# Patient Record
Sex: Male | Born: 1980
Health system: Southern US, Community
[De-identification: ages and names within clinical notes are randomized; demographics above are authoritative.]

---

## 1999-04-25 ENCOUNTER — Emergency Department (HOSPITAL_COMMUNITY): Admission: EM | Admit: 1999-04-25 | Discharge: 1999-04-25 | Payer: Self-pay | Admitting: Emergency Medicine

## 2008-03-07 ENCOUNTER — Emergency Department (HOSPITAL_COMMUNITY): Admission: EM | Admit: 2008-03-07 | Discharge: 2008-03-07 | Payer: Self-pay | Admitting: Emergency Medicine

## 2008-04-14 ENCOUNTER — Emergency Department (HOSPITAL_COMMUNITY): Admission: EM | Admit: 2008-04-14 | Discharge: 2008-04-14 | Payer: Self-pay | Admitting: Emergency Medicine

## 2009-04-04 ENCOUNTER — Emergency Department (HOSPITAL_COMMUNITY): Admission: EM | Admit: 2009-04-04 | Discharge: 2009-04-05 | Payer: Self-pay | Admitting: Emergency Medicine

## 2011-04-08 LAB — URINE MICROSCOPIC-ADD ON

## 2011-04-08 LAB — POCT I-STAT, CHEM 8
BUN: 15 mg/dL (ref 6–23)
Calcium, Ion: 1.12 mmol/L (ref 1.12–1.32)
Creatinine, Ser: 1.4 mg/dL (ref 0.4–1.5)
Glucose, Bld: 93 mg/dL (ref 70–99)
Hemoglobin: 16 g/dL (ref 13.0–17.0)
TCO2: 28 mmol/L (ref 0–100)

## 2011-04-08 LAB — URINALYSIS, ROUTINE W REFLEX MICROSCOPIC
Bilirubin Urine: NEGATIVE
Ketones, ur: NEGATIVE mg/dL
Specific Gravity, Urine: 1.019 (ref 1.005–1.030)
Urobilinogen, UA: 1 mg/dL (ref 0.0–1.0)
pH: 6.5 (ref 5.0–8.0)

## 2011-04-08 LAB — RAPID URINE DRUG SCREEN, HOSP PERFORMED
Amphetamines: NOT DETECTED
Barbiturates: NOT DETECTED
Benzodiazepines: NOT DETECTED
Cocaine: NOT DETECTED
Opiates: NOT DETECTED
Tetrahydrocannabinol: NOT DETECTED

## 2012-06-05 ENCOUNTER — Encounter (HOSPITAL_COMMUNITY): Payer: Self-pay | Admitting: Emergency Medicine

## 2012-06-05 ENCOUNTER — Emergency Department (HOSPITAL_COMMUNITY)
Admission: EM | Admit: 2012-06-05 | Discharge: 2012-06-05 | Disposition: A | Discharge status: Home / Self Care | Payer: Self-pay | Attending: Emergency Medicine | Admitting: Emergency Medicine

## 2012-06-05 DIAGNOSIS — IMO0002 Reserved for concepts with insufficient information to code with codable children: Secondary | ICD-10-CM | POA: Insufficient documentation

## 2012-06-05 DIAGNOSIS — Y998 Other external cause status: Secondary | ICD-10-CM | POA: Insufficient documentation

## 2012-06-05 DIAGNOSIS — Y9239 Other specified sports and athletic area as the place of occurrence of the external cause: Secondary | ICD-10-CM | POA: Insufficient documentation

## 2012-06-05 DIAGNOSIS — S0093XA Contusion of unspecified part of head, initial encounter: Secondary | ICD-10-CM

## 2012-06-05 DIAGNOSIS — W010XXA Fall on same level from slipping, tripping and stumbling without subsequent striking against object, initial encounter: Secondary | ICD-10-CM | POA: Insufficient documentation

## 2012-06-05 DIAGNOSIS — S51809A Unspecified open wound of unspecified forearm, initial encounter: Secondary | ICD-10-CM | POA: Insufficient documentation

## 2012-06-05 DIAGNOSIS — T07XXXA Unspecified multiple injuries, initial encounter: Secondary | ICD-10-CM

## 2012-06-05 DIAGNOSIS — S0003XA Contusion of scalp, initial encounter: Secondary | ICD-10-CM | POA: Insufficient documentation

## 2012-06-05 MED ORDER — HYDROCODONE-ACETAMINOPHEN 5-325 MG PO TABS: 2.0000 | ORAL_TABLET | ORAL | Status: AC | PRN

## 2012-06-05 MED ORDER — HYDROCODONE-ACETAMINOPHEN 5-325 MG PO TABS
1.0000 | ORAL_TABLET | Freq: Once | ORAL | Status: AC
  Administered 2012-06-05: 1 via ORAL
  Filled 2012-06-05: qty 1

## 2012-06-05 NOTE — Discharge Instructions (Signed)
Abrasions Abrasions are skin scrapes. Their treatment depends on how large and deep the abrasion is. Abrasions do not extend through all layers of the skin. A cut or lesion through all skin layers is called a laceration. HOME CARE INSTRUCTIONS   If you were given a dressing, change it at least once a day or as instructed by your caregiver. If the bandage sticks, soak it off with a solution of water or hydrogen peroxide.   Twice a day, wash the area with soap and water to remove all the cream/ointment. You may do this in a sink, under a tub faucet, or in a shower. Rinse off the soap and pat dry with a clean towel. Look for signs of infection (see below).   Reapply cream/ointment according to your caregiver's instruction. This will help prevent infection and keep the bandage from sticking. Telfa or gauze over the wound and under the dressing or wrap will also help keep the bandage from sticking.   If the bandage becomes wet, dirty, or develops a foul smell, change it as soon as possible.   Only take over-the-counter or prescription medicines for pain, discomfort, or fever as directed by your caregiver.  SEEK IMMEDIATE MEDICAL CARE IF:   Increasing pain in the wound.   Signs of infection develop: redness, swelling, surrounding area is tender to touch, or pus coming from the wound.   You have a fever.   Any foul smell coming from the wound or dressing.  Most skin wounds heal within ten days. Facial wounds heal faster. However, an infection may occur despite proper treatment. You should have the wound checked for signs of infection within 24 to 48 hours or sooner if problems arise. If you were not given a wound-check appointment, look closely at the wound yourself on the second day for early signs of infection listed above. MAKE SURE YOU:   Understand these instructions.   Will watch your condition.   Will get help right away if you are not doing well or get worse.  Document Released:  09/23/2005 Document Revised: 12/03/2011 Document Reviewed: 11/17/2011 [facility] Patient Information 2012 [place].Blunt Trauma You have been evaluated for injuries. You have been examined and your caregiver has not found injuries serious enough to require hospitalization. It is common to have multiple bruises and sore muscles following an accident. These tend to feel worse for the first 24 hours. You will feel more stiffness and soreness over the next several hours and worse when you wake up the first morning after your accident. After this point, you should begin to improve with each passing day. The amount of improvement depends on the amount of damage done in the accident. Following your accident, if some part of your body does not work as it should, or if the pain in any area continues to increase, you should return to the Emergency Department for re-evaluation.  HOME CARE INSTRUCTIONS  Routine care for sore areas should include:  Ice to sore areas every 2 hours for 20 minutes while awake for the next 2 days.   Drink extra fluids (not alcohol).   Take a hot or warm shower or bath once or twice a day to increase blood flow to sore muscles. This will help you "limber up".   Activity as tolerated. Lifting may aggravate neck or back pain.   Only take over-the-counter or prescription medicines for pain, discomfort, or fever as directed by your caregiver. Do not use aspirin. This may increase bruising or  increase bleeding if there are small areas where this is happening.  SEEK IMMEDIATE MEDICAL CARE IF:  Numbness, tingling, weakness, or problem with the use of your arms or legs.   A severe headache is not relieved with medications.   There is a change in bowel or bladder control.   Increasing pain in any areas of the body.   Short of breath or dizzy.   Nauseated, vomiting, or sweating.   Increasing belly (abdominal) discomfort.   Blood in urine, stool, or vomiting blood.   Pain  in either shoulder in an area where a shoulder strap would be.   Feelings of lightheadedness or if you have a fainting episode.  Sometimes it is not possible to identify all injuries immediately after the trauma. It is important that you continue to monitor your condition after the emergency department visit. If you feel you are not improving, or improving more slowly than should be expected, call your physician. If you feel your symptoms (problems) are worsening, return to the Emergency Department immediately. Document Released: 09/09/2001 Document Revised: 12/03/2011 Document Reviewed: 08/01/2008 [facility] Patient Information 2012 [place].Blunt Trauma You have been evaluated for injuries. You have been examined and your caregiver has not found injuries serious enough to require hospitalization. It is common to have multiple bruises and sore muscles following an accident. These tend to feel worse for the first 24 hours. You will feel more stiffness and soreness over the next several hours and worse when you wake up the first morning after your accident. After this point, you should begin to improve with each passing day. The amount of improvement depends on the amount of damage done in the accident. Following your accident, if some part of your body does not work as it should, or if the pain in any area continues to increase, you should return to the Emergency Department for re-evaluation.  HOME CARE INSTRUCTIONS  Routine care for sore areas should include:  Ice to sore areas every 2 hours for 20 minutes while awake for the next 2 days.   Drink extra fluids (not alcohol).   Take a hot or warm shower or bath once or twice a day to increase blood flow to sore muscles. This will help you "limber up".   Activity as tolerated. Lifting may aggravate neck or back pain.   Only take over-the-counter or prescription medicines for pain, discomfort, or fever as directed by your caregiver. Do not use  aspirin. This may increase bruising or increase bleeding if there are small areas where this is happening.  SEEK IMMEDIATE MEDICAL CARE IF:  Numbness, tingling, weakness, or problem with the use of your arms or legs.   A severe headache is not relieved with medications.   There is a change in bowel or bladder control.   Increasing pain in any areas of the body.   Short of breath or dizzy.   Nauseated, vomiting, or sweating.   Increasing belly (abdominal) discomfort.   Blood in urine, stool, or vomiting blood.   Pain in either shoulder in an area where a shoulder strap would be.   Feelings of lightheadedness or if you have a fainting episode.  Sometimes it is not possible to identify all injuries immediately after the trauma. It is important that you continue to monitor your condition after the emergency department visit. If you feel you are not improving, or improving more slowly than should be expected, call your physician. If you feel your symptoms (problems) are  worsening, return to the Emergency Department immediately. Document Released: 09/09/2001 Document Revised: 12/03/2011 Document Reviewed: 08/01/2008 [facility] Patient Information 2012 [place]. Your sutures will need to be removed in 10 days.  She [facility] this year.  Given a prescription for pain medication .  Uses for severe pain otherwise, you can take over-the-counter Tylenol or ibuprofen
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
## 2012-06-05 NOTE — ED Provider Notes (Signed)
Medical screening examination/treatment/procedure(s) were performed by non-physician practitioner and as supervising physician I was immediately available for consultation/collaboration.   Hoy Morn, MD 06/05/12 2258
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
## 2012-06-05 NOTE — ED Notes (Signed)
ALP bedside to close wound

## 2019-09-09 ENCOUNTER — Emergency Department (HOSPITAL_COMMUNITY): Payer: Medicaid Other | Admitting: Anesthesiology

## 2019-09-09 ENCOUNTER — Emergency Department (HOSPITAL_COMMUNITY): Payer: Medicaid Other

## 2019-09-09 ENCOUNTER — Inpatient Hospital Stay (HOSPITAL_COMMUNITY)
Admission: EM | Admit: 2019-09-09 | Discharge: 2020-05-02 | DRG: 023 | Disposition: A | Discharge status: Skilled Nursing Facility | Payer: Medicaid Other | Attending: Internal Medicine | Admitting: Internal Medicine

## 2019-09-09 ENCOUNTER — Other Ambulatory Visit: Payer: Self-pay

## 2019-09-09 ENCOUNTER — Inpatient Hospital Stay (HOSPITAL_COMMUNITY): Payer: Medicaid Other

## 2019-09-09 ENCOUNTER — Encounter (HOSPITAL_COMMUNITY)
Admission: EM | Disposition: A | Discharge status: Skilled Nursing Facility | Payer: Self-pay | Source: Home / Self Care | Attending: Internal Medicine

## 2019-09-09 DIAGNOSIS — Z23 Encounter for immunization: Secondary | ICD-10-CM

## 2019-09-09 DIAGNOSIS — S065X9A Traumatic subdural hemorrhage with loss of consciousness of unspecified duration, initial encounter: Secondary | ICD-10-CM | POA: Diagnosis present

## 2019-09-09 DIAGNOSIS — N289 Disorder of kidney and ureter, unspecified: Secondary | ICD-10-CM | POA: Diagnosis not present

## 2019-09-09 DIAGNOSIS — K5900 Constipation, unspecified: Secondary | ICD-10-CM | POA: Diagnosis not present

## 2019-09-09 DIAGNOSIS — R5381 Other malaise: Secondary | ICD-10-CM | POA: Diagnosis not present

## 2019-09-09 DIAGNOSIS — J9602 Acute respiratory failure with hypercapnia: Secondary | ICD-10-CM | POA: Diagnosis present

## 2019-09-09 DIAGNOSIS — J9601 Acute respiratory failure with hypoxia: Secondary | ICD-10-CM | POA: Diagnosis present

## 2019-09-09 DIAGNOSIS — R569 Unspecified convulsions: Secondary | ICD-10-CM

## 2019-09-09 DIAGNOSIS — N179 Acute kidney failure, unspecified: Secondary | ICD-10-CM | POA: Diagnosis not present

## 2019-09-09 DIAGNOSIS — F1020 Alcohol dependence, uncomplicated: Secondary | ICD-10-CM | POA: Diagnosis present

## 2019-09-09 DIAGNOSIS — G932 Benign intracranial hypertension: Secondary | ICD-10-CM | POA: Diagnosis not present

## 2019-09-09 DIAGNOSIS — J95851 Ventilator associated pneumonia: Secondary | ICD-10-CM | POA: Diagnosis not present

## 2019-09-09 DIAGNOSIS — Z4659 Encounter for fitting and adjustment of other gastrointestinal appliance and device: Secondary | ICD-10-CM

## 2019-09-09 DIAGNOSIS — G8191 Hemiplegia, unspecified affecting right dominant side: Secondary | ICD-10-CM | POA: Diagnosis not present

## 2019-09-09 DIAGNOSIS — I639 Cerebral infarction, unspecified: Secondary | ICD-10-CM | POA: Diagnosis not present

## 2019-09-09 DIAGNOSIS — E162 Hypoglycemia, unspecified: Secondary | ICD-10-CM | POA: Diagnosis not present

## 2019-09-09 DIAGNOSIS — R4701 Aphasia: Secondary | ICD-10-CM | POA: Diagnosis present

## 2019-09-09 DIAGNOSIS — D62 Acute posthemorrhagic anemia: Secondary | ICD-10-CM | POA: Diagnosis present

## 2019-09-09 DIAGNOSIS — J9600 Acute respiratory failure, unspecified whether with hypoxia or hypercapnia: Secondary | ICD-10-CM

## 2019-09-09 DIAGNOSIS — I612 Nontraumatic intracerebral hemorrhage in hemisphere, unspecified: Secondary | ICD-10-CM | POA: Diagnosis not present

## 2019-09-09 DIAGNOSIS — F10239 Alcohol dependence with withdrawal, unspecified: Secondary | ICD-10-CM | POA: Diagnosis not present

## 2019-09-09 DIAGNOSIS — R4020 Unspecified coma: Secondary | ICD-10-CM | POA: Diagnosis present

## 2019-09-09 DIAGNOSIS — I629 Nontraumatic intracranial hemorrhage, unspecified: Secondary | ICD-10-CM | POA: Diagnosis present

## 2019-09-09 DIAGNOSIS — Z7189 Other specified counseling: Secondary | ICD-10-CM

## 2019-09-09 DIAGNOSIS — Z931 Gastrostomy status: Secondary | ICD-10-CM

## 2019-09-09 DIAGNOSIS — Z9911 Dependence on respirator [ventilator] status: Secondary | ICD-10-CM

## 2019-09-09 DIAGNOSIS — R739 Hyperglycemia, unspecified: Secondary | ICD-10-CM | POA: Diagnosis not present

## 2019-09-09 DIAGNOSIS — I129 Hypertensive chronic kidney disease with stage 1 through stage 4 chronic kidney disease, or unspecified chronic kidney disease: Secondary | ICD-10-CM | POA: Diagnosis present

## 2019-09-09 DIAGNOSIS — N136 Pyonephrosis: Secondary | ICD-10-CM | POA: Diagnosis not present

## 2019-09-09 DIAGNOSIS — Z515 Encounter for palliative care: Secondary | ICD-10-CM

## 2019-09-09 DIAGNOSIS — Z681 Body mass index (BMI) 19 or less, adult: Secondary | ICD-10-CM | POA: Diagnosis not present

## 2019-09-09 DIAGNOSIS — G936 Cerebral edema: Secondary | ICD-10-CM | POA: Diagnosis present

## 2019-09-09 DIAGNOSIS — J9690 Respiratory failure, unspecified, unspecified whether with hypoxia or hypercapnia: Secondary | ICD-10-CM

## 2019-09-09 DIAGNOSIS — E875 Hyperkalemia: Secondary | ICD-10-CM | POA: Diagnosis not present

## 2019-09-09 DIAGNOSIS — I161 Hypertensive emergency: Secondary | ICD-10-CM | POA: Diagnosis present

## 2019-09-09 DIAGNOSIS — R1310 Dysphagia, unspecified: Secondary | ICD-10-CM | POA: Diagnosis present

## 2019-09-09 DIAGNOSIS — I619 Nontraumatic intracerebral hemorrhage, unspecified: Secondary | ICD-10-CM

## 2019-09-09 DIAGNOSIS — R32 Unspecified urinary incontinence: Secondary | ICD-10-CM | POA: Diagnosis present

## 2019-09-09 DIAGNOSIS — B961 Klebsiella pneumoniae [K. pneumoniae] as the cause of diseases classified elsewhere: Secondary | ICD-10-CM | POA: Diagnosis not present

## 2019-09-09 DIAGNOSIS — S065XAA Traumatic subdural hemorrhage with loss of consciousness status unknown, initial encounter: Secondary | ICD-10-CM | POA: Diagnosis present

## 2019-09-09 DIAGNOSIS — E43 Unspecified severe protein-calorie malnutrition: Secondary | ICD-10-CM | POA: Diagnosis not present

## 2019-09-09 DIAGNOSIS — I610 Nontraumatic intracerebral hemorrhage in hemisphere, subcortical: Secondary | ICD-10-CM

## 2019-09-09 DIAGNOSIS — J690 Pneumonitis due to inhalation of food and vomit: Secondary | ICD-10-CM | POA: Diagnosis not present

## 2019-09-09 DIAGNOSIS — Z978 Presence of other specified devices: Secondary | ICD-10-CM

## 2019-09-09 DIAGNOSIS — Z590 Homelessness: Secondary | ICD-10-CM

## 2019-09-09 DIAGNOSIS — Z751 Person awaiting admission to adequate facility elsewhere: Secondary | ICD-10-CM

## 2019-09-09 DIAGNOSIS — K9423 Gastrostomy malfunction: Secondary | ICD-10-CM

## 2019-09-09 DIAGNOSIS — R109 Unspecified abdominal pain: Secondary | ICD-10-CM

## 2019-09-09 DIAGNOSIS — Z01818 Encounter for other preprocedural examination: Secondary | ICD-10-CM

## 2019-09-09 DIAGNOSIS — F17200 Nicotine dependence, unspecified, uncomplicated: Secondary | ICD-10-CM | POA: Diagnosis present

## 2019-09-09 DIAGNOSIS — B9689 Other specified bacterial agents as the cause of diseases classified elsewhere: Secondary | ICD-10-CM | POA: Diagnosis not present

## 2019-09-09 DIAGNOSIS — R633 Feeding difficulties: Secondary | ICD-10-CM | POA: Diagnosis not present

## 2019-09-09 DIAGNOSIS — Z7401 Bed confinement status: Secondary | ICD-10-CM

## 2019-09-09 DIAGNOSIS — D631 Anemia in chronic kidney disease: Secondary | ICD-10-CM | POA: Diagnosis present

## 2019-09-09 DIAGNOSIS — E870 Hyperosmolality and hypernatremia: Secondary | ICD-10-CM | POA: Diagnosis present

## 2019-09-09 DIAGNOSIS — N184 Chronic kidney disease, stage 4 (severe): Secondary | ICD-10-CM | POA: Diagnosis not present

## 2019-09-09 DIAGNOSIS — E872 Acidosis: Secondary | ICD-10-CM | POA: Diagnosis present

## 2019-09-09 DIAGNOSIS — Z20822 Contact with and (suspected) exposure to covid-19: Secondary | ICD-10-CM | POA: Diagnosis present

## 2019-09-09 DIAGNOSIS — R0989 Other specified symptoms and signs involving the circulatory and respiratory systems: Secondary | ICD-10-CM

## 2019-09-09 DIAGNOSIS — I10 Essential (primary) hypertension: Secondary | ICD-10-CM | POA: Diagnosis not present

## 2019-09-09 DIAGNOSIS — E876 Hypokalemia: Secondary | ICD-10-CM | POA: Diagnosis not present

## 2019-09-09 DIAGNOSIS — G935 Compression of brain: Secondary | ICD-10-CM | POA: Diagnosis present

## 2019-09-09 DIAGNOSIS — E860 Dehydration: Secondary | ICD-10-CM | POA: Diagnosis not present

## 2019-09-09 DIAGNOSIS — R159 Full incontinence of feces: Secondary | ICD-10-CM | POA: Diagnosis present

## 2019-09-09 DIAGNOSIS — R509 Fever, unspecified: Secondary | ICD-10-CM

## 2019-09-09 DIAGNOSIS — D696 Thrombocytopenia, unspecified: Secondary | ICD-10-CM | POA: Diagnosis present

## 2019-09-09 DIAGNOSIS — M2550 Pain in unspecified joint: Secondary | ICD-10-CM | POA: Diagnosis not present

## 2019-09-09 DIAGNOSIS — I615 Nontraumatic intracerebral hemorrhage, intraventricular: Principal | ICD-10-CM | POA: Diagnosis present

## 2019-09-09 DIAGNOSIS — Z9114 Patient's other noncompliance with medication regimen: Secondary | ICD-10-CM

## 2019-09-09 DIAGNOSIS — I611 Nontraumatic intracerebral hemorrhage in hemisphere, cortical: Secondary | ICD-10-CM | POA: Diagnosis not present

## 2019-09-09 DIAGNOSIS — R0602 Shortness of breath: Secondary | ICD-10-CM

## 2019-09-09 DIAGNOSIS — Z9119 Patient's noncompliance with other medical treatment and regimen: Secondary | ICD-10-CM

## 2019-09-09 DIAGNOSIS — R1110 Vomiting, unspecified: Secondary | ICD-10-CM

## 2019-09-09 DIAGNOSIS — D649 Anemia, unspecified: Secondary | ICD-10-CM | POA: Diagnosis not present

## 2019-09-09 HISTORY — PX: CRANIOTOMY: SHX93

## 2019-09-09 LAB — POCT I-STAT 7, (LYTES, BLD GAS, ICA,H+H)
Acid-Base Excess: 5 mmol/L — ABNORMAL HIGH (ref 0.0–2.0)
Bicarbonate: 30.3 mmol/L — ABNORMAL HIGH (ref 20.0–28.0)
Calcium, Ion: 1.13 mmol/L — ABNORMAL LOW (ref 1.15–1.40)
HCT: 43 % (ref 39.0–52.0)
Hemoglobin: 14.6 g/dL (ref 13.0–17.0)
O2 Saturation: 100 %
Patient temperature: 98.7
Potassium: 2.1 mmol/L — CL (ref 3.5–5.1)
Sodium: 139 mmol/L (ref 135–145)
TCO2: 32 mmol/L (ref 22–32)
pCO2 arterial: 44.4 mmHg (ref 32.0–48.0)
pH, Arterial: 7.443 (ref 7.350–7.450)
pO2, Arterial: 592 mmHg — ABNORMAL HIGH (ref 83.0–108.0)

## 2019-09-09 LAB — DIFFERENTIAL
Abs Immature Granulocytes: 0.02 10*3/uL (ref 0.00–0.07)
Basophils Absolute: 0.1 10*3/uL (ref 0.0–0.1)
Basophils Relative: 1 %
Eosinophils Absolute: 0.6 10*3/uL — ABNORMAL HIGH (ref 0.0–0.5)
Eosinophils Relative: 5 %
Immature Granulocytes: 0 %
Lymphocytes Relative: 54 %
Lymphs Abs: 6 10*3/uL — ABNORMAL HIGH (ref 0.7–4.0)
Monocytes Absolute: 0.8 10*3/uL (ref 0.1–1.0)
Monocytes Relative: 8 %
Neutro Abs: 3.6 10*3/uL (ref 1.7–7.7)
Neutrophils Relative %: 32 %

## 2019-09-09 LAB — BASIC METABOLIC PANEL
Anion gap: 12 (ref 5–15)
BUN: 21 mg/dL — ABNORMAL HIGH (ref 6–20)
CO2: 20 mmol/L — ABNORMAL LOW (ref 22–32)
Calcium: 8.1 mg/dL — ABNORMAL LOW (ref 8.9–10.3)
Chloride: 104 mmol/L (ref 98–111)
Creatinine, Ser: 2.08 mg/dL — ABNORMAL HIGH (ref 0.61–1.24)
GFR calc Af Amer: 45 mL/min — ABNORMAL LOW (ref >60)
GFR calc non Af Amer: 39 mL/min — ABNORMAL LOW (ref >60)
Glucose, Bld: 99 mg/dL (ref 70–99)
Potassium: 3.9 mmol/L (ref 3.5–5.1)
Sodium: 136 mmol/L (ref 135–145)

## 2019-09-09 LAB — BLOOD GAS, ARTERIAL
Acid-base deficit: 4 mmol/L — ABNORMAL HIGH (ref 0.0–2.0)
Bicarbonate: 19.7 mmol/L — ABNORMAL LOW (ref 20.0–28.0)
Drawn by: 213381
FIO2: 0.4
MECHVT: 560 mL
O2 Saturation: 99.3 %
PEEP: 5 cmH2O
Patient temperature: 98.6
RATE: 18 resp/min
pCO2 arterial: 31.1 mmHg — ABNORMAL LOW (ref 32.0–48.0)
pH, Arterial: 7.418 (ref 7.350–7.450)
pO2, Arterial: 202 mmHg — ABNORMAL HIGH (ref 83.0–108.0)

## 2019-09-09 LAB — I-STAT CHEM 8, ED
BUN: 23 mg/dL — ABNORMAL HIGH (ref 6–20)
Calcium, Ion: 1.01 mmol/L — ABNORMAL LOW (ref 1.15–1.40)
Chloride: 104 mmol/L (ref 98–111)
Creatinine, Ser: 2.1 mg/dL — ABNORMAL HIGH (ref 0.61–1.24)
Glucose, Bld: 161 mg/dL — ABNORMAL HIGH (ref 70–99)
HCT: 46 % (ref 39.0–52.0)
Hemoglobin: 15.6 g/dL (ref 13.0–17.0)
Potassium: 2.4 mmol/L — CL (ref 3.5–5.1)
Sodium: 140 mmol/L (ref 135–145)
TCO2: 22 mmol/L (ref 22–32)

## 2019-09-09 LAB — CBC
HCT: 43.6 % (ref 39.0–52.0)
Hemoglobin: 14.5 g/dL (ref 13.0–17.0)
MCH: 29.5 pg (ref 26.0–34.0)
MCHC: 33.3 g/dL (ref 30.0–36.0)
MCV: 88.8 fL (ref 80.0–100.0)
Platelets: 204 10*3/uL (ref 150–400)
RBC: 4.91 MIL/uL (ref 4.22–5.81)
RDW: 13.8 % (ref 11.5–15.5)
WBC: 11.1 10*3/uL — ABNORMAL HIGH (ref 4.0–10.5)
nRBC: 0 % (ref 0.0–0.2)

## 2019-09-09 LAB — SARS CORONAVIRUS 2 BY RT PCR (HOSPITAL ORDER, PERFORMED IN ~~LOC~~ HOSPITAL LAB): SARS Coronavirus 2: NEGATIVE

## 2019-09-09 LAB — GLUCOSE, CAPILLARY
Glucose-Capillary: 166 mg/dL — ABNORMAL HIGH (ref 70–99)
Glucose-Capillary: 216 mg/dL — ABNORMAL HIGH (ref 70–99)

## 2019-09-09 LAB — PROTIME-INR
INR: 0.9 (ref 0.8–1.2)
Prothrombin Time: 12.5 seconds (ref 11.4–15.2)

## 2019-09-09 LAB — MAGNESIUM: Magnesium: 1.9 mg/dL (ref 1.7–2.4)

## 2019-09-09 LAB — COMPREHENSIVE METABOLIC PANEL
ALT: 17 U/L (ref 0–44)
AST: 23 U/L (ref 15–41)
Albumin: 4.1 g/dL (ref 3.5–5.0)
Alkaline Phosphatase: 73 U/L (ref 38–126)
Anion gap: 15 (ref 5–15)
BUN: 22 mg/dL — ABNORMAL HIGH (ref 6–20)
CO2: 22 mmol/L (ref 22–32)
Calcium: 9.2 mg/dL (ref 8.9–10.3)
Chloride: 103 mmol/L (ref 98–111)
Creatinine, Ser: 2.29 mg/dL — ABNORMAL HIGH (ref 0.61–1.24)
GFR calc Af Amer: 40 mL/min — ABNORMAL LOW (ref >60)
GFR calc non Af Amer: 35 mL/min — ABNORMAL LOW (ref >60)
Glucose, Bld: 165 mg/dL — ABNORMAL HIGH (ref 70–99)
Potassium: 2.4 mmol/L — CL (ref 3.5–5.1)
Sodium: 140 mmol/L (ref 135–145)
Total Bilirubin: 0.4 mg/dL (ref 0.3–1.2)
Total Protein: 7.6 g/dL (ref 6.5–8.1)

## 2019-09-09 LAB — RAPID URINE DRUG SCREEN, HOSP PERFORMED
Amphetamines: NOT DETECTED
Barbiturates: NOT DETECTED
Benzodiazepines: NOT DETECTED
Cocaine: NOT DETECTED
Opiates: NOT DETECTED
Tetrahydrocannabinol: NOT DETECTED

## 2019-09-09 LAB — MRSA PCR SCREENING: MRSA by PCR: NEGATIVE

## 2019-09-09 LAB — APTT: aPTT: 29 seconds (ref 24–36)

## 2019-09-09 LAB — ETHANOL: Alcohol, Ethyl (B): <10 mg/dL (ref <10)

## 2019-09-09 LAB — PHOSPHORUS: Phosphorus: 2.2 mg/dL — ABNORMAL LOW (ref 2.5–4.6)

## 2019-09-09 LAB — TRIGLYCERIDES: Triglycerides: 252 mg/dL — ABNORMAL HIGH (ref <150)

## 2019-09-09 SURGERY — CRANIOTOMY HEMATOMA EVACUATION SUBDURAL
Anesthesia: General | Site: Head | Laterality: Left

## 2019-09-09 MED ORDER — ROCURONIUM BROMIDE 10 MG/ML (PF) SYRINGE
PREFILLED_SYRINGE | INTRAVENOUS | Status: AC
  Filled 2019-09-09: qty 20

## 2019-09-09 MED ORDER — FENTANYL CITRATE (PF) 100 MCG/2ML IJ SOLN
100.0000 ug | INTRAMUSCULAR | Status: DC | PRN
  Administered 2019-09-09 – 2019-09-10 (×7): 100 ug via INTRAVENOUS
  Filled 2019-09-09 (×7): qty 2

## 2019-09-09 MED ORDER — BACITRACIN ZINC 500 UNIT/GM EX OINT
TOPICAL_OINTMENT | CUTANEOUS | Status: AC
  Filled 2019-09-09: qty 28.35

## 2019-09-09 MED ORDER — CEFAZOLIN SODIUM 1 G IJ SOLR
INTRAMUSCULAR | Status: AC
  Filled 2019-09-09: qty 20

## 2019-09-09 MED ORDER — SODIUM CHLORIDE 0.9 % IV SOLN
INTRAVENOUS | Status: DC | PRN
  Administered 2019-09-09: 05:00:00 via INTRAVENOUS

## 2019-09-09 MED ORDER — FENTANYL CITRATE (PF) 100 MCG/2ML IJ SOLN
INTRAMUSCULAR | Status: DC | PRN
  Administered 2019-09-09 (×5): 50 ug via INTRAVENOUS

## 2019-09-09 MED ORDER — CHLORHEXIDINE GLUCONATE CLOTH 2 % EX PADS
6.0000 | MEDICATED_PAD | Freq: Every day | CUTANEOUS | Status: DC
  Administered 2019-09-09 – 2019-09-11 (×3): 6 via TOPICAL

## 2019-09-09 MED ORDER — BUPIVACAINE-EPINEPHRINE (PF) 0.5% -1:200000 IJ SOLN
INTRAMUSCULAR | Status: AC
  Filled 2019-09-09: qty 30

## 2019-09-09 MED ORDER — ACETAMINOPHEN 650 MG RE SUPP: 650.0000 mg | RECTAL | Status: DC | PRN

## 2019-09-09 MED ORDER — THROMBIN 5000 UNITS EX SOLR
OROMUCOSAL | Status: DC | PRN
  Administered 2019-09-09: 06:00:00 5 mL via TOPICAL

## 2019-09-09 MED ORDER — FOLIC ACID 5 MG/ML IJ SOLN
1.0000 mg | Freq: Every day | INTRAMUSCULAR | Status: DC
  Administered 2019-09-09 – 2019-09-11 (×3): 1 mg via INTRAVENOUS
  Filled 2019-09-09 (×3): qty 0.2

## 2019-09-09 MED ORDER — MICROFIBRILLAR COLL HEMOSTAT EX PADS
MEDICATED_PAD | CUTANEOUS | Status: DC | PRN
  Administered 2019-09-09: 1 via TOPICAL

## 2019-09-09 MED ORDER — POTASSIUM CHLORIDE IN NACL 20-0.9 MEQ/L-% IV SOLN
INTRAVENOUS | Status: DC
  Administered 2019-09-09 – 2019-09-10 (×2): via INTRAVENOUS
  Filled 2019-09-09 (×2): qty 1000

## 2019-09-09 MED ORDER — ORAL CARE MOUTH RINSE
15.0000 mL | OROMUCOSAL | Status: DC
  Administered 2019-09-09 – 2019-11-08 (×522): 15 mL via OROMUCOSAL

## 2019-09-09 MED ORDER — AMLODIPINE BESYLATE 10 MG PO TABS
10.0000 mg | ORAL_TABLET | Freq: Every day | ORAL | Status: DC
  Administered 2019-09-09 – 2020-05-02 (×217): 10 mg
  Filled 2019-09-09 (×56): qty 1
  Filled 2019-09-09: qty 4
  Filled 2019-09-09 (×7): qty 1
  Filled 2019-09-09: qty 2
  Filled 2019-09-09 (×40): qty 1
  Filled 2019-09-09: qty 2
  Filled 2019-09-09 (×22): qty 1
  Filled 2019-09-09: qty 2
  Filled 2019-09-09 (×101): qty 1

## 2019-09-09 MED ORDER — CEFAZOLIN SODIUM-DEXTROSE 2-4 GM/100ML-% IV SOLN
2.0000 g | Freq: Three times a day (TID) | INTRAVENOUS | Status: AC
  Administered 2019-09-09 (×2): 2 g via INTRAVENOUS
  Filled 2019-09-09 (×2): qty 100

## 2019-09-09 MED ORDER — BUPIVACAINE HCL (PF) 0.5 % IJ SOLN
INTRAMUSCULAR | Status: DC | PRN
  Administered 2019-09-09: 2 mL
  Administered 2019-09-09: 10 mL

## 2019-09-09 MED ORDER — LACTATED RINGERS IV BOLUS
2000.0000 mL | Freq: Once | INTRAVENOUS | Status: AC
  Administered 2019-09-09: 2000 mL via INTRAVENOUS

## 2019-09-09 MED ORDER — POTASSIUM CHLORIDE 10 MEQ/100ML IV SOLN
10.0000 meq | INTRAVENOUS | Status: DC
  Administered 2019-09-09 (×5): 10 meq via INTRAVENOUS
  Filled 2019-09-09 (×5): qty 100

## 2019-09-09 MED ORDER — FENTANYL CITRATE (PF) 100 MCG/2ML IJ SOLN
100.0000 ug | INTRAMUSCULAR | Status: AC | PRN
  Administered 2019-09-11 – 2019-09-12 (×3): 100 ug via INTRAVENOUS
  Filled 2019-09-09 (×3): qty 2

## 2019-09-09 MED ORDER — THROMBIN 20000 UNITS EX SOLR
CUTANEOUS | Status: AC
  Filled 2019-09-09: qty 20000

## 2019-09-09 MED ORDER — LIDOCAINE-EPINEPHRINE 1 %-1:100000 IJ SOLN
INTRAMUSCULAR | Status: AC
  Filled 2019-09-09: qty 1

## 2019-09-09 MED ORDER — THROMBIN 20000 UNITS EX SOLR
CUTANEOUS | Status: DC | PRN
  Administered 2019-09-09: 06:00:00 20 mL via TOPICAL

## 2019-09-09 MED ORDER — LIDOCAINE 2% (20 MG/ML) 5 ML SYRINGE
INTRAMUSCULAR | Status: AC
  Filled 2019-09-09: qty 5

## 2019-09-09 MED ORDER — VITAL HIGH PROTEIN PO LIQD
1000.0000 mL | ORAL | Status: DC
  Administered 2019-09-09: 1000 mL

## 2019-09-09 MED ORDER — PROPOFOL 10 MG/ML IV BOLUS
INTRAVENOUS | Status: DC | PRN
  Administered 2019-09-09: 60 mg via INTRAVENOUS

## 2019-09-09 MED ORDER — HYDROCODONE-ACETAMINOPHEN 5-325 MG PO TABS: 1.0000 | ORAL_TABLET | ORAL | Status: DC | PRN

## 2019-09-09 MED ORDER — SODIUM CHLORIDE 0.9 % IV SOLN
2000.0000 mg | Freq: Once | INTRAVENOUS | Status: AC
  Administered 2019-09-09: 04:00:00 2000 mg via INTRAVENOUS
  Filled 2019-09-09: qty 20

## 2019-09-09 MED ORDER — FENTANYL CITRATE (PF) 250 MCG/5ML IJ SOLN
INTRAMUSCULAR | Status: AC
  Filled 2019-09-09: qty 5

## 2019-09-09 MED ORDER — ACETAMINOPHEN 325 MG PO TABS
650.0000 mg | ORAL_TABLET | ORAL | Status: DC | PRN
  Administered 2019-09-09 – 2019-09-11 (×5): 650 mg via ORAL
  Filled 2019-09-09 (×6): qty 2

## 2019-09-09 MED ORDER — LORAZEPAM 2 MG/ML IJ SOLN
INTRAMUSCULAR | Status: AC
  Administered 2019-09-09: 2 mg
  Filled 2019-09-09: qty 1

## 2019-09-09 MED ORDER — 0.9 % SODIUM CHLORIDE (POUR BTL) OPTIME
TOPICAL | Status: DC | PRN
  Administered 2019-09-09 (×4): 1000 mL

## 2019-09-09 MED ORDER — THIAMINE HCL 100 MG/ML IJ SOLN
100.0000 mg | Freq: Every day | INTRAMUSCULAR | Status: DC
  Administered 2019-09-09 – 2019-09-11 (×3): 100 mg via INTRAVENOUS
  Filled 2019-09-09 (×3): qty 2

## 2019-09-09 MED ORDER — HYDROGEN PEROXIDE 3 % EX SOLN
CUTANEOUS | Status: DC | PRN
  Administered 2019-09-09: 1

## 2019-09-09 MED ORDER — PRO-STAT SUGAR FREE PO LIQD
30.0000 mL | Freq: Two times a day (BID) | ORAL | Status: DC
  Administered 2019-09-09: 15:00:00 30 mL
  Filled 2019-09-09: qty 30

## 2019-09-09 MED ORDER — MAGNESIUM SULFATE 2 GM/50ML IV SOLN
2.0000 g | Freq: Once | INTRAVENOUS | Status: AC
  Administered 2019-09-09: 2 g via INTRAVENOUS
  Filled 2019-09-09: qty 50

## 2019-09-09 MED ORDER — CLEVIDIPINE BUTYRATE 0.5 MG/ML IV EMUL
0.0000 mg/h | INTRAVENOUS | Status: DC
  Administered 2019-09-09: 21 mg/h via INTRAVENOUS
  Administered 2019-09-09: 18 mg/h via INTRAVENOUS
  Administered 2019-09-09: 21:00:00 21 mg/h via INTRAVENOUS
  Administered 2019-09-09: 2 mg/h via INTRAVENOUS
  Administered 2019-09-09 (×2): 21 mg/h via INTRAVENOUS
  Administered 2019-09-09: 10:00:00 3 mg/h via INTRAVENOUS
  Administered 2019-09-10 (×6): 21 mg/h via INTRAVENOUS
  Administered 2019-09-10: 13:00:00 18 mg/h via INTRAVENOUS
  Administered 2019-09-10 – 2019-09-11 (×8): 21 mg/h via INTRAVENOUS
  Administered 2019-09-11: 20 mg/h via INTRAVENOUS
  Administered 2019-09-11 (×3): 21 mg/h via INTRAVENOUS
  Administered 2019-09-12: 12:00:00 20 mg/h via INTRAVENOUS
  Administered 2019-09-12: 07:00:00 21 mg/h via INTRAVENOUS
  Administered 2019-09-12: 20 mg/h via INTRAVENOUS
  Administered 2019-09-12: 16:00:00 17 mg/h via INTRAVENOUS
  Administered 2019-09-12: 18 mg/h via INTRAVENOUS
  Administered 2019-09-12: 10:00:00 21 mg/h via INTRAVENOUS
  Administered 2019-09-12: 19 mg/h via INTRAVENOUS
  Administered 2019-09-12 (×2): 17 mg/h via INTRAVENOUS
  Filled 2019-09-09 (×33): qty 50

## 2019-09-09 MED ORDER — PROPOFOL 1000 MG/100ML IV EMUL
0.0000 ug/kg/min | INTRAVENOUS | Status: DC
  Administered 2019-09-09: 10 ug/kg/min via INTRAVENOUS

## 2019-09-09 MED ORDER — CHLORHEXIDINE GLUCONATE 0.12% ORAL RINSE (MEDLINE KIT)
15.0000 mL | Freq: Two times a day (BID) | OROMUCOSAL | Status: DC
  Administered 2019-09-09 – 2019-11-08 (×111): 15 mL via OROMUCOSAL

## 2019-09-09 MED ORDER — THROMBIN (RECOMBINANT) 20000 UNITS EX SOLR
CUTANEOUS | Status: AC
  Filled 2019-09-09: qty 20000

## 2019-09-09 MED ORDER — MIDAZOLAM HCL 2 MG/2ML IJ SOLN
2.0000 mg | INTRAMUSCULAR | Status: DC | PRN
  Administered 2019-09-10 – 2019-09-12 (×12): 2 mg via INTRAVENOUS
  Filled 2019-09-09 (×12): qty 2

## 2019-09-09 MED ORDER — PROPOFOL 500 MG/50ML IV EMUL
INTRAVENOUS | Status: DC | PRN
  Administered 2019-09-09: 75 ug/kg/min via INTRAVENOUS

## 2019-09-09 MED ORDER — ONDANSETRON HCL 4 MG/2ML IJ SOLN
4.0000 mg | INTRAMUSCULAR | Status: DC | PRN
  Administered 2019-09-28: 08:00:00 4 mg via INTRAVENOUS

## 2019-09-09 MED ORDER — ARTIFICIAL TEARS OPHTHALMIC OINT
TOPICAL_OINTMENT | OPHTHALMIC | Status: AC
  Filled 2019-09-09: qty 3.5

## 2019-09-09 MED ORDER — FLEET ENEMA 7-19 GM/118ML RE ENEM: 1.0000 | ENEMA | Freq: Once | RECTAL | Status: DC | PRN

## 2019-09-09 MED ORDER — LIDOCAINE-EPINEPHRINE 1 %-1:100000 IJ SOLN
INTRAMUSCULAR | Status: DC | PRN
  Administered 2019-09-09: 10 mL via INTRADERMAL
  Administered 2019-09-09: 2 mL via INTRADERMAL

## 2019-09-09 MED ORDER — CLEVIDIPINE BUTYRATE 0.5 MG/ML IV EMUL
INTRAVENOUS | Status: AC
  Filled 2019-09-09: qty 50

## 2019-09-09 MED ORDER — LEVETIRACETAM IN NACL 500 MG/100ML IV SOLN
500.0000 mg | Freq: Two times a day (BID) | INTRAVENOUS | Status: DC
  Administered 2019-09-09 (×2): 500 mg via INTRAVENOUS
  Filled 2019-09-09 (×2): qty 100

## 2019-09-09 MED ORDER — POLYETHYLENE GLYCOL 3350 17 G PO PACK
17.0000 g | PACK | Freq: Every day | ORAL | Status: DC | PRN
  Administered 2019-10-15 – 2020-02-29 (×3): 17 g via ORAL
  Filled 2019-09-09 (×2): qty 1

## 2019-09-09 MED ORDER — THROMBIN 5000 UNITS EX SOLR
CUTANEOUS | Status: AC
  Filled 2019-09-09: qty 5000

## 2019-09-09 MED ORDER — PHENYLEPHRINE 40 MCG/ML (10ML) SYRINGE FOR IV PUSH (FOR BLOOD PRESSURE SUPPORT)
PREFILLED_SYRINGE | INTRAVENOUS | Status: AC
  Filled 2019-09-09: qty 10

## 2019-09-09 MED ORDER — BUPIVACAINE HCL (PF) 0.5 % IJ SOLN
INTRAMUSCULAR | Status: AC
  Filled 2019-09-09: qty 30

## 2019-09-09 MED ORDER — SODIUM CHLORIDE 0.9% FLUSH: 3.0000 mL | Freq: Once | INTRAVENOUS | Status: DC

## 2019-09-09 MED ORDER — ROCURONIUM BROMIDE 100 MG/10ML IV SOLN
INTRAVENOUS | Status: DC | PRN
  Administered 2019-09-09: 50 mg via INTRAVENOUS
  Administered 2019-09-09 (×2): 100 mg via INTRAVENOUS

## 2019-09-09 MED ORDER — MIDAZOLAM HCL 2 MG/2ML IJ SOLN: 2.0000 mg | INTRAMUSCULAR | Status: DC | PRN

## 2019-09-09 MED ORDER — EPHEDRINE 5 MG/ML INJ
INTRAVENOUS | Status: AC
  Filled 2019-09-09: qty 10

## 2019-09-09 MED ORDER — SODIUM CHLORIDE (PF) 0.9 % IJ SOLN
INTRAMUSCULAR | Status: AC
  Filled 2019-09-09: qty 10

## 2019-09-09 MED ORDER — BISACODYL 10 MG RE SUPP
10.0000 mg | Freq: Every day | RECTAL | Status: DC | PRN
  Administered 2019-10-10 – 2019-11-25 (×2): 10 mg via RECTAL
  Filled 2019-09-09 (×3): qty 1

## 2019-09-09 MED ORDER — PROMETHAZINE HCL 25 MG PO TABS
12.5000 mg | ORAL_TABLET | ORAL | Status: DC | PRN
  Administered 2020-01-07: 25 mg via ORAL
  Filled 2019-09-09: qty 1

## 2019-09-09 MED ORDER — ONDANSETRON HCL 4 MG PO TABS: 4.0000 mg | ORAL_TABLET | ORAL | Status: DC | PRN

## 2019-09-09 MED ORDER — SODIUM CHLORIDE 0.9 % IV SOLN
INTRAVENOUS | Status: DC | PRN
  Administered 2019-09-09: 40 ug/min via INTRAVENOUS

## 2019-09-09 MED ORDER — THROMBIN (RECOMBINANT) 5000 UNITS EX SOLR
CUTANEOUS | Status: AC
  Filled 2019-09-09: qty 5000

## 2019-09-09 MED ORDER — PANTOPRAZOLE SODIUM 40 MG IV SOLR
40.0000 mg | Freq: Every day | INTRAVENOUS | Status: DC
  Administered 2019-09-09 – 2019-09-12 (×4): 40 mg via INTRAVENOUS
  Filled 2019-09-09 (×4): qty 40

## 2019-09-09 MED ORDER — MANNITOL 25 % IV SOLN
25.0000 g | Freq: Once | INTRAVENOUS | Status: AC
  Administered 2019-09-09: 50 g via INTRAVENOUS
  Filled 2019-09-09 (×2): qty 100

## 2019-09-09 MED ORDER — PROPOFOL 10 MG/ML IV BOLUS
INTRAVENOUS | Status: AC
  Filled 2019-09-09: qty 20

## 2019-09-09 MED ORDER — VITAL HIGH PROTEIN PO LIQD
1000.0000 mL | ORAL | Status: DC
  Administered 2019-09-09 – 2019-09-13 (×4): 1000 mL

## 2019-09-09 MED ORDER — SUCCINYLCHOLINE CHLORIDE 200 MG/10ML IV SOSY
PREFILLED_SYRINGE | INTRAVENOUS | Status: AC
  Filled 2019-09-09: qty 10

## 2019-09-09 MED ORDER — CEFAZOLIN SODIUM-DEXTROSE 2-3 GM-%(50ML) IV SOLR
INTRAVENOUS | Status: DC | PRN
  Administered 2019-09-09: 2 g via INTRAVENOUS

## 2019-09-09 MED ORDER — MORPHINE SULFATE (PF) 2 MG/ML IV SOLN: 1.0000 mg | INTRAVENOUS | Status: DC | PRN

## 2019-09-09 MED ORDER — BACITRACIN ZINC 500 UNIT/GM EX OINT
TOPICAL_OINTMENT | CUTANEOUS | Status: DC | PRN
  Administered 2019-09-09 (×2): 1 via TOPICAL

## 2019-09-09 MED ORDER — LABETALOL HCL 5 MG/ML IV SOLN
10.0000 mg | INTRAVENOUS | Status: DC | PRN
  Administered 2019-09-09 – 2019-09-10 (×10): 20 mg via INTRAVENOUS
  Administered 2019-09-11: 40 mg via INTRAVENOUS
  Administered 2019-09-11 – 2019-09-12 (×3): 20 mg via INTRAVENOUS
  Administered 2019-09-12: 08:00:00 10 mg via INTRAVENOUS
  Administered 2019-09-12 – 2019-09-25 (×6): 20 mg via INTRAVENOUS
  Filled 2019-09-09: qty 4
  Filled 2019-09-09: qty 8
  Filled 2019-09-09 (×2): qty 4
  Filled 2019-09-09: qty 8
  Filled 2019-09-09 (×13): qty 4

## 2019-09-09 MED ORDER — LEVETIRACETAM IN NACL 500 MG/100ML IV SOLN: 500.0000 mg | Freq: Two times a day (BID) | INTRAVENOUS | Status: DC

## 2019-09-09 MED ORDER — DOCUSATE SODIUM 100 MG PO CAPS: 100.0000 mg | ORAL_CAPSULE | Freq: Two times a day (BID) | ORAL | Status: DC

## 2019-09-09 SURGICAL SUPPLY — 80 items
BASKET BONE COLLECTION (BASKET) IMPLANT
BIT DRILL WIRE PASS 1.3MM (BIT) IMPLANT
BNDG GAUZE ELAST 4 BULKY (GAUZE/BANDAGES/DRESSINGS) ×6 IMPLANT
BNDG STRETCH 4X75 NS LF (GAUZE/BANDAGES/DRESSINGS) ×3 IMPLANT
BNDG STRETCH 4X75 STRL LF (GAUZE/BANDAGES/DRESSINGS) IMPLANT
BUR ACORN 6.0 PRECISION (BURR) ×2 IMPLANT
BUR ACORN 6.0MM PRECISION (BURR) ×1
BUR SPIRAL ROUTER 2.3 (BUR) ×2 IMPLANT
BUR SPIRAL ROUTER 2.3MM (BUR) ×1
CANISTER SUCT 3000ML PPV (MISCELLANEOUS) ×3 IMPLANT
CARTRIDGE OIL MAESTRO DRILL (MISCELLANEOUS) ×1 IMPLANT
CATH ROBINSON RED A/P 12FR (CATHETERS) IMPLANT
CLIP RANEY DISP (INSTRUMENTS) ×3 IMPLANT
CLIP VESOCCLUDE MED 6/CT (CLIP) IMPLANT
COVER WAND RF STERILE (DRAPES) ×3 IMPLANT
DECANTER SPIKE VIAL GLASS SM (MISCELLANEOUS) ×3 IMPLANT
DERMABOND ADVANCED (GAUZE/BANDAGES/DRESSINGS) ×2
DERMABOND ADVANCED .7 DNX12 (GAUZE/BANDAGES/DRESSINGS) ×1 IMPLANT
DIFFUSER DRILL AIR PNEUMATIC (MISCELLANEOUS) ×3 IMPLANT
DRAIN PENROSE 1/2X12 LTX STRL (WOUND CARE) IMPLANT
DRAPE INCISE IOBAN 66X45 STRL (DRAPES) ×3 IMPLANT
DRAPE NEUROLOGICAL W/INCISE (DRAPES) ×3 IMPLANT
DRAPE WARM FLUID 44X44 (DRAPES) ×3 IMPLANT
DRILL WIRE PASS 1.3MM (BIT)
DRSG OPSITE POSTOP 4X6 (GAUZE/BANDAGES/DRESSINGS) ×3 IMPLANT
DRSG PAD ABDOMINAL 8X10 ST (GAUZE/BANDAGES/DRESSINGS) IMPLANT
DRSG TELFA 3X8 NADH (GAUZE/BANDAGES/DRESSINGS) ×3 IMPLANT
DURAPREP 6ML APPLICATOR 50/CS (WOUND CARE) ×6 IMPLANT
ELECT REM PT RETURN 9FT ADLT (ELECTROSURGICAL) ×3
ELECTRODE REM PT RTRN 9FT ADLT (ELECTROSURGICAL) ×1 IMPLANT
EVACUATOR SILICONE 100CC (DRAIN) IMPLANT
FORCEPS BIPOLAR SPETZLER 8 1.0 (NEUROSURGERY SUPPLIES) ×3 IMPLANT
GAUZE 4X4 16PLY RFD (DISPOSABLE) IMPLANT
GAUZE SPONGE 4X4 12PLY STRL (GAUZE/BANDAGES/DRESSINGS) ×3 IMPLANT
GAUZE SPONGE 4X4 16PLY XRAY LF (GAUZE/BANDAGES/DRESSINGS) ×3 IMPLANT
GLOVE BIO SURGEON STRL SZ 6.5 (GLOVE) ×4 IMPLANT
GLOVE BIO SURGEON STRL SZ7 (GLOVE) ×3 IMPLANT
GLOVE BIO SURGEON STRL SZ7.5 (GLOVE) ×3 IMPLANT
GLOVE BIO SURGEON STRL SZ8 (GLOVE) ×3 IMPLANT
GLOVE BIO SURGEONS STRL SZ 6.5 (GLOVE) ×2
GLOVE BIOGEL PI IND STRL 8 (GLOVE) ×1 IMPLANT
GLOVE BIOGEL PI IND STRL 8.5 (GLOVE) ×1 IMPLANT
GLOVE BIOGEL PI INDICATOR 8 (GLOVE) ×2
GLOVE BIOGEL PI INDICATOR 8.5 (GLOVE) ×2
GLOVE ECLIPSE 8.0 STRL XLNG CF (GLOVE) ×3 IMPLANT
GLOVE EXAM NITRILE XL STR (GLOVE) IMPLANT
GOWN STRL REUS W/ TWL LRG LVL3 (GOWN DISPOSABLE) IMPLANT
GOWN STRL REUS W/ TWL XL LVL3 (GOWN DISPOSABLE) IMPLANT
GOWN STRL REUS W/TWL 2XL LVL3 (GOWN DISPOSABLE) IMPLANT
GOWN STRL REUS W/TWL LRG LVL3 (GOWN DISPOSABLE)
GOWN STRL REUS W/TWL XL LVL3 (GOWN DISPOSABLE)
GRAFT SUTURABLE BP 6CMX8CM (Tissue) ×9 IMPLANT
HEMOSTAT SURGICEL 2X14 (HEMOSTASIS) ×3 IMPLANT
KIT BASIN OR (CUSTOM PROCEDURE TRAY) ×3 IMPLANT
KIT TURNOVER KIT B (KITS) ×3 IMPLANT
NEEDLE HYPO 25X1 1.5 SAFETY (NEEDLE) ×3 IMPLANT
NS IRRIG 1000ML POUR BTL (IV SOLUTION) ×3 IMPLANT
OIL CARTRIDGE MAESTRO DRILL (MISCELLANEOUS) ×3
PACK CRANIOTOMY CUSTOM (CUSTOM PROCEDURE TRAY) ×3 IMPLANT
PAD ARMBOARD 7.5X6 YLW CONV (MISCELLANEOUS) ×3 IMPLANT
PATTIES SURGICAL .5 X.5 (GAUZE/BANDAGES/DRESSINGS) IMPLANT
PATTIES SURGICAL .5 X3 (DISPOSABLE) IMPLANT
PATTIES SURGICAL 1X1 (DISPOSABLE) IMPLANT
PIN MAYFIELD SKULL DISP (PIN) ×3 IMPLANT
SPECIMEN JAR SMALL (MISCELLANEOUS) IMPLANT
SPONGE NEURO XRAY DETECT 1X3 (DISPOSABLE) IMPLANT
SPONGE SURGIFOAM ABS GEL 100 (HEMOSTASIS) ×3 IMPLANT
STAPLER SKIN PROX WIDE 3.9 (STAPLE) ×3 IMPLANT
STAPLER VISISTAT (STAPLE) ×6 IMPLANT
SUT ETHILON 3 0 PS 1 (SUTURE) IMPLANT
SUT NURALON 4 0 TR CR/8 (SUTURE) ×3 IMPLANT
SUT VIC AB 2-0 CP2 18 (SUTURE) ×9 IMPLANT
SUT VIC AB 3-0 SH 8-18 (SUTURE) ×6 IMPLANT
SYR CONTROL 10ML LL (SYRINGE) IMPLANT
TAPE CLOTH 1X10 TAN NS (GAUZE/BANDAGES/DRESSINGS) ×3 IMPLANT
TOWEL GREEN STERILE (TOWEL DISPOSABLE) ×3 IMPLANT
TOWEL GREEN STERILE FF (TOWEL DISPOSABLE) ×3 IMPLANT
TRAY FOLEY MTR SLVR 16FR STAT (SET/KITS/TRAYS/PACK) IMPLANT
UNDERPAD 30X30 (UNDERPADS AND DIAPERS) IMPLANT
WATER STERILE IRR 1000ML POUR (IV SOLUTION) ×3 IMPLANT

## 2019-09-09 NOTE — Interval H&P Note (Signed)
History and Physical Interval Note:  09/09/2019 7:53 AM  Joshua [name]  has presented today for surgery, with the diagnosis of LARGE LEFT INTRACRANIAL HEMORRHAGE.  The various methods of treatment have been discussed with the patient and family. After consideration of risks, benefits and other options for treatment, the patient has consented to  Procedure(s): DECOMPRESSIVE CRANIECTOMY AND  HEMATOMA EVACUATION, IMPLANTATION OF SKULL FLAP IN RIGHT ABDOMEN (Left) as a surgical intervention.  The patient's history has been reviewed, patient examined, no change in status, stable for surgery.  I have reviewed the patient's chart and labs.  Questions were answered to the patient's satisfaction.     Peggyann Shoals

## 2019-09-09 NOTE — ED Provider Notes (Signed)
Emergency Department Provider Note   I have reviewed the triage vital signs and the nursing notes.   HISTORY  Chief Complaint Code Stroke   HPI Joshua [name] is a 38 y.o. male who presents for altered mental status and seizure-like activity with significant hypertension and right-sided deficits reported by EMS.  Patient is significantly altered not able to provide history, level 5 caveat.   No other associated or modifying symptoms.   Level V Caveat secondary to acuity and altered mental status  No past medical history on file.  There are no active problems to display for this patient.   No past surgical history on file.  Allergies Patient has no known allergies.  No family history on file.  Social History Social History   Tobacco Use   Smoking status: Not on file  Substance Use Topics   Alcohol use: Not on file   Drug use: Not on file    Review of Systems  Level 5 caveat secondary to altered mental status and acuity ____________________________________________   PHYSICAL EXAM:  VITAL SIGNS: Vitals:   09/09/19 0301 09/09/19 0309  Pulse: 84   Resp: 18   SpO2: 100% 99%  Height: 5\' 9"  (1.753 m)     Constitutional: ill appearing, diaphoretic, vomiting Eyes: Conjunctivae are normal. PERRL. EOMI. Head: Atraumatic. Nose: No congestion/rhinnorhea. Mouth/Throat: Mucous membranes are moist. Some vomitus and red blood. Neck: No stridor.  No meningeal signs.   Cardiovascular: slightly bradycardic. rate, regular rhythm. Cool extremities. Grossly normal heart sounds.   Respiratory: Normal respiratory effort.  No retractions. Lungs CTAB. Gastrointestinal: Soft and nontender. No distention.  Musculoskeletal: No lower extremity tenderness nor edema. No gross deformities of extremities. Neurologic:  Not able to fully assess 2/2 mental status. Skin:  Skin is cool and diaphoretic. No rash noted.   ____________________________________________   LABS (all  labs ordered are listed, but only abnormal results are displayed)  Labs Reviewed  CBC - Abnormal; Notable for the following components:      Result Value   WBC 11.1 (*)    All other components within normal limits  DIFFERENTIAL - Abnormal; Notable for the following components:   Lymphs Abs 6.0 (*)    Eosinophils Absolute 0.6 (*)    All other components within normal limits  COMPREHENSIVE METABOLIC PANEL - Abnormal; Notable for the following components:   Potassium 2.4 (*)    Glucose, Bld 165 (*)    BUN 22 (*)    Creatinine, Ser 2.29 (*)    GFR calc non Af Amer 35 (*)    GFR calc Af Amer 40 (*)    All other components within normal limits  I-STAT CHEM 8, ED - Abnormal; Notable for the following components:   Potassium 2.4 (*)    BUN 23 (*)    Creatinine, Ser 2.10 (*)    Glucose, Bld 161 (*)    Calcium, Ion 1.01 (*)    All other components within normal limits  PROTIME-INR  APTT  ETHANOL  RAPID URINE DRUG SCREEN, HOSP PERFORMED  MAGNESIUM  PHOSPHORUS  TRIGLYCERIDES  CBG MONITORING, ED   ____________________________________________  EKG   EKG Interpretation  Date/Time:    Ventricular Rate:    PR Interval:    QRS Duration:   QT Interval:    QTC Calculation:   R Axis:     Text Interpretation:         ____________________________________________  RADIOLOGY  No results found.  ____________________________________________   PROCEDURES  Procedure(s)  performed:   .Critical Care Performed by: Merrily Pew, MD Authorized by: Merrily Pew, MD   Critical care provider statement:    Critical care time (minutes):  45   Critical care was necessary to treat or prevent imminent or life-threatening deterioration of the following conditions:  Cardiac failure and CNS failure or compromise   Critical care was time spent personally by me on the following activities:  Discussions with consultants, evaluation of patient's response to treatment, examination of patient,  ordering and performing treatments and interventions, ordering and review of laboratory studies, ordering and review of radiographic studies, pulse oximetry, re-evaluation of patient's condition, obtaining history from patient or surrogate and review of old charts   I assumed direction of critical care for this patient from another provider in my specialty: no   Procedure Name: Intubation Date/Time: 09/09/2019 3:31 AM Performed by: Merrily Pew, MD Pre-anesthesia Checklist: Patient identified, Patient being monitored, Emergency Drugs available, Timeout performed and Suction available Oxygen Delivery Method: Non-rebreather mask Preoxygenation: Pre-oxygenation with 100% oxygen Induction Type: Rapid sequence Ventilation: Mask ventilation without difficulty Laryngoscope Size: Miller and 4 Grade View: Grade I Tube size: 7.5 mm Number of attempts: 1 Placement Confirmation: ETT inserted through vocal cords under direct vision,  CO2 detector and Breath sounds checked- equal and bilateral Dental Injury: Teeth and Oropharynx as per pre-operative assessment  Future Recommendations: Recommend- induction with short-acting agent, and alternative techniques readily available Comments: Poor dentition prior to procedure, no obvious change after wards.      ___________________________________________   INITIAL IMPRESSION / ASSESSMENT AND PLAN / ED COURSE  Patient here with what appeared to initially be seizure-like activity.  However while here he started having profuse vomiting and was not protecting his airway so was intubated prior to that noticed he had a new asymmetric pupils.  On arrival he was around 2 mm bilaterally but prior to intubation his left pupil was approximately 5 to 6 mm while his right one was still around 2.  Patient ultimately was found to have a large intracranial bleed.  Has been started on propofol for sedation but also will be started on clevidipine for blood pressure control.   Discussed with Dr. Vertell Limber with neurosurgery who reviewed images and will discuss plan with Dr. Cheral Marker.   Patient to OR.  Pertinent labs & imaging results that were available during my care of the patient were reviewed by me and considered in my medical decision making (see chart for details). ____________________________________________  FINAL CLINICAL IMPRESSION(S) / ED DIAGNOSES  Final diagnoses:  Hemorrhagic stroke ([place])     MEDICATIONS GIVEN DURING THIS VISIT:  Medications  sodium chloride flush (NS) 0.9 % injection 3 mL (has no administration in time range)  LORazepam (ATIVAN) 2 MG/ML injection (has no administration in time range)  lactated ringers bolus 2,000 mL (has no administration in time range)  potassium chloride 10 mEq in 100 mL IVPB (has no administration in time range)  magnesium sulfate IVPB 2 g 50 mL (has no administration in time range)  clevidipine (CLEVIPREX) 0.5 MG/ML infusion (has no administration in time range)  propofol (DIPRIVAN) 1000 MG/100ML infusion (has no administration in time range)  fentaNYL (SUBLIMAZE) injection 100 mcg (has no administration in time range)  fentaNYL (SUBLIMAZE) injection 100 mcg (has no administration in time range)  midazolam (VERSED) injection 2 mg (has no administration in time range)  midazolam (VERSED) injection 2 mg (has no administration in time range)     NEW OUTPATIENT MEDICATIONS STARTED DURING  THIS VISIT:  New Prescriptions   No medications on file    Note:  This note was prepared with assistance of Dragon voice recognition software. Occasional wrong-word or sound-a-like substitutions may have occurred due to the inherent limitations of voice recognition software.   Emmarae Cowdery, Corene Cornea, MD 09/09/19 [phone]

## 2019-09-09 NOTE — ED Notes (Signed)
Per Dr. Earnestine Leys 10 mg of hydralazine was given IV.

## 2019-09-09 NOTE — Progress Notes (Signed)
Spoke to Neuro MD Lindzen at bedside about patient's blood pressure. Cuff pressure is under [id number] systolic, but arterial line is running between 140-150. MD aware of difference. Will continue to monitor.

## 2019-09-09 NOTE — Progress Notes (Signed)
Initial Nutrition Assessment RD working remotely.  DOCUMENTATION CODES:   Not applicable  INTERVENTION:    Vital High Protein at 50 ml/h (1200 ml per day)   D/C Pro-stat    Provides 1200 kcal, 105 gm protein, 1003 ml free water daily   Calorie intake with TF and Cleviprex is exceeding estimated needs.  NUTRITION DIAGNOSIS:   Inadequate oral intake related to inability to eat as evidenced by NPO status.  GOAL:   Patient will meet greater than or equal to 90% of their needs  MONITOR:   Vent status, TF tolerance, Labs, Skin  REASON FOR ASSESSMENT:   Ventilator, Consult Enteral/tube feeding initiation and management  ASSESSMENT:   38 yo male admitted with large left intracranial hemorrhage; S/P craniotomy. PMH includes HTN, alcoholism, noncompliance.   Received MD Consult for TF initiation and management. OG tube in place.  Patient is currently intubated on ventilator support MV: 10 L/min Temp (24hrs), Avg:96.6 F (35.9 C), Min:91.5 F (33.1 C), Max:100.2 F (37.9 C)  Propofol: discontinued Cleviprex at 6.9-128 ml/h today. On average providing >1800 kcal per day.   Labs reviewed. Potassium 2.1 (L)--> 3.9 WNL, BUN 21 (H), creatinine 2.08 (H), phosphorus 2.2 (L)  Medications reviewed and include colace, folic acid, thiamine, cleviprex.  IVF: NS with 20 mEq KCl at 75 ml/h  No recent weights to review. Current weight is 13% below ideal weight. Suspect poor nutrition status related to history of alcoholism.  I/O +2 L since admission  NUTRITION - FOCUSED PHYSICAL EXAM:  unable to complete  Diet Order:   Diet Order            Diet NPO time specified  Diet effective now              EDUCATION NEEDS:   Not appropriate for education at this time  Skin:  Skin Assessment: Reviewed RN Assessment(incisions to head and abdomen)  Last BM:  PTA  Height:   Ht Readings from Last 1 Encounters:  09/09/19 5\' 9"  (1.753 m)    Weight:   Wt Readings  from Last 1 Encounters:  09/09/19 63 kg    Ideal Body Weight:  72.7 kg  BMI:  Body mass index is 20.51 kg/m.  Estimated Nutritional Needs:   Kcal:  1850  Protein:  95-105 gm  Fluid:  >/= 1.9 L    Molli Barrows, RD, LDN, [place] Pager [phone] After Hours Pager [phone]

## 2019-09-09 NOTE — Progress Notes (Signed)
STROKE TEAM PROGRESS NOTE   HISTORY OF PRESENT ILLNESS (per record) Joshua [name] is a 38 y.o. male presenting from home via EMS. He was inside of a possibly illicit smoking establishment with armed personnel that did not look official guarding the door [facility] [place] Smell inside was of "vape juice" with a strong fruity smell. There was smoke permeating the environment.   Girlfriend, friends and employees witnessed "a seizure" lasting "a few minutes". He threw up after the shaking stopped and was confused after. He was normal before the seizure. EMS noted urinary incontinence on arrival as well as bowel incontinence. In the ambulance he was moving his left side only. Seemed to realize there was paralysis of his RUE. He grabbed it with his LUE and brought it over his stomach and shaked it as if trying to make it move again. No RLE movement noted by EMS. He flexed at his LLE at the knee when noxious stimulus was applied to right hand. Was nonverbal and not following any commands. Vomited in association with the seizure with vomitus noted by EMS on arrival. During transport and in the ED the patient continued to vomit intermittently. He was not administered Ativan or Versed en route.   Was recently in the hospital for "EtOH poisoning" with a seizure per his brother who was at the establishment. Per brother this is the second time that the patient has had a seizure.    INTERVAL HISTORY Patient has just returned from OR following hematoma evacuation and hemicraniectomy and remains sedated and paralyzed.  Discussed case with Dr. Vertell Limber.  Patient's 2 sisters and stable father and at the bedside.  Blood pressure controlled on Cleviprex drip.    OBJECTIVE Vitals:   09/09/19 0345 09/09/19 0400 09/09/19 0415 09/09/19 0800  BP: (!) 170/112 (!) 154/96 131/79 (!) 138/118  Pulse: (!) 107 (!) 109 89   Resp: 19 (!) 22 18 16   Temp:    (!) 91.5 F (33.1 C)  TempSrc:     Rectal  SpO2: 100% 100% 100% 100%  Weight:      Height:        CBC:  Recent Labs  Lab 09/09/19 0226 09/09/19 0235 09/09/19 0412  WBC 11.1*  --   --   NEUTROABS 3.6  --   --   HGB 14.5 15.6 14.6  HCT 43.6 46.0 43.0  MCV 88.8  --   --   PLT 204  --   --     Basic Metabolic Panel:  Recent Labs  Lab 09/09/19 0226 09/09/19 0235 09/09/19 0407 09/09/19 0412  NA 140 140  --  139  K 2.4* 2.4*  --  2.1*  CL 103 104  --   --   CO2 22  --   --   --   GLUCOSE 165* 161*  --   --   BUN 22* 23*  --   --   CREATININE 2.29* 2.10*  --   --   CALCIUM 9.2  --   --   --   MG  --   --  1.9  --   PHOS  --   --  2.2*  --     Lipid Panel:     Component Value Date/Time   TRIG 252 (H) 09/09/2019 0408   HgbA1c: No results found for: HGBA1C Urine Drug Screen:     Component Value Date/Time   LABOPIA NONE DETECTED 09/09/2019 0349  COCAINSCRNUR NONE DETECTED 09/09/2019 0349   LABBENZ NONE DETECTED 09/09/2019 0349   AMPHETMU NONE DETECTED 09/09/2019 0349   THCU NONE DETECTED 09/09/2019 0349   LABBARB NONE DETECTED 09/09/2019 0349    Alcohol Level     Component Value Date/Time   ETH <10 09/09/2019 0407    IMAGING  Dg Chest Port 1 View 09/09/2019 IMPRESSION:  No acute cardiopulmonary abnormality. Appropriate positioning of the endotracheal and transesophageal tubes.   Ct Head Code Stroke Wo Contrast 09/09/2019 IMPRESSION:  1. 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  2. Intraventricular extension of hemorrhage. Small focus of intraparenchymal hemorrhage in the right basal ganglia.    ECG - SR rate 97 BPM. LVH and prolonged QT interval. (See cardiology reading for complete details)   EEG - not ordered   PHYSICAL EXAM Blood pressure (!) 138/118, pulse 89, temperature (!) 91.5 F (33.1 C), temperature source Rectal, resp. rate 16, height 5\' 9"  (1.753 m), weight 63  kg, SpO2 100 %.  Neurological Exam ;  Patient is sedated intubated and paralyzed.  Eyes are closed.  Pupils 3 mm sluggishly reactive.  Doll's eye movements are absent.  Corneal reflexes are sluggish.  Fundi not visualized.  Face is symmetric.  Tongue midline.  No spontaneous motor movements.  No response to sternal rub or nailbed pressure.  Plantars are downgoing.   ASSESSMENT/PLAN Mr. Ailton Belville is a 38 y.o. male with history of admission for Etoh poisoning presenting with a seizure. He did not receive IV t-PA due to hemorrhage.  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 Intraventricular extension of hemorrhage. Small focus of intraparenchymal hemorrhage in the right basal ganglia.   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 Intraventricular extension of hemorrhage. Small focus of intraparenchymal hemorrhage in the right basal ganglia.   CT head - pending  MRI head - not ordered  MRA head - not ordered  CTA H&N - not ordered  CT Perfusion - not ordered  Carotid Doppler - not indicated  2D Echo - not indicated  EEG - not ordered  [employer] Virus 2 - negative  LDL - not ordered No results found for: LDLCALC  HgbA1c - No results found for requested labs within last 26280 hours.  UDS - negative  VTE prophylaxis - will order SCDs Diet  Diet Order            Diet NPO time specified  Diet effective now              No antithrombotic prior to admission, now on No antithrombotic  Ongoing aggressive stroke risk factor management  Therapy recommendations:  pending  Disposition:  Pending  Hypertension  Home BP meds: none  Current BP meds: Cleviprex  Blood pressure high at  times  SBP goal < 160 mm Hg . Long-term BP goal normotensive  Hyperlipidemia  Home Lipid lowering medication: none  LDL - not ordered, goal < 70  Current lipid lowering medication: None  Continue statin at discharge   Other Stroke Risk Factors  Family History - not on file  Tobacco Use - not on file  ETOH  Use - not on file  PMHX - not on file     Other Active Problems  Seizures - Now on Keppra 500 mg IV Bid  Intraparenchymal hematoma with midline shift -> decompressive craniectomy this AM Dr Vertell Limber  Hypokalemia- 2.1 -> supplemented  Intubated  Creatinine - 2.29->2.10  Probable undiagnosed / untreated hypertension (severe LVH on ECG)    Hospital day # 0  I have personally obtained history,examined this patient, reviewed notes, independently viewed imaging studies, participated in medical decision making and plan of care.ROS completed by me personally and pertinent positives fully documented  I have made any additions or clarifications directly to the above note.  Continue close neurological monitoring and strict blood pressure control with systolic blood pressure goal less than 140 for 24 hours and then less than 160.  Continue blood pressure drip and resume home medications through NG tube.  Maintain normoglycemia and normothermia and euvolemia.  Long discussion with patient sisters and stepfather at the bedside and answered questions.  Prognosis appears guarded at this time.  Repeat CT scan of the head tomorrow morning.  Discussed with Dr. Joaquim Lai neurosurgery. This patient is critically ill and at significant risk of neurological worsening, death and care requires constant monitoring of vital signs, hemodynamics,respiratory and cardiac monitoring, extensive review of multiple databases, frequent neurological assessment, discussion with family, other specialists and medical decision making of high complexity.I have made any additions or clarifications directly to the  above note.This critical care time does not reflect procedure time, or teaching time or supervisory time of PA/NP/Med Resident etc but could involve care discussion time.  I spent 35 minutes of neurocritical care time  in the care of  this patient.     Antony Contras, MD Medical Director [facility] Pager: [phone] 09/09/2019 1:49 PM   To contact Stroke Continuity provider, please refer to [email or web address] After hours, contact General Neurology

## 2019-09-09 NOTE — Consult Note (Addendum)
NEURO HOSPITALIST CONSULT NOTE   Requesting physician: Dr. Dayna Barker  Reason for Consult: Seizure followed by right hemiplegia  History obtained from:  EMS and Chart     HPI:                                                                                                                                          Joshua [name] is an 38 y.o. male presenting from home via EMS. He was inside of a possibly illicit smoking establishment with armed personnel that did not look official guarding the door [facility] [place] the [place]). Smell inside was of "vape juice" with a strong fruity smell. There was smoke permeating the environment.   Girlfriend, friends and employees witnessed "a seizure" lasting "a few minutes". He threw up after the shaking stopped and was confused after. He was normal before the seizure. EMS noted urinary incontinence on arrival as well as bowel incontinence. In the ambulance he was moving his left side only. Seemed to realize there was paralysis of his RUE. He grabbed it with his LUE and brought it over his stomach and shaked it as if trying to make it move again. No RLE movement noted by EMS. He flexed at his LLE at the knee when noxious stimulus was applied to right hand. Was nonverbal and not following any commands. Vomited in association with the seizure with vomitus noted by EMS on arrival. During transport and in the ED the patient continued to vomit intermittently. He was not administered Ativan or Versed en route.   Was recently in the hospital for "EtOH poisoning" with a seizure per his brother who was at the establishment. Per brother this is the second time that the patient has had a seizure.   PMHx Has HTN, per friend One prior episode of seizure related to EtOH intoxication, per friend No medical history listed in Epic  No past surgical history on file.  No family history on file.             Social History:  has no  history on file for tobacco, alcohol, and drug.  No Known Allergies  MEDICATIONS:  Not on any meds per friend.  No medication list in Epic   ROS:                                                                                                                                       Unable to obtain due to AMS.   There were no vitals taken for this visit.   General Examination:                                                                                                       Physical Exam  HEENT-  [place]/AT   Lungs- No gross increased work of breathing Extremities- No edema  Neurological Examination Mental Status: Eyes open. Not gazing towards or away from visual stimuli. Intermittent agitation. Does not follow any commands. Nonverbal, does not attempt to communicate.  Cranial Nerves: II: Does not blink to threat. Pupils 2 mm, equal and reactive bilaterally.   III,IV, VI: Intermittently with left sided conjugate gaze deviation. No nystagmus.  V,VII: Face symmetric, but will not smile. Not responding to tactile facial stimulation.   VIII: Not responding to name or commands IX,X: Unable to visualize palate.  XI: Not able to test. Head is at the midline XII: Unable to test Motor/Sensory: RUE and RLE without any purposeful movement or posturing spontaneously or to noxious stimuli. Flaccid tone of RUE, increased/spastic tone of RLE.  LUE and LLE with spontaneous movement, up to 5/5 strength. LUE with intermittent movement to pinch. LLE with brisk withdrawal to noxious.   Intermittent coarse tremulous movements of BLE in conjunction with rigid extension at hips and knees bilaterally.  Deep Tendon Reflexes: 3+ bilateral upper extremities. 4+ bilateral patellae.  Plantars: Right: downgoing   Left: downgoing Cerebellar/Gait: Unable to assess.   Follow up evaluation during  intubation: New onset anisocoria: Left pupil 5 mm and sluggishly reactive. Right pupil pinpoint.  Leftward gaze deviation.  Intermittent tremulous movements of BLE with increased tone.    Lab Results: Basic Metabolic Panel: No results for input(s): NA, K, CL, CO2, GLUCOSE, BUN, CREATININE, CALCIUM, MG, PHOS in the last 168 hours.  CBC: No results for input(s): WBC, NEUTROABS, HGB, HCT, MCV, PLT in the last 168 hours.  Cardiac Enzymes: No results for input(s): CKTOTAL, CKMB, CKMBINDEX, TROPONINI in the last 168 hours.  Lipid Panel: No results for input(s): CHOL, TRIG, HDL, CHOLHDL, VLDL, LDLCALC in the last 168 hours.  Imaging: No results found.  CT head preliminary read: Large left  hemispheric ICH with mass effect, midline shift and initial stages of uncal herniation.   Assessment: 38 year old male with acute left cerebral hemisphere hemorrhage on CT after presenting to the ED following a seizure.  1. 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 However, spontaneous hemorrhage with secondary hypertensive physiological response is also on the DDx.  2. Received hydralazine for initial BP management followed by initiation of clevidipine gtt 3. Was intubated prior to CT for airway protection in the setting of persistence of intermittent vomiting with inability to protect airway.  4. Received 2 mg IV Ativan in CT.  5. EtOH level and toxicology panel have been ordered.  6. CT head preliminary read: Large left hemispheric ICH with mass effect, midline shift and initial stages of uncal herniation.  7. ICH score is 2  Recommendations: 1. STAT Neurosurgery consult for possible clot evacuation +/- ventricular drain. ED is contacting Neurosurgery.  2. Keppra 2000 mg IV x 1, followed by scheduled dosing at 500 mg IV BID.  3. Continue Cleviprex gtt with SBP  goal of < 140 4. CCM will need to be consulted for vent management.  5. Disposition pending Neurosurgery evaluation.  6. Will need to be started on 3% saline if not a neurosurgery candidate.   80 minutes spent in the emergent neurological evaluation and management of this critically ill patient.    Electronically signed: Dr. Kerney Elbe 09/09/2019, 2:33 AM

## 2019-09-09 NOTE — OR Nursing (Signed)
Incision for implantation of skull flap to abdomen at 0637. Incision closed at 0654. Counts correct.

## 2019-09-09 NOTE — Progress Notes (Signed)
Pt transported from ED TRAB to CT and back with no complications.

## 2019-09-09 NOTE — Progress Notes (Addendum)
Upon admission, pt has 23 $1 bills, 2 $5 bills, 2 $10 bills, 18 $20 bills, 1 $100 bills, 2 $2 bills in wallet with DL, 3 Visa cards.   -yellow metal necklace -yellow metal ring  -shoes -keys -[employer], keys & money all sent to safe. Valuable envelope [id number]

## 2019-09-09 NOTE — Consult Note (Signed)
PULMONARY / CRITICAL CARE MEDICINE   NAME:  Joshua [name], MRN:  [id number], DOB:  1981-03-24, LOS: 0 ADMISSION DATE:  09/09/2019, CONSULTATION DATE: 09/09/2019 REFERRING MD: Neurosurgery, CHIEF COMPLAINT: Vent dependent respiratory failure secondary to intracranial hemorrhage  BRIEF HISTORY:    38 year old history of alcohol abuse seizures noncompliant hypertension who presented with new seizure right-sided hemiplegia confusion was found to have a large left intracranial hemorrhage for which he underwent craniectomy. HISTORY OF PRESENT ILLNESS   38 year old male with a history of alcohol abuse to the point of alcohol poisoning resulting in a seizure.  He was in a drug house and was noted to have a seizure and woke up with right-sided hemiplegia.  He was noted to have vomited, urinary incontinence, bowel incontinence with confusion.  He was nonverbal and not following commands.  EMS was activated he is transported to [facility] evaluated by neurology and neurosurgery and was taken to the operating room for urgent left frontal temporal craniectomy evacuation hematoma with skull flap implantation. He returned to the neurosurgical intensive care unit intubated and had received neuromuscular blockade prior to being transported to the unit.  He is currently on propofol drip, Cleviprex drip full mechanical ventilatory support pulmonary critical care asked to evaluate and assist with his care in ICU.  He did have profound hypokalemia which was addressed in OR multiple runs of potassium BUN is been ordered to reevaluate. SIGNIFICANT PAST MEDICAL HISTORY   Hypertension Alcohol abuse  SIGNIFICANT EVENTS:  09/08/2021 operating room for left frontal temporal craniectomy and evacuation hematoma STUDIES:   CT of head 09/09/2019 large left intracerebral hemorrhage CULTURES:  09/09/2019 sputum>>  ANTIBIOTICS:    LINES/TUBES:  09/09/2019 endotracheal tube 09/09/2019 orogastric tube 09/09/2019 right  radial A-line  CONSULTANTS:  09/09/2019 pulmonary critical care 09/09/2019 neurology SUBJECTIVE:  38 year old with a history of substance abuse witnessed seizure large left intracranial hemorrhage status post craniectomy and hematoma evacuation currently on full mechanical ventilatory support.  CONSTITUTIONAL: BP (!) 138/118 (BP Location: Right Arm)   Pulse 89   Temp (!) 91.5 F (33.1 C) (Rectal)   Resp 16   Ht 5\' 9"  (1.753 m)   Wt 63 kg   SpO2 100%   BMI 20.51 kg/m   I/O last 3 completed shifts: In: 1131.7 [I.V.:1000; IV Piggyback:131.7] Out: 850 [Urine:600; Blood:250]     Vent Mode: PRVC FiO2 (%):  [40 %-100 %] 100 % Set Rate:  [18 bmp] 18 bmp Vt Set:  [560 mL] 560 mL PEEP:  [5 cmH20] 5 cmH20 Plateau Pressure:  [16 cmH20] 16 cmH20  PHYSICAL EXAM: General: Cachectic male who is currently in neuromuscular blockade Neuro: Currently neuromuscular blockade. HEENT: Left cranial dressing with some blood saturation.  Pupils currently equal reactive to light.  Endotracheal tube in place gastric tube in place Cardiovascular: Heart sounds are regular regular rate and rhythm Lungs: Clear to auscultation Abdomen: Right mid quadrant skull flap implantation unremarkable.  Faint bowel sounds Musculoskeletal: Intact Skin: Dry and cool  RESOLVED PROBLEM LIST   ASSESSMENT AND PLAN   Ventilator dependent respiratory failure in the setting of large intracerebral hemorrhage left.  Noted to have vomited at scene but no evidence of aspiration at this time. Ventilator bundle and adjustments based on ABGs Serial ABGs Serial chest x-ray No antimicrobial therapy Sputum culture will be obtained for complete  Large left intracerebral hemorrhage status post left craniectomy with evacuation of hematoma with skull flap implantation right abdomen. Per neurosurgery Evaluation neurological status once neuromuscular blockade has  worn off  Blood pressure controlled status post intracerebral  hemorrhage with goal systolic blood pressure less than 140.  History of hypertension with poor compliance with medication. Cleviprex drip Sedation as needed to tolerate endotracheal tube  Hypokalemia Replete potassium Monitor magnesium and phosphorus replete as needed Repeat bemet  Acute kidney injury Lab Results  Component Value Date   CREATININE 2.10 (H) 09/09/2019   CREATININE 2.29 (H) 09/09/2019   CREATININE 1.4 04/05/2009  Monitor creatinine Avoid nephrotoxins Hydration as needed  Polysubstance abuse Counseling if he survives Folic acid and thiamine initially IV transition to p.o. once tube feeds are stab  Hypothermia Warming blankets Monitor         SUMMARY OF TODAY'S PLAN:  38 year old male who had a witnessed seizure vomited with right-sided weakness.  Transported by EMS service from suspected drug house.  CT scan revealed large left intracranial hemorrhage.  With impending herniation with pupil dilatation taken to the OR for urgent decompressive craniectomy and hematoma evacuation and implantation of skull flap to right abdomen.  Return to the neurosurgical ICU on full mechanical ventilatory support having just received neuromuscular blockade.  He was noted to be hypokalemic and now are receiving potassium supplementation.  Pulmonary critical care asked to assist in care in ICU.  Best Practice / Goals of Care / Disposition.   DVT PROPHYLAXIS: Compression hose SUP: PPI NUTRITION: N.p.o. tube feedings MOBILITY: Bedrest GOALS OF CARE: Full code FAMILY DISCUSSIONS: No family available at this time DISPOSITION neurosurgical intensive care unit following left frontal temporal craniotomy  LABS  Glucose No results for input(s): GLUCAP in the last 168 hours.  BMET Recent Labs  Lab 09/09/19 0226 09/09/19 0235 09/09/19 0412  NA 140 140 139  K 2.4* 2.4* 2.1*  CL 103 104  --   CO2 22  --   --   BUN 22* 23*  --   CREATININE 2.29* 2.10*  --   GLUCOSE 165* 161*   --     Liver Enzymes Recent Labs  Lab 09/09/19 0226  AST 23  ALT 17  ALKPHOS 73  BILITOT 0.4  ALBUMIN 4.1    Electrolytes Recent Labs  Lab 09/09/19 0226 09/09/19 0407  CALCIUM 9.2  --   MG  --  1.9  PHOS  --  2.2*    CBC Recent Labs  Lab 09/09/19 0226 09/09/19 0235 09/09/19 0412  WBC 11.1*  --   --   HGB 14.5 15.6 14.6  HCT 43.6 46.0 43.0  PLT 204  --   --     ABG Recent Labs  Lab 09/09/19 0412  PHART 7.443  PCO2ART 44.4  PO2ART 592.0*    Coag's Recent Labs  Lab 09/09/19 0226  APTT 29  INR 0.9    Sepsis Markers No results for input(s): LATICACIDVEN, PROCALCITON, O2SATVEN in the last 168 hours.  Cardiac Enzymes No results for input(s): TROPONINI, PROBNP in the last 168 hours.  PAST MEDICAL HISTORY :   Reportedly has history of alcohol-related seizure Questionable hypertension noncompliant with medication History of alcohol poisoning  PAST SURGICAL HISTORY:  He  has no past surgical history on file.  No Known Allergies  No current facility-administered medications on file prior to encounter.    No current outpatient medications on file prior to encounter.    FAMILY HISTORY:   Unknown at this time  SOCIAL HISTORY:  Reported to be heavy smoker and drinker with a history of alcohol poisoning.  REVIEW OF SYSTEMS:    na  App cct 67 min  Richardson Landry Berel Najjar ACNP Maryanna Shape PCCM Pager [phone] till 1 pm If no answer page [phone] 09/09/2019, 8:31 AM

## 2019-09-09 NOTE — ED Notes (Signed)
Pt intubated during emergency; all necessary staff present with appropriate PPE; medications administered Etomidate 20mg  and Rocromium 65mg ; placement done by Dr. Dayna Barker, verified with radiology; CO2 change and bilaterally breathe sounds noted; tube at 24 at the lip; OG tube placed and verified with radiology and auscultation
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
## 2019-09-09 NOTE — Anesthesia Procedure Notes (Addendum)
Arterial Line Insertion Start/End9/11/2019 5:00 AM, 09/09/2019 5:10 AM Performed by: Murvin Natal, MD, anesthesiologist  Patient location: OR. Preanesthetic checklist: IV checked, site marked, surgical consent, monitors and equipment checked, pre-op evaluation and anesthesia consent Lidocaine 1% used for infiltration Right, radial was placed Catheter size: 20 G Hand hygiene performed , maximum sterile barriers used  and Seldinger technique used  Attempts: 1 Procedure performed using ultrasound guided technique. Ultrasound Notes:anatomy identified, needle tip was noted to be adjacent to the nerve/plexus identified and no ultrasound evidence of intravascular and/or intraneural injection Following insertion, dressing applied and Biopatch. Post procedure assessment: normal and unchanged  Patient tolerated the procedure well with no immediate complications.

## 2019-09-09 NOTE — OR Nursing (Signed)
Pt. Belongings sent to [address] with pt. Report from ED staff was nothing valuable to send to security.

## 2019-09-09 NOTE — Transfer of Care (Signed)
Immediate Anesthesia Transfer of Care Note  Patient: Joshua [name]  Procedure(s) Performed: DECOMPRESSIVE CRANIECTOMY AND  HEMATOMA EVACUATION, IMPLANTATION OF SKULL FLAP IN RIGHT ABDOMEN (Left Head)  Patient Location: NICU  Anesthesia Type:General  Level of Consciousness: sedated, unresponsive and Patient remains intubated per anesthesia plan  Airway & Oxygen Therapy: Patient remains intubated per anesthesia plan and Patient placed on Ventilator (see vital sign flow sheet for setting)  Post-op Assessment: Report given to RN and Post -op Vital signs reviewed and stable  Post vital signs: Reviewed and stable  Last Vitals:  Vitals Value Taken Time  BP 138/118 09/09/19 0800  Temp 33.1 C 09/09/19 0814  Pulse 94 09/09/19 0814  Resp 17 09/09/19 0814  SpO2 100 % 09/09/19 0814  Vitals shown include unvalidated device data.  Last Pain:  Vitals:   09/09/19 0800  TempSrc: Rectal         Complications: No apparent anesthesia complications

## 2019-09-09 NOTE — Op Note (Signed)
09/09/2019  7:58 AM  PATIENT:  Joshua [name]  38 y.o. male  PRE-OPERATIVE DIAGNOSIS:  LARGE LEFT INTRACRANIAL HEMORRHAGE with herniation  POST-OPERATIVE DIAGNOSIS:  LARGE LEFT INTRACRANIAL HEMORRHAGE with herniation  PROCEDURE:  Procedure(s): DECOMPRESSIVE CRANIECTOMY AND  HEMATOMA EVACUATION, IMPLANTATION OF SKULL FLAP IN RIGHT ABDOMEN (Left)  SURGEON:  Surgeon(s) and Role:    Erline Levine, MD - Primary    * Kary Kos, MD - Assisting  PHYSICIAN ASSISTANT:   ASSISTANTS: none   ANESTHESIA:   general  EBL:  250 mL   BLOOD ADMINISTERED:none  DRAINS: none   LOCAL MEDICATIONS USED:  MARCAINE    and LIDOCAINE   SPECIMEN:  No Specimen  DISPOSITION OF SPECIMEN:  N/A  COUNTS:  YES  TOURNIQUET:  * No tourniquets in log *  DICTATION: Patient is 38 year old man with large left fronto-temporal ICH with enlarging pupil and impending herniation with increasing shift and mass effect.  It was elected to take patient to surgery for decompressive craniectomy for ICH  Procedure:  Following smooth intubation, patient was placed in right semi-lateral position with blanket roll.  Head was placed in pins and left hemispheric scalp was shaved and prepped and draped in usual sterile fashion along with right abdomen.  Area of planned incision was infiltrated with lidocaine. A curvilinear incision was made and carried through temporalis fascia and muscle to expose calvarium.  A large skull flap was elevated exposing tense brain.  Dura was opened and a corticotomy was created in the frontal lobe and carried to the large hematoma cavity.  This was evacuated and a considerable amount of blood was removed.  Extensive irrigation was performed.  Hemostasis was assured with irrigation and cotton balls, Surgifoam, Hydrogen Peroxide soaked cotton balls.    Hemostasis was assured and the cavity was lined with Surgicell.  The brain was considerably more relaxed after hematoma evacuation, but because of  significant compression, I elected to place the skull flap in the abdomen.  An incision was made and carried to the fascia.  A subcutaneous pocket was created.  The bone flap was cut in two so as to fit better.  This was inserted and the incision was closed with 2-0 and 3-0 Vicryl sutures.  The dura was loosely placed over the brain and 3, 5 x 8 cm Duraguard patches were placed on the brain surface.  The fascia and galea were closed with 2-0 vicryl sutures and the skin was re approximated with staples.  Sterile occlusive dressings were placed.  Patient was returned to a supine position and taken to Neuro ICU for postop recovery.  PLAN OF CARE: Admit to inpatient   PATIENT DISPOSITION:  PACU - hemodynamically stable.   Delay start of Pharmacological VTE agent (>24hrs) due to surgical blood loss or risk of bleeding: yes

## 2019-09-09 NOTE — Anesthesia Preprocedure Evaluation (Signed)
Anesthesia Evaluation    Reviewed: Allergy & Precautions, Patient's Chart, lab work & pertinent test resultsPreop documentation limited or incomplete due to emergent nature of procedure.  Airway        Dental   Pulmonary neg pulmonary ROS,           Cardiovascular negative cardio ROS    ECG: rate 97. Sinus tachycardia Ventricular premature complex Right atrial enlargement Left ventricular hypertrophy   Neuro/Psych Seizures -,   right hemiplegia    GI/Hepatic negative GI ROS, Neg liver ROS,   Endo/Other  hyponatremia  Renal/GU Renal disease     Musculoskeletal negative musculoskeletal ROS (+)   Abdominal   Peds  Hematology negative hematology ROS (+)   Anesthesia Other Findings LARGE LEFT INTRACRANIAL HEMORRHAGE  Reproductive/Obstetrics                             Anesthesia Physical Anesthesia Plan  ASA: IV and emergent  Anesthesia Plan: General   Post-op Pain Management:    Induction: Inhalational and Intravenous  PONV Risk Score and Plan: 2 and Ondansetron, Dexamethasone and Treatment may vary due to age or medical condition  Airway Management Planned: Oral ETT  Additional Equipment: Arterial line  Intra-op Plan:   Post-operative Plan: Post-operative intubation/ventilation  Informed Consent:   Plan Discussed with:   Anesthesia Plan Comments:         Anesthesia Quick Evaluation

## 2019-09-09 NOTE — Anesthesia Postprocedure Evaluation (Signed)
Anesthesia Post Note  Patient: Joshua [name]  Procedure(s) Performed: DECOMPRESSIVE CRANIECTOMY AND  HEMATOMA EVACUATION, IMPLANTATION OF SKULL FLAP IN RIGHT ABDOMEN (Left Head)     Patient location during evaluation: NICU Anesthesia Type: General Level of consciousness: sedated Pain management: pain level controlled Vital Signs Assessment: post-procedure vital signs reviewed and stable Respiratory status: patient remains intubated per anesthesia plan Cardiovascular status: stable Postop Assessment: no apparent nausea or vomiting Anesthetic complications: no    Last Vitals:  Vitals:   09/09/19 0415 09/09/19 0800  BP: 131/79 (!) 138/118  Pulse: 89   Resp: 18 16  Temp:  (!) 33.1 C  SpO2: 100% 100%    Last Pain:  Vitals:   09/09/19 0800  TempSrc: Rectal                 Kahlee Metivier S

## 2019-09-09 NOTE — Brief Op Note (Signed)
09/09/2019  7:58 AM  PATIENT:  Joshua [name]  38 y.o. male  PRE-OPERATIVE DIAGNOSIS:  LARGE LEFT INTRACRANIAL HEMORRHAGE with herniation  POST-OPERATIVE DIAGNOSIS:  LARGE LEFT INTRACRANIAL HEMORRHAGE with herniation  PROCEDURE:  Procedure(s): DECOMPRESSIVE CRANIECTOMY AND  HEMATOMA EVACUATION, IMPLANTATION OF SKULL FLAP IN RIGHT ABDOMEN (Left)  SURGEON:  Surgeon(s) and Role:    Erline Levine, MD - Primary    * Kary Kos, MD - Assisting  PHYSICIAN ASSISTANT:   ASSISTANTS: none   ANESTHESIA:   general  EBL:  250 mL   BLOOD ADMINISTERED:none  DRAINS: none   LOCAL MEDICATIONS USED:  MARCAINE    and LIDOCAINE   SPECIMEN:  No Specimen  DISPOSITION OF SPECIMEN:  N/A  COUNTS:  YES  TOURNIQUET:  * No tourniquets in log *  DICTATION: Patient is 38 year old man with large left fronto-temporal ICH with enlarging pupil and impending herniation with increasing shift and mass effect.  It was elected to take patient to surgery for decompressive craniectomy for ICH  Procedure:  Following smooth intubation, patient was placed in right semi-lateral position with blanket roll.  Head was placed in pins and left hemispheric scalp was shaved and prepped and draped in usual sterile fashion along with right abdomen.  Area of planned incision was infiltrated with lidocaine. A curvilinear incision was made and carried through temporalis fascia and muscle to expose calvarium.  A large skull flap was elevated exposing tense brain.  Dura was opened and a corticotomy was created in the frontal lobe and carried to the large hematoma cavity.  This was evacuated and a considerable amount of blood was removed.  Extensive irrigation was performed.  Hemostasis was assured with irrigation and cotton balls, Surgifoam, Hydrogen Peroxide soaked cotton balls.    Hemostasis was assured and the cavity was lined with Surgicell.  The brain was considerably more relaxed after hematoma evacuation, but because of  significant compression, I elected to place the skull flap in the abdomen.  An incision was made and carried to the fascia.  A subcutaneous pocket was created.  The bone flap was cut in two so as to fit better.  This was inserted and the incision was closed with 2-0 and 3-0 Vicryl sutures.  The dura was loosely placed over the brain and 3, 5 x 8 cm Duraguard patches were placed on the brain surface.  The fascia and galea were closed with 2-0 vicryl sutures and the skin was re approximated with staples.  Sterile occlusive dressings were placed.  Patient was returned to a supine position and taken to Neuro ICU for postop recovery.  PLAN OF CARE: Admit to inpatient   PATIENT DISPOSITION:  PACU - hemodynamically stable.   Delay start of Pharmacological VTE agent (>24hrs) due to surgical blood loss or risk of bleeding: yes

## 2019-09-09 NOTE — Consult Note (Signed)
Reason for Consult:ICH Referring Physician: [place]  Joshua [name] is an 38 y.o. male.  HPI:  (Per Dr. Cheral Marker) Joshua [name] is an 38 y.o. male presenting from home via EMS. He was inside of a possibly illicit smoking establishment with armed personnel that did not look official guarding the door [facility] [place] Smell inside was of "vape juice" with a strong fruity smell. There was smoke permeating the environment.   Girlfriend, friends and employees witnessed "a seizure" lasting "a few minutes". He threw up after the shaking stopped and was confused after. He was normal before the seizure. EMS noted urinary incontinence on arrival as well as bowel incontinence. In the ambulance he was moving his left side only. Seemed to realize there was paralysis of his RUE. He grabbed it with his LUE and brought it over his stomach and shaked it as if trying to make it move again. No RLE movement noted by EMS. He flexed at his LLE at the knee when noxious stimulus was applied to right hand. Was nonverbal and not following any commands. Vomited in association with the seizure with vomitus noted by EMS on arrival. During transport and in the ED the patient continued to vomit intermittently. He was not administered Ativan or Versed en route.   Was recently in the hospital for "EtOH poisoning" with a seizure per his brother who was at the establishment. Per brother this is the second time that the patient has had a seizure.   No past medical history on file.  No past surgical history on file.  No family history on file.  Social History:  has no history on file for tobacco, alcohol, and drug.  Allergies: No Known Allergies  Medications: I have reviewed the patient's current medications.  Results for orders placed or performed during the hospital encounter of 09/09/19 (from the past 48 hour(s))  Protime-INR     Status: None   Collection Time: 09/09/19  2:26 AM   Result Value Ref Range   Prothrombin Time 12.5 11.4 - 15.2 seconds   INR 0.9 0.8 - 1.2    Comment: (NOTE) INR goal varies based on device and disease states. Performed at [facility], [place] [address], [place], [place] [address]   APTT     Status: None   Collection Time: 09/09/19  2:26 AM  Result Value Ref Range   aPTT 29 24 - 36 seconds    Comment: Performed at [facility] [address] [place], [place] [address]  CBC     Status: Abnormal   Collection Time: 09/09/19  2:26 AM  Result Value Ref Range   WBC 11.1 (H) 4.0 - 10.5 K/uL   RBC 4.91 4.22 - 5.81 MIL/uL   Hemoglobin 14.5 13.0 - 17.0 g/dL   HCT 43.6 39.0 - 52.0 %   MCV 88.8 80.0 - 100.0 fL   MCH 29.5 26.0 - 34.0 pg   MCHC 33.3 30.0 - 36.0 g/dL   RDW 13.8 11.5 - 15.5 %   Platelets 204 150 - 400 K/uL   nRBC 0.0 0.0 - 0.2 %    Comment: Performed at [facility], [place] [address], [place],  [address]  Differential     Status: Abnormal   Collection Time: 09/09/19  2:26 AM  Result Value Ref Range   Neutrophils Relative % 32 %   Neutro Abs 3.6 1.7 - 7.7 K/uL   Lymphocytes Relative 54 %  Lymphs Abs 6.0 (H) 0.7 - 4.0 K/uL   Monocytes Relative 8 %   Monocytes Absolute 0.8 0.1 - 1.0 K/uL   Eosinophils Relative 5 %   Eosinophils Absolute 0.6 (H) 0.0 - 0.5 K/uL   Basophils Relative 1 %   Basophils Absolute 0.1 0.0 - 0.1 K/uL   Immature Granulocytes 0 %   Abs Immature Granulocytes 0.02 0.00 - 0.07 K/uL   Tear Drop Cells PRESENT     Comment: Performed at [facility] [address] [place], [place] [address]  Comprehensive metabolic panel     Status: Abnormal   Collection Time: 09/09/19  2:26 AM  Result Value Ref Range   Sodium 140 135 - 145 mmol/L   Potassium 2.4 (LL) 3.5 - 5.1 mmol/L    Comment: CRITICAL RESULT CALLED TO, READ BACK BY AND VERIFIED WITH: NEWNAM K,RN 09/09/19 0310 WAYK    Chloride 103 98 - 111 mmol/L   CO2 22 22 - 32 mmol/L   Glucose, Bld 165 (H) 70 - 99 mg/dL   BUN 22  (H) 6 - 20 mg/dL   Creatinine, Ser 2.29 (H) 0.61 - 1.24 mg/dL   Calcium 9.2 8.9 - 10.3 mg/dL   Total Protein 7.6 6.5 - 8.1 g/dL   Albumin 4.1 3.5 - 5.0 g/dL   AST 23 15 - 41 U/L   ALT 17 0 - 44 U/L   Alkaline Phosphatase 73 38 - 126 U/L   Total Bilirubin 0.4 0.3 - 1.2 mg/dL   GFR calc non Af Amer 35 (L) >60 mL/min   GFR calc Af Amer 40 (L) >60 mL/min   Anion gap 15 5 - 15    Comment: Performed at [facility], [address] [place], [place] [address]  I-stat chem 8, ED     Status: Abnormal   Collection Time: 09/09/19  2:35 AM  Result Value Ref Range   Sodium 140 135 - 145 mmol/L   Potassium 2.4 (LL) 3.5 - 5.1 mmol/L   Chloride 104 98 - 111 mmol/L   BUN 23 (H) 6 - 20 mg/dL   Creatinine, Ser 2.10 (H) 0.61 - 1.24 mg/dL   Glucose, Bld 161 (H) 70 - 99 mg/dL   Calcium, Ion 1.01 (L) 1.15 - 1.40 mmol/L   TCO2 22 22 - 32 mmol/L   Hemoglobin 15.6 13.0 - 17.0 g/dL   HCT 46.0 39.0 - 52.0 %   Comment NOTIFIED PHYSICIAN   I-STAT 7, (LYTES, BLD GAS, ICA, H+H)     Status: Abnormal   Collection Time: 09/09/19  4:12 AM  Result Value Ref Range   pH, Arterial 7.443 7.350 - 7.450   pCO2 arterial 44.4 32.0 - 48.0 mmHg   pO2, Arterial 592.0 (H) 83.0 - 108.0 mmHg   Bicarbonate 30.3 (H) 20.0 - 28.0 mmol/L   TCO2 32 22 - 32 mmol/L   O2 Saturation 100.0 %   Acid-Base Excess 5.0 (H) 0.0 - 2.0 mmol/L   Sodium 139 135 - 145 mmol/L   Potassium 2.1 (LL) 3.5 - 5.1 mmol/L   Calcium, Ion 1.13 (L) 1.15 - 1.40 mmol/L   HCT 43.0 39.0 - 52.0 %   Hemoglobin 14.6 13.0 - 17.0 g/dL   Patient temperature 98.7 F    Sample type ARTERIAL    Comment NOTIFIED PHYSICIAN     Dg Chest Port 1 View  Result Date: 09/09/2019 CLINICAL DATA:  ET and OG placement EXAM: PORTABLE CHEST 1 VIEW COMPARISON:  None. FINDINGS:  Transesophageal tube tip and side port distal to the GE junction terminating below the level of imaging. Endotracheal tube tip terminates appropriately in the mid trachea, approximately 5 cm from the  carina. Additional support devices overlie the chest. No consolidation, features of edema, pneumothorax, or effusion. Pulmonary vascularity is normally distributed. The cardiomediastinal contours are unremarkable. No acute osseous or soft tissue abnormality. IMPRESSION: No acute cardiopulmonary abnormality. Appropriate positioning of the endotracheal and transesophageal tubes. Electronically Signed   By: Lovena Le M.D.   On: 09/09/2019 03:30   Ct Head Code Stroke Wo Contrast  Result Date: 09/09/2019 CLINICAL DATA:  Code stroke.  Left-sided weakness EXAM: CT HEAD WITHOUT CONTRAST TECHNIQUE: Contiguous axial images were obtained from the base of the skull through the vertex without intravenous contrast. COMPARISON:  None. FINDINGS: Brain: There is an intraparenchymal hematoma centered in the left basal ganglia measuring 7.8 x 4.8 cm. There is rightward midline shift that measures 1.4 cm. There is early subfalcine herniation. Hemorrhage has extended into the lateral ventricles and third ventricle. There is a small focus of intraparenchymal hemorrhage at the right basal ganglia measuring approximately 4 mm. There is right ventricular entrapment with early dilatation of the temporal horn. Basal cisterns are effaced. Vascular: No hyperdense vessel or unexpected calcification. Skull: Normal. Negative for fracture or focal lesion. Sinuses/Orbits: No acute finding. Other: None. ASPECTS [facility] Stroke Program Early CT Score) is not applicable in the case of acute hemorrhage. IMPRESSION: 1. 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 2. Intraventricular extension of hemorrhage. Small focus of intraparenchymal hemorrhage in the right basal ganglia. Critical Value/emergent results were called by telephone at the time of interpretation on 09/09/2019 at 3:21 am to provider Dr. Kerney Elbe, Who verbally acknowledged  these results. Electronically Signed   By: Ulyses Jarred M.D.   On: 09/09/2019 03:42    Review of Systems - Negative except Htn, ETOH abuse, unknown substance abuse    Blood pressure 131/79, pulse 89, resp. rate 18, height 5\' 9"  (1.753 m), weight 63 kg, SpO2 100 %. Physical Exam  Constitutional: He appears well-developed and well-nourished.  HENT:  Head: Normocephalic and atraumatic.  Eyes: Left pupil is not reactive. Pupils are unequal.  Neurological: He is unresponsive. A cranial nerve deficit is present. GCS eye subscore is 1. GCS verbal subscore is 1. GCS motor subscore is 4.  Flaccid right side, weak withdrawal to noxious stimulus left leg.  No movement left arm to pain.    Assessment/Plan: Patient has large left hemispheric ICH with rapidly declining neurologic exam and enlarged left pupil.  He is nor moving his right side and weakly withdraws his left side.  He is not conscious and is not following commands.  Per discussion with Dr. Cheral Marker and with patient's brother Joshua [name], likelihood of recovery is poor, but given patient's age and recent normalcy, we will take him to surgery for left craniotomy with decompressive craniectomy and evacuation of ICH.  Patient's brother understands prognosis is poor.  Peggyann Shoals, MD 09/09/2019, 4:26 AM

## 2019-09-09 NOTE — ED Triage Notes (Signed)
Per ems girlfriend called 911 due to sezuire like activity. Pt found by ems at a "tobacco shop" which reportedly had a security guard, variety of smoking accessories and thick smoke in air. Reportedly brother appeared and informed EMS that pt had a hx of seizure activity r/t ETOH use. No meds given enroute

## 2019-09-09 NOTE — Progress Notes (Signed)
Pt temp upon arrival 91.0. Has now reached 101.3. MD paged. Order for cooling blanket received. Have ordered and waiting for it to arrive

## 2019-09-10 ENCOUNTER — Inpatient Hospital Stay (HOSPITAL_COMMUNITY): Payer: Medicaid Other

## 2019-09-10 DIAGNOSIS — G936 Cerebral edema: Secondary | ICD-10-CM | POA: Diagnosis present

## 2019-09-10 LAB — GLUCOSE, CAPILLARY
Glucose-Capillary: 123 mg/dL — ABNORMAL HIGH (ref 70–99)
Glucose-Capillary: 140 mg/dL — ABNORMAL HIGH (ref 70–99)
Glucose-Capillary: 145 mg/dL — ABNORMAL HIGH (ref 70–99)
Glucose-Capillary: 152 mg/dL — ABNORMAL HIGH (ref 70–99)
Glucose-Capillary: 160 mg/dL — ABNORMAL HIGH (ref 70–99)
Glucose-Capillary: 165 mg/dL — ABNORMAL HIGH (ref 70–99)
Glucose-Capillary: 209 mg/dL — ABNORMAL HIGH (ref 70–99)

## 2019-09-10 LAB — POCT ACTIVATED CLOTTING TIME: Activated Clotting Time: 0 seconds

## 2019-09-10 LAB — CBC WITH DIFFERENTIAL/PLATELET
Abs Immature Granulocytes: 0.25 10*3/uL — ABNORMAL HIGH (ref 0.00–0.07)
Basophils Absolute: 0 10*3/uL (ref 0.0–0.1)
Basophils Relative: 0 %
Eosinophils Absolute: 0 10*3/uL (ref 0.0–0.5)
Eosinophils Relative: 0 %
HCT: 31.3 % — ABNORMAL LOW (ref 39.0–52.0)
Hemoglobin: 10.7 g/dL — ABNORMAL LOW (ref 13.0–17.0)
Immature Granulocytes: 1 %
Lymphocytes Relative: 4 %
Lymphs Abs: 1 10*3/uL (ref 0.7–4.0)
MCH: 30.1 pg (ref 26.0–34.0)
MCHC: 34.2 g/dL (ref 30.0–36.0)
MCV: 88.2 fL (ref 80.0–100.0)
Monocytes Absolute: 1.6 10*3/uL — ABNORMAL HIGH (ref 0.1–1.0)
Monocytes Relative: 7 %
Neutro Abs: 21.3 10*3/uL — ABNORMAL HIGH (ref 1.7–7.7)
Neutrophils Relative %: 88 %
Platelets: 147 10*3/uL — ABNORMAL LOW (ref 150–400)
RBC: 3.55 MIL/uL — ABNORMAL LOW (ref 4.22–5.81)
RDW: 14.6 % (ref 11.5–15.5)
WBC: 24.3 10*3/uL — ABNORMAL HIGH (ref 4.0–10.5)
nRBC: 0 % (ref 0.0–0.2)

## 2019-09-10 LAB — BASIC METABOLIC PANEL
Anion gap: 8 (ref 5–15)
BUN: 26 mg/dL — ABNORMAL HIGH (ref 6–20)
CO2: 20 mmol/L — ABNORMAL LOW (ref 22–32)
Calcium: 8.7 mg/dL — ABNORMAL LOW (ref 8.9–10.3)
Chloride: 109 mmol/L (ref 98–111)
Creatinine, Ser: 2.17 mg/dL — ABNORMAL HIGH (ref 0.61–1.24)
GFR calc Af Amer: 43 mL/min — ABNORMAL LOW (ref >60)
GFR calc non Af Amer: 37 mL/min — ABNORMAL LOW (ref >60)
Glucose, Bld: 133 mg/dL — ABNORMAL HIGH (ref 70–99)
Potassium: 3.9 mmol/L (ref 3.5–5.1)
Sodium: 137 mmol/L (ref 135–145)

## 2019-09-10 LAB — TRIGLYCERIDES: Triglycerides: 115 mg/dL (ref <150)

## 2019-09-10 LAB — PHOSPHORUS: Phosphorus: 1.3 mg/dL — ABNORMAL LOW (ref 2.5–4.6)

## 2019-09-10 LAB — SODIUM
Sodium: 137 mmol/L (ref 135–145)
Sodium: 142 mmol/L (ref 135–145)
Sodium: 140 mmol/L (ref 135–145)

## 2019-09-10 LAB — MAGNESIUM: Magnesium: 2.1 mg/dL (ref 1.7–2.4)

## 2019-09-10 MED ORDER — INSULIN ASPART 100 UNIT/ML ~~LOC~~ SOLN
0.0000 [IU] | SUBCUTANEOUS | Status: DC
  Administered 2019-09-10: 3 [IU] via SUBCUTANEOUS
  Administered 2019-09-10: 2 [IU] via SUBCUTANEOUS
  Administered 2019-09-10 (×2): 3 [IU] via SUBCUTANEOUS
  Administered 2019-09-10: 20:00:00 2 [IU] via SUBCUTANEOUS
  Administered 2019-09-10: 5 [IU] via SUBCUTANEOUS
  Administered 2019-09-11 (×4): 2 [IU] via SUBCUTANEOUS
  Administered 2019-09-11: 3 [IU] via SUBCUTANEOUS
  Administered 2019-09-12 – 2019-09-15 (×19): 2 [IU] via SUBCUTANEOUS
  Administered 2019-09-15 – 2019-09-16 (×2): 3 [IU] via SUBCUTANEOUS
  Administered 2019-09-16 – 2019-09-17 (×4): 2 [IU] via SUBCUTANEOUS
  Administered 2019-09-17: 3 [IU] via SUBCUTANEOUS
  Administered 2019-09-17 – 2019-09-18 (×3): 2 [IU] via SUBCUTANEOUS
  Administered 2019-09-18: 3 [IU] via SUBCUTANEOUS
  Administered 2019-09-19 – 2019-09-26 (×23): 2 [IU] via SUBCUTANEOUS
  Administered 2019-09-26: 3 [IU] via SUBCUTANEOUS
  Administered 2019-09-26 – 2019-09-30 (×18): 2 [IU] via SUBCUTANEOUS
  Administered 2019-09-30 (×3): 3 [IU] via SUBCUTANEOUS
  Administered 2019-09-30 – 2019-10-01 (×6): 2 [IU] via SUBCUTANEOUS
  Administered 2019-10-01: 3 [IU] via SUBCUTANEOUS
  Administered 2019-10-02 – 2019-10-05 (×16): 2 [IU] via SUBCUTANEOUS
  Administered 2019-10-05: 3 [IU] via SUBCUTANEOUS
  Administered 2019-10-06 (×4): 2 [IU] via SUBCUTANEOUS
  Administered 2019-10-06: 3 [IU] via SUBCUTANEOUS
  Administered 2019-10-07 – 2019-10-09 (×5): 2 [IU] via SUBCUTANEOUS
  Administered 2019-10-09: 3 [IU] via SUBCUTANEOUS
  Administered 2019-10-10 (×2): 2 [IU] via SUBCUTANEOUS
  Administered 2019-10-10: 3 [IU] via SUBCUTANEOUS
  Administered 2019-10-10 – 2019-10-26 (×32): 2 [IU] via SUBCUTANEOUS
  Administered 2019-10-26: 3 [IU] via SUBCUTANEOUS
  Administered 2019-10-27 – 2019-12-01 (×60): 2 [IU] via SUBCUTANEOUS
  Administered 2019-12-02 (×2): 3 [IU] via SUBCUTANEOUS
  Administered 2019-12-03 – 2019-12-12 (×11): 2 [IU] via SUBCUTANEOUS
  Administered 2019-12-12: 21:00:00 1 [IU] via SUBCUTANEOUS
  Administered 2019-12-13: 13:00:00 2 [IU] via SUBCUTANEOUS
  Administered 2019-12-13 – 2019-12-15 (×2): 3 [IU] via SUBCUTANEOUS
  Administered 2019-12-16 – 2019-12-18 (×4): 2 [IU] via SUBCUTANEOUS
  Administered 2019-12-18: 3 [IU] via SUBCUTANEOUS
  Administered 2019-12-19 – 2019-12-21 (×5): 2 [IU] via SUBCUTANEOUS
  Administered 2019-12-21: 1 [IU] via SUBCUTANEOUS
  Administered 2019-12-22: 3 [IU] via SUBCUTANEOUS
  Administered 2019-12-23: 2 [IU] via SUBCUTANEOUS
  Administered 2019-12-23 (×2): 3 [IU] via SUBCUTANEOUS
  Administered 2019-12-24: 2 [IU] via SUBCUTANEOUS
  Administered 2019-12-24: 3 [IU] via SUBCUTANEOUS
  Administered 2019-12-24 – 2019-12-26 (×7): 2 [IU] via SUBCUTANEOUS
  Administered 2019-12-27: 3 [IU] via SUBCUTANEOUS
  Administered 2019-12-28: 2 [IU] via SUBCUTANEOUS
  Administered 2019-12-29: 3 [IU] via SUBCUTANEOUS
  Administered 2019-12-30 (×3): 2 [IU] via SUBCUTANEOUS
  Administered 2019-12-31 – 2020-01-02 (×3): 3 [IU] via SUBCUTANEOUS
  Administered 2020-01-03 – 2020-01-04 (×2): 2 [IU] via SUBCUTANEOUS
  Administered 2020-01-04 – 2020-01-06 (×2): 3 [IU] via SUBCUTANEOUS
  Administered 2020-01-07 – 2020-01-10 (×3): 2 [IU] via SUBCUTANEOUS
  Administered 2020-01-12: 3 [IU] via SUBCUTANEOUS
  Administered 2020-01-12 – 2020-01-14 (×2): 2 [IU] via SUBCUTANEOUS
  Administered 2020-01-14: 5 [IU] via SUBCUTANEOUS
  Administered 2020-01-15: 2 [IU] via SUBCUTANEOUS
  Administered 2020-01-15: 3 [IU] via SUBCUTANEOUS
  Administered 2020-01-15: 2 [IU] via SUBCUTANEOUS
  Administered 2020-01-16 (×3): 3 [IU] via SUBCUTANEOUS
  Administered 2020-01-17: 2 [IU] via SUBCUTANEOUS
  Administered 2020-01-17 – 2020-01-18 (×2): 3 [IU] via SUBCUTANEOUS
  Administered 2020-01-18 – 2020-01-20 (×5): 2 [IU] via SUBCUTANEOUS
  Administered 2020-01-20 – 2020-01-21 (×3): 3 [IU] via SUBCUTANEOUS
  Administered 2020-01-21 – 2020-01-22 (×4): 2 [IU] via SUBCUTANEOUS
  Administered 2020-01-23: 18:00:00 5 [IU] via SUBCUTANEOUS
  Administered 2020-01-23: 01:00:00 2 [IU] via SUBCUTANEOUS
  Administered 2020-01-24: 3 [IU] via SUBCUTANEOUS
  Administered 2020-01-24: 2 [IU] via SUBCUTANEOUS
  Administered 2020-01-26 – 2020-01-27 (×3): 3 [IU] via SUBCUTANEOUS
  Administered 2020-01-28: 5 [IU] via SUBCUTANEOUS
  Administered 2020-01-29: 3 [IU] via SUBCUTANEOUS
  Administered 2020-01-29 – 2020-01-30 (×2): 2 [IU] via SUBCUTANEOUS
  Administered 2020-01-31 – 2020-02-01 (×2): 3 [IU] via SUBCUTANEOUS
  Administered 2020-02-01 – 2020-02-04 (×4): 2 [IU] via SUBCUTANEOUS
  Administered 2020-02-05: 3 [IU] via SUBCUTANEOUS
  Administered 2020-02-06 – 2020-02-12 (×6): 2 [IU] via SUBCUTANEOUS
  Administered 2020-02-12: 3 [IU] via SUBCUTANEOUS
  Administered 2020-02-13 – 2020-02-25 (×5): 2 [IU] via SUBCUTANEOUS
  Administered 2020-02-26 – 2020-02-27 (×3): 3 [IU] via SUBCUTANEOUS
  Administered 2020-02-28: 2 [IU] via SUBCUTANEOUS
  Administered 2020-02-29: 3 [IU] via SUBCUTANEOUS
  Administered 2020-03-01 – 2020-03-07 (×7): 2 [IU] via SUBCUTANEOUS
  Administered 2020-03-08: 3 [IU] via SUBCUTANEOUS
  Administered 2020-03-09: 2 [IU] via SUBCUTANEOUS
  Administered 2020-03-10: 3 [IU] via SUBCUTANEOUS
  Administered 2020-03-11 – 2020-03-15 (×7): 2 [IU] via SUBCUTANEOUS
  Administered 2020-03-17: 3 [IU] via SUBCUTANEOUS
  Administered 2020-03-17 – 2020-03-18 (×2): 2 [IU] via SUBCUTANEOUS
  Administered 2020-03-18: 3 [IU] via SUBCUTANEOUS
  Administered 2020-03-18 – 2020-03-19 (×2): 2 [IU] via SUBCUTANEOUS
  Administered 2020-03-20: 3 [IU] via SUBCUTANEOUS
  Administered 2020-03-22 – 2020-03-26 (×3): 2 [IU] via SUBCUTANEOUS
  Administered 2020-03-27: 3 [IU] via SUBCUTANEOUS
  Administered 2020-03-28: 2 [IU] via SUBCUTANEOUS
  Administered 2020-03-29 – 2020-03-30 (×2): 3 [IU] via SUBCUTANEOUS
  Administered 2020-04-02: 2 [IU] via SUBCUTANEOUS
  Administered 2020-04-02: 3 [IU] via SUBCUTANEOUS
  Administered 2020-04-04 – 2020-04-08 (×4): 2 [IU] via SUBCUTANEOUS
  Administered 2020-04-09: 01:00:00 3 [IU] via SUBCUTANEOUS
  Administered 2020-04-10: 21:00:00 2 [IU] via SUBCUTANEOUS
  Administered 2020-04-10: 3 [IU] via SUBCUTANEOUS
  Administered 2020-04-11 – 2020-04-12 (×3): 2 [IU] via SUBCUTANEOUS
  Administered 2020-04-18: 13:00:00 3 [IU] via SUBCUTANEOUS
  Administered 2020-04-20 – 2020-04-22 (×5): 2 [IU] via SUBCUTANEOUS
  Administered 2020-04-24 (×2): 3 [IU] via SUBCUTANEOUS
  Administered 2020-04-25 – 2020-05-02 (×4): 2 [IU] via SUBCUTANEOUS

## 2019-09-10 MED ORDER — LEVETIRACETAM 100 MG/ML PO SOLN
500.0000 mg | Freq: Two times a day (BID) | ORAL | Status: DC
  Administered 2019-09-10 – 2020-01-07 (×235): 500 mg
  Filled 2019-09-10 (×244): qty 5

## 2019-09-10 MED ORDER — SODIUM CHLORIDE 3 % IV SOLN
INTRAVENOUS | Status: AC
  Administered 2019-09-10 (×2): 50 mL/h via INTRAVENOUS
  Administered 2019-09-11: 75 mL/h via INTRAVENOUS
  Filled 2019-09-10 (×3): qty 500

## 2019-09-10 MED ORDER — HYDROCODONE-ACETAMINOPHEN 5-325 MG PO TABS
1.0000 | ORAL_TABLET | ORAL | Status: DC | PRN
  Administered 2019-09-10 – 2019-09-12 (×5): 1
  Filled 2019-09-10 (×5): qty 1

## 2019-09-10 MED ORDER — LISINOPRIL 10 MG PO TABS: 10.0000 mg | ORAL_TABLET | Freq: Every day | ORAL | Status: DC

## 2019-09-10 MED ORDER — IOHEXOL 350 MG/ML SOLN
75.0000 mL | Freq: Once | INTRAVENOUS | Status: AC | PRN
  Administered 2019-09-10: 05:00:00 75 mL via INTRAVENOUS

## 2019-09-10 MED ORDER — FENTANYL CITRATE (PF) 100 MCG/2ML IJ SOLN
100.0000 ug | INTRAMUSCULAR | Status: DC | PRN
  Administered 2019-09-11 – 2019-09-15 (×32): 100 ug via INTRAVENOUS
  Filled 2019-09-10 (×34): qty 2

## 2019-09-10 MED ORDER — LISINOPRIL 10 MG PO TABS
10.0000 mg | ORAL_TABLET | Freq: Every day | ORAL | Status: DC
  Administered 2019-09-10: 10 mg
  Filled 2019-09-10 (×2): qty 1

## 2019-09-10 MED ORDER — DOCUSATE SODIUM 50 MG/5ML PO LIQD
100.0000 mg | Freq: Two times a day (BID) | ORAL | Status: DC
  Administered 2019-09-10 – 2019-09-13 (×7): 100 mg
  Filled 2019-09-10 (×7): qty 10

## 2019-09-10 NOTE — Progress Notes (Signed)
Subjective: Patient reports intubated, sedated  Objective: Vital signs in last 24 hours: Temp:  [91.6 F (33.1 C)-101.7 F (38.7 C)] 99.9 F (37.7 C) (09/13 0801) Pulse Rate:  [59-120] 65 (09/13 0801) Resp:  [12-35] 12 (09/13 0801) BP: (112-151)/(77-115) 127/89 (09/13 0700) SpO2:  [94 %-100 %] 99 % (09/13 0801) Arterial Line BP: (124-158)/(69-100) 140/77 (09/13 0700) FiO2 (%):  [30 %-40 %] 40 % (09/13 0801) Weight:  [64.3 kg] 64.3 kg (09/13 0500)  Intake/Output from previous day: 09/12 0701 - 09/13 0700 In: 4849.5 [I.V.:3269.1; NG/GT:848.8; IV Piggyback:731.6] Out: 2280 [Urine:2280] Intake/Output this shift: No intake/output data recorded.  Physical Exam: Pupils now reactive and equal.  Purposeful on left arm and leg, withdraws right leg > arm. Not following commands. Dressing with minor staining.   Lab Results: Recent Labs    09/09/19 0226  09/09/19 0412 09/10/19 0449  WBC 11.1*  --   --  24.3*  HGB 14.5   < > 14.6 10.7*  HCT 43.6   < > 43.0 31.3*  PLT 204  --   --  147*   < > = values in this interval not displayed.   BMET Recent Labs    09/09/19 0852 09/10/19 0449  NA 136 137  K 3.9 3.9  CL 104 109  CO2 20* 20*  GLUCOSE 99 133*  BUN 21* 26*  CREATININE 2.08* 2.17*  CALCIUM 8.1* 8.7*    Studies/Results: Ct Angio Head W Or Wo Contrast  Result Date: 09/10/2019 CLINICAL DATA:  Intracranial hemorrhage follow up EXAM: CT HEAD WITHOUT CONTRAST CT ANGIOGRAPHY OF THE HEAD TECHNIQUE: Contiguous axial images were obtained from the base of the skull through the vertex without intravenous contrast. Multidetector CT imaging of the head was performed using the standard protocol during bolus administration of intravenous contrast. Multiplanar CT image reconstructions and MIPs were obtained to evaluate the vascular anatomy. CONTRAST:  1mL OMNIPAQUE IOHEXOL 350 MG/ML SOLN COMPARISON:  Head CT 09/09/2019 FINDINGS: CT HEAD FINDINGS Brain: Status post hematoma evacuation.  Residual hematoma with interspersed postoperative gas measures 6.1 x 2.9 cm, centered in the left basal ganglia. Entrapment of the right lateral ventricle temporal horn has resolved. Volume a blood within the lateral ventricles is unchanged. Punctate focus of hemorrhage at the posterior right insula is unchanged. There is now extra-axial blood over the left convexity underlying the craniectomy site. Midline shift has improved, now measuring 6 mm to the right. Skull: Left pterional craniectomy Sinuses/Orbits: Negative CTA HEAD FINDINGS POSTERIOR CIRCULATION: --Vertebral arteries: Normal V4 segments. --Posterior inferior cerebellar arteries (PICA): Patent origins from the vertebral arteries. --Anterior inferior cerebellar arteries (AICA): Patent origins from the basilar artery. --Basilar artery: Normal. --Superior cerebellar arteries: Normal. --Posterior cerebral arteries: Normal. Both originate from the basilar artery. Posterior communicating arteries (p-comm) are diminutive or absent. ANTERIOR CIRCULATION: --Intracranial internal carotid arteries: Normal. --Anterior cerebral arteries (ACA): Normal. Both A1 segments are present. Patent anterior communicating artery (a-comm). --Middle cerebral arteries (MCA): Normal. VENOUS SINUSES: As permitted by contrast timing, patent. ANATOMIC VARIANTS: None Review of the MIP images confirms the above findings. IMPRESSION: 1. Status post left pterional craniectomy for evacuation of left basal ganglia hematoma. Residual hematoma with interspersed postoperative gas measures 6.1 x 2.9 cm. 2. Decreased midline shift, now measuring 6 mm to the right. 3. Unchanged punctate focus of hemorrhage at the posterior right insula. 4. Unchanged intraventricular blood. 5. No intracranial arterial occlusion or high-grade stenosis. No aneurysm or vascular malformation. Electronically Signed   By: Cletus Gash.D.  On: 09/10/2019 05:58   Ct Head Wo Contrast  Result Date: 09/10/2019 CLINICAL  DATA:  Intracranial hemorrhage follow up EXAM: CT HEAD WITHOUT CONTRAST CT ANGIOGRAPHY OF THE HEAD TECHNIQUE: Contiguous axial images were obtained from the base of the skull through the vertex without intravenous contrast. Multidetector CT imaging of the head was performed using the standard protocol during bolus administration of intravenous contrast. Multiplanar CT image reconstructions and MIPs were obtained to evaluate the vascular anatomy. CONTRAST:  12mL OMNIPAQUE IOHEXOL 350 MG/ML SOLN COMPARISON:  Head CT 09/09/2019 FINDINGS: CT HEAD FINDINGS Brain: Status post hematoma evacuation. Residual hematoma with interspersed postoperative gas measures 6.1 x 2.9 cm, centered in the left basal ganglia. Entrapment of the right lateral ventricle temporal horn has resolved. Volume a blood within the lateral ventricles is unchanged. Punctate focus of hemorrhage at the posterior right insula is unchanged. There is now extra-axial blood over the left convexity underlying the craniectomy site. Midline shift has improved, now measuring 6 mm to the right. Skull: Left pterional craniectomy Sinuses/Orbits: Negative CTA HEAD FINDINGS POSTERIOR CIRCULATION: --Vertebral arteries: Normal V4 segments. --Posterior inferior cerebellar arteries (PICA): Patent origins from the vertebral arteries. --Anterior inferior cerebellar arteries (AICA): Patent origins from the basilar artery. --Basilar artery: Normal. --Superior cerebellar arteries: Normal. --Posterior cerebral arteries: Normal. Both originate from the basilar artery. Posterior communicating arteries (p-comm) are diminutive or absent. ANTERIOR CIRCULATION: --Intracranial internal carotid arteries: Normal. --Anterior cerebral arteries (ACA): Normal. Both A1 segments are present. Patent anterior communicating artery (a-comm). --Middle cerebral arteries (MCA): Normal. VENOUS SINUSES: As permitted by contrast timing, patent. ANATOMIC VARIANTS: None Review of the MIP images confirms  the above findings. IMPRESSION: 1. Status post left pterional craniectomy for evacuation of left basal ganglia hematoma. Residual hematoma with interspersed postoperative gas measures 6.1 x 2.9 cm. 2. Decreased midline shift, now measuring 6 mm to the right. 3. Unchanged punctate focus of hemorrhage at the posterior right insula. 4. Unchanged intraventricular blood. 5. No intracranial arterial occlusion or high-grade stenosis. No aneurysm or vascular malformation. Electronically Signed   By: Ulyses Jarred M.D.   On: 09/10/2019 05:58   Dg Chest Port 1 View  Result Date: 09/09/2019 CLINICAL DATA:  Respiratory failure. EXAM: PORTABLE CHEST 1 VIEW COMPARISON:  09/09/2019 FINDINGS: Endotracheal tube tip 5 cm above the carina. Enteric tube courses into the stomach and off the film as tip is not visualized. Lungs are adequately inflated with mild elevation of the right hemidiaphragm. There is new hazy airspace opacification over the right mid to lower lung likely atelectasis. Left lung is clear. Cardiomediastinal silhouette and remainder of the exam is unchanged. IMPRESSION: New hazy opacification over the right midlung likely atelectasis. Tubes and lines as described. Electronically Signed   By: Marin Olp M.D.   On: 09/09/2019 11:22   Dg Chest Port 1 View  Result Date: 09/09/2019 CLINICAL DATA:  ET and OG placement EXAM: PORTABLE CHEST 1 VIEW COMPARISON:  None. FINDINGS: Transesophageal tube tip and side port distal to the GE junction terminating below the level of imaging. Endotracheal tube tip terminates appropriately in the mid trachea, approximately 5 cm from the carina. Additional support devices overlie the chest. No consolidation, features of edema, pneumothorax, or effusion. Pulmonary vascularity is normally distributed. The cardiomediastinal contours are unremarkable. No acute osseous or soft tissue abnormality. IMPRESSION: No acute cardiopulmonary abnormality. Appropriate positioning of the  endotracheal and transesophageal tubes. Electronically Signed   By: Lovena Le M.D.   On: 09/09/2019 03:30   Ct Head  Code Stroke Wo Contrast  Result Date: 09/09/2019 CLINICAL DATA:  Code stroke.  Left-sided weakness EXAM: CT HEAD WITHOUT CONTRAST TECHNIQUE: Contiguous axial images were obtained from the base of the skull through the vertex without intravenous contrast. COMPARISON:  None. FINDINGS: Brain: There is an intraparenchymal hematoma centered in the left basal ganglia measuring 7.8 x 4.8 cm. There is rightward midline shift that measures 1.4 cm. There is early subfalcine herniation. Hemorrhage has extended into the lateral ventricles and third ventricle. There is a small focus of intraparenchymal hemorrhage at the right basal ganglia measuring approximately 4 mm. There is right ventricular entrapment with early dilatation of the temporal horn. Basal cisterns are effaced. Vascular: No hyperdense vessel or unexpected calcification. Skull: Normal. Negative for fracture or focal lesion. Sinuses/Orbits: No acute finding. Other: None. ASPECTS [facility] Stroke Program Early CT Score) is not applicable in the case of acute hemorrhage. IMPRESSION: 1. 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 2. Intraventricular extension of hemorrhage. Small focus of intraparenchymal hemorrhage in the right basal ganglia. Critical Value/emergent results were called by telephone at the time of interpretation on 09/09/2019 at 3:21 am to provider Dr. Kerney Elbe, Who verbally acknowledged these results. Electronically Signed   By: Ulyses Jarred M.D.   On: 09/09/2019 03:42    Assessment/Plan: Exam is much improved.  Head CT shows improvement in ICH volume and shift.  Continue support, 3% NaCl per Stroke Service.    LOS: 1 day    Peggyann Shoals, MD 09/10/2019, 8:39 AM

## 2019-09-10 NOTE — Progress Notes (Signed)
Contacted [facility] about patient's CBGs. Spoke to [employer] who said they would speak with [facility] MD. Last CBG was 209. Will continue to monitor.

## 2019-09-10 NOTE — Progress Notes (Signed)
[facility] Physician-Brief Progress Note [name] DOB: 20-Jul-1981 MRN: [id number]   Date of Service  09/10/2019  HPI/Events of Note  Agitation - Associated with hypertension and patient attempting to reach for ETT.   eICU Interventions  Will order: 1. Increase Fentanyl 100 mcg IV to Q  1 hour PRN.      Intervention Category Major Interventions: Delirium, psychosis, severe agitation - evaluation and management  Zaylyn Bergdoll [place] 09/10/2019, 9:14 PM

## 2019-09-10 NOTE — Progress Notes (Signed)
[facility]Link Physician-Brief Progress Note Patient Name: [name] DOB: 1981/07/08 MRN: [id number]   Date of Service  09/10/2019  HPI/Events of Note  Notified of hyperglycemia with glucoses 216, 209.  Pt is on tube feeds.   eICU Interventions  Start on insulin sliding scale.     Intervention Category Intermediate Interventions: Hyperglycemia - evaluation and treatment  Elsie Lincoln 09/10/2019, 12:52 AM

## 2019-09-10 NOTE — Progress Notes (Signed)
Spoke to Neurosurgery MD Vertell Limber. Patient is being purposeful with LUE and I am concerned about the ETT and patient's head with bone flap missing. Dr Vertell Limber said that a restraint to LUE is acceptable. Will continue to monitor.

## 2019-09-10 NOTE — Progress Notes (Signed)
PULMONARY / CRITICAL CARE MEDICINE   NAME:  Joshua [name], MRN:  [id number], DOB:  November 19, 1981, LOS: 1 ADMISSION DATE:  09/09/2019, CONSULTATION DATE: 09/09/2019 REFERRING MD: Neurosurgery, CHIEF COMPLAINT: Vent dependent respiratory failure secondary to intracranial hemorrhage  BRIEF HISTORY:    38 year old history of alcohol abuse seizures noncompliant hypertension who presented with new seizure right-sided hemiplegia confusion was found to have a large left intracranial hemorrhage for which he underwent craniectomy.  HISTORY OF PRESENT ILLNESS   38 year old male with a history of alcohol abuse to the point of alcohol poisoning resulting in a seizure.  He was in a drug house and was noted to have a seizure and woke up with right-sided hemiplegia.  He was noted to have vomited, urinary incontinence, bowel incontinence with confusion.  He was nonverbal and not following commands.  EMS was activated he is transported to [facility] evaluated by neurology and neurosurgery and was taken to the operating room for urgent left frontal temporal craniectomy evacuation hematoma with skull flap implantation. He was admitted to NSICU intubated.  SIGNIFICANT PAST MEDICAL HISTORY   Hypertension Alcohol abuse  SIGNIFICANT EVENTS:  09/08/2021 operating room for left frontal temporal craniectomy and evacuation hematoma STUDIES:   CT of head 09/09/2019 large left intracerebral hemorrhage CT of head 09/10/2019-decreased size of left basal ganglia hematoma with expected postoperative air.  Mild improvement of associated mass-effect.  Redistribution of intraventricular blood. CULTURES:  09/09/2019 sputum>> moderate white blood cells, no organisms.  ANTIBIOTICS:  None  LINES/TUBES:  09/09/2019 endotracheal tube 09/09/2019 orogastric tube 09/09/2019 right radial A-line  CONSULTANTS:  09/09/2019 pulmonary critical care 09/09/2019 neurology SUBJECTIVE:  Weaned off propofol, moves left side spontaneously     CONSTITUTIONAL: BP 127/89   Pulse 65   Temp 99.9 F (37.7 C)   Resp 12   Ht 5\' 9"  (1.753 m)   Wt 64.3 kg   SpO2 99%   BMI 20.93 kg/m   I/O last 3 completed shifts: In: 5981.3 [I.V.:4269.1; NG/GT:848.8; IV Piggyback:863.3] Out: 3130 [Urine:2880; Blood:250]     Vent Mode: PRVC FiO2 (%):  [30 %-40 %] 40 % Set Rate:  [18 bmp] 18 bmp Vt Set:  [560 mL] 560 mL PEEP:  [5 cmH20] 5 cmH20 Plateau Pressure:  [16 cmH20-20 cmH20] 16 cmH20  PHYSICAL EXAM: General: Thin man intubated, no sedation. Calm. Neuro: No response to voice. PERL. Moves left side semi-purposefully, occasional flicker of movement on the right side HEENT: Left cranial dressing with some blood saturation. Endotracheal tube in place gastric tube in place Cardiovascular: Heart sounds are regular regular rate and rhythm Lungs: Clear to auscultation, no ventilator dyssynchrony. Abdomen: Right mid quadrant skull flap implantation unremarkable.  Bowel sounds present and tolerating tube feed.  Musculoskeletal: Intact Skin: Dry and cool  RESOLVED PROBLEM LIST   ASSESSMENT AND PLAN   Critically ill due to ventilator dependent respiratory failure in the setting of large intracerebral hemorrhage left.  Continue full ventilatory support Initiate SBT as neurological status improves likely in 2 to 3 days from now.  Noted to have vomited at scene but no evidence of aspiration at this time. May account for febrile spike and leukocytosis. No hemodynamic instability and minimal ventilator requirements. Hold on antibiotics at this time as likely sterile aspiration.  Large left intracerebral hemorrhage status post left craniectomy with evacuation of hematoma with skull flap implantation right abdomen. Some evidence of midline shift on repeat CT in spite of decompressive craniotomy Currently at peak expected swelling. Initiate 3% to mitigate  cerebral edema Wean 3% off in 48 to 72 hours. May require EVD/intraventricular  shunt.  Blood pressure controlled status post intracerebral hemorrhage with goal systolic blood pressure less than 140.  History of hypertension with poor compliance with medication. Cleviprex drip PRN sedation only Initiate enteral antihypertensive: Amlodipine plus lisinopril (renal protective effects.  Kidney injury of unclear duration Given normal electrolytes likely chronic.  Polysubstance abuse Counseling if he survives Folic acid and thiamine initially IV transition to p.o. once tube feeds are stab  Best Practice / Goals of Care / Disposition.    DVT PROPHYLAXIS: SCDs, may start unfractionated heparin 48 hours postoperatively. SUP: PPI Pain agitation: PRN pain medication only NUTRITION: N.p.o. tube feedings MOBILITY: Bedrest GOALS OF CARE: Full code Foley catheter: Addition to condom catheter FAMILY DISCUSSIONS: Sister was updated at bedside yesterday DISPOSITION neurosurgical intensive care unit following left frontal temporal craniotomy  LABS  Glucose Recent Labs  Lab 09/09/19 1603 09/09/19 1922 09/10/19 0024 09/10/19 0403 09/10/19 0754  GLUCAP 166* 216* 209* 165* 160*    BMET Recent Labs  Lab 09/09/19 0226 09/09/19 0235 09/09/19 0412 09/09/19 0852 09/10/19 0449  NA 140 140 139 136 137  K 2.4* 2.4* 2.1* 3.9 3.9  CL 103 104  --  104 109  CO2 22  --   --  20* 20*  BUN 22* 23*  --  21* 26*  CREATININE 2.29* 2.10*  --  2.08* 2.17*  GLUCOSE 165* 161*  --  99 133*    Liver Enzymes Recent Labs  Lab 09/09/19 0226  AST 23  ALT 17  ALKPHOS 73  BILITOT 0.4  ALBUMIN 4.1    Electrolytes Recent Labs  Lab 09/09/19 0226 09/09/19 0407 09/09/19 0852 09/10/19 0449  CALCIUM 9.2  --  8.1* 8.7*  MG  --  1.9  --  2.1  PHOS  --  2.2*  --  1.3*    CBC Recent Labs  Lab 09/09/19 0226 09/09/19 0235 09/09/19 0412 09/10/19 0449  WBC 11.1*  --   --  24.3*  HGB 14.5 15.6 14.6 10.7*  HCT 43.6 46.0 43.0 31.3*  PLT 204  --   --  147*    ABG Recent Labs   Lab 09/09/19 0412 09/09/19 0835  PHART 7.443 7.418  PCO2ART 44.4 31.1*  PO2ART 592.0* 202*    Coag's Recent Labs  Lab 09/09/19 0226  APTT 29  INR 0.9    Sepsis Markers No results for input(s): LATICACIDVEN, PROCALCITON, O2SATVEN in the last 168 hours.  Cardiac Enzymes No results for input(s): TROPONINI, PROBNP in the last 168 hours.     CRITICAL CARE Performed by: Kipp Brood   Total critical care time: 40 minutes  Critical care time was exclusive of separately billable procedures and treating other patients.  Critical care was necessary to treat or prevent imminent or life-threatening deterioration.  Critical care was time spent personally by me on the following activities: development of treatment plan with patient and/or surrogate as well as nursing, discussions with consultants, evaluation of patient's response to treatment, examination of patient, obtaining history from patient or surrogate, ordering and performing treatments and interventions, ordering and review of laboratory studies, ordering and review of radiographic studies, pulse oximetry, re-evaluation of patient's condition and participation in multidisciplinary rounds.  Kipp Brood, MD [facility] ICU Physician [place]  Pager: [phone] Mobile: [phone] After hours: [phone].   09/10/2019, 9:56 AM

## 2019-09-10 NOTE — Progress Notes (Signed)
STROKE TEAM PROGRESS NOTE     INTERVAL HISTORY Patient is sedated with propofol.  Is slightly more responsive and has semipurposeful movements on the left side.  He has trace withdrawal in the right leg.  Blood pressure is adequately controlled.  Repeat CT scan of the head this morning shows improvement in the left basal ganglia hematoma with some air in the surgical bed with improved but persistent 5 mm left-to-right shift..  Discussed case with Dr. Vertell Limber.  No family at bedside today.    OBJECTIVE Vitals:   09/10/19 0615 09/10/19 0630 09/10/19 0645 09/10/19 0700  BP: 130/90 (!) 134/92 131/84 127/89  Pulse: 75 76 71 70  Resp: 20 20 18 18   Temp: 99.3 F (37.4 C) 99.5 F (37.5 C) 99.5 F (37.5 C) 99.7 F (37.6 C)  TempSrc:      SpO2: 100% 99% 99% 99%  Weight:      Height:        CBC:  Recent Labs  Lab 09/09/19 0226  09/09/19 0412 09/10/19 0449  WBC 11.1*  --   --  24.3*  NEUTROABS 3.6  --   --  21.3*  HGB 14.5   < > 14.6 10.7*  HCT 43.6   < > 43.0 31.3*  MCV 88.8  --   --  88.2  PLT 204  --   --  147*   < > = values in this interval not displayed.    Basic Metabolic Panel:  Recent Labs  Lab 09/09/19 0407  09/09/19 0852 09/10/19 0449  NA  --    < > 136 137  K  --    < > 3.9 3.9  CL  --   --  104 109  CO2  --   --  20* 20*  GLUCOSE  --   --  99 133*  BUN  --   --  21* 26*  CREATININE  --   --  2.08* 2.17*  CALCIUM  --   --  8.1* 8.7*  MG 1.9  --   --  2.1  PHOS 2.2*  --   --  1.3*   < > = values in this interval not displayed.    Lipid Panel:     Component Value Date/Time   TRIG 115 09/10/2019 0449   HgbA1c: No results found for: HGBA1C Urine Drug Screen:     Component Value Date/Time   LABOPIA NONE DETECTED 09/09/2019 0349   COCAINSCRNUR NONE DETECTED 09/09/2019 0349   LABBENZ NONE DETECTED 09/09/2019 0349   AMPHETMU NONE DETECTED 09/09/2019 0349   THCU NONE DETECTED 09/09/2019 0349   LABBARB NONE DETECTED 09/09/2019 0349    Alcohol Level      Component Value Date/Time   ETH <10 09/09/2019 0407    IMAGING  Dg Chest Port 1 View 09/09/2019 IMPRESSION:  No acute cardiopulmonary abnormality. Appropriate positioning of the endotracheal and transesophageal tubes.   Ct Head Code Stroke Wo Contrast 09/09/2019 IMPRESSION:  1. 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  2. Intraventricular extension of hemorrhage. Small focus of intraparenchymal hemorrhage in the right basal ganglia.   CTA Head  09/09/2019 IMPRESSION: 1. Status post left pterional craniectomy for evacuation of left basal ganglia hematoma. Residual hematoma with interspersed postoperative gas measures 6.1 x 2.9 cm. 2. Decreased midline shift, now measuring 6 mm to the right. 3. Unchanged punctate focus of  hemorrhage at the posterior right insula. 4. Unchanged intraventricular blood. 5. No intracranial arterial occlusion or high-grade stenosis. No aneurysm or vascular malformation.   ECG - SR rate 97 BPM. LVH and prolonged QT interval. (See cardiology reading for complete details)   EEG - not ordered   PHYSICAL EXAM Blood pressure 127/89, pulse 70, temperature 99.7 F (37.6 C), resp. rate 18, height 5\' 9"  (1.753 m), weight 64.3 kg, SpO2 99 %.  Neurological Exam ;  Patient is sedated intubated   Eyes are closed.  He does not follow any commands.  Pupils 2 mm sluggishly reactive.  Left gaze deviation doll's eye movements are absent.  Corneal reflexes are sluggish.  Fundi not visualized.  Face is symmetric.  Tongue midline.  Intermittent left upper and lower extremity spontaneous motor movements.  Semipurposeful localization on the left.  Trace flexion in the right lower extremity to pain.  No movement in the right upper extremity.  Plantars are downgoing.   ASSESSMENT/PLAN Joshua [name] is a 38 y.o. male with history of admission for Etoh poisoning  presenting with a seizure. He did not receive IV t-PA due to hemorrhage.  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 Intraventricular extension of hemorrhage. Small focus of intraparenchymal hemorrhage in the right basal ganglia.   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 Intraventricular extension of hemorrhage. Small focus of intraparenchymal hemorrhage in the right basal ganglia.   CT/CTA head - 09/09/2019 - Status post left pterional craniectomy for evacuation of left basal ganglia hematoma. Residual hematoma with interspersed postoperative gas measures 6.1 x 2.9 cm. Decreased midline shift, now measuring 6 mm to the right. No intracranial arterial occlusion or high-grade stenosis. No aneurysm or vascular malformation  MRI head - not ordered  MRA head - not ordered  CTA H&N - not ordered  CT Perfusion - not ordered  Carotid Doppler - not indicated  2D Echo - not indicated  EEG - not ordered  [employer] Virus 2 - negative  LDL - not ordered No results found for: LDLCALC  HgbA1c - No results found for requested labs within last 26280 hours.  UDS - negative  VTE prophylaxis - will order SCDs Diet  Diet Order            Diet NPO time specified  Diet effective now              No antithrombotic prior to admission, now on No antithrombotic  Ongoing aggressive stroke risk factor management  Therapy recommendations:  pending  Disposition:  Pending  Hypertension  Home BP meds: none  Current BP meds: Cleviprex  Blood pressure high at times  SBP goal < 160 mm Hg . Long-term BP goal normotensive  Hyperlipidemia  Home Lipid lowering medication: none  LDL - not ordered, goal <  70  Current lipid lowering medication: None  Continue statin at discharge  Nutrition  NPO  Tube Feedings started 09/09/19  Leukocytosis  11.1->24.3  Temp - 99.7  CXR - pending  2 doses Ancef yesterday (surgical porphylaxis)  Will order U/A and blood cultures - pending  Repeat CBC in AM for the next 2 days   Other Stroke Risk Factors  Family History - not  on file  Tobacco Use - not on file  ETOH Use - not on file  PMHX - not on file  Other Active Problems  Seizures - Now on Keppra 500 mg IV Bid  Intraparenchymal hematoma with midline shift -> decompressive craniectomy 09/09/19 Dr Vertell Limber  Hypokalemia- 2.1 -> supplemented->3.9  Intubated  Creatinine - 2.29->2.10->2.17  Probable undiagnosed / untreated hypertension (severe LVH on ECG)  Mild Thrombocytopenia - 204->147  Phos - 2.2-1.3    Hospital day # 1   Continue close neurological monitoring and strict blood pressure control with systolic blood pressure goal less than 160.  Continue blood pressure drip and resume home medications through NG tube.  Maintain normoglycemia and normothermia and euvolemia.  Start hypertonic saline drip controlling cytotoxic edema and insert PICC line.  Discussed with Dr. Lynetta Mare critical care medicine and Dr. Vertell Limber neurosurgery. This patient is critically ill and at significant risk of neurological worsening, death and care requires constant monitoring of vital signs, hemodynamics,respiratory and cardiac monitoring, extensive review of multiple databases, frequent neurological assessment, discussion with family, other specialists and medical decision making of high complexity.I have made any additions or clarifications directly to the above note.This critical care time does not reflect procedure time, or teaching time or supervisory time of PA/NP/Med Resident etc but could involve care discussion time.  I spent 30 minutes of neurocritical care time  in the care of  this  patient.   Antony Contras, MD Medical Director [facility] Pager: [phone] 09/10/2019 12:28 PM    To contact Stroke Continuity provider, please refer to [email or web address] After hours, contact General Neurology

## 2019-09-11 ENCOUNTER — Inpatient Hospital Stay: Payer: Self-pay

## 2019-09-11 ENCOUNTER — Encounter (HOSPITAL_COMMUNITY): Payer: Self-pay | Admitting: Neurosurgery

## 2019-09-11 DIAGNOSIS — Z9911 Dependence on respirator [ventilator] status: Secondary | ICD-10-CM

## 2019-09-11 DIAGNOSIS — G936 Cerebral edema: Secondary | ICD-10-CM

## 2019-09-11 DIAGNOSIS — J690 Pneumonitis due to inhalation of food and vomit: Secondary | ICD-10-CM

## 2019-09-11 LAB — GLUCOSE, CAPILLARY
Glucose-Capillary: 164 mg/dL — ABNORMAL HIGH (ref 70–99)
Glucose-Capillary: 153 mg/dL — ABNORMAL HIGH (ref 70–99)
Glucose-Capillary: 131 mg/dL — ABNORMAL HIGH (ref 70–99)
Glucose-Capillary: 148 mg/dL — ABNORMAL HIGH (ref 70–99)
Glucose-Capillary: 122 mg/dL — ABNORMAL HIGH (ref 70–99)
Glucose-Capillary: 139 mg/dL — ABNORMAL HIGH (ref 70–99)
Glucose-Capillary: 134 mg/dL — ABNORMAL HIGH (ref 70–99)

## 2019-09-11 LAB — CBC WITH DIFFERENTIAL/PLATELET
Abs Immature Granulocytes: 0.39 10*3/uL — ABNORMAL HIGH (ref 0.00–0.07)
Basophils Absolute: 0 10*3/uL (ref 0.0–0.1)
Basophils Relative: 0 %
Eosinophils Absolute: 0 10*3/uL (ref 0.0–0.5)
Eosinophils Relative: 0 %
HCT: 26 % — ABNORMAL LOW (ref 39.0–52.0)
Hemoglobin: 8.5 g/dL — ABNORMAL LOW (ref 13.0–17.0)
Immature Granulocytes: 2 %
Lymphocytes Relative: 5 %
Lymphs Abs: 0.9 10*3/uL (ref 0.7–4.0)
MCH: 29.8 pg (ref 26.0–34.0)
MCHC: 32.7 g/dL (ref 30.0–36.0)
MCV: 91.2 fL (ref 80.0–100.0)
Monocytes Absolute: 1.1 10*3/uL — ABNORMAL HIGH (ref 0.1–1.0)
Monocytes Relative: 6 %
Neutro Abs: 16.4 10*3/uL — ABNORMAL HIGH (ref 1.7–7.7)
Neutrophils Relative %: 87 %
Platelets: 124 10*3/uL — ABNORMAL LOW (ref 150–400)
RBC: 2.85 MIL/uL — ABNORMAL LOW (ref 4.22–5.81)
RDW: 15.4 % (ref 11.5–15.5)
WBC: 18.8 10*3/uL — ABNORMAL HIGH (ref 4.0–10.5)
nRBC: 0.2 % (ref 0.0–0.2)

## 2019-09-11 LAB — BASIC METABOLIC PANEL
Anion gap: 5 (ref 5–15)
BUN: 27 mg/dL — ABNORMAL HIGH (ref 6–20)
CO2: 20 mmol/L — ABNORMAL LOW (ref 22–32)
Calcium: 8.4 mg/dL — ABNORMAL LOW (ref 8.9–10.3)
Chloride: 121 mmol/L — ABNORMAL HIGH (ref 98–111)
Creatinine, Ser: 2.34 mg/dL — ABNORMAL HIGH (ref 0.61–1.24)
GFR calc Af Amer: 39 mL/min — ABNORMAL LOW (ref >60)
GFR calc non Af Amer: 34 mL/min — ABNORMAL LOW (ref >60)
Glucose, Bld: 155 mg/dL — ABNORMAL HIGH (ref 70–99)
Potassium: 4 mmol/L (ref 3.5–5.1)
Sodium: 146 mmol/L — ABNORMAL HIGH (ref 135–145)

## 2019-09-11 LAB — HEMOGLOBIN A1C
Hgb A1c MFr Bld: 5.6 % (ref 4.8–5.6)
Mean Plasma Glucose: 114 mg/dL

## 2019-09-11 LAB — SODIUM
Sodium: 150 mmol/L — ABNORMAL HIGH (ref 135–145)
Sodium: 153 mmol/L — ABNORMAL HIGH (ref 135–145)
Sodium: 155 mmol/L — ABNORMAL HIGH (ref 135–145)

## 2019-09-11 LAB — URINALYSIS, ROUTINE W REFLEX MICROSCOPIC
Bilirubin Urine: NEGATIVE
Glucose, UA: NEGATIVE mg/dL
Hgb urine dipstick: NEGATIVE
Ketones, ur: NEGATIVE mg/dL
Leukocytes,Ua: NEGATIVE
Nitrite: NEGATIVE
Protein, ur: NEGATIVE mg/dL
Specific Gravity, Urine: 1.011 (ref 1.005–1.030)
pH: 5 (ref 5.0–8.0)

## 2019-09-11 MED ORDER — SODIUM CHLORIDE 0.9% FLUSH
10.0000 mL | INTRAVENOUS | Status: DC | PRN
  Administered 2019-11-17 – 2019-11-23 (×3): 10 mL
  Filled 2019-09-11 (×3): qty 40

## 2019-09-11 MED ORDER — SODIUM CHLORIDE 3 % IV SOLN
INTRAVENOUS | Status: AC
  Administered 2019-09-11 (×2): 75 mL/h via INTRAVENOUS
  Filled 2019-09-11 (×4): qty 500

## 2019-09-11 MED ORDER — HEPARIN SODIUM (PORCINE) 5000 UNIT/ML IJ SOLN
5000.0000 [IU] | Freq: Three times a day (TID) | INTRAMUSCULAR | Status: DC
  Administered 2019-09-11 – 2019-10-05 (×71): 5000 [IU] via SUBCUTANEOUS
  Filled 2019-09-11 (×71): qty 1

## 2019-09-11 MED ORDER — ADULT MULTIVITAMIN LIQUID CH
15.0000 mL | Freq: Every day | ORAL | Status: DC
  Administered 2019-09-11 – 2020-05-02 (×212): 15 mL
  Filled 2019-09-11 (×230): qty 15

## 2019-09-11 MED ORDER — FOLIC ACID 1 MG PO TABS
1.0000 mg | ORAL_TABLET | Freq: Every day | ORAL | Status: DC
  Administered 2019-09-12 – 2020-04-18 (×202): 1 mg
  Filled 2019-09-11 (×214): qty 1

## 2019-09-11 MED ORDER — THIAMINE HCL 100 MG PO TABS
100.0000 mg | ORAL_TABLET | Freq: Every day | ORAL | Status: DC
  Administered 2019-09-12 – 2020-04-18 (×203): 100 mg
  Filled 2019-09-11 (×215): qty 1

## 2019-09-11 MED ORDER — SODIUM CHLORIDE 0.9% FLUSH
10.0000 mL | Freq: Two times a day (BID) | INTRAVENOUS | Status: DC
  Administered 2019-09-11: 20 mL
  Administered 2019-09-11: 10 mL
  Administered 2019-09-12: 20 mL
  Administered 2019-09-12: 10 mL
  Administered 2019-09-13: 30 mL
  Administered 2019-09-13: 20 mL
  Administered 2019-09-14 – 2019-09-16 (×3): 10 mL
  Administered 2019-09-16: 20 mL
  Administered 2019-09-17 – 2019-09-21 (×9): 10 mL
  Administered 2019-09-21: 20 mL
  Administered 2019-09-22 – 2019-12-25 (×74): 10 mL

## 2019-09-11 MED ORDER — LABETALOL HCL 100 MG PO TABS
100.0000 mg | ORAL_TABLET | Freq: Three times a day (TID) | ORAL | Status: DC
  Administered 2019-09-11 (×3): 100 mg via ORAL
  Filled 2019-09-11 (×3): qty 1

## 2019-09-11 MED ORDER — PIPERACILLIN-TAZOBACTAM 4.5 G IVPB: 4.5000 g | Freq: Three times a day (TID) | INTRAVENOUS | Status: DC

## 2019-09-11 MED ORDER — CHLORHEXIDINE GLUCONATE CLOTH 2 % EX PADS
6.0000 | MEDICATED_PAD | Freq: Every day | CUTANEOUS | Status: DC
  Administered 2019-09-12 – 2019-10-01 (×20): 6 via TOPICAL

## 2019-09-11 MED ORDER — VANCOMYCIN HCL 10 G IV SOLR
1500.0000 mg | Freq: Once | INTRAVENOUS | Status: AC
  Administered 2019-09-11: 1500 mg via INTRAVENOUS
  Filled 2019-09-11: qty 1500

## 2019-09-11 MED ORDER — VANCOMYCIN HCL IN DEXTROSE 1-5 GM/200ML-% IV SOLN
1000.0000 mg | INTRAVENOUS | Status: DC
  Administered 2019-09-12: 12:00:00 1000 mg via INTRAVENOUS
  Filled 2019-09-11: qty 200

## 2019-09-11 MED ORDER — PIPERACILLIN-TAZOBACTAM 3.375 G IVPB
3.3750 g | Freq: Three times a day (TID) | INTRAVENOUS | Status: DC
  Administered 2019-09-11 – 2019-09-14 (×9): 3.375 g via INTRAVENOUS
  Filled 2019-09-11 (×9): qty 50

## 2019-09-11 NOTE — Procedures (Signed)
Cortrak  Tube Type:  Cortrak - 43 inches Tube Location:  Left nare Initial Placement:  Stomach Secured by: Bridle Technique Used to Measure Tube Placement:  Documented cm marking at nare/ corner of mouth Cortrak Secured At:  69 cm    Cortrak Tube Team Note:  Consult received to place a Cortrak feeding tube.   No x-ray is required. RN may begin using tube.   If the tube becomes dislodged please keep the tube and contact the Cortrak team at [email or web address] ([email or web address]) for replacement.  If after hours and replacement cannot be delayed, place a NG tube and confirm placement with an abdominal x-ray.    Koleen Distance MS, RD, LDN Pager #- [phone] Office#- [phone] After Hours Pager: [phone]

## 2019-09-11 NOTE — Progress Notes (Signed)
Sodium level dropped from 142 to 140. Neurology made aware, 3% increased from 51mL to 19mL per hour. Will recheck sodium in 6 hours.

## 2019-09-11 NOTE — Progress Notes (Signed)
Pharmacy Antibiotic Note  Joshua [name] is a 38 y.o. male admitted on 09/09/2019 s/p seizures and R-sided hemiplegia now with possible aspiration pneumonia. Patient received two doses of perioperative Ancef on 9/12. Pharmacy has been consulted for Vancomycin dosing.  Patient is now shivering and has new onset of fever with Tmax 102.4. WBC elevated but trending downward at 18.8 from 24.3. Renal function is worsening with Scr 2.34 today. UOP was almost 1L on 9/13.   Plan: Vancomycin 1500 mg IV loading dose, then 1000 mg IV Q 24 hrs.   - Goal AUC 400-550. - Expected AUC: 541 - SCr used: 2.34 Monitor Cultures, renal function, clinical progression Consider vancomycin levels at steady state  Height: 5\' 9"  (175.3 cm) Weight: 142 lb 13.7 oz (64.8 kg) IBW/kg (Calculated) : 70.7  Temp (24hrs), Avg:99.5 F (37.5 C), Min:98.1 F (36.7 C), Max:102.4 F (39.1 C)  Recent Labs  Lab 09/09/19 0226 09/09/19 0235 09/09/19 0852 09/10/19 0449 09/11/19 0445  WBC 11.1*  --   --  24.3* 18.8*  CREATININE 2.29* 2.10* 2.08* 2.17* 2.34*    Estimated Creatinine Clearance: 38.8 mL/min (A) (by C-G formula based on SCr of 2.34 mg/dL (H)).    No Known Allergies  Antimicrobials this admission: Cefazolin 9/12 x2 Vancomycin 9/14 >> Zosyn 9/14 >>  Dose adjustments this admission: N/A  Microbiology results: 9/14 Sputum:  sent 9/12 Sputum: mod WBC  9/12 MRSA PCR: neg  Richardine Service, PharmD PGY1 Pharmacy Resident Phone: [phone] 09/11/2019  12:46 PM  Please check [email or web address] for unit-specific pharmacy phone numbers.

## 2019-09-11 NOTE — Progress Notes (Signed)
RT note: patient placed on CPAP/PSV 12/5 at 0830.  Currently tolerating well.

## 2019-09-11 NOTE — Progress Notes (Signed)
3% changed from rt AC to [place].

## 2019-09-11 NOTE — Progress Notes (Signed)
PULMONARY / CRITICAL CARE MEDICINE   NAME:  Joshua [name], MRN:  [id number], DOB:  January 13, 1981, LOS: 2 ADMISSION DATE:  09/09/2019, CONSULTATION DATE: 09/09/2019 REFERRING MD: Neurosurgery, CHIEF COMPLAINT: Vent dependent respiratory failure secondary to intracranial hemorrhage  BRIEF HISTORY:    38 year old history of alcohol abuse seizures noncompliant hypertension who presented with new seizure right-sided hemiplegia confusion was found to have a large left intracranial hemorrhage for which he underwent craniectomy.  HISTORY OF PRESENT ILLNESS   38 year old male with a history of alcohol abuse to the point of alcohol poisoning resulting in a seizure.  He was in a drug house and was noted to have a seizure and woke up with right-sided hemiplegia.  He was noted to have vomited, urinary incontinence, bowel incontinence with confusion.  He was nonverbal and not following commands.  EMS was activated he is transported to [facility] evaluated by neurology and neurosurgery and was taken to the operating room for urgent left frontal temporal craniectomy evacuation hematoma with skull flap implantation. He was admitted to NSICU intubated.  SIGNIFICANT PAST MEDICAL HISTORY   Hypertension Alcohol abuse  SIGNIFICANT EVENTS:  09/08/2021 operating room for left frontal temporal craniectomy and evacuation hematoma STUDIES:   CT of head 09/09/2019 large left intracerebral hemorrhage CT of head 09/10/2019-decreased size of left basal ganglia hematoma with expected postoperative air.  Mild improvement of associated mass-effect.  Redistribution of intraventricular blood. CULTURES:  09/09/2019 sputum>> moderate white blood cells, no organisms. 9/13 blood >> MRSA swab neg COVID neg  ANTIBIOTICS:  None  LINES/TUBES:  09/09/2019 endotracheal tube 09/09/2019 orogastric tube 09/09/2019 right radial A-line  CONSULTANTS:  09/09/2019 pulmonary critical care 09/09/2019 neurology SUBJECTIVE:  Febrile  overnight and this morning. Exam better with movement of all but RUE, but mental status remains poor- not opening his eyes.  CONSTITUTIONAL: BP (!) 141/87   Pulse (!) 123   Temp (!) 102.4 F (39.1 C) (Axillary)   Resp 19   Ht 5\' 9"  (1.753 m)   Wt 64.8 kg   SpO2 99%   BMI 21.10 kg/m   I/O last 3 completed shifts: In: 5780.2 [I.V.:3760.2; NG/GT:1920; IV Piggyback:100] Out: 1780 [Urine:1780]     Vent Mode: PRVC FiO2 (%):  [30 %-40 %] 30 % Set Rate:  [18 bmp] 18 bmp Vt Set:  [560 mL] 560 mL PEEP:  [5 cmH20] 5 cmH20 Plateau Pressure:  [12 [id number] cmH20] 18 cmH20  PHYSICAL EXAM: General: critically-ill, laying in bed in NAD. Spontaneously moving legs. Neuro: No response to verbal stimulation, not opening eyes. Pupils small, but reactive to light. Persistent rightward gaze. Normal occulocephalics.  HEENT: Head bandaged, no bleeding evident. Cardiovascular: tachycardic, regular rhythm Lungs: Thick, white secretions from ETT, CTAB Abdomen: soft, NT, ND Musculoskeletal: no peripheral edema Skin: no cyanosis or wounds  RESOLVED PROBLEM LIST   ASSESSMENT AND PLAN   Critically ill due to ventilator dependent respiratory failure in the setting of large intracerebral hemorrhage left.  -con't vent support, LTVV, goal plateau <30 -con't to minimize sedation as able -daily SAT  &SBT when able  Noted to have vomited at scene- likely now aspiration pneumonia with fevers and thick secretions -starting VAP antibiotics- pip-tazo and vanc -reculture sputum  Large left intracerebral hemorrhage status post left craniectomy with evacuation of hematoma with skull flap implantation right abdomen. Some evidence of midline shift on repeat CT in spite of decompressive craniotomy Currently at peak expected swelling. -con't hypertonic saline per neuro with Q6h Na+ levels -manage HTN; goal SBP <  160- additional oral meds added  Blood pressure controlled status post intracerebral hemorrhage with  goal systolic blood pressure less than 140.  History of hypertension with poor compliance with medication. -con't Cleviprex drip; working on titrating this down and increasing oral meds -minimize sedation -pain control with tylenol  AKI vs CKD. Renal function worsening- possible related to sepsis. -agree with switching off ACE -avoid nephrotoxic meds as much as possible -renally dose medications  Polysubstance abuse -TF + enteral vitamins  Hyperglycemia- doing well on SSI -con't accuchecks Q4h + SSI PRN -goal BG 140-180 while admitted   Best Practice / Goals of Care / Disposition.    DVT PROPHYLAXIS: SCDs, starting Temelec heparin today (48 hrs post-op) GI prophylaxis: PPI Pain agitation: PRN pain medication only NUTRITION: N.p.o. tube feedings MOBILITY: Bedrest GOALS OF CARE: Full code Foley catheter: transitioned to condom catheter FAMILY DISCUSSIONS: sister Wynelle Link updated over the phone, coming to bedside soon DISPOSITION neuro ICU  LABS  Glucose Recent Labs  Lab 09/10/19 1206 09/10/19 1528 09/10/19 1941 09/10/19 2337 09/11/19 0332 09/11/19 0747  GLUCAP 152* 123* 145* 140* 153* 131*    BMET Recent Labs  Lab 09/09/19 0852 09/10/19 0449  09/10/19 1511 09/10/19 2147 09/11/19 0445  NA 136 137   < > 142 140 146*  K 3.9 3.9  --   --   --  4.0  CL 104 109  --   --   --  121*  CO2 20* 20*  --   --   --  20*  BUN 21* 26*  --   --   --  27*  CREATININE 2.08* 2.17*  --   --   --  2.34*  GLUCOSE 99 133*  --   --   --  155*   < > = values in this interval not displayed.    Liver Enzymes Recent Labs  Lab 09/09/19 0226  AST 23  ALT 17  ALKPHOS 73  BILITOT 0.4  ALBUMIN 4.1    Electrolytes Recent Labs  Lab 09/09/19 0407 09/09/19 0852 09/10/19 0449 09/11/19 0445  CALCIUM  --  8.1* 8.7* 8.4*  MG 1.9  --  2.1  --   PHOS 2.2*  --  1.3*  --     CBC Recent Labs  Lab 09/09/19 0226  09/09/19 0412 09/10/19 0449 09/11/19 0445  WBC 11.1*  --   --  24.3*  18.8*  HGB 14.5   < > 14.6 10.7* 8.5*  HCT 43.6   < > 43.0 31.3* 26.0*  PLT 204  --   --  147* 124*   < > = values in this interval not displayed.    ABG Recent Labs  Lab 09/09/19 0412 09/09/19 0835  PHART 7.443 7.418  PCO2ART 44.4 31.1*  PO2ART 592.0* 202*    Coags Recent Labs  Lab 09/09/19 0226  APTT 29  INR 0.9    Sepsis Markers No results for input(s): LATICACIDVEN, PROCALCITON, O2SATVEN in the last 168 hours.  Cardiac Enzymes No results for input(s): TROPONINI, PROBNP in the last 168 hours.     This patient is critically ill with multiple organ system failure which requires frequent high complexity decision making, assessment, support, evaluation, and titration of therapies. This was completed through the application of advanced monitoring technologies and extensive interpretation of multiple databases. During this encounter critical care time was devoted to patient care services described in this note for 45 minutes.  Julian Hy, DO 09/11/19 11:21 AM [place]  [facility]

## 2019-09-11 NOTE — Progress Notes (Addendum)
Subjective: Patient reports (vent)  Objective: Vital signs in last 24 hours: Temp:  [98.1 F (36.7 C)-102.4 F (39.1 C)] 102.4 F (39.1 C) (09/14 0740) Pulse Rate:  [60-129] 107 (09/14 0751) Resp:  [17-32] 18 (09/14 0751) BP: (111-144)/(71-92) 141/87 (09/14 0751) SpO2:  [95 %-100 %] 99 % (09/14 0751) Arterial Line BP: (120-171)/(63-81) 149/81 (09/14 0715) FiO2 (%):  [30 %-40 %] 30 % (09/14 0751) Weight:  [64.8 kg] 64.8 kg (09/14 0500)  Intake/Output from previous day: 09/13 0701 - 09/14 0700 In: 3477.4 [I.V.:2277.4; NG/GT:1200] Out: 950 [Urine:950] Intake/Output this shift: No intake/output data recorded.  Resting quietly. Vent. Moves left side with purpose. Withdraws on right. Agitated with reduced sedation. Not following commands.  Lab Results: Recent Labs    09/10/19 0449 09/11/19 0445  WBC 24.3* 18.8*  HGB 10.7* 8.5*  HCT 31.3* 26.0*  PLT 147* 124*   BMET Recent Labs    09/10/19 0449  09/10/19 2147 09/11/19 0445  NA 137   < > 140 146*  K 3.9  --   --  4.0  CL 109  --   --  121*  CO2 20*  --   --  20*  GLUCOSE 133*  --   --  155*  BUN 26*  --   --  27*  CREATININE 2.17*  --   --  2.34*  CALCIUM 8.7*  --   --  8.4*   < > = values in this interval not displayed.    Studies/Results: Ct Angio Head W Or Wo Contrast  Result Date: 09/10/2019 CLINICAL DATA:  Intracranial hemorrhage follow up EXAM: CT HEAD WITHOUT CONTRAST CT ANGIOGRAPHY OF THE HEAD TECHNIQUE: Contiguous axial images were obtained from the base of the skull through the vertex without intravenous contrast. Multidetector CT imaging of the head was performed using the standard protocol during bolus administration of intravenous contrast. Multiplanar CT image reconstructions and MIPs were obtained to evaluate the vascular anatomy. CONTRAST:  44mL OMNIPAQUE IOHEXOL 350 MG/ML SOLN COMPARISON:  Head CT 09/09/2019 FINDINGS: CT HEAD FINDINGS Brain: Status post hematoma evacuation. Residual hematoma with  interspersed postoperative gas measures 6.1 x 2.9 cm, centered in the left basal ganglia. Entrapment of the right lateral ventricle temporal horn has resolved. Volume a blood within the lateral ventricles is unchanged. Punctate focus of hemorrhage at the posterior right insula is unchanged. There is now extra-axial blood over the left convexity underlying the craniectomy site. Midline shift has improved, now measuring 6 mm to the right. Skull: Left pterional craniectomy Sinuses/Orbits: Negative CTA HEAD FINDINGS POSTERIOR CIRCULATION: --Vertebral arteries: Normal V4 segments. --Posterior inferior cerebellar arteries (PICA): Patent origins from the vertebral arteries. --Anterior inferior cerebellar arteries (AICA): Patent origins from the basilar artery. --Basilar artery: Normal. --Superior cerebellar arteries: Normal. --Posterior cerebral arteries: Normal. Both originate from the basilar artery. Posterior communicating arteries (p-comm) are diminutive or absent. ANTERIOR CIRCULATION: --Intracranial internal carotid arteries: Normal. --Anterior cerebral arteries (ACA): Normal. Both A1 segments are present. Patent anterior communicating artery (a-comm). --Middle cerebral arteries (MCA): Normal. VENOUS SINUSES: As permitted by contrast timing, patent. ANATOMIC VARIANTS: None Review of the MIP images confirms the above findings. IMPRESSION: 1. Status post left pterional craniectomy for evacuation of left basal ganglia hematoma. Residual hematoma with interspersed postoperative gas measures 6.1 x 2.9 cm. 2. Decreased midline shift, now measuring 6 mm to the right. 3. Unchanged punctate focus of hemorrhage at the posterior right insula. 4. Unchanged intraventricular blood. 5. No intracranial arterial occlusion or high-grade stenosis. No  aneurysm or vascular malformation. Electronically Signed   By: Ulyses Jarred M.D.   On: 09/10/2019 05:58   Ct Head Wo Contrast  Result Date: 09/10/2019 CLINICAL DATA:  Intracranial  hemorrhage follow up EXAM: CT HEAD WITHOUT CONTRAST CT ANGIOGRAPHY OF THE HEAD TECHNIQUE: Contiguous axial images were obtained from the base of the skull through the vertex without intravenous contrast. Multidetector CT imaging of the head was performed using the standard protocol during bolus administration of intravenous contrast. Multiplanar CT image reconstructions and MIPs were obtained to evaluate the vascular anatomy. CONTRAST:  55mL OMNIPAQUE IOHEXOL 350 MG/ML SOLN COMPARISON:  Head CT 09/09/2019 FINDINGS: CT HEAD FINDINGS Brain: Status post hematoma evacuation. Residual hematoma with interspersed postoperative gas measures 6.1 x 2.9 cm, centered in the left basal ganglia. Entrapment of the right lateral ventricle temporal horn has resolved. Volume a blood within the lateral ventricles is unchanged. Punctate focus of hemorrhage at the posterior right insula is unchanged. There is now extra-axial blood over the left convexity underlying the craniectomy site. Midline shift has improved, now measuring 6 mm to the right. Skull: Left pterional craniectomy Sinuses/Orbits: Negative CTA HEAD FINDINGS POSTERIOR CIRCULATION: --Vertebral arteries: Normal V4 segments. --Posterior inferior cerebellar arteries (PICA): Patent origins from the vertebral arteries. --Anterior inferior cerebellar arteries (AICA): Patent origins from the basilar artery. --Basilar artery: Normal. --Superior cerebellar arteries: Normal. --Posterior cerebral arteries: Normal. Both originate from the basilar artery. Posterior communicating arteries (p-comm) are diminutive or absent. ANTERIOR CIRCULATION: --Intracranial internal carotid arteries: Normal. --Anterior cerebral arteries (ACA): Normal. Both A1 segments are present. Patent anterior communicating artery (a-comm). --Middle cerebral arteries (MCA): Normal. VENOUS SINUSES: As permitted by contrast timing, patent. ANATOMIC VARIANTS: None Review of the MIP images confirms the above findings.  IMPRESSION: 1. Status post left pterional craniectomy for evacuation of left basal ganglia hematoma. Residual hematoma with interspersed postoperative gas measures 6.1 x 2.9 cm. 2. Decreased midline shift, now measuring 6 mm to the right. 3. Unchanged punctate focus of hemorrhage at the posterior right insula. 4. Unchanged intraventricular blood. 5. No intracranial arterial occlusion or high-grade stenosis. No aneurysm or vascular malformation. Electronically Signed   By: Ulyses Jarred M.D.   On: 09/10/2019 05:58   Dg Chest Port 1 View  Result Date: 09/10/2019 CLINICAL DATA:  Endotracheal tube placement. EXAM: PORTABLE CHEST 1 VIEW COMPARISON:  09/09/2019 FINDINGS: Endotracheal tube has tip 4.1 cm above the carina. Enteric tube courses into the stomach and off the film as tip is not visualized. Lungs are adequately inflated with mild stable elevation of the right hemidiaphragm. No focal lobar consolidation or effusion. Previous seen hazy density over the right midlung is improved to nearly resolved likely resolving atelectasis. Cardiomediastinal silhouette and remainder of the exam is unchanged. IMPRESSION: Resolving right midlung atelectasis. Tubes and lines as described. Electronically Signed   By: Marin Olp M.D.   On: 09/10/2019 08:41   Dg Chest Port 1 View  Result Date: 09/09/2019 CLINICAL DATA:  Respiratory failure. EXAM: PORTABLE CHEST 1 VIEW COMPARISON:  09/09/2019 FINDINGS: Endotracheal tube tip 5 cm above the carina. Enteric tube courses into the stomach and off the film as tip is not visualized. Lungs are adequately inflated with mild elevation of the right hemidiaphragm. There is new hazy airspace opacification over the right mid to lower lung likely atelectasis. Left lung is clear. Cardiomediastinal silhouette and remainder of the exam is unchanged. IMPRESSION: New hazy opacification over the right midlung likely atelectasis. Tubes and lines as described. Electronically Signed  By: Marin Olp M.D.   On: 09/09/2019 11:22    Assessment/Plan:   LOS: 2 days  Continue supportive care   [name] 09/11/2019, 8:30 AM   Patient is more spontaneous and purposeful with left side.  Not following commands (probable aphasia).  Right gaze deviation, pupils equal and reactive.  Continue hypertonic saline.  Dressings CDI over head and abdomen.

## 2019-09-11 NOTE — Progress Notes (Signed)
[facility] Progress Note [name] DOB: Jul 26, 1981 MRN: [id number]   Date of Service  09/11/2019  HPI/Events of Note  Temp = 104 axillary - No improvement with Tylenol. Request for cooling blanket.   eICU Interventions  Will order cooling blanket PRN     Intervention Category Major Interventions: Other:  Lysle Dingwall 09/11/2019, 8:11 PM

## 2019-09-11 NOTE — Progress Notes (Signed)
Temp 102.4. Blanket removed, cool bath, tylenol given.

## 2019-09-11 NOTE — Progress Notes (Signed)
STROKE TEAM PROGRESS NOTE   INTERVAL HISTORY  Patient remains intubated and on ventilatory support for respiratory failure.  Neurologically he remains sleepy and can barely arouse but does have semipurposeful left-sided movement and slight withdrawal in the right leg.  He has spiked a temperature of 102.4 today.  OBJECTIVE Vitals:   09/11/19 0715 09/11/19 0740 09/11/19 0751 09/11/19 0800  BP: 139/88  (!) 141/87 (!) 141/87  Pulse: (!) 107  (!) 107 (!) 123  Resp: 18  18 19   Temp:  (!) 102.4 F (39.1 C)    TempSrc:  Axillary    SpO2: 100%  99% 99%  Weight:      Height:        CBC:  Recent Labs  Lab 09/10/19 0449 09/11/19 0445  WBC 24.3* 18.8*  NEUTROABS 21.3* 16.4*  HGB 10.7* 8.5*  HCT 31.3* 26.0*  MCV 88.2 91.2  PLT 147* 124*    Basic Metabolic Panel:  Recent Labs  Lab 09/09/19 0407  09/10/19 0449  09/11/19 0445 09/11/19 0843  NA  --    < > 137   < > 146* 150*  K  --    < > 3.9  --  4.0  --   CL  --    < > 109  --  121*  --   CO2  --    < > 20*  --  20*  --   GLUCOSE  --    < > 133*  --  155*  --   BUN  --    < > 26*  --  27*  --   CREATININE  --    < > 2.17*  --  2.34*  --   CALCIUM  --    < > 8.7*  --  8.4*  --   MG 1.9  --  2.1  --   --   --   PHOS 2.2*  --  1.3*  --   --   --    < > = values in this interval not displayed.    Lipid Panel:     Component Value Date/Time   TRIG 115 09/10/2019 0449   HgbA1c: No results found for: HGBA1C Urine Drug Screen:     Component Value Date/Time   LABOPIA NONE DETECTED 09/09/2019 0349   COCAINSCRNUR NONE DETECTED 09/09/2019 0349   LABBENZ NONE DETECTED 09/09/2019 0349   AMPHETMU NONE DETECTED 09/09/2019 0349   THCU NONE DETECTED 09/09/2019 0349   LABBARB NONE DETECTED 09/09/2019 0349    Alcohol Level     Component Value Date/Time   ETH <10 09/09/2019 0407    IMAGING  Ct Head Code Stroke Wo Contrast 09/09/2019 1. Large intraparenchymal hematoma of the left basal ganglia measuring 7.8 x 4.8 cm with 14 mm  of rightward midline shift and early subfalcine herniation.  2. Intraventricular extension of hemorrhage. Small focus of intraparenchymal hemorrhage in the right basal ganglia.   CT head  CTA Head  09/10/2019 1. Status post left pterional craniectomy for evacuation of left basal ganglia hematoma. Residual hematoma with interspersed postoperative gas measures 6.1 x 2.9 cm. 2. Decreased midline shift, now measuring 6 mm to the right. 3. Unchanged punctate focus of hemorrhage at the posterior right insula. 4. Unchanged intraventricular blood. 5. No intracranial arterial occlusion or high-grade stenosis. No aneurysm or vascular malformation.  Dg Chest Port 1 View 09/10/2019 Resolving right midlung atelectasis. Tubes and lines as described. 09/09/2019 No acute cardiopulmonary abnormality. Appropriate  positioning of the endotracheal and transesophageal tubes.   ECG - SR rate 97 BPM. LVH and prolonged QT interval. (See cardiology reading for complete details)   PHYSICAL EXAM   [name] who is sedated intubated.  Not in distress. . Afebrile. Head is nontraumatic. Neck is supple without bruit.    Cardiac exam no murmur or gallop. Lungs are clear to auscultation. Distal pulses are well felt.  Neurological Exam  Patient is sedated intubated   Eyes are closed.  He does not follow any commands.  Pupils 2 mm sluggishly reactive.  Left gaze deviation doll's eye movements are absent.  Corneal reflexes are sluggish.  Fundi not visualized.  Face is symmetric.  Tongue midline.  Intermittent left upper and lower extremity spontaneous motor movements.  Semipurposeful localization on the left.  Trace flexion in the right lower extremity to pain.  No movement in the right upper extremity.  Plantars are downgoing.   ASSESSMENT/PLAN [name] is a 38 y.o. male with history of admission for Etoh poisoning presenting with a seizure, vomiting.   ICH:  Large L basal ganglia ICH with  rightward shift and early subfalcine herniation, IVH s/p crani w/ abd, Small focus of R basal ganglia ICH.   NSG - Vertell Limber - decompressive craniectomy w/ flap in R abd - 09/09/19 Dr Vertell Limber  Code Stroke CT Head - Large intraparenchymal hematoma of the left basal ganglia measuring 7.8 x 4.8 cm with 14 mm of rightward midline shift and early subfalcine herniation. Intraventricular extension of hemorrhage. Small focus of intraparenchymal hemorrhage in the right basal ganglia.   CT/CTA head - 09/10/2019 - Status post left pterional craniectomy for evacuation of left basal ganglia hematoma. Residual hematoma with interspersed postoperative gas measures 6.1 x 2.9 cm. Decreased midline shift, now measuring 6 mm to the right. No intracranial arterial occlusion or high-grade stenosis. No aneurysm or vascular malformation  Lacey Jensen Virus 2 - negative  UDS - negative  VTE prophylaxis - SCDs  No antithrombotic prior to admission, now on No antithrombotic d/t hemorrhage   Therapy recommendations:  pending  Disposition:  Pending  Respiratory Failure  Secondary to [place]  Intubated  Sedated   CCM on board  Cerebral Edema  On 3% saline protocol  3% at 75cc/hr  Place PICC   Na 150  Goal Na 150-155  Seizure  On presentation  Hx 1 seizure previously  on Keppra 500 mg IV Bid  Hypertensive Emergency  Probable undiagnosed / untreated hypertension (severe LVH on ECG)  Home BP meds: none  Current BP meds: Cleviprex, amlodipine 10, labetolol 100 tid  D/t increasing Cr, d/c lisinopril (added labetalol)   SBP goal < 160 mm Hg  Hyperlipidemia  Home Lipid lowering medication: none  LDL - 115  Current lipid lowering medication: None  Consider statin at discharge  Dysphagia Malnutrition . Secondary to stroke . NPO  Tube Feedings started 09/09/19  Place cortrak  Leukocytosis Febrile  WBC 11.1->24.3->18.8  T Max 102.4  CXR - resolving atx  U/A and blood cultures (mod  WBC, no orgs, reincubated) - pending  On Vanc & Zosyn  Other Stroke Risk Factors  Hx ETOH abuse, recent hospitalization for alcohol poisoning.   Polysubstance abuse  Other Active Problems  Hypokalemia- 2.1 -> supplemented->4.0  AKI, ? CKD. Creatinine - 2.29->2.10->2.17->2.34  Mild Thrombocytopenia - 204->147->124  Phos - 2.2-1.3  Anemia Hb 8.5  Hospital day # 2  Continue ventilatory support for respiratory failure and hypertonic saline with  serum sodium goal of 150-155.  May need PICC line placement for long-term IV usage.  Discontinue lisinopril no thrill because of worsening renal failure and add labetalol instead.  Wean Cardene drip.  Discussed with Dr. Lanna Poche critical care medicine. This patient is critically ill and at significant risk of neurological worsening, death and care requires constant monitoring of vital signs, hemodynamics,respiratory and cardiac monitoring, extensive review of multiple databases, frequent neurological assessment, discussion with family, other specialists and medical decision making of high complexity.I have made any additions or clarifications directly to the above note.This critical care time does not reflect procedure time, or teaching time or supervisory time of PA/NP/Med Resident etc but could involve care discussion time.  I spent 30 minutes of neurocritical care time  in the care of  this patient.     Antony Contras, MD Medical Director [facility] Pager: [phone] 09/11/2019 9:40 AM    To contact Stroke Continuity provider, please refer to [email or web address] After hours, contact General Neurology

## 2019-09-11 NOTE — Progress Notes (Signed)
Peripherally Inserted Central Catheter/Midline Placement  The IV Nurse has discussed with the patient and/or persons authorized to consent for the patient, the purpose of this procedure and the potential benefits and risks involved with this procedure.  The benefits include less needle sticks, lab draws from the catheter, and the patient may be discharged home with the catheter. Risks include, but not limited to, infection, bleeding, blood clot (thrombus formation), and puncture of an artery; nerve damage and irregular heartbeat and possibility to perform a PICC exchange if needed/ordered by physician.  Alternatives to this procedure were also discussed.  Bard Power PICC patient education guide, fact sheet on infection prevention and patient information card has been provided to patient /or left at bedside.    PICC/Midline Placement Documentation  PICC Double Lumen 09/11/19 PICC Right Brachial 45 cm 0 cm (Active)  Indication for Insertion or Continuance of Line Administration of hyperosmolar/irritating solutions (i.e. TPN, Vancomycin, etc.) 09/11/19 1241  Exposed Catheter (cm) 0 cm 09/11/19 1241  Site Assessment Clean;Dry;Intact 09/11/19 1241  Lumen #1 Status Flushed;Blood return noted 09/11/19 1241  Lumen #2 Status Flushed;Blood return noted 09/11/19 1241  Dressing Type Transparent 09/11/19 1241  Dressing Status Clean;Dry;Antimicrobial disc in place;Intact;Other (Comment) 09/11/19 1241  Dressing Intervention New dressing 09/11/19 [id number]  Dressing Change Due 09/18/19 09/11/19 1241   Telephone consent signed by mother   Synthia Innocent 09/11/2019, 12:41 PM

## 2019-09-11 NOTE — Progress Notes (Signed)
Notified Neurology MD about patient's sodium climbing to 155. Verbal order to stop 3% at this time and continue checking q6H sodiums.

## 2019-09-12 DIAGNOSIS — J9601 Acute respiratory failure with hypoxia: Secondary | ICD-10-CM

## 2019-09-12 LAB — CULTURE, RESPIRATORY W GRAM STAIN: Culture: NORMAL

## 2019-09-12 LAB — BASIC METABOLIC PANEL
Anion gap: 9 (ref 5–15)
BUN: 23 mg/dL — ABNORMAL HIGH (ref 6–20)
CO2: 24 mmol/L (ref 22–32)
Calcium: 8.8 mg/dL — ABNORMAL LOW (ref 8.9–10.3)
Chloride: 123 mmol/L — ABNORMAL HIGH (ref 98–111)
Creatinine, Ser: 2.42 mg/dL — ABNORMAL HIGH (ref 0.61–1.24)
GFR calc Af Amer: 38 mL/min — ABNORMAL LOW (ref >60)
GFR calc non Af Amer: 32 mL/min — ABNORMAL LOW (ref >60)
Glucose, Bld: 122 mg/dL — ABNORMAL HIGH (ref 70–99)
Potassium: 3.6 mmol/L (ref 3.5–5.1)
Sodium: 156 mmol/L — ABNORMAL HIGH (ref 135–145)

## 2019-09-12 LAB — GLUCOSE, CAPILLARY
Glucose-Capillary: 119 mg/dL — ABNORMAL HIGH (ref 70–99)
Glucose-Capillary: 124 mg/dL — ABNORMAL HIGH (ref 70–99)
Glucose-Capillary: 124 mg/dL — ABNORMAL HIGH (ref 70–99)
Glucose-Capillary: 124 mg/dL — ABNORMAL HIGH (ref 70–99)
Glucose-Capillary: 132 mg/dL — ABNORMAL HIGH (ref 70–99)
Glucose-Capillary: 133 mg/dL — ABNORMAL HIGH (ref 70–99)

## 2019-09-12 LAB — SODIUM
Sodium: 155 mmol/L — ABNORMAL HIGH (ref 135–145)
Sodium: 153 mmol/L — ABNORMAL HIGH (ref 135–145)
Sodium: 152 mmol/L — ABNORMAL HIGH (ref 135–145)

## 2019-09-12 MED ORDER — LABETALOL HCL 100 MG PO TABS
200.0000 mg | ORAL_TABLET | Freq: Three times a day (TID) | ORAL | Status: DC
  Filled 2019-09-12: qty 2

## 2019-09-12 MED ORDER — LABETALOL HCL 200 MG PO TABS
200.0000 mg | ORAL_TABLET | Freq: Three times a day (TID) | ORAL | Status: DC
  Administered 2019-09-12 – 2019-09-30 (×56): 200 mg
  Filled 2019-09-12 (×2): qty 2
  Filled 2019-09-12: qty 1
  Filled 2019-09-12 (×2): qty 2
  Filled 2019-09-12: qty 1
  Filled 2019-09-12 (×2): qty 2
  Filled 2019-09-12: qty 1
  Filled 2019-09-12 (×11): qty 2
  Filled 2019-09-12: qty 1
  Filled 2019-09-12 (×2): qty 2
  Filled 2019-09-12: qty 1
  Filled 2019-09-12 (×3): qty 2
  Filled 2019-09-12 (×2): qty 1
  Filled 2019-09-12 (×19): qty 2
  Filled 2019-09-12: qty 1
  Filled 2019-09-12 (×4): qty 2
  Filled 2019-09-12: qty 1
  Filled 2019-09-12 (×2): qty 2

## 2019-09-12 MED ORDER — ACETAMINOPHEN 650 MG RE SUPP
650.0000 mg | RECTAL | Status: DC | PRN
  Filled 2019-09-12: qty 1

## 2019-09-12 MED ORDER — LORAZEPAM 2 MG/ML IJ SOLN
1.0000 mg | INTRAMUSCULAR | Status: DC | PRN
  Administered 2019-09-12 – 2019-09-14 (×4): 2 mg via INTRAVENOUS
  Filled 2019-09-12 (×4): qty 1

## 2019-09-12 MED ORDER — HYDRALAZINE HCL 20 MG/ML IJ SOLN
20.0000 mg | Freq: Four times a day (QID) | INTRAMUSCULAR | Status: DC | PRN
  Administered 2019-09-15: 10:00:00 20 mg via INTRAVENOUS
  Filled 2019-09-12: qty 1

## 2019-09-12 MED ORDER — ACETAMINOPHEN 325 MG PO TABS
650.0000 mg | ORAL_TABLET | ORAL | Status: DC | PRN
  Administered 2019-09-12 – 2020-03-27 (×34): 650 mg
  Filled 2019-09-12 (×35): qty 2

## 2019-09-12 MED ORDER — HYDRALAZINE HCL 50 MG PO TABS
50.0000 mg | ORAL_TABLET | Freq: Three times a day (TID) | ORAL | Status: DC
  Administered 2019-09-12 – 2019-09-30 (×53): 50 mg
  Filled 2019-09-12 (×55): qty 1

## 2019-09-12 MED ORDER — DEXMEDETOMIDINE HCL IN NACL 200 MCG/50ML IV SOLN
0.2000 ug/kg/h | INTRAVENOUS | Status: DC
  Administered 2019-09-12: 0.7 ug/kg/h via INTRAVENOUS
  Administered 2019-09-12: 17:00:00 0.4 ug/kg/h via INTRAVENOUS
  Administered 2019-09-12 – 2019-09-13 (×3): 0.7 ug/kg/h via INTRAVENOUS
  Administered 2019-09-13: 0.5 ug/kg/h via INTRAVENOUS
  Administered 2019-09-13: 09:00:00 0.4 ug/kg/h via INTRAVENOUS
  Administered 2019-09-14 – 2019-09-16 (×10): 0.7 ug/kg/h via INTRAVENOUS
  Filled 2019-09-12 (×8): qty 50
  Filled 2019-09-12: qty 100
  Filled 2019-09-12: qty 50
  Filled 2019-09-12: qty 100
  Filled 2019-09-12 (×3): qty 50

## 2019-09-12 NOTE — Progress Notes (Signed)
STROKE TEAM PROGRESS NOTE   INTERVAL HISTORY Patient remains aphasic and not following commands purposeful left-sided movements and right hemiparesis.  Blood pressure adequately controlled.  Remains on ventilatory support for respiratory failure. Serum sodium is at goal at 155 and hypertonic saline has been stopped. OBJECTIVE Vitals:   09/12/19 0645 09/12/19 0700 09/12/19 0715 09/12/19 0756  BP: 139/88 (!) 140/94 (!) 157/105   Pulse: (!) 104 95 (!) 111 (!) 102  Resp: 18 18 18 18   Temp:      TempSrc:      SpO2: 99% 99% 100% 99%  Weight:      Height:        CBC:  Recent Labs  Lab 09/10/19 0449 09/11/19 0445  WBC 24.3* 18.8*  NEUTROABS 21.3* 16.4*  HGB 10.7* 8.5*  HCT 31.3* 26.0*  MCV 88.2 91.2  PLT 147* 124*    Basic Metabolic Panel:  Recent Labs  Lab 09/09/19 0407  09/10/19 0449  09/11/19 0445  09/11/19 2100 09/12/19 0414  NA  --    < > 137   < > 146*   < > 155* 155*  K  --    < > 3.9  --  4.0  --   --   --   CL  --    < > 109  --  121*  --   --   --   CO2  --    < > 20*  --  20*  --   --   --   GLUCOSE  --    < > 133*  --  155*  --   --   --   BUN  --    < > 26*  --  27*  --   --   --   CREATININE  --    < > 2.17*  --  2.34*  --   --   --   CALCIUM  --    < > 8.7*  --  8.4*  --   --   --   MG 1.9  --  2.1  --   --   --   --   --   PHOS 2.2*  --  1.3*  --   --   --   --   --    < > = values in this interval not displayed.    Lipid Panel:     Component Value Date/Time   TRIG 115 09/10/2019 0449   HgbA1c:  Lab Results  Component Value Date   HGBA1C 5.6 09/10/2019   Urine Drug Screen:     Component Value Date/Time   LABOPIA NONE DETECTED 09/09/2019 0349   COCAINSCRNUR NONE DETECTED 09/09/2019 0349   LABBENZ NONE DETECTED 09/09/2019 0349   AMPHETMU NONE DETECTED 09/09/2019 0349   THCU NONE DETECTED 09/09/2019 0349   LABBARB NONE DETECTED 09/09/2019 0349    Alcohol Level     Component Value Date/Time   ETH <10 09/09/2019 0407    IMAGING Ct Head  Code Stroke Wo Contrast 09/09/2019 1. 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  2. Intraventricular extension of hemorrhage. Small focus of intraparenchymal hemorrhage in the right basal ganglia.   CT head  CTA Head  09/10/2019 1. Status post left pterional craniectomy for evacuation of left basal ganglia hematoma. Residual hematoma with interspersed postoperative gas measures 6.1 x 2.9  cm. 2. Decreased midline shift, now measuring 6 mm to the right. 3. Unchanged punctate focus of hemorrhage at the posterior right insula. 4. Unchanged intraventricular blood. 5. No intracranial arterial occlusion or high-grade stenosis. No aneurysm or vascular malformation.   Dg Chest Port 1 View 09/10/2019 Resolving right midlung atelectasis. Tubes and lines as described. 09/09/2019 No acute cardiopulmonary abnormality. Appropriate positioning of the endotracheal and transesophageal tubes.   ECG - SR rate 97 BPM. LVH and prolonged QT interval. (See cardiology reading for complete details)   PHYSICAL EXAM     [name] who is sedated intubated.  Not in distress. . Afebrile. Head is nontraumatic. Neck is supple without bruit.    Cardiac exam no murmur or gallop. Lungs are clear to auscultation. Distal pulses are well felt.  Neurological Exam  Patient is sedated intubated   Eyes are closed.  He does not follow any commands.  Pupils 2 mm sluggishly reactive.  Left gaze deviation doll's eye movements are absent.  Corneal reflexes are sluggish.  Fundi not visualized.  Face is symmetric.  Tongue midline.  Intermittent left upper and lower extremity spontaneous motor movements.  Semipurposeful localization on the left.  Trace flexion in the right lower extremity to pain.  No movement in the right upper extremity.  Plantars are downgoing.   ASSESSMENT/PLAN [name] is a 38 y.o.  male with history of admission for Etoh poisoning presenting with a seizure, vomiting.   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   NSG - Vertell Limber - decompressive craniectomy w/ flap in R abd - 09/09/19 Dr Vertell Limber  Code Stroke CT Head - Large intraparenchymal hematoma of the left basal ganglia measuring 7.8 x 4.8 cm with 14 mm of rightward midline shift and early subfalcine herniation. Intraventricular extension of hemorrhage. Small focus of intraparenchymal hemorrhage in the right basal ganglia.   CT/CTA head - 09/10/2019 - Status post left pterional craniectomy for evacuation of left basal ganglia hematoma. Residual hematoma with interspersed postoperative gas measures 6.1 x 2.9 cm. Decreased midline shift, now measuring 6 mm to the right. No intracranial arterial occlusion or high-grade stenosis. No aneurysm or vascular malformation  Ct head in am  HgbA1c 5.6  [employer] Virus 2 - negative  UDS - negative  VTE prophylaxis - Heparin 5000 units sq tid   No antithrombotic prior to admission, now on No antithrombotic d/t hemorrhage   Therapy recommendations:  pending  Disposition:  Pending  Cerebral Edema  On 3% saline protocol  Has PICC    Na 155   Drip off w/ elevated Na  Goal Na 150-155  Na checks q 6 h  Respiratory Failure  Secondary to [place]  Intubated  Sedated   CCM on board  VAP vs Aspiration PNA (vomited PTA) Leukocytosis, Febrile  WBC 11.1->24.3->18.8    T Max 103   CXR - resolving atx  U/A and blood cultures (mod WBC, no orgs, reincubated) - pending  On Vanc & Zosyn  Hypertensive Emergency  Probable undiagnosed / untreated hypertension (severe LVH on ECG)  Home BP meds: none  Current BP meds: Cleviprex, amlodipine 10, labetolol 100 tid    d/c lisinopril D/t increasing Cr  Added hydralazine and labetalol prn  SBP goal < 160 mm Hg  Wean  cleviprex  Seizure  On presentation  Hx 1 seizure previously  on Keppra 500 mg  IV Bid  Hyperlipidemia  Home Lipid lowering medication: none  LDL - 115  Current lipid lowering medication: None  Consider statin at discharge  Dysphagia Malnutrition . Secondary to stroke . NPO  Tube Feedings started 09/09/19  Place cortrak  Other Stroke Risk Factors  Hx ETOH abuse, recent hospitalization for alcohol poisoning.   Polysubstance abuse  Other Active Problems  Hypokalemia- 2.1 -> supplemented->4.0  AKI, ? CKD. Creatinine - 2.29->2.10->2.17->2.34  Mild Thrombocytopenia - 204->147->124  Phos - 2.2-1.3  Anemia Hb 8.5  Hospital day # 3  Recommend continue to hold hypertonic saline and follow serum sodium.  Repeat CT scan of the head tomorrow morning if right-to-left shift is reduced we will discontinue the drip.  Left serum sodium come down gradually by itself.  Continue maintain strict blood pressure control and increase labetalol dose and may add hydralazine as well.  Will place patient on alcohol withdrawal precautions.  Discussed with Dr. Noemi Chapel critical care medicine. This patient is critically ill and at significant risk of neurological worsening, death and care requires constant monitoring of vital signs, hemodynamics,respiratory and cardiac monitoring, extensive review of multiple databases, frequent neurological assessment, discussion with family, other specialists and medical decision making of high complexity.I have made any additions or clarifications directly to the above note.This critical care time does not reflect procedure time, or teaching time or supervisory time of PA/NP/Med Resident etc but could involve care discussion time.  I spent 30 minutes of neurocritical care time  in the care of  this patient.     Antony Contras, MD Medical Director [facility] Pager: [phone] 09/12/2019 9:06 AM  To contact Stroke Continuity provider,  please refer to [email or web address] After hours, contact General Neurology

## 2019-09-12 NOTE — Progress Notes (Signed)
PULMONARY / CRITICAL CARE MEDICINE   NAME:  Joshua [name], MRN:  [id number], DOB:  04-26-1981, LOS: 3 ADMISSION DATE:  09/09/2019, CONSULTATION DATE: 09/09/2019 REFERRING MD: Neurosurgery, CHIEF COMPLAINT: Vent dependent respiratory failure secondary to intracranial hemorrhage  BRIEF HISTORY:    38 year old history of alcohol abuse seizures noncompliant hypertension who presented with new seizure right-sided hemiplegia confusion was found to have a large left intracranial hemorrhage for which he underwent craniectomy.  HISTORY OF PRESENT ILLNESS   38 year old male with a history of alcohol abuse to the point of alcohol poisoning resulting in a seizure.  He was in a drug house and was noted to have a seizure and woke up with right-sided hemiplegia.  He was noted to have vomited, urinary incontinence, bowel incontinence with confusion.  He was nonverbal and not following commands.  EMS was activated he is transported to [facility] evaluated by neurology and neurosurgery and was taken to the operating room for urgent left frontal temporal craniectomy evacuation hematoma with skull flap implantation. He was admitted to NSICU intubated.  SIGNIFICANT PAST MEDICAL HISTORY   Hypertension Alcohol abuse  SIGNIFICANT EVENTS:  09/08/2021 operating room for left frontal temporal craniectomy and evacuation hematoma STUDIES:   CT of head 09/09/2019 large left intracerebral hemorrhage CT of head 09/10/2019-decreased size of left basal ganglia hematoma with expected postoperative air.  Mild improvement of associated mass-effect.  Redistribution of intraventricular blood. CULTURES:  09/09/2019 sputum>> moderate white blood cells, no organisms. 9/13 blood >> never collected  MRSA swab neg COVID neg 9/14 sputum>> abundant GNRs, few GPCs  ANTIBIOTICS:  vanc 9/14>> Pip-tazo 9/14>>  LINES/TUBES:  09/09/2019 endotracheal tube 09/09/2019 orogastric tube 09/09/2019 right radial A-line  CONSULTANTS:   09/09/2019 pulmonary critical care 09/09/2019 neurology SUBJECTIVE:  Persistently febrile overnight. Continues to not follow commands.  CONSTITUTIONAL: BP (!) 157/105   Pulse (!) 102   Temp 98.3 F (36.8 C) (Axillary)   Resp 18   Ht 5\' 9"  (1.753 m)   Wt 64.4 kg   SpO2 99%   BMI 20.97 kg/m   I/O last 3 completed shifts: In: 4867.2 [I.V.:2931.6; NG/GT:1800; IV Piggyback:135.6] Out: 5450 [Urine:5450]     Vent Mode: PSV;CPAP FiO2 (%):  [30 %-40 %] 40 % Set Rate:  [18 bmp] 18 bmp Vt Set:  [560 mL] 560 mL PEEP:  [5 cmH20] 5 cmH20 Pressure Support:  [5 cmH20] 5 cmH20 Plateau Pressure:  [13 cmH20-16 cmH20] 14 cmH20  PHYSICAL EXAM: General: critically ill young man laying in bed in NAD Neuro: Asleep, arouses to physical stimulation, not verbal stimulation.  Not opening eyes spontaneously.  Midline gaze, PERRL, intact oculocephalics.  No corneal reflex, but grimaces.  Withdrawing on the left, not the right. HEENT: Head bandaged, no bleeding. Cardiovascular: Tachycardic, regular rhythm, no murmurs Lungs: Thick blood-streaked sputum, rales in the right base.  Breathing comfortably on SBT 5/5. Abdomen: Soft, nontender, nondistended Musculoskeletal: No edema, cyanosis Skin: No wounds or rashes  RESOLVED PROBLEM LIST   ASSESSMENT AND PLAN   Critically ill due to ventilator dependent respiratory failure in the setting of large intracerebral hemorrhage left.  -Continue vent support and daily weaning.  Low tidal volume ventilation with goal plateau pressure less than 30. -Continue to minimize sedation as much as possible -Continue sedation awakening trials and vent weaning.  Mental status precludes extubation.  VAP vs aspiration pneumonia-likely gram-negative rods.  Had vomited prior to admission. -Continue PIP Tazocin and vancomycin until cultures speciate -Continue to follow cultures  Large left intracerebral  hemorrhage status post left craniectomy with evacuation of hematoma  with skull flap implantation right abdomen. Some evidence of midline shift on repeat CT in spite of decompressive craniotomy Currently at peak expected swelling. - Stopping hypertonic saline as sodium is at goal 155, continue to monitor - Continue goal SBP less than 160.  Clevidipine increased rather than decreased over the last 24 hours. -Continue amlodipine -Increase labetalol to 200 mg 3 times daily -Consider adding a third agent to speed weaning of clevidipine  Hypertension, likely the cause of his intracerebral hemorrhage. Goal systolic blood pressure less than 160.  History of hypertension with poor compliance with medication. -Continue goal SBP less than 160.  Clevidipine increased rather than decreased over the last 24 hours. -Continue amlodipine. -Increase labetalol to 200 mg 3 times daily. -Consider adding a third agent to speed weaning of clevidipine.  AKI vs CKD. Renal function worsening- possibly related to sepsis. -agree with switching off ACE for now -Measure I/O's-maintain euvolemia -Renally dose medications  Polysubstance abuse -Continue tube feeds and enteral vitamins  Hyperglycemia- doing well on SSI -Continue every 4 hours Accu-Cheks plus as needed sliding scale insulin -goal BG 140-180 while admitted   Best Practice / Goals of Care / Disposition.    DVT PROPHYLAXIS: SCDs, [place] heparin  GI prophylaxis: PPI Pain agitation: PRN pain medication only NUTRITION: N.p.o. tube feedings MOBILITY: Bedrest GOALS OF CARE: Full code Foley catheter: transitioned to condom catheter FAMILY DISCUSSIONS: sister Wynelle Link, gf lives locally DISPOSITION neuro ICU  LABS  Glucose Recent Labs  Lab 09/11/19 1210 09/11/19 1549 09/11/19 1936 09/11/19 2328 09/12/19 0319 09/12/19 0806  GLUCAP 148* 122* 139* 134* 124* 133*    BMET Recent Labs  Lab 09/09/19 [id number] 09/10/19 0449  09/11/19 0445  09/11/19 1532 09/11/19 2100 09/12/19 0414  NA 136 137   < > 146*   < > 153* 155*  155*  K 3.9 3.9  --  4.0  --   --   --   --   CL 104 109  --  121*  --   --   --   --   CO2 20* 20*  --  20*  --   --   --   --   BUN 21* 26*  --  27*  --   --   --   --   CREATININE 2.08* 2.17*  --  2.34*  --   --   --   --   GLUCOSE 99 133*  --  155*  --   --   --   --    < > = values in this interval not displayed.    Liver Enzymes Recent Labs  Lab 09/09/19 0226  AST 23  ALT 17  ALKPHOS 73  BILITOT 0.4  ALBUMIN 4.1    Electrolytes Recent Labs  Lab 09/09/19 0407 09/09/19 0852 09/10/19 0449 09/11/19 0445  CALCIUM  --  8.1* 8.7* 8.4*  MG 1.9  --  2.1  --   PHOS 2.2*  --  1.3*  --     CBC Recent Labs  Lab 09/09/19 0226  09/09/19 0412 09/10/19 0449 09/11/19 0445  WBC 11.1*  --   --  24.3* 18.8*  HGB 14.5   < > 14.6 10.7* 8.5*  HCT 43.6   < > 43.0 31.3* 26.0*  PLT 204  --   --  147* 124*   < > = values in this interval not displayed.    ABG Recent  Labs  Lab 09/09/19 0412 09/09/19 0835  PHART 7.443 7.418  PCO2ART 44.4 31.1*  PO2ART 592.0* 202*    Coags Recent Labs  Lab 09/09/19 0226  APTT 29  INR 0.9    Sepsis Markers No results for input(s): LATICACIDVEN, PROCALCITON, O2SATVEN in the last 168 hours.  Cardiac Enzymes No results for input(s): TROPONINI, PROBNP in the last 168 hours.     This patient is critically ill with multiple organ system failure which requires frequent high complexity decision making, assessment, support, evaluation, and titration of therapies. This was completed through the application of advanced monitoring technologies and extensive interpretation of multiple databases. During this encounter critical care time was devoted to patient care services described in this note for 35 minutes.  Julian Hy, DO 09/12/19 8:59 AM [facility]

## 2019-09-12 NOTE — Progress Notes (Addendum)
Subjective: Patient reports (vent)  Objective: Vital signs in last 24 hours: Temp:  [98.3 F (36.8 C)-103 F (39.4 C)] 98.3 F (36.8 C) (09/15 0400) Pulse Rate:  [95-147] 111 (09/15 0715) Resp:  [14-38] 18 (09/15 0715) BP: (120-164)/(66-105) 157/105 (09/15 0715) SpO2:  [93 %-100 %] 100 % (09/15 0715) Arterial Line BP: (95-175)/(65-89) 161/85 (09/15 0715) FiO2 (%):  [30 %-40 %] 40 % (09/15 0414) Weight:  [64.4 kg] 64.4 kg (09/15 0500)  Intake/Output from previous day: 09/14 0701 - 09/15 0700 In: 2977.6 [I.V.:1642; NG/GT:1200; IV Piggyback:135.6] Out: 5050 [Urine:5050] Intake/Output this shift: No intake/output data recorded.  Vent. Neuro exam unchanged: purposeful movement left arm and leg, withdraws right side. Not following commands  Lab Results: Recent Labs    09/10/19 0449 09/11/19 0445  WBC 24.3* 18.8*  HGB 10.7* 8.5*  HCT 31.3* 26.0*  PLT 147* 124*   BMET Recent Labs    09/10/19 0449  09/11/19 0445  09/11/19 2100 09/12/19 0414  NA 137   < > 146*   < > 155* 155*  K 3.9  --  4.0  --   --   --   CL 109  --  121*  --   --   --   CO2 20*  --  20*  --   --   --   GLUCOSE 133*  --  155*  --   --   --   BUN 26*  --  27*  --   --   --   CREATININE 2.17*  --  2.34*  --   --   --   CALCIUM 8.7*  --  8.4*  --   --   --    < > = values in this interval not displayed.    Studies/Results: [employer]  Result Date: 09/11/2019 If [employer] not attached, placement could not be confirmed due to current cardiac rhythm.   Assessment/Plan:   LOS: 3 days  Continue supportive care   [name] 09/12/2019, 7:49 AM  Continue support.

## 2019-09-12 NOTE — Progress Notes (Signed)
Called to bedside for tremors.  Temp 100.4. Hospital Day 3. Neurological exam unchanged. Increasing antihypertensive medications over the last 24 hours for blood pressure control. Tachycardia.  Symptoms c/w ETOH w/d.  Girlfriend at bedside reports 1 pint [place] per day.  Start CIWA protocol.  On thiamine daily.

## 2019-09-13 ENCOUNTER — Inpatient Hospital Stay (HOSPITAL_COMMUNITY): Payer: Medicaid Other

## 2019-09-13 DIAGNOSIS — I619 Nontraumatic intracerebral hemorrhage, unspecified: Secondary | ICD-10-CM

## 2019-09-13 DIAGNOSIS — N179 Acute kidney failure, unspecified: Secondary | ICD-10-CM

## 2019-09-13 LAB — CULTURE, RESPIRATORY W GRAM STAIN: Culture: NORMAL

## 2019-09-13 LAB — BASIC METABOLIC PANEL
Anion gap: 11 (ref 5–15)
Anion gap: 8 (ref 5–15)
BUN: 27 mg/dL — ABNORMAL HIGH (ref 6–20)
BUN: 35 mg/dL — ABNORMAL HIGH (ref 6–20)
CO2: 23 mmol/L (ref 22–32)
CO2: 23 mmol/L (ref 22–32)
Calcium: 8.7 mg/dL — ABNORMAL LOW (ref 8.9–10.3)
Calcium: 8.6 mg/dL — ABNORMAL LOW (ref 8.9–10.3)
Chloride: 118 mmol/L — ABNORMAL HIGH (ref 98–111)
Chloride: 121 mmol/L — ABNORMAL HIGH (ref 98–111)
Creatinine, Ser: 2.82 mg/dL — ABNORMAL HIGH (ref 0.61–1.24)
Creatinine, Ser: 2.81 mg/dL — ABNORMAL HIGH (ref 0.61–1.24)
GFR calc Af Amer: 31 mL/min — ABNORMAL LOW (ref >60)
GFR calc Af Amer: 31 mL/min — ABNORMAL LOW (ref >60)
GFR calc non Af Amer: 27 mL/min — ABNORMAL LOW (ref >60)
GFR calc non Af Amer: 27 mL/min — ABNORMAL LOW (ref >60)
Glucose, Bld: 152 mg/dL — ABNORMAL HIGH (ref 70–99)
Glucose, Bld: 124 mg/dL — ABNORMAL HIGH (ref 70–99)
Potassium: 3.8 mmol/L (ref 3.5–5.1)
Potassium: 3.7 mmol/L (ref 3.5–5.1)
Sodium: 152 mmol/L — ABNORMAL HIGH (ref 135–145)
Sodium: 152 mmol/L — ABNORMAL HIGH (ref 135–145)

## 2019-09-13 LAB — CBC
HCT: 23 % — ABNORMAL LOW (ref 39.0–52.0)
HCT: 22.4 % — ABNORMAL LOW (ref 39.0–52.0)
Hemoglobin: 7.3 g/dL — ABNORMAL LOW (ref 13.0–17.0)
Hemoglobin: 7.3 g/dL — ABNORMAL LOW (ref 13.0–17.0)
MCH: 29.1 pg (ref 26.0–34.0)
MCH: 29.9 pg (ref 26.0–34.0)
MCHC: 31.7 g/dL (ref 30.0–36.0)
MCHC: 32.6 g/dL (ref 30.0–36.0)
MCV: 91.6 fL (ref 80.0–100.0)
MCV: 91.8 fL (ref 80.0–100.0)
Platelets: 140 10*3/uL — ABNORMAL LOW (ref 150–400)
Platelets: 144 10*3/uL — ABNORMAL LOW (ref 150–400)
RBC: 2.51 MIL/uL — ABNORMAL LOW (ref 4.22–5.81)
RBC: 2.44 MIL/uL — ABNORMAL LOW (ref 4.22–5.81)
RDW: 15.8 % — ABNORMAL HIGH (ref 11.5–15.5)
RDW: 15.6 % — ABNORMAL HIGH (ref 11.5–15.5)
WBC: 12.4 10*3/uL — ABNORMAL HIGH (ref 4.0–10.5)
WBC: 12.5 10*3/uL — ABNORMAL HIGH (ref 4.0–10.5)
nRBC: 0.6 % — ABNORMAL HIGH (ref 0.0–0.2)
nRBC: 0.6 % — ABNORMAL HIGH (ref 0.0–0.2)

## 2019-09-13 LAB — POCT I-STAT 7, (LYTES, BLD GAS, ICA,H+H)
Acid-Base Excess: 1 mmol/L (ref 0.0–2.0)
Bicarbonate: 24.6 mmol/L (ref 20.0–28.0)
Calcium, Ion: 1.27 mmol/L (ref 1.15–1.40)
HCT: 27 % — ABNORMAL LOW (ref 39.0–52.0)
Hemoglobin: 9.2 g/dL — ABNORMAL LOW (ref 13.0–17.0)
O2 Saturation: 96 %
Patient temperature: 37
Potassium: 3.7 mmol/L (ref 3.5–5.1)
Sodium: 153 mmol/L — ABNORMAL HIGH (ref 135–145)
TCO2: 26 mmol/L (ref 22–32)
pCO2 arterial: 34.2 mmHg (ref 32.0–48.0)
pH, Arterial: 7.464 — ABNORMAL HIGH (ref 7.350–7.450)
pO2, Arterial: 76 mmHg — ABNORMAL LOW (ref 83.0–108.0)

## 2019-09-13 LAB — DRUG SCREEN 10 W/CONF, SERUM
Amphetamines, IA: NEGATIVE ng/mL
Barbiturates, IA: NEGATIVE ug/mL
Benzodiazepines, IA: NEGATIVE ng/mL
Cocaine & Metabolite, IA: NEGATIVE ng/mL
Methadone, IA: NEGATIVE ng/mL
Opiates, IA: NEGATIVE ng/mL
Oxycodones, IA: NEGATIVE ng/mL
Phencyclidine, IA: NEGATIVE ng/mL
Propoxyphene, IA: NEGATIVE ng/mL
THC(Marijuana) Metabolite, IA: NEGATIVE ng/mL

## 2019-09-13 LAB — GLUCOSE, CAPILLARY
Glucose-Capillary: 145 mg/dL — ABNORMAL HIGH (ref 70–99)
Glucose-Capillary: 128 mg/dL — ABNORMAL HIGH (ref 70–99)
Glucose-Capillary: 125 mg/dL — ABNORMAL HIGH (ref 70–99)
Glucose-Capillary: 111 mg/dL — ABNORMAL HIGH (ref 70–99)
Glucose-Capillary: 143 mg/dL — ABNORMAL HIGH (ref 70–99)
Glucose-Capillary: 136 mg/dL — ABNORMAL HIGH (ref 70–99)

## 2019-09-13 LAB — SODIUM: Sodium: 154 mmol/L — ABNORMAL HIGH (ref 135–145)

## 2019-09-13 LAB — ABO/RH: ABO/RH(D): O POS

## 2019-09-13 LAB — SODIUM, URINE, RANDOM: Sodium, Ur: 57 mmol/L

## 2019-09-13 LAB — PROCALCITONIN: Procalcitonin: 0.76 ng/mL

## 2019-09-13 LAB — CREATININE, URINE, RANDOM: Creatinine, Urine: 145.19 mg/dL

## 2019-09-13 MED ORDER — PANTOPRAZOLE SODIUM 40 MG PO PACK
40.0000 mg | PACK | Freq: Every day | ORAL | Status: DC
  Administered 2019-09-13: 12:00:00 40 mg
  Filled 2019-09-13: qty 20

## 2019-09-13 MED ORDER — VANCOMYCIN HCL IN DEXTROSE 750-5 MG/150ML-% IV SOLN
750.0000 mg | INTRAVENOUS | Status: DC
  Filled 2019-09-13: qty 150

## 2019-09-13 MED ORDER — PANTOPRAZOLE SODIUM 40 MG PO PACK
40.0000 mg | PACK | Freq: Two times a day (BID) | ORAL | Status: DC
  Administered 2019-09-13 – 2020-01-07 (×228): 40 mg
  Filled 2019-09-13 (×235): qty 20

## 2019-09-13 MED ORDER — POTASSIUM CHLORIDE 20 MEQ/15ML (10%) PO SOLN
20.0000 meq | Freq: Once | ORAL | Status: AC
  Administered 2019-09-13: 20 meq
  Filled 2019-09-13: qty 15

## 2019-09-13 MED ORDER — SODIUM CHLORIDE 0.9 % IV BOLUS
1000.0000 mL | Freq: Once | INTRAVENOUS | Status: AC
  Administered 2019-09-13: 1000 mL via INTRAVENOUS

## 2019-09-13 NOTE — Progress Notes (Signed)
Pharmacy Antibiotic Note  Joshua [name] is a 38 y.o. male admitted on 09/09/2019 s/p seizures and R-sided hemiplegia now with possible aspiration pneumonia. Patient received two doses of perioperative Ancef on 9/12. Pharmacy has been consulted for Vancomycin dosing.  Patient is now afebrile (Tmax 102.2). WBC remains elevated at 12.4 but trending down from 18.8. Renal function continued to worsen with Scr 2.82 from 2.34. UOP around 1.75 L yesterday.   Plan: Decrease Vancomycin to 750 mg IV Q24 hrs - Goal AUC 400-550 - Expected AUC: 482 - Scr used: 2.82 Continue Zosyn 3.375 g IV q8 hrs Monitor cultures and sensitivities, renal function, clinical progression Consider vancomycin levels at steady state  Height: 5\' 9"  (175.3 cm) Weight: 141 lb 12.1 oz (64.3 kg) IBW/kg (Calculated) : 70.7  Temp (24hrs), Avg:99.7 F (37.6 C), Min:97.2 F (36.2 C), Max:102.2 F (39 C)  Recent Labs  Lab 09/09/19 0226  09/09/19 0852 09/10/19 0449 09/11/19 0445 09/12/19 0911 09/13/19 0311  WBC 11.1*  --   --  24.3* 18.8*  --  12.4*  CREATININE 2.29*   < > 2.08* 2.17* 2.34* 2.42* 2.82*   < > = values in this interval not displayed.    Estimated Creatinine Clearance: 32 mL/min (A) (by C-G formula based on SCr of 2.82 mg/dL (H)).    No Known Allergies  Antimicrobials this admission: Cefazolin 9/12 x2 Vancomycin 9/14 >> Zosyn 9/14 >>  Dose adjustments this admission: 9/14: Started vancomycin 1000 mg q24h (est AUC 541 w/ Scr 2.34) 9/16: Dec vancomycin to 750 mg q24h (Scr 2.82 - 1000 mg q24h would give est AUC 643)  Microbiology results: 9/14 Sputum: abundant GNR, few GPC, mod wbc 9/12 Sputum: mod WBC  9/12 MRSA PCR: neg  Richardine Service, PharmD PGY1 Pharmacy Resident Phone: [phone] 09/13/2019  7:30 AM  Please check [email or web address] for unit-specific pharmacy phone numbers.

## 2019-09-13 NOTE — Progress Notes (Signed)
Patient transported to CT scan and back to 4N20 without incidence.

## 2019-09-13 NOTE — Progress Notes (Signed)
Subjective: Patient reports intubated, sedated, on vent.  Objective: Vital signs in last 24 hours: Temp:  [97.2 F (36.2 C)-102.2 F (39 C)] 98.6 F (37 C) (09/16 0700) Pulse Rate:  [55-142] 60 (09/16 0700) Resp:  [12-30] 18 (09/16 0700) BP: (112-153)/(66-116) 115/74 (09/16 0700) SpO2:  [94 %-100 %] 100 % (09/16 0700) Arterial Line BP: (118-169)/(65-92) 153/82 (09/16 0415) FiO2 (%):  [40 %] 40 % (09/16 0407) Weight:  [64.3 kg] 64.3 kg (09/16 0500)  Intake/Output from previous day: 09/15 0701 - 09/16 0700 In: 2305.5 [I.V.:743.1; NG/GT:1200; IV Piggyback:362.4] Out: 1750 [Urine:1750] Intake/Output this shift: No intake/output data recorded.  Physical Exam: Exam is stable.  Purposeful left upper extremity and leg.  Weak withdrawal on right side to noxious stimulus.  Pupils equal, 3 mm, gaze is midline.  Dressings CDI.  Lab Results: Recent Labs    09/11/19 0445 09/13/19 0311 09/13/19 0409  WBC 18.8* 12.4*  --   HGB 8.5* 7.3* 9.2*  HCT 26.0* 23.0* 27.0*  PLT 124* 140*  --    BMET Recent Labs    09/12/19 0911  09/13/19 0311 09/13/19 0409  NA 156*   < > 152* 153*  K 3.6  --  3.8 3.7  CL 123*  --  118*  --   CO2 24  --  23  --   GLUCOSE 122*  --  152*  --   BUN 23*  --  27*  --   CREATININE 2.42*  --  2.82*  --   CALCIUM 8.8*  --  8.7*  --    < > = values in this interval not displayed.    Studies/Results: [employer]  Result Date: 09/11/2019 If [employer] not attached, placement could not be confirmed due to current cardiac rhythm.   Assessment/Plan: Head CT improving with 3 mm shift at this point.  Continue supportive care. 3% NaCl d/ced.  Alcohol withdrawal being treated.  Will likely need trach, PEG, SNF placement.    LOS: 4 days    Joshua Shoals, MD 09/13/2019, 7:47 AM

## 2019-09-13 NOTE — Progress Notes (Addendum)
PULMONARY / CRITICAL CARE MEDICINE   NAME:  Joshua [name], MRN:  [id number], DOB:  Mar 03, 1981, LOS: 4 ADMISSION DATE:  09/09/2019, CONSULTATION DATE: 09/09/2019 REFERRING MD: Neurosurgery, CHIEF COMPLAINT: Vent dependent respiratory failure secondary to intracranial hemorrhage  BRIEF HISTORY:    38 year old history of alcohol abuse seizures noncompliant hypertension who presented with new seizure right-sided hemiplegia confusion was found to have a large left intracranial hemorrhage for which he underwent craniectomy.  HISTORY OF PRESENT ILLNESS   38 year old male with a history of alcohol abuse to the point of alcohol poisoning resulting in a seizure.  He was in a drug house and was noted to have a seizure and woke up with right-sided hemiplegia.  He was noted to have vomited, urinary incontinence, bowel incontinence with confusion.  He was nonverbal and not following commands.  EMS was activated he is transported to [facility] evaluated by neurology and neurosurgery and was taken to the operating room for urgent left frontal temporal craniectomy evacuation hematoma with skull flap implantation. He was admitted to NSICU intubated.  SIGNIFICANT PAST MEDICAL HISTORY   Hypertension Alcohol abuse  SIGNIFICANT EVENTS:  09/08/2021 Admitted/  operating room for left frontal temporal craniectomy and evacuation hematoma 9/14 off hypertonic saline, fevers/ started on abx  9/15 ? Withdrawals?, started on precedex, cleviprex off overnight, weaned 8 hrs on SBT 5/5   STUDIES:   CT of head 09/09/2019 large left intracerebral hemorrhage CT of head 09/10/2019-decreased size of left basal ganglia hematoma with expected postoperative air.  Mild improvement of associated mass-effect.  Redistribution of intraventricular blood.  [facility] 9/16 >>   CULTURES:  09/09/2019 sputum>>normal flora 9/12 MRSA PCR >> neg 9/12 COVID >> neg 9/13 blood >> never collected  9/14 sputum>> normal flora  ANTIBIOTICS:   vanc 9/14>> 9/16 Pip-tazo 9/14>>  LINES/TUBES:  09/09/2019 endotracheal tube 09/09/2019 orogastric tube >> 9/14 09/09/2019 right radial A-line >> 9/15 9/14 L cortrak >>  CONSULTANTS:  09/09/2019 pulmonary critical care 09/09/2019 neurology 09/09/2019 NSGY  SUBJECTIVE:  Weaned 8 hours yesterday on PSV 5/5 CTH pending from this morning tmax overnight 100.4 Off cleviprex Remains on dex 0.5 mcg/kg/min Worsening sCr/ UOP remains adequate Hgb trend 14.5- 10.7- 8.5 - 7.3  CONSTITUTIONAL: BP 115/74   Pulse 60   Temp 98.6 F (37 C)   Resp 18   Ht 5\' 9"  (1.753 m)   Wt 64.3 kg   SpO2 100%   BMI 20.93 kg/m   I/O last 3 completed shifts: In: 29 [I.V.:2375.1; NG/GT:1800; IV Piggyback:498] Out: 5000 [Urine:5000]  Vent Mode: PRVC FiO2 (%):  [40 %] 40 % Set Rate:  [18 bmp] 18 bmp Vt Set:  [560 mL] 560 mL PEEP:  [5 cmH20] 5 cmH20 Pressure Support:  [5 cmH20] 5 cmH20 Plateau Pressure:  [10 [id number] cmH20] 12 cmH20  PHYSICAL EXAM: General:  Critically ill young adult male in NAD on MV HEENT: MM pink/moist, pupils 2/sluggish, ETT 7.5 at 25 lip, left nare cortrak, bite block in place, large bulky kerlex crani dressing- cdi Neuro: does not open eyes, moves LUE purposefully- in restraints, LLE spont, right hemiplegia, some tremors when stimulated  CV: rr, no murmur PULM:  Currently tolerating SBT 5/5 well, no distress, mild tachypnea in lower 20s, lungs clear GI: +bs, taut- large BM overnight, flap site to right abd cdi, condom cath in place Extremities: warm/dry, no LE edema Skin: no rashes  RESOLVED PROBLEM LIST   ASSESSMENT AND PLAN   Critically ill due to ventilator dependent respiratory failure  in the setting of large left intracerebral hemorrhage.  - continue full MV support PRVC 8 cc/kg, decrease rate to 16  - goal plateau pressure < 30 - daily SBT, however mental status continues to be barrier to extubation - VAP/ PPI bundle - PAD protocol with precedex, minimize  sedation as able - trend CXR/ ABG  VAP vs aspiration pneumonia vs pneumonitis.  Had vomited prior to admission. 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 -Consider adding a third agent to speed weaning of clevidipine.  AKI -unclear baseline, worsening sCr 9/16, UOP remains ~1.1 ml/kg/hr Improving hyperchloremic acidosis  - KCL 20 meq x1  - sending urine lytes, consider renal ultrasound  - ACEi stopped on 9/14, additionally on vanc/ zosyn - continue condom cath, monitor for urinary retention - strict I/O's, daily BMP/ Mag/ phos - renal dose meds   Polysubstance/ ETOH abuse.  Some concern for withdrawal on 9/15 - continue precedex with CIWA - daily thiamine/ folate/ MVI - seizure precautions  Hyperglycemia- - CBG q 4/ SSI moderate  Anemia - likely ABLA, Hgb trend 14.5- 10.7- 8.5 - 7.3 Thrombocytopenia- trend stable  - no current signs of bleeding - trending CBC, transfuse for Hgb < 7 - send T&S and CBC at 1600  Constipation - last BM overnight 9/15 - prn bowel regimen - goal K > 4  Best Practice / Goals of Care / Disposition.    DVT PROPHYLAXIS: SCDs, [place] heparin  GI prophylaxis: PPI per tube  Pain agitation: RASS goal 0/-1, precedex, prn fentanyl NUTRITION: NPO/  tube feedings at goal  MOBILITY: Bedrest GOALS OF  CARE: Full code Foley catheter: condom catheter FAMILY DISCUSSIONS: per primary DISPOSITION neuro ICU  LABS  Glucose Recent Labs  Lab 09/12/19 0806 09/12/19 1153 09/12/19 1631 09/12/19 1925 09/12/19 2341 09/13/19 0331  GLUCAP 133* 124* 119* 124* 132* 145*    BMET Recent Labs  Lab 09/11/19 0445  09/12/19 0911  09/12/19 2147 09/13/19 0311 09/13/19 0409  NA 146*   < > 156*   < > 152* 152* 153*  K 4.0  --  3.6  --   --  3.8 3.7  CL 121*  --  123*  --   --  118*  --   CO2 20*  --  24  --   --  23  --   BUN 27*  --  23*  --   --  27*  --   CREATININE 2.34*  --  2.42*  --   --  2.82*  --   GLUCOSE 155*  --  122*  --   --  152*  --    < > = values in this interval not displayed.    Liver Enzymes Recent Labs  Lab 09/09/19 0226  AST 23  ALT 17  ALKPHOS  73  BILITOT 0.4  ALBUMIN 4.1    Electrolytes Recent Labs  Lab 09/09/19 0407  09/10/19 0449 09/11/19 0445 09/12/19 0911 09/13/19 0311  CALCIUM  --    < > 8.7* 8.4* 8.8* 8.7*  MG 1.9  --  2.1  --   --   --   PHOS 2.2*  --  1.3*  --   --   --    < > = values in this interval not displayed.    CBC Recent Labs  Lab 09/10/19 0449 09/11/19 0445 09/13/19 0311 09/13/19 0409  WBC 24.3* 18.8* 12.4*  --   HGB 10.7* 8.5* 7.3* 9.2*  HCT 31.3* 26.0* 23.0* 27.0*  PLT 147* 124* 140*  --     ABG Recent Labs  Lab 09/09/19 0412 09/09/19 0835 09/13/19 0409  PHART 7.443 7.418 7.464*  PCO2ART 44.4 31.1* 34.2  PO2ART 592.0* 202* 76.0*    Coags Recent Labs  Lab 09/09/19 0226  APTT 29  INR 0.9    Sepsis Markers No results for input(s): LATICACIDVEN, PROCALCITON, O2SATVEN in the last 168 hours.  Cardiac Enzymes No results for input(s): TROPONINI, PROBNP in the last 168 hours.     I spent 45 minutes in direct patient care including reviewing data,  discussing with other providers, assessment, planning and stabilization and documentation. Time is exclusive to this patient and does not include procedures.     Kennieth Rad, MSN, AGACNP-BC  Pulmonary & Critical Care Pgr: [phone] or if no answer [phone] 09/13/2019, 7:35 AM

## 2019-09-13 NOTE — Progress Notes (Signed)
STROKE TEAM PROGRESS NOTE   INTERVAL HISTORY Patient neurological condition remains unchanged.  He is sedated with low-dose Precedex intubated and on ventilatory support for respiratory failure.  CT scan of the head shows stable appearance of right-sided hematoma with postoperative changes and stable mild right to left shift.  He has been off hypertonic saline for 2 days and serum sodium is 152 this morning.  He is weaning on ventilator.  He remains on antibiotics and white count has come down significantly and is developing mild renal insufficiency.  OBJECTIVE Vitals:   09/13/19 0700 09/13/19 0754 09/13/19 0755 09/13/19 0800  BP: 115/74 123/78  133/89  Pulse: 60 75  77  Resp: 18 (!) 26  (!) 28  Temp: 98.6 F (37 C)   99.7 F (37.6 C)  TempSrc:    Esophageal  SpO2: 100% 99% 99% 100%  Weight:      Height:        CBC:  Recent Labs  Lab 09/10/19 0449 09/11/19 0445 09/13/19 0311 09/13/19 0409  WBC 24.3* 18.8* 12.4*  --   NEUTROABS 21.3* 16.4*  --   --   HGB 10.7* 8.5* 7.3* 9.2*  HCT 31.3* 26.0* 23.0* 27.0*  MCV 88.2 91.2 91.6  --   PLT 147* 124* 140*  --     Basic Metabolic Panel:  Recent Labs  Lab 09/09/19 0407  09/10/19 0449  09/12/19 0911  09/13/19 0311 09/13/19 0409  NA  --    < > 137   < > 156*   < > 152* 153*  K  --    < > 3.9   < > 3.6  --  3.8 3.7  CL  --    < > 109   < > 123*  --  118*  --   CO2  --    < > 20*   < > 24  --  23  --   GLUCOSE  --    < > 133*   < > 122*  --  152*  --   BUN  --    < > 26*   < > 23*  --  27*  --   CREATININE  --    < > 2.17*   < > 2.42*  --  2.82*  --   CALCIUM  --    < > 8.7*   < > 8.8*  --  8.7*  --   MG 1.9  --  2.1  --   --   --   --   --   PHOS 2.2*  --  1.3*  --   --   --   --   --    < > = values in this interval not displayed.    Lipid Panel:     Component Value Date/Time   TRIG 115 09/10/2019 0449   HgbA1c:  Lab Results  Component Value Date   HGBA1C 5.6 09/10/2019   Urine Drug Screen:     Component Value  Date/Time   LABOPIA NONE DETECTED 09/09/2019 0349   COCAINSCRNUR NONE DETECTED 09/09/2019 0349   LABBENZ NONE DETECTED 09/09/2019 0349   AMPHETMU NONE DETECTED 09/09/2019 0349   THCU NONE DETECTED 09/09/2019 0349   LABBARB NONE DETECTED 09/09/2019 0349    Alcohol Level     Component Value Date/Time   ETH <10 09/09/2019 0407    IMAGING Ct Head Code Stroke Wo Contrast 09/09/2019 1. Large intraparenchymal hematoma of the left basal  ganglia measuring 7.8 x 4.8 cm with 14 mm of rightward midline shift and early subfalcine herniation.  2. Intraventricular extension of hemorrhage. Small focus of intraparenchymal hemorrhage in the right basal ganglia.   CT head  CTA Head  09/10/2019 1. Status post left pterional craniectomy for evacuation of left basal ganglia hematoma. Residual hematoma with interspersed postoperative gas measures 6.1 x 2.9 cm. 2. Decreased midline shift, now measuring 6 mm to the right. 3. Unchanged punctate focus of hemorrhage at the posterior right insula. 4. Unchanged intraventricular blood. 5. No intracranial arterial occlusion or high-grade stenosis. No aneurysm or vascular malformation.   CT head  09/13/2019 Stable changes post left frontoparietal craniectomy for evacuation of left-sided central hemorrhagic stroke. Residual without significant change measuring 2.8 x 6.5 cm with less air within the hemorrhage. Stable postsurgical change with mild edema/mass effect and stable midline shift to the right of 5-6 mm. Decrease in density 4 mm focus of hemorrhage over the right basal ganglia.  Dg Chest Port 1 View 09/10/2019 Resolving right midlung atelectasis. Tubes and lines as described. 09/09/2019 No acute cardiopulmonary abnormality. Appropriate positioning of the endotracheal and transesophageal tubes.   ECG - SR rate 97 BPM. LVH and prolonged QT interval. (See cardiology reading for complete details)   PHYSICAL EXAM     [name] who is sedated  intubated.  Not in distress. . Afebrile. Head is nontraumatic. Neck is supple without bruit.    Cardiac exam no murmur or gallop. Lungs are clear to auscultation. Distal pulses are well felt.  Neurological Exam  Patient is sedated intubated   Eyes are closed.  He does not follow any commands.  Pupils 2 mm sluggishly reactive.  Left gaze deviation doll's eye movements are absent.  Corneal reflexes are sluggish.  Fundi not visualized.  Face is symmetric.  Tongue midline.  Intermittent left upper and lower extremity spontaneous motor movements.  Semipurposeful localization on the left.  Trace flexion in the right lower extremity to pain.  No movement in the right upper extremity.  Plantars are downgoing.   ASSESSMENT/PLAN [name] is a 38 y.o. male with history of admission for Etoh poisoning presenting with a seizure, vomiting.   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   NSG - Vertell Limber - decompressive craniectomy w/ flap in R abd - 09/09/19 Dr Vertell Limber  Code Stroke CT Head - Large intraparenchymal hematoma of the left basal ganglia measuring 7.8 x 4.8 cm with 14 mm of rightward midline shift and early subfalcine herniation. Intraventricular extension of hemorrhage. Small focus of intraparenchymal hemorrhage in the right basal ganglia.   CT/CTA head - 09/10/2019 - Status post left pterional craniectomy for evacuation of left basal ganglia hematoma. Residual hematoma with interspersed postoperative gas measures 6.1 x 2.9 cm. Decreased midline shift, now measuring 6 mm to the right. No intracranial arterial occlusion or high-grade stenosis. No aneurysm or vascular malformation  Ct head 9/16 stable crani. Stable stroke.5-6 midline shift. Decreased R basal ganglia hemorrhage   HgbA1c 5.6  [employer] Virus 2 - negative  UDS - negative  VTE prophylaxis - Heparin 5000 units sq tid   No  antithrombotic prior to admission, now on No antithrombotic d/t hemorrhage   Therapy recommendations:  pending  Disposition:  Pending  Cerebral Edema  S/p crani  Treated with 3% saline protocol, now off  Na 152  Respiratory Failure  Secondary to [place]  Intubated  Minimize Sedation  CCM on board  VAP vs Aspiration PNA (vomited PTA) Leukocytosis, Febrile  WBC 11.1->24.3->18.8->12.4  T Max 102.2  CXR - resolving atx  U/A neg 9/14  Blood cultures normal flora  D/c Vanc, continue Zosyn  Hypertensive Emergency  Probable undiagnosed / untreated hypertension (severe LVH on ECG)  Home BP meds: none  Current BP meds: Cleviprex, amlodipine 10, hydralazine 50 tid, labetolol 200 tid    Off lisinopril D/t increasing Cr  SBP goal < 160 mm Hg  Weaning cleviprex    Seizure  On presentation  Hx 1 seizure previously  on Keppra 500 mg IV Bid  Hyperlipidemia  Home Lipid lowering medication: none  LDL - 115  Current lipid lowering medication: None  Consider statin at discharge  Dysphagia Malnutrition . Secondary to stroke . NPO  Tube Feedings started 09/09/19  Has cortrak  Other Stroke Risk Factors  Hx ETOH abuse, recent hospitalization for alcohol poisoning. Drinks 1 pt [place] per day. Tachycardia w/ tremors, temp. Placed on CIWA protocol.  Polysubstance abuse  Other Active Problems  Hypokalemia- 2.1 -> supplemented->4.0->3.8->3.7  AKI, ? CKD. Creatinine - 2.82  Mild Thrombocytopenia - 140  Phos - 2.2-1.3  Anemia Hb 9.2  Hospital day # 4 Patient's mental status and neurological exam has not improved significantly despite decompressive craniotomy and hemicraniectomy and he will likely need prolonged ventilatoy support, tracheostomy and PEG tube for nourishment.  Continue antibiotics as well as management of renal insufficiency as per critical care team.  Discussed with Dr. Noemi Chapel, CCM This patient is critically ill and at significant  risk of neurological worsening, death and care requires constant monitoring of vital signs, hemodynamics,respiratory and cardiac monitoring, extensive review of multiple databases, frequent neurological assessment, discussion with family, other specialists and medical decision making of high complexity.I have made any additions or clarifications directly to the above note.This critical care time does not reflect procedure time, or teaching time or supervisory time of PA/NP/Med Resident etc but could involve care discussion time.  I spent 30 minutes of neurocritical care time  in the care of  this patient.     Antony Contras, MD Medical Director [facility] Pager: [phone] 09/13/2019 9:15 AM  To contact Stroke Continuity provider, please refer to [email or web address] After hours, contact General Neurology

## 2019-09-14 DIAGNOSIS — I612 Nontraumatic intracerebral hemorrhage in hemisphere, unspecified: Secondary | ICD-10-CM

## 2019-09-14 DIAGNOSIS — J9600 Acute respiratory failure, unspecified whether with hypoxia or hypercapnia: Secondary | ICD-10-CM

## 2019-09-14 LAB — RENAL FUNCTION PANEL
Albumin: 2.1 g/dL — ABNORMAL LOW (ref 3.5–5.0)
Anion gap: 10 (ref 5–15)
BUN: 39 mg/dL — ABNORMAL HIGH (ref 6–20)
CO2: 23 mmol/L (ref 22–32)
Calcium: 8.7 mg/dL — ABNORMAL LOW (ref 8.9–10.3)
Chloride: 122 mmol/L — ABNORMAL HIGH (ref 98–111)
Creatinine, Ser: 2.8 mg/dL — ABNORMAL HIGH (ref 0.61–1.24)
GFR calc Af Amer: 32 mL/min — ABNORMAL LOW (ref >60)
GFR calc non Af Amer: 27 mL/min — ABNORMAL LOW (ref >60)
Glucose, Bld: 129 mg/dL — ABNORMAL HIGH (ref 70–99)
Phosphorus: 5.1 mg/dL — ABNORMAL HIGH (ref 2.5–4.6)
Potassium: 3.6 mmol/L (ref 3.5–5.1)
Sodium: 155 mmol/L — ABNORMAL HIGH (ref 135–145)

## 2019-09-14 LAB — CBC
HCT: 21 % — ABNORMAL LOW (ref 39.0–52.0)
Hemoglobin: 6.9 g/dL — CL (ref 13.0–17.0)
MCH: 30.3 pg (ref 26.0–34.0)
MCHC: 32.9 g/dL (ref 30.0–36.0)
MCV: 92.1 fL (ref 80.0–100.0)
Platelets: 139 10*3/uL — ABNORMAL LOW (ref 150–400)
RBC: 2.28 MIL/uL — ABNORMAL LOW (ref 4.22–5.81)
RDW: 15.9 % — ABNORMAL HIGH (ref 11.5–15.5)
WBC: 10.6 10*3/uL — ABNORMAL HIGH (ref 4.0–10.5)
nRBC: 0.6 % — ABNORMAL HIGH (ref 0.0–0.2)

## 2019-09-14 LAB — GLUCOSE, CAPILLARY
Glucose-Capillary: 129 mg/dL — ABNORMAL HIGH (ref 70–99)
Glucose-Capillary: 132 mg/dL — ABNORMAL HIGH (ref 70–99)
Glucose-Capillary: 116 mg/dL — ABNORMAL HIGH (ref 70–99)
Glucose-Capillary: 135 mg/dL — ABNORMAL HIGH (ref 70–99)
Glucose-Capillary: 124 mg/dL — ABNORMAL HIGH (ref 70–99)
Glucose-Capillary: 132 mg/dL — ABNORMAL HIGH (ref 70–99)

## 2019-09-14 LAB — MAGNESIUM: Magnesium: 2.2 mg/dL (ref 1.7–2.4)

## 2019-09-14 LAB — PREPARE RBC (CROSSMATCH)

## 2019-09-14 LAB — HEMOGLOBIN AND HEMATOCRIT, BLOOD
HCT: 24.3 % — ABNORMAL LOW (ref 39.0–52.0)
Hemoglobin: 7.8 g/dL — ABNORMAL LOW (ref 13.0–17.0)

## 2019-09-14 MED ORDER — SODIUM CHLORIDE 0.9% IV SOLUTION: Freq: Once | INTRAVENOUS | Status: DC

## 2019-09-14 MED ORDER — SODIUM CHLORIDE 0.9 % IV SOLN
3.0000 g | Freq: Three times a day (TID) | INTRAVENOUS | Status: AC
  Administered 2019-09-14 – 2019-09-17 (×11): 3 g via INTRAVENOUS
  Filled 2019-09-14: qty 3
  Filled 2019-09-14: qty 8
  Filled 2019-09-14 (×5): qty 3
  Filled 2019-09-14 (×2): qty 8
  Filled 2019-09-14: qty 3
  Filled 2019-09-14 (×2): qty 8

## 2019-09-14 MED ORDER — PRO-STAT SUGAR FREE PO LIQD
30.0000 mL | Freq: Two times a day (BID) | ORAL | Status: DC
  Administered 2019-09-14 – 2019-09-25 (×24): 30 mL
  Filled 2019-09-14 (×23): qty 30

## 2019-09-14 MED ORDER — OSMOLITE 1.5 CAL PO LIQD
1000.0000 mL | ORAL | Status: DC
  Administered 2019-09-14 – 2019-09-26 (×12): 1000 mL
  Filled 2019-09-14 (×17): qty 1000

## 2019-09-14 NOTE — Progress Notes (Addendum)
Nutrition Follow-up  DOCUMENTATION CODES:   Not applicable  INTERVENTION:   D/C Vital High Protein  Osmolite 1.5 @ 55 ml/hr via Cortrak 30 ml Prostat BID  Provides: 2180 kcal, 112 grams protein, and 1012 ml free water.   NUTRITION DIAGNOSIS:   Inadequate oral intake related to inability to eat as evidenced by NPO status. Ongoing.   GOAL:   Patient will meet greater than or equal to 90% of their needs Progressing.   MONITOR:   Vent status, TF tolerance, Labs, Skin  REASON FOR ASSESSMENT:   Ventilator, Consult Enteral/tube feeding initiation and management  ASSESSMENT:   38 yo male admitted with large left intracranial hemorrhage; S/P craniotomy. PMH includes HTN, alcoholism, noncompliance.   Pt is weaning on vent. On precedex due to hx of ETOH abuse. Cleviprex off. May need trach/PEG.  Pt discussed during ICU rounds and with RN.   9/14 cortrak placed, tip in stomach  Patient is currently intubated on ventilator support MV: 13 L/min Temp (24hrs), Avg:99.9 F (37.7 C), Min:97.9 F (36.6 C), Max:101.3 F (38.5 C)   Labs reviewed. Potassium 2.1 (L)--> 3.9 WNL, BUN 21 (H), creatinine 2.08 (H), phosphorus 2.2 (L)  Medications reviewed and include folic acid, SSI, MVI, thiamine  Cortrak with Vital High Protein @ 50 ml/hr provides 1200 kcal, 105 gm protein, 1003 ml free water daily  NUTRITION - FOCUSED PHYSICAL EXAM:    Most Recent Value  Orbital Region  No depletion  Upper Arm Region  No depletion  Thoracic and Lumbar Region  No depletion  Buccal Region  Unable to assess  Temple Region  No depletion  Clavicle Bone Region  No depletion  Clavicle and Acromion Bone Region  No depletion  Scapular Bone Region  No depletion  Dorsal Hand  No depletion  Patellar Region  No depletion  Anterior Thigh Region  No depletion  Posterior Calf Region  No depletion  Edema (RD Assessment)  None  Hair  Reviewed  Eyes  Unable to assess  Mouth  Unable to assess  Skin   Reviewed  Nails  Reviewed       Diet Order:   Diet Order            Diet NPO time specified  Diet effective now              EDUCATION NEEDS:   Not appropriate for education at this time  Skin:  Skin Assessment: Reviewed RN Assessment(incisions to head and abdomen)  Last BM:  100 ml via rectal tube x 24 hrs  Height:   Ht Readings from Last 1 Encounters:  09/09/19 5\' 9"  (1.753 m)    Weight:   Wt Readings from Last 1 Encounters:  09/13/19 64.3 kg    Ideal Body Weight:  72.7 kg  BMI:  Body mass index is 20.93 kg/m.  Estimated Nutritional Needs:   Kcal:  2110  Protein:  95-105 gm  Fluid:  >/= 1.9 L  Maylon Peppers RD, LDN, CNSC [phone] Pager [phone] After Hours Pager

## 2019-09-14 NOTE — Progress Notes (Signed)
Subjective: Patient reports intubated, on vent  Objective: Vital signs in last 24 hours: Temp:  [97.9 F (36.6 C)-101.3 F (38.5 C)] 99.5 F (37.5 C) (09/17 0830) Pulse Rate:  [63-136] 88 (09/17 0830) Resp:  [16-40] 30 (09/17 0830) BP: (114-170)/(68-136) 137/107 (09/17 0830) SpO2:  [95 %-100 %] 98 % (09/17 0830) FiO2 (%):  [40 %] 40 % (09/17 0816)  Intake/Output from previous day: 09/16 0701 - 09/17 0700 In: 1799.2 [I.V.:237.7; Blood:120; NG/GT:1100; IV Piggyback:341.6] Out: 2550 [Urine:2450; Stool:100] Intake/Output this shift: No intake/output data recorded.  Physical Exam: Pupils reactive, purposeful with left side.  Minimal movement to noxious stimuli on right.  Incisons CDI.  Lab Results: Recent Labs    09/13/19 1511 09/14/19 0425  WBC 12.5* 10.6*  HGB 7.3* 6.9*  HCT 22.4* 21.0*  PLT 144* 139*   BMET Recent Labs    09/13/19 0311 09/13/19 0409 09/13/19 0850 09/13/19 1535  NA 152* 153* 154* 152*  K 3.8 3.7  --  3.7  CL 118*  --   --  121*  CO2 23  --   --  23  GLUCOSE 152*  --   --  124*  BUN 27*  --   --  35*  CREATININE 2.82*  --   --  2.81*  CALCIUM 8.7*  --   --  8.6*    Studies/Results: Ct Head Wo Contrast  Result Date: 09/13/2019 CLINICAL DATA:  Stroke follow-up. EXAM: CT HEAD WITHOUT CONTRAST TECHNIQUE: Contiguous axial images were obtained from the base of the skull through the vertex without intravenous contrast. COMPARISON:  09/10/2019 and 09/09/2019 FINDINGS: Brain: Evidence of patient's recent evacuation of central left-sided hemorrhagic stroke. Residual hemorrhage over the left basal ganglia measures approximately 2.8 x 6.5 cm without significant change. There is less air within the central aspect of the hemorrhage. Stable adjacent postsurgical change, edema and minimal mass effect. Stable mild midline shift to the right of 5-6 mm. Persistent 4 mm hemorrhagic focus over the right basal ganglia is becoming less dense. Vascular: No hyperdense  vessel or unexpected calcification. Skull: Evidence of recent right frontal parietal craniectomy. Skin staples over the left scalp. Sinuses/Orbits: Orbits are normal. Paranasal sinuses are well aerated. Other: Periodontal disease. IMPRESSION: Stable changes post left frontoparietal craniectomy for evacuation of left-sided central hemorrhagic stroke. Residual without significant change measuring 2.8 x 6.5 cm with less air within the hemorrhage. Stable postsurgical change with mild edema/mass effect and stable midline shift to the right of 5-6 mm. Decrease in density 4 mm focus of hemorrhage over the right basal ganglia. Electronically Signed   By: Marin Olp M.D.   On: 09/13/2019 08:24   US Renal  Result Date: 09/13/2019 CLINICAL DATA:  38 year old male with acute renal insufficiency. EXAM: RENAL / URINARY TRACT ULTRASOUND COMPLETE COMPARISON:  None. FINDINGS: Evaluation is somewhat limited due to inability of the patient to cooperate with the exam. Right Kidney: Renal measurements: 10.9 x 4.9 x 6.4 cm = volume: 179 mL. There is increased renal parenchymal echogenicity. There is mild hydronephrosis of the right kidney. No shadowing stone. Left Kidney: Renal measurements: 9.8 x 4.8 x 5.9 cm = volume: 145 mL. There is increased renal parenchymal echogenicity. Mild hydronephrosis. No shadowing stone. Bladder: The urinary bladder is distended otherwise unremarkable. Ureteral jets are not visualized. IMPRESSION: 1. Mildly echogenic kidneys likely related to underlying medical renal disease. 2. Mild bilateral hydronephrosis.  No shadowing stone. Electronically Signed   By: Anner Crete M.D.   On: 09/13/2019  11:41    Assessment/Plan: Stable, being transfused blood today.  Full care per family.  Will need trach, PEG, SNF.    LOS: 5 days    Peggyann Shoals, MD 09/14/2019, 8:36 AM

## 2019-09-14 NOTE — Progress Notes (Signed)
See lab order for bmet, mg. Blood infusing. Will draw lab upon completion of transfusion.

## 2019-09-14 NOTE — Progress Notes (Signed)
[facility] Progress Note [name] DOB: 1981/06/16 MRN: [id number]   Date of Service  09/14/2019  HPI/Events of Note  Anemia - Hgb = 6.9.  eICU Interventions  Will transfuse 1 unit PRBC     Intervention Category Major Interventions: Other:  Lysle Dingwall 09/14/2019, 5:12 AM

## 2019-09-14 NOTE — Progress Notes (Signed)
Pharmacy Antibiotic Note  Joshua [name] is a 38 y.o. male admitted on 09/09/2019 with VAP/aspiration pneumonia.  Patient is currently on day four of antibiotics. Pharmacy has been consulted for Unasyn dosing.  Fever is trending downward with Tmax of 101.3, and wbc down as well at 10.6. Renal function slightly worsening with a bump in Scr from 2.3 to 2.8. Patient is still making good urine with 2.5L UOP yesterday. Cultures are currently negative, but after discussing with CCM team, will not get new cultures and plan for total duration of 7 days of antibiotics due to his clinical presentation.  Plan: - Start Unasyn 3g IV Q8h through 9/20  - Discontinue Zosyn - Monitor renal function and clinical progression  Height: 5\' 9"  (175.3 cm) Weight: 141 lb 12.1 oz (64.3 kg) IBW/kg (Calculated) : 70.7  Temp (24hrs), Avg:99.9 F (37.7 C), Min:97.9 F (36.6 C), Max:101.3 F (38.5 C)  Recent Labs  Lab 09/10/19 0449 09/11/19 0445 09/12/19 0911 09/13/19 0311 09/13/19 1511 09/13/19 1535 09/14/19 0425  WBC 24.3* 18.8*  --  12.4* 12.5*  --  10.6*  CREATININE 2.17* 2.34* 2.42* 2.82*  --  2.81*  --     Estimated Creatinine Clearance: 32.1 mL/min (A) (by C-G formula based on SCr of 2.81 mg/dL (H)).    No Known Allergies  Antimicrobials this admission: Unasyn 9/17 >> (9/20) Zosyn 9/14 >> 9/17 Vancomycin 9/14 >> 9/16 Ancef x2 periop  Dose adjustments this admission: None  Microbiology results: 9/12 Sputum: normal respiratory flora  9/12 MRSA PCR: neg 9/12 COVID: neg  Richardine Service, PharmD PGY1 Pharmacy Resident Phone: [phone] 09/14/2019  9:19 AM  Please check [email or web address] for unit-specific pharmacy phone numbers.

## 2019-09-14 NOTE — Progress Notes (Signed)
STROKE TEAM PROGRESS NOTE   INTERVAL HISTORY Patient remains unresponsive with eyes closed and on ventilatory support for respiratory failure and pneumonia.  He received 1 unit of blood transfusion overnight for anemia.  His renal function is stable with creatinine of 2.8.  He continues to have episodes of bradycardia and near asystole with suctioning though overall his tracheal secretions are going down. OBJECTIVE Vitals:   09/14/19 1000 09/14/19 1100 09/14/19 1158 09/14/19 1200  BP: (!) 143/98 (!) 136/92 (!) 134/95 (!) 140/94  Pulse: 71 76 78 82  Resp: 20 (!) 23 (!) 25 (!) 25  Temp: 99.5 F (37.5 C) 100.2 F (37.9 C)  100 F (37.8 C)  TempSrc:      SpO2: 100% 100% 100% 100%  Weight:      Height:        CBC:  Recent Labs  Lab 09/10/19 0449 09/11/19 0445  09/13/19 1511 09/14/19 0425  WBC 24.3* 18.8*   < > 12.5* 10.6*  NEUTROABS 21.3* 16.4*  --   --   --   HGB 10.7* 8.5*   < > 7.3* 6.9*  HCT 31.3* 26.0*   < > 22.4* 21.0*  MCV 88.2 91.2   < > 91.8 92.1  PLT 147* 124*   < > 144* 139*   < > = values in this interval not displayed.    Basic Metabolic Panel:  Recent Labs  Lab 09/10/19 0449  09/13/19 1535 09/14/19 0853  NA 137   < > 152* 155*  K 3.9   < > 3.7 3.6  CL 109   < > 121* 122*  CO2 20*   < > 23 23  GLUCOSE 133*   < > 124* 129*  BUN 26*   < > 35* 39*  CREATININE 2.17*   < > 2.81* 2.80*  CALCIUM 8.7*   < > 8.6* 8.7*  MG 2.1  --   --  2.2  PHOS 1.3*  --   --  5.1*   < > = values in this interval not displayed.    Lipid Panel:     Component Value Date/Time   TRIG 115 09/10/2019 0449   HgbA1c:  Lab Results  Component Value Date   HGBA1C 5.6 09/10/2019   Urine Drug Screen:     Component Value Date/Time   LABOPIA NONE DETECTED 09/09/2019 0349   COCAINSCRNUR NONE DETECTED 09/09/2019 0349   LABBENZ NONE DETECTED 09/09/2019 0349   AMPHETMU NONE DETECTED 09/09/2019 0349   THCU NONE DETECTED 09/09/2019 0349   LABBARB NONE DETECTED 09/09/2019 0349     Alcohol Level     Component Value Date/Time   ETH <10 09/09/2019 0407    IMAGING Ct Head Code Stroke Wo Contrast 09/09/2019 1. 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  2. Intraventricular extension of hemorrhage. Small focus of intraparenchymal hemorrhage in the right basal ganglia.   CT head  CTA Head  09/10/2019 1. Status post left pterional craniectomy for evacuation of left basal ganglia hematoma. Residual hematoma with interspersed postoperative gas measures 6.1 x 2.9 cm. 2. Decreased midline shift, now measuring 6 mm to the right. 3. Unchanged punctate focus of hemorrhage at the posterior right insula. 4. Unchanged intraventricular blood. 5. No intracranial arterial occlusion or high-grade stenosis. No aneurysm or vascular malformation.   CT head  09/13/2019 Stable changes post left frontoparietal craniectomy for evacuation of left-sided  central hemorrhagic stroke. Residual without significant change measuring 2.8 x 6.5 cm with less air within the hemorrhage. Stable postsurgical change with mild edema/mass effect and stable midline shift to the right of 5-6 mm. Decrease in density 4 mm focus of hemorrhage over the right basal ganglia.  Dg Chest Port 1 View 09/15/2019 pending  09/10/2019 Resolving right midlung atelectasis. Tubes and lines as described. 09/09/2019 No acute cardiopulmonary abnormality. Appropriate positioning of the endotracheal and transesophageal tubes.   Renal US 09/13/2019 1. Mildly echogenic kidneys likely related to underlying medical renal disease. 2. Mild bilateral hydronephrosis.  No shadowing stone.  ECG - SR rate 97 BPM. LVH and prolonged QT interval. (See cardiology reading for complete details)   PHYSICAL EXAM    Dr. Leonie Man she spikes fever Joshua [name] who is sedated intubated.  Not in distress. . Afebrile. Head is  nontraumatic. Neck is supple without bruit.    Cardiac exam no murmur or gallop. Lungs are clear to auscultation. Distal pulses are well felt.  Neurological Exam  Patient is sedated intubated   Eyes are closed.  He does not follow any commands.  Pupils 2 mm sluggishly reactive.  Left gaze deviation doll's eye movements are absent.  Corneal reflexes are sluggish.  Fundi not visualized.  Face is symmetric.  Tongue midline.  Intermittent left upper and lower extremity spontaneous motor movements.-Shivering due to fever semipurposeful localization on the left.  Trace flexion in the right lower extremity to pain.  No movement in the right upper extremity.  Plantars are downgoing.   ASSESSMENT/PLAN Joshua [name] is a 38 y.o. [name] with history of admission for Etoh poisoning presenting with a seizure, vomiting.   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   NSG - Vertell Limber - decompressive craniectomy w/ flap in R abd - 09/09/19 Dr Vertell Limber  Code Stroke CT Head - Large intraparenchymal hematoma of the left basal ganglia measuring 7.8 x 4.8 cm with 14 mm of rightward midline shift and early subfalcine herniation. Intraventricular extension of hemorrhage. Small focus of intraparenchymal hemorrhage in the right basal ganglia.   CT/CTA head - 09/10/2019 - Status post left pterional craniectomy for evacuation of left basal ganglia hematoma. Residual hematoma with interspersed postoperative gas measures 6.1 x 2.9 cm. Decreased midline shift, now measuring 6 mm to the right. No intracranial arterial occlusion or high-grade stenosis. No aneurysm or vascular malformation  Ct head 9/16 stable crani. Stable stroke.5-6 midline shift. Decreased R basal ganglia hemorrhage   HgbA1c 5.6  [employer] Virus 2 - negative  UDS - negative  VTE prophylaxis - Heparin 5000 units sq tid   No antithrombotic prior to  admission, now on No antithrombotic d/t hemorrhage   Therapy recommendations:  pending  Disposition:  Pending  Cerebral Edema Comatose state  S/p crani  Treated with 3% saline protocol, now off  Na 155  Respiratory Failure  Secondary to [place]  Intubated  Minimize Sedation  Tolerating trials but will likely need trach per CCM  CCM on board  VAP vs Aspiration PNA (vomited PTA) Leukocytosis, Febrile  WBC 10.6  T Max 101.3  CXR - resolving atx  U/A neg 9/14  Blood cultures normal flora  Repeat CXR tomorrow  D/c Vanc, d/c Zosyn -> unasyn  Hypertensive Emergency  Probable undiagnosed / untreated hypertension (severe LVH on ECG)  Home BP meds:  none  Current BP meds: amlodipine 10, hydralazine 50 tid, labetolol 200 tid    Off lisinopril D/t increasing Cr  SBP goal < 160 mm Hg     Off cleviprex    Seizure  On presentation  Hx 1 seizure previously  on Keppra 500 mg IV Bid  Hyperlipidemia  Home Lipid lowering medication: none  LDL - 115  Current lipid lowering medication: None  Consider statin at discharge  Dysphagia Malnutrition . Secondary to stroke . NPO  Tube Feedings started 09/09/19  Has cortrak  Hyperglycemia  HgbA1c 5.6  CBGs  SSI  Anemia  Thrombocytopenia- stable  Hgb 14.5- 10.7- 8.5 - 7.3 - 6.9 - 1 unit - recheck  unclear etiology  on PPI BID  Other Stroke Risk Factors  Hx ETOH abuse, recent hospitalization for alcohol poisoning. Drinks 1 pt Hennessey per day. Tachycardia w/ tremors, temp. Placed on CIWA protocol.  Polysubstance abuse  Other Active Problems  Hypokalemia, resolved 3.6  AKI, ? CKD. ACE stopped 9/14. Renal US mild echogenic kidneys. Creatinine - 2.80  Mild Thrombocytopenia - 139  Phos - 2.2-1.3-5.1  Hospital day # 5   I spoke to his girlfriend at the bedside and with his mother over the phone and feels that he is likely going to need tracheostomy PEG tube and nursing home placement.   Mother is disappointed at not seeing rapid improvement but will speak to other family members and make a final decision on trach and PEG Discussed with Dr. Elsworth Soho, patient's girlfriend and mother and answered questions This patient is critically ill and at significant risk of neurological worsening, death and care requires constant monitoring of vital signs, hemodynamics,respiratory and cardiac monitoring, extensive review of multiple databases, frequent neurological assessment, discussion with family, other specialists and medical decision making of high complexity.I have made any additions or clarifications directly to the above note.This critical care time does not reflect procedure time, or teaching time or supervisory time of PA/NP/Med Resident etc but could involve care discussion time.  I spent 30 minutes of neurocritical care time  in the care of  this patient.     Antony Contras, MD Medical Director [facility] Pager: [phone] 09/14/2019 12:46 PM  To contact Stroke Continuity provider, please refer to [email or web address] After hours, contact General Neurology

## 2019-09-14 NOTE — Progress Notes (Addendum)
PULMONARY / CRITICAL CARE MEDICINE   NAME:  Joshua [name], MRN:  [id number], DOB:  03-03-1981, LOS: 5 ADMISSION DATE:  09/09/2019, CONSULTATION DATE: 09/09/2019 REFERRING MD: Neurosurgery, CHIEF COMPLAINT: Vent dependent respiratory failure secondary to intracranial hemorrhage BRIEF HISTORY:    38 year old history of alcohol abuse seizures noncompliant hypertension who presented with new seizure right-sided hemiplegia confusion was found to have a large left intracranial hemorrhage for which he underwent craniectomy.  HISTORY OF PRESENT ILLNESS   38 year old male with a history of alcohol abuse to the point of alcohol poisoning resulting in a seizure.  He was in a drug house and was noted to have a seizure and woke up with right-sided hemiplegia.  He was noted to have vomited, urinary incontinence, bowel incontinence with confusion.  He was nonverbal and not following commands.  EMS was activated he is transported to [facility] evaluated by neurology and neurosurgery and was taken to the operating room for urgent left frontal temporal craniectomy evacuation hematoma with skull flap implantation. He was admitted to NSICU intubated.  SIGNIFICANT PAST MEDICAL HISTORY   Hypertension Alcohol abuse  SIGNIFICANT EVENTS:  09/08/2021 Admitted/  operating room for left frontal temporal craniectomy and evacuation hematoma 9/14 off hypertonic saline, fevers/ started on abx  9/15 ? Withdrawals?, started on precedex, cleviprex off overnight, weaned 8 hrs on SBT 5/5  9/16 decreasing Hgb, continues to wean well- mental status remains barrier  STUDIES:   CT of head 09/09/2019 >> 1. 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 2. Intraventricular extension of hemorrhage. Small focus of intraparenchymal hemorrhage in the right basal ganglia.  CT of head 09/10/2019-decreased size of left basal  ganglia hematoma with expected postoperative air.  Mild improvement of associated mass-effect.  Redistribution of intraventricular blood.  [place] 9/16 >>  1.  Stable changes post left frontoparietal craniectomy for evacuation of left-sided central hemorrhagic stroke. Residual without significant change measuring 2.8 x 6.5 cm with less air within the hemorrhage. Stable postsurgical change with mild edema/mass effect and stable midline shift to the right of 5-6 mm. 2.  Decrease in density 4 mm focus of hemorrhage over the right basal ganglia.  Renal US 9/16 >> 1. Mildly echogenic kidneys likely related to underlying medical renal disease. 2. Mild bilateral hydronephrosis.  No shadowing stone.  CULTURES:  09/09/2019 sputum>>normal flora 9/12 MRSA PCR >> neg 9/12 COVID >> neg 9/13 blood >> never collected  9/14 sputum>> normal flora  ANTIBIOTICS:  vanc 9/14>> 9/16 Pip-tazo 9/14>>  LINES/TUBES:  09/09/2019 endotracheal tube 09/09/2019 orogastric tube >> 9/14 09/09/2019 right radial A-line >> 9/15 9/14 L cortrak >>  CONSULTANTS:  09/09/2019 pulmonary critical care 09/09/2019 neurology 09/09/2019 NSGY  SUBJECTIVE:  Weaned 4 hours yesterday, r/t to tachypnea/ tachycardia/ agitated  Flexiseal placed yesterday Tmax 101.3 Net +3L  Hgb 6.9 s/p 1 unit of PRBC  Remains on precedex low dose 0.3 RN reports ongoing purposeful movements in LUE overnight  CONSTITUTIONAL: BP (!) 121/92   Pulse 76   Temp 97.9 F (36.6 C)   Resp 19   Ht 5\' 9"  (1.753 m)   Wt 64.3 kg   SpO2 98%   BMI 20.93 kg/m   I/O last 3 completed shifts: In: 2754.1 [I.V.:494.5; Blood:120; NG/GT:1700; IV Piggyback:439.6] Out: 2900 [Urine:2800; Stool:100]  Vent Mode: PRVC FiO2 (%):  [40 %] 40 % Set Rate:  [16 bmp] 16 bmp Vt Set:  [id number]  mL] 560 mL PEEP:  [5 cmH20] 5 cmH20 Pressure Support:  [5 cmH20] 5 cmH20 Plateau Pressure:  [14 cmH20-23 cmH20] 19 cmH20  PHYSICAL EXAM: General:  Critically ill young adult male in  NAD HEENT: MM pink/moist, bulky crani dressing- cdi, pupils 2/sluggish, rightward gaze, left nare cortrak, ETT 7.5 at 25 cm at lip, bite block remains in place for bitting Neuro: does not f/c, some spont movement of LUE- not localizing, withdrawals in LLE, no spont movement in RUE/RLE CV: RR, no murmur PULM:  CTA, minimal thin white secretions, breathing over set rate, becomes more tachypneic with neuro assessment GI: remains taut, crani flap in R abd-dressing dry, hyperactive BS with flexiseal with brown liquid output, condom cath with CYU Extremities: warm/dry, no edema  Skin: no rashes, several tattoos   RESOLVED PROBLEM LIST   ASSESSMENT AND PLAN   Critically ill due to ventilator dependent respiratory failure in the setting of large left intracerebral hemorrhage - continue full MV support, PRVC 8 cc/kg - Daily SBT, weaning well x 2 days, mental status remains barrier to extubation - VAP bundle -  Trend CXR as needed  -  Anticipate he will need early trach  VAP vs aspiration pneumonia vs pneumonitis.  Had vomited prior to admission. 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  Hypertonic stopped 9/14 - Keppra BID - seizure precautions - serial neuro exams - further imaging per NSGY   Hypertension w/ hx of poor compliance with medication.  Cleviprex off 9/15.  - SBP goal < 160 - ongoing amlodipine, apresoline, and labetalol - ACEi held on 9/14 given rising sCr  - prn apresoline/ labetalol   AKI -unclear baseline, suspected aspect of CKD,  worsening sCr on 9/16.  ACEi stopped 9/14.  - FeNa suggestive of pre-renal, s/p 2L NS 9/16 - UOP remains adequate - renal US as above, showing mild echogenic kidneys supporting some chronic kidney disease and mild bilateral hydronephrosis- without stone - pending am BMP/ Mag  - continue condom cath, monitor for retention - strict I/O's, renal dose meds, avoid nephrotoxic meds    Polysubstance/ ETOH abuse.  UDS negative.  Reportedly drinks Hennessey 1pt a day.  Some concern for withdrawal on 9/15 - continue precedex for ICU CIWA for RASS goal 0/-1 - daily thiamine, folate, MVI - seizure precautions  Hyperglycemia- controlled  - continue CBG q 4, SSI moderate   Anemia - Hgb trend 14.5- 10.7- 8.5 - 7.3 - 6.9 Thrombocytopenia- stable - remains hemodynamically stable on 3 htn meds  - s/p 1 unit PRBC this morning -  Repeat H/H at 1600, CBC in am  - unclear etiology, possibly due to critical illness.  CTH imaging stable 9/16.  - PPI BID  Constipation- resolved, now with flexiseal 9/16 - continue flexiseal  Best Practice / Goals of Care / Disposition.    DVT PROPHYLAXIS: SCDs, [place] heparin  GI prophylaxis: PPI BID per tube Pain agitation: RASS goal 0/-1, precedex, prn fentanyl NUTRITION: NPO/  tube feedings at goal  MOBILITY: Bedrest GOALS OF CARE: Full code Foley catheter: condom catheter FAMILY DISCUSSIONS:  per primary, girlfriend and sister (by phone) updated 9/16 at bedside DISPOSITION neuro ICU  LABS  Glucose Recent Labs  Lab 09/13/19 0742 09/13/19 1110 09/13/19 1557 09/13/19 1935 09/13/19 2328 09/14/19 0328  GLUCAP 128* 125* 111* 143* 136* 129*    BMET Recent Labs  Lab 09/12/19 0911  09/13/19 0311 09/13/19 0409 09/13/19 0850 09/13/19 1535  NA 156*   < > 152* 153* 154* 152*  K 3.6  --  3.8 3.7  --  3.7  CL 123*  --  118*  --   --  121*  CO2 24  --  23  --   --  23  BUN 23*  --  27*  --   --  35*  CREATININE 2.42*  --  2.82*  --   --  2.81*  GLUCOSE 122*   --  152*  --   --  124*   < > = values in this interval not displayed.    Liver Enzymes Recent Labs  Lab 09/09/19 0226  AST 23  ALT 17  ALKPHOS 73  BILITOT 0.4  ALBUMIN 4.1    Electrolytes Recent Labs  Lab 09/09/19 0407  09/10/19 0449  09/12/19 0911 09/13/19 0311 09/13/19 1535  CALCIUM  --    < > 8.7*   < > 8.8* 8.7* 8.6*  MG 1.9  --  2.1  --   --   --   --   PHOS 2.2*  --  1.3*  --   --   --   --    < > = values in this interval not displayed.    CBC Recent Labs  Lab 09/13/19 0311 09/13/19 0409 09/13/19 1511 09/14/19 0425  WBC 12.4*  --  12.5* 10.6*  HGB 7.3* 9.2* 7.3* 6.9*  HCT 23.0* 27.0* 22.4* 21.0*  PLT 140*  --  144* 139*    ABG Recent Labs  Lab 09/09/19 0412 09/09/19 0835 09/13/19 0409  PHART 7.443 7.418 7.464*  PCO2ART 44.4 31.1* 34.2  PO2ART 592.0* 202* 76.0*    Coags Recent Labs  Lab 09/09/19 0226  APTT 29  INR 0.9    Sepsis Markers Recent Labs  Lab 09/13/19 0850  PROCALCITON 0.76    Cardiac Enzymes No results for input(s): TROPONINI, PROBNP in the last 168 hours.   I spent 40 minutes in direct patient care including reviewing data, discussing with other providers, assessment, planning and stabilization and documentation. Time is exclusive to this patient and does not include procedures.   Kennieth Rad, MSN, AGACNP-BC [facility] Pgr: [phone] or if no answer [phone] 09/14/2019, 7:39 AM

## 2019-09-15 ENCOUNTER — Inpatient Hospital Stay (HOSPITAL_COMMUNITY): Payer: Medicaid Other

## 2019-09-15 LAB — TYPE AND SCREEN
ABO/RH(D): O POS
Antibody Screen: NEGATIVE
Unit division: 0

## 2019-09-15 LAB — BPAM RBC
Blood Product Expiration Date: 202010192359
ISSUE DATE / TIME: 202009170627
Unit Type and Rh: 5100

## 2019-09-15 LAB — BASIC METABOLIC PANEL
Anion gap: 8 (ref 5–15)
Anion gap: 6 (ref 5–15)
BUN: 38 mg/dL — ABNORMAL HIGH (ref 6–20)
BUN: 37 mg/dL — ABNORMAL HIGH (ref 6–20)
CO2: 22 mmol/L (ref 22–32)
CO2: 26 mmol/L (ref 22–32)
Calcium: 8.4 mg/dL — ABNORMAL LOW (ref 8.9–10.3)
Calcium: 8.6 mg/dL — ABNORMAL LOW (ref 8.9–10.3)
Chloride: 128 mmol/L — ABNORMAL HIGH (ref 98–111)
Chloride: 125 mmol/L — ABNORMAL HIGH (ref 98–111)
Creatinine, Ser: 2.64 mg/dL — ABNORMAL HIGH (ref 0.61–1.24)
Creatinine, Ser: 2.62 mg/dL — ABNORMAL HIGH (ref 0.61–1.24)
GFR calc Af Amer: 34 mL/min — ABNORMAL LOW (ref >60)
GFR calc Af Amer: 34 mL/min — ABNORMAL LOW (ref >60)
GFR calc non Af Amer: 29 mL/min — ABNORMAL LOW (ref >60)
GFR calc non Af Amer: 29 mL/min — ABNORMAL LOW (ref >60)
Glucose, Bld: 147 mg/dL — ABNORMAL HIGH (ref 70–99)
Glucose, Bld: 128 mg/dL — ABNORMAL HIGH (ref 70–99)
Potassium: 3.4 mmol/L — ABNORMAL LOW (ref 3.5–5.1)
Potassium: 3.9 mmol/L (ref 3.5–5.1)
Sodium: 158 mmol/L — ABNORMAL HIGH (ref 135–145)
Sodium: 157 mmol/L — ABNORMAL HIGH (ref 135–145)

## 2019-09-15 LAB — POCT I-STAT 7, (LYTES, BLD GAS, ICA,H+H)
Acid-Base Excess: 1 mmol/L (ref 0.0–2.0)
Bicarbonate: 25.8 mmol/L (ref 20.0–28.0)
Calcium, Ion: 1.3 mmol/L (ref 1.15–1.40)
HCT: 21 % — ABNORMAL LOW (ref 39.0–52.0)
Hemoglobin: 7.1 g/dL — ABNORMAL LOW (ref 13.0–17.0)
O2 Saturation: 100 %
Potassium: 4.3 mmol/L (ref 3.5–5.1)
Sodium: 161 mmol/L (ref 135–145)
TCO2: 27 mmol/L (ref 22–32)
pCO2 arterial: 41.2 mmHg (ref 32.0–48.0)
pH, Arterial: 7.404 (ref 7.350–7.450)
pO2, Arterial: 297 mmHg — ABNORMAL HIGH (ref 83.0–108.0)

## 2019-09-15 LAB — CBC
HCT: 24.7 % — ABNORMAL LOW (ref 39.0–52.0)
Hemoglobin: 7.8 g/dL — ABNORMAL LOW (ref 13.0–17.0)
MCH: 29.9 pg (ref 26.0–34.0)
MCHC: 31.6 g/dL (ref 30.0–36.0)
MCV: 94.6 fL (ref 80.0–100.0)
Platelets: 172 10*3/uL (ref 150–400)
RBC: 2.61 MIL/uL — ABNORMAL LOW (ref 4.22–5.81)
RDW: 15.7 % — ABNORMAL HIGH (ref 11.5–15.5)
WBC: 10.5 10*3/uL (ref 4.0–10.5)
nRBC: 0.7 % — ABNORMAL HIGH (ref 0.0–0.2)

## 2019-09-15 LAB — MAGNESIUM: Magnesium: 2.7 mg/dL — ABNORMAL HIGH (ref 1.7–2.4)

## 2019-09-15 LAB — PHOSPHORUS: Phosphorus: 3.6 mg/dL (ref 2.5–4.6)

## 2019-09-15 LAB — GLUCOSE, CAPILLARY
Glucose-Capillary: 143 mg/dL — ABNORMAL HIGH (ref 70–99)
Glucose-Capillary: 145 mg/dL — ABNORMAL HIGH (ref 70–99)
Glucose-Capillary: 128 mg/dL — ABNORMAL HIGH (ref 70–99)
Glucose-Capillary: 144 mg/dL — ABNORMAL HIGH (ref 70–99)
Glucose-Capillary: 114 mg/dL — ABNORMAL HIGH (ref 70–99)
Glucose-Capillary: 155 mg/dL — ABNORMAL HIGH (ref 70–99)

## 2019-09-15 MED ORDER — FENTANYL CITRATE (PF) 100 MCG/2ML IJ SOLN
200.0000 ug | Freq: Once | INTRAMUSCULAR | Status: AC
  Administered 2019-09-15: 12:00:00 200 ug via INTRAVENOUS

## 2019-09-15 MED ORDER — DEXTROSE 5 % IV SOLN
INTRAVENOUS | Status: DC
  Administered 2019-09-15 – 2019-09-16 (×2): via INTRAVENOUS

## 2019-09-15 MED ORDER — FENTANYL CITRATE (PF) 100 MCG/2ML IJ SOLN
50.0000 ug | INTRAMUSCULAR | Status: DC | PRN
  Administered 2019-09-15 – 2019-09-20 (×27): 100 ug via INTRAVENOUS
  Filled 2019-09-15 (×28): qty 2

## 2019-09-15 MED ORDER — ETOMIDATE 2 MG/ML IV SOLN
20.0000 mg | Freq: Once | INTRAVENOUS | Status: AC
  Administered 2019-09-15: 20 mg via INTRAVENOUS

## 2019-09-15 MED ORDER — MIDAZOLAM HCL 2 MG/2ML IJ SOLN
INTRAMUSCULAR | Status: AC
  Filled 2019-09-15: qty 4

## 2019-09-15 MED ORDER — POTASSIUM CHLORIDE 20 MEQ/15ML (10%) PO SOLN
40.0000 meq | Freq: Once | ORAL | Status: AC
  Administered 2019-09-15: 40 meq
  Filled 2019-09-15: qty 30

## 2019-09-15 MED ORDER — MIDAZOLAM HCL 2 MG/2ML IJ SOLN
2.0000 mg | Freq: Once | INTRAMUSCULAR | Status: AC
  Administered 2019-09-15: 2 mg via INTRAVENOUS

## 2019-09-15 MED ORDER — FREE WATER
200.0000 mL | Status: DC
  Administered 2019-09-15 – 2019-10-05 (×115): 200 mL

## 2019-09-15 MED ORDER — ROCURONIUM BROMIDE 50 MG/5ML IV SOLN
50.0000 mg | Freq: Once | INTRAVENOUS | Status: DC
  Filled 2019-09-15: qty 5

## 2019-09-15 NOTE — Procedures (Signed)
Extubation Procedure Note  Patient Details:   Name: Joshua [name] DOB: April 07, 1981 MRN: [id number]   Airway Documentation:    Vent end date: 09/15/19 Vent end time: 0910   Evaluation  O2 sats: stable throughout Complications: No apparent complications Patient did tolerate procedure well. Bilateral Breath Sounds: Clear, Diminished   Patient extubated per order to 5L [place]. Positive cuff leak was noted prior to extubation. Immediately after extubation patient became extremely agitated with increased RR, WOB, and HR. After a few minutes patient calmed down and vitals stabilized. MD was at bedside during this. Patient is now calm with stable vital signs. RT will continue to monitor.   Wendle Kina Clyda Greener 09/15/2019, 9:24 AM

## 2019-09-15 NOTE — Procedures (Signed)
Intubation Procedure Note Joshua [name] [id number] February 05, 1981  Procedure: Intubation Indications: Airway protection and maintenance  Procedure Details Consent: Risks of procedure as well as the alternatives and risks of each were explained to the (patient/caregiver).  Consent for procedure obtained. Time Out: Verified patient identification, verified procedure, site/side was marked, verified correct patient position, special equipment/implants available, medications/allergies/relevent history reviewed, required imaging and test results available.  Performed  Unable to maintain airway with repeated desaturations  Maximum sterile technique was used including cap, gloves, hand hygiene and mask.  MAC and 3  Versed 2  fent 100 mcg Etomidate 20 Rocuronium 50  Evaluation Hemodynamic Status: BP stable throughout; O2 sats: stable throughout Patient's Current Condition: stable Complications: No apparent complications Patient did tolerate procedure well. Chest X-ray ordered to verify placement.  CXR: pending.   Joshua [name] 09/15/2019

## 2019-09-15 NOTE — Progress Notes (Signed)
STROKE TEAM PROGRESS NOTE   INTERVAL HISTORY  Patient remains neurologically unchanged.  Remains sleepy with eyes closed and not following commands.  He appears to be weaning on ventilator and Dr. Elsworth Soho has plans to give trial of extubation later this morning.  Serum sodium is elevated 158 and plans are to start free water replacement OBJECTIVE Vitals:   09/15/19 0748 09/15/19 0749 09/15/19 0800 09/15/19 0920  BP:   (!) 134/98 (!) 151/102  Pulse: 68  62 84  Resp: 17  16 (!) 22  Temp:   99 F (37.2 C)   TempSrc:      SpO2: 95% 96% 96% 94%  Weight:      Height:        CBC:  Recent Labs  Lab 09/10/19 0449 09/11/19 0445  09/14/19 0425 09/14/19 1800 09/15/19 0541  WBC 24.3* 18.8*   < > 10.6*  --  10.5  NEUTROABS 21.3* 16.4*  --   --   --   --   HGB 10.7* 8.5*   < > 6.9* 7.8* 7.8*  HCT 31.3* 26.0*   < > 21.0* 24.3* 24.7*  MCV 88.2 91.2   < > 92.1  --  94.6  PLT 147* 124*   < > 139*  --  172   < > = values in this interval not displayed.    Basic Metabolic Panel:  Recent Labs  Lab 09/14/19 0853 09/15/19 0541  NA 155* 158*  K 3.6 3.4*  CL 122* 128*  CO2 23 22  GLUCOSE 129* 147*  BUN 39* 38*  CREATININE 2.80* 2.64*  CALCIUM 8.7* 8.4*  MG 2.2 2.7*  PHOS 5.1* 3.6    Lipid Panel:     Component Value Date/Time   TRIG 115 09/10/2019 0449   HgbA1c:  Lab Results  Component Value Date   HGBA1C 5.6 09/10/2019   Urine Drug Screen:     Component Value Date/Time   LABOPIA NONE DETECTED 09/09/2019 0349   COCAINSCRNUR NONE DETECTED 09/09/2019 0349   LABBENZ NONE DETECTED 09/09/2019 0349   AMPHETMU NONE DETECTED 09/09/2019 0349   THCU NONE DETECTED 09/09/2019 0349   LABBARB NONE DETECTED 09/09/2019 0349    Alcohol Level     Component Value Date/Time   ETH <10 09/09/2019 0407    IMAGING Ct Head Code Stroke Wo Contrast 09/09/2019 1. Large intraparenchymal hematoma of the left basal ganglia measuring 7.8 x 4.8 cm with 14 mm of rightward midline shift and early  subfalcine herniation.  2. Intraventricular extension of hemorrhage. Small focus of intraparenchymal hemorrhage in the right basal ganglia.   CT head  CTA Head  09/10/2019 1. Status post left pterional craniectomy for evacuation of left basal ganglia hematoma. Residual hematoma with interspersed postoperative gas measures 6.1 x 2.9 cm. 2. Decreased midline shift, now measuring 6 mm to the right. 3. Unchanged punctate focus of hemorrhage at the posterior right insula. 4. Unchanged intraventricular blood. 5. No intracranial arterial occlusion or high-grade stenosis. No aneurysm or vascular malformation.   CT head  09/13/2019 Stable changes post left frontoparietal craniectomy for evacuation of left-sided central hemorrhagic stroke. Residual without significant change measuring 2.8 x 6.5 cm with less air within the hemorrhage. Stable postsurgical change with mild edema/mass effect and stable midline shift to the right of 5-6 mm. Decrease in density 4 mm focus of hemorrhage over the right basal ganglia.  Dg Chest Port 1 View 09/15/2019 1.  Lines and tubes in good anatomic position. 2.  Progressive  right lower lobe atelectasis/infiltrate. 09/10/2019 Resolving right midlung atelectasis. Tubes and lines as described. 09/09/2019 No acute cardiopulmonary abnormality. Appropriate positioning of the endotracheal and transesophageal tubes.   Renal US 09/13/2019 1. Mildly echogenic kidneys likely related to underlying medical renal disease. 2. Mild bilateral hydronephrosis.  No shadowing stone.  ECG - SR rate 97 BPM. LVH and prolonged QT interval. (See cardiology reading for complete details)   PHYSICAL EXAM     [name] who is sedated intubated.  Not in distress. . Afebrile. Head is nontraumatic. Neck is supple without bruit.    Cardiac exam no murmur or gallop. Lungs are clear to auscultation. Distal pulses are well felt. Neurological Exam  Patient is sedated intubated   Eyes are  closed.  He does not follow any commands.  Pupils 2 mm sluggishly reactive.  Left gaze deviation doll's eye movements are absent.  Corneal reflexes are sluggish.  Fundi not visualized.  Face is symmetric.  Tongue midline.   semipurposeful localization on the left.  Trace flexion in the right lower extremity to pain.  No movement in the right upper extremity.  Plantars are downgoing.   ASSESSMENT/PLAN Joshua [name] is a 38 y.o. male with history of admission for Etoh poisoning presenting with a seizure, vomiting.   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   NSG - Vertell Limber - decompressive craniectomy w/ flap in R abd - 09/09/19 Dr Vertell Limber  Code Stroke CT Head - Large intraparenchymal hematoma of the left basal ganglia measuring 7.8 x 4.8 cm with 14 mm of rightward midline shift and early subfalcine herniation. Intraventricular extension of hemorrhage. Small focus of intraparenchymal hemorrhage in the right basal ganglia.   CT/CTA head - 09/10/2019 - Status post left pterional craniectomy for evacuation of left basal ganglia hematoma. Residual hematoma with interspersed postoperative gas measures 6.1 x 2.9 cm. Decreased midline shift, now measuring 6 mm to the right. No intracranial arterial occlusion or high-grade stenosis. No aneurysm or vascular malformation  Ct head 9/16 stable crani. Stable stroke.5-6 midline shift. Decreased R basal ganglia hemorrhage   HgbA1c 5.6  [employer] Virus 2 - negative  UDS - negative  VTE prophylaxis - Heparin 5000 units sq tid   No antithrombotic prior to admission, now on No antithrombotic d/t hemorrhage   Therapy recommendations:  pending  Disposition:  Pending  Cerebral Edema Comatose state  S/p crani  Treated with 3% saline protocol, now off  Na 158   New Free water 200 q4h  Respiratory Failure  Secondary to [place]  Off sedation  and extubated this am  CCM on board  VAP vs Aspiration PNA (vomited PTA) Leukocytosis, Febrile  WBC 10.5  T Max 101.7   U/A neg 9/14  Blood cultures normal flora  CXR progressive RLL atx/infilatrate  D/c Vanc, d/c Zosyn -> unasyn  Hypertensive Emergency  Probable undiagnosed / untreated hypertension (severe LVH on ECG)  Home BP meds: none  Current BP meds: amlodipine 10, hydralazine 50 tid, labetolol 200 tid    Off lisinopril D/t increasing Cr  SBP goal < 160 mm Hg     Off cleviprex    Seizure  On presentation  Hx 1 seizure previously  on Keppra 500 mg IV Bid  Hyperlipidemia  Home Lipid lowering medication: none  LDL - 115  Current lipid lowering medication: None  Consider  statin hyponatremia.  Discharge  Dysphagia Malnutrition . Secondary to stroke . NPO  Has cortrak and tube feeding  Hyperglycemia  HgbA1c 5.6  CBGs  SSI  Anemia  Thrombocytopenia- stable  Hgb 14.5- 10.7- 8.5 - 7.3 - 6.9 - 1 unit - 7.8  unclear etiology  on PPI BID  Other Stroke Risk Factors  Hx ETOH abuse, recent hospitalization for alcohol poisoning. Drinks 1 pt Hennessey per day. Tachycardia w/ tremors, temp. Placed on CIWA protocol.  Polysubstance abuse  Other Active Problems  Hypokalemia 3.4 - replaced  AKI, ? CKD. ACE stopped 9/14. Renal US mild echogenic kidneys. Creatinine - 2.64  Mild Thrombocytopenia - 172  Phos 3.6  Hospital day # 6 Agree with plans for trial of extubation but patient does not do well will need reintubation and tracheostomy early next week.  Agree with starting free water replacement to correct hypernatremia.[name] no family available at the bedside for discussion. Discussed with Dr. Lenoard Aden mother and answered questions This patient is critically ill and at significant risk of neurological worsening, death and care requires constant monitoring of vital signs, hemodynamics,respiratory and cardiac monitoring, extensive review of  multiple databases, frequent neurological assessment, discussion with family, other specialists and medical decision making of high complexity.I have made any additions or clarifications directly to the above note.This critical care time does not reflect procedure time, or teaching time or supervisory time of PA/NP/Med Resident etc but could involve care discussion time.  I spent 30 minutes of neurocritical care time  in the care of  this patient.  Joshua Contras, MD Medical Director [facility] Stroke Center Pager: [phone] 09/15/2019 9:45 AM  To contact Stroke Continuity provider, please refer to [email or web address] After hours, contact General Neurology

## 2019-09-15 NOTE — Progress Notes (Addendum)
PULMONARY / CRITICAL CARE MEDICINE   NAME:  Joshua [name], MRN:  [id number], DOB:  [birth year], LOS: 6 ADMISSION DATE:  09/09/2019, CONSULTATION DATE: 09/09/2019 REFERRING MD: Neurosurgery, CHIEF COMPLAINT: Vent dependent respiratory failure secondary to intracranial hemorrhage BRIEF HISTORY:    38 year old history of alcohol abuse seizures noncompliant hypertension who presented with new seizure right-sided hemiplegia confusion was found to have a large left intracranial hemorrhage for which he underwent craniectomy.  HISTORY OF PRESENT ILLNESS   38 year old male with a history of alcohol abuse to the point of alcohol poisoning resulting in a seizure.  He was in a drug house and was noted to have a seizure and woke up with right-sided hemiplegia.  He was noted to have vomited, urinary incontinence, bowel incontinence with confusion.  He was nonverbal and not following commands.  EMS was activated he is transported to [facility] evaluated by neurology and neurosurgery and was taken to the operating room for urgent left frontal temporal craniectomy evacuation hematoma with skull flap implantation. He was admitted to NSICU intubated.  SIGNIFICANT PAST MEDICAL HISTORY   Hypertension Alcohol abuse  SIGNIFICANT EVENTS:  09/08/2021 Admitted/  operating room for left frontal temporal craniectomy and evacuation hematoma 9/14 off hypertonic saline, fevers/ started on abx  9/15 ? Withdrawals?, started on precedex, cleviprex off overnight, weaned 8 hrs on SBT 5/5  9/16 decreasing Hgb, continues to wean well- mental status remains barrier  STUDIES:   CT of head 09/09/2019 >> 1. 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 2. Intraventricular extension of hemorrhage. Small focus of intraparenchymal hemorrhage in the right basal ganglia.  CT of head 09/10/2019-decreased size of left basal  ganglia hematoma with expected postoperative air.  Mild improvement of associated mass-effect.  Redistribution of intraventricular blood.  [place] 9/16 >>  1.  Stable changes post left frontoparietal craniectomy for evacuation of left-sided central hemorrhagic stroke. Residual without significant change measuring 2.8 x 6.5 cm with less air within the hemorrhage. Stable postsurgical change with mild edema/mass effect and stable midline shift to the right of 5-6 mm. 2.  Decrease in density 4 mm focus of hemorrhage over the right basal ganglia.  Renal US 9/16 >> 1. Mildly echogenic kidneys likely related to underlying medical renal disease. 2. Mild bilateral hydronephrosis.  No shadowing stone.  CULTURES:  09/09/2019 sputum>>normal flora 9/12 MRSA PCR >> neg 9/12 COVID >> neg 9/13 blood >> never collected  9/14 sputum>> normal flora  ANTIBIOTICS:  vanc 9/14>> 9/16 Pip-tazo 9/14>>9/17 Unasyn 9/17 >>  LINES/TUBES:  09/09/2019 endotracheal tube 09/09/2019 orogastric tube >> 9/14 09/09/2019 right radial A-line >> 9/15 9/14 L cortrak >>  CONSULTANTS:  09/09/2019 pulmonary critical care 09/09/2019 neurology 09/09/2019 NSGY  SUBJECTIVE:  Weaned 8 hours yesterday  Tmax 101.7 H/H stable Slightly improved sCr, UOP adequate precedex remains low dose 0.7 overnight, periods of agitation and then once left alone settles down  CONSTITUTIONAL: BP 130/84   Pulse 68   Temp 98.1 F (36.7 C)   Resp 17   Ht 5\' 9"  (1.753 m)   Wt 67 kg   SpO2 96%   BMI 21.81 kg/m   I/O last 3 completed shifts: In: 2364.1 [I.V.:380.9; Blood:120; NG/GT:1480; IV Piggyback:383.2] Out: 4900 [Urine:4650; Stool:250]  Vent Mode: PSV;CPAP FiO2 (%):  [30 %-40 %] 30 % Set Rate:  [16 bmp] 16 bmp Vt Set:  [560 mL] 560 mL PEEP:  [5 cmH20]  5 cmH20 Pressure Support:  [5 cmH20] 5 cmH20 Plateau Pressure:  [16 [id number] cmH20] 16 cmH20  PHYSICAL EXAM: General:  Young adult male in NAD HEENT: MM pink/moist, pupils 2 b/l,  anicteric, left nare cortrak, bulky crani dressing dry  Neuro: does not open eyes or f/c, moves LUE/LLL purposefully, some spastic movement RUE, flaccid RLL CV: rr, no murmur PULM:  CTA GI: remains taut, hyperBS, condom cath- cyu, flexiseal w/brown stool Extremities: warm/dry, no LE edema  Skin: no rashes, several tattoos  RESOLVED PROBLEM LIST   ASSESSMENT AND PLAN   Critically ill due to ventilator dependent respiratory failure in the setting of large left intracerebral hemorrhage P:  Full MV support, PRVC 8 cc/kg Doing well on daily SBTs, mental status has been barrier to extubation, will trial extubation today to see how patient will do prior to committing him to trach.  Will be high risk for aspiration/ re-intubation Trend CXR prn  VAP vs aspiration pneumonia vs pneumonitis.  Had vomited prior to admission P:  Abx started 9/14, deescalated to Unasyn 9/17, continue for total 5 days therapy Trend WBC/ fever curve   Fevers - suspected to be related to [place] as cultures this far have been negative and ongoing improvement in secretions/ minimal vent requirements - resolved leukocytosis  P:  Trend WBC/ fever curve  Tylenol prn  Continued Unasyn as above   Large left basal ganglia intracerebral hemorrhage with rightward shift and early subfalcine herniation, s/p left decompressive craniectomy with evacuation of hematoma with skull flap implantation right abdomen- most likely related to hx of poorly controlled HTN Cerebral edema  Seizure  P: Per Neurology/ NSGY  Continue Keppra BID with seizure precautions Serial neuro exams Allowing natural down trending of Na since hypertonic saline stopped 9/14  Hypertension w/ hx of poor compliance with medication P:  SBP goal remains < 160 Well controlled on ongoing amlodipine, apresoline, and labetalol No ACEi given AKI Prn apresoline/ labetalol  AKI -unclear baseline sCr, suspected CKD Hypernatremia- initially iatrogenic with  hypertonic saline Mild hypokalemia Mild hyperchloremic acidosis - worsening sCr 9/16.  ACEi stopped 9/14.  - FeNa suggestive prerenal, renal US c/w CKD, mild hydroneprhosis without stone P:  Free water deficit 4580 ml, start free water, 200 ml q 4 Slighty improved sCr today, UOP remains great  KCL 40 meq x 1 Continue condom cath, monitoring for urinary retention Trending BMP/ strict I/Os  Polysubstance/ ETOH abuse - UDS negative.   - Reportedly drinks Hennessey 6 shots to 1pt a day - concern for withdrawal on 9/15 P:  Remains on low dose precedex for RASS goal 0/-1 Daily thiamine, MVI, folate   Hyperglycemia P:  Controlled Continue CBG q 4/ SSI mod  Anemia -?thought to be related to critical illness Thrombocytopenia- improving  -remains hemodynamically stable - s/p 1 unit PRBC 9/17 P:  H/H trend stable, continue to trend  Transfuse for Hgb <7 No evidence of bleeding  Continue PPI BID   Constipation- resolved, now with flexiseal 9/16 P:  Continue flexiseal   Best Practice / Goals of Care / Disposition.    DVT PROPHYLAXIS: SCDs, [place] heparin  GI prophylaxis: PPI BID per tube Pain agitation: RASS goal 0/-1, precedex, prn fentanyl NUTRITION: NPO/  tube feedings at goal  MOBILITY: Bedrest GOALS OF CARE: Full code Foley catheter: condom catheter FAMILY DISCUSSIONS: pending  DISPOSITION neuro ICU  LABS  Glucose Recent Labs  Lab 09/14/19 1242 09/14/19 1729 09/14/19 1928 09/14/19 2323 09/15/19 0323 09/15/19 0809  GLUCAP  116* 135* 124* 132* 143* 145*    BMET Recent Labs  Lab 09/13/19 1535 09/14/19 0853 09/15/19 0541  NA 152* 155* 158*  K 3.7 3.6 3.4*  CL 121* 122* 128*  CO2 23 23 22   BUN 35* 39* 38*  CREATININE 2.81* 2.80* 2.64*  GLUCOSE 124* 129* 147*    Liver Enzymes Recent Labs  Lab 09/09/19 0226 09/14/19 0853  AST 23  --   ALT 17  --   ALKPHOS 73  --   BILITOT 0.4  --   ALBUMIN 4.1 2.1*    Electrolytes Recent Labs  Lab  09/10/19 0449  09/13/19 1535 09/14/19 0853 09/15/19 0541  CALCIUM 8.7*   < > 8.6* 8.7* 8.4*  MG 2.1  --   --  2.2 2.7*  PHOS 1.3*  --   --  5.1* 3.6   < > = values in this interval not displayed.    CBC Recent Labs  Lab 09/13/19 1511 09/14/19 0425 09/14/19 1800 09/15/19 0541  WBC 12.5* 10.6*  --  10.5  HGB 7.3* 6.9* 7.8* 7.8*  HCT 22.4* 21.0* 24.3* 24.7*  PLT 144* 139*  --  172    ABG Recent Labs  Lab 09/09/19 0412 09/09/19 0835 09/13/19 0409  PHART 7.443 7.418 7.464*  PCO2ART 44.4 31.1* 34.2  PO2ART 592.0* 202* 76.0*    Coags Recent Labs  Lab 09/09/19 0226  APTT 29  INR 0.9    Sepsis Markers Recent Labs  Lab 09/13/19 0850  PROCALCITON 0.76    Cardiac Enzymes No results for input(s): TROPONINI, PROBNP in the last 168 hours.   I spent 35 minutes in direct patient care including reviewing data, discussing with other providers, assessment, planning and stabilization and documentation. Time is exclusive to this patient and does not include procedures.   Kennieth Rad, MSN, AGACNP-BC [place] [facility] Pgr: [phone] or if no answer [phone] 09/15/2019, 8:49 AM

## 2019-09-15 NOTE — Progress Notes (Signed)
Pt extubated at 0910. Pt off and on severely agitated, hypertensive and tachypneic refractory to antihypertensives and precedex. Decision made by Dr Elsworth Soho to reintubate at 1130.

## 2019-09-15 NOTE — Progress Notes (Signed)
Sodium 161 in ABG. MD notified, orders received.

## 2019-09-15 NOTE — Progress Notes (Addendum)
Subjective: Patient reports (nonverbal)  Objective: Vital signs in last 24 hours: Temp:  [98.1 F (36.7 C)-101.7 F (38.7 C)] 99 F (37.2 C) (09/18 0800) Pulse Rate:  [56-133] 84 (09/18 0920) Resp:  [16-35] 22 (09/18 0920) BP: (130-176)/(78-124) 151/102 (09/18 0920) SpO2:  [94 %-100 %] 94 % (09/18 0920) FiO2 (%):  [30 %-40 %] 40 % (09/18 0920) Weight:  [67 kg] 67 kg (09/18 0500)  Intake/Output from previous day: 09/17 0701 - 09/18 0700 In: 1505.7 [I.V.:225.9; NG/GT:980; IV Piggyback:299.7] Out: 3250 [Urine:3100; Stool:150] Intake/Output this shift: No intake/output data recorded.  Now off vent. Moving left side. Withdraws from noxious stim. Does not follow commands. Scalp drsg clean and dry. Abdominal incision without erythema, swelling or drainage beneath honeycomb drsg.   Lab Results: Recent Labs    09/14/19 0425 09/14/19 1800 09/15/19 0541  WBC 10.6*  --  10.5  HGB 6.9* 7.8* 7.8*  HCT 21.0* 24.3* 24.7*  PLT 139*  --  172   BMET Recent Labs    09/14/19 0853 09/15/19 0541  NA 155* 158*  K 3.6 3.4*  CL 122* 128*  CO2 23 22  GLUCOSE 129* 147*  BUN 39* 38*  CREATININE 2.80* 2.64*  CALCIUM 8.7* 8.4*    Studies/Results: US Renal  Result Date: 09/13/2019 CLINICAL DATA:  38 year old male with acute renal insufficiency. EXAM: RENAL / URINARY TRACT ULTRASOUND COMPLETE COMPARISON:  None. FINDINGS: Evaluation is somewhat limited due to inability of the patient to cooperate with the exam. Right Kidney: Renal measurements: 10.9 x 4.9 x 6.4 cm = volume: 179 mL. There is increased renal parenchymal echogenicity. There is mild hydronephrosis of the right kidney. No shadowing stone. Left Kidney: Renal measurements: 9.8 x 4.8 x 5.9 cm = volume: 145 mL. There is increased renal parenchymal echogenicity. Mild hydronephrosis. No shadowing stone. Bladder: The urinary bladder is distended otherwise unremarkable. Ureteral jets are not visualized. IMPRESSION: 1. Mildly echogenic kidneys  likely related to underlying medical renal disease. 2. Mild bilateral hydronephrosis.  No shadowing stone. Electronically Signed   By: Anner Crete M.D.   On: 09/13/2019 11:41   Dg Chest Port 1 View  Result Date: 09/15/2019 CLINICAL DATA:  Respiratory failure. EXAM: PORTABLE CHEST 1 VIEW COMPARISON:  Chest x-ray 09/10/2019. FINDINGS: Endotracheal tube noted with tip 3 cm above the carina. Feeding tube noted with tip below left hemidiaphragm. Left PICC line noted with tip at cavoatrial junction. Stable cardiomegaly. Progressive right lower lobe atelectasis/infiltrate. No pleural effusion or pneumothorax. IMPRESSION: 1.  Lines and tubes in good anatomic position. 2.  Progressive right lower lobe atelectasis/infiltrate. Electronically Signed   By: Marcello Moores  Register   On: 09/15/2019 08:14    Assessment/Plan:   LOS: 6 days  supportive care continues   [name] 09/15/2019, 10:06 AM  Continue supportive care.  Appreciate stroke and CCM input.

## 2019-09-16 ENCOUNTER — Inpatient Hospital Stay (HOSPITAL_COMMUNITY): Payer: Medicaid Other

## 2019-09-16 DIAGNOSIS — D72829 Elevated white blood cell count, unspecified: Secondary | ICD-10-CM

## 2019-09-16 DIAGNOSIS — D649 Anemia, unspecified: Secondary | ICD-10-CM

## 2019-09-16 DIAGNOSIS — F1010 Alcohol abuse, uncomplicated: Secondary | ICD-10-CM

## 2019-09-16 DIAGNOSIS — I340 Nonrheumatic mitral (valve) insufficiency: Secondary | ICD-10-CM

## 2019-09-16 DIAGNOSIS — R509 Fever, unspecified: Secondary | ICD-10-CM

## 2019-09-16 LAB — GLUCOSE, CAPILLARY
Glucose-Capillary: 133 mg/dL — ABNORMAL HIGH (ref 70–99)
Glucose-Capillary: 141 mg/dL — ABNORMAL HIGH (ref 70–99)
Glucose-Capillary: 155 mg/dL — ABNORMAL HIGH (ref 70–99)
Glucose-Capillary: 115 mg/dL — ABNORMAL HIGH (ref 70–99)
Glucose-Capillary: 118 mg/dL — ABNORMAL HIGH (ref 70–99)
Glucose-Capillary: 152 mg/dL — ABNORMAL HIGH (ref 70–99)

## 2019-09-16 LAB — ECHOCARDIOGRAM COMPLETE
Height: 69 in
Weight: 2257.51 oz

## 2019-09-16 LAB — BASIC METABOLIC PANEL
Anion gap: 9 (ref 5–15)
BUN: 37 mg/dL — ABNORMAL HIGH (ref 6–20)
CO2: 24 mmol/L (ref 22–32)
Calcium: 8.7 mg/dL — ABNORMAL LOW (ref 8.9–10.3)
Chloride: 123 mmol/L — ABNORMAL HIGH (ref 98–111)
Creatinine, Ser: 2.44 mg/dL — ABNORMAL HIGH (ref 0.61–1.24)
GFR calc Af Amer: 37 mL/min — ABNORMAL LOW (ref >60)
GFR calc non Af Amer: 32 mL/min — ABNORMAL LOW (ref >60)
Glucose, Bld: 172 mg/dL — ABNORMAL HIGH (ref 70–99)
Potassium: 3.8 mmol/L (ref 3.5–5.1)
Sodium: 156 mmol/L — ABNORMAL HIGH (ref 135–145)

## 2019-09-16 LAB — LIPID PANEL
Cholesterol: 134 mg/dL (ref 0–200)
HDL: 28 mg/dL — ABNORMAL LOW (ref >40)
LDL Cholesterol: 86 mg/dL (ref 0–99)
Total CHOL/HDL Ratio: 4.8 RATIO
Triglycerides: 101 mg/dL (ref <150)
VLDL: 20 mg/dL (ref 0–40)

## 2019-09-16 LAB — CBC
HCT: 24.6 % — ABNORMAL LOW (ref 39.0–52.0)
Hemoglobin: 7.6 g/dL — ABNORMAL LOW (ref 13.0–17.0)
MCH: 29.7 pg (ref 26.0–34.0)
MCHC: 30.9 g/dL (ref 30.0–36.0)
MCV: 96.1 fL (ref 80.0–100.0)
Platelets: 201 10*3/uL (ref 150–400)
RBC: 2.56 MIL/uL — ABNORMAL LOW (ref 4.22–5.81)
RDW: 15.9 % — ABNORMAL HIGH (ref 11.5–15.5)
WBC: 10.2 10*3/uL (ref 4.0–10.5)
nRBC: 1.1 % — ABNORMAL HIGH (ref 0.0–0.2)

## 2019-09-16 LAB — PHOSPHORUS: Phosphorus: 3.8 mg/dL (ref 2.5–4.6)

## 2019-09-16 LAB — MAGNESIUM: Magnesium: 2.6 mg/dL — ABNORMAL HIGH (ref 1.7–2.4)

## 2019-09-16 MED ORDER — DEXMEDETOMIDINE HCL IN NACL 200 MCG/50ML IV SOLN
0.0000 ug/kg/h | INTRAVENOUS | Status: DC
  Administered 2019-09-16: 0.9 ug/kg/h via INTRAVENOUS
  Administered 2019-09-16: 1.1 ug/kg/h via INTRAVENOUS
  Administered 2019-09-16: 12:00:00 0.7 ug/kg/h via INTRAVENOUS
  Administered 2019-09-16: 1.1 ug/kg/h via INTRAVENOUS
  Filled 2019-09-16 (×4): qty 50

## 2019-09-16 MED ORDER — MIDAZOLAM HCL 2 MG/2ML IJ SOLN
2.0000 mg | INTRAMUSCULAR | Status: DC | PRN
  Administered 2019-09-16 – 2019-09-19 (×13): 2 mg via INTRAVENOUS
  Filled 2019-09-16 (×13): qty 2

## 2019-09-16 MED ORDER — DEXMEDETOMIDINE HCL IN NACL 400 MCG/100ML IV SOLN
0.0000 ug/kg/h | INTRAVENOUS | Status: DC
  Administered 2019-09-16 – 2019-09-17 (×2): 1.1 ug/kg/h via INTRAVENOUS
  Administered 2019-09-17 (×3): 0.9 ug/kg/h via INTRAVENOUS
  Administered 2019-09-18 (×3): 1.2 ug/kg/h via INTRAVENOUS
  Administered 2019-09-18 – 2019-09-19 (×2): 1 ug/kg/h via INTRAVENOUS
  Administered 2019-09-19 – 2019-09-20 (×4): 1.2 ug/kg/h via INTRAVENOUS
  Administered 2019-09-20: 0.5 ug/kg/h via INTRAVENOUS
  Administered 2019-09-20: 1.4 ug/kg/h via INTRAVENOUS
  Administered 2019-09-21 (×2): 1.2 ug/kg/h via INTRAVENOUS
  Administered 2019-09-21: 1 ug/kg/h via INTRAVENOUS
  Administered 2019-09-21 (×2): 1.2 ug/kg/h via INTRAVENOUS
  Administered 2019-09-22: 1 ug/kg/h via INTRAVENOUS
  Filled 2019-09-16 (×22): qty 100

## 2019-09-16 NOTE — Progress Notes (Signed)
Neurosurgery Service Progress Note  Subjective: No acute events overnight   Objective: Vitals:   09/16/19 0500 09/16/19 0600 09/16/19 0700 09/16/19 0800  BP: (!) 127/95 (!) 147/118 119/84 (!) 144/90  Pulse: 62 87 64 80  Resp: 16 (!) _0 Temp: 98.8 F (37.1 C) 99 F (37.2 C) 98.6 F (37 C) 97.7 F (36.5 C)  TempSrc:    Core (Comment)  SpO2: 99% 100% 100% 99%  Weight: 64 kg     Height:       Temp (24hrs), Avg:98.8 F (37.1 C), Min:97.5 F (36.4 C), Max:99.9 F (37.7 C)  CBC Latest Ref Rng & Units 09/16/2019 09/15/2019 09/15/2019  WBC 4.0 - 10.5 K/uL 10.2 - 10.5  Hemoglobin 13.0 - 17.0 g/dL 7.6(L) 7.1(L) 7.8(L)  Hematocrit 39.0 - 52.0 % 24.6(L) 21.0(L) 24.7(L)  Platelets 150 - 400 K/uL 201 - 172   BMP Latest Ref Rng & Units 09/16/2019 09/15/2019 09/15/2019  Glucose 70 - 99 mg/dL 172(H) 128(H) -  BUN 6 - 20 mg/dL 37(H) 37(H) -  Creatinine 0.61 - 1.24 mg/dL 2.44(H) 2.62(H) -  Sodium 135 - 145 mmol/L 156(H) 157(H) 161(HH)  Potassium 3.5 - 5.1 mmol/L 3.8 3.9 4.3  Chloride 98 - 111 mmol/L 123(H) 125(H) -  CO2 22 - 32 mmol/L 24 26 -  Calcium 8.9 - 10.3 mg/dL 8.7(L) 8.6(L) -    Intake/Output Summary (Last 24 hours) at 09/16/2019 0821 Last data filed at 09/16/2019 0800 Gross per 24 hour  Intake 2565.13 ml  Output 2100 ml  Net 465.13 ml    Current Facility-Administered Medications:  .  acetaminophen (TYLENOL) tablet 650 mg, 650 mg, Per Tube, Q4H PRN, 650 mg at 09/15/19 1018 **OR** acetaminophen (TYLENOL) suppository 650 mg, 650 mg, Rectal, Q4H PRN, Leonie Man, Pramod S, MD .  amLODipine (NORVASC) tablet 10 mg, 10 mg, Per Tube, Daily, Agarwala, Ravi, MD, 10 mg at 09/15/19 1152 .  Ampicillin-Sulbactam (UNASYN) 3 g in sodium chloride 0.9 % 100 mL IVPB, 3 g, Intravenous, Q8H, KoAltha Harm, RPH, Stopped at 09/16/19 0313 .  bisacodyl (DULCOLAX) suppository 10 mg, 10 mg, Rectal, Daily PRN, Erline Levine, MD .  chlorhexidine gluconate (MEDLINE KIT) (PERIDEX) 0.12 % solution 15 mL, 15  mL, Mouth Rinse, BID, Erline Levine, MD, 15 mL at 09/16/19 0813 .  Chlorhexidine Gluconate Cloth 2 % PADS 6 each, 6 each, Topical, Daily, Anders Simmonds, MD, 6 each at 09/15/19 2300 .  dexmedetomidine (PRECEDEX) 200 MCG/50ML (4 mcg/mL) infusion, 0-1.2 mcg/kg/hr, Intravenous, Continuous, Sood, Vineet, MD .  feeding supplement (OSMOLITE 1.5 CAL) liquid 1,000 mL, 1,000 mL, Per Tube, Continuous, Rigoberto Noel, MD, Last Rate: 55 mL/hr at 09/15/19 1500 .  feeding supplement (PRO-STAT SUGAR FREE 64) liquid 30 mL, 30 mL, Per Tube, BID, Rigoberto Noel, MD, 30 mL at 09/15/19 2300 .  fentaNYL (SUBLIMAZE) injection 50-100 mcg, 50-100 mcg, Intravenous, Q1H PRN, Rigoberto Noel, MD, 100 mcg at 09/16/19 0630 .  folic acid (FOLVITE) tablet 1 mg, 1 mg, Per Tube, Daily, Noemi Chapel P, DO, 1 mg at 09/15/19 1000 .  free water 200 mL, 200 mL, Per Tube, Q4H, Kara Mead V, MD, 200 mL at 09/16/19 0551 .  heparin injection 5,000 Units, 5,000 Units, Subcutaneous, Q8H, Julian Hy, DO, 5,000 Units at 09/16/19 0551 .  hydrALAZINE (APRESOLINE) injection 20 mg, 20 mg, Intravenous, Q6H PRN, Garvin Fila, MD, 20 mg at 09/15/19 1015 .  hydrALAZINE (APRESOLINE) tablet 50 mg, 50 mg, Per Tube, Q8H, Clark,  Mickel Baas P, DO, 50 mg at 09/16/19 0550 .  insulin aspart (novoLOG) injection 0-15 Units, 0-15 Units, Subcutaneous, Q4H, Elsie Lincoln, MD, 2 Units at 09/16/19 0812 .  labetalol (NORMODYNE) injection 10-40 mg, 10-40 mg, Intravenous, Q10 min PRN, Garvin Fila, MD, 20 mg at 09/15/19 0234 .  labetalol (NORMODYNE) tablet 200 mg, 200 mg, Per Tube, TID, Garvin Fila, MD, 200 mg at 09/15/19 2300 .  levETIRAcetam (KEPPRA) 100 MG/ML solution 500 mg, 500 mg, Per Tube, BID, Agarwala, Ravi, MD, 500 mg at 09/15/19 2300 .  MEDLINE mouth rinse, 15 mL, Mouth Rinse, 10 times per day, Erline Levine, MD, 15 mL at 09/16/19 0551 .  midazolam (VERSED) injection 2 mg, 2 mg, Intravenous, Q2H PRN, Chesley Mires, MD .  multivitamin liquid 15 mL, 15  mL, Per Tube, Daily, Clark, Laura P, DO, 15 mL at 09/15/19 1000 .  [DISCONTINUED] ondansetron (ZOFRAN) tablet 4 mg, 4 mg, Oral, Q4H PRN **OR** ondansetron (ZOFRAN) injection 4 mg, 4 mg, Intravenous, Q4H PRN, Erline Levine, MD .  pantoprazole sodium (PROTONIX) 40 mg/20 mL oral suspension 40 mg, 40 mg, Per Tube, BID, Jennelle Human B, NP, 40 mg at 09/15/19 2300 .  polyethylene glycol (MIRALAX / GLYCOLAX) packet 17 g, 17 g, Oral, Daily PRN, Erline Levine, MD .  promethazine (PHENERGAN) tablet 12.5-25 mg, 12.5-25 mg, Oral, Q4H PRN, Erline Levine, MD .  sodium chloride flush (NS) 0.9 % injection 10-40 mL, 10-40 mL, Intracatheter, Q12H, Erline Levine, MD, 10 mL at 09/15/19 2300 .  sodium chloride flush (NS) 0.9 % injection 10-40 mL, 10-40 mL, Intracatheter, PRN, Erline Levine, MD .  thiamine (VITAMIN B-1) tablet 100 mg, 100 mg, Per Tube, Daily, Julian Hy, DO, 100 mg at 09/15/19 1000   Physical Exam: Intubated, w/d on L, 0/5 on R Head wrap in place  Assessment & Plan: 38 y.o. 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  -no change in neurosurgical plan of care  Judith Part  09/16/19 8:21 AM

## 2019-09-16 NOTE — Progress Notes (Addendum)
STROKE TEAM PROGRESS NOTE   INTERVAL HISTORY Patient re-intubated yesterday, open eyes on pain but not tracking and not following commands.  Left side spontaneous movement, RUE localize to pain.  Serum sodium 156, afebrile, Hb and BP improved. On unasyn and free water.   OBJECTIVE Vitals:   09/16/19 0830 09/16/19 0900 09/16/19 1000 09/16/19 1025  BP:  (!) 137/96 (!) 159/122 (!) 165/121  Pulse:  63 64 98  Resp:  17 16 (!) 24  Temp:  97.9 F (36.6 C) 97.7 F (36.5 C) 97.7 F (36.5 C)  TempSrc:      SpO2: (!) 11% 100% 99% 98%  Weight:      Height:        CBC:  Recent Labs  Lab 09/10/19 0449 09/11/19 0445  09/15/19 0541 09/15/19 1250 09/16/19 0613  WBC 24.3* 18.8*   < > 10.5  --  10.2  NEUTROABS 21.3* 16.4*  --   --   --   --   HGB 10.7* 8.5*   < > 7.8* 7.1* 7.6*  HCT 31.3* 26.0*   < > 24.7* 21.0* 24.6*  MCV 88.2 91.2   < > 94.6  --  96.1  PLT 147* 124*   < > 172  --  201   < > = values in this interval not displayed.    Basic Metabolic Panel:  Recent Labs  Lab 09/15/19 0541  09/15/19 1931 09/16/19 0613  NA 158*   < > 157* 156*  K 3.4*   < > 3.9 3.8  CL 128*  --  125* 123*  CO2 22  --  26 24  GLUCOSE 147*  --  128* 172*  BUN 38*  --  37* 37*  CREATININE 2.64*  --  2.62* 2.44*  CALCIUM 8.4*  --  8.6* 8.7*  MG 2.7*  --   --  2.6*  PHOS 3.6  --   --  3.8   < > = values in this interval not displayed.    Lipid Panel:     Component Value Date/Time   CHOL 134 09/16/2019 0613   TRIG 101 09/16/2019 0613   HDL 28 (L) 09/16/2019 0613   CHOLHDL 4.8 09/16/2019 0613   VLDL 20 09/16/2019 0613   LDLCALC 86 09/16/2019 0613   HgbA1c:  Lab Results  Component Value Date   HGBA1C 5.6 09/10/2019   Urine Drug Screen:     Component Value Date/Time   LABOPIA NONE DETECTED 09/09/2019 0349   COCAINSCRNUR NONE DETECTED 09/09/2019 0349   LABBENZ NONE DETECTED 09/09/2019 0349   AMPHETMU NONE DETECTED 09/09/2019 0349   THCU NONE DETECTED 09/09/2019 0349   LABBARB NONE  DETECTED 09/09/2019 0349    Alcohol Level     Component Value Date/Time   ETH <10 09/09/2019 0407    IMAGING  Ct Head Code Stroke Wo Contrast 09/09/2019 1. 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  2. Intraventricular extension of hemorrhage. Small focus of intraparenchymal hemorrhage in the right basal ganglia.   CT head  CTA Head  09/10/2019 1. Status post left pterional craniectomy for evacuation of left basal ganglia hematoma. Residual hematoma with interspersed postoperative gas measures 6.1 x 2.9 cm. 2. Decreased midline shift, now measuring 6 mm to the right. 3. Unchanged punctate focus of hemorrhage at the posterior right insula. 4. Unchanged intraventricular blood. 5. No intracranial arterial occlusion or high-grade  stenosis. No aneurysm or vascular malformation.   CT head  09/13/2019 Stable changes post left frontoparietal craniectomy for evacuation of left-sided central hemorrhagic stroke. Residual without significant change measuring 2.8 x 6.5 cm with less air within the hemorrhage. Stable postsurgical change with mild edema/mass effect and stable midline shift to the right of 5-6 mm. Decrease in density 4 mm focus of hemorrhage over the right basal ganglia.  Dg Chest Port 1 View 09/15/2019 1.  Lines and tubes in good anatomic position. 2.  Progressive right lower lobe atelectasis/infiltrate. 09/10/2019 Resolving right midlung atelectasis. Tubes and lines as described. 09/09/2019 No acute cardiopulmonary abnormality. Appropriate positioning of the endotracheal and transesophageal tubes.   Renal US 09/13/2019 1. Mildly echogenic kidneys likely related to underlying medical renal disease. 2. Mild bilateral hydronephrosis.  No shadowing stone.  ECG - SR rate 97 BPM. LVH and prolonged QT interval. (See cardiology reading for complete details)   PHYSICAL  EXAM  Temp:  [97.5 F (36.4 C)-99.9 F (37.7 C)] 97.7 F (36.5 C) (09/19 1025) Pulse Rate:  [59-134] 98 (09/19 1025) Resp:  [16-29] 24 (09/19 1025) BP: (108-174)/(71-131) 165/121 (09/19 1025) SpO2:  [11 %-100 %] 98 % (09/19 1025) FiO2 (%):  [50 %-100 %] 50 % (09/19 0830) Weight:  [64 kg] 64 kg (09/19 0500)  General - Well nourished, well developed, intubated on precedex.  Ophthalmologic - fundi not visualized due to noncooperation.  Cardiovascular - Regular rate and rhythm.  Neuro - intubated on precedex, eyes closed but open on pain, not tracking and not following commands. With forced eye opening, eyes in right gaze position, not blinking to visual threat, doll's eyes present but left gaze palsy, pupils 22mm, sluggish to light. Corneal reflex present on the left, weak on the right, gag and cough present. Breathing over the vent.  Facial symmetry not able to test due to ET tube.  Tongue protrusion not cooperative. Spontaneous movement on the left. On pain stimulation, against gravity on the left but not on the right. RUE and RLE 2/5 on pain. Increased muscle tone on right UE and LE. DTR 3+ on the right and positive babinski on the right. Sensation, coordination and gait not tested.   ASSESSMENT/PLAN [name] is a 38 y.o. male with history of admission for Etoh poisoning presenting with a seizure, vomiting.   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 In addition, small focus of R basal ganglia ICH.   NSG - decompressive craniectomy and hematoma evacuation w/ flap in R abd - 09/09/19 Dr Vertell Limber  Code Stroke CT Head - Large intraparenchymal hematoma of the left basal ganglia measuring 7.8 x 4.8 cm with 14 mm of rightward midline shift and early subfalcine herniation. Intraventricular extension of hemorrhage. Small focus of intraparenchymal hemorrhage in the right basal ganglia.   CT/CTA  head - 09/10/2019 - Status post left pterional craniectomy for evacuation of left basal ganglia hematoma. Residual hematoma with interspersed postoperative gas measures 6.1 x 2.9 cm. Decreased midline shift, now measuring 6 mm to the right. No intracranial arterial occlusion or high-grade stenosis. No aneurysm or vascular malformation  Ct head 9/16 stable crani. Stable 5-6 midline shift. Decreased R basal ganglia hemorrhage   HgbA1c 5.6  LDL 86  [place] Corona Virus 2 - negative  UDS - negative  VTE prophylaxis - Heparin 5000 units sq tid   No antithrombotic prior to admission, now  on No antithrombotic d/t hemorrhage   Therapy recommendations:  pending  Disposition:  Pending  Cerebral Edema Comatose state  S/p crani and hematoma evacuation  Treated with 3% saline protocol, now off  Na - 158->161->157->156  Now Free water 200 q4h (started 09/15/19)  CT repeat in am  Respiratory Failure  Secondary to [place]  Off sedation and extubated 9/18  Re-intubated 9/18 due to low O2 sat and not protect airway  Now on precedex  CCM on board  VAP vs Aspiration PNA (vomited PTA) Leukocytosis, Febrile  WBC - 24.3->18.8->10.5->10.2  T Max - 101.7->afebrile  U/A neg 9/14  Blood cultures normal flora  CXR progressive RLL atx/infilatrate -> CXR - 09/16/19 neg  D/c Vanc, d/c Zosyn -> Unasyn (started 9/17)  Hypertensive Emergency  Probable undiagnosed / untreated hypertension (severe LVH on ECG)  Home BP meds: none  Current BP meds: amlodipine 10, hydralazine 50 tid, labetolol 200 tid    Off Lisinopril D/t increasing Cr  SBP goal < 160 mm Hg     Off cleviprex    Seizure  On presentation  Hx of one seizure previously  On Keppra 500 mg IV Bid  AKI on ? CKD  ACE stopped 9/14.   Renal US mild echogenic kidneys.   Creatinine - 2.80->2.64->2.62   continue IVF and free water  Alcohol abuse  Hx ETOH abuse  recent hospitalization for alcohol poisoning.    Drinks 1 pt Hennessey per day.   On FA/MVI/B1  Placed on CIWA protocol.  Hyperlipidemia  Home Lipid lowering medication: none  LDL - 86  Current lipid lowering medication: None  May consider statin at discharge  Dysphagia Malnutrition . Secondary to stroke . NPO  Has cortrak and tube feeding @ 55  Anemia  Hgb 14.5- 10.7- 8.5 - 7.3 - 6.9 - PRBC - 7.8->7.1->7.6  Unclear etiology  On PPI BID  Close CBC monitoring  Other Stroke Risk Factors  Polysubstance abuse, UDS - negative 09/09/19  Other Active Problems  Hypokalemia 3.6->3.4 - replaced ->4.3->3.9->3.8  Mild Thrombocytopenia - 147->124->172->201 (resolved)  Hypocalcemia - 8.7->8.4->8.6->8.7  Hospital day # 7  This patient is critically ill and at significant risk of neurological worsening, death and care requires constant monitoring of vital signs, hemodynamics,respiratory and cardiac monitoring, extensive review of multiple databases, frequent neurological assessment, discussion with family, other specialists and medical decision making of high complexity. I spent 35 minutes of neurocritical care time  in the care of  this patient.  Rosalin Hawking, MD PhD Stroke Neurology 09/16/2019 3:29 PM   To contact Stroke Continuity provider, please refer to [email or web address] After hours, contact General Neurology

## 2019-09-16 NOTE — Progress Notes (Signed)
PULMONARY / CRITICAL CARE MEDICINE   NAME:  Joshua [name], MRN:  [id number], DOB:  1981/11/18, LOS: 7 ADMISSION DATE:  09/09/2019, CONSULTATION DATE: 09/09/2019 REFERRING MD: Neurosurgery, CHIEF COMPLAINT: Vent dependent respiratory failure secondary to intracranial hemorrhage BRIEF HISTORY:    38 year old history of alcohol abuse seizures noncompliant hypertension who presented with new seizure right-sided hemiplegia confusion was found to have a large left intracranial hemorrhage for which he underwent craniectomy.  HISTORY OF PRESENT ILLNESS   38 year old male with a history of alcohol abuse to the point of alcohol poisoning resulting in a seizure.  He was in a drug house and was noted to have a seizure and woke up with right-sided hemiplegia.  He was noted to have vomited, urinary incontinence, bowel incontinence with confusion.  He was nonverbal and not following commands.  EMS was activated he is transported to [facility] evaluated by neurology and neurosurgery and was taken to the operating room for urgent left frontal temporal craniectomy evacuation hematoma with skull flap implantation. He was admitted to NSICU intubated.  SIGNIFICANT PAST MEDICAL HISTORY   Hypertension Alcohol abuse  SIGNIFICANT EVENTS:  09/08/2021 Admitted/  operating room for left frontal temporal craniectomy and evacuation hematoma 9/14 off hypertonic saline, fevers/ started on abx  9/15 ? Withdrawals?, started on precedex, cleviprex off overnight, weaned 8 hrs on SBT 5/5  9/16 decreasing Hgb, continues to wean well- mental status remains barrier 9/18 extubated >> reintubated due to protect airway and low oxygen  STUDIES:   CT of head 09/09/2019 >> 1. 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 2. Intraventricular extension of hemorrhage. Small focus of intraparenchymal hemorrhage in the right basal  ganglia.  CT of head 09/10/2019-decreased size of left basal ganglia hematoma with expected postoperative air.  Mild improvement of associated mass-effect.  Redistribution of intraventricular blood.  [place] 9/16 >>  1.  Stable changes post left frontoparietal craniectomy for evacuation of left-sided central hemorrhagic stroke. Residual without significant change measuring 2.8 x 6.5 cm with less air within the hemorrhage. Stable postsurgical change with mild edema/mass effect and stable midline shift to the right of 5-6 mm. 2.  Decrease in density 4 mm focus of hemorrhage over the right basal ganglia.  Renal US 9/16 >> 1. Mildly echogenic kidneys likely related to underlying medical renal disease. 2. Mild bilateral hydronephrosis.  No shadowing stone.  CULTURES:  09/09/2019 sputum>>normal flora 9/12 MRSA PCR >> neg 9/12 COVID >> neg 9/14 sputum>> normal flora  ANTIBIOTICS:  vanc 9/14>> 9/16 Pip-tazo 9/14>>9/17 Unasyn 9/17 >>  LINES/TUBES:  09/09/2019 endotracheal tube 09/09/2019 orogastric tube >> 9/14 09/09/2019 right radial A-line >> 9/15 9/14 L cortrak >>  CONSULTANTS:  09/09/2019 pulmonary critical care 09/09/2019 neurology 09/09/2019 NSGY  SUBJECTIVE:  Full vent support   CONSTITUTIONAL: BP (!) 129/91   Pulse 69   Temp 98.6 F (37 C) (Esophageal)   Resp 18   Ht 5\' 9"  (1.753 m)   Wt 64 kg   SpO2 100%   BMI 20.84 kg/m   I/O last 3 completed shifts: In: 3692.6 [I.V.:1204.7; NG/GT:2055; IV Piggyback:432.9] Out: 4100 [Urine:3650; Stool:450]  Vent Mode: PRVC FiO2 (%):  [30 %-100 %] 50 % Set Rate:  [16 bmp] 16 bmp Vt Set:  [560 mL] 560 mL PEEP:  [5 cmH20] 5 cmH20 Pressure Support:  [5 cmH20] 5 cmH20 Plateau Pressure:  [16 cmH20-19 cmH20] 16 cmH20  PHYSICAL EXAM:  General - sedated Eyes - pupils reactive ENT - ETT in place Cardiac - regular rate/rhythm, no murmur Chest - equal breath sounds b/l, no wheezing or rales Abdomen - soft, non tender, + bowel  sounds Extremities - no cyanosis, clubbing, or edema Skin - no rashes Neuro - RASS -3  CXR (reviewed by me) - RLL ASD  RESOLVED PROBLEM LIST  Thrombocytopenia  ASSESSMENT AND PLAN    Acute hypoxic respiratory failure with compromised airway in setting of ICH. - failed extubation attempt 9/18 - full vent support - likely will need trach to assist with vent weaning  Aspiration pneumonia. - day 6 of ABx, currently on unasyn  Lt BG ICH with midline shift and subfalcine herniation complicated by cerebral edema and seizures. - s/p Lt decompressive craniectomy with evacuation of hematoma - continue keppra - therapeutic hypernatremia per neurology >> off 3% saline 9/14  Hypertension. - goal SBP < 160 - continue norvasc, hydralazine, labetalol  AKI -unclear baseline sCr, suspected CKD Hypernatremia- initially iatrogenic with hypertonic saline Mild hypokalemia Mild hyperchloremic acidosis - worsening sCr 9/16.  ACEi stopped 9/14.  - FeNa suggestive prerenal, renal US c/w CKD, mild hydroneprhosis without stone - f/u BMET  Polysubstance/ ETOH abuse - UDS negative.   - Reportedly drinks Hennessey 6 shots to 1pt a day - continue thiamine, folic acid, MVI - goal RASS 0 to -1 with precedex  Hyperglycemia - SSI  Anemia of critical illness - f/u CBC - transfuse for Hb < 7 or significant bleeding  Best Practice / Goals of Care / Disposition.    DVT PROPHYLAXIS: SCDs, [place] heparin  GI prophylaxis: PPI BID per tube Pain agitation: RASS goal 0/-1, precedex, prn fentanyl NUTRITION: NPO/  tube feedings at goal  MOBILITY: Bedrest GOALS OF CARE: Full code Foley catheter: condom catheter FAMILY DISCUSSIONS: pending  DISPOSITION neuro ICU  LABS   CMP Latest Ref Rng & Units 09/15/2019 09/15/2019 09/15/2019  Glucose 70 - 99 mg/dL 128(H) - 147(H)  BUN 6 - 20 mg/dL 37(H) - 38(H)  Creatinine 0.61 - 1.24 mg/dL 2.62(H) - 2.64(H)  Sodium 135 - 145 mmol/L 157(H) 161(HH) 158(H)  Potassium  3.5 - 5.1 mmol/L 3.9 4.3 3.4(L)  Chloride 98 - 111 mmol/L 125(H) - 128(H)  CO2 22 - 32 mmol/L 26 - 22  Calcium 8.9 - 10.3 mg/dL 8.6(L) - 8.4(L)  Total Protein 6.5 - 8.1 g/dL - - -  Total Bilirubin 0.3 - 1.2 mg/dL - - -  Alkaline Phos 38 - 126 U/L - - -  AST 15 - 41 U/L - - -  ALT 0 - 44 U/L - - -   CBC Latest Ref Rng & Units 09/16/2019 09/15/2019 09/15/2019  WBC 4.0 - 10.5 K/uL 10.2 - 10.5  Hemoglobin 13.0 - 17.0 g/dL 7.6(L) 7.1(L) 7.8(L)  Hematocrit 39.0 - 52.0 % 24.6(L) 21.0(L) 24.7(L)  Platelets 150 - 400 K/uL 201 - 172   ABG    Component Value Date/Time   PHART 7.404 09/15/2019 1250   PCO2ART 41.2 09/15/2019 1250   PO2ART 297.0 (H) 09/15/2019 1250   HCO3 25.8 09/15/2019 1250   TCO2 27 09/15/2019 1250   ACIDBASEDEF 4.0 (H) 09/09/2019 0835   O2SAT 100.0 09/15/2019 1250   CBG (last 3)  Recent Labs    09/15/19 1921 09/15/19 2326 09/16/19 0331  GLUCAP 114* 155* 133*    CC time 31 minutes  Chesley Mires, MD [facility] Pulmonary/Critical Care 09/16/2019, 7:18 AM

## 2019-09-16 NOTE — Progress Notes (Signed)
*  PRELIMINARY RESULTS* Echocardiogram 2D Echocardiogram has been performed.  Samuel Germany 09/16/2019, 3:36 PM

## 2019-09-17 ENCOUNTER — Inpatient Hospital Stay (HOSPITAL_COMMUNITY): Payer: Medicaid Other

## 2019-09-17 DIAGNOSIS — Z9889 Other specified postprocedural states: Secondary | ICD-10-CM

## 2019-09-17 DIAGNOSIS — Z978 Presence of other specified devices: Secondary | ICD-10-CM

## 2019-09-17 LAB — COMPREHENSIVE METABOLIC PANEL
ALT: 38 U/L (ref 0–44)
AST: 35 U/L (ref 15–41)
Albumin: 2.1 g/dL — ABNORMAL LOW (ref 3.5–5.0)
Alkaline Phosphatase: 41 U/L (ref 38–126)
Anion gap: 6 (ref 5–15)
BUN: 35 mg/dL — ABNORMAL HIGH (ref 6–20)
CO2: 25 mmol/L (ref 22–32)
Calcium: 8.9 mg/dL (ref 8.9–10.3)
Chloride: 126 mmol/L — ABNORMAL HIGH (ref 98–111)
Creatinine, Ser: 2.18 mg/dL — ABNORMAL HIGH (ref 0.61–1.24)
GFR calc Af Amer: 43 mL/min — ABNORMAL LOW (ref >60)
GFR calc non Af Amer: 37 mL/min — ABNORMAL LOW (ref >60)
Glucose, Bld: 126 mg/dL — ABNORMAL HIGH (ref 70–99)
Potassium: 4.3 mmol/L (ref 3.5–5.1)
Sodium: 157 mmol/L — ABNORMAL HIGH (ref 135–145)
Total Bilirubin: 0.6 mg/dL (ref 0.3–1.2)
Total Protein: 6.3 g/dL — ABNORMAL LOW (ref 6.5–8.1)

## 2019-09-17 LAB — GLUCOSE, CAPILLARY
Glucose-Capillary: 110 mg/dL — ABNORMAL HIGH (ref 70–99)
Glucose-Capillary: 116 mg/dL — ABNORMAL HIGH (ref 70–99)
Glucose-Capillary: 124 mg/dL — ABNORMAL HIGH (ref 70–99)
Glucose-Capillary: 126 mg/dL — ABNORMAL HIGH (ref 70–99)
Glucose-Capillary: 128 mg/dL — ABNORMAL HIGH (ref 70–99)
Glucose-Capillary: 136 mg/dL — ABNORMAL HIGH (ref 70–99)

## 2019-09-17 LAB — CBC
HCT: 24.6 % — ABNORMAL LOW (ref 39.0–52.0)
Hemoglobin: 7.6 g/dL — ABNORMAL LOW (ref 13.0–17.0)
MCH: 29.8 pg (ref 26.0–34.0)
MCHC: 30.9 g/dL (ref 30.0–36.0)
MCV: 96.5 fL (ref 80.0–100.0)
Platelets: 225 10*3/uL (ref 150–400)
RBC: 2.55 MIL/uL — ABNORMAL LOW (ref 4.22–5.81)
RDW: 15.9 % — ABNORMAL HIGH (ref 11.5–15.5)
WBC: 9.3 10*3/uL (ref 4.0–10.5)
nRBC: 0.9 % — ABNORMAL HIGH (ref 0.0–0.2)

## 2019-09-17 NOTE — Progress Notes (Signed)
STROKE TEAM PROGRESS NOTE   INTERVAL HISTORY Patient on vent, decreased precedex and then pt coughing frequently, has to give fentanyl PRN. CT stable hematoma and MLS.   OBJECTIVE Vitals:   09/17/19 0400 09/17/19 0500 09/17/19 0600 09/17/19 0700  BP: (!) 141/100 (!) 125/95 119/89 128/90  Pulse: 64 66 64   Resp: 16 16 16 16   Temp: (!) 96.4 F (35.8 C) (!) 96.6 F (35.9 C) (!) 97 F (36.1 C) (!) 97.2 F (36.2 C)  TempSrc: Esophageal     SpO2: 100% 100% 100%   Weight:      Height:        CBC:  Recent Labs  Lab 09/11/19 0445  09/16/19 0613 09/17/19 0519  WBC 18.8*   < > 10.2 9.3  NEUTROABS 16.4*  --   --   --   HGB 8.5*   < > 7.6* 7.6*  HCT 26.0*   < > 24.6* 24.6*  MCV 91.2   < > 96.1 96.5  PLT 124*   < > 201 225   < > = values in this interval not displayed.    Basic Metabolic Panel:  Recent Labs  Lab 09/15/19 0541  09/16/19 0613 09/17/19 0519  NA 158*   < > 156* 157*  K 3.4*   < > 3.8 4.3  CL 128*   < > 123* 126*  CO2 22   < > 24 25  GLUCOSE 147*   < > 172* 126*  BUN 38*   < > 37* 35*  CREATININE 2.64*   < > 2.44* 2.18*  CALCIUM 8.4*   < > 8.7* 8.9  MG 2.7*  --  2.6*  --   PHOS 3.6  --  3.8  --    < > = values in this interval not displayed.    Lipid Panel:     Component Value Date/Time   CHOL 134 09/16/2019 0613   TRIG 101 09/16/2019 0613   HDL 28 (L) 09/16/2019 0613   CHOLHDL 4.8 09/16/2019 0613   VLDL 20 09/16/2019 0613   LDLCALC 86 09/16/2019 0613   HgbA1c:  Lab Results  Component Value Date   HGBA1C 5.6 09/10/2019   Urine Drug Screen:     Component Value Date/Time   LABOPIA NONE DETECTED 09/09/2019 0349   COCAINSCRNUR NONE DETECTED 09/09/2019 0349   LABBENZ NONE DETECTED 09/09/2019 0349   AMPHETMU NONE DETECTED 09/09/2019 0349   THCU NONE DETECTED 09/09/2019 0349   LABBARB NONE DETECTED 09/09/2019 0349    Alcohol Level     Component Value Date/Time   ETH <10 09/09/2019 0407    IMAGING  Ct Head Code Stroke Wo  Contrast 09/09/2019 1. 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  2. Intraventricular extension of hemorrhage. Small focus of intraparenchymal hemorrhage in the right basal ganglia.   CT head  CTA Head  09/10/2019 1. Status post left pterional craniectomy for evacuation of left basal ganglia hematoma. Residual hematoma with interspersed postoperative gas measures 6.1 x 2.9 cm. 2. Decreased midline shift, now measuring 6 mm to the right. 3. Unchanged punctate focus of hemorrhage at the posterior right insula. 4. Unchanged intraventricular blood. 5. No intracranial arterial occlusion or high-grade stenosis. No aneurysm or vascular malformation.   CT head  09/13/2019 Stable changes post left frontoparietal craniectomy for evacuation of left-sided central hemorrhagic stroke. Residual without significant change measuring 2.8  x 6.5 cm with less air within the hemorrhage. Stable postsurgical change with mild edema/mass effect and stable midline shift to the right of 5-6 mm. Decrease in density 4 mm focus of hemorrhage over the right basal ganglia.  CT Head W/O Contrast 09/17/19 IMPRESSION: 1. Unchanged size of intraparenchymal hematoma centered in the left basal ganglia with unchanged surrounding edema and mass effect. 2. Unchanged rightward midline shift, measuring 6 mm.  Dg Chest Port 1 View 09/15/2019 1.  Lines and tubes in good anatomic position. 2.  Progressive right lower lobe atelectasis/infiltrate. 09/10/2019 Resolving right midlung atelectasis. Tubes and lines as described. 09/09/2019 No acute cardiopulmonary abnormality. Appropriate positioning of the endotracheal and transesophageal tubes.   DG Chest Port 1 View 09/17/19 IMPRESSION: 1. Lungs now clear. 2. No new abnormalities. 3. Stable support apparatus.  Renal US 09/13/2019 1. Mildly echogenic kidneys likely related to  underlying medical renal disease. 2. Mild bilateral hydronephrosis.  No shadowing stone.  ECG - SR rate 97 BPM. LVH and prolonged QT interval. (See cardiology reading for complete details)  Transthoracic Echocardiogram  09/16/2019 IMPRESSIONS 1. Left ventricular ejection fraction, by visual estimation, is 70 to 75%. The left ventricle has hyperdynamic function. Normal left ventricular size. There is severely increased left ventricular hypertrophy.  2. Global right ventricle has normal systolic function.The right ventricular size is normal. No increase in right ventricular wall thickness.  3. Left atrial size was normal.  4. Right atrial size was moderately dilated.  5. The mitral valve is grossly normal. Mild mitral valve regurgitation.  6. The tricuspid valve is grossly normal. Tricuspid valve regurgitation is trivial.  7. The aortic valve is tricuspid Aortic valve regurgitation was not visualized by color flow Doppler.  8. The pulmonic valve was grossly normal. Pulmonic valve regurgitation is not visualized by color flow Doppler.  9. The inferior vena cava was not able to be adequately assessed to estimate CVP.   PHYSICAL EXAM  Temp:  [96.1 F (35.6 C)-98.8 F (37.1 C)] 97.2 F (36.2 C) (09/20 0700) Pulse Rate:  [63-106] 64 (09/20 0600) Resp:  [16-24] 16 (09/20 0700) BP: (111-165)/(75-122) 128/90 (09/20 0700) SpO2:  [11 %-100 %] 100 % (09/20 0600) FiO2 (%):  [40 %-50 %] 40 % (09/20 0400) Weight:  [67 kg] 67 kg (09/20 0343)  General - Well nourished, well developed, intubated on low dose precedex.  Ophthalmologic - fundi not visualized due to noncooperation.  Cardiovascular - Regular rate and rhythm.  Neuro - intubated on precedex, eyes half way open, frequent cough on vent, not tracking and not following commands. With forced eye opening, eyes in mid position, not blinking to visual threat, doll's eyes sluggish, pupils 65mm, sluggish to light. Corneal reflex present on the  left, weak on the right, gag and cough present. Breathing over the vent.  Facial symmetry not able to test due to ET tube.  Tongue protrusion not cooperative. Spontaneous movement on the left. On pain stimulation, against gravity on the left but not on the right. RUE and RLE slight withdraw on pain. Increased muscle tone on right UE and LE. DTR 3+ on the right and positive babinski on the right. Sensation, coordination and gait not tested.   ASSESSMENT/PLAN Mr. Kyrese Rettke is a 38 y.o. male with history of admission for Etoh poisoning presenting with a seizure, vomiting.   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 In addition, small  focus of R basal ganglia ICH.   NSG - decompressive craniectomy and hematoma evacuation w/ flap in R abd - 09/09/19 Dr Vertell Limber  Code Stroke CT Head - Large intraparenchymal hematoma of the left basal ganglia measuring 7.8 x 4.8 cm with 14 mm of rightward midline shift and early subfalcine herniation. Intraventricular extension of hemorrhage. Small focus of intraparenchymal hemorrhage in the right basal ganglia.   CT/CTA head - 09/10/2019 - Status post left pterional craniectomy for evacuation of left basal ganglia hematoma. Residual hematoma with interspersed postoperative gas measures 6.1 x 2.9 cm. Decreased midline shift, now measuring 6 mm to the right. No intracranial arterial occlusion or high-grade stenosis. No aneurysm or vascular malformation  Ct head 9/16 and 9/20 stable crani. Stable 5-6 midline shift. Decreased R basal ganglia hemorrhage   HgbA1c 5.6  LDL 86  TTE 09/16/19 EF 70 to 75%. There is severely increased left ventricular hypertrophy.  [employer] Virus 2 - negative  UDS - negative  VTE prophylaxis - Heparin 5000 units sq tid   No antithrombotic prior to admission, now on No antithrombotic d/t hemorrhage   Therapy recommendations:   pending  Disposition:  Pending  Cerebral Edema Comatose state  S/p crani and hematoma evacuation  Treated with 3% saline protocol, now off  Na - 158->161->157->156->157  Now Free water 200 q4h   CT repeat 09/17/19 - Unchanged size of intraparenchymal hematoma and surrounding edema and mass effect.   Respiratory Failure  Secondary to [place]  Off sedation and extubated 9/18  Re-intubated 9/18 due to low O2 sat and not protect airway  Now on precedex  CCM on board  VAP vs Aspiration PNA (vomited PTA) Leukocytosis, Febrile  WBC - 24.3->18.8->10.5->10.2->9.3  T Max - 101.7->afebrile  U/A neg 9/14  Blood cultures normal flora  CXR progressive RLL atx/infilatrate -> CXR - 09/16/19 neg  D/c Vanc, d/c Zosyn -> Unasyn (started 9/17)  Hypertensive Emergency  Probable undiagnosed / untreated hypertension (severe LVH on ECG)  Home BP meds: none  Current BP meds: amlodipine 10, hydralazine 50 tid, labetolol 200 tid    Off Lisinopril D/t increasing Cr  SBP goal < 160 mm Hg     Off cleviprex    Seizure  On presentation  Hx of one seizure previously  On Keppra 500 mg IV Bid  AKI on ? CKD  ACE stopped 9/14.   Renal US mild echogenic kidneys.   Creatinine - 2.80->2.64->2.62->2.18  continue IVF and free water  Alcohol abuse  Hx ETOH abuse  recent hospitalization for alcohol poisoning.   Drinks 1 pt Hennessey per day.   On FA/MVI/B1  Placed on CIWA protocol.  Hyperlipidemia  Home Lipid lowering medication: none  LDL - 86  Current lipid lowering medication: None  May consider statin at discharge  Dysphagia Malnutrition . Secondary to stroke . NPO  Has cortrak and tube feeding @ 55  Anemia  Hgb 14.5- 10.7- 8.5 - 7.3 - 6.9 - PRBC - 7.8->7.1->7.6->7.6  Unclear etiology  On PPI BID  Close CBC monitoring  Other Stroke Risk Factors  Polysubstance abuse, UDS - negative 09/09/19  Other Active Problems  Hypokalemia 3.6->3.4 -  replaced ->4.3->3.9->3.8->4.3  Mild Thrombocytopenia - 147->124->172->201 (resolved)->225  Hypocalcemia - 8.7->8.4->8.6->8.7->8.9  Hospital day # 8  This patient is critically ill and at significant risk of neurological worsening, death and care requires constant monitoring of vital signs, hemodynamics,respiratory and cardiac monitoring, extensive review of multiple databases, frequent neurological assessment, discussion with family, other specialists  and medical decision making of high complexity. I spent 35 minutes of neurocritical care time  in the care of  this patient. I had long discussion with girlfriend at bedside and sister over the phone, updated pt current condition, treatment plan and potential prognosis. They expressed understanding and appreciation.   Rosalin Hawking, MD PhD Stroke Neurology 09/17/2019 7:08 PM   To contact Stroke Continuity provider, please refer to [email or web address] After hours, contact General Neurology

## 2019-09-17 NOTE — Progress Notes (Signed)
PULMONARY / CRITICAL CARE MEDICINE   NAME:  Joshua [name], MRN:  [id number], DOB:  [birth year], LOS: 8 ADMISSION DATE:  09/09/2019, CONSULTATION DATE: 09/09/2019 REFERRING MD: Neurosurgery, CHIEF COMPLAINT: Vent dependent respiratory failure secondary to intracranial hemorrhage BRIEF HISTORY:    38 year old history of alcohol abuse seizures noncompliant hypertension who presented with new seizure right-sided hemiplegia confusion was found to have a large left intracranial hemorrhage for which he underwent craniectomy.  HISTORY OF PRESENT ILLNESS   38 year old male with a history of alcohol abuse to the point of alcohol poisoning resulting in a seizure.  He was in a drug house and was noted to have a seizure and woke up with right-sided hemiplegia.  He was noted to have vomited, urinary incontinence, bowel incontinence with confusion.  He was nonverbal and not following commands.  EMS was activated he is transported to [facility] evaluated by neurology and neurosurgery and was taken to the operating room for urgent left frontal temporal craniectomy evacuation hematoma with skull flap implantation. He was admitted to NSICU intubated.  SIGNIFICANT PAST MEDICAL HISTORY   Hypertension Alcohol abuse  SIGNIFICANT EVENTS:  09/08/2021 Admitted/  operating room for left frontal temporal craniectomy and evacuation hematoma 9/14 off hypertonic saline, fevers/ started on abx  9/15 ? Withdrawals?, started on precedex, cleviprex off overnight, weaned 8 hrs on SBT 5/5  9/16 decreasing Hgb, continues to wean well- mental status remains barrier 9/18 extubated >> reintubated due to protect airway and low oxygen 9/19 - full vent support   STUDIES:   CT of head 09/09/2019 >> 1. 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 2. Intraventricular extension of hemorrhage. Small focus of intraparenchymal  hemorrhage in the right basal ganglia.  CT of head 09/10/2019-decreased size of left basal ganglia hematoma with expected postoperative air.  Mild improvement of associated mass-effect.  Redistribution of intraventricular blood.  [place] 9/16 >>  1.  Stable changes post left frontoparietal craniectomy for evacuation of left-sided central hemorrhagic stroke. Residual without significant change measuring 2.8 x 6.5 cm with less air within the hemorrhage. Stable postsurgical change with mild edema/mass effect and stable midline shift to the right of 5-6 mm. 2.  Decrease in density 4 mm focus of hemorrhage over the right basal ganglia.  Renal US 9/16 >> 1. Mildly echogenic kidneys likely related to underlying medical renal disease. 2. Mild bilateral hydronephrosis.  No shadowing stone.  CULTURES:  09/09/2019 sputum>>normal flora 9/12 MRSA PCR >> neg 9/12 COVID >> neg 9/14 sputum>> normal flora  ANTIBIOTICS:  vanc 9/14>> 9/16 Pip-tazo 9/14>>9/17 Unasyn 9/17 >>  LINES/TUBES:  09/09/2019 endotracheal tube 09/09/2019 orogastric tube >> 9/14 09/09/2019 right radial A-line >> 9/15 9/14 L cortrak >>  CONSULTANTS:  09/09/2019 pulmonary critical care 09/09/2019 neurology 09/09/2019 NSGY  SUBJECTIVE:    9/20 - reamins on vent. On precedex gt. Occ agitation. RN says needs trach   CONSTITUTIONAL: BP (!) 128/95 (BP Location: Right Arm)   Pulse 63   Temp 97.7 F (36.5 C) (Esophageal)   Resp 16   Ht 5\' 9"  (1.753 m)   Wt 67 kg   SpO2 100%   BMI 21.81 kg/m   I/O last 3 completed shifts: In: 5707.9 [I.V.:1168.1; NG/GT:4140; IV Piggyback:399.8] Out: [id number] [Urine:2525; Emesis/NG output:900; Stool:150]  Vent Mode: PRVC FiO2 (%):  [40 %-50 %] 40 % Set Rate:  [16 bmp] 16 bmp Vt Set:  [560 mL] 560  mL PEEP:  [5 cmH20] 5 cmH20 Plateau Pressure:  [15 [id number] cmH20] 20 cmH20    PHYSICAL EXAM: General Appearance:  Looks criticall ill Head:  LEft side head with sutures and shafved Eyes:  PERRL  - yes, conjunctiva/corneas - muddy     Ears:  Normal external ear canals, both ears Nose:  G tube - no Throat:  ETT TUBE - yes , OG tube - yes Neck:  Supple,  No enlargement/tenderness/nodules Lungs: Clear to auscultation bilaterally, Ventilator   Synchrony - yes 40% fio2 Heart:  S1 and S2 normal, no murmur, CVP - no.  Pressors - no Abdomen:  Soft, no masses, no organomegaly Genitalia / Rectal:  Not done Extremities:  Extremities- intact Skin:  ntact in exposed areas . Sacral area - not examind Neurologic:  Sedation - precedex gtt -> RASS - -5       RESOLVED PROBLEM LIST  Thrombocytopenia  ASSESSMENT AND PLAN    Acute hypoxic respiratory failure with compromised airway in setting of ICH.   09/17/2019 - > does not meet criteria for SBT/Extubation in setting of Acute Respiratory Failure due to CNS issues  Plan  - PRVC full support;- failed extubation attempt 9/18 - likely will need trach to assist with vent weaning  Aspiration pneumonia. - day 7 of ABx, currently on unasyn  Lt BG ICH with midline shift and subfalcine herniation complicated by cerebral edema and seizures. - s/p Lt decompressive craniectomy with evacuation of hematoma - no change CT head 09/17/2019 - continue keppra - therapeutic hypernatremia per neurology >> off 3% saline 9/14  Hypertension. - goal SBP < 160 - continue norvasc, hydralazine, labetalol  AKI -unclear baseline sCr, suspected CKD Hypernatremia- initially iatrogenic with hypertonic saline Mild hypokalemia Mild hyperchloremic acidosis - worsening sCr 9/16.  ACEi stopped 9/14.  - improved creat 09/17/2019 - FeNa suggestive prerenal, renal US c/w CKD, mild hydroneprhosis without stone - f/u BMET  Polysubstance/ ETOH abuse - UDS negative.   - Reportedly drinks Hennessey 6 shots to 1pt a day - continue thiamine, folic acid, MVI - goal RASS 0 to -1 with precedex  Hyperglycemia - SSI  Anemia of critical illness - f/u CBC - transfuse for  Hb < 7 or significant bleeding  Best Practice / Goals of Care / Disposition.    DVT PROPHYLAXIS: SCDs, [place] heparin  GI prophylaxis: PPI BID per tube Pain agitation: RASS goal 0/-1, precedex, prn fentanyl NUTRITION: NPO/  tube feedings at goal  MOBILITY: Bedrest GOALS OF CARE: Full code Foley catheter: condom catheter FAMILY DISCUSSIONS: per primary team DISPOSITION neuro ICU   ATTESTATION & SIGNATURE   The patient Joshua [name] is critically ill with multiple organ systems failure and requires high complexity decision making for assessment and support, frequent evaluation and titration of therapies, application of advanced monitoring technologies and extensive interpretation of multiple databases.   Critical Care Time devoted to patient care services described in this note is  30  Minutes. This time reflects time of care of this signee Dr Brand Males. This critical care time does not reflect procedure time, or teaching time or supervisory time of PA/NP/Med student/Med Resident etc but could involve care discussion time     Dr. Brand Males, M.D., [facility].C.P Pulmonary and Critical Care Medicine Staff Physician [place] [facility] Pager: [phone], If no answer or between  15:00h - 7:00h: call [phone]  09/17/2019 8:51 AM  `  LABS  PULMONARY Recent Labs  Lab 09/13/19 0409 09/15/19 1250  PHART 7.464* 7.404  PCO2ART 34.2 41.2  PO2ART 76.0* 297.0*  HCO3 24.6 25.8  TCO2 26 27  O2SAT 96.0 100.0    CBC Recent Labs  Lab 09/15/19 0541 09/15/19 1250 09/16/19 0613 09/17/19 0519  HGB 7.8* 7.1* 7.6* 7.6*  HCT 24.7* 21.0* 24.6* 24.6*  WBC 10.5  --  10.2 9.3  PLT 172  --  201 225    COAGULATION No results for input(s): INR in the last 168 hours.  CARDIAC  No results for input(s): TROPONINI in the last 168 hours. No results for input(s): PROBNP in the last 168 hours.   CHEMISTRY Recent Labs  Lab 09/14/19 0853  09/15/19 0541 09/15/19 1250 09/15/19 1931 09/16/19 0613 09/17/19 0519  NA 155* 158* 161* 157* 156* 157*  K 3.6 3.4* 4.3 3.9 3.8 4.3  CL 122* 128*  --  125* 123* 126*  CO2 23 22  --  26 24 25   GLUCOSE 129* 147*  --  128* 172* 126*  BUN 39* 38*  --  37* 37* 35*  CREATININE 2.80* 2.64*  --  2.62* 2.44* 2.18*  CALCIUM 8.7* 8.4*  --  8.6* 8.7* 8.9  MG 2.2 2.7*  --   --  2.6*  --   PHOS 5.1* 3.6  --   --  3.8  --    Estimated Creatinine Clearance: 43.1 mL/min (A) (by C-G formula based on SCr of 2.18 mg/dL (H)).   LIVER Recent Labs  Lab 09/14/19 0853 09/17/19 0519  AST  --  35  ALT  --  38  ALKPHOS  --  41  BILITOT  --  0.6  PROT  --  6.3*  ALBUMIN 2.1* 2.1*     INFECTIOUS Recent Labs  Lab 09/13/19 0850  PROCALCITON 0.76     ENDOCRINE CBG (last 3)  Recent Labs    09/16/19 2304 09/17/19 0313 09/17/19 0824  GLUCAP 152* 124* 136*         IMAGING x48h  - image(s) personally visualized  -   highlighted in bold Ct Head Wo Contrast  Result Date: 09/17/2019 CLINICAL DATA:  Intracranial hemorrhage follow up EXAM: CT HEAD WITHOUT CONTRAST TECHNIQUE: Contiguous axial images were obtained from the base of the skull through the vertex without intravenous contrast. COMPARISON:  Head CT 09/13/2019 FINDINGS: Brain: The size of the intraparenchymal hematoma centered in the left basal ganglia is unchanged. Surrounding edema and postsurgical changes also remain the same. There is a small amount of blood within both occipital horns. Rightward midline shift measures 6 mm at the level of the foramina [place], unchanged. No new site of hemorrhage. Vascular: No abnormal hyperdensity of the major intracranial arteries or dural venous sinuses. No intracranial atherosclerosis. Skull: Left pterional craniectomy with soft tissue swelling overlying. Sinuses/Orbits: No fluid levels or advanced mucosal thickening of the visualized paranasal sinuses. No mastoid or middle ear effusion. The orbits  are normal. IMPRESSION: 1. Unchanged size of intraparenchymal hematoma centered in the left basal ganglia with unchanged surrounding edema and mass effect. 2. Unchanged rightward midline shift, measuring 6 mm. Electronically Signed   By: Ulyses Jarred M.D.   On: 09/17/2019 01:46   Dg Chest Port 1 View  Result Date: 09/17/2019 CLINICAL DATA:  Respiratory failure. Follow-up exam. Intubated patient. EXAM: PORTABLE CHEST 1 VIEW COMPARISON:  09/16/2019 and exams. FINDINGS: Basilar atelectasis has improved.  Lungs now clear. No pneumothorax or pleural effusion. Endotracheal tube, enteric tube and  left PICC are stable. IMPRESSION: 1. Lungs now clear. 2. No new abnormalities. 3. Stable support apparatus. Electronically Signed   By: Lajean Manes M.D.   On: 09/17/2019 06:35   Dg Chest Port 1 View  Result Date: 09/16/2019 CLINICAL DATA:  Acute respiratory failure.  Follow-up exam. EXAM: PORTABLE CHEST 1 VIEW COMPARISON:  09/15/2019 and earlier exams. FINDINGS: Cardiac silhouette mildly enlarged. Atelectasis noted at the right lung base on the prior study has improved. Remainder of the lungs is clear. Endotracheal tube, left-sided PICC and enteric feeding tube are stable. IMPRESSION: 1. No acute cardiopulmonary disease. Right lung base atelectasis has improved. 2. Support apparatus is stable and well positioned. Electronically Signed   By: Lajean Manes M.D.   On: 09/16/2019 11:36

## 2019-09-17 NOTE — Progress Notes (Signed)
Neurosurgery Service Progress Note  Subjective: No acute events overnight   Objective: Vitals:   09/17/19 0600 09/17/19 0700 09/17/19 0800 09/17/19 0820  BP: 119/89 128/90 (!) 128/95   Pulse: 64  63   Resp: '16 16 16   ' Temp: (!) 97 F (36.1 C) (!) 97.2 F (36.2 C) 97.7 F (36.5 C)   TempSrc:   Esophageal   SpO2: 100%  100% 100%  Weight:      Height:       Temp (24hrs), Avg:97.7 F (36.5 C), Min:96.1 F (35.6 C), Max:98.8 F (37.1 C)  CBC Latest Ref Rng & Units 09/17/2019 09/16/2019 09/15/2019  WBC 4.0 - 10.5 K/uL 9.3 10.2 -  Hemoglobin 13.0 - 17.0 g/dL 7.6(L) 7.6(L) 7.1(L)  Hematocrit 39.0 - 52.0 % 24.6(L) 24.6(L) 21.0(L)  Platelets 150 - 400 K/uL 225 201 -   BMP Latest Ref Rng & Units 09/17/2019 09/16/2019 09/15/2019  Glucose 70 - 99 mg/dL 126(H) 172(H) 128(H)  BUN 6 - 20 mg/dL 35(H) 37(H) 37(H)  Creatinine 0.61 - 1.24 mg/dL 2.18(H) 2.44(H) 2.62(H)  Sodium 135 - 145 mmol/L 157(H) 156(H) 157(H)  Potassium 3.5 - 5.1 mmol/L 4.3 3.8 3.9  Chloride 98 - 111 mmol/L 126(H) 123(H) 125(H)  CO2 22 - 32 mmol/L '25 24 26  ' Calcium 8.9 - 10.3 mg/dL 8.9 8.7(L) 8.6(L)    Intake/Output Summary (Last 24 hours) at 09/17/2019 0842 Last data filed at 09/17/2019 0700 Gross per 24 hour  Intake 3865.21 ml  Output 2575 ml  Net 1290.21 ml    Current Facility-Administered Medications:  .  acetaminophen (TYLENOL) tablet 650 mg, 650 mg, Per Tube, Q4H PRN, 650 mg at 09/15/19 1018 **OR** acetaminophen (TYLENOL) suppository 650 mg, 650 mg, Rectal, Q4H PRN, Leonie Man, Pramod S, MD .  amLODipine (NORVASC) tablet 10 mg, 10 mg, Per Tube, Daily, Agarwala, Ravi, MD, 10 mg at 09/16/19 1025 .  Ampicillin-Sulbactam (UNASYN) 3 g in sodium chloride 0.9 % 100 mL IVPB, 3 g, Intravenous, Q8H, KoAltha Harm, RPH, Stopped at 09/17/19 0211 .  bisacodyl (DULCOLAX) suppository 10 mg, 10 mg, Rectal, Daily PRN, Erline Levine, MD .  chlorhexidine gluconate (MEDLINE KIT) (PERIDEX) 0.12 % solution 15 mL, 15 mL, Mouth Rinse, BID,  Erline Levine, MD, 15 mL at 09/17/19 0743 .  Chlorhexidine Gluconate Cloth 2 % PADS 6 each, 6 each, Topical, Daily, Anders Simmonds, MD, 6 each at 09/16/19 2240 .  dexmedetomidine (PRECEDEX) 400 MCG/100ML (4 mcg/mL) infusion, 0-1.2 mcg/kg/hr, Intravenous, Titrated, Rosalin Hawking, MD, Last Rate: 17.6 mL/hr at 09/17/19 0700, 1.1 mcg/kg/hr at 09/17/19 0700 .  feeding supplement (OSMOLITE 1.5 CAL) liquid 1,000 mL, 1,000 mL, Per Tube, Continuous, Rigoberto Noel, MD, Last Rate: 55 mL/hr at 09/17/19 0616, 1,000 mL at 09/17/19 0616 .  feeding supplement (PRO-STAT SUGAR FREE 64) liquid 30 mL, 30 mL, Per Tube, BID, Rigoberto Noel, MD, 30 mL at 09/16/19 2239 .  fentaNYL (SUBLIMAZE) injection 50-100 mcg, 50-100 mcg, Intravenous, Q1H PRN, Rigoberto Noel, MD, 100 mcg at 09/17/19 0309 .  folic acid (FOLVITE) tablet 1 mg, 1 mg, Per Tube, Daily, Noemi Chapel P, DO, 1 mg at 09/16/19 1025 .  free water 200 mL, 200 mL, Per Tube, Q4H, Kara Mead V, MD, 200 mL at 09/17/19 0515 .  heparin injection 5,000 Units, 5,000 Units, Subcutaneous, Q8H, Julian Hy, DO, 5,000 Units at 09/17/19 0515 .  hydrALAZINE (APRESOLINE) injection 20 mg, 20 mg, Intravenous, Q6H PRN, Garvin Fila, MD, 20 mg at 09/15/19 1015 .  hydrALAZINE (APRESOLINE) tablet 50 mg, 50 mg, Per Tube, Q8H, Noemi Chapel P, DO, 50 mg at 09/17/19 0514 .  insulin aspart (novoLOG) injection 0-15 Units, 0-15 Units, Subcutaneous, Q4H, Elsie Lincoln, MD, 2 Units at 09/17/19 [phone]  labetalol (NORMODYNE) injection 10-40 mg, 10-40 mg, Intravenous, Q10 min PRN, Garvin Fila, MD, 20 mg at 09/15/19 0234 .  labetalol (NORMODYNE) tablet 200 mg, 200 mg, Per Tube, TID, Garvin Fila, MD, 200 mg at 09/16/19 2239 .  levETIRAcetam (KEPPRA) 100 MG/ML solution 500 mg, 500 mg, Per Tube, BID, Agarwala, Ravi, MD, 500 mg at 09/16/19 2239 .  MEDLINE mouth rinse, 15 mL, Mouth Rinse, 10 times per day, Erline Levine, MD, 15 mL at 09/17/19 0515 .  midazolam (VERSED) injection 2 mg, 2  mg, Intravenous, Q2H PRN, Chesley Mires, MD, 2 mg at 09/17/19 0120 .  multivitamin liquid 15 mL, 15 mL, Per Tube, Daily, Noemi Chapel P, DO, 15 mL at 09/16/19 1025 .  [DISCONTINUED] ondansetron (ZOFRAN) tablet 4 mg, 4 mg, Oral, Q4H PRN **OR** ondansetron (ZOFRAN) injection 4 mg, 4 mg, Intravenous, Q4H PRN, Erline Levine, MD .  pantoprazole sodium (PROTONIX) 40 mg/20 mL oral suspension 40 mg, 40 mg, Per Tube, BID, Jennelle Human B, NP, 40 mg at 09/16/19 2239 .  polyethylene glycol (MIRALAX / GLYCOLAX) packet 17 g, 17 g, Oral, Daily PRN, Erline Levine, MD .  promethazine (PHENERGAN) tablet 12.5-25 mg, 12.5-25 mg, Oral, Q4H PRN, Erline Levine, MD .  sodium chloride flush (NS) 0.9 % injection 10-40 mL, 10-40 mL, Intracatheter, Q12H, Erline Levine, MD, 20 mL at 09/16/19 2239 .  sodium chloride flush (NS) 0.9 % injection 10-40 mL, 10-40 mL, Intracatheter, PRN, Erline Levine, MD .  thiamine (VITAMIN B-1) tablet 100 mg, 100 mg, Per Tube, Daily, Julian Hy, DO, 100 mg at 09/16/19 1025   Physical Exam: Intubated, eyes open to voice with sedation held, localizing on the right, some withdrawal on left, not FC Head wrap d/c'd, flap full but not tense, incision c/d/i  Assessment & Plan: 38 y.o. 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  -no change in neurosurgical plan of care  Judith Part  09/17/19 8:42 AM

## 2019-09-18 ENCOUNTER — Inpatient Hospital Stay (HOSPITAL_COMMUNITY): Payer: Medicaid Other

## 2019-09-18 DIAGNOSIS — R1312 Dysphagia, oropharyngeal phase: Secondary | ICD-10-CM

## 2019-09-18 LAB — CBC WITH DIFFERENTIAL/PLATELET
Abs Immature Granulocytes: 0.16 10*3/uL — ABNORMAL HIGH (ref 0.00–0.07)
Basophils Absolute: 0 10*3/uL (ref 0.0–0.1)
Basophils Relative: 0 %
Eosinophils Absolute: 0.2 10*3/uL (ref 0.0–0.5)
Eosinophils Relative: 2 %
HCT: 24.2 % — ABNORMAL LOW (ref 39.0–52.0)
Hemoglobin: 7.6 g/dL — ABNORMAL LOW (ref 13.0–17.0)
Immature Granulocytes: 2 %
Lymphocytes Relative: 13 %
Lymphs Abs: 1.3 10*3/uL (ref 0.7–4.0)
MCH: 30.3 pg (ref 26.0–34.0)
MCHC: 31.4 g/dL (ref 30.0–36.0)
MCV: 96.4 fL (ref 80.0–100.0)
Monocytes Absolute: 0.6 10*3/uL (ref 0.1–1.0)
Monocytes Relative: 6 %
Neutro Abs: 7.7 10*3/uL (ref 1.7–7.7)
Neutrophils Relative %: 77 %
Platelets: 254 10*3/uL (ref 150–400)
RBC: 2.51 MIL/uL — ABNORMAL LOW (ref 4.22–5.81)
RDW: 15.6 % — ABNORMAL HIGH (ref 11.5–15.5)
WBC: 10 10*3/uL (ref 4.0–10.5)
nRBC: 0.9 % — ABNORMAL HIGH (ref 0.0–0.2)

## 2019-09-18 LAB — GLUCOSE, CAPILLARY
Glucose-Capillary: 133 mg/dL — ABNORMAL HIGH (ref 70–99)
Glucose-Capillary: 153 mg/dL — ABNORMAL HIGH (ref 70–99)
Glucose-Capillary: 116 mg/dL — ABNORMAL HIGH (ref 70–99)
Glucose-Capillary: 120 mg/dL — ABNORMAL HIGH (ref 70–99)
Glucose-Capillary: 115 mg/dL — ABNORMAL HIGH (ref 70–99)
Glucose-Capillary: 113 mg/dL — ABNORMAL HIGH (ref 70–99)

## 2019-09-18 LAB — BASIC METABOLIC PANEL
Anion gap: 10 (ref 5–15)
BUN: 39 mg/dL — ABNORMAL HIGH (ref 6–20)
CO2: 25 mmol/L (ref 22–32)
Calcium: 8.5 mg/dL — ABNORMAL LOW (ref 8.9–10.3)
Chloride: 116 mmol/L — ABNORMAL HIGH (ref 98–111)
Creatinine, Ser: 2.16 mg/dL — ABNORMAL HIGH (ref 0.61–1.24)
GFR calc Af Amer: 43 mL/min — ABNORMAL LOW (ref >60)
GFR calc non Af Amer: 37 mL/min — ABNORMAL LOW (ref >60)
Glucose, Bld: 138 mg/dL — ABNORMAL HIGH (ref 70–99)
Potassium: 3.7 mmol/L (ref 3.5–5.1)
Sodium: 151 mmol/L — ABNORMAL HIGH (ref 135–145)

## 2019-09-18 LAB — PHOSPHORUS: Phosphorus: 4.6 mg/dL (ref 2.5–4.6)

## 2019-09-18 LAB — MAGNESIUM: Magnesium: 2.5 mg/dL — ABNORMAL HIGH (ref 1.7–2.4)

## 2019-09-18 NOTE — Progress Notes (Signed)
Neurosurgery Service Progress Note  Subjective: No acute events overnight   Objective: Vitals:   09/18/19 1133 09/18/19 1200 09/18/19 1300 09/18/19 1400  BP: (!) 128/91 121/80 (!) 124/97 (!) 135/102  Pulse: 71 74 75 85  Resp: '18 17 17 ' (!) 21  Temp:      TempSrc:      SpO2: 99% 99% 99% 99%  Weight:      Height:       Temp (24hrs), Avg:98.6 F (37 C), Min:98.3 F (36.8 C), Max:98.8 F (37.1 C)  CBC Latest Ref Rng & Units 09/18/2019 09/17/2019 09/16/2019  WBC 4.0 - 10.5 K/uL 10.0 9.3 10.2  Hemoglobin 13.0 - 17.0 g/dL 7.6(L) 7.6(L) 7.6(L)  Hematocrit 39.0 - 52.0 % 24.2(L) 24.6(L) 24.6(L)  Platelets 150 - 400 K/uL 254 225 201   BMP Latest Ref Rng & Units 09/17/2019 09/17/2019 09/16/2019  Glucose 70 - 99 mg/dL 138(H) 126(H) 172(H)  BUN 6 - 20 mg/dL 39(H) 35(H) 37(H)  Creatinine 0.61 - 1.24 mg/dL 2.16(H) 2.18(H) 2.44(H)  Sodium 135 - 145 mmol/L 151(H) 157(H) 156(H)  Potassium 3.5 - 5.1 mmol/L 3.7 4.3 3.8  Chloride 98 - 111 mmol/L 116(H) 126(H) 123(H)  CO2 22 - 32 mmol/L '25 25 24  ' Calcium 8.9 - 10.3 mg/dL 8.5(L) 8.9 8.7(L)    Intake/Output Summary (Last 24 hours) at 09/18/2019 1444 Last data filed at 09/18/2019 1200 Gross per 24 hour  Intake 1561.17 ml  Output 3550 ml  Net -1988.83 ml    Current Facility-Administered Medications:  .  acetaminophen (TYLENOL) tablet 650 mg, 650 mg, Per Tube, Q4H PRN, 650 mg at 09/15/19 1018 **OR** acetaminophen (TYLENOL) suppository 650 mg, 650 mg, Rectal, Q4H PRN, Leonie Man, Pramod S, MD .  amLODipine (NORVASC) tablet 10 mg, 10 mg, Per Tube, Daily, Agarwala, Ravi, MD, 10 mg at 09/18/19 0942 .  bisacodyl (DULCOLAX) suppository 10 mg, 10 mg, Rectal, Daily PRN, Erline Levine, MD .  chlorhexidine gluconate (MEDLINE KIT) (PERIDEX) 0.12 % solution 15 mL, 15 mL, Mouth Rinse, BID, Erline Levine, MD, 15 mL at 09/18/19 0743 .  Chlorhexidine Gluconate Cloth 2 % PADS 6 each, 6 each, Topical, Daily, Anders Simmonds, MD, 6 each at 09/17/19 2123 .  dexmedetomidine  (PRECEDEX) 400 MCG/100ML (4 mcg/mL) infusion, 0-1.2 mcg/kg/hr, Intravenous, Titrated, Rosalin Hawking, MD, Last Rate: 19.2 mL/hr at 09/18/19 0700, 1.2 mcg/kg/hr at 09/18/19 0700 .  feeding supplement (OSMOLITE 1.5 CAL) liquid 1,000 mL, 1,000 mL, Per Tube, Continuous, Rigoberto Noel, MD, Last Rate: 55 mL/hr at 09/17/19 0616, 1,000 mL at 09/17/19 0616 .  feeding supplement (PRO-STAT SUGAR FREE 64) liquid 30 mL, 30 mL, Per Tube, BID, Rigoberto Noel, MD, 30 mL at 09/18/19 0942 .  fentaNYL (SUBLIMAZE) injection 50-100 mcg, 50-100 mcg, Intravenous, Q1H PRN, Rigoberto Noel, MD, 100 mcg at 09/18/19 0200 .  folic acid (FOLVITE) tablet 1 mg, 1 mg, Per Tube, Daily, Noemi Chapel P, DO, 1 mg at 09/18/19 0947 .  free water 200 mL, 200 mL, Per Tube, Q4H, Kara Mead V, MD, 200 mL at 09/18/19 1325 .  heparin injection 5,000 Units, 5,000 Units, Subcutaneous, Q8H, Julian Hy, DO, 5,000 Units at 09/18/19 0504 .  hydrALAZINE (APRESOLINE) injection 20 mg, 20 mg, Intravenous, Q6H PRN, Garvin Fila, MD, 20 mg at 09/15/19 1015 .  hydrALAZINE (APRESOLINE) tablet 50 mg, 50 mg, Per Tube, Q8H, Noemi Chapel P, DO, 50 mg at 09/18/19 0504 .  insulin aspart (novoLOG) injection 0-15 Units, 0-15 Units, Subcutaneous, Q4H, Elsie Lincoln,  MD, 3 Units at 09/18/19 0913 .  labetalol (NORMODYNE) injection 10-40 mg, 10-40 mg, Intravenous, Q10 min PRN, Garvin Fila, MD, 20 mg at 09/15/19 0234 .  labetalol (NORMODYNE) tablet 200 mg, 200 mg, Per Tube, TID, Garvin Fila, MD, 200 mg at 09/18/19 0942 .  levETIRAcetam (KEPPRA) 100 MG/ML solution 500 mg, 500 mg, Per Tube, BID, Agarwala, Ravi, MD, 500 mg at 09/18/19 0942 .  MEDLINE mouth rinse, 15 mL, Mouth Rinse, 10 times per day, Erline Levine, MD, 15 mL at 09/18/19 1443 .  midazolam (VERSED) injection 2 mg, 2 mg, Intravenous, Q2H PRN, Chesley Mires, MD, 2 mg at 09/18/19 0300 .  multivitamin liquid 15 mL, 15 mL, Per Tube, Daily, Noemi Chapel P, DO, 15 mL at 09/18/19 0942 .  [DISCONTINUED]  ondansetron (ZOFRAN) tablet 4 mg, 4 mg, Oral, Q4H PRN **OR** ondansetron (ZOFRAN) injection 4 mg, 4 mg, Intravenous, Q4H PRN, Erline Levine, MD .  pantoprazole sodium (PROTONIX) 40 mg/20 mL oral suspension 40 mg, 40 mg, Per Tube, BID, Jennelle Human B, NP, 40 mg at 09/18/19 0942 .  polyethylene glycol (MIRALAX / GLYCOLAX) packet 17 g, 17 g, Oral, Daily PRN, Erline Levine, MD .  promethazine (PHENERGAN) tablet 12.5-25 mg, 12.5-25 mg, Oral, Q4H PRN, Erline Levine, MD .  sodium chloride flush (NS) 0.9 % injection 10-40 mL, 10-40 mL, Intracatheter, Q12H, Erline Levine, MD, 10 mL at 09/18/19 0943 .  sodium chloride flush (NS) 0.9 % injection 10-40 mL, 10-40 mL, Intracatheter, PRN, Erline Levine, MD .  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  -neurologically stable, exam as expected given hemorrhage location, flap reassuring. Will sign off at this time, please call with any new questions or concerns. Patient should have his staples removed on POD14 and follow up with Dr. Vertell Limber after discharge from his inpatient facility.   Judith Part  09/18/19 2:44 PM

## 2019-09-18 NOTE — Progress Notes (Signed)
PULMONARY / CRITICAL CARE MEDICINE   NAME:  Joshua [name], MRN:  [id number], DOB:  06-08-1981, LOS: 9 ADMISSION DATE:  09/09/2019, CONSULTATION DATE: 09/09/2019 REFERRING MD: Neurosurgery, CHIEF COMPLAINT: Vent dependent respiratory failure secondary to intracranial hemorrhage BRIEF HISTORY:    38 year old history of alcohol abuse seizures noncompliant hypertension who presented with new seizure right-sided hemiplegia confusion was found to have a large left intracranial hemorrhage for which he underwent craniectomy.   SIGNIFICANT PAST MEDICAL HISTORY   Hypertension Alcohol abuse  SIGNIFICANT EVENTS:  09/08/2021 Admitted/  operating room for left frontal temporal craniectomy and evacuation hematoma 9/14 off hypertonic saline, fevers/ started on abx  9/15 ? Withdrawals?, started on precedex, cleviprex off overnight, weaned 8 hrs on SBT 5/5  9/16 decreasing Hgb, continues to wean well- mental status remains barrier 9/18 extubated >> reintubated due to protect airway and low oxygen    STUDIES:   CT of head 09/09/2019 >> 1. 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 2. Intraventricular extension of hemorrhage. Small focus of intraparenchymal hemorrhage in the right basal ganglia.  CT of head 09/10/2019-decreased size of left basal ganglia hematoma with expected postoperative air.  Mild improvement of associated mass-effect.  Redistribution of intraventricular blood.  [place] 9/16 >>  1.  Stable changes post left frontoparietal craniectomy for evacuation of left-sided central hemorrhagic stroke. Residual without significant change measuring 2.8 x 6.5 cm with less air within the hemorrhage. Stable postsurgical change with mild edema/mass effect and stable midline shift to the right of 5-6 mm. 2.  Decrease in density 4 mm focus of hemorrhage over the right basal ganglia.  Renal US 9/16  >> 1. Mildly echogenic kidneys likely related to underlying medical renal disease. 2. Mild bilateral hydronephrosis.  No shadowing stone.  CULTURES:  09/09/2019 sputum>>normal flora 9/12 MRSA PCR >> neg 9/12 COVID >> neg 9/14 sputum>> normal flora  ANTIBIOTICS:  vanc 9/14>> 9/16 Pip-tazo 9/14>>9/17 Unasyn 9/17 >>9/21  LINES/TUBES:  09/09/2019 ETT >> 9/18 reintubated >> 09/09/2019 orogastric tube >> 9/14 09/09/2019 right radial A-line >> 9/15 9/14 L cortrak >>  CONSULTANTS:  09/09/2019 pulmonary critical care 09/09/2019 neurology 09/09/2019 NSGY  SUBJECTIVE:   Afebrile Strong cough when Precedex drip lowered Good urine output   CONSTITUTIONAL: BP (!) 124/94   Pulse 68   Temp 98.3 F (36.8 C) (Axillary)   Resp 18   Ht 5\' 9"  (1.753 m)   Wt 66.1 kg   SpO2 100%   BMI 21.52 kg/m   I/O last 3 completed shifts: In: 3993.9 [I.V.:600.1; NG/GT:3150; IV Piggyback:243.8] Out: [id number] [Urine:4125; Stool:100]  Vent Mode: PSV;CPAP FiO2 (%):  [40 %] 40 % Set Rate:  [16 bmp] 16 bmp Vt Set:  [560 mL] 560 mL PEEP:  [5 cmH20] 5 cmH20 Pressure Support:  [10 cmH20] 10 cmH20 Plateau Pressure:  [15 cmH20-22 cmH20] 18 cmH20    PHYSICAL EXAM: General Appearance:  Looks criticall ill , well built and nourished, no distress, intermittent agitation Head wrap removed, incision clear dry ENT -mild pallor, no icterus, oral ET tube Neck: No JVD, no thyromegaly, no carotid bruits Lungs: no use of accessory muscles, no dullness to percussion, clear without rales or rhonchi , mild secretions Cardiovascular: Rhythm regular, heart sounds  normal, no murmurs or gallops, no peripheral edema Abdomen: soft and non-tender, no hepatosplenomegaly, BS normal. Musculoskeletal: No deformities, no cyanosis or clubbing Neuro: More purposeful on left, strong cough, does  not follow commands  Chest x-ray 9/21 personally reviewed right basal infiltrate stable  Labs show improving creatinine and hyponatremia,  no leukocytosis, stable anemia     RESOLVED PROBLEM LIST  Thrombocytopenia  ASSESSMENT AND PLAN    Acute hypoxic respiratory failure with compromised airway in setting of ICH.   Plan  -Continue spontaneous breathing trials -May need tracheostomy ultimately but perhaps deserves another trial of extubation  Aspiration pneumonia.  Treated with 8 days of antibiotics - dc unasyn  Lt BG ICH with midline shift and subfalcine herniation complicated by cerebral edema and seizures.- no change CT head 09/17/2019 - s/p Lt decompressive craniectomy with evacuation of hematoma - continue keppra  Hypertension. - goal SBP < 160 - continue norvasc, hydralazine, labetalol  AKI -unclear baseline sCr, suspected CKD Mild hypokalemia Mild hyperchloremic acidosis - ACEi stopped 9/14.  - improved creat 09/17/2019 - FeNa suggestive prerenal, renal US c/w CKD, mild hydroneprhosis without stone - f/u BMET  Hypernatremia- initially iatrogenic with hypertonic saline, stopped 9/14  - okay to let sodium normalize now  Polysubstance/ ETOH abuse - UDS negative.   - Reportedly drinks [place] 6 shots to 1pt a day - continue thiamine, folic acid, MVI - goal RASS 0 to -1 with precedex  Hyperglycemia - SSI  Anemia of critical illness , s/p 1U PRBC 9/17 - f/u CBC - transfuse for Hb < 7 or significant bleeding  Best Practice / Goals of Care / Disposition.    DVT PROPHYLAXIS: SCDs, [place] heparin  GI prophylaxis: PPI BID per tube Pain agitation: RASS goal 0/-1, precedex, prn fentanyl NUTRITION: NPO/  tube feedings at goal  MOBILITY: Bedrest GOALS OF CARE: Full code Foley catheter: condom catheter FAMILY DISCUSSIONS: per primary team DISPOSITION neuro ICU   ATTESTATION & SIGNATURE    The patient is critically ill with multiple organ systems failure and requires high complexity decision making for assessment and support, frequent evaluation and titration of therapies, application of advanced  monitoring technologies and extensive interpretation of multiple databases. Critical Care Time devoted to patient care services described in this note independent of APP/resident  time is 32 minutes.    Kara Mead MD. Shade Flood. [facility] Pager [phone] If no response call [phone]   09/18/2019

## 2019-09-18 NOTE — Progress Notes (Signed)
RT note: patient placed on CPAP/PSV of 10/5 at 0850.  Currently tolerating well.  Will continue to monitor.

## 2019-09-18 NOTE — Progress Notes (Signed)
STROKE TEAM PROGRESS NOTE   INTERVAL HISTORY Patient on weaning trial now, tolerating well. Still on precedex ,vital stable, no fever. Continue weaning to see if extubatable in the next few days.   OBJECTIVE Vitals:   09/18/19 0500 09/18/19 0530 09/18/19 0600 09/18/19 0700  BP: (!) 136/109 124/88 116/90 (!) 132/107  Pulse: 71 69 64 61  Resp: 16 16 16 16   Temp:      TempSrc:      SpO2: 100% 98% 99% 100%  Weight: 66.1 kg     Height:        CBC:  Recent Labs  Lab 09/17/19 0519 09/18/19 0500  WBC 9.3 10.0  NEUTROABS  --  7.7  HGB 7.6* 7.6*  HCT 24.6* 24.2*  MCV 96.5 96.4  PLT 225 0000000    Basic Metabolic Panel:  Recent Labs  Lab 09/16/19 0613 09/17/19 0519 09/17/19 2356 09/18/19 0500  NA 156* 157* 151*  --   K 3.8 4.3 3.7  --   CL 123* 126* 116*  --   CO2 24 25 25   --   GLUCOSE 172* 126* 138*  --   BUN 37* 35* 39*  --   CREATININE 2.44* 2.18* 2.16*  --   CALCIUM 8.7* 8.9 8.5*  --   MG 2.6*  --   --  2.5*  PHOS 3.8  --   --  4.6    Lipid Panel:     Component Value Date/Time   CHOL 134 09/16/2019 0613   TRIG 101 09/16/2019 0613   HDL 28 (L) 09/16/2019 0613   CHOLHDL 4.8 09/16/2019 0613   VLDL 20 09/16/2019 0613   LDLCALC 86 09/16/2019 0613   HgbA1c:  Lab Results  Component Value Date   HGBA1C 5.6 09/10/2019   Urine Drug Screen:     Component Value Date/Time   LABOPIA NONE DETECTED 09/09/2019 0349   COCAINSCRNUR NONE DETECTED 09/09/2019 0349   LABBENZ NONE DETECTED 09/09/2019 0349   AMPHETMU NONE DETECTED 09/09/2019 0349   THCU NONE DETECTED 09/09/2019 0349   LABBARB NONE DETECTED 09/09/2019 0349    Alcohol Level     Component Value Date/Time   ETH <10 09/09/2019 0407    IMAGING  Ct Head Code Stroke Wo Contrast 09/09/2019 1. 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  2. Intraventricular extension of hemorrhage. Small focus of  intraparenchymal hemorrhage in the right basal ganglia.   CT head  CTA Head  09/10/2019 1. Status post left pterional craniectomy for evacuation of left basal ganglia hematoma. Residual hematoma with interspersed postoperative gas measures 6.1 x 2.9 cm. 2. Decreased midline shift, now measuring 6 mm to the right. 3. Unchanged punctate focus of hemorrhage at the posterior right insula. 4. Unchanged intraventricular blood. 5. No intracranial arterial occlusion or high-grade stenosis. No aneurysm or vascular malformation.   CT head  09/13/2019 Stable changes post left frontoparietal craniectomy for evacuation of left-sided central hemorrhagic stroke. Residual without significant change measuring 2.8 x 6.5 cm with less air within the hemorrhage. Stable postsurgical change with mild edema/mass effect and stable midline shift to the right of 5-6 mm. Decrease in density 4 mm focus of hemorrhage over the right basal ganglia.  CT Head W/O Contrast 09/17/19 IMPRESSION: 1. Unchanged size of intraparenchymal hematoma centered in the left basal ganglia with unchanged surrounding edema and mass effect. 2. Unchanged rightward midline shift, measuring 6 mm.  Dg Chest Port 1 View 09/15/2019 1.  Lines and tubes in good anatomic position. 2.  Progressive right lower lobe atelectasis/infiltrate. 09/10/2019 Resolving right midlung atelectasis. Tubes and lines as described. 09/09/2019 No acute cardiopulmonary abnormality. Appropriate positioning of the endotracheal and transesophageal tubes.   DG Chest Port 1 View 09/17/19 IMPRESSION: 1. Lungs now clear. 2. No new abnormalities. 3. Stable support apparatus.  Renal US 09/13/2019 1. Mildly echogenic kidneys likely related to underlying medical renal disease. 2. Mild bilateral hydronephrosis.  No shadowing stone.  ECG - SR rate 97 BPM. LVH and prolonged QT interval. (See cardiology reading for complete details)  Transthoracic Echocardiogram   09/16/2019 IMPRESSIONS 1. Left ventricular ejection fraction, by visual estimation, is 70 to 75%. The left ventricle has hyperdynamic function. Normal left ventricular size. There is severely increased left ventricular hypertrophy.  2. Global right ventricle has normal systolic function.The right ventricular size is normal. No increase in right ventricular wall thickness.  3. Left atrial size was normal.  4. Right atrial size was moderately dilated.  5. The mitral valve is grossly normal. Mild mitral valve regurgitation.  6. The tricuspid valve is grossly normal. Tricuspid valve regurgitation is trivial.  7. The aortic valve is tricuspid Aortic valve regurgitation was not visualized by color flow Doppler.  8. The pulmonic valve was grossly normal. Pulmonic valve regurgitation is not visualized by color flow Doppler.  9. The inferior vena cava was not able to be adequately assessed to estimate CVP.   PHYSICAL EXAM  Temp:  [97.2 F (36.2 C)-98.8 F (37.1 C)] 98.3 F (36.8 C) (09/21 0400) Pulse Rate:  [61-112] 61 (09/21 0700) Resp:  [16-31] 16 (09/21 0700) BP: (115-144)/(80-109) 132/107 (09/21 0700) SpO2:  [96 %-100 %] 100 % (09/21 0700) FiO2 (%):  [40 %] 40 % (09/21 0400) Weight:  [66.1 kg] 66.1 kg (09/21 0500)  General - Well nourished, well developed, intubated on precedex.  Ophthalmologic - fundi not visualized due to noncooperation.  Cardiovascular - Regular rate and rhythm.  Neuro - intubated on precedex, eyes open, able to track on the left but not following commands. Eyes in left gaze preference, not blinking to visual threat on the right, doll's eyes sluggish, pupils 97mm reactive to light. Corneal reflex present bilaterally, gag and cough present. Breathing over the vent, on weaning trial.  Facial symmetry not able to test due to ET tube.  Tongue protrusion not cooperative. Spontaneous movement on the left. On pain stimulation, against gravity on the left but not on the right.  RUE and RLE slight withdraw on pain. Increased muscle tone on right UE and LE. DTR 3+ on the right and positive babinski on the right. Sensation, coordination and gait not tested.   ASSESSMENT/PLAN Mr. Jashon Overend is a 38 y.o. male with history of admission for Etoh poisoning presenting with a seizure, vomiting.   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 In addition, small focus of R basal ganglia ICH.   NSG - decompressive craniectomy and hematoma evacuation w/ flap in R abd - 09/09/19 Dr Vertell Limber  Code Stroke CT Head - Large intraparenchymal hematoma of the left basal ganglia measuring 7.8 x 4.8 cm with 14 mm of rightward midline shift and early subfalcine herniation. Intraventricular extension of hemorrhage. Small focus of intraparenchymal hemorrhage in the right basal ganglia.   CT/CTA head - 09/10/2019 - Status post left pterional craniectomy for evacuation  of left basal ganglia hematoma. Residual hematoma with interspersed postoperative gas measures 6.1 x 2.9 cm. Decreased midline shift, now measuring 6 mm to the right. No intracranial arterial occlusion or high-grade stenosis. No aneurysm or vascular malformation  Ct head 9/16 and 9/20 stable crani. Stable 5-6 midline shift. Decreased R basal ganglia hemorrhage   HgbA1c 5.6  LDL 86  TTE 09/16/19 EF 70 to 75%. There is severely increased left ventricular hypertrophy.  [employer] Virus 2 - negative  UDS - negative  VTE prophylaxis - Heparin 5000 units sq tid   No antithrombotic prior to admission, now on No antithrombotic d/t hemorrhage   Therapy recommendations:  pending  Disposition:  Pending  Cerebral Edema Comatose state  S/p crani and hematoma evacuation  Treated with 3% saline protocol, now off  Na - 158->161->157->156->157->151  Now Free water 200 q4h   CT repeat 09/17/19 - Unchanged size of intraparenchymal hematoma  and surrounding edema and mass effect.   Respiratory Failure  Secondary to [place]  Off sedation and extubated 9/18  Re-intubated 9/18 due to low O2 sat and not protect airway  Now on precedex  On weaning trial, tolerating well  CCM on board for potential extubation later  VAP vs Aspiration PNA (vomited PTA) Leukocytosis, Febrile  WBC - 24.3->18.8->10.5->10.2->9.3->10.0  T Max - 101.7->afebrile  U/A neg 9/14  Blood cultures normal flora  CXR progressive RLL atx/infilatrate -> CXR - 09/16/19 neg  D/c Vanc, d/c Zosyn -> Unasyn (started 9/17)  Hypertensive Emergency  Probable undiagnosed / untreated hypertension (severe LVH on ECG)  Home BP meds: none  Current BP meds: amlodipine 10, hydralazine 50 tid, labetolol 200 tid    Off Lisinopril D/t increasing Cr  SBP goal < 160 mm Hg     Off cleviprex    Seizure  On presentation  Hx of one seizure previously  On Keppra 500 mg IV Bid  AKI on CKD stage III  ACE stopped 9/14.   Renal US mild echogenic kidneys.   Creatinine - 2.80->2.64->2.62->2.18->2.16  continue IVF and free water  Alcohol abuse  Hx ETOH abuse  recent hospitalization for alcohol poisoning.   Drinks 1 pt Hennessey per day.   On FA/MVI/B1  Placed on CIWA protocol.  Hyperlipidemia  Home Lipid lowering medication: none  LDL - 86  Current lipid lowering medication: None  May consider statin at discharge  Dysphagia Malnutrition . Secondary to stroke . NPO  Has cortrak and tube feeding @ 55  Anemia  Hgb 14.5- 10.7- 8.5 - 7.3 - 6.9 - PRBC - 7.8->7.1->7.6->7.6  Unclear etiology  On PPI BID  Close CBC monitoring  Other Stroke Risk Factors  Polysubstance abuse, UDS - negative 09/09/19  Other Active Problems  Hypokalemia 3.6->3.4 - replaced ->4.3->3.9->3.8->4.3->3.7  Mild Thrombocytopenia - 147->124->172->201 (resolved)->254  Hypocalcemia - 8.7->8.4->8.6->8.7->8.9->8.5  Hospital day # 9  This patient is critically  ill and at significant risk of neurological worsening, death and care requires constant monitoring of vital signs, hemodynamics,respiratory and cardiac monitoring, extensive review of multiple databases, frequent neurological assessment, discussion with family, other specialists and medical decision making of high complexity. I spent 35 minutes of neurocritical care time  in the care of  this patient. Discussed with Dr. Elsworth Soho.  Rosalin Hawking, MD PhD Stroke Neurology 09/18/2019 9:57 AM    To contact Stroke Continuity provider, please refer to [email or web address] After hours, contact General Neurology

## 2019-09-19 LAB — CBC WITH DIFFERENTIAL/PLATELET
Abs Immature Granulocytes: 0.19 10*3/uL — ABNORMAL HIGH (ref 0.00–0.07)
Basophils Absolute: 0 10*3/uL (ref 0.0–0.1)
Basophils Relative: 0 %
Eosinophils Absolute: 0.3 10*3/uL (ref 0.0–0.5)
Eosinophils Relative: 3 %
HCT: 24.5 % — ABNORMAL LOW (ref 39.0–52.0)
Hemoglobin: 7.8 g/dL — ABNORMAL LOW (ref 13.0–17.0)
Immature Granulocytes: 2 %
Lymphocytes Relative: 12 %
Lymphs Abs: 1.2 10*3/uL (ref 0.7–4.0)
MCH: 30.6 pg (ref 26.0–34.0)
MCHC: 31.8 g/dL (ref 30.0–36.0)
MCV: 96.1 fL (ref 80.0–100.0)
Monocytes Absolute: 0.7 10*3/uL (ref 0.1–1.0)
Monocytes Relative: 7 %
Neutro Abs: 7.3 10*3/uL (ref 1.7–7.7)
Neutrophils Relative %: 76 %
Platelets: 295 10*3/uL (ref 150–400)
RBC: 2.55 MIL/uL — ABNORMAL LOW (ref 4.22–5.81)
RDW: 15.3 % (ref 11.5–15.5)
WBC: 9.6 10*3/uL (ref 4.0–10.5)
nRBC: 0.4 % — ABNORMAL HIGH (ref 0.0–0.2)

## 2019-09-19 LAB — MAGNESIUM: Magnesium: 2.4 mg/dL (ref 1.7–2.4)

## 2019-09-19 LAB — GLUCOSE, CAPILLARY
Glucose-Capillary: 136 mg/dL — ABNORMAL HIGH (ref 70–99)
Glucose-Capillary: 117 mg/dL — ABNORMAL HIGH (ref 70–99)
Glucose-Capillary: 130 mg/dL — ABNORMAL HIGH (ref 70–99)
Glucose-Capillary: 121 mg/dL — ABNORMAL HIGH (ref 70–99)
Glucose-Capillary: 109 mg/dL — ABNORMAL HIGH (ref 70–99)
Glucose-Capillary: 133 mg/dL — ABNORMAL HIGH (ref 70–99)

## 2019-09-19 LAB — BASIC METABOLIC PANEL
Anion gap: 9 (ref 5–15)
BUN: 39 mg/dL — ABNORMAL HIGH (ref 6–20)
CO2: 25 mmol/L (ref 22–32)
Calcium: 8.8 mg/dL — ABNORMAL LOW (ref 8.9–10.3)
Chloride: 119 mmol/L — ABNORMAL HIGH (ref 98–111)
Creatinine, Ser: 2.15 mg/dL — ABNORMAL HIGH (ref 0.61–1.24)
GFR calc Af Amer: 43 mL/min — ABNORMAL LOW (ref >60)
GFR calc non Af Amer: 37 mL/min — ABNORMAL LOW (ref >60)
Glucose, Bld: 133 mg/dL — ABNORMAL HIGH (ref 70–99)
Potassium: 4.2 mmol/L (ref 3.5–5.1)
Sodium: 153 mmol/L — ABNORMAL HIGH (ref 135–145)

## 2019-09-19 LAB — PHOSPHORUS: Phosphorus: 5.1 mg/dL — ABNORMAL HIGH (ref 2.5–4.6)

## 2019-09-19 NOTE — Progress Notes (Signed)
Discussed removing PICC line with MD this morning during rounds. Decision was made to keep PICC until tracheostomy later this week.

## 2019-09-19 NOTE — Progress Notes (Signed)
PULMONARY / CRITICAL CARE MEDICINE   NAME:  Joshua [name], MRN:  [id number], DOB:  1981/06/05, LOS: 38 ADMISSION DATE:  09/09/2019, CONSULTATION DATE: 09/09/2019 REFERRING MD: Neurosurgery, CHIEF COMPLAINT: Vent dependent respiratory failure secondary to intracranial hemorrhage BRIEF HISTORY:    38 year old history of alcohol abuse seizures noncompliant hypertension who presented with new seizure right-sided hemiplegia confusion was found to have a large left intracranial hemorrhage for which he underwent craniectomy.   SIGNIFICANT PAST MEDICAL HISTORY   Hypertension Alcohol abuse  SIGNIFICANT EVENTS:  09/08/2021 Admitted/  operating room for left frontal temporal craniectomy and evacuation hematoma 9/14 off hypertonic saline, fevers/ started on abx  9/15 ? Withdrawals?, started on precedex, cleviprex off overnight, weaned 8 hrs on SBT 5/5  9/16 decreasing Hgb, continues to wean well- mental status remains barrier 9/18 extubated >> reintubated due to protect airway and low oxygen    STUDIES:   CT of head 09/09/2019 >> 1. 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 2. Intraventricular extension of hemorrhage. Small focus of intraparenchymal hemorrhage in the right basal ganglia.  CT of head 09/10/2019-decreased size of left basal ganglia hematoma with expected postoperative air.  Mild improvement of associated mass-effect.  Redistribution of intraventricular blood.  [place] 9/16 >>  1.  Stable changes post left frontoparietal craniectomy for evacuation of left-sided central hemorrhagic stroke. Residual without significant change measuring 2.8 x 6.5 cm with less air within the hemorrhage. Stable postsurgical change with mild edema/mass effect and stable midline shift to the right of 5-6 mm. 2.  Decrease in density 4 mm focus of hemorrhage over the right basal ganglia.  Renal US 9/16  >> 1. Mildly echogenic kidneys likely related to underlying medical renal disease. 2. Mild bilateral hydronephrosis.  No shadowing stone.  CULTURES:  09/09/2019 sputum>>normal flora 9/12 MRSA PCR >> neg 9/12 COVID >> neg 9/14 sputum>> normal flora  ANTIBIOTICS:  vanc 9/14>> 9/16 Pip-tazo 9/14>>9/17 Unasyn 9/17 >>9/21  LINES/TUBES:  09/09/2019 ETT >> 9/18 reintubated >> 09/09/2019 orogastric tube >> 9/14 09/09/2019 right radial A-line >> 9/15 9/14 L cortrak >>  CONSULTANTS:  09/09/2019 pulmonary critical care 09/09/2019 neurology 09/09/2019 NSGY  SUBJECTIVE:   Remains critically ill, afebrile Good urine output Strong cough   CONSTITUTIONAL: BP 124/78 (BP Location: Right Arm)   Pulse 79   Temp 98.5 F (36.9 C) (Axillary)   Resp 16   Ht 5\' 9"  (1.753 m)   Wt 66.1 kg   SpO2 100%   BMI 21.52 kg/m   I/O last 3 completed shifts: In: 3780.9 [I.V.:655.9; NG/GT:3125] Out: [id number] [Urine:4900; Stool:350]  Vent Mode: CPAP;PSV FiO2 (%):  [40 %] 40 % Set Rate:  [16 bmp] 16 bmp Vt Set:  [560 mL] 560 mL PEEP:  [5 cmH20] 5 cmH20 Pressure Support:  [5 cmH20-8 cmH20] 5 cmH20 Plateau Pressure:  [13 cmH20-19 cmH20] 19 cmH20    PHYSICAL EXAM: General Appearance:  well built and nourished, no distress, intermittent agitation Head wrap removed, incision clear dry ENT -mild pallor, no icterus, oral ET tube Neck: No JVD, no thyromegaly, no carotid bruits Lungs: no use of accessory muscles, no dullness to percussion, clear without rales or rhonchi , mild secretions Cardiovascular: Rhythm regular, heart sounds  normal, no murmurs or gallops, no peripheral edema Abdomen: soft and non-tender, no hepatosplenomegaly, BS normal. Musculoskeletal: No deformities, no cyanosis or clubbing Neuro:  purposeful on left, strong cough, does not follow commands  Chest x-ray 9/21 personally reviewed right basal infiltrate stable   Labs show rising sodium, stable creatinine, no leukocytosis, stable  anemia     RESOLVED PROBLEM LIST  Thrombocytopenia  ASSESSMENT AND PLAN    Acute hypoxic respiratory failure with compromised airway in setting of ICH.   Plan  -Tolerates spontaneous breathing trials, main issue here is airway protection -Proceed with another trial of extubation, failed certainly will need tracheostomy  Aspiration pneumonia.  Treated with 8 days of antibiotics -Afebrile off antibiotics  Lt BG ICH with midline shift and subfalcine herniation complicated by cerebral edema and seizures.- no change CT head 09/17/2019 - s/p Lt decompressive craniectomy with evacuation of hematoma, remove staples on day 14 - continue keppra  Hypertension. - goal SBP < 160 - continue norvasc, hydralazine, labetalol  AKI -unclear baseline sCr, seems to have settled at 2.1, suspected CKD - FeNa suggestive prerenal, renal US c/w CKD, mild hydroneprhosis without stone Mild hyperchloremic acidosis - ACEi stopped 9/14.   - f/u BMET  Hypernatremia- initially iatrogenic with hypertonic saline, stopped 9/14  - okay to let sodium normalize now with free water  Polysubstance/ ETOH abuse - UDS negative.   - Reportedly drinks Hennessey 6 shots to 1pt a day - continue thiamine, folic acid, MVI - goal RASS 0 to -1 with precedex  Hyperglycemia - SSI  Anemia of critical illness , s/p 1U PRBC 9/17 - f/u CBC - transfuse for Hb < 7 or significant bleeding  Best Practice / Goals of Care / Disposition.    DVT PROPHYLAXIS: SCDs, [place] heparin  GI prophylaxis: PPI BID per tube Pain agitation: RASS goal 0/-1, precedex, prn fentanyl NUTRITION: NPO/  tube feedings at goal  MOBILITY: Bedrest GOALS OF CARE: Full code Foley catheter: condom catheter FAMILY DISCUSSIONS: Sister and fianc updated 9/21 DISPOSITION neuro ICU   ATTESTATION & SIGNATURE    The patient is critically ill with multiple organ systems failure and requires high complexity decision making for assessment and support,  frequent evaluation and titration of therapies, application of advanced monitoring technologies and extensive interpretation of multiple databases. Critical Care Time devoted to patient care services described in this note independent of APP/resident  time is 34 minutes.      Kara Mead MD. [place] Flood. [place] [facility] Pager [phone] If no response call [phone]   09/19/2019

## 2019-09-19 NOTE — Progress Notes (Signed)
STROKE TEAM PROGRESS NOTE   INTERVAL HISTORY Pt lying in bed, still intubated, no neuro change. CCM is planning for attempt of extubation. Currently tapering precedex.   OBJECTIVE Vitals:   09/19/19 0700 09/19/19 0740 09/19/19 0800 09/19/19 0900  BP: 118/86 118/86 124/78 118/89  Pulse: 77 77 79 87  Resp: 16 16 16  (!) 31  Temp:   98.5 F (36.9 C)   TempSrc:   Axillary   SpO2: 100% 100% 100% 96%  Weight:      Height:        CBC:  Recent Labs  Lab 09/18/19 0500 09/19/19 0500  WBC 10.0 9.6  NEUTROABS 7.7 7.3  HGB 7.6* 7.8*  HCT 24.2* 24.5*  MCV 96.4 96.1  PLT 254 [id number]    Basic Metabolic Panel:  Recent Labs  Lab 09/17/19 2356 09/18/19 0500 09/19/19 0500  NA 151*  --  153*  K 3.7  --  4.2  CL 116*  --  119*  CO2 25  --  25  GLUCOSE 138*  --  133*  BUN 39*  --  39*  CREATININE 2.16*  --  2.15*  CALCIUM 8.5*  --  8.8*  MG  --  2.5* 2.4  PHOS  --  4.6 5.1*    Lipid Panel:     Component Value Date/Time   CHOL 134 09/16/2019 0613   TRIG 101 09/16/2019 0613   HDL 28 (L) 09/16/2019 0613   CHOLHDL 4.8 09/16/2019 0613   VLDL 20 09/16/2019 0613   LDLCALC 86 09/16/2019 0613   HgbA1c:  Lab Results  Component Value Date   HGBA1C 5.6 09/10/2019   Urine Drug Screen:     Component Value Date/Time   LABOPIA NONE DETECTED 09/09/2019 0349   COCAINSCRNUR NONE DETECTED 09/09/2019 0349   LABBENZ NONE DETECTED 09/09/2019 0349   AMPHETMU NONE DETECTED 09/09/2019 0349   THCU NONE DETECTED 09/09/2019 0349   LABBARB NONE DETECTED 09/09/2019 0349    Alcohol Level     Component Value Date/Time   ETH <10 09/09/2019 0407    IMAGING  Ct Head Code Stroke Wo Contrast 09/09/2019 1. 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  2. Intraventricular extension of hemorrhage. Small focus of intraparenchymal hemorrhage in the right basal ganglia.   CT head  CTA Head   09/10/2019 1. Status post left pterional craniectomy for evacuation of left basal ganglia hematoma. Residual hematoma with interspersed postoperative gas measures 6.1 x 2.9 cm. 2. Decreased midline shift, now measuring 6 mm to the right. 3. Unchanged punctate focus of hemorrhage at the posterior right insula. 4. Unchanged intraventricular blood. 5. No intracranial arterial occlusion or high-grade stenosis. No aneurysm or vascular malformation.   CT head  09/13/2019 Stable changes post left frontoparietal craniectomy for evacuation of left-sided central hemorrhagic stroke. Residual without significant change measuring 2.8 x 6.5 cm with less air within the hemorrhage. Stable postsurgical change with mild edema/mass effect and stable midline shift to the right of 5-6 mm. Decrease in density 4 mm focus of hemorrhage over the right basal ganglia.  CT Head W/O Contrast 09/17/19 IMPRESSION: 1. Unchanged size of intraparenchymal hematoma centered in the left basal ganglia with unchanged surrounding edema and mass effect. 2. Unchanged rightward midline shift, measuring 6 mm.  Dg Chest Port 1 View 09/18/2019 1. Endotracheal tube, feeding tube in stable position. PICC line tip noted over the right atrium.  2.  Stable cardiomegaly. 3.  Low lung volumes with right base atelectasis/infiltrate. 09/17/2019 1. Lungs now clear. 2. No new abnormalities. 3. Stable support apparatus. 09/16/2019 1. No acute cardiopulmonary disease. Right lung base atelectasis has improved. 2. Support apparatus is stable and well positioned. 09/15/2019 1.  Lines and tubes in good anatomic position. 2.  Progressive right lower lobe atelectasis/infiltrate. 09/10/2019 Resolving right midlung atelectasis. Tubes and lines as described. 09/09/2019 No acute cardiopulmonary abnormality. Appropriate positioning of the endotracheal and transesophageal tubes.   Renal US 09/13/2019 1. Mildly echogenic kidneys likely related to underlying  medical renal disease. 2. Mild bilateral hydronephrosis.  No shadowing stone.  ECG - SR rate 97 BPM. LVH and prolonged QT interval. (See cardiology reading for complete details) Transthoracic Echocardiogram  09/16/2019 IMPRESSIONS 1. Left ventricular ejection fraction, by visual estimation, is 70 to 75%. The left ventricle has hyperdynamic function. Normal left ventricular size. There is severely increased left ventricular hypertrophy.  2. Global right ventricle has normal systolic function.The right ventricular size is normal. No increase in right ventricular wall thickness.  3. Left atrial size was normal.  4. Right atrial size was moderately dilated.  5. The mitral valve is grossly normal. Mild mitral valve regurgitation.  6. The tricuspid valve is grossly normal. Tricuspid valve regurgitation is trivial.  7. The aortic valve is tricuspid Aortic valve regurgitation was not visualized by color flow Doppler.  8. The pulmonic valve was grossly normal. Pulmonic valve regurgitation is not visualized by color flow Doppler.  9. The inferior vena cava was not able to be adequately assessed to estimate CVP.   PHYSICAL EXAM   Temp:  [98.3 F (36.8 C)-98.7 F (37.1 C)] 98.5 F (36.9 C) (09/22 0800) Pulse Rate:  [67-93] 87 (09/22 0900) Resp:  [15-31] 31 (09/22 0900) BP: (117-151)/(77-110) 118/89 (09/22 0900) SpO2:  [94 %-100 %] 96 % (09/22 0900) FiO2 (%):  [40 %] 40 % (09/22 0800)  General - Well nourished, well developed, intubated on precedex.  Ophthalmologic - fundi not visualized due to noncooperation.  Cardiovascular - Regular rate and rhythm.  Neuro - intubated on precedex, eyes open, able to track on the left but not following commands. Eyes in left gaze preference, not blinking to visual threat on the right, doll's eyes sluggish, pupils 43mm reactive to light. Corneal reflex present bilaterally, gag and cough present. Breathing over the vent, on weaning trial.  Facial symmetry not  able to test due to ET tube.  Tongue protrusion not cooperative. Spontaneous movement on the left. On pain stimulation, against gravity on the left but not on the right. RUE and RLE slight withdraw on pain. Increased muscle tone on right UE and LE. DTR 3+ on the right and positive babinski on the right. Sensation, coordination and gait not tested.   ASSESSMENT/PLAN Mr. Bristol Ginley is a 38 y.o. male with history of admission for Etoh poisoning presenting with a seizure, vomiting.   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 In addition, small focus of R basal ganglia ICH.   NSG - decompressive craniectomy and hematoma evacuation w/ flap in R abd - 09/09/19 Dr Vertell Limber  Code Stroke CT Head - Large intraparenchymal hematoma of the left basal ganglia measuring 7.8 x 4.8 cm with 14 mm of rightward midline shift and early subfalcine herniation. Intraventricular extension of hemorrhage. Small focus of intraparenchymal hemorrhage in the right basal ganglia.  CT/CTA head - 09/10/2019 - Status post left pterional craniectomy for evacuation of left basal ganglia hematoma. Residual hematoma with interspersed postoperative gas measures 6.1 x 2.9 cm. Decreased midline shift, now measuring 6 mm to the right. No intracranial arterial occlusion or high-grade stenosis. No aneurysm or vascular malformation  Ct head 9/16 and 9/20 stable crani. Stable 5-6 midline shift. Decreased R basal ganglia hemorrhage   HgbA1c 5.6  LDL 86  TTE 09/16/19 EF 70 to 75%. There is severely increased left ventricular hypertrophy.  [employer] Virus 2 - negative  UDS - negative  VTE prophylaxis - Heparin 5000 units sq tid   No antithrombotic prior to admission, now on No antithrombotic d/t hemorrhage   Therapy recommendations:  pending  Disposition:  Pending  Cerebral Edema Comatose state  S/p crani and hematoma  evacuation  Treated with 3% saline protocol, now off  Na - 158->161->157->156->157->151->153  Now Free water 200 q4h   CT repeat 09/17/19 - Unchanged size of intraparenchymal hematoma and surrounding edema and mass effect.   Respiratory Failure  Secondary to [place]  Off sedation and extubated 9/18  Re-intubated 9/18 due to low O2 sat and not protect airway  Now on precedex, weaning   On weaning trial, tolerating well  CCM on board for potential extubation soon  VAP vs Aspiration PNA (vomited PTA) Leukocytosis, Febrile  WBC - 24.3->18.8->10.5->10.2->9.3->10.0->9.6  T Max - 101.7->afebrile  U/A neg 9/14  Blood cultures normal flora  CXR progressive RLL atx/infilatrate -> CXR - 09/16/19 neg  D/c Vanc, d/c Zosyn -> Unasyn 9/17-9/20  Hypertensive Emergency  Probable undiagnosed / untreated hypertension (severe LVH on ECG)  Home BP meds: none  Current BP meds: amlodipine 10, hydralazine 50 tid, labetolol 200 tid    Off Lisinopril D/t increasing Cr  SBP goal < 160 mm Hg     Off cleviprex    Seizure  On presentation  Hx of one seizure previously  On Keppra 500 mg Bid  AKI on CKD stage III  ACE stopped 9/14.   Renal US mild echogenic kidneys.   Creatinine - 2.80->2.64->2.62->2.18->2.16->2.15  continue IVF and free water  Alcohol abuse  Hx ETOH abuse  recent hospitalization for alcohol poisoning.   Drinks 1 pt Hennessey per day.   On FA/MVI/B1  On CIWA protocol.  Hyperlipidemia  Home Lipid lowering medication: none  LDL - 86  Current lipid lowering medication: None  May consider statin at discharge  Dysphagia Malnutrition . Secondary to stroke . NPO  Has cortrak and tube feeding @ 55  Anemia  Hgb 14.5- 10.7- 8.5 - 7.3 - 6.9 - PRBC - 7.8->7.1->7.6->7.6->7.8  Unclear etiology  On PPI BID  Close CBC monitoring  Other Stroke Risk Factors  Polysubstance abuse, UDS - negative 09/09/19  Other Active Problems  Hypokalemia,  resolved 3.6->3.4 - replaced ->4.3->3.9->3.8->4.3->3.7->4.2  Mild Thrombocytopenia - 147->124->172->201 (resolved)->254->295  Hypocalcemia - 8.7->8.4->8.6->8.7->8.9->8.5->8.8  Hospital day # 10  This patient is critically ill and at significant risk of neurological worsening, death and care requires constant monitoring of vital signs, hemodynamics,respiratory and cardiac monitoring, extensive review of multiple databases, frequent neurological assessment, discussion with family, other specialists and medical decision making of high complexity. I spent 30 minutes of neurocritical care time  in the care of  this patient. Discussed with Dr. Elsworth Soho.  Rosalin Hawking, MD PhD Stroke Neurology 09/19/2019 10:23 AM    To contact Stroke Continuity provider, please refer to [email or web address] After hours, contact General Neurology

## 2019-09-20 LAB — APTT: aPTT: 42 seconds — ABNORMAL HIGH (ref 24–36)

## 2019-09-20 LAB — BASIC METABOLIC PANEL
Anion gap: 10 (ref 5–15)
BUN: 43 mg/dL — ABNORMAL HIGH (ref 6–20)
CO2: 26 mmol/L (ref 22–32)
Calcium: 9.3 mg/dL (ref 8.9–10.3)
Chloride: 116 mmol/L — ABNORMAL HIGH (ref 98–111)
Creatinine, Ser: 2.38 mg/dL — ABNORMAL HIGH (ref 0.61–1.24)
GFR calc Af Amer: 38 mL/min — ABNORMAL LOW (ref >60)
GFR calc non Af Amer: 33 mL/min — ABNORMAL LOW (ref >60)
Glucose, Bld: 128 mg/dL — ABNORMAL HIGH (ref 70–99)
Potassium: 4.4 mmol/L (ref 3.5–5.1)
Sodium: 152 mmol/L — ABNORMAL HIGH (ref 135–145)

## 2019-09-20 LAB — PROTIME-INR
INR: 1.2 (ref 0.8–1.2)
Prothrombin Time: 15.2 seconds (ref 11.4–15.2)

## 2019-09-20 LAB — GLUCOSE, CAPILLARY
Glucose-Capillary: 145 mg/dL — ABNORMAL HIGH (ref 70–99)
Glucose-Capillary: 123 mg/dL — ABNORMAL HIGH (ref 70–99)
Glucose-Capillary: 111 mg/dL — ABNORMAL HIGH (ref 70–99)
Glucose-Capillary: 108 mg/dL — ABNORMAL HIGH (ref 70–99)
Glucose-Capillary: 131 mg/dL — ABNORMAL HIGH (ref 70–99)
Glucose-Capillary: 121 mg/dL — ABNORMAL HIGH (ref 70–99)

## 2019-09-20 LAB — CBC WITH DIFFERENTIAL/PLATELET
Abs Immature Granulocytes: 0.31 10*3/uL — ABNORMAL HIGH (ref 0.00–0.07)
Basophils Absolute: 0.1 10*3/uL (ref 0.0–0.1)
Basophils Relative: 0 %
Eosinophils Absolute: 0.2 10*3/uL (ref 0.0–0.5)
Eosinophils Relative: 1 %
HCT: 28.2 % — ABNORMAL LOW (ref 39.0–52.0)
Hemoglobin: 8.5 g/dL — ABNORMAL LOW (ref 13.0–17.0)
Immature Granulocytes: 2 %
Lymphocytes Relative: 13 %
Lymphs Abs: 2.2 10*3/uL (ref 0.7–4.0)
MCH: 29.5 pg (ref 26.0–34.0)
MCHC: 30.1 g/dL (ref 30.0–36.0)
MCV: 97.9 fL (ref 80.0–100.0)
Monocytes Absolute: 1.2 10*3/uL — ABNORMAL HIGH (ref 0.1–1.0)
Monocytes Relative: 7 %
Neutro Abs: 12.4 10*3/uL — ABNORMAL HIGH (ref 1.7–7.7)
Neutrophils Relative %: 77 %
Platelets: 361 10*3/uL (ref 150–400)
RBC: 2.88 MIL/uL — ABNORMAL LOW (ref 4.22–5.81)
RDW: 15.1 % (ref 11.5–15.5)
WBC: 16.3 10*3/uL — ABNORMAL HIGH (ref 4.0–10.5)
nRBC: 0.3 % — ABNORMAL HIGH (ref 0.0–0.2)

## 2019-09-20 LAB — PHOSPHORUS: Phosphorus: 5.8 mg/dL — ABNORMAL HIGH (ref 2.5–4.6)

## 2019-09-20 LAB — MAGNESIUM: Magnesium: 2.5 mg/dL — ABNORMAL HIGH (ref 1.7–2.4)

## 2019-09-20 MED ORDER — MIDAZOLAM HCL 2 MG/2ML IJ SOLN: 5.0000 mg | Freq: Once | INTRAMUSCULAR | Status: DC

## 2019-09-20 MED ORDER — MIDAZOLAM HCL 2 MG/2ML IJ SOLN
INTRAMUSCULAR | Status: AC
  Filled 2019-09-20: qty 2

## 2019-09-20 MED ORDER — MIDAZOLAM HCL 2 MG/2ML IJ SOLN: 1.0000 mg | INTRAMUSCULAR | Status: DC | PRN

## 2019-09-20 MED ORDER — VECURONIUM BROMIDE 10 MG IV SOLR: 10.0000 mg | Freq: Once | INTRAVENOUS | Status: DC

## 2019-09-20 MED ORDER — FENTANYL CITRATE (PF) 100 MCG/2ML IJ SOLN: 200.0000 ug | Freq: Once | INTRAMUSCULAR | Status: DC

## 2019-09-20 MED ORDER — PROPOFOL 10 MG/ML IV BOLUS: 500.0000 mg | Freq: Once | INTRAVENOUS | Status: DC

## 2019-09-20 MED ORDER — MIDAZOLAM HCL 2 MG/2ML IJ SOLN
1.0000 mg | Freq: Once | INTRAMUSCULAR | Status: AC
  Administered 2019-09-20: 06:00:00 1 mg via INTRAVENOUS

## 2019-09-20 MED ORDER — ETOMIDATE 2 MG/ML IV SOLN: 40.0000 mg | Freq: Once | INTRAVENOUS | Status: DC

## 2019-09-20 NOTE — Progress Notes (Signed)
Patient extubated and placed on 4L [place]. MD at bedside. Tolerating well so far. Will resume TF's in 4 hours if no issues. Everhett Bozard, Rande Brunt, RN

## 2019-09-20 NOTE — Progress Notes (Signed)
PULMONARY / CRITICAL CARE MEDICINE   NAME:  Joshua [name], MRN:  [id number], DOB:  Nov 03, 1981, LOS: 27 ADMISSION DATE:  09/09/2019, CONSULTATION DATE: 09/09/2019 REFERRING MD: Neurosurgery, CHIEF COMPLAINT: Vent dependent respiratory failure secondary to intracranial hemorrhage BRIEF HISTORY:    38 year old history of alcohol abuse seizures noncompliant hypertension who presented with new seizure right-sided hemiplegia confusion was found to have a large left intracranial hemorrhage for which he underwent craniectomy.   SIGNIFICANT PAST MEDICAL HISTORY   Hypertension Alcohol abuse  SIGNIFICANT EVENTS:  09/08/2021 Admitted/  operating room for left frontal temporal craniectomy and evacuation hematoma 9/14 off hypertonic saline, fevers/ started on abx  9/15 ? Withdrawals?, started on precedex, cleviprex off overnight, weaned 8 hrs on SBT 5/5  9/16 decreasing Hgb, continues to wean well- mental status remains barrier 9/18 extubated >> reintubated due to protect airway and low oxygen    STUDIES:   CT of head 09/09/2019 >> 1. 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 2. Intraventricular extension of hemorrhage. Small focus of intraparenchymal hemorrhage in the right basal ganglia.  CT of head 09/10/2019-decreased size of left basal ganglia hematoma with expected postoperative air.  Mild improvement of associated mass-effect.  Redistribution of intraventricular blood.  [place] 9/16 >>  1.  Stable changes post left frontoparietal craniectomy for evacuation of left-sided central hemorrhagic stroke. Residual without significant change measuring 2.8 x 6.5 cm with less air within the hemorrhage. Stable postsurgical change with mild edema/mass effect and stable midline shift to the right of 5-6 mm. 2.  Decrease in density 4 mm focus of hemorrhage over the right basal ganglia.  Renal US 9/16  >> 1. Mildly echogenic kidneys likely related to underlying medical renal disease. 2. Mild bilateral hydronephrosis.  No shadowing stone.  CULTURES:  09/09/2019 sputum>>normal flora 9/12 MRSA PCR >> neg 9/12 COVID >> neg 9/14 sputum>> normal flora  ANTIBIOTICS:  vanc 9/14>> 9/16 Pip-tazo 9/14>>9/17 Unasyn 9/17 >>9/21  LINES/TUBES:  09/09/2019 ETT >> 9/18 reintubated >> 09/09/2019 orogastric tube >> 9/14 09/09/2019 right radial A-line >> 9/15 9/14 L cortrak >>  CONSULTANTS:  09/09/2019 pulmonary critical care 09/09/2019 neurology 09/09/2019 NSGY  SUBJECTIVE:   Remains critically ill, intubated Intermittent agitation overnight requiring Versed and increased dosing of Precedex Good urine output    CONSTITUTIONAL: BP (!) 127/91   Pulse 70   Temp 98.3 F (36.8 C) (Axillary)   Resp 13   Ht 5\' 9"  (1.753 m)   Wt 66.1 kg   SpO2 98%   BMI 21.52 kg/m   I/O last 3 completed shifts: In: 5109.5 [I.V.:869.5; NG/GT:4240] Out: 5100 [Urine:4475; Stool:625]  Vent Mode: PRVC FiO2 (%):  [30 %-40 %] 30 % Set Rate:  [16 bmp] 16 bmp Vt Set:  [560 mL] 560 mL PEEP:  [5 cmH20] 5 cmH20 Pressure Support:  [5 cmH20] 5 cmH20 Plateau Pressure:  [17 cmH20-19 cmH20] 19 cmH20    PHYSICAL EXAM: General Appearance:  well built and nourished, no distress, intermittent agitation Head wrap removed, incision clear dry ENT -mild pallor, no icterus, oral ET tube , bite block Neck: No JVD, no thyromegaly, no carotid bruits Lungs: no use of accessory muscles, no dullness to percussion, clear without rales or rhonchi , mild secretions Cardiovascular: Rhythm regular, heart sounds  normal, no murmurs or gallops, no peripheral edema Abdomen: soft and non-tender, no hepatosplenomegaly, BS normal. Musculoskeletal: No deformities, no cyanosis or clubbing Neuro: Sedated,  RA SS -21 Precedex, purposeful on left, strong cough, does not follow commands  Chest x-ray 9/21 personally reviewed which shows low  lung volumes with right basilar infiltrate   Labs show stable sodium, slight increase in creatinine, leukocytosis     RESOLVED PROBLEM LIST  Thrombocytopenia  ASSESSMENT AND PLAN    Acute hypoxic respiratory failure with compromised airway in setting of ICH.   Plan  -Tolerates spontaneous breathing trials, main issue here is airway protection -Plan is for tracheostomy on 9/24  Aspiration pneumonia.  Treated with 8 days of antibiotics -Afebrile off antibiotics, but WBC rising again, will follow  Lt BG ICH with midline shift and subfalcine herniation complicated by cerebral edema and seizures.- no change CT head 09/17/2019 - s/p Lt decompressive craniectomy with evacuation of hematoma, remove staples on day 14 - continue keppra  Hypertension. - goal SBP < 160 - continue norvasc, hydralazine, labetalol  AKI -unclear baseline sCr ? 2.1, suspect CKD - FeNa suggestive prerenal, renal US c/w CKD, mild hydroneprhosis without stone Mild hyperchloremic acidosis - ACEi stopped 9/14.   - f/u BMET  Hypernatremia- initially iatrogenic with hypertonic saline, stopped 9/14  - okay to let sodium normalize now with free water  Acute encephalopathy polysubstance/ ETOH abuse - UDS negative.   - Reportedly drinks Hennessey 6 shots to 1pt a day - continue thiamine, folic acid, MVI - goal RASS 0 to -1 with precedex -versed/fent  prn  Hyperglycemia - SSI  Anemia of critical illness , s/p 1U PRBC 9/17 - f/u CBC - transfuse for Hb < 7 or significant bleeding  Best Practice / Goals of Care / Disposition.    DVT PROPHYLAXIS: SCDs, [place] heparin  GI prophylaxis: PPI BID per tube Pain agitation: RASS goal 0/-1, precedex, prn fentanyl NUTRITION: NPO/  tube feedings at goal  MOBILITY: Bedrest GOALS OF CARE: Full code Foley catheter: condom catheter FAMILY DISCUSSIONS: Sister and fianc updated 9/22 DISPOSITION neuro ICU   ATTESTATION & SIGNATURE    The patient is critically ill  with multiple organ systems failure and requires high complexity decision making for assessment and support, frequent evaluation and titration of therapies, application of advanced monitoring technologies and extensive interpretation of multiple databases. Critical Care Time devoted to patient care services described in this note independent of APP/resident  time is 31 minutes.       Kara Mead MD. Shade Flood. [facility] Pager [phone] If no response call [phone]   09/20/2019

## 2019-09-20 NOTE — Progress Notes (Signed)
STROKE TEAM PROGRESS NOTE   INTERVAL HISTORY No neuro changes, still intubated.  Discussed with Dr. Elsworth Soho, plan for tracheostomy tomorrow.  OBJECTIVE Vitals:   09/20/19 0600 09/20/19 0700 09/20/19 0740 09/20/19 0800  BP: (!) 138/101 124/81 124/81 (!) 127/91  Pulse: 86 77 62 70  Resp: 16 16 12 13   Temp:    98.3 F (36.8 C)  TempSrc:    Axillary  SpO2: 91% 99% 99% 98%  Weight:      Height:        CBC:  Recent Labs  Lab 09/19/19 0500 09/20/19 0536  WBC 9.6 16.3*  NEUTROABS 7.3 12.4*  HGB 7.8* 8.5*  HCT 24.5* 28.2*  MCV 96.1 97.9  PLT 295 [id number]    Basic Metabolic Panel:  Recent Labs  Lab 09/19/19 0500 09/20/19 0536  NA 153* 152*  K 4.2 4.4  CL 119* 116*  CO2 25 26  GLUCOSE 133* 128*  BUN 39* 43*  CREATININE 2.15* 2.38*  CALCIUM 8.8* 9.3  MG 2.4 2.5*  PHOS 5.1* 5.8*    Lipid Panel:     Component Value Date/Time   CHOL 134 09/16/2019 0613   TRIG 101 09/16/2019 0613   HDL 28 (L) 09/16/2019 0613   CHOLHDL 4.8 09/16/2019 0613   VLDL 20 09/16/2019 0613   LDLCALC 86 09/16/2019 0613   HgbA1c:  Lab Results  Component Value Date   HGBA1C 5.6 09/10/2019   Urine Drug Screen:     Component Value Date/Time   LABOPIA NONE DETECTED 09/09/2019 0349   COCAINSCRNUR NONE DETECTED 09/09/2019 0349   LABBENZ NONE DETECTED 09/09/2019 0349   AMPHETMU NONE DETECTED 09/09/2019 0349   THCU NONE DETECTED 09/09/2019 0349   LABBARB NONE DETECTED 09/09/2019 0349    Alcohol Level     Component Value Date/Time   ETH <10 09/09/2019 0407    IMAGING  Ct Head Code Stroke Wo Contrast 09/09/2019 1. 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  2. Intraventricular extension of hemorrhage. Small focus of intraparenchymal hemorrhage in the right basal ganglia.   CT head  CTA Head  09/10/2019 1. Status post left pterional craniectomy for evacuation of left basal ganglia  hematoma. Residual hematoma with interspersed postoperative gas measures 6.1 x 2.9 cm. 2. Decreased midline shift, now measuring 6 mm to the right. 3. Unchanged punctate focus of hemorrhage at the posterior right insula. 4. Unchanged intraventricular blood. 5. No intracranial arterial occlusion or high-grade stenosis. No aneurysm or vascular malformation.   CT head  09/13/2019 Stable changes post left frontoparietal craniectomy for evacuation of left-sided central hemorrhagic stroke. Residual without significant change measuring 2.8 x 6.5 cm with less air within the hemorrhage. Stable postsurgical change with mild edema/mass effect and stable midline shift to the right of 5-6 mm. Decrease in density 4 mm focus of hemorrhage over the right basal ganglia.  CT Head W/O Contrast 09/17/19 IMPRESSION: 1. Unchanged size of intraparenchymal hematoma centered in the left basal ganglia with unchanged surrounding edema and mass effect. 2. Unchanged rightward midline shift, measuring 6 mm.  Dg Chest Port 1 View 09/18/2019 1. Endotracheal tube, feeding tube in stable position. PICC line tip noted over the right atrium. 2.  Stable cardiomegaly. 3.  Low lung volumes with right base atelectasis/infiltrate. 09/17/2019 1. Lungs now clear. 2. No new abnormalities. 3. Stable support apparatus. 09/16/2019 1. No acute cardiopulmonary disease. Right lung base atelectasis has  improved. 2. Support apparatus is stable and well positioned. 09/15/2019 1.  Lines and tubes in good anatomic position. 2.  Progressive right lower lobe atelectasis/infiltrate. 09/10/2019 Resolving right midlung atelectasis. Tubes and lines as described. 09/09/2019 No acute cardiopulmonary abnormality. Appropriate positioning of the endotracheal and transesophageal tubes.   Renal US 09/13/2019 1. Mildly echogenic kidneys likely related to underlying medical renal disease. 2. Mild bilateral hydronephrosis.  No shadowing stone.  ECG - SR rate  97 BPM. LVH and prolonged QT interval. (See cardiology reading for complete details) Transthoracic Echocardiogram  09/16/2019 IMPRESSIONS 1. Left ventricular ejection fraction, by visual estimation, is 70 to 75%. The left ventricle has hyperdynamic function. Normal left ventricular size. There is severely increased left ventricular hypertrophy.  2. Global right ventricle has normal systolic function.The right ventricular size is normal. No increase in right ventricular wall thickness.  3. Left atrial size was normal.  4. Right atrial size was moderately dilated.  5. The mitral valve is grossly normal. Mild mitral valve regurgitation.  6. The tricuspid valve is grossly normal. Tricuspid valve regurgitation is trivial.  7. The aortic valve is tricuspid Aortic valve regurgitation was not visualized by color flow Doppler.  8. The pulmonic valve was grossly normal. Pulmonic valve regurgitation is not visualized by color flow Doppler.  9. The inferior vena cava was not able to be adequately assessed to estimate CVP.   PHYSICAL EXAM    Temp:  [97.9 F (36.6 C)-100 F (37.8 C)] 98.3 F (36.8 C) (09/23 0800) Pulse Rate:  [62-115] 70 (09/23 0800) Resp:  [12-27] 13 (09/23 0800) BP: (107-148)/(72-103) 127/91 (09/23 0800) SpO2:  [91 %-100 %] 98 % (09/23 0800) FiO2 (%):  [30 %-40 %] 40 % (09/23 0740)  General - Well nourished, well developed, intubated on precedex.  Ophthalmologic - fundi not visualized due to noncooperation.  Cardiovascular - Regular rate and rhythm.  Neuro - intubated on precedex, eyes halfway open, not following commands. Eyes left gaze preference, not blinking to visual threat on the right, doll's eyes sluggish, pupils 4mm reactive to light. Corneal reflex present bilaterally, gag and cough present. Breathing over the vent, on weaning trial.  Facial symmetry not able to test due to ET tube.  Tongue protrusion not cooperative. Spontaneous movement on the left. On pain  stimulation, against gravity on the left but not on the right. RUE and RLE slight withdraw on pain. Increased muscle tone on right UE and LE. DTR 3+ on the right and positive babinski on the right. Sensation, coordination and gait not tested.   ASSESSMENT/PLAN Joshua [name] is a 38 y.o. male with history of admission for Etoh poisoning presenting with a seizure, vomiting.   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 In addition, small focus of R basal ganglia ICH.   NSG - decompressive craniectomy and hematoma evacuation w/ flap in R abd - 09/09/19 Dr Vertell Limber  Code Stroke CT Head - Large intraparenchymal hematoma of the left basal ganglia measuring 7.8 x 4.8 cm with 14 mm of rightward midline shift and early subfalcine herniation. Intraventricular extension of hemorrhage. Small focus of intraparenchymal hemorrhage in the right basal ganglia.   CT/CTA head - 09/10/2019 - Status post left pterional craniectomy for evacuation of left basal ganglia hematoma. Residual hematoma with interspersed postoperative gas measures 6.1 x 2.9 cm. Decreased midline shift, now measuring 6 mm to the right. No intracranial arterial  occlusion or high-grade stenosis. No aneurysm or vascular malformation  Ct head 9/16 and 9/20 stable crani. Stable 5-6 midline shift. Decreased R basal ganglia hemorrhage   HgbA1c 5.6  LDL 86  TTE 09/16/19 EF 70 to 75%. There is severely increased left ventricular hypertrophy.  [employer] Virus 2 - negative  UDS - negative  VTE prophylaxis - Heparin 5000 units sq tid   No antithrombotic prior to admission, now on No antithrombotic d/t hemorrhage   Therapy recommendations:  pending  Disposition:  Pending  Cerebral Edema Comatose state  S/p crani and hematoma evacuation  Treated with 3% saline protocol, now off  Na - 158->161->157->156->157->151->153->152   Now Free water  200 q4h   CT repeat 09/17/19 - Unchanged size of intraparenchymal hematoma and surrounding edema and mass effect.   Respiratory Failure  Secondary to [place]  Off sedation and extubated 9/18  Re-intubated 9/18 due to low O2 sat and not protect airway  Now on precedex, adding prn fentanyl, weaning   On weaning trial, tolerating well  Concern for airway protection,for trach 9/24  CCM on board  VAP vs Aspiration PNA (vomited PTA) Leukocytosis, Febrile  WBC - 24.3->18.8->10.5->10.2->9.3->10.0->9.6->16.3   T Max - 101.7->afebrile ->100   U/A neg 9/14  Blood cultures normal flora  CXR progressive RLL atx/infilatrate -> CXR - 09/16/19 neg  D/c Vanc, d/c Zosyn -> Unasyn 9/17-9/20  CCM on board  Hypertensive Emergency  Probable undiagnosed / untreated hypertension (severe LVH on ECG)  Home BP meds: none  Current BP meds: amlodipine 10, hydralazine 50 tid, labetolol 200 tid    Off Lisinopril D/t increasing Cr  SBP goal < 160 mm Hg     Off cleviprex    Seizure  On presentation  Hx of one seizure previously  On Keppra 500 mg Bid  AKI on CKD stage III  ACE stopped 9/14.   Renal US mild echogenic kidneys c/w CKD  Creatinine - 2.80->2.64->2.62->2.18->2.16->2.15->2.38   continue TF and free water  Alcohol abuse  Hx ETOH abuse  recent hospitalization for alcohol poisoning.   Drinks 1 pt Hennessey per day.   On FA/MVI/B1  On CIWA protocol.  Hyperlipidemia  Home Lipid lowering medication: none  LDL - 86  Current lipid lowering medication: None  May consider statin at discharge  Dysphagia Malnutrition . Secondary to stroke . NPO  Has cortrak and tube feeding @ 55  Anemia of critical illness  Hgb 14.5- 10.7- 8.5 - 7.3 - 6.9 - PRBC - 7.8->7.1->7.6->7.6->7.8->8.5   Unclear etiology  On PPI BID  Close CBC monitoring  Other Stroke Risk Factors  Polysubstance abuse, UDS - negative 09/09/19  Other Active Problems  Hypokalemia, resolved  3.6->3.4 - replaced ->4.3->3.9->3.8->4.3->3.7->4.2->4.4   Mild Thrombocytopenia - 147->124->172->201 (resolved)->254->295->361   Hypocalcemia - 8.7->8.4->8.6->8.7->8.9->8.5->8.8->9.3   Hospital day # 11  This patient is critically ill and at significant risk of neurological worsening, death and care requires constant monitoring of vital signs, hemodynamics,respiratory and cardiac monitoring, extensive review of multiple databases, frequent neurological assessment, discussion with family, other specialists and medical decision making of high complexity. I spent 30 minutes of neurocritical care time  in the care of  this patient. Discussed with Dr. Elsworth Soho.  Joshua Hawking, MD PhD Stroke Neurology 09/20/2019 9:46 AM    To contact Stroke Continuity provider, please refer to [email or web address] After hours, contact General Neurology

## 2019-09-20 NOTE — Procedures (Signed)
Extubation Procedure Note  Patient Details:   Name: Joshua [name] DOB: 1981/04/21 MRN: [id number]   Airway Documentation:    Vent end date: 09/20/19 Vent end time: 1240   Evaluation  O2 sats: stable throughout Complications: No apparent complications Patient did tolerate procedure well. Bilateral Breath Sounds: Rhonchi   No not able to speak at this time due to neurological status.   Patient was extubated to a 4L [place] without any complications, dyspnea or stridor noted. Positive cuff leak.   Joshua [name], Eddie North 09/20/2019, 12:40 PM

## 2019-09-20 NOTE — Progress Notes (Signed)
[facility] Progress Note [name] DOB: 1981-03-30 MRN: [id number]   Date of Service  09/20/2019  HPI/Events of Note  Agitation - biting on ETT.   eICU Interventions  Will order: 1. Versed 1 mg IV X 1 now.  2. Increase ceiling on Precedex IV infusion to 1.7 mcg/kg/hour.      Intervention Category Major Interventions: Delirium, psychosis, severe agitation - evaluation and management  , [place] 09/20/2019, 5:43 AM

## 2019-09-21 ENCOUNTER — Inpatient Hospital Stay (HOSPITAL_COMMUNITY): Payer: Medicaid Other

## 2019-09-21 DIAGNOSIS — I161 Hypertensive emergency: Secondary | ICD-10-CM

## 2019-09-21 LAB — BASIC METABOLIC PANEL
Anion gap: 9 (ref 5–15)
BUN: 46 mg/dL — ABNORMAL HIGH (ref 6–20)
CO2: 26 mmol/L (ref 22–32)
Calcium: 8.9 mg/dL (ref 8.9–10.3)
Chloride: 114 mmol/L — ABNORMAL HIGH (ref 98–111)
Creatinine, Ser: 2.53 mg/dL — ABNORMAL HIGH (ref 0.61–1.24)
GFR calc Af Amer: 36 mL/min — ABNORMAL LOW (ref >60)
GFR calc non Af Amer: 31 mL/min — ABNORMAL LOW (ref >60)
Glucose, Bld: 144 mg/dL — ABNORMAL HIGH (ref 70–99)
Potassium: 4.3 mmol/L (ref 3.5–5.1)
Sodium: 149 mmol/L — ABNORMAL HIGH (ref 135–145)

## 2019-09-21 LAB — CBC WITH DIFFERENTIAL/PLATELET
Abs Immature Granulocytes: 0.12 10*3/uL — ABNORMAL HIGH (ref 0.00–0.07)
Basophils Absolute: 0 10*3/uL (ref 0.0–0.1)
Basophils Relative: 0 %
Eosinophils Absolute: 0.2 10*3/uL (ref 0.0–0.5)
Eosinophils Relative: 1 %
HCT: 23.6 % — ABNORMAL LOW (ref 39.0–52.0)
Hemoglobin: 7.8 g/dL — ABNORMAL LOW (ref 13.0–17.0)
Immature Granulocytes: 1 %
Lymphocytes Relative: 11 %
Lymphs Abs: 1.3 10*3/uL (ref 0.7–4.0)
MCH: 30.1 pg (ref 26.0–34.0)
MCHC: 33.1 g/dL (ref 30.0–36.0)
MCV: 91.1 fL (ref 80.0–100.0)
Monocytes Absolute: 0.8 10*3/uL (ref 0.1–1.0)
Monocytes Relative: 7 %
Neutro Abs: 9 10*3/uL — ABNORMAL HIGH (ref 1.7–7.7)
Neutrophils Relative %: 80 %
Platelets: 316 10*3/uL (ref 150–400)
RBC: 2.59 MIL/uL — ABNORMAL LOW (ref 4.22–5.81)
RDW: 14.6 % (ref 11.5–15.5)
WBC: 11.3 10*3/uL — ABNORMAL HIGH (ref 4.0–10.5)
nRBC: 0 % (ref 0.0–0.2)

## 2019-09-21 LAB — GLUCOSE, CAPILLARY
Glucose-Capillary: 119 mg/dL — ABNORMAL HIGH (ref 70–99)
Glucose-Capillary: 145 mg/dL — ABNORMAL HIGH (ref 70–99)
Glucose-Capillary: 107 mg/dL — ABNORMAL HIGH (ref 70–99)
Glucose-Capillary: 126 mg/dL — ABNORMAL HIGH (ref 70–99)
Glucose-Capillary: 108 mg/dL — ABNORMAL HIGH (ref 70–99)
Glucose-Capillary: 126 mg/dL — ABNORMAL HIGH (ref 70–99)

## 2019-09-21 LAB — MAGNESIUM: Magnesium: 2.3 mg/dL (ref 1.7–2.4)

## 2019-09-21 LAB — PHOSPHORUS: Phosphorus: 4.5 mg/dL (ref 2.5–4.6)

## 2019-09-21 MED ORDER — ALTEPLASE 2 MG IJ SOLR: 2.0000 mg | Freq: Once | INTRAMUSCULAR | Status: DC

## 2019-09-21 MED ORDER — QUETIAPINE FUMARATE 25 MG PO TABS
25.0000 mg | ORAL_TABLET | Freq: Two times a day (BID) | ORAL | Status: DC
  Administered 2019-09-21 – 2019-09-22 (×3): 25 mg via ORAL
  Filled 2019-09-21 (×3): qty 1

## 2019-09-21 NOTE — Progress Notes (Signed)
Subjective: Patient reports nonverbal  Objective: Vital signs in last 24 hours: Temp:  [97.8 F (36.6 C)-101 F (38.3 C)] 97.8 F (36.6 C) (09/24 0700) Pulse Rate:  [71-115] 88 (09/24 0700) Resp:  [13-29] 21 (09/24 0700) BP: (111-147)/(71-104) 111/79 (09/24 0700) SpO2:  [94 %-99 %] 98 % (09/24 0700) FiO2 (%):  [40 %] 40 % (09/23 1122)  Intake/Output from previous day: 09/23 0701 - 09/24 0700 In: 2107 [I.V.:371.1; NG/GT:1735.9] Out: 3735 [Urine:3685; Stool:50] Intake/Output this shift: No intake/output data recorded.  Physical Exam: Extubated, protecting airway.  Not following commands.  Spontaneous movement left side.  Does move right to stim.  Incsions CDI.  Lab Results: Recent Labs    09/20/19 0536 09/21/19 0450  WBC 16.3* 11.3*  HGB 8.5* 7.8*  HCT 28.2* 23.6*  PLT 361 316   BMET Recent Labs    09/20/19 0536 09/21/19 0450  NA 152* 149*  K 4.4 4.3  CL 116* 114*  CO2 26 26  GLUCOSE 128* 144*  BUN 43* 46*  CREATININE 2.38* 2.53*  CALCIUM 9.3 8.9    Studies/Results: No results found.  Assessment/Plan: Extubated.  No need for trach.  Continue supportive care.  Aphasic.    LOS: 12 days    Peggyann Shoals, MD 09/21/2019, 8:26 AM

## 2019-09-21 NOTE — Progress Notes (Addendum)
PULMONARY / CRITICAL CARE MEDICINE   NAME:  Joshua [name], MRN:  [id number], DOB:  [birth year], LOS: 12 ADMISSION DATE:  09/09/2019, CONSULTATION DATE: 09/09/2019 REFERRING MD: Neurosurgery, CHIEF COMPLAINT: Vent dependent respiratory failure secondary to intracranial hemorrhage BRIEF HISTORY:    38 year old history of alcohol abuse seizures noncompliant hypertension who presented with new seizure right-sided hemiplegia confusion was found to have a large left intracranial hemorrhage for which he underwent craniectomy.  SIGNIFICANT PAST MEDICAL HISTORY   Hypertension Alcohol abuse  SIGNIFICANT EVENTS:  9/12 Admitted/  operating room for left frontal temporal craniectomy and evacuation hematoma 9/14 off hypertonic saline, fevers/ started on abx  9/15 ? Withdrawals?, started on precedex, cleviprex off overnight, weaned 8 hrs on SBT 5/5  9/16 decreasing Hgb, continues to wean well- mental status remains barrier 9/18 extubated > reintubated due to protect airway and low oxygen 9/23 > Extubated   STUDIES:   CT of head 09/09/2019 >1. 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 2. Intraventricular extension of hemorrhage. Small focus of intraparenchymal hemorrhage in the right basal ganglia. CT of head 9/13 > decreased size of left basal ganglia hematoma with expected postoperative air.  Mild improvement of associated mass-effect.  Redistribution of intraventricular blood. [place] 9/16 > 1.  Stable changes post left frontoparietal craniectomy for evacuation of left-sided central hemorrhagic stroke. Residual without significant change measuring 2.8 x 6.5 cm with less air within the hemorrhage. Stable postsurgical change with mild edema/mass effect and stable midline shift to the right of 5-6 mm. 2.  Decrease in density 4 mm focus of hemorrhage over the right basal ganglia. Renal US 9/16 > 1. Mildly  echogenic kidneys likely related to underlying medical renal disease. 2. Mild bilateral hydronephrosis.  No shadowing stone.  CULTURES:  09/09/2019 sputum>normal flora 9/12 MRSA PCR > neg 9/12 COVID > neg 9/14 sputum> normal flora  ANTIBIOTICS:  Vanc 9/14> 9/16 Pip-tazo 9/14>9/17 Unasyn 9/17 >9/21  LINES/TUBES:  ETT > 9/12- 9/18 re-intubated 9/18-9/23 Right radial A-line 9/12 > 9/15 L cortrak 9/14 >> Right Brachial PICC 9/14 >>   CONSULTANTS:  09/09/2019 pulmonary critical care 09/09/2019 neurology 09/09/2019 NSGY  SUBJECTIVE:  Extubated 9/23. Tmax 101 overnight.This AM protecting airway. Does not follow commands   CONSTITUTIONAL: BP 111/79   Pulse 88   Temp 97.8 F (36.6 C) (Axillary)   Resp (!) 21   Ht 5\' 9"  (1.753 m)   Wt 66.1 kg   SpO2 98%   BMI 21.52 kg/m   I/O last 3 completed shifts: In: 3606.8 [I.V.:610.9; NG/GT:2995.9] Out: 5310 [Urine:4935; Stool:375]  Vent Mode: PSV;CPAP FiO2 (%):  [40 %] 40 % PEEP:  [5 cmH20] 5 cmH20 Pressure Support:  [10 cmH20] 10 cmH20    PHYSICAL EXAM: General Appearance: young adult male, no distress  HEENT: small bore NG in place, surgical site noted to right lateral skull  Lungs: clear breath sounds, no wheeze/crackles  Cardiovascular: RRR, no MRG  Abdomen: soft and non-tender, active bowel sounds  Musculoskeletal: -edema  Neuro: alert, pupils intact, does not follow commands, withdrawals on left, slight movement noted to right upper and lower    RESOLVED PROBLEM LIST  Thrombocytopenia  ASSESSMENT AND PLAN    Respiratory Insufficieny in setting of ICH Acute Hypoxic Respiratory Distress in setting of Aspiration PNA >> Resolving, s/p 8 days of Antibiotics  Plan -Extubated 9/23, continues to protect airway with no distress if fails will need  tracheostomy  -Continue Pulmonary Hygiene  Lt BG ICH with midline shift and subfalcine herniation complicated by cerebral edema and seizures s/p Craniectomy with evacuation   -Treated with 3%, now off  Plan  -Neurology Following  -Remove staples on day 14 -Continue Keppra BID  -PT/OT/ST when able   Hypertensive Emergency  Plan  -Cardiac Monitoring  -goal SBP < 160 -continue norvasc, hydralazine, labetalol  Acute on Chronic Kidney Disease Stage 3 - FeNa suggestive prerenal, renal US c/w CKD, mild hydroneprhosis without stone Mild hyperchloremic acidosis >> Improving  Hypernatremia- initially iatrogenic with hypertonic saline, stopped 9/14 >> Improving  Plan -Trend BMP  -Continue Free Water 200 q4h  Polysubstance/ ETOH abuse - UDS negative - Reportedly drinks Hennessey 6 shots to 1pt a day Plan  - continue thiamine, folic acid, MVI - goal RASS 0 to -1 with precedex  Dysphagia  Plan -continue TF   Hyperglycemia Plan - SSI  Anemia of critical illness , s/p 1U PRBC 9/17 Plan - f/u CBC - transfuse for Hb < 7 or significant bleeding  Best Practice / Goals of Care / Disposition.    DVT PROPHYLAXIS: SCDs, [place] heparin  GI prophylaxis: PPI BID per tube Pain agitation: RASS goal 0/-1, precedex NUTRITION: NPO/ tube feedings at goal  MOBILITY: Bedrest GOALS OF CARE: Full code Foley catheter: condom catheter FAMILY DISCUSSIONS: Sister and fianc updated DISPOSITION neuro ICU  Hayden Pedro, AGACNP-BC [place]  Pgr: [phone]  PCCM Pgr: [phone]

## 2019-09-21 NOTE — Progress Notes (Signed)
STROKE TEAM PROGRESS NOTE   INTERVAL HISTORY Pt girlfriend at bedside. Pt was extubated yesterday afternoon. So far tolerating well. Still has global aphasia. Right hemiplegia. Still on precedex.   OBJECTIVE Vitals:   09/21/19 0500 09/21/19 0538 09/21/19 0600 09/21/19 0700  BP: 119/82 115/82 117/85 111/79  Pulse: 71  95 88  Resp: (!) 22  (!) 29 (!) 21  Temp:    97.8 F (36.6 C)  TempSrc:    Axillary  SpO2: 95%  97% 98%  Weight:      Height:        CBC:  Recent Labs  Lab 09/20/19 0536 09/21/19 0450  WBC 16.3* 11.3*  NEUTROABS 12.4* 9.0*  HGB 8.5* 7.8*  HCT 28.2* 23.6*  MCV 97.9 91.1  PLT 361 [id number]    Basic Metabolic Panel:  Recent Labs  Lab 09/20/19 0536 09/21/19 0450  NA 152* 149*  K 4.4 4.3  CL 116* 114*  CO2 26 26  GLUCOSE 128* 144*  BUN 43* 46*  CREATININE 2.38* 2.53*  CALCIUM 9.3 8.9  MG 2.5* 2.3  PHOS 5.8* 4.5    Lipid Panel:     Component Value Date/Time   CHOL 134 09/16/2019 0613   TRIG 101 09/16/2019 0613   HDL 28 (L) 09/16/2019 0613   CHOLHDL 4.8 09/16/2019 0613   VLDL 20 09/16/2019 0613   LDLCALC 86 09/16/2019 0613   HgbA1c:  Lab Results  Component Value Date   HGBA1C 5.6 09/10/2019   Urine Drug Screen:     Component Value Date/Time   LABOPIA NONE DETECTED 09/09/2019 0349   COCAINSCRNUR NONE DETECTED 09/09/2019 0349   LABBENZ NONE DETECTED 09/09/2019 0349   AMPHETMU NONE DETECTED 09/09/2019 0349   THCU NONE DETECTED 09/09/2019 0349   LABBARB NONE DETECTED 09/09/2019 0349    Alcohol Level     Component Value Date/Time   ETH <10 09/09/2019 0407    IMAGING  Ct Head Code Stroke Wo Contrast 09/09/2019 1. 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  2. Intraventricular extension of hemorrhage. Small focus of intraparenchymal hemorrhage in the right basal ganglia.   CT head  CTA Head  09/10/2019 1. Status post left  pterional craniectomy for evacuation of left basal ganglia hematoma. Residual hematoma with interspersed postoperative gas measures 6.1 x 2.9 cm. 2. Decreased midline shift, now measuring 6 mm to the right. 3. Unchanged punctate focus of hemorrhage at the posterior right insula. 4. Unchanged intraventricular blood. 5. No intracranial arterial occlusion or high-grade stenosis. No aneurysm or vascular malformation.   CT head  09/13/2019 Stable changes post left frontoparietal craniectomy for evacuation of left-sided central hemorrhagic stroke. Residual without significant change measuring 2.8 x 6.5 cm with less air within the hemorrhage. Stable postsurgical change with mild edema/mass effect and stable midline shift to the right of 5-6 mm. Decrease in density 4 mm focus of hemorrhage over the right basal ganglia.  CT Head W/O Contrast 09/17/19 IMPRESSION: 1. Unchanged size of intraparenchymal hematoma centered in the left basal ganglia with unchanged surrounding edema and mass effect. 2. Unchanged rightward midline shift, measuring 6 mm.  Dg Chest Port 1 View 09/18/2019 1. Endotracheal tube, feeding tube in stable position. PICC line tip noted over the right atrium. 2.  Stable cardiomegaly. 3.  Low lung volumes with right base atelectasis/infiltrate. 09/17/2019 1. Lungs now clear. 2. No new abnormalities. 3. Stable support apparatus. 09/16/2019  1. No acute cardiopulmonary disease. Right lung base atelectasis has improved. 2. Support apparatus is stable and well positioned. 09/15/2019 1.  Lines and tubes in good anatomic position. 2.  Progressive right lower lobe atelectasis/infiltrate. 09/10/2019 Resolving right midlung atelectasis. Tubes and lines as described. 09/09/2019 No acute cardiopulmonary abnormality. Appropriate positioning of the endotracheal and transesophageal tubes.   Renal US 09/13/2019 1. Mildly echogenic kidneys likely related to underlying medical renal disease. 2. Mild  bilateral hydronephrosis.  No shadowing stone.  ECG - SR rate 97 BPM. LVH and prolonged QT interval. (See cardiology reading for complete details) Transthoracic Echocardiogram  09/16/2019 IMPRESSIONS 1. Left ventricular ejection fraction, by visual estimation, is 70 to 75%. The left ventricle has hyperdynamic function. Normal left ventricular size. There is severely increased left ventricular hypertrophy.  2. Global right ventricle has normal systolic function.The right ventricular size is normal. No increase in right ventricular wall thickness.  3. Left atrial size was normal.  4. Right atrial size was moderately dilated.  5. The mitral valve is grossly normal. Mild mitral valve regurgitation.  6. The tricuspid valve is grossly normal. Tricuspid valve regurgitation is trivial.  7. The aortic valve is tricuspid Aortic valve regurgitation was not visualized by color flow Doppler.  8. The pulmonic valve was grossly normal. Pulmonic valve regurgitation is not visualized by color flow Doppler.  9. The inferior vena cava was not able to be adequately assessed to estimate CVP.   PHYSICAL EXAM Temp:  [97.8 F (36.6 C)-101 F (38.3 C)] 97.8 F (36.6 C) (09/24 0700) Pulse Rate:  [71-115] 88 (09/24 0700) Resp:  [14-29] 21 (09/24 0700) BP: (111-147)/(71-104) 111/79 (09/24 0700) SpO2:  [94 %-99 %] 98 % (09/24 0700) FiO2 (%):  [40 %] 40 % (09/23 1122)  General - Well nourished, well developed, extubated on precedex.  Ophthalmologic - fundi not visualized due to noncooperation.  Cardiovascular - Regular rate and rhythm.  Neuro - extubated on precedex, tolerating well. Eyes open, nonverbal, not following commands. Eyes left gaze preference, not blinking to visual threat on the right, pupils 67mm reactive to light. Corneal reflex present bilaterally, right facial droop.  Tongue protrusion not cooperative. Spontaneous movement on the left against gravity. On pain stimulation, RUE flaccid and RLE  slight withdraw on pain. Increased muscle tone on right UE and LE. DTR 3+ on the right and positive babinski on the right. Sensation, coordination not cooperative and gait not tested.   ASSESSMENT/PLAN   [name] is a 38 y.o. male with history of admission for Etoh poisoning presenting with a seizure, vomiting.   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 In addition, small focus of R basal ganglia ICH.   NSG - decompressive craniectomy and hematoma evacuation w/ flap in R abd - 09/09/19 Dr Vertell Limber  Code Stroke CT Head - Large intraparenchymal hematoma of the left basal ganglia measuring 7.8 x 4.8 cm with 14 mm of rightward midline shift and early subfalcine herniation. Intraventricular extension of hemorrhage. Small focus of intraparenchymal hemorrhage in the right basal ganglia.   CT/CTA head - 09/10/2019 - Status post left pterional craniectomy for evacuation of left basal ganglia hematoma. Residual hematoma with interspersed postoperative gas measures 6.1 x 2.9 cm. Decreased midline shift, now measuring 6 mm to the right. No intracranial arterial occlusion or high-grade stenosis. No aneurysm or vascular malformation  Ct head 9/16 and 9/20 stable crani.  Stable 5-6 midline shift. Decreased R basal ganglia hemorrhage   HgbA1c 5.6  LDL 86  TTE 09/16/19 EF 70 to 75%. There is severely increased left ventricular hypertrophy.  [employer] Virus 2 - negative  UDS - negative  VTE prophylaxis - Heparin 5000 units sq tid   No antithrombotic prior to admission, now on No antithrombotic d/t hemorrhage   Therapy recommendations:  pending  Disposition:  Pending  Cerebral Edema  S/p crani and hematoma evacuation  Treated with 3% saline protocol, now off  Na - 158->161->157->156->157->151->153->152->149  Now Free water 200 q4h   CT repeat 09/17/19 - Unchanged size of intraparenchymal  hematoma and surrounding edema and mass effect.   Plan to d/c staples day #14 (9/26)  Respiratory Failure  Secondary to [place]  Off sedation and extubated 9/18  Re-intubated 9/18 due to low O2 sat and not protect airway  Extubated 9/23, tolerating well so far  CCM on board  VAP vs Aspiration PNA (vomited PTA) Leukocytosis, Febrile  WBC - 24.3->18.8->10.5->10.2->9.3->10.0->9.6->16.3->11.3   T Max - 101.7->afebrile ->100->101  U/A neg 9/14  Blood cultures normal flora  CXR progressive RLL atx/infilatrate -> CXR - 09/16/19 neg  D/c Vanc, d/c Zosyn -> Unasyn 9/17-9/20  CCM on board  Hypertensive Emergency  Probable undiagnosed / untreated hypertension (severe LVH on ECG)  Home BP meds: none  Current BP meds: amlodipine 10, hydralazine 50 tid, labetolol 200 tid    Off Lisinopril D/t increasing Cr  SBP goal < 160 mm Hg     Off cleviprex   BP stable   Seizure  On presentation  Hx of one seizure previously  On Keppra 500 mg Bid  AKI on CKD stage III  ACE stopped 9/14.   Renal US mild echogenic kidneys c/w CKD  Creatinine - 2.80->2.64->2.62->2.18->2.16->2.15->2.38->2.53  continue TF and free water  Alcohol abuse  Hx ETOH abuse  recent hospitalization for alcohol poisoning.   Drinks 1 pt [place] per day.   On FA/MVI/B1  On CIWA protocol.  Hyperlipidemia  Home Lipid lowering medication: none  LDL - 86  Current lipid lowering medication: None  May consider statin at discharge  Dysphagia Malnutrition . Secondary to stroke . NPO  Has cortrak and tube feeding @ 55  Speech consult re-ordered  Anemia of critical illness  Hgb 14.5- 10.7- 8.5 - 7.3 - 6.9 - PRBC - 7.8->7.1->7.6->7.6->7.8->8.5->7.8   Unclear etiology  On PPI BID  Close CBC monitoring  Other Stroke Risk Factors  Polysubstance abuse, UDS - negative 09/09/19  Other Active Problems  Hypokalemia, resolved 3.6->3.4 ->3.7->4.2->4.4 ->4.3    Mild Thrombocytopenia -  147->124->172->201->295->361 ->316  Hypocalcemia - 8.7->8.4->8.6->9.3->8.9   Hospital day # 12  This patient is critically ill and at significant risk of neurological worsening, death and care requires constant monitoring of vital signs, hemodynamics,respiratory and cardiac monitoring, extensive review of multiple databases, frequent neurological assessment, discussion with family, other specialists and medical decision making of high complexity. I spent 35 minutes of neurocritical care time  in the care of  this patient. I had long discussion with girlfriend at bedside and answered all the questions. She expressed appreciation.    Rosalin Hawking, MD PhD Stroke Neurology 09/21/2019 9:26 AM    To contact Stroke Continuity provider, please refer to [email or web address] After hours, contact General Neurology

## 2019-09-21 NOTE — Progress Notes (Signed)
Nutrition Follow-up  DOCUMENTATION CODES:   Not applicable  INTERVENTION:   Continue:  Osmolite 1.5 @ 55 ml/hr via Cortrak 30 ml Prostat BID  Provides: 2180 kcal, 112 grams protein, and 1012 ml free water. Total free water: 2212  NUTRITION DIAGNOSIS:   Inadequate oral intake related to inability to eat as evidenced by NPO status. Ongoing.   GOAL:   Patient will meet greater than or equal to 90% of their needs Met.    MONITOR:   Vent status, TF tolerance, Labs, Skin  REASON FOR ASSESSMENT:   Ventilator, Consult Enteral/tube feeding initiation and management  ASSESSMENT:   38 yo male admitted with large left intracranial hemorrhage; S/P craniotomy. PMH includes HTN, alcoholism, noncompliance.    9/14 cortrak placed, tip in stomach 9/23 pt extubated; TF resumed via cortrak   Per MD no need for a trach, pt remains aphasic.   Labs reviewed. Na 149 (H)  Medications reviewed and include folic acid, SSI, MVI, thiamine 200 ml free water every 4 hours = 1200 ml     Diet Order:   Diet Order            Diet NPO time specified  Diet effective midnight              EDUCATION NEEDS:   Not appropriate for education at this time  Skin:  Skin Assessment: Reviewed RN Assessment(incisions to head and abdomen)  Last BM:  50 ml via rectal tube x 24 hrs  Height:   Ht Readings from Last 1 Encounters:  09/09/19 '5\' 9"'  (1.753 m)    Weight:   Wt Readings from Last 1 Encounters:  09/18/19 66.1 kg    Ideal Body Weight:  72.7 kg  BMI:  Body mass index is 21.52 kg/m.  Estimated Nutritional Needs:   Kcal:  2000-2200  Protein:  95-105 gm  Fluid:  >/= 1.9 L  Maylon Peppers RD, LDN, CNSC [phone] Pager [phone] After Hours Pager

## 2019-09-22 DIAGNOSIS — I10 Essential (primary) hypertension: Secondary | ICD-10-CM

## 2019-09-22 DIAGNOSIS — R451 Restlessness and agitation: Secondary | ICD-10-CM

## 2019-09-22 DIAGNOSIS — I619 Nontraumatic intracerebral hemorrhage, unspecified: Secondary | ICD-10-CM | POA: Diagnosis present

## 2019-09-22 LAB — GLUCOSE, CAPILLARY
Glucose-Capillary: 123 mg/dL — ABNORMAL HIGH (ref 70–99)
Glucose-Capillary: 121 mg/dL — ABNORMAL HIGH (ref 70–99)
Glucose-Capillary: 103 mg/dL — ABNORMAL HIGH (ref 70–99)
Glucose-Capillary: 131 mg/dL — ABNORMAL HIGH (ref 70–99)
Glucose-Capillary: 109 mg/dL — ABNORMAL HIGH (ref 70–99)
Glucose-Capillary: 121 mg/dL — ABNORMAL HIGH (ref 70–99)

## 2019-09-22 LAB — CBC
HCT: 27.6 % — ABNORMAL LOW (ref 39.0–52.0)
Hemoglobin: 8.4 g/dL — ABNORMAL LOW (ref 13.0–17.0)
MCH: 28.8 pg (ref 26.0–34.0)
MCHC: 30.4 g/dL (ref 30.0–36.0)
MCV: 94.5 fL (ref 80.0–100.0)
Platelets: 399 10*3/uL (ref 150–400)
RBC: 2.92 MIL/uL — ABNORMAL LOW (ref 4.22–5.81)
RDW: 14.4 % (ref 11.5–15.5)
WBC: 10.9 10*3/uL — ABNORMAL HIGH (ref 4.0–10.5)
nRBC: 0 % (ref 0.0–0.2)

## 2019-09-22 LAB — PHOSPHORUS: Phosphorus: 6.4 mg/dL — ABNORMAL HIGH (ref 2.5–4.6)

## 2019-09-22 LAB — BASIC METABOLIC PANEL
Anion gap: 10 (ref 5–15)
BUN: 52 mg/dL — ABNORMAL HIGH (ref 6–20)
CO2: 26 mmol/L (ref 22–32)
Calcium: 9.2 mg/dL (ref 8.9–10.3)
Chloride: 112 mmol/L — ABNORMAL HIGH (ref 98–111)
Creatinine, Ser: 2.36 mg/dL — ABNORMAL HIGH (ref 0.61–1.24)
GFR calc Af Amer: 39 mL/min — ABNORMAL LOW (ref >60)
GFR calc non Af Amer: 33 mL/min — ABNORMAL LOW (ref >60)
Glucose, Bld: 144 mg/dL — ABNORMAL HIGH (ref 70–99)
Potassium: 4.6 mmol/L (ref 3.5–5.1)
Sodium: 148 mmol/L — ABNORMAL HIGH (ref 135–145)

## 2019-09-22 LAB — MAGNESIUM: Magnesium: 2.5 mg/dL — ABNORMAL HIGH (ref 1.7–2.4)

## 2019-09-22 MED ORDER — DEXMEDETOMIDINE HCL IN NACL 200 MCG/50ML IV SOLN
0.4000 ug/kg/h | INTRAVENOUS | Status: DC
  Administered 2019-09-22: 0.6 ug/kg/h via INTRAVENOUS
  Administered 2019-09-22 (×2): 0.4 ug/kg/h via INTRAVENOUS
  Administered 2019-09-23: 0.8 ug/kg/h via INTRAVENOUS
  Filled 2019-09-22 (×6): qty 50

## 2019-09-22 MED ORDER — QUETIAPINE FUMARATE 50 MG PO TABS
50.0000 mg | ORAL_TABLET | Freq: Two times a day (BID) | ORAL | Status: DC
  Administered 2019-09-22 – 2019-10-04 (×25): 50 mg
  Filled 2019-09-22: qty 2
  Filled 2019-09-22 (×3): qty 1
  Filled 2019-09-22: qty 2
  Filled 2019-09-22 (×2): qty 1
  Filled 2019-09-22 (×2): qty 2
  Filled 2019-09-22 (×2): qty 1
  Filled 2019-09-22: qty 2
  Filled 2019-09-22 (×2): qty 1
  Filled 2019-09-22: qty 2
  Filled 2019-09-22 (×3): qty 1
  Filled 2019-09-22 (×2): qty 2
  Filled 2019-09-22: qty 1
  Filled 2019-09-22: qty 2
  Filled 2019-09-22 (×2): qty 1
  Filled 2019-09-22: qty 2

## 2019-09-22 MED ORDER — QUETIAPINE FUMARATE 25 MG PO TABS: 25.0000 mg | ORAL_TABLET | Freq: Two times a day (BID) | ORAL | Status: DC

## 2019-09-22 NOTE — Progress Notes (Signed)
   Brief Progress Note  Called to bedside by bedside RN and Dr. Erlinda Hong. Pt is now agitated and has pulled out his Cor trac. Will restart Precedex Cancel transfer for now Re-insert Cortrac Will  increase in Seroquel to 50 BID  if EKG confirms normal QTc  Continue work on weaning from Precedex EKG in am  Bedside QTc monitoring If increase in Seroquel fails , then consider adding  clonidine per pharmacy  Magdalen Spatz, MSN, AGACNP-BC [place] Pager # [phone] After 4 pm please call [phone] 09/22/2019 11:05 AM

## 2019-09-22 NOTE — Progress Notes (Signed)
[facility] called to pick up on 9/26, but called back by PCCM as pt required precedex to be restarted.  PCCM will continue to follow pt on 9/26 and [facility] will not follow.  Please call back when [facility] needed to pick up pt.

## 2019-09-22 NOTE — Evaluation (Addendum)
Physical Therapy Evaluation Patient Details Name: Joshua [name] MRN: [id number] DOB: 1981/09/30 Today's Date: 09/22/2019   History of Present Illness  This pt is a 38 year old with history of alcohol abuse, seizures, noncompliant hypertension who presented with new seizure right-sided hemiplegia and confusion was found to have a large left intracranial hemorrhage now s/p left frontoparietal craniectomy for evacuation of left basal ganglia hematoma on 09/09/19. Skull flap in abdomen.  Intubated 9/12 (day of admission) and extubated 09/15/19.   Clinical Impression  Pt spontaneously moving his left side, reaching for feeding tube once seated.  Not really following any commands, gaze is to the left.  Increased tone in his right side.  VSS throughout mobility.  He will need extensive post acute rehab.   PT to follow acutely for deficits listed below.      Follow Up Recommendations CIR    Equipment Recommendations  Wheelchair (measurements PT);Wheelchair cushion (measurements PT);Hospital bed;3in1 (PT)    Recommendations for Other Services Rehab consult     Precautions / Restrictions Precautions Precautions: Fall Precaution Comments: L crani, bone flap removed and in abdomen, NG, L wrist restraint and mitt Restrictions Weight Bearing Restrictions: No      Mobility  Bed Mobility Overal bed mobility: Needs Assistance Bed Mobility: Supine to Sit;Sit to Supine     Supine to sit: Total assist;+2 for physical assistance;+2 for safety/equipment;HOB elevated Sit to supine: Total assist;+2 for physical assistance;+2 for safety/equipment   General bed mobility comments: assist for LEs and trunk elevation, pt requires consistent hand over hand of LUE to prevent him from pulling at his NG  Transfers                 General transfer comment: deferred         Balance Overall balance assessment: Needs assistance Sitting-balance support: Feet supported Sitting balance-Leahy Scale:  Zero Sitting balance - Comments: totalA for sitting balance, manual assist to maintain head upright, VSS EOB.                                       Pertinent Vitals/Pain Pain Assessment: Faces Faces Pain Scale: Hurts even more Pain Location: generalized discomfort, especially with NG reinsertion Pain Descriptors / Indicators: Discomfort;Grimacing;Guarding Pain Intervention(s): Limited activity within patient's tolerance;Monitored during session;Repositioned    Home Living Family/patient expects to be discharged to:: Private residence Living Arrangements: Other (Comment)(unsure, per RN "a girl" but not his girlfriend)                    Prior Function Level of Independence: Independent         Comments: suspect independent PTA, no family/caregiver present at time of eval        Extremity/Trunk Assessment   Upper Extremity Assessment Upper Extremity Assessment: Defer to OT evaluation RUE Deficits / Details: increased flexor tone noted with attempts at PROM/stretching, able to stretch R elbow into almost full extension, no withdrawal to painful stimuli RUE Coordination: decreased fine motor;decreased gross motor    Lower Extremity Assessment Lower Extremity Assessment: LLE deficits/detail;RLE deficits/detail RLE Deficits / Details: R leg with significant increase in flexion tone, however, moving L leg spontaneously in the bed, once seated seemed to have increased flexor tone vs resistance to movement in his left leg (L arm reaching for face to pull at feeding tube).   RLE Sensation: decreased light touch RLE Coordination: decreased gross motor;decreased fine  motor LLE Deficits / Details: R leg with significant increase in flexion tone, however, moving L leg spontaneously in the bed, once seated seemed to have increased flexor tone vs resistance to movement in his left leg (L arm reaching for face to pull at feeding tube).      Cervical / Trunk  Assessment Cervical / Trunk Assessment: Other exceptions Cervical / Trunk Exceptions: significant difficulty maintaining head upright without physical assist, tends to demonstrate lateral flexion and rotation to the L  Communication   Communication: Expressive difficulties;Receptive difficulties(pt does not attempt to verbalize/engage)  Cognition Arousal/Alertness: Awake/alert Behavior During Therapy: Restless;Impulsive Overall Cognitive Status: Difficult to assess                                 General Comments: pt awake but impulsive and consistently attempting to use LUE to pull out NG (already pulled out this AM and just reinserted start of session); pt squeezing therapist's hand, but not consistently given command to do so, difficult to tell whether he is intermittently follow commands or squeezing spontaneously      General Comments General comments (skin integrity, edema, etc.): VSS throughout        Assessment/Plan    PT Assessment Patient needs continued PT services  PT Problem List Decreased strength;Decreased range of motion;Decreased activity tolerance;Decreased balance;Decreased mobility;Decreased coordination;Decreased cognition;Decreased knowledge of use of DME;Decreased knowledge of precautions;Decreased safety awareness;Cardiopulmonary status limiting activity;Impaired sensation;Impaired tone       PT Treatment Interventions DME instruction;Gait training;Stair training;Functional mobility training;Therapeutic activities;Therapeutic exercise;Balance training;Neuromuscular re-education;Cognitive remediation;Patient/family education;Wheelchair mobility training;Manual techniques    PT Goals (Current goals can be found in the Care Plan section)  Acute Rehab PT Goals Patient Stated Goal: none stated today PT Goal Formulation: Patient unable to participate in goal setting Time For Goal Achievement: 10/06/19 Potential to Achieve Goals: Fair    Frequency  Min 4X/week        Co-evaluation PT/OT/SLP Co-Evaluation/Treatment: Yes Reason for Co-Treatment: Complexity of the patient's impairments (multi-system involvement);Necessary to address cognition/behavior during functional activity;For patient/therapist safety;To address functional/ADL transfers PT goals addressed during session: Mobility/safety with mobility;Strengthening/ROM OT goals addressed during session: Strengthening/ROM       AM-PAC PT "6 Clicks" Mobility  Outcome Measure Help needed turning from your back to your side while in a flat bed without using bedrails?: Total Help needed moving from lying on your back to sitting on the side of a flat bed without using bedrails?: Total Help needed moving to and from a bed to a chair (including a wheelchair)?: Total Help needed standing up from a chair using your arms (e.g., wheelchair or bedside chair)?: Total Help needed to walk in hospital room?: Total Help needed climbing 3-5 steps with a railing? : Total 6 Click Score: 6    End of Session Equipment Utilized During Treatment: Oxygen(3L- took off EOB, re-applied in supine) Activity Tolerance: Patient tolerated treatment well Patient left: in bed;with call bell/phone within reach;with bed alarm set Nurse Communication: Mobility status PT Visit Diagnosis: Muscle weakness (generalized) (M62.81);Difficulty in walking, not elsewhere classified (R26.2);Hemiplegia and hemiparesis Hemiplegia - Right/Left: Right Hemiplegia - dominant/non-dominant: Dominant Hemiplegia - caused by: Nontraumatic intracerebral hemorrhage    Time: [id number] PT Time Calculation (min) (ACUTE ONLY): 56 min   Charges:       Wells Guiles B. Elah Avellino, PT, DPT  Acute Rehabilitation [phone] pager [phone] office  @ [facility]: [phone]   PT  Evaluation $PT Eval Moderate Complexity: 1 Mod PT Treatments $Therapeutic Activity: 23-37 mins       09/22/2019, 3:02 PM

## 2019-09-22 NOTE — Procedures (Signed)
Cortrak  Person Inserting Tube:  Birtha Hatler, RD Tube Type:  Cortrak - 43 inches Tube Location:  Right nare Initial Placement:  Stomach Secured by: Bridle Technique Used to Measure Tube Placement:  Documented cm marking at nare/ corner of mouth Cortrak Secured At:  66 cm   No x-ray is required. RN may begin using tube.   If the tube becomes dislodged please keep the tube and contact the Cortrak team at [email or web address] ([email or web address]) for replacement.  If after hours and replacement cannot be delayed, place a NG tube and confirm placement with an abdominal x-ray.    Mariana Single RD, LDN Clinical Nutrition Pager # [phone]

## 2019-09-22 NOTE — Evaluation (Signed)
Occupational Therapy Evaluation Patient Details Name: Joshua [name] MRN: [id number] DOB: 16-Apr-1981 Today's Date: 09/22/2019    History of Present Illness 38 year old history of alcohol abuse seizures noncompliant hypertension who presented with new seizure right-sided hemiplegia and confusion was found to have a large left intracranial hemorrhage now s/p left frontoparietal craniectomy for evacuation of left basal ganglia hematoma.   Clinical Impression   This 38 y/o male presents with the above. No family caregiver present at time of eval, though suspect pt likely independent PTA. Pt presenting with impaired cognition, R side weakness (notable flexor tone with ROM attempts), impaired sitting balance and functional performance. Pt currently requiring totalA+2 for bed mobility, tolerating sitting EOB >5 min with totalA for sitting balance. Pt with strong L side but unable to follow simple commands this session to use functionally. He will benefit from continued acute OT therapy services and recommend post acute therapy services to maximize his safety and independence with ADL and mobility. Will continue to follow.     Follow Up Recommendations  CIR;Supervision/Assistance - 24 hour    Equipment Recommendations  Other (comment)(TBD)    Recommendations for Other Services Rehab consult     Precautions / Restrictions Precautions Precautions: Fall Precaution Comments: L crani, bone flap removed, NG, L wrist restraint and mitt Restrictions Weight Bearing Restrictions: No      Mobility Bed Mobility Overal bed mobility: Needs Assistance Bed Mobility: Supine to Sit;Sit to Supine     Supine to sit: Total assist;+2 for physical assistance;+2 for safety/equipment;HOB elevated Sit to supine: Total assist;+2 for physical assistance;+2 for safety/equipment   General bed mobility comments: assist for LEs and trunk elevation, pt requires consistent hand over hand of LUE to prevent him from  pulling at his NG  Transfers                 General transfer comment: deferred    Balance Overall balance assessment: Needs assistance Sitting-balance support: Feet supported Sitting balance-Leahy Scale: Zero Sitting balance - Comments: totalA for sitting balance, manual assist to maintain head upright                                   ADL either performed or assessed with clinical judgement   ADL Overall ADL's : Needs assistance/impaired Eating/Feeding: NPO                                   Functional mobility during ADLs: Total assistance;+2 for physical assistance;+2 for safety/equipment General ADL Comments: totalA for ADL at this time     Vision   Vision Assessment?: Vision impaired- to be further tested in functional context;Yes Additional Comments: pt does not make eye contact to therapist or items, when manually turning head towards/past midline towards R visual field pt unable to track/follow with his eyes, eyes remain towards L visual field      Perception     Praxis      Pertinent Vitals/Pain Pain Assessment: Faces Faces Pain Scale: Hurts even more Pain Location: generalized discomfort, especially with NG reinsertion Pain Descriptors / Indicators: Discomfort;Grimacing;Guarding Pain Intervention(s): Monitored during session;Repositioned;Limited activity within patient's tolerance     Hand Dominance     Extremity/Trunk Assessment Upper Extremity Assessment Upper Extremity Assessment: RUE deficits/detail RUE Deficits / Details: increased flexor tone noted with attempts at PROM/stretching, able to stretch R elbow into  almost full extension, no withdrawal to painful stimuli RUE Coordination: decreased fine motor;decreased gross motor   Lower Extremity Assessment Lower Extremity Assessment: Defer to PT evaluation   Cervical / Trunk Assessment Cervical / Trunk Assessment: Other exceptions Cervical / Trunk Exceptions:  significant difficulty maintaining head upright without physical assist, tends to demonstrate lateral flexion to the L   Communication Communication Communication: Expressive difficulties;Receptive difficulties(pt does not attempt to verbalize/engage)   Cognition Arousal/Alertness: Awake/alert Behavior During Therapy: Restless;Impulsive Overall Cognitive Status: Difficult to assess                                 General Comments: pt awake but impulsive and consistently attempting to use LUE to pull out NG (already pulled out this AM and just reinserted start of session); pt squeezing therapist's hand, but not consistently given command to do so, difficult to tell whether he is intermittently follow commands or squeezing spontaneously   General Comments  VSS throughout    Exercises     Shoulder Instructions      Home Living Family/patient expects to be discharged to:: Private residence                                        Prior Functioning/Environment Level of Independence: Independent        Comments: suspect independent PTA, no family/caregiver present at time of eval        OT Problem List: Decreased strength;Decreased range of motion;Decreased activity tolerance;Impaired balance (sitting and/or standing);Impaired vision/perception;Decreased coordination;Decreased cognition;Decreased safety awareness;Decreased knowledge of use of DME or AE;Decreased knowledge of precautions;Impaired tone;Impaired sensation      OT Treatment/Interventions: Self-care/ADL training;Therapeutic exercise;Neuromuscular education;Energy conservation;DME and/or AE instruction;Therapeutic activities;Cognitive remediation/compensation;Visual/perceptual remediation/compensation;Patient/family education;Balance training;Manual therapy    OT Goals(Current goals can be found in the care plan section) Acute Rehab OT Goals Patient Stated Goal: none stated today OT Goal  Formulation: Patient unable to participate in goal setting Time For Goal Achievement: 10/06/19 Potential to Achieve Goals: Good  OT Frequency: Min 2X/week   Barriers to D/C:            Co-evaluation PT/OT/SLP Co-Evaluation/Treatment: Yes     OT goals addressed during session: Strengthening/ROM      AM-PAC OT "6 Clicks" Daily Activity     Outcome Measure Help from another person eating meals?: Total Help from another person taking care of personal grooming?: Total Help from another person toileting, which includes using toliet, bedpan, or urinal?: Total Help from another person bathing (including washing, rinsing, drying)?: Total Help from another person to put on and taking off regular upper body clothing?: Total Help from another person to put on and taking off regular lower body clothing?: Total 6 Click Score: 6   End of Session Nurse Communication: Mobility status  Activity Tolerance: Patient tolerated treatment well Patient left: in bed;with call bell/phone within reach;with bed alarm set;with restraints reapplied  OT Visit Diagnosis: Other abnormalities of gait and mobility (R26.89);Hemiplegia and hemiparesis;Cognitive communication deficit (R41.841) Symptoms and signs involving cognitive functions: Other Nontraumatic ICH Hemiplegia - Right/Left: Right Hemiplegia - caused by: Other Nontraumatic intracranial hemorrhage                Time: [id number] OT Time Calculation (min): 35 min Charges:  OT General Charges $OT Visit: 1 Visit OT Evaluation $OT Eval Moderate Complexity:  [place], [place] Pager [phone] Office [phone]   Raymondo Band 09/22/2019, 1:28 PM

## 2019-09-22 NOTE — Evaluation (Signed)
Speech Language Pathology Evaluation Patient Details Name: Joshua [name] MRN: [id number] DOB: Jun 14, 1981 Today's Date: 09/22/2019 Time: [id number] SLP Time Calculation (min) (ACUTE ONLY): 8 min  Problem List:  Patient Active Problem List   Diagnosis Date Noted  . Cytotoxic brain edema ([place]) 09/10/2019  . ICH (intracerebral hemorrhage) ([place]) 09/09/2019  . Subdural hematoma ([place]) 09/09/2019  . Intracerebral hemorrhage ([place]) 09/09/2019   Past Medical History: No past medical history on file. Past Surgical History:  Past Surgical History:  Procedure Laterality Date  . CRANIOTOMY Left 09/09/2019   Procedure: DECOMPRESSIVE CRANIECTOMY AND  HEMATOMA EVACUATION, IMPLANTATION OF SKULL FLAP IN RIGHT ABDOMEN;  Surgeon: Erline Levine, MD;  Location: [facility];  Service: Neurosurgery;  Laterality: Left;   HPI:  Pt is a 38 year old male who presented with new seizure, and was found to have a large left BG intracranial hemorrhage s/p L frontal temporal craniectomy with evacuation 9/12. ETT 9/12-9/18; 9/18-9/23. PMH: HTN, alcoholism, noncompliance   Assessment / Plan / Recommendation Clinical Impression   Pt presents as globally aphasic, with no verbal output noted during this evaluation. He has severe communication impairment across all receptive and expressive areas of language, not following commands or responding verbally or nonverbally to yes/no questions. Further, he has impaired focused attention and does not attend to his R side, even with noxious stimuli. His gaze is deviated to the L but he does not visually focus on SLP even when standing on that side. He will need SLP f/u to facilitate cognitive and communicative function.    SLP Assessment  SLP Recommendation/Assessment: Patient needs continued Speech Lanaguage Pathology Services SLP Visit Diagnosis: Aphasia (R47.01)    Follow Up Recommendations  (tba)    Frequency and Duration min 2x/week  2 weeks      SLP Evaluation Cognition  Overall Cognitive Status: Difficult to assess(aphasia) Arousal/Alertness: Awake/alert Orientation Level: Disoriented X4 Attention: Focused Focused Attention: Impaired Focused Attention Impairment: Verbal basic;Functional basic Problem Solving: Impaired Problem Solving Impairment: Functional basic Safety/Judgment: Impaired Comments: R neglect       Comprehension  Auditory Comprehension Overall Auditory Comprehension: Impaired Yes/No Questions: Impaired Basic Biographical Questions: 0-25% accurate Commands: Impaired One Step Basic Commands: 0-24% accurate    Expression Expression Primary Mode of Expression: Verbal Verbal Expression Overall Verbal Expression: Impaired Initiation: Impaired Automatic Speech: (none) Pragmatics: Impairment Impairments: Eye contact Non-Verbal Means of Communication: Not applicable   Oral / Motor  Oral Motor/Sensory Function Overall Oral Motor/Sensory Function: Other (comment)(not following commands to assess) Motor Speech Overall Motor Speech: Other (comment)(UTA)   GO                    Talbert Nan 09/22/2019, 10:47 AM  Pollyann Glen, M.A. [facility] Acute Environmental education officer [phone] Office [phone]

## 2019-09-22 NOTE — Progress Notes (Addendum)
PULMONARY / CRITICAL CARE MEDICINE   NAME:  Joshua [name], MRN:  [id number], DOB:  07-01-1981, LOS: 72 ADMISSION DATE:  09/09/2019, CONSULTATION DATE: 09/09/2019 REFERRING MD: Neurosurgery, CHIEF COMPLAINT: Vent dependent respiratory failure secondary to intracranial hemorrhage BRIEF HISTORY:    38 year old history of alcohol abuse seizures noncompliant hypertension who presented with new seizure right-sided hemiplegia confusion was found to have a large left intracranial hemorrhage for which he underwent craniectomy.  SIGNIFICANT PAST MEDICAL HISTORY   Hypertension Alcohol abuse  SIGNIFICANT EVENTS:  9/12 Admitted/  operating room for left frontal temporal craniectomy and evacuation hematoma 9/14 off hypertonic saline, fevers/ started on abx  9/15 ? Withdrawals?, started on precedex, cleviprex off overnight, weaned 8 hrs on SBT 5/5  9/16 decreasing Hgb, continues to wean well- mental status remains barrier 9/18 extubated > reintubated due to protect airway and low oxygen 9/23 > Extubated   STUDIES:   CT of head 09/09/2019 >1. 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 2. Intraventricular extension of hemorrhage. Small focus of intraparenchymal hemorrhage in the right basal ganglia. CT of head 9/13 > decreased size of left basal ganglia hematoma with expected postoperative air.  Mild improvement of associated mass-effect.  Redistribution of intraventricular blood. [place] 9/16 > 1.  Stable changes post left frontoparietal craniectomy for evacuation of left-sided central hemorrhagic stroke. Residual without significant change measuring 2.8 x 6.5 cm with less air within the hemorrhage. Stable postsurgical change with mild edema/mass effect and stable midline shift to the right of 5-6 mm. 2.  Decrease in density 4 mm focus of hemorrhage over the right basal ganglia. Renal US 9/16 > 1. Mildly  echogenic kidneys likely related to underlying medical renal disease. 2. Mild bilateral hydronephrosis.  No shadowing stone.  CULTURES:  09/09/2019 sputum>normal flora 9/12 MRSA PCR > neg 9/12 COVID > neg 9/14 sputum> normal flora  ANTIBIOTICS:  Vanc 9/14> 9/16 Pip-tazo 9/14>9/17 Unasyn 9/17 >9/21  LINES/TUBES:  ETT > 9/12- 9/18 re-intubated 9/18-9/23 Right radial A-line 9/12 > 9/15 L cortrak 9/14 >> Right Brachial PICC 9/14 >>   CONSULTANTS:  09/09/2019 pulmonary critical care 09/09/2019 neurology 09/09/2019 NSGY  SUBJECTIVE:  Extubated 9/23. Tmax 99.1 overnight. 9/25>> protecting airway.  Does not follow commands  Precedex is off 9/25 Started on Seroquel 9/24  CONSTITUTIONAL: BP 129/89   Pulse 82   Temp 99.1 F (37.3 C) (Axillary)   Resp 18   Ht 5\' 9"  (1.753 m)   Wt 60.4 kg   SpO2 97%   BMI 19.66 kg/m   I/O last 3 completed shifts: In: 3939.4 [I.V.:614.4; NG/GT:3325] Out: 67 [Urine:3350; Stool:50]    PHYSICAL EXAM: General Appearance: young adult male, no distress  HEENT: small bore NG in place, surgical site noted to right lateral skull , staples intact Lungs: Bilateral chest excursion, clear breath sounds, no wheeze/crackles , diminished per bases Cardiovascular: S1, S2, RRR, no MRG  Abdomen: soft and non-tender, ND, active bowel sounds Musculoskeletal: -edema , no obvious deformities noted Neuro: alert, pupils intact, does not follow commands, withdrawals on left, slight movement noted to right upper and lower , Right sided contractures upper extremity   RESOLVED PROBLEM LIST  Thrombocytopenia  ASSESSMENT AND PLAN    Respiratory Insufficieny in setting of ICH Acute Hypoxic Respiratory Distress in setting of Aspiration PNA >> Resolving, s/p 8 days of Antibiotics  Plan -Extubated 9/23, continues to protect airway with no distress if  fails will need tracheostomy  -Continue Aggressive Pulmonary Hygiene - Trend CXR prn  Lt BG ICH with midline shift  and subfalcine herniation complicated by cerebral edema and seizures s/p Craniectomy with evacuation  -Treated with 3%, now off  Plan  -Neurology Following  -Remove staples on day 14 -Continue Keppra BID  -PT/OT/ST when able   Hypertensive Emergency  Plan  -Cardiac Monitoring  -goal SBP < 160 -continue norvasc, hydralazine, labetalol  Acute on Chronic Kidney Disease Stage 3 - FeNa suggestive prerenal, renal US c/w CKD, mild hydroneprhosis without stone Mild hyperchloremic acidosis >> Improving  Hypernatremia- initially iatrogenic with hypertonic saline, stopped 9/14 >> Improving  Plan -Trend BMP  -Continue Free Water 200 q4h - Replete electrolytes prn   Polysubstance/ ETOH abuse - UDS negative - Reportedly drinks Hennessey 6 shots to 1pt a day Plan  - continue thiamine, folic acid, MVI - goal RASS 0 to -1 with precedex  Agitation Started on Seroquel 9/24 Precedex weaned to off  9/25 Plan Transfer to Progressive Floor now that he is off precedex Continue to monitor  Dysphagia  Plan -continue TF   Hyperglycemia Plan - CBG Q 4 - SSI  Anemia of critical illness , s/p 1U PRBC 9/17 Plan - f/u CBC - Monitor for active bleeding - transfuse for Hb < 7 or significant bleeding  Pt is off precedex , will transfer to neuro progressive and have [facility] assume medical care. Neuro will continue to manage neuro care  Best Practice / Goals of Care / Disposition.    DVT PROPHYLAXIS: SCDs, Donovan heparin  GI prophylaxis: PPI BID per tube Pain agitation: RASS goal 0/-1, precedex NUTRITION: NPO/ tube feedings at goal  MOBILITY: Bedrest GOALS OF CARE: Full code Foley catheter: condom catheter FAMILY DISCUSSIONS: Sister and fianc updated DISPOSITION Will transfer to progressive Neuro Floor  Magdalen Spatz, MSN,  AGACNP-BC [facility]  Pgr: [phone]  After 4 pm please call [phone] 09/22/2019 9:28 AM

## 2019-09-22 NOTE — Progress Notes (Signed)
STROKE TEAM PROGRESS NOTE   INTERVAL HISTORY Pt EKG tech at bedside for EKG. No PT prolongation, on seroquel now. Pt was taper off precedex earlier but not continued to have agitation, on seroquel. Lethargic but eyes open, not following commands, still has right hemiplegia.    OBJECTIVE Vitals:   09/22/19 0500 09/22/19 0557 09/22/19 0800 09/22/19 0900  BP:  (!) 151/106 115/79 129/89  Pulse:   78 82  Resp:   18 18  Temp:      TempSrc:      SpO2:   99% 97%  Weight: 60.4 kg     Height:        CBC:  Recent Labs  Lab 09/20/19 0536 09/21/19 0450 09/22/19 0543  WBC 16.3* 11.3* 10.9*  NEUTROABS 12.4* 9.0*  --   HGB 8.5* 7.8* 8.4*  HCT 28.2* 23.6* 27.6*  MCV 97.9 91.1 94.5  PLT 361 316 [id number]    Basic Metabolic Panel:  Recent Labs  Lab 09/21/19 0450 09/22/19 0543  NA 149* 148*  K 4.3 4.6  CL 114* 112*  CO2 26 26  GLUCOSE 144* 144*  BUN 46* 52*  CREATININE 2.53* 2.36*  CALCIUM 8.9 9.2  MG 2.3 2.5*  PHOS 4.5 6.4*    Lipid Panel:     Component Value Date/Time   CHOL 134 09/16/2019 0613   TRIG 101 09/16/2019 0613   HDL 28 (L) 09/16/2019 0613   CHOLHDL 4.8 09/16/2019 0613   VLDL 20 09/16/2019 0613   LDLCALC 86 09/16/2019 0613   HgbA1c:  Lab Results  Component Value Date   HGBA1C 5.6 09/10/2019   Urine Drug Screen:     Component Value Date/Time   LABOPIA NONE DETECTED 09/09/2019 0349   COCAINSCRNUR NONE DETECTED 09/09/2019 0349   LABBENZ NONE DETECTED 09/09/2019 0349   AMPHETMU NONE DETECTED 09/09/2019 0349   THCU NONE DETECTED 09/09/2019 0349   LABBARB NONE DETECTED 09/09/2019 0349    Alcohol Level     Component Value Date/Time   ETH <10 09/09/2019 0407    IMAGING  Ct Head Code Stroke Wo Contrast 09/09/2019 1. 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  2. Intraventricular extension of hemorrhage. Small focus of intraparenchymal hemorrhage in  the right basal ganglia.   CT head  CTA Head  09/10/2019 1. Status post left pterional craniectomy for evacuation of left basal ganglia hematoma. Residual hematoma with interspersed postoperative gas measures 6.1 x 2.9 cm. 2. Decreased midline shift, now measuring 6 mm to the right. 3. Unchanged punctate focus of hemorrhage at the posterior right insula. 4. Unchanged intraventricular blood. 5. No intracranial arterial occlusion or high-grade stenosis. No aneurysm or vascular malformation.   CT head  09/13/2019 Stable changes post left frontoparietal craniectomy for evacuation of left-sided central hemorrhagic stroke. Residual without significant change measuring 2.8 x 6.5 cm with less air within the hemorrhage. Stable postsurgical change with mild edema/mass effect and stable midline shift to the right of 5-6 mm. Decrease in density 4 mm focus of hemorrhage over the right basal ganglia.  CT Head W/O Contrast 09/17/19 IMPRESSION: 1. Unchanged size of intraparenchymal hematoma centered in the left basal ganglia with unchanged surrounding edema and mass effect. 2. Unchanged rightward midline shift, measuring 6 mm.  Dg Chest Port 1 View 09/18/2019 1. Endotracheal tube, feeding tube in stable position. PICC line tip noted over the right atrium. 2.  Stable cardiomegaly.  3.  Low lung volumes with right base atelectasis/infiltrate. 09/17/2019 1. Lungs now clear. 2. No new abnormalities. 3. Stable support apparatus. 09/16/2019 1. No acute cardiopulmonary disease. Right lung base atelectasis has improved. 2. Support apparatus is stable and well positioned. 09/15/2019 1.  Lines and tubes in good anatomic position. 2.  Progressive right lower lobe atelectasis/infiltrate. 09/10/2019 Resolving right midlung atelectasis. Tubes and lines as described. 09/09/2019 No acute cardiopulmonary abnormality. Appropriate positioning of the endotracheal and transesophageal tubes.   Renal US 09/13/2019 1. Mildly  echogenic kidneys likely related to underlying medical renal disease. 2. Mild bilateral hydronephrosis.  No shadowing stone.  ECG - SR rate 97 BPM. LVH and prolonged QT interval. (See cardiology reading for complete details) Transthoracic Echocardiogram  09/16/2019 IMPRESSIONS 1. Left ventricular ejection fraction, by visual estimation, is 70 to 75%. The left ventricle has hyperdynamic function. Normal left ventricular size. There is severely increased left ventricular hypertrophy.  2. Global right ventricle has normal systolic function.The right ventricular size is normal. No increase in right ventricular wall thickness.  3. Left atrial size was normal.  4. Right atrial size was moderately dilated.  5. The mitral valve is grossly normal. Mild mitral valve regurgitation.  6. The tricuspid valve is grossly normal. Tricuspid valve regurgitation is trivial.  7. The aortic valve is tricuspid Aortic valve regurgitation was not visualized by color flow Doppler.  8. The pulmonic valve was grossly normal. Pulmonic valve regurgitation is not visualized by color flow Doppler.  9. The inferior vena cava was not able to be adequately assessed to estimate CVP.   PHYSICAL EXAM   Temp:  [98.3 F (36.8 C)-99.1 F (37.3 C)] 99.1 F (37.3 C) (09/25 0400) Pulse Rate:  [78-108] 82 (09/25 0900) Resp:  [17-31] 18 (09/25 0900) BP: (115-151)/(79-108) 129/89 (09/25 0900) SpO2:  [96 %-99 %] 97 % (09/25 0900) Weight:  [60.4 kg] 60.4 kg (09/25 0500)  General - Well nourished, well developed, off precedex, lethargic and agitated.  Ophthalmologic - fundi not visualized due to noncooperation.  Cardiovascular - Regular rate and rhythm.  Neuro - agitated with mild shivering, lethargic, eyes open, nonverbal, not following commands. Eyes left gaze preference, not blinking to visual threat on the right, not tracking, pupils 64mm reactive to light. Right facial droop.  Tongue protrusion not cooperative. Spontaneous  movement on the left against gravity. On pain stimulation, RUE flaccid and RLE slight withdraw on pain. Increased muscle tone on right UE and LE. DTR 3+ on the right and positive babinski on the right. Sensation, coordination not cooperative and gait not tested.    ASSESSMENT/PLAN   [name] is a 38 y.o. male with history of admission for Etoh poisoning presenting with a seizure, vomiting.   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 In addition, small focus of R basal ganglia ICH.   NSG - decompressive craniectomy and hematoma evacuation w/ flap in R abd - 09/09/19 Dr Vertell Limber  Code Stroke CT Head - Large intraparenchymal hematoma of the left basal ganglia measuring 7.8 x 4.8 cm with 14 mm of rightward midline shift and early subfalcine herniation. Intraventricular extension of hemorrhage. Small focus of intraparenchymal hemorrhage in the right basal ganglia.   CT/CTA head - 09/10/2019 - Status post left pterional craniectomy for evacuation of left basal ganglia hematoma. Residual hematoma with interspersed postoperative gas measures 6.1 x 2.9 cm. Decreased midline shift, now measuring  6 mm to the right. No intracranial arterial occlusion or high-grade stenosis. No aneurysm or vascular malformation  Ct head 9/16 and 9/20 stable crani. Stable 5-6 midline shift. Decreased R basal ganglia hemorrhage   HgbA1c 5.6  LDL 86  TTE 09/16/19 EF 70 to 75%. There is severely increased left ventricular hypertrophy.  [employer] Virus 2 - negative  UDS - negatived  VTE prophylaxis - Heparin 5000 units sq tid   No antithrombotic prior to admission, now on No antithrombotic d/t hemorrhage   Therapy recommendations: pending    Disposition:  Pending  Cerebral Edema  S/p crani and hematoma evacuation  Treated with 3% saline protocol, now off  Na -  158->161->157->156->157->151->153->152->149->148  Now Free water 200 q4h   CT repeat 09/17/19 - Unchanged size of intraparenchymal hematoma and surrounding edema and mass effect.   PICC line out 9/25, for midline  Now 10 days out of onset, will allow gradually normalization of Na   Plan to d/c staples day #14 (9/26)  Respiratory Failure  Secondary to [place]  Off sedation and extubated 9/18  Re-intubated 9/18 due to low O2 sat and not protect airway  Extubated 9/23, tolerating well  Agitated when off precedex, CCM changing to enteral seroquel  CCM on board  VAP vs Aspiration PNA (vomited PTA) Leukocytosis, Febrile  WBC - 24.3->18.8->10.5->10.2->9.3->10.0->9.6->16.3->11.3->10.9   T Max - 101.7->afebrile ->100->101->99.1  U/A neg 9/14  Blood cultures normal flora  CXR progressive RLL atx/infilatrate -> CXR - 09/16/19 neg  D/c Vanc, d/c Zosyn -> Unasyn 9/17-9/20  CCM on board  Hypertensive Emergency  Probable undiagnosed / untreated hypertension (severe LVH on ECG)  Home BP meds: none  Current BP meds: amlodipine 10, hydralazine 50 tid, labetolol 200 tid    Off Lisinopril D/t increasing Cr  SBP goal < 160 mm Hg     Off cleviprex   BP stable   Seizure  On presentation  Hx of one seizure previously  On Keppra 500 mg Bid  AKI on CKD stage III  ACE stopped 9/14.   Renal US mild echogenic kidneys c/w CKD  Creatinine - 2.80->2.64->2.62->2.18->2.16->2.15->2.38->2.53->2.36  continue TF and free water  Alcohol abuse  Hx ETOH abuse  recent hospitalization for alcohol poisoning.   Drinks 1 pt [place] per day.   On FA/MVI/B1  On CIWA protocol.  Hyperlipidemia  Home Lipid lowering medication: none  LDL - 86  Current lipid lowering medication: None  May consider statin at discharge  Dysphagia Malnutrition . Secondary to stroke . NPO  Has cortrak and tube feeding @ 55  Speech consult re-ordered  Anemia of critical illness  Hgb  14.5- 10.7- 8.5 - 7.3 - 6.9 - PRBC - 7.8->7.1->7.6->7.6->7.8->8.5->7.8->8.4   Unclear etiology  On PPI BID  Close CBC monitoring  Other Stroke Risk Factors  Polysubstance abuse, UDS - negative 09/09/19  Other Active Problems  Hypokalemia, resolved 3.6->3.4 ->3.7->4.2->4.4 ->4.3->4.6    Mild Thrombocytopenia, resolved - 147->124->172->201->295->361 ->316->399  Hypocalcemia, resolved - 8.7->8.4->8.6->9.3->8.9->9.2  Hospital day # 13  This patient is critically ill and at significant risk of neurological worsening, death and care requires constant monitoring of vital signs, hemodynamics,respiratory and cardiac monitoring, extensive review of multiple databases, frequent neurological assessment, discussion with family, other specialists and medical decision making of high complexity. I spent 35 minutes of neurocritical care time  in the care of  this patient. I also discussed with CCM NP Sarah.    Rosalin Hawking, MD PhD Stroke Neurology 09/22/2019 10:03 AM  To contact Stroke Continuity provider, please refer to [email or web address] After hours, contact General Neurology

## 2019-09-22 NOTE — Progress Notes (Signed)
Rehab Admissions Coordinator Note:  Per therapy recommendations, patient was screened by Michel Santee for appropriateness for an Inpatient Acute Rehab Consult.  At this time, we will follow along for progress to see if pt can tolerate intensive rehab prior to requesting a consult order. Michel Santee 09/22/2019, 2:06 PM  I can be reached at [phone].

## 2019-09-22 NOTE — Evaluation (Signed)
Clinical/Bedside Swallow Evaluation Patient Details  Name: Joshua [name] MRN: [id number] Date of Birth: 1981/11/19  Today's Date: 09/22/2019 Time: SLP Start Time (ACUTE ONLY): 1017 SLP Stop Time (ACUTE ONLY): 1024 SLP Time Calculation (min) (ACUTE ONLY): 7 min  Past Medical History: No past medical history on file. Past Surgical History:  Past Surgical History:  Procedure Laterality Date  . CRANIOTOMY Left 09/09/2019   Procedure: DECOMPRESSIVE CRANIECTOMY AND  HEMATOMA EVACUATION, IMPLANTATION OF SKULL FLAP IN RIGHT ABDOMEN;  Surgeon: Erline Levine, MD;  Location: [facility];  Service: Neurosurgery;  Laterality: Left;   HPI:  Pt is a 38 year old male who presented with new seizure, and was found to have a large left BG intracranial hemorrhage s/p L frontal temporal craniectomy with evacuation 9/12. ETT 9/12-9/18; 9/18-9/23. PMH: HTN, alcoholism, noncompliance   Assessment / Plan / Recommendation Clinical Impression  Pt made no attempts to orally accept boluses, showing no indication that he was aware of their presence. He is not following commands to open his mouth with teeth clenched when lips were passively opened. Would consider replacing temporary, alternative means of nutrition. His cortrak had been pulled upon SLP arrival, so there is certainly risk of him pulling repeated attempts, but he is not ready for a PO diet at this time. Will continue to follow to reassess.  SLP Visit Diagnosis: Dysphagia, unspecified (R13.10)    Aspiration Risk  Severe aspiration risk;Risk for inadequate nutrition/hydration    Diet Recommendation NPO;Alternative means - temporary   Medication Administration: Via alternative means    Other  Recommendations Oral Care Recommendations: Oral care QID Other Recommendations: Have oral suction available   Follow up Recommendations (tba)      Frequency and Duration min 2x/week  2 weeks       Prognosis Prognosis for Safe Diet Advancement: Good Barriers to  Reach Goals: Language deficits;Cognitive deficits      Swallow Study   General HPI: Pt is a 38 year old male who presented with new seizure, and was found to have a large left BG intracranial hemorrhage s/p L frontal temporal craniectomy with evacuation 9/12. ETT 9/12-9/18; 9/18-9/23. PMH: HTN, alcoholism, noncompliance Type of Study: Bedside Swallow Evaluation Previous Swallow Assessment: none in chart Diet Prior to this Study: NPO;Other (Comment)(pt had cortrak but it was pulled out upon SLP arrival) Temperature Spikes Noted: No Respiratory Status: Room air History of Recent Intubation: No Behavior/Cognition: Alert;Doesn't follow directions Oral Cavity Assessment: Other (comment)(UTA) Oral Care Completed by SLP: No Oral Cavity - Dentition: Other (Comment)(UTA) Self-Feeding Abilities: Total assist Patient Positioning: Other (comment)(pt leaned to his L) Baseline Vocal Quality: Not observed    Oral/Motor/Sensory Function Overall Oral Motor/Sensory Function: (not following commands for assessment)   Ice Chips Ice chips: Impaired Presentation: Spoon Oral Phase Impairments: Poor awareness of bolus;Other (comment)(does not open his mouth to accept anything)   Thin Liquid Thin Liquid: Impaired Presentation: Spoon Oral Phase Impairments: Poor awareness of bolus;Other (comment)(pt does not open his mouth to accept anything)    Nectar Thick Nectar Thick Liquid: Not tested   Honey Thick Honey Thick Liquid: Not tested   Puree Puree: Not tested   Solid     Solid: Not tested      Joshua [name] 09/22/2019,10:40 AM  Pollyann Glen, M.A. [facility] Acute Environmental education officer [phone] Office [phone]

## 2019-09-23 ENCOUNTER — Inpatient Hospital Stay (HOSPITAL_COMMUNITY): Payer: Medicaid Other

## 2019-09-23 DIAGNOSIS — S065X9A Traumatic subdural hemorrhage with loss of consciousness of unspecified duration, initial encounter: Secondary | ICD-10-CM

## 2019-09-23 LAB — BASIC METABOLIC PANEL
Anion gap: 10 (ref 5–15)
BUN: 51 mg/dL — ABNORMAL HIGH (ref 6–20)
CO2: 23 mmol/L (ref 22–32)
Calcium: 9.2 mg/dL (ref 8.9–10.3)
Chloride: 111 mmol/L (ref 98–111)
Creatinine, Ser: 2.27 mg/dL — ABNORMAL HIGH (ref 0.61–1.24)
GFR calc Af Amer: 41 mL/min — ABNORMAL LOW (ref >60)
GFR calc non Af Amer: 35 mL/min — ABNORMAL LOW (ref >60)
Glucose, Bld: 121 mg/dL — ABNORMAL HIGH (ref 70–99)
Potassium: 4.1 mmol/L (ref 3.5–5.1)
Sodium: 144 mmol/L (ref 135–145)

## 2019-09-23 LAB — PHOSPHORUS: Phosphorus: 5.4 mg/dL — ABNORMAL HIGH (ref 2.5–4.6)

## 2019-09-23 LAB — GLUCOSE, CAPILLARY
Glucose-Capillary: 127 mg/dL — ABNORMAL HIGH (ref 70–99)
Glucose-Capillary: 141 mg/dL — ABNORMAL HIGH (ref 70–99)
Glucose-Capillary: 119 mg/dL — ABNORMAL HIGH (ref 70–99)
Glucose-Capillary: 117 mg/dL — ABNORMAL HIGH (ref 70–99)
Glucose-Capillary: 107 mg/dL — ABNORMAL HIGH (ref 70–99)
Glucose-Capillary: 114 mg/dL — ABNORMAL HIGH (ref 70–99)

## 2019-09-23 LAB — MAGNESIUM: Magnesium: 2.3 mg/dL (ref 1.7–2.4)

## 2019-09-23 LAB — URINALYSIS, COMPLETE (UACMP) WITH MICROSCOPIC
Bilirubin Urine: NEGATIVE
Glucose, UA: NEGATIVE mg/dL
Hgb urine dipstick: NEGATIVE
Ketones, ur: NEGATIVE mg/dL
Leukocytes,Ua: NEGATIVE
Nitrite: NEGATIVE
Protein, ur: NEGATIVE mg/dL
Specific Gravity, Urine: 1.015 (ref 1.005–1.030)
pH: 7.5 (ref 5.0–8.0)

## 2019-09-23 LAB — CBC
HCT: 29.3 % — ABNORMAL LOW (ref 39.0–52.0)
Hemoglobin: 9.3 g/dL — ABNORMAL LOW (ref 13.0–17.0)
MCH: 29.2 pg (ref 26.0–34.0)
MCHC: 31.7 g/dL (ref 30.0–36.0)
MCV: 91.8 fL (ref 80.0–100.0)
Platelets: 434 10*3/uL — ABNORMAL HIGH (ref 150–400)
RBC: 3.19 MIL/uL — ABNORMAL LOW (ref 4.22–5.81)
RDW: 13.9 % (ref 11.5–15.5)
WBC: 9.9 10*3/uL (ref 4.0–10.5)
nRBC: 0.2 % (ref 0.0–0.2)

## 2019-09-23 MED ORDER — DEXMEDETOMIDINE HCL IN NACL 400 MCG/100ML IV SOLN
0.4000 ug/kg/h | INTRAVENOUS | Status: DC
  Administered 2019-09-23 – 2019-09-24 (×4): 0.8 ug/kg/h via INTRAVENOUS
  Filled 2019-09-23 (×5): qty 100

## 2019-09-23 MED ORDER — CLONIDINE HCL 0.1 MG PO TABS
0.1000 mg | ORAL_TABLET | Freq: Three times a day (TID) | ORAL | Status: DC
  Administered 2019-09-23 (×2): 0.1 mg via ORAL
  Filled 2019-09-23 (×3): qty 1

## 2019-09-23 MED ORDER — CLONIDINE HCL 0.1 MG PO TABS
0.1000 mg | ORAL_TABLET | Freq: Three times a day (TID) | ORAL | Status: DC
  Administered 2019-09-23 – 2019-09-24 (×4): 0.1 mg
  Filled 2019-09-23 (×3): qty 1

## 2019-09-23 NOTE — Progress Notes (Signed)
Patient ID: Joshua [name], male   DOB: [birth year], 38 y.o.   MRN: [id number] Exam unchanged.  Moves left side.  A phasic.  Right hemiplegic.  Stable.

## 2019-09-23 NOTE — Progress Notes (Signed)
Spot checked of temperature and it was 100.5. Replaced Ice packs, fans and uncovered patient. Also gave PRN Medications. Will continue to monitor.

## 2019-09-23 NOTE — Progress Notes (Signed)
STROKE TEAM PROGRESS NOTE   INTERVAL HISTORY Lethargic but eyes open, not following commands, still has right hemiplegia.  No neuro changes. No events overnight.   OBJECTIVE Vitals:   09/23/19 1600 09/23/19 1700 09/23/19 1800 09/23/19 1829  BP: 127/75 (!) 94/45 (!) 117/54   Pulse: (!) 103 (!) 110 86   Resp: 18 (!) 21 17   Temp: 99 F (37.2 C)   (!) 100.5 F (38.1 C)  TempSrc: Axillary   Oral  SpO2: 96% 95% 96%   Weight:      Height:        CBC:  Recent Labs  Lab 09/20/19 0536 09/21/19 0450 09/22/19 0543 09/23/19 0706  WBC 16.3* 11.3* 10.9* 9.9  NEUTROABS 12.4* 9.0*  --   --   HGB 8.5* 7.8* 8.4* 9.3*  HCT 28.2* 23.6* 27.6* 29.3*  MCV 97.9 91.1 94.5 91.8  PLT 361 316 399 434*    Basic Metabolic Panel:  Recent Labs  Lab 09/22/19 0543 09/23/19 0706  NA 148* 144  K 4.6 4.1  CL 112* 111  CO2 26 23  GLUCOSE 144* 121*  BUN 52* 51*  CREATININE 2.36* 2.27*  CALCIUM 9.2 9.2  MG 2.5* 2.3  PHOS 6.4* 5.4*    Lipid Panel:     Component Value Date/Time   CHOL 134 09/16/2019 0613   TRIG 101 09/16/2019 0613   HDL 28 (L) 09/16/2019 0613   CHOLHDL 4.8 09/16/2019 0613   VLDL 20 09/16/2019 0613   LDLCALC 86 09/16/2019 0613   HgbA1c:  Lab Results  Component Value Date   HGBA1C 5.6 09/10/2019   Urine Drug Screen:     Component Value Date/Time   LABOPIA NONE DETECTED 09/09/2019 0349   COCAINSCRNUR NONE DETECTED 09/09/2019 0349   LABBENZ NONE DETECTED 09/09/2019 0349   AMPHETMU NONE DETECTED 09/09/2019 0349   THCU NONE DETECTED 09/09/2019 0349   LABBARB NONE DETECTED 09/09/2019 0349    Alcohol Level     Component Value Date/Time   ETH <10 09/09/2019 0407    IMAGING  Ct Head Code Stroke Wo Contrast 09/09/2019 1. 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  2. Intraventricular extension of hemorrhage. Small focus of intraparenchymal hemorrhage in the  right basal ganglia.   CT head  CTA Head  09/10/2019 1. Status post left pterional craniectomy for evacuation of left basal ganglia hematoma. Residual hematoma with interspersed postoperative gas measures 6.1 x 2.9 cm. 2. Decreased midline shift, now measuring 6 mm to the right. 3. Unchanged punctate focus of hemorrhage at the posterior right insula. 4. Unchanged intraventricular blood. 5. No intracranial arterial occlusion or high-grade stenosis. No aneurysm or vascular malformation.   CT head  09/13/2019 Stable changes post left frontoparietal craniectomy for evacuation of left-sided central hemorrhagic stroke. Residual without significant change measuring 2.8 x 6.5 cm with less air within the hemorrhage. Stable postsurgical change with mild edema/mass effect and stable midline shift to the right of 5-6 mm. Decrease in density 4 mm focus of hemorrhage over the right basal ganglia.  CT Head W/O Contrast 09/17/19 IMPRESSION: 1. Unchanged size of intraparenchymal hematoma centered in the left basal ganglia with unchanged surrounding edema and mass effect. 2. Unchanged rightward midline shift, measuring 6 mm.  Dg Chest Port 1 View 09/18/2019 1. Endotracheal tube, feeding tube in stable position. PICC line tip noted over the right atrium. 2.  Stable cardiomegaly. 3.  Low lung volumes with right base atelectasis/infiltrate. 09/17/2019 1. Lungs now clear. 2. No new abnormalities. 3. Stable support apparatus. 09/16/2019 1. No acute cardiopulmonary disease. Right lung base atelectasis has improved. 2. Support apparatus is stable and well positioned. 09/15/2019 1.  Lines and tubes in good anatomic position. 2.  Progressive right lower lobe atelectasis/infiltrate. 09/10/2019 Resolving right midlung atelectasis. Tubes and lines as described. 09/09/2019 No acute cardiopulmonary abnormality. Appropriate positioning of the endotracheal and transesophageal tubes.   Renal US 09/13/2019 1. Mildly  echogenic kidneys likely related to underlying medical renal disease. 2. Mild bilateral hydronephrosis.  No shadowing stone.  ECG - SR rate 97 BPM. LVH and prolonged QT interval. (See cardiology reading for complete details) Transthoracic Echocardiogram  09/16/2019 IMPRESSIONS 1. Left ventricular ejection fraction, by visual estimation, is 70 to 75%. The left ventricle has hyperdynamic function. Normal left ventricular size. There is severely increased left ventricular hypertrophy.  2. Global right ventricle has normal systolic function.The right ventricular size is normal. No increase in right ventricular wall thickness.  3. Left atrial size was normal.  4. Right atrial size was moderately dilated.  5. The mitral valve is grossly normal. Mild mitral valve regurgitation.  6. The tricuspid valve is grossly normal. Tricuspid valve regurgitation is trivial.  7. The aortic valve is tricuspid Aortic valve regurgitation was not visualized by color flow Doppler.  8. The pulmonic valve was grossly normal. Pulmonic valve regurgitation is not visualized by color flow Doppler.  9. The inferior vena cava was not able to be adequately assessed to estimate CVP.   PHYSICAL EXAM   Temp:  [98.7 F (37.1 C)-101.1 F (38.4 C)] 100.5 F (38.1 C) (09/26 1829) Pulse Rate:  [71-110] 86 (09/26 1800) Resp:  [13-26] 17 (09/26 1800) BP: (94-156)/(45-118) 117/54 (09/26 1800) SpO2:  [94 %-99 %] 96 % (09/26 1800) Weight:  [61 kg] 61 kg (09/26 0500)  General - Well nourished, well developed, off precedex, lethargic but less agitated than prior notes stated.   Ophthalmologic - fundi not visualized due to noncooperation.  Cardiovascular - Regular rate and rhythm.  Neuro - lethargic, eyes open, nonverbal, not following commands. Eyes left gaze preference, not blinking to visual threat on the right, not tracking, pupils reactive to light. Right facial droop.  Tongue protrusion not cooperative. Spontaneous movement  on the left against gravity. On pain stimulation, RUE 0/5 and RLE slight withdraw on pain. Increased muscle tone on right UE. DTR 3+ on the right and toes upgoing on the right. Sensation, coordination not cooperative and gait not tested.    ASSESSMENT/PLAN   [name] is a 38 y.o. male with history of admission for Etoh poisoning presenting with a seizure, vomiting.   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 In addition, small focus of R basal ganglia ICH.   NSG - decompressive craniectomy and hematoma evacuation w/ flap in R abd - 09/09/19 Dr Vertell Limber  Code Stroke CT Head - Large intraparenchymal hematoma of the left basal ganglia measuring 7.8 x 4.8 cm with 14 mm of rightward midline shift and early subfalcine herniation. Intraventricular extension of hemorrhage. Small focus of intraparenchymal hemorrhage in the right basal ganglia.   CT/CTA head - 09/10/2019 - Status post left pterional craniectomy for evacuation of left basal ganglia hematoma. Residual hematoma with interspersed postoperative gas measures 6.1 x 2.9 cm. Decreased midline shift, now measuring 6 mm to  the right. No intracranial arterial occlusion or high-grade stenosis. No aneurysm or vascular malformation  Ct head 9/16 and 9/20 stable crani. Stable 5-6 midline shift. Decreased R basal ganglia hemorrhage   HgbA1c 5.6  LDL 86  TTE 09/16/19 EF 70 to 75%. There is severely increased left ventricular hypertrophy.  [employer] Virus 2 - negative  UDS - negatived  VTE prophylaxis - Heparin 5000 units sq tid   No antithrombotic prior to admission, now on No antithrombotic d/t hemorrhage   Therapy recommendations: CIR recommended  Disposition:  Pending  Cerebral Edema  S/p crani and hematoma evacuation  Treated with 3% saline protocol, now off  Na - 158->161->157->156->157->151->153->152->149->148->144  Now Free  water 200 q4h   CT repeat 09/17/19 - Unchanged size of intraparenchymal hematoma and surrounding edema and mass effect.   PICC line out 9/25, for midline  Now 10 days out of onset, will allow gradually normalization of Na   Plan to d/c staples day #14 (9/26)  Respiratory Failure  Secondary to [place]  Off sedation and extubated 9/18  Re-intubated 9/18 due to low O2 sat and not protect airway  Extubated 9/23, tolerating well  Agitated when off precedex, CCM changing to enteral seroquel  CCM on board  VAP vs Aspiration PNA (vomited PTA) Leukocytosis, Febrile  WBC - 24.3->18.8->10.5->10.2->9.3->10.0->9.6->16.3->11.3->10.9->9.9  T Max - 101.7->afebrile ->100->101->99.1->100.4  U/A neg 9/14  Blood cultures normal flora  CXR progressive RLL atx/infilatrate -> CXR - 09/16/19 neg  D/c Vanc, d/c Zosyn -> Unasyn 9/17-9/20  CCM on board  Hypertensive Emergency  Probable undiagnosed / untreated hypertension (severe LVH on ECG)  Home BP meds: none  Current BP meds: amlodipine 10, hydralazine 50 tid, labetolol 200 tid    Off Lisinopril D/t increasing Cr  SBP goal < 160 mm Hg     Off cleviprex   BP stable   Seizure  On presentation  Hx of one seizure previously  On Keppra 500 mg Bid  AKI on CKD stage III  ACE stopped 9/14.   Renal US mild echogenic kidneys c/w CKD  Creatinine - 2.80->2.64->2.62->2.18->2.16->2.15->2.38->2.53->2.36->2.27  continue TF and free water  Alcohol abuse  Hx ETOH abuse  recent hospitalization for alcohol poisoning.   Drinks 1 pt Hennessey per day.   On FA/MVI/B1  On CIWA protocol.  Hyperlipidemia  Home Lipid lowering medication: none  LDL - 86  Current lipid lowering medication: None  May consider statin at discharge  Dysphagia Malnutrition . Secondary to stroke . NPO  Has cortrak and tube feeding @ 55  Speech consult re-ordered  Anemia of critical illness  Hgb 14.5- 10.7- 8.5 - 7.3 - 6.9 - PRBC -  7.8->7.1->7.6->7.6->7.8->8.5->7.8->8.4->9.9  Unclear etiology  On PPI BID  Close CBC monitoring  Other Stroke Risk Factors  Polysubstance abuse, UDS - negative 09/09/19  Other Active Problems  Hypokalemia - resolved 3.6->3.4 ->3.7->4.2->4.4 ->4.3->4.6->4.1  Mild Thrombocytopenia - resolved - 147->124->172->201->295->361 ->316->399->434  Hypocalcemia - resolved - 8.7->8.4->8.6->9.3->8.9->9.2  Phosphorus - 6.4->5.4 (recheck in AM)  Recurrent fever -100.4 ->U/A and culture ; CXR  Hospital day # 14 Personally examined patient and images, and have participated in and made any corrections needed to history, physical, neuro exam,assessment and plan as stated above.  I have personally obtained the history, evaluated lab date, reviewed imaging studies and agree with radiology interpretations.    Sarina Ill, MD Stroke Neurology   A total of 30 minutes was spent for the care of this patient, spent on counseling patient and family on  different diagnostic and therapeutic options, counseling and coordination of care, riskd ans benefits of management, compliance, or risk factor reduction and education.      To contact Stroke Continuity provider, please refer to [email or web address] After hours, contact General Neurology

## 2019-09-23 NOTE — Plan of Care (Signed)
  Problem: Education: Goal: Knowledge of General Education information will improve Description: Including pain rating scale, medication(s)/side effects and non-pharmacologic comfort measures Outcome: Progressing   Problem: Health Behavior/Discharge Planning: Goal: Ability to manage health-related needs will improve Outcome: Progressing   Problem: Clinical Measurements: Goal: Ability to maintain clinical measurements within normal limits will improve Outcome: Progressing Goal: Will remain free from infection Outcome: Progressing Goal: Diagnostic test results will improve Outcome: Progressing Goal: Respiratory complications will improve Outcome: Progressing Goal: Cardiovascular complication will be avoided Outcome: Progressing   Problem: Activity: Goal: Risk for activity intolerance will decrease Outcome: Progressing   Problem: Nutrition: Goal: Adequate nutrition will be maintained Outcome: Progressing   Problem: Coping: Goal: Level of anxiety will decrease Outcome: Progressing   Problem: Elimination: Goal: Will not experience complications related to bowel motility Outcome: Progressing Goal: Will not experience complications related to urinary retention Outcome: Progressing   Problem: Pain Managment: Goal: General experience of comfort will improve Outcome: Progressing   Problem: Safety: Goal: Ability to remain free from injury will improve Outcome: Progressing   Problem: Skin Integrity: Goal: Risk for impaired skin integrity will decrease Outcome: Progressing   Problem: Education: Goal: Knowledge of the prescribed therapeutic regimen will improve Outcome: Progressing   Problem: Clinical Measurements: Goal: Usual level of consciousness will be regained or maintained. Outcome: Progressing Goal: Neurologic status will improve Outcome: Progressing Goal: Ability to maintain intracranial pressure will improve Outcome: Progressing   Problem: Skin  Integrity: Goal: Demonstration of wound healing without infection will improve Outcome: Progressing   Problem: Education: Goal: Knowledge of disease or condition will improve Outcome: Progressing Goal: Knowledge of secondary prevention will improve Outcome: Progressing Goal: Knowledge of patient specific risk factors addressed and post discharge goals established will improve Outcome: Progressing Goal: Individualized Educational Video(s) Outcome: Progressing   Problem: Coping: Goal: Will verbalize positive feelings about self Outcome: Progressing Goal: Will identify appropriate support needs Outcome: Progressing   Problem: Health Behavior/Discharge Planning: Goal: Ability to manage health-related needs will improve Outcome: Progressing   Problem: Self-Care: Goal: Ability to participate in self-care as condition permits will improve Outcome: Progressing Goal: Ability to communicate needs accurately will improve Outcome: Progressing   Problem: Nutrition: Goal: Risk of aspiration will decrease Outcome: Progressing Goal: Dietary intake will improve Outcome: Progressing   Problem: Intracerebral Hemorrhage Tissue Perfusion: Goal: Complications of Intracerebral Hemorrhage will be minimized Outcome: Progressing

## 2019-09-23 NOTE — Progress Notes (Signed)
  Speech Language Pathology Treatment: Dysphagia  Patient Details Name: Macarthur Niziolek MRN: [id number] DOB: [birth year] Today's Date: 09/23/2019 Time: [id number] SLP Time Calculation (min) (ACUTE ONLY): 10 min  Assessment / Plan / Recommendation Clinical Impression  Pt had recently received seroquel and was resting quietly in bed. He aroused to name calling, opening his eyes but not making eye contact with therapist. He allowed SLP to part his lips to provide oral care and offer PO trials, but acceptance with both was passive. Secretions were pooling throughout his sulci and POs would fall anteriorly from his lips. No posterior transit was elicited; oral suction was used instead. Recommend that he continue to remain NPO with temporary, alternative means of nutrition. Will f/u for PO readiness and additional cognitive-linguistic tx.   HPI HPI: Pt is a 38 year old male who presented with new seizure, and was found to have a large left BG intracranial hemorrhage s/p L frontal temporal craniectomy with evacuation 9/12. ETT 9/12-9/18; 9/18-9/23. PMH: HTN, alcoholism, noncompliance      SLP Plan  Continue with current plan of care       Recommendations  Diet recommendations: NPO Medication Administration: Via alternative means                Oral Care Recommendations: Oral care QID Follow up Recommendations: Inpatient Rehab SLP Visit Diagnosis: Dysphagia, unspecified (R13.10) Plan: Continue with current plan of care       GO                Venita Sheffield Kaloni Bisaillon 09/23/2019, 10:51 AM  Pollyann Glen, M.A. [place] [facility] Environmental education officer [phone] Office [phone]

## 2019-09-23 NOTE — Plan of Care (Signed)
  Problem: Clinical Measurements: Goal: Respiratory complications will improve Outcome: Progressing   

## 2019-09-23 NOTE — Progress Notes (Signed)
PULMONARY / CRITICAL CARE MEDICINE   NAME:  Joshua [name], MRN:  [id number], DOB:  10-10-1981, LOS: 3 ADMISSION DATE:  09/09/2019, CONSULTATION DATE: 09/09/2019 REFERRING MD: Neurosurgery, CHIEF COMPLAINT: Vent dependent respiratory failure secondary to intracranial hemorrhage BRIEF HISTORY:    38 year old history of alcohol abuse seizures noncompliant hypertension who presented with new seizure right-sided hemiplegia confusion was found to have a large left intracranial hemorrhage for which he underwent craniectomy.  SIGNIFICANT PAST MEDICAL HISTORY   Hypertension Alcohol abuse  SIGNIFICANT EVENTS:  9/12 Admitted/  operating room for left frontal temporal craniectomy and evacuation hematoma 9/14 off hypertonic saline, fevers/ started on abx  9/15 ? Withdrawals?, started on precedex, cleviprex off overnight, weaned 8 hrs on SBT 5/5  9/16 decreasing Hgb, continues to wean well- mental status remains barrier 9/18 extubated > reintubated due to protect airway and low oxygen 9/23 > Extubated   STUDIES:   CT of head 09/09/2019 >1. 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 2. Intraventricular extension of hemorrhage. Small focus of intraparenchymal hemorrhage in the right basal ganglia. CT of head 9/13 > decreased size of left basal ganglia hematoma with expected postoperative air.  Mild improvement of associated mass-effect.  Redistribution of intraventricular blood. [place] 9/16 > 1.  Stable changes post left frontoparietal craniectomy for evacuation of left-sided central hemorrhagic stroke. Residual without significant change measuring 2.8 x 6.5 cm with less air within the hemorrhage. Stable postsurgical change with mild edema/mass effect and stable midline shift to the right of 5-6 mm. 2.  Decrease in density 4 mm focus of hemorrhage over the right basal ganglia. Renal US 9/16 > 1. Mildly  echogenic kidneys likely related to underlying medical renal disease. 2. Mild bilateral hydronephrosis.  No shadowing stone.  CULTURES:  09/09/2019 sputum>normal flora 9/12 MRSA PCR > neg 9/12 COVID > neg 9/14 sputum> normal flora  ANTIBIOTICS:  Vanc 9/14> 9/16 Pip-tazo 9/14>9/17 Unasyn 9/17 >9/21  LINES/TUBES:  ETT > 9/12- 9/18 re-intubated 9/18-9/23 Right radial A-line 9/12 > 9/15 L cortrak 9/14 >> Right Brachial PICC 9/14 >>   CONSULTANTS:  09/09/2019 pulmonary critical care 09/09/2019 neurology 09/09/2019 NSGY  SUBJECTIVE:  Extubated 9/23. Does not follow commands  Precedex is off 9/25, but restarted secondary to agitation. Still on 0.4 Started on Seroquel 9/24  CONSTITUTIONAL: BP (!) 151/118 Comment: PRN BP meds given  Pulse 93   Temp 99.8 F (37.7 C) (Axillary)   Resp (!) 21   Ht 5\' 9"  (1.753 m)   Wt 61 kg   SpO2 96%   BMI 19.86 kg/m   I/O last 3 completed shifts: In: 3301.7 [I.V.:376.7; NG/GT:2925] Out: 3025 [Urine:3025]    PHYSICAL EXAM: General Appearance: young adult male, no distress  HEENT: small bore NG in place, surgical site noted to lateral skull , staples intact Lungs: Bilateral chest excursion, clear breath sounds, no wheeze/crackles ,  Cardiovascular: S1, S2, RRR, no MRG  Abdomen: soft and non-tender, ND, active bowel sounds Musculoskeletal: -edema , no obvious deformities noted, spontaneously moves left LE Neuro: alert, pupils intact, does not follow commands, withdrawals on left, no withdrawal noted to right upper and lower ,   RESOLVED PROBLEM LIST  Thrombocytopenia  ASSESSMENT AND PLAN    Respiratory Insufficieny in setting of ICH Acute Hypoxic Respiratory Distress in setting of Aspiration PNA >> Resolving, s/p 8 days of Antibiotics  Plan -Extubated 9/23, continues to protect airway with no  distress if fails will need tracheostomy  -Continue Aggressive Pulmonary Hygiene - Trend CXR prn  Lt BG ICH with midline shift and subfalcine  herniation complicated by cerebral edema and seizures s/p Craniectomy with evacuation  -Treated with 3%, now off  Plan  -Neurology, neurosurgery Following  -Remove staples on day 14 -Continue Keppra BID  -PT/OT/ST when able   Hypertensive Emergency  Plan  -Cardiac Monitoring  -goal SBP < 160 -continue norvasc, hydralazine, labetalol  Acute on Chronic Kidney Disease Stage 3 - FeNa suggestive prerenal, renal US c/w CKD, mild hydroneprhosis without stone Mild hyperchloremic acidosis >> Improving  Hypernatremia- initially iatrogenic with hypertonic saline, stopped 9/14 >> Improving  Plan -Trend BMP  -Continue Free Water 200 q4h - Replete electrolytes prn   Polysubstance/ ETOH abuse - UDS negative - Reportedly drinks Hennessey 6 shots to 1pt a day Plan  - continue thiamine, folic acid, MVI - goal RASS 0 to -1 with precedex; will start clonidine today and try to wean precedex off  Agitation Started on Seroquel 9/24 Precedex weaned to off  9/25, then restarted 2/2 agitation Plan Seroquel increased Add clonidine 0.1 TID, wean precedex off. When stable, transfer to progressive Continue to monitor  Dysphagia  Plan -continue TF   Hyperglycemia Plan - CBG Q 4 - SSI  Anemia of critical illness , s/p 1U PRBC 9/17 Plan - f/u CBC - Monitor for active bleeding - transfuse for Hb < 7 or significant bleeding  Pt is off precedex , will transfer to neuro progressive and have [facility] assume medical care. Neuro will continue to manage neuro care  Best Practice / Goals of Care / Disposition.    DVT PROPHYLAXIS: SCDs, [place] heparin  GI prophylaxis: PPI BID per tube Pain agitation: RASS goal 0/-1, precedex NUTRITION: NPO/ tube feedings at goal  MOBILITY: Bedrest GOALS OF CARE: Full code Foley catheter: condom catheter FAMILY DISCUSSIONS: Sister and fianc updated DISPOSITION Will transfer to progressive Neuro Floor when precedex off.  I have independently seen and examined the  patient, reviewed data, and developed an assessment and plan. A total of 36 minutes were spent in critical care assessment and medical decision making. This critical care time does not reflect procedure time, or teaching time or supervisory time of PA/NP/Med student/Med Resident, etc but could involve care discussion time.  Bonna Gains, MD PhD 09/23/19   9:20 AM

## 2019-09-24 LAB — COMPREHENSIVE METABOLIC PANEL
ALT: 29 U/L (ref 0–44)
AST: 27 U/L (ref 15–41)
Albumin: 2.8 g/dL — ABNORMAL LOW (ref 3.5–5.0)
Alkaline Phosphatase: 54 U/L (ref 38–126)
Anion gap: 12 (ref 5–15)
BUN: 58 mg/dL — ABNORMAL HIGH (ref 6–20)
CO2: 23 mmol/L (ref 22–32)
Calcium: 9.7 mg/dL (ref 8.9–10.3)
Chloride: 111 mmol/L (ref 98–111)
Creatinine, Ser: 2.54 mg/dL — ABNORMAL HIGH (ref 0.61–1.24)
GFR calc Af Amer: 35 mL/min — ABNORMAL LOW (ref >60)
GFR calc non Af Amer: 31 mL/min — ABNORMAL LOW (ref >60)
Glucose, Bld: 130 mg/dL — ABNORMAL HIGH (ref 70–99)
Potassium: 4.9 mmol/L (ref 3.5–5.1)
Sodium: 146 mmol/L — ABNORMAL HIGH (ref 135–145)
Total Bilirubin: 0.3 mg/dL (ref 0.3–1.2)
Total Protein: 7.5 g/dL (ref 6.5–8.1)

## 2019-09-24 LAB — GLUCOSE, CAPILLARY
Glucose-Capillary: 119 mg/dL — ABNORMAL HIGH (ref 70–99)
Glucose-Capillary: 112 mg/dL — ABNORMAL HIGH (ref 70–99)
Glucose-Capillary: 135 mg/dL — ABNORMAL HIGH (ref 70–99)
Glucose-Capillary: 125 mg/dL — ABNORMAL HIGH (ref 70–99)
Glucose-Capillary: 112 mg/dL — ABNORMAL HIGH (ref 70–99)
Glucose-Capillary: 119 mg/dL — ABNORMAL HIGH (ref 70–99)

## 2019-09-24 LAB — CBC
HCT: 30.8 % — ABNORMAL LOW (ref 39.0–52.0)
Hemoglobin: 10.2 g/dL — ABNORMAL LOW (ref 13.0–17.0)
MCH: 30 pg (ref 26.0–34.0)
MCHC: 33.1 g/dL (ref 30.0–36.0)
MCV: 90.6 fL (ref 80.0–100.0)
Platelets: 427 10*3/uL — ABNORMAL HIGH (ref 150–400)
RBC: 3.4 MIL/uL — ABNORMAL LOW (ref 4.22–5.81)
RDW: 13.9 % (ref 11.5–15.5)
WBC: 8.1 10*3/uL (ref 4.0–10.5)
nRBC: 0.2 % (ref 0.0–0.2)

## 2019-09-24 LAB — PHOSPHORUS: Phosphorus: 5.8 mg/dL — ABNORMAL HIGH (ref 2.5–4.6)

## 2019-09-24 NOTE — Progress Notes (Signed)
PULMONARY / CRITICAL CARE MEDICINE   NAME:  Joshua [name], MRN:  [id number], DOB:  1981-02-02, LOS: 32 ADMISSION DATE:  09/09/2019, CONSULTATION DATE: 09/09/2019 REFERRING MD: Neurosurgery, CHIEF COMPLAINT: Vent dependent respiratory failure secondary to intracranial hemorrhage BRIEF HISTORY:    38 year old history of alcohol abuse seizures noncompliant hypertension who presented with new seizure right-sided hemiplegia confusion was found to have a large left intracranial hemorrhage for which he underwent craniectomy.  SIGNIFICANT PAST MEDICAL HISTORY   Hypertension Alcohol abuse  SIGNIFICANT EVENTS:  9/12 Admitted/  operating room for left frontal temporal craniectomy and evacuation hematoma 9/14 off hypertonic saline, fevers/ started on abx  9/15 ? Withdrawals?, started on precedex, cleviprex off overnight, weaned 8 hrs on SBT 5/5  9/16 decreasing Hgb, continues to wean well- mental status remains barrier 9/18 extubated > reintubated due to protect airway and low oxygen 9/23 > Extubated   STUDIES:   CT of head 09/09/2019 >1. 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 2. Intraventricular extension of hemorrhage. Small focus of intraparenchymal hemorrhage in the right basal ganglia. CT of head 9/13 > decreased size of left basal ganglia hematoma with expected postoperative air.  Mild improvement of associated mass-effect.  Redistribution of intraventricular blood. [place] 9/16 > 1.  Stable changes post left frontoparietal craniectomy for evacuation of left-sided central hemorrhagic stroke. Residual without significant change measuring 2.8 x 6.5 cm with less air within the hemorrhage. Stable postsurgical change with mild edema/mass effect and stable midline shift to the right of 5-6 mm. 2.  Decrease in density 4 mm focus of hemorrhage over the right basal ganglia. Renal US 9/16 > 1. Mildly  echogenic kidneys likely related to underlying medical renal disease. 2. Mild bilateral hydronephrosis.  No shadowing stone.  CULTURES:  09/09/2019 sputum>normal flora 9/12 MRSA PCR > neg 9/12 COVID > neg 9/14 sputum> normal flora  ANTIBIOTICS:  Vanc 9/14> 9/16 Pip-tazo 9/14>9/17 Unasyn 9/17 >9/21  LINES/TUBES:  ETT > 9/12- 9/18 re-intubated 9/18-9/23 Right radial A-line 9/12 > 9/15 L cortrak 9/14 >> Right Brachial PICC 9/14 >>   CONSULTANTS:  09/09/2019 pulmonary critical care 09/09/2019 neurology 09/09/2019 NSGY  SUBJECTIVE:  Extubated 9/23. Does not follow commands  Precedex is off 9/25, but restarted secondary to agitation. Still on 0.4 Started on Seroquel 9/24  CONSTITUTIONAL: BP (!) 136/97   Pulse 87   Temp 99.3 F (37.4 C) (Axillary)   Resp 16   Ht 5\' 9"  (1.753 m)   Wt 61.1 kg   SpO2 97%   BMI 19.89 kg/m   I/O last 3 completed shifts: In: 2326.2 [I.V.:401.2; NG/GT:1925] Out: [id number] [Urine:4525; Stool:50]    PHYSICAL EXAM: General Appearance: young adult male, no distress  HEENT: small bore NG in place, surgical site noted to lateral skull , staples intact Lungs: Bilateral chest excursion, CTAB, no wheeze/crackles  Cardiovascular: S1, S2, RRR, no MRG  Abdomen: soft and non-tender (except RLQ where bone flap is sequestered), ND, active bowel sounds Musculoskeletal: -edema , no obvious deformities noted, spontaneously moves left LE Neuro:pupils intact, does not follow commands, withdrawals on left, no withdrawal noted to right upper and lower , increasing contracture in RUE at wrist and elbow.   RESOLVED PROBLEM LIST  Thrombocytopenia  ASSESSMENT AND PLAN    Respiratory Insufficieny in setting of ICH Acute Hypoxic Respiratory Distress in setting of Aspiration PNA >> Resolving, s/p 8 days of Antibiotics  Plan -Extubated 9/23,  continues to protect airway with no distress if fails will need tracheostomy  -Continue Aggressive Pulmonary Hygiene - Trend CXR  prn  Lt BG ICH with midline shift and subfalcine herniation complicated by cerebral edema and seizures s/p Craniectomy with evacuation  -Treated with 3%, now off  Plan  -Neurology, neurosurgery Following  -Remove staples on day 14 -Continue Keppra BID  -PT/OT/ST when able; would get OT to eval RUE and splint, etc as indicated   Hypertensive Emergency  Plan  -Cardiac Monitoring  -goal SBP < 160 -continue norvasc, hydralazine, labetalol  Acute on Chronic Kidney Disease Stage 3 - FeNa suggestive prerenal, renal US c/w CKD, mild hydroneprhosis without stone Mild hyperchloremic acidosis >> Improving  Hypernatremia- initially iatrogenic with hypertonic saline, stopped 9/14 >> Improving  Plan -Trend BMP  -Continue Free Water 200 q4h - Replete electrolytes prn   Polysubstance/ ETOH abuse - UDS negative - Reportedly drinks Hennessey 6 shots to 1pt a day Plan  - continue thiamine, folic acid, MVI - goal RASS 0 to -1 with precedex; will start clonidine today and try to wean precedex off  Agitation Started on Seroquel 9/24 Precedex weaned to off  9/25, then restarted 2/2 agitation Plan Seroquel increased 9/25 Added clonidine 0.1 TID 9/26. Will continue to try to wean precedex off. When stable, transfer to progressive Continue to monitor  Dysphagia  Plan -continue TF; PEG in near future  Hyperglycemia Plan - CBG Q 4 - SSI  Anemia of critical illness , s/p 1U PRBC 9/17 Plan - f/u CBC - Monitor for active bleeding - transfuse for Hb < 7 or significant bleeding    Best Practice / Goals of Care / Disposition.    DVT PROPHYLAXIS: SCDs, [place] heparin  GI prophylaxis: PPI BID per tube Pain agitation: RASS goal 0/-1, precedex-->clondine, seroquel, other NUTRITION: NPO/ tube feedings at goal  MOBILITY: Bedrest GOALS OF CARE: Full code Foley catheter: condom catheter FAMILY DISCUSSIONS: Sister and fianc updated DISPOSITION Will transfer to progressive Neuro Floor when  precedex off.  I have independently seen and examined the patient, reviewed data, and developed an assessment and plan. A total of 34 minutes were spent in critical care assessment and medical decision making. This critical care time does not reflect procedure time, or teaching time or supervisory time of PA/NP/Med student/Med Resident, etc but could involve care discussion time.  Bonna Gains, MD PhD 09/24/19   9:08 AM

## 2019-09-24 NOTE — Plan of Care (Signed)
  Problem: Skin Integrity: Goal: Demonstration of wound healing without infection will improve Outcome: Progressing   

## 2019-09-24 NOTE — Progress Notes (Signed)
Patient ID: Joshua [name], male   DOB: [birth year], 38 y.o.   MRN: [id number] No change in exam.  Following

## 2019-09-24 NOTE — Progress Notes (Signed)
STROKE TEAM PROGRESS NOTE   INTERVAL HISTORY Lethargic but eyes open, not following commands, still has right hemiplegia.  No neuro changes. No events overnight.   OBJECTIVE Vitals:   09/24/19 0400 09/24/19 0500 09/24/19 0600 09/24/19 0700  BP: (!) 136/98 (!) 138/93 (!) 137/105 139/87  Pulse: 76 87 87   Resp: 16 18 16    Temp: 99.4 F (37.4 C)     TempSrc: Oral     SpO2: 99% 97% 97%   Weight:  61.1 kg    Height:        CBC:  Recent Labs  Lab 09/20/19 0536 09/21/19 0450  09/23/19 0706 09/24/19 0557  WBC 16.3* 11.3*   < > 9.9 8.1  NEUTROABS 12.4* 9.0*  --   --   --   HGB 8.5* 7.8*   < > 9.3* 10.2*  HCT 28.2* 23.6*   < > 29.3* 30.8*  MCV 97.9 91.1   < > 91.8 90.6  PLT 361 316   < > 434* 427*   < > = values in this interval not displayed.    Basic Metabolic Panel:  Recent Labs  Lab 09/22/19 0543 09/23/19 0706 09/24/19 0557  NA 148* 144 146*  K 4.6 4.1 4.9  CL 112* 111 111  CO2 26 23 23   GLUCOSE 144* 121* 130*  BUN 52* 51* 58*  CREATININE 2.36* 2.27* 2.54*  CALCIUM 9.2 9.2 9.7  MG 2.5* 2.3  --   PHOS 6.4* 5.4* 5.8*    Lipid Panel:     Component Value Date/Time   CHOL 134 09/16/2019 0613   TRIG 101 09/16/2019 0613   HDL 28 (L) 09/16/2019 0613   CHOLHDL 4.8 09/16/2019 0613   VLDL 20 09/16/2019 0613   LDLCALC 86 09/16/2019 0613   HgbA1c:  Lab Results  Component Value Date   HGBA1C 5.6 09/10/2019   Urine Drug Screen:     Component Value Date/Time   LABOPIA NONE DETECTED 09/09/2019 0349   COCAINSCRNUR NONE DETECTED 09/09/2019 0349   LABBENZ NONE DETECTED 09/09/2019 0349   AMPHETMU NONE DETECTED 09/09/2019 0349   THCU NONE DETECTED 09/09/2019 0349   LABBARB NONE DETECTED 09/09/2019 0349    Alcohol Level     Component Value Date/Time   ETH <10 09/09/2019 0407    IMAGING  Ct Head Code Stroke Wo Contrast 09/09/2019 1. Large intraparenchymal hematoma of the left basal ganglia measuring 7.8 x 4.8 cm with 14 mm of rightward midline shift and early  subfalcine herniation.  2. Intraventricular extension of hemorrhage. Small focus of intraparenchymal hemorrhage in the right basal ganglia.   CT head  CTA Head  09/10/2019 1. Status post left pterional craniectomy for evacuation of left basal ganglia hematoma. Residual hematoma with interspersed postoperative gas measures 6.1 x 2.9 cm. 2. Decreased midline shift, now measuring 6 mm to the right. 3. Unchanged punctate focus of hemorrhage at the posterior right insula. 4. Unchanged intraventricular blood. 5. No intracranial arterial occlusion or high-grade stenosis. No aneurysm or vascular malformation.   CT head  09/13/2019 Stable changes post left frontoparietal craniectomy for evacuation of left-sided central hemorrhagic stroke. Residual without significant change measuring 2.8 x 6.5 cm with less air within the hemorrhage. Stable postsurgical change with mild edema/mass effect and stable midline shift to the right of 5-6 mm. Decrease in density 4 mm focus of hemorrhage over the right basal ganglia.  CT Head W/O Contrast 09/17/19 IMPRESSION: 1. Unchanged size of intraparenchymal hematoma centered in the left basal  ganglia with unchanged surrounding edema and mass effect. 2. Unchanged rightward midline shift, measuring 6 mm.  Dg Chest Port 1 View 09/18/2019 1. Endotracheal tube, feeding tube in stable position. PICC line tip noted over the right atrium. 2.  Stable cardiomegaly. 3.  Low lung volumes with right base atelectasis/infiltrate. 09/17/2019 1. Lungs now clear. 2. No new abnormalities. 3. Stable support apparatus. 09/16/2019 1. No acute cardiopulmonary disease. Right lung base atelectasis has improved. 2. Support apparatus is stable and well positioned. 09/15/2019 1.  Lines and tubes in good anatomic position. 2.  Progressive right lower lobe atelectasis/infiltrate. 09/10/2019 Resolving right midlung atelectasis. Tubes and lines as described. 09/09/2019 No acute cardiopulmonary  abnormality. Appropriate positioning of the endotracheal and transesophageal tubes.  09/23/19 There has been interval endotracheal extubation. There is no acute appearing airspace opacity. Cardiomegaly.  Renal US 09/13/2019 1. Mildly echogenic kidneys likely related to underlying medical renal disease. 2. Mild bilateral hydronephrosis.  No shadowing stone.  ECG - SR rate 97 BPM. LVH and prolonged QT interval. (See cardiology reading for complete details) Transthoracic Echocardiogram  09/16/2019 IMPRESSIONS 1. Left ventricular ejection fraction, by visual estimation, is 70 to 75%. The left ventricle has hyperdynamic function. Normal left ventricular size. There is severely increased left ventricular hypertrophy.  2. Global right ventricle has normal systolic function.The right ventricular size is normal. No increase in right ventricular wall thickness.  3. Left atrial size was normal.  4. Right atrial size was moderately dilated.  5. The mitral valve is grossly normal. Mild mitral valve regurgitation.  6. The tricuspid valve is grossly normal. Tricuspid valve regurgitation is trivial.  7. The aortic valve is tricuspid Aortic valve regurgitation was not visualized by color flow Doppler.  8. The pulmonic valve was grossly normal. Pulmonic valve regurgitation is not visualized by color flow Doppler.  9. The inferior vena cava was not able to be adequately assessed to estimate CVP.   PHYSICAL EXAM   Temp:  [99 F (37.2 C)-101.1 F (38.4 C)] 99.4 F (37.4 C) (09/27 0400) Pulse Rate:  [75-110] 87 (09/27 0600) Resp:  [14-23] 16 (09/27 0600) BP: (94-151)/(45-114) 139/87 (09/27 0700) SpO2:  [94 %-99 %] 97 % (09/27 0600) Weight:  [61.1 kg] 61.1 kg (09/27 0500)  General - Well nourished, well developed, off precedex, lethargic but less agitated than prior notes stated.   Ophthalmologic - fundi not visualized due to noncooperation.  Cardiovascular - Regular rate and rhythm.  Neuro -  lethargic, eyes open, nonverbal, not following commands. Eyes left gaze preference, blinking to visual threat on the left, not tracking, pupils reactive to light. Right facial droop.  Tongue protrusion not cooperative. Spontaneous movement on the left against gravity. On pain stimulation, RUE 0/5 and RLE slight withdraw on pain. Significantly increased muscle tone on right UE. DTR 3+ on the right and toes upgoing on the right. Sensation, coordination not cooperative and gait not tested.    ASSESSMENT/PLAN   [name] is a 38 y.o. male with history of admission for Etoh poisoning presenting with a seizure, vomiting.   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 In addition, small focus of R basal ganglia ICH.   NSG - decompressive craniectomy and hematoma evacuation w/ flap in R abd - 09/09/19 Dr Vertell Limber  Code Stroke CT Head - Large intraparenchymal hematoma of the left basal ganglia measuring 7.8 x 4.8 cm with 14 mm  of rightward midline shift and early subfalcine herniation. Intraventricular extension of hemorrhage. Small focus of intraparenchymal hemorrhage in the right basal ganglia.   CT/CTA head - 09/10/2019 - Status post left pterional craniectomy for evacuation of left basal ganglia hematoma. Residual hematoma with interspersed postoperative gas measures 6.1 x 2.9 cm. Decreased midline shift, now measuring 6 mm to the right. No intracranial arterial occlusion or high-grade stenosis. No aneurysm or vascular malformation  Ct head 9/16 and 9/20 stable crani. Stable 5-6 midline shift. Decreased R basal ganglia hemorrhage   HgbA1c 5.6  LDL 86  TTE 09/16/19 EF 70 to 75%. There is severely increased left ventricular hypertrophy.  [employer] Virus 2 - negative  UDS - negatived  VTE prophylaxis - Heparin 5000 units sq tid   No antithrombotic prior to admission, now on No antithrombotic d/t  hemorrhage   Therapy recommendations: CIR recommended  Disposition:  Pending  Cerebral Edema  S/p crani and hematoma evacuation  Treated with 3% saline protocol, now off  Na - 158->161->157->156->157->151->153->152->149->148->144->146  Now Free water 200 q4h   CT repeat 09/17/19 - Unchanged size of intraparenchymal hematoma and surrounding edema and mass effect.   PICC line out 9/25, for midline  Now 10 days out of onset, will allow gradually normalization of Na   Plan to d/c staples day #14 (9/26) -> NS on board  Respiratory Failure  Secondary to [place]  Off sedation and extubated 9/18  Re-intubated 9/18 due to low O2 sat and not protect airway  Extubated 9/23, tolerating well  Agitated when off precedex, CCM changing to enteral seroquel  CCM on board  VAP vs Aspiration PNA (vomited PTA) Leukocytosis, Febrile  WBC - 24.3->18.8->10.5->10.2->9.3->10.0->9.6->16.3->11.3->10.9->9.9->8.1  T Max - 101.7->afebrile ->100->101->99.1->100.4->101.1  U/A neg 9/14  Blood cultures normal flora  CXR progressive RLL atx/infilatrate -> CXR - 09/16/19 neg  D/c Vanc, d/c Zosyn -> Unasyn 9/17-9/20 (Now off all antibiotics)  CCM on board  Hypertensive Emergency  Probable undiagnosed / untreated hypertension (severe LVH on ECG)  Home BP meds: none  Current BP meds: amlodipine 10, hydralazine 50 tid, labetolol 200 tid    Off Lisinopril D/t increasing Cr  SBP goal < 160 mm Hg     Off cleviprex   BP stable   Seizure  On presentation  Hx of one seizure previously  On Keppra 500 mg Bid  AKI on CKD stage III  ACE stopped 9/14.   Renal US mild echogenic kidneys c/w CKD  Creatinine - 2.80->2.64->2.62->2.18->2.16->2.15->2.38->2.53->2.36->2.27->2.54  Continue TF and free water - consider increasing   Alcohol abuse  Hx ETOH abuse  recent hospitalization for alcohol poisoning.   Drinks 1 pt Hennessey per day.   On FA/MVI/B1  On CIWA  protocol.  Hyperlipidemia  Home Lipid lowering medication: none  LDL - 86  Current lipid lowering medication: None  May consider statin at discharge  Dysphagia Malnutrition . Secondary to stroke . NPO  Has cortrak and tube feeding @ 55  Speech consult re-ordered  Anemia of critical illness  Hgb 14.5- 10.7- 8.5 - 7.3 - 6.9 - PRBC - 7.8->7.1->7.6->7.6->7.8->8.5->7.8->8.4->9.9->10.2  Unclear etiology  On PPI BID  Close CBC monitoring  Other Stroke Risk Factors  Polysubstance abuse, UDS - negative 09/09/19  Other Active Problems  Hypokalemia - resolved 3.6->3.4 ->3.7->4.2->4.4 ->4.3->4.6->4.1->4.9  Mild Thrombocytopenia - resolved - 147->124->172->201->295->361 ->316->399->434->427  Hypocalcemia - resolved - 8.7->8.4->8.6->9.3->8.9->9.2->9.7  Phosphorus - 6.4->5.4->5.8  Recurrent fever -100.4 ->101.1  U/A and culture -> U/A - negative ; Culture - pending  CXR -> IMPRESSION: There is no acute appearing airspace opacity. Cardiomegaly.  Significant increased tone on the right, can see if OT will come and splint or other treatment.   Hospital day # 86 Personally examined patient and images, and have participated in and made any corrections needed to history, physical, neuro exam,assessment and plan as stated above.  I have personally obtained the history, evaluated lab date, reviewed imaging studies and agree with radiology interpretations.    A total of 25 minutes was spent for the care of this patient, spent on counseling patient and family on different diagnostic and therapeutic options, counseling and coordination of care, riskd ans benefits of management, compliance, or risk factor reduction and education.      To contact Stroke Continuity provider, please refer to [email or web address] After hours, contact General Neurology

## 2019-09-25 DIAGNOSIS — I611 Nontraumatic intracerebral hemorrhage in hemisphere, cortical: Secondary | ICD-10-CM

## 2019-09-25 DIAGNOSIS — J9600 Acute respiratory failure, unspecified whether with hypoxia or hypercapnia: Secondary | ICD-10-CM | POA: Diagnosis present

## 2019-09-25 DIAGNOSIS — J9602 Acute respiratory failure with hypercapnia: Secondary | ICD-10-CM | POA: Diagnosis present

## 2019-09-25 LAB — URINE CULTURE: Culture: >=100000 — AB

## 2019-09-25 LAB — GLUCOSE, CAPILLARY
Glucose-Capillary: 115 mg/dL — ABNORMAL HIGH (ref 70–99)
Glucose-Capillary: 129 mg/dL — ABNORMAL HIGH (ref 70–99)
Glucose-Capillary: 120 mg/dL — ABNORMAL HIGH (ref 70–99)
Glucose-Capillary: 135 mg/dL — ABNORMAL HIGH (ref 70–99)
Glucose-Capillary: 128 mg/dL — ABNORMAL HIGH (ref 70–99)
Glucose-Capillary: 123 mg/dL — ABNORMAL HIGH (ref 70–99)

## 2019-09-25 MED ORDER — PNEUMOCOCCAL VAC POLYVALENT 25 MCG/0.5ML IJ INJ
0.5000 mL | INJECTION | INTRAMUSCULAR | Status: AC
  Administered 2019-09-26: 0.5 mL via INTRAMUSCULAR
  Filled 2019-09-25: qty 0.5

## 2019-09-25 MED ORDER — CLONIDINE HCL 0.2 MG PO TABS
0.3000 mg | ORAL_TABLET | Freq: Four times a day (QID) | ORAL | Status: AC
  Administered 2019-09-25 (×3): 0.3 mg
  Filled 2019-09-25 (×3): qty 1

## 2019-09-25 MED ORDER — CLONIDINE HCL 0.2 MG PO TABS
0.2000 mg | ORAL_TABLET | Freq: Four times a day (QID) | ORAL | Status: AC
  Administered 2019-09-26 (×4): 0.2 mg
  Filled 2019-09-25 (×4): qty 1

## 2019-09-25 MED ORDER — CEFAZOLIN SODIUM-DEXTROSE 2-4 GM/100ML-% IV SOLN
2.0000 g | Freq: Two times a day (BID) | INTRAVENOUS | Status: DC
  Administered 2019-09-25 – 2019-09-26 (×4): 2 g via INTRAVENOUS
  Filled 2019-09-25 (×5): qty 100

## 2019-09-25 MED ORDER — CLONIDINE HCL 0.1 MG PO TABS
0.1000 mg | ORAL_TABLET | Freq: Two times a day (BID) | ORAL | Status: AC
  Administered 2019-09-27 – 2019-09-28 (×2): 0.1 mg
  Filled 2019-09-25 (×2): qty 1

## 2019-09-25 MED ORDER — CHLORHEXIDINE GLUCONATE 0.12 % MT SOLN
OROMUCOSAL | Status: AC
  Administered 2019-09-25: 15 mL via OROMUCOSAL
  Filled 2019-09-25: qty 15

## 2019-09-25 MED ORDER — CLONIDINE HCL 0.1 MG PO TABS
0.1000 mg | ORAL_TABLET | Freq: Four times a day (QID) | ORAL | Status: AC
  Administered 2019-09-26 – 2019-09-27 (×3): 0.1 mg
  Filled 2019-09-25 (×4): qty 1

## 2019-09-25 MED ORDER — CLONIDINE HCL 0.1 MG PO TABS
0.1000 mg | ORAL_TABLET | Freq: Every day | ORAL | Status: AC
  Administered 2019-09-29: 0.1 mg
  Filled 2019-09-25: qty 1

## 2019-09-25 NOTE — Progress Notes (Signed)
Inpatient Rehabilitation Admissions Coordinator  Weaning precedex. Not yet at a level to assess for a possible inpt rehab admit. I will follow his progress and tolerance to participate in therapies.  Danne Baxter, RN, MSN Rehab Admissions Coordinator [phone] 09/25/2019 12:54 PM

## 2019-09-25 NOTE — Progress Notes (Signed)
Physical Therapy Treatment Patient Details Name: Joshua [name] MRN: [id number] DOB: 09-16-1981 Today's Date: 09/25/2019    History of Present Illness This pt is a 38 year old with history of alcohol abuse, seizures, noncompliant hypertension who presented with new seizure right-sided hemiplegia and confusion was found to have a large left intracranial hemorrhage now s/p left frontoparietal craniectomy for evacuation of left basal ganglia hematoma on 09/09/19. Skull flap in abdomen.  Intubated 9/12 (day of admission) and extubated 09/15/19.     PT Comments    Pt remains non-verbal, doesn't track with eyes, R hemiplegia with increased tone, impaired processing, impaired sequencing, and requires totalAx2 for bed/EOB mobility. Pt using L UE purposefully to reach for NG tube and scratch eye, reach for balloon but not to command. Cont to recommend CIR upon d/c. Acute PT to cont to follow.    Follow Up Recommendations  CIR     Equipment Recommendations  Wheelchair (measurements PT);Wheelchair cushion (measurements PT);Hospital bed;3in1 (PT)    Recommendations for Other Services Rehab consult     Precautions / Restrictions Precautions Precautions: Fall Precaution Comments: L crani, bone flap removed and in abdomen, NG, L wrist restraint and mitt Restrictions Weight Bearing Restrictions: No    Mobility  Bed Mobility Overal bed mobility: Needs Assistance Bed Mobility: Supine to Sit;Sit to Supine     Supine to sit: Max assist;+2 for physical assistance Sit to supine: Max assist;+2 for physical assistance   General bed mobility comments: assist for trunk and LE management, impaired sequencing and decreased strength  Transfers                 General transfer comment: deferred  Ambulation/Gait             General Gait Details: deferred   Stairs             Wheelchair Mobility    Modified Rankin (Stroke Patients Only)       Balance Overall balance  assessment: Needs assistance Sitting-balance support: Feet supported Sitting balance-Leahy Scale: Zero Sitting balance - Comments: total assist for sitting balance, pt with strong R lateral lean, maxA to maintain head in extension                                     Cognition Arousal/Alertness: Awake/alert Behavior During Therapy: Restless;Impulsive Overall Cognitive Status: Difficult to assess                                 General Comments: pt with minimal command follow x 4 extremeties but moves L UE purposefully, no verbal communication, did initiate transfer to EOB, continuously trying to reach for NG tube with L UE      Exercises Other Exercises Other Exercises: PROM to bilat LE, pt very rigid/tight/resistant, espeically with knee extension in sitting, pt holding legs in adduction    General Comments General comments (skin integrity, edema, etc.): HR increased from 90s to 120s while sitting EOB      Pertinent Vitals/Pain Pain Assessment: Faces Faces Pain Scale: No hurt    Home Living                      Prior Function            PT Goals (current goals can now be found in the care plan section) Acute Rehab  PT Goals Patient Stated Goal: none stated Progress towards PT goals: Progressing toward goals    Frequency    Min 4X/week      PT Plan Current plan remains appropriate    Co-evaluation PT/OT/SLP Co-Evaluation/Treatment: Yes Reason for Co-Treatment: Complexity of the patient's impairments (multi-system involvement) PT goals addressed during session: Mobility/safety with mobility        AM-PAC PT "6 Clicks" Mobility   Outcome Measure  Help needed turning from your back to your side while in a flat bed without using bedrails?: Total Help needed moving from lying on your back to sitting on the side of a flat bed without using bedrails?: Total Help needed moving to and from a bed to a chair (including a  wheelchair)?: Total Help needed standing up from a chair using your arms (e.g., wheelchair or bedside chair)?: Total Help needed to walk in hospital room?: Total Help needed climbing 3-5 steps with a railing? : Total 6 Click Score: 6    End of Session   Activity Tolerance: Patient tolerated treatment well Patient left: in bed;with call bell/phone within reach;with bed alarm set;with family/visitor present Nurse Communication: Mobility status PT Visit Diagnosis: Muscle weakness (generalized) (M62.81);Difficulty in walking, not elsewhere classified (R26.2);Hemiplegia and hemiparesis Hemiplegia - Right/Left: Right Hemiplegia - dominant/non-dominant: Dominant Hemiplegia - caused by: Nontraumatic intracerebral hemorrhage     Time: 1350-1408 PT Time Calculation (min) (ACUTE ONLY): 18 min  Charges:  $Neuromuscular Re-education: 8-22 mins                     Kittie Plater, PT, DPT Acute Rehabilitation Services Pager #: [phone] Office #: [phone]    Berline Lopes 09/25/2019, 2:28 PM

## 2019-09-25 NOTE — Consult Note (Signed)
Reason for Consult: Need for enteral access after stroke Referring Physician: [name] is an 38 y.o. male.  HPI: 38 year old [name] with a history of alcohol abuse, seizures, and hypertension who presented with a large left intracranial hemorrhage.  He is status post left frontoparietal decompressive craniectomy for evacuation of left basal ganglia hematoma on 09/09/2019.  He remains unable to swallow safely and I was asked to place a PEG tube.  No past medical history on file.  Past Surgical History:  Procedure Laterality Date  . CRANIOTOMY Left 09/09/2019   Procedure: DECOMPRESSIVE CRANIECTOMY AND  HEMATOMA EVACUATION, IMPLANTATION OF SKULL FLAP IN RIGHT ABDOMEN;  Surgeon: Joshua Levine, MD;  Location: [name];  Service: Neurosurgery;  Laterality: Left;    No family history on file.  Social History:  has no history on file for tobacco, alcohol, and drug.  Allergies: No Known Allergies  Medications: I have reviewed the patient's current medications.  Results for orders placed or performed during the hospital encounter of 09/09/19 (from the past 48 hour(s))  Glucose, capillary     Status: Abnormal   Collection Time: 09/23/19  4:37 PM  Result Value Ref Range   Glucose-Capillary 117 (H) 70 - 99 mg/dL   Comment 1 Notify RN    Comment 2 Document in Chart   Glucose, capillary     Status: Abnormal   Collection Time: 09/23/19  7:21 PM  Result Value Ref Range   Glucose-Capillary 107 (H) 70 - 99 mg/dL  Glucose, capillary     Status: Abnormal   Collection Time: 09/23/19 10:56 PM  Result Value Ref Range   Glucose-Capillary 114 (H) 70 - 99 mg/dL  Glucose, capillary     Status: Abnormal   Collection Time: 09/24/19  3:24 AM  Result Value Ref Range   Glucose-Capillary 119 (H) 70 - 99 mg/dL  CBC     Status: Abnormal   Collection Time: 09/24/19  5:57 AM  Result Value Ref Range   WBC 8.1 4.0 - 10.5 K/uL   RBC 3.40 (L) 4.22 - 5.81 MIL/uL   Hemoglobin 10.2 (L) 13.0 - 17.0 g/dL    HCT 30.8 (L) 39.0 - 52.0 %   MCV 90.6 80.0 - 100.0 fL   MCH 30.0 26.0 - 34.0 pg   MCHC 33.1 30.0 - 36.0 g/dL   RDW 13.9 11.5 - 15.5 %   Platelets 427 (H) 150 - 400 K/uL   nRBC 0.2 0.0 - 0.2 %    Comment: Performed at [name], [address] [name], [place] [address]  Comprehensive metabolic panel     Status: Abnormal   Collection Time: 09/24/19  5:57 AM  Result Value Ref Range   Sodium 146 (H) 135 - 145 mmol/L   Potassium 4.9 3.5 - 5.1 mmol/L   Chloride 111 98 - 111 mmol/L   CO2 23 22 - 32 mmol/L   Glucose, Bld 130 (H) 70 - 99 mg/dL   BUN 58 (H) 6 - 20 mg/dL   Creatinine, Ser 2.54 (H) 0.61 - 1.24 mg/dL   Calcium 9.7 8.9 - 10.3 mg/dL   Total Protein 7.5 6.5 - 8.1 g/dL   Albumin 2.8 (L) 3.5 - 5.0 g/dL   AST 27 15 - 41 U/L   ALT 29 0 - 44 U/L   Alkaline Phosphatase 54 38 - 126 U/L   Total Bilirubin 0.3 0.3 - 1.2 mg/dL   GFR calc non Af Amer 31 (L) >60 mL/min  GFR calc Af Amer 35 (L) >60 mL/min   Anion [name] 12 5 - 15    Comment: Performed at [name] [address] [name], [name] [address]  Phosphorus     Status: Abnormal   Collection Time: 09/24/19  5:57 AM  Result Value Ref Range   Phosphorus 5.8 (H) 2.5 - 4.6 mg/dL    Comment: Performed at [facility] [address], [name], [name] [address]  Glucose, capillary     Status: Abnormal   Collection Time: 09/24/19  8:21 AM  Result Value Ref Range   Glucose-Capillary 112 (H) 70 - 99 mg/dL   Comment 1 Notify RN    Comment 2 Document in Chart   Glucose, capillary     Status: Abnormal   Collection Time: 09/24/19 12:06 PM  Result Value Ref Range   Glucose-Capillary 135 (H) 70 - 99 mg/dL   Comment 1 Notify RN    Comment 2 Document in Chart   Glucose, capillary     Status: Abnormal   Collection Time: 09/24/19  3:18 PM  Result Value Ref Range   Glucose-Capillary 125 (H) 70 - 99 mg/dL   Comment 1 Notify RN    Comment 2 Document in Chart   Glucose, capillary     Status: Abnormal    Collection Time: 09/24/19  7:48 PM  Result Value Ref Range   Glucose-Capillary 112 (H) 70 - 99 mg/dL  Glucose, capillary     Status: Abnormal   Collection Time: 09/24/19 11:17 PM  Result Value Ref Range   Glucose-Capillary 119 (H) 70 - 99 mg/dL  Glucose, capillary     Status: Abnormal   Collection Time: 09/25/19  3:28 AM  Result Value Ref Range   Glucose-Capillary 115 (H) 70 - 99 mg/dL  Glucose, capillary     Status: Abnormal   Collection Time: 09/25/19  8:20 AM  Result Value Ref Range   Glucose-Capillary 129 (H) 70 - 99 mg/dL   Comment 1 Notify RN    Comment 2 Document in Chart   Glucose, capillary     Status: Abnormal   Collection Time: 09/25/19 12:31 PM  Result Value Ref Range   Glucose-Capillary 120 (H) 70 - 99 mg/dL   Comment 1 Notify RN    Comment 2 Document in Chart     No results found.  Review of Systems  Unable to perform ROS: Mental status change   Blood pressure 135/90, pulse (!) 120, temperature 98.8 F (37.1 C), temperature source Axillary, resp. rate 20, height 5\' 9"  (1.753 m), weight 59.3 kg, SpO2 96 %. Physical Exam  Constitutional: He appears well-developed and well-nourished.  HENT:  CorTrak NGT Left skull flap out.  Neck: Neck supple. No tracheal deviation present. No thyromegaly present.  Cardiovascular: Normal rate and regular rhythm.  Respiratory: Effort normal. No respiratory distress. He has no wheezes.  GI: Soft. Bowel sounds are normal. He exhibits no distension. There is no abdominal tenderness. There is no rebound and no guarding.  Skull flap right abdomen  Neurological:  Eyes open, does not follow commands, did not respond verbally for me  Skin: Skin is warm.    Assessment/Plan: Status post large left intracranial hemorrhage with decompressive craniectomy for evacuation of basal ganglia hematoma.  Needs central access.  This will need to be in the endoscopy unit under anesthesia sedation.  We will schedule.  I spoke with his family member  at the bedside.  His mother makes decisions  and according to Dr. Leonie [name] is reachable by phone.  We will contact.  [name] 09/25/2019, 3:13 PM

## 2019-09-25 NOTE — Plan of Care (Signed)
  Problem: Pain Managment: Goal: General experience of comfort will improve Outcome: Progressing   

## 2019-09-25 NOTE — Progress Notes (Signed)
Subjective: Patient reports no change  Objective: Vital signs in last 24 hours: Temp:  [97.7 F (36.5 C)-99.2 F (37.3 C)] 98.3 F (36.8 C) (09/28 0400) Pulse Rate:  [68-100] 77 (09/28 0800) Resp:  [10-21] 12 (09/28 0800) BP: (107-152)/(75-123) 119/95 (09/28 0800) SpO2:  [95 %-100 %] 100 % (09/28 0800) Weight:  [59.3 kg] 59.3 kg (09/28 0500)  Intake/Output from previous day: 09/27 0701 - 09/28 0700 In: 1788 [I.V.:288; NG/GT:1500] Out: 1100 [Urine:1100] Intake/Output this shift: Total I/O In: 24 [I.V.:24] Out: -   Physical Exam: Awake, alert, aphasic.  Purposeful left side, spastic right arm.  Minimal movement right side.  Cranial flap still full.  Abdominal wound healed well.  Lab Results: Recent Labs    09/23/19 0706 09/24/19 0557  WBC 9.9 8.1  HGB 9.3* 10.2*  HCT 29.3* 30.8*  PLT 434* 427*   BMET Recent Labs    09/23/19 0706 09/24/19 0557  NA 144 146*  K 4.1 4.9  CL 111 111  CO2 23 23  GLUCOSE 121* 130*  BUN 51* 58*  CREATININE 2.27* 2.54*  CALCIUM 9.2 9.7    Studies/Results: Dg Chest Port 1 View  Result Date: 09/23/2019 CLINICAL DATA:  Fever EXAM: PORTABLE CHEST 1 VIEW COMPARISON:  09/18/2019 FINDINGS: There has been interval endotracheal extubation. There is no acute appearing airspace opacity. Cardiomegaly. Partially imaged enteric feeding tube. IMPRESSION: 1. There has been interval endotracheal extubation. There is no acute appearing airspace opacity. 2.  Cardiomegaly. Electronically Signed   By: Eddie Candle M.D.   On: 09/23/2019 13:18    Assessment/Plan: Patient is stable.  OK to remove staples today.  Working on weaning precedex.  No new recs.    LOS: 16 days    Peggyann Shoals, MD 09/25/2019, 8:34 AM

## 2019-09-25 NOTE — Progress Notes (Signed)
Occupational Therapy Treatment Patient Details Name: [name] MRN: [id number] DOB: Apr 30, 1981 Today's Date: 09/25/2019    History of present illness This pt is a 38 year old with history of alcohol abuse, seizures, noncompliant hypertension who presented with new seizure right-sided hemiplegia and confusion was found to have a large left intracranial hemorrhage now s/p left frontoparietal craniectomy for evacuation of left basal ganglia hematoma on 09/09/19. Skull flap in abdomen.  Intubated 9/12 (day of admission) and extubated 09/15/19.    OT comments  Pt continues to present with poor coordination, balance, cognition, functional use of RUE, and activity tolerance. Pt requiring Max A +2 for bed mobility and then Total A to maintain sitting at EOB. Pt requiring Max hand over hand to perform grooming at EOB. Continue to recommend dc to CIR and will continue to follow acutely.    Follow Up Recommendations  CIR;Supervision/Assistance - 24 hour    Equipment Recommendations  Other (comment)(Defer to next venue)    Recommendations for Other Services Rehab consult    Precautions / Restrictions Precautions Precautions: Fall Precaution Comments: L crani, bone flap removed and in abdomen, NG, L wrist restraint and mitt Restrictions Weight Bearing Restrictions: No       Mobility Bed Mobility Overal bed mobility: Needs Assistance Bed Mobility: Supine to Sit;Sit to Supine     Supine to sit: Max assist;+2 for physical assistance Sit to supine: Max assist;+2 for physical assistance   General bed mobility comments: assist for trunk and LE management, impaired sequencing and decreased strength  Transfers                 General transfer comment: deferred    Balance Overall balance assessment: Needs assistance Sitting-balance support: Feet supported Sitting balance-Leahy Scale: Zero Sitting balance - Comments: total assist for sitting balance, pt with strong R lateral lean,  maxA to maintain head in extension                                    ADL either performed or assessed with clinical judgement   ADL Overall ADL's : Needs assistance/impaired     Grooming: Wash/dry face;Maximal assistance;Sitting Grooming Details (indicate cue type and reason): Max A for hand over hand to wash face. Pt pushign back in responce. Total A for sitting balance                               General ADL Comments: Max-Total A for ADLs and bed mobility     Vision   Vision Assessment?: Vision impaired- to be further tested in functional context;Yes Additional Comments: Not making eye contact and poor visual tracking   Perception     Praxis      Cognition Arousal/Alertness: Awake/alert Behavior During Therapy: Restless;Impulsive Overall Cognitive Status: Difficult to assess                                 General Comments: pt with minimal command follow x 4 extremeties but moves L UE purposefully, no verbal communication, did initiate transfer to EOB, continuously trying to reach for NG tube with L UE        Exercises Exercises: Other exercises Other Exercises Other Exercises: PROM to bilat LE, pt very rigid/tight/resistant, espeically with knee extension in sitting, pt holding legs in adduction  Shoulder Instructions       General Comments HR increased from 90s to 120s while sitting EOB    Pertinent Vitals/ Pain       Pain Assessment: Faces Faces Pain Scale: No hurt Pain Intervention(s): Monitored during session;Limited activity within patient's tolerance;Repositioned  Home Living                                          Prior Functioning/Environment              Frequency  Min 2X/week        Progress Toward Goals  OT Goals(current goals can now be found in the care plan section)  Progress towards OT goals: Progressing toward goals  Acute Rehab OT Goals Patient Stated Goal: none  stated OT Goal Formulation: Patient unable to participate in goal setting Time For Goal Achievement: 10/06/19 Potential to Achieve Goals: Good ADL Goals Pt Will Perform Grooming: with min assist;sitting Pt/caregiver will Perform Home Exercise Program: Increased ROM;Increased strength;Right Upper extremity;With minimal assist Additional ADL Goal #1: Pt will maintain static balance sitting EOB with modA to increase independence with ADL task. Additional ADL Goal #2: Pt will track towards R visual [facility] with no more than mod cues. Additional ADL Goal #3: Pt will follow one step commands with 50% accuracy during functional task.  Plan Discharge plan remains appropriate    Co-evaluation    PT/OT/SLP Co-Evaluation/Treatment: Yes Reason for Co-Treatment: Complexity of the patient's impairments (multi-system involvement);For patient/therapist safety;To address functional/ADL transfers PT goals addressed during session: Mobility/safety with mobility OT goals addressed during session: ADL's and self-care      AM-PAC OT "6 Clicks" Daily Activity     Outcome Measure   Help from another person eating meals?: Total Help from another person taking care of personal grooming?: Total Help from another person toileting, which includes using toliet, bedpan, or urinal?: Total Help from another person bathing (including washing, rinsing, drying)?: Total Help from another person to put on and taking off regular upper body clothing?: Total Help from another person to put on and taking off regular lower body clothing?: Total 6 Click Score: 6    End of Session    OT Visit Diagnosis: Other abnormalities of gait and mobility (R26.89);Hemiplegia and hemiparesis;Cognitive communication deficit (R41.841) Symptoms and signs involving cognitive functions: Other Nontraumatic ICH Hemiplegia - Right/Left: Right Hemiplegia - caused by: Other Nontraumatic intracranial hemorrhage   Activity Tolerance Patient  tolerated treatment well   Patient Left in bed;with call bell/phone within reach;with bed alarm set;with restraints reapplied   Nurse Communication Mobility status        Time: 1350-1413 OT Time Calculation (min): 23 min  Charges: OT General Charges $OT Visit: 1 Visit OT Treatments $Self Care/Home Management : 8-22 mins  [place], OTR/L Acute Rehab Pager: [phone] Office: [place] 09/25/2019, 4:36 PM

## 2019-09-25 NOTE — Progress Notes (Signed)
STROKE TEAM PROGRESS NOTE   INTERVAL HISTORY Patient remains neurologically unchanged.  Is awake alert but aphasic not following commands and remains plegic on the right and has purposeful movements on the left side.  Blood pressure adequately controlled.  Vital signs are stable.  Patient's girlfriend is at the bedside.  OBJECTIVE Vitals:   09/25/19 1000 09/25/19 1100 09/25/19 1200 09/25/19 1300  BP: (!) 136/101 112/89 (!) 138/101 133/82  Pulse: 86 88 (!) 116 97  Resp: 15 13 18 20   Temp:   98.8 F (37.1 C)   TempSrc:   Axillary   SpO2: 96% 96% 96% 97%  Weight:      Height:        CBC:  Recent Labs  Lab 09/20/19 0536 09/21/19 0450  09/23/19 0706 09/24/19 0557  WBC 16.3* 11.3*   < > 9.9 8.1  NEUTROABS 12.4* 9.0*  --   --   --   HGB 8.5* 7.8*   < > 9.3* 10.2*  HCT 28.2* 23.6*   < > 29.3* 30.8*  MCV 97.9 91.1   < > 91.8 90.6  PLT 361 316   < > 434* 427*   < > = values in this interval not displayed.    Basic Metabolic Panel:  Recent Labs  Lab 09/22/19 0543 09/23/19 0706 09/24/19 0557  NA 148* 144 146*  K 4.6 4.1 4.9  CL 112* 111 111  CO2 26 23 23   GLUCOSE 144* 121* 130*  BUN 52* 51* 58*  CREATININE 2.36* 2.27* 2.54*  CALCIUM 9.2 9.2 9.7  MG 2.5* 2.3  --   PHOS 6.4* 5.4* 5.8*    IMAGING Ct Head Code Stroke Wo Contrast 09/09/2019 1. 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  2. Intraventricular extension of hemorrhage. Small focus of intraparenchymal hemorrhage in the right basal ganglia.   CT head  CTA Head  09/10/2019 1. Status post left pterional craniectomy for evacuation of left basal ganglia hematoma. Residual hematoma with interspersed postoperative gas measures 6.1 x 2.9 cm. 2. Decreased midline shift, now measuring 6 mm to the right. 3. Unchanged punctate focus of hemorrhage at the posterior right insula. 4. Unchanged intraventricular  blood. 5. No intracranial arterial occlusion or high-grade stenosis. No aneurysm or vascular malformation.   CT head  09/13/2019 Stable changes post left frontoparietal craniectomy for evacuation of left-sided central hemorrhagic stroke. Residual without significant change measuring 2.8 x 6.5 cm with less air within the hemorrhage. Stable postsurgical change with mild edema/mass effect and stable midline shift to the right of 5-6 mm. Decrease in density 4 mm focus of hemorrhage over the right basal ganglia.  CT Head W/O Contrast 09/17/19 1. Unchanged size of intraparenchymal hematoma centered in the left basal ganglia with unchanged surrounding edema and mass effect. 2. Unchanged rightward midline shift, measuring 6 mm.  Dg Chest Port 1 View 09/23/19 There has been interval endotracheal extubation. There is no acute appearing airspace opacity. Cardiomegaly. 09/18/2019 1. Endotracheal tube, feeding tube in stable position. PICC line tip noted over the right atrium. 2.  Stable cardiomegaly. 3.  Low lung volumes with right base atelectasis/infiltrate. 09/17/2019 1. Lungs now clear. 2. No new abnormalities. 3. Stable support apparatus. 09/16/2019 1. No acute cardiopulmonary disease. Right lung base atelectasis has improved. 2. Support apparatus is stable and well positioned. 09/15/2019 1.  Lines and tubes in good anatomic position. 2.  Progressive right  lower lobe atelectasis/infiltrate. 09/10/2019 Resolving right midlung atelectasis. Tubes and lines as described. 09/09/2019 No acute cardiopulmonary abnormality. Appropriate positioning of the endotracheal and transesophageal tubes.   Renal US 09/13/2019 1. Mildly echogenic kidneys likely related to underlying medical renal disease. 2. Mild bilateral hydronephrosis.  No shadowing stone.  ECG - SR rate 97 BPM. LVH and prolonged QT interval. (See cardiology reading for complete details) Transthoracic Echocardiogram  09/16/2019 1. Left ventricular  ejection fraction, by visual estimation, is 70 to 75%. The left ventricle has hyperdynamic function. Normal left ventricular size. There is severely increased left ventricular hypertrophy.  2. Global right ventricle has normal systolic function.The right ventricular size is normal. No increase in right ventricular wall thickness.  3. Left atrial size was normal.  4. Right atrial size was moderately dilated.  5. The mitral valve is grossly normal. Mild mitral valve regurgitation.  6. The tricuspid valve is grossly normal. Tricuspid valve regurgitation is trivial.  7. The aortic valve is tricuspid Aortic valve regurgitation was not visualized by color flow Doppler.  8. The pulmonic valve was grossly normal. Pulmonic valve regurgitation is not visualized by color flow Doppler.  9. The inferior vena cava was not able to be adequately assessed to estimate CVP.   PHYSICAL EXAM    General - Well nourished, well developed, off precedex, lethargic but less agitated than prior notes stated.   Ophthalmologic - fundi not visualized due to noncooperation.  Cardiovascular - Regular rate and rhythm.  Neurological Exam :  -awake eyes open, globally aphasic.  Nonverbal, not following commands. Eyes left gaze preference, blinking to visual threat on the left, not tracking, pupils reactive to light. Right facial droop.  Tongue protrusion not cooperative. Spontaneous movement on the left against gravity. On pain stimulation, RUE 0/5 and RLE slight withdraw on pain. Significantly increased muscle tone on right UE. DTR 3+ on the right and toes upgoing on the right. Sensation, coordination not cooperative and gait not tested.    ASSESSMENT/PLAN   [name] is a 38 y.o. male with history of admission for Etoh poisoning presenting with a seizure, vomiting.   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 In addition,  small focus of R basal ganglia ICH.   NSG - decompressive craniectomy and hematoma evacuation w/ flap in R abd - 09/09/19 Dr Vertell Limber  Code Stroke CT Head - Large intraparenchymal hematoma of the left basal ganglia measuring 7.8 x 4.8 cm with 14 mm of rightward midline shift and early subfalcine herniation. Intraventricular extension of hemorrhage. Small focus of intraparenchymal hemorrhage in the right basal ganglia.   CT/CTA head - 09/10/2019 - Status post left pterional craniectomy for evacuation of left basal ganglia hematoma. Residual hematoma with interspersed postoperative gas measures 6.1 x 2.9 cm. Decreased midline shift, now measuring 6 mm to the right. No intracranial arterial occlusion or high-grade stenosis. No aneurysm or vascular malformation  Ct head 9/16 and 9/20 stable crani. Stable 5-6 midline shift. Decreased R basal ganglia hemorrhage   HgbA1c 5.6  LDL 86  TTE 09/16/19 EF 70 to 75%. There is severely increased left ventricular hypertrophy.  [employer] Virus 2 - negative  UDS - negatived  VTE prophylaxis - Heparin 5000 units sq tid   No antithrombotic prior to admission, now on No antithrombotic d/t hemorrhage   Therapy recommendations: CIR recommended. They are following.  Disposition:  Pending  Cerebral  Edema  S/p crani and hematoma evacuation  Treated with 3% saline protocol, now off  Na - 146  Now Free water 200 q4h   CT repeat 09/17/19 - Unchanged size of intraparenchymal hematoma and surrounding edema and mass effect.   PICC line out 9/25, for midline  At 10 days out of onset, will allow gradually normalization of Na   d/c staples day #14 (9/26)   Respiratory Failure  Secondary to [place]  Off sedation and extubated 9/18  Re-intubated 9/18 due to low O2 sat and not protect airway  Extubated 9/23, tolerating well  Still on precedex, on seroquel 50 bid  CCM on board  VAP vs Aspiration PNA (vomited PTA) Leukocytosis, Febrile  WBC - 8.1  T  Max - 99.3  U/A neg 9/14  Blood cultures normal flora  CXR progressive RLL atx/infilatrate -> CXR - 09/16/19 neg  D/c Vanc, d/c Zosyn -> Unasyn 9/17-9/20 (Now off all antibiotics)  Hypertensive Emergency  Probable undiagnosed / untreated hypertension (severe LVH on ECG)  Home BP meds: none  Current BP meds: amlodipine 10, hydralazine 50 tid, labetolol 200 tid    Off Lisinopril D/t increasing Cr  SBP goal < 160 mm Hg     Off cleviprex   BP stable   Seizure  On presentation  Hx of one seizure previously  On Keppra 500 mg Bid  AKI on CKD stage III  ACE stopped 9/14.   Renal US mild echogenic kidneys c/w CKD  Creatinine - 2.54  Continue TF and free water - consider increasing   Alcohol abuse  Hx ETOH abuse  recent hospitalization for alcohol poisoning.   Drinks 1 pt [place] per day.   On FA/MVI/B1  On CIWA protocol.  Hyperlipidemia  Home Lipid lowering medication: none  LDL - 86  Current lipid lowering medication: None  May consider statin at discharge  Dysphagia Malnutrition . Secondary to stroke . NPO  Has cortrak and tube feeding @ 31  Speech consult re-ordered  Anemia of critical illness  Hgb 14.5->...10.2  Unclear etiology  On PPI BID  Close CBC monitoring  Other Stroke Risk Factors  Polysubstance abuse, UDS - negative 09/09/19  Other Active Problems  Hypokalemia - resolved   Mild Thrombocytopenia - resolved -  Hypocalcemia - resolved   Phosphorus - 6.4->5.4->5.8  U/A and culture -> U/A - negative ; Culture > 100k Klebsiella oxytoca. Will add ancef. Discussed with pharmacy who will add correct dose    Cardiomegaly.   Hospital day # 16  Patient remains globally aphasic with dense hemiplegia.  He is off Precedex since this morning.  He is unlikely to be able to swallow safely and will need PEG tube.  Discussed with girlfriend at bedside.  We will consult trauma team for PEG.  I called and left a message for  patient's mother to give her an update. Discussed with Dr. Grandville Silos trauma team. This patient is critically ill and at significant risk of neurological worsening, death and care requires constant monitoring of vital signs, hemodynamics,respiratory and cardiac monitoring, extensive review of multiple databases, frequent neurological assessment, discussion with family, other specialists and medical decision making of high complexity.I have made any additions or clarifications directly to the above note.This critical care time does not reflect procedure time, or teaching time or supervisory time of PA/NP/Med Resident etc but could involve care discussion time.  I spent 30 minutes of neurocritical care time  in the care of  this patient.  Antony Contras, MD To contact Stroke Continuity provider, please refer to [email or web address] After hours, contact General Neurology

## 2019-09-25 NOTE — Progress Notes (Signed)
PULMONARY / CRITICAL CARE MEDICINE   NAME:  Joshua [name], MRN:  [id number], DOB:  07/16/1981, LOS: 7 ADMISSION DATE:  09/09/2019, CONSULTATION DATE: 09/09/2019 REFERRING MD: Neurosurgery, CHIEF COMPLAINT: Vent dependent respiratory failure secondary to intracranial hemorrhage  BRIEF HISTORY:    38 year old history of alcohol abuse seizures noncompliant hypertension who presented with new seizure right-sided hemiplegia confusion was found to have a large left intracranial hemorrhage for which he underwent craniectomy.  SIGNIFICANT PAST MEDICAL HISTORY   Hypertension Alcohol abuse  SIGNIFICANT EVENTS:  9/12 Admitted/  operating room for left frontal temporal craniectomy and evacuation hematoma 9/14 off hypertonic saline, fevers/ started on abx  9/15 ? Withdrawals?, started on precedex, cleviprex off overnight, weaned 8 hrs on SBT 5/5  9/16 decreasing Hgb, continues to wean well- mental status remains barrier 9/18 extubated > reintubated due to protect airway and low oxygen 9/23 > Extubated  9/25 precedex off but restarted 2/2 agitation  STUDIES:   CT of head 09/09/2019 >1. 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 2. Intraventricular extension of hemorrhage. Small focus of intraparenchymal hemorrhage in the right basal ganglia. CT of head 9/13 > decreased size of left basal ganglia hematoma with expected postoperative air.  Mild improvement of associated mass-effect.  Redistribution of intraventricular blood. [place] 9/16 > 1.  Stable changes post left frontoparietal craniectomy for evacuation of left-sided central hemorrhagic stroke. Residual without significant change measuring 2.8 x 6.5 cm with less air within the hemorrhage. Stable postsurgical change with mild edema/mass effect and stable midline shift to the right of 5-6 mm. 2.  Decrease in density 4 mm focus of hemorrhage over the  right basal ganglia. Renal US 9/16 > 1. Mildly echogenic kidneys likely related to underlying medical renal disease. 2. Mild bilateral hydronephrosis.  No shadowing stone.  CULTURES:  09/09/2019 sputum>normal flora 9/12 MRSA PCR > neg 9/12 COVID > neg 9/14 sputum> normal flora 9/26 UC > >100k GNR  ANTIBIOTICS:  Vanc 9/14> 9/16 Pip-tazo 9/14>9/17 Unasyn 9/17 >9/21  LINES/TUBES:  ETT > 9/12- 9/18 re-intubated 9/18-9/23 Right radial A-line 9/12 > 9/15 L cortrak 9/14 >> Right Brachial PICC 9/14 >>   CONSULTANTS:  09/09/2019 pulmonary critical care 09/09/2019 neurology 09/09/2019 NSGY  SUBJECTIVE:  Remains on precedex 0.8 for intermittent agitation Afebrile No issues per nursing No neurological changes  CONSTITUTIONAL: BP (!) 140/94   Pulse 85   Temp 98.3 F (36.8 C) (Axillary)   Resp 20   Ht 5\' 9"  (1.753 m)   Wt 59.3 kg   SpO2 98%   BMI 19.31 kg/m   I/O last 3 completed shifts: In: 2602.6 [I.V.:442.6; NG/GT:2160] Out: 2500 [Urine:2500]    PHYSICAL EXAM: General:  Adult male lying in bed in NAD HEENT: MM pink/moist, pupils 3/sluggish, R nare cortrak, left crani site with staples approximated Neuro: opens eyes to verbal, not f/c, purposeful on left, aphasic, right hemiplegia CV: rr, no murmur PULM:  CTA, room air, non-labored GI: soft, bs active, bone flap right abd dressing cdi, condom cath Extremities: warm/dry, no LE edema, increasing contracture to RUE  Skin: no rashes, skin tear to penis with foam dressing  RESOLVED PROBLEM LIST  Thrombocytopenia Acute hypoxic respiratory failure Aspiration PNA Mild hyperchloremic acidosis  ASSESSMENT AND PLAN    At risk for respiratory insufficiency  - extubated 9/23 Plan Remains on room air Ongoing aggressive pulmonary hygiene/ PT CXR prn   Lt BG ICH  with midline shift and subfalcine herniation complicated by cerebral edema and seizures s/p Craniectomy with evacuation  Plan  Neurology primary, NSGY  following Pending staple removal per NSGY Ongoing Keppra BID PT/ OT following, monitoring RUE for contracture  Hypertensive Emergency  Plan  Tele monitoring  Ongoing SBP goal < 160 Continue norvasc, hydralazine, labetalol  Apresoline prn   Acute on Chronic Kidney Disease Stage 3 Hypernatremia  Plan sCr remains stable, trending BMP/ mag/ phos Continue condom cath, monitor for urinary retention Continue free water 200 ml q 4 hr Trend urinary output/ daily weights Replace electrolytes as indicated  Polysubstance/ ETOH abuse Ongoing agitation  Plan  Ongoing daily thiamine, folic acid, MVI Clonidine 0.1mg  TID started 9/26 in attempts to wean ongoing precedex for RASS goal 0, will check with pharmacy to align with clonidine taper for precedex seroquel started 9/25  Dysphagia  Plan Continue TF at goal via cortrak, will need PEG at some point  Hyperglycemia Plan CBG q 4, SSI moderate  Anemia of critical illness - s/p 1U PRBC 9/17 Plan Trending CBC, H/H stabe  Best Practice / Goals of Care / Disposition.    DVT PROPHYLAXIS: SCDs, [place] heparin  GI prophylaxis: PPI BID per tube Pain agitation: RASS goal 0/-1, precedex-->clondine, seroquel, other NUTRITION: NPO/ tube feedings at goal  MOBILITY: Bedrest GOALS OF CARE: Full code Foley catheter: condom catheter FAMILY DISCUSSIONS: pending  DISPOSITION PCCM will sign off when able to wean precedex off, and likely transfer to Neuro Floor   I spent 30 minutes in direct patient care including reviewing data,  discussing with other providers, assessment, planning and stabilization and documentation. Time is exclusive to this patient and does not include procedures.    Kennieth Rad, MSN, AGACNP-BC [facility] Pgr: [phone] or if no answer [phone] 09/25/2019, 7:25 AM

## 2019-09-26 LAB — BASIC METABOLIC PANEL
Anion gap: 11 (ref 5–15)
BUN: 63 mg/dL — ABNORMAL HIGH (ref 6–20)
CO2: 24 mmol/L (ref 22–32)
Calcium: 9.6 mg/dL (ref 8.9–10.3)
Chloride: 109 mmol/L (ref 98–111)
Creatinine, Ser: 2.39 mg/dL — ABNORMAL HIGH (ref 0.61–1.24)
GFR calc Af Amer: 38 mL/min — ABNORMAL LOW (ref >60)
GFR calc non Af Amer: 33 mL/min — ABNORMAL LOW (ref >60)
Glucose, Bld: 121 mg/dL — ABNORMAL HIGH (ref 70–99)
Potassium: 4.8 mmol/L (ref 3.5–5.1)
Sodium: 144 mmol/L (ref 135–145)

## 2019-09-26 LAB — GLUCOSE, CAPILLARY
Glucose-Capillary: 124 mg/dL — ABNORMAL HIGH (ref 70–99)
Glucose-Capillary: 117 mg/dL — ABNORMAL HIGH (ref 70–99)
Glucose-Capillary: 136 mg/dL — ABNORMAL HIGH (ref 70–99)
Glucose-Capillary: 128 mg/dL — ABNORMAL HIGH (ref 70–99)
Glucose-Capillary: 134 mg/dL — ABNORMAL HIGH (ref 70–99)
Glucose-Capillary: 167 mg/dL — ABNORMAL HIGH (ref 70–99)

## 2019-09-26 MED ORDER — OSMOLITE 1.5 CAL PO LIQD
1000.0000 mL | ORAL | Status: DC
  Administered 2019-10-01 – 2019-10-10 (×9): 1000 mL
  Filled 2019-09-26 (×24): qty 1000

## 2019-09-26 NOTE — Progress Notes (Signed)
Nutrition Follow-up  DOCUMENTATION CODES:   Not applicable  INTERVENTION:   Increase Osmolite 1.5 to 65 ml/hr via Cortrak D/C Prostat  Provides: 2340 kcal, 97 grams protein, and 1188 ml free water. Total free water: 2388 ml   NUTRITION DIAGNOSIS:   Inadequate oral intake related to inability to eat as evidenced by NPO status. Ongoing.   GOAL:   Patient will meet greater than or equal to 90% of their needs Progressing.   MONITOR:   TF tolerance, Weight trends  REASON FOR ASSESSMENT:   Ventilator, Consult Enteral/tube feeding initiation and management  ASSESSMENT:   38 yo male admitted with large left intracranial hemorrhage; S/P craniotomy. PMH includes HTN, alcoholism, noncompliance.   Pt discussed during ICU rounds and with RN.  Plan for trach 10/1.   9/14 cortrak placed, tip in stomach 9/23 pt extubated; TF resumed via cortrak   Labs reviewed. Na 146 (H)  Medications reviewed and include folic acid, SSI, MVI, thiamine 200 ml free water every 4 hours = 1200 ml   Weight down to 59.3 kg from admission weight of 63 kg.   TF: Osmolite 1.5 @ 55 ml/hr via Cortrak 30 ml Prostat BID Provides: 2180 kcal, 112 grams protein  Diet Order:   Diet Order            Diet NPO time specified  Diet effective midnight              EDUCATION NEEDS:   Not appropriate for education at this time  Skin:  Skin Assessment: Reviewed RN Assessment(incisions to head and abdomen)  Last BM:  9/26  Height:   Ht Readings from Last 1 Encounters:  09/09/19 5\' 9"  (1.753 m)    Weight:   Wt Readings from Last 1 Encounters:  09/25/19 59.3 kg    Ideal Body Weight:  72.7 kg  BMI:  Body mass index is 19.31 kg/m.  Estimated Nutritional Needs:   Kcal:  2000-2200  Protein:  95-105 gm  Fluid:  >/= 1.9 L  Maylon Peppers RD, LDN, CNSC [phone] Pager [phone] After Hours Pager

## 2019-09-26 NOTE — Progress Notes (Addendum)
Inpatient Rehabilitation Admissions Coordinator  Pt not yet at a level to tolerate the intensity of an inpt rehab admit. I am following his progress. I met with his girlfriend at bedside. They have recently reunited the week of Labor day. She states pt's Mom and sisters are  in [facility] and that pt likely to return to [facility] for his caregiver support. I spoke with pt's Mom, [name] by phone. She lives in [place], [place]. I have asked her to begin discussions with her children and family of how they could provide caregiver support that he will need after any rehab venue. I will call her again in a few days.  Danne Baxter, RN, MSN Rehab Admissions Coordinator [phone] 09/26/2019 11:03 AM

## 2019-09-26 NOTE — Progress Notes (Signed)
Occupational Therapy Treatment Patient Details Name: Joshua [name] MRN: [id number] DOB: 09-14-1981 Today's Date: 09/26/2019    History of present illness This pt is a 38 year old with history of alcohol abuse, seizures, noncompliant hypertension who presented with new seizure right-sided hemiplegia and confusion was found to have a large left intracranial hemorrhage now s/p left frontoparietal craniectomy for evacuation of left basal ganglia hematoma on 09/09/19. Skull flap in abdomen.  Intubated 9/12 (day of admission) and extubated 09/15/19.    OT comments  Upon arrival, pt awake and supine in bed with right splint on and his girlfriend at bedside. Focused session on estabilishing splint wear and schedule. Doffing resting hand splint and performing skin check, no noted redness or edema. Facilitating PROM for RUE hand, wrist, and elbow. Donning elbow extension splint and checking fit and position. Educated Therapist, sports and girlfriend on splint management and wear schedule. Left resting hand splint off as PT entering for session and reports she will don after session. Will continue to follow acutely as admitted. Continue to recommend dc to CIR.   Follow Up Recommendations  CIR;Supervision/Assistance - 24 hour    Equipment Recommendations  Other (comment)(Defer to next venue)    Recommendations for Other Services Rehab consult    Precautions / Restrictions Precautions Precautions: Fall Precaution Comments: L crani, bone flap removed and in abdomen, NG, L wrist restraint and mitt       Mobility Bed Mobility                  Transfers                 General transfer comment: deferred    Balance                                           ADL either performed or assessed with clinical judgement   ADL Overall ADL's : Needs assistance/impaired                                       General ADL Comments: Focuses session on splint check and RUE  ROM     Vision       Perception     Praxis      Cognition Arousal/Alertness: Awake/alert Behavior During Therapy: Restless;Impulsive Overall Cognitive Status: Difficult to assess                                 General Comments: Pt without verbal communication. impulsive movements and attempting to grap NG tube        Exercises Exercises: General Upper Extremity General Exercises - Upper Extremity Elbow Extension: PROM;Right;5 reps;Supine Wrist Flexion: PROM;Right;5 reps;Supine Wrist Extension: PROM;Right;5 reps;Supine Digit Composite Flexion: PROM;Right;5 reps;Supine Composite Extension: PROM;Right;5 reps;Supine   Shoulder Instructions       General Comments Pt's girlfriend present throughout. Providing education on splint management and wear to girlfriend. Notified RN and educated on splint    Pertinent Vitals/ Pain       Pain Assessment: Faces Faces Pain Scale: No hurt Pain Location: generalized discomfort, especially with NG reinsertion Pain Descriptors / Indicators: Discomfort;Grimacing;Guarding Pain Intervention(s): Monitored during session;Limited activity within patient's tolerance;Repositioned  Home Living  Prior Functioning/Environment              Frequency  Min 2X/week        Progress Toward Goals  OT Goals(current goals can now be found in the care plan section)  Progress towards OT goals: Progressing toward goals  Acute Rehab OT Goals Patient Stated Goal: none stated OT Goal Formulation: Patient unable to participate in goal setting Time For Goal Achievement: 10/06/19 Potential to Achieve Goals: Good ADL Goals Pt Will Perform Grooming: with min assist;sitting Pt/caregiver will Perform Home Exercise Program: Increased ROM;Increased strength;Right Upper extremity;With minimal assist Additional ADL Goal #1: Pt will maintain static balance sitting EOB with modA to  increase independence with ADL task. Additional ADL Goal #2: Pt will track towards R visual field with no more than mod cues. Additional ADL Goal #3: Pt will follow one step commands with 50% accuracy during functional task.  Plan Discharge plan remains appropriate    Co-evaluation                 AM-PAC OT "6 Clicks" Daily Activity     Outcome Measure   Help from another person eating meals?: Total Help from another person taking care of personal grooming?: Total Help from another person toileting, which includes using toliet, bedpan, or urinal?: Total Help from another person bathing (including washing, rinsing, drying)?: Total Help from another person to put on and taking off regular upper body clothing?: Total Help from another person to put on and taking off regular lower body clothing?: Total 6 Click Score: 6    End of Session Equipment Utilized During Treatment: Other (comment)(Resting hand splint and elbow splint)  OT Visit Diagnosis: Other abnormalities of gait and mobility (R26.89);Hemiplegia and hemiparesis;Cognitive communication deficit (R41.841) Symptoms and signs involving cognitive functions: Other Nontraumatic ICH Hemiplegia - Right/Left: Right Hemiplegia - caused by: Other Nontraumatic intracranial hemorrhage   Activity Tolerance Patient tolerated treatment well   Patient Left in bed;with call bell/phone within reach;with bed alarm set;with restraints reapplied   Nurse Communication Mobility status        Time: 1151-1159 OT Time Calculation (min): 8 min  Charges: OT General Charges $OT Visit: 1 Visit OT Treatments $Orthotics/Prosthetics Check: 8-22 mins  Lares, OTR/L Acute Rehab Pager: [phone] Office: [place] 09/26/2019, 12:23 PM

## 2019-09-26 NOTE — Progress Notes (Signed)
PULMONARY / CRITICAL CARE MEDICINE   NAME:  Joshua [name], MRN:  [id number], DOB:  February 05, 1981, LOS: 77 ADMISSION DATE:  09/09/2019, CONSULTATION DATE: 09/09/2019 REFERRING MD: Neurosurgery, CHIEF COMPLAINT: Vent dependent respiratory failure secondary to intracranial hemorrhage  BRIEF HISTORY:    38 year old history of alcohol abuse seizures noncompliant hypertension who presented with new seizure right-sided hemiplegia confusion was found to have a large left intracranial hemorrhage for which he underwent craniectomy.  SIGNIFICANT PAST MEDICAL HISTORY   Hypertension Alcohol abuse  SIGNIFICANT EVENTS:  9/12 Admitted/  operating room for left frontal temporal craniectomy and evacuation hematoma 9/14 off hypertonic saline, fevers/ started on abx  9/15 ? Withdrawals?, started on precedex, cleviprex off overnight, weaned 8 hrs on SBT 5/5  9/16 decreasing Hgb, continues to wean well- mental status remains barrier 9/18 extubated > reintubated due to protect airway and low oxygen 9/23 > Extubated  9/25 precedex off but restarted 2/2 agitation 9/28 off precedex, CCS consulted for Gtube  STUDIES:   CT of head 09/09/2019 >1. 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 2. Intraventricular extension of hemorrhage. Small focus of intraparenchymal hemorrhage in the right basal ganglia. CT of head 9/13 > decreased size of left basal ganglia hematoma with expected postoperative air.  Mild improvement of associated mass-effect.  Redistribution of intraventricular blood. [place] 9/16 > 1.  Stable changes post left frontoparietal craniectomy for evacuation of left-sided central hemorrhagic stroke. Residual without significant change measuring 2.8 x 6.5 cm with less air within the hemorrhage. Stable postsurgical change with mild edema/mass effect and stable midline shift to the right of 5-6 mm. 2.  Decrease in  density 4 mm focus of hemorrhage over the right basal ganglia. Renal US 9/16 > 1. Mildly echogenic kidneys likely related to underlying medical renal disease. 2. Mild bilateral hydronephrosis.  No shadowing stone.  CULTURES:  09/09/2019 sputum>normal flora 9/12 MRSA PCR > neg 9/12 COVID > neg 9/14 sputum> normal flora 9/26 UC > >100k GNR  ANTIBIOTICS:  Vanc 9/14> 9/16 Pip-tazo 9/14>9/17 Unasyn 9/17 >9/21  LINES/TUBES:  ETT > 9/12- 9/18 re-intubated 9/18-9/23 Right radial A-line 9/12 > 9/15 L cortrak 9/14 >> Right Brachial PICC 9/14 >> 9/19  CONSULTANTS:  09/09/2019 pulmonary critical care 09/09/2019 neurology 09/09/2019 NSGY  SUBJECTIVE:  Off precedex since 9/28 afternoon with no issues or agitation Remains neurologically unchanged and hemodynamically stable, afebrile  CONSTITUTIONAL: BP (!) 131/95   Pulse 90   Temp 97.9 F (36.6 C) (Axillary)   Resp (!) 26   Ht 5\' 9"  (1.753 m)   Wt 59.3 kg   SpO2 97%   BMI 19.31 kg/m   I/O last 3 completed shifts: In: 3386.9 [I.V.:207.8; NG/GT:2979; IV Piggyback:200.1] Out: 2575 [Urine:2575]    PHYSICAL EXAM: General:  Adult male lying in bed in NAD HEENT: MM pink/moist, pupils 3/reactive, right cortrak, left crani site without staples, approximated/ soft Neuro: remains aphasic, right hemiplegia, purposeful on left, does not f/c CV: rr, no murmur PULM:  Clear anteriorly  GI: soft, +BS, right abd with flap site approximated/ wnl, condom cath  Extremities: warm/dry, no edema, developing contracture RUE Skin: no rashes  RESOLVED PROBLEM LIST  Thrombocytopenia Acute hypoxic respiratory failure Aspiration PNA Mild hyperchloremic acidosis  ASSESSMENT AND PLAN    At risk for respiratory insufficiency / aspiration - extubated 9/23 Plan Stable on room air Continue aggressive PT/ OT, pulm hygiene CCS consulted by primary for  Gtube, going ?Thursday to be done in Endo  Lt BG ICH with midline shift and subfalcine herniation  complicated by cerebral edema and seizures s/p Craniectomy with evacuation  Plan  Per Neurology/ NSGY Ke[place]ra BID PT/ OT  Hypertensive Emergency  Plan  Tele monitoring Ongoing norvasc, hydralazine, labetalol  Prn labetalol/ apresoline for SBP >160  Acute on Chronic Kidney Disease Stage 3 Hypernatremia  Plan Pending BMP Ongoing free water 200 ml q 4hr Trend BMP / urinary output  Polysubstance/ ETOH abuse Ongoing agitation  Plan  Precedex off 9/28 with clonidine taper to finish 10/2 Daily thiamine, folate, MVI Daily Seroquel, monitor QTc  Dysphagia  Plan TF at goal via cortrak Mental status precludes SLP CCS consulted 9/28 for Gtube   Hyperglycemia Plan Controlled, continue SSI moderate/ CBG q 4  Anemia of critical illness - s/p 1U PRBC 9/17 Plan Trending CBC, no signs of bleeding  Nothing further to add.  PCCM will sign off.  Please do not hesitate to call us back if we can be of any further assistance.  Best Practice / Goals of Care / Disposition.    DVT PROPHYLAXIS: SCDs,  heparin  GI prophylaxis: PPI BID per tube Pain agitation: seroquel, clonidine taper 9/28 NUTRITION: NPO/ tube feedings at goal  MOBILITY: Bedrest GOALS OF CARE: Full code Foley catheter: condom catheter FAMILY DISCUSSIONS: no family at bedside DISPOSITION: Per primary team, possible CIR?   Kennieth Rad, MSN, AGACNP-BC [facility] Pgr: [phone] or if no answer [phone] 09/26/2019, 8:13 AM

## 2019-09-26 NOTE — Progress Notes (Signed)
  Speech Language Pathology Treatment: Dysphagia;Cognitive-Linquistic  Patient Details Name: Joshua [name] MRN: [id number] DOB: [birth year] Today's Date: 09/26/2019 Time: [id number] SLP Time Calculation (min) (ACUTE ONLY): 15 min  Assessment / Plan / Recommendation Clinical Impression  Joshua [name] was seen for swallow/communication/cognition with girlfriend present. Repositioned upright with max tactile support for head control as head listing to the left and downward during oral care. Significant oral pooling noted leaking onto chin and cloth laid on chest. SLP provided oral care mostly to bilateral sulci as pt bit toothette when placed past dentition. He moved left hand when mitt removed but did not follow motoric instructions with tactile support. Significant right inattention with eyes to left not coming midline when head moved. Ice chips were not presented at this time. Brief education to girlfriend and continued intervention for communication and swallow.    HPI HPI: Pt is a 38 year old male who presented with new seizure, and was found to have a large left BG intracranial hemorrhage s/p L frontal temporal craniectomy with evacuation 9/12. ETT 9/12-9/18; 9/18-9/23. PMH: HTN, alcoholism, noncompliance      SLP Plan  Continue with current plan of care       Recommendations  Diet recommendations: NPO Medication Administration: Via alternative means                Oral Care Recommendations: Oral care QID Follow up Recommendations: Other (comment)(depends on progress CIR vs SNF) SLP Visit Diagnosis: Dysphagia, unspecified (R13.10);Cognitive communication deficit [id number]) Plan: Continue with current plan of care       GO                [name] 09/26/2019, 4:24 PM  [name].Ed Risk analyst [phone] Office [phone]

## 2019-09-26 NOTE — Progress Notes (Addendum)
Subjective: Patient reports (aphasic)  Objective: Vital signs in last 24 hours: Temp:  [97.8 F (36.6 C)-99.3 F (37.4 C)] 99 F (37.2 C) (09/29 0800) Pulse Rate:  [86-120] 90 (09/29 0700) Resp:  [13-26] 26 (09/29 0700) BP: (76-138)/(63-103) 131/95 (09/29 0700) SpO2:  [95 %-99 %] 97 % (09/29 0700)  Intake/Output from previous day: 09/28 0701 - 09/29 0700 In: 2448 [I.V.:38.9; NG/GT:2209; IV Piggyback:200.1] Out: 2475 [Urine:2475] Intake/Output this shift: No intake/output data recorded.  Opens eyes to voice, does not follow commands. Neurologically without change. Purposeful movement left side, withdraws right side. Scalp incision healing nicely, flap site full. Belly incision healing nicely, bone flap palpable without fluid/swelling.   Lab Results: Recent Labs    09/24/19 0557  WBC 8.1  HGB 10.2*  HCT 30.8*  PLT 427*   BMET Recent Labs    09/24/19 0557 09/26/19 0801  NA 146* 144  K 4.9 4.8  CL 111 109  CO2 23 24  GLUCOSE 130* 121*  BUN 58* 63*  CREATININE 2.54* 2.39*  CALCIUM 9.7 9.6    Studies/Results: No results found.  Assessment/Plan:   LOS: 17 days  Supportive care continues. PEG planned. CIR?   [name] 09/26/2019, 9:55 AM   Continue supportive care.

## 2019-09-26 NOTE — Progress Notes (Signed)
STROKE TEAM PROGRESS NOTE   INTERVAL HISTORY Patient remains neurologically unchanged and aphasic with dense right hemiplegia.  Does not follow any commands.  Has purposeful movement on the left side and has dense right hemiplegia.  Patient's mother has agreed to PEG tube which is scheduled on Thursday by trauma team.  Blood pressure adequately controlled.  No changes  OBJECTIVE Vitals:   09/26/19 0500 09/26/19 0600 09/26/19 0700 09/26/19 0800  BP: (!) 135/94 (!) 128/103 (!) 131/95   Pulse: 96 93 90   Resp: (!) 23 19 (!) 26   Temp:    99 F (37.2 C)  TempSrc:    Axillary  SpO2: 99% 96% 97%   Weight:      Height:        CBC:  Recent Labs  Lab 09/20/19 0536 09/21/19 0450  09/23/19 0706 09/24/19 0557  WBC 16.3* 11.3*   < > 9.9 8.1  NEUTROABS 12.4* 9.0*  --   --   --   HGB 8.5* 7.8*   < > 9.3* 10.2*  HCT 28.2* 23.6*   < > 29.3* 30.8*  MCV 97.9 91.1   < > 91.8 90.6  PLT 361 316   < > 434* 427*   < > = values in this interval not displayed.    Basic Metabolic Panel:  Recent Labs  Lab 09/22/19 0543 09/23/19 0706 09/24/19 0557  NA 148* 144 146*  K 4.6 4.1 4.9  CL 112* 111 111  CO2 26 23 23   GLUCOSE 144* 121* 130*  BUN 52* 51* 58*  CREATININE 2.36* 2.27* 2.54*  CALCIUM 9.2 9.2 9.7  MG 2.5* 2.3  --   PHOS 6.4* 5.4* 5.8*    IMAGING Ct Head Code Stroke Wo Contrast 09/09/2019 1. 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  2. Intraventricular extension of hemorrhage. Small focus of intraparenchymal hemorrhage in the right basal ganglia.   CT head  CTA Head  09/10/2019 1. Status post left pterional craniectomy for evacuation of left basal ganglia hematoma. Residual hematoma with interspersed postoperative gas measures 6.1 x 2.9 cm. 2. Decreased midline shift, now measuring 6 mm to the right. 3. Unchanged punctate focus of hemorrhage at the posterior right  insula. 4. Unchanged intraventricular blood. 5. No intracranial arterial occlusion or high-grade stenosis. No aneurysm or vascular malformation.   CT head  09/13/2019 Stable changes post left frontoparietal craniectomy for evacuation of left-sided central hemorrhagic stroke. Residual without significant change measuring 2.8 x 6.5 cm with less air within the hemorrhage. Stable postsurgical change with mild edema/mass effect and stable midline shift to the right of 5-6 mm. Decrease in density 4 mm focus of hemorrhage over the right basal ganglia.  CT Head W/O Contrast 09/17/19 1. Unchanged size of intraparenchymal hematoma centered in the left basal ganglia with unchanged surrounding edema and mass effect. 2. Unchanged rightward midline shift, measuring 6 mm.  Dg Chest Port 1 View 09/23/19 There has been interval endotracheal extubation. There is no acute appearing airspace opacity. Cardiomegaly. 09/18/2019 1. Endotracheal tube, feeding tube in stable position. PICC line tip noted over the right atrium. 2.  Stable cardiomegaly. 3.  Low lung volumes with right base atelectasis/infiltrate. 09/17/2019 1. Lungs now clear. 2. No new abnormalities. 3. Stable support apparatus. 09/16/2019 1. No acute cardiopulmonary disease. Right lung base atelectasis has improved. 2. Support apparatus is stable and well positioned. 09/15/2019 1.  Lines and tubes in good anatomic position. 2.  Progressive right lower lobe atelectasis/infiltrate. 09/10/2019 Resolving right midlung atelectasis. Tubes and lines as described. 09/09/2019 No acute cardiopulmonary abnormality. Appropriate positioning of the endotracheal and transesophageal tubes.   Renal US 09/13/2019 1. Mildly echogenic kidneys likely related to underlying medical renal disease. 2. Mild bilateral hydronephrosis.  No shadowing stone.  ECG - SR rate 97 BPM. LVH and prolonged QT interval. (See cardiology reading for complete details) Transthoracic  Echocardiogram  09/16/2019 1. Left ventricular ejection fraction, by visual estimation, is 70 to 75%. The left ventricle has hyperdynamic function. Normal left ventricular size. There is severely increased left ventricular hypertrophy.  2. Global right ventricle has normal systolic function.The right ventricular size is normal. No increase in right ventricular wall thickness.  3. Left atrial size was normal.  4. Right atrial size was moderately dilated.  5. The mitral valve is grossly normal. Mild mitral valve regurgitation.  6. The tricuspid valve is grossly normal. Tricuspid valve regurgitation is trivial.  7. The aortic valve is tricuspid Aortic valve regurgitation was not visualized by color flow Doppler.  8. The pulmonic valve was grossly normal. Pulmonic valve regurgitation is not visualized by color flow Doppler.  9. The inferior vena cava was not able to be adequately assessed to estimate CVP.   PHYSICAL EXAM     General -frail [name] Dr. Willaim Rayas stroke team right for 1 week and can stop he can stop 1 of the 2 will think with if we have to have to stop we could even stop both for a few days and make sure he stable before you restart having her today the risk of a stroke versus risk of bleeding and having hemodynamic instability surface cultures dropped by 3 points probably should hold aspirin Plavix for a few days and then if you when you are attending feels it is safe to restart I will start back only 1 of them which would be coated and probably today coated aspirin at this time off precedex, lethargic but less agitated than prior notes stated.   Ophthalmologic - fundi not visualized due to noncooperation.  Cardiovascular - Regular rate and rhythm.  Neurological Exam :  -awake eyes open, globally aphasic.  Nonverbal, not following commands. Eyes left gaze preference, blinking to visual threat on the left, not tracking, pupils reactive to light. Right facial droop.   Tongue protrusion not cooperative. Spontaneous movement on the left against gravity. On pain stimulation, RUE 0/5 and RLE slight withdraw on pain. Significantly increased muscle tone on right UE. DTR 3+ on the right and toes upgoing on the right. Sensation, coordination not cooperative and gait not tested.    ASSESSMENT/PLAN   [name] is a 38 y.o. male with history of admission for Etoh poisoning presenting with a seizure, vomiting.   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 In addition, small focus of R basal ganglia ICH.   NSG - decompressive craniectomy and hematoma evacuation w/ flap in R abd - 09/09/19 Dr Vertell Limber  Code Stroke CT Head - Large intraparenchymal hematoma of the left basal ganglia measuring 7.8 x 4.8 cm with 14 mm of rightward midline shift and early subfalcine herniation. Intraventricular extension of hemorrhage. Small focus of intraparenchymal hemorrhage in the right basal ganglia.   CT/CTA head - 09/10/2019 - Status post left pterional craniectomy for evacuation of left basal  ganglia hematoma. Residual hematoma with interspersed postoperative gas measures 6.1 x 2.9 cm. Decreased midline shift, now measuring 6 mm to the right. No intracranial arterial occlusion or high-grade stenosis. No aneurysm or vascular malformation  Ct head 9/16 and 9/20 stable crani. Stable 5-6 midline shift. Decreased R basal ganglia hemorrhage   HgbA1c 5.6  LDL 86  TTE 09/16/19 EF 70 to 75%. There is severely increased left ventricular hypertrophy.  [employer] Virus 2 - negative  UDS - negatived  VTE prophylaxis - Heparin 5000 units sq tid   No antithrombotic prior to admission, now on No antithrombotic d/t hemorrhage   Therapy recommendations: CIR recommended. They are following.  Disposition:  Pending  Dr. Leonie Man discussed case with Dr. Florene Glen from [facility] who will take over as attending  tomorrow as CCM now signed off and stable to move out of ICU  Cerebral Edema  S/p crani and hematoma evacuation  Treated with 3% saline protocol, now off  Na - 146  Now Free water 200 q4h   CT repeat 09/17/19 - Unchanged size of intraparenchymal hematoma and surrounding edema and mass effect.   PICC line out 9/25, for midline  At 10 days out of onset, will allow gradually normalization of Na   Respiratory Failure  Secondary to [place]  Off sedation and extubated 9/18  Re-intubated 9/18 due to low O2 sat and not protect airway  Extubated 9/23, tolerating well  Off precedex, on seroquel 50 bid, clonidine taper  CCM signed off 9/29 /20  VAP vs Aspiration PNA (vomited PTA) Leukocytosis, Febrile  WBC - 8.1  T Max - 99.3  U/A neg 9/14  Blood cultures normal flora  CXR progressive RLL atx/infilatrate -> CXR - 09/16/19 neg  D/c Vanc, d/c Zosyn -> Unasyn 9/17-9/20 (Now off all antibiotics)  Hypertensive Emergency  Probable undiagnosed / untreated hypertension (severe LVH on ECG)  Home BP meds: none  Current BP meds: amlodipine 10, hydralazine 50 tid, labetolol 200 tid    Off Lisinopril D/t increasing Cr  SBP goal < 160 mm Hg     Off cleviprex   BP stable   Seizure  On presentation  Hx of one seizure previously  On Keppra 500 mg Bid  AKI on CKD stage III  ACE stopped 9/14.   Renal US mild echogenic kidneys c/w CKD  Creatinine - 2.39  Continue TF and free water    Alcohol abuse  Hx ETOH abuse  recent hospitalization for alcohol poisoning.   Drinks 1 pt [place] per day.   On FA/MVI/B1  On CIWA protocol.  Hyperlipidemia  Home Lipid lowering medication: none  LDL - 86  Current lipid lowering medication: None  May consider statin at discharge  Dysphagia Malnutrition . Secondary to stroke . NPO  Has cortrak and tube feeding @ 32  Speech consult re-ordered  PEG per trauma  Anemia of critical illness  Hgb  14.5->...10.2  Unclear etiology  On PPI BID  Close CBC monitoring  Other Stroke Risk Factors  Polysubstance abuse, UDS - negative 09/09/19  Other Active Problems  Hypokalemia - resolved   Mild Thrombocytopenia - resolved -  Hypocalcemia - resolved   Phosphorus - 6.4->5.4->5.8  U/A and culture -> U/A - negative ; Culture > 100k Klebsiella oxytoca. Will add ancef. Discussed with pharmacy who will add correct dose    Cardiomegaly.   Hospital day # 17 Patient apparently is neurologically stable.  Plan to transfer to neurology floor bed later today.  Trauma team  to place PEG tube for nutrition on Thursday.  Discussed yesterday with patient's girlfriend at the bedside and with mother over the phone.  Discussed with Dr. Florene Glen medical hospitalist team who will assume care of patient starting tomorrow.  Greater than 50% time during this 35-minute visit spent on counseling and coordination of care about his intracerebral hemorrhage discussion of care plan of care with care team and answering questions Antony Contras, MD Antony Contras, MD To contact Stroke Continuity provider, please refer to [email or web address] After hours, contact General Neurology

## 2019-09-26 NOTE — Progress Notes (Signed)
Physical Therapy Treatment Patient Details Name: Joshua [name] MRN: [id number] DOB: 08-25-1981 Today's Date: 09/26/2019    History of Present Illness This pt is a 38 year old with history of alcohol abuse, seizures, noncompliant hypertension who presented with new seizure right-sided hemiplegia and confusion was found to have a large left intracranial hemorrhage now s/p left frontoparietal craniectomy for evacuation of left basal ganglia hematoma on 09/09/19. Skull flap in abdomen.  Intubated 9/12 (day of admission) and extubated 09/15/19.     PT Comments    Pt con't to be non-verbal. Pt with no command follow this date. Pt con't to require total assist to maintain EOB sitting. Pt did tolerate standing with maxAx2 today however t/o entire session pt restless and trying to return to supine in bed. Pt remains unable to lift head and maintain extension and midline position. Acute PT to cont to follow.    Follow Up Recommendations  CIR     Equipment Recommendations  Wheelchair (measurements PT);Wheelchair cushion (measurements PT);Hospital bed;3in1 (PT)    Recommendations for Other Services Rehab consult     Precautions / Restrictions Precautions Precautions: Fall Precaution Comments: L crani, bone flap removed and in abdomen, NG, L wrist restraint and mitt Restrictions Weight Bearing Restrictions: No    Mobility  Bed Mobility Overal bed mobility: Needs Assistance Bed Mobility: Supine to Sit;Sit to Supine     Supine to sit: Max assist;+2 for physical assistance Sit to supine: Max assist;+2 for physical assistance   General bed mobility comments: assist for trunk and LE management, impaired sequencing and decreased strength  Transfers Overall transfer level: Needs assistance Equipment used: (2 person lift with gait belt and bed pad) Transfers: Sit to/from Stand Sit to Stand: Mod assist;+2 physical assistance         General transfer comment: pt tolerated standing however  unable to achieve full trunk and neck extension. Pt unable to advance L LE to side step to [facility]  Ambulation/Gait             General Gait Details: unable at this time   Stairs             Wheelchair Mobility    Modified Rankin (Stroke Patients Only) Modified Rankin (Stroke Patients Only) Pre-Morbid Rankin Score: No symptoms Modified Rankin: Severe disability     Balance Overall balance assessment: Needs assistance Sitting-balance support: Feet supported Sitting balance-Leahy Scale: Zero Sitting balance - Comments: total assist for sitting balance, pt with strong R lateral lean, maxA to maintain head in extension                                     Cognition Arousal/Alertness: Lethargic Behavior During Therapy: Restless;Impulsive Overall Cognitive Status: Difficult to assess                                 General Comments: pt with no command follow today, opens eyes to name, upon sitting EOB pt continuously trying to return to supine despite max verbal cues      Exercises General Exercises - Upper Extremity Elbow Extension: PROM;Right;5 reps;Supine Wrist Flexion: PROM;Right;5 reps;Supine Wrist Extension: PROM;Right;5 reps;Supine Digit Composite Flexion: PROM;Right;5 reps;Supine Composite Extension: PROM;Right;5 reps;Supine Other Exercises Other Exercises: PROM to bilat LE, pt very rigid/tight/resistant, espeically with knee extension in sitting, pt holding legs in adduction    General Comments General comments (  skin integrity, edema, etc.): VSS      Pertinent Vitals/Pain Pain Assessment: Faces Faces Pain Scale: No hurt Pain Location: generalized discomfort, especially with NG reinsertion Pain Descriptors / Indicators: Discomfort;Grimacing;Guarding Pain Intervention(s): Monitored during session;Limited activity within patient's tolerance;Repositioned    Home Living                      Prior Function             PT Goals (current goals can now be found in the care plan section) Acute Rehab PT Goals Patient Stated Goal: none stated Progress towards PT goals: Progressing toward goals    Frequency    Min 4X/week      PT Plan Current plan remains appropriate    Co-evaluation              AM-PAC PT "6 Clicks" Mobility   Outcome Measure  Help needed turning from your back to your side while in a flat bed without using bedrails?: Total Help needed moving from lying on your back to sitting on the side of a flat bed without using bedrails?: Total Help needed moving to and from a bed to a chair (including a wheelchair)?: Total Help needed standing up from a chair using your arms (e.g., wheelchair or bedside chair)?: Total Help needed to walk in hospital room?: Total Help needed climbing 3-5 steps with a railing? : Total 6 Click Score: 6    End of Session   Activity Tolerance: Treatment limited secondary to agitation Patient left: in bed;with call bell/phone within reach;with bed alarm set;with family/visitor present Nurse Communication: Mobility status PT Visit Diagnosis: Muscle weakness (generalized) (M62.81);Difficulty in walking, not elsewhere classified (R26.2);Hemiplegia and hemiparesis Hemiplegia - Right/Left: Right Hemiplegia - dominant/non-dominant: Dominant Hemiplegia - caused by: Nontraumatic intracerebral hemorrhage     Time: [id number] PT Time Calculation (min) (ACUTE ONLY): 21 min  Charges:  $Therapeutic Activity: 8-22 mins                     Kittie Plater, PT, DPT Acute Rehabilitation Services Pager #: [phone] Office #: [phone]    Berline Lopes 09/26/2019, 2:45 PM

## 2019-09-26 NOTE — Progress Notes (Signed)
Orthopedic Tech Progress Note Patient Details:  Joshua [name] 05-13-1981 [id number]  Patient ID: [name], male   DOB: Feb 12, 1981, 38 y.o.   MRN: [id number]   [place] Pink 09/26/2019, 8:24 AMCalled [employer] for Elbow splint.

## 2019-09-27 LAB — GLUCOSE, CAPILLARY
Glucose-Capillary: 124 mg/dL — ABNORMAL HIGH (ref 70–99)
Glucose-Capillary: 131 mg/dL — ABNORMAL HIGH (ref 70–99)
Glucose-Capillary: 132 mg/dL — ABNORMAL HIGH (ref 70–99)
Glucose-Capillary: 133 mg/dL — ABNORMAL HIGH (ref 70–99)
Glucose-Capillary: 142 mg/dL — ABNORMAL HIGH (ref 70–99)
Glucose-Capillary: 150 mg/dL — ABNORMAL HIGH (ref 70–99)

## 2019-09-27 MED ORDER — CEPHALEXIN 250 MG/5ML PO SUSR
500.0000 mg | Freq: Two times a day (BID) | ORAL | Status: AC
  Administered 2019-09-27 – 2019-09-29 (×6): 500 mg
  Filled 2019-09-27 (×8): qty 10

## 2019-09-27 NOTE — Progress Notes (Signed)
Report called to [place] on 3 W.

## 2019-09-27 NOTE — Progress Notes (Addendum)
Subjective: Patient reports (aphasic)  Objective: Vital signs in last 24 hours: Temp:  [97.8 F (36.6 C)-98.9 F (37.2 C)] 97.8 F (36.6 C) (09/30 0400) Pulse Rate:  [77-108] 92 (09/30 0600) Resp:  [8-22] 15 (09/30 0600) BP: (98-134)/(67-97) 121/88 (09/30 0600) SpO2:  [91 %-98 %] 97 % (09/30 0600) Weight:  [60.8 kg] 60.8 kg (09/30 0500)  Intake/Output from previous day: 09/29 0701 - 09/30 0700 In: 2375 [NG/GT:2175; IV Piggyback:200] Out: 1975 [Urine:1975] Intake/Output this shift: No intake/output data recorded.  Without change neurologically. Purposeful movement left, withdraws right. Scalp incision healed, flap site full. Abdomen without swelling, bone flap palpable. Incision healed.   Lab Results: No results for input(s): WBC, HGB, HCT, PLT in the last 72 hours. BMET Recent Labs    09/26/19 0801  NA 144  K 4.8  CL 109  CO2 24  GLUCOSE 121*  BUN 63*  CREATININE 2.39*  CALCIUM 9.6    Studies/Results: No results found.  Assessment/Plan:   LOS: 18 days  Pending transfer to floor. PEG planned for tomorrow.   Joshua [name] 09/27/2019, 8:21 AM   Patient to receive PEG tube.  Continue therapies.

## 2019-09-27 NOTE — Progress Notes (Signed)
STROKE TEAM PROGRESS NOTE   INTERVAL HISTORY Patient remains awake aphasic not following commands.  He has spontaneous movements on the left side and side-to-side eye movements and moves his head.  Blood pressure well controlled.  Vitals stable.  PEG tube scheduled for tomorrow OBJECTIVE Vitals:   09/27/19 0400 09/27/19 0500 09/27/19 0600 09/27/19 0800  BP: 114/80 (!) 127/92 121/88   Pulse: 96 93 92   Resp: 16 14 15    Temp: 97.8 F (36.6 C)   98.9 F (37.2 C)  TempSrc: Axillary   Axillary  SpO2: 97% 96% 97%   Weight:  60.8 kg    Height:        CBC:  Recent Labs  Lab 09/21/19 0450  09/23/19 0706 09/24/19 0557  WBC 11.3*   < > 9.9 8.1  NEUTROABS 9.0*  --   --   --   HGB 7.8*   < > 9.3* 10.2*  HCT 23.6*   < > 29.3* 30.8*  MCV 91.1   < > 91.8 90.6  PLT 316   < > 434* 427*   < > = values in this interval not displayed.    Basic Metabolic Panel:  Recent Labs  Lab 09/22/19 0543 09/23/19 0706 09/24/19 0557 09/26/19 0801  NA 148* 144 146* 144  K 4.6 4.1 4.9 4.8  CL 112* 111 111 109  CO2 26 23 23 24   GLUCOSE 144* 121* 130* 121*  BUN 52* 51* 58* 63*  CREATININE 2.36* 2.27* 2.54* 2.39*  CALCIUM 9.2 9.2 9.7 9.6  MG 2.5* 2.3  --   --   PHOS 6.4* 5.4* 5.8*  --     IMAGING Ct Head Code Stroke Wo Contrast 09/09/2019 1. 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  2. Intraventricular extension of hemorrhage. Small focus of intraparenchymal hemorrhage in the right basal ganglia.   CT head  CTA Head  09/10/2019 1. Status post left pterional craniectomy for evacuation of left basal ganglia hematoma. Residual hematoma with interspersed postoperative gas measures 6.1 x 2.9 cm. 2. Decreased midline shift, now measuring 6 mm to the right. 3. Unchanged punctate focus of hemorrhage at the posterior right insula. 4. Unchanged intraventricular blood. 5. No intracranial  arterial occlusion or high-grade stenosis. No aneurysm or vascular malformation.   CT head  09/13/2019 Stable changes post left frontoparietal craniectomy for evacuation of left-sided central hemorrhagic stroke. Residual without significant change measuring 2.8 x 6.5 cm with less air within the hemorrhage. Stable postsurgical change with mild edema/mass effect and stable midline shift to the right of 5-6 mm. Decrease in density 4 mm focus of hemorrhage over the right basal ganglia.  CT Head W/O Contrast 09/17/19 1. Unchanged size of intraparenchymal hematoma centered in the left basal ganglia with unchanged surrounding edema and mass effect. 2. Unchanged rightward midline shift, measuring 6 mm.  Dg Chest Port 1 View 09/23/19 There has been interval endotracheal extubation. There is no acute appearing airspace opacity. Cardiomegaly. 09/18/2019 1. Endotracheal tube, feeding tube in stable position. PICC line tip noted over the right atrium. 2.  Stable cardiomegaly. 3.  Low lung volumes with right base atelectasis/infiltrate. 09/17/2019 1. Lungs now clear. 2. No new abnormalities. 3. Stable support apparatus. 09/16/2019 1. No acute cardiopulmonary disease. Right lung base atelectasis has improved. 2. Support apparatus is stable and well positioned. 09/15/2019 1.  Lines and tubes in good anatomic position. 2.  Progressive right lower lobe atelectasis/infiltrate. 09/10/2019 Resolving right midlung atelectasis. Tubes and lines as described. 09/09/2019 No acute cardiopulmonary abnormality. Appropriate positioning of the endotracheal and transesophageal tubes.   Renal US 09/13/2019 1. Mildly echogenic kidneys likely related to underlying medical renal disease. 2. Mild bilateral hydronephrosis.  No shadowing stone.  ECG - SR rate 97 BPM. LVH and prolonged QT interval. (See cardiology reading for complete details) Transthoracic Echocardiogram  09/16/2019 1. Left ventricular ejection fraction, by  visual estimation, is 70 to 75%. The left ventricle has hyperdynamic function. Normal left ventricular size. There is severely increased left ventricular hypertrophy.  2. Global right ventricle has normal systolic function.The right ventricular size is normal. No increase in right ventricular wall thickness.  3. Left atrial size was normal.  4. Right atrial size was moderately dilated.  5. The mitral valve is grossly normal. Mild mitral valve regurgitation.  6. The tricuspid valve is grossly normal. Tricuspid valve regurgitation is trivial.  7. The aortic valve is tricuspid Aortic valve regurgitation was not visualized by color flow Doppler.  8. The pulmonic valve was grossly normal. Pulmonic valve regurgitation is not visualized by color flow Doppler.  9. The inferior vena cava was not able to be adequately assessed to estimate CVP.   PHYSICAL EXAM       General -frail [name] Dr. Willaim Rayas stroke team right for 1 week and can stop he can stop 1 of the 2 will think with if we have to have to stop we could even stop both for a few days and make sure he stable before you restart having her today the risk of a stroke versus risk of bleeding and having hemodynamic instability surface cultures dropped by 3 points probably should hold aspirin Plavix for a few days and then if you when you are attending feels it is safe to restart I will start back only 1 of them which would be coated and probably today coated aspirin at this time off precedex, lethargic but less agitated than prior notes stated.   Ophthalmologic - fundi not visualized due to noncooperation.  Cardiovascular - Regular rate and rhythm.  Neurological Exam :  -awake eyes open, globally aphasic.  Nonverbal, not following commands. Eyes left gaze preference but has some spontaneous side-to-side eye movements., blinking to visual threat on the left, not tracking, pupils reactive to light. Right facial droop.  Tongue protrusion  not cooperative. Spontaneous movement on the left against gravity. On pain stimulation, RUE 0/5 and RLE slight withdraw on pain. Significantly increased muscle tone on right UE. DTR 3+ on the right and toes upgoing on the right. Sensation, coordination not cooperative and gait not tested.    ASSESSMENT/PLAN   Mr. Bella Ohmann is a 38 y.o. male with history of admission for Etoh poisoning presenting with a seizure, vomiting.   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 In addition, small focus of R basal ganglia ICH.   NSG - decompressive craniectomy and hematoma evacuation w/ flap in R abd - 09/09/19 Dr Vertell Limber  Code Stroke CT Head - Large intraparenchymal hematoma of the left basal ganglia measuring 7.8 x 4.8 cm with 14 mm of rightward midline shift and early subfalcine herniation. Intraventricular extension of hemorrhage. Small focus of intraparenchymal hemorrhage in the right basal ganglia.   CT/CTA head - 09/10/2019 - Status post left pterional craniectomy for evacuation of left basal  ganglia hematoma. Residual hematoma with interspersed postoperative gas measures 6.1 x 2.9 cm. Decreased midline shift, now measuring 6 mm to the right. No intracranial arterial occlusion or high-grade stenosis. No aneurysm or vascular malformation  Ct head 9/16 and 9/20 stable crani. Stable 5-6 midline shift. Decreased R basal ganglia hemorrhage   HgbA1c 5.6  LDL 86  TTE 09/16/19 EF 70 to 75%. There is severely increased left ventricular hypertrophy.  [employer] Virus 2 - negative  UDS - negatived  VTE prophylaxis - Heparin 5000 units sq tid   No antithrombotic prior to admission, now on No antithrombotic d/t hemorrhage   Therapy recommendations: CIR recommended. They are following.  Disposition:  Pending  Awaiting bed on the floor  TR[facility] will take over as attending today   Cerebral Edema  S/p crani  and hematoma evacuation  Treated with 3% saline protocol, now off  Na - 146  Now Free water 200 q4h   CT repeat 09/17/19 - Unchanged size of intraparenchymal hematoma and surrounding edema and mass effect.   PICC line out 9/25, for midline  At 10 days out of onset, will allow gradually normalization of Na   Respiratory Failure  Secondary to [place]  Off sedation and extubated 9/18  Re-intubated 9/18 due to low O2 sat and not protect airway  Extubated 9/23, tolerating well  Off precedex, on seroquel 50 bid, clonidine taper  CCM signed off 9/29 /20  VAP vs Aspiration PNA (vomited PTA) Leukocytosis, Febrile - resolved  WBC - 8.1  T Max - 99  U/A neg 9/14  Blood cultures normal flora  CXR progressive RLL atx/infilatrate -> CXR - 09/16/19 neg  D/c Vanc, d/c Zosyn -> Unasyn 9/17-9/20  Hypertensive Emergency  Probable undiagnosed / untreated hypertension (severe LVH on ECG)  Home BP meds: none  Current BP meds: amlodipine 10, hydralazine 50 tid, labetolol 200 tid    Off Lisinopril D/t increasing Cr  SBP goal < 160 mm Hg     Off cleviprex   BP stable   Seizure  On presentation  Hx of one seizure previously  On Keppra 500 mg Bid  AKI on CKD stage III  ACE stopped 9/14.   Renal US mild echogenic kidneys c/w CKD  Creatinine - 2.39  Continue TF and free water    Alcohol abuse  Hx ETOH abuse  recent hospitalization for alcohol poisoning.   Drinks 1 pt [place] per day.   On FA/MVI/B1  On CIWA protocol.  Hyperlipidemia  Home Lipid lowering medication: none  LDL - 86  Current lipid lowering medication: None  May consider statin at discharge  Dysphagia Malnutrition . Secondary to stroke . NPO  Has cortrak and tube feeding @ 53 on hold  Speech following - unable to clear for diet  PEG per trauma 10/1  Anemia of critical illness  Hgb 14.5->...10.2  Unclear etiology  On PPI BID  Close CBC monitoring  Other Stroke Risk  Factors  Polysubstance abuse, UDS - negative 09/09/19  Other Active Problems  Hypokalemia - resolved   Mild Thrombocytopenia - resolved  Hypocalcemia - resolved   Phosphorus - 6.4->5.4->5.8  U/A and culture -> U/A - negative ; Culture > 100k Klebsiella oxytoca. ancef 9/28, enteral keflex 9/30>>.   Cardiomegaly.   Hospital day # 18 Continue tube feeds for now.  Trach tube by trauma team tomorrow.  Continue ongoing therapies.  Appreciate medical hospitalist team assuming care as primary.  Stroke team will continue to follow along.  Transfer to stepdown unit or neurology floor when bed available.  Discussed with Dr. Sloan Leiter.  Greater than 50% time during this 35-minute visit was spent on counseling and coordination of care and discussion with care team answering questions  Antony Contras, MD  To contact Stroke Continuity provider, please refer to [email or web address] After hours, contact General Neurology

## 2019-09-27 NOTE — Progress Notes (Signed)
PROGRESS NOTE        PATIENT DETAILS Name: Joshua [name] Age: 38 y.o. Sex: male Date of Birth: [birth year] Admit Date: 09/09/2019 Admitting Physician Erline Levine, MD [id number], [name]  Brief Narrative: Patient is a 38 y.o. male with history of HTN, seizure disorder, alcohol abuse-who with seizure and right-sided hemiplegia-upon further evaluation was found to have a large left intracranial hemorrhage.  Patient was intubated-admitted to the ICU.  Evaluated by neurosurgery-underwent left frontal temporal craniectomy and evacuation of the hematoma.  Upon further improvement-transferred to the triad hospitalist service on 9/30.  Significant events: 9/12>>Admitted/  operating room for left frontal temporal craniectomy and evacuation hematoma 9/18 extubated > reintubated due to protect airway and low oxygen 9/23 > Extubated  9/30>> transferred to triad hospitalist.  Subjective: Awake-but aphasic.  Has dense right-sided hemiplegia.  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  CTA head negative for aneurysm or vascular malformation.  neurosurgery and neurology following.  For PEG tube placement tomorrow-hopefully to CIR later this week.  Aspiration pneumonia: Continue n.p.o. status-for PEG tube tomorrow.  On Keflex.  Dysphagia: Secondary to CVA-PEG tube tentatively scheduled for tomorrow.  AKI versus CKD stage IV: No improvement in creatinine over the next few days-probably could have hypertensive nephrosclerosis.  UA negative for proteinuria.  Ultrasound shows mildly echogenic kidneys-but no hydronephrosis.  EtOH abuse: No obvious signs of  withdrawal-on clonidine taper.  HTN: Stable-continue with Norvasc, labetalol and hydralazine.  Seizure: Continue Keppra  Nutrition Problem: Nutrition Problem: Inadequate oral intake Etiology: inability to eat Signs/Symptoms: NPO status Interventions: Refer to RD note for recommendations   Diet: Diet Order            Diet NPO time specified  Diet effective midnight               DVT Prophylaxis: Prophylactic Heparin   Code Status: Full code   Family Communication: None at bedside  Disposition Plan: Remain inpatient-?cir on discharge  Antimicrobial agents: Anti-infectives (From admission, onward)   Start     Dose/Rate Route Frequency Ordered Stop   09/27/19 1130  cephALEXin (KEFLEX) 250 MG/5ML suspension 500 mg     500 mg Per Tube Every 12 hours 09/27/19 1052 09/30/19 0959   09/25/19 1445  ceFAZolin (ANCEF) IVPB 2g/100 mL premix  Status:  Discontinued     2 g 200 mL/hr over 30 Minutes Intravenous Every 12 hours 09/25/19 1417 09/27/19 1052   09/14/19 1000  Ampicillin-Sulbactam (UNASYN) 3 g in sodium chloride 0.9 % 100 mL IVPB     3 g 200 mL/hr over 30 Minutes Intravenous Every 8 hours 09/14/19 0922 09/17/19 1816   09/13/19 1100  vancomycin (VANCOCIN) IVPB 750 mg/150 ml premix  Status:  Discontinued     750 mg 150 mL/hr over 60 Minutes Intravenous Every 24 hours 09/13/19 0728 09/13/19 0838   09/12/19 1100  vancomycin (VANCOCIN) IVPB 1000 mg/200 mL premix  Status:  Discontinued     1,000 mg 200 mL/hr over 60 Minutes Intravenous Every 24 hours 09/11/19 1034 09/13/19 0728   09/11/19 1130  piperacillin-tazobactam (ZOSYN) IVPB 3.375 g  Status:  Discontinued     3.375 g 12.5 mL/hr over 240 Minutes Intravenous Every 8 hours 09/11/19 1054 09/14/19 0845   09/11/19 1100  vancomycin (VANCOCIN) 1,500 mg in sodium chloride 0.9 % 500 mL IVPB     1,500 mg 250 mL/hr over 120 Minutes Intravenous  Once 09/11/19 1034 09/11/19 1900   09/11/19 1015  piperacillin-tazobactam (ZOSYN)  IVPB 4.5 g  Status:  Discontinued     4.5 g 200 mL/hr over 30 Minutes Intravenous Every 8 hours 09/11/19 1010 09/11/19 1052   09/09/19 0845  ceFAZolin (ANCEF) IVPB 2g/100 mL premix     2 g 200 mL/hr over 30 Minutes Intravenous Every 8 hours 09/09/19 0842 09/09/19 1658      Procedures:  left frontal temporal craniectomy and evacuation hematoma>> 9/12 ETT > 9/12- 9/18 re-intubated 9/18-9/23 Right radial A-line 9/12 > 9/15 L cortrak 9/14 >> Right Brachial PICC 9/14 >> 9/19  CONSULTS:  pulmonary/intensive care, neurology and Neurosurgery  Time spent: 25 minutes-Greater than 50% of this time was spent in counseling, explanation of diagnosis, planning of further management, and coordination of care.  MEDICATIONS: Scheduled Meds:  alteplase  2 mg Intracatheter Once   amLODipine  10 mg Per Tube Daily   cephALEXin  500 mg Per Tube Q12H   chlorhexidine gluconate (MEDLINE KIT)  15 mL Mouth Rinse BID   Chlorhexidine Gluconate Cloth  6 each Topical Daily   cloNIDine  0.1 mg Per Tube Q6H   Followed by   Derrill Memo ON 09/28/2019] cloNIDine  0.1 mg Per Tube Q12H   Followed by   Derrill Memo ON 09/29/2019] cloNIDine  0.1 mg Per Tube Daily   folic acid  1 mg Per Tube Daily   free water  200 mL Per Tube Q4H   heparin injection (subcutaneous)  5,000 Units Subcutaneous Q8H   hydrALAZINE  50 mg Per Tube Q8H   insulin aspart  0-15 Units Subcutaneous Q4H   labetalol  200 mg Per Tube TID   levETIRAcetam  500 mg Per Tube BID   mouth rinse  15 mL Mouth Rinse 10 times per day   multivitamin  15 mL Per Tube Daily   pantoprazole sodium  40 mg Per Tube BID   QUEtiapine  50 mg Per Tube BID   sodium chloride flush  10-40 mL Intracatheter Q12H   thiamine  100 mg Per Tube Daily   Continuous Infusions:  feeding supplement (OSMOLITE 1.5 CAL) 65 mL/hr at 09/27/19 0200   PRN Meds:.acetaminophen **OR** acetaminophen, bisacodyl, hydrALAZINE, labetalol, [DISCONTINUED] ondansetron **OR**  ondansetron (ZOFRAN) IV, polyethylene glycol, promethazine, sodium chloride flush   PHYSICAL EXAM: Vital signs: Vitals:   09/27/19 0500 09/27/19 0600 09/27/19 0800 09/27/19 0900  BP: (!) 127/92 121/88  117/80  Pulse: 93 92  79  Resp: _0 Temp:   98.9 F (37.2 C)   TempSrc:   Axillary   SpO2: 96% 97%  97%  Weight: 60.8 kg     Height:       Filed Weights   09/24/19 0500 09/25/19 0500 09/27/19 0500  Weight: 61.1 kg 59.3 kg 60.8 kg   Body mass index is 19.79 kg/m.   Gen Exam:Alert-aphasic-really does not respond. HEENT:atraumatic, normocephalic Chest: B/L clear to auscultation anteriorly CVS:S1S2 regular Abdomen:soft non tender, non distended Extremities:no edema Neurology: Significant right-sided hemiplegia Skin: no rash  I have personally reviewed following labs and imaging studies  LABORATORY DATA: CBC: Recent Labs  Lab 09/21/19 0450 09/22/19 0543 09/23/19 0706 09/24/19  0557  WBC 11.3* 10.9* 9.9 8.1  NEUTROABS 9.0*  --   --   --   HGB 7.8* 8.4* 9.3* 10.2*  HCT 23.6* 27.6* 29.3* 30.8*  MCV 91.1 94.5 91.8 90.6  PLT 316 399 434* 427*    Basic Metabolic Panel: Recent Labs  Lab 09/21/19 0450 09/22/19 0543 09/23/19 0706 09/24/19 0557 09/26/19 0801  NA 149* 148* 144 146* 144  K 4.3 4.6 4.1 4.9 4.8  CL 114* 112* 111 111 109  CO2 _0 GLUCOSE 144* 144* 121* 130* 121*  BUN 46* 52* 51* 58* 63*  CREATININE 2.53* 2.36* 2.27* 2.54* 2.39*  CALCIUM 8.9 9.2 9.2 9.7 9.6  MG 2.3 2.5* 2.3  --   --   PHOS 4.5 6.4* 5.4* 5.8*  --     GFR: Estimated Creatinine Clearance: 35.7 mL/min (A) (by C-G formula based on SCr of 2.39 mg/dL (H)).  Liver Function Tests: Recent Labs  Lab 09/24/19 0557  AST 27  ALT 29  ALKPHOS 54  BILITOT 0.3  PROT 7.5  ALBUMIN 2.8*   No results for input(s): LIPASE, AMYLASE in the last 168 hours. No results for input(s): AMMONIA in the last 168 hours.  Coagulation Profile: Recent Labs  Lab 09/20/19 1414  INR 1.2      Cardiac Enzymes: No results for input(s): CKTOTAL, CKMB, CKMBINDEX, TROPONINI in the last 168 hours.  BNP (last 3 results) No results for input(s): PROBNP in the last 8760 hours.  HbA1C: No results for input(s): HGBA1C in the last 72 hours.  CBG: Recent Labs  Lab 09/26/19 1943 09/26/19 2310 09/27/19 0315 09/27/19 0753 09/27/19 1151  GLUCAP 134* 167* 150* 142* 133*    Lipid Profile: No results for input(s): CHOL, HDL, LDLCALC, TRIG, CHOLHDL, LDLDIRECT in the last 72 hours.  Thyroid Function Tests: No results for input(s): TSH, T4TOTAL, FREET4, T3FREE, THYROIDAB in the last 72 hours.  Anemia Panel: No results for input(s): VITAMINB12, FOLATE, FERRITIN, TIBC, IRON, RETICCTPCT in the last 72 hours.  Urine analysis:    Component Value Date/Time   COLORURINE YELLOW (A) 09/23/2019 1108   APPEARANCEUR CLEAR (A) 09/23/2019 1108   LABSPEC 1.015 09/23/2019 1108   PHURINE 7.5 09/23/2019 1108   GLUCOSEU NEGATIVE 09/23/2019 1108   HGBUR NEGATIVE 09/23/2019 1108   [place] 09/23/2019 1108   [place] 09/23/2019 1108   PROTEINUR NEGATIVE 09/23/2019 1108   UROBILINOGEN 1.0 04/05/2009 0021   NITRITE NEGATIVE 09/23/2019 1108   LEUKOCYTESUR NEGATIVE 09/23/2019 1108    Sepsis Labs: Lactic Acid, Venous No results found for: LATICACIDVEN  MICROBIOLOGY: Recent Results (from the past 240 hour(s))  Urine Culture     Status: Abnormal   Collection Time: 09/23/19 10:01 AM   Specimen: Urine, Random  Result Value Ref Range Status   Specimen Description URINE, RANDOM  Final   Special Requests   Final    NONE Performed at [facility], [place] [address] [place], [place] [address]    Culture >=100,000 COLONIES/mL KLEBSIELLA OXYTOCA (A)  Final   Report Status 09/25/2019 FINAL  Final   Organism ID, Bacteria KLEBSIELLA OXYTOCA (A)  Final      Susceptibility   Klebsiella oxytoca - MIC*    AMPICILLIN >=32 RESISTANT Resistant     CEFAZOLIN <=4 SENSITIVE  Sensitive     CEFTRIAXONE <=1 SENSITIVE Sensitive     CIPROFLOXACIN <=0.25 SENSITIVE Sensitive     GENTAMICIN <=1 SENSITIVE Sensitive     IMIPENEM <=0.25 SENSITIVE  Sensitive     NITROFURANTOIN <=16 SENSITIVE Sensitive     TRIMETH/SULFA <=20 SENSITIVE Sensitive     AMPICILLIN/SULBACTAM >=32 RESISTANT Resistant     PIP/TAZO >=128 RESISTANT Resistant     Extended ESBL NEGATIVE Sensitive     * >=100,000 COLONIES/mL KLEBSIELLA OXYTOCA    RADIOLOGY STUDIES/RESULTS: Ct Angio Head W Or Wo Contrast  Result Date: 09/10/2019 CLINICAL DATA:  Intracranial hemorrhage follow up EXAM: CT HEAD WITHOUT CONTRAST CT ANGIOGRAPHY OF THE HEAD TECHNIQUE: Contiguous axial images were obtained from the base of the skull through the vertex without intravenous contrast. Multidetector CT imaging of the head was performed using the standard protocol during bolus administration of intravenous contrast. Multiplanar CT image reconstructions and MIPs were obtained to evaluate the vascular anatomy. CONTRAST:  70m OMNIPAQUE IOHEXOL 350 MG/ML SOLN COMPARISON:  Head CT 09/09/2019 FINDINGS: CT HEAD FINDINGS Brain: Status post hematoma evacuation. Residual hematoma with interspersed postoperative gas measures 6.1 x 2.9 cm, centered in the left basal ganglia. Entrapment of the right lateral ventricle temporal horn has resolved. Volume a blood within the lateral ventricles is unchanged. Punctate focus of hemorrhage at the posterior right insula is unchanged. There is now extra-axial blood over the left convexity underlying the craniectomy site. Midline shift has improved, now measuring 6 mm to the right. Skull: Left pterional craniectomy Sinuses/Orbits: Negative CTA HEAD FINDINGS POSTERIOR CIRCULATION: --Vertebral arteries: Normal V4 segments. --Posterior inferior cerebellar arteries (PICA): Patent origins from the vertebral arteries. --Anterior inferior cerebellar arteries (AICA): Patent origins from the basilar artery. --Basilar artery:  Normal. --Superior cerebellar arteries: Normal. --Posterior cerebral arteries: Normal. Both originate from the basilar artery. Posterior communicating arteries (p-comm) are diminutive or absent. ANTERIOR CIRCULATION: --Intracranial internal carotid arteries: Normal. --Anterior cerebral arteries (ACA): Normal. Both A1 segments are present. Patent anterior communicating artery (a-comm). --Middle cerebral arteries (MCA): Normal. VENOUS SINUSES: As permitted by contrast timing, patent. ANATOMIC VARIANTS: None Review of the MIP images confirms the above findings. IMPRESSION: 1. Status post left pterional craniectomy for evacuation of left basal ganglia hematoma. Residual hematoma with interspersed postoperative gas measures 6.1 x 2.9 cm. 2. Decreased midline shift, now measuring 6 mm to the right. 3. Unchanged punctate focus of hemorrhage at the posterior right insula. 4. Unchanged intraventricular blood. 5. No intracranial arterial occlusion or high-grade stenosis. No aneurysm or vascular malformation. Electronically Signed   By: KUlyses JarredM.D.   On: 09/10/2019 05:58   Ct Head Wo Contrast  Result Date: 09/17/2019 CLINICAL DATA:  Intracranial hemorrhage follow up EXAM: CT HEAD WITHOUT CONTRAST TECHNIQUE: Contiguous axial images were obtained from the base of the skull through the vertex without intravenous contrast. COMPARISON:  Head CT 09/13/2019 FINDINGS: Brain: The size of the intraparenchymal hematoma centered in the left basal ganglia is unchanged. Surrounding edema and postsurgical changes also remain the same. There is a small amount of blood within both occipital horns. Rightward midline shift measures 6 mm at the level of the foramina Monro, unchanged. No new site of hemorrhage. Vascular: No abnormal hyperdensity of the major intracranial arteries or dural venous sinuses. No intracranial atherosclerosis. Skull: Left pterional craniectomy with soft tissue swelling overlying. Sinuses/Orbits: No fluid  levels or advanced mucosal thickening of the visualized paranasal sinuses. No mastoid or middle ear effusion. The orbits are normal. IMPRESSION: 1. Unchanged size of intraparenchymal hematoma centered in the left basal ganglia with unchanged surrounding edema and mass effect. 2. Unchanged rightward midline shift, measuring 6 mm. Electronically Signed   By: KCletus GashD.  On: 09/17/2019 01:46   Ct Head Wo Contrast  Result Date: 09/13/2019 CLINICAL DATA:  Stroke follow-up. EXAM: CT HEAD WITHOUT CONTRAST TECHNIQUE: Contiguous axial images were obtained from the base of the skull through the vertex without intravenous contrast. COMPARISON:  09/10/2019 and 09/09/2019 FINDINGS: Brain: Evidence of patient's recent evacuation of central left-sided hemorrhagic stroke. Residual hemorrhage over the left basal ganglia measures approximately 2.8 x 6.5 cm without significant change. There is less air within the central aspect of the hemorrhage. Stable adjacent postsurgical change, edema and minimal mass effect. Stable mild midline shift to the right of 5-6 mm. Persistent 4 mm hemorrhagic focus over the right basal ganglia is becoming less dense. Vascular: No hyperdense vessel or unexpected calcification. Skull: Evidence of recent right frontal parietal craniectomy. Skin staples over the left scalp. Sinuses/Orbits: Orbits are normal. Paranasal sinuses are well aerated. Other: Periodontal disease. IMPRESSION: Stable changes post left frontoparietal craniectomy for evacuation of left-sided central hemorrhagic stroke. Residual without significant change measuring 2.8 x 6.5 cm with less air within the hemorrhage. Stable postsurgical change with mild edema/mass effect and stable midline shift to the right of 5-6 mm. Decrease in density 4 mm focus of hemorrhage over the right basal ganglia. Electronically Signed   By: Marin Olp M.D.   On: 09/13/2019 08:24   Ct Head Wo Contrast  Result Date: 09/10/2019 CLINICAL DATA:   Intracranial hemorrhage follow up EXAM: CT HEAD WITHOUT CONTRAST CT ANGIOGRAPHY OF THE HEAD TECHNIQUE: Contiguous axial images were obtained from the base of the skull through the vertex without intravenous contrast. Multidetector CT imaging of the head was performed using the standard protocol during bolus administration of intravenous contrast. Multiplanar CT image reconstructions and MIPs were obtained to evaluate the vascular anatomy. CONTRAST:  58m OMNIPAQUE IOHEXOL 350 MG/ML SOLN COMPARISON:  Head CT 09/09/2019 FINDINGS: CT HEAD FINDINGS Brain: Status post hematoma evacuation. Residual hematoma with interspersed postoperative gas measures 6.1 x 2.9 cm, centered in the left basal ganglia. Entrapment of the right lateral ventricle temporal horn has resolved. Volume a blood within the lateral ventricles is unchanged. Punctate focus of hemorrhage at the posterior right insula is unchanged. There is now extra-axial blood over the left convexity underlying the craniectomy site. Midline shift has improved, now measuring 6 mm to the right. Skull: Left pterional craniectomy Sinuses/Orbits: Negative CTA HEAD FINDINGS POSTERIOR CIRCULATION: --Vertebral arteries: Normal V4 segments. --Posterior inferior cerebellar arteries (PICA): Patent origins from the vertebral arteries. --Anterior inferior cerebellar arteries (AICA): Patent origins from the basilar artery. --Basilar artery: Normal. --Superior cerebellar arteries: Normal. --Posterior cerebral arteries: Normal. Both originate from the basilar artery. Posterior communicating arteries (p-comm) are diminutive or absent. ANTERIOR CIRCULATION: --Intracranial internal carotid arteries: Normal. --Anterior cerebral arteries (ACA): Normal. Both A1 segments are present. Patent anterior communicating artery (a-comm). --Middle cerebral arteries (MCA): Normal. VENOUS SINUSES: As permitted by contrast timing, patent. ANATOMIC VARIANTS: None Review of the MIP images confirms the  above findings. IMPRESSION: 1. Status post left pterional craniectomy for evacuation of left basal ganglia hematoma. Residual hematoma with interspersed postoperative gas measures 6.1 x 2.9 cm. 2. Decreased midline shift, now measuring 6 mm to the right. 3. Unchanged punctate focus of hemorrhage at the posterior right insula. 4. Unchanged intraventricular blood. 5. No intracranial arterial occlusion or high-grade stenosis. No aneurysm or vascular malformation. Electronically Signed   By: KUlyses JarredM.D.   On: 09/10/2019 05:58   [place]al  Result Date: 09/13/2019 CLINICAL DATA:  38year old male with acute renal insufficiency.  EXAM: RENAL / URINARY TRACT ULTRASOUND COMPLETE COMPARISON:  None. FINDINGS: Evaluation is somewhat limited due to inability of the patient to cooperate with the exam. Right Kidney: Renal measurements: 10.9 x 4.9 x 6.4 cm = volume: 179 mL. There is increased renal parenchymal echogenicity. There is mild hydronephrosis of the right kidney. No shadowing stone. Left Kidney: Renal measurements: 9.8 x 4.8 x 5.9 cm = volume: 145 mL. There is increased renal parenchymal echogenicity. Mild hydronephrosis. No shadowing stone. Bladder: The urinary bladder is distended otherwise unremarkable. Ureteral jets are not visualized. IMPRESSION: 1. Mildly echogenic kidneys likely related to underlying medical renal disease. 2. Mild bilateral hydronephrosis.  No shadowing stone. Electronically Signed   By: Anner Crete M.D.   On: 09/13/2019 11:41   Dg Chest Port 1 View  Result Date: 09/23/2019 CLINICAL DATA:  Fever EXAM: PORTABLE CHEST 1 VIEW COMPARISON:  09/18/2019 FINDINGS: There has been interval endotracheal extubation. There is no acute appearing airspace opacity. Cardiomegaly. Partially imaged enteric feeding tube. IMPRESSION: 1. There has been interval endotracheal extubation. There is no acute appearing airspace opacity. 2.  Cardiomegaly. Electronically Signed   By: Eddie Candle M.D.   On:  09/23/2019 13:18   Dg Chest Port 1 View  Result Date: 09/18/2019 CLINICAL DATA:  Intubation.  Seizure.  Head injury. EXAM: PORTABLE CHEST 1 VIEW COMPARISON:  09/17/2019. FINDINGS: Endotracheal tube, feeding tube in stable position position. PICC line tip noted over the right atrium. Stable cardiomegaly. Low lung volumes with right base atelectasis/infiltrate. No pleural effusion or pneumothorax. IMPRESSION: 1. Endotracheal tube, feeding tube in stable position. PICC line tip noted over the right atrium. 2.  Stable cardiomegaly. 3.  Low lung volumes with right base atelectasis/infiltrate. Electronically Signed   By: Marcello Moores  Register   On: 09/18/2019 06:54   Dg Chest Port 1 View  Result Date: 09/17/2019 CLINICAL DATA:  Respiratory failure. Follow-up exam. Intubated patient. EXAM: PORTABLE CHEST 1 VIEW COMPARISON:  09/16/2019 and exams. FINDINGS: Basilar atelectasis has improved.  Lungs now clear. No pneumothorax or pleural effusion. Endotracheal tube, enteric tube and left PICC are stable. IMPRESSION: 1. Lungs now clear. 2. No new abnormalities. 3. Stable support apparatus. Electronically Signed   By: Lajean Manes M.D.   On: 09/17/2019 06:35   Dg Chest Port 1 View  Result Date: 09/16/2019 CLINICAL DATA:  Acute respiratory failure.  Follow-up exam. EXAM: PORTABLE CHEST 1 VIEW COMPARISON:  09/15/2019 and earlier exams. FINDINGS: Cardiac silhouette mildly enlarged. Atelectasis noted at the right lung base on the prior study has improved. Remainder of the lungs is clear. Endotracheal tube, left-sided PICC and enteric feeding tube are stable. IMPRESSION: 1. No acute cardiopulmonary disease. Right lung base atelectasis has improved. 2. Support apparatus is stable and well positioned. Electronically Signed   By: Lajean Manes M.D.   On: 09/16/2019 11:36   Dg Chest Port 1 View  Result Date: 09/15/2019 CLINICAL DATA:  Respiratory failure. EXAM: PORTABLE CHEST 1 VIEW COMPARISON:  Chest x-ray 09/10/2019.  FINDINGS: Endotracheal tube noted with tip 3 cm above the carina. Feeding tube noted with tip below left hemidiaphragm. Left PICC line noted with tip at cavoatrial junction. Stable cardiomegaly. Progressive right lower lobe atelectasis/infiltrate. No pleural effusion or pneumothorax. IMPRESSION: 1.  Lines and tubes in good anatomic position. 2.  Progressive right lower lobe atelectasis/infiltrate. Electronically Signed   By: Marcello Moores  Register   On: 09/15/2019 08:14   Dg Chest Port 1 View  Result Date: 09/10/2019 CLINICAL DATA:  Endotracheal tube placement.  EXAM: PORTABLE CHEST 1 VIEW COMPARISON:  09/09/2019 FINDINGS: Endotracheal tube has tip 4.1 cm above the carina. Enteric tube courses into the stomach and off the film as tip is not visualized. Lungs are adequately inflated with mild stable elevation of the right hemidiaphragm. No focal lobar consolidation or effusion. Previous seen hazy density over the right midlung is improved to nearly resolved likely resolving atelectasis. Cardiomediastinal silhouette and remainder of the exam is unchanged. IMPRESSION: Resolving right midlung atelectasis. Tubes and lines as described. Electronically Signed   By: Marin Olp M.D.   On: 09/10/2019 08:41   Dg Chest Port 1 View  Result Date: 09/09/2019 CLINICAL DATA:  Respiratory failure. EXAM: PORTABLE CHEST 1 VIEW COMPARISON:  09/09/2019 FINDINGS: Endotracheal tube tip 5 cm above the carina. Enteric tube courses into the stomach and off the film as tip is not visualized. Lungs are adequately inflated with mild elevation of the right hemidiaphragm. There is new hazy airspace opacification over the right mid to lower lung likely atelectasis. Left lung is clear. Cardiomediastinal silhouette and remainder of the exam is unchanged. IMPRESSION: New hazy opacification over the right midlung likely atelectasis. Tubes and lines as described. Electronically Signed   By: Marin Olp M.D.   On: 09/09/2019 11:22   Dg Chest Port  1 View  Result Date: 09/09/2019 CLINICAL DATA:  ET and OG placement EXAM: PORTABLE CHEST 1 VIEW COMPARISON:  None. FINDINGS: Transesophageal tube tip and side port distal to the GE junction terminating below the level of imaging. Endotracheal tube tip terminates appropriately in the mid trachea, approximately 5 cm from the carina. Additional support devices overlie the chest. No consolidation, features of edema, pneumothorax, or effusion. Pulmonary vascularity is normally distributed. The cardiomediastinal contours are unremarkable. No acute osseous or soft tissue abnormality. IMPRESSION: No acute cardiopulmonary abnormality. Appropriate positioning of the endotracheal and transesophageal tubes. Electronically Signed   By: Lovena Le M.D.   On: 09/09/2019 03:30   Dg Abd Portable 1v  Result Date: 09/21/2019 CLINICAL DATA:  Feeding tube placement EXAM: PORTABLE ABDOMEN - 1 VIEW COMPARISON:  None. FINDINGS: Feeding tube coils in the mid stomach. Nonobstructive bowel gas pattern. IMPRESSION: Feeding tube coils in the mid stomach. Electronically Signed   By: Rolm Baptise M.D.   On: 09/21/2019 21:11   Ct Head Code Stroke Wo Contrast  Result Date: 09/09/2019 CLINICAL DATA:  Code stroke.  Left-sided weakness EXAM: CT HEAD WITHOUT CONTRAST TECHNIQUE: Contiguous axial images were obtained from the base of the skull through the vertex without intravenous contrast. COMPARISON:  None. FINDINGS: Brain: There is an intraparenchymal hematoma centered in the left basal ganglia measuring 7.8 x 4.8 cm. There is rightward midline shift that measures 1.4 cm. There is early subfalcine herniation. Hemorrhage has extended into the lateral ventricles and third ventricle. There is a small focus of intraparenchymal hemorrhage at the right basal ganglia measuring approximately 4 mm. There is right ventricular entrapment with early dilatation of the temporal horn. Basal cisterns are effaced. Vascular: No hyperdense vessel or  unexpected calcification. Skull: Normal. Negative for fracture or focal lesion. Sinuses/Orbits: No acute finding. Other: None. ASPECTS [facility] Stroke Program Early CT Score) is not applicable in the case of acute hemorrhage. IMPRESSION: 1. 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 2. Intraventricular extension of hemorrhage. Small focus of intraparenchymal hemorrhage in the right basal ganglia. Critical Value/emergent results were called by telephone  at the time of interpretation on 09/09/2019 at 3:21 am to provider Dr. Kerney Elbe, Who verbally acknowledged these results. Electronically Signed   By: Ulyses Jarred M.D.   On: 09/09/2019 03:42   [place] Ekg Site Rite  Result Date: 09/11/2019 If Site Rite image not attached, placement could not be confirmed due to current cardiac rhythm.    LOS: 18 days   Oren Binet, MD  [facility]  If 7PM-7AM, please contact night-coverage  Please page via [email or web address]  Go to [email or web address] and use [place]'s universal password to access. If you do not have the password, please contact the hospital operator.  Locate the [facility] provider you are looking for under [facility] and page to a number that you can be directly reached. If you still have difficulty reaching the provider, please page the [facility] (Director on Call) for the Hospitalists listed on amion for assistance.  09/27/2019, 12:55 PM

## 2019-09-27 NOTE — Anesthesia Preprocedure Evaluation (Addendum)
Anesthesia Evaluation  Patient identified by MRN, date of birth, ID band Patient confused    Reviewed: Allergy & Precautions, NPO status , Patient's Chart, lab work & pertinent test results, Unable to perform ROS - Chart review only  History of Anesthesia Complications Negative for: history of anesthetic complications  Airway   TM Distance: >3 FB    Comment: Unable to comply with airway exam  Dental  (+) Dental Advisory Given   Pulmonary pneumonia,   Recent aspiration    breath sounds clear to auscultation       Cardiovascular hypertension, Pt. 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  CVA, Residual Symptoms    GI/Hepatic (+)     substance abuse  alcohol use,   Endo/Other    Renal/GU Renal InsufficiencyRenal disease     Musculoskeletal   Abdominal   Peds  Hematology  (+) anemia ,   Anesthesia Other Findings   Reproductive/Obstetrics                            Anesthesia Physical Anesthesia Plan  ASA: IV  Anesthesia Plan: General   Post-op Pain Management:    Induction: Intravenous and Rapid sequence  PONV Risk Score and Plan: 2 and Treatment may vary due to age or medical condition, Ondansetron, Dexamethasone and Midazolam  Airway Management Planned: Oral ETT  Additional Equipment: None  Intra-op Plan:   Post-operative Plan: Extubation in OR  Informed Consent: I have reviewed the patients History and Physical, chart, labs and discussed the procedure including the risks, benefits and alternatives for the proposed anesthesia with the patient or authorized representative who has indicated his/her understanding and acceptance.     Dental advisory given and History available from chart only  Plan Discussed with: CRNA and Anesthesiologist  Anesthesia Plan Comments:         Anesthesia Quick Evaluation

## 2019-09-27 NOTE — Progress Notes (Signed)
Arrived to unit

## 2019-09-28 ENCOUNTER — Inpatient Hospital Stay (HOSPITAL_COMMUNITY): Payer: Medicaid Other | Admitting: Anesthesiology

## 2019-09-28 ENCOUNTER — Inpatient Hospital Stay (HOSPITAL_COMMUNITY): Payer: Medicaid Other

## 2019-09-28 ENCOUNTER — Encounter (HOSPITAL_COMMUNITY)
Admission: EM | Disposition: A | Discharge status: Skilled Nursing Facility | Payer: Self-pay | Source: Home / Self Care | Attending: Internal Medicine

## 2019-09-28 HISTORY — PX: PEG PLACEMENT: SHX5437

## 2019-09-28 HISTORY — PX: ESOPHAGOGASTRODUODENOSCOPY (EGD) WITH PROPOFOL: SHX5813

## 2019-09-28 LAB — BASIC METABOLIC PANEL
Anion gap: 10 (ref 5–15)
BUN: 58 mg/dL — ABNORMAL HIGH (ref 6–20)
CO2: 24 mmol/L (ref 22–32)
Calcium: 10.1 mg/dL (ref 8.9–10.3)
Chloride: 110 mmol/L (ref 98–111)
Creatinine, Ser: 2.28 mg/dL — ABNORMAL HIGH (ref 0.61–1.24)
GFR calc Af Amer: 40 mL/min — ABNORMAL LOW (ref >60)
GFR calc non Af Amer: 35 mL/min — ABNORMAL LOW (ref >60)
Glucose, Bld: 115 mg/dL — ABNORMAL HIGH (ref 70–99)
Potassium: 5 mmol/L (ref 3.5–5.1)
Sodium: 144 mmol/L (ref 135–145)

## 2019-09-28 LAB — CBC
HCT: 33.1 % — ABNORMAL LOW (ref 39.0–52.0)
Hemoglobin: 10.7 g/dL — ABNORMAL LOW (ref 13.0–17.0)
MCH: 29.5 pg (ref 26.0–34.0)
MCHC: 32.3 g/dL (ref 30.0–36.0)
MCV: 91.2 fL (ref 80.0–100.0)
Platelets: 432 10*3/uL — ABNORMAL HIGH (ref 150–400)
RBC: 3.63 MIL/uL — ABNORMAL LOW (ref 4.22–5.81)
RDW: 14.1 % (ref 11.5–15.5)
WBC: 10.5 10*3/uL (ref 4.0–10.5)
nRBC: 0 % (ref 0.0–0.2)

## 2019-09-28 LAB — GLUCOSE, CAPILLARY
Glucose-Capillary: 109 mg/dL — ABNORMAL HIGH (ref 70–99)
Glucose-Capillary: 116 mg/dL — ABNORMAL HIGH (ref 70–99)
Glucose-Capillary: 108 mg/dL — ABNORMAL HIGH (ref 70–99)
Glucose-Capillary: 132 mg/dL — ABNORMAL HIGH (ref 70–99)
Glucose-Capillary: 149 mg/dL — ABNORMAL HIGH (ref 70–99)

## 2019-09-28 SURGERY — ESOPHAGOGASTRODUODENOSCOPY (EGD) WITH PROPOFOL
Anesthesia: General

## 2019-09-28 MED ORDER — ESMOLOL HCL 100 MG/10ML IV SOLN
INTRAVENOUS | Status: DC | PRN
  Administered 2019-09-28: 20 mg via INTRAVENOUS

## 2019-09-28 MED ORDER — LABETALOL HCL 5 MG/ML IV SOLN: 10.0000 mg | INTRAVENOUS | Status: DC | PRN

## 2019-09-28 MED ORDER — PHENYLEPHRINE 40 MCG/ML (10ML) SYRINGE FOR IV PUSH (FOR BLOOD PRESSURE SUPPORT)
PREFILLED_SYRINGE | INTRAVENOUS | Status: DC | PRN
  Administered 2019-09-28 (×3): 80 ug via INTRAVENOUS

## 2019-09-28 MED ORDER — FENTANYL CITRATE (PF) 100 MCG/2ML IJ SOLN
INTRAMUSCULAR | Status: DC | PRN
  Administered 2019-09-28: 50 ug via INTRAVENOUS

## 2019-09-28 MED ORDER — GLYCOPYRROLATE 0.2 MG/ML IJ SOLN
0.1000 mg | Freq: Three times a day (TID) | INTRAMUSCULAR | Status: DC
  Administered 2019-09-28 – 2019-10-06 (×23): 0.1 mg via INTRAVENOUS
  Filled 2019-09-28 (×23): qty 1

## 2019-09-28 MED ORDER — SODIUM CHLORIDE 0.9 % IV SOLN
INTRAVENOUS | Status: DC | PRN
  Administered 2019-09-28: 08:00:00 25 ug/min via INTRAVENOUS

## 2019-09-28 MED ORDER — ROCURONIUM BROMIDE 10 MG/ML (PF) SYRINGE
PREFILLED_SYRINGE | INTRAVENOUS | Status: DC | PRN
  Administered 2019-09-28: 50 mg via INTRAVENOUS

## 2019-09-28 MED ORDER — SODIUM BICARBONATE 650 MG PO TABS
650.0000 mg | ORAL_TABLET | Freq: Once | ORAL | Status: DC
  Filled 2019-09-28: qty 1

## 2019-09-28 MED ORDER — FENTANYL CITRATE (PF) 100 MCG/2ML IJ SOLN
INTRAMUSCULAR | Status: AC
  Filled 2019-09-28: qty 2

## 2019-09-28 MED ORDER — TRAMADOL HCL 50 MG PO TABS
25.0000 mg | ORAL_TABLET | Freq: Two times a day (BID) | ORAL | Status: DC | PRN
  Administered 2019-09-28 – 2019-10-31 (×9): 25 mg
  Filled 2019-09-28 (×9): qty 1

## 2019-09-28 MED ORDER — SODIUM CHLORIDE 0.9 % IV SOLN
INTRAVENOUS | Status: DC
  Administered 2019-09-28 (×2): via INTRAVENOUS

## 2019-09-28 MED ORDER — PANCRELIPASE (LIP-PROT-AMYL) 10440-39150 UNITS PO TABS
20880.0000 [IU] | ORAL_TABLET | Freq: Once | ORAL | Status: DC
  Filled 2019-09-28: qty 2

## 2019-09-28 MED ORDER — SUGAMMADEX SODIUM 200 MG/2ML IV SOLN
INTRAVENOUS | Status: DC | PRN
  Administered 2019-09-28: 200 mg via INTRAVENOUS

## 2019-09-28 MED ORDER — PROPOFOL 10 MG/ML IV BOLUS
INTRAVENOUS | Status: DC | PRN
  Administered 2019-09-28: 200 mg via INTRAVENOUS
  Administered 2019-09-28: 100 mg via INTRAVENOUS

## 2019-09-28 NOTE — Progress Notes (Signed)
PROGRESS NOTE        PATIENT DETAILS Name: Joshua [name] Age: 38 y.o. Sex: male Date of Birth: 06-18-1981 Admit Date: 09/09/2019 Admitting Physician Erline Levine, MD DVV:[id number], [name]  Brief Narrative: Patient is a 38 y.o. male with history of HTN, seizure disorder, alcohol abuse-who with seizure and right-sided hemiplegia-upon further evaluation was found to have a large left intracranial hemorrhage.  Patient was intubated-admitted to the ICU.  Evaluated by neurosurgery-underwent left frontal temporal craniectomy and evacuation of the hematoma.  Upon further improvement-transferred to the triad hospitalist service on 9/30.  Significant events: 9/12>>Admitted/  operating room for left frontal temporal craniectomy and evacuation hematoma 9/18 extubated > reintubated due to protect airway and low oxygen 9/23 > Extubated  9/30>> transferred to triad hospitalist. 10/1>> PEG tube placement by CCS  Subjective: Awake-still with dense right-sided hemiplegia.  Remains aphasic.  No other events overnight-patient is now s/p PEG tube placement earlier this morning.  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  CTA head negative for aneurysm or vascular malformation.  Remains with dense right-sided hemiplegia and aphasia.  Patient is now s/p PEG tube placement.  CIR following-we will await formal recommendations.  Aspiration pneumonia: Continue n.p.o. status-for PEG tube tomorrow.  On Keflex.  Dysphagia: Secondary to CVA-PEG tube is on 10/1-PEG tube feedings to begin later today.  AKI versus CKD stage IV: Some mild improvement in the creatinine today-but has  been persistently more than 3 since admission.  Suspect may have CKD stage III secondary to hypertensive nephrosclerosis.  UA without proteinuria.  Renal ultrasound suggestive of chronic kidney disease.  .  EtOH abuse: No obvious signs of withdrawal-on clonidine taper.  HTN: Stable-continue with Norvasc, labetalol and hydralazine.  Seizure: Continue Keppra  Nutrition Problem: Nutrition Problem: Inadequate oral intake Etiology: inability to eat Signs/Symptoms: NPO status Interventions: Refer to RD note for recommendations   Diet: Diet Order            Diet NPO time specified  Diet effective midnight               DVT Prophylaxis: Prophylactic Heparin   Code Status: Full code   Family Communication: None at bedside  Disposition Plan: Remain inpatient-?cir on discharge versus SNF on discharge.  Await further recommendations from CIR.  Antimicrobial agents: Anti-infectives (From admission, onward)   Start     Dose/Rate Route Frequency Ordered Stop   09/27/19 1130  cephALEXin (KEFLEX) 250 MG/5ML suspension 500 mg     500 mg Per Tube Every 12 hours 09/27/19 1052 09/30/19 0959   09/25/19 1445  ceFAZolin (ANCEF) IVPB 2g/100 mL premix  Status:  Discontinued     2 g 200 mL/hr over 30 Minutes Intravenous Every 12 hours 09/25/19 1417 09/27/19 1052   09/14/19 1000  Ampicillin-Sulbactam (UNASYN) 3 g in sodium chloride 0.9 % 100 mL IVPB     3 g 200 mL/hr over 30 Minutes Intravenous Every 8 hours 09/14/19 0922 09/17/19 1816   09/13/19 1100  vancomycin (VANCOCIN) IVPB 750 mg/150 ml premix  Status:  Discontinued     750 mg 150 mL/hr  over 60 Minutes Intravenous Every 24 hours 09/13/19 0728 09/13/19 0838   09/12/19 1100  vancomycin (VANCOCIN) IVPB 1000 mg/200 mL premix  Status:  Discontinued     1,000 mg 200 mL/hr over 60 Minutes Intravenous Every 24 hours 09/11/19 1034 09/13/19 0728   09/11/19 1130  piperacillin-tazobactam (ZOSYN) IVPB 3.375 g  Status:  Discontinued     3.375  g 12.5 mL/hr over 240 Minutes Intravenous Every 8 hours 09/11/19 1054 09/14/19 0845   09/11/19 1100  vancomycin (VANCOCIN) 1,500 mg in sodium chloride 0.9 % 500 mL IVPB     1,500 mg 250 mL/hr over 120 Minutes Intravenous  Once 09/11/19 1034 09/11/19 1900   09/11/19 1015  piperacillin-tazobactam (ZOSYN) IVPB 4.5 g  Status:  Discontinued     4.5 g 200 mL/hr over 30 Minutes Intravenous Every 8 hours 09/11/19 1010 09/11/19 1052   09/09/19 0845  ceFAZolin (ANCEF) IVPB 2g/100 mL premix     2 g 200 mL/hr over 30 Minutes Intravenous Every 8 hours 09/09/19 0842 09/09/19 1658      Procedures:  left frontal temporal craniectomy and evacuation hematoma>> 9/12 ETT > 9/12- 9/18 re-intubated 9/18-9/23 Right radial A-line 9/12 > 9/15 L cortrak 9/14 >> Right Brachial PICC 9/14 >> 9/19 10/1>> PEG tube placement by CCS  CONSULTS:  pulmonary/intensive care, neurology and Neurosurgery  Time spent: 25 minutes-Greater than 50% of this time was spent in counseling, explanation of diagnosis, planning of further management, and coordination of care.  MEDICATIONS: Scheduled Meds:  alteplase  2 mg Intracatheter Once   amLODipine  10 mg Per Tube Daily   cephALEXin  500 mg Per Tube Q12H   chlorhexidine gluconate (MEDLINE KIT)  15 mL Mouth Rinse BID   Chlorhexidine Gluconate Cloth  6 each Topical Daily   cloNIDine  0.1 mg Per Tube Q12H   Followed by   Derrill Memo ON 09/29/2019] cloNIDine  0.1 mg Per Tube Daily   folic acid  1 mg Per Tube Daily   free water  200 mL Per Tube Q4H   heparin injection (subcutaneous)  5,000 Units Subcutaneous Q8H   hydrALAZINE  50 mg Per Tube Q8H   insulin aspart  0-15 Units Subcutaneous Q4H   labetalol  200 mg Per Tube TID   levETIRAcetam  500 mg Per Tube BID   lipase/protease/amylase)  20,880 Units Per Tube Once   And   sodium bicarbonate  650 mg Per Tube Once   mouth rinse  15 mL Mouth Rinse 10 times per day   multivitamin  15 mL Per Tube Daily    pantoprazole sodium  40 mg Per Tube BID   QUEtiapine  50 mg Per Tube BID   sodium chloride flush  10-40 mL Intracatheter Q12H   thiamine  100 mg Per Tube Daily   Continuous Infusions:  feeding supplement (OSMOLITE 1.5 CAL) 65 mL/hr at 09/27/19 0200   PRN Meds:.acetaminophen **OR** acetaminophen, bisacodyl, hydrALAZINE, labetalol, [DISCONTINUED] ondansetron **OR** ondansetron (ZOFRAN) IV, polyethylene glycol, promethazine, sodium chloride flush   PHYSICAL EXAM: Vital signs: Vitals:   09/28/19 0815 09/28/19 0825 09/28/19 0835 09/28/19 0918  BP: (!) 127/102 (!) 139/99 (!) 155/102 (!) 144/110  Pulse: (!) 108 (!) 105 (!) 101 96  Resp: _0 Temp: 99.9 F (37.7 C)   98.5 F (36.9 C)  TempSrc: Temporal   Oral  SpO2: 100% 100% 100% 97%  Weight:      Height:       Filed Weights   09/25/19  0500 09/27/19 0500 09/28/19 0333  Weight: 59.3 kg 60.8 kg 61.3 kg   Body mass index is 19.96 kg/m.  Gen Exam:Alert-aphasic-does not communicate. HEENT:atraumatic, normocephalic Chest: B/L clear to auscultation anteriorly CVS:S1S2 regular Abdomen:soft non tender, non distended Neurology: Dense right-sided hemiplegia. Skin: no rash  I have personally reviewed following labs and imaging studies  LABORATORY DATA: CBC: Recent Labs  Lab 09/22/19 0543 09/23/19 0706 09/24/19 0557 09/28/19 0440  WBC 10.9* 9.9 8.1 10.5  HGB 8.4* 9.3* 10.2* 10.7*  HCT 27.6* 29.3* 30.8* 33.1*  MCV 94.5 91.8 90.6 91.2  PLT 399 434* 427* 432*    Basic Metabolic Panel: Recent Labs  Lab 09/22/19 0543 09/23/19 0706 09/24/19 0557 09/26/19 0801 09/28/19 0440  NA 148* 144 146* 144 144  K 4.6 4.1 4.9 4.8 5.0  CL 112* 111 111 109 110  CO2 _0 GLUCOSE 144* 121* 130* 121* 115*  BUN 52* 51* 58* 63* 58*  CREATININE 2.36* 2.27* 2.54* 2.39* 2.28*  CALCIUM 9.2 9.2 9.7 9.6 10.1  MG 2.5* 2.3  --   --   --   PHOS 6.4* 5.4* 5.8*  --   --     GFR: Estimated Creatinine Clearance: 37.7 mL/min  (A) (by C-G formula based on SCr of 2.28 mg/dL (H)).  Liver Function Tests: Recent Labs  Lab 09/24/19 0557  AST 27  ALT 29  ALKPHOS 54  BILITOT 0.3  PROT 7.5  ALBUMIN 2.8*   No results for input(s): LIPASE, AMYLASE in the last 168 hours. No results for input(s): AMMONIA in the last 168 hours.  Coagulation Profile: No results for input(s): INR, PROTIME in the last 168 hours.  Cardiac Enzymes: No results for input(s): CKTOTAL, CKMB, CKMBINDEX, TROPONINI in the last 168 hours.  BNP (last 3 results) No results for input(s): PROBNP in the last 8760 hours.  HbA1C: No results for input(s): HGBA1C in the last 72 hours.  CBG: Recent Labs  Lab 09/27/19 1953 09/27/19 2336 09/28/19 0333 09/28/19 0717 09/28/19 0921  GLUCAP 131* 124* 109* 116* 108*    Lipid Profile: No results for input(s): CHOL, HDL, LDLCALC, TRIG, CHOLHDL, LDLDIRECT in the last 72 hours.  Thyroid Function Tests: No results for input(s): TSH, T4TOTAL, FREET4, T3FREE, THYROIDAB in the last 72 hours.  Anemia Panel: No results for input(s): VITAMINB12, FOLATE, FERRITIN, TIBC, IRON, RETICCTPCT in the last 72 hours.  Urine analysis:    Component Value Date/Time   COLORURINE YELLOW (A) 09/23/2019 1108   APPEARANCEUR CLEAR (A) 09/23/2019 1108   LABSPEC 1.015 09/23/2019 1108   PHURINE 7.5 09/23/2019 1108   GLUCOSEU NEGATIVE 09/23/2019 1108   HGBUR NEGATIVE 09/23/2019 1108   [place] 09/23/2019 1108   [place] 09/23/2019 1108   PROTEINUR NEGATIVE 09/23/2019 1108   UROBILINOGEN 1.0 04/05/2009 0021   NITRITE NEGATIVE 09/23/2019 1108   LEUKOCYTESUR NEGATIVE 09/23/2019 1108    Sepsis Labs: Lactic Acid, Venous No results found for: LATICACIDVEN  MICROBIOLOGY: Recent Results (from the past 240 hour(s))  Urine Culture     Status: Abnormal   Collection Time: 09/23/19 10:01 AM   Specimen: Urine, Random  Result Value Ref Range Status   Specimen Description URINE, RANDOM  Final    Special Requests   Final    NONE Performed at [facility], [place] [address], [place], [place] [address]    Culture >=100,000 COLONIES/mL KLEBSIELLA OXYTOCA (A)  Final   Report Status 09/25/2019 FINAL  Final   Organism ID,  Bacteria KLEBSIELLA OXYTOCA (A)  Final      Susceptibility   Klebsiella oxytoca - MIC*    AMPICILLIN >=32 RESISTANT Resistant     CEFAZOLIN <=4 SENSITIVE Sensitive     CEFTRIAXONE <=1 SENSITIVE Sensitive     CIPROFLOXACIN <=0.25 SENSITIVE Sensitive     GENTAMICIN <=1 SENSITIVE Sensitive     IMIPENEM <=0.25 SENSITIVE Sensitive     NITROFURANTOIN <=16 SENSITIVE Sensitive     TRIMETH/SULFA <=20 SENSITIVE Sensitive     AMPICILLIN/SULBACTAM >=32 RESISTANT Resistant     PIP/TAZO >=128 RESISTANT Resistant     Extended ESBL NEGATIVE Sensitive     * >=100,000 COLONIES/mL KLEBSIELLA OXYTOCA    RADIOLOGY STUDIES/RESULTS: Ct Angio Head W Or Wo Contrast  Result Date: 09/10/2019 CLINICAL DATA:  Intracranial hemorrhage follow up EXAM: CT HEAD WITHOUT CONTRAST CT ANGIOGRAPHY OF THE HEAD TECHNIQUE: Contiguous axial images were obtained from the base of the skull through the vertex without intravenous contrast. Multidetector CT imaging of the head was performed using the standard protocol during bolus administration of intravenous contrast. Multiplanar CT image reconstructions and MIPs were obtained to evaluate the vascular anatomy. CONTRAST:  18m OMNIPAQUE IOHEXOL 350 MG/ML SOLN COMPARISON:  Head CT 09/09/2019 FINDINGS: CT HEAD FINDINGS Brain: Status post hematoma evacuation. Residual hematoma with interspersed postoperative gas measures 6.1 x 2.9 cm, centered in the left basal ganglia. Entrapment of the right lateral ventricle temporal horn has resolved. Volume a blood within the lateral ventricles is unchanged. Punctate focus of hemorrhage at the posterior right insula is unchanged. There is now extra-axial blood over the left convexity underlying the craniectomy site. Midline  shift has improved, now measuring 6 mm to the right. Skull: Left pterional craniectomy Sinuses/Orbits: Negative CTA HEAD FINDINGS POSTERIOR CIRCULATION: --Vertebral arteries: Normal V4 segments. --Posterior inferior cerebellar arteries (PICA): Patent origins from the vertebral arteries. --Anterior inferior cerebellar arteries (AICA): Patent origins from the basilar artery. --Basilar artery: Normal. --Superior cerebellar arteries: Normal. --Posterior cerebral arteries: Normal. Both originate from the basilar artery. Posterior communicating arteries (p-comm) are diminutive or absent. ANTERIOR CIRCULATION: --Intracranial internal carotid arteries: Normal. --Anterior cerebral arteries (ACA): Normal. Both A1 segments are present. Patent anterior communicating artery (a-comm). --Middle cerebral arteries (MCA): Normal. VENOUS SINUSES: As permitted by contrast timing, patent. ANATOMIC VARIANTS: None Review of the MIP images confirms the above findings. IMPRESSION: 1. Status post left pterional craniectomy for evacuation of left basal ganglia hematoma. Residual hematoma with interspersed postoperative gas measures 6.1 x 2.9 cm. 2. Decreased midline shift, now measuring 6 mm to the right. 3. Unchanged punctate focus of hemorrhage at the posterior right insula. 4. Unchanged intraventricular blood. 5. No intracranial arterial occlusion or high-grade stenosis. No aneurysm or vascular malformation. Electronically Signed   By: KUlyses JarredM.D.   On: 09/10/2019 05:58   Ct Head Wo Contrast  Result Date: 09/17/2019 CLINICAL DATA:  Intracranial hemorrhage follow up EXAM: CT HEAD WITHOUT CONTRAST TECHNIQUE: Contiguous axial images were obtained from the base of the skull through the vertex without intravenous contrast. COMPARISON:  Head CT 09/13/2019 FINDINGS: Brain: The size of the intraparenchymal hematoma centered in the left basal ganglia is unchanged. Surrounding edema and postsurgical changes also remain the same. There is  a small amount of blood within both occipital horns. Rightward midline shift measures 6 mm at the level of the foramina Monro, unchanged. No new site of hemorrhage. Vascular: No abnormal hyperdensity of the major intracranial arteries or dural venous sinuses. No intracranial atherosclerosis. Skull: Left pterional craniectomy with  soft tissue swelling overlying. Sinuses/Orbits: No fluid levels or advanced mucosal thickening of the visualized paranasal sinuses. No mastoid or middle ear effusion. The orbits are normal. IMPRESSION: 1. Unchanged size of intraparenchymal hematoma centered in the left basal ganglia with unchanged surrounding edema and mass effect. 2. Unchanged rightward midline shift, measuring 6 mm. Electronically Signed   By: Ulyses Jarred M.D.   On: 09/17/2019 01:46   Ct Head Wo Contrast  Result Date: 09/13/2019 CLINICAL DATA:  Stroke follow-up. EXAM: CT HEAD WITHOUT CONTRAST TECHNIQUE: Contiguous axial images were obtained from the base of the skull through the vertex without intravenous contrast. COMPARISON:  09/10/2019 and 09/09/2019 FINDINGS: Brain: Evidence of patient's recent evacuation of central left-sided hemorrhagic stroke. Residual hemorrhage over the left basal ganglia measures approximately 2.8 x 6.5 cm without significant change. There is less air within the central aspect of the hemorrhage. Stable adjacent postsurgical change, edema and minimal mass effect. Stable mild midline shift to the right of 5-6 mm. Persistent 4 mm hemorrhagic focus over the right basal ganglia is becoming less dense. Vascular: No hyperdense vessel or unexpected calcification. Skull: Evidence of recent right frontal parietal craniectomy. Skin staples over the left scalp. Sinuses/Orbits: Orbits are normal. Paranasal sinuses are well aerated. Other: Periodontal disease. IMPRESSION: Stable changes post left frontoparietal craniectomy for evacuation of left-sided central hemorrhagic stroke. Residual without  significant change measuring 2.8 x 6.5 cm with less air within the hemorrhage. Stable postsurgical change with mild edema/mass effect and stable midline shift to the right of 5-6 mm. Decrease in density 4 mm focus of hemorrhage over the right basal ganglia. Electronically Signed   By: Marin Olp M.D.   On: 09/13/2019 08:24   Ct Head Wo Contrast  Result Date: 09/10/2019 CLINICAL DATA:  Intracranial hemorrhage follow up EXAM: CT HEAD WITHOUT CONTRAST CT ANGIOGRAPHY OF THE HEAD TECHNIQUE: Contiguous axial images were obtained from the base of the skull through the vertex without intravenous contrast. Multidetector CT imaging of the head was performed using the standard protocol during bolus administration of intravenous contrast. Multiplanar CT image reconstructions and MIPs were obtained to evaluate the vascular anatomy. CONTRAST:  67m OMNIPAQUE IOHEXOL 350 MG/ML SOLN COMPARISON:  Head CT 09/09/2019 FINDINGS: CT HEAD FINDINGS Brain: Status post hematoma evacuation. Residual hematoma with interspersed postoperative gas measures 6.1 x 2.9 cm, centered in the left basal ganglia. Entrapment of the right lateral ventricle temporal horn has resolved. Volume a blood within the lateral ventricles is unchanged. Punctate focus of hemorrhage at the posterior right insula is unchanged. There is now extra-axial blood over the left convexity underlying the craniectomy site. Midline shift has improved, now measuring 6 mm to the right. Skull: Left pterional craniectomy Sinuses/Orbits: Negative CTA HEAD FINDINGS POSTERIOR CIRCULATION: --Vertebral arteries: Normal V4 segments. --Posterior inferior cerebellar arteries (PICA): Patent origins from the vertebral arteries. --Anterior inferior cerebellar arteries (AICA): Patent origins from the basilar artery. --Basilar artery: Normal. --Superior cerebellar arteries: Normal. --Posterior cerebral arteries: Normal. Both originate from the basilar artery. Posterior communicating  arteries (p-comm) are diminutive or absent. ANTERIOR CIRCULATION: --Intracranial internal carotid arteries: Normal. --Anterior cerebral arteries (ACA): Normal. Both A1 segments are present. Patent anterior communicating artery (a-comm). --Middle cerebral arteries (MCA): Normal. VENOUS SINUSES: As permitted by contrast timing, patent. ANATOMIC VARIANTS: None Review of the MIP images confirms the above findings. IMPRESSION: 1. Status post left pterional craniectomy for evacuation of left basal ganglia hematoma. Residual hematoma with interspersed postoperative gas measures 6.1 x 2.9 cm. 2. Decreased midline shift,  now measuring 6 mm to the right. 3. Unchanged punctate focus of hemorrhage at the posterior right insula. 4. Unchanged intraventricular blood. 5. No intracranial arterial occlusion or high-grade stenosis. No aneurysm or vascular malformation. Electronically Signed   By: Ulyses Jarred M.D.   On: 09/10/2019 05:58   US Renal  Result Date: 09/13/2019 CLINICAL DATA:  38 year old male with acute renal insufficiency. EXAM: RENAL / URINARY TRACT ULTRASOUND COMPLETE COMPARISON:  None. FINDINGS: Evaluation is somewhat limited due to inability of the patient to cooperate with the exam. Right Kidney: Renal measurements: 10.9 x 4.9 x 6.4 cm = volume: 179 mL. There is increased renal parenchymal echogenicity. There is mild hydronephrosis of the right kidney. No shadowing stone. Left Kidney: Renal measurements: 9.8 x 4.8 x 5.9 cm = volume: 145 mL. There is increased renal parenchymal echogenicity. Mild hydronephrosis. No shadowing stone. Bladder: The urinary bladder is distended otherwise unremarkable. Ureteral jets are not visualized. IMPRESSION: 1. Mildly echogenic kidneys likely related to underlying medical renal disease. 2. Mild bilateral hydronephrosis.  No shadowing stone. Electronically Signed   By: Anner Crete M.D.   On: 09/13/2019 11:41   Dg Chest Port 1 View  Result Date: 09/23/2019 CLINICAL DATA:   Fever EXAM: PORTABLE CHEST 1 VIEW COMPARISON:  09/18/2019 FINDINGS: There has been interval endotracheal extubation. There is no acute appearing airspace opacity. Cardiomegaly. Partially imaged enteric feeding tube. IMPRESSION: 1. There has been interval endotracheal extubation. There is no acute appearing airspace opacity. 2.  Cardiomegaly. Electronically Signed   By: Eddie Candle M.D.   On: 09/23/2019 13:18   Dg Chest Port 1 View  Result Date: 09/18/2019 CLINICAL DATA:  Intubation.  Seizure.  Head injury. EXAM: PORTABLE CHEST 1 VIEW COMPARISON:  09/17/2019. FINDINGS: Endotracheal tube, feeding tube in stable position position. PICC line tip noted over the right atrium. Stable cardiomegaly. Low lung volumes with right base atelectasis/infiltrate. No pleural effusion or pneumothorax. IMPRESSION: 1. Endotracheal tube, feeding tube in stable position. PICC line tip noted over the right atrium. 2.  Stable cardiomegaly. 3.  Low lung volumes with right base atelectasis/infiltrate. Electronically Signed   By: Marcello Moores  Register   On: 09/18/2019 06:54   Dg Chest Port 1 View  Result Date: 09/17/2019 CLINICAL DATA:  Respiratory failure. Follow-up exam. Intubated patient. EXAM: PORTABLE CHEST 1 VIEW COMPARISON:  09/16/2019 and exams. FINDINGS: Basilar atelectasis has improved.  Lungs now clear. No pneumothorax or pleural effusion. Endotracheal tube, enteric tube and left PICC are stable. IMPRESSION: 1. Lungs now clear. 2. No new abnormalities. 3. Stable support apparatus. Electronically Signed   By: Lajean Manes M.D.   On: 09/17/2019 06:35   Dg Chest Port 1 View  Result Date: 09/16/2019 CLINICAL DATA:  Acute respiratory failure.  Follow-up exam. EXAM: PORTABLE CHEST 1 VIEW COMPARISON:  09/15/2019 and earlier exams. FINDINGS: Cardiac silhouette mildly enlarged. Atelectasis noted at the right lung base on the prior study has improved. Remainder of the lungs is clear. Endotracheal tube, left-sided PICC and enteric  feeding tube are stable. IMPRESSION: 1. No acute cardiopulmonary disease. Right lung base atelectasis has improved. 2. Support apparatus is stable and well positioned. Electronically Signed   By: Lajean Manes M.D.   On: 09/16/2019 11:36   Dg Chest Port 1 View  Result Date: 09/15/2019 CLINICAL DATA:  Respiratory failure. EXAM: PORTABLE CHEST 1 VIEW COMPARISON:  Chest x-ray 09/10/2019. FINDINGS: Endotracheal tube noted with tip 3 cm above the carina. Feeding tube noted with tip below left hemidiaphragm. Left PICC  line noted with tip at cavoatrial junction. Stable cardiomegaly. Progressive right lower lobe atelectasis/infiltrate. No pleural effusion or pneumothorax. IMPRESSION: 1.  Lines and tubes in good anatomic position. 2.  Progressive right lower lobe atelectasis/infiltrate. Electronically Signed   By: Marcello Moores  Register   On: 09/15/2019 08:14   Dg Chest Port 1 View  Result Date: 09/10/2019 CLINICAL DATA:  Endotracheal tube placement. EXAM: PORTABLE CHEST 1 VIEW COMPARISON:  09/09/2019 FINDINGS: Endotracheal tube has tip 4.1 cm above the carina. Enteric tube courses into the stomach and off the film as tip is not visualized. Lungs are adequately inflated with mild stable elevation of the right hemidiaphragm. No focal lobar consolidation or effusion. Previous seen hazy density over the right midlung is improved to nearly resolved likely resolving atelectasis. Cardiomediastinal silhouette and remainder of the exam is unchanged. IMPRESSION: Resolving right midlung atelectasis. Tubes and lines as described. Electronically Signed   By: Marin Olp M.D.   On: 09/10/2019 08:41   Dg Chest Port 1 View  Result Date: 09/09/2019 CLINICAL DATA:  Respiratory failure. EXAM: PORTABLE CHEST 1 VIEW COMPARISON:  09/09/2019 FINDINGS: Endotracheal tube tip 5 cm above the carina. Enteric tube courses into the stomach and off the film as tip is not visualized. Lungs are adequately inflated with mild elevation of the right  hemidiaphragm. There is new hazy airspace opacification over the right mid to lower lung likely atelectasis. Left lung is clear. Cardiomediastinal silhouette and remainder of the exam is unchanged. IMPRESSION: New hazy opacification over the right midlung likely atelectasis. Tubes and lines as described. Electronically Signed   By: Marin Olp M.D.   On: 09/09/2019 11:22   Dg Chest Port 1 View  Result Date: 09/09/2019 CLINICAL DATA:  ET and OG placement EXAM: PORTABLE CHEST 1 VIEW COMPARISON:  None. FINDINGS: Transesophageal tube tip and side port distal to the GE junction terminating below the level of imaging. Endotracheal tube tip terminates appropriately in the mid trachea, approximately 5 cm from the carina. Additional support devices overlie the chest. No consolidation, features of edema, pneumothorax, or effusion. Pulmonary vascularity is normally distributed. The cardiomediastinal contours are unremarkable. No acute osseous or soft tissue abnormality. IMPRESSION: No acute cardiopulmonary abnormality. Appropriate positioning of the endotracheal and transesophageal tubes. Electronically Signed   By: Lovena Le M.D.   On: 09/09/2019 03:30   Dg Abd Portable 1v  Result Date: 09/21/2019 CLINICAL DATA:  Feeding tube placement EXAM: PORTABLE ABDOMEN - 1 VIEW COMPARISON:  None. FINDINGS: Feeding tube coils in the mid stomach. Nonobstructive bowel gas pattern. IMPRESSION: Feeding tube coils in the mid stomach. Electronically Signed   By: Rolm Baptise M.D.   On: 09/21/2019 21:11   Ct Head Code Stroke Wo Contrast  Result Date: 09/09/2019 CLINICAL DATA:  Code stroke.  Left-sided weakness EXAM: CT HEAD WITHOUT CONTRAST TECHNIQUE: Contiguous axial images were obtained from the base of the skull through the vertex without intravenous contrast. COMPARISON:  None. FINDINGS: Brain: There is an intraparenchymal hematoma centered in the left basal ganglia measuring 7.8 x 4.8 cm. There is rightward midline shift  that measures 1.4 cm. There is early subfalcine herniation. Hemorrhage has extended into the lateral ventricles and third ventricle. There is a small focus of intraparenchymal hemorrhage at the right basal ganglia measuring approximately 4 mm. There is right ventricular entrapment with early dilatation of the temporal horn. Basal cisterns are effaced. Vascular: No hyperdense vessel or unexpected calcification. Skull: Normal. Negative for fracture or focal lesion. Sinuses/Orbits: No acute finding.  Other: None. ASPECTS [facility] Stroke Program Early CT Score) is not applicable in the case of acute hemorrhage. IMPRESSION: 1. 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 2. Intraventricular extension of hemorrhage. Small focus of intraparenchymal hemorrhage in the right basal ganglia. Critical Value/emergent results were called by telephone at the time of interpretation on 09/09/2019 at 3:21 am to provider Dr. Kerney Elbe, Who verbally acknowledged these results. Electronically Signed   By: Ulyses Jarred M.D.   On: 09/09/2019 03:42   [place] Ekg Site Rite  Result Date: 09/11/2019 If Site Rite image not attached, placement could not be confirmed due to current cardiac rhythm.    LOS: 19 days   Oren Binet, MD  [facility]  If 7PM-7AM, please contact night-coverage  Please page via [email or web address]  Go to [email or web address] and use [place]'s universal password to access. If you do not have the password, please contact the hospital operator.  Locate the [facility] provider you are looking for under [facility] and page to a number that you can be directly reached. If you still have difficulty reaching the provider, please page the [facility] (Director on Call) for the Hospitalists listed on amion for assistance.  09/28/2019, 11:47 AM

## 2019-09-28 NOTE — Progress Notes (Signed)
STROKE TEAM PROGRESS NOTE   INTERVAL HISTORY Patient has just returned from PEG tube and is sedated.  He remains drowsy aphasic not following commands.  He has spontaneous movements on the left side    Blood pressure well controlled.  Vitals stable.  PEG tube has been inserted  OBJECTIVE Vitals:   09/28/19 0918 09/28/19 1150 09/28/19 1441 09/28/19 1452  BP: (!) 144/110 (!) 132/107 (!) 130/95 (!) 130/95  Pulse: 96 100  (!) 118  Resp: 16 20  16   Temp: 98.5 F (36.9 C) 99 F (37.2 C)  100.2 F (37.9 C)  TempSrc: Oral Oral  Axillary  SpO2: 97% 98%  98%  Weight:      Height:        CBC:  Recent Labs  Lab 09/24/19 0557 09/28/19 0440  WBC 8.1 10.5  HGB 10.2* 10.7*  HCT 30.8* 33.1*  MCV 90.6 91.2  PLT 427* 432*    Basic Metabolic Panel:  Recent Labs  Lab 09/22/19 0543 09/23/19 0706 09/24/19 0557 09/26/19 0801 09/28/19 0440  NA 148* 144 146* 144 144  K 4.6 4.1 4.9 4.8 5.0  CL 112* 111 111 109 110  CO2 26 23 23 24 24   GLUCOSE 144* 121* 130* 121* 115*  BUN 52* 51* 58* 63* 58*  CREATININE 2.36* 2.27* 2.54* 2.39* 2.28*  CALCIUM 9.2 9.2 9.7 9.6 10.1  MG 2.5* 2.3  --   --   --   PHOS 6.4* 5.4* 5.8*  --   --     IMAGING Ct Head Code Stroke Wo Contrast 09/09/2019 1. 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  2. Intraventricular extension of hemorrhage. Small focus of intraparenchymal hemorrhage in the right basal ganglia.   CT head  CTA Head  09/10/2019 1. Status post left pterional craniectomy for evacuation of left basal ganglia hematoma. Residual hematoma with interspersed postoperative gas measures 6.1 x 2.9 cm. 2. Decreased midline shift, now measuring 6 mm to the right. 3. Unchanged punctate focus of hemorrhage at the posterior right insula. 4. Unchanged intraventricular blood. 5. No intracranial arterial occlusion or high-grade stenosis. No aneurysm or  vascular malformation.   CT head  09/13/2019 Stable changes post left frontoparietal craniectomy for evacuation of left-sided central hemorrhagic stroke. Residual without significant change measuring 2.8 x 6.5 cm with less air within the hemorrhage. Stable postsurgical change with mild edema/mass effect and stable midline shift to the right of 5-6 mm. Decrease in density 4 mm focus of hemorrhage over the right basal ganglia.  CT Head W/O Contrast 09/17/19 1. Unchanged size of intraparenchymal hematoma centered in the left basal ganglia with unchanged surrounding edema and mass effect. 2. Unchanged rightward midline shift, measuring 6 mm.  Dg Chest Port 1 View 09/23/19 There has been interval endotracheal extubation. There is no acute appearing airspace opacity. Cardiomegaly. 09/18/2019 1. Endotracheal tube, feeding tube in stable position. PICC line tip noted over the right atrium. 2.  Stable cardiomegaly. 3.  Low lung volumes with right base atelectasis/infiltrate. 09/17/2019 1. Lungs now clear. 2. No new abnormalities. 3. Stable support apparatus. 09/16/2019 1. No acute cardiopulmonary disease. Right lung base atelectasis has improved. 2. Support apparatus is stable and well positioned. 09/15/2019 1.  Lines and tubes in good anatomic position. 2.  Progressive right lower lobe atelectasis/infiltrate. 09/10/2019 Resolving right midlung atelectasis. Tubes and lines as described. 09/09/2019 No acute cardiopulmonary abnormality. Appropriate positioning  of the endotracheal and transesophageal tubes.   Renal US 09/13/2019 1. Mildly echogenic kidneys likely related to underlying medical renal disease. 2. Mild bilateral hydronephrosis.  No shadowing stone.  ECG - SR rate 97 BPM. LVH and prolonged QT interval. (See cardiology reading for complete details) Transthoracic Echocardiogram  09/16/2019 1. Left ventricular ejection fraction, by visual estimation, is 70 to 75%. The left ventricle has  hyperdynamic function. Normal left ventricular size. There is severely increased left ventricular hypertrophy.  2. Global right ventricle has normal systolic function.The right ventricular size is normal. No increase in right ventricular wall thickness.  3. Left atrial size was normal.  4. Right atrial size was moderately dilated.  5. The mitral valve is grossly normal. Mild mitral valve regurgitation.  6. The tricuspid valve is grossly normal. Tricuspid valve regurgitation is trivial.  7. The aortic valve is tricuspid Aortic valve regurgitation was not visualized by color flow Doppler.  8. The pulmonic valve was grossly normal. Pulmonic valve regurgitation is not visualized by color flow Doppler.  9. The inferior vena cava was not able to be adequately assessed to estimate CVP.   PHYSICAL EXAM       General -frail [name] Dr. Willaim Rayas stroke team right for 1 week and can stop he can stop 1 of the 2 will think with if we have to have to stop we could even stop both for a few days and make sure he stable before you restart having her today the risk of a stroke versus risk of bleeding and having hemodynamic instability surface cultures dropped by 3 points probably should hold aspirin Plavix for a few days and then if you when you are attending feels it is safe to restart I will start back only 1 of them which would be coated and probably today coated aspirin at this time off precedex, lethargic but less agitated than prior notes stated.   Ophthalmologic - fundi not visualized due to noncooperation.  Cardiovascular - Regular rate and rhythm.  Neurological Exam :  -Sedated.  Eyes closed., globally aphasic.  Nonverbal, not following commands. Eyes left gaze preference  ., blinking to visual threat on the left, not tracking, pupils reactive to light. Right facial droop.  Tongue protrusion not cooperative. Spontaneous movement on the left against gravity. On pain stimulation, RUE 0/5 and  RLE slight withdraw on pain. Significantly increased muscle tone on right UE. DTR 3+ on the right and toes upgoing on the right. Sensation, coordination not cooperative and gait not tested.    ASSESSMENT/PLAN   [name] is a 38 y.o. male with history of admission for Etoh poisoning presenting with a seizure, vomiting.   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 In addition, small focus of R basal ganglia ICH.   NSG - decompressive craniectomy and hematoma evacuation w/ flap in R abd - 09/09/19 Dr Vertell Limber  Code Stroke CT Head - Large intraparenchymal hematoma of the left basal ganglia measuring 7.8 x 4.8 cm with 14 mm of rightward midline shift and early subfalcine herniation. Intraventricular extension of hemorrhage. Small focus of intraparenchymal hemorrhage in the right basal ganglia.   CT/CTA head - 09/10/2019 - Status post left pterional craniectomy for evacuation of left basal ganglia hematoma. Residual hematoma with interspersed postoperative gas measures 6.1 x 2.9 cm. Decreased midline shift, now measuring 6 mm to the right. No intracranial arterial  occlusion or high-grade stenosis. No aneurysm or vascular malformation  Ct head 9/16 and 9/20 stable crani. Stable 5-6 midline shift. Decreased R basal ganglia hemorrhage   HgbA1c 5.6  LDL 86  TTE 09/16/19 EF 70 to 75%. There is severely increased left ventricular hypertrophy.  [employer] Virus 2 - negative  UDS - negatived  VTE prophylaxis - Heparin 5000 units sq tid   No antithrombotic prior to admission, now on No antithrombotic d/t hemorrhage   Therapy recommendations: CIR recommended. They are following.  Disposition:  Pending  Awaiting bed on the floor  [facility] will take over as attending today   Cerebral Edema  S/p crani and hematoma evacuation  Treated with 3% saline protocol, now off  Na - 146  Now Free water 200  q4h   CT repeat 09/17/19 - Unchanged size of intraparenchymal hematoma and surrounding edema and mass effect.   PICC line out 9/25, for midline  At 10 days out of onset, will allow gradually normalization of Na   Respiratory Failure  Secondary to [place]  Off sedation and extubated 9/18  Re-intubated 9/18 due to low O2 sat and not protect airway  Extubated 9/23, tolerating well  Off precedex, on seroquel 50 bid, clonidine taper  CCM signed off 9/29 /20  VAP vs Aspiration PNA (vomited PTA) Leukocytosis, Febrile - resolved  WBC - 8.1  T Max - 99  U/A neg 9/14  Blood cultures normal flora  CXR progressive RLL atx/infilatrate -> CXR - 09/16/19 neg  D/c Vanc, d/c Zosyn -> Unasyn 9/17-9/20  Hypertensive Emergency  Probable undiagnosed / untreated hypertension (severe LVH on ECG)  Home BP meds: none  Current BP meds: amlodipine 10, hydralazine 50 tid, labetolol 200 tid    Off Lisinopril D/t increasing Cr  SBP goal < 160 mm Hg     Off cleviprex   BP stable   Seizure  On presentation  Hx of one seizure previously  On Keppra 500 mg Bid  AKI on CKD stage III  ACE stopped 9/14.   Renal US mild echogenic kidneys c/w CKD  Creatinine - 2.39  Continue TF and free water    Alcohol abuse  Hx ETOH abuse  recent hospitalization for alcohol poisoning.   Drinks 1 pt [place] per day.   On FA/MVI/B1  On CIWA protocol.  Hyperlipidemia  Home Lipid lowering medication: none  LDL - 86  Current lipid lowering medication: None  May consider statin at discharge  Dysphagia Malnutrition . Secondary to stroke . NPO  Has cortrak and tube feeding @ 53 on hold  Speech following - unable to clear for diet  PEG per trauma 10/1  Anemia of critical illness  Hgb 14.5->...10.2  Unclear etiology  On PPI BID  Close CBC monitoring  Other Stroke Risk Factors  Polysubstance abuse, UDS - negative 09/09/19  Other Active Problems  Hypokalemia - resolved    Mild Thrombocytopenia - resolved  Hypocalcemia - resolved   Phosphorus - 6.4->5.4->5.8  U/A and culture -> U/A - negative ; Culture > 100k Klebsiella oxytoca. ancef 9/28, enteral keflex 9/30>>.   Cardiomegaly.   Hospital day # 19 Start PEG tube feeding as per protocol..     Continue ongoing therapies.  Rehab team feels he is too poorly functioning and recommend SNF. stroke team will continue to follow along.    Antony Contras, MD  To contact Stroke Continuity provider, please refer to [email or web address] After hours, contact General Neurology

## 2019-09-28 NOTE — Transfer of Care (Signed)
Immediate Anesthesia Transfer of Care Note  Patient: Wryder Keizer  Procedure(s) Performed: ESOPHAGOGASTRODUODENOSCOPY (EGD) WITH PROPOFOL (N/A ) PERCUTANEOUS ENDOSCOPIC GASTROSTOMY (PEG) PLACEMENT (N/A )  Patient Location: Endoscopy Unit  Anesthesia Type:General  Level of Consciousness: drowsy and patient cooperative  Airway & Oxygen Therapy: Patient Spontanous Breathing and Patient connected to face mask oxygen  Post-op Assessment: Report given to RN and Post -op Vital signs reviewed and stable  Post vital signs: Reviewed and stable  Last Vitals:  Vitals Value Taken Time  BP 127/102 09/28/19 0815  Temp 37.7 C 09/28/19 0815  Pulse 105 09/28/19 0824  Resp 17 09/28/19 0824  SpO2 100 % 09/28/19 0824  Vitals shown include unvalidated device data.  Last Pain:  Vitals:   09/28/19 0815  TempSrc: Temporal  PainSc:          Complications: No apparent anesthesia complications

## 2019-09-28 NOTE — Anesthesia Procedure Notes (Signed)
Procedure Name: Intubation Performed by: Valda Favia, CRNA Pre-anesthesia Checklist: Patient identified, Emergency Drugs available, Suction available and Patient being monitored Patient Re-evaluated:Patient Re-evaluated prior to induction Oxygen Delivery Method: Circle System Utilized Preoxygenation: Pre-oxygenation with 100% oxygen Induction Type: IV induction Ventilation: Mask ventilation without difficulty Laryngoscope Size: Mac and 4 Grade View: Grade II Tube type: Oral Tube size: 7.0 mm Number of attempts: 1 Airway Equipment and Method: Stylet and Oral airway Placement Confirmation: ETT inserted through vocal cords under direct vision,  positive ETCO2 and breath sounds checked- equal and bilateral Secured at: 22 cm Tube secured with: Tape Dental Injury: Teeth and Oropharynx as per pre-operative assessment

## 2019-09-28 NOTE — Op Note (Signed)
[facility] [name] Procedure Date : 09/28/2019 MRN: [id number] Attending MD: Georganna Skeans , MD Date of Birth: 06-12-1981 CSN: [id number] Age: 38 Admit Type: Inpatient Procedure:                Upper GI endoscopy Indications:              Place PEG because patient is unable to eat due to                            stroke (CVA) Providers:                Georganna Skeans, MD, Alferd Apa, PA-C, Soyla Dryer, NP-S, Cherylynn Ridges, Technician, Lina Sar, Technician, Edmonia James, CRNA Referring MD:              Medicines:                See the Anesthesia note for documentation of the                            administered medications Complications:            No immediate complications. Estimated Blood Loss:     Estimated blood loss was minimal. Procedure:                Pre-Anesthesia Assessment:                           - Prior to the procedure, a History and Physical                            was performed, and patient medications and                            allergies were reviewed. The patient is unable to                            give consent secondary to the patient's altered                            mental status. The risks and benefits of the                            procedure and the sedation options and risks were                            discussed with the patient's mother. All questions                            were answered and informed consent was obtained.  Patient identification and proposed procedure were                            verified by the physician, the nurse, the                            anesthesiologist, the anesthetist and the                            technician in the procedure room. Mental Status                            Examination: lethargic. ASA Grade Assessment: III -                            A patient  with severe systemic disease. After                            reviewing the risks and benefits, the patient was                            deemed in satisfactory condition to undergo the                            procedure. The anesthesia plan was to use general                            anesthesia. Immediately prior to administration of                            medications, the patient was re-assessed for                            adequacy to receive sedatives. The heart rate,                            respiratory rate, oxygen saturations, blood                            pressure, adequacy of pulmonary ventilation, and                            response to care were monitored throughout the                            procedure. The physical status of the patient was                            re-assessed after the procedure.                           After obtaining informed consent, the endoscope was  passed under direct vision. Throughout the                            procedure, the patient's blood pressure, pulse, and                            oxygen saturations were monitored continuously. The                            [id number]) Olympus gastroscope was                            introduced through the mouth, and advanced to the                            second part of duodenum. The upper GI endoscopy was                            accomplished without difficulty. The patient                            tolerated the procedure well. Scope In: Scope Out: Findings:      No gross lesions were noted in the esophagus.      No gross lesions were noted in the stomach. Placement of an externally       removable PEG with no T-fasteners was successfully completed. The       external bumper was at the 3.0 cm marking on the tube. Estimated blood       loss was minimal.      No gross lesions were noted in the second portion of the  duodenum. Impression:               - No gross lesions in esophagus.                           - No gross lesions in the stomach.                           - No gross lesions in the second portion of the                            duodenum.                           - An externally removable PEG placement was                            successfully completed.                           - No specimens collected. Recommendation:           may use for Meds now, tube feeds in 4h Procedure Code(s):        --- Professional ---  [phone], Esophagogastroduodenoscopy, flexible,                            transoral; with directed placement of percutaneous                            gastrostomy tube Diagnosis Code(s):        --- Professional ---                           [id number], Other sequelae of cerebral infarction                           R63.3, Feeding difficulties                           Z43.1, Encounter for attention to gastrostomy CPT copyright 2019 American Medical Association. All rights reserved. The codes documented in this report are preliminary and upon coder review may  be revised to meet current compliance requirements. Georganna Skeans, MD 09/28/2019 7:59:15 AM This report has been signed electronically. Number of Addenda: 0

## 2019-09-28 NOTE — Anesthesia Postprocedure Evaluation (Signed)
Anesthesia Post Note  Patient: Joshua [name]  Procedure(s) Performed: ESOPHAGOGASTRODUODENOSCOPY (EGD) WITH PROPOFOL (N/A ) PERCUTANEOUS ENDOSCOPIC GASTROSTOMY (PEG) PLACEMENT (N/A )     Patient location during evaluation: PACU Anesthesia Type: General Level of consciousness: obtunded/minimal responses (Same as preop) Pain management: pain level controlled Vital Signs Assessment: post-procedure vital signs reviewed and stable Respiratory status: spontaneous breathing, nonlabored ventilation, respiratory function stable and patient connected to nasal cannula oxygen Cardiovascular status: blood pressure returned to baseline, stable and tachycardic Postop Assessment: no apparent nausea or vomiting Anesthetic complications: no    Last Vitals:  Vitals:   09/28/19 0825 09/28/19 0835  BP: (!) 139/99 (!) 155/102  Pulse: (!) 105 (!) 101  Resp: 17 18  Temp:    SpO2: 100% 100%    Last Pain:  Vitals:   09/28/19 0815  TempSrc: Temporal  PainSc:                  [name]

## 2019-09-28 NOTE — Progress Notes (Signed)
PT Cancellation Note  Patient Details Name: Joshua [name] MRN: [id number] DOB: [birth year]   Cancelled Treatment:    Reason Eval/Treat Not Completed: Patient at procedure or test/unavailable. Pt currently off unit, will check back as schedule allows to continue with PT POC.    Thelma Comp 09/28/2019, 7:23 AM   Rolinda Roan, PT, DPT Acute Rehabilitation Services Pager: [phone] Office: [phone]

## 2019-09-28 NOTE — Progress Notes (Signed)
Day of Surgery   Subjective/Chief Complaint: In Endo for PEG   Objective: Vital signs in last 24 hours: Temp:  [97.7 F (36.5 C)-99.8 F (37.7 C)] 99.7 F (37.6 C) (10/01 0707) Pulse Rate:  [79-110] 95 (10/01 0707) Resp:  [12-18] 17 (10/01 0707) BP: (112-166)/(75-124) 166/124 (10/01 0707) SpO2:  [96 %-99 %] 98 % (10/01 0707) Weight:  [61.3 kg] 61.3 kg (10/01 0333) Last BM Date: 09/23/19  Intake/Output from previous day: 09/30 0701 - 10/01 0700 In: -  Out: 1100 [Urine:1100] Intake/Output this shift: No intake/output data recorded.  Head: craniectomy Resp: clear to auscultation bilaterally Cardio: S1, S2 normal GI: soft, NT, flap on R .  Lab Results:  Recent Labs    09/28/19 0440  WBC 10.5  HGB 10.7*  HCT 33.1*  PLT 432*   BMET Recent Labs    09/26/19 0801 09/28/19 0440  NA 144 144  K 4.8 5.0  CL 109 110  CO2 24 24  GLUCOSE 121* 115*  BUN 63* 58*  CREATININE 2.39* 2.28*  CALCIUM 9.6 10.1   PT/INR No results for input(s): LABPROT, INR in the last 72 hours. ABG No results for input(s): PHART, HCO3 in the last 72 hours.  Invalid input(s): PCO2, PO2  Studies/Results: No results found.  Anti-infectives: Anti-infectives (From admission, onward)   Start     Dose/Rate Route Frequency Ordered Stop   09/27/19 1130  [MAR Hold]  cephALEXin (KEFLEX) 250 MG/5ML suspension 500 mg     (MAR Hold since Thu 09/28/2019 at 0704.Hold Reason: Transfer to a Procedural area.)   500 mg Per Tube Every 12 hours 09/27/19 1052 09/30/19 0959   09/25/19 1445  ceFAZolin (ANCEF) IVPB 2g/100 mL premix  Status:  Discontinued     2 g 200 mL/hr over 30 Minutes Intravenous Every 12 hours 09/25/19 1417 09/27/19 1052   09/14/19 1000  Ampicillin-Sulbactam (UNASYN) 3 g in sodium chloride 0.9 % 100 mL IVPB     3 g 200 mL/hr over 30 Minutes Intravenous Every 8 hours 09/14/19 0922 09/17/19 1816   09/13/19 1100  vancomycin (VANCOCIN) IVPB 750 mg/150 ml premix  Status:  Discontinued     750  mg 150 mL/hr over 60 Minutes Intravenous Every 24 hours 09/13/19 0728 09/13/19 0838   09/12/19 1100  vancomycin (VANCOCIN) IVPB 1000 mg/200 mL premix  Status:  Discontinued     1,000 mg 200 mL/hr over 60 Minutes Intravenous Every 24 hours 09/11/19 1034 09/13/19 0728   09/11/19 1130  piperacillin-tazobactam (ZOSYN) IVPB 3.375 g  Status:  Discontinued     3.375 g 12.5 mL/hr over 240 Minutes Intravenous Every 8 hours 09/11/19 1054 09/14/19 0845   09/11/19 1100  vancomycin (VANCOCIN) 1,500 mg in sodium chloride 0.9 % 500 mL IVPB     1,500 mg 250 mL/hr over 120 Minutes Intravenous  Once 09/11/19 1034 09/11/19 1900   09/11/19 1015  piperacillin-tazobactam (ZOSYN) IVPB 4.5 g  Status:  Discontinued     4.5 g 200 mL/hr over 30 Minutes Intravenous Every 8 hours 09/11/19 1010 09/11/19 1052   09/09/19 0845  ceFAZolin (ANCEF) IVPB 2g/100 mL premix     2 g 200 mL/hr over 30 Minutes Intravenous Every 8 hours 09/09/19 0842 09/09/19 1658      Assessment/Plan: For PEG. Consent by mother. On Ancef.  LOS: 19 days    Joshua [name] 09/28/2019

## 2019-09-28 NOTE — Progress Notes (Signed)
Inpatient Rehabilitation Admissions Coordinator  Patient not yet at a level to be considered for an inpt rehab admit. Recommend SNF. I will follow at a distance.  Danne Baxter, RN, MSN Rehab Admissions Coordinator [phone] 09/28/2019 12:58 PM

## 2019-09-28 NOTE — Significant Event (Signed)
Rapid Response Event Note  Overview: Respiratory   Initial Focused Assessment: Asked by nurse to assess lung sounds and airway status. Per auscultation, lung sounds - clear throughout, good air movement throughout. Noisy upper airway, + thin oral secretions. 99% on RA, HR 100-110, skin warm and dry. Neuro status unchanged per nurse, s/p PEG placement, TF started. I asked the RT to assess the patient as well. No stridor, the vibration like sound that is heard perhaps is vocalization >? + cough (spontaneous). Protecting airway currently.  Interventions: -- Deep suctioned x 2   Plan of Care: -- Monitor VS -- Aspiration Precautions -- Oral Care frequently and as needed.   Event Summary:  Start Time 1405 End Time 1435   Joshua [name] R

## 2019-09-29 ENCOUNTER — Encounter (HOSPITAL_COMMUNITY): Payer: Self-pay | Admitting: General Surgery

## 2019-09-29 LAB — BASIC METABOLIC PANEL
Anion gap: 11 (ref 5–15)
BUN: 58 mg/dL — ABNORMAL HIGH (ref 6–20)
CO2: 25 mmol/L (ref 22–32)
Calcium: 9.8 mg/dL (ref 8.9–10.3)
Chloride: 107 mmol/L (ref 98–111)
Creatinine, Ser: 2.43 mg/dL — ABNORMAL HIGH (ref 0.61–1.24)
GFR calc Af Amer: 37 mL/min — ABNORMAL LOW (ref >60)
GFR calc non Af Amer: 32 mL/min — ABNORMAL LOW (ref >60)
Glucose, Bld: 160 mg/dL — ABNORMAL HIGH (ref 70–99)
Potassium: 4.6 mmol/L (ref 3.5–5.1)
Sodium: 143 mmol/L (ref 135–145)

## 2019-09-29 LAB — GLUCOSE, CAPILLARY
Glucose-Capillary: 119 mg/dL — ABNORMAL HIGH (ref 70–99)
Glucose-Capillary: 130 mg/dL — ABNORMAL HIGH (ref 70–99)
Glucose-Capillary: 132 mg/dL — ABNORMAL HIGH (ref 70–99)
Glucose-Capillary: 144 mg/dL — ABNORMAL HIGH (ref 70–99)
Glucose-Capillary: 145 mg/dL — ABNORMAL HIGH (ref 70–99)
Glucose-Capillary: 149 mg/dL — ABNORMAL HIGH (ref 70–99)
Glucose-Capillary: 89 mg/dL (ref 70–99)

## 2019-09-29 NOTE — Progress Notes (Signed)
Physical Therapy Treatment Patient Details Name: Joshua [name] MRN: [id number] DOB: 1981-06-05 Today's Date: 09/29/2019    History of Present Illness This pt is a 38 year old with history of alcohol abuse, seizures, noncompliant hypertension who presented with new seizure right-sided hemiplegia and confusion was found to have a large left intracranial hemorrhage now s/p left frontoparietal craniectomy for evacuation of left basal ganglia hematoma on 09/09/19. Skull flap in abdomen.  Intubated 9/12 (day of admission) and extubated 09/15/19.     PT Comments    Patient received in bed, lethargic and asleep, not waking to his name being called or tactile stimulation. Performed bed mobility with MaxAx2, after which he became more alert but remained very flat and lethargic, initially required MaxA to maintain upright sitting which faded to [place] with extended time. Attempted to promote cervical extension with maxA, able to reach neutral position with therapist assist but unable to maintain independently and resisted further attempts. Tolerated sitting at EOB approximately 2-3 minutes, the was returned to bed with maxAx2 and positioned to comfort with bed alarm active, nurse tech attending this morning.     Follow Up Recommendations  CIR     Equipment Recommendations  Wheelchair (measurements PT);Wheelchair cushion (measurements PT);Hospital bed;3in1 (PT)    Recommendations for Other Services Rehab consult     Precautions / Restrictions Precautions Precautions: Fall Precaution Comments: L crani, bone flap removed and in abdomen, NG, L wrist restraint and mitt Restrictions Weight Bearing Restrictions: No    Mobility  Bed Mobility Overal bed mobility: Needs Assistance Bed Mobility: Supine to Sit;Sit to Supine     Supine to sit: Max assist;+2 for physical assistance Sit to supine: Max assist;+2 for physical assistance   General bed mobility comments: assist for trunk and LE management,  impaired sequencing and decreased strength  Transfers                 General transfer comment: did not perform today due to poor balance/posterior lean sitting at EOB  Ambulation/Gait             General Gait Details: unable at this time   Stairs             Wheelchair Mobility    Modified Rankin (Stroke Patients Only)       Balance Overall balance assessment: Needs assistance Sitting-balance support: Feet supported Sitting balance-Leahy Scale: Poor Sitting balance - Comments: Initially MaxA to maintain sitting balance, faded to [place] due to posterior lean                                    Cognition Arousal/Alertness: Lethargic Behavior During Therapy: Flat affect Overall Cognitive Status: Difficult to assess                                 General Comments: continues with no command following, very not interactive with PT but able to maintain sitting at EOB for longer today      Exercises      General Comments        Pertinent Vitals/Pain Pain Assessment: Faces Pain Score: 0-No pain Faces Pain Scale: No hurt Pain Intervention(s): Limited activity within patient's tolerance;Monitored during session    Home Living                      Prior Function  PT Goals (current goals can now be found in the care plan section) Acute Rehab PT Goals Patient Stated Goal: none stated PT Goal Formulation: Patient unable to participate in goal setting Time For Goal Achievement: 10/06/19 Potential to Achieve Goals: Fair Progress towards PT goals: Progressing toward goals    Frequency    Min 4X/week      PT Plan Current plan remains appropriate    Co-evaluation              AM-PAC PT "6 Clicks" Mobility   Outcome Measure  Help needed turning from your back to your side while in a flat bed without using bedrails?: Total Help needed moving from lying on your back to sitting on the side of  a flat bed without using bedrails?: Total Help needed moving to and from a bed to a chair (including a wheelchair)?: Total Help needed standing up from a chair using your arms (e.g., wheelchair or bedside chair)?: Total Help needed to walk in hospital room?: Total Help needed climbing 3-5 steps with a railing? : Total 6 Click Score: 6    End of Session   Activity Tolerance: Patient tolerated treatment well Patient left: in bed;with call bell/phone within reach;with bed alarm set   PT Visit Diagnosis: Muscle weakness (generalized) (M62.81);Difficulty in walking, not elsewhere classified (R26.2);Hemiplegia and hemiparesis Hemiplegia - Right/Left: Right Hemiplegia - dominant/non-dominant: Dominant Hemiplegia - caused by: Nontraumatic intracerebral hemorrhage     Time: 1135-1150 PT Time Calculation (min) (ACUTE ONLY): 15 min  Charges:  $Therapeutic Activity: 8-22 mins                     Deniece Ree PT, DPT, CBIS  Supplemental Physical Therapist [facility]    Pager [phone] Acute Rehab Office [phone]

## 2019-09-29 NOTE — Progress Notes (Signed)
  Speech Language Pathology Treatment: Dysphagia  Patient Details Name: Joshua [name] MRN: [id number] DOB: 03-09-1981 Today's Date: 09/29/2019 Time: [id number] SLP Time Calculation (min) (ACUTE ONLY): 10 min  Assessment / Plan / Recommendation Clinical Impression  Pt was seen for skilled ST targeting dysphagia goals.  Pt with nursing upon therapist's arrival, lethargic and poorly responsive to sternal rub or cold compress.  Pt did accept some oral care but kept his jaw clenched so therapist was unable to reach tongue.  Attempted to engage pt in dysphagia treatment by administering small amounts of thin liquids (water) via straw siphon; however, this failed to increase pt's alertness and trials were abandoned for pt's safety.  Pt was left in bed with bed alarm set and restraints applied.  Continue per current plan of care.    HPI HPI: Pt is a 38 year old male who presented with new seizure, and was found to have a large left BG intracranial hemorrhage s/p L frontal temporal craniectomy with evacuation 9/12. ETT 9/12-9/18; 9/18-9/23. PMH: HTN, alcoholism, noncompliance      SLP Plan  Continue with current plan of care       Recommendations  Diet recommendations: NPO Medication Administration: Via alternative means                Oral Care Recommendations: Oral care QID Follow up Recommendations: Other (comment)(pending progress) SLP Visit Diagnosis: Dysphagia, unspecified (R13.10);Cognitive communication deficit (R41.841) Plan: Continue with current plan of care       GO                PageSelinda Orion 09/29/2019, 9:26 AM

## 2019-09-29 NOTE — Progress Notes (Signed)
Subjective: Patient reports (aphasic)  Objective: Vital signs in last 24 hours: Temp:  [98.4 F (36.9 C)-100.2 F (37.9 C)] 100 F (37.8 C) (10/02 0356) Pulse Rate:  [96-118] 100 (10/02 0356) Resp:  [16-22] 20 (10/02 0356) BP: (129-155)/(85-110) 129/98 (10/02 0356) SpO2:  [97 %-100 %] 100 % (10/02 0356) Weight:  [60 kg] 60 kg (10/02 0109)  Intake/Output from previous day: 10/01 0701 - 10/02 0700 In: 510 [I.V.:510] Out: 2875 [Urine:2875] Intake/Output this shift: No intake/output data recorded.  Awake, but does not attend or follow commands. Purposeful movement left side, withdraws right side. Skull defect full, incision healed. S/p PEG yesterday, abdominal binder in place.   Lab Results: Recent Labs    09/28/19 0440  WBC 10.5  HGB 10.7*  HCT 33.1*  PLT 432*   BMET Recent Labs    09/28/19 0440 09/29/19 0436  NA 144 143  K 5.0 4.6  CL 110 107  CO2 24 25  GLUCOSE 115* 160*  BUN 58* 58*  CREATININE 2.28* 2.43*  CALCIUM 10.1 9.8    Studies/Results: Dg Chest Port 1v Same Day  Result Date: 09/28/2019 CLINICAL DATA:  Short of breath.  Code stroke EXAM: PORTABLE CHEST 1 VIEW COMPARISON:  09/23/2019 FINDINGS: Cardiac silhouette borderline enlarged. No mediastinal or hilar masses or evidence of adenopathy. Clear lungs.  No pleural effusion or pneumothorax. Skeletal structures are grossly intact. IMPRESSION: No acute cardiopulmonary disease. Electronically Signed   By: Lajean Manes M.D.   On: 09/28/2019 19:34    Assessment/Plan:   LOS: 20 days  Supportive care continues. SNF planned.    [name] 09/29/2019, 8:22 AM

## 2019-09-29 NOTE — Progress Notes (Signed)
Inpatient Rehabilitation Admissions Coordinator  Patient will need SNF level of rehab for prolonged recovery. We will sign off at this time. SW is aware.  Danne Baxter, RN, MSN Rehab Admissions Coordinator [phone] 09/29/2019 6:24 PM

## 2019-09-29 NOTE — Progress Notes (Signed)
STROKE TEAM PROGRESS NOTE   INTERVAL HISTORY Patient has just returned from PEG tube and is sedated.  He remains drowsy aphasic not following commands.  He has spontaneous movements on the left side    Blood pressure well controlled.  Vitals stable.  PEG tube has been inserted  OBJECTIVE Vitals:   09/29/19 0356 09/29/19 0824 09/29/19 0900 09/29/19 1152  BP: (!) 129/98 (!) 171/106 (!) 146/108 (!) 184/95  Pulse: 100 100  (!) 108  Resp: 20 20  20   Temp: 100 F (37.8 C) 99.5 F (37.5 C)  98 F (36.7 C)  TempSrc: Oral Oral  Oral  SpO2: 100% 100%  100%  Weight:      Height:        CBC:  Recent Labs  Lab 09/24/19 0557 09/28/19 0440  WBC 8.1 10.5  HGB 10.2* 10.7*  HCT 30.8* 33.1*  MCV 90.6 91.2  PLT 427* 432*    Basic Metabolic Panel:  Recent Labs  Lab 09/23/19 0706 09/24/19 0557  09/28/19 0440 09/29/19 0436  NA 144 146*   < > 144 143  K 4.1 4.9   < > 5.0 4.6  CL 111 111   < > 110 107  CO2 23 23   < > 24 25  GLUCOSE 121* 130*   < > 115* 160*  BUN 51* 58*   < > 58* 58*  CREATININE 2.27* 2.54*   < > 2.28* 2.43*  CALCIUM 9.2 9.7   < > 10.1 9.8  MG 2.3  --   --   --   --   PHOS 5.4* 5.8*  --   --   --    < > = values in this interval not displayed.    IMAGING Ct Head Code Stroke Wo Contrast 09/09/2019 1. 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  2. Intraventricular extension of hemorrhage. Small focus of intraparenchymal hemorrhage in the right basal ganglia.   CT head  CTA Head  09/10/2019 1. Status post left pterional craniectomy for evacuation of left basal ganglia hematoma. Residual hematoma with interspersed postoperative gas measures 6.1 x 2.9 cm. 2. Decreased midline shift, now measuring 6 mm to the right. 3. Unchanged punctate focus of hemorrhage at the posterior right insula. 4. Unchanged intraventricular blood. 5. No intracranial arterial  occlusion or high-grade stenosis. No aneurysm or vascular malformation.   CT head  09/13/2019 Stable changes post left frontoparietal craniectomy for evacuation of left-sided central hemorrhagic stroke. Residual without significant change measuring 2.8 x 6.5 cm with less air within the hemorrhage. Stable postsurgical change with mild edema/mass effect and stable midline shift to the right of 5-6 mm. Decrease in density 4 mm focus of hemorrhage over the right basal ganglia.  CT Head W/O Contrast 09/17/19 1. Unchanged size of intraparenchymal hematoma centered in the left basal ganglia with unchanged surrounding edema and mass effect. 2. Unchanged rightward midline shift, measuring 6 mm.  Dg Chest Port 1 View 09/23/19 There has been interval endotracheal extubation. There is no acute appearing airspace opacity. Cardiomegaly. 09/18/2019 1. Endotracheal tube, feeding tube in stable position. PICC line tip noted over the right atrium. 2.  Stable cardiomegaly. 3.  Low lung volumes with right base atelectasis/infiltrate. 09/17/2019 1. Lungs now clear. 2. No new abnormalities. 3. Stable support apparatus. 09/16/2019 1. No acute cardiopulmonary disease. Right lung base atelectasis has improved. 2. Support apparatus is  stable and well positioned. 09/15/2019 1.  Lines and tubes in good anatomic position. 2.  Progressive right lower lobe atelectasis/infiltrate. 09/10/2019 Resolving right midlung atelectasis. Tubes and lines as described. 09/09/2019 No acute cardiopulmonary abnormality. Appropriate positioning of the endotracheal and transesophageal tubes.   Renal US 09/13/2019 1. Mildly echogenic kidneys likely related to underlying medical renal disease. 2. Mild bilateral hydronephrosis.  No shadowing stone.  ECG - SR rate 97 BPM. LVH and prolonged QT interval. (See cardiology reading for complete details) Transthoracic Echocardiogram  09/16/2019 1. Left ventricular ejection fraction, by visual  estimation, is 70 to 75%. The left ventricle has hyperdynamic function. Normal left ventricular size. There is severely increased left ventricular hypertrophy.  2. Global right ventricle has normal systolic function.The right ventricular size is normal. No increase in right ventricular wall thickness.  3. Left atrial size was normal.  4. Right atrial size was moderately dilated.  5. The mitral valve is grossly normal. Mild mitral valve regurgitation.  6. The tricuspid valve is grossly normal. Tricuspid valve regurgitation is trivial.  7. The aortic valve is tricuspid Aortic valve regurgitation was not visualized by color flow Doppler.  8. The pulmonic valve was grossly normal. Pulmonic valve regurgitation is not visualized by color flow Doppler.  9. The inferior vena cava was not able to be adequately assessed to estimate CVP.   PHYSICAL EXAM       General -frail [name] Dr. Willaim Rayas stroke team right for 1 week and can stop he can stop 1 of the 2 will think with if we have to have to stop we could even stop both for a few days and make sure he stable before you restart having her today the risk of a stroke versus risk of bleeding and having hemodynamic instability surface cultures dropped by 3 points probably should hold aspirin Plavix for a few days and then if you when you are attending feels it is safe to restart I will start back only 1 of them which would be coated and probably today coated aspirin at this time off precedex, lethargic but less agitated than prior notes stated.   Ophthalmologic - fundi not visualized due to noncooperation.  Cardiovascular - Regular rate and rhythm.  Neurological Exam :  -Sedated.  Eyes closed., globally aphasic.  Nonverbal, not following commands. Eyes left gaze preference  ., blinking to visual threat on the left, not tracking, pupils reactive to light. Right facial droop.  Tongue protrusion not cooperative. Spontaneous movement on the left  against gravity. On pain stimulation, RUE 0/5 and RLE slight withdraw on pain. Significantly increased muscle tone on right UE. DTR 3+ on the right and toes upgoing on the right. Sensation, coordination not cooperative and gait not tested.    ASSESSMENT/PLAN   [name] is a 38 y.o. male with history of admission for Etoh poisoning presenting with a seizure, vomiting.   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 In addition, small focus of R basal ganglia ICH.   NSG - decompressive craniectomy and hematoma evacuation w/ flap in R abd - 09/09/19 Dr Vertell Limber  Code Stroke CT Head - Large intraparenchymal hematoma of the left basal ganglia measuring 7.8 x 4.8 cm with 14 mm of rightward midline shift and early subfalcine herniation. Intraventricular extension of hemorrhage. Small focus of intraparenchymal hemorrhage in the right basal ganglia.   CT/CTA head -  09/10/2019 - Status post left pterional craniectomy for evacuation of left basal ganglia hematoma. Residual hematoma with interspersed postoperative gas measures 6.1 x 2.9 cm. Decreased midline shift, now measuring 6 mm to the right. No intracranial arterial occlusion or high-grade stenosis. No aneurysm or vascular malformation  Ct head 9/16 and 9/20 stable crani. Stable 5-6 midline shift. Decreased R basal ganglia hemorrhage   HgbA1c 5.6  LDL 86  TTE 09/16/19 EF 70 to 75%. There is severely increased left ventricular hypertrophy.  [employer] Virus 2 - negative  UDS - negatived  VTE prophylaxis - Heparin 5000 units sq tid   No antithrombotic prior to admission, now on No antithrombotic d/t hemorrhage   Therapy recommendations: CIR recommended. They are following.  Disposition:  Pending  Awaiting bed on the floor  TRH will take over as attending today   Cerebral Edema  S/p crani and hematoma evacuation  Treated with 3% saline  protocol, now off  Na - 146  Now Free water 200 q4h   CT repeat 09/17/19 - Unchanged size of intraparenchymal hematoma and surrounding edema and mass effect.   PICC line out 9/25, for midline  At 10 days out of onset, will allow gradually normalization of Na   Respiratory Failure  Secondary to [place]  Off sedation and extubated 9/18  Re-intubated 9/18 due to low O2 sat and not protect airway  Extubated 9/23, tolerating well  Off precedex, on seroquel 50 bid, clonidine taper  CCM signed off 9/29 /20  VAP vs Aspiration PNA (vomited PTA) Leukocytosis, Febrile - resolved  WBC - 8.1  T Max - 99  U/A neg 9/14  Blood cultures normal flora  CXR progressive RLL atx/infilatrate -> CXR - 09/16/19 neg  D/c Vanc, d/c Zosyn -> Unasyn 9/17-9/20  Hypertensive Emergency  Probable undiagnosed / untreated hypertension (severe LVH on ECG)  Home BP meds: none  Current BP meds: amlodipine 10, hydralazine 50 tid, labetolol 200 tid    Off Lisinopril D/t increasing Cr  SBP goal < 160 mm Hg     Off cleviprex   BP stable   Seizure  On presentation  Hx of one seizure previously  On Keppra 500 mg Bid  AKI on CKD stage III  ACE stopped 9/14.   Renal US mild echogenic kidneys c/w CKD  Creatinine - 2.39  Continue TF and free water    Alcohol abuse  Hx ETOH abuse  recent hospitalization for alcohol poisoning.   Drinks 1 pt [place] per day.   On FA/MVI/B1  On CIWA protocol.  Hyperlipidemia  Home Lipid lowering medication: none  LDL - 86  Current lipid lowering medication: None  May consider statin at discharge  Dysphagia Malnutrition . Secondary to stroke . NPO  Has cortrak and tube feeding @ 53 on hold  Speech following - unable to clear for diet  PEG per trauma 10/1  Anemia of critical illness  Hgb 14.5->...10.2  Unclear etiology  On PPI BID  Close CBC monitoring  Other Stroke Risk Factors  Polysubstance abuse, UDS - negative  09/09/19  Other Active Problems  Hypokalemia - resolved   Mild Thrombocytopenia - resolved  Hypocalcemia - resolved   Phosphorus - 6.4->5.4->5.8  U/A and culture -> U/A - negative ; Culture > 100k Klebsiella oxytoca. ancef 9/28, enteral keflex 9/30>>.   Cardiomegaly.   Hospital day # 20 Continue PEG tube feeding as per protocol..     Continue ongoing therapies. Transfer when bed available to  SNF. stroke  team will continue to follow along.    Joshua Contras, MD  To contact Stroke Continuity provider, please refer to [email or web address] After hours, contact General Neurology

## 2019-09-29 NOTE — Final Consult Note (Addendum)
Consultant Final Sign-Off Note    Assessment/Final recommendations  Joshua [name] is a 38 y.o. male followed by me s/p PEG tube placement on 09/28/2019.  Recommendation: Please flush tube with 30 ml (20 ml if fluid restricted) sterile water every 4 hours, before and after medication administration, and when continuous feeding are interrupted. Continous tube feeds per primary team. Standing order for unclogging feeding tube in place if this were to occur. Please keep abdominal binder in place. Thank you for allowing [place] to participate in the care of your patient!  Please consult [place] again if you have further needs for your patient.  Barth Kirks [facility] 09/29/2019 9:16 AM   Subjective   No complaints. TF's have been restarted  Objective  Vital signs in last 24 hours: Temp:  [98.4 F (36.9 C)-100.2 F (37.9 C)] 99.5 F (37.5 C) (10/02 0824) Pulse Rate:  [96-118] 100 (10/02 0824) Resp:  [16-22] 20 (10/02 0824) BP: (129-171)/(85-110) 146/108 (10/02 0900) SpO2:  [97 %-100 %] 100 % (10/02 0824) Weight:  [60 kg] 60 kg (10/02 0109)  Gen: Awake but does not follow commands Lungs: Normal rate and effort Abd: Soft beside area where skull flap is present underneath skin, ND, no rigidity or gaurding, incision c/d/i. Abdominal binder in place. PEG tube in place and appears clean and dry without signs of leakage.  Pertinent labs and Studies: Recent Labs    09/28/19 0440  WBC 10.5  HGB 10.7*  HCT 33.1*   BMET Recent Labs    09/28/19 0440 09/29/19 0436  NA 144 143  K 5.0 4.6  CL 110 107  CO2 24 25  GLUCOSE 115* 160*  BUN 58* 58*  CREATININE 2.28* 2.43*  CALCIUM 10.1 9.8   No results for input(s): LABURIN in the last 72 hours. Results for orders placed or performed during the hospital encounter of 09/09/19  SARS Coronavirus 2 [facility] order, Performed in [facility] hospital lab) Nasopharyngeal Nasopharyngeal Swab     Status: None   Collection Time: 09/09/19  3:47 AM   Specimen:  Nasopharyngeal Swab  Result Value Ref Range Status   SARS Coronavirus 2 NEGATIVE NEGATIVE Final    Comment: (NOTE) If result is NEGATIVE SARS-CoV-2 target nucleic acids are NOT DETECTED. The SARS-CoV-2 RNA is generally detectable in upper and lower  respiratory specimens during the acute phase of infection. The lowest  concentration of SARS-CoV-2 viral copies this assay can detect is 250  copies / mL. A negative result does not preclude SARS-CoV-2 infection  and should not be used as the sole basis for treatment or other  patient management decisions.  A negative result may occur with  improper specimen collection / handling, submission of specimen other  than nasopharyngeal swab, presence of viral mutation(s) within the  areas targeted by this assay, and inadequate number of viral copies  (<250 copies / mL). A negative result must be combined with clinical  observations, patient history, and epidemiological information. If result is POSITIVE SARS-CoV-2 target nucleic acids are DETECTED. The SARS-CoV-2 RNA is generally detectable in upper and lower  respiratory specimens dur ing the acute phase of infection.  Positive  results are indicative of active infection with SARS-CoV-2.  Clinical  correlation with patient history and other diagnostic information is  necessary to determine patient infection status.  Positive results do  not rule out bacterial infection or co-infection with other viruses. If result is PRESUMPTIVE POSTIVE SARS-CoV-2 nucleic acids MAY BE PRESENT.   A presumptive positive result was  obtained on the submitted specimen  and confirmed on repeat testing.  While 2019 novel coronavirus  (SARS-CoV-2) nucleic acids may be present in the submitted sample  additional confirmatory testing may be necessary for epidemiological  and / or clinical management purposes  to differentiate between  SARS-CoV-2 and other Sarbecovirus currently known to infect humans.  If clinically  indicated additional testing with an alternate test  methodology [phone]) is advised. The SARS-CoV-2 RNA is generally  detectable in upper and lower respiratory sp ecimens during the acute  phase of infection. The expected result is Negative. Fact Sheet for Patients:  StrictlyIdeas.no Fact Sheet for Healthcare Providers: BankingDealers.co.za This test is not yet approved or cleared by the [place] FDA and has been authorized for detection and/or diagnosis of SARS-CoV-2 by FDA under an Emergency Use Authorization (EUA).  This EUA will remain in effect (meaning this test can be used) for the duration of the COVID-19 declaration under Section 564(b)(1) of the Act, 21 U.S.C. section 360bbb-3(b)(1), unless the authorization is terminated or revoked sooner. Performed at [facility], [place] [address] [place], [place] [address]   MRSA PCR Screening     Status: None   Collection Time: 09/09/19  8:04 AM   Specimen: Nasal Mucosa; Nasopharyngeal  Result Value Ref Range Status   MRSA by PCR NEGATIVE NEGATIVE Final    Comment:        The GeneXpert MRSA Assay (FDA approved for NASAL specimens only), is one component of a comprehensive MRSA colonization surveillance program. It is not intended to diagnose MRSA infection nor to guide or monitor treatment for MRSA infections. Performed at [facility], [place] [address] [place], [place] [address]   Culture, respiratory (non-expectorated)     Status: None   Collection Time: 09/09/19  8:55 AM   Specimen: Tracheal Aspirate; Respiratory  Result Value Ref Range Status   Specimen Description TRACHEAL ASPIRATE  Final   Special Requests NONE  Final   Gram Stain   Final    MODERATE WBC PRESENT, PREDOMINANTLY PMN NO ORGANISMS SEEN    Culture   Final    RARE Consistent with normal respiratory flora. Performed at [facility], [place] [address], [place], [place] [address]    Report  Status 09/12/2019 FINAL  Final  Culture, respiratory (non-expectorated)     Status: None   Collection Time: 09/11/19 12:23 PM   Specimen: Tracheal Aspirate; Respiratory  Result Value Ref Range Status   Specimen Description TRACHEAL ASPIRATE  Final   Special Requests NONE  Final   Gram Stain   Final    MODERATE WBC PRESENT, PREDOMINANTLY PMN ABUNDANT GRAM NEGATIVE RODS FEW GRAM POSITIVE COCCI    Culture   Final    FEW Consistent with normal respiratory flora. Performed at [facility], [place] [address], [place], [place] [address]    Report Status 09/13/2019 FINAL  Final  Urine Culture     Status: Abnormal   Collection Time: 09/23/19 10:01 AM   Specimen: Urine, Random  Result Value Ref Range Status   Specimen Description URINE, RANDOM  Final   Special Requests   Final    NONE Performed at [facility], [place] [address] [place], [place] [address]    Culture >=100,000 COLONIES/mL KLEBSIELLA OXYTOCA (A)  Final   Report Status 09/25/2019 FINAL  Final   Organism ID, Bacteria KLEBSIELLA OXYTOCA (A)  Final      Susceptibility   Klebsiella oxytoca -  MIC*    AMPICILLIN >=32 RESISTANT Resistant     CEFAZOLIN <=4 SENSITIVE Sensitive     CEFTRIAXONE <=1 SENSITIVE Sensitive     CIPROFLOXACIN <=0.25 SENSITIVE Sensitive     GENTAMICIN <=1 SENSITIVE Sensitive     IMIPENEM <=0.25 SENSITIVE Sensitive     NITROFURANTOIN <=16 SENSITIVE Sensitive     TRIMETH/SULFA <=20 SENSITIVE Sensitive     AMPICILLIN/SULBACTAM >=32 RESISTANT Resistant     PIP/TAZO >=128 RESISTANT Resistant     Extended ESBL NEGATIVE Sensitive     * >=100,000 COLONIES/mL KLEBSIELLA OXYTOCA    Imaging: Dg Chest Port 1v Same Day  Result Date: 09/28/2019 CLINICAL DATA:  Short of breath.  Code stroke EXAM: PORTABLE CHEST 1 VIEW COMPARISON:  09/23/2019 FINDINGS: Cardiac silhouette borderline enlarged. No mediastinal or hilar masses or evidence of adenopathy. Clear lungs.  No pleural effusion or pneumothorax. Skeletal  structures are grossly intact. IMPRESSION: No acute cardiopulmonary disease. Electronically Signed   By: Lajean Manes M.D.   On: 09/28/2019 19:34

## 2019-09-29 NOTE — Progress Notes (Signed)
PROGRESS NOTE        PATIENT DETAILS Name: Joshua [name] Age: 38 y.o. Sex: male Date of Birth: 09/08/1981 Admit Date: 09/09/2019 Admitting Physician Erline Levine, MD GUR:[id number], [name]  Brief Narrative: Patient is a 38 y.o. male with history of HTN, seizure disorder, alcohol abuse-who with seizure and right-sided hemiplegia-upon further evaluation was found to have a large left intracranial hemorrhage.  Patient was intubated-admitted to the ICU.  Evaluated by neurosurgery-underwent left frontal temporal craniectomy and evacuation of the hematoma.  Upon further improvement-transferred to the triad hospitalist service on 9/30.  Significant events: 9/12>>Admitted/  operating room for left frontal temporal craniectomy and evacuation hematoma 9/18 extubated > reintubated due to protect airway and low oxygen 9/23 > Extubated  9/30>> transferred to triad hospitalist. 10/1>> PEG tube placement by CCS  Subjective: Remains aphasic-opens eyes to loud verbal stimuli.  Really does not do anything else.  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  CTA head negative for aneurysm or vascular malformation.  Remains with dense right-sided hemiplegia and aphasia.  Patient is now s/p PEG tube placement.  CIR following-we will await formal recommendations.  Aspiration pneumonia: Continue n.p.o. status-now on tube feedings-on Keflex.  Remains at risk for aspiration-on Robinul-have asked RN to raise head and to at least 60-90 degree angle, and to suction secretions frequently.  Dysphagia: Secondary to CVA-underwent PEG tube placement on 10/1-tolerating PEG tube  feedings.  AKI versus CKD stage IV: Suspect has underlying CKD stage III-probably from hypertensive nephrosclerosis.  Continue to follow electrolytes closely.  UA without proteinuria.  Renal ultrasound suggestive of chronic kidney disease.  .  EtOH abuse: No obvious signs of withdrawal-on clonidine taper.  HTN: Stable-continue with Norvasc, labetalol and hydralazine.  Will follow and adjust accordingly.  Seizure: Continue Keppra  Nutrition Problem: Nutrition Problem: Inadequate oral intake Etiology: inability to eat Signs/Symptoms: NPO status Interventions: Refer to RD note for recommendations   Diet: Diet Order            Diet NPO time specified  Diet effective midnight               DVT Prophylaxis: Prophylactic Heparin   Code Status: Full code   Family Communication: None at bedside  Disposition Plan: Remain inpatient-?cir on discharge versus SNF on discharge.  Await further recommendations from CIR.  Antimicrobial agents: Anti-infectives (From admission, onward)   Start     Dose/Rate Route Frequency Ordered Stop   09/27/19 1130  cephALEXin (KEFLEX) 250 MG/5ML suspension 500 mg     500 mg Per Tube Every 12 hours 09/27/19 1052 09/30/19 0959   09/25/19 1445  ceFAZolin (ANCEF) IVPB 2g/100 mL premix  Status:  Discontinued     2 g 200 mL/hr over 30 Minutes Intravenous Every 12 hours 09/25/19 1417 09/27/19 1052   09/14/19 1000  Ampicillin-Sulbactam (UNASYN) 3 g in sodium chloride 0.9 % 100 mL IVPB     3 g 200 mL/hr over 30 Minutes Intravenous Every 8 hours 09/14/19 0922 09/17/19 1816   09/13/19 1100  vancomycin (VANCOCIN) IVPB 750 mg/150 ml premix  Status:  Discontinued  750 mg 150 mL/hr over 60 Minutes Intravenous Every 24 hours 09/13/19 0728 09/13/19 0838   09/12/19 1100  vancomycin (VANCOCIN) IVPB 1000 mg/200 mL premix  Status:  Discontinued     1,000 mg 200 mL/hr over 60 Minutes Intravenous Every 24 hours 09/11/19 1034 09/13/19 0728   09/11/19 1130   piperacillin-tazobactam (ZOSYN) IVPB 3.375 g  Status:  Discontinued     3.375 g 12.5 mL/hr over 240 Minutes Intravenous Every 8 hours 09/11/19 1054 09/14/19 0845   09/11/19 1100  vancomycin (VANCOCIN) 1,500 mg in sodium chloride 0.9 % 500 mL IVPB     1,500 mg 250 mL/hr over 120 Minutes Intravenous  Once 09/11/19 1034 09/11/19 1900   09/11/19 1015  piperacillin-tazobactam (ZOSYN) IVPB 4.5 g  Status:  Discontinued     4.5 g 200 mL/hr over 30 Minutes Intravenous Every 8 hours 09/11/19 1010 09/11/19 1052   09/09/19 0845  ceFAZolin (ANCEF) IVPB 2g/100 mL premix     2 g 200 mL/hr over 30 Minutes Intravenous Every 8 hours 09/09/19 0842 09/09/19 1658      Procedures:  left frontal temporal craniectomy and evacuation hematoma>> 9/12 ETT > 9/12- 9/18 re-intubated 9/18-9/23 Right radial A-line 9/12 > 9/15 L cortrak 9/14 >> Right Brachial PICC 9/14 >> 9/19 10/1>> PEG tube placement by CCS  CONSULTS:  pulmonary/intensive care, neurology and Neurosurgery  Time spent: 25 minutes-Greater than 50% of this time was spent in counseling, explanation of diagnosis, planning of further management, and coordination of care.  MEDICATIONS: Scheduled Meds:  alteplase  2 mg Intracatheter Once   amLODipine  10 mg Per Tube Daily   cephALEXin  500 mg Per Tube Q12H   chlorhexidine gluconate (MEDLINE KIT)  15 mL Mouth Rinse BID   Chlorhexidine Gluconate Cloth  6 each Topical Daily   folic acid  1 mg Per Tube Daily   free water  200 mL Per Tube Q4H   glycopyrrolate  0.1 mg Intravenous TID   heparin injection (subcutaneous)  5,000 Units Subcutaneous Q8H   hydrALAZINE  50 mg Per Tube Q8H   insulin aspart  0-15 Units Subcutaneous Q4H   labetalol  200 mg Per Tube TID   levETIRAcetam  500 mg Per Tube BID   lipase/protease/amylase)  20,880 Units Per Tube Once   And   sodium bicarbonate  650 mg Per Tube Once   mouth rinse  15 mL Mouth Rinse 10 times per day   multivitamin  15 mL Per Tube  Daily   pantoprazole sodium  40 mg Per Tube BID   QUEtiapine  50 mg Per Tube BID   sodium chloride flush  10-40 mL Intracatheter Q12H   thiamine  100 mg Per Tube Daily   Continuous Infusions:  feeding supplement (OSMOLITE 1.5 CAL) 65 mL/hr at 09/27/19 0200   PRN Meds:.acetaminophen **OR** acetaminophen, bisacodyl, hydrALAZINE, labetalol, [DISCONTINUED] ondansetron **OR** ondansetron (ZOFRAN) IV, polyethylene glycol, promethazine, sodium chloride flush, traMADol   PHYSICAL EXAM: Vital signs: Vitals:   09/29/19 0356 09/29/19 0824 09/29/19 0900 09/29/19 1152  BP: (!) 129/98 (!) 171/106 (!) 146/108 (!) 184/95  Pulse: 100 100  (!) 108  Resp: _0 Temp: 100 F (37.8 C) 99.5 F (37.5 C)  98 F (36.7 C)  TempSrc: Oral Oral  Oral  SpO2: 100% 100%  100%  Weight:      Height:       Filed Weights   09/27/19 0500 09/28/19 0333 09/29/19 0109  Weight: 60.8 kg 61.3 kg  60 kg   Body mass index is 19.53 kg/m.   Gen Exam: Finds eyes to loud verbal stimuli-but is aphasic. Chest: B/L clear to auscultation anteriorly CVS:S1S2 regular Abdomen:soft non tender, non distended Extremities:no edema Neurology: Dense right-sided hemiplegia Skin: no rash  I have personally reviewed following labs and imaging studies  LABORATORY DATA: CBC: Recent Labs  Lab 09/23/19 0706 09/24/19 0557 09/28/19 0440  WBC 9.9 8.1 10.5  HGB 9.3* 10.2* 10.7*  HCT 29.3* 30.8* 33.1*  MCV 91.8 90.6 91.2  PLT 434* 427* 432*    Basic Metabolic Panel: Recent Labs  Lab 09/23/19 0706 09/24/19 0557 09/26/19 0801 09/28/19 0440 09/29/19 0436  NA 144 146* 144 144 143  K 4.1 4.9 4.8 5.0 4.6  CL 111 111 109 110 107  CO2 _0 GLUCOSE 121* 130* 121* 115* 160*  BUN 51* 58* 63* 58* 58*  CREATININE 2.27* 2.54* 2.39* 2.28* 2.43*  CALCIUM 9.2 9.7 9.6 10.1 9.8  MG 2.3  --   --   --   --   PHOS 5.4* 5.8*  --   --   --     GFR: Estimated Creatinine Clearance: 34.6 mL/min (A) (by C-G formula  based on SCr of 2.43 mg/dL (H)).  Liver Function Tests: Recent Labs  Lab 09/24/19 0557  AST 27  ALT 29  ALKPHOS 54  BILITOT 0.3  PROT 7.5  ALBUMIN 2.8*   No results for input(s): LIPASE, AMYLASE in the last 168 hours. No results for input(s): AMMONIA in the last 168 hours.  Coagulation Profile: No results for input(s): INR, PROTIME in the last 168 hours.  Cardiac Enzymes: No results for input(s): CKTOTAL, CKMB, CKMBINDEX, TROPONINI in the last 168 hours.  BNP (last 3 results) No results for input(s): PROBNP in the last 8760 hours.  HbA1C: No results for input(s): HGBA1C in the last 72 hours.  CBG: Recent Labs  Lab 09/28/19 2047 09/29/19 0040 09/29/19 0353 09/29/19 0821 09/29/19 1149  GLUCAP 89 145* 132* 149* 130*    Lipid Profile: No results for input(s): CHOL, HDL, LDLCALC, TRIG, CHOLHDL, LDLDIRECT in the last 72 hours.  Thyroid Function Tests: No results for input(s): TSH, T4TOTAL, FREET4, T3FREE, THYROIDAB in the last 72 hours.  Anemia Panel: No results for input(s): VITAMINB12, FOLATE, FERRITIN, TIBC, IRON, RETICCTPCT in the last 72 hours.  Urine analysis:    Component Value Date/Time   COLORURINE YELLOW (A) 09/23/2019 1108   APPEARANCEUR CLEAR (A) 09/23/2019 1108   LABSPEC 1.015 09/23/2019 1108   PHURINE 7.5 09/23/2019 1108   GLUCOSEU NEGATIVE 09/23/2019 1108   HGBUR NEGATIVE 09/23/2019 1108   [place] 09/23/2019 1108   [place] 09/23/2019 1108   PROTEINUR NEGATIVE 09/23/2019 1108   UROBILINOGEN 1.0 04/05/2009 0021   NITRITE NEGATIVE 09/23/2019 1108   LEUKOCYTESUR NEGATIVE 09/23/2019 1108    Sepsis Labs: Lactic Acid, Venous No results found for: LATICACIDVEN  MICROBIOLOGY: Recent Results (from the past 240 hour(s))  Urine Culture     Status: Abnormal   Collection Time: 09/23/19 10:01 AM   Specimen: Urine, Random  Result Value Ref Range Status   Specimen Description URINE, RANDOM  Final   Special Requests   Final     NONE Performed at [facility], [place] [address] [place], [place] [address]    Culture >=100,000 COLONIES/mL KLEBSIELLA OXYTOCA (A)  Final   Report Status 09/25/2019 FINAL  Final   Organism ID, Bacteria KLEBSIELLA OXYTOCA (A)  Final  Susceptibility   Klebsiella oxytoca - MIC*    AMPICILLIN >=32 RESISTANT Resistant     CEFAZOLIN <=4 SENSITIVE Sensitive     CEFTRIAXONE <=1 SENSITIVE Sensitive     CIPROFLOXACIN <=0.25 SENSITIVE Sensitive     GENTAMICIN <=1 SENSITIVE Sensitive     IMIPENEM <=0.25 SENSITIVE Sensitive     NITROFURANTOIN <=16 SENSITIVE Sensitive     TRIMETH/SULFA <=20 SENSITIVE Sensitive     AMPICILLIN/SULBACTAM >=32 RESISTANT Resistant     PIP/TAZO >=128 RESISTANT Resistant     Extended ESBL NEGATIVE Sensitive     * >=100,000 COLONIES/mL KLEBSIELLA OXYTOCA    RADIOLOGY STUDIES/RESULTS: Ct Angio Head W Or Wo Contrast  Result Date: 09/10/2019 CLINICAL DATA:  Intracranial hemorrhage follow up EXAM: CT HEAD WITHOUT CONTRAST CT ANGIOGRAPHY OF THE HEAD TECHNIQUE: Contiguous axial images were obtained from the base of the skull through the vertex without intravenous contrast. Multidetector CT imaging of the head was performed using the standard protocol during bolus administration of intravenous contrast. Multiplanar CT image reconstructions and MIPs were obtained to evaluate the vascular anatomy. CONTRAST:  56m OMNIPAQUE IOHEXOL 350 MG/ML SOLN COMPARISON:  Head CT 09/09/2019 FINDINGS: CT HEAD FINDINGS Brain: Status post hematoma evacuation. Residual hematoma with interspersed postoperative gas measures 6.1 x 2.9 cm, centered in the left basal ganglia. Entrapment of the right lateral ventricle temporal horn has resolved. Volume a blood within the lateral ventricles is unchanged. Punctate focus of hemorrhage at the posterior right insula is unchanged. There is now extra-axial blood over the left convexity underlying the craniectomy site. Midline shift has improved, now  measuring 6 mm to the right. Skull: Left pterional craniectomy Sinuses/Orbits: Negative CTA HEAD FINDINGS POSTERIOR CIRCULATION: --Vertebral arteries: Normal V4 segments. --Posterior inferior cerebellar arteries (PICA): Patent origins from the vertebral arteries. --Anterior inferior cerebellar arteries (AICA): Patent origins from the basilar artery. --Basilar artery: Normal. --Superior cerebellar arteries: Normal. --Posterior cerebral arteries: Normal. Both originate from the basilar artery. Posterior communicating arteries (p-comm) are diminutive or absent. ANTERIOR CIRCULATION: --Intracranial internal carotid arteries: Normal. --Anterior cerebral arteries (ACA): Normal. Both A1 segments are present. Patent anterior communicating artery (a-comm). --Middle cerebral arteries (MCA): Normal. VENOUS SINUSES: As permitted by contrast timing, patent. ANATOMIC VARIANTS: None Review of the MIP images confirms the above findings. IMPRESSION: 1. Status post left pterional craniectomy for evacuation of left basal ganglia hematoma. Residual hematoma with interspersed postoperative gas measures 6.1 x 2.9 cm. 2. Decreased midline shift, now measuring 6 mm to the right. 3. Unchanged punctate focus of hemorrhage at the posterior right insula. 4. Unchanged intraventricular blood. 5. No intracranial arterial occlusion or high-grade stenosis. No aneurysm or vascular malformation. Electronically Signed   By: KUlyses JarredM.D.   On: 09/10/2019 05:58   Ct Head Wo Contrast  Result Date: 09/17/2019 CLINICAL DATA:  Intracranial hemorrhage follow up EXAM: CT HEAD WITHOUT CONTRAST TECHNIQUE: Contiguous axial images were obtained from the base of the skull through the vertex without intravenous contrast. COMPARISON:  Head CT 09/13/2019 FINDINGS: Brain: The size of the intraparenchymal hematoma centered in the left basal ganglia is unchanged. Surrounding edema and postsurgical changes also remain the same. There is a small amount of blood  within both occipital horns. Rightward midline shift measures 6 mm at the level of the foramina Monro, unchanged. No new site of hemorrhage. Vascular: No abnormal hyperdensity of the major intracranial arteries or dural venous sinuses. No intracranial atherosclerosis. Skull: Left pterional craniectomy with soft tissue swelling overlying. Sinuses/Orbits: No fluid levels or advanced mucosal  thickening of the visualized paranasal sinuses. No mastoid or middle ear effusion. The orbits are normal. IMPRESSION: 1. Unchanged size of intraparenchymal hematoma centered in the left basal ganglia with unchanged surrounding edema and mass effect. 2. Unchanged rightward midline shift, measuring 6 mm. Electronically Signed   By: Ulyses Jarred M.D.   On: 09/17/2019 01:46   Ct Head Wo Contrast  Result Date: 09/13/2019 CLINICAL DATA:  Stroke follow-up. EXAM: CT HEAD WITHOUT CONTRAST TECHNIQUE: Contiguous axial images were obtained from the base of the skull through the vertex without intravenous contrast. COMPARISON:  09/10/2019 and 09/09/2019 FINDINGS: Brain: Evidence of patient's recent evacuation of central left-sided hemorrhagic stroke. Residual hemorrhage over the left basal ganglia measures approximately 2.8 x 6.5 cm without significant change. There is less air within the central aspect of the hemorrhage. Stable adjacent postsurgical change, edema and minimal mass effect. Stable mild midline shift to the right of 5-6 mm. Persistent 4 mm hemorrhagic focus over the right basal ganglia is becoming less dense. Vascular: No hyperdense vessel or unexpected calcification. Skull: Evidence of recent right frontal parietal craniectomy. Skin staples over the left scalp. Sinuses/Orbits: Orbits are normal. Paranasal sinuses are well aerated. Other: Periodontal disease. IMPRESSION: Stable changes post left frontoparietal craniectomy for evacuation of left-sided central hemorrhagic stroke. Residual without significant change measuring  2.8 x 6.5 cm with less air within the hemorrhage. Stable postsurgical change with mild edema/mass effect and stable midline shift to the right of 5-6 mm. Decrease in density 4 mm focus of hemorrhage over the right basal ganglia. Electronically Signed   By: Marin Olp M.D.   On: 09/13/2019 08:24   Ct Head Wo Contrast  Result Date: 09/10/2019 CLINICAL DATA:  Intracranial hemorrhage follow up EXAM: CT HEAD WITHOUT CONTRAST CT ANGIOGRAPHY OF THE HEAD TECHNIQUE: Contiguous axial images were obtained from the base of the skull through the vertex without intravenous contrast. Multidetector CT imaging of the head was performed using the standard protocol during bolus administration of intravenous contrast. Multiplanar CT image reconstructions and MIPs were obtained to evaluate the vascular anatomy. CONTRAST:  83m OMNIPAQUE IOHEXOL 350 MG/ML SOLN COMPARISON:  Head CT 09/09/2019 FINDINGS: CT HEAD FINDINGS Brain: Status post hematoma evacuation. Residual hematoma with interspersed postoperative gas measures 6.1 x 2.9 cm, centered in the left basal ganglia. Entrapment of the right lateral ventricle temporal horn has resolved. Volume a blood within the lateral ventricles is unchanged. Punctate focus of hemorrhage at the posterior right insula is unchanged. There is now extra-axial blood over the left convexity underlying the craniectomy site. Midline shift has improved, now measuring 6 mm to the right. Skull: Left pterional craniectomy Sinuses/Orbits: Negative CTA HEAD FINDINGS POSTERIOR CIRCULATION: --Vertebral arteries: Normal V4 segments. --Posterior inferior cerebellar arteries (PICA): Patent origins from the vertebral arteries. --Anterior inferior cerebellar arteries (AICA): Patent origins from the basilar artery. --Basilar artery: Normal. --Superior cerebellar arteries: Normal. --Posterior cerebral arteries: Normal. Both originate from the basilar artery. Posterior communicating arteries (p-comm) are diminutive or  absent. ANTERIOR CIRCULATION: --Intracranial internal carotid arteries: Normal. --Anterior cerebral arteries (ACA): Normal. Both A1 segments are present. Patent anterior communicating artery (a-comm). --Middle cerebral arteries (MCA): Normal. VENOUS SINUSES: As permitted by contrast timing, patent. ANATOMIC VARIANTS: None Review of the MIP images confirms the above findings. IMPRESSION: 1. Status post left pterional craniectomy for evacuation of left basal ganglia hematoma. Residual hematoma with interspersed postoperative gas measures 6.1 x 2.9 cm. 2. Decreased midline shift, now measuring 6 mm to the right. 3. Unchanged punctate focus  of hemorrhage at the posterior right insula. 4. Unchanged intraventricular blood. 5. No intracranial arterial occlusion or high-grade stenosis. No aneurysm or vascular malformation. Electronically Signed   By: Ulyses Jarred M.D.   On: 09/10/2019 05:58   US Renal  Result Date: 09/13/2019 CLINICAL DATA:  38 year old male with acute renal insufficiency. EXAM: RENAL / URINARY TRACT ULTRASOUND COMPLETE COMPARISON:  None. FINDINGS: Evaluation is somewhat limited due to inability of the patient to cooperate with the exam. Right Kidney: Renal measurements: 10.9 x 4.9 x 6.4 cm = volume: 179 mL. There is increased renal parenchymal echogenicity. There is mild hydronephrosis of the right kidney. No shadowing stone. Left Kidney: Renal measurements: 9.8 x 4.8 x 5.9 cm = volume: 145 mL. There is increased renal parenchymal echogenicity. Mild hydronephrosis. No shadowing stone. Bladder: The urinary bladder is distended otherwise unremarkable. Ureteral jets are not visualized. IMPRESSION: 1. Mildly echogenic kidneys likely related to underlying medical renal disease. 2. Mild bilateral hydronephrosis.  No shadowing stone. Electronically Signed   By: Anner Crete M.D.   On: 09/13/2019 11:41   Dg Chest Port 1 View  Result Date: 09/23/2019 CLINICAL DATA:  Fever EXAM: PORTABLE CHEST 1 VIEW  COMPARISON:  09/18/2019 FINDINGS: There has been interval endotracheal extubation. There is no acute appearing airspace opacity. Cardiomegaly. Partially imaged enteric feeding tube. IMPRESSION: 1. There has been interval endotracheal extubation. There is no acute appearing airspace opacity. 2.  Cardiomegaly. Electronically Signed   By: Eddie Candle M.D.   On: 09/23/2019 13:18   Dg Chest Port 1 View  Result Date: 09/18/2019 CLINICAL DATA:  Intubation.  Seizure.  Head injury. EXAM: PORTABLE CHEST 1 VIEW COMPARISON:  09/17/2019. FINDINGS: Endotracheal tube, feeding tube in stable position position. PICC line tip noted over the right atrium. Stable cardiomegaly. Low lung volumes with right base atelectasis/infiltrate. No pleural effusion or pneumothorax. IMPRESSION: 1. Endotracheal tube, feeding tube in stable position. PICC line tip noted over the right atrium. 2.  Stable cardiomegaly. 3.  Low lung volumes with right base atelectasis/infiltrate. Electronically Signed   By: Marcello Moores  Register   On: 09/18/2019 06:54   Dg Chest Port 1 View  Result Date: 09/17/2019 CLINICAL DATA:  Respiratory failure. Follow-up exam. Intubated patient. EXAM: PORTABLE CHEST 1 VIEW COMPARISON:  09/16/2019 and exams. FINDINGS: Basilar atelectasis has improved.  Lungs now clear. No pneumothorax or pleural effusion. Endotracheal tube, enteric tube and left PICC are stable. IMPRESSION: 1. Lungs now clear. 2. No new abnormalities. 3. Stable support apparatus. Electronically Signed   By: Lajean Manes M.D.   On: 09/17/2019 06:35   Dg Chest Port 1 View  Result Date: 09/16/2019 CLINICAL DATA:  Acute respiratory failure.  Follow-up exam. EXAM: PORTABLE CHEST 1 VIEW COMPARISON:  09/15/2019 and earlier exams. FINDINGS: Cardiac silhouette mildly enlarged. Atelectasis noted at the right lung base on the prior study has improved. Remainder of the lungs is clear. Endotracheal tube, left-sided PICC and enteric feeding tube are stable. IMPRESSION:  1. No acute cardiopulmonary disease. Right lung base atelectasis has improved. 2. Support apparatus is stable and well positioned. Electronically Signed   By: Lajean Manes M.D.   On: 09/16/2019 11:36   Dg Chest Port 1 View  Result Date: 09/15/2019 CLINICAL DATA:  Respiratory failure. EXAM: PORTABLE CHEST 1 VIEW COMPARISON:  Chest x-ray 09/10/2019. FINDINGS: Endotracheal tube noted with tip 3 cm above the carina. Feeding tube noted with tip below left hemidiaphragm. Left PICC line noted with tip at cavoatrial junction. Stable cardiomegaly. Progressive right  lower lobe atelectasis/infiltrate. No pleural effusion or pneumothorax. IMPRESSION: 1.  Lines and tubes in good anatomic position. 2.  Progressive right lower lobe atelectasis/infiltrate. Electronically Signed   By: Marcello Moores  Register   On: 09/15/2019 08:14   Dg Chest Port 1 View  Result Date: 09/10/2019 CLINICAL DATA:  Endotracheal tube placement. EXAM: PORTABLE CHEST 1 VIEW COMPARISON:  09/09/2019 FINDINGS: Endotracheal tube has tip 4.1 cm above the carina. Enteric tube courses into the stomach and off the film as tip is not visualized. Lungs are adequately inflated with mild stable elevation of the right hemidiaphragm. No focal lobar consolidation or effusion. Previous seen hazy density over the right midlung is improved to nearly resolved likely resolving atelectasis. Cardiomediastinal silhouette and remainder of the exam is unchanged. IMPRESSION: Resolving right midlung atelectasis. Tubes and lines as described. Electronically Signed   By: Marin Olp M.D.   On: 09/10/2019 08:41   Dg Chest Port 1 View  Result Date: 09/09/2019 CLINICAL DATA:  Respiratory failure. EXAM: PORTABLE CHEST 1 VIEW COMPARISON:  09/09/2019 FINDINGS: Endotracheal tube tip 5 cm above the carina. Enteric tube courses into the stomach and off the film as tip is not visualized. Lungs are adequately inflated with mild elevation of the right hemidiaphragm. There is new hazy  airspace opacification over the right mid to lower lung likely atelectasis. Left lung is clear. Cardiomediastinal silhouette and remainder of the exam is unchanged. IMPRESSION: New hazy opacification over the right midlung likely atelectasis. Tubes and lines as described. Electronically Signed   By: Marin Olp M.D.   On: 09/09/2019 11:22   Dg Chest Port 1 View  Result Date: 09/09/2019 CLINICAL DATA:  ET and OG placement EXAM: PORTABLE CHEST 1 VIEW COMPARISON:  None. FINDINGS: Transesophageal tube tip and side port distal to the GE junction terminating below the level of imaging. Endotracheal tube tip terminates appropriately in the mid trachea, approximately 5 cm from the carina. Additional support devices overlie the chest. No consolidation, features of edema, pneumothorax, or effusion. Pulmonary vascularity is normally distributed. The cardiomediastinal contours are unremarkable. No acute osseous or soft tissue abnormality. IMPRESSION: No acute cardiopulmonary abnormality. Appropriate positioning of the endotracheal and transesophageal tubes. Electronically Signed   By: Lovena Le M.D.   On: 09/09/2019 03:30   Dg Chest Port 1v Same Day  Result Date: 09/28/2019 CLINICAL DATA:  Short of breath.  Code stroke EXAM: PORTABLE CHEST 1 VIEW COMPARISON:  09/23/2019 FINDINGS: Cardiac silhouette borderline enlarged. No mediastinal or hilar masses or evidence of adenopathy. Clear lungs.  No pleural effusion or pneumothorax. Skeletal structures are grossly intact. IMPRESSION: No acute cardiopulmonary disease. Electronically Signed   By: Lajean Manes M.D.   On: 09/28/2019 19:34   Dg Abd Portable 1v  Result Date: 09/21/2019 CLINICAL DATA:  Feeding tube placement EXAM: PORTABLE ABDOMEN - 1 VIEW COMPARISON:  None. FINDINGS: Feeding tube coils in the mid stomach. Nonobstructive bowel gas pattern. IMPRESSION: Feeding tube coils in the mid stomach. Electronically Signed   By: Rolm Baptise M.D.   On: 09/21/2019 21:11    Ct Head Code Stroke Wo Contrast  Result Date: 09/09/2019 CLINICAL DATA:  Code stroke.  Left-sided weakness EXAM: CT HEAD WITHOUT CONTRAST TECHNIQUE: Contiguous axial images were obtained from the base of the skull through the vertex without intravenous contrast. COMPARISON:  None. FINDINGS: Brain: There is an intraparenchymal hematoma centered in the left basal ganglia measuring 7.8 x 4.8 cm. There is rightward midline shift that measures 1.4 cm. There is early  subfalcine herniation. Hemorrhage has extended into the lateral ventricles and third ventricle. There is a small focus of intraparenchymal hemorrhage at the right basal ganglia measuring approximately 4 mm. There is right ventricular entrapment with early dilatation of the temporal horn. Basal cisterns are effaced. Vascular: No hyperdense vessel or unexpected calcification. Skull: Normal. Negative for fracture or focal lesion. Sinuses/Orbits: No acute finding. Other: None. ASPECTS [facility] Stroke Program Early CT Score) is not applicable in the case of acute hemorrhage. IMPRESSION: 1. 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 2. Intraventricular extension of hemorrhage. Small focus of intraparenchymal hemorrhage in the right basal ganglia. Critical Value/emergent results were called by telephone at the time of interpretation on 09/09/2019 at 3:21 am to provider Dr. Kerney Elbe, Who verbally acknowledged these results. Electronically Signed   By: Ulyses Jarred M.D.   On: 09/09/2019 03:42   [place] Ekg Site Rite  Result Date: 09/11/2019 If Site Rite image not attached, placement could not be confirmed due to current cardiac rhythm.    LOS: 20 days   Oren Binet, MD  [facility]  If 7PM-7AM, please contact night-coverage  Please page via [email or web address]  Go to [email or web address] and use [place]'s universal password to access. If you  do not have the password, please contact the hospital operator.  Locate the [facility] provider you are looking for under [facility] and page to a number that you can be directly reached. If you still have difficulty reaching the provider, please page the [facility] (Director on Call) for the Hospitalists listed on amion for assistance.  09/29/2019, 1:35 PM

## 2019-09-30 DIAGNOSIS — N1831 Chronic kidney disease, stage 3a: Secondary | ICD-10-CM

## 2019-09-30 DIAGNOSIS — R000 Tachycardia, unspecified: Secondary | ICD-10-CM

## 2019-09-30 LAB — GLUCOSE, CAPILLARY
Glucose-Capillary: 119 mg/dL — ABNORMAL HIGH (ref 70–99)
Glucose-Capillary: 131 mg/dL — ABNORMAL HIGH (ref 70–99)
Glucose-Capillary: 155 mg/dL — ABNORMAL HIGH (ref 70–99)
Glucose-Capillary: 159 mg/dL — ABNORMAL HIGH (ref 70–99)

## 2019-09-30 MED ORDER — LABETALOL HCL 200 MG PO TABS
300.0000 mg | ORAL_TABLET | Freq: Three times a day (TID) | ORAL | Status: DC
  Administered 2019-09-30 – 2019-11-03 (×99): 300 mg
  Filled 2019-09-30 (×106): qty 1

## 2019-09-30 NOTE — Progress Notes (Signed)
Patient ID: Joshua [name], male   DOB: [birth year], 39 y.o.   MRN: [id number] Stable.  No change in exam.

## 2019-09-30 NOTE — Progress Notes (Signed)
PROGRESS NOTE        PATIENT DETAILS Name: Joshua [name] Age: 38 y.o. Sex: male Date of Birth: [birth year] Admit Date: 09/09/2019 Admitting Physician Erline Levine, MD NIO:[id number], [name]  Brief Narrative: Patient is a 38 y.o. male with history of HTN, seizure disorder, alcohol abuse-who with seizure and right-sided hemiplegia-upon further evaluation was found to have a large left intracranial hemorrhage.  Patient was intubated-admitted to the ICU.  Evaluated by neurosurgery-underwent left frontal temporal craniectomy and evacuation of the hematoma.  Upon further improvement-transferred to the triad hospitalist service on 9/30.  Significant events: 9/12>>Admitted/  operating room for left frontal temporal craniectomy and evacuation hematoma 9/18 extubated > reintubated due to protect airway and low oxygen 9/23 > Extubated  9/30>> transferred to triad hospitalist. 10/1>> PEG tube placement by CCS  Subjective: Remains aphasic-no major events overnight.  Continues to have dense right-sided hemiplegia.  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  CTA head negative for aneurysm or vascular malformation.  Remains with dense right-sided hemiplegia and aphasia.  Patient is now s/p PEG tube placement.  CIR-appropriate for SNF-we will consult social work.  Aspiration pneumonia: Continue n.p.o. status-now on tube feedings-completed a course of antimicrobial therapy.  Due to severe dysphagia from CVA-remains at risk of aspiration-continue Robinul.  Have asked RN to always keep head end of the bed raised to more than 45 degrees.  Continue maximum aspiration  precautions.  Dysphagia: Secondary to CVA-underwent PEG tube placement on 10/1-tolerating PEG tube feedings.  AKI versus CKD stage IV: Suspect has underlying CKD stage III-probably from hypertensive nephrosclerosis.  Continue to follow electrolytes closely.  UA without proteinuria.  Renal ultrasound suggestive of chronic kidney disease.  .  EtOH abuse: No obvious signs of withdrawal-has completed a clonidine taper.  HTN: Stable-continue with Norvasc, labetalol and hydralazine.  Will follow and adjust accordingly.  Seizure: Continue Keppra  Nutrition Problem: Nutrition Problem: Inadequate oral intake Etiology: inability to eat Signs/Symptoms: NPO status Interventions: Refer to RD note for recommendations   Diet: Diet Order            Diet NPO time specified  Diet effective midnight               DVT Prophylaxis: Prophylactic Heparin   Code Status: Full code   Family Communication: None at bedside  Disposition Plan: Remain inpatient-SNF on discharge-not a CIR candidate.  Antimicrobial agents: Anti-infectives (From admission, onward)   Start     Dose/Rate Route Frequency Ordered Stop   09/27/19 1130  cephALEXin (KEFLEX) 250 MG/5ML suspension 500 mg     500 mg Per Tube Every 12 hours 09/27/19 1052 09/29/19 2250   09/25/19 1445  ceFAZolin (ANCEF) IVPB 2g/100 mL premix  Status:  Discontinued     2 g 200 mL/hr over 30 Minutes Intravenous Every 12 hours 09/25/19 1417 09/27/19 1052   09/14/19 1000  Ampicillin-Sulbactam (UNASYN) 3 g in sodium chloride 0.9 % 100 mL IVPB     3 g 200 mL/hr over 30 Minutes Intravenous Every 8 hours 09/14/19 0922 09/17/19 1816   09/13/19 1100  vancomycin (VANCOCIN) IVPB  750 mg/150 ml premix  Status:  Discontinued     750 mg 150 mL/hr over 60 Minutes Intravenous Every 24 hours 09/13/19 0728 09/13/19 0838   09/12/19 1100  vancomycin (VANCOCIN) IVPB 1000 mg/200 mL premix  Status:  Discontinued     1,000 mg 200 mL/hr over 60 Minutes Intravenous  Every 24 hours 09/11/19 1034 09/13/19 0728   09/11/19 1130  piperacillin-tazobactam (ZOSYN) IVPB 3.375 g  Status:  Discontinued     3.375 g 12.5 mL/hr over 240 Minutes Intravenous Every 8 hours 09/11/19 1054 09/14/19 0845   09/11/19 1100  vancomycin (VANCOCIN) 1,500 mg in sodium chloride 0.9 % 500 mL IVPB     1,500 mg 250 mL/hr over 120 Minutes Intravenous  Once 09/11/19 1034 09/11/19 1900   09/11/19 1015  piperacillin-tazobactam (ZOSYN) IVPB 4.5 g  Status:  Discontinued     4.5 g 200 mL/hr over 30 Minutes Intravenous Every 8 hours 09/11/19 1010 09/11/19 1052   09/09/19 0845  ceFAZolin (ANCEF) IVPB 2g/100 mL premix     2 g 200 mL/hr over 30 Minutes Intravenous Every 8 hours 09/09/19 0842 09/09/19 1658      Procedures:  left frontal temporal craniectomy and evacuation hematoma>> 9/12 ETT > 9/12- 9/18 re-intubated 9/18-9/23 Right radial A-line 9/12 > 9/15 L cortrak 9/14 >> Right Brachial PICC 9/14 >> 9/19 10/1>> PEG tube placement by CCS  CONSULTS:  pulmonary/intensive care, neurology and Neurosurgery  Time spent: 25 minutes-Greater than 50% of this time was spent in counseling, explanation of diagnosis, planning of further management, and coordination of care.  MEDICATIONS: Scheduled Meds:  alteplase  2 mg Intracatheter Once   amLODipine  10 mg Per Tube Daily   chlorhexidine gluconate (MEDLINE KIT)  15 mL Mouth Rinse BID   Chlorhexidine Gluconate Cloth  6 each Topical Daily   folic acid  1 mg Per Tube Daily   free water  200 mL Per Tube Q4H   glycopyrrolate  0.1 mg Intravenous TID   heparin injection (subcutaneous)  5,000 Units Subcutaneous Q8H   hydrALAZINE  50 mg Per Tube Q8H   insulin aspart  0-15 Units Subcutaneous Q4H   labetalol  200 mg Per Tube TID   levETIRAcetam  500 mg Per Tube BID   lipase/protease/amylase)  20,880 Units Per Tube Once   And   sodium bicarbonate  650 mg Per Tube Once   mouth rinse  15 mL Mouth Rinse 10 times per day    multivitamin  15 mL Per Tube Daily   pantoprazole sodium  40 mg Per Tube BID   QUEtiapine  50 mg Per Tube BID   sodium chloride flush  10-40 mL Intracatheter Q12H   thiamine  100 mg Per Tube Daily   Continuous Infusions:  feeding supplement (OSMOLITE 1.5 CAL) 65 mL/hr at 09/27/19 0200   PRN Meds:.acetaminophen **OR** acetaminophen, bisacodyl, hydrALAZINE, labetalol, [DISCONTINUED] ondansetron **OR** ondansetron (ZOFRAN) IV, polyethylene glycol, promethazine, sodium chloride flush, traMADol   PHYSICAL EXAM: Vital signs: Vitals:   09/29/19 2041 09/29/19 2346 09/30/19 0400 09/30/19 1000  BP: (!) 146/106 (!) 128/101 (!) 147/107 (!) 127/94  Pulse: (!) 113 (!) 113 (!) 115 (!) 107  Resp:  '20 20 18  ' Temp: 98.9 F (37.2 C) (!) 97.5 F (36.4 C) 98 F (36.7 C) 98.4 F (36.9 C)  TempSrc: Oral Oral Axillary Axillary  SpO2: 100% 100% 100% 96%  Weight:      Height:       Filed Weights   09/27/19 0500  09/28/19 0333 09/29/19 0109  Weight: 60.8 kg 61.3 kg 60 kg   Body mass index is 19.53 kg/m.   Gen Exam:Alert and aphasic-really does not respond to either verbal or physical commands. HEENT:atraumatic, normocephalic Chest: B/L clear to auscultation anteriorly CVS:S1S2 regular Abdomen:soft non tender, non distended Extremities:no edema Neurology: Right-sided hemiplegia Skin: no rash I have personally reviewed following labs and imaging studies  LABORATORY DATA: CBC: Recent Labs  Lab 09/24/19 0557 09/28/19 0440  WBC 8.1 10.5  HGB 10.2* 10.7*  HCT 30.8* 33.1*  MCV 90.6 91.2  PLT 427* 432*    Basic Metabolic Panel: Recent Labs  Lab 09/24/19 0557 09/26/19 0801 09/28/19 0440 09/29/19 0436  NA 146* 144 144 143  K 4.9 4.8 5.0 4.6  CL 111 109 110 107  CO2 '23 24 24 25  ' GLUCOSE 130* 121* 115* 160*  BUN 58* 63* 58* 58*  CREATININE 2.54* 2.39* 2.28* 2.43*  CALCIUM 9.7 9.6 10.1 9.8  PHOS 5.8*  --   --   --     GFR: Estimated Creatinine Clearance: 34.6 mL/min (A) (by  C-G formula based on SCr of 2.43 mg/dL (H)).  Liver Function Tests: Recent Labs  Lab 09/24/19 0557  AST 27  ALT 29  ALKPHOS 54  BILITOT 0.3  PROT 7.5  ALBUMIN 2.8*   No results for input(s): LIPASE, AMYLASE in the last 168 hours. No results for input(s): AMMONIA in the last 168 hours.  Coagulation Profile: No results for input(s): INR, PROTIME in the last 168 hours.  Cardiac Enzymes: No results for input(s): CKTOTAL, CKMB, CKMBINDEX, TROPONINI in the last 168 hours.  BNP (last 3 results) No results for input(s): PROBNP in the last 8760 hours.  HbA1C: No results for input(s): HGBA1C in the last 72 hours.  CBG: Recent Labs  Lab 09/29/19 1149 09/29/19 1554 09/29/19 1956 09/30/19 0339 09/30/19 0807  GLUCAP 130* 119* 144* 159* 119*    Lipid Profile: No results for input(s): CHOL, HDL, LDLCALC, TRIG, CHOLHDL, LDLDIRECT in the last 72 hours.  Thyroid Function Tests: No results for input(s): TSH, T4TOTAL, FREET4, T3FREE, THYROIDAB in the last 72 hours.  Anemia Panel: No results for input(s): VITAMINB12, FOLATE, FERRITIN, TIBC, IRON, RETICCTPCT in the last 72 hours.  Urine analysis:    Component Value Date/Time   COLORURINE YELLOW (A) 09/23/2019 1108   APPEARANCEUR CLEAR (A) 09/23/2019 1108   LABSPEC 1.015 09/23/2019 1108   PHURINE 7.5 09/23/2019 1108   GLUCOSEU NEGATIVE 09/23/2019 1108   HGBUR NEGATIVE 09/23/2019 1108   [place] 09/23/2019 1108   [place] 09/23/2019 1108   PROTEINUR NEGATIVE 09/23/2019 1108   UROBILINOGEN 1.0 04/05/2009 0021   NITRITE NEGATIVE 09/23/2019 1108   LEUKOCYTESUR NEGATIVE 09/23/2019 1108    Sepsis Labs: Lactic Acid, Venous No results found for: LATICACIDVEN  MICROBIOLOGY: Recent Results (from the past 240 hour(s))  Urine Culture     Status: Abnormal   Collection Time: 09/23/19 10:01 AM   Specimen: Urine, Random  Result Value Ref Range Status   Specimen Description URINE, RANDOM  Final   Special  Requests   Final    NONE Performed at [facility], [place] [address] [place], [place] [address]    Culture >=100,000 COLONIES/mL KLEBSIELLA OXYTOCA (A)  Final   Report Status 09/25/2019 FINAL  Final   Organism ID, Bacteria KLEBSIELLA OXYTOCA (A)  Final      Susceptibility   Klebsiella oxytoca - MIC*    AMPICILLIN >=32 RESISTANT Resistant  CEFAZOLIN <=4 SENSITIVE Sensitive     CEFTRIAXONE <=1 SENSITIVE Sensitive     CIPROFLOXACIN <=0.25 SENSITIVE Sensitive     GENTAMICIN <=1 SENSITIVE Sensitive     IMIPENEM <=0.25 SENSITIVE Sensitive     NITROFURANTOIN <=16 SENSITIVE Sensitive     TRIMETH/SULFA <=20 SENSITIVE Sensitive     AMPICILLIN/SULBACTAM >=32 RESISTANT Resistant     PIP/TAZO >=128 RESISTANT Resistant     Extended ESBL NEGATIVE Sensitive     * >=100,000 COLONIES/mL KLEBSIELLA OXYTOCA    RADIOLOGY STUDIES/RESULTS: Ct Angio Head W Or Wo Contrast  Result Date: 09/10/2019 CLINICAL DATA:  Intracranial hemorrhage follow up EXAM: CT HEAD WITHOUT CONTRAST CT ANGIOGRAPHY OF THE HEAD TECHNIQUE: Contiguous axial images were obtained from the base of the skull through the vertex without intravenous contrast. Multidetector CT imaging of the head was performed using the standard protocol during bolus administration of intravenous contrast. Multiplanar CT image reconstructions and MIPs were obtained to evaluate the vascular anatomy. CONTRAST:  47m OMNIPAQUE IOHEXOL 350 MG/ML SOLN COMPARISON:  Head CT 09/09/2019 FINDINGS: CT HEAD FINDINGS Brain: Status post hematoma evacuation. Residual hematoma with interspersed postoperative gas measures 6.1 x 2.9 cm, centered in the left basal ganglia. Entrapment of the right lateral ventricle temporal horn has resolved. Volume a blood within the lateral ventricles is unchanged. Punctate focus of hemorrhage at the posterior right insula is unchanged. There is now extra-axial blood over the left convexity underlying the craniectomy site. Midline shift has  improved, now measuring 6 mm to the right. Skull: Left pterional craniectomy Sinuses/Orbits: Negative CTA HEAD FINDINGS POSTERIOR CIRCULATION: --Vertebral arteries: Normal V4 segments. --Posterior inferior cerebellar arteries (PICA): Patent origins from the vertebral arteries. --Anterior inferior cerebellar arteries (AICA): Patent origins from the basilar artery. --Basilar artery: Normal. --Superior cerebellar arteries: Normal. --Posterior cerebral arteries: Normal. Both originate from the basilar artery. Posterior communicating arteries (p-comm) are diminutive or absent. ANTERIOR CIRCULATION: --Intracranial internal carotid arteries: Normal. --Anterior cerebral arteries (ACA): Normal. Both A1 segments are present. Patent anterior communicating artery (a-comm). --Middle cerebral arteries (MCA): Normal. VENOUS SINUSES: As permitted by contrast timing, patent. ANATOMIC VARIANTS: None Review of the MIP images confirms the above findings. IMPRESSION: 1. Status post left pterional craniectomy for evacuation of left basal ganglia hematoma. Residual hematoma with interspersed postoperative gas measures 6.1 x 2.9 cm. 2. Decreased midline shift, now measuring 6 mm to the right. 3. Unchanged punctate focus of hemorrhage at the posterior right insula. 4. Unchanged intraventricular blood. 5. No intracranial arterial occlusion or high-grade stenosis. No aneurysm or vascular malformation. Electronically Signed   By: KUlyses JarredM.D.   On: 09/10/2019 05:58   Ct Head Wo Contrast  Result Date: 09/17/2019 CLINICAL DATA:  Intracranial hemorrhage follow up EXAM: CT HEAD WITHOUT CONTRAST TECHNIQUE: Contiguous axial images were obtained from the base of the skull through the vertex without intravenous contrast. COMPARISON:  Head CT 09/13/2019 FINDINGS: Brain: The size of the intraparenchymal hematoma centered in the left basal ganglia is unchanged. Surrounding edema and postsurgical changes also remain the same. There is a small  amount of blood within both occipital horns. Rightward midline shift measures 6 mm at the level of the foramina Monro, unchanged. No new site of hemorrhage. Vascular: No abnormal hyperdensity of the major intracranial arteries or dural venous sinuses. No intracranial atherosclerosis. Skull: Left pterional craniectomy with soft tissue swelling overlying. Sinuses/Orbits: No fluid levels or advanced mucosal thickening of the visualized paranasal sinuses. No mastoid or middle ear effusion. The orbits are normal. IMPRESSION: 1.  Unchanged size of intraparenchymal hematoma centered in the left basal ganglia with unchanged surrounding edema and mass effect. 2. Unchanged rightward midline shift, measuring 6 mm. Electronically Signed   By: Ulyses Jarred M.D.   On: 09/17/2019 01:46   Ct Head Wo Contrast  Result Date: 09/13/2019 CLINICAL DATA:  Stroke follow-up. EXAM: CT HEAD WITHOUT CONTRAST TECHNIQUE: Contiguous axial images were obtained from the base of the skull through the vertex without intravenous contrast. COMPARISON:  09/10/2019 and 09/09/2019 FINDINGS: Brain: Evidence of patient's recent evacuation of central left-sided hemorrhagic stroke. Residual hemorrhage over the left basal ganglia measures approximately 2.8 x 6.5 cm without significant change. There is less air within the central aspect of the hemorrhage. Stable adjacent postsurgical change, edema and minimal mass effect. Stable mild midline shift to the right of 5-6 mm. Persistent 4 mm hemorrhagic focus over the right basal ganglia is becoming less dense. Vascular: No hyperdense vessel or unexpected calcification. Skull: Evidence of recent right frontal parietal craniectomy. Skin staples over the left scalp. Sinuses/Orbits: Orbits are normal. Paranasal sinuses are well aerated. Other: Periodontal disease. IMPRESSION: Stable changes post left frontoparietal craniectomy for evacuation of left-sided central hemorrhagic stroke. Residual without significant  change measuring 2.8 x 6.5 cm with less air within the hemorrhage. Stable postsurgical change with mild edema/mass effect and stable midline shift to the right of 5-6 mm. Decrease in density 4 mm focus of hemorrhage over the right basal ganglia. Electronically Signed   By: Marin Olp M.D.   On: 09/13/2019 08:24   Ct Head Wo Contrast  Result Date: 09/10/2019 CLINICAL DATA:  Intracranial hemorrhage follow up EXAM: CT HEAD WITHOUT CONTRAST CT ANGIOGRAPHY OF THE HEAD TECHNIQUE: Contiguous axial images were obtained from the base of the skull through the vertex without intravenous contrast. Multidetector CT imaging of the head was performed using the standard protocol during bolus administration of intravenous contrast. Multiplanar CT image reconstructions and MIPs were obtained to evaluate the vascular anatomy. CONTRAST:  21m OMNIPAQUE IOHEXOL 350 MG/ML SOLN COMPARISON:  Head CT 09/09/2019 FINDINGS: CT HEAD FINDINGS Brain: Status post hematoma evacuation. Residual hematoma with interspersed postoperative gas measures 6.1 x 2.9 cm, centered in the left basal ganglia. Entrapment of the right lateral ventricle temporal horn has resolved. Volume a blood within the lateral ventricles is unchanged. Punctate focus of hemorrhage at the posterior right insula is unchanged. There is now extra-axial blood over the left convexity underlying the craniectomy site. Midline shift has improved, now measuring 6 mm to the right. Skull: Left pterional craniectomy Sinuses/Orbits: Negative CTA HEAD FINDINGS POSTERIOR CIRCULATION: --Vertebral arteries: Normal V4 segments. --Posterior inferior cerebellar arteries (PICA): Patent origins from the vertebral arteries. --Anterior inferior cerebellar arteries (AICA): Patent origins from the basilar artery. --Basilar artery: Normal. --Superior cerebellar arteries: Normal. --Posterior cerebral arteries: Normal. Both originate from the basilar artery. Posterior communicating arteries (p-comm)  are diminutive or absent. ANTERIOR CIRCULATION: --Intracranial internal carotid arteries: Normal. --Anterior cerebral arteries (ACA): Normal. Both A1 segments are present. Patent anterior communicating artery (a-comm). --Middle cerebral arteries (MCA): Normal. VENOUS SINUSES: As permitted by contrast timing, patent. ANATOMIC VARIANTS: None Review of the MIP images confirms the above findings. IMPRESSION: 1. Status post left pterional craniectomy for evacuation of left basal ganglia hematoma. Residual hematoma with interspersed postoperative gas measures 6.1 x 2.9 cm. 2. Decreased midline shift, now measuring 6 mm to the right. 3. Unchanged punctate focus of hemorrhage at the posterior right insula. 4. Unchanged intraventricular blood. 5. No intracranial arterial occlusion or high-grade  stenosis. No aneurysm or vascular malformation. Electronically Signed   By: Ulyses Jarred M.D.   On: 09/10/2019 05:58   US Renal  Result Date: 09/13/2019 CLINICAL DATA:  38 year old male with acute renal insufficiency. EXAM: RENAL / URINARY TRACT ULTRASOUND COMPLETE COMPARISON:  None. FINDINGS: Evaluation is somewhat limited due to inability of the patient to cooperate with the exam. Right Kidney: Renal measurements: 10.9 x 4.9 x 6.4 cm = volume: 179 mL. There is increased renal parenchymal echogenicity. There is mild hydronephrosis of the right kidney. No shadowing stone. Left Kidney: Renal measurements: 9.8 x 4.8 x 5.9 cm = volume: 145 mL. There is increased renal parenchymal echogenicity. Mild hydronephrosis. No shadowing stone. Bladder: The urinary bladder is distended otherwise unremarkable. Ureteral jets are not visualized. IMPRESSION: 1. Mildly echogenic kidneys likely related to underlying medical renal disease. 2. Mild bilateral hydronephrosis.  No shadowing stone. Electronically Signed   By: Anner Crete M.D.   On: 09/13/2019 11:41   Dg Chest Port 1 View  Result Date: 09/23/2019 CLINICAL DATA:  Fever EXAM:  PORTABLE CHEST 1 VIEW COMPARISON:  09/18/2019 FINDINGS: There has been interval endotracheal extubation. There is no acute appearing airspace opacity. Cardiomegaly. Partially imaged enteric feeding tube. IMPRESSION: 1. There has been interval endotracheal extubation. There is no acute appearing airspace opacity. 2.  Cardiomegaly. Electronically Signed   By: Eddie Candle M.D.   On: 09/23/2019 13:18   Dg Chest Port 1 View  Result Date: 09/18/2019 CLINICAL DATA:  Intubation.  Seizure.  Head injury. EXAM: PORTABLE CHEST 1 VIEW COMPARISON:  09/17/2019. FINDINGS: Endotracheal tube, feeding tube in stable position position. PICC line tip noted over the right atrium. Stable cardiomegaly. Low lung volumes with right base atelectasis/infiltrate. No pleural effusion or pneumothorax. IMPRESSION: 1. Endotracheal tube, feeding tube in stable position. PICC line tip noted over the right atrium. 2.  Stable cardiomegaly. 3.  Low lung volumes with right base atelectasis/infiltrate. Electronically Signed   By: Marcello Moores  Register   On: 09/18/2019 06:54   Dg Chest Port 1 View  Result Date: 09/17/2019 CLINICAL DATA:  Respiratory failure. Follow-up exam. Intubated patient. EXAM: PORTABLE CHEST 1 VIEW COMPARISON:  09/16/2019 and exams. FINDINGS: Basilar atelectasis has improved.  Lungs now clear. No pneumothorax or pleural effusion. Endotracheal tube, enteric tube and left PICC are stable. IMPRESSION: 1. Lungs now clear. 2. No new abnormalities. 3. Stable support apparatus. Electronically Signed   By: Lajean Manes M.D.   On: 09/17/2019 06:35   Dg Chest Port 1 View  Result Date: 09/16/2019 CLINICAL DATA:  Acute respiratory failure.  Follow-up exam. EXAM: PORTABLE CHEST 1 VIEW COMPARISON:  09/15/2019 and earlier exams. FINDINGS: Cardiac silhouette mildly enlarged. Atelectasis noted at the right lung base on the prior study has improved. Remainder of the lungs is clear. Endotracheal tube, left-sided PICC and enteric feeding tube  are stable. IMPRESSION: 1. No acute cardiopulmonary disease. Right lung base atelectasis has improved. 2. Support apparatus is stable and well positioned. Electronically Signed   By: Lajean Manes M.D.   On: 09/16/2019 11:36   Dg Chest Port 1 View  Result Date: 09/15/2019 CLINICAL DATA:  Respiratory failure. EXAM: PORTABLE CHEST 1 VIEW COMPARISON:  Chest x-ray 09/10/2019. FINDINGS: Endotracheal tube noted with tip 3 cm above the carina. Feeding tube noted with tip below left hemidiaphragm. Left PICC line noted with tip at cavoatrial junction. Stable cardiomegaly. Progressive right lower lobe atelectasis/infiltrate. No pleural effusion or pneumothorax. IMPRESSION: 1.  Lines and tubes in good anatomic position.  2.  Progressive right lower lobe atelectasis/infiltrate. Electronically Signed   By: Marcello Moores  Register   On: 09/15/2019 08:14   Dg Chest Port 1 View  Result Date: 09/10/2019 CLINICAL DATA:  Endotracheal tube placement. EXAM: PORTABLE CHEST 1 VIEW COMPARISON:  09/09/2019 FINDINGS: Endotracheal tube has tip 4.1 cm above the carina. Enteric tube courses into the stomach and off the film as tip is not visualized. Lungs are adequately inflated with mild stable elevation of the right hemidiaphragm. No focal lobar consolidation or effusion. Previous seen hazy density over the right midlung is improved to nearly resolved likely resolving atelectasis. Cardiomediastinal silhouette and remainder of the exam is unchanged. IMPRESSION: Resolving right midlung atelectasis. Tubes and lines as described. Electronically Signed   By: Marin Olp M.D.   On: 09/10/2019 08:41   Dg Chest Port 1 View  Result Date: 09/09/2019 CLINICAL DATA:  Respiratory failure. EXAM: PORTABLE CHEST 1 VIEW COMPARISON:  09/09/2019 FINDINGS: Endotracheal tube tip 5 cm above the carina. Enteric tube courses into the stomach and off the film as tip is not visualized. Lungs are adequately inflated with mild elevation of the right hemidiaphragm.  There is new hazy airspace opacification over the right mid to lower lung likely atelectasis. Left lung is clear. Cardiomediastinal silhouette and remainder of the exam is unchanged. IMPRESSION: New hazy opacification over the right midlung likely atelectasis. Tubes and lines as described. Electronically Signed   By: Marin Olp M.D.   On: 09/09/2019 11:22   Dg Chest Port 1 View  Result Date: 09/09/2019 CLINICAL DATA:  ET and OG placement EXAM: PORTABLE CHEST 1 VIEW COMPARISON:  None. FINDINGS: Transesophageal tube tip and side port distal to the GE junction terminating below the level of imaging. Endotracheal tube tip terminates appropriately in the mid trachea, approximately 5 cm from the carina. Additional support devices overlie the chest. No consolidation, features of edema, pneumothorax, or effusion. Pulmonary vascularity is normally distributed. The cardiomediastinal contours are unremarkable. No acute osseous or soft tissue abnormality. IMPRESSION: No acute cardiopulmonary abnormality. Appropriate positioning of the endotracheal and transesophageal tubes. Electronically Signed   By: Lovena Le M.D.   On: 09/09/2019 03:30   Dg Chest Port 1v Same Day  Result Date: 09/28/2019 CLINICAL DATA:  Short of breath.  Code stroke EXAM: PORTABLE CHEST 1 VIEW COMPARISON:  09/23/2019 FINDINGS: Cardiac silhouette borderline enlarged. No mediastinal or hilar masses or evidence of adenopathy. Clear lungs.  No pleural effusion or pneumothorax. Skeletal structures are grossly intact. IMPRESSION: No acute cardiopulmonary disease. Electronically Signed   By: Lajean Manes M.D.   On: 09/28/2019 19:34   Dg Abd Portable 1v  Result Date: 09/21/2019 CLINICAL DATA:  Feeding tube placement EXAM: PORTABLE ABDOMEN - 1 VIEW COMPARISON:  None. FINDINGS: Feeding tube coils in the mid stomach. Nonobstructive bowel gas pattern. IMPRESSION: Feeding tube coils in the mid stomach. Electronically Signed   By: Rolm Baptise M.D.    On: 09/21/2019 21:11   Ct Head Code Stroke Wo Contrast  Result Date: 09/09/2019 CLINICAL DATA:  Code stroke.  Left-sided weakness EXAM: CT HEAD WITHOUT CONTRAST TECHNIQUE: Contiguous axial images were obtained from the base of the skull through the vertex without intravenous contrast. COMPARISON:  None. FINDINGS: Brain: There is an intraparenchymal hematoma centered in the left basal ganglia measuring 7.8 x 4.8 cm. There is rightward midline shift that measures 1.4 cm. There is early subfalcine herniation. Hemorrhage has extended into the lateral ventricles and third ventricle. There is a small focus of  intraparenchymal hemorrhage at the right basal ganglia measuring approximately 4 mm. There is right ventricular entrapment with early dilatation of the temporal horn. Basal cisterns are effaced. Vascular: No hyperdense vessel or unexpected calcification. Skull: Normal. Negative for fracture or focal lesion. Sinuses/Orbits: No acute finding. Other: None. ASPECTS [facility] Stroke Program Early CT Score) is not applicable in the case of acute hemorrhage. IMPRESSION: 1. 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 2. Intraventricular extension of hemorrhage. Small focus of intraparenchymal hemorrhage in the right basal ganglia. Critical Value/emergent results were called by telephone at the time of interpretation on 09/09/2019 at 3:21 am to provider Dr. Kerney Elbe, Who verbally acknowledged these results. Electronically Signed   By: Ulyses Jarred M.D.   On: 09/09/2019 03:42   [place] Ekg Site Rite  Result Date: 09/11/2019 If Site Rite image not attached, placement could not be confirmed due to current cardiac rhythm.    LOS: 21 days   Oren Binet, MD  [facility]  If 7PM-7AM, please contact night-coverage  Please page via [email or web address]  Go to [email or web address] and use [place] universal  password to access. If you do not have the password, please contact the hospital operator.  Locate the [facility] provider you are looking for under [facility] and page to a number that you can be directly reached. If you still have difficulty reaching the provider, please page the [facility] (Director on Call) for the Hospitalists listed on amion for assistance.  09/30/2019, 10:48 AM

## 2019-09-30 NOTE — Progress Notes (Signed)
STROKE TEAM PROGRESS NOTE   INTERVAL HISTORY Patient girlfriend is at bedside. Pt neuro stable. Global aphasia, but right LE seems improved on strength.    OBJECTIVE Vitals:   09/29/19 2346 09/30/19 0400 09/30/19 1000 09/30/19 1136  BP: (!) 128/101 (!) 147/107 (!) 127/94 (!) 128/100  Pulse: (!) 113 (!) 115 (!) 107 (!) 107  Resp: 20 20 18 16   Temp: (!) 97.5 F (36.4 C) 98 F (36.7 C) 98.4 F (36.9 C) 97.8 F (36.6 C)  TempSrc: Oral Axillary Axillary Axillary  SpO2: 100% 100% 96% 100%  Weight:      Height:        CBC:  Recent Labs  Lab 09/24/19 0557 09/28/19 0440  WBC 8.1 10.5  HGB 10.2* 10.7*  HCT 30.8* 33.1*  MCV 90.6 91.2  PLT 427* 432*    Basic Metabolic Panel:  Recent Labs  Lab 09/24/19 0557  09/28/19 0440 09/29/19 0436  NA 146*   < > 144 143  K 4.9   < > 5.0 4.6  CL 111   < > 110 107  CO2 23   < > 24 25  GLUCOSE 130*   < > 115* 160*  BUN 58*   < > 58* 58*  CREATININE 2.54*   < > 2.28* 2.43*  CALCIUM 9.7   < > 10.1 9.8  PHOS 5.8*  --   --   --    < > = values in this interval not displayed.    IMAGING Ct Head Code Stroke Wo Contrast 09/09/2019 1. 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  2. Intraventricular extension of hemorrhage. Small focus of intraparenchymal hemorrhage in the right basal ganglia.   CT head  CTA Head  09/10/2019 1. Status post left pterional craniectomy for evacuation of left basal ganglia hematoma. Residual hematoma with interspersed postoperative gas measures 6.1 x 2.9 cm. 2. Decreased midline shift, now measuring 6 mm to the right. 3. Unchanged punctate focus of hemorrhage at the posterior right insula. 4. Unchanged intraventricular blood. 5. No intracranial arterial occlusion or high-grade stenosis. No aneurysm or vascular malformation.   CT head  09/13/2019 Stable changes post left frontoparietal craniectomy for  evacuation of left-sided central hemorrhagic stroke. Residual without significant change measuring 2.8 x 6.5 cm with less air within the hemorrhage. Stable postsurgical change with mild edema/mass effect and stable midline shift to the right of 5-6 mm. Decrease in density 4 mm focus of hemorrhage over the right basal ganglia.  CT Head W/O Contrast 09/17/19 1. Unchanged size of intraparenchymal hematoma centered in the left basal ganglia with unchanged surrounding edema and mass effect. 2. Unchanged rightward midline shift, measuring 6 mm.  Renal US 09/13/2019 1. Mildly echogenic kidneys likely related to underlying medical renal disease. 2. Mild bilateral hydronephrosis.  No shadowing stone.  ECG - SR rate 97 BPM. LVH and prolonged QT interval. (See cardiology reading for complete details) Transthoracic Echocardiogram  09/16/2019 1. Left ventricular ejection fraction, by visual estimation, is 70 to 75%. The left ventricle has hyperdynamic function. Normal left ventricular size. There is severely increased left ventricular hypertrophy.  2. Global right ventricle has normal systolic function.The right ventricular size is normal. No increase in right ventricular wall thickness.  3. Left atrial size was normal.  4. Right atrial size was moderately dilated.  5. The mitral valve is grossly normal. Mild mitral valve regurgitation.  6. The tricuspid valve is grossly normal. Tricuspid valve regurgitation is trivial.  7. The aortic valve is tricuspid Aortic valve regurgitation was not visualized by color flow Doppler.  8. The pulmonic valve was grossly normal. Pulmonic valve regurgitation is not visualized by color flow Doppler.  9. The inferior vena cava was not able to be adequately assessed to estimate CVP.   PHYSICAL EXAM        Temp:  [97.5 F (36.4 C)-98.9 F (37.2 C)] 98.7 F (37.1 C) (10/03 1603) Pulse Rate:  [107-115] 108 (10/03 1603) Resp:  [16-20] 18 (10/03 1603) BP:  (127-158)/(94-107) 158/97 (10/03 1603) SpO2:  [96 %-100 %] 100 % (10/03 1603)  General - Well nourished, well developed, lethargic.  Ophthalmologic - fundi not visualized due to noncooperation.  Cardiovascular - Regular rhythm and rate.  Neuro - awake, eyes open, global aphasia, not following commands. Eyes left gaze preference and barely cross midline. Blinking to visual threat on the left, not on the right. PERRL, tracking on the left. Neck left turning position, right facial droop. Tongue protrusion not cooperative. Spontaneous movement on the left UE and LE. No movement on RUE even with pain. RLE 2/5 on pain stimulation. Positive babinski and triple reflex on the right. Sensation, coordination and gait not tested.   ASSESSMENT/PLAN   [name] is a 38 y.o. male with history of admission for Etoh poisoning presenting with a seizure, vomiting.   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 In addition, small focus of R basal ganglia ICH.   NSG - decompressive craniectomy and hematoma evacuation w/ flap in R abd - 09/09/19 Dr Vertell Limber  Code Stroke CT Head - Large intraparenchymal hematoma of the left basal ganglia measuring 7.8 x 4.8 cm with 14 mm of rightward midline shift and early subfalcine herniation. Intraventricular extension of hemorrhage. Small focus of intraparenchymal hemorrhage in the right basal ganglia.   CT/CTA head - 09/10/2019 - Status post left pterional craniectomy for evacuation of left basal ganglia hematoma. Residual hematoma with interspersed postoperative gas measures 6.1 x 2.9 cm. Decreased midline shift, now measuring 6 mm to the right. No intracranial arterial occlusion or high-grade stenosis. No aneurysm or vascular malformation  Ct head 9/16 and 9/20 stable crani. Stable 5-6 midline shift. Decreased R basal ganglia hemorrhage   HgbA1c 5.6  LDL 86  TTE 09/16/19 EF  70 to 75%. There is severely increased left ventricular hypertrophy.  [employer] Virus 2 - negative  UDS - negatived  VTE prophylaxis - Heparin 5000 units sq tid   No antithrombotic prior to admission, now on No antithrombotic d/t hemorrhage   Therapy recommendations: CIR  Disposition:  Pending  Cerebral Edema  S/p crani and hematoma evacuation  Treated with 3% saline protocol, now off  Na - 146->143  Now Free water 200 q4h   CT repeat 09/17/19 - Unchanged size of intraparenchymal hematoma and surrounding edema and mass effect.   PICC line out 9/25, for midline  Respiratory Failure  Secondary to [place]  Off sedation and extubated 9/18  Re-intubated 9/18 due to low O2 sat and not protect airway  Extubated 9/23, tolerating well  Off precedex, on seroquel 50 bid, clonidine taper  CCM signed off 9/29 /20  VAP vs Aspiration PNA (vomited PTA) Leukocytosis, Febrile - resolved  WBC - 10.5 (on 10/1)  Afebrile  U/A neg 9/14  Blood cultures normal  flora  CXR progressive RLL atx/infilatrate -> CXR - 09/16/19 neg  D/c Vanc, d/c Zosyn -> Unasyn 9/17-9/20  Hypertensive Emergency  Probable undiagnosed / untreated hypertension (severe LVH on ECG)  Home BP meds: none  Now on amlodipine 10, labetolol 300 tid    Hydralazine discontinued due to tachycardia  Off Lisinopril D/t increasing Cr  SBP goal < 160 mm Hg     Off cleviprex   BP stable   Seizure  On presentation  Hx of one seizure previously  On Keppra 500 mg Bid  AKI on CKD stage III  ACE stopped 9/14.   Renal US mild echogenic kidneys c/w CKD  Creatinine - 2.39->2.43  Continue TF and free water    Alcohol abuse  Hx ETOH abuse  recent hospitalization for alcohol poisoning.   Drinks 1 pt [place] per day.   On FA/MVI/B1  On CIWA protocol.  Hyperlipidemia  Home Lipid lowering medication: none  LDL - 86  Current lipid lowering medication: None  May consider statin at  discharge  Dysphagia Malnutrition . Secondary to stroke . NPO  Has cortrak and tube feeding @ 53 on hold  Speech following - unable to clear for diet  PEG placed 10/1 by trauma surgeons   Anemia of critical illness  Hgb 14.5->...10.2->10.7  Unclear etiology  On PPI BID  Close CBC monitoring  Other Stroke Risk Factors  Polysubstance abuse, UDS - negative 09/09/19  Other Active Problems  U/A and culture -> U/A - negative ; Culture > 100k Klebsiella oxytoca. ancef 9/28, enteral keflex 9/30>>pt completed 3 day course of Keflex  Tachycardia - ? Cause - d/c hydralazeine - check labs in AM  Hospital day # 21  Rosalin Hawking, MD PhD Stroke Neurology 09/30/2019 5:00 PM    To contact Stroke Continuity provider, please refer to [email or web address] After hours, contact General Neurology

## 2019-10-01 DIAGNOSIS — N1832 Chronic kidney disease, stage 3b: Secondary | ICD-10-CM

## 2019-10-01 LAB — BASIC METABOLIC PANEL
Anion gap: 13 (ref 5–15)
BUN: 65 mg/dL — ABNORMAL HIGH (ref 6–20)
CO2: 25 mmol/L (ref 22–32)
Calcium: 10.1 mg/dL (ref 8.9–10.3)
Chloride: 103 mmol/L (ref 98–111)
Creatinine, Ser: 2.44 mg/dL — ABNORMAL HIGH (ref 0.61–1.24)
GFR calc Af Amer: 37 mL/min — ABNORMAL LOW (ref >60)
GFR calc non Af Amer: 32 mL/min — ABNORMAL LOW (ref >60)
Glucose, Bld: 112 mg/dL — ABNORMAL HIGH (ref 70–99)
Potassium: 5.2 mmol/L — ABNORMAL HIGH (ref 3.5–5.1)
Sodium: 141 mmol/L (ref 135–145)

## 2019-10-01 LAB — CBC
HCT: 33.5 % — ABNORMAL LOW (ref 39.0–52.0)
Hemoglobin: 10.7 g/dL — ABNORMAL LOW (ref 13.0–17.0)
MCH: 29.1 pg (ref 26.0–34.0)
MCHC: 31.9 g/dL (ref 30.0–36.0)
MCV: 91 fL (ref 80.0–100.0)
Platelets: 400 10*3/uL (ref 150–400)
RBC: 3.68 MIL/uL — ABNORMAL LOW (ref 4.22–5.81)
RDW: 13.8 % (ref 11.5–15.5)
WBC: 10.3 10*3/uL (ref 4.0–10.5)
nRBC: 0 % (ref 0.0–0.2)

## 2019-10-01 LAB — GLUCOSE, CAPILLARY
Glucose-Capillary: 132 mg/dL — ABNORMAL HIGH (ref 70–99)
Glucose-Capillary: 146 mg/dL — ABNORMAL HIGH (ref 70–99)

## 2019-10-01 LAB — MAGNESIUM: Magnesium: 2.3 mg/dL (ref 1.7–2.4)

## 2019-10-01 LAB — PHOSPHORUS: Phosphorus: 6.3 mg/dL — ABNORMAL HIGH (ref 2.5–4.6)

## 2019-10-01 MED ORDER — SODIUM POLYSTYRENE SULFONATE 15 GM/60ML PO SUSP
30.0000 g | Freq: Once | ORAL | Status: AC
  Administered 2019-10-01: 30 g
  Filled 2019-10-01: qty 120

## 2019-10-01 NOTE — TOC Initial Note (Signed)
Transition of Care [facility]) - Initial/Assessment Note    Patient Details  Name: Joshua [name] MRN: [id number] Date of Birth: [birth year]  Transition [facility]) CM/SW Contact:    Gelene Mink, [place] Phone Number: 10/01/2019, 2:02 PM  Clinical Narrative:      CSW called and spoke with the patient's mother, [name]. CSW introduced herself and explained her role. CSW shared the therapy recommendation. CSW explained that the patient has medicaid pending and that his SNF stay would be an LOG. CSW answered her questions. CSW informed her that she would contact her once they had located an LOG placement.   CSW will follow up with financial counselor on Monday to determine medicaid application status.   CSW will continue to follow and assist with disposition planning.                Expected Discharge Plan: Skilled Nursing Facility Barriers to Discharge: Continued Medical Work up, SNF Pending payor source - LOG   Patient Goals and CMS Choice Patient states their goals for this hospitalization and ongoing recovery are:: Pt mother is agreeable to rehab CMS Medicare.gov Compare Post Acute Care list provided to:: Patient Represenative (must comment) Choice offered to / list presented to : Parent  Expected Discharge Plan and Services Expected Discharge Plan: [place] In-house Referral: Clinical Social Work Discharge Planning Services: NA Post Acute Care Choice: [place] Living arrangements for the past 2 months: Apartment                 DME Arranged: N/A DME Agency: NA       HH Arranged: NA [place] Agency: NA        Prior Living Arrangements/Services Living arrangements for the past 2 months: Apartment Lives with:: Significant Other   Do you feel safe going back to the place where you live?: Yes      Need for Family Participation in Patient Care: Yes (Comment) Care giver support system in place?: Yes (comment) Current home services: DME     Activities of Daily Living Home Assistive Devices/Equipment: None ADL Screening (condition at time of admission) Patient's cognitive ability adequate to safely complete daily activities?: No Is the patient deaf or have difficulty hearing?: No Does the patient have difficulty seeing, even when wearing glasses/contacts?: No Does the patient have difficulty concentrating, remembering, or making decisions?: Yes Patient able to express need for assistance with ADLs?: No Does the patient have difficulty dressing or bathing?: Yes Independently performs ADLs?: No Communication: Dependent Is this a change from baseline?: Change from baseline, expected to last >3 days Dressing (OT): Dependent Is this a change from baseline?: Change from baseline, expected to last >3 days Grooming: Dependent Is this a change from baseline?: Change from baseline, expected to last >3 days Feeding: Dependent Is this a change from baseline?: Change from baseline, expected to last >3 days Bathing: Dependent Is this a change from baseline?: Change from baseline, expected to last >3 days Toileting: Dependent Is this a change from baseline?: Change from baseline, expected to last >3days In/Out Bed: Dependent Is this a change from baseline?: Change from baseline, expected to last >3 days Walks in Home: Dependent Is this a change from baseline?: Change from baseline, expected to last >3 days Does the patient have difficulty walking or climbing stairs?: Yes Weakness of Legs: Both Weakness of Arms/Hands: Left  Permission Sought/Granted Permission sought to share information with : Case Manager Permission granted to share information with : Yes,  Verbal Permission Granted     Permission granted to share info w AGENCY: All SNF        Emotional Assessment Appearance:: Appears stated age Attitude/Demeanor/Rapport: Unable to Assess Affect (typically observed): Unable to Assess Orientation: : (Disorientated  x4) Alcohol / Substance Use: Not Applicable Psych Involvement: No (comment)  Admission diagnosis:  Hemorrhagic stroke ([place]) [I61.9] Stroke (cerebrum) ([place]) [I63.9] Encounter for intubation [Z01.818] Subdural hematoma ([place]) [id number] Patient Active Problem List   Diagnosis Date Noted  . Acute respiratory failure ([place])   . Hemorrhagic stroke ([place])   . Cytotoxic brain edema ([place]) 09/10/2019  . ICH (intracerebral hemorrhage) ([place]) 09/09/2019  . Subdural hematoma ([place]) 09/09/2019  . Intracerebral hemorrhage ([place]) 09/09/2019   PCP:  Patient, [name] Pharmacy:   [id number] - [place], [address] [place] [address] Phone: [phone] Fax: [phone]     Social Determinants of Health (SDOH) Interventions    Readmission Risk Interventions No flowsheet data found.

## 2019-10-01 NOTE — Progress Notes (Signed)
Patient ID: Joshua [name], male   DOB: 1981-09-20, 38 y.o.   MRN: [id number] BP (!) 147/110 (BP Location: Left Arm)   Pulse (!) 114   Temp 97.8 F (36.6 C) (Axillary)   Resp 20   Ht 5\' 9"  (1.753 m)   Wt 60 kg   SpO2 100%   BMI 19.53 kg/m  Eyes open, not following commands Purposeful on the left side Eye deviation to the left No new recommendations, answered questions from sister.

## 2019-10-01 NOTE — Progress Notes (Addendum)
STROKE TEAM PROGRESS NOTE   INTERVAL HISTORY Patient sister is at bedside. Pt neuro stable. Continued global aphasia and right hemiplegia. I had long discussion with sister at bedside, updated pt current condition, treatment plan and potential prognosis, and answered all the questions. She expressed understanding and appreciation.     OBJECTIVE Vitals:   09/30/19 2025 09/30/19 2336 10/01/19 0414 10/01/19 0714  BP: (!) 116/101 (!) 137/108 (!) 104/92 (!) 151/107  Pulse: (!) 110 (!) 114 (!) 125 (!) 110  Resp: 18 19 19  (!) 21  Temp: 98.6 F (37 C) 98 F (36.7 C) 99 F (37.2 C) 99 F (37.2 C)  TempSrc: Oral Oral  Oral  SpO2: 100% 100% 100% 100%  Weight:      Height:        CBC:  Recent Labs  Lab 09/28/19 0440 10/01/19 0516  WBC 10.5 10.3  HGB 10.7* 10.7*  HCT 33.1* 33.5*  MCV 91.2 91.0  PLT 432* [id number]    Basic Metabolic Panel:  Recent Labs  Lab 09/29/19 0436 10/01/19 0516  NA 143 141  K 4.6 5.2*  CL 107 103  CO2 25 25  GLUCOSE 160* 112*  BUN 58* 65*  CREATININE 2.43* 2.44*  CALCIUM 9.8 10.1  MG  --  2.3  PHOS  --  6.3*    IMAGING Ct Head Code Stroke Wo Contrast 09/09/2019 1. 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  2. Intraventricular extension of hemorrhage. Small focus of intraparenchymal hemorrhage in the right basal ganglia.   CT head  CTA Head  09/10/2019 1. Status post left pterional craniectomy for evacuation of left basal ganglia hematoma. Residual hematoma with interspersed postoperative gas measures 6.1 x 2.9 cm. 2. Decreased midline shift, now measuring 6 mm to the right. 3. Unchanged punctate focus of hemorrhage at the posterior right insula. 4. Unchanged intraventricular blood. 5. No intracranial arterial occlusion or high-grade stenosis. No aneurysm or vascular malformation.   CT head  09/13/2019 Stable changes post left  frontoparietal craniectomy for evacuation of left-sided central hemorrhagic stroke. Residual without significant change measuring 2.8 x 6.5 cm with less air within the hemorrhage. Stable postsurgical change with mild edema/mass effect and stable midline shift to the right of 5-6 mm. Decrease in density 4 mm focus of hemorrhage over the right basal ganglia.  CT Head W/O Contrast 09/17/19 1. Unchanged size of intraparenchymal hematoma centered in the left basal ganglia with unchanged surrounding edema and mass effect. 2. Unchanged rightward midline shift, measuring 6 mm.  Renal US 09/13/2019 1. Mildly echogenic kidneys likely related to underlying medical renal disease. 2. Mild bilateral hydronephrosis.  No shadowing stone.  ECG - SR rate 97 BPM. LVH and prolonged QT interval. (See cardiology reading for complete details) Transthoracic Echocardiogram  09/16/2019 1. Left ventricular ejection fraction, by visual estimation, is 70 to 75%. The left ventricle has hyperdynamic function. Normal left ventricular size. There is severely increased left ventricular hypertrophy.  2. Global right ventricle has normal systolic function.The right ventricular size is normal. No increase in right ventricular wall thickness.  3. Left atrial size was normal.  4. Right atrial size was moderately dilated.  5. The mitral valve is grossly normal. Mild mitral valve regurgitation.  6. The tricuspid valve is grossly normal. Tricuspid valve regurgitation is trivial.  7. The aortic valve is tricuspid Aortic valve regurgitation was not visualized by color flow Doppler.  8. The pulmonic valve was grossly normal. Pulmonic valve regurgitation is not visualized by color flow Doppler.  9. The inferior vena cava was not able to be adequately assessed to estimate CVP.   PHYSICAL EXAM        Temp:  [97.8 F (36.6 C)-99 F (37.2 C)] 99 F (37.2 C) (10/04 0714) Pulse Rate:  [107-125] 110 (10/04 0714) Resp:  [16-21] 21 (10/04  0714) BP: (104-158)/(92-108) 151/107 (10/04 0714) SpO2:  [96 %-100 %] 100 % (10/04 0714)  General - Well nourished, well developed, lethargic.  Ophthalmologic - fundi not visualized due to noncooperation.  Cardiovascular - Regular rhythm and rate.  Neuro - awake, eyes open, global aphasia, not following commands. Eyes left gaze preference and barely cross midline. Blinking to visual threat on the left, not on the right. PERRL, tracking on the left. Neck left turning position, right facial droop. Tongue protrusion not cooperative. Spontaneous movement on the left UE and LE. No movement on RUE even with pain. RLE 2/5 on pain stimulation. Positive babinski and triple reflex on the right. Sensation, coordination and gait not tested.   ASSESSMENT/PLAN   [name] is a 38 y.o. male with history of admission for Joshua [name] poisoning presenting with a seizure, vomiting.   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 In addition, small focus of R basal ganglia ICH.   NSG - decompressive craniectomy and hematoma evacuation w/ flap in R abd - 09/09/19 Dr Vertell Limber  Code Stroke CT Head - Large intraparenchymal hematoma of the left basal ganglia measuring 7.8 x 4.8 cm with 14 mm of rightward midline shift and early subfalcine herniation. Intraventricular extension of hemorrhage. Small focus of intraparenchymal hemorrhage in the right basal ganglia.   CT/CTA head - 09/10/2019 - Status post left pterional craniectomy for evacuation of left basal ganglia hematoma. Residual hematoma with interspersed postoperative gas measures 6.1 x 2.9 cm. Decreased midline shift, now measuring 6 mm to the right. No intracranial arterial occlusion or high-grade stenosis. No aneurysm or vascular malformation  Ct head 9/16 and 9/20 stable crani. Stable 5-6 midline shift. Decreased R basal ganglia hemorrhage   HgbA1c 5.6  LDL  86  TTE 09/16/19 EF 70 to 75%. There is severely increased left ventricular hypertrophy.  [employer] Virus 2 - negative  UDS - negatived  VTE prophylaxis - Heparin 5000 units sq tid   No antithrombotic prior to admission, now on No antithrombotic d/t hemorrhage   Therapy recommendations: SNF  Disposition:  Pending  Cerebral Edema  S/p crani and hematoma evacuation  Treated with 3% saline protocol, now off  Na - 146->143->141  Now Free water 200 q4h   CT repeat 09/17/19 - Unchanged size of intraparenchymal hematoma and surrounding edema and mass effect.   PICC line out 9/25, for midline  Respiratory Failure  Secondary to [place]  Off sedation and extubated 9/18  Re-intubated 9/18 due to low O2 sat and not protect airway  Extubated 9/23, tolerating well  Off precedex, on seroquel 50 bid, clonidine taper  CCM signed off 9/29 /20  VAP vs Aspiration PNA (vomited PTA) Leukocytosis, Febrile - resolved  WBC - 10.5 ->10.3  Afebrile  U/A neg 9/14  Blood cultures normal flora  CXR progressive RLL atx/infilatrate -> CXR - 09/16/19 neg  D/c Vanc, d/c Zosyn -> Unasyn 9/17-9/20  Hypertensive Emergency  Probable undiagnosed / untreated hypertension (severe LVH on  ECG)  Home BP meds: none  Now on amlodipine 10, labetolol 300 tid    Hydralazine discontinued due to tachycardia  Off Lisinopril D/t increasing Cr  SBP goal < 160 mm Hg     Off cleviprex   BP stable   Tachycardia - ?? cause  d/c hydralazeine  Lab stable  Unclear source  On labetalol 300mg  tid  Seizure  On presentation  Hx of one seizure previously  On Keppra 500 mg Bid  AKI on CKD stage III  ACE stopped 9/14.   Renal US mild echogenic kidneys c/w CKD  Creatinine - 2.39->2.43->2.44  Continue TF and free water    Alcohol abuse  Hx Joshua [name] abuse  recent hospitalization for alcohol poisoning.   Drinks 1 pt [place] per day.   On FA/MVI/B1  On CIWA  protocol.  Hyperlipidemia  Home Lipid lowering medication: none  LDL - 86  Current lipid lowering medication: None  May consider statin at discharge  Dysphagia Malnutrition . Secondary to stroke . NPO  Has cortrak and tube feeding @ 53 on hold  Speech following - unable to clear for diet  PEG placed 10/1 by trauma surgeons   Anemia of critical illness  Hgb 14.5->...10.2->10.7  Unclear etiology  On PPI BID  Close CBC monitoring  Other Stroke Risk Factors  Polysubstance abuse, UDS - negative 09/09/19  Other Active Problems  U/A and culture -> U/A - negative ; Culture > 100k Klebsiella oxytoca. ancef 9/28, enteral keflex 9/30>>pt completed 3 day course of [facility] day # 22  Rosalin Hawking, MD PhD Stroke Neurology 10/01/2019 8:08 PM  To contact Stroke Continuity provider, please refer to [email or web address] After hours, contact General Neurology

## 2019-10-01 NOTE — Progress Notes (Signed)
PROGRESS NOTE        PATIENT DETAILS Name: Joshua [name] Age: 38 y.o. Sex: male Date of Birth: 06-18-1981 Admit Date: 09/09/2019 Admitting Physician Erline Levine, MD ZDG:[id number], [name]  Brief Narrative: Patient is a 38 y.o. male with history of HTN, seizure disorder, alcohol abuse-who with seizure and right-sided hemiplegia-upon further evaluation was found to have a large left intracranial hemorrhage.  Patient was intubated-admitted to the ICU.  Evaluated by neurosurgery-underwent left frontal temporal craniectomy and evacuation of the hematoma.  Upon further improvement-transferred to the triad hospitalist service on 9/30.  Significant events: 9/12>>Admitted/  operating room for left frontal temporal craniectomy and evacuation hematoma 9/18 extubated > reintubated due to protect airway and low oxygen 9/23 > Extubated  9/30>> transferred to triad hospitalist. 10/1>> PEG tube placement by CCS  Subjective: Discussed with RN-some mild intermittent tachycardia continues-but no major events overnight.  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  CTA head negative for aneurysm or vascular malformation.  Remains with dense right-sided hemiplegia and aphasia.  Patient is now s/p PEG tube placement.  CIR-appropriate for SNF-we will consult social work.  Aspiration pneumonia: Continue n.p.o. status-now on tube feedings-completed a course of antimicrobial therapy.  Due to severe dysphagia from CVA-remains at risk of aspiration-continue Robinul.  Have asked RN to always keep head end of the bed raised to more than 45 degrees.  Continue maximum aspiration  precautions.  Dysphagia: Secondary to CVA-underwent PEG tube placement on 10/1-tolerating PEG tube feedings.  AKI versus CKD stage IV: Suspect has underlying CKD stage III-probably from hypertensive nephrosclerosis.  Continue to follow electrolytes closely.  UA without proteinuria.  Renal ultrasound suggestive of chronic kidney disease.  .  EtOH abuse: No obvious signs of withdrawal-has completed a clonidine taper.  HTN: Stable-continue with Norvasc, labetalol.  Will follow and adjust accordingly.  Seizure: Continue Keppra  Nutrition Problem: Nutrition Problem: Inadequate oral intake Etiology: inability to eat Signs/Symptoms: NPO status Interventions: Refer to RD note for recommendations   Diet: Diet Order            Diet NPO time specified  Diet effective midnight               DVT Prophylaxis: Prophylactic Heparin   Code Status: Full code   Family Communication: None at bedside  Disposition Plan: Remain inpatient-SNF on discharge-not a CIR candidate.  Antimicrobial agents: Anti-infectives (From admission, onward)   Start     Dose/Rate Route Frequency Ordered Stop   09/27/19 1130  cephALEXin (KEFLEX) 250 MG/5ML suspension 500 mg     500 mg Per Tube Every 12 hours 09/27/19 1052 09/29/19 2250   09/25/19 1445  ceFAZolin (ANCEF) IVPB 2g/100 mL premix  Status:  Discontinued     2 g 200 mL/hr over 30 Minutes Intravenous Every 12 hours 09/25/19 1417 09/27/19 1052   09/14/19 1000  Ampicillin-Sulbactam (UNASYN) 3 g in sodium chloride 0.9 % 100 mL IVPB     3 g 200 mL/hr over 30 Minutes Intravenous Every 8 hours 09/14/19 0922 09/17/19 1816   09/13/19 1100  vancomycin (VANCOCIN) IVPB 750 mg/150  ml premix  Status:  Discontinued     750 mg 150 mL/hr over 60 Minutes Intravenous Every 24 hours 09/13/19 0728 09/13/19 0838   09/12/19 1100  vancomycin (VANCOCIN) IVPB 1000 mg/200 mL premix  Status:  Discontinued     1,000 mg 200 mL/hr over 60 Minutes Intravenous Every 24 hours  09/11/19 1034 09/13/19 0728   09/11/19 1130  piperacillin-tazobactam (ZOSYN) IVPB 3.375 g  Status:  Discontinued     3.375 g 12.5 mL/hr over 240 Minutes Intravenous Every 8 hours 09/11/19 1054 09/14/19 0845   09/11/19 1100  vancomycin (VANCOCIN) 1,500 mg in sodium chloride 0.9 % 500 mL IVPB     1,500 mg 250 mL/hr over 120 Minutes Intravenous  Once 09/11/19 1034 09/11/19 1900   09/11/19 1015  piperacillin-tazobactam (ZOSYN) IVPB 4.5 g  Status:  Discontinued     4.5 g 200 mL/hr over 30 Minutes Intravenous Every 8 hours 09/11/19 1010 09/11/19 1052   09/09/19 0845  ceFAZolin (ANCEF) IVPB 2g/100 mL premix     2 g 200 mL/hr over 30 Minutes Intravenous Every 8 hours 09/09/19 0842 09/09/19 1658      Procedures:  left frontal temporal craniectomy and evacuation hematoma>> 9/12 ETT > 9/12- 9/18 re-intubated 9/18-9/23 Right radial A-line 9/12 > 9/15 L cortrak 9/14 >> Right Brachial PICC 9/14 >> 9/19 10/1>> PEG tube placement by CCS  CONSULTS:  pulmonary/intensive care, neurology and Neurosurgery  Time spent: 15 minutes-Greater than 50% of this time was spent in counseling, explanation of diagnosis, planning of further management, and coordination of care.  MEDICATIONS: Scheduled Meds:  alteplase  2 mg Intracatheter Once   amLODipine  10 mg Per Tube Daily   chlorhexidine gluconate (MEDLINE KIT)  15 mL Mouth Rinse BID   Chlorhexidine Gluconate Cloth  6 each Topical Daily   folic acid  1 mg Per Tube Daily   free water  200 mL Per Tube Q4H   glycopyrrolate  0.1 mg Intravenous TID   heparin injection (subcutaneous)  5,000 Units Subcutaneous Q8H   insulin aspart  0-15 Units Subcutaneous Q4H   labetalol  300 mg Per Tube TID   levETIRAcetam  500 mg Per Tube BID   lipase/protease/amylase)  20,880 Units Per Tube Once   And   sodium bicarbonate  650 mg Per Tube Once   mouth rinse  15 mL Mouth Rinse 10 times per day   multivitamin  15 mL Per Tube Daily   pantoprazole sodium   40 mg Per Tube BID   QUEtiapine  50 mg Per Tube BID   sodium chloride flush  10-40 mL Intracatheter Q12H   sodium polystyrene  30 g Per Tube Once   thiamine  100 mg Per Tube Daily   Continuous Infusions:  feeding supplement (OSMOLITE 1.5 CAL) 1,000 mL (10/01/19 0448)   PRN Meds:.acetaminophen **OR** acetaminophen, bisacodyl, hydrALAZINE, labetalol, [DISCONTINUED] ondansetron **OR** ondansetron (ZOFRAN) IV, polyethylene glycol, promethazine, sodium chloride flush, traMADol   PHYSICAL EXAM: Vital signs: Vitals:   09/30/19 2336 10/01/19 0414 10/01/19 0714 10/01/19 0748  BP: (!) 137/108 (!) 104/92 (!) 151/107 (!) 147/110  Pulse: (!) 114 (!) 125 (!) 110 (!) 114  Resp: 19 19 (!) 21 20  Temp: 98 F (36.7 C) 99 F (37.2 C) 99 F (37.2 C) 97.8 F (36.6 C)  TempSrc: Oral  Oral Axillary  SpO2: 100% 100% 100% 100%  Weight:      Height:       Filed Weights   09/27/19 0500 09/28/19 0333  09/29/19 0109  Weight: 60.8 kg 61.3 kg 60 kg   Body mass index is 19.53 kg/m.   Gen Exam:Alert and aphasic-really does not respond to either verbal or physical commands. HEENT:atraumatic, normocephalic Chest: B/L clear to auscultation anteriorly CVS:S1S2 regular Abdomen:soft non tender, non distended Extremities:no edema Neurology: Right-sided hemiplegia Skin: no rash I have personally reviewed following labs and imaging studies  LABORATORY DATA: CBC: Recent Labs  Lab 09/28/19 0440 10/01/19 0516  WBC 10.5 10.3  HGB 10.7* 10.7*  HCT 33.1* 33.5*  MCV 91.2 91.0  PLT 432* 371    Basic Metabolic Panel: Recent Labs  Lab 09/26/19 0801 09/28/19 0440 09/29/19 0436 10/01/19 0516  NA 144 144 143 141  K 4.8 5.0 4.6 5.2*  CL 109 110 107 103  CO2 _0 GLUCOSE 121* 115* 160* 112*  BUN 63* 58* 58* 65*  CREATININE 2.39* 2.28* 2.43* 2.44*  CALCIUM 9.6 10.1 9.8 10.1  MG  --   --   --  2.3  PHOS  --   --   --  6.3*    GFR: Estimated Creatinine Clearance: 34.5 mL/min (A) (by  C-G formula based on SCr of 2.44 mg/dL (H)).  Liver Function Tests: No results for input(s): AST, ALT, ALKPHOS, BILITOT, PROT, ALBUMIN in the last 168 hours. No results for input(s): LIPASE, AMYLASE in the last 168 hours. No results for input(s): AMMONIA in the last 168 hours.  Coagulation Profile: No results for input(s): INR, PROTIME in the last 168 hours.  Cardiac Enzymes: No results for input(s): CKTOTAL, CKMB, CKMBINDEX, TROPONINI in the last 168 hours.  BNP (last 3 results) No results for input(s): PROBNP in the last 8760 hours.  HbA1C: No results for input(s): HGBA1C in the last 72 hours.  CBG: Recent Labs  Lab 09/30/19 0807 09/30/19 1652 09/30/19 2337 10/01/19 0416 10/01/19 0747  GLUCAP 119* 155* 131* 132* 146*    Lipid Profile: No results for input(s): CHOL, HDL, LDLCALC, TRIG, CHOLHDL, LDLDIRECT in the last 72 hours.  Thyroid Function Tests: No results for input(s): TSH, T4TOTAL, FREET4, T3FREE, THYROIDAB in the last 72 hours.  Anemia Panel: No results for input(s): VITAMINB12, FOLATE, FERRITIN, TIBC, IRON, RETICCTPCT in the last 72 hours.  Urine analysis:    Component Value Date/Time   COLORURINE YELLOW (A) 09/23/2019 1108   APPEARANCEUR CLEAR (A) 09/23/2019 1108   LABSPEC 1.015 09/23/2019 1108   PHURINE 7.5 09/23/2019 1108   GLUCOSEU NEGATIVE 09/23/2019 1108   HGBUR NEGATIVE 09/23/2019 1108   [facility] 09/23/2019 1108   [facility] 09/23/2019 1108   PROTEINUR NEGATIVE 09/23/2019 1108   UROBILINOGEN 1.0 04/05/2009 0021   NITRITE NEGATIVE 09/23/2019 1108   LEUKOCYTESUR NEGATIVE 09/23/2019 1108    Sepsis Labs: Lactic Acid, Venous No results found for: LATICACIDVEN  MICROBIOLOGY: Recent Results (from the past 240 hour(s))  Urine Culture     Status: Abnormal   Collection Time: 09/23/19 10:01 AM   Specimen: Urine, Random  Result Value Ref Range Status   Specimen Description URINE, RANDOM  Final   Special Requests   Final     NONE Performed at [facility], [place] [address] [place], [place] [address]    Culture >=100,000 COLONIES/mL KLEBSIELLA OXYTOCA (A)  Final   Report Status 09/25/2019 FINAL  Final   Organism ID, Bacteria KLEBSIELLA OXYTOCA (A)  Final      Susceptibility   Klebsiella oxytoca - MIC*    AMPICILLIN >=32 RESISTANT Resistant  CEFAZOLIN <=4 SENSITIVE Sensitive     CEFTRIAXONE <=1 SENSITIVE Sensitive     CIPROFLOXACIN <=0.25 SENSITIVE Sensitive     GENTAMICIN <=1 SENSITIVE Sensitive     IMIPENEM <=0.25 SENSITIVE Sensitive     NITROFURANTOIN <=16 SENSITIVE Sensitive     TRIMETH/SULFA <=20 SENSITIVE Sensitive     AMPICILLIN/SULBACTAM >=32 RESISTANT Resistant     PIP/TAZO >=128 RESISTANT Resistant     Extended ESBL NEGATIVE Sensitive     * >=100,000 COLONIES/mL KLEBSIELLA OXYTOCA    RADIOLOGY STUDIES/RESULTS: Ct Angio Head W Or Wo Contrast  Result Date: 09/10/2019 CLINICAL DATA:  Intracranial hemorrhage follow up EXAM: CT HEAD WITHOUT CONTRAST CT ANGIOGRAPHY OF THE HEAD TECHNIQUE: Contiguous axial images were obtained from the base of the skull through the vertex without intravenous contrast. Multidetector CT imaging of the head was performed using the standard protocol during bolus administration of intravenous contrast. Multiplanar CT image reconstructions and MIPs were obtained to evaluate the vascular anatomy. CONTRAST:  67m OMNIPAQUE IOHEXOL 350 MG/ML SOLN COMPARISON:  Head CT 09/09/2019 FINDINGS: CT HEAD FINDINGS Brain: Status post hematoma evacuation. Residual hematoma with interspersed postoperative gas measures 6.1 x 2.9 cm, centered in the left basal ganglia. Entrapment of the right lateral ventricle temporal horn has resolved. Volume a blood within the lateral ventricles is unchanged. Punctate focus of hemorrhage at the posterior right insula is unchanged. There is now extra-axial blood over the left convexity underlying the craniectomy site. Midline shift has improved, now measuring  6 mm to the right. Skull: Left pterional craniectomy Sinuses/Orbits: Negative CTA HEAD FINDINGS POSTERIOR CIRCULATION: --Vertebral arteries: Normal V4 segments. --Posterior inferior cerebellar arteries (PICA): Patent origins from the vertebral arteries. --Anterior inferior cerebellar arteries (AICA): Patent origins from the basilar artery. --Basilar artery: Normal. --Superior cerebellar arteries: Normal. --Posterior cerebral arteries: Normal. Both originate from the basilar artery. Posterior communicating arteries (p-comm) are diminutive or absent. ANTERIOR CIRCULATION: --Intracranial internal carotid arteries: Normal. --Anterior cerebral arteries (ACA): Normal. Both A1 segments are present. Patent anterior communicating artery (a-comm). --Middle cerebral arteries (MCA): Normal. VENOUS SINUSES: As permitted by contrast timing, patent. ANATOMIC VARIANTS: None Review of the MIP images confirms the above findings. IMPRESSION: 1. Status post left pterional craniectomy for evacuation of left basal ganglia hematoma. Residual hematoma with interspersed postoperative gas measures 6.1 x 2.9 cm. 2. Decreased midline shift, now measuring 6 mm to the right. 3. Unchanged punctate focus of hemorrhage at the posterior right insula. 4. Unchanged intraventricular blood. 5. No intracranial arterial occlusion or high-grade stenosis. No aneurysm or vascular malformation. Electronically Signed   By: KUlyses JarredM.D.   On: 09/10/2019 05:58   Ct Head Wo Contrast  Result Date: 09/17/2019 CLINICAL DATA:  Intracranial hemorrhage follow up EXAM: CT HEAD WITHOUT CONTRAST TECHNIQUE: Contiguous axial images were obtained from the base of the skull through the vertex without intravenous contrast. COMPARISON:  Head CT 09/13/2019 FINDINGS: Brain: The size of the intraparenchymal hematoma centered in the left basal ganglia is unchanged. Surrounding edema and postsurgical changes also remain the same. There is a small amount of blood within  both occipital horns. Rightward midline shift measures 6 mm at the level of the foramina Monro, unchanged. No new site of hemorrhage. Vascular: No abnormal hyperdensity of the major intracranial arteries or dural venous sinuses. No intracranial atherosclerosis. Skull: Left pterional craniectomy with soft tissue swelling overlying. Sinuses/Orbits: No fluid levels or advanced mucosal thickening of the visualized paranasal sinuses. No mastoid or middle ear effusion. The orbits are normal. IMPRESSION: 1.  Unchanged size of intraparenchymal hematoma centered in the left basal ganglia with unchanged surrounding edema and mass effect. 2. Unchanged rightward midline shift, measuring 6 mm. Electronically Signed   By: Ulyses Jarred M.D.   On: 09/17/2019 01:46   Ct Head Wo Contrast  Result Date: 09/13/2019 CLINICAL DATA:  Stroke follow-up. EXAM: CT HEAD WITHOUT CONTRAST TECHNIQUE: Contiguous axial images were obtained from the base of the skull through the vertex without intravenous contrast. COMPARISON:  09/10/2019 and 09/09/2019 FINDINGS: Brain: Evidence of patient's recent evacuation of central left-sided hemorrhagic stroke. Residual hemorrhage over the left basal ganglia measures approximately 2.8 x 6.5 cm without significant change. There is less air within the central aspect of the hemorrhage. Stable adjacent postsurgical change, edema and minimal mass effect. Stable mild midline shift to the right of 5-6 mm. Persistent 4 mm hemorrhagic focus over the right basal ganglia is becoming less dense. Vascular: No hyperdense vessel or unexpected calcification. Skull: Evidence of recent right frontal parietal craniectomy. Skin staples over the left scalp. Sinuses/Orbits: Orbits are normal. Paranasal sinuses are well aerated. Other: Periodontal disease. IMPRESSION: Stable changes post left frontoparietal craniectomy for evacuation of left-sided central hemorrhagic stroke. Residual without significant change measuring 2.8 x 6.5  cm with less air within the hemorrhage. Stable postsurgical change with mild edema/mass effect and stable midline shift to the right of 5-6 mm. Decrease in density 4 mm focus of hemorrhage over the right basal ganglia. Electronically Signed   By: Marin Olp M.D.   On: 09/13/2019 08:24   Ct Head Wo Contrast  Result Date: 09/10/2019 CLINICAL DATA:  Intracranial hemorrhage follow up EXAM: CT HEAD WITHOUT CONTRAST CT ANGIOGRAPHY OF THE HEAD TECHNIQUE: Contiguous axial images were obtained from the base of the skull through the vertex without intravenous contrast. Multidetector CT imaging of the head was performed using the standard protocol during bolus administration of intravenous contrast. Multiplanar CT image reconstructions and MIPs were obtained to evaluate the vascular anatomy. CONTRAST:  12m OMNIPAQUE IOHEXOL 350 MG/ML SOLN COMPARISON:  Head CT 09/09/2019 FINDINGS: CT HEAD FINDINGS Brain: Status post hematoma evacuation. Residual hematoma with interspersed postoperative gas measures 6.1 x 2.9 cm, centered in the left basal ganglia. Entrapment of the right lateral ventricle temporal horn has resolved. Volume a blood within the lateral ventricles is unchanged. Punctate focus of hemorrhage at the posterior right insula is unchanged. There is now extra-axial blood over the left convexity underlying the craniectomy site. Midline shift has improved, now measuring 6 mm to the right. Skull: Left pterional craniectomy Sinuses/Orbits: Negative CTA HEAD FINDINGS POSTERIOR CIRCULATION: --Vertebral arteries: Normal V4 segments. --Posterior inferior cerebellar arteries (PICA): Patent origins from the vertebral arteries. --Anterior inferior cerebellar arteries (AICA): Patent origins from the basilar artery. --Basilar artery: Normal. --Superior cerebellar arteries: Normal. --Posterior cerebral arteries: Normal. Both originate from the basilar artery. Posterior communicating arteries (p-comm) are diminutive or absent.  ANTERIOR CIRCULATION: --Intracranial internal carotid arteries: Normal. --Anterior cerebral arteries (ACA): Normal. Both A1 segments are present. Patent anterior communicating artery (a-comm). --Middle cerebral arteries (MCA): Normal. VENOUS SINUSES: As permitted by contrast timing, patent. ANATOMIC VARIANTS: None Review of the MIP images confirms the above findings. IMPRESSION: 1. Status post left pterional craniectomy for evacuation of left basal ganglia hematoma. Residual hematoma with interspersed postoperative gas measures 6.1 x 2.9 cm. 2. Decreased midline shift, now measuring 6 mm to the right. 3. Unchanged punctate focus of hemorrhage at the posterior right insula. 4. Unchanged intraventricular blood. 5. No intracranial arterial occlusion or high-grade  stenosis. No aneurysm or vascular malformation. Electronically Signed   By: Ulyses Jarred M.D.   On: 09/10/2019 05:58   US Renal  Result Date: 09/13/2019 CLINICAL DATA:  38 year old male with acute renal insufficiency. EXAM: RENAL / URINARY TRACT ULTRASOUND COMPLETE COMPARISON:  None. FINDINGS: Evaluation is somewhat limited due to inability of the patient to cooperate with the exam. Right Kidney: Renal measurements: 10.9 x 4.9 x 6.4 cm = volume: 179 mL. There is increased renal parenchymal echogenicity. There is mild hydronephrosis of the right kidney. No shadowing stone. Left Kidney: Renal measurements: 9.8 x 4.8 x 5.9 cm = volume: 145 mL. There is increased renal parenchymal echogenicity. Mild hydronephrosis. No shadowing stone. Bladder: The urinary bladder is distended otherwise unremarkable. Ureteral jets are not visualized. IMPRESSION: 1. Mildly echogenic kidneys likely related to underlying medical renal disease. 2. Mild bilateral hydronephrosis.  No shadowing stone. Electronically Signed   By: Anner Crete M.D.   On: 09/13/2019 11:41   Dg Chest Port 1 View  Result Date: 09/23/2019 CLINICAL DATA:  Fever EXAM: PORTABLE CHEST 1 VIEW  COMPARISON:  09/18/2019 FINDINGS: There has been interval endotracheal extubation. There is no acute appearing airspace opacity. Cardiomegaly. Partially imaged enteric feeding tube. IMPRESSION: 1. There has been interval endotracheal extubation. There is no acute appearing airspace opacity. 2.  Cardiomegaly. Electronically Signed   By: Eddie Candle M.D.   On: 09/23/2019 13:18   Dg Chest Port 1 View  Result Date: 09/18/2019 CLINICAL DATA:  Intubation.  Seizure.  Head injury. EXAM: PORTABLE CHEST 1 VIEW COMPARISON:  09/17/2019. FINDINGS: Endotracheal tube, feeding tube in stable position position. PICC line tip noted over the right atrium. Stable cardiomegaly. Low lung volumes with right base atelectasis/infiltrate. No pleural effusion or pneumothorax. IMPRESSION: 1. Endotracheal tube, feeding tube in stable position. PICC line tip noted over the right atrium. 2.  Stable cardiomegaly. 3.  Low lung volumes with right base atelectasis/infiltrate. Electronically Signed   By: Marcello Moores  Register   On: 09/18/2019 06:54   Dg Chest Port 1 View  Result Date: 09/17/2019 CLINICAL DATA:  Respiratory failure. Follow-up exam. Intubated patient. EXAM: PORTABLE CHEST 1 VIEW COMPARISON:  09/16/2019 and exams. FINDINGS: Basilar atelectasis has improved.  Lungs now clear. No pneumothorax or pleural effusion. Endotracheal tube, enteric tube and left PICC are stable. IMPRESSION: 1. Lungs now clear. 2. No new abnormalities. 3. Stable support apparatus. Electronically Signed   By: Lajean Manes M.D.   On: 09/17/2019 06:35   Dg Chest Port 1 View  Result Date: 09/16/2019 CLINICAL DATA:  Acute respiratory failure.  Follow-up exam. EXAM: PORTABLE CHEST 1 VIEW COMPARISON:  09/15/2019 and earlier exams. FINDINGS: Cardiac silhouette mildly enlarged. Atelectasis noted at the right lung base on the prior study has improved. Remainder of the lungs is clear. Endotracheal tube, left-sided PICC and enteric feeding tube are stable. IMPRESSION:  1. No acute cardiopulmonary disease. Right lung base atelectasis has improved. 2. Support apparatus is stable and well positioned. Electronically Signed   By: Lajean Manes M.D.   On: 09/16/2019 11:36   Dg Chest Port 1 View  Result Date: 09/15/2019 CLINICAL DATA:  Respiratory failure. EXAM: PORTABLE CHEST 1 VIEW COMPARISON:  Chest x-ray 09/10/2019. FINDINGS: Endotracheal tube noted with tip 3 cm above the carina. Feeding tube noted with tip below left hemidiaphragm. Left PICC line noted with tip at cavoatrial junction. Stable cardiomegaly. Progressive right lower lobe atelectasis/infiltrate. No pleural effusion or pneumothorax. IMPRESSION: 1.  Lines and tubes in good anatomic position.  2.  Progressive right lower lobe atelectasis/infiltrate. Electronically Signed   By: Marcello Moores  Register   On: 09/15/2019 08:14   Dg Chest Port 1 View  Result Date: 09/10/2019 CLINICAL DATA:  Endotracheal tube placement. EXAM: PORTABLE CHEST 1 VIEW COMPARISON:  09/09/2019 FINDINGS: Endotracheal tube has tip 4.1 cm above the carina. Enteric tube courses into the stomach and off the film as tip is not visualized. Lungs are adequately inflated with mild stable elevation of the right hemidiaphragm. No focal lobar consolidation or effusion. Previous seen hazy density over the right midlung is improved to nearly resolved likely resolving atelectasis. Cardiomediastinal silhouette and remainder of the exam is unchanged. IMPRESSION: Resolving right midlung atelectasis. Tubes and lines as described. Electronically Signed   By: Marin Olp M.D.   On: 09/10/2019 08:41   Dg Chest Port 1 View  Result Date: 09/09/2019 CLINICAL DATA:  Respiratory failure. EXAM: PORTABLE CHEST 1 VIEW COMPARISON:  09/09/2019 FINDINGS: Endotracheal tube tip 5 cm above the carina. Enteric tube courses into the stomach and off the film as tip is not visualized. Lungs are adequately inflated with mild elevation of the right hemidiaphragm. There is new hazy  airspace opacification over the right mid to lower lung likely atelectasis. Left lung is clear. Cardiomediastinal silhouette and remainder of the exam is unchanged. IMPRESSION: New hazy opacification over the right midlung likely atelectasis. Tubes and lines as described. Electronically Signed   By: Marin Olp M.D.   On: 09/09/2019 11:22   Dg Chest Port 1 View  Result Date: 09/09/2019 CLINICAL DATA:  ET and OG placement EXAM: PORTABLE CHEST 1 VIEW COMPARISON:  None. FINDINGS: Transesophageal tube tip and side port distal to the GE junction terminating below the level of imaging. Endotracheal tube tip terminates appropriately in the mid trachea, approximately 5 cm from the carina. Additional support devices overlie the chest. No consolidation, features of edema, pneumothorax, or effusion. Pulmonary vascularity is normally distributed. The cardiomediastinal contours are unremarkable. No acute osseous or soft tissue abnormality. IMPRESSION: No acute cardiopulmonary abnormality. Appropriate positioning of the endotracheal and transesophageal tubes. Electronically Signed   By: Lovena Le M.D.   On: 09/09/2019 03:30   Dg Chest Port 1v Same Day  Result Date: 09/28/2019 CLINICAL DATA:  Short of breath.  Code stroke EXAM: PORTABLE CHEST 1 VIEW COMPARISON:  09/23/2019 FINDINGS: Cardiac silhouette borderline enlarged. No mediastinal or hilar masses or evidence of adenopathy. Clear lungs.  No pleural effusion or pneumothorax. Skeletal structures are grossly intact. IMPRESSION: No acute cardiopulmonary disease. Electronically Signed   By: Lajean Manes M.D.   On: 09/28/2019 19:34   Dg Abd Portable 1v  Result Date: 09/21/2019 CLINICAL DATA:  Feeding tube placement EXAM: PORTABLE ABDOMEN - 1 VIEW COMPARISON:  None. FINDINGS: Feeding tube coils in the mid stomach. Nonobstructive bowel gas pattern. IMPRESSION: Feeding tube coils in the mid stomach. Electronically Signed   By: Rolm Baptise M.D.   On: 09/21/2019 21:11    Ct Head Code Stroke Wo Contrast  Result Date: 09/09/2019 CLINICAL DATA:  Code stroke.  Left-sided weakness EXAM: CT HEAD WITHOUT CONTRAST TECHNIQUE: Contiguous axial images were obtained from the base of the skull through the vertex without intravenous contrast. COMPARISON:  None. FINDINGS: Brain: There is an intraparenchymal hematoma centered in the left basal ganglia measuring 7.8 x 4.8 cm. There is rightward midline shift that measures 1.4 cm. There is early subfalcine herniation. Hemorrhage has extended into the lateral ventricles and third ventricle. There is a small focus of  intraparenchymal hemorrhage at the right basal ganglia measuring approximately 4 mm. There is right ventricular entrapment with early dilatation of the temporal horn. Basal cisterns are effaced. Vascular: No hyperdense vessel or unexpected calcification. Skull: Normal. Negative for fracture or focal lesion. Sinuses/Orbits: No acute finding. Other: None. ASPECTS [facility] Stroke Program Early CT Score) is not applicable in the case of acute hemorrhage. IMPRESSION: 1. 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 2. Intraventricular extension of hemorrhage. Small focus of intraparenchymal hemorrhage in the right basal ganglia. Critical Value/emergent results were called by telephone at the time of interpretation on 09/09/2019 at 3:21 am to provider Dr. Kerney Elbe, Who verbally acknowledged these results. Electronically Signed   By: Ulyses Jarred M.D.   On: 09/09/2019 03:42   [place] Ekg Site Rite  Result Date: 09/11/2019 If Site Rite image not attached, placement could not be confirmed due to current cardiac rhythm.    LOS: 22 days   Oren Binet, MD  [facility]  If 7PM-7AM, please contact night-coverage  Please page via [email or web address]  Go to [email or web address] and use [facility] universal password to access. If you  do not have the password, please contact the hospital operator.  Locate the [facility] provider you are looking for under [facility] and page to a number that you can be directly reached. If you still have difficulty reaching the provider, please page the [facility] (Director on Call) for the Hospitalists listed on amion for assistance.  10/01/2019, 10:26 AM

## 2019-10-02 DIAGNOSIS — G8191 Hemiplegia, unspecified affecting right dominant side: Secondary | ICD-10-CM

## 2019-10-02 DIAGNOSIS — R4701 Aphasia: Secondary | ICD-10-CM

## 2019-10-02 LAB — GLUCOSE, CAPILLARY
Glucose-Capillary: 107 mg/dL — ABNORMAL HIGH (ref 70–99)
Glucose-Capillary: 110 mg/dL — ABNORMAL HIGH (ref 70–99)
Glucose-Capillary: 113 mg/dL — ABNORMAL HIGH (ref 70–99)
Glucose-Capillary: 120 mg/dL — ABNORMAL HIGH (ref 70–99)
Glucose-Capillary: 122 mg/dL — ABNORMAL HIGH (ref 70–99)
Glucose-Capillary: 128 mg/dL — ABNORMAL HIGH (ref 70–99)
Glucose-Capillary: 130 mg/dL — ABNORMAL HIGH (ref 70–99)
Glucose-Capillary: 135 mg/dL — ABNORMAL HIGH (ref 70–99)
Glucose-Capillary: 135 mg/dL — ABNORMAL HIGH (ref 70–99)
Glucose-Capillary: 142 mg/dL — ABNORMAL HIGH (ref 70–99)
Glucose-Capillary: 144 mg/dL — ABNORMAL HIGH (ref 70–99)
Glucose-Capillary: 151 mg/dL — ABNORMAL HIGH (ref 70–99)
Glucose-Capillary: 93 mg/dL (ref 70–99)

## 2019-10-02 LAB — BASIC METABOLIC PANEL
Anion gap: 12 (ref 5–15)
BUN: 10 mg/dL (ref 6–20)
CO2: 21 mmol/L — ABNORMAL LOW (ref 22–32)
Calcium: 9.1 mg/dL (ref 8.9–10.3)
Chloride: 103 mmol/L (ref 98–111)
Creatinine, Ser: 1.29 mg/dL — ABNORMAL HIGH (ref 0.61–1.24)
GFR calc Af Amer: >60 mL/min (ref >60)
GFR calc non Af Amer: >60 mL/min (ref >60)
Glucose, Bld: 111 mg/dL — ABNORMAL HIGH (ref 70–99)
Potassium: 4.1 mmol/L (ref 3.5–5.1)
Sodium: 136 mmol/L (ref 135–145)

## 2019-10-02 NOTE — Progress Notes (Signed)
STROKE TEAM PROGRESS NOTE   INTERVAL HISTORY Patient sister at bedside, patient neuro no significant change.  OBJECTIVE Vitals:   10/02/19 0418 10/02/19 0759 10/02/19 0854 10/02/19 1116  BP: (!) 137/104 (!) 153/109  (!) 137/103  Pulse: (!) 101 (!) 104 97 (!) 113  Resp: 18 20  20   Temp: 98.6 F (37 C) 99.1 F (37.3 C)  98.9 F (37.2 C)  TempSrc: Axillary Oral  Oral  SpO2: 97% 100%  100%  Weight:      Height:        CBC:  Recent Labs  Lab 09/28/19 0440 10/01/19 0516  WBC 10.5 10.3  HGB 10.7* 10.7*  HCT 33.1* 33.5*  MCV 91.2 91.0  PLT 432* [id number]    Basic Metabolic Panel:  Recent Labs  Lab 10/01/19 0516 10/02/19 0555  NA 141 136  K 5.2* 4.1  CL 103 103  CO2 25 21*  GLUCOSE 112* 111*  BUN 65* 10  CREATININE 2.44* 1.29*  CALCIUM 10.1 9.1  MG 2.3  --   PHOS 6.3*  --     IMAGING Ct Head Code Stroke Wo Contrast 09/09/2019 1. 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  2. Intraventricular extension of hemorrhage. Small focus of intraparenchymal hemorrhage in the right basal ganglia.   CT head  CTA Head  09/10/2019 1. Status post left pterional craniectomy for evacuation of left basal ganglia hematoma. Residual hematoma with interspersed postoperative gas measures 6.1 x 2.9 cm. 2. Decreased midline shift, now measuring 6 mm to the right. 3. Unchanged punctate focus of hemorrhage at the posterior right insula. 4. Unchanged intraventricular blood. 5. No intracranial arterial occlusion or high-grade stenosis. No aneurysm or vascular malformation.   CT head  09/13/2019 Stable changes post left frontoparietal craniectomy for evacuation of left-sided central hemorrhagic stroke. Residual without significant change measuring 2.8 x 6.5 cm with less air within the hemorrhage. Stable postsurgical change with mild edema/mass effect and stable midline shift to the right  of 5-6 mm. Decrease in density 4 mm focus of hemorrhage over the right basal ganglia.  CT Head W/O Contrast 09/17/19 1. Unchanged size of intraparenchymal hematoma centered in the left basal ganglia with unchanged surrounding edema and mass effect. 2. Unchanged rightward midline shift, measuring 6 mm.  Renal US 09/13/2019 1. Mildly echogenic kidneys likely related to underlying medical renal disease. 2. Mild bilateral hydronephrosis.  No shadowing stone.  ECG - SR rate 97 BPM. LVH and prolonged QT interval. (See cardiology reading for complete details) Transthoracic Echocardiogram  09/16/2019 1. Left ventricular ejection fraction, by visual estimation, is 70 to 75%. The left ventricle has hyperdynamic function. Normal left ventricular size. There is severely increased left ventricular hypertrophy.  2. Global right ventricle has normal systolic function.The right ventricular size is normal. No increase in right ventricular wall thickness.  3. Left atrial size was normal.  4. Right atrial size was moderately dilated.  5. The mitral valve is grossly normal. Mild mitral valve regurgitation.  6. The tricuspid valve is grossly normal. Tricuspid valve regurgitation is trivial.  7. The aortic valve is tricuspid Aortic valve regurgitation was not visualized by color flow Doppler.  8. The pulmonic valve was grossly normal. Pulmonic valve regurgitation is not visualized by color flow Doppler.  9. The inferior vena cava was not able to be adequately assessed to estimate CVP.   PHYSICAL EXAM Temp:  [98.3  F (36.8 C)-99.1 F (37.3 C)] 98.9 F (37.2 C) (10/05 1116) Pulse Rate:  [97-124] 113 (10/05 1116) Resp:  [18-20] 20 (10/05 1116) BP: (98-153)/(82-113) 137/103 (10/05 1116) SpO2:  [95 %-100 %] 100 % (10/05 1116)  General - Well nourished, well developed, lethargic.  Ophthalmologic - fundi not visualized due to noncooperation.  Cardiovascular - Regular rhythm but tachycardia.  Neuro -  awake, eyes open, global aphasia, not following commands. Eyes left gaze preference and barely cross midline. Blinking to visual threat on the left, not on the right. PERRL, tracking on the left. Neck left turning position, right facial droop. Tongue protrusion not cooperative. Spontaneous movement on the left UE and LE. No movement on RUE even with pain. RLE 2/5 on pain stimulation. Positive babinski and triple reflex on the right. Sensation, coordination and gait not tested.   ASSESSMENT/PLAN   Mr. Yoshimi Cuttler is a 38 y.o. male with history of admission for Etoh poisoning presenting with a seizure, vomiting.   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 In addition, small focus of R basal ganglia ICH.   NSG - decompressive craniectomy and hematoma evacuation w/ flap in R abd - 09/09/19 Dr Vertell Limber  Code Stroke CT Head - Large intraparenchymal hematoma of the left basal ganglia measuring 7.8 x 4.8 cm with 14 mm of rightward midline shift and early subfalcine herniation. Intraventricular extension of hemorrhage. Small focus of intraparenchymal hemorrhage in the right basal ganglia.   CT/CTA head - 09/10/2019 - Status post left pterional craniectomy for evacuation of left basal ganglia hematoma. Residual hematoma with interspersed postoperative gas measures 6.1 x 2.9 cm. Decreased midline shift, now measuring 6 mm to the right. No intracranial arterial occlusion or high-grade stenosis. No aneurysm or vascular malformation  Ct head 9/16 and 9/20 stable crani. Stable 5-6 midline shift. Decreased R basal ganglia hemorrhage   HgbA1c 5.6  LDL 86  TTE 09/16/19 EF 70 to 75%. There is severely increased left ventricular hypertrophy.  [employer] Virus 2 - negative  UDS - negatived  VTE prophylaxis - Heparin 5000 units sq tid   No antithrombotic prior to admission, now on No antithrombotic d/t hemorrhage    Therapy recommendations: SNF  Disposition:  Pending  Cerebral Edema  S/p crani and hematoma evacuation  Treated with 3% saline protocol, now off  Na - 146->143->141 -> 136   Now Free water 200 q4h   CT repeat 09/17/19 - Unchanged size of intraparenchymal hematoma and surrounding edema and mass effect.   PICC line out 9/25, for midline  Respiratory Failure, resolved  Secondary to [place]  Off sedation and extubated 9/18  Re-intubated 9/18 due to low O2 sat and not protect airway  Extubated 9/23, tolerating well  Off precedex, on seroquel 50 bid, clonidine taper  CCM signed off 9/29 /20  VAP vs Aspiration PNA (vomited PTA) Leukocytosis, Febrile - resolved  WBC - 10.5 ->10.3    Afebrile  U/A neg 9/14  Blood cultures normal flora  CXR progressive RLL atx/infilatrate -> CXR - 09/16/19 neg  D/c Vanc, d/c Zosyn -> Unasyn 9/17-9/20  At risk for ongoing aspiration. Continue aspiration precautions  Hypertensive Emergency  Probable undiagnosed / untreated hypertension (severe LVH on ECG)  Home BP meds: none  Now on amlodipine 10, labetolol 300 tid    Hydralazine discontinued due to tachycardia  Off Lisinopril D/t increasing Cr  SBP goal <  160 mm Hg     Off cleviprex   BP stable   Tachycardia, unclear cause  d/c hydralazeine  Lab stable  Unclear source  On labetalol 300mg  tid  Management as per primary team  Seizure  On presentation  Hx of one seizure previously  On Keppra 500 mg Bid  Follow up as outpt  AKI on CKD stage III  ACE stopped 9/14.   Renal US mild echogenic kidneys c/w CKD  Creatinine - 2.39->2.43->2.44 -> 1.29  Continue TF and free water    Alcohol abuse  Hx ETOH abuse  recent hospitalization for alcohol poisoning.   Drinks 1 pt [place] per day.   On FA/MVI/B1  On CIWA protocol.  Hyperlipidemia  Home Lipid lowering medication: none  LDL - 86  Current lipid lowering medication: None  May consider  lipitor 20mg  at discharge  Dysphagia Malnutrition . Secondary to stroke . NPO  Has cortrak and tube feeding @ 53 on hold  Speech following - unable to clear for diet  PEG placed 10/1 by trauma surgeons   Anemia of critical illness  Hgb 14.5->...10.2->10.7  Unclear etiology  On PPI BID  Close CBC monitoring  Other Stroke Risk Factors  Polysubstance abuse, UDS - negative 09/09/19  Other Active Problems  U/A and culture -> U/A - negative ; Culture > 100k Klebsiella oxytoca. ancef 9/28, enteral keflex 9/30>>pt completed 3 day course of [facility] day # 23  Neurology will sign off. Please call with questions. Pt will follow up with stroke clinic NP at [facility] in about 4 weeks. Thanks for the consult.   Rosalin Hawking, MD PhD Stroke Neurology 10/02/2019 1:06 PM  To contact Stroke Continuity provider, please refer to [email or web address] After hours, contact General Neurology

## 2019-10-02 NOTE — Progress Notes (Addendum)
  Speech Language Pathology Treatment: Dysphagia;Cognitive-Linquistic  Patient Details Name: Joshua [name] MRN: [id number] DOB: 27-Dec-1981 Today's Date: 10/02/2019 Time: [id number] SLP Time Calculation (min) (ACUTE ONLY): 16 min  Assessment / Plan / Recommendation Clinical Impression  Pt was seen for dysphagia and cognitive-linguistic treatment in this session.  Pt was encountered awake/alert, lying semi-reclined in bed with sister present at bedside.  Pt did not attempt to vocalize during this tx session despite cuing.  When asked basic yes/no questions, pt was unable to respond verbally, shake his head, squeeze his hand, or blink his eyes meaningfully to answer the questions.  Pt did not make eye contact with SLP during this session; however, sister reported that he intermittently maintained eye contact with her earlier today.  Pt followed 1/2 basic verbal commands to raise his left hand.   SLP completed oral care with pt prior to po trials.  Pt was unable to follow commands to open his mouth, therefore oral care focused on lateral sulci and anterior surface of the maxillary/mandibular dentition.  Following oral care, labial movement was observed with ice chips were presented to the pt's labial surface.  When ice chips were presented past the labial surface, anterior to his dentition, pt continued to exhibit labial movement; however, he was unable to transfer the ice chip posterior to his dentition and the bolus eventually spilled anteriorly out of his oral cavity.  Similar results occurred with thin liquid trials via tsp.  Remaining bolus was suctioned from the pt's oral cavity secondary to aspiration concerns.  Recommend continuation of NPO with alternate means of nutrition and frequent oral care.     HPI HPI: Pt is a 38 year old male who presented with new seizure, and was found to have a large left BG intracranial hemorrhage s/p L frontal temporal craniectomy with evacuation 9/12. ETT 9/12-9/18;  9/18-9/23. PMH: HTN, alcoholism, noncompliance      SLP Plan  Continue with current plan of care       Recommendations  Diet recommendations: NPO Medication Administration: Via alternative means                Oral Care Recommendations: Oral care QID Follow up Recommendations: Other (comment)(Pending progress ) SLP Visit Diagnosis: Dysphagia, unspecified (R13.10);Cognitive communication deficit (R41.841) Plan: Continue with current plan of care       Bretta Bang, M.S., [place] Office: [phone]      [place] 10/02/2019, 3:30 PM

## 2019-10-02 NOTE — Progress Notes (Signed)
PROGRESS NOTE        PATIENT DETAILS Name: Joshua [name] Age: 38 y.o. Sex: male Date of Birth: 1981/10/24 Admit Date: 09/09/2019 Admitting Physician Erline Levine, MD BTD:[id number], [name]  Brief Narrative: Patient is a 38 y.o. male with history of HTN, seizure disorder, alcohol abuse-who with seizure and right-sided hemiplegia-upon further evaluation was found to have a large left intracranial hemorrhage.  Patient was intubated-admitted to the ICU.  Evaluated by neurosurgery-underwent left frontal temporal craniectomy and evacuation of the hematoma.  Upon further improvement-transferred to the triad hospitalist service on 9/30.  Significant events: 9/12>>Admitted/  operating room for left frontal temporal craniectomy and evacuation hematoma 9/18 extubated > reintubated due to protect airway and low oxygen 9/23 > Extubated  9/30>> transferred to triad hospitalist. 10/1>> PEG tube placement by CCS  Subjective: Lying comfortably in bed-no major issues overnight.  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  CTA head negative for aneurysm or vascular malformation.  Remains with dense right-sided hemiplegia and aphasia.  Patient is now s/p PEG tube placement.  CIR-appropriate for SNF-we will consult social work.  Aspiration pneumonia: Continue n.p.o. status-now on tube feedings-completed a course of antimicrobial therapy.  Due to severe dysphagia from CVA-remains at risk of aspiration-continue Robinul.  Have asked RN to always keep head end of the bed raised to more than 45 degrees.  Continue maximum aspiration precautions.  Dysphagia: Secondary to  CVA-underwent PEG tube placement on 10/1-tolerating PEG tube feedings.  AKI versus CKD stage IV: Suspect has underlying CKD stage III-probably from hypertensive nephrosclerosis.  Continue to follow electrolytes closely.  UA without proteinuria.  Renal ultrasound suggestive of chronic kidney disease.  .  EtOH abuse: No obvious signs of withdrawal-has completed a clonidine taper.  HTN: Stable-continue with Norvasc, labetalol.  Will follow and adjust accordingly.  Seizure: Continue Keppra  Nutrition Problem: Nutrition Problem: Inadequate oral intake Etiology: inability to eat Signs/Symptoms: NPO status Interventions: Refer to RD note for recommendations   Diet: Diet Order            Diet NPO time specified  Diet effective midnight               DVT Prophylaxis: Prophylactic Heparin   Code Status: Full code   Family Communication: None at bedside  Disposition Plan: Remain inpatient-SNF on discharge-not a CIR candidate.  Antimicrobial agents: Anti-infectives (From admission, onward)   Start     Dose/Rate Route Frequency Ordered Stop   09/27/19 1130  cephALEXin (KEFLEX) 250 MG/5ML suspension 500 mg     500 mg Per Tube Every 12 hours 09/27/19 1052 09/29/19 2250   09/25/19 1445  ceFAZolin (ANCEF) IVPB 2g/100 mL premix  Status:  Discontinued     2 g 200 mL/hr over 30 Minutes Intravenous Every 12 hours 09/25/19 1417 09/27/19 1052   09/14/19 1000  Ampicillin-Sulbactam (UNASYN) 3 g in sodium chloride 0.9 % 100 mL IVPB     3 g 200 mL/hr over 30 Minutes Intravenous Every 8 hours 09/14/19 0922 09/17/19 1816   09/13/19 1100  vancomycin (VANCOCIN) IVPB 750 mg/150 ml premix  Status:  Discontinued     750 mg 150 mL/hr over 60 Minutes Intravenous Every 24 hours 09/13/19 0728 09/13/19 0838   09/12/19 1100  vancomycin (VANCOCIN) IVPB 1000 mg/200 mL premix  Status:  Discontinued     1,000 mg 200 mL/hr over 60 Minutes Intravenous Every 24 hours 09/11/19 1034 09/13/19 0728   09/11/19  1130  piperacillin-tazobactam (ZOSYN) IVPB 3.375 g  Status:  Discontinued     3.375 g 12.5 mL/hr over 240 Minutes Intravenous Every 8 hours 09/11/19 1054 09/14/19 0845   09/11/19 1100  vancomycin (VANCOCIN) 1,500 mg in sodium chloride 0.9 % 500 mL IVPB     1,500 mg 250 mL/hr over 120 Minutes Intravenous  Once 09/11/19 1034 09/11/19 1900   09/11/19 1015  piperacillin-tazobactam (ZOSYN) IVPB 4.5 g  Status:  Discontinued     4.5 g 200 mL/hr over 30 Minutes Intravenous Every 8 hours 09/11/19 1010 09/11/19 1052   09/09/19 0845  ceFAZolin (ANCEF) IVPB 2g/100 mL premix     2 g 200 mL/hr over 30 Minutes Intravenous Every 8 hours 09/09/19 0842 09/09/19 1658      Procedures:  left frontal temporal craniectomy and evacuation hematoma>> 9/12 ETT > 9/12- 9/18 re-intubated 9/18-9/23 Right radial A-line 9/12 > 9/15 L cortrak 9/14 >> Right Brachial PICC 9/14 >> 9/19 10/1>> PEG tube placement by CCS  CONSULTS:  pulmonary/intensive care, neurology and Neurosurgery  Time spent: 15 minutes-Greater than 50% of this time was spent in counseling, explanation of diagnosis, planning of further management, and coordination of care.  MEDICATIONS: Scheduled Meds:  alteplase  2 mg Intracatheter Once   amLODipine  10 mg Per Tube Daily   chlorhexidine gluconate (MEDLINE KIT)  15 mL Mouth Rinse BID   folic acid  1 mg Per Tube Daily   free water  200 mL Per Tube Q4H   glycopyrrolate  0.1 mg Intravenous TID   heparin injection (subcutaneous)  5,000 Units Subcutaneous Q8H   insulin aspart  0-15 Units Subcutaneous Q4H   labetalol  300 mg Per Tube TID   levETIRAcetam  500 mg Per Tube BID   lipase/protease/amylase)  20,880 Units Per Tube Once   And   sodium bicarbonate  650 mg Per Tube Once   mouth rinse  15 mL Mouth Rinse 10 times per day   multivitamin  15 mL Per Tube Daily   pantoprazole sodium  40 mg Per Tube BID   QUEtiapine  50 mg Per Tube BID   sodium chloride flush  10-40 mL  Intracatheter Q12H   thiamine  100 mg Per Tube Daily   Continuous Infusions:  feeding supplement (OSMOLITE 1.5 CAL) 1,000 mL (10/01/19 2101)   PRN Meds:.acetaminophen **OR** acetaminophen, bisacodyl, hydrALAZINE, labetalol, [DISCONTINUED] ondansetron **OR** ondansetron (ZOFRAN) IV, polyethylene glycol, promethazine, sodium chloride flush, traMADol   PHYSICAL EXAM: Vital signs: Vitals:   10/01/19 2324 10/02/19 0418 10/02/19 0759 10/02/19 0854  BP: (!) 110/91 (!) 137/104 (!) 153/109   Pulse: (!) 113 (!) 101 (!) 104 97  Resp: '18 18 20   ' Temp: 98.4 F (36.9 C) 98.6 F (37 C) 99.1 F (37.3 C)   TempSrc: Rectal Axillary Oral   SpO2: 95% 97% 100%   Weight:      Height:       Filed Weights   09/27/19 0500 09/28/19 0333 09/29/19 0109  Weight: 60.8 kg 61.3 kg 60 kg   Body mass index is 19.53 kg/m.   Gen Exam:Alert and aphasic-really does not respond to either verbal or  physical commands. HEENT:atraumatic, normocephalic Chest: B/L clear to auscultation anteriorly CVS:S1S2 regular Abdomen:soft non tender, non distended Extremities:no edema Neurology: Right-sided hemiplegia Skin: no rash I have personally reviewed following labs and imaging studies  LABORATORY DATA: CBC: Recent Labs  Lab 09/28/19 0440 10/01/19 0516  WBC 10.5 10.3  HGB 10.7* 10.7*  HCT 33.1* 33.5*  MCV 91.2 91.0  PLT 432* 993    Basic Metabolic Panel: Recent Labs  Lab 09/26/19 0801 09/28/19 0440 09/29/19 0436 10/01/19 0516 10/02/19 0555  NA 144 144 143 141 136  K 4.8 5.0 4.6 5.2* 4.1  CL 109 110 107 103 103  CO2 '24 24 25 25 ' 21*  GLUCOSE 121* 115* 160* 112* 111*  BUN 63* 58* 58* 65* 10  CREATININE 2.39* 2.28* 2.43* 2.44* 1.29*  CALCIUM 9.6 10.1 9.8 10.1 9.1  MG  --   --   --  2.3  --   PHOS  --   --   --  6.3*  --     GFR: Estimated Creatinine Clearance: 65.2 mL/min (A) (by C-G formula based on SCr of 1.29 mg/dL (H)).  Liver Function Tests: No results for input(s): AST, ALT, ALKPHOS,  BILITOT, PROT, ALBUMIN in the last 168 hours. No results for input(s): LIPASE, AMYLASE in the last 168 hours. No results for input(s): AMMONIA in the last 168 hours.  Coagulation Profile: No results for input(s): INR, PROTIME in the last 168 hours.  Cardiac Enzymes: No results for input(s): CKTOTAL, CKMB, CKMBINDEX, TROPONINI in the last 168 hours.  BNP (last 3 results) No results for input(s): PROBNP in the last 8760 hours.  HbA1C: No results for input(s): HGBA1C in the last 72 hours.  CBG: Recent Labs  Lab 10/01/19 1605 10/01/19 2051 10/01/19 2322 10/02/19 0418 10/02/19 0756  GLUCAP 107* 135* 93 135* 120*    Lipid Profile: No results for input(s): CHOL, HDL, LDLCALC, TRIG, CHOLHDL, LDLDIRECT in the last 72 hours.  Thyroid Function Tests: No results for input(s): TSH, T4TOTAL, FREET4, T3FREE, THYROIDAB in the last 72 hours.  Anemia Panel: No results for input(s): VITAMINB12, FOLATE, FERRITIN, TIBC, IRON, RETICCTPCT in the last 72 hours.  Urine analysis:    Component Value Date/Time   COLORURINE YELLOW (A) 09/23/2019 1108   APPEARANCEUR CLEAR (A) 09/23/2019 1108   LABSPEC 1.015 09/23/2019 1108   PHURINE 7.5 09/23/2019 1108   GLUCOSEU NEGATIVE 09/23/2019 1108   HGBUR NEGATIVE 09/23/2019 1108   [place] 09/23/2019 1108   [place] 09/23/2019 1108   PROTEINUR NEGATIVE 09/23/2019 1108   UROBILINOGEN 1.0 04/05/2009 0021   NITRITE NEGATIVE 09/23/2019 1108   LEUKOCYTESUR NEGATIVE 09/23/2019 1108    Sepsis Labs: Lactic Acid, Venous No results found for: LATICACIDVEN  MICROBIOLOGY: Recent Results (from the past 240 hour(s))  Urine Culture     Status: Abnormal   Collection Time: 09/23/19 10:01 AM   Specimen: Urine, Random  Result Value Ref Range Status   Specimen Description URINE, RANDOM  Final   Special Requests   Final    NONE Performed at [facility], [place] [address], [place], [place] [address]    Culture >=100,000 COLONIES/mL  KLEBSIELLA OXYTOCA (A)  Final   Report Status 09/25/2019 FINAL  Final   Organism ID, Bacteria KLEBSIELLA OXYTOCA (A)  Final      Susceptibility   Klebsiella oxytoca - MIC*    AMPICILLIN >=32 RESISTANT Resistant     CEFAZOLIN <=4 SENSITIVE Sensitive     CEFTRIAXONE <=1 SENSITIVE Sensitive  CIPROFLOXACIN <=0.25 SENSITIVE Sensitive     GENTAMICIN <=1 SENSITIVE Sensitive     IMIPENEM <=0.25 SENSITIVE Sensitive     NITROFURANTOIN <=16 SENSITIVE Sensitive     TRIMETH/SULFA <=20 SENSITIVE Sensitive     AMPICILLIN/SULBACTAM >=32 RESISTANT Resistant     PIP/TAZO >=128 RESISTANT Resistant     Extended ESBL NEGATIVE Sensitive     * >=100,000 COLONIES/mL KLEBSIELLA OXYTOCA    RADIOLOGY STUDIES/RESULTS: Ct Angio Head W Or Wo Contrast  Result Date: 09/10/2019 CLINICAL DATA:  Intracranial hemorrhage follow up EXAM: CT HEAD WITHOUT CONTRAST CT ANGIOGRAPHY OF THE HEAD TECHNIQUE: Contiguous axial images were obtained from the base of the skull through the vertex without intravenous contrast. Multidetector CT imaging of the head was performed using the standard protocol during bolus administration of intravenous contrast. Multiplanar CT image reconstructions and MIPs were obtained to evaluate the vascular anatomy. CONTRAST:  31m OMNIPAQUE IOHEXOL 350 MG/ML SOLN COMPARISON:  Head CT 09/09/2019 FINDINGS: CT HEAD FINDINGS Brain: Status post hematoma evacuation. Residual hematoma with interspersed postoperative gas measures 6.1 x 2.9 cm, centered in the left basal ganglia. Entrapment of the right lateral ventricle temporal horn has resolved. Volume a blood within the lateral ventricles is unchanged. Punctate focus of hemorrhage at the posterior right insula is unchanged. There is now extra-axial blood over the left convexity underlying the craniectomy site. Midline shift has improved, now measuring 6 mm to the right. Skull: Left pterional craniectomy Sinuses/Orbits: Negative CTA HEAD FINDINGS POSTERIOR  CIRCULATION: --Vertebral arteries: Normal V4 segments. --Posterior inferior cerebellar arteries (PICA): Patent origins from the vertebral arteries. --Anterior inferior cerebellar arteries (AICA): Patent origins from the basilar artery. --Basilar artery: Normal. --Superior cerebellar arteries: Normal. --Posterior cerebral arteries: Normal. Both originate from the basilar artery. Posterior communicating arteries (p-comm) are diminutive or absent. ANTERIOR CIRCULATION: --Intracranial internal carotid arteries: Normal. --Anterior cerebral arteries (ACA): Normal. Both A1 segments are present. Patent anterior communicating artery (a-comm). --Middle cerebral arteries (MCA): Normal. VENOUS SINUSES: As permitted by contrast timing, patent. ANATOMIC VARIANTS: None Review of the MIP images confirms the above findings. IMPRESSION: 1. Status post left pterional craniectomy for evacuation of left basal ganglia hematoma. Residual hematoma with interspersed postoperative gas measures 6.1 x 2.9 cm. 2. Decreased midline shift, now measuring 6 mm to the right. 3. Unchanged punctate focus of hemorrhage at the posterior right insula. 4. Unchanged intraventricular blood. 5. No intracranial arterial occlusion or high-grade stenosis. No aneurysm or vascular malformation. Electronically Signed   By: KUlyses JarredM.D.   On: 09/10/2019 05:58   Ct Head Wo Contrast  Result Date: 09/17/2019 CLINICAL DATA:  Intracranial hemorrhage follow up EXAM: CT HEAD WITHOUT CONTRAST TECHNIQUE: Contiguous axial images were obtained from the base of the skull through the vertex without intravenous contrast. COMPARISON:  Head CT 09/13/2019 FINDINGS: Brain: The size of the intraparenchymal hematoma centered in the left basal ganglia is unchanged. Surrounding edema and postsurgical changes also remain the same. There is a small amount of blood within both occipital horns. Rightward midline shift measures 6 mm at the level of the foramina Monro, unchanged. No  new site of hemorrhage. Vascular: No abnormal hyperdensity of the major intracranial arteries or dural venous sinuses. No intracranial atherosclerosis. Skull: Left pterional craniectomy with soft tissue swelling overlying. Sinuses/Orbits: No fluid levels or advanced mucosal thickening of the visualized paranasal sinuses. No mastoid or middle ear effusion. The orbits are normal. IMPRESSION: 1. Unchanged size of intraparenchymal hematoma centered in the left basal ganglia with unchanged surrounding edema and  mass effect. 2. Unchanged rightward midline shift, measuring 6 mm. Electronically Signed   By: Ulyses Jarred M.D.   On: 09/17/2019 01:46   Ct Head Wo Contrast  Result Date: 09/13/2019 CLINICAL DATA:  Stroke follow-up. EXAM: CT HEAD WITHOUT CONTRAST TECHNIQUE: Contiguous axial images were obtained from the base of the skull through the vertex without intravenous contrast. COMPARISON:  09/10/2019 and 09/09/2019 FINDINGS: Brain: Evidence of patient's recent evacuation of central left-sided hemorrhagic stroke. Residual hemorrhage over the left basal ganglia measures approximately 2.8 x 6.5 cm without significant change. There is less air within the central aspect of the hemorrhage. Stable adjacent postsurgical change, edema and minimal mass effect. Stable mild midline shift to the right of 5-6 mm. Persistent 4 mm hemorrhagic focus over the right basal ganglia is becoming less dense. Vascular: No hyperdense vessel or unexpected calcification. Skull: Evidence of recent right frontal parietal craniectomy. Skin staples over the left scalp. Sinuses/Orbits: Orbits are normal. Paranasal sinuses are well aerated. Other: Periodontal disease. IMPRESSION: Stable changes post left frontoparietal craniectomy for evacuation of left-sided central hemorrhagic stroke. Residual without significant change measuring 2.8 x 6.5 cm with less air within the hemorrhage. Stable postsurgical change with mild edema/mass effect and stable  midline shift to the right of 5-6 mm. Decrease in density 4 mm focus of hemorrhage over the right basal ganglia. Electronically Signed   By: Marin Olp M.D.   On: 09/13/2019 08:24   Ct Head Wo Contrast  Result Date: 09/10/2019 CLINICAL DATA:  Intracranial hemorrhage follow up EXAM: CT HEAD WITHOUT CONTRAST CT ANGIOGRAPHY OF THE HEAD TECHNIQUE: Contiguous axial images were obtained from the base of the skull through the vertex without intravenous contrast. Multidetector CT imaging of the head was performed using the standard protocol during bolus administration of intravenous contrast. Multiplanar CT image reconstructions and MIPs were obtained to evaluate the vascular anatomy. CONTRAST:  52m OMNIPAQUE IOHEXOL 350 MG/ML SOLN COMPARISON:  Head CT 09/09/2019 FINDINGS: CT HEAD FINDINGS Brain: Status post hematoma evacuation. Residual hematoma with interspersed postoperative gas measures 6.1 x 2.9 cm, centered in the left basal ganglia. Entrapment of the right lateral ventricle temporal horn has resolved. Volume a blood within the lateral ventricles is unchanged. Punctate focus of hemorrhage at the posterior right insula is unchanged. There is now extra-axial blood over the left convexity underlying the craniectomy site. Midline shift has improved, now measuring 6 mm to the right. Skull: Left pterional craniectomy Sinuses/Orbits: Negative CTA HEAD FINDINGS POSTERIOR CIRCULATION: --Vertebral arteries: Normal V4 segments. --Posterior inferior cerebellar arteries (PICA): Patent origins from the vertebral arteries. --Anterior inferior cerebellar arteries (AICA): Patent origins from the basilar artery. --Basilar artery: Normal. --Superior cerebellar arteries: Normal. --Posterior cerebral arteries: Normal. Both originate from the basilar artery. Posterior communicating arteries (p-comm) are diminutive or absent. ANTERIOR CIRCULATION: --Intracranial internal carotid arteries: Normal. --Anterior cerebral arteries (ACA):  Normal. Both A1 segments are present. Patent anterior communicating artery (a-comm). --Middle cerebral arteries (MCA): Normal. VENOUS SINUSES: As permitted by contrast timing, patent. ANATOMIC VARIANTS: None Review of the MIP images confirms the above findings. IMPRESSION: 1. Status post left pterional craniectomy for evacuation of left basal ganglia hematoma. Residual hematoma with interspersed postoperative gas measures 6.1 x 2.9 cm. 2. Decreased midline shift, now measuring 6 mm to the right. 3. Unchanged punctate focus of hemorrhage at the posterior right insula. 4. Unchanged intraventricular blood. 5. No intracranial arterial occlusion or high-grade stenosis. No aneurysm or vascular malformation. Electronically Signed   By: KCletus GashD.  On: 09/10/2019 05:58   US Renal  Result Date: 09/13/2019 CLINICAL DATA:  38 year old male with acute renal insufficiency. EXAM: RENAL / URINARY TRACT ULTRASOUND COMPLETE COMPARISON:  None. FINDINGS: Evaluation is somewhat limited due to inability of the patient to cooperate with the exam. Right Kidney: Renal measurements: 10.9 x 4.9 x 6.4 cm = volume: 179 mL. There is increased renal parenchymal echogenicity. There is mild hydronephrosis of the right kidney. No shadowing stone. Left Kidney: Renal measurements: 9.8 x 4.8 x 5.9 cm = volume: 145 mL. There is increased renal parenchymal echogenicity. Mild hydronephrosis. No shadowing stone. Bladder: The urinary bladder is distended otherwise unremarkable. Ureteral jets are not visualized. IMPRESSION: 1. Mildly echogenic kidneys likely related to underlying medical renal disease. 2. Mild bilateral hydronephrosis.  No shadowing stone. Electronically Signed   By: Anner Crete M.D.   On: 09/13/2019 11:41   Dg Chest Port 1 View  Result Date: 09/23/2019 CLINICAL DATA:  Fever EXAM: PORTABLE CHEST 1 VIEW COMPARISON:  09/18/2019 FINDINGS: There has been interval endotracheal extubation. There is no acute appearing  airspace opacity. Cardiomegaly. Partially imaged enteric feeding tube. IMPRESSION: 1. There has been interval endotracheal extubation. There is no acute appearing airspace opacity. 2.  Cardiomegaly. Electronically Signed   By: Eddie Candle M.D.   On: 09/23/2019 13:18   Dg Chest Port 1 View  Result Date: 09/18/2019 CLINICAL DATA:  Intubation.  Seizure.  Head injury. EXAM: PORTABLE CHEST 1 VIEW COMPARISON:  09/17/2019. FINDINGS: Endotracheal tube, feeding tube in stable position position. PICC line tip noted over the right atrium. Stable cardiomegaly. Low lung volumes with right base atelectasis/infiltrate. No pleural effusion or pneumothorax. IMPRESSION: 1. Endotracheal tube, feeding tube in stable position. PICC line tip noted over the right atrium. 2.  Stable cardiomegaly. 3.  Low lung volumes with right base atelectasis/infiltrate. Electronically Signed   By: Marcello Moores  Register   On: 09/18/2019 06:54   Dg Chest Port 1 View  Result Date: 09/17/2019 CLINICAL DATA:  Respiratory failure. Follow-up exam. Intubated patient. EXAM: PORTABLE CHEST 1 VIEW COMPARISON:  09/16/2019 and exams. FINDINGS: Basilar atelectasis has improved.  Lungs now clear. No pneumothorax or pleural effusion. Endotracheal tube, enteric tube and left PICC are stable. IMPRESSION: 1. Lungs now clear. 2. No new abnormalities. 3. Stable support apparatus. Electronically Signed   By: Lajean Manes M.D.   On: 09/17/2019 06:35   Dg Chest Port 1 View  Result Date: 09/16/2019 CLINICAL DATA:  Acute respiratory failure.  Follow-up exam. EXAM: PORTABLE CHEST 1 VIEW COMPARISON:  09/15/2019 and earlier exams. FINDINGS: Cardiac silhouette mildly enlarged. Atelectasis noted at the right lung base on the prior study has improved. Remainder of the lungs is clear. Endotracheal tube, left-sided PICC and enteric feeding tube are stable. IMPRESSION: 1. No acute cardiopulmonary disease. Right lung base atelectasis has improved. 2. Support apparatus is stable  and well positioned. Electronically Signed   By: Lajean Manes M.D.   On: 09/16/2019 11:36   Dg Chest Port 1 View  Result Date: 09/15/2019 CLINICAL DATA:  Respiratory failure. EXAM: PORTABLE CHEST 1 VIEW COMPARISON:  Chest x-ray 09/10/2019. FINDINGS: Endotracheal tube noted with tip 3 cm above the carina. Feeding tube noted with tip below left hemidiaphragm. Left PICC line noted with tip at cavoatrial junction. Stable cardiomegaly. Progressive right lower lobe atelectasis/infiltrate. No pleural effusion or pneumothorax. IMPRESSION: 1.  Lines and tubes in good anatomic position. 2.  Progressive right lower lobe atelectasis/infiltrate. Electronically Signed   By: Marcello Moores  Register  On: 09/15/2019 08:14   Dg Chest Port 1 View  Result Date: 09/10/2019 CLINICAL DATA:  Endotracheal tube placement. EXAM: PORTABLE CHEST 1 VIEW COMPARISON:  09/09/2019 FINDINGS: Endotracheal tube has tip 4.1 cm above the carina. Enteric tube courses into the stomach and off the film as tip is not visualized. Lungs are adequately inflated with mild stable elevation of the right hemidiaphragm. No focal lobar consolidation or effusion. Previous seen hazy density over the right midlung is improved to nearly resolved likely resolving atelectasis. Cardiomediastinal silhouette and remainder of the exam is unchanged. IMPRESSION: Resolving right midlung atelectasis. Tubes and lines as described. Electronically Signed   By: Marin Olp M.D.   On: 09/10/2019 08:41   Dg Chest Port 1 View  Result Date: 09/09/2019 CLINICAL DATA:  Respiratory failure. EXAM: PORTABLE CHEST 1 VIEW COMPARISON:  09/09/2019 FINDINGS: Endotracheal tube tip 5 cm above the carina. Enteric tube courses into the stomach and off the film as tip is not visualized. Lungs are adequately inflated with mild elevation of the right hemidiaphragm. There is new hazy airspace opacification over the right mid to lower lung likely atelectasis. Left lung is clear. Cardiomediastinal  silhouette and remainder of the exam is unchanged. IMPRESSION: New hazy opacification over the right midlung likely atelectasis. Tubes and lines as described. Electronically Signed   By: Marin Olp M.D.   On: 09/09/2019 11:22   Dg Chest Port 1 View  Result Date: 09/09/2019 CLINICAL DATA:  ET and OG placement EXAM: PORTABLE CHEST 1 VIEW COMPARISON:  None. FINDINGS: Transesophageal tube tip and side port distal to the GE junction terminating below the level of imaging. Endotracheal tube tip terminates appropriately in the mid trachea, approximately 5 cm from the carina. Additional support devices overlie the chest. No consolidation, features of edema, pneumothorax, or effusion. Pulmonary vascularity is normally distributed. The cardiomediastinal contours are unremarkable. No acute osseous or soft tissue abnormality. IMPRESSION: No acute cardiopulmonary abnormality. Appropriate positioning of the endotracheal and transesophageal tubes. Electronically Signed   By: Lovena Le M.D.   On: 09/09/2019 03:30   Dg Chest Port 1v Same Day  Result Date: 09/28/2019 CLINICAL DATA:  Short of breath.  Code stroke EXAM: PORTABLE CHEST 1 VIEW COMPARISON:  09/23/2019 FINDINGS: Cardiac silhouette borderline enlarged. No mediastinal or hilar masses or evidence of adenopathy. Clear lungs.  No pleural effusion or pneumothorax. Skeletal structures are grossly intact. IMPRESSION: No acute cardiopulmonary disease. Electronically Signed   By: Lajean Manes M.D.   On: 09/28/2019 19:34   Dg Abd Portable 1v  Result Date: 09/21/2019 CLINICAL DATA:  Feeding tube placement EXAM: PORTABLE ABDOMEN - 1 VIEW COMPARISON:  None. FINDINGS: Feeding tube coils in the mid stomach. Nonobstructive bowel gas pattern. IMPRESSION: Feeding tube coils in the mid stomach. Electronically Signed   By: Rolm Baptise M.D.   On: 09/21/2019 21:11   Ct Head Code Stroke Wo Contrast  Result Date: 09/09/2019 CLINICAL DATA:  Code stroke.  Left-sided weakness  EXAM: CT HEAD WITHOUT CONTRAST TECHNIQUE: Contiguous axial images were obtained from the base of the skull through the vertex without intravenous contrast. COMPARISON:  None. FINDINGS: Brain: There is an intraparenchymal hematoma centered in the left basal ganglia measuring 7.8 x 4.8 cm. There is rightward midline shift that measures 1.4 cm. There is early subfalcine herniation. Hemorrhage has extended into the lateral ventricles and third ventricle. There is a small focus of intraparenchymal hemorrhage at the right basal ganglia measuring approximately 4 mm. There is right ventricular entrapment with  early dilatation of the temporal horn. Basal cisterns are effaced. Vascular: No hyperdense vessel or unexpected calcification. Skull: Normal. Negative for fracture or focal lesion. Sinuses/Orbits: No acute finding. Other: None. ASPECTS [facility] Stroke Program Early CT Score) is not applicable in the case of acute hemorrhage. IMPRESSION: 1. 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 2. Intraventricular extension of hemorrhage. Small focus of intraparenchymal hemorrhage in the right basal ganglia. Critical Value/emergent results were called by telephone at the time of interpretation on 09/09/2019 at 3:21 am to provider Dr. Kerney Elbe, Who verbally acknowledged these results. Electronically Signed   By: Ulyses Jarred M.D.   On: 09/09/2019 03:42   [place] Ekg Site Rite  Result Date: 09/11/2019 If Site Rite image not attached, placement could not be confirmed due to current cardiac rhythm.    LOS: 23 days   Oren Binet, MD  [facility]  If 7PM-7AM, please contact night-coverage  Please page via [email or web address]  Go to [email or web address] and use [place]'s universal password to access. If you do not have the password, please contact the hospital operator.  Locate the [facility] provider you are looking for  under [facility] and page to a number that you can be directly reached. If you still have difficulty reaching the provider, please page the [facility] (Director on Call) for the Hospitalists listed on amion for assistance.  10/02/2019, 10:31 AM

## 2019-10-02 NOTE — Progress Notes (Signed)
Occupational Therapy Treatment Patient Details Name: Joshua [name] MRN: [id number] DOB: 08-08-1981 Today's Date: 10/02/2019    History of present illness This pt is a 38 year old with history of alcohol abuse, seizures, noncompliant hypertension who presented with new seizure right-sided hemiplegia and confusion was found to have a large left intracranial hemorrhage now s/p left frontoparietal craniectomy for evacuation of left basal ganglia hematoma on 09/09/19. Skull flap in abdomen.  Intubated 9/12 (day of admission) and extubated 09/15/19.    OT comments  Pt continues to present with increased tone and flexion pattern of RUE. Performing splint check and PROM of RUE. No signs of redness or increased edema. Confirming wear schedule with RN and educating on splint donning/doffing. PROM of RUE supine for 10 reps at hand, wrist, elbow, and shoulder. Total A for grooming task and bed mobility. Continue to recommend dc to CIR and will continue to follow acutely as admitted.     Follow Up Recommendations  CIR;Supervision/Assistance - 24 hour    Equipment Recommendations  Other (comment)(Defer to next venue)    Recommendations for Other Services Rehab consult    Precautions / Restrictions Precautions Precautions: Fall Precaution Comments: L crani, bone flap removed and in abdomen, NG, L wrist restraint and mitt Restrictions Weight Bearing Restrictions: No       Mobility Bed Mobility Overal bed mobility: Needs Assistance             General bed mobility comments: Total A for repositioning in bed  Transfers                      Balance                                           ADL either performed or assessed with clinical judgement   ADL Overall ADL's : Needs assistance/impaired     Grooming: Wash/dry hands;Bed level;Total assistance Grooming Details (indicate cue type and reason): Total A for washing hands while supine in bed                                General ADL Comments: Focused session on splint check and ROM exercises.      Vision   Vision Assessment?: Vision impaired- to be further tested in functional context;Yes   Perception     Praxis      Cognition Arousal/Alertness: Lethargic Behavior During Therapy: Flat affect Overall Cognitive Status: Difficult to assess                                 General Comments: Pt continues to not make eye contact and follow commands. impulsively moving LUE and LLE        Exercises Exercises: General Upper Extremity General Exercises - Upper Extremity Shoulder Flexion: PROM;Right;10 reps;Supine Shoulder Extension: PROM;Right;10 reps;Supine Elbow Extension: PROM;Right;Supine;10 reps Wrist Flexion: PROM;Right;Supine;10 reps Wrist Extension: PROM;Right;Supine;10 reps Digit Composite Flexion: PROM;Right;Supine;10 reps Composite Extension: PROM;Right;Supine;10 reps   Shoulder Instructions       General Comments Pt's sister present for first half of session. Educating on splint management and ROM    Pertinent Vitals/ Pain       Pain Assessment: Faces Faces Pain Scale: No hurt Pain Intervention(s): Monitored during session;Limited activity within patient's tolerance;Repositioned  Home Living                                          Prior Functioning/Environment              Frequency  Min 2X/week        Progress Toward Goals  OT Goals(current goals can now be found in the care plan section)  Progress towards OT goals: Progressing toward goals  Acute Rehab OT Goals Patient Stated Goal: none stated OT Goal Formulation: Patient unable to participate in goal setting Time For Goal Achievement: 10/06/19 Potential to Achieve Goals: Good ADL Goals Pt Will Perform Grooming: with min assist;sitting Pt/caregiver will Perform Home Exercise Program: Increased ROM;Increased strength;Right Upper extremity;With minimal  assist Additional ADL Goal #1: Pt will maintain static balance sitting EOB with modA to increase independence with ADL task. Additional ADL Goal #2: Pt will track towards R visual field with no more than mod cues. Additional ADL Goal #3: Pt will follow one step commands with 50% accuracy during functional task.  Plan Discharge plan remains appropriate    Co-evaluation                 AM-PAC OT "6 Clicks" Daily Activity     Outcome Measure   Help from another person eating meals?: Total Help from another person taking care of personal grooming?: Total Help from another person toileting, which includes using toliet, bedpan, or urinal?: Total Help from another person bathing (including washing, rinsing, drying)?: Total Help from another person to put on and taking off regular upper body clothing?: Total Help from another person to put on and taking off regular lower body clothing?: Total 6 Click Score: 6    End of Session Equipment Utilized During Treatment: Other (comment)(Resting hand splint and elbow splint)  OT Visit Diagnosis: Other abnormalities of gait and mobility (R26.89);Hemiplegia and hemiparesis;Cognitive communication deficit (R41.841) Symptoms and signs involving cognitive functions: Other Nontraumatic ICH Hemiplegia - Right/Left: Right Hemiplegia - caused by: Other Nontraumatic intracranial hemorrhage   Activity Tolerance Patient tolerated treatment well   Patient Left in bed;with call bell/phone within reach;with bed alarm set;with restraints reapplied   Nurse Communication Mobility status        Time: [id number] OT Time Calculation (min): 18 min  Charges: OT General Charges $OT Visit: 1 Visit OT Treatments $Therapeutic Activity: 8-22 mins  [place], OTR/L Acute Rehab Pager: [phone] Office: [place] 10/02/2019, 5:11 PM

## 2019-10-02 NOTE — Progress Notes (Signed)
Physical Therapy Treatment Patient Details Name: Joshua [name] MRN: [id number] DOB: 1981/04/27 Today's Date: 10/02/2019    History of Present Illness This pt is a 38 year old with history of alcohol abuse, seizures, noncompliant hypertension who presented with new seizure right-sided hemiplegia and confusion was found to have a large left intracranial hemorrhage now s/p left frontoparietal craniectomy for evacuation of left basal ganglia hematoma on 09/09/19. Skull flap in abdomen.  Intubated 9/12 (day of admission) and extubated 09/15/19.     PT Comments    Pt performed sitting edge of bed x 10 min with external assistance to maintain sitting and promote cervical extension and rotation.  He continues to lack right response in sitting and required passive ROM to B ankle and cervical ROM.  He was observed involuntarily moving LLE against gravity.  Will plan for progression of functional mobility during acute stay.  Remain hopeful for CIR admit.     Follow Up Recommendations  CIR     Equipment Recommendations  Wheelchair (measurements PT);Wheelchair cushion (measurements PT);Hospital bed;3in1 (PT)    Recommendations for Other Services Rehab consult     Precautions / Restrictions Precautions Precautions: Fall Precaution Comments: L crani, bone flap removed and in abdomen, NG, L wrist restraint and mitt Restrictions Weight Bearing Restrictions: No    Mobility  Bed Mobility Overal bed mobility: Needs Assistance Bed Mobility: Supine to Sit;Sit to Supine     Supine to sit: Total assist Sit to supine: Total assist   General bed mobility comments: Pt required total assistance for supine to sit and sit to supine.  he remains unable to follow commands.  Transfers                    Ambulation/Gait                 Stairs             Wheelchair Mobility    Modified Rankin (Stroke Patients Only)       Balance Overall balance assessment: Needs assistance   Sitting balance-Leahy Scale: Zero Sitting balance - Comments: external assistance to maintain sitting with flexed posture.  Focused on cervical extension with total assistance.                                    Cognition Arousal/Alertness: Lethargic Behavior During Therapy: Flat affect Overall Cognitive Status: Difficult to assess                                 General Comments: continues with no command following, very not interactive with PT but able to maintain  supported sitting at EOB      Exercises Other Exercises Other Exercises: PROM to B ankles 1x10 reps. Other Exercises: PROM for cervical rotaiton in supine 3x15 sec. Other Exercises: PROM cervical exetension to neutral in sitting.  3x5 sec holds.    General Comments        Pertinent Vitals/Pain Pain Assessment: Faces Faces Pain Scale: Hurts little more Pain Location: neck with motion Pain Descriptors / Indicators: Moaning Pain Intervention(s): Monitored during session;Repositioned    Home Living                      Prior Function            PT Goals (current goals can now  be found in the care plan section) Acute Rehab PT Goals Patient Stated Goal: none stated PT Goal Formulation: Patient unable to participate in goal setting Potential to Achieve Goals: Fair Progress towards PT goals: Progressing toward goals    Frequency    Min 4X/week      PT Plan Current plan remains appropriate    Co-evaluation              AM-PAC PT "6 Clicks" Mobility   Outcome Measure  Help needed turning from your back to your side while in a flat bed without using bedrails?: Total Help needed moving from lying on your back to sitting on the side of a flat bed without using bedrails?: Total Help needed moving to and from a bed to a chair (including a wheelchair)?: Total Help needed standing up from a chair using your arms (e.g., wheelchair or bedside chair)?: Total Help  needed to walk in hospital room?: Total Help needed climbing 3-5 steps with a railing? : Total 6 Click Score: 6    End of Session   Activity Tolerance: Patient tolerated treatment well Patient left: in bed;with call bell/phone within reach;with bed alarm set Nurse Communication: Mobility status PT Visit Diagnosis: Muscle weakness (generalized) (M62.81);Difficulty in walking, not elsewhere classified (R26.2);Hemiplegia and hemiparesis Hemiplegia - Right/Left: Right Hemiplegia - dominant/non-dominant: Dominant Hemiplegia - caused by: Nontraumatic intracerebral hemorrhage     Time: 1216-1241 PT Time Calculation (min) (ACUTE ONLY): 25 min  Charges:  $Therapeutic Activity: 23-37 mins                     Governor Rooks, PTA Acute Rehabilitation Services Pager [phone] Office [phone]     Joshua [name] [name] 10/02/2019, 12:55 PM

## 2019-10-02 NOTE — Plan of Care (Signed)
  Problem: Safety: Goal: Ability to remain free from injury will improve Outcome: Progressing   

## 2019-10-02 NOTE — Progress Notes (Addendum)
Subjective: Patient reports (aphasic)  Objective: Vital signs in last 24 hours: Temp:  [97.8 F (36.6 C)-98.9 F (37.2 C)] 98.6 F (37 C) (10/05 0418) Pulse Rate:  [99-124] 101 (10/05 0418) Resp:  [18-20] 18 (10/05 0418) BP: (98-147)/(82-113) 137/104 (10/05 0418) SpO2:  [95 %-100 %] 97 % (10/05 0418)  Intake/Output from previous day: 10/04 0701 - 10/05 0700 In: -  Out: 2000 [Urine:2000] Intake/Output this shift: No intake/output data recorded.  Awake, moving left side, no movement right. Neurologically unchanged this am. Not following commands.  Scalp incision healed, craniotomy defect full. Abdominal binder in place.   Lab Results: Recent Labs    10/01/19 0516  WBC 10.3  HGB 10.7*  HCT 33.5*  PLT 400   BMET Recent Labs    10/01/19 0516 10/02/19 0555  NA 141 136  K 5.2* 4.1  CL 103 103  CO2 25 21*  GLUCOSE 112* 111*  BUN 65* 10  CREATININE 2.44* 1.29*  CALCIUM 10.1 9.1    Studies/Results: No results found.  Assessment/Plan:   LOS: 23 days  Awaiting SNF. Supportive care continues.   [name] 10/02/2019, 7:44 AM  Patient is stable.  No change.

## 2019-10-02 NOTE — NC FL2 (Signed)
[place] LEVEL OF CARE SCREENING TOOL     IDENTIFICATION  [name] Birthdate: Jan 11, 1981 Sex: male Admission Date (Current Location): 09/09/2019  [facility] and [place] Number:  Herbalist and Address:  [facility], [place] [address] [place], [place] [address]      Provider Number: [id number]  Attending Physician Name and Address:  Jonetta Osgood, MD  Relative Name and Phone Number:       Current Level of Care: Hospital Recommended Level of Care: [place] Prior Approval Number:    Date Approved/Denied:   PASRR Number: [id number]  Discharge Plan: SNF    Current Diagnoses: Patient Active Problem List   Diagnosis Date Noted  . Acute respiratory failure ([place])   . Hemorrhagic stroke ([place])   . Cytotoxic brain edema ([place]) 09/10/2019  . ICH (intracerebral hemorrhage) ([place]) 09/09/2019  . Subdural hematoma ([place]) 09/09/2019  . Intracerebral hemorrhage ([place]) 09/09/2019    Orientation RESPIRATION BLADDER Height & Weight     (unable to assess)  Normal Incontinent Weight: 132 lb 4.4 oz (60 kg) Height:  '5\' 9"'  (175.3 cm)  BEHAVIORAL SYMPTOMS/MOOD NEUROLOGICAL BOWEL NUTRITION STATUS      Incontinent Feeding tube(peg tube, Osmolite 1.5 at 65 mL per hour, 1560 mL per day)  AMBULATORY STATUS COMMUNICATION OF NEEDS Skin   Total Care Non-Verbally Surgical wounds, Other (Comment)(closed right abdomen, liquid skin adhesive; open wound on penis, foam dressing, lift every shift to assess and change PRN)                       Personal Care Assistance Level of Assistance  Bathing, Feeding, Dressing Bathing Assistance: Maximum assistance Feeding assistance: Maximum assistance Dressing Assistance: Maximum assistance     Functional Limitations Info  Speech     Speech Info: Impaired    SPECIAL CARE FACTORS FREQUENCY  PT (By licensed PT), OT (By licensed OT), Speech therapy     PT Frequency:  5x/wk OT Frequency: 5x/wk     Speech Therapy Frequency: 5x/wk      Contractures Contractures Info: Not present    Additional Factors Info  Code Status, Allergies, Insulin Sliding Scale Code Status Info: Full Allergies Info: NKA   Insulin Sliding Scale Info: 0-15 units every 4 hours       Current Medications (10/02/2019):  This is the current hospital active medication list Current Facility-Administered Medications  Medication Dose Route Frequency Provider Last Rate Last Dose  . acetaminophen (TYLENOL) tablet 650 mg  650 mg Per Tube Q4H PRN Donzetta Starch, NP   650 mg at 09/29/19 0854   Or  . acetaminophen (TYLENOL) suppository 650 mg  650 mg Rectal Q4H PRN Burnetta Sabin L, NP      . alteplase (CATHFLO ACTIVASE) injection 2 mg  2 mg Intracatheter Once Donzetta Starch, NP      . amLODipine (NORVASC) tablet 10 mg  10 mg Per Tube Daily Donzetta Starch, NP   10 mg at 10/02/19 0856  . bisacodyl (DULCOLAX) suppository 10 mg  10 mg Rectal Daily PRN Burnetta Sabin L, NP      . chlorhexidine gluconate (MEDLINE KIT) (PERIDEX) 0.12 % solution 15 mL  15 mL Mouth Rinse BID Burnetta Sabin L, NP   15 mL at 10/02/19 0857  . feeding supplement (OSMOLITE 1.5 CAL) liquid 1,000 mL  1,000 mL Per Tube Continuous Donzetta Starch, NP 65 mL/hr at 10/02/19 1404 1,000  mL at 10/02/19 1404  . folic acid (FOLVITE) tablet 1 mg  1 mg Per Tube Daily Donzetta Starch, NP   1 mg at 10/02/19 0855  . free water 200 mL  200 mL Per Tube Q4H Burnetta Sabin L, NP   200 mL at 10/02/19 0857  . glycopyrrolate (ROBINUL) injection 0.1 mg  0.1 mg Intravenous TID Jonetta Osgood, MD   0.1 mg at 10/02/19 0854  . heparin injection 5,000 Units  5,000 Units Subcutaneous Q8H Donzetta Starch, NP   5,000 Units at 10/02/19 1406  . hydrALAZINE (APRESOLINE) injection 20 mg  20 mg Intravenous Q6H PRN Burnetta Sabin L, NP   20 mg at 09/15/19 1015  . insulin aspart (novoLOG) injection 0-15 Units  0-15 Units Subcutaneous Q4H Donzetta Starch, NP   2 Units at  10/02/19 1200  . labetalol (NORMODYNE) injection 10 mg  10 mg Intravenous Q2H PRN Ghimire, Henreitta Leber, MD      . labetalol (NORMODYNE) tablet 300 mg  300 mg Per Tube TID Rosalin Hawking, MD   300 mg at 10/02/19 0855  . levETIRAcetam (KEPPRA) 100 MG/ML solution 500 mg  500 mg Per Tube BID Burnetta Sabin L, NP   500 mg at 10/02/19 0856  . lipase/protease/amylase) Rosann Auerbach) tablets 20,880 Units  20,880 Units Per Tube Once Maczis, Barth Kirks, PA-C       And  . sodium bicarbonate tablet 650 mg  650 mg Per Tube Once Jillyn Ledger, PA-C      . MEDLINE mouth rinse  15 mL Mouth Rinse 10 times per day Donzetta Starch, NP   15 mL at 10/02/19 0914  . multivitamin liquid 15 mL  15 mL Per Tube Daily Burnetta Sabin L, NP   15 mL at 10/02/19 0854  . ondansetron (ZOFRAN) injection 4 mg  4 mg Intravenous Q4H PRN Donzetta Starch, NP   4 mg at 09/28/19 0750  . pantoprazole sodium (PROTONIX) 40 mg/20 mL oral suspension 40 mg  40 mg Per Tube BID Burnetta Sabin L, NP   40 mg at 10/02/19 0854  . polyethylene glycol (MIRALAX / GLYCOLAX) packet 17 g  17 g Oral Daily PRN Donzetta Starch, NP      . promethazine (PHENERGAN) tablet 12.5-25 mg  12.5-25 mg Oral Q4H PRN Donzetta Starch, NP      . QUEtiapine (SEROQUEL) tablet 50 mg  50 mg Per Tube BID Burnetta Sabin L, NP   50 mg at 10/02/19 0856  . sodium chloride flush (NS) 0.9 % injection 10-40 mL  10-40 mL Intracatheter Q12H Burnetta Sabin L, NP   10 mL at 10/02/19 0903  . sodium chloride flush (NS) 0.9 % injection 10-40 mL  10-40 mL Intracatheter PRN Donzetta Starch, NP      . thiamine (VITAMIN B-1) tablet 100 mg  100 mg Per Tube Daily Burnetta Sabin L, NP   100 mg at 10/02/19 0857  . traMADol (ULTRAM) tablet 25 mg  25 mg Per Tube Q12H PRN Jonetta Osgood, MD   25 mg at 09/28/19 1746     Discharge Medications: Please see discharge summary for a list of discharge medications.  Relevant Imaging Results:  Relevant Lab Results:   Additional Information SS#: [id number]  Geralynn Ochs, LCSW

## 2019-10-03 ENCOUNTER — Encounter (HOSPITAL_COMMUNITY): Payer: Self-pay | Admitting: *Deleted

## 2019-10-03 LAB — GLUCOSE, CAPILLARY
Glucose-Capillary: 118 mg/dL — ABNORMAL HIGH (ref 70–99)
Glucose-Capillary: 128 mg/dL — ABNORMAL HIGH (ref 70–99)
Glucose-Capillary: 128 mg/dL — ABNORMAL HIGH (ref 70–99)
Glucose-Capillary: 130 mg/dL — ABNORMAL HIGH (ref 70–99)
Glucose-Capillary: 134 mg/dL — ABNORMAL HIGH (ref 70–99)
Glucose-Capillary: 143 mg/dL — ABNORMAL HIGH (ref 70–99)
Glucose-Capillary: 88 mg/dL (ref 70–99)

## 2019-10-03 NOTE — Plan of Care (Signed)
  Problem: Activity: Goal: Risk for activity intolerance will decrease Outcome: Progressing   

## 2019-10-03 NOTE — Progress Notes (Signed)
  Speech Language Pathology Treatment: Dysphagia;Cognitive-Linquistic  Patient Details Name: Joshua [name] MRN: [id number] DOB: [birth year] Today's Date: 10/03/2019 Time: [id number] SLP Time Calculation (min) (ACUTE ONLY): 16 min  Assessment / Plan / Recommendation Clinical Impression  Pt demonstrates minimal arousal today with max interventions including total assist head positioning, eye opening. Elicited attention and responsiveness with hand over hand assist with wash cloth, toothbrush and cup. Pt did grasp items slightly in RUE and with total assist was sensed to activate arm musculature. Pt did respond to ice chip with left cheek with brief mastication, but did not complete mastication or oral transit. When honey thick liquids presented to lips via spoon and cup with LUE engagement for sensation of self feeding provided pt had improved oral response and eventual (very late) swallow response x3. Overall, responses appeared reflexive. Arousal not appropriate for more advanced trials. Will continue efforts.   HPI HPI: Pt is a 38 year old male who presented with new seizure, and was found to have a large left BG intracranial hemorrhage s/p L frontal temporal craniectomy with evacuation 9/12. ETT 9/12-9/18; 9/18-9/23. PMH: HTN, alcoholism, noncompliance      SLP Plan  Continue with current plan of care       Recommendations  Diet recommendations: NPO                Oral Care Recommendations: Oral care QID Follow up Recommendations: Skilled Nursing facility Plan: Continue with current plan of care       GO               Herbie Baltimore, MA [place] Pager [phone] Office [phone]  Lynann Beaver 10/03/2019, 10:06 AM

## 2019-10-03 NOTE — Progress Notes (Signed)
Nutrition Follow-up   RD working remotely.  DOCUMENTATION CODES:   Not applicable  INTERVENTION:  Continue Osmolite 1.5 formula via PEG at goal rate of 65 ml/hr.  Free water flushes of 200 ml every 4 hours per MD.   Tube feeding to provide 2340 kcal, 98 grams of protein, and 2386 ml free water.   NUTRITION DIAGNOSIS:   Inadequate oral intake related to inability to eat as evidenced by NPO status; ongoing  GOAL:   Patient will meet greater than or equal to 90% of their needs; met with TF  MONITOR:   TF tolerance, Weight trends  REASON FOR ASSESSMENT:   Ventilator, Consult Enteral/tube feeding initiation and management  ASSESSMENT:   38 yo male admitted with large left intracranial hemorrhage; S/P craniotomy. PMH includes HTN, alcoholism, noncompliance. 9/12 left frontal temporal craniectomy and evacuation hematoma. 9/23 Extubated. 10/1 PEG placed.   Pt continues on NPO status. Pt has been tolerating his tube feeds well. RD to continue with current orders and monitor for tolerance. Labs and medications reviewed.   Diet Order:   Diet Order            Diet NPO time specified  Diet effective midnight              EDUCATION NEEDS:   Not appropriate for education at this time  Skin:  Skin Assessment: Reviewed RN Assessment  Last BM:  10/4  Height:   Ht Readings from Last 1 Encounters:  09/09/19 _0  (1.753 m)    Weight:   Wt Readings from Last 1 Encounters:  09/29/19 60 kg    Ideal Body Weight:  72.7 kg  BMI:  Body mass index is 19.53 kg/m.  Estimated Nutritional Needs:   Kcal:  2000-2200  Protein:  95-105 gm  Fluid:  >/= 1.9 L    Corrin Parker, MS, RD, LDN Pager # [phone] After hours/ weekend pager # [phone]

## 2019-10-03 NOTE — Progress Notes (Signed)
PROGRESS NOTE        PATIENT DETAILS Name: Joshua [name] Age: 38 y.o. Sex: male Date of Birth: [birth year] Admit Date: 09/09/2019 Admitting Physician Erline Levine, MD [id number], [name]  Brief Narrative: Patient is a 38 y.o. male with history of HTN, seizure disorder, alcohol abuse-who with seizure and right-sided hemiplegia-upon further evaluation was found to have a large left intracranial hemorrhage.  Patient was intubated-admitted to the ICU.  Evaluated by neurosurgery-underwent left frontal temporal craniectomy and evacuation of the hematoma.  Upon further improvement-transferred to the triad hospitalist service on 9/30.  Significant events: 9/12>>Admitted/  operating room for left frontal temporal craniectomy and evacuation hematoma 9/18 extubated > reintubated due to protect airway and low oxygen 9/23 > Extubated  9/30>> transferred to triad hospitalist. 10/1>> PEG tube placement by CCS  Subjective: Lying comfortably in bed-no major issues overnight.  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  CTA head negative for aneurysm or vascular malformation.  Remains with dense right-sided hemiplegia and aphasia.  Patient is now s/p PEG tube placement.  CIR-appropriate for SNF-we will consult social work.  Aspiration pneumonia: Continue n.p.o. status-now on tube feedings-completed a course of antimicrobial therapy.  Due to severe dysphagia from CVA-remains at risk of aspiration-continue Robinul.  Have asked RN to always keep head end of the bed raised to more than 45 degrees.  Continue maximum aspiration precautions.  Dysphagia: Secondary to  CVA-underwent PEG tube placement on 10/1-tolerating PEG tube feedings.  AKI versus CKD stage IV: Suspect has underlying CKD stage III-probably from hypertensive nephrosclerosis.  Continue to follow electrolytes closely.  UA without proteinuria.  Renal ultrasound suggestive of chronic kidney disease.  .  EtOH abuse: No obvious signs of withdrawal-has completed a clonidine taper.  HTN: Stable-continue with Norvasc, labetalol.  Will follow and adjust accordingly.  Seizure: Continue Keppra  Nutrition Problem: Nutrition Problem: Inadequate oral intake Etiology: inability to eat Signs/Symptoms: NPO status Interventions: Refer to RD note for recommendations   Diet: Diet Order            Diet NPO time specified  Diet effective midnight               DVT Prophylaxis: Prophylactic Heparin   Code Status: Full code   Family Communication: None at bedside  Disposition Plan: Remain inpatient-SNF on discharge-not a CIR candidate.  Antimicrobial agents: Anti-infectives (From admission, onward)   Start     Dose/Rate Route Frequency Ordered Stop   09/27/19 1130  cephALEXin (KEFLEX) 250 MG/5ML suspension 500 mg     500 mg Per Tube Every 12 hours 09/27/19 1052 09/29/19 2250   09/25/19 1445  ceFAZolin (ANCEF) IVPB 2g/100 mL premix  Status:  Discontinued     2 g 200 mL/hr over 30 Minutes Intravenous Every 12 hours 09/25/19 1417 09/27/19 1052   09/14/19 1000  Ampicillin-Sulbactam (UNASYN) 3 g in sodium chloride 0.9 % 100 mL IVPB     3 g 200 mL/hr over 30 Minutes Intravenous Every 8 hours 09/14/19 0922 09/17/19 1816   09/13/19 1100  vancomycin (VANCOCIN) IVPB 750 mg/150 ml premix  Status:  Discontinued     750 mg 150 mL/hr over 60 Minutes Intravenous Every 24 hours 09/13/19 0728 09/13/19 0838   09/12/19 1100  vancomycin (VANCOCIN) IVPB 1000 mg/200 mL premix  Status:  Discontinued     1,000 mg 200 mL/hr over 60 Minutes Intravenous Every 24 hours 09/11/19 1034 09/13/19 0728   09/11/19  1130  piperacillin-tazobactam (ZOSYN) IVPB 3.375 g  Status:  Discontinued     3.375 g 12.5 mL/hr over 240 Minutes Intravenous Every 8 hours 09/11/19 1054 09/14/19 0845   09/11/19 1100  vancomycin (VANCOCIN) 1,500 mg in sodium chloride 0.9 % 500 mL IVPB     1,500 mg 250 mL/hr over 120 Minutes Intravenous  Once 09/11/19 1034 09/11/19 1900   09/11/19 1015  piperacillin-tazobactam (ZOSYN) IVPB 4.5 g  Status:  Discontinued     4.5 g 200 mL/hr over 30 Minutes Intravenous Every 8 hours 09/11/19 1010 09/11/19 1052   09/09/19 0845  ceFAZolin (ANCEF) IVPB 2g/100 mL premix     2 g 200 mL/hr over 30 Minutes Intravenous Every 8 hours 09/09/19 0842 09/09/19 1658      Procedures:  left frontal temporal craniectomy and evacuation hematoma>> 9/12 ETT > 9/12- 9/18 re-intubated 9/18-9/23 Right radial A-line 9/12 > 9/15 L cortrak 9/14 >> Right Brachial PICC 9/14 >> 9/19 10/1>> PEG tube placement by CCS  CONSULTS:  pulmonary/intensive care, neurology and Neurosurgery  Time spent: 15 minutes-Greater than 50% of this time was spent in counseling, explanation of diagnosis, planning of further management, and coordination of care.  MEDICATIONS: Scheduled Meds:  alteplase  2 mg Intracatheter Once   amLODipine  10 mg Per Tube Daily   chlorhexidine gluconate (MEDLINE KIT)  15 mL Mouth Rinse BID   folic acid  1 mg Per Tube Daily   free water  200 mL Per Tube Q4H   glycopyrrolate  0.1 mg Intravenous TID   heparin injection (subcutaneous)  5,000 Units Subcutaneous Q8H   insulin aspart  0-15 Units Subcutaneous Q4H   labetalol  300 mg Per Tube TID   levETIRAcetam  500 mg Per Tube BID   lipase/protease/amylase)  20,880 Units Per Tube Once   And   sodium bicarbonate  650 mg Per Tube Once   mouth rinse  15 mL Mouth Rinse 10 times per day   multivitamin  15 mL Per Tube Daily   pantoprazole sodium  40 mg Per Tube BID   QUEtiapine  50 mg Per Tube BID   sodium chloride flush  10-40 mL  Intracatheter Q12H   thiamine  100 mg Per Tube Daily   Continuous Infusions:  feeding supplement (OSMOLITE 1.5 CAL) 1,000 mL (10/03/19 0427)   PRN Meds:.acetaminophen **OR** acetaminophen, bisacodyl, hydrALAZINE, labetalol, [DISCONTINUED] ondansetron **OR** ondansetron (ZOFRAN) IV, polyethylene glycol, promethazine, sodium chloride flush, traMADol   PHYSICAL EXAM: Vital signs: Vitals:   10/02/19 1951 10/02/19 2352 10/03/19 0353 10/03/19 0758  BP: (!) 116/93 (!) 117/93 (!) 144/114 (!) 147/118  Pulse: (!) 108 (!) 112 (!) 112 (!) 108  Resp: '20 18 18 16  ' Temp: 98.7 F (37.1 C) 99.2 F (37.3 C) 98.7 F (37.1 C) 98.7 F (37.1 C)  TempSrc: Axillary Oral Oral Oral  SpO2: 100% 100% 100% 100%  Weight:      Height:       Filed Weights   09/27/19 0500 09/28/19 0333 09/29/19 0109  Weight: 60.8 kg 61.3 kg 60 kg   Body mass index is 19.53 kg/m.   Gen Exam:Alert and aphasic-really does not  respond to either verbal or physical commands. HEENT:atraumatic, normocephalic Chest: B/L clear to auscultation anteriorly CVS:S1S2 regular Abdomen:soft non tender, non distended Extremities:no edema Neurology: Right-sided hemiplegia Skin: no rash I have personally reviewed following labs and imaging studies  LABORATORY DATA: CBC: Recent Labs  Lab 09/28/19 0440 10/01/19 0516  WBC 10.5 10.3  HGB 10.7* 10.7*  HCT 33.1* 33.5*  MCV 91.2 91.0  PLT 432* 031    Basic Metabolic Panel: Recent Labs  Lab 09/28/19 0440 09/29/19 0436 10/01/19 0516 10/02/19 0555  NA 144 143 141 136  K 5.0 4.6 5.2* 4.1  CL 110 107 103 103  CO2 '24 25 25 ' 21*  GLUCOSE 115* 160* 112* 111*  BUN 58* 58* 65* 10  CREATININE 2.28* 2.43* 2.44* 1.29*  CALCIUM 10.1 9.8 10.1 9.1  MG  --   --  2.3  --   PHOS  --   --  6.3*  --     GFR: Estimated Creatinine Clearance: 65.2 mL/min (A) (by C-G formula based on SCr of 1.29 mg/dL (H)).  Liver Function Tests: No results for input(s): AST, ALT, ALKPHOS, BILITOT, PROT,  ALBUMIN in the last 168 hours. No results for input(s): LIPASE, AMYLASE in the last 168 hours. No results for input(s): AMMONIA in the last 168 hours.  Coagulation Profile: No results for input(s): INR, PROTIME in the last 168 hours.  Cardiac Enzymes: No results for input(s): CKTOTAL, CKMB, CKMBINDEX, TROPONINI in the last 168 hours.  BNP (last 3 results) No results for input(s): PROBNP in the last 8760 hours.  HbA1C: No results for input(s): HGBA1C in the last 72 hours.  CBG: Recent Labs  Lab 10/02/19 2353 10/03/19 0016 10/03/19 0400 10/03/19 0434 10/03/19 0755  GLUCAP 110* 128* 143* 118* 134*    Lipid Profile: No results for input(s): CHOL, HDL, LDLCALC, TRIG, CHOLHDL, LDLDIRECT in the last 72 hours.  Thyroid Function Tests: No results for input(s): TSH, T4TOTAL, FREET4, T3FREE, THYROIDAB in the last 72 hours.  Anemia Panel: No results for input(s): VITAMINB12, FOLATE, FERRITIN, TIBC, IRON, RETICCTPCT in the last 72 hours.  Urine analysis:    Component Value Date/Time   COLORURINE YELLOW (A) 09/23/2019 1108   APPEARANCEUR CLEAR (A) 09/23/2019 1108   LABSPEC 1.015 09/23/2019 1108   PHURINE 7.5 09/23/2019 1108   GLUCOSEU NEGATIVE 09/23/2019 1108   HGBUR NEGATIVE 09/23/2019 1108   [place] 09/23/2019 1108   KETONESUR NEGATIVE 09/23/2019 1108   PROTEINUR NEGATIVE 09/23/2019 1108   UROBILINOGEN 1.0 04/05/2009 0021   NITRITE NEGATIVE 09/23/2019 1108   LEUKOCYTESUR NEGATIVE 09/23/2019 1108    Sepsis Labs: Lactic Acid, Venous No results found for: LATICACIDVEN  MICROBIOLOGY: No results found for this or any previous visit (from the past 240 hour(s)).  RADIOLOGY STUDIES/RESULTS: Ct Angio Head W Or Wo Contrast  Result Date: 09/10/2019 CLINICAL DATA:  Intracranial hemorrhage follow up EXAM: CT HEAD WITHOUT CONTRAST CT ANGIOGRAPHY OF THE HEAD TECHNIQUE: Contiguous axial images were obtained from the base of the skull through the vertex without  intravenous contrast. Multidetector CT imaging of the head was performed using the standard protocol during bolus administration of intravenous contrast. Multiplanar CT image reconstructions and MIPs were obtained to evaluate the vascular anatomy. CONTRAST:  83m OMNIPAQUE IOHEXOL 350 MG/ML SOLN COMPARISON:  Head CT 09/09/2019 FINDINGS: CT HEAD FINDINGS Brain: Status post hematoma evacuation. Residual hematoma with interspersed postoperative gas measures 6.1 x 2.9 cm, centered in the left basal ganglia. Entrapment of the right lateral ventricle temporal horn has resolved.  Volume a blood within the lateral ventricles is unchanged. Punctate focus of hemorrhage at the posterior right insula is unchanged. There is now extra-axial blood over the left convexity underlying the craniectomy site. Midline shift has improved, now measuring 6 mm to the right. Skull: Left pterional craniectomy Sinuses/Orbits: Negative CTA HEAD FINDINGS POSTERIOR CIRCULATION: --Vertebral arteries: Normal V4 segments. --Posterior inferior cerebellar arteries (PICA): Patent origins from the vertebral arteries. --Anterior inferior cerebellar arteries (AICA): Patent origins from the basilar artery. --Basilar artery: Normal. --Superior cerebellar arteries: Normal. --Posterior cerebral arteries: Normal. Both originate from the basilar artery. Posterior communicating arteries (p-comm) are diminutive or absent. ANTERIOR CIRCULATION: --Intracranial internal carotid arteries: Normal. --Anterior cerebral arteries (ACA): Normal. Both A1 segments are present. Patent anterior communicating artery (a-comm). --Middle cerebral arteries (MCA): Normal. VENOUS SINUSES: As permitted by contrast timing, patent. ANATOMIC VARIANTS: None Review of the MIP images confirms the above findings. IMPRESSION: 1. Status post left pterional craniectomy for evacuation of left basal ganglia hematoma. Residual hematoma with interspersed postoperative gas measures 6.1 x 2.9 cm. 2.  Decreased midline shift, now measuring 6 mm to the right. 3. Unchanged punctate focus of hemorrhage at the posterior right insula. 4. Unchanged intraventricular blood. 5. No intracranial arterial occlusion or high-grade stenosis. No aneurysm or vascular malformation. Electronically Signed   By: Ulyses Jarred M.D.   On: 09/10/2019 05:58   Ct Head Wo Contrast  Result Date: 09/17/2019 CLINICAL DATA:  Intracranial hemorrhage follow up EXAM: CT HEAD WITHOUT CONTRAST TECHNIQUE: Contiguous axial images were obtained from the base of the skull through the vertex without intravenous contrast. COMPARISON:  Head CT 09/13/2019 FINDINGS: Brain: The size of the intraparenchymal hematoma centered in the left basal ganglia is unchanged. Surrounding edema and postsurgical changes also remain the same. There is a small amount of blood within both occipital horns. Rightward midline shift measures 6 mm at the level of the foramina Monro, unchanged. No new site of hemorrhage. Vascular: No abnormal hyperdensity of the major intracranial arteries or dural venous sinuses. No intracranial atherosclerosis. Skull: Left pterional craniectomy with soft tissue swelling overlying. Sinuses/Orbits: No fluid levels or advanced mucosal thickening of the visualized paranasal sinuses. No mastoid or middle ear effusion. The orbits are normal. IMPRESSION: 1. Unchanged size of intraparenchymal hematoma centered in the left basal ganglia with unchanged surrounding edema and mass effect. 2. Unchanged rightward midline shift, measuring 6 mm. Electronically Signed   By: Ulyses Jarred M.D.   On: 09/17/2019 01:46   Ct Head Wo Contrast  Result Date: 09/13/2019 CLINICAL DATA:  Stroke follow-up. EXAM: CT HEAD WITHOUT CONTRAST TECHNIQUE: Contiguous axial images were obtained from the base of the skull through the vertex without intravenous contrast. COMPARISON:  09/10/2019 and 09/09/2019 FINDINGS: Brain: Evidence of patient's recent evacuation of central  left-sided hemorrhagic stroke. Residual hemorrhage over the left basal ganglia measures approximately 2.8 x 6.5 cm without significant change. There is less air within the central aspect of the hemorrhage. Stable adjacent postsurgical change, edema and minimal mass effect. Stable mild midline shift to the right of 5-6 mm. Persistent 4 mm hemorrhagic focus over the right basal ganglia is becoming less dense. Vascular: No hyperdense vessel or unexpected calcification. Skull: Evidence of recent right frontal parietal craniectomy. Skin staples over the left scalp. Sinuses/Orbits: Orbits are normal. Paranasal sinuses are well aerated. Other: Periodontal disease. IMPRESSION: Stable changes post left frontoparietal craniectomy for evacuation of left-sided central hemorrhagic stroke. Residual without significant change measuring 2.8 x 6.5 cm with less air within the  hemorrhage. Stable postsurgical change with mild edema/mass effect and stable midline shift to the right of 5-6 mm. Decrease in density 4 mm focus of hemorrhage over the right basal ganglia. Electronically Signed   By: Marin Olp M.D.   On: 09/13/2019 08:24   Ct Head Wo Contrast  Result Date: 09/10/2019 CLINICAL DATA:  Intracranial hemorrhage follow up EXAM: CT HEAD WITHOUT CONTRAST CT ANGIOGRAPHY OF THE HEAD TECHNIQUE: Contiguous axial images were obtained from the base of the skull through the vertex without intravenous contrast. Multidetector CT imaging of the head was performed using the standard protocol during bolus administration of intravenous contrast. Multiplanar CT image reconstructions and MIPs were obtained to evaluate the vascular anatomy. CONTRAST:  41m OMNIPAQUE IOHEXOL 350 MG/ML SOLN COMPARISON:  Head CT 09/09/2019 FINDINGS: CT HEAD FINDINGS Brain: Status post hematoma evacuation. Residual hematoma with interspersed postoperative gas measures 6.1 x 2.9 cm, centered in the left basal ganglia. Entrapment of the right lateral ventricle  temporal horn has resolved. Volume a blood within the lateral ventricles is unchanged. Punctate focus of hemorrhage at the posterior right insula is unchanged. There is now extra-axial blood over the left convexity underlying the craniectomy site. Midline shift has improved, now measuring 6 mm to the right. Skull: Left pterional craniectomy Sinuses/Orbits: Negative CTA HEAD FINDINGS POSTERIOR CIRCULATION: --Vertebral arteries: Normal V4 segments. --Posterior inferior cerebellar arteries (PICA): Patent origins from the vertebral arteries. --Anterior inferior cerebellar arteries (AICA): Patent origins from the basilar artery. --Basilar artery: Normal. --Superior cerebellar arteries: Normal. --Posterior cerebral arteries: Normal. Both originate from the basilar artery. Posterior communicating arteries (p-comm) are diminutive or absent. ANTERIOR CIRCULATION: --Intracranial internal carotid arteries: Normal. --Anterior cerebral arteries (ACA): Normal. Both A1 segments are present. Patent anterior communicating artery (a-comm). --Middle cerebral arteries (MCA): Normal. VENOUS SINUSES: As permitted by contrast timing, patent. ANATOMIC VARIANTS: None Review of the MIP images confirms the above findings. IMPRESSION: 1. Status post left pterional craniectomy for evacuation of left basal ganglia hematoma. Residual hematoma with interspersed postoperative gas measures 6.1 x 2.9 cm. 2. Decreased midline shift, now measuring 6 mm to the right. 3. Unchanged punctate focus of hemorrhage at the posterior right insula. 4. Unchanged intraventricular blood. 5. No intracranial arterial occlusion or high-grade stenosis. No aneurysm or vascular malformation. Electronically Signed   By: KUlyses JarredM.D.   On: 09/10/2019 05:58   [place]  Result Date: 09/13/2019 CLINICAL DATA:  38year old male with acute renal insufficiency. EXAM: RENAL / URINARY TRACT ULTRASOUND COMPLETE COMPARISON:  None. FINDINGS: Evaluation is somewhat limited  due to inability of the patient to cooperate with the exam. Right Kidney: Renal measurements: 10.9 x 4.9 x 6.4 cm = volume: 179 mL. There is increased renal parenchymal echogenicity. There is mild hydronephrosis of the right kidney. No shadowing stone. Left Kidney: Renal measurements: 9.8 x 4.8 x 5.9 cm = volume: 145 mL. There is increased renal parenchymal echogenicity. Mild hydronephrosis. No shadowing stone. Bladder: The urinary bladder is distended otherwise unremarkable. Ureteral jets are not visualized. IMPRESSION: 1. Mildly echogenic kidneys likely related to underlying medical renal disease. 2. Mild bilateral hydronephrosis.  No shadowing stone. Electronically Signed   By: AAnner CreteM.D.   On: 09/13/2019 11:41   Dg Chest Port 1 View  Result Date: 09/23/2019 CLINICAL DATA:  Fever EXAM: PORTABLE CHEST 1 VIEW COMPARISON:  09/18/2019 FINDINGS: There has been interval endotracheal extubation. There is no acute appearing airspace opacity. Cardiomegaly. Partially imaged enteric feeding tube. IMPRESSION: 1. There has been interval endotracheal  extubation. There is no acute appearing airspace opacity. 2.  Cardiomegaly. Electronically Signed   By: Eddie Candle M.D.   On: 09/23/2019 13:18   Dg Chest Port 1 View  Result Date: 09/18/2019 CLINICAL DATA:  Intubation.  Seizure.  Head injury. EXAM: PORTABLE CHEST 1 VIEW COMPARISON:  09/17/2019. FINDINGS: Endotracheal tube, feeding tube in stable position position. PICC line tip noted over the right atrium. Stable cardiomegaly. Low lung volumes with right base atelectasis/infiltrate. No pleural effusion or pneumothorax. IMPRESSION: 1. Endotracheal tube, feeding tube in stable position. PICC line tip noted over the right atrium. 2.  Stable cardiomegaly. 3.  Low lung volumes with right base atelectasis/infiltrate. Electronically Signed   By: Marcello Moores  Register   On: 09/18/2019 06:54   Dg Chest Port 1 View  Result Date: 09/17/2019 CLINICAL DATA:  Respiratory  failure. Follow-up exam. Intubated patient. EXAM: PORTABLE CHEST 1 VIEW COMPARISON:  09/16/2019 and exams. FINDINGS: Basilar atelectasis has improved.  Lungs now clear. No pneumothorax or pleural effusion. Endotracheal tube, enteric tube and left PICC are stable. IMPRESSION: 1. Lungs now clear. 2. No new abnormalities. 3. Stable support apparatus. Electronically Signed   By: Lajean Manes M.D.   On: 09/17/2019 06:35   Dg Chest Port 1 View  Result Date: 09/16/2019 CLINICAL DATA:  Acute respiratory failure.  Follow-up exam. EXAM: PORTABLE CHEST 1 VIEW COMPARISON:  09/15/2019 and earlier exams. FINDINGS: Cardiac silhouette mildly enlarged. Atelectasis noted at the right lung base on the prior study has improved. Remainder of the lungs is clear. Endotracheal tube, left-sided PICC and enteric feeding tube are stable. IMPRESSION: 1. No acute cardiopulmonary disease. Right lung base atelectasis has improved. 2. Support apparatus is stable and well positioned. Electronically Signed   By: Lajean Manes M.D.   On: 09/16/2019 11:36   Dg Chest Port 1 View  Result Date: 09/15/2019 CLINICAL DATA:  Respiratory failure. EXAM: PORTABLE CHEST 1 VIEW COMPARISON:  Chest x-ray 09/10/2019. FINDINGS: Endotracheal tube noted with tip 3 cm above the carina. Feeding tube noted with tip below left hemidiaphragm. Left PICC line noted with tip at cavoatrial junction. Stable cardiomegaly. Progressive right lower lobe atelectasis/infiltrate. No pleural effusion or pneumothorax. IMPRESSION: 1.  Lines and tubes in good anatomic position. 2.  Progressive right lower lobe atelectasis/infiltrate. Electronically Signed   By: Marcello Moores  Register   On: 09/15/2019 08:14   Dg Chest Port 1 View  Result Date: 09/10/2019 CLINICAL DATA:  Endotracheal tube placement. EXAM: PORTABLE CHEST 1 VIEW COMPARISON:  09/09/2019 FINDINGS: Endotracheal tube has tip 4.1 cm above the carina. Enteric tube courses into the stomach and off the film as tip is not  visualized. Lungs are adequately inflated with mild stable elevation of the right hemidiaphragm. No focal lobar consolidation or effusion. Previous seen hazy density over the right midlung is improved to nearly resolved likely resolving atelectasis. Cardiomediastinal silhouette and remainder of the exam is unchanged. IMPRESSION: Resolving right midlung atelectasis. Tubes and lines as described. Electronically Signed   By: Marin Olp M.D.   On: 09/10/2019 08:41   Dg Chest Port 1 View  Result Date: 09/09/2019 CLINICAL DATA:  Respiratory failure. EXAM: PORTABLE CHEST 1 VIEW COMPARISON:  09/09/2019 FINDINGS: Endotracheal tube tip 5 cm above the carina. Enteric tube courses into the stomach and off the film as tip is not visualized. Lungs are adequately inflated with mild elevation of the right hemidiaphragm. There is new hazy airspace opacification over the right mid to lower lung likely atelectasis. Left lung is clear. Cardiomediastinal  silhouette and remainder of the exam is unchanged. IMPRESSION: New hazy opacification over the right midlung likely atelectasis. Tubes and lines as described. Electronically Signed   By: Marin Olp M.D.   On: 09/09/2019 11:22   Dg Chest Port 1 View  Result Date: 09/09/2019 CLINICAL DATA:  ET and OG placement EXAM: PORTABLE CHEST 1 VIEW COMPARISON:  None. FINDINGS: Transesophageal tube tip and side port distal to the GE junction terminating below the level of imaging. Endotracheal tube tip terminates appropriately in the mid trachea, approximately 5 cm from the carina. Additional support devices overlie the chest. No consolidation, features of edema, pneumothorax, or effusion. Pulmonary vascularity is normally distributed. The cardiomediastinal contours are unremarkable. No acute osseous or soft tissue abnormality. IMPRESSION: No acute cardiopulmonary abnormality. Appropriate positioning of the endotracheal and transesophageal tubes. Electronically Signed   By: Lovena Le  M.D.   On: 09/09/2019 03:30   Dg Chest Port 1v Same Day  Result Date: 09/28/2019 CLINICAL DATA:  Short of breath.  Code stroke EXAM: PORTABLE CHEST 1 VIEW COMPARISON:  09/23/2019 FINDINGS: Cardiac silhouette borderline enlarged. No mediastinal or hilar masses or evidence of adenopathy. Clear lungs.  No pleural effusion or pneumothorax. Skeletal structures are grossly intact. IMPRESSION: No acute cardiopulmonary disease. Electronically Signed   By: Lajean Manes M.D.   On: 09/28/2019 19:34   Dg Abd Portable 1v  Result Date: 09/21/2019 CLINICAL DATA:  Feeding tube placement EXAM: PORTABLE ABDOMEN - 1 VIEW COMPARISON:  None. FINDINGS: Feeding tube coils in the mid stomach. Nonobstructive bowel gas pattern. IMPRESSION: Feeding tube coils in the mid stomach. Electronically Signed   By: Rolm Baptise M.D.   On: 09/21/2019 21:11   Ct Head Code Stroke Wo Contrast  Result Date: 09/09/2019 CLINICAL DATA:  Code stroke.  Left-sided weakness EXAM: CT HEAD WITHOUT CONTRAST TECHNIQUE: Contiguous axial images were obtained from the base of the skull through the vertex without intravenous contrast. COMPARISON:  None. FINDINGS: Brain: There is an intraparenchymal hematoma centered in the left basal ganglia measuring 7.8 x 4.8 cm. There is rightward midline shift that measures 1.4 cm. There is early subfalcine herniation. Hemorrhage has extended into the lateral ventricles and third ventricle. There is a small focus of intraparenchymal hemorrhage at the right basal ganglia measuring approximately 4 mm. There is right ventricular entrapment with early dilatation of the temporal horn. Basal cisterns are effaced. Vascular: No hyperdense vessel or unexpected calcification. Skull: Normal. Negative for fracture or focal lesion. Sinuses/Orbits: No acute finding. Other: None. ASPECTS [facility] Stroke Program Early CT Score) is not applicable in the case of acute hemorrhage. IMPRESSION: 1. 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 2. Intraventricular extension of hemorrhage. Small focus of intraparenchymal hemorrhage in the right basal ganglia. Critical Value/emergent results were called by telephone at the time of interpretation on 09/09/2019 at 3:21 am to provider Dr. Kerney Elbe, Who verbally acknowledged these results. Electronically Signed   By: Ulyses Jarred M.D.   On: 09/09/2019 03:42   [place] Ekg Site Rite  Result Date: 09/11/2019 If Site Rite image not attached, placement could not be confirmed due to current cardiac rhythm.    LOS: 24 days   Oren Binet, MD  [facility]  If 7PM-7AM, please contact night-coverage  Please page via [email or web address]  Go to [email or web address] and use [place]'s universal password to access. If you do not have the password,  please contact the hospital operator.  Locate the [facility] provider you are looking for under [facility] and page to a number that you can be directly reached. If you still have difficulty reaching the provider, please page the [facility] (Director on Call) for the Hospitalists listed on amion for assistance.  10/03/2019, 10:52 AM

## 2019-10-03 NOTE — Progress Notes (Addendum)
Subjective: Patient reports (aphasic)  Objective: Vital signs in last 24 hours: Temp:  [98.7 F (37.1 C)-99.2 F (37.3 C)] 98.7 F (37.1 C) (10/06 0353) Pulse Rate:  [97-115] 112 (10/06 0353) Resp:  [18-20] 18 (10/06 0353) BP: (116-153)/(93-114) 144/114 (10/06 0353) SpO2:  [100 %] 100 % (10/06 0353)  Intake/Output from previous day: 10/05 0701 - 10/06 0700 In: 0  Out: 1550 [Urine:1550] Intake/Output this shift: No intake/output data recorded.  Awake, without chanage neurologically. Not following commands. Moves left side, withdraws right. Incisions healed.   Lab Results: Recent Labs    10/01/19 0516  WBC 10.3  HGB 10.7*  HCT 33.5*  PLT 400   BMET Recent Labs    10/01/19 0516 10/02/19 0555  NA 141 136  K 5.2* 4.1  CL 103 103  CO2 25 21*  GLUCOSE 112* 111*  BUN 65* 10  CREATININE 2.44* 1.29*  CALCIUM 10.1 9.1    Studies/Results: No results found.  Assessment/Plan:   LOS: 24 days  Supportive care continues.    [name] 10/03/2019, 7:30 AM  Patient is stable.

## 2019-10-04 LAB — GLUCOSE, CAPILLARY
Glucose-Capillary: 100 mg/dL — ABNORMAL HIGH (ref 70–99)
Glucose-Capillary: 122 mg/dL — ABNORMAL HIGH (ref 70–99)
Glucose-Capillary: 126 mg/dL — ABNORMAL HIGH (ref 70–99)
Glucose-Capillary: 132 mg/dL — ABNORMAL HIGH (ref 70–99)
Glucose-Capillary: 133 mg/dL — ABNORMAL HIGH (ref 70–99)
Glucose-Capillary: 144 mg/dL — ABNORMAL HIGH (ref 70–99)

## 2019-10-04 LAB — BASIC METABOLIC PANEL
Anion gap: 12 (ref 5–15)
BUN: 75 mg/dL — ABNORMAL HIGH (ref 6–20)
CO2: 26 mmol/L (ref 22–32)
Calcium: 10.2 mg/dL (ref 8.9–10.3)
Chloride: 103 mmol/L (ref 98–111)
Creatinine, Ser: 2.05 mg/dL — ABNORMAL HIGH (ref 0.61–1.24)
GFR calc Af Amer: 46 mL/min — ABNORMAL LOW (ref >60)
GFR calc non Af Amer: 40 mL/min — ABNORMAL LOW (ref >60)
Glucose, Bld: 134 mg/dL — ABNORMAL HIGH (ref 70–99)
Potassium: 5.5 mmol/L — ABNORMAL HIGH (ref 3.5–5.1)
Sodium: 141 mmol/L (ref 135–145)

## 2019-10-04 MED ORDER — HYDROCHLOROTHIAZIDE 10 MG/ML ORAL SUSPENSION
12.5000 mg | Freq: Every day | ORAL | Status: DC
  Administered 2019-10-04 – 2019-10-12 (×9): 13 mg
  Filled 2019-10-04 (×9): qty 1.88

## 2019-10-04 MED ORDER — HYDROCHLOROTHIAZIDE 12.5 MG PO CAPS
12.5000 mg | ORAL_CAPSULE | Freq: Every day | ORAL | Status: DC
  Filled 2019-10-04: qty 1

## 2019-10-04 NOTE — Progress Notes (Addendum)
Subjective: Patient reports (aphasic)  Objective: Vital signs in last 24 hours: Temp:  [97.6 F (36.4 C)-99.2 F (37.3 C)] 98.1 F (36.7 C) (10/07 0343) Pulse Rate:  [100-110] 110 (10/07 0343) Resp:  [16-18] 18 (10/07 0343) BP: (136-147)/(96-118) 136/109 (10/07 0343) SpO2:  [99 %-100 %] 99 % (10/07 0343) Weight:  [57.7 kg] 57.7 kg (10/07 0500)  Intake/Output from previous day: 10/06 0701 - 10/07 0700 In: 0  Out: 1325 [Urine:1325] Intake/Output this shift: No intake/output data recorded.  Awake, moving left side purposefully, no movement right side. Not following commands. Without change.  Lab Results: No results for input(s): WBC, HGB, HCT, PLT in the last 72 hours. BMET Recent Labs    10/02/19 0555  NA 136  K 4.1  CL 103  CO2 21*  GLUCOSE 111*  BUN 10  CREATININE 1.29*  CALCIUM 9.1    Studies/Results: No results found.  Assessment/Plan:   LOS: 25 days  Supportive care continues   [name] 10/04/2019, 7:58 AM  No change

## 2019-10-04 NOTE — Progress Notes (Addendum)
Physical Therapy Treatment Patient Details Name: Joshua [name] MRN: [id number] DOB: 04-09-1981 Today's Date: 10/04/2019    History of Present Illness This pt is a 38 year old with history of alcohol abuse, seizures, noncompliant hypertension who presented with new seizure right-sided hemiplegia and confusion was found to have a large left intracranial hemorrhage now s/p left frontoparietal craniectomy for evacuation of left basal ganglia hematoma on 09/09/19. Skull flap in abdomen.  Intubated 9/12 (day of admission) and extubated 09/15/19.     PT Comments    Pt tolerated EOB with PT/OT co session, continues to have inconsistent, non-reliable command following due to spontaneous movement of his L UE/LE.  PT/OT attempted standing, but pt did not initiate motion forward or up, total assist +3 to stand safely EOB.  CIR denied, so updated recommendation to SNF.  Goal update due next session.    Follow Up Recommendations  SNF(denied CIR)     Equipment Recommendations  Wheelchair (measurements PT);Wheelchair cushion (measurements PT);Hospital bed;3in1 (PT)    Recommendations for Other Services   NA     Precautions / Restrictions Precautions Precautions: Fall Precaution Comments: L crani, bone flap removed and in abdomen, NG, L mitt Restrictions Weight Bearing Restrictions: No    Mobility  Bed Mobility Overal bed mobility: Needs Assistance Bed Mobility: Supine to Sit;Sit to Supine;Rolling Rolling: Total assist;+2 for physical assistance;+2 for safety/equipment   Supine to sit: Total assist;+2 for physical assistance;+2 for safety/equipment Sit to supine: Total assist;+2 for physical assistance;+2 for safety/equipment   General bed mobility comments: Total A to bring BLEs towards EOB and then elevate trunk. Total A for rolling in bed to clean after BM.  Hand over hand to reach for rail, but grasp not maintained.   Transfers Overall transfer level: Needs assistance Equipment used: 2  person hand held assist Transfers: Sit to/from Stand Sit to Stand: Total assist;+2 physical assistance         General transfer comment: Total A +2 to power up into standing with third person blocking bilateral knees, other therapist under bil arms "three musketeers"         Balance Overall balance assessment: Needs assistance Sitting-balance support: Feet supported Sitting balance-Leahy Scale: Zero Sitting balance - Comments: Pushing to the right and posteriorly, up to total assist.                                      Cognition Arousal/Alertness: Lethargic(Keeping eyes closed at first) Behavior During Therapy: Flat affect Overall Cognitive Status: Difficult to assess                                 General Comments: Attempting to assess following commands. Pt squeezing therapists hand and touching his head with LUE, but not following further cues. Pt also cues these motions without cues, so decreased reliability of testing command following.       Exercises Other Exercises: PROM cervical exetension to neutral in sitting.  3x5 sec holds combined with left rotation, pt left on his left side to encourage midline/left gaze and rotation.      General Comments General comments (skin integrity, edema, etc.): Resting HR 115. HR elevating to 130 at EOB with increased DOE as the HR increased (? increased anxiety?)      Pertinent Vitals/Pain Pain Assessment: Faces Faces Pain Scale: No hurt Pain Intervention(s): Monitored during  session           PT Goals (current goals can now be found in the care plan section) Acute Rehab PT Goals Patient Stated Goal: none stated Progress towards PT goals: Progressing toward goals    Frequency    Min 3X/week      PT Plan Discharge plan needs to be updated;Frequency needs to be updated    Co-evaluation PT/OT/SLP Co-Evaluation/Treatment: Yes Reason for Co-Treatment: Complexity of the patient's  impairments (multi-system involvement);Necessary to address cognition/behavior during functional activity;For patient/therapist safety;To address functional/ADL transfers PT goals addressed during session: Mobility/safety with mobility;Balance OT goals addressed during session: ADL's and self-care      AM-PAC PT "6 Clicks" Mobility   Outcome Measure  Help needed turning from your back to your side while in a flat bed without using bedrails?: Total Help needed moving from lying on your back to sitting on the side of a flat bed without using bedrails?: Total Help needed moving to and from a bed to a chair (including a wheelchair)?: Total Help needed standing up from a chair using your arms (e.g., wheelchair or bedside chair)?: Total Help needed to walk in hospital room?: Total Help needed climbing 3-5 steps with a railing? : Total 6 Click Score: 6    End of Session Equipment Utilized During Treatment: Gait belt Activity Tolerance: Patient tolerated treatment well Patient left: in bed;with call bell/phone within reach;Other (comment)(with OT/OT student)   PT Visit Diagnosis: Muscle weakness (generalized) (M62.81);Difficulty in walking, not elsewhere classified (R26.2);Hemiplegia and hemiparesis Hemiplegia - Right/Left: Right Hemiplegia - dominant/non-dominant: Dominant Hemiplegia - caused by: Nontraumatic intracerebral hemorrhage     Time: 1016-1100 PT Time Calculation (min) (ACUTE ONLY): 44 min  Charges:  $Therapeutic Activity: 8-22 mins                    Zacchaeus Halm B. Ahaana Rochette, PT, DPT  Acute Rehabilitation [phone] pager [phone] office  @ [place]: [phone]   10/04/2019, 12:20 PM

## 2019-10-04 NOTE — Progress Notes (Signed)
Occupational Therapy Treatment Patient Details Name: Joshua [name] MRN: [id number] DOB: September 05, 1981 Today's Date: 10/04/2019    History of present illness This pt is a 38 year old with history of alcohol abuse, seizures, noncompliant hypertension who presented with new seizure right-sided hemiplegia and confusion was found to have a large left intracranial hemorrhage now s/p left frontoparietal craniectomy for evacuation of left basal ganglia hematoma on 09/09/19. Skull flap in abdomen.  Intubated 9/12 (day of admission) and extubated 09/15/19.    OT comments  Pt continues to present with decreased balance, strength, functional use of RUE, attention to right side, and cognition. Pt requiring Total A +2 for bed mobility and sitting at EOB with Max A for balance. Pt requiring Total A for performance of grooming tasks at EOB and continues to push his LUE aware into extension. Performed splint check noting no redness or edema; pt with increased PROM with splint wear. Reviewed splint management with RN. Update dc recommendation to SNF as feel pt would benefit from longer post-acute rehab. Will continue to follow acutely as admitted.    Follow Up Recommendations  SNF;Supervision/Assistance - 24 hour    Equipment Recommendations  Other (comment)(Defer to next venue)    Recommendations for Other Services Rehab consult    Precautions / Restrictions Precautions Precautions: Fall Precaution Comments: L crani, bone flap removed and in abdomen, NG, L wrist restraint and mitt Restrictions Weight Bearing Restrictions: No       Mobility Bed Mobility Overal bed mobility: Needs Assistance Bed Mobility: Supine to Sit;Sit to Supine;Rolling Rolling: Total assist;+2 for physical assistance;+2 for safety/equipment   Supine to sit: Total assist;+2 for physical assistance;+2 for safety/equipment Sit to supine: Total assist;+2 for physical assistance;+2 for safety/equipment   General bed mobility comments:  Total A to bring BLEs towards EOB and then elevate trunk. Total A for rolling in bed to clean after BM  Transfers Overall transfer level: Needs assistance Equipment used: 2 person hand held assist Transfers: Sit to/from Stand Sit to Stand: Total assist;+2 physical assistance         General transfer comment: Total A +2 to power up into standing with third person blocking bilateral knees    Balance Overall balance assessment: Needs assistance Sitting-balance support: Feet supported Sitting balance-Leahy Scale: Zero Sitting balance - Comments: Pushing to the right                                   ADL either performed or assessed with clinical judgement   ADL Overall ADL's : Needs assistance/impaired     Grooming: Wash/dry face;Total assistance;Sitting Grooming Details (indicate cue type and reason): Pt requiring max A for sitting balance at EOB as he pushed to the right. Pt with decreased following cues and participating. Pt pushing wash clothe and tooth brush away from her face.                      Toileting- Clothing Manipulation and Hygiene: Total assistance;Bed level Toileting - Clothing Manipulation Details (indicate cue type and reason): Total A for toilet hygiene at bed level after bowel incontience     Functional mobility during ADLs: Total assistance;+2 for physical assistance;+2 for safety/equipment(sit<>stand) General ADL Comments: Focused session on splint check and ROM exercises.      Vision   Vision Assessment?: Vision impaired- to be further tested in functional context;Yes Additional Comments: Significant gaze to right despite head turns  Perception     Praxis      Cognition Arousal/Alertness: Lethargic(Keeping eyes closed at first) Behavior During Therapy: Flat affect Overall Cognitive Status: Difficult to assess                                 General Comments: Attempting to assess following commands. Pt  squeezing therapists hand and touching his head with LUE, but not following further cues. Pt also cues these motions without cues.         Exercises Exercises: General Upper Extremity General Exercises - Upper Extremity Elbow Extension: PROM;Right;Supine;10 reps Wrist Flexion: PROM;Right;Supine;10 reps Wrist Extension: PROM;Right;Supine;10 reps Digit Composite Flexion: PROM;Right;Supine;10 reps Composite Extension: PROM;Right;Supine;10 reps Other Exercises Other Exercises: Performed splint check for resting hadn and elbow splint. No redness or edema noted Other Exercises: PROM cervical exetension to neutral in sitting.  3x5 sec holds.   Shoulder Instructions       General Comments Resting HR 115. HR elevating to 130 at EOB    Pertinent Vitals/ Pain       Pain Assessment: Faces Faces Pain Scale: No hurt Pain Intervention(s): Monitored during session  Home Living                                          Prior Functioning/Environment              Frequency  Min 2X/week        Progress Toward Goals  OT Goals(current goals can now be found in the care plan section)  Progress towards OT goals: Progressing toward goals  Acute Rehab OT Goals Patient Stated Goal: none stated OT Goal Formulation: Patient unable to participate in goal setting Time For Goal Achievement: 10/06/19 Potential to Achieve Goals: Good ADL Goals Pt Will Perform Grooming: with min assist;sitting Pt/caregiver will Perform Home Exercise Program: Increased ROM;Increased strength;Right Upper extremity;With minimal assist Additional ADL Goal #1: Pt will maintain static balance sitting EOB with modA to increase independence with ADL task. Additional ADL Goal #2: Pt will track towards R visual field with no more than mod cues. Additional ADL Goal #3: Pt will follow one step commands with 50% accuracy during functional task.  Plan Discharge plan remains appropriate     Co-evaluation    PT/OT/SLP Co-Evaluation/Treatment: Yes Reason for Co-Treatment: Complexity of the patient's impairments (multi-system involvement);To address functional/ADL transfers;For patient/therapist safety   OT goals addressed during session: ADL's and self-care      AM-PAC OT "6 Clicks" Daily Activity     Outcome Measure   Help from another person eating meals?: Total Help from another person taking care of personal grooming?: Total Help from another person toileting, which includes using toliet, bedpan, or urinal?: Total Help from another person bathing (including washing, rinsing, drying)?: Total Help from another person to put on and taking off regular upper body clothing?: Total Help from another person to put on and taking off regular lower body clothing?: Total 6 Click Score: 6    End of Session Equipment Utilized During Treatment: Other (comment)(Resting hand splint and elbow splint)  OT Visit Diagnosis: Other abnormalities of gait and mobility (R26.89);Hemiplegia and hemiparesis;Cognitive communication deficit (R41.841) Symptoms and signs involving cognitive functions: Other Nontraumatic ICH Hemiplegia - Right/Left: Right Hemiplegia - caused by: Other Nontraumatic intracranial hemorrhage   Activity Tolerance Patient tolerated  treatment well   Patient Left in bed;with call bell/phone within reach;with bed alarm set;with restraints reapplied   Nurse Communication Mobility status        Time: 1015-1056 OT Time Calculation (min): 41 min  Charges: OT General Charges $OT Visit: 1 Visit OT Treatments $Self Care/Home Management : 8-22 mins $Therapeutic Activity: 8-22 mins  [place], OTR/L Acute Rehab Pager: [phone] Office: [place] 10/04/2019, 11:24 AM

## 2019-10-04 NOTE — Progress Notes (Signed)
PROGRESS NOTE    Joshua [name]  JSE:[id number] DOB: 29-Oct-1981 DOA: 09/09/2019 PCP: Patient, [name]      Brief Narrative:  Mr. Milner is a 38 y.o. M with no significant PMHx but apparently untreated severe HTN who presented with seizure-like activity, right-sided hemiparesis.  In the ER, Cr 2.29, K 2.4, CT head showed a large intraparenchymal hematoma and he was intubated.  Neurosurgery admitted the patient for emergent decompression.       Assessment & Plan:  Large LEFT intraparenchymal hemorrhage S/p decompressive craniectomy wth hematoma evacuation by Dr. Vertell Limber 9/12 PEG dependent Continues to have dense right sided hemiplegia and aphasia. 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  Remained 2.1-2.8 for three weeks.  Two days ago, Cr down to 1.3. -Repeat BMP  Hypertension No prior treatment.  Last BP in our system 156/100 in 2013. Still with elevated diastolic pressures. -Continue amlodipine, labetalol -Start HCTZ -Daily BMP for 3 days  Seizure  Secondary to [place].  No recent seizure activity -Continue Keppra  Possible alcohol withdrawal Completed clonidine taper  -Continue folate  Acute metabolic acidosis -Continue Bicarb -Repeat BMP tomrorow       MDM and disposition: The below labs and imaging reports were reviewed and summarized above.  Medication management as above.  The patient was admitted with large intraparenchymal hemorrhage. The patient is now completely disabled, dependent for all ADLs, requiring feeding by PEG tube and unable to make needs known.  He requires total care, and attempts for long-term placement are ongoing.         DVT prophylaxis: Heparin Code Status: FULL Family Communication:     Consultants:   Neurosurgery  Procedures:    9/12 intubation  9/12 craniotomy  9/18 extubation  9/18 re-intubation  9/23 extubation  10/1 EGD PEG placement    Antimicrobials:   Vancomycin 9/14 >> 9/15   Zosyn --> Unasyn 9/14 >> 9/20  Cefazolin --> Cephalexin 9/28 >> 10/2    Subjective: No nursing concerns.  Patient unable to make needs known.  No fever, respiratory distress, diarrhea.  Objective: Vitals:   10/04/19 0047 10/04/19 0343 10/04/19 0500 10/04/19 0837  BP: (!) 138/112 (!) 136/109  (!) 134/124  Pulse: (!) 104 (!) 110  (!) 122  Resp: '18 18  20  ' Temp: 98.3 F (36.8 C) 98.1 F (36.7 C)  98.3 F (36.8 C)  TempSrc: Oral Oral  Oral  SpO2: 100% 99%  100%  Weight:   57.7 kg   Height:        Intake/Output Summary (Last 24 hours) at 10/04/2019 0849 Last data filed at 10/04/2019 0048 Gross per 24 hour  Intake 0 ml  Output 1325 ml  Net -1325 ml   Filed Weights   09/28/19 0333 09/29/19 0109 10/04/19 0500  Weight: 61.3 kg 60 kg 57.7 kg    Examination: General appearance: thin adult male, lying in bed, does not seem alert to my presence HEENT: Anicteric, conjunctiva pink, lids and lashes normal. No nasal deformity, discharge, epistaxis.  Lips dry, does not open mouth to command.   Skin: Warm and dry.   No suspicious rashes or lesions. Cardiac: Tachycardic, regular, nl S1-S2, no murmurs appreciated.  Capillary refill is brisk.  JVP normal.  No LE edema.  Radial pulses 2+ and symmetric. Respiratory: Normal respiratory rate and rhythm.  CTAB without rales  or wheezes. Abdomen: Abdomen soft.  No grimace to palpation. No ascites, distension, hepatosplenomegaly.   MSK: Contractures, diffuse loss of subcutaneous muscle mass and fat. Neuro: Eyes open but does not consistently track me, does not follow any commands.  No spontaneous verbalizations. Psych: Unable to assess.    Data Reviewed: I have personally reviewed following labs and imaging studies:  CBC: Recent Labs  Lab 09/28/19 0440 10/01/19 0516   WBC 10.5 10.3  HGB 10.7* 10.7*  HCT 33.1* 33.5*  MCV 91.2 91.0  PLT 432* 846   Basic Metabolic Panel: Recent Labs  Lab 09/28/19 0440 09/29/19 0436 10/01/19 0516 10/02/19 0555  NA 144 143 141 136  K 5.0 4.6 5.2* 4.1  CL 110 107 103 103  CO2 '24 25 25 ' 21*  GLUCOSE 115* 160* 112* 111*  BUN 58* 58* 65* 10  CREATININE 2.28* 2.43* 2.44* 1.29*  CALCIUM 10.1 9.8 10.1 9.1  MG  --   --  2.3  --   PHOS  --   --  6.3*  --    GFR: Estimated Creatinine Clearance: 62.7 mL/min (A) (by C-G formula based on SCr of 1.29 mg/dL (H)). Liver Function Tests: No results for input(s): AST, ALT, ALKPHOS, BILITOT, PROT, ALBUMIN in the last 168 hours. No results for input(s): LIPASE, AMYLASE in the last 168 hours. No results for input(s): AMMONIA in the last 168 hours. Coagulation Profile: No results for input(s): INR, PROTIME in the last 168 hours. Cardiac Enzymes: No results for input(s): CKTOTAL, CKMB, CKMBINDEX, TROPONINI in the last 168 hours. BNP (last 3 results) No results for input(s): PROBNP in the last 8760 hours. HbA1C: No results for input(s): HGBA1C in the last 72 hours. CBG: Recent Labs  Lab 10/03/19 1219 10/03/19 1538 10/03/19 2019 10/04/19 0010 10/04/19 0424  GLUCAP 130* 128* 88 122* 100*   Lipid Profile: No results for input(s): CHOL, HDL, LDLCALC, TRIG, CHOLHDL, LDLDIRECT in the last 72 hours. Thyroid Function Tests: No results for input(s): TSH, T4TOTAL, FREET4, T3FREE, THYROIDAB in the last 72 hours. Anemia Panel: No results for input(s): VITAMINB12, FOLATE, FERRITIN, TIBC, IRON, RETICCTPCT in the last 72 hours. Urine analysis:    Component Value Date/Time   COLORURINE YELLOW (A) 09/23/2019 1108   APPEARANCEUR CLEAR (A) 09/23/2019 1108   LABSPEC 1.015 09/23/2019 1108   PHURINE 7.5 09/23/2019 1108   GLUCOSEU NEGATIVE 09/23/2019 1108   HGBUR NEGATIVE 09/23/2019 1108   [place] 09/23/2019 1108   [place] 09/23/2019 1108   PROTEINUR NEGATIVE  09/23/2019 1108   UROBILINOGEN 1.0 04/05/2009 0021   NITRITE NEGATIVE 09/23/2019 1108   LEUKOCYTESUR NEGATIVE 09/23/2019 1108   Sepsis Labs: '@LABRCNTIP' (procalcitonin:4,lacticacidven:4)  )No results found for this or any previous visit (from the past 240 hour(s)).       Radiology Studies: No results found.      Scheduled Meds: . alteplase  2 mg Intracatheter Once  . amLODipine  10 mg Per Tube Daily  . chlorhexidine gluconate (MEDLINE KIT)  15 mL Mouth Rinse BID  . folic acid  1 mg Per Tube Daily  . free water  200 mL Per Tube Q4H  . glycopyrrolate  0.1 mg Intravenous TID  . heparin injection (subcutaneous)  5,000 Units Subcutaneous Q8H  . insulin aspart  0-15 Units Subcutaneous Q4H  . labetalol  300 mg Per Tube TID  . levETIRAcetam  500 mg Per Tube BID  . lipase/protease/amylase)  20,880 Units Per Tube Once   And  . sodium bicarbonate  650  mg Per Tube Once  . mouth rinse  15 mL Mouth Rinse 10 times per day  . multivitamin  15 mL Per Tube Daily  . pantoprazole sodium  40 mg Per Tube BID  . QUEtiapine  50 mg Per Tube BID  . sodium chloride flush  10-40 mL Intracatheter Q12H  . thiamine  100 mg Per Tube Daily   Continuous Infusions: . feeding supplement (OSMOLITE 1.5 CAL) 1,000 mL (10/03/19 0427)     LOS: 25 days    Time spent: 25 minutes    Edwin Dada, MD [facility] 10/04/2019, 8:49 AM     Please page through [place]:  [email or web address] Password [email or web address] If 7PM-7AM, please contact night-coverage

## 2019-10-05 LAB — BASIC METABOLIC PANEL
Anion gap: 14 (ref 5–15)
BUN: 79 mg/dL — ABNORMAL HIGH (ref 6–20)
CO2: 26 mmol/L (ref 22–32)
Calcium: 10.4 mg/dL — ABNORMAL HIGH (ref 8.9–10.3)
Chloride: 100 mmol/L (ref 98–111)
Creatinine, Ser: 2.15 mg/dL — ABNORMAL HIGH (ref 0.61–1.24)
GFR calc Af Amer: 43 mL/min — ABNORMAL LOW (ref >60)
GFR calc non Af Amer: 37 mL/min — ABNORMAL LOW (ref >60)
Glucose, Bld: 134 mg/dL — ABNORMAL HIGH (ref 70–99)
Potassium: 4.9 mmol/L (ref 3.5–5.1)
Sodium: 140 mmol/L (ref 135–145)

## 2019-10-05 LAB — GLUCOSE, CAPILLARY
Glucose-Capillary: 111 mg/dL — ABNORMAL HIGH (ref 70–99)
Glucose-Capillary: 112 mg/dL — ABNORMAL HIGH (ref 70–99)
Glucose-Capillary: 130 mg/dL — ABNORMAL HIGH (ref 70–99)
Glucose-Capillary: 136 mg/dL — ABNORMAL HIGH (ref 70–99)
Glucose-Capillary: 144 mg/dL — ABNORMAL HIGH (ref 70–99)
Glucose-Capillary: 144 mg/dL — ABNORMAL HIGH (ref 70–99)
Glucose-Capillary: 169 mg/dL — ABNORMAL HIGH (ref 70–99)

## 2019-10-05 MED ORDER — QUETIAPINE FUMARATE 50 MG PO TABS
50.0000 mg | ORAL_TABLET | Freq: Every day | ORAL | Status: DC
  Administered 2019-10-05 – 2020-05-01 (×204): 50 mg
  Filled 2019-10-05 (×111): qty 1
  Filled 2019-10-05: qty 2
  Filled 2019-10-05 (×95): qty 1

## 2019-10-05 MED ORDER — ENOXAPARIN SODIUM 40 MG/0.4ML ~~LOC~~ SOLN
40.0000 mg | Freq: Every day | SUBCUTANEOUS | Status: DC
  Administered 2019-10-05 – 2020-04-25 (×144): 40 mg via SUBCUTANEOUS
  Filled 2019-10-05 (×195): qty 0.4

## 2019-10-05 MED ORDER — FREE WATER
250.0000 mL | Status: DC
  Administered 2019-10-05 – 2019-11-22 (×288): 250 mL

## 2019-10-05 NOTE — Progress Notes (Signed)
Physical Therapy Treatment Patient Details Name: Joshua [name] MRN: [id number] DOB: May 27, 1981 Today's Date: 10/05/2019    History of Present Illness This pt is a 38 year old with history of alcohol abuse, seizures, noncompliant hypertension who presented with new seizure right-sided hemiplegia and confusion was found to have a large left intracranial hemorrhage now s/p left frontoparietal craniectomy for evacuation of left basal ganglia hematoma on 09/09/19. Skull flap in abdomen.  Intubated 9/12 (day of admission) and extubated 09/15/19.     PT Comments    Focus of today's session was mostly 4 extremity ROM and pt pushes too hard EOB to sustain with one person assist for very long.  Cervical rotation preformed and pt repositioned for pressure relief.  Pt continues to be unable to follow basic commands.  He did respond to discomfort with cervical rotation stretching by localizing to my hand with his left hand.  PT will continue to follow acutely for safe mobility progression.  Goals re-assessed today.  Follow Up Recommendations  SNF     Equipment Recommendations  Wheelchair (measurements PT);Wheelchair cushion (measurements PT);Hospital bed;3in1 (PT)    Recommendations for Other Services   NA     Precautions / Restrictions Precautions Precautions: Fall Precaution Comments: L crani, bone flap removed and in abdomen, NG, L mitt    Mobility  Bed Mobility Overal bed mobility: Needs Assistance Bed Mobility: Rolling;Supine to Sit;Sit to Supine Rolling: Total assist   Supine to sit: Total assist Sit to supine: Total assist   General bed mobility comments: Total assist to roll for repositioning at end of session, total assist to come up to sitting EOB, no initiation of trunk flexion or pulling up with L UE.  total assist to return to supine in the bed.       Modified Rankin (Stroke Patients Only) Modified Rankin (Stroke Patients Only) Pre-Morbid Rankin Score: No symptoms Modified  Rankin: Severe disability     Balance Overall balance assessment: Needs assistance Sitting-balance support: Feet supported;Single extremity supported Sitting balance-Leahy Scale: Zero Sitting balance - Comments: total assist EOB due to heavy posterior pushing today.  Attempted to disengage his left hand, but often unsuccessful, because even with hand on his knee he is pushing posteriorly with his trunk.  Unable to sustain EOB > 5 mins with only one person's support.  Postural control: Posterior lean(posterior push)                                  Cognition Arousal/Alertness: Lethargic Behavior During Therapy: Flat affect Overall Cognitive Status: Difficult to assess                                 General Comments: Pt continues to not follow any basic commands given increased time to process, he does respond to painful stimuli with localization swatting at me with his left hand as I preform cervical rotation ROM in the bed.       Exercises General Exercises - Upper Extremity Shoulder Flexion: PROM;Both;10 reps Elbow Flexion: PROM;Both;10 reps Elbow Extension: PROM;Both;10 reps Wrist Extension: PROM;Both;10 reps General Exercises - Lower Extremity Ankle Circles/Pumps: PROM;Both;10 reps Heel Slides: PROM;Both;10 reps Hip ABduction/ADduction: PROM;Both;10 reps Other Exercises Other Exercises: hip IR/ER bil x 10 reps    General Comments General comments (skin integrity, edema, etc.): Re-applied R UE splints at the end of session as they were not due  to be removed yet.       Pertinent Vitals/Pain Pain Assessment: Faces Faces Pain Scale: Hurts even more Pain Location: neck with motion Pain Descriptors / Indicators: Grimacing;Guarding Pain Intervention(s): Limited activity within patient's tolerance;Monitored during session;Repositioned           PT Goals (current goals can now be found in the care plan section) Acute Rehab PT Goals Patient  Stated Goal: none stated Progress towards PT goals: Progressing toward goals    Frequency    Min 3X/week      PT Plan Current plan remains appropriate       AM-PAC PT "6 Clicks" Mobility   Outcome Measure  Help needed turning from your back to your side while in a flat bed without using bedrails?: Total Help needed moving from lying on your back to sitting on the side of a flat bed without using bedrails?: Total Help needed moving to and from a bed to a chair (including a wheelchair)?: Total Help needed standing up from a chair using your arms (e.g., wheelchair or bedside chair)?: Total Help needed to walk in hospital room?: Total Help needed climbing 3-5 steps with a railing? : Total 6 Click Score: 6    End of Session   Activity Tolerance: Patient tolerated treatment well Patient left: in bed;with call bell/phone within reach;with bed alarm set;with family/visitor present Nurse Communication: Mobility status PT Visit Diagnosis: Muscle weakness (generalized) (M62.81);Difficulty in walking, not elsewhere classified (R26.2);Hemiplegia and hemiparesis Hemiplegia - Right/Left: Right Hemiplegia - dominant/non-dominant: Dominant Hemiplegia - caused by: Nontraumatic intracerebral hemorrhage     Time: [id number] PT Time Calculation (min) (ACUTE ONLY): 31 min  Charges:  $Therapeutic Exercise: 8-22 mins $Therapeutic Activity: 8-22 mins                    Tip Atienza B. Jered Heiny, PT, DPT  Acute Rehabilitation [phone] pager [phone] office  @ [facility]: [phone]   10/05/2019, 12:23 PM

## 2019-10-05 NOTE — Progress Notes (Signed)
PROGRESS NOTE    Joshua [name]  WIO:[id number] DOB: April 12, 1981 DOA: 09/09/2019 PCP: Patient, [name]      Brief Narrative:  Joshua [name] is a 38 y.o. M with no significant PMHx but apparently untreated severe HTN who presented with seizure-like activity, right-sided hemiparesis.  In the ER, Cr 2.29, K 2.4, CT head showed a large intraparenchymal hematoma and he was intubated.  Neurosurgery admitted the patient for emergent decompression.       Assessment & Plan:  Large LEFT intraparenchymal hemorrhage S/p decompressive craniectomy wth hematoma evacuation by Dr. Vertell Limber 9/12 Now densely aphasic, likely cognitively impaired, able to sit with 2-person assist, but movement limited by spontaneous left sided movement, inconsistent cue following.  Total assist for movement.  PEG dependent.  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  Remained 2.1-2.8 for three weeks.  Two days ago, Cr down to 1.3, this was transient.  Appears to have CKD. Elevated BUN, creatinine. -Increase free water -Trend BMP  Hypertension No prior treatment.  Last BP in our system 156/100 in 2013. Improved diastolic pressures after starting HCTZ yesterday.  K and Cr stable. -Continue amlodipine, labetalol, HCTZ -Daily BMP for 3 days  Seizure  Secondary to [place].  No recent seizure activity -Continue Keppra  Possible alcohol withdrawal Completed clonidine taper  -Continue folate  Acute metabolic acidosis Stable -Hold Bicarb        MDM and disposition: The below labs and imaging reports reviewed and summarized above.  Medication management as above.  The patient was admitted with a large intraparenchymal hemorrhage, and he is now completely disabled, dependent for all ADLs, and requires feeding by  PEG tube.  He is unable to make needs known and requires total care.  Attempt to discharge him safely are ongoing.       DVT prophylaxis: Lovenox Code Status: FULL Family Communication:     Consultants:   Neurosurgery  Procedures:   9/12 intubation  9/12 craniotomy  9/18 extubation  9/18 re-intubation  9/23 extubation  10/1 EGD PEG placement    Antimicrobials:   Vancomycin 9/14 >> 9/15   Zosyn --> Unasyn 9/14 >> 9/20  Cefazolin --> Cephalexin 9/28 >> 10/2    Subjective: No nursing concerns.  Patient unable to make needs known, or expressed discomfort.  No new fever, respiratory distress, diarrhea.     Objective: Vitals:   10/04/19 2330 10/05/19 0420 10/05/19 0836 10/05/19 0842  BP: 117/89 (!) 140/102 (!) 127/105 (!) 127/105  Pulse: (!) 109 (!) 101 (!) 111 (!) 106  Resp: '19 20  18  ' Temp: (!) 97.5 F (36.4 C) 97.8 F (36.6 C)  97.8 F (36.6 C)  TempSrc: Oral Oral  Oral  SpO2: 100% 100%  100%  Weight:      Height:        Intake/Output Summary (Last 24 hours) at 10/05/2019 1047 Last data filed at 10/05/2019 0545 Gross per 24 hour  Intake --  Output 3450 ml  Net -3450 ml   Filed Weights   09/28/19 0333 09/29/19 0109 10/04/19 0500  Weight: 61.3 kg 60 kg 57.7 kg    Examination: General appearance: Cachectic adult male, lying in bed, makes no movement or glimmer of awareness of me. HEENT: Anicteric, conjunctive are normal, lids and lashes normal.  No nasal deformity, discharge, epistaxis.  Lips dry. Skin: Warm  and dry, no suspicious rashes on face, neck, upper chest, arms, or legs.. Cardiac: Tachycardic, regular, no murmurs, JVP normal, no lower extremity edema. Respiratory: Respiratory effort appears normal, lungs clear without rales or wheezes, good air entry in anterior lung fields. Abdomen: Abdomen soft, no grimace to palpation, no ascites or distention. MSK: Right hand in brace, left hand in mtt, diffuse severe loss of subcutaneous muscle  mass and fat. Neuro: Eyes open but does not track me, does not follow any commands at all, no spontaneous verbalizations.  He has some spontaneous movement of the left leg when I come in, nothing on command. Psych: Unable to assess      Data Reviewed: I have personally reviewed following labs and imaging studies:  CBC: Recent Labs  Lab 10/01/19 0516  WBC 10.3  HGB 10.7*  HCT 33.5*  MCV 91.0  PLT 765   Basic Metabolic Panel: Recent Labs  Lab 09/29/19 0436 10/01/19 0516 10/02/19 0555 10/04/19 0959 10/05/19 0321  NA 143 141 136 141 140  K 4.6 5.2* 4.1 5.5* 4.9  CL 107 103 103 103 100  CO2 25 25 21* 26 26  GLUCOSE 160* 112* 111* 134* 134*  BUN 58* 65* 10 75* 79*  CREATININE 2.43* 2.44* 1.29* 2.05* 2.15*  CALCIUM 9.8 10.1 9.1 10.2 10.4*  MG  --  2.3  --   --   --   PHOS  --  6.3*  --   --   --    GFR: Estimated Creatinine Clearance: 37.6 mL/min (A) (by C-G formula based on SCr of 2.15 mg/dL (H)). Liver Function Tests: No results for input(s): AST, ALT, ALKPHOS, BILITOT, PROT, ALBUMIN in the last 168 hours. No results for input(s): LIPASE, AMYLASE in the last 168 hours. No results for input(s): AMMONIA in the last 168 hours. Coagulation Profile: No results for input(s): INR, PROTIME in the last 168 hours. Cardiac Enzymes: No results for input(s): CKTOTAL, CKMB, CKMBINDEX, TROPONINI in the last 168 hours. BNP (last 3 results) No results for input(s): PROBNP in the last 8760 hours. HbA1C: No results for input(s): HGBA1C in the last 72 hours. CBG: Recent Labs  Lab 10/04/19 1623 10/04/19 2014 10/05/19 0000 10/05/19 0419 10/05/19 0841  GLUCAP 132* 133* 169* 136* 144*   Lipid Profile: No results for input(s): CHOL, HDL, LDLCALC, TRIG, CHOLHDL, LDLDIRECT in the last 72 hours. Thyroid Function Tests: No results for input(s): TSH, T4TOTAL, FREET4, T3FREE, THYROIDAB in the last 72 hours. Anemia Panel: No results for input(s): VITAMINB12, FOLATE, FERRITIN, TIBC,  IRON, RETICCTPCT in the last 72 hours. Urine analysis:    Component Value Date/Time   COLORURINE YELLOW (A) 09/23/2019 1108   APPEARANCEUR CLEAR (A) 09/23/2019 1108   LABSPEC 1.015 09/23/2019 1108   PHURINE 7.5 09/23/2019 1108   GLUCOSEU NEGATIVE 09/23/2019 1108   HGBUR NEGATIVE 09/23/2019 1108   [place] 09/23/2019 1108   [place] 09/23/2019 1108   PROTEINUR NEGATIVE 09/23/2019 1108   UROBILINOGEN 1.0 04/05/2009 0021   NITRITE NEGATIVE 09/23/2019 1108   LEUKOCYTESUR NEGATIVE 09/23/2019 1108   Sepsis Labs: '@LABRCNTIP' (procalcitonin:4,lacticacidven:4)  )No results found for this or any previous visit (from the past 240 hour(s)).       Radiology Studies: No results found.      Scheduled Meds:  alteplase  2 mg Intracatheter Once   amLODipine  10 mg Per Tube Daily   chlorhexidine gluconate (MEDLINE KIT)  15 mL Mouth Rinse BID   enoxaparin (LOVENOX) injection  40 mg Subcutaneous Daily  folic acid  1 mg Per Tube Daily   free water  250 mL Per Tube Q4H   glycopyrrolate  0.1 mg Intravenous TID   hydrochlorothiazide  13 mg Per Tube Daily   insulin aspart  0-15 Units Subcutaneous Q4H   labetalol  300 mg Per Tube TID   levETIRAcetam  500 mg Per Tube BID   lipase/protease/amylase)  20,880 Units Per Tube Once   And   sodium bicarbonate  650 mg Per Tube Once   mouth rinse  15 mL Mouth Rinse 10 times per day   multivitamin  15 mL Per Tube Daily   pantoprazole sodium  40 mg Per Tube BID   QUEtiapine  50 mg Per Tube QHS   sodium chloride flush  10-40 mL Intracatheter Q12H   thiamine  100 mg Per Tube Daily   Continuous Infusions:  feeding supplement (OSMOLITE 1.5 CAL) 1,000 mL (10/03/19 0427)     LOS: 26 days    Time spent: 25 minutes     Edwin Dada, MD [facility] 10/05/2019, 10:47 AM     Please page through [place]:  [email or web address] Password [email or web address] If 7PM-7AM, please contact night-coverage

## 2019-10-05 NOTE — Progress Notes (Addendum)
Subjective: Patient reports (aphasic)  Objective: Vital signs in last 24 hours: Temp:  [97.5 F (36.4 C)-98.8 F (37.1 C)] 97.8 F (36.6 C) (10/08 0420) Pulse Rate:  [101-127] 101 (10/08 0420) Resp:  [19-22] 20 (10/08 0420) BP: (98-140)/(87-124) 140/102 (10/08 0420) SpO2:  [99 %-100 %] 100 % (10/08 0420)  Intake/Output from previous day: 10/07 0701 - 10/08 0700 In: -  Out: 3450 [Urine:3450] Intake/Output this shift: No intake/output data recorded.  Without change. Awake, eyes open, but does not follow. Does not follow commands. Moving left side only.  Lab Results: No results for input(s): WBC, HGB, HCT, PLT in the last 72 hours. BMET Recent Labs    10/04/19 0959 10/05/19 0321  NA 141 140  K 5.5* 4.9  CL 103 100  CO2 26 26  GLUCOSE 134* 134*  BUN 75* 79*  CREATININE 2.05* 2.15*  CALCIUM 10.2 10.4*    Studies/Results: No results found.  Assessment/Plan: stable  LOS: 26 days  Supportive care continues   [name] 10/05/2019, 7:48 AM  No change.  Call with questions.

## 2019-10-06 LAB — GLUCOSE, CAPILLARY
Glucose-Capillary: 124 mg/dL — ABNORMAL HIGH (ref 70–99)
Glucose-Capillary: 156 mg/dL — ABNORMAL HIGH (ref 70–99)
Glucose-Capillary: 121 mg/dL — ABNORMAL HIGH (ref 70–99)
Glucose-Capillary: 150 mg/dL — ABNORMAL HIGH (ref 70–99)
Glucose-Capillary: 131 mg/dL — ABNORMAL HIGH (ref 70–99)
Glucose-Capillary: 97 mg/dL (ref 70–99)

## 2019-10-06 LAB — BASIC METABOLIC PANEL
Anion gap: 15 (ref 5–15)
BUN: 79 mg/dL — ABNORMAL HIGH (ref 6–20)
CO2: 27 mmol/L (ref 22–32)
Calcium: 10.4 mg/dL — ABNORMAL HIGH (ref 8.9–10.3)
Chloride: 97 mmol/L — ABNORMAL LOW (ref 98–111)
Creatinine, Ser: 2.44 mg/dL — ABNORMAL HIGH (ref 0.61–1.24)
GFR calc Af Amer: 37 mL/min — ABNORMAL LOW (ref >60)
GFR calc non Af Amer: 32 mL/min — ABNORMAL LOW (ref >60)
Glucose, Bld: 147 mg/dL — ABNORMAL HIGH (ref 70–99)
Potassium: 5.8 mmol/L — ABNORMAL HIGH (ref 3.5–5.1)
Sodium: 139 mmol/L (ref 135–145)

## 2019-10-06 MED ORDER — GLYCOPYRROLATE 0.2 MG/ML IJ SOLN
0.1000 mg | Freq: Three times a day (TID) | INTRAMUSCULAR | Status: DC | PRN
  Administered 2019-10-10: 0.1 mg via INTRAVENOUS
  Filled 2019-10-06: qty 1

## 2019-10-06 NOTE — Progress Notes (Signed)
PROGRESS NOTE    Joshua [name]  UYQ:[id number] DOB: [birth year] DOA: 09/09/2019 PCP: Patient, [name]      Brief Narrative:  Mr. Edmonston is a 38 y.o. M with no significant PMHx but apparently untreated severe HTN who presented with seizure-like activity, right-sided hemiparesis.  In the ER, Cr 2.29, K 2.4, CT head showed a large intraparenchymal hematoma and he was intubated.  Neurosurgery admitted the patient for emergent decompression.       Assessment & Plan:  Large LEFT intraparenchymal hemorrhage S/p decompressive craniectomy wth hematoma evacuation by Dr. Vertell Limber 9/12 Now densely aphasic, likely cognitively impaired, able to sit with 2-person assist, but movement limited by spontaneous left sided movement, inconsistent cue following.  Total assist for movement.  PEG dependent.  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  Remained 2.1-2.8 for three weeks.  Cr 2.4 today, K up.  BUN elevated. -Continue free water -Trend BMP  Hyperkalemia Mild.   -Monitor  Hypertension No prior treatment.  Last BP in our system 156/100 in 2013. BP improved -Continue amlodipine, labetalol, HCTZ -Daily BMP for 3 days  Seizure  Secondary to [place].  No recent seizure activity -Continue Keppra  Possible alcohol withdrawal Completed clonidine taper  -Continue folate  Acute metabolic acidosis Resovled  Sinus tachycardia Stable       MDM and disposition: The below labs and imaging reports reviewed and summarized above.  Medication management as above.  The patient was admitted with a large intraparenchymal hemorrhage, now completely disabled.  He is unable to make notes known and requires total care.  He is dependent for all ADLs, requires feeding by PEG tube.  Attempts to  discharge him safely are ongoing.       DVT prophylaxis: Lovenox Code Status: FULL Family Communication:     Consultants:   Neurosurgery  Procedures:   9/12 intubation  9/12 craniotomy  9/18 extubation  9/18 re-intubation  9/23 extubation  10/1 EGD PEG placement    Antimicrobials:   Vancomycin 9/14 >> 9/15   Zosyn --> Unasyn 9/14 >> 9/20  Cefazolin --> Cephalexin 9/28 >> 10/2    Subjective: No nursing concerns.  Patient unable to consistently follow commands per nursing.  No fever, respiratory distress, diarrhea.        Objective: Vitals:   10/06/19 0432 10/06/19 0816 10/06/19 1224 10/06/19 1653  BP:  (!) 131/98 (!) 127/93 (!) 137/99  Pulse:  (!) 105 (!) 107 (!) 115  Resp:  '17 19 18  ' Temp:  (!) 97.4 F (36.3 C) 97.7 F (36.5 C) 98.5 F (36.9 C)  TempSrc:  Oral Oral Oral  SpO2:  100% 100% 100%  Weight: 54.6 kg     Height:        Intake/Output Summary (Last 24 hours) at 10/06/2019 1756 Last data filed at 10/06/2019 1500 Gross per 24 hour  Intake 25711.5 ml  Output 1100 ml  Net 24611.5 ml   Filed Weights   09/29/19 0109 10/04/19 0500 10/06/19 0432  Weight: 60 kg 57.7 kg 54.6 kg    Examination: General appearance: Cachectic adult male, lying in bed, no legs no movement spontaneously. HEENT: Anicteric, conjunctive are normal, lids and lashes normal.  No nasal deformity, discharge, epistaxis.  Lips dry. Skin: Warm and dry, no suspicious rashes on face, neck, upper chest, arms, or legs.. Cardiac: Tachycardic, regular,  JVP normal, no lower extremity edema Respiratory: Respiratory effort normal, lungs clear without rales or wheezes. Abdomen: Abdomen soft, no grimace to palpation, subcutaneous device in the right lower quadrant. MSK: Severe diffuse loss of subcutaneous muscle mass and fat. Neuro: Eyes open, pupils reactive but does not track me.  No spontaneous verbalizations or spontaneous movements.  Does not follow commands. Psych: Unable to  assess      Data Reviewed: I have personally reviewed following labs and imaging studies:  CBC: Recent Labs  Lab 10/01/19 0516  WBC 10.3  HGB 10.7*  HCT 33.5*  MCV 91.0  PLT 416   Basic Metabolic Panel: Recent Labs  Lab 10/01/19 0516 10/02/19 0555 10/04/19 0959 10/05/19 0321 10/06/19 0509  NA 141 136 141 140 139  K 5.2* 4.1 5.5* 4.9 5.8*  CL 103 103 103 100 97*  CO2 25 21* '26 26 27  ' GLUCOSE 112* 111* 134* 134* 147*  BUN 65* 10 75* 79* 79*  CREATININE 2.44* 1.29* 2.05* 2.15* 2.44*  CALCIUM 10.1 9.1 10.2 10.4* 10.4*  MG 2.3  --   --   --   --   PHOS 6.3*  --   --   --   --    GFR: Estimated Creatinine Clearance: 31.4 mL/min (A) (by C-G formula based on SCr of 2.44 mg/dL (H)). Liver Function Tests: No results for input(s): AST, ALT, ALKPHOS, BILITOT, PROT, ALBUMIN in the last 168 hours. No results for input(s): LIPASE, AMYLASE in the last 168 hours. No results for input(s): AMMONIA in the last 168 hours. Coagulation Profile: No results for input(s): INR, PROTIME in the last 168 hours. Cardiac Enzymes: No results for input(s): CKTOTAL, CKMB, CKMBINDEX, TROPONINI in the last 168 hours. BNP (last 3 results) No results for input(s): PROBNP in the last 8760 hours. HbA1C: No results for input(s): HGBA1C in the last 72 hours. CBG: Recent Labs  Lab 10/05/19 2351 10/06/19 0314 10/06/19 0817 10/06/19 1226 10/06/19 1650  GLUCAP 111* 124* 156* 121* 150*   Lipid Profile: No results for input(s): CHOL, HDL, LDLCALC, TRIG, CHOLHDL, LDLDIRECT in the last 72 hours. Thyroid Function Tests: No results for input(s): TSH, T4TOTAL, FREET4, T3FREE, THYROIDAB in the last 72 hours. Anemia Panel: No results for input(s): VITAMINB12, FOLATE, FERRITIN, TIBC, IRON, RETICCTPCT in the last 72 hours. Urine analysis:    Component Value Date/Time   COLORURINE YELLOW (A) 09/23/2019 1108   APPEARANCEUR CLEAR (A) 09/23/2019 1108   LABSPEC 1.015 09/23/2019 1108   PHURINE 7.5  09/23/2019 1108   GLUCOSEU NEGATIVE 09/23/2019 1108   HGBUR NEGATIVE 09/23/2019 1108   [place] 09/23/2019 1108   [place] 09/23/2019 1108   PROTEINUR NEGATIVE 09/23/2019 1108   UROBILINOGEN 1.0 04/05/2009 0021   NITRITE NEGATIVE 09/23/2019 1108   LEUKOCYTESUR NEGATIVE 09/23/2019 1108   Sepsis Labs: '@LABRCNTIP' (procalcitonin:4,lacticacidven:4)  )No results found for this or any previous visit (from the past 240 hour(s)).       Radiology Studies: No results found.      Scheduled Meds:  alteplase  2 mg Intracatheter Once   amLODipine  10 mg Per Tube Daily   chlorhexidine gluconate (MEDLINE KIT)  15 mL Mouth Rinse BID   enoxaparin (LOVENOX) injection  40 mg Subcutaneous Daily   folic acid  1 mg Per Tube Daily   free water  250 mL Per Tube Q4H   hydrochlorothiazide  13 mg Per Tube Daily   insulin aspart  0-15 Units Subcutaneous Q4H   labetalol  300 mg  Per Tube TID   levETIRAcetam  500 mg Per Tube BID   lipase/protease/amylase)  20,880 Units Per Tube Once   And   sodium bicarbonate  650 mg Per Tube Once   mouth rinse  15 mL Mouth Rinse 10 times per day   multivitamin  15 mL Per Tube Daily   pantoprazole sodium  40 mg Per Tube BID   QUEtiapine  50 mg Per Tube QHS   sodium chloride flush  10-40 mL Intracatheter Q12H   thiamine  100 mg Per Tube Daily   Continuous Infusions:  feeding supplement (OSMOLITE 1.5 CAL) 1,000 mL (10/06/19 0430)     LOS: 27 days    Time spent: 60mnutes     CEdwin Dada MD [facility] 10/06/2019, 5:56 PM     Please page through [place]  [email or web address] Password [email or web address] If 7PM-7AM, please contact night-coverage

## 2019-10-07 DIAGNOSIS — I10 Essential (primary) hypertension: Secondary | ICD-10-CM | POA: Diagnosis present

## 2019-10-07 DIAGNOSIS — N184 Chronic kidney disease, stage 4 (severe): Secondary | ICD-10-CM | POA: Diagnosis present

## 2019-10-07 DIAGNOSIS — R569 Unspecified convulsions: Secondary | ICD-10-CM

## 2019-10-07 DIAGNOSIS — E43 Unspecified severe protein-calorie malnutrition: Secondary | ICD-10-CM | POA: Diagnosis not present

## 2019-10-07 LAB — BASIC METABOLIC PANEL
Anion gap: 14 (ref 5–15)
BUN: 70 mg/dL — ABNORMAL HIGH (ref 6–20)
CO2: 28 mmol/L (ref 22–32)
Calcium: 10.1 mg/dL (ref 8.9–10.3)
Chloride: 95 mmol/L — ABNORMAL LOW (ref 98–111)
Creatinine, Ser: 2.18 mg/dL — ABNORMAL HIGH (ref 0.61–1.24)
GFR calc Af Amer: 43 mL/min — ABNORMAL LOW (ref >60)
GFR calc non Af Amer: 37 mL/min — ABNORMAL LOW (ref >60)
Glucose, Bld: 134 mg/dL — ABNORMAL HIGH (ref 70–99)
Potassium: 4.4 mmol/L (ref 3.5–5.1)
Sodium: 137 mmol/L (ref 135–145)

## 2019-10-07 LAB — GLUCOSE, CAPILLARY
Glucose-Capillary: 103 mg/dL — ABNORMAL HIGH (ref 70–99)
Glucose-Capillary: 109 mg/dL — ABNORMAL HIGH (ref 70–99)
Glucose-Capillary: 120 mg/dL — ABNORMAL HIGH (ref 70–99)
Glucose-Capillary: 120 mg/dL — ABNORMAL HIGH (ref 70–99)
Glucose-Capillary: 138 mg/dL — ABNORMAL HIGH (ref 70–99)
Glucose-Capillary: 138 mg/dL — ABNORMAL HIGH (ref 70–99)

## 2019-10-07 NOTE — Progress Notes (Addendum)
PROGRESS NOTE    Joshua [name]  WOE:[id number] DOB: [birth year] DOA: 09/09/2019 PCP: Patient, [name]      Brief Narrative:  Mr. Hubbert is a 38 y.o. M with no significant PMHx but apparently untreated severe HTN who presented with seizure-like activity, right-sided hemiparesis.  In the ER, Cr 2.29, K 2.4, CT head showed a large intraparenchymal hematoma and he was intubated.  Neurosurgery admitted the patient for emergent decompression.       Assessment & Plan:  Large LEFT intraparenchymal hemorrhage S/p decompressive craniectomy wth hematoma evacuation by Dr. Vertell Limber 9/12 Now densely aphasic, likely cognitively impaired, able to sit with 2-person assist, but movement limited by spontaneous left sided movement, inconsistent cue following.  Total assist for movement.  PEG dependent.  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  Remained 2.1-2.8 for three weeks.  Cr stable, K normal.  BUN elevated. -Continue free water -Trend BMP  Hyperkalemia Mild.   -Monitor  Hypertension No prior treatment.  Last BP in our system 156/100 in 2013. BP normal -Continue amlodipine, labetalol, HCTZ -Repeat BMP in 1 week on new HCTZ  Seizure  Secondary to [place].  No recent seizure activity -Continue Keppra  Possible alcohol withdrawal Completed clonidine taper  -Continue folate  Acute metabolic acidosis Resovled  Sinus tachycardia Stable  Severe protein calorie malnutrition       MDM and disposition: The below labs and imaging reports reviewed and summarized above.  Medication management as above.    The patient was admitted with a large intraparenchymal hemorrhage, now completely disabled.  He is unable to make notes known and requires total care.  He is dependent for all ADLs,  requires feeding by PEG tube.  Attempts to find a safe discharge disposition are ongoing.       DVT prophylaxis: Lovenox Code Status: FULL Family Communication:     Consultants:   Neurosurgery  Procedures:   9/12 intubation  9/12 craniotomy  9/18 extubation  9/18 re-intubation  9/23 extubation  10/1 EGD PEG placement    Antimicrobials:   Vancomycin 9/14 >> 9/15   Zosyn --> Unasyn 9/14 >> 9/20  Cefazolin --> Cephalexin 9/28 >> 10/2    Subjective: No nursing concerns.  No fever, respiratory distress, diarrhea.        Objective: Vitals:   10/07/19 0408 10/07/19 0441 10/07/19 0700 10/07/19 0800  BP: 116/88  (!) 132/104 (!) 132/104  Pulse: 96  (!) 106 (!) 102  Resp: '15  18 18  ' Temp: 97.6 F (36.4 C)  98 F (36.7 C) 98 F (36.7 C)  TempSrc: Oral  Axillary Oral  SpO2: 100%  100% 100%  Weight:  56.3 kg    Height:        Intake/Output Summary (Last 24 hours) at 10/07/2019 0935 Last data filed at 10/07/2019 0300 Gross per 24 hour  Intake 692.5 ml  Output 1950 ml  Net -1257.5 ml   Filed Weights   10/04/19 0500 10/06/19 0432 10/07/19 0441  Weight: 57.7 kg 54.6 kg 56.3 kg    Examination: General appearance: Adult male, lying in bed, no acute distress.    HEENT: Large left-sided craniotomy scar appears clean dry and intact.  Lips dry. Skin: No suspicious rashes or lesions on face, chest, arms, or legs Cardiac: Tachycardic, regular, JVP normal, no lower extremity edema Respiratory: Respiratory effort  normal, lungs clear without rales or wheezes Abdomen: Grimace to palpation, no distention or ascites. MSK: Severe diffuse loss of subcutaneous muscle mass and fat. Neuro: Moving the left arm spontaneously.  Does not follow commands.  Spontaneous verbalizations.  Does not make eye contact.  Not appear aware of my presence. Psych:        Data Reviewed: I have personally reviewed following labs and imaging studies:  CBC: Recent Labs  Lab  25-Oct-2019 0516  WBC 10.3  HGB 10.7*  HCT 33.5*  MCV 91.0  PLT 283   Basic Metabolic Panel: Recent Labs  Lab October 25, 2019 0516 10/02/19 0555 10/04/19 0959 10/05/19 0321 10/06/19 0509 10/07/19 0312  NA 141 136 141 140 139 137  K 5.2* 4.1 5.5* 4.9 5.8* 4.4  CL 103 103 103 100 97* 95*  CO2 25 21* '26 26 27 28  ' GLUCOSE 112* 111* 134* 134* 147* 134*  BUN 65* 10 75* 79* 79* 70*  CREATININE 2.44* 1.29* 2.05* 2.15* 2.44* 2.18*  CALCIUM 10.1 9.1 10.2 10.4* 10.4* 10.1  MG 2.3  --   --   --   --   --   PHOS 6.3*  --   --   --   --   --    GFR: Estimated Creatinine Clearance: 36.2 mL/min (A) (by C-G formula based on SCr of 2.18 mg/dL (H)). Liver Function Tests: No results for input(s): AST, ALT, ALKPHOS, BILITOT, PROT, ALBUMIN in the last 168 hours. No results for input(s): LIPASE, AMYLASE in the last 168 hours. No results for input(s): AMMONIA in the last 168 hours. Coagulation Profile: No results for input(s): INR, PROTIME in the last 168 hours. Cardiac Enzymes: No results for input(s): CKTOTAL, CKMB, CKMBINDEX, TROPONINI in the last 168 hours. BNP (last 3 results) No results for input(s): PROBNP in the last 8760 hours. HbA1C: No results for input(s): HGBA1C in the last 72 hours. CBG: Recent Labs  Lab 10/06/19 1650 10/06/19 1952 10/06/19 2322 10/07/19 0411 10/07/19 0747  GLUCAP 150* 131* 97 120* 120*   Lipid Profile: No results for input(s): CHOL, HDL, LDLCALC, TRIG, CHOLHDL, LDLDIRECT in the last 72 hours. Thyroid Function Tests: No results for input(s): TSH, T4TOTAL, FREET4, T3FREE, THYROIDAB in the last 72 hours. Anemia Panel: No results for input(s): VITAMINB12, FOLATE, FERRITIN, TIBC, IRON, RETICCTPCT in the last 72 hours. Urine analysis:    Component Value Date/Time   COLORURINE YELLOW (A) 09/23/2019 1108   APPEARANCEUR CLEAR (A) 09/23/2019 1108   LABSPEC 1.015 09/23/2019 1108   PHURINE 7.5 09/23/2019 1108   GLUCOSEU NEGATIVE 09/23/2019 1108   HGBUR NEGATIVE  09/23/2019 1108   [place] 09/23/2019 1108   [place] 09/23/2019 1108   PROTEINUR NEGATIVE 09/23/2019 1108   UROBILINOGEN 1.0 04/05/2009 0021   NITRITE NEGATIVE 09/23/2019 1108   LEUKOCYTESUR NEGATIVE 09/23/2019 1108   Sepsis Labs: '@LABRCNTIP' (procalcitonin:4,lacticacidven:4)  )No results found for this or any previous visit (from the past 240 hour(s)).       Radiology Studies: No results found.      Scheduled Meds:  alteplase  2 mg Intracatheter Once   amLODipine  10 mg Per Tube Daily   chlorhexidine gluconate (MEDLINE KIT)  15 mL Mouth Rinse BID   enoxaparin (LOVENOX) injection  40 mg Subcutaneous Daily   folic acid  1 mg Per Tube Daily   free water  250 mL Per Tube Q4H   hydrochlorothiazide  13 mg Per Tube Daily   insulin aspart  0-15 Units Subcutaneous Q4H  labetalol  300 mg Per Tube TID   levETIRAcetam  500 mg Per Tube BID   lipase/protease/amylase)  20,880 Units Per Tube Once   And   sodium bicarbonate  650 mg Per Tube Once   mouth rinse  15 mL Mouth Rinse 10 times per day   multivitamin  15 mL Per Tube Daily   pantoprazole sodium  40 mg Per Tube BID   QUEtiapine  50 mg Per Tube QHS   sodium chloride flush  10-40 mL Intracatheter Q12H   thiamine  100 mg Per Tube Daily   Continuous Infusions:  feeding supplement (OSMOLITE 1.5 CAL) 1,000 mL (10/06/19 0430)     LOS: 28 days    Time spent: 25 minutes     Edwin Dada, MD [facility] 10/07/2019, 9:35 AM     Please page through [place]:  [email or web address] Password [email or web address] If 7PM-7AM, please contact night-coverage

## 2019-10-07 NOTE — Progress Notes (Signed)
Pt family at bedside called RN to report she witness pt having some tremors/shakes to the right side. RN assessed and witness pt having the tremor which comes on for few seconds and stop. MD on unit notified and Dr. Loleta Books at bedside to assess pt. Will continue to monitor pt. Delia Heady RN

## 2019-10-08 LAB — GLUCOSE, CAPILLARY
Glucose-Capillary: 103 mg/dL — ABNORMAL HIGH (ref 70–99)
Glucose-Capillary: 104 mg/dL — ABNORMAL HIGH (ref 70–99)
Glucose-Capillary: 109 mg/dL — ABNORMAL HIGH (ref 70–99)
Glucose-Capillary: 117 mg/dL — ABNORMAL HIGH (ref 70–99)
Glucose-Capillary: 128 mg/dL — ABNORMAL HIGH (ref 70–99)
Glucose-Capillary: 147 mg/dL — ABNORMAL HIGH (ref 70–99)

## 2019-10-08 NOTE — Progress Notes (Signed)
PROGRESS NOTE    Joshua [name]  MRN:[id number] DOB: 11/23/1981 DOA: 09/09/2019 PCP: Patient, [name]      Brief Narrative:  Joshua [name] is a 37 y.o. M with no significant PMHx but apparently untreated severe HTN who presented with seizure-like activity, right-sided hemiparesis.  In the ER, Cr 2.29, K 2.4, CT head showed a large intraparenchymal hematoma and he was intubated.  Neurosurgery admitted the patient for emergent decompression.        Assessment & Plan:  Large LEFT intraparenchymal hemorrhage S/p decompressive craniectomy wth hematoma evacuation by Dr. Stern 9/12 Now densely aphasic, likely cognitively impaired, able to sit with 2-person assist, but movement limited by spontaneous left sided movement, inconsistent cue following.  Total assist for movement.  PEG dependent.  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  Remained 2.1-2.8 for three weeks.  Cr stable, K normal.  BUN elevated. -Continue free water -Trend BMP  Hyperkalemia Mild.   -Monitor  Hypertension No prior treatment.  Last BP in our system 156/100 in 2013. BP normal -Continue amlodipine, labetalol, HCTZ -Repeat BMP in 1 week on new HCTZ  Seizure  Secondary to [place].  No recent seizure activity -Continue Keppra  Possible alcohol withdrawal Completed clonidine taper  -Continue folate  Acute metabolic acidosis Resovled  Sinus tachycardia Stable  Severe protein calorie malnutrition       MDM and disposition: The below labs imaging reports reviewed and summarized above.  Medication management as above.    The patient was admitted with a large intraparenchymal hemorrhage, now completely disabled.  He is unable to make notes known and requires total care.  He is dependent for all ADLs,  requires feeding by PEG tube.  Attempts to find a safe discharge disposition are ongoing.       DVT prophylaxis: Lovenox Code Status: FULL Family Communication: Mother by phone, daughter at the bedside    Consultants:   Neurosurgery  Procedures:   9/12 intubation  9/12 craniotomy  9/18 extubation  9/18 re-intubation  9/23 extubation  10/1 EGD PEG placement    Antimicrobials:   Vancomycin 9/14 >> 9/15   Zosyn --> Unasyn 9/14 >> 9/20  Cefazolin --> Cephalexin 9/28 >> 10/2    Subjective: No nursing concerns.  Restless.  No fever, respiratory distress, diarrhea.           Objective: Vitals:   10/08/19 0330 10/08/19 0500 10/08/19 0805 10/08/19 1134  BP: 110/86  108/87 (!) 156/95  Pulse: 100  91 91  Resp: 17  16 18  Temp: 98.2 F (36.8 C)  98.2 F (36.8 C) 97.9 F (36.6 C)  TempSrc: Oral  Oral Axillary  SpO2: 100%  97% 99%  Weight:  55.7 kg    Height:        Intake/Output Summary (Last 24 hours) at 10/08/2019 1519 Last data filed at 10/08/2019 0439 Gross per 24 hour  Intake 4298 ml  Output 300 ml  Net 3998 ml   Filed Weights   10/06/19 0432 10/07/19 0441 10/08/19 0500  Weight: 54.6 kg 56.3 kg 55.7 kg    Examination: General appearance: Thin adult male, lying in bed, no acute distress. HEENT: Large left-sided craniotomy scar.  Lips dry. Skin:   Cardiac: Tachycardic, regular, no lower extremity edema Respiratory: Respiratory rate normal, lungs clear without rales or wheezes Abdomen: No grimace   to palpation, distention, or ascites. MSK: Severe diffuse loss of subcutaneous muscle mass and fat. Neuro: Legs restless, kicking legs continuously.  Does not follow commands.  Does not make eye contact.  Head turning to the right. Psych:        Data Reviewed: I have personally reviewed following labs and imaging studies:  CBC: No results for input(s): WBC, NEUTROABS, HGB, HCT, MCV, PLT in the last 168 hours. Basic Metabolic Panel: Recent  Labs  Lab 10/02/19 0555 10/04/19 0959 10/05/19 0321 10/06/19 0509 10/07/19 0312  NA 136 141 140 139 137  K 4.1 5.5* 4.9 5.8* 4.4  CL 103 103 100 97* 95*  CO2 21* 26 26 27 28  GLUCOSE 111* 134* 134* 147* 134*  BUN 10 75* 79* 79* 70*  CREATININE 1.29* 2.05* 2.15* 2.44* 2.18*  CALCIUM 9.1 10.2 10.4* 10.4* 10.1   GFR: Estimated Creatinine Clearance: 35.8 mL/min (A) (by C-G formula based on SCr of 2.18 mg/dL (H)). Liver Function Tests: No results for input(s): AST, ALT, ALKPHOS, BILITOT, PROT, ALBUMIN in the last 168 hours. No results for input(s): LIPASE, AMYLASE in the last 168 hours. No results for input(s): AMMONIA in the last 168 hours. Coagulation Profile: No results for input(s): INR, PROTIME in the last 168 hours. Cardiac Enzymes: No results for input(s): CKTOTAL, CKMB, CKMBINDEX, TROPONINI in the last 168 hours. BNP (last 3 results) No results for input(s): PROBNP in the last 8760 hours. HbA1C: No results for input(s): HGBA1C in the last 72 hours. CBG: Recent Labs  Lab 10/07/19 2039 10/07/19 2352 10/08/19 0359 10/08/19 0854 10/08/19 1131  GLUCAP 109* 138* 117* 128* 103*   Lipid Profile: No results for input(s): CHOL, HDL, LDLCALC, TRIG, CHOLHDL, LDLDIRECT in the last 72 hours. Thyroid Function Tests: No results for input(s): TSH, T4TOTAL, FREET4, T3FREE, THYROIDAB in the last 72 hours. Anemia Panel: No results for input(s): VITAMINB12, FOLATE, FERRITIN, TIBC, IRON, RETICCTPCT in the last 72 hours. Urine analysis:    Component Value Date/Time   COLORURINE YELLOW (A) 09/23/2019 1108   APPEARANCEUR CLEAR (A) 09/23/2019 1108   LABSPEC 1.015 09/23/2019 1108   PHURINE 7.5 09/23/2019 1108   GLUCOSEU NEGATIVE 09/23/2019 1108   HGBUR NEGATIVE 09/23/2019 1108   BILIRUBINUR NEGATIVE 09/23/2019 1108   KETONESUR NEGATIVE 09/23/2019 1108   PROTEINUR NEGATIVE 09/23/2019 1108   UROBILINOGEN 1.0 04/05/2009 0021   NITRITE NEGATIVE 09/23/2019 1108   LEUKOCYTESUR NEGATIVE  09/23/2019 1108   Sepsis Labs: @LABRCNTIP(procalcitonin:4,lacticacidven:4)  )No results found for this or any previous visit (from the past 240 hour(s)).       Radiology Studies: No results found.      Scheduled Meds: . alteplase  2 mg Intracatheter Once  . amLODipine  10 mg Per Tube Daily  . chlorhexidine gluconate (MEDLINE KIT)  15 mL Mouth Rinse BID  . enoxaparin (LOVENOX) injection  40 mg Subcutaneous Daily  . folic acid  1 mg Per Tube Daily  . free water  250 mL Per Tube Q4H  . hydrochlorothiazide  13 mg Per Tube Daily  . insulin aspart  0-15 Units Subcutaneous Q4H  . labetalol  300 mg Per Tube TID  . levETIRAcetam  500 mg Per Tube BID  . lipase/protease/amylase)  20,880 Units Per Tube Once   And  . sodium bicarbonate  650 mg Per Tube Once  . mouth rinse  15 mL Mouth Rinse 10 times per day  . multivitamin  15 mL Per Tube Daily  . pantoprazole sodium  40 mg   Per Tube BID  . QUEtiapine  50 mg Per Tube QHS  . sodium chloride flush  10-40 mL Intracatheter Q12H  . thiamine  100 mg Per Tube Daily   Continuous Infusions: . feeding supplement (OSMOLITE 1.5 CAL) 1,000 mL (10/08/19 1457)     LOS: 29 days    Time spent: 25 minutes     P , MD [facility] 10/08/2019, 3:19 PM     Please page through [facility]:  [email or web address] Password [email or web address] If 7PM-7AM, please contact night-coverage       

## 2019-10-08 NOTE — TOC Progression Note (Signed)
Transition of Care [facility]) - Progression Note    Patient Details  Name: Joshua [name] MRN: [id number] Date of Birth: 11-Jan-1981  Transition of Care [facility]) CM/SW [place], [place] Phone Number: 10/08/2019, 8:17 AM  Clinical Narrative:     Patient still does not have any active bed offers. CSW will continue to follow and assist with finding a SNF placement.   Expected Discharge Plan: [place] Barriers to Discharge: Continued Medical Work up, SNF Pending payor source - LOG  Expected Discharge Plan and Services Expected Discharge Plan: [place] In-house Referral: Clinical Social Work Discharge Planning Services: NA Post Acute Care Choice: [place] Living arrangements for the past 2 months: Apartment                 DME Arranged: N/A DME Agency: NA       HH Arranged: NA HH Agency: NA         Social Determinants of Health (SDOH) Interventions    Readmission Risk Interventions No flowsheet data found.

## 2019-10-09 LAB — GLUCOSE, CAPILLARY
Glucose-Capillary: 123 mg/dL — ABNORMAL HIGH (ref 70–99)
Glucose-Capillary: 113 mg/dL — ABNORMAL HIGH (ref 70–99)
Glucose-Capillary: 151 mg/dL — ABNORMAL HIGH (ref 70–99)

## 2019-10-09 NOTE — Progress Notes (Signed)
PROGRESS NOTE    Joshua [name]  [id number]:[id number] DOB: 1981/02/09 DOA: 09/09/2019 PCP: Patient, [name]      Brief Narrative:  Joshua [name] is a 38 y.o. M with no significant PMHx but apparently untreated severe HTN who presented with seizure-like activity, right-sided hemiparesis.  In the ER, Cr 2.29, K 2.4, CT head showed a large intraparenchymal hematoma and he was intubated.  Neurosurgery admitted the patient for emergent decompression.        Assessment & Plan:  Large LEFT intraparenchymal hemorrhage S/p decompressive craniectomy wth hematoma evacuation by Dr. Vertell Limber 9/12 Now densely aphasic, likely cognitively impaired, able to sit with 2-person assist, but movement limited by spontaneous left sided movement, inconsistent cue following.  Total assist for movement.  PEG dependent.  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  Remained 2.1-2.8 for three weeks.  Cr stable, K normal.  BUN elevated.  Hyperkalemia Mild.  Transient, resolved without treatment.  Hypertension No prior treatment.  Last BP in our system 156/100 in 2013. BP improved with HCTZ, diastolics high yesterday -Continue amlodipine, labetalol, HCTZ -Repeat BMP in 1 week on new HCTZ  Seizure  Secondary to [place].  No recent seizure activity -Continue Keppra  Possible alcohol withdrawal Completed clonidine taper  -Continue folate  Acute metabolic acidosis Resovled  Sinus tachycardia Stable  Severe protein calorie malnutrition       MDM and disposition: The below labs and imaging reports reviewed and summarized above.  Medication management as above.     The patient was admitted with a large intraparenchymal hemorrhage, now completely disabled.  He is unable to make notes known and requires total care.  He is  dependent for all ADLs, requires feeding by PEG tube.  Attempts to find a safe discharge disposition are ongoing.       DVT prophylaxis: Lovenox Code Status: FULL Family Communication: Mother at bedside    Consultants:   Neurosurgery  Procedures:   9/12 intubation  9/12 craniotomy  9/18 extubation  9/18 re-intubation  9/23 extubation  10/1 EGD PEG placement    Antimicrobials:   Vancomycin 9/14 >> 9/15   Zosyn --> Unasyn 9/14 >> 9/20  Cefazolin --> Cephalexin 9/28 >> 10/2    Subjective: No new nursing complaints.  No fever, respiratory distress, diarrhea.  He is not follow commands, he does not respond to verbal or tactile stimuli.        Objective: Vitals:   10/09/19 0453 10/09/19 0835 10/09/19 1234 10/09/19 1725  BP:  (!) 137/109 (!) 127/98 (!) 130/95  Pulse:  91 98 90  Resp:  _0 Temp:  (!) 97.5 F (36.4 C) (!) 97.5 F (36.4 C) 97.8 F (36.6 C)  TempSrc:  Oral Oral Oral  SpO2:  100% 97% 100%  Weight: 54.8 kg     Height:        Intake/Output Summary (Last 24 hours) at 10/09/2019 1823 Last data filed at 10/08/2019 2154 Gross per 24 hour  Intake 10 ml  Output 650 ml  Net -640 ml   Filed Weights   10/07/19 0441 10/08/19 0500 10/09/19 0453  Weight: 56.3 kg 55.7 kg 54.8 kg    Examination: General appearance: Cachectic adult male, lying in bed, head turned to the right.  HEENT: Large left-sided craniotomy scar.  Clean dry and intact. Skin:   Cardiac: Tachycardic,  regular, no lower extremity edema Respiratory: Respiratory rate normal, lungs clear without rales or wheezes.   Abdomen: No grimace to palpation, no distention or ascites. MSK: Severe diffuse loss of subcutaneous muscle mass and fat. Neuro: Head turned to the right, does not make eye contact, does not follow commands.. Psych:        Data Reviewed: I have personally reviewed following labs and imaging studies:  CBC: No results for input(s): WBC, NEUTROABS, HGB,  HCT, MCV, PLT in the last 168 hours. Basic Metabolic Panel: Recent Labs  Lab 10/04/19 0959 10/05/19 0321 10/06/19 0509 10/07/19 0312  NA 141 140 139 137  K 5.5* 4.9 5.8* 4.4  CL 103 100 97* 95*  CO2 _0 GLUCOSE 134* 134* 147* 134*  BUN 75* 79* 79* 70*  CREATININE 2.05* 2.15* 2.44* 2.18*  CALCIUM 10.2 10.4* 10.4* 10.1   GFR: Estimated Creatinine Clearance: 35.3 mL/min (A) (by C-G formula based on SCr of 2.18 mg/dL (H)). Liver Function Tests: No results for input(s): AST, ALT, ALKPHOS, BILITOT, PROT, ALBUMIN in the last 168 hours. No results for input(s): LIPASE, AMYLASE in the last 168 hours. No results for input(s): AMMONIA in the last 168 hours. Coagulation Profile: No results for input(s): INR, PROTIME in the last 168 hours. Cardiac Enzymes: No results for input(s): CKTOTAL, CKMB, CKMBINDEX, TROPONINI in the last 168 hours. BNP (last 3 results) No results for input(s): PROBNP in the last 8760 hours. HbA1C: No results for input(s): HGBA1C in the last 72 hours. CBG: Recent Labs  Lab 10/08/19 2038 10/08/19 2357 10/09/19 0341 10/09/19 0838 10/09/19 1237  GLUCAP 109* 104* 123* 113* 151*   Lipid Profile: No results for input(s): CHOL, HDL, LDLCALC, TRIG, CHOLHDL, LDLDIRECT in the last 72 hours. Thyroid Function Tests: No results for input(s): TSH, T4TOTAL, FREET4, T3FREE, THYROIDAB in the last 72 hours. Anemia Panel: No results for input(s): VITAMINB12, FOLATE, FERRITIN, TIBC, IRON, RETICCTPCT in the last 72 hours. Urine analysis:    Component Value Date/Time   COLORURINE YELLOW (A) 09/23/2019 1108   APPEARANCEUR CLEAR (A) 09/23/2019 1108   LABSPEC 1.015 09/23/2019 1108   PHURINE 7.5 09/23/2019 1108   GLUCOSEU NEGATIVE 09/23/2019 1108   HGBUR NEGATIVE 09/23/2019 1108   [place] 09/23/2019 1108   [place] 09/23/2019 1108   PROTEINUR NEGATIVE 09/23/2019 1108   UROBILINOGEN 1.0 04/05/2009 0021   NITRITE NEGATIVE 09/23/2019 1108    LEUKOCYTESUR NEGATIVE 09/23/2019 1108   Sepsis Labs: _1 (procalcitonin:4,lacticacidven:4)  )No results found for this or any previous visit (from the past 240 hour(s)).       Radiology Studies: No results found.      Scheduled Meds: . alteplase  2 mg Intracatheter Once  . amLODipine  10 mg Per Tube Daily  . chlorhexidine gluconate (MEDLINE KIT)  15 mL Mouth Rinse BID  . enoxaparin (LOVENOX) injection  40 mg Subcutaneous Daily  . folic acid  1 mg Per Tube Daily  . free water  250 mL Per Tube Q4H  . hydrochlorothiazide  13 mg Per Tube Daily  . insulin aspart  0-15 Units Subcutaneous Q4H  . labetalol  300 mg Per Tube TID  . levETIRAcetam  500 mg Per Tube BID  . lipase/protease/amylase)  20,880 Units Per Tube Once   And  . sodium bicarbonate  650 mg Per Tube Once  . mouth rinse  15 mL Mouth Rinse 10 times per day  . multivitamin  15 mL Per Tube Daily  . pantoprazole sodium  40 mg Per Tube BID  . QUEtiapine  50 mg Per Tube QHS  . sodium chloride flush  10-40 mL Intracatheter Q12H  . thiamine  100 mg Per Tube Daily   Continuous Infusions: . feeding supplement (OSMOLITE 1.5 CAL) 1,000 mL (10/09/19 1056)     LOS: 30 days    Time spent: 25 minutes    Edwin Dada, MD [facility] 10/09/2019, 6:23 PM     Please page through [place]:  [email or web address] Password [email or web address] If 7PM-7AM, please contact night-coverage

## 2019-10-09 NOTE — Progress Notes (Signed)
  Speech Language Pathology Treatment: Dysphagia;Cognitive-Linquistic  Patient Details Name: Joshua [name] MRN: [id number] DOB: [birth year] Today's Date: 10/09/2019 Time: [id number] SLP Time Calculation (min) (ACUTE ONLY): 22 min  Assessment / Plan / Recommendation Clinical Impression  Pt is alert but makes no vocalizations or verbalizations. He did appear to focus his gaze to SLP during name calling, but did not sustain any eye contact. SLP provided opportunities for command following but pt not following any commands at this time. He opened his mouth and even parted his dentition during a more functional context, closing his lips around the suction toothbrush but allowing SLP to brush his tongue. Oral holding of saliva and all bolus consistencies noted with suction used to remove boluses that did not spill anteriorly on their own. Will continue to follow acutely.   HPI HPI: Pt is a 38 year old male who presented with new seizure, and was found to have a large left BG intracranial hemorrhage s/p L frontal temporal craniectomy with evacuation 9/12. ETT 9/12-9/18; 9/18-9/23. PMH: HTN, alcoholism, noncompliance      SLP Plan  Continue with current plan of care       Recommendations  Diet recommendations: NPO Medication Administration: Via alternative means                Oral Care Recommendations: Oral care QID Follow up Recommendations: Skilled Nursing facility SLP Visit Diagnosis: Dysphagia, unspecified (R13.10);Cognitive communication deficit [phone]) Plan: Continue with current plan of care       [facility] Sonji Starkes 10/09/2019, 9:03 AM  Pollyann Glen, M.A. [place] Acute Environmental education officer [phone] Office [phone]

## 2019-10-10 LAB — BASIC METABOLIC PANEL
Anion gap: 15 (ref 5–15)
BUN: 50 mg/dL — ABNORMAL HIGH (ref 6–20)
CO2: 27 mmol/L (ref 22–32)
Calcium: 10 mg/dL (ref 8.9–10.3)
Chloride: 91 mmol/L — ABNORMAL LOW (ref 98–111)
Creatinine, Ser: 1.76 mg/dL — ABNORMAL HIGH (ref 0.61–1.24)
GFR calc Af Amer: 55 mL/min — ABNORMAL LOW (ref >60)
GFR calc non Af Amer: 48 mL/min — ABNORMAL LOW (ref >60)
Glucose, Bld: 102 mg/dL — ABNORMAL HIGH (ref 70–99)
Potassium: 4.1 mmol/L (ref 3.5–5.1)
Sodium: 133 mmol/L — ABNORMAL LOW (ref 135–145)

## 2019-10-10 LAB — GLUCOSE, CAPILLARY
Glucose-Capillary: 124 mg/dL — ABNORMAL HIGH (ref 70–99)
Glucose-Capillary: 129 mg/dL — ABNORMAL HIGH (ref 70–99)
Glucose-Capillary: 134 mg/dL — ABNORMAL HIGH (ref 70–99)
Glucose-Capillary: 146 mg/dL — ABNORMAL HIGH (ref 70–99)

## 2019-10-10 MED ORDER — OSMOLITE 1.5 CAL PO LIQD
1000.0000 mL | ORAL | Status: DC
  Administered 2019-10-10 – 2019-11-25 (×32): 1000 mL
  Filled 2019-10-10: qty 1185
  Filled 2019-10-10 (×2): qty 1000
  Filled 2019-10-10: qty 1185
  Filled 2019-10-10: qty 1000
  Filled 2019-10-10 (×4): qty 1185
  Filled 2019-10-10: qty 1000
  Filled 2019-10-10 (×4): qty 1185
  Filled 2019-10-10 (×2): qty 1000
  Filled 2019-10-10: qty 1185
  Filled 2019-10-10 (×2): qty 1000
  Filled 2019-10-10: qty 1185
  Filled 2019-10-10 (×3): qty 1000
  Filled 2019-10-10 (×2): qty 1185
  Filled 2019-10-10 (×2): qty 1000
  Filled 2019-10-10: qty 1185
  Filled 2019-10-10 (×2): qty 1000
  Filled 2019-10-10: qty 1185
  Filled 2019-10-10: qty 1000
  Filled 2019-10-10 (×4): qty 1185
  Filled 2019-10-10: qty 1000
  Filled 2019-10-10 (×6): qty 1185
  Filled 2019-10-10: qty 1000
  Filled 2019-10-10 (×5): qty 1185
  Filled 2019-10-10: qty 1000
  Filled 2019-10-10: qty 1185
  Filled 2019-10-10: qty 1000
  Filled 2019-10-10 (×2): qty 1185
  Filled 2019-10-10: qty 1000
  Filled 2019-10-10 (×2): qty 1185
  Filled 2019-10-10: qty 1000
  Filled 2019-10-10: qty 1185
  Filled 2019-10-10: qty 1000
  Filled 2019-10-10 (×2): qty 1185
  Filled 2019-10-10: qty 1000
  Filled 2019-10-10 (×3): qty 1185
  Filled 2019-10-10 (×5): qty 1000
  Filled 2019-10-10: qty 1185
  Filled 2019-10-10 (×3): qty 1000
  Filled 2019-10-10 (×2): qty 1185
  Filled 2019-10-10 (×2): qty 1000

## 2019-10-10 NOTE — Progress Notes (Signed)
Physical Therapy Treatment Patient Details Name: Joshua [name] MRN: [id number] DOB: Apr 14, 1981 Today's Date: 10/10/2019    History of Present Illness This pt is a 38 year old with history of alcohol abuse, seizures, noncompliant hypertension who presented with new seizure right-sided hemiplegia and confusion was found to have a large left intracranial hemorrhage now s/p left frontoparietal craniectomy for evacuation of left basal ganglia hematoma on 09/09/19. Skull flap in abdomen.  Intubated 9/12 (day of admission) and extubated 09/15/19.     PT Comments    PTA performed cervical rotation to L with 15 sec holds x 3.  He kept trying to move L hand to stop stretch due to pain.  Post stretching he was more neutral on cervical spine.  Pt performed B AAROM heel slides and RN entered room and reports when he is agitated his L leg will start marching HR elevated and tx stopped to allow patient some comfort.  He is not responding well to PT intervention.  Spoke with supervising PT about attempting tilt table.   Follow Up Recommendations  SNF     Equipment Recommendations  Wheelchair (measurements PT);Wheelchair cushion (measurements PT);Hospital bed;3in1 (PT)    Recommendations for Other Services       Precautions / Restrictions Precautions Precautions: Fall Precaution Comments: L crani, bone flap removed and in abdomen, NG, L mitt Restrictions Weight Bearing Restrictions: No    Mobility  Bed Mobility Overal bed mobility: Needs Assistance             General bed mobility comments: Total assistance to prop and reposition into modified chair position.  Transfers                    Ambulation/Gait                 Stairs             Wheelchair Mobility    Modified Rankin (Stroke Patients Only)       Balance                                            Cognition Arousal/Alertness: Awake/alert Behavior During Therapy: Flat  affect;Agitated(Initially flat but became agitated with cervical rotation to the L.) Overall Cognitive Status: Difficult to assess                                 General Comments: he continues to only respond to painful stimuli      Exercises Other Exercises Other Exercises: PROM for cervical rotaiton in supine 3x15 sec. Other Exercises: B AAROM heel slides 1x10 reps.    General Comments        Pertinent Vitals/Pain Pain Assessment: Faces Faces Pain Scale: Hurts little more Pain Location: neck with motion Pain Descriptors / Indicators: Grimacing;Guarding Pain Intervention(s): Monitored during session;Repositioned    Home Living                      Prior Function            PT Goals (current goals can now be found in the care plan section) Acute Rehab PT Goals Patient Stated Goal: none stated Potential to Achieve Goals: Fair Progress towards PT goals: Progressing toward goals    Frequency    Min 3X/week  PT Plan Current plan remains appropriate    Co-evaluation              AM-PAC PT "6 Clicks" Mobility   Outcome Measure  Help needed turning from your back to your side while in a flat bed without using bedrails?: Total Help needed moving from lying on your back to sitting on the side of a flat bed without using bedrails?: Total Help needed moving to and from a bed to a chair (including a wheelchair)?: Total Help needed standing up from a chair using your arms (e.g., wheelchair or bedside chair)?: Total Help needed to walk in hospital room?: Total Help needed climbing 3-5 steps with a railing? : Total 6 Click Score: 6    End of Session   Activity Tolerance: Treatment limited secondary to agitation(became agitated with cervical rotation and started marching L hip back and forth, RN reports he does this when he is upset.)   Nurse Communication: Mobility status PT Visit Diagnosis: Muscle weakness (generalized)  (M62.81);Difficulty in walking, not elsewhere classified (R26.2);Hemiplegia and hemiparesis Hemiplegia - Right/Left: Right Hemiplegia - dominant/non-dominant: Dominant Hemiplegia - caused by: Nontraumatic intracerebral hemorrhage     Time: [id number] PT Time Calculation (min) (ACUTE ONLY): 10 min  Charges:  $Therapeutic Exercise: 8-22 mins                     Governor Rooks, PTA Acute Rehabilitation Services Pager [phone] Office [phone]     Kyley Solow [name] 10/10/2019, 4:42 PM

## 2019-10-10 NOTE — Progress Notes (Signed)
Nutrition Follow-up  DOCUMENTATION CODES:   Not applicable  INTERVENTION:  Increase Osmolite 1.5 formula via PEG to new goal rate of 70 ml/hr.  Free water flushes of 250 ml every 4 hours per MD.   Tube feeding to provide 2520 kcal, 105 grams of protein, and 2777 ml free water.   NUTRITION DIAGNOSIS:   Inadequate oral intake related to inability to eat as evidenced by NPO status; ongoing  GOAL:   Patient will meet greater than or equal to 90% of their needs; met with TF  MONITOR:   TF tolerance, Weight trends  REASON FOR ASSESSMENT:   Ventilator, Consult Enteral/tube feeding initiation and management  ASSESSMENT:   38 yo male admitted with large left intracranial hemorrhage; S/P craniotomy. PMH includes HTN, alcoholism, noncompliance. 9/12 left frontal temporal craniectomy and evacuation hematoma. 9/23 Extubated. 10/1 PEG placed.   Pt continues on NPO status and has been tolerating his tube feeds well with no difficulties. Noted pt with gradual weight loss since admission. RD to increase tube feeding to ensure adequate nutrition is met and to prevent further weight loss.   Labs and medications reviewed.   Diet Order:   Diet Order            Diet NPO time specified  Diet effective midnight              EDUCATION NEEDS:   Not appropriate for education at this time  Skin:  Skin Assessment: Reviewed RN Assessment  Last BM:  10/4  Height:   Ht Readings from Last 1 Encounters:  09/09/19 '5\' 9"'  (1.753 m)    Weight:   Wt Readings from Last 1 Encounters:  10/09/19 54.8 kg    Ideal Body Weight:  72.7 kg  BMI:  Body mass index is 17.84 kg/m.  Estimated Nutritional Needs:   Kcal:  2100-2400  Protein:  105-125 grams  Fluid:  >/= 2 L/day    Corrin Parker, MS, RD, LDN Pager # [phone] After hours/ weekend pager # [phone]

## 2019-10-10 NOTE — Progress Notes (Signed)
PROGRESS NOTE    Joshua [name]  [id number] DOB: 10-Feb-1981 DOA: 09/09/2019 PCP: Patient, [name]      Brief Narrative:  Joshua [name] is a 38 y.o. M with no significant PMHx but apparently untreated severe HTN who presented with seizure-like activity, right-sided hemiparesis.  In the ER, Cr 2.29, K 2.4, CT head showed a large intraparenchymal hematoma and he was intubated.  Neurosurgery admitted the patient for emergent decompression.        Assessment & Plan:  Large LEFT intraparenchymal hemorrhage S/p decompressive craniectomy wth hematoma evacuation by Dr. Vertell Limber 9/12 Now densely aphasic, likely cognitively impaired, able to sit with 2-person assist, but movement limited by spontaneous left sided movement, inconsistent cue following.  Total assist for movement.  PEG dependent.  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  Remained 2.1-2.8 for three weeks.  Cr 1.7 today, K normal.  BUN elevated   Hypertension No prior treatment.  Last BP in our system 156/100 in 2013. BP stable, still with diastolic HTN despite addition of HCTZ -Continue amlodipine, labetalol, HCTZ  Seizure  Secondary to [place].  No recent seizure activity -Continue Keppra  Possible alcohol withdrawal Completed clonidine taper  -Continue folate  Acute metabolic acidosis Resolved  Sinus tachycardia Stable  Severe protein calorie malnutrition       MDM and disposition: The below labs and imaging reports reviewed and summarized above.  Medication management as above.     The patient was admitted with a large intraparenchymal hemorrhage, now completely disabled.  He is unable to make notes known and requires total care.  He is dependent for all ADLs, requires feeding by PEG tube.  Attempts to find a safe discharge  disposition are ongoing.       DVT prophylaxis: Lovenox Code Status: FULL Family Communication: Mother at bedside    Consultants:   Neurosurgery  Procedures:   9/12 intubation  9/12 craniotomy  9/18 extubation  9/18 re-intubation  9/23 extubation  10/1 EGD PEG placement    Antimicrobials:   Vancomycin 9/14 >> 9/15   Zosyn --> Unasyn 9/14 >> 9/20  Cefazolin --> Cephalexin 9/28 >> 10/2    Subjective: No new nursing complaints, no fever, no respiratory distress, no diarrhea, he does not follow commands or follow verbal stimuli or tactile stimuli.        Objective: Vitals:   10/10/19 1117 10/10/19 1552 10/10/19 1629 10/10/19 1654  BP: 106/72  (!) 121/97   Pulse: (!) 103 100 (!) 105 90  Resp: 17  17   Temp: 97.8 F (36.6 C)  (!) 97.5 F (36.4 C)   TempSrc: Oral  Oral   SpO2: 100%  100%   Weight:      Height:        Intake/Output Summary (Last 24 hours) at 10/10/2019 1842 Last data filed at 10/10/2019 1700 Gross per 24 hour  Intake -  Output 1910 ml  Net -1910 ml   Filed Weights   10/07/19 0441 10/08/19 0500 10/09/19 0453  Weight: 56.3 kg 55.7 kg 54.8 kg    Examination: General appearance: Adult male, lying in bed, head turned to the right.  HEENT: Large left-sided craniotomy scar, clean dry and intact Skin:   Cardiac: Heart rate regular, no murmurs, no lower extremity edema Respiratory: [place] rate normal, lungs clear without rales or wheezes Abdomen: Grimace to  palpation or distention or ascites, and abdominal device is palpated MSK: Severe diffuse loss of subcutaneous muscle mass and fat. Neuro: Head turned to the right, does not make eye contact, does not follow commands.   Psych:        Data Reviewed: I have personally reviewed following labs and imaging studies:  CBC: No results for input(s): WBC, NEUTROABS, HGB, HCT, MCV, PLT in the last 168 hours. Basic Metabolic Panel: Recent Labs  Lab 10/04/19 0959 10/05/19 0321  10/06/19 0509 10/07/19 0312 10/10/19 0402  NA 141 140 139 137 133*  K 5.5* 4.9 5.8* 4.4 4.1  CL 103 100 97* 95* 91*  CO2 '26 26 27 28 27  ' GLUCOSE 134* 134* 147* 134* 102*  BUN 75* 79* 79* 70* 50*  CREATININE 2.05* 2.15* 2.44* 2.18* 1.76*  CALCIUM 10.2 10.4* 10.4* 10.1 10.0   GFR: Estimated Creatinine Clearance: 43.7 mL/min (A) (by C-G formula based on SCr of 1.76 mg/dL (H)). Liver Function Tests: No results for input(s): AST, ALT, ALKPHOS, BILITOT, PROT, ALBUMIN in the last 168 hours. No results for input(s): LIPASE, AMYLASE in the last 168 hours. No results for input(s): AMMONIA in the last 168 hours. Coagulation Profile: No results for input(s): INR, PROTIME in the last 168 hours. Cardiac Enzymes: No results for input(s): CKTOTAL, CKMB, CKMBINDEX, TROPONINI in the last 168 hours. BNP (last 3 results) No results for input(s): PROBNP in the last 8760 hours. HbA1C: No results for input(s): HGBA1C in the last 72 hours. CBG: Recent Labs  Lab 10/09/19 0838 10/09/19 1237 10/10/19 0024 10/10/19 0422 10/10/19 1627  GLUCAP 113* 151* 129* 134* 146*   Lipid Profile: No results for input(s): CHOL, HDL, LDLCALC, TRIG, CHOLHDL, LDLDIRECT in the last 72 hours. Thyroid Function Tests: No results for input(s): TSH, T4TOTAL, FREET4, T3FREE, THYROIDAB in the last 72 hours. Anemia Panel: No results for input(s): VITAMINB12, FOLATE, FERRITIN, TIBC, IRON, RETICCTPCT in the last 72 hours. Urine analysis:    Component Value Date/Time   COLORURINE YELLOW (A) 09/23/2019 1108   APPEARANCEUR CLEAR (A) 09/23/2019 1108   LABSPEC 1.015 09/23/2019 1108   PHURINE 7.5 09/23/2019 1108   GLUCOSEU NEGATIVE 09/23/2019 1108   HGBUR NEGATIVE 09/23/2019 1108   [place] 09/23/2019 1108   [place] 09/23/2019 1108   PROTEINUR NEGATIVE 09/23/2019 1108   UROBILINOGEN 1.0 04/05/2009 0021   NITRITE NEGATIVE 09/23/2019 1108   LEUKOCYTESUR NEGATIVE 09/23/2019 1108   Sepsis Labs:  '@LABRCNTIP' (procalcitonin:4,lacticacidven:4)  )No results found for this or any previous visit (from the past 240 hour(s)).       Radiology Studies: No results found.      Scheduled Meds: . alteplase  2 mg Intracatheter Once  . amLODipine  10 mg Per Tube Daily  . chlorhexidine gluconate (MEDLINE KIT)  15 mL Mouth Rinse BID  . enoxaparin (LOVENOX) injection  40 mg Subcutaneous Daily  . folic acid  1 mg Per Tube Daily  . free water  250 mL Per Tube Q4H  . hydrochlorothiazide  13 mg Per Tube Daily  . insulin aspart  0-15 Units Subcutaneous Q4H  . labetalol  300 mg Per Tube TID  . levETIRAcetam  500 mg Per Tube BID  . lipase/protease/amylase)  20,880 Units Per Tube Once   And  . sodium bicarbonate  650 mg Per Tube Once  . mouth rinse  15 mL Mouth Rinse 10 times per day  . multivitamin  15 mL Per Tube Daily  . pantoprazole sodium  40 mg Per  Tube BID  . QUEtiapine  50 mg Per Tube QHS  . sodium chloride flush  10-40 mL Intracatheter Q12H  . thiamine  100 mg Per Tube Daily   Continuous Infusions: . feeding supplement (OSMOLITE 1.5 CAL) 1,000 mL (10/10/19 1515)     LOS: 31 days    Time spent: 25 minutes    Edwin Dada, MD [facility] 10/10/2019, 6:42 PM     Please page through [place]:  [email or web address] Password [email or web address] If 7PM-7AM, please contact night-coverage

## 2019-10-11 ENCOUNTER — Encounter (HOSPITAL_COMMUNITY): Payer: Self-pay | Admitting: Emergency Medicine

## 2019-10-11 LAB — GLUCOSE, CAPILLARY
Glucose-Capillary: 107 mg/dL — ABNORMAL HIGH (ref 70–99)
Glucose-Capillary: 108 mg/dL — ABNORMAL HIGH (ref 70–99)
Glucose-Capillary: 108 mg/dL — ABNORMAL HIGH (ref 70–99)
Glucose-Capillary: 108 mg/dL — ABNORMAL HIGH (ref 70–99)
Glucose-Capillary: 119 mg/dL — ABNORMAL HIGH (ref 70–99)
Glucose-Capillary: 120 mg/dL — ABNORMAL HIGH (ref 70–99)
Glucose-Capillary: 127 mg/dL — ABNORMAL HIGH (ref 70–99)
Glucose-Capillary: 158 mg/dL — ABNORMAL HIGH (ref 70–99)
Glucose-Capillary: 98 mg/dL (ref 70–99)

## 2019-10-11 NOTE — Progress Notes (Signed)
  Speech Language Pathology Treatment: Dysphagia;Cognitive-Linquistic  Patient Details Name: Joshua [name] MRN: [id number] DOB: 1981/08/19 Today's Date: 10/11/2019 Time: [id number] SLP Time Calculation (min) (ACUTE ONLY): 25 min  Assessment / Plan / Recommendation Clinical Impression  Pt seen at bedside for skilled ST intervention targeting goals for safe swallow and cognitive-linguistic function. Pt's girlfriend Lollie Marrow) was present, and indicated pt had vocalized (mmm) several times, and had been looking at her.   SLP performed limited oral care with suction - pt was uncooperative with opening his mouth to allow care of inside of the mouth. SLP used suction swab to clean outer surface of teeth, cheeks, and lips. Lollie Marrow asked why he wasn't eating anything. SLP explained that ST is following pt to assess readiness for po intake, but has thus far not been appropriate for po diet. SLP attempted to give pt an ice chip, however, pt did not open his mouth or make attempts to take it. Pt licked his lips once after SLP rubbed ice chip on his lips, but otherwise made no effort or response to it.  Pt did not make eye contact with SLP, and was unable to follow commands or demonstrate vocalization, despite multimodal cues.   ST will continue to follow pt acutely for dysphagia and cog/com treatment to maximize opportunities for improvement. Education regarding ST plan of care was reviewed with pt's girlfriend.    HPI HPI: Pt is a 38 year old male who presented with new seizure, and was found to have a large left BG intracranial hemorrhage s/p L frontal temporal craniectomy with evacuation 9/12. ETT 9/12-9/18; 9/18-9/23. PMH: HTN, alcoholism, noncompliance      SLP Plan  Continue with current plan of care;Goals updated       Recommendations  Diet recommendations: NPO Medication Administration: Via alternative means                Oral Care Recommendations: Oral care QID Follow up  Recommendations: Skilled Nursing facility;24 hour supervision/assistance SLP Visit Diagnosis: Dysphagia, unspecified (R13.10);Cognitive communication deficit (R41.841) Plan: Continue with current plan of care;Goals updated       [place] B. [name], [name], [place] Speech Language Pathologist Office: [phone] Pager: [phone]  [name] 10/11/2019, 1:44 PM

## 2019-10-11 NOTE — Progress Notes (Signed)
Occupational Therapy Treatment Patient Details Name: Joshua [name] MRN: [id number] DOB: February 19, 1981 Today's Date: 10/11/2019    History of present illness This pt is a 38 year old with history of alcohol abuse, seizures, noncompliant hypertension who presented with new seizure right-sided hemiplegia and confusion was found to have a large left intracranial hemorrhage now s/p left frontoparietal craniectomy for evacuation of left basal ganglia hematoma on 09/09/19. Skull flap in abdomen.  Intubated 9/12 (day of admission) and extubated 09/15/19.    OT comments  Pt progressing PROM to shoulder flex/abduction x10 reps tone noted. Pt with increased PROM of hand, wrist, elbow, and shoulder after doffing of splints. Pt with slight active flexion of RUE. Increased tone for RUE extension. Pt occasionally looking up at OT on L side, but unable to speak or follow any commads. Pt's eyes open, but no responding. Pt requires +2 totalA with no motivation for movement. OT following to continue to assess vision/splint needs.    Follow Up Recommendations  SNF;Supervision/Assistance - 24 hour    Equipment Recommendations  None recommended by OT    Recommendations for Other Services      Precautions / Restrictions Precautions Precautions: Fall Restrictions Weight Bearing Restrictions: No       Mobility Bed Mobility Overal bed mobility: Needs Assistance             General bed mobility comments: pt sat in bed as OTR with no extra physical assist and pt not attend to t ask  Transfers Overall transfer level: Needs assistance               General transfer comment: deferred    Balance Overall balance assessment: Needs assistance                                         ADL either performed or assessed with clinical judgement   ADL Overall ADL's : Needs assistance/impaired                                       General ADL Comments: Focused session on  splint check and ROM exercises.      Vision   Vision Assessment?: Vision impaired- to be further tested in functional context;Yes Additional Comments: Pt not attending to nearly anything.   Perception     Praxis      Cognition Arousal/Alertness: Awake/alert Behavior During Therapy: Flat affect Overall Cognitive Status: Difficult to assess                                 General Comments: Pt occasionally looking up at OT on L side, but unable to speak or follow any commads. Pt's eyes open        Exercises Exercises: Other exercises   Shoulder Instructions       General Comments      Pertinent Vitals/ Pain       Pain Assessment: Faces Faces Pain Scale: No hurt Pain Intervention(s): Limited activity within patient's tolerance  Home Living                                          Prior Functioning/Environment  Frequency  Min 2X/week        Progress Toward Goals  OT Goals(current goals can now be found in the care plan section)  Progress towards OT goals: Progressing toward goals  Acute Rehab OT Goals Patient Stated Goal: none stated OT Goal Formulation: Patient unable to participate in goal setting Time For Goal Achievement: 10/25/19 Potential to Achieve Goals: Good ADL Goals Pt Will Perform Grooming: with min assist;sitting Pt/caregiver will Perform Home Exercise Program: Increased ROM;Increased strength;Right Upper extremity;With minimal assist Additional ADL Goal #1: Pt will maintain static balance sitting EOB with modA to increase independence with ADL task. Additional ADL Goal #2: Pt will track towards R visual field with no more than mod cues. Additional ADL Goal #3: Pt will follow one step commands with 50% accuracy during functional task.  Plan Discharge plan needs to be updated    Co-evaluation                 AM-PAC OT "6 Clicks" Daily Activity     Outcome Measure   Help from another  person eating meals?: Total Help from another person taking care of personal grooming?: Total Help from another person toileting, which includes using toliet, bedpan, or urinal?: Total Help from another person bathing (including washing, rinsing, drying)?: Total Help from another person to put on and taking off regular upper body clothing?: Total Help from another person to put on and taking off regular lower body clothing?: Total 6 Click Score: 6    End of Session    OT Visit Diagnosis: Other abnormalities of gait and mobility (R26.89);Hemiplegia and hemiparesis;Cognitive communication deficit (R41.841) Symptoms and signs involving cognitive functions: Other Nontraumatic ICH Hemiplegia - Right/Left: Right   Activity Tolerance Treatment limited secondary to medical complications (Comment)   Patient Left in bed;with call bell/phone within reach;with bed alarm set;with restraints reapplied   Nurse Communication Mobility status        Time: [id number] OT Time Calculation (min): 17 min  Charges: OT General Charges $OT Visit: 1 Visit OT Treatments $Neuromuscular Re-education: 8-22 mins  Darryl Nestle) Marsa Aris OTR/L Acute Rehabilitation Services Pager: [phone] Office: [phone]    Audie Pinto 10/11/2019, 5:48 PM

## 2019-10-11 NOTE — Progress Notes (Signed)
PROGRESS NOTE  Joshua [name] HUD:[id number] DOB: 1981-11-08 DOA: 09/09/2019 PCP: Patient, [name]  HPI/Recap of past 24 hours: [name] is a 38 y.o. M with no significant PMHx but apparently untreated severe HTN who presented with seizure-like activity, right-sided hemiparesis.  In the ER, Cr 2.29, K 2.4, CT head showed a large intraparenchymal hematoma and he was intubated.  Neurosurgery admitted the patient for emergent decompression.  10/11/19: Patient was seen and examined at his bedside this morning. He is alert but does not track with his eyes.  Assessment/Plan: Active Problems:   ICH (intracerebral hemorrhage) (HCC)   Subdural hematoma (HCC)   Intracerebral hemorrhage (HCC)   Cytotoxic brain edema (HCC)   Hemorrhagic stroke (HCC)   Acute respiratory failure (HCC)   Essential hypertension   Severe protein-calorie malnutrition (HCC)   CKD (chronic kidney disease), stage IV (HCC)   Seizure (HCC)  Large left intraparenchymal hemorrhage status post decompressive craniectomy with hematoma evacuation on 09/09/19 by Dr. Vertell Limber Aphasic PTOT recs SNF Speech recs NPO  Seizure disorder likely secondary to intracranial hemorrhage No recent seizure activity Continue Keppra  Aspiration pneumonia Resolved Completed 7 days of abx (zosyn>> unasyn)  Klebsiella oxytoca UTI Completed abx  AKI on CKD 3, likely prerenal in the setting of dehydration  Baseline cr 1.76 with GFR 55, at his baseline creatinine Avoid nephrotoxins  Severe physical debility/ambulatory dysfunction PT OT rec SNF CSW consulted to assist in placement   DVT prophylaxis: Lovenox sq daily Code Status: FULL Family Communication: Mother at bedside    Consultants:   Neurosurgery  Procedures:   9/12 intubation  9/12 craniotomy  9/18 extubation  9/18 re-intubation  9/23 extubation  10/1 EGD PEG placement    Antimicrobials:   Vancomycin 9/14 >> 9/15   Zosyn --> Unasyn 9/14 >>  9/20  Cefazolin --> Cephalexin 9/28 >> 10/2     Objective: Vitals:   10/10/19 2315 10/11/19 0348 10/11/19 0807 10/11/19 1124  BP: 105/76 (!) 121/97 (!) 124/100 (!) 121/96  Pulse: 93 89 89 79  Resp: '17 17 16 16  ' Temp: 97.7 F (36.5 C) (!) 97.5 F (36.4 C) (!) 97.1 F (36.2 C) (!) 97.2 F (36.2 C)  TempSrc: Oral Oral Axillary Axillary  SpO2: 98% 96% 100% 100%  Weight:      Height:        Intake/Output Summary (Last 24 hours) at 10/11/2019 1157 Last data filed at 10/11/2019 0659 Gross per 24 hour  Intake 1340 ml  Output 1810 ml  Net -470 ml   Filed Weights   10/07/19 0441 10/08/19 0500 10/09/19 0453  Weight: 56.3 kg 55.7 kg 54.8 kg    Exam:  . General: 38 y.o. year-old male well developed well nourished in no acute distress.  Alert, non verbal does not track and does not follow commands.. . Cardiovascular: Regular rate and rhythm with no rubs or gallops.  No thyromegaly or JVD noted.   [name] Respiratory: Clear to auscultation with no wheezes or rales. Good inspiratory effort. . Abdomen: Soft nontender nondistended with normal bowel sounds x4 quadrants. peg tube in place . Musculoskeletal: Trace lower extremity edema. 2/4 pulses in all 4 extremities. [name] Psychiatry: Mood is appropriate for condition and setting   Data Reviewed: CBC: No results for input(s): WBC, NEUTROABS, HGB, HCT, MCV, PLT in the last 168 hours. Basic Metabolic Panel: Recent Labs  Lab 10/05/19 0321 10/06/19 0509 10/07/19 0312 10/10/19 0402  NA 140 139 137 133*  K 4.9 5.8* 4.4  4.1  CL 100 97* 95* 91*  CO2 '26 27 28 27  ' GLUCOSE 134* 147* 134* 102*  BUN 79* 79* 70* 50*  CREATININE 2.15* 2.44* 2.18* 1.76*  CALCIUM 10.4* 10.4* 10.1 10.0   GFR: Estimated Creatinine Clearance: 43.7 mL/min (A) (by C-G formula based on SCr of 1.76 mg/dL (H)). Liver Function Tests: No results for input(s): AST, ALT, ALKPHOS, BILITOT, PROT, ALBUMIN in the last 168 hours. No results for input(s): LIPASE, AMYLASE in the  last 168 hours. No results for input(s): AMMONIA in the last 168 hours. Coagulation Profile: No results for input(s): INR, PROTIME in the last 168 hours. Cardiac Enzymes: No results for input(s): CKTOTAL, CKMB, CKMBINDEX, TROPONINI in the last 168 hours. BNP (last 3 results) No results for input(s): PROBNP in the last 8760 hours. HbA1C: No results for input(s): HGBA1C in the last 72 hours. CBG: Recent Labs  Lab 10/10/19 1956 10/11/19 0018 10/11/19 0414 10/11/19 0806 10/11/19 1123  GLUCAP 124* 107* 119* 127* 98   Lipid Profile: No results for input(s): CHOL, HDL, LDLCALC, TRIG, CHOLHDL, LDLDIRECT in the last 72 hours. Thyroid Function Tests: No results for input(s): TSH, T4TOTAL, FREET4, T3FREE, THYROIDAB in the last 72 hours. Anemia Panel: No results for input(s): VITAMINB12, FOLATE, FERRITIN, TIBC, IRON, RETICCTPCT in the last 72 hours. Urine analysis:    Component Value Date/Time   COLORURINE YELLOW (A) 09/23/2019 1108   APPEARANCEUR CLEAR (A) 09/23/2019 1108   LABSPEC 1.015 09/23/2019 1108   PHURINE 7.5 09/23/2019 1108   GLUCOSEU NEGATIVE 09/23/2019 1108   HGBUR NEGATIVE 09/23/2019 1108   [place] 09/23/2019 1108   [place] 09/23/2019 1108   PROTEINUR NEGATIVE 09/23/2019 1108   UROBILINOGEN 1.0 04/05/2009 0021   NITRITE NEGATIVE 09/23/2019 1108   LEUKOCYTESUR NEGATIVE 09/23/2019 1108   Sepsis Labs: '@LABRCNTIP' (procalcitonin:4,lacticidven:4)  )No results found for this or any previous visit (from the past 240 hour(s)).    Studies: No results found.  Scheduled Meds: . alteplase  2 mg Intracatheter Once  . amLODipine  10 mg Per Tube Daily  . chlorhexidine gluconate (MEDLINE KIT)  15 mL Mouth Rinse BID  . enoxaparin (LOVENOX) injection  40 mg Subcutaneous Daily  . folic acid  1 mg Per Tube Daily  . free water  250 mL Per Tube Q4H  . hydrochlorothiazide  13 mg Per Tube Daily  . insulin aspart  0-15 Units Subcutaneous Q4H  . labetalol  300  mg Per Tube TID  . levETIRAcetam  500 mg Per Tube BID  . lipase/protease/amylase)  20,880 Units Per Tube Once   And  . sodium bicarbonate  650 mg Per Tube Once  . mouth rinse  15 mL Mouth Rinse 10 times per day  . multivitamin  15 mL Per Tube Daily  . pantoprazole sodium  40 mg Per Tube BID  . QUEtiapine  50 mg Per Tube QHS  . sodium chloride flush  10-40 mL Intracatheter Q12H  . thiamine  100 mg Per Tube Daily    Continuous Infusions: . feeding supplement (OSMOLITE 1.5 CAL) 1,000 mL (10/10/19 1515)     LOS: 32 days     Kayleen Memos, MD [facility] Pager [phone]  If 7PM-7AM, please contact night-coverage [email or web address] Password [email or web address] 10/11/2019, 11:57 AM

## 2019-10-12 LAB — GLUCOSE, CAPILLARY
Glucose-Capillary: 101 mg/dL — ABNORMAL HIGH (ref 70–99)
Glucose-Capillary: 116 mg/dL — ABNORMAL HIGH (ref 70–99)
Glucose-Capillary: 124 mg/dL — ABNORMAL HIGH (ref 70–99)
Glucose-Capillary: 125 mg/dL — ABNORMAL HIGH (ref 70–99)
Glucose-Capillary: 126 mg/dL — ABNORMAL HIGH (ref 70–99)
Glucose-Capillary: 135 mg/dL — ABNORMAL HIGH (ref 70–99)
Glucose-Capillary: 96 mg/dL (ref 70–99)

## 2019-10-12 LAB — CREATININE, SERUM
Creatinine, Ser: 1.84 mg/dL — ABNORMAL HIGH (ref 0.61–1.24)
GFR calc Af Amer: 52 mL/min — ABNORMAL LOW (ref >60)
GFR calc non Af Amer: 45 mL/min — ABNORMAL LOW (ref >60)

## 2019-10-12 NOTE — TOC Progression Note (Signed)
Transition of Care [facility]) - Progression Note    Patient Details  Name: Joshua [name] MRN: [id number] Date of Birth: [birth year]  Transition of Care [facility]) CM/SW Contact  Pollie Friar, RN Phone Number: 10/12/2019, 2:52 PM  Clinical Narrative:    Pt faxed out to [facility] per CSW management request. Helene Kelp with [facility] to review the information. TOC following.   Expected Discharge Plan: [place] Barriers to Discharge: Continued Medical Work up, SNF Pending payor source - LOG  Expected Discharge Plan and Services Expected Discharge Plan: [place] In-house Referral: Clinical Social Work Discharge Planning Services: NA Post Acute Care Choice: [place] Living arrangements for the past 2 months: Apartment                 DME Arranged: N/A DME Agency: NA       HH Arranged: NA HH Agency: NA         Social Determinants of Health (SDOH) Interventions    Readmission Risk Interventions No flowsheet data found.

## 2019-10-12 NOTE — Progress Notes (Signed)
PROGRESS NOTE  Joshua [name] PNP:[id number] DOB: 10-29-1981 DOA: 09/09/2019 PCP: Patient, [name]  HPI/Recap of past 24 hours: Joshua [name] is a 38 y.o. M with no significant PMHx but apparently untreated severe HTN who presented with seizure-like activity, right-sided hemiparesis.  In the ER, Cr 2.29, K 2.4, CT head showed a large intraparenchymal hematoma and he was intubated.  Neurosurgery admitted the patient for emergent decompression.  10/12/19: Patient was seen and examined at his bedside.  No acute events overnight.  He is somnolent and does not participate in the exam.   Assessment/Plan: Active Problems:   ICH (intracerebral hemorrhage) (HCC)   Subdural hematoma (HCC)   Intracerebral hemorrhage (HCC)   Cytotoxic brain edema (HCC)   Hemorrhagic stroke (HCC)   Acute respiratory failure (HCC)   Essential hypertension   Severe protein-calorie malnutrition (HCC)   CKD (chronic kidney disease), stage IV (HCC)   Seizure (HCC)  Large left intraparenchymal hemorrhage status post decompressive craniectomy with hematoma evacuation on 09/09/19 by Dr. Vertell Limber Aphasic PTOT recs SNF Speech recs NPO  Seizure disorder likely secondary to intracranial hemorrhage No recent seizure activity Continue Keppra  Aspiration pneumonia Resolved Completed 7 days of abx (zosyn>> unasyn) O2 saturation 100% on room air  Transient uncontrolled hypertension, unclear etiology Continue to monitor vital signs Continue home antihypertensive Currently on amlodipine 10 mg daily, HCTZ 13 mg daily per tube, 200 mg labetalol per tube 3 times daily Hold HCTZ due to recent AKI likely prerenal to avoid dehydration  Klebsiella oxytoca UTI Completed abx  AKI on CKD 3, likely prerenal in the setting of dehydration  Baseline cr 1.76 with GFR 55, at his baseline creatinine Creatinine 1.84 on 10/12/2019 Urine output 1.5 L recorded in the last 24 hours Avoid nephrotoxins  Severe physical  debility/ambulatory dysfunction PT OT rec SNF CSW assisting with placement   DVT prophylaxis: Lovenox sq daily Code Status: FULL Family Communication: Mother at bedside    Consultants:   Neurosurgery  Procedures:   9/12 intubation  9/12 craniotomy  9/18 extubation  9/18 re-intubation  9/23 extubation  10/1 EGD PEG placement    Antimicrobials:   Vancomycin 9/14 >> 9/15   Zosyn --> Unasyn 9/14 >> 9/20  Cefazolin --> Cephalexin 9/28 >> 10/2     Objective: Vitals:   10/12/19 0046 10/12/19 0405 10/12/19 0800 10/12/19 1135  BP: 109/82 121/84 (!) 122/91 (!) 127/109  Pulse: 92 89 86 79  Resp: _0 Temp: (!) 97.4 F (36.3 C) (!) 97.5 F (36.4 C) (!) 96.9 F (36.1 C) 98.7 F (37.1 C)  TempSrc: Oral Oral Axillary Oral  SpO2: 100% 100% 100% 100%  Weight:      Height:        Intake/Output Summary (Last 24 hours) at 10/12/2019 1508 Last data filed at 10/12/2019 1143 Gross per 24 hour  Intake -  Output 1900 ml  Net -1900 ml   Filed Weights   10/07/19 0441 10/08/19 0500 10/09/19 0453  Weight: 56.3 kg 55.7 kg 54.8 kg    Exam:  . General: 38 y.o. year-old male frail in no acute distress.  Somnolent.  Arousable.   . Cardiovascular: Regular rate and rhythm no rubs or gallops no JVD or thyromegaly.   [name] Respiratory: Clear resuscitation no wheezes or rales.  Poor inspiratory effort.   . Abdomen: Soft nontender nondistended bowel sounds present.  PEG tube in place.   . Musculoskeletal: No lower extremity edema.  2 out of 4 pulses  in all 4 extremities.   [name] Psychiatry: Unable to assess mood due to somnolence.   Data Reviewed: CBC: No results for input(s): WBC, NEUTROABS, HGB, HCT, MCV, PLT in the last 168 hours. Basic Metabolic Panel: Recent Labs  Lab 10/06/19 0509 10/07/19 0312 10/10/19 0402 10/12/19 0626  NA 139 137 133*  --   K 5.8* 4.4 4.1  --   CL 97* 95* 91*  --   CO2 _0 --   GLUCOSE 147* 134* 102*  --   BUN 79* 70* 50*   --   CREATININE 2.44* 2.18* 1.76* 1.84*  CALCIUM 10.4* 10.1 10.0  --    GFR: Estimated Creatinine Clearance: 41.8 mL/min (A) (by C-G formula based on SCr of 1.84 mg/dL (H)). Liver Function Tests: No results for input(s): AST, ALT, ALKPHOS, BILITOT, PROT, ALBUMIN in the last 168 hours. No results for input(s): LIPASE, AMYLASE in the last 168 hours. No results for input(s): AMMONIA in the last 168 hours. Coagulation Profile: No results for input(s): INR, PROTIME in the last 168 hours. Cardiac Enzymes: No results for input(s): CKTOTAL, CKMB, CKMBINDEX, TROPONINI in the last 168 hours. BNP (last 3 results) No results for input(s): PROBNP in the last 8760 hours. HbA1C: No results for input(s): HGBA1C in the last 72 hours. CBG: Recent Labs  Lab 10/11/19 2056 10/12/19 0010 10/12/19 0400 10/12/19 0801 10/12/19 1134  GLUCAP 101* 125* 126* 124* 135*   Lipid Profile: No results for input(s): CHOL, HDL, LDLCALC, TRIG, CHOLHDL, LDLDIRECT in the last 72 hours. Thyroid Function Tests: No results for input(s): TSH, T4TOTAL, FREET4, T3FREE, THYROIDAB in the last 72 hours. Anemia Panel: No results for input(s): VITAMINB12, FOLATE, FERRITIN, TIBC, IRON, RETICCTPCT in the last 72 hours. Urine analysis:    Component Value Date/Time   COLORURINE YELLOW (A) 09/23/2019 1108   APPEARANCEUR CLEAR (A) 09/23/2019 1108   LABSPEC 1.015 09/23/2019 1108   PHURINE 7.5 09/23/2019 1108   GLUCOSEU NEGATIVE 09/23/2019 1108   HGBUR NEGATIVE 09/23/2019 1108   [place] 09/23/2019 1108   [place] 09/23/2019 1108   PROTEINUR NEGATIVE 09/23/2019 1108   UROBILINOGEN 1.0 04/05/2009 0021   NITRITE NEGATIVE 09/23/2019 1108   LEUKOCYTESUR NEGATIVE 09/23/2019 1108   Sepsis Labs: _1 (procalcitonin:4,lacticidven:4)  )No results found for this or any previous visit (from the past 240 hour(s)).    Studies: No results found.  Scheduled Meds: . alteplase  2 mg Intracatheter Once  .  amLODipine  10 mg Per Tube Daily  . chlorhexidine gluconate (MEDLINE KIT)  15 mL Mouth Rinse BID  . enoxaparin (LOVENOX) injection  40 mg Subcutaneous Daily  . folic acid  1 mg Per Tube Daily  . free water  250 mL Per Tube Q4H  . hydrochlorothiazide  13 mg Per Tube Daily  . insulin aspart  0-15 Units Subcutaneous Q4H  . labetalol  300 mg Per Tube TID  . levETIRAcetam  500 mg Per Tube BID  . lipase/protease/amylase)  20,880 Units Per Tube Once   And  . sodium bicarbonate  650 mg Per Tube Once  . mouth rinse  15 mL Mouth Rinse 10 times per day  . multivitamin  15 mL Per Tube Daily  . pantoprazole sodium  40 mg Per Tube BID  . QUEtiapine  50 mg Per Tube QHS  . sodium chloride flush  10-40 mL Intracatheter Q12H  . thiamine  100 mg Per Tube Daily    Continuous Infusions: . feeding supplement (OSMOLITE 1.5 CAL) 1,000  mL (10/10/19 1515)     LOS: 33 days     Kayleen Memos, MD [facility] Pager [phone]  If 7PM-7AM, please contact night-coverage [email or web address] Password [email or web address] 10/12/2019, 3:08 PM

## 2019-10-13 LAB — GLUCOSE, CAPILLARY
Glucose-Capillary: 125 mg/dL — ABNORMAL HIGH (ref 70–99)
Glucose-Capillary: 125 mg/dL — ABNORMAL HIGH (ref 70–99)
Glucose-Capillary: 132 mg/dL — ABNORMAL HIGH (ref 70–99)
Glucose-Capillary: 139 mg/dL — ABNORMAL HIGH (ref 70–99)

## 2019-10-13 NOTE — Progress Notes (Addendum)
PROGRESS NOTE  Vrishank Moster PFX:[id number] DOB: [birth year] DOA: 09/09/2019 PCP: Patient, [name]  HPI/Recap of past 24 hours: Mr. Kreuser is a 38 y.o. M with no significant PMHx but apparently untreated severe HTN who presented with seizure-like activity, right-sided hemiparesis.  In the ER, Cr 2.29, K 2.4, CT head showed a large intraparenchymal hematoma and he was intubated.  Neurosurgery admitted the patient for emergent decompression.  10/12/19: Patient was seen and examined at his bedside.  No acute events overnight.  He is somnolent and does not participate in the exam.  10/13/19: Patient was seen and examined his bedside this morning somnolent but easily arousable.   No acute events overnight.  Awaiting placement.     Assessment/Plan: Active Problems:   ICH (intracerebral hemorrhage) (HCC)   Subdural hematoma (HCC)   Intracerebral hemorrhage (HCC)   Cytotoxic brain edema (HCC)   Hemorrhagic stroke (HCC)   Acute respiratory failure (HCC)   Essential hypertension   Severe protein-calorie malnutrition (HCC)   CKD (chronic kidney disease), stage IV (HCC)   Seizure (HCC)  Large left intraparenchymal hemorrhage status post decompressive craniectomy with hematoma evacuation on 09/09/19 by Dr. Vertell Limber Aphasic PTOT recs SNF Speech recs NPO  Seizure disorder likely secondary to intracranial hemorrhage No recent seizure activity Continue Keppra  Aspiration pneumonia Resolved Completed 7 days of abx (zosyn>> unasyn) O2 saturation 100% on room air  Transient uncontrolled hypertension, unclear etiology Continue to monitor vital signs Continue home antihypertensive Currently on amlodipine 10 mg daily, HCTZ 13 mg daily per tube, 200 mg labetalol per tube 3 times daily Hold HCTZ due to recent AKI likely prerenal to avoid dehydration  Klebsiella oxytoca UTI Completed abx  AKI on CKD 3, likely prerenal in the setting of dehydration  Baseline cr 1.76 with GFR 55, at his  baseline creatinine Creatinine 1.84 on 10/12/2019 Urine output 1.5 L recorded in the last 24 hours Avoid nephrotoxins Continue to hold off HCTZ Continue free water flushes to avoid dehydration  Severe physical debility/ambulatory dysfunction PT OT rec SNF CSW assisting with placement   DVT prophylaxis: Lovenox sq daily Code Status: FULL Family Communication: Mother at bedside    Consultants:   Neurosurgery  Procedures:   9/12 intubation  9/12 craniotomy  9/18 extubation  9/18 re-intubation  9/23 extubation  10/1 EGD PEG placement    Antimicrobials:   Vancomycin 9/14 >> 9/15   Zosyn --> Unasyn 9/14 >> 9/20  Cefazolin --> Cephalexin 9/28 >> 10/2     Objective: Vitals:   10/13/19 0100 10/13/19 0408 10/13/19 0805 10/13/19 1208  BP:  103/78 118/88 115/80  Pulse:  80 88 93  Resp:  (!) 21    Temp:  98.1 F (36.7 C) 99.1 F (37.3 C) 98.5 F (36.9 C)  TempSrc:  Oral Oral Oral  SpO2:  100% 100% 99%  Weight: 55 kg     Height:        Intake/Output Summary (Last 24 hours) at 10/13/2019 1335 Last data filed at 10/13/2019 1150 Gross per 24 hour  Intake -  Output 1630 ml  Net -1630 ml   Filed Weights   10/08/19 0500 10/09/19 0453 10/13/19 0100  Weight: 55.7 kg 54.8 kg 55 kg    Exam:  . General: 38 y.o. year-old male frail-appearing no acute distress.  Somnolent but easily arousable.   . Cardiovascular: Regular rate and rhythm no rubs or gallops no JVD or thyromegaly.   [name] Respiratory: Clear to auscultation no wheezes no rales.  Poor inspiratory effort.   . Abdomen: Soft nontender bowel sounds present.  PEG tube in place. . Musculoskeletal: No lower extremity edema.  2 out of 4 pulses in all 4 extremities.  [name] Psychiatry: Mood is appropriate for condition and setting.   Data Reviewed: CBC: No results for input(s): WBC, NEUTROABS, HGB, HCT, MCV, PLT in the last 168 hours. Basic Metabolic Panel: Recent Labs  Lab 10/07/19 0312 10/10/19 0402  10/12/19 0626  NA 137 133*  --   K 4.4 4.1  --   CL 95* 91*  --   CO2 28 27  --   GLUCOSE 134* 102*  --   BUN 70* 50*  --   CREATININE 2.18* 1.76* 1.84*  CALCIUM 10.1 10.0  --    GFR: Estimated Creatinine Clearance: 41.9 mL/min (A) (by C-G formula based on SCr of 1.84 mg/dL (H)). Liver Function Tests: No results for input(s): AST, ALT, ALKPHOS, BILITOT, PROT, ALBUMIN in the last 168 hours. No results for input(s): LIPASE, AMYLASE in the last 168 hours. No results for input(s): AMMONIA in the last 168 hours. Coagulation Profile: No results for input(s): INR, PROTIME in the last 168 hours. Cardiac Enzymes: No results for input(s): CKTOTAL, CKMB, CKMBINDEX, TROPONINI in the last 168 hours. BNP (last 3 results) No results for input(s): PROBNP in the last 8760 hours. HbA1C: No results for input(s): HGBA1C in the last 72 hours. CBG: Recent Labs  Lab 10/12/19 1640 10/12/19 1938 10/13/19 0025 10/13/19 0409 10/13/19 1206  GLUCAP 96 116* 125* 132* 139*   Lipid Profile: No results for input(s): CHOL, HDL, LDLCALC, TRIG, CHOLHDL, LDLDIRECT in the last 72 hours. Thyroid Function Tests: No results for input(s): TSH, T4TOTAL, FREET4, T3FREE, THYROIDAB in the last 72 hours. Anemia Panel: No results for input(s): VITAMINB12, FOLATE, FERRITIN, TIBC, IRON, RETICCTPCT in the last 72 hours. Urine analysis:    Component Value Date/Time   COLORURINE YELLOW (A) 09/23/2019 1108   APPEARANCEUR CLEAR (A) 09/23/2019 1108   LABSPEC 1.015 09/23/2019 1108   PHURINE 7.5 09/23/2019 1108   GLUCOSEU NEGATIVE 09/23/2019 1108   HGBUR NEGATIVE 09/23/2019 1108   [place] 09/23/2019 1108   [place] 09/23/2019 1108   PROTEINUR NEGATIVE 09/23/2019 1108   UROBILINOGEN 1.0 04/05/2009 0021   NITRITE NEGATIVE 09/23/2019 1108   LEUKOCYTESUR NEGATIVE 09/23/2019 1108   Sepsis Labs: _0 (procalcitonin:4,lacticidven:4)  )No results found for this or any previous visit (from the  past 240 hour(s)).    Studies: No results found.  Scheduled Meds: . alteplase  2 mg Intracatheter Once  . amLODipine  10 mg Per Tube Daily  . chlorhexidine gluconate (MEDLINE KIT)  15 mL Mouth Rinse BID  . enoxaparin (LOVENOX) injection  40 mg Subcutaneous Daily  . folic acid  1 mg Per Tube Daily  . free water  250 mL Per Tube Q4H  . insulin aspart  0-15 Units Subcutaneous Q4H  . labetalol  300 mg Per Tube TID  . levETIRAcetam  500 mg Per Tube BID  . lipase/protease/amylase)  20,880 Units Per Tube Once   And  . sodium bicarbonate  650 mg Per Tube Once  . mouth rinse  15 mL Mouth Rinse 10 times per day  . multivitamin  15 mL Per Tube Daily  . pantoprazole sodium  40 mg Per Tube BID  . QUEtiapine  50 mg Per Tube QHS  . sodium chloride flush  10-40 mL Intracatheter Q12H  . thiamine  100 mg Per Tube Daily  Continuous Infusions: . feeding supplement (OSMOLITE 1.5 CAL) 1,000 mL (10/13/19 0447)     LOS: 34 days     Kayleen Memos, MD [facility] Pager [phone]  If 7PM-7AM, please contact night-coverage [email or web address] Password [email or web address] 10/13/2019, 1:35 PM

## 2019-10-13 NOTE — Progress Notes (Signed)
Physical Therapy Treatment Patient Details Name: Joshua [name] MRN: [id number] DOB: [birth year] Today's Date: 10/13/2019    History of Present Illness This pt is a 38 year old with history of alcohol abuse, seizures, noncompliant hypertension who presented with new seizure right-sided hemiplegia and confusion was found to have a large left intracranial hemorrhage now s/p left frontoparietal craniectomy for evacuation of left basal ganglia hematoma on 09/09/19. Skull flap in abdomen.  Intubated 9/12 (day of admission) and extubated 09/15/19.     PT Comments    Late entry note from 10/12/2019.  Session focused on PROM to all extremities, cervical stretching/ROM and repositioning. Music used to engage patient during session, however, pt not following any commands. Frequency updated based on lack of progress.    Follow Up Recommendations  SNF     Equipment Recommendations  Wheelchair (measurements PT);Wheelchair cushion (measurements PT);Hospital bed;Other (comment);3in1 (PT)(hoyer lift)    Recommendations for Other Services       Precautions / Restrictions Precautions Precautions: Fall Restrictions Weight Bearing Restrictions: No    Mobility  Bed Mobility Overal bed mobility: Needs Assistance             General bed mobility comments: totalA for repositioning in bed  Transfers                    Ambulation/Gait                 Stairs             Wheelchair Mobility    Modified Rankin (Stroke Patients Only) Modified Rankin (Stroke Patients Only) Pre-Morbid Rankin Score: No symptoms Modified Rankin: Severe disability     Balance                                            Cognition Arousal/Alertness: Awake/alert Behavior During Therapy: Flat affect Overall Cognitive Status: Difficult to assess                                 General Comments: Pt awake, no command following, visual tracking or  verbalizations      Exercises General Exercises - Upper Extremity Elbow Flexion: PROM;Both;10 reps Elbow Extension: PROM;Both;10 reps Digit Composite Flexion: PROM;Right;Supine;10 reps Composite Extension: PROM;Right;Supine;10 reps Other Exercises Other Exercises: PROM BUE D1/D2  Other Exercises: PROM BLE D1/D2 Other Exercises: Supine: cervical stretching into left cervical flexion and rotation    General Comments        Pertinent Vitals/Pain Pain Assessment: Faces Faces Pain Scale: No hurt    Home Living                      Prior Function            PT Goals (current goals can now be found in the care plan section) Acute Rehab PT Goals Patient Stated Goal: none stated PT Goal Formulation: Patient unable to participate in goal setting Time For Goal Achievement: 10/19/19 Potential to Achieve Goals: Fair Progress towards PT goals: Not progressing toward goals - comment    Frequency    Min 2X/week      PT Plan Frequency needs to be updated    Co-evaluation              AM-PAC PT "6 Clicks" Mobility   Outcome  Measure  Help needed turning from your back to your side while in a flat bed without using bedrails?: Total Help needed moving from lying on your back to sitting on the side of a flat bed without using bedrails?: Total Help needed moving to and from a bed to a chair (including a wheelchair)?: Total Help needed standing up from a chair using your arms (e.g., wheelchair or bedside chair)?: Total Help needed to walk in hospital room?: Total Help needed climbing 3-5 steps with a railing? : Total 6 Click Score: 6    End of Session   Activity Tolerance: Patient tolerated treatment well(vitals stable) Patient left: in bed;with call bell/phone within reach;with bed alarm set Nurse Communication: Mobility status PT Visit Diagnosis: Muscle weakness (generalized) (M62.81);Difficulty in walking, not elsewhere classified (R26.2);Hemiplegia and  hemiparesis Hemiplegia - Right/Left: Right Hemiplegia - dominant/non-dominant: Dominant Hemiplegia - caused by: Nontraumatic intracerebral hemorrhage     Time: [id number] PT Time Calculation (min) (ACUTE ONLY): 23 min  Charges:  $Therapeutic Exercise: 23-37 mins                     Ellamae Sia, PT, DPT Acute Rehabilitation Services Pager [phone] Office [phone]    Willy Eddy 10/13/2019, 7:54 AM

## 2019-10-14 LAB — GLUCOSE, CAPILLARY
Glucose-Capillary: 102 mg/dL — ABNORMAL HIGH (ref 70–99)
Glucose-Capillary: 104 mg/dL — ABNORMAL HIGH (ref 70–99)
Glucose-Capillary: 110 mg/dL — ABNORMAL HIGH (ref 70–99)
Glucose-Capillary: 126 mg/dL — ABNORMAL HIGH (ref 70–99)
Glucose-Capillary: 127 mg/dL — ABNORMAL HIGH (ref 70–99)
Glucose-Capillary: 128 mg/dL — ABNORMAL HIGH (ref 70–99)
Glucose-Capillary: 133 mg/dL — ABNORMAL HIGH (ref 70–99)
Glucose-Capillary: 136 mg/dL — ABNORMAL HIGH (ref 70–99)
Glucose-Capillary: 80 mg/dL (ref 70–99)

## 2019-10-14 NOTE — Progress Notes (Signed)
PROGRESS NOTE  Joshua [name] [id number] DOB: 04/22/1981 DOA: 09/09/2019 PCP: Patient, [name]  HPI/Recap of past 24 hours: Joshua [name] is a 38 y.o. M with no significant PMHx but apparently untreated severe HTN who presented with seizure-like activity, right-sided hemiparesis.  In the ER, Cr 2.29, K 2.4, CT head showed a large intraparenchymal hematoma and he was intubated.  Neurosurgery admitted the patient for emergent decompression.  10/14/19: Patient seen and examined at bedside this morning.  No acute events overnight.  He is alert, does not follow commands.  Vital signs reviewed and are stable.  Blood pressure at goal.  O2 saturation 100% on room air  Awaiting placement.  CSW assisting in placement.  Highly appreciated.    Assessment/Plan: Active Problems:   ICH (intracerebral hemorrhage) (HCC)   Subdural hematoma (HCC)   Intracerebral hemorrhage (HCC)   Cytotoxic brain edema (HCC)   Hemorrhagic stroke (HCC)   Acute respiratory failure (HCC)   Essential hypertension   Severe protein-calorie malnutrition (HCC)   CKD (chronic kidney disease), stage IV (HCC)   Seizure (HCC)  Large left intraparenchymal hemorrhage status post decompressive craniectomy with hematoma evacuation on 09/09/19 by Dr. Vertell [name] Aphasic PTOT recs SNF Speech recs NPO  Seizure disorder likely secondary to intracranial hemorrhage No recent seizure activity Continue Keppra  Aspiration pneumonia Resolved Completed 7 days of abx (zosyn>> unasyn) O2 saturation 100% on room air  Transient uncontrolled hypertension, unclear etiology Continue to monitor vital signs Continue home antihypertensive Currently on amlodipine 10 mg daily, HCTZ 13 mg daily per tube, 200 mg labetalol per tube 3 times daily Hold HCTZ due to recent AKI likely prerenal to avoid dehydration  Klebsiella oxytoca UTI Completed abx  AKI on CKD 3, likely prerenal in the setting of dehydration  Baseline cr 1.76 with GFR 55, at  his baseline creatinine Creatinine 1.84 on 10/12/2019 Urine output 1.5 L recorded in the last 24 hours Avoid nephrotoxins Continue to hold off HCTZ Continue free water flushes to avoid dehydration  Severe physical debility/ambulatory dysfunction PT OT rec SNF CSW assisting with placement   DVT prophylaxis: Lovenox sq daily Code Status: FULL Family Communication: Mother at bedside    Consultants:   Neurosurgery  Procedures:   9/12 intubation  9/12 craniotomy  9/18 extubation  9/18 re-intubation  9/23 extubation  10/1 EGD PEG placement    Antimicrobials:   Vancomycin 9/14 >> 9/15   Zosyn --> Unasyn 9/14 >> 9/20  Cefazolin --> Cephalexin 9/28 >> 10/2     Objective: Vitals:   10/14/19 0007 10/14/19 0352 10/14/19 0401 10/14/19 0757  BP: 105/84 (!) 124/98  134/81  Pulse: 76 75  76  Resp: _0 Temp: 97.8 F (36.6 C) 98.2 F (36.8 C)  98.2 F (36.8 C)  TempSrc:  Oral  Oral  SpO2: 100% 100%  100%  Weight:   56.2 kg   Height:        Intake/Output Summary (Last 24 hours) at 10/14/2019 1219 Last data filed at 10/14/2019 0400 Gross per 24 hour  Intake -  Output 1200 ml  Net -1200 ml   Filed Weights   10/09/19 0453 10/13/19 0100 10/14/19 0401  Weight: 54.8 kg 55 kg 56.2 kg    Exam:  . General: 38 y.o. year-old male frail-appearing no acute distress.  Alert but does not follow commands.   . Cardiovascular: Regular rate and rhythm no rubs or gallops no JVD.   [name] Respiratory: Clear to auscultation no wheezing  no rales.  Poor respiratory effort.   . Abdomen: Soft nontender bowel sounds present.  PEG tube in place.   . Musculoskeletal: No lower extremity edema.  2 out of 4 pulses in all 4 extremities.   [name] Psychiatry: Mood is appropriate for condition and setting.   Data Reviewed: CBC: No results for input(s): WBC, NEUTROABS, HGB, HCT, MCV, PLT in the last 168 hours. Basic Metabolic Panel: Recent Labs  Lab 10/10/19 0402 10/12/19 0626   NA 133*  --   K 4.1  --   CL 91*  --   CO2 27  --   GLUCOSE 102*  --   BUN 50*  --   CREATININE 1.76* 1.84*  CALCIUM 10.0  --    GFR: Estimated Creatinine Clearance: 42.8 mL/min (A) (by C-G formula based on SCr of 1.84 mg/dL (H)). Liver Function Tests: No results for input(s): AST, ALT, ALKPHOS, BILITOT, PROT, ALBUMIN in the last 168 hours. No results for input(s): LIPASE, AMYLASE in the last 168 hours. No results for input(s): AMMONIA in the last 168 hours. Coagulation Profile: No results for input(s): INR, PROTIME in the last 168 hours. Cardiac Enzymes: No results for input(s): CKTOTAL, CKMB, CKMBINDEX, TROPONINI in the last 168 hours. BNP (last 3 results) No results for input(s): PROBNP in the last 8760 hours. HbA1C: No results for input(s): HGBA1C in the last 72 hours. CBG: Recent Labs  Lab 10/13/19 1619 10/13/19 2024 10/14/19 0006 10/14/19 0354 10/14/19 0957  GLUCAP 133* 125* 104* 126* 136*   Lipid Profile: No results for input(s): CHOL, HDL, LDLCALC, TRIG, CHOLHDL, LDLDIRECT in the last 72 hours. Thyroid Function Tests: No results for input(s): TSH, T4TOTAL, FREET4, T3FREE, THYROIDAB in the last 72 hours. Anemia Panel: No results for input(s): VITAMINB12, FOLATE, FERRITIN, TIBC, IRON, RETICCTPCT in the last 72 hours. Urine analysis:    Component Value Date/Time   COLORURINE YELLOW (A) 09/23/2019 1108   APPEARANCEUR CLEAR (A) 09/23/2019 1108   LABSPEC 1.015 09/23/2019 1108   PHURINE 7.5 09/23/2019 1108   GLUCOSEU NEGATIVE 09/23/2019 1108   HGBUR NEGATIVE 09/23/2019 1108   [place] 09/23/2019 1108   [place] 09/23/2019 1108   PROTEINUR NEGATIVE 09/23/2019 1108   UROBILINOGEN 1.0 04/05/2009 0021   NITRITE NEGATIVE 09/23/2019 1108   LEUKOCYTESUR NEGATIVE 09/23/2019 1108   Sepsis Labs: _0 (procalcitonin:4,lacticidven:4)  )No results found for this or any previous visit (from the past 240 hour(s)).    Studies: No results  found.  Scheduled Meds: . alteplase  2 mg Intracatheter Once  . amLODipine  10 mg Per Tube Daily  . chlorhexidine gluconate (MEDLINE KIT)  15 mL Mouth Rinse BID  . enoxaparin (LOVENOX) injection  40 mg Subcutaneous Daily  . folic acid  1 mg Per Tube Daily  . free water  250 mL Per Tube Q4H  . insulin aspart  0-15 Units Subcutaneous Q4H  . labetalol  300 mg Per Tube TID  . levETIRAcetam  500 mg Per Tube BID  . lipase/protease/amylase)  20,880 Units Per Tube Once   And  . sodium bicarbonate  650 mg Per Tube Once  . mouth rinse  15 mL Mouth Rinse 10 times per day  . multivitamin  15 mL Per Tube Daily  . pantoprazole sodium  40 mg Per Tube BID  . QUEtiapine  50 mg Per Tube QHS  . sodium chloride flush  10-40 mL Intracatheter Q12H  . thiamine  100 mg Per Tube Daily    Continuous Infusions: . feeding  supplement (OSMOLITE 1.5 CAL) 1,000 mL (10/13/19 0447)     LOS: 35 days     Kayleen Memos, MD [facility] Pager [phone]  If 7PM-7AM, please contact night-coverage [email or web address] Password [email or web address] 10/14/2019, 12:19 PM

## 2019-10-14 NOTE — TOC Progression Note (Signed)
Transition of Care [facility]) - Progression Note    Patient Details  Name: Joshua [name] MRN: [id number] Date of Birth: [birth year]  Transition [facility] Care [facility]) CM/SW [place], [place] Phone Number: 10/14/2019, 11:56 AM  Clinical Narrative:     CSW called and spoke with Clarene Critchley with [facility]. She stated that she has received the referral and is working the patient up. She has sent the referral to her cooperate office and bed pending acceptance.   CSW will continue to follow.    Expected Discharge Plan: [place] Barriers to Discharge: Continued Medical Work up, SNF Pending payor source - LOG  Expected Discharge Plan and Services Expected Discharge Plan: [place] In-house Referral: Clinical Social Work Discharge Planning Services: NA Post Acute Care Choice: [place] Living arrangements for the past 2 months: Apartment                 DME Arranged: N/A DME Agency: NA       HH Arranged: NA HH Agency: NA         Social Determinants of Health (SDOH) Interventions    Readmission Risk Interventions No flowsheet data found.

## 2019-10-14 NOTE — Progress Notes (Signed)
RN went to pt's room to speak with family to address rings and other jewelry brought to pt room. Pt family was informed of the hospital policy and the importance of taking pt jewelry back home or it will be locked up with security for safety; RN also addressed the policy on visitors for pt; pt family voices understanding to the policies; RN Jami updated. Delia Heady RN

## 2019-10-14 NOTE — Progress Notes (Signed)
RN went into room for report and patient's family had brought in several pieces of gold colored jewelry (necklace with a medallion and several rings.)  Charge went to speak with family that we are not responsible for these belongings if they are lost or stolen while he is here.  Charge also speaking to family about multiple visitors being present in the room.

## 2019-10-15 LAB — GLUCOSE, CAPILLARY
Glucose-Capillary: 108 mg/dL — ABNORMAL HIGH (ref 70–99)
Glucose-Capillary: 113 mg/dL — ABNORMAL HIGH (ref 70–99)
Glucose-Capillary: 131 mg/dL — ABNORMAL HIGH (ref 70–99)
Glucose-Capillary: 120 mg/dL — ABNORMAL HIGH (ref 70–99)
Glucose-Capillary: 121 mg/dL — ABNORMAL HIGH (ref 70–99)

## 2019-10-15 NOTE — Progress Notes (Signed)
PROGRESS NOTE  Tommie Bohlken [id number] DOB: [birth year] DOA: 09/09/2019 PCP: Patient, [name]  HPI/Recap of past 24 hours: Mr. Wildes is a 38 y.o. M with no significant PMHx but apparently untreated severe HTN who presented with seizure-like activity, right-sided hemiparesis.  In the ER, Cr 2.29, K 2.4, CT head showed a large intraparenchymal hematoma and he was intubated.  Neurosurgery admitted the patient for emergent decompression.  10/15/19: Patient was seen and examined at his bedside this morning.  No acute events overnight.  He is alert but does not follow commands and does not track with his eyes.  Vital signs and latest labs reviewed and are stable.  Awaiting placement to SNF.  CSW assisting.     Assessment/Plan: Active Problems:   ICH (intracerebral hemorrhage) (HCC)   Subdural hematoma (HCC)   Intracerebral hemorrhage (HCC)   Cytotoxic brain edema (HCC)   Hemorrhagic stroke (HCC)   Acute respiratory failure (HCC)   Essential hypertension   Severe protein-calorie malnutrition (HCC)   CKD (chronic kidney disease), stage IV (HCC)   Seizure (HCC)  Large left intraparenchymal hemorrhage status post decompressive craniectomy with hematoma evacuation on 09/09/19 by Dr. Vertell Limber Aphasic PTOT recs SNF Speech recs NPO  Seizure disorder likely secondary to intracranial hemorrhage No recent seizure activity Continue Keppra  Aspiration pneumonia Resolved Completed 7 days of abx (zosyn>> unasyn) O2 saturation 100% on room air  Transient uncontrolled hypertension, unclear etiology Continue to monitor vital signs Continue home antihypertensive Currently on amlodipine 10 mg daily, HCTZ 13 mg daily per tube, 200 mg labetalol per tube 3 times daily Hold HCTZ due to recent AKI likely prerenal to avoid dehydration  Klebsiella oxytoca UTI Completed abx  AKI on CKD 3, likely prerenal in the setting of dehydration  Baseline cr 1.76 with GFR 55, at his baseline creatinine  Creatinine 1.84 on 10/12/2019 Urine output 1.5 L recorded in the last 24 hours Avoid nephrotoxins Continue to hold off HCTZ Continue free water flushes to avoid dehydration  Severe physical debility/ambulatory dysfunction PT OT rec SNF CSW assisting with placement   DVT prophylaxis: Lovenox sq daily Code Status: FULL Family Communication: Mother at bedside    Consultants:   Neurosurgery  Procedures:   9/12 intubation  9/12 craniotomy  9/18 extubation  9/18 re-intubation  9/23 extubation  10/1 EGD PEG placement    Antimicrobials:   Vancomycin 9/14 >> 9/15   Zosyn --> Unasyn 9/14 >> 9/20  Cefazolin --> Cephalexin 9/28 >> 10/2     Objective: Vitals:   10/14/19 2311 10/15/19 0313 10/15/19 0434 10/15/19 0748  BP: 119/89 102/65  (!) 122/95  Pulse: 82 71  81  Resp: '18 18  18  ' Temp: 97.9 F (36.6 C) 97.8 F (36.6 C)  98.4 F (36.9 C)  TempSrc: Oral Oral  Oral  SpO2: 100% 100%  100%  Weight:   56.8 kg   Height:        Intake/Output Summary (Last 24 hours) at 10/15/2019 1134 Last data filed at 10/15/2019 0749 Gross per 24 hour  Intake -  Output 1600 ml  Net -1600 ml   Filed Weights   10/13/19 0100 10/14/19 0401 10/15/19 0434  Weight: 55 kg 56.2 kg 56.8 kg    Exam:  . General: 38 y.o. year-old male frail-appearing no acute distress.  Alert does not follow command and does not track with his eyes. . Cardiovascular: Regular rate and rhythm no rubs or gallops no JVD or thyromegaly. [name] Respiratory: Clear to  auscultation no wheezing or rales.  Poor inspiratory effort.   . Abdomen: Nontender nondistended normal bowel sounds present.  PEG tube in place.   . Musculoskeletal: No lower extremity edema.  2/4 pulses in all 4 extremities.   [name] Psychiatry: Mood is appropriate for condition and setting.   Data Reviewed: CBC: No results for input(s): WBC, NEUTROABS, HGB, HCT, MCV, PLT in the last 168 hours. Basic Metabolic Panel: Recent Labs  Lab  10/10/19 0402 10/12/19 0626  NA 133*  --   K 4.1  --   CL 91*  --   CO2 27  --   GLUCOSE 102*  --   BUN 50*  --   CREATININE 1.76* 1.84*  CALCIUM 10.0  --    GFR: Estimated Creatinine Clearance: 43.3 mL/min (A) (by C-G formula based on SCr of 1.84 mg/dL (H)). Liver Function Tests: No results for input(s): AST, ALT, ALKPHOS, BILITOT, PROT, ALBUMIN in the last 168 hours. No results for input(s): LIPASE, AMYLASE in the last 168 hours. No results for input(s): AMMONIA in the last 168 hours. Coagulation Profile: No results for input(s): INR, PROTIME in the last 168 hours. Cardiac Enzymes: No results for input(s): CKTOTAL, CKMB, CKMBINDEX, TROPONINI in the last 168 hours. BNP (last 3 results) No results for input(s): PROBNP in the last 8760 hours. HbA1C: No results for input(s): HGBA1C in the last 72 hours. CBG: Recent Labs  Lab 10/14/19 1641 10/14/19 2049 10/14/19 2309 10/15/19 0310 10/15/19 0742  GLUCAP 110* 127* 102* 108* 113*   Lipid Profile: No results for input(s): CHOL, HDL, LDLCALC, TRIG, CHOLHDL, LDLDIRECT in the last 72 hours. Thyroid Function Tests: No results for input(s): TSH, T4TOTAL, FREET4, T3FREE, THYROIDAB in the last 72 hours. Anemia Panel: No results for input(s): VITAMINB12, FOLATE, FERRITIN, TIBC, IRON, RETICCTPCT in the last 72 hours. Urine analysis:    Component Value Date/Time   COLORURINE YELLOW (A) 09/23/2019 1108   APPEARANCEUR CLEAR (A) 09/23/2019 1108   LABSPEC 1.015 09/23/2019 1108   PHURINE 7.5 09/23/2019 1108   GLUCOSEU NEGATIVE 09/23/2019 1108   HGBUR NEGATIVE 09/23/2019 1108   [place] 09/23/2019 1108   [place] 09/23/2019 1108   PROTEINUR NEGATIVE 09/23/2019 1108   UROBILINOGEN 1.0 04/05/2009 0021   NITRITE NEGATIVE 09/23/2019 1108   LEUKOCYTESUR NEGATIVE 09/23/2019 1108   Sepsis Labs: '@LABRCNTIP' (procalcitonin:4,lacticidven:4)  )No results found for this or any previous visit (from the past 240 hour(s)).     Studies: No results found.  Scheduled Meds: . alteplase  2 mg Intracatheter Once  . amLODipine  10 mg Per Tube Daily  . chlorhexidine gluconate (MEDLINE KIT)  15 mL Mouth Rinse BID  . enoxaparin (LOVENOX) injection  40 mg Subcutaneous Daily  . folic acid  1 mg Per Tube Daily  . free water  250 mL Per Tube Q4H  . insulin aspart  0-15 Units Subcutaneous Q4H  . labetalol  300 mg Per Tube TID  . levETIRAcetam  500 mg Per Tube BID  . lipase/protease/amylase)  20,880 Units Per Tube Once   And  . sodium bicarbonate  650 mg Per Tube Once  . mouth rinse  15 mL Mouth Rinse 10 times per day  . multivitamin  15 mL Per Tube Daily  . pantoprazole sodium  40 mg Per Tube BID  . QUEtiapine  50 mg Per Tube QHS  . sodium chloride flush  10-40 mL Intracatheter Q12H  . thiamine  100 mg Per Tube Daily    Continuous Infusions: .  feeding supplement (OSMOLITE 1.5 CAL) 1,000 mL (10/15/19 0431)     LOS: 36 days     Kayleen Memos, MD [facility] Pager [phone]  If 7PM-7AM, please contact night-coverage [email or web address] Password [email or web address] 10/15/2019, 11:34 AM

## 2019-10-16 LAB — GLUCOSE, CAPILLARY
Glucose-Capillary: 100 mg/dL — ABNORMAL HIGH (ref 70–99)
Glucose-Capillary: 107 mg/dL — ABNORMAL HIGH (ref 70–99)
Glucose-Capillary: 121 mg/dL — ABNORMAL HIGH (ref 70–99)
Glucose-Capillary: 127 mg/dL — ABNORMAL HIGH (ref 70–99)
Glucose-Capillary: 138 mg/dL — ABNORMAL HIGH (ref 70–99)
Glucose-Capillary: 138 mg/dL — ABNORMAL HIGH (ref 70–99)
Glucose-Capillary: 98 mg/dL (ref 70–99)

## 2019-10-16 NOTE — Progress Notes (Signed)
  Speech Language Pathology Treatment: Dysphagia;Cognitive-Linquistic  Patient Details Name: Joshua [name] MRN: [id number] DOB: 11-17-1981 Today's Date: 10/16/2019 Time: [id number] SLP Time Calculation (min) (ACUTE ONLY): 25 min  Assessment / Plan / Recommendation Clinical Impression  Pt seen at bedside for skilled ST intervention targeting goals for po intake, communication, and attention. Pt resting in bed with eyes closed upon arrival of SLP. Pt required multimodal cues to open his eyes and cooperate with oral care. Girlfriend Lollie Marrow arrived and assisted with oral care. Pt is resistant to opening his mouth and having lingual surface and inner surfaces of teeth cleaned. Pt was given small boluses of applesauce (1/4-1/2tsp), and required max multimodal cues to accept a few very small boluses. Reflexive swallow noted x1, however, when oral cavity was suctioned after trials, applesauce was removed from his mouth, likely all that was given to him. Pt was nonvocal throughout this session. He does not follow commands, or make any attempt to communicate. SLP will continue to follow acutely. Continued ST intervention is recommended at next venue, as well as 24 hour supervision/assist.   HPI HPI: Pt is a 38 year old male who presented with new seizure, and was found to have a large left BG intracranial hemorrhage s/p L frontal temporal craniectomy with evacuation 9/12. ETT 9/12-9/18; 9/18-9/23. PMH: HTN, alcoholism, noncompliance      SLP Plan  Continue with current plan of care       Recommendations  Diet recommendations: NPO Medication Administration: Via alternative means                Oral Care Recommendations: Oral care QID Follow up Recommendations: Skilled Nursing facility;24 hour supervision/assistance SLP Visit Diagnosis: Dysphagia, unspecified (R13.10);Cognitive communication deficit (R41.841) Plan: Continue with current plan of care       Farley.  [name], [facility], [place] Speech Language Pathologist Office: [phone] Pager: [phone]  [name] 10/16/2019, 3:24 PM

## 2019-10-16 NOTE — Progress Notes (Signed)
PROGRESS NOTE  Joshua [name] [id number] DOB: June 26, 1981 DOA: 09/09/2019 PCP: Patient, [name]  HPI/Recap of past 24 hours: Joshua [name] is a 38 y.o. M with no significant PMHx but apparently untreated severe HTN who presented with seizure-like activity, right-sided hemiparesis.  In the ER, Cr 2.29, K 2.4, CT head showed a large intraparenchymal hematoma and he was intubated.  Neurosurgery admitted the patient for emergent decompression.  10/16/19: Seen and examined at his bedside. No acute events. Alert but does not track with his eyes. Awaiting placement. CSW assisting.     Assessment/Plan: Active Problems:   ICH (intracerebral hemorrhage) (HCC)   Subdural hematoma (HCC)   Intracerebral hemorrhage (HCC)   Cytotoxic brain edema (HCC)   Hemorrhagic stroke (HCC)   Acute respiratory failure (HCC)   Essential hypertension   Severe protein-calorie malnutrition (HCC)   CKD (chronic kidney disease), stage IV (HCC)   Seizure (HCC)  Large left intraparenchymal hemorrhage status post decompressive craniectomy with hematoma evacuation on 09/09/19 by Joshua [name] Aphasic PTOT recs SNF Speech recs NPO  Seizure disorder likely secondary to intracranial hemorrhage No recent seizure activity Continue Keppra  Aspiration pneumonia Resolved Completed 7 days of abx (zosyn>> unasyn) O2 saturation 100% on room air  Transient uncontrolled hypertension, unclear etiology Continue to monitor vital signs Continue home antihypertensive Currently on amlodipine 10 mg daily, HCTZ 13 mg daily per tube, 200 mg labetalol per tube 3 times daily Hold HCTZ due to recent AKI likely prerenal to avoid dehydration  Klebsiella oxytoca UTI Completed abx  AKI on CKD 3, likely prerenal in the setting of dehydration  Baseline cr 1.76 with GFR 55, at his baseline creatinine Creatinine 1.84 on 10/12/2019 Urine output 3.2 L recorded in the last 24 hours Avoid nephrotoxins Continue to hold off HCTZ  Continue free water flushes to avoid dehydration  Severe physical debility/ambulatory dysfunction PT OT rec SNF CSW assisting with placement   DVT prophylaxis: Lovenox sq daily Code Status: FULL Family Communication: Mother at bedside    Consultants:   Neurosurgery  Procedures:   9/12 intubation  9/12 craniotomy  9/18 extubation  9/18 re-intubation  9/23 extubation  10/1 EGD PEG placement    Antimicrobials:   Vancomycin 9/14 >> 9/15   Zosyn --> Unasyn 9/14 >> 9/20  Cefazolin --> Cephalexin 9/28 >> 10/2     Objective: Vitals:   10/15/19 2034 10/16/19 0008 10/16/19 0418 10/16/19 0453  BP: (!) 119/96 115/79 138/87   Pulse: 80 78 84   Resp: '18 20 19   ' Temp: 99.2 F (37.3 C) 98.1 F (36.7 C) 98.6 F (37 C)   TempSrc: Oral Oral    SpO2:  100% 97%   Weight:    58.5 kg  Height:        Intake/Output Summary (Last 24 hours) at 10/16/2019 1329 Last data filed at 10/16/2019 0420 Gross per 24 hour  Intake -  Output 1600 ml  Net -1600 ml   Filed Weights   10/14/19 0401 10/15/19 0434 10/16/19 0453  Weight: 56.2 kg 56.8 kg 58.5 kg    Exam:  . General: 38 y.o. year-old malefrail appearing in NAD. Alert but does not follow commands and does not track with his eyes. . Cardiovascular: RRR no rubs or gallops. [name] Respiratory:CTA no wheezes or rales . Abdomen: NT ND NBS peg tube in place . Musculoskeletal: No LE edema . Psychiatry: Mood is appropriate for condition.   Data Reviewed: CBC: No results for input(s): WBC, NEUTROABS, HGB, HCT,  MCV, PLT in the last 168 hours. Basic Metabolic Panel: Recent Labs  Lab 10/10/19 0402 10/12/19 0626  NA 133*  --   K 4.1  --   CL 91*  --   CO2 27  --   GLUCOSE 102*  --   BUN 50*  --   CREATININE 1.76* 1.84*  CALCIUM 10.0  --    GFR: Estimated Creatinine Clearance: 44.6 mL/min (A) (by C-G formula based on SCr of 1.84 mg/dL (H)). Liver Function Tests: No results for input(s): AST, ALT, ALKPHOS,  BILITOT, PROT, ALBUMIN in the last 168 hours. No results for input(s): LIPASE, AMYLASE in the last 168 hours. No results for input(s): AMMONIA in the last 168 hours. Coagulation Profile: No results for input(s): INR, PROTIME in the last 168 hours. Cardiac Enzymes: No results for input(s): CKTOTAL, CKMB, CKMBINDEX, TROPONINI in the last 168 hours. BNP (last 3 results) No results for input(s): PROBNP in the last 8760 hours. HbA1C: No results for input(s): HGBA1C in the last 72 hours. CBG: Recent Labs  Lab 10/15/19 2025 10/16/19 0007 10/16/19 0415 10/16/19 0814 10/16/19 1139  GLUCAP 121* 138* 98 138* 127*   Lipid Profile: No results for input(s): CHOL, HDL, LDLCALC, TRIG, CHOLHDL, LDLDIRECT in the last 72 hours. Thyroid Function Tests: No results for input(s): TSH, T4TOTAL, FREET4, T3FREE, THYROIDAB in the last 72 hours. Anemia Panel: No results for input(s): VITAMINB12, FOLATE, FERRITIN, TIBC, IRON, RETICCTPCT in the last 72 hours. Urine analysis:    Component Value Date/Time   COLORURINE YELLOW (A) 09/23/2019 1108   APPEARANCEUR CLEAR (A) 09/23/2019 1108   LABSPEC 1.015 09/23/2019 1108   PHURINE 7.5 09/23/2019 1108   GLUCOSEU NEGATIVE 09/23/2019 1108   HGBUR NEGATIVE 09/23/2019 1108   [place] 09/23/2019 1108   [place] 09/23/2019 1108   PROTEINUR NEGATIVE 09/23/2019 1108   UROBILINOGEN 1.0 04/05/2009 0021   NITRITE NEGATIVE 09/23/2019 1108   LEUKOCYTESUR NEGATIVE 09/23/2019 1108   Sepsis Labs: '@LABRCNTIP' (procalcitonin:4,lacticidven:4)  )No results found for this or any previous visit (from the past 240 hour(s)).    Studies: No results found.  Scheduled Meds: . alteplase  2 mg Intracatheter Once  . amLODipine  10 mg Per Tube Daily  . chlorhexidine gluconate (MEDLINE KIT)  15 mL Mouth Rinse BID  . enoxaparin (LOVENOX) injection  40 mg Subcutaneous Daily  . folic acid  1 mg Per Tube Daily  . free water  250 mL Per Tube Q4H  . insulin  aspart  0-15 Units Subcutaneous Q4H  . labetalol  300 mg Per Tube TID  . levETIRAcetam  500 mg Per Tube BID  . lipase/protease/amylase)  20,880 Units Per Tube Once   And  . sodium bicarbonate  650 mg Per Tube Once  . mouth rinse  15 mL Mouth Rinse 10 times per day  . multivitamin  15 mL Per Tube Daily  . pantoprazole sodium  40 mg Per Tube BID  . QUEtiapine  50 mg Per Tube QHS  . sodium chloride flush  10-40 mL Intracatheter Q12H  . thiamine  100 mg Per Tube Daily    Continuous Infusions: . feeding supplement (OSMOLITE 1.5 CAL) 1,000 mL (10/15/19 0431)     LOS: 37 days     Kayleen Memos, MD [facility] Pager [phone]  If 7PM-7AM, please contact night-coverage [email or web address] Password [email or web address] 10/16/2019, 1:29 PM

## 2019-10-17 LAB — CBC
HCT: 31.4 % — ABNORMAL LOW (ref 39.0–52.0)
Hemoglobin: 10.1 g/dL — ABNORMAL LOW (ref 13.0–17.0)
MCH: 28.3 pg (ref 26.0–34.0)
MCHC: 32.2 g/dL (ref 30.0–36.0)
MCV: 88 fL (ref 80.0–100.0)
Platelets: 238 10*3/uL (ref 150–400)
RBC: 3.57 MIL/uL — ABNORMAL LOW (ref 4.22–5.81)
RDW: 13 % (ref 11.5–15.5)
WBC: 5.3 10*3/uL (ref 4.0–10.5)
nRBC: 0 % (ref 0.0–0.2)

## 2019-10-17 LAB — BASIC METABOLIC PANEL
Anion gap: 11 (ref 5–15)
BUN: 35 mg/dL — ABNORMAL HIGH (ref 6–20)
CO2: 28 mmol/L (ref 22–32)
Calcium: 10.1 mg/dL (ref 8.9–10.3)
Chloride: 97 mmol/L — ABNORMAL LOW (ref 98–111)
Creatinine, Ser: 1.59 mg/dL — ABNORMAL HIGH (ref 0.61–1.24)
GFR calc Af Amer: >60 mL/min (ref >60)
GFR calc non Af Amer: 54 mL/min — ABNORMAL LOW (ref >60)
Glucose, Bld: 113 mg/dL — ABNORMAL HIGH (ref 70–99)
Potassium: 4.6 mmol/L (ref 3.5–5.1)
Sodium: 136 mmol/L (ref 135–145)

## 2019-10-17 LAB — GLUCOSE, CAPILLARY
Glucose-Capillary: 104 mg/dL — ABNORMAL HIGH (ref 70–99)
Glucose-Capillary: 115 mg/dL — ABNORMAL HIGH (ref 70–99)
Glucose-Capillary: 119 mg/dL — ABNORMAL HIGH (ref 70–99)
Glucose-Capillary: 123 mg/dL — ABNORMAL HIGH (ref 70–99)
Glucose-Capillary: 127 mg/dL — ABNORMAL HIGH (ref 70–99)
Glucose-Capillary: 96 mg/dL (ref 70–99)

## 2019-10-17 LAB — MAGNESIUM: Magnesium: 1.9 mg/dL (ref 1.7–2.4)

## 2019-10-17 LAB — PHOSPHORUS: Phosphorus: 5.3 mg/dL — ABNORMAL HIGH (ref 2.5–4.6)

## 2019-10-17 NOTE — Progress Notes (Signed)
Physical Therapy Treatment Patient Details Name: Joshua [name] MRN: [id number] DOB: 1981-11-13 Today's Date: 10/17/2019    History of Present Illness This pt is a 38 year old with history of alcohol abuse, seizures, noncompliant hypertension who presented with new seizure right-sided hemiplegia and confusion was found to have a large left intracranial hemorrhage now s/p left frontoparietal craniectomy for evacuation of left basal ganglia hematoma on 09/09/19. Skull flap in abdomen.  Intubated 9/12 (day of admission) and extubated 09/15/19.     PT Comments    Pt performed supine to sit and sit to stand all with total +2 assistance and sit to stand mechanical lift.  Even with lift equipment assistance he still required total assistance to hold head upright and to position forearms to support posture.  He did not stand long and began to flex hips and required return back to bed.  During session his brother was on the phone (facetime).  He did reach to grab the phone on three separate occasions and held the phone without assistance.  He appeared to be looking at the phone and would gaze towards his brothers voice.      Follow Up Recommendations  SNF     Equipment Recommendations  Wheelchair (measurements PT);Wheelchair cushion (measurements PT);Hospital bed;3in1 (PT)    Recommendations for Other Services Rehab consult     Precautions / Restrictions Precautions Precautions: Fall Precaution Comments: L crani, bone flap removed and in abdomen, NG, L mitt Restrictions Weight Bearing Restrictions: No    Mobility  Bed Mobility Overal bed mobility: Needs Assistance Bed Mobility: Rolling;Supine to Sit;Sit to Supine Rolling: Total assist;+2 for physical assistance   Supine to sit: Total assist;+2 for physical assistance     General bed mobility comments: Total assistance to roll and come to sitting.  In seated postion he was pushing R heavily with LUE.  He did grip when hand was placed on  rail or platform handle.  Transfers Overall transfer level: Needs assistance Equipment used: (sara +) Transfers: Sit to/from Stand Sit to Stand: Total assist;+2 physical assistance         General transfer comment: Pt required sara + to support upper trunk.  PTA supported head into extension and nursing staff to keep sara + steady during transfer.  Pt presents with bowel incontinence and nursing performed pericare with patient in standing.  Ambulation/Gait Ambulation/Gait assistance: (NT)               Stairs             Wheelchair Mobility    Modified Rankin (Stroke Patients Only)       Balance Overall balance assessment: Needs assistance Sitting-balance support: Feet supported;Single extremity supported Sitting balance-Leahy Scale: Zero       Standing balance-Leahy Scale: Zero Standing balance comment: total assistance in frame to maintain standing.                            Cognition Arousal/Alertness: Awake/alert Behavior During Therapy: Flat affect Overall Cognitive Status: Difficult to assess                                 General Comments: Pt continues to lack communication.  He was able to track where his sister's phone was when brother was speaking on face time.  He did reach to grab and held phone three different times.  Exercises      General Comments        Pertinent Vitals/Pain Pain Assessment: Faces Faces Pain Scale: No hurt    Home Living                      Prior Function            PT Goals (current goals can now be found in the care plan section) Acute Rehab PT Goals Patient Stated Goal: none stated Potential to Achieve Goals: Fair Progress towards PT goals: Progressing toward goals    Frequency    Min 2X/week      PT Plan Frequency needs to be updated    Co-evaluation              AM-PAC PT "6 Clicks" Mobility   Outcome Measure  Help needed turning from  your back to your side while in a flat bed without using bedrails?: Total Help needed moving from lying on your back to sitting on the side of a flat bed without using bedrails?: Total Help needed moving to and from a bed to a chair (including a wheelchair)?: Total Help needed standing up from a chair using your arms (e.g., wheelchair or bedside chair)?: Total Help needed to walk in hospital room?: Total Help needed climbing 3-5 steps with a railing? : Total 6 Click Score: 6    End of Session Equipment Utilized During Treatment: Gait belt Activity Tolerance: Patient limited by fatigue Patient left: in bed;with call bell/phone within reach(RN and NT to change patient post session.) Nurse Communication: Mobility status PT Visit Diagnosis: Muscle weakness (generalized) (M62.81);Difficulty in walking, not elsewhere classified (R26.2);Hemiplegia and hemiparesis Hemiplegia - Right/Left: Right Hemiplegia - dominant/non-dominant: Dominant Hemiplegia - caused by: Nontraumatic intracerebral hemorrhage     Time: [id number] PT Time Calculation (min) (ACUTE ONLY): 31 min  Charges:  $Therapeutic Activity: 23-37 mins                     Joshua [name], PTA Acute Rehabilitation Services Pager [phone] Office [phone]     Joshua [name] Joshua [name] 10/17/2019, 6:04 PM

## 2019-10-17 NOTE — Progress Notes (Signed)
PROGRESS NOTE  Joshua [name] ERX:[id number] DOB: [birth year] DOA: 09/09/2019 PCP: Patient, [name]  HPI/Recap of past 24 hours: Joshua [name] is a 38 y.o. M with no significant PMHx but apparently untreated severe HTN who presented with seizure-like activity, right-sided hemiparesis.  In the ER, Cr 2.29, K 2.4, CT head showed a large intraparenchymal hematoma and he was intubated.  Neurosurgery admitted the patient for emergent decompression.  10/17/19: Seen and examined at his bedside. He does not track and does not follow commands.  Does not appear in distress.  Awaiting placement.  CSW assisting.     Assessment/Plan: Active Problems:   ICH (intracerebral hemorrhage) (HCC)   Subdural hematoma (HCC)   Intracerebral hemorrhage (HCC)   Cytotoxic brain edema (HCC)   Hemorrhagic stroke (HCC)   Acute respiratory failure ([place])   Essential hypertension   Severe protein-calorie malnutrition ([place])   CKD (chronic kidney disease), stage IV (HCC)   Seizure ([place])  Large left intraparenchymal hemorrhage status post decompressive craniectomy with hematoma evacuation on 09/09/19 by Dr. Vertell Limber Aphasic PTOT recs SNF Speech recs NPO  Seizure disorder likely secondary to intracranial hemorrhage No recent seizure activity Continue Keppra  Aspiration pneumonia Resolved Completed 7 days of abx (zosyn>> unasyn) O2 saturation 100% on room air  Transient uncontrolled hypertension, unclear etiology Continue to monitor vital signs Continue home antihypertensive Currently on amlodipine 10 mg daily, HCTZ 13 mg daily per tube, 200 mg labetalol per tube 3 times daily Hold HCTZ due to recent AKI likely prerenal to avoid dehydration  Klebsiella oxytoca UTI Completed abx  AKI on CKD 3, likely prerenal in the setting of dehydration  Baseline cr 1.76 with GFR 55, at his baseline creatinine Creatinine 1.84 on 10/12/2019 Urine output 3.2 L recorded in the last 24 hours Avoid nephrotoxins Continue to  hold off HCTZ Continue free water flushes to avoid dehydration  Severe physical debility/ambulatory dysfunction PT OT rec SNF CSW assisting with placement   DVT prophylaxis: Lovenox sq daily Code Status: FULL Family Communication:  None at bedside    Consultants:   Neurosurgery  Procedures:   9/12 intubation  9/12 craniotomy  9/18 extubation  9/18 re-intubation  9/23 extubation  10/1 EGD PEG placement    Antimicrobials:   Vancomycin 9/14 >> 9/15   Zosyn --> Unasyn 9/14 >> 9/20  Cefazolin --> Cephalexin 9/28 >> 10/2     Objective: Vitals:   10/17/19 0404 10/17/19 0410 10/17/19 0809 10/17/19 1251  BP: 113/81  (!) 127/96 (!) 123/96  Pulse: 71  77 86  Resp: _0 Temp: (!) 97.5 F (36.4 C)  99.1 F (37.3 C) 99.2 F (37.3 C)  TempSrc: Axillary  Oral Oral  SpO2: 100%  100% 100%  Weight:  58.2 kg    Height:        Intake/Output Summary (Last 24 hours) at 10/17/2019 1410 Last data filed at 10/17/2019 1254 Gross per 24 hour  Intake 420 ml  Output 2300 ml  Net -1880 ml   Filed Weights   10/15/19 0434 10/16/19 0453 10/17/19 0410  Weight: 56.8 kg 58.5 kg 58.2 kg    Exam:  . General: 38 y.o. year-old male frail-appearing no acute distress.  Does not track and does not follow commands.  Does not appear in distress.   Cardiovascular: Regular rate and rhythm no rubs or gallops no JVD or thyromegaly. [name] Respiratory: Clear to auscultation no wheezes or rales.  Poor inspiratory effort. . Abdomen: Nontender nondistended bowel sounds  present.  PEG tube in place.   . Musculoskeletal: No lower extremity edema.  2 out of 4 pulses in all 4 extremities. . Psychiatry: Mood is appropriate for condition and setting.   Data Reviewed: CBC: Recent Labs  Lab 10/17/19 0616  WBC 5.3  HGB 10.1*  HCT 31.4*  MCV 88.0  PLT 238   Basic Metabolic Panel: Recent Labs  Lab 10/12/19 0626 10/17/19 0616  NA  --  136  K  --  4.6  CL  --  97*  CO2  --  28   GLUCOSE  --  113*  BUN  --  35*  CREATININE 1.84* 1.59*  CALCIUM  --  10.1  MG  --  1.9  PHOS  --  5.3*   GFR: Estimated Creatinine Clearance: 51.3 mL/min (A) (by C-G formula based on SCr of 1.59 mg/dL (H)). Liver Function Tests: No results for input(s): AST, ALT, ALKPHOS, BILITOT, PROT, ALBUMIN in the last 168 hours. No results for input(s): LIPASE, AMYLASE in the last 168 hours. No results for input(s): AMMONIA in the last 168 hours. Coagulation Profile: No results for input(s): INR, PROTIME in the last 168 hours. Cardiac Enzymes: No results for input(s): CKTOTAL, CKMB, CKMBINDEX, TROPONINI in the last 168 hours. BNP (last 3 results) No results for input(s): PROBNP in the last 8760 hours. HbA1C: No results for input(s): HGBA1C in the last 72 hours. CBG: Recent Labs  Lab 10/16/19 2042 10/16/19 2334 10/17/19 0409 10/17/19 0812 10/17/19 1246  GLUCAP 107* 121* 127* 104* 119*   Lipid Profile: No results for input(s): CHOL, HDL, LDLCALC, TRIG, CHOLHDL, LDLDIRECT in the last 72 hours. Thyroid Function Tests: No results for input(s): TSH, T4TOTAL, FREET4, T3FREE, THYROIDAB in the last 72 hours. Anemia Panel: No results for input(s): VITAMINB12, FOLATE, FERRITIN, TIBC, IRON, RETICCTPCT in the last 72 hours. Urine analysis:    Component Value Date/Time   COLORURINE YELLOW (A) 09/23/2019 1108   APPEARANCEUR CLEAR (A) 09/23/2019 1108   LABSPEC 1.015 09/23/2019 1108   PHURINE 7.5 09/23/2019 1108   GLUCOSEU NEGATIVE 09/23/2019 1108   HGBUR NEGATIVE 09/23/2019 1108   BILIRUBINUR NEGATIVE 09/23/2019 1108   KETONESUR NEGATIVE 09/23/2019 1108   PROTEINUR NEGATIVE 09/23/2019 1108   UROBILINOGEN 1.0 04/05/2009 0021   NITRITE NEGATIVE 09/23/2019 1108   LEUKOCYTESUR NEGATIVE 09/23/2019 1108   Sepsis Labs: @LABRCNTIP(procalcitonin:4,lacticidven:4)  )No results found for this or any previous visit (from the past 240 hour(s)).    Studies: No results found.  Scheduled Meds: .  alteplase  2 mg Intracatheter Once  . amLODipine  10 mg Per Tube Daily  . chlorhexidine gluconate (MEDLINE KIT)  15 mL Mouth Rinse BID  . enoxaparin (LOVENOX) injection  40 mg Subcutaneous Daily  . folic acid  1 mg Per Tube Daily  . free water  250 mL Per Tube Q4H  . insulin aspart  0-15 Units Subcutaneous Q4H  . labetalol  300 mg Per Tube TID  . levETIRAcetam  500 mg Per Tube BID  . lipase/protease/amylase)  20,880 Units Per Tube Once   And  . sodium bicarbonate  650 mg Per Tube Once  . mouth rinse  15 mL Mouth Rinse 10 times per day  . multivitamin  15 mL Per Tube Daily  . pantoprazole sodium  40 mg Per Tube BID  . QUEtiapine  50 mg Per Tube QHS  . sodium chloride flush  10-40 mL Intracatheter Q12H  . thiamine  100 mg Per Tube Daily      Continuous Infusions: . feeding supplement (OSMOLITE 1.5 CAL) 1,000 mL (10/17/19 0459)     LOS: 38 days     Kayleen Memos, MD [facility] Pager [phone]  If 7PM-7AM, please contact night-coverage [email or web address] Password [email or web address] 10/17/2019, 2:10 PM

## 2019-10-17 NOTE — Progress Notes (Signed)
Nutrition Follow-up  DOCUMENTATION CODES:   Not applicable  INTERVENTION:  Continue Osmolite 1.5 formula via PEG at goal rate of 70 ml/hr.  Free water flushes of 250 ml every 4 hours per MD.   Tube feeding to provide 2520 kcal, 105 grams of protein, and 2777 ml free water.  NUTRITION DIAGNOSIS:   Inadequate oral intake related to inability to eat as evidenced by NPO status; ongoing  GOAL:   Patient will meet greater than or equal to 90% of their needs; met with TF  MONITOR:   TF tolerance, Weight trends  REASON FOR ASSESSMENT:   Ventilator, Consult Enteral/tube feeding initiation and management  ASSESSMENT:   38 yo male admitted with large left intracranial hemorrhage; S/P craniotomy. PMH includes HTN, alcoholism, noncompliance. 9/12left frontal temporal craniectomy and evacuation hematoma. 9/23 Extubated. 10/1 PEG placed.   Pt no following commands per MD. Pt tolerating his tube feedings well with no difficulties. RD to continue with current orders and monitor for continued tolerance. Awaiting placement. Labs and medications reviewed.    Diet Order:   Diet Order            Diet NPO time specified  Diet effective midnight              EDUCATION NEEDS:   Not appropriate for education at this time  Skin:  Skin Assessment: Reviewed RN Assessment  Last BM:  10/15  Height:   Ht Readings from Last 1 Encounters:  09/09/19 '5\' 9"'  (1.753 m)    Weight:   Wt Readings from Last 1 Encounters:  10/17/19 58.2 kg    Ideal Body Weight:  72.7 kg  BMI:  Body mass index is 18.95 kg/m.  Estimated Nutritional Needs:   Kcal:  2100-2400  Protein:  105-125 grams  Fluid:  >/= 2 L/day    Joshua Parker, MS, RD, LDN Pager # [phone] After hours/ weekend pager # [phone]

## 2019-10-18 LAB — GLUCOSE, CAPILLARY
Glucose-Capillary: 116 mg/dL — ABNORMAL HIGH (ref 70–99)
Glucose-Capillary: 113 mg/dL — ABNORMAL HIGH (ref 70–99)
Glucose-Capillary: 106 mg/dL — ABNORMAL HIGH (ref 70–99)
Glucose-Capillary: 117 mg/dL — ABNORMAL HIGH (ref 70–99)
Glucose-Capillary: 99 mg/dL (ref 70–99)
Glucose-Capillary: 103 mg/dL — ABNORMAL HIGH (ref 70–99)

## 2019-10-18 NOTE — Progress Notes (Signed)
PROGRESS NOTE  Joshua [name] BRA:[id number] DOB: September 18, 1981 DOA: 09/09/2019 PCP: Patient, [name]  HPI/Recap of past 24 hours: Joshua [name] is a 38 y.o. M with no significant PMHx but apparently untreated severe HTN who presented with seizure-like activity, right-sided hemiparesis.  In the ER, Cr 2.29, K 2.4, CT head showed a large intraparenchymal hematoma and he was intubated.  Neurosurgery admitted the patient for emergent decompression.  10/18/19: Seen and examined at bedside. No changes. Does not track and does not follow commands. Awaiting placement. Labs obtained, reviewed and are stable. Vital signs also reviewed and are stable.      Assessment/Plan: Active Problems:   ICH (intracerebral hemorrhage) (HCC)   Subdural hematoma (HCC)   Intracerebral hemorrhage (HCC)   Cytotoxic brain edema (HCC)   Hemorrhagic stroke (HCC)   Acute respiratory failure ([place])   Essential hypertension   Severe protein-calorie malnutrition ([place])   CKD (chronic kidney disease), stage IV (HCC)   Seizure ([place])  Large left intraparenchymal hemorrhage status post decompressive craniectomy with hematoma evacuation on 09/09/19 by Dr. Vertell Limber Aphasic PTOT recs SNF Speech recs NPO  Seizure disorder likely secondary to intracranial hemorrhage No recent seizure activity Continue Keppra  Aspiration pneumonia Resolved Completed 7 days of abx (zosyn>> unasyn) O2 saturation 100% on room air Labs and vital signs reviewed and are stable  Transient uncontrolled hypertension, unclear etiology BP is at goal Continue to monitor vital signs Continue home antihypertensive Currently on amlodipine 10 mg daily, HCTZ 13 mg daily per tube, 200 mg labetalol per tube 3 times daily Hold HCTZ due to recent AKI likely prerenal to avoid dehydration  Klebsiella oxytoca UTI Completed abx  Resolving AKI on CKD 3, likely prerenal in the setting of dehydration  Baseline cr 1.76 with GFR 55, at his baseline creatinine  Creatinine 1.84 on 10/12/2019 Cr 1.59 with GFR >60 on 10/17/19. Urine output 1.9 L recorded in the last 24 hours C/w Avoid nephrotoxins Continue to hold off HCTZ Continue free water flushes to avoid dehydration  Severe physical debility/ambulatory dysfunction PT OT rec SNF CSW assisting with placement   DVT prophylaxis: Lovenox sq daily Code Status: FULL Family Communication:  None at bedside    Consultants:   Neurosurgery  Procedures:   9/12 intubation  9/12 craniotomy  9/18 extubation  9/18 re-intubation  9/23 extubation  10/1 EGD PEG placement    Antimicrobials:   Vancomycin 9/14 >> 9/15   Zosyn --> Unasyn 9/14 >> 9/20  Cefazolin --> Cephalexin 9/28 >> 10/2     Objective: Vitals:   10/18/19 0331 10/18/19 0856 10/18/19 1206 10/18/19 1207  BP: 107/81 (!) 128/101 (!) 118/91 121/87  Pulse: 79 76 93 79  Resp: '18 16 16   ' Temp: 98.3 F (36.8 C) 98.6 F (37 C) 99 F (37.2 C)   TempSrc: Axillary Oral Oral   SpO2: 100% 100% 100% 100%  Weight: 59.3 kg     Height:        Intake/Output Summary (Last 24 hours) at 10/18/2019 1249 Last data filed at 10/18/2019 0331 Gross per 24 hour  Intake -  Output 1900 ml  Net -1900 ml   Filed Weights   10/16/19 0453 10/17/19 0410 10/18/19 0331  Weight: 58.5 kg 58.2 kg 59.3 kg    Exam:  . General: 38 y.o. year-old male Frail NAD Alert but does not track with eyes and does not follow commands . Cardiovascular: Regular rate and rhythm no rubs or gallops.  Respiratory: Clear to auscultation no  wheezes or rales.   . Abdomen: Nontender nondistended bowel sounds present.  PEG tube in place.   . Musculoskeletal: No lower extremity edema.  2 out of 4 pulses.     Data Reviewed: CBC: Recent Labs  Lab 10/17/19 0616  WBC 5.3  HGB 10.1*  HCT 31.4*  MCV 88.0  PLT 882   Basic Metabolic Panel: Recent Labs  Lab 10/12/19 0626 10/17/19 0616  NA  --  136  K  --  4.6  CL  --  97*  CO2  --  28  GLUCOSE   --  113*  BUN  --  35*  CREATININE 1.84* 1.59*  CALCIUM  --  10.1  MG  --  1.9  PHOS  --  5.3*   GFR: Estimated Creatinine Clearance: 52.3 mL/min (A) (by C-G formula based on SCr of 1.59 mg/dL (H)). Liver Function Tests: No results for input(s): AST, ALT, ALKPHOS, BILITOT, PROT, ALBUMIN in the last 168 hours. No results for input(s): LIPASE, AMYLASE in the last 168 hours. No results for input(s): AMMONIA in the last 168 hours. Coagulation Profile: No results for input(s): INR, PROTIME in the last 168 hours. Cardiac Enzymes: No results for input(s): CKTOTAL, CKMB, CKMBINDEX, TROPONINI in the last 168 hours. BNP (last 3 results) No results for input(s): PROBNP in the last 8760 hours. HbA1C: No results for input(s): HGBA1C in the last 72 hours. CBG: Recent Labs  Lab 10/17/19 1933 10/17/19 2339 10/18/19 0352 10/18/19 0820 10/18/19 1202  GLUCAP 123* 115* 116* 113* 106*   Lipid Profile: No results for input(s): CHOL, HDL, LDLCALC, TRIG, CHOLHDL, LDLDIRECT in the last 72 hours. Thyroid Function Tests: No results for input(s): TSH, T4TOTAL, FREET4, T3FREE, THYROIDAB in the last 72 hours. Anemia Panel: No results for input(s): VITAMINB12, FOLATE, FERRITIN, TIBC, IRON, RETICCTPCT in the last 72 hours. Urine analysis:    Component Value Date/Time   COLORURINE YELLOW (A) 09/23/2019 1108   APPEARANCEUR CLEAR (A) 09/23/2019 1108   LABSPEC 1.015 09/23/2019 1108   PHURINE 7.5 09/23/2019 1108   GLUCOSEU NEGATIVE 09/23/2019 1108   HGBUR NEGATIVE 09/23/2019 1108   [place] 09/23/2019 1108   [place] 09/23/2019 1108   PROTEINUR NEGATIVE 09/23/2019 1108   UROBILINOGEN 1.0 04/05/2009 0021   NITRITE NEGATIVE 09/23/2019 1108   LEUKOCYTESUR NEGATIVE 09/23/2019 1108   Sepsis Labs: '@LABRCNTIP' (procalcitonin:4,lacticidven:4)  )No results found for this or any previous visit (from the past 240 hour(s)).    Studies: No results found.  Scheduled Meds: . alteplase   2 mg Intracatheter Once  . amLODipine  10 mg Per Tube Daily  . chlorhexidine gluconate (MEDLINE KIT)  15 mL Mouth Rinse BID  . enoxaparin (LOVENOX) injection  40 mg Subcutaneous Daily  . folic acid  1 mg Per Tube Daily  . free water  250 mL Per Tube Q4H  . insulin aspart  0-15 Units Subcutaneous Q4H  . labetalol  300 mg Per Tube TID  . levETIRAcetam  500 mg Per Tube BID  . lipase/protease/amylase)  20,880 Units Per Tube Once   And  . sodium bicarbonate  650 mg Per Tube Once  . mouth rinse  15 mL Mouth Rinse 10 times per day  . multivitamin  15 mL Per Tube Daily  . pantoprazole sodium  40 mg Per Tube BID  . QUEtiapine  50 mg Per Tube QHS  . sodium chloride flush  10-40 mL Intracatheter Q12H  . thiamine  100 mg Per Tube Daily  Continuous Infusions: . feeding supplement (OSMOLITE 1.5 CAL) 1,000 mL (10/18/19 0004)     LOS: 16 days     Kayleen Memos, MD [facility] Pager [phone]  If 7PM-7AM, please contact night-coverage [email or web address] Password [facility] 10/18/2019, 12:49 PM

## 2019-10-19 LAB — GLUCOSE, CAPILLARY
Glucose-Capillary: 113 mg/dL — ABNORMAL HIGH (ref 70–99)
Glucose-Capillary: 97 mg/dL (ref 70–99)
Glucose-Capillary: 100 mg/dL — ABNORMAL HIGH (ref 70–99)
Glucose-Capillary: 89 mg/dL (ref 70–99)
Glucose-Capillary: 114 mg/dL — ABNORMAL HIGH (ref 70–99)

## 2019-10-19 LAB — CREATININE, SERUM
Creatinine, Ser: 1.56 mg/dL — ABNORMAL HIGH (ref 0.61–1.24)
GFR calc Af Amer: >60 mL/min (ref >60)
GFR calc non Af Amer: 55 mL/min — ABNORMAL LOW (ref >60)

## 2019-10-19 NOTE — Progress Notes (Signed)
  Speech Language Pathology Treatment: Dysphagia;Cognitive-Linquistic  Patient Details Name: Joshua [name] MRN: [id number] DOB: [birth year] Today's Date: 10/19/2019 Time: 1310-1330 SLP Time Calculation (min) (ACUTE ONLY): 20 min  Assessment / Plan / Recommendation Clinical Impression  Pt seen for skilled ST intervention targeting goals for po readiness, attention to the right, communication of wants/needs, and direction following. Pt's girlfriend Joshua [name] was present again today, and was appropriately interactive during this session. RN was completing oral care upon arrival of SLP. SLP and Joshua [name] attempted to provide boluses of ice cream and applesauce, but pt pushed [place] both away and would not accept either consistency. Pt continues to be aphonic, and was unable to demonstrate ability to follow verbal commands, even given max multimodal cues. Pt progress in treatment is limited by poor cooperation and lack of participation. Will continue efforts.    HPI HPI: Pt is a 38 year old male who presented with new seizure, and was found to have a large left BG intracranial hemorrhage s/p L frontal temporal craniectomy with evacuation 9/12. ETT 9/12-9/18; 9/18-9/23. PMH: HTN, alcoholism, noncompliance      SLP Plan  Continue with current plan of care       Recommendations  Diet recommendations: NPO Medication Administration: Via alternative means                Oral Care Recommendations: Oral care QID Follow up Recommendations: Skilled Nursing facility;24 hour supervision/assistance SLP Visit Diagnosis: Dysphagia, unspecified (R13.10);Cognitive communication deficit (R41.841) Plan: Continue with current plan of care       [place]. [name], [facility], [place] Speech Language Pathologist Office: [phone] Pager: [phone]  [name] 10/19/2019, 1:32 PM

## 2019-10-19 NOTE — Progress Notes (Signed)
10/19/2019 PT saw pt in conjunction with PTA today.  No charge from PT.  See PTA notes for details.  PT did update goals.  See care plan for details.   Thanks,  Barbarann Ehlers. Jaron Czarnecki, PT, DPT  Acute Rehabilitation [phone] pager [phone]) [phone] office  @ [place]: [phone]

## 2019-10-19 NOTE — Plan of Care (Signed)
  Problem: Education: Goal: Knowledge of General Education information will improve Description: Including pain rating scale, medication(s)/side effects and non-pharmacologic comfort measures Outcome: Progressing   Problem: Health Behavior/Discharge Planning: Goal: Ability to manage health-related needs will improve Outcome: Progressing   Problem: Clinical Measurements: Goal: Ability to maintain clinical measurements within normal limits will improve Outcome: Progressing Goal: Will remain free from infection Outcome: Progressing Goal: Diagnostic test results will improve Outcome: Progressing Goal: Respiratory complications will improve Outcome: Progressing Goal: Cardiovascular complication will be avoided Outcome: Progressing   Problem: Activity: Goal: Risk for activity intolerance will decrease Outcome: Progressing   Problem: Nutrition: Goal: Adequate nutrition will be maintained Outcome: Progressing   Problem: Coping: Goal: Level of anxiety will decrease Outcome: Progressing   Problem: Elimination: Goal: Will not experience complications related to bowel motility Outcome: Progressing Goal: Will not experience complications related to urinary retention Outcome: Progressing   Problem: Pain Managment: Goal: General experience of comfort will improve Outcome: Progressing   Problem: Safety: Goal: Ability to remain free from injury will improve Outcome: Progressing   Problem: Skin Integrity: Goal: Risk for impaired skin integrity will decrease Outcome: Progressing   Problem: Education: Goal: Knowledge of the prescribed therapeutic regimen will improve Outcome: Progressing   Problem: Clinical Measurements: Goal: Usual level of consciousness will be regained or maintained. Outcome: Progressing Goal: Neurologic status will improve Outcome: Progressing Goal: Ability to maintain intracranial pressure will improve Outcome: Progressing   Problem: Skin  Integrity: Goal: Demonstration of wound healing without infection will improve Outcome: Progressing   Problem: Education: Goal: Knowledge of disease or condition will improve Outcome: Progressing Goal: Knowledge of secondary prevention will improve Outcome: Progressing Goal: Knowledge of patient specific risk factors addressed and post discharge goals established will improve Outcome: Progressing Goal: Individualized Educational Video(s) Outcome: Progressing   Problem: Coping: Goal: Will verbalize positive feelings about self Outcome: Progressing Goal: Will identify appropriate support needs Outcome: Progressing   Problem: Health Behavior/Discharge Planning: Goal: Ability to manage health-related needs will improve Outcome: Progressing   Problem: Self-Care: Goal: Ability to participate in self-care as condition permits will improve Outcome: Progressing Goal: Ability to communicate needs accurately will improve Outcome: Progressing   Problem: Nutrition: Goal: Risk of aspiration will decrease Outcome: Progressing Goal: Dietary intake will improve Outcome: Progressing   Problem: Intracerebral Hemorrhage Tissue Perfusion: Goal: Complications of Intracerebral Hemorrhage will be minimized Outcome: Progressing   Ival Bible, BSN, RN

## 2019-10-19 NOTE — Progress Notes (Signed)
Physical Therapy Treatment Patient Details Name: Joshua [name] MRN: [id number] DOB: 04-Oct-1981 Today's Date: 10/19/2019    History of Present Illness This pt is a 38 year old with history of alcohol abuse, seizures, noncompliant hypertension who presented with new seizure right-sided hemiplegia and confusion was found to have a large left intracranial hemorrhage now s/p left frontoparietal craniectomy for evacuation of left basal ganglia hematoma on 09/09/19. Skull flap in abdomen.  Intubated 9/12 (day of admission) and extubated 09/15/19.     PT Comments    Pt supine in bed on arrival.  He continues to respond to reach and grab cell phone and attempts to scroll.  He is also following commands for squeezing PTAs hand with his L hand.  Based on his improved ability to purposefully interact, we transferred him to recliner chair with propping for R sided lean and to support his neck.  Plan continues for SNF placement at d/c.    Follow Up Recommendations  SNF     Equipment Recommendations  Wheelchair (measurements PT);Wheelchair cushion (measurements PT);Hospital bed;3in1 (PT)    Recommendations for Other Services Rehab consult     Precautions / Restrictions Precautions Precautions: Fall Precaution Comments: L crani, bone flap removed and in abdomen, NG, L mitt Restrictions Weight Bearing Restrictions: No    Mobility  Bed Mobility Overal bed mobility: Needs Assistance Bed Mobility: Rolling;Sidelying to Sit Rolling: +2 for physical assistance;Total assist Sidelying to sit: Total assist;+2 for physical assistance       General bed mobility comments: Pt continues to required total assistance.  Attempted Hand over Hand placement to attempt to hold rail.  He pushes strong to the R side.  He required use of bed pad to scoot to edge of bed.  Transfers Overall transfer level: Needs assistance Equipment used: None Transfers: Squat Pivot Transfers     Squat pivot transfers: Total  assist;+2 physical assistance     General transfer comment: Pt performed bed to chair transfer with total +2 assistance.  Once in recliner, propped and positioned with towel rolls and pillow.  Pt resting with feet in dependent position.  Ambulation/Gait Ambulation/Gait assistance: (NT)               Stairs             Wheelchair Mobility    Modified Rankin (Stroke Patients Only)       Balance Overall balance assessment: Needs assistance Sitting-balance support: Feet supported;Single extremity supported Sitting balance-Leahy Scale: Poor   Postural control: (posterior push to the R.)   Standing balance-Leahy Scale: Poor                              Cognition Arousal/Alertness: Awake/alert Behavior During Therapy: Flat affect Overall Cognitive Status: Difficult to assess                                 General Comments: He does not communicate but was able to follow commands to reach for phone and he will gaze at phone and attempt to scroll.  He will squeeze hand to commands on L side.      Exercises      General Comments        Pertinent Vitals/Pain Pain Assessment: Faces Faces Pain Scale: No hurt Pain Location: neck with motion Pain Descriptors / Indicators: Grimacing;Guarding Pain Intervention(s): Monitored during session;Repositioned    Home Living  Prior Function            PT Goals (current goals can now be found in the care plan section) Acute Rehab PT Goals Potential to Achieve Goals: Fair Progress towards PT goals: Progressing toward goals    Frequency    Min 2X/week      PT Plan Frequency needs to be updated    Co-evaluation              AM-PAC PT "6 Clicks" Mobility   Outcome Measure  Help needed turning from your back to your side while in a flat bed without using bedrails?: Total Help needed moving from lying on your back to sitting on the side of a flat bed  without using bedrails?: Total Help needed moving to and from a bed to a chair (including a wheelchair)?: Total Help needed standing up from a chair using your arms (e.g., wheelchair or bedside chair)?: Total Help needed to walk in hospital room?: Total Help needed climbing 3-5 steps with a railing? : Total 6 Click Score: 6    End of Session Equipment Utilized During Treatment: Gait belt Activity Tolerance: Patient limited by fatigue Patient left: with call bell/phone within reach;in chair;with chair alarm set;Other (comment)(used sitter alarm belt) Nurse Communication: Mobility status PT Visit Diagnosis: Muscle weakness (generalized) (M62.81);Difficulty in walking, not elsewhere classified (R26.2);Hemiplegia and hemiparesis Hemiplegia - Right/Left: Right Hemiplegia - dominant/non-dominant: Dominant Hemiplegia - caused by: Nontraumatic intracerebral hemorrhage     Time: 1120-1203 PT Time Calculation (min) (ACUTE ONLY): 43 min  Charges:  $Therapeutic Activity: 38-52 mins                     Governor Rooks, PTA Acute Rehabilitation Services Pager [phone] Office [phone]     Christop Hippert [name] 10/19/2019, 12:22 PM

## 2019-10-19 NOTE — Progress Notes (Signed)
PROGRESS NOTE  Joshua [name] [id number]:[id number] DOB: 1981-09-23 DOA: 09/09/2019 PCP: Patient, [name]  HPI/Recap of past 24 hours: Joshua [name] is a 38 y.o. M with no significant PMHx but apparently untreated severe HTN who presented with seizure-like activity, right-sided hemiparesis.  In the ER, Cr 2.29, K 2.4, CT head showed a large intraparenchymal hematoma and he was intubated.  Neurosurgery admitted the patient for emergent decompression.  10/19/19: Patient was seen and examined at his bedside this morning.  No tracking and not following commands.  Awaiting placement.  Vital signs and labs reviewed and are stable.      Assessment/Plan: Active Problems:   ICH (intracerebral hemorrhage) (HCC)   Subdural hematoma (HCC)   Intracerebral hemorrhage (HCC)   Cytotoxic brain edema (HCC)   Hemorrhagic stroke (HCC)   Acute respiratory failure ([place])   Essential hypertension   Severe protein-calorie malnutrition ([place])   CKD (chronic kidney disease), stage IV (HCC)   Seizure ([place])  Large left intraparenchymal hemorrhage status post decompressive craniectomy with hematoma evacuation on 09/09/19 by Joshua [name] Aphasic PTOT recs SNF Speech recs NPO  Seizure disorder likely secondary to intracranial hemorrhage No recent seizure activity Continue Keppra  Aspiration pneumonia Resolved Completed 7 days of abx (zosyn>> unasyn) O2 saturation 100% on room air Labs and vital signs reviewed and are stable  Resolved transient uncontrolled hypertension, unclear etiology BP is at goal Continue to monitor vital signs Continue home antihypertensive Currently on amlodipine 10 mg daily, HCTZ 13 mg daily per tube, 200 mg labetalol per tube 3 times daily Hold HCTZ due to recent AKI likely prerenal to avoid dehydration  Klebsiella oxytoca UTI Completed abx  Resolving AKI on CKD 3, likely prerenal in the setting of dehydration  Baseline cr 1.76 with GFR 55, at his baseline creatinine Creatinine  1.84 on 10/12/2019 Cr 1.59 with GFR >60 on 10/17/19. Creatinine 1.56 on 10/19/2019 with GFR greater than 60. 2.3 L recorded in the last 24 hours. Continue to avoid nephrotoxins Continue to hold off HCTZ Continue free water flushes to avoid dehydration  Severe physical debility/ambulatory dysfunction PT OT rec SNF CSW assisting with placement   DVT prophylaxis: Lovenox sq daily Code Status: FULL Family Communication:  None at bedside    Consultants:   Neurosurgery  Procedures:   9/12 intubation  9/12 craniotomy  9/18 extubation  9/18 re-intubation  9/23 extubation  10/1 EGD PEG placement    Antimicrobials:   Vancomycin 9/14 >> 9/15   Zosyn --> Unasyn 9/14 >> 9/20  Cefazolin --> Cephalexin 9/28 >> 10/2     Objective: Vitals:   10/19/19 0341 10/19/19 0754 10/19/19 0851 10/19/19 1214  BP: 121/80 (!) 124/91 (!) 113/91 110/80  Pulse: 81 93 82 90  Resp: _0 Temp: 98.8 F (37.1 C) 98.5 F (36.9 C) 98.5 F (36.9 C) 98.5 F (36.9 C)  TempSrc: Oral Oral Axillary Oral  SpO2: 100% 100% 99% 100%  Weight: 62.5 kg     Height:        Intake/Output Summary (Last 24 hours) at 10/19/2019 1300 Last data filed at 10/19/2019 0758 Gross per 24 hour  Intake -  Output 2825 ml  Net -2825 ml   Filed Weights   10/17/19 0410 10/18/19 0331 10/19/19 0341  Weight: 58.2 kg 59.3 kg 62.5 kg    Exam:  . General: 38 y.o. year-old male no acute distress alert but does not follow command and does not track.   . Cardiovascular: Regular rate  and rhythm no rubs or gallops . Respiratory: Clear to Auscultation No Wheezes or Rales.   . Abdomen: Nontender nondistended normal bowel sounds present PEG tube in place.   . Musculoskeletal: No lower extremity edema.  12 4 pulses in all 4 extremities.   Data Reviewed: CBC: Recent Labs  Lab 10/17/19 0616  WBC 5.3  HGB 10.1*  HCT 31.4*  MCV 88.0  PLT 607   Basic Metabolic Panel: Recent Labs  Lab 10/17/19 0616  10/19/19 0357  NA 136  --   K 4.6  --   CL 97*  --   CO2 28  --   GLUCOSE 113*  --   BUN 35*  --   CREATININE 1.59* 1.56*  CALCIUM 10.1  --   MG 1.9  --   PHOS 5.3*  --    GFR: Estimated Creatinine Clearance: 56.2 mL/min (A) (by C-G formula based on SCr of 1.56 mg/dL (H)). Liver Function Tests: No results for input(s): AST, ALT, ALKPHOS, BILITOT, PROT, ALBUMIN in the last 168 hours. No results for input(s): LIPASE, AMYLASE in the last 168 hours. No results for input(s): AMMONIA in the last 168 hours. Coagulation Profile: No results for input(s): INR, PROTIME in the last 168 hours. Cardiac Enzymes: No results for input(s): CKTOTAL, CKMB, CKMBINDEX, TROPONINI in the last 168 hours. BNP (last 3 results) No results for input(s): PROBNP in the last 8760 hours. HbA1C: No results for input(s): HGBA1C in the last 72 hours. CBG: Recent Labs  Lab 10/18/19 1937 10/18/19 2310 10/19/19 0339 10/19/19 0751 10/19/19 1211  GLUCAP 99 103* 113* 97 100*   Lipid Profile: No results for input(s): CHOL, HDL, LDLCALC, TRIG, CHOLHDL, LDLDIRECT in the last 72 hours. Thyroid Function Tests: No results for input(s): TSH, T4TOTAL, FREET4, T3FREE, THYROIDAB in the last 72 hours. Anemia Panel: No results for input(s): VITAMINB12, FOLATE, FERRITIN, TIBC, IRON, RETICCTPCT in the last 72 hours. Urine analysis:    Component Value Date/Time   COLORURINE YELLOW (A) 09/23/2019 1108   APPEARANCEUR CLEAR (A) 09/23/2019 1108   LABSPEC 1.015 09/23/2019 1108   PHURINE 7.5 09/23/2019 1108   GLUCOSEU NEGATIVE 09/23/2019 1108   HGBUR NEGATIVE 09/23/2019 1108   [place] 09/23/2019 1108   [place] 09/23/2019 1108   PROTEINUR NEGATIVE 09/23/2019 1108   UROBILINOGEN 1.0 04/05/2009 0021   NITRITE NEGATIVE 09/23/2019 1108   LEUKOCYTESUR NEGATIVE 09/23/2019 1108   Sepsis Labs: _0 (procalcitonin:4,lacticidven:4)  )No results found for this or any previous visit (from the past 240  hour(s)).    Studies: No results found.  Scheduled Meds: . alteplase  2 mg Intracatheter Once  . amLODipine  10 mg Per Tube Daily  . chlorhexidine gluconate (MEDLINE KIT)  15 mL Mouth Rinse BID  . enoxaparin (LOVENOX) injection  40 mg Subcutaneous Daily  . folic acid  1 mg Per Tube Daily  . free water  250 mL Per Tube Q4H  . insulin aspart  0-15 Units Subcutaneous Q4H  . labetalol  300 mg Per Tube TID  . levETIRAcetam  500 mg Per Tube BID  . lipase/protease/amylase)  20,880 Units Per Tube Once   And  . sodium bicarbonate  650 mg Per Tube Once  . mouth rinse  15 mL Mouth Rinse 10 times per day  . multivitamin  15 mL Per Tube Daily  . pantoprazole sodium  40 mg Per Tube BID  . QUEtiapine  50 mg Per Tube QHS  . sodium chloride flush  10-40 mL Intracatheter Q12H  .  thiamine  100 mg Per Tube Daily    Continuous Infusions: . feeding supplement (OSMOLITE 1.5 CAL) 1,000 mL (10/18/19 0004)     LOS: 40 days     Kayleen Memos, MD [facility] Pager [phone]  If 7PM-7AM, please contact night-coverage [email or web address] Password [email or web address] 10/19/2019, 1:00 PM

## 2019-10-20 LAB — GLUCOSE, CAPILLARY
Glucose-Capillary: 114 mg/dL — ABNORMAL HIGH (ref 70–99)
Glucose-Capillary: 123 mg/dL — ABNORMAL HIGH (ref 70–99)
Glucose-Capillary: 132 mg/dL — ABNORMAL HIGH (ref 70–99)
Glucose-Capillary: 80 mg/dL (ref 70–99)
Glucose-Capillary: 91 mg/dL (ref 70–99)
Glucose-Capillary: 99 mg/dL (ref 70–99)

## 2019-10-20 MED ORDER — LIP MEDEX EX OINT
TOPICAL_OINTMENT | CUTANEOUS | Status: DC | PRN
  Filled 2019-10-20: qty 7

## 2019-10-20 NOTE — Plan of Care (Signed)
  Problem: Education: Goal: Knowledge of General Education information will improve Description: Including pain rating scale, medication(s)/side effects and non-pharmacologic comfort measures Outcome: Progressing   Problem: Health Behavior/Discharge Planning: Goal: Ability to manage health-related needs will improve Outcome: Progressing   Problem: Clinical Measurements: Goal: Ability to maintain clinical measurements within normal limits will improve Outcome: Progressing Goal: Will remain free from infection Outcome: Progressing Goal: Diagnostic test results will improve Outcome: Progressing Goal: Respiratory complications will improve Outcome: Progressing Goal: Cardiovascular complication will be avoided Outcome: Progressing   Problem: Activity: Goal: Risk for activity intolerance will decrease Outcome: Progressing   Problem: Nutrition: Goal: Adequate nutrition will be maintained Outcome: Progressing   Problem: Coping: Goal: Level of anxiety will decrease Outcome: Progressing   Problem: Elimination: Goal: Will not experience complications related to bowel motility Outcome: Progressing Goal: Will not experience complications related to urinary retention Outcome: Progressing   Problem: Pain Managment: Goal: General experience of comfort will improve Outcome: Progressing   Problem: Safety: Goal: Ability to remain free from injury will improve Outcome: Progressing   Problem: Skin Integrity: Goal: Risk for impaired skin integrity will decrease Outcome: Progressing   Problem: Education: Goal: Knowledge of the prescribed therapeutic regimen will improve Outcome: Progressing   Problem: Clinical Measurements: Goal: Usual level of consciousness will be regained or maintained. Outcome: Progressing Goal: Neurologic status will improve Outcome: Progressing Goal: Ability to maintain intracranial pressure will improve Outcome: Progressing   Problem: Skin  Integrity: Goal: Demonstration of wound healing without infection will improve Outcome: Progressing   Problem: Education: Goal: Knowledge of disease or condition will improve Outcome: Progressing Goal: Knowledge of secondary prevention will improve Outcome: Progressing Goal: Knowledge of patient specific risk factors addressed and post discharge goals established will improve Outcome: Progressing Goal: Individualized Educational Video(s) Outcome: Progressing   Problem: Coping: Goal: Will verbalize positive feelings about self Outcome: Progressing Goal: Will identify appropriate support needs Outcome: Progressing   Problem: Health Behavior/Discharge Planning: Goal: Ability to manage health-related needs will improve Outcome: Progressing   Problem: Self-Care: Goal: Ability to participate in self-care as condition permits will improve Outcome: Progressing Goal: Ability to communicate needs accurately will improve Outcome: Progressing   Problem: Nutrition: Goal: Risk of aspiration will decrease Outcome: Progressing Goal: Dietary intake will improve Outcome: Progressing   Problem: Intracerebral Hemorrhage Tissue Perfusion: Goal: Complications of Intracerebral Hemorrhage will be minimized Outcome: Progressing   Ival Bible, BSN, RN

## 2019-10-20 NOTE — Progress Notes (Signed)
PROGRESS NOTE  Joshua [name] [id number]:[id number] DOB: [birth year] DOA: 09/09/2019 PCP: Patient, [name]  HPI/Recap of past 24 hours: Joshua [name] is a 38 y.o. M with no significant PMHx but apparently untreated severe HTN who presented with seizure-like activity, right-sided hemiparesis.  In the ER, Cr 2.29, K 2.4, CT head showed a large intraparenchymal hematoma and he was intubated.  Neurosurgery admitted the patient for emergent decompression.  10/20/19: Patient was seen and examined at bedside this morning.  No acute events overnight.  Still does not track abdominal pain, improved.  Awaiting placement.  N.p.o. per speech therapy.  PEG tube in place.  Vital signs reviewed and are stable.     Assessment/Plan: Active Problems:   ICH (intracerebral hemorrhage) (HCC)   Subdural hematoma (HCC)   Intracerebral hemorrhage (HCC)   Cytotoxic brain edema (HCC)   Hemorrhagic stroke (HCC)   Acute respiratory failure ([place])   Essential hypertension   Severe protein-calorie malnutrition ([place])   CKD (chronic kidney disease), stage IV (HCC)   Seizure ([place])  Large left intraparenchymal hemorrhage status post decompressive craniectomy with hematoma evacuation on 09/09/19 by Dr. Vertell Limber Aphasic PTOT recs SNF Speech recs NPO  Seizure disorder likely secondary to intracranial hemorrhage No recent seizure activity Continue Keppra  Aspiration pneumonia Resolved Completed 7 days of abx (zosyn>> unasyn) O2 saturation 100% on room air Labs and vital signs reviewed and are stable  Resolved transient uncontrolled hypertension, unclear etiology BP is at goal Continue to monitor vital signs Continue home antihypertensive Currently on amlodipine 10 mg daily, HCTZ 13 mg daily per tube, 200 mg labetalol per tube 3 times daily Hold HCTZ due to recent AKI likely prerenal to avoid dehydration  Klebsiella oxytoca UTI Completed abx  Resolving AKI on CKD 3, likely prerenal in the setting of dehydration   Baseline cr 1.76 with GFR 55, at his baseline creatinine Creatinine 1.84 on 10/12/2019 Cr 1.59 with GFR >60 on 10/17/19. Creatinine 1.56 on 10/19/2019 with GFR greater than 60. 2.3 L recorded in the last 24 hours. Continue to avoid nephrotoxins Continue to hold off HCTZ Continue free water flushes to avoid dehydration  Severe physical debility/ambulatory dysfunction PT OT rec SNF CSW assisting with placement   DVT prophylaxis: Lovenox sq daily Code Status: FULL Family Communication:  None at bedside    Consultants:   Neurosurgery  Procedures:   9/12 intubation  9/12 craniotomy  9/18 extubation  9/18 re-intubation  9/23 extubation  10/1 EGD PEG placement    Antimicrobials:   Vancomycin 9/14 >> 9/15   Zosyn --> Unasyn 9/14 >> 9/20  Cefazolin --> Cephalexin 9/28 >> 10/2     Objective: Vitals:   10/20/19 0402 10/20/19 0414 10/20/19 0729 10/20/19 1148  BP: (!) 128/99  (!) 124/94 (!) 124/94  Pulse: 62  79 72  Resp: _0 Temp: 98 F (36.7 C)  98.4 F (36.9 C) 98.3 F (36.8 C)  TempSrc: Oral  Axillary Axillary  SpO2: 100%  100% 100%  Weight:  60.5 kg    Height:        Intake/Output Summary (Last 24 hours) at 10/20/2019 1237 Last data filed at 10/20/2019 1107 Gross per 24 hour  Intake 10978 ml  Output 1700 ml  Net 9278 ml   Filed Weights   10/18/19 0331 10/19/19 0341 10/20/19 0414  Weight: 59.3 kg 62.5 kg 60.5 kg    Exam:  . General: 38 y.o. year-old male well-developed well-nourished.  Does not track and does  not follow commands.   . Cardiovascular: Regular rate and rhythm no rubs or gallops no JVD or thyromegaly. [name] Respiratory: Clear to auscultation no wheezes or Rales.   . Abdomen: Nontender nondistended normal bowel sounds present.  PEG Tube in Place.   Musculoskeletal: No lower extremity edema.  2 out of 4 pulses in all 4 extremities.  Data Reviewed: CBC: Recent Labs  Lab 10/17/19 0616  WBC 5.3  HGB 10.1*  HCT 31.4*   MCV 88.0  PLT 696   Basic Metabolic Panel: Recent Labs  Lab 10/17/19 0616 10/19/19 0357  NA 136  --   K 4.6  --   CL 97*  --   CO2 28  --   GLUCOSE 113*  --   BUN 35*  --   CREATININE 1.59* 1.56*  CALCIUM 10.1  --   MG 1.9  --   PHOS 5.3*  --    GFR: Estimated Creatinine Clearance: 54.4 mL/min (A) (by C-G formula based on SCr of 1.56 mg/dL (H)). Liver Function Tests: No results for input(s): AST, ALT, ALKPHOS, BILITOT, PROT, ALBUMIN in the last 168 hours. No results for input(s): LIPASE, AMYLASE in the last 168 hours. No results for input(s): AMMONIA in the last 168 hours. Coagulation Profile: No results for input(s): INR, PROTIME in the last 168 hours. Cardiac Enzymes: No results for input(s): CKTOTAL, CKMB, CKMBINDEX, TROPONINI in the last 168 hours. BNP (last 3 results) No results for input(s): PROBNP in the last 8760 hours. HbA1C: No results for input(s): HGBA1C in the last 72 hours. CBG: Recent Labs  Lab 10/19/19 1211 10/19/19 1538 10/19/19 2015 10/20/19 0000 10/20/19 0403  GLUCAP 100* 89 114* 99 80   Lipid Profile: No results for input(s): CHOL, HDL, LDLCALC, TRIG, CHOLHDL, LDLDIRECT in the last 72 hours. Thyroid Function Tests: No results for input(s): TSH, T4TOTAL, FREET4, T3FREE, THYROIDAB in the last 72 hours. Anemia Panel: No results for input(s): VITAMINB12, FOLATE, FERRITIN, TIBC, IRON, RETICCTPCT in the last 72 hours. Urine analysis:    Component Value Date/Time   COLORURINE YELLOW (A) 09/23/2019 1108   APPEARANCEUR CLEAR (A) 09/23/2019 1108   LABSPEC 1.015 09/23/2019 1108   PHURINE 7.5 09/23/2019 1108   GLUCOSEU NEGATIVE 09/23/2019 1108   HGBUR NEGATIVE 09/23/2019 1108   [place] 09/23/2019 1108   [place] 09/23/2019 1108   PROTEINUR NEGATIVE 09/23/2019 1108   UROBILINOGEN 1.0 04/05/2009 0021   NITRITE NEGATIVE 09/23/2019 1108   LEUKOCYTESUR NEGATIVE 09/23/2019 1108   Sepsis Labs:  _0 (procalcitonin:4,lacticidven:4)  )No results found for this or any previous visit (from the past 240 hour(s)).    Studies: No results found.  Scheduled Meds: . alteplase  2 mg Intracatheter Once  . amLODipine  10 mg Per Tube Daily  . chlorhexidine gluconate (MEDLINE KIT)  15 mL Mouth Rinse BID  . enoxaparin (LOVENOX) injection  40 mg Subcutaneous Daily  . folic acid  1 mg Per Tube Daily  . free water  250 mL Per Tube Q4H  . insulin aspart  0-15 Units Subcutaneous Q4H  . labetalol  300 mg Per Tube TID  . levETIRAcetam  500 mg Per Tube BID  . lipase/protease/amylase)  20,880 Units Per Tube Once   And  . sodium bicarbonate  650 mg Per Tube Once  . mouth rinse  15 mL Mouth Rinse 10 times per day  . multivitamin  15 mL Per Tube Daily  . pantoprazole sodium  40 mg Per Tube BID  . QUEtiapine  50  mg Per Tube QHS  . sodium chloride flush  10-40 mL Intracatheter Q12H  . thiamine  100 mg Per Tube Daily    Continuous Infusions: . feeding supplement (OSMOLITE 1.5 CAL) 1,000 mL (10/18/19 0004)     LOS: 41 days     Kayleen Memos, MD [facility] Pager [phone]  If 7PM-7AM, please contact night-coverage [email or web address] Password [facility] 10/20/2019, 12:37 PM

## 2019-10-20 NOTE — Progress Notes (Signed)
Occupational Therapy Treatment Patient Details Name: Joshua [name] MRN: [id number] DOB: 11-21-1981 Today's Date: 10/20/2019    History of present illness This pt is a 38 year old with history of alcohol abuse, seizures, noncompliant hypertension who presented with new seizure right-sided hemiplegia and confusion was found to have a large left intracranial hemorrhage now s/p left frontoparietal craniectomy for evacuation of left basal ganglia hematoma on 09/09/19. Skull flap in abdomen.  Intubated 9/12 (day of admission) and extubated 09/15/19.    OT comments  Pt. Seen for skilled OT treatment session.  Focus of session cont. Work with ROM BUEs. Tolerated well.  Will continue with current POC with focus on utilizing ue movement for functional tasks/adls.   Follow Up Recommendations  SNF;Supervision/Assistance - 24 hour    Equipment Recommendations  None recommended by OT    Recommendations for Other Services      Precautions / Restrictions Precautions Precautions: Fall Precaution Comments: L crani, bone flap removed and in abdomen, NG, L mitt       Mobility Bed Mobility                  Transfers                      Balance                                           ADL either performed or assessed with clinical judgement   ADL                                               Vision       Perception     Praxis      Cognition                                                Exercises General Exercises - Upper Extremity Shoulder Flexion: PROM;Both;10 reps Shoulder Extension: PROM;Right;10 reps;Supine Elbow Flexion: PROM;Both;10 reps Elbow Extension: PROM;Both;10 reps Wrist Flexion: PROM;Right;Supine;10 reps Wrist Extension: PROM;Both;10 reps Digit Composite Flexion: PROM;Right;Supine;10 reps Composite Extension: PROM;Right;Supine;10 reps   Shoulder Instructions       General Comments       Pertinent Vitals/ Pain          Home Living                                          Prior Functioning/Environment              Frequency  Min 2X/week        Progress Toward Goals  OT Goals(current goals can now be found in the care plan section)  Progress towards OT goals: Progressing toward goals     Plan      Co-evaluation                 AM-PAC OT "6 Clicks" Daily Activity     Outcome Measure   Help from another person eating meals?: Total Help from another person taking care  of personal grooming?: Total Help from another person toileting, which includes using toliet, bedpan, or urinal?: Total Help from another person bathing (including washing, rinsing, drying)?: Total Help from another person to put on and taking off regular upper body clothing?: Total Help from another person to put on and taking off regular lower body clothing?: Total 6 Click Score: 6    End of Session    OT Visit Diagnosis: Other abnormalities of gait and mobility (R26.89);Hemiplegia and hemiparesis;Cognitive communication deficit (R41.841) Symptoms and signs involving cognitive functions: Other Nontraumatic ICH Hemiplegia - Right/Left: Right   Activity Tolerance Patient tolerated treatment well   Patient Left in bed;with call bell/phone within reach;with bed alarm set;with restraints reapplied   Nurse Communication          Time: [id number] OT Time Calculation (min): 8 min  Charges: OT General Charges $OT Visit: 1 Visit OT Treatments $Therapeutic Exercise: 8-22 mins  Janice Coffin, COTA/L 10/20/2019, 1:01 PM

## 2019-10-21 LAB — GLUCOSE, CAPILLARY
Glucose-Capillary: 121 mg/dL — ABNORMAL HIGH (ref 70–99)
Glucose-Capillary: 113 mg/dL — ABNORMAL HIGH (ref 70–99)
Glucose-Capillary: 103 mg/dL — ABNORMAL HIGH (ref 70–99)

## 2019-10-21 NOTE — Progress Notes (Signed)
PROGRESS NOTE  Joshua [name] ZOX:[id number] DOB: 1981/09/15 DOA: 09/09/2019 PCP: Patient, [name]  HPI/Recap of past 24 hours: [name] is a 38 y.o. M with no significant PMHx but apparently untreated severe HTN who presented with seizure-like activity, right-sided hemiparesis.  In the ER, Cr 2.29, K 2.4, CT head showed a large intraparenchymal hematoma and he was intubated.  Neurosurgery admitted the patient for emergent decompression.  Hospital course complicated by post CVA dysphagia post PEG tube placement, not tracking with his eyes, not following commands.  Speech assessment with recommendation for n.p.o.  10/21/19: Patient was seen and examined at his bedside this morning.  No acute events overnight.  Vital signs reviewed and are stable.    Assessment/Plan: Active Problems:   ICH (intracerebral hemorrhage) (HCC)   Subdural hematoma (HCC)   Intracerebral hemorrhage (HCC)   Cytotoxic brain edema (HCC)   Hemorrhagic stroke (HCC)   Acute respiratory failure (HCC)   Essential hypertension   Severe protein-calorie malnutrition (HCC)   CKD (chronic kidney disease), stage IV (HCC)   Seizure (HCC)  Large left intraparenchymal hemorrhage status post decompressive craniectomy with hematoma evacuation on 09/09/19 by Dr. Vertell Limber Aphasic PTOT recs SNF Speech recs NPO At the time of this visit does not track with his eyes and does not follow commands. Awaiting placement to SNF.  CSW assisting with placement.  Seizure disorder likely secondary to intracranial hemorrhage No recent seizure activity Continue Keppra  Aspiration pneumonia Resolved Completed 7 days of abx (zosyn>> unasyn) O2 saturation 100% on room air Labs and vital signs reviewed and are stable  Resolved transient uncontrolled hypertension Blood pressure is currently at goal Continue amlodipine 10 mg daily, 300 mg labetalol per tube 3 times daily Continue to hold HCTZ due to recent AKI likely prerenal to avoid  dehydration  Klebsiella oxytoca UTI Completed abx  Resolving AKI on CKD 3, likely prerenal in the setting of dehydration  Baseline cr 1.76 with GFR 55, at his baseline creatinine Creatinine 1.84 on 10/12/2019 Cr 1.59 with GFR >60 on 10/17/19. Creatinine 1.56 on 10/19/2019 with GFR greater than 60. 2.2 L recorded in the last 24 hours. Continue to avoid nephrotoxins Continue to hold off HCTZ Continue free water flushes to avoid dehydration  Severe physical debility/ambulatory dysfunction PT OT rec SNF CSW assisting with placement   DVT prophylaxis: Lovenox sq daily Code Status: FULL Family Communication:  None at bedside    Consultants:   Neurosurgery  Procedures:   9/12 intubation  9/12 craniotomy  9/18 extubation  9/18 re-intubation  9/23 extubation  10/1 EGD PEG placement    Antimicrobials:   Vancomycin 9/14 >> 9/15   Zosyn --> Unasyn 9/14 >> 9/20  Cefazolin --> Cephalexin 9/28 >> 10/2     Objective: Vitals:   10/21/19 0333 10/21/19 0443 10/21/19 0733 10/21/19 1224  BP: 117/76  (!) 133/101 112/81  Pulse: 83  76 83  Resp: _0 Temp: (!) 97.5 F (36.4 C) 98.6 F (37 C) 98.6 F (37 C) 97.9 F (36.6 C)  TempSrc: Oral Oral Axillary Axillary  SpO2: 100%  99% 100%  Weight:      Height:        Intake/Output Summary (Last 24 hours) at 10/21/2019 1445 Last data filed at 10/21/2019 1051 Gross per 24 hour  Intake 3040 ml  Output 2200 ml  Net 840 ml   Filed Weights   10/19/19 0341 10/20/19 0414 10/21/19 0216  Weight: 62.5 kg 60.5 kg 61.1  kg    Exam:  . General: 38 y.o. year-old male well-developed well-nourished in no acute distress.  Does not track and does not follow commands. . Cardiovascular: Regular rate and rhythm no rubs or gallops no JVD or thyromegaly . Respiratory: Clear to auscultation no wheezes or rales.  Poor inspiratory effort. Abdomen: Nontender nondistended normal bowel sounds present.  PEG tube in place.    Musculoskeletal: No lower extremity edema.  2 out of 4 pulses in all 4 extremities. Data Reviewed: CBC: Recent Labs  Lab 10/17/19 0616  WBC 5.3  HGB 10.1*  HCT 31.4*  MCV 88.0  PLT 852   Basic Metabolic Panel: Recent Labs  Lab 10/17/19 0616 10/19/19 0357  NA 136  --   K 4.6  --   CL 97*  --   CO2 28  --   GLUCOSE 113*  --   BUN 35*  --   CREATININE 1.59* 1.56*  CALCIUM 10.1  --   MG 1.9  --   PHOS 5.3*  --    GFR: Estimated Creatinine Clearance: 54.9 mL/min (A) (by C-G formula based on SCr of 1.56 mg/dL (H)). Liver Function Tests: No results for input(s): AST, ALT, ALKPHOS, BILITOT, PROT, ALBUMIN in the last 168 hours. No results for input(s): LIPASE, AMYLASE in the last 168 hours. No results for input(s): AMMONIA in the last 168 hours. Coagulation Profile: No results for input(s): INR, PROTIME in the last 168 hours. Cardiac Enzymes: No results for input(s): CKTOTAL, CKMB, CKMBINDEX, TROPONINI in the last 168 hours. BNP (last 3 results) No results for input(s): PROBNP in the last 8760 hours. HbA1C: No results for input(s): HGBA1C in the last 72 hours. CBG: Recent Labs  Lab 10/20/19 1300 10/20/19 1545 10/20/19 1943 10/20/19 2337 10/21/19 1012  GLUCAP 123* 91 114* 132* 121*   Lipid Profile: No results for input(s): CHOL, HDL, LDLCALC, TRIG, CHOLHDL, LDLDIRECT in the last 72 hours. Thyroid Function Tests: No results for input(s): TSH, T4TOTAL, FREET4, T3FREE, THYROIDAB in the last 72 hours. Anemia Panel: No results for input(s): VITAMINB12, FOLATE, FERRITIN, TIBC, IRON, RETICCTPCT in the last 72 hours. Urine analysis:    Component Value Date/Time   COLORURINE YELLOW (A) 09/23/2019 1108   APPEARANCEUR CLEAR (A) 09/23/2019 1108   LABSPEC 1.015 09/23/2019 1108   PHURINE 7.5 09/23/2019 1108   GLUCOSEU NEGATIVE 09/23/2019 1108   HGBUR NEGATIVE 09/23/2019 1108   [place] 09/23/2019 1108   [place] 09/23/2019 1108   PROTEINUR NEGATIVE  09/23/2019 1108   UROBILINOGEN 1.0 04/05/2009 0021   NITRITE NEGATIVE 09/23/2019 1108   LEUKOCYTESUR NEGATIVE 09/23/2019 1108   Sepsis Labs: _0 (procalcitonin:4,lacticidven:4)  )No results found for this or any previous visit (from the past 240 hour(s)).    Studies: No results found.  Scheduled Meds: . alteplase  2 mg Intracatheter Once  . amLODipine  10 mg Per Tube Daily  . chlorhexidine gluconate (MEDLINE KIT)  15 mL Mouth Rinse BID  . enoxaparin (LOVENOX) injection  40 mg Subcutaneous Daily  . folic acid  1 mg Per Tube Daily  . free water  250 mL Per Tube Q4H  . insulin aspart  0-15 Units Subcutaneous Q4H  . labetalol  300 mg Per Tube TID  . levETIRAcetam  500 mg Per Tube BID  . lipase/protease/amylase)  20,880 Units Per Tube Once   And  . sodium bicarbonate  650 mg Per Tube Once  . mouth rinse  15 mL Mouth Rinse 10 times per day  .  multivitamin  15 mL Per Tube Daily  . pantoprazole sodium  40 mg Per Tube BID  . QUEtiapine  50 mg Per Tube QHS  . sodium chloride flush  10-40 mL Intracatheter Q12H  . thiamine  100 mg Per Tube Daily    Continuous Infusions: . feeding supplement (OSMOLITE 1.5 CAL) 1,000 mL (10/18/19 0004)     LOS: 42 days     Kayleen Memos, MD [facility] Pager [phone]  If 7PM-7AM, please contact night-coverage [email or web address] Password [email or web address] 10/21/2019, 2:45 PM

## 2019-10-22 DIAGNOSIS — R569 Unspecified convulsions: Secondary | ICD-10-CM

## 2019-10-22 LAB — GLUCOSE, CAPILLARY
Glucose-Capillary: 106 mg/dL — ABNORMAL HIGH (ref 70–99)
Glucose-Capillary: 108 mg/dL — ABNORMAL HIGH (ref 70–99)
Glucose-Capillary: 94 mg/dL (ref 70–99)
Glucose-Capillary: 104 mg/dL — ABNORMAL HIGH (ref 70–99)
Glucose-Capillary: 105 mg/dL — ABNORMAL HIGH (ref 70–99)
Glucose-Capillary: 88 mg/dL (ref 70–99)

## 2019-10-22 NOTE — Progress Notes (Signed)
PROGRESS NOTE  Jams Trickett YKZ:[id number] DOB: 1981-04-29 DOA: 09/09/2019 PCP: Patient, [name]  HPI/Recap of past 24 hours: Mr. Quest is a 38 y.o. M with no significant PMHx but apparently untreated severe HTN who presented with seizure-like activity, right-sided hemiparesis.  In the ER, Cr 2.29, K 2.4, CT head showed a large intraparenchymal hematoma and he was intubated.  Neurosurgery admitted the patient for emergent decompression.  Hospital course complicated by post CVA dysphagia post PEG tube placement, not tracking with his eyes, not following commands.  Speech assessment with recommendation for n.p.o.  10/22/19: No acute events overnight.  Vital signs have been stable.  Patient is nonverbal.  No evidence of pain or discomfort.    Assessment/Plan: Active Problems:   ICH (intracerebral hemorrhage) (HCC)   Subdural hematoma (HCC)   Intracerebral hemorrhage (HCC)   Cytotoxic brain edema (HCC)   Hemorrhagic stroke (HCC)   Acute respiratory failure (HCC)   Essential hypertension   Severe protein-calorie malnutrition (HCC)   CKD (chronic kidney disease), stage IV (HCC)   Seizure (HCC)  Large left intraparenchymal hemorrhage status post decompressive craniectomy with hematoma evacuation on 09/09/19 by Dr. Vertell Limber Aphasic PTOT recs SNF Speech recs NPO At the time of this visit does not track with his eyes and does not follow commands. Awaiting placement to SNF.  CSW assisting with placement.  Seizure disorder likely secondary to intracranial hemorrhage No recent seizure activity Continue Keppra  Aspiration pneumonia Resolved Completed 7 days of abx (zosyn>> unasyn) O2 saturation 100% on room air Labs and vital signs reviewed and are stable  Resolved transient uncontrolled hypertension Blood pressure is currently at goal Continue amlodipine 10 mg daily, 300 mg labetalol per tube 3 times daily Continue to hold HCTZ due to recent AKI likely prerenal to avoid  dehydration  Klebsiella oxytoca UTI Completed abx  Resolving AKI on CKD 3, likely prerenal in the setting of dehydration  Baseline cr 1.76 with GFR 55, at his baseline creatinine Creatinine 1.84 on 10/12/2019 Cr 1.59 with GFR >60 on 10/17/19. Creatinine 1.56 on 10/19/2019 with GFR greater than 60. 2.2 L recorded in the last 24 hours. Continue to avoid nephrotoxins Continue to hold off HCTZ Continue free water flushes to avoid dehydration  Severe physical debility/ambulatory dysfunction PT OT rec SNF CSW assisting with placement   DVT prophylaxis: Lovenox sq daily Code Status: FULL Family Communication:  None at bedside    Consultants:   Neurosurgery  Procedures:   9/12 intubation  9/12 craniotomy  9/18 extubation  9/18 re-intubation  9/23 extubation  10/1 EGD PEG placement    Antimicrobials:   Vancomycin 9/14 >> 9/15   Zosyn --> Unasyn 9/14 >> 9/20  Cefazolin --> Cephalexin 9/28 >> 10/2     Objective: Vitals:   10/21/19 2304 10/22/19 0334 10/22/19 0500 10/22/19 0749  BP: (!) 114/96 124/87  (!) 124/96  Pulse: 81 81  84  Resp: _0 Temp: 98 F (36.7 C) 98.5 F (36.9 C)  (!) 97.4 F (36.3 C)  TempSrc: Oral   Axillary  SpO2: 100% 100%  99%  Weight:   61.1 kg   Height:        Intake/Output Summary (Last 24 hours) at 10/22/2019 1125 Last data filed at 10/22/2019 0344 Gross per 24 hour  Intake -  Output 1400 ml  Net -1400 ml   Filed Weights   10/20/19 0414 10/21/19 0216 10/22/19 0500  Weight: 60.5 kg 61.1 kg 61.1 kg  Exam:  . General: 38 y.o. year-old male thin male.  Alert, but unable to follow commands or tracks with eyes. . Cardiovascular: Regular rate, no murmurs, rubs, gallops.  No JVD. [name] Respiratory: There to auscultation bilaterally with no wheezes, rales, rhonchi. . Abdomen: Soft, nontender, nondistended.  Normal bowel sounds.  PEG tube in place.   . Musculoskeletal: No lower extremity edema.  2 out of 4 pulses  in all 4 extremities. .  Data Reviewed: CBC: Recent Labs  Lab 10/17/19 0616  WBC 5.3  HGB 10.1*  HCT 31.4*  MCV 88.0  PLT 841   Basic Metabolic Panel: Recent Labs  Lab 10/17/19 0616 10/19/19 0357  NA 136  --   K 4.6  --   CL 97*  --   CO2 28  --   GLUCOSE 113*  --   BUN 35*  --   CREATININE 1.59* 1.56*  CALCIUM 10.1  --   MG 1.9  --   PHOS 5.3*  --    GFR: Estimated Creatinine Clearance: 54.9 mL/min (A) (by C-G formula based on SCr of 1.56 mg/dL (H)). Liver Function Tests: No results for input(s): AST, ALT, ALKPHOS, BILITOT, PROT, ALBUMIN in the last 168 hours. No results for input(s): LIPASE, AMYLASE in the last 168 hours. No results for input(s): AMMONIA in the last 168 hours. Coagulation Profile: No results for input(s): INR, PROTIME in the last 168 hours. Cardiac Enzymes: No results for input(s): CKTOTAL, CKMB, CKMBINDEX, TROPONINI in the last 168 hours. BNP (last 3 results) No results for input(s): PROBNP in the last 8760 hours. HbA1C: No results for input(s): HGBA1C in the last 72 hours. CBG: Recent Labs  Lab 10/21/19 1012 10/21/19 1632 10/21/19 2303 10/22/19 0335 10/22/19 0753  GLUCAP 121* 113* 103* 106* 108*   Lipid Profile: No results for input(s): CHOL, HDL, LDLCALC, TRIG, CHOLHDL, LDLDIRECT in the last 72 hours. Thyroid Function Tests: No results for input(s): TSH, T4TOTAL, FREET4, T3FREE, THYROIDAB in the last 72 hours. Anemia Panel: No results for input(s): VITAMINB12, FOLATE, FERRITIN, TIBC, IRON, RETICCTPCT in the last 72 hours. Urine analysis:    Component Value Date/Time   COLORURINE YELLOW (A) 09/23/2019 1108   APPEARANCEUR CLEAR (A) 09/23/2019 1108   LABSPEC 1.015 09/23/2019 1108   PHURINE 7.5 09/23/2019 1108   GLUCOSEU NEGATIVE 09/23/2019 1108   HGBUR NEGATIVE 09/23/2019 1108   [place] 09/23/2019 1108   [place] 09/23/2019 1108   PROTEINUR NEGATIVE 09/23/2019 1108   UROBILINOGEN 1.0 04/05/2009 0021    NITRITE NEGATIVE 09/23/2019 1108   LEUKOCYTESUR NEGATIVE 09/23/2019 1108   Sepsis Labs: _0 (procalcitonin:4,lacticidven:4)  )No results found for this or any previous visit (from the past 240 hour(s)).    Studies: No results found.  Scheduled Meds: . alteplase  2 mg Intracatheter Once  . amLODipine  10 mg Per Tube Daily  . chlorhexidine gluconate (MEDLINE KIT)  15 mL Mouth Rinse BID  . enoxaparin (LOVENOX) injection  40 mg Subcutaneous Daily  . folic acid  1 mg Per Tube Daily  . free water  250 mL Per Tube Q4H  . insulin aspart  0-15 Units Subcutaneous Q4H  . labetalol  300 mg Per Tube TID  . levETIRAcetam  500 mg Per Tube BID  . lipase/protease/amylase)  20,880 Units Per Tube Once   And  . sodium bicarbonate  650 mg Per Tube Once  . mouth rinse  15 mL Mouth Rinse 10 times per day  . multivitamin  15 mL Per Tube  Daily  . pantoprazole sodium  40 mg Per Tube BID  . QUEtiapine  50 mg Per Tube QHS  . sodium chloride flush  10-40 mL Intracatheter Q12H  . thiamine  100 mg Per Tube Daily    Continuous Infusions: . feeding supplement (OSMOLITE 1.5 CAL) 1,000 mL (10/18/19 0004)     LOS: 92 days    Truett Mainland, DO [facility]  10/22/2019, 11:25 AM

## 2019-10-23 LAB — GLUCOSE, CAPILLARY
Glucose-Capillary: 107 mg/dL — ABNORMAL HIGH (ref 70–99)
Glucose-Capillary: 104 mg/dL — ABNORMAL HIGH (ref 70–99)
Glucose-Capillary: 88 mg/dL (ref 70–99)
Glucose-Capillary: 125 mg/dL — ABNORMAL HIGH (ref 70–99)
Glucose-Capillary: 105 mg/dL — ABNORMAL HIGH (ref 70–99)

## 2019-10-23 NOTE — Progress Notes (Signed)
  Speech Language Pathology Treatment: Dysphagia;Cognitive-Linquistic  Patient Details Name: Joshua [name] MRN: [id number] DOB: 1981/09/27 Today's Date: 10/23/2019 Time: 1015-1030 SLP Time Calculation (min) (ACUTE ONLY): 15 min  Assessment / Plan / Recommendation Clinical Impression  Pt seen at bedside for skilled ST intervention targeting goals for safe swallow, communication of wants/needs, attention, and following directions. Pt continues to be unwilling to allow thorough oral care, pushing SLP's hand away during attempts. Pt WILL NOT open his jaw to allow cleaning of inside of oral cavity for SLP or RN (per RN report). Pt requires max multimodal cues to follow directions, but is inconsistent in his ability/willingness to participate. Pt demonstrates focused attention by opening eyes to verbal stim.  Pt did not accept trials of ice chips today. Given participation in therapy, minimal progress is being made toward established goals.    HPI HPI: Pt is a 38 year old male who presented with new seizure, and was found to have a large left BG intracranial hemorrhage s/p L frontal temporal craniectomy with evacuation 9/12. ETT 9/12-9/18; 9/18-9/23. PMH: HTN, alcoholism, noncompliance      SLP Plan  Continue with current plan of care       Recommendations  Diet recommendations: NPO Medication Administration: Via alternative means                Oral Care Recommendations: Oral care QID;Staff/trained caregiver to provide oral care Follow up Recommendations: 24 hour supervision/assistance;Skilled Nursing facility SLP Visit Diagnosis: Dysphagia, unspecified (R13.10);Cognitive communication deficit (R41.841) Plan: Continue with current plan of care       [place]. Joshua [name], [facility], [place] Speech Language Pathologist Office: [phone] Pager: [phone]  Joshua [name] 10/23/2019, 10:32 AM

## 2019-10-23 NOTE — Progress Notes (Signed)
PROGRESS NOTE  Quincy Boy WPY:[id number] DOB: Oct 02, 1981 DOA: 09/09/2019 PCP: Joshua [name], [name]  HPI/Recap of past 24 hours: Joshua [name] is a 38 y.o. M with no significant PMHx but apparently untreated severe HTN who presented with seizure-like activity, right-sided hemiparesis.  In the ER, Cr 2.29, K 2.4, CT head showed a large intraparenchymal hematoma and he was intubated.  Neurosurgery admitted the Joshua [name] for emergent decompression.  Hospital course complicated by post CVA dysphagia post PEG tube placement, not tracking with his eyes, not following commands.  Speech assessment with recommendation for n.p.o.  10/23/19: Joshua [name] was seen and examined at his bedside.  He is alert but does not follow commands and does not track with his eyes.     Assessment/Plan: Active Problems:   ICH (intracerebral hemorrhage) (HCC)   Subdural hematoma (HCC)   Intracerebral hemorrhage (HCC)   Cytotoxic brain edema (HCC)   Hemorrhagic stroke (HCC)   Acute respiratory failure (HCC)   Essential hypertension   Severe protein-calorie malnutrition (HCC)   CKD (chronic kidney disease), stage IV (HCC)   Seizure (HCC)  Large left intraparenchymal hemorrhage status post decompressive craniectomy with hematoma evacuation on 09/09/19 by Dr. Vertell Limber Aphasic PTOT recs SNF Speech recs NPO At the time of this visit does not track with his eyes and does not follow commands. Awaiting placement to SNF.  CSW assisting with placement.  Seizure disorder likely secondary to intracranial hemorrhage No recent seizure activity Continue Keppra  Aspiration pneumonia Resolved Completed 7 days of abx (zosyn>> unasyn) O2 saturation 100% on room air Labs and vital signs reviewed and are stable  Resolved transient uncontrolled hypertension Blood pressure is currently at goal Continue amlodipine 10 mg daily, 300 mg labetalol per tube 3 times daily Continue to hold HCTZ due to recent AKI likely prerenal to avoid  dehydration  Klebsiella oxytoca UTI Completed abx  Resolving AKI on CKD 3, likely prerenal in the setting of dehydration  Baseline cr 1.76 with GFR 55, at his baseline creatinine Creatinine 1.84 on 10/12/2019 Cr 1.59 with GFR >60 on 10/17/19. Creatinine 1.56 on 10/19/2019 with GFR greater than 60. 2.2 L recorded in the last 24 hours. Continue to avoid nephrotoxins Continue to hold off HCTZ Continue free water flushes to avoid dehydration  Severe physical debility/ambulatory dysfunction PT OT rec SNF CSW assisting with placement   DVT prophylaxis: Lovenox sq daily Code Status: FULL Family Communication:  None at bedside    Consultants:   Neurosurgery  Procedures:   9/12 intubation  9/12 craniotomy  9/18 extubation  9/18 re-intubation  9/23 extubation  10/1 EGD PEG placement    Antimicrobials:   Vancomycin 9/14 >> 9/15   Zosyn --> Unasyn 9/14 >> 9/20  Cefazolin --> Cephalexin 9/28 >> 10/2     Objective: Vitals:   10/23/19 0700 10/23/19 0938 10/23/19 1406 10/23/19 1543  BP: (!) 138/94 (!) 128/97 124/89 (!) 129/102  Pulse: 74 81 87 94  Resp: _0 Temp: 97.7 F (36.5 C) 98.9 F (37.2 C) 98.4 F (36.9 C) 98.9 F (37.2 C)  TempSrc: Oral Oral Axillary Oral  SpO2: 99% 100% 100% 100%  Weight:      Height:        Intake/Output Summary (Last 24 hours) at 10/23/2019 1602 Last data filed at 10/23/2019 1332 Gross per 24 hour  Intake -  Output 2200 ml  Net -2200 ml   Filed Weights   10/21/19 0216 10/22/19 0500 10/23/19 0500  Weight: 61.1 kg  61.1 kg 63.4 kg    Exam:  . General: 38 y.o. year-old male frail-appearing no acute distress.  Alert but does not track and does not follow commands. Cardiovascular: Regular rate and rhythm no rubs or gallops.  Respiratory: Clear auscultation no wheezes or rales.   Abdomen: Nontender nontender normal bowel sounds present.  PEG tube in place. Musculoskeletal: No lower extremity edema.  2/4  pulses in all 4 extremities.    Data Reviewed: CBC: Recent Labs  Lab 10/17/19 0616  WBC 5.3  HGB 10.1*  HCT 31.4*  MCV 88.0  PLT 983   Basic Metabolic Panel: Recent Labs  Lab 10/17/19 0616 10/19/19 0357  NA 136  --   K 4.6  --   CL 97*  --   CO2 28  --   GLUCOSE 113*  --   BUN 35*  --   CREATININE 1.59* 1.56*  CALCIUM 10.1  --   MG 1.9  --   PHOS 5.3*  --    GFR: Estimated Creatinine Clearance: 57 mL/min (A) (by C-G formula based on SCr of 1.56 mg/dL (H)). Liver Function Tests: No results for input(s): AST, ALT, ALKPHOS, BILITOT, PROT, ALBUMIN in the last 168 hours. No results for input(s): LIPASE, AMYLASE in the last 168 hours. No results for input(s): AMMONIA in the last 168 hours. Coagulation Profile: No results for input(s): INR, PROTIME in the last 168 hours. Cardiac Enzymes: No results for input(s): CKTOTAL, CKMB, CKMBINDEX, TROPONINI in the last 168 hours. BNP (last 3 results) No results for input(s): PROBNP in the last 8760 hours. HbA1C: No results for input(s): HGBA1C in the last 72 hours. CBG: Recent Labs  Lab 10/22/19 2316 10/23/19 0312 10/23/19 0801 10/23/19 1228 10/23/19 1524  GLUCAP 88 107* 104* 88 125*   Lipid Profile: No results for input(s): CHOL, HDL, LDLCALC, TRIG, CHOLHDL, LDLDIRECT in the last 72 hours. Thyroid Function Tests: No results for input(s): TSH, T4TOTAL, FREET4, T3FREE, THYROIDAB in the last 72 hours. Anemia Panel: No results for input(s): VITAMINB12, FOLATE, FERRITIN, TIBC, IRON, RETICCTPCT in the last 72 hours. Urine analysis:    Component Value Date/Time   COLORURINE YELLOW (A) 09/23/2019 1108   APPEARANCEUR CLEAR (A) 09/23/2019 1108   LABSPEC 1.015 09/23/2019 1108   PHURINE 7.5 09/23/2019 1108   GLUCOSEU NEGATIVE 09/23/2019 1108   HGBUR NEGATIVE 09/23/2019 1108   [place] 09/23/2019 1108   [place] 09/23/2019 1108   PROTEINUR NEGATIVE 09/23/2019 1108   UROBILINOGEN 1.0 04/05/2009 0021    NITRITE NEGATIVE 09/23/2019 1108   LEUKOCYTESUR NEGATIVE 09/23/2019 1108   Sepsis Labs: _0 (procalcitonin:4,lacticidven:4)  )No results found for this or any previous visit (from the past 240 hour(s)).    Studies: No results found.  Scheduled Meds: . alteplase  2 mg Intracatheter Once  . amLODipine  10 mg Per Tube Daily  . chlorhexidine gluconate (MEDLINE KIT)  15 mL Mouth Rinse BID  . enoxaparin (LOVENOX) injection  40 mg Subcutaneous Daily  . folic acid  1 mg Per Tube Daily  . free water  250 mL Per Tube Q4H  . insulin aspart  0-15 Units Subcutaneous Q4H  . labetalol  300 mg Per Tube TID  . levETIRAcetam  500 mg Per Tube BID  . lipase/protease/amylase)  20,880 Units Per Tube Once   And  . sodium bicarbonate  650 mg Per Tube Once  . mouth rinse  15 mL Mouth Rinse 10 times per day  . multivitamin  15 mL Per Tube Daily  .  pantoprazole sodium  40 mg Per Tube BID  . QUEtiapine  50 mg Per Tube QHS  . sodium chloride flush  10-40 mL Intracatheter Q12H  . thiamine  100 mg Per Tube Daily    Continuous Infusions: . feeding supplement (OSMOLITE 1.5 CAL) 1,000 mL (10/23/19 1415)     LOS: 57 days     Kayleen Memos, MD [facility] Pager [phone]  If 7PM-7AM, please contact night-coverage [email or web address] Password [email or web address] 10/23/2019, 4:02 PM

## 2019-10-24 LAB — GLUCOSE, CAPILLARY
Glucose-Capillary: 105 mg/dL — ABNORMAL HIGH (ref 70–99)
Glucose-Capillary: 120 mg/dL — ABNORMAL HIGH (ref 70–99)
Glucose-Capillary: 121 mg/dL — ABNORMAL HIGH (ref 70–99)
Glucose-Capillary: 126 mg/dL — ABNORMAL HIGH (ref 70–99)
Glucose-Capillary: 127 mg/dL — ABNORMAL HIGH (ref 70–99)
Glucose-Capillary: 93 mg/dL (ref 70–99)

## 2019-10-24 NOTE — Progress Notes (Signed)
Physical Therapy Treatment Patient Details Name: Joshua [name] MRN: [id number] DOB: [birth year] Today's Date: 10/24/2019    History of Present Illness This pt is a 38 year old with history of alcohol abuse, seizures, noncompliant hypertension who presented with new seizure right-sided hemiplegia and confusion was found to have a large left intracranial hemorrhage now s/p left frontoparietal craniectomy for evacuation of left basal ganglia hematoma on 09/09/19. Skull flap in abdomen.  Intubated 9/12 (day of admission) and extubated 09/15/19.     PT Comments    Pt mildly restless and feet starting off the bed on arrival.  Pt presented as reluctant to participate, being resistant to sitting up at first.  Emphasis mostly on warm up, transitions, sitting balance with inhibition of pushing, and sit to stand x3 to see if pushing might be moderated.  Pt resistant to transferring to the chair.    Follow Up Recommendations  SNF     Equipment Recommendations  Wheelchair (measurements PT);Wheelchair cushion (measurements PT);Hospital bed;3in1 (PT)    Recommendations for Other Services       Precautions / Restrictions Precautions Precautions: Fall Precaution Comments: L crani, bone flap removed and in abdomen, NG, L mitt    Mobility  Bed Mobility Overal bed mobility: Needs Assistance Bed Mobility: Supine to Sit;Sit to Supine     Supine to sit: Total assist;+2 for physical assistance Sit to supine: Max assist;Total assist;+2 for physical assistance   General bed mobility comments: It is difficult telling if pt is resistant to participation or resisisting motion.  Transfers Overall transfer level: Needs assistance Equipment used: None Transfers: Sit to/from Stand Sit to Stand: Max assist;+2 physical assistance;+2 safety/equipment         General transfer comment: sit to stand x3, 1 trial peri-care completed.  pt resisted attempts to get to the chair today and started laying down when  choice finally given.  Ambulation/Gait             General Gait Details: unable at this time   Stairs             Wheelchair Mobility    Modified Rankin (Stroke Patients Only) Modified Rankin (Stroke Patients Only) Modified Rankin: Severe disability     Balance Overall balance assessment: Needs assistance   Sitting balance-Leahy Scale: Poor Sitting balance - Comments: pt pushes R and posteriorly, but mildly moderated after standing trials.     Standing balance-Leahy Scale: Poor                              Cognition Arousal/Alertness: Awake/alert Behavior During Therapy: Flat affect Overall Cognitive Status: (aphasia, didn't follow VC even with gestures or TC's)                                        Exercises Other Exercises Other Exercises: ROM exercise hip/knee flex/ext to bil LE, graded resistance where approp.    General Comments General comments (skin integrity, edema, etc.): vss      Pertinent Vitals/Pain Pain Assessment: Faces Faces Pain Scale: No hurt Pain Intervention(s): Monitored during session    Home Living                      Prior Function            PT Goals (current goals can now be found in the  care plan section) Acute Rehab PT Goals Patient Stated Goal: none stated PT Goal Formulation: Patient unable to participate in goal setting Time For Goal Achievement: 11/02/19 Potential to Achieve Goals: Fair Progress towards PT goals: Progressing toward goals    Frequency    Min 2X/week      PT Plan Current plan remains appropriate    Co-evaluation              AM-PAC PT "6 Clicks" Mobility   Outcome Measure  Help needed turning from your back to your side while in a flat bed without using bedrails?: Total Help needed moving from lying on your back to sitting on the side of a flat bed without using bedrails?: Total Help needed moving to and from a bed to a chair (including a  wheelchair)?: Total Help needed standing up from a chair using your arms (e.g., wheelchair or bedside chair)?: Total Help needed to walk in hospital room?: Total Help needed climbing 3-5 steps with a railing? : Total 6 Click Score: 6    End of Session   Activity Tolerance: Patient tolerated treatment well Patient left: with call bell/phone within reach;in chair;with chair alarm set;Other (comment)(girlfriend present) Nurse Communication: Mobility status PT Visit Diagnosis: Muscle weakness (generalized) (M62.81);Difficulty in walking, not elsewhere classified (R26.2);Hemiplegia and hemiparesis Hemiplegia - Right/Left: Right Hemiplegia - dominant/non-dominant: Dominant Hemiplegia - caused by: Nontraumatic intracerebral hemorrhage     Time: [id number] PT Time Calculation (min) (ACUTE ONLY): 23 min  Charges:  $Therapeutic Activity: 8-22 mins $Neuromuscular Re-education: 8-22 mins                     10/24/2019  Donnella Sham, PT Acute Rehabilitation Services [phone]  (pager) [phone]  (office)   Tessie Fass Samanda Buske 10/24/2019, 5:31 PM

## 2019-10-24 NOTE — Progress Notes (Signed)
Nutrition Follow-up  DOCUMENTATION CODES:   Not applicable  INTERVENTION:  Continue Osmolite 1.5 formula via PEG atgoal rate of 22m/hr.  Free water flushes of 250 ml every 4 hours per MD.   Tube feeding to provide 2520kcal, 105grams of protein, and 27739mfree water.  NUTRITION DIAGNOSIS:   Inadequate oral intake related to inability to eat as evidenced by NPO status; ongoing  GOAL:   Patient will meet greater than or equal to 90% of their needs; met with TF  MONITOR:   TF tolerance, Weight trends  REASON FOR ASSESSMENT:   Ventilator, Consult Enteral/tube feeding initiation and management  ASSESSMENT:   [age]o male admitted with large left intracranial hemorrhage; S/P craniotomy. PMH includes HTN, alcoholism, noncompliance. 9/12left frontal temporal craniectomy and evacuation hematoma. 9/23 Extubated. 10/1 PEG placed.  Pt continues on NPO status and has been tolerating his tube feedings well. RD to continue with current orders and monitor for continued tolerance. Awaiting SNF placement.   Labs and medications reviewed.   Diet Order:   Diet Order            Diet NPO time specified  Diet effective midnight              EDUCATION NEEDS:   Not appropriate for education at this time  Skin:  Skin Assessment: Reviewed RN Assessment  Last BM:  10/25  Height:   Ht Readings from Last 1 Encounters:  09/09/19 _0  (1.753 m)    Weight:   Wt Readings from Last 1 Encounters:  10/24/19 61.6 kg    Ideal Body Weight:  72.7 kg  BMI:  Body mass index is 20.05 kg/m.  Estimated Nutritional Needs:   Kcal:  2100-2400  Protein:  105-125 grams  Fluid:  >/= 2 L/day    StCorrin ParkerMS, RD, LDN Pager # [phone] hours/ weekend pager # [phone]

## 2019-10-24 NOTE — Progress Notes (Signed)
PROGRESS NOTE  Joshua [name] [id number] DOB: 1981-09-18 DOA: 09/09/2019 PCP: Patient, [name]  HPI/Recap of past 24 hours: Joshua [name] is a 38 y.o. M with no significant PMHx but apparently untreated severe HTN who presented with seizure-like activity, right-sided hemiparesis.  In the ER, Cr 2.29, K 2.4, CT head showed a large intraparenchymal hematoma and he was intubated.  Neurosurgery admitted the patient for emergent decompression.  Hospital course complicated by post CVA dysphagia post PEG tube placement, not tracking with his eyes, not following commands.  Speech assessment with recommendation for n.p.o.  10/24/19: Patient was seen and examined at his bedside. No new complaints. No changes. VSS, Awaiting placement.   Assessment/Plan: Active Problems:   ICH (intracerebral hemorrhage) (HCC)   Subdural hematoma (HCC)   Intracerebral hemorrhage (HCC)   Cytotoxic brain edema (HCC)   Hemorrhagic stroke (HCC)   Acute respiratory failure (HCC)   Essential hypertension   Severe protein-calorie malnutrition (HCC)   CKD (chronic kidney disease), stage IV (HCC)   Seizure (HCC)  Large left intraparenchymal hemorrhage status post decompressive craniectomy with hematoma evacuation on 09/09/19 by Joshua [name] Aphasic PTOT recs SNF Speech recs NPO At the time of this visit does not track with his eyes and does not follow commands. Awaiting placement to SNF.  CSW assisting with placement.  Seizure disorder likely secondary to intracranial hemorrhage No recent seizure activity Continue Keppra  Aspiration pneumonia Resolved Completed 7 days of abx (zosyn>> unasyn) O2 saturation 100% on room air Labs and vital signs reviewed and are stable  Resolved transient uncontrolled hypertension Blood pressure is currently at goal Continue amlodipine 10 mg daily, 300 mg labetalol per tube 3 times daily Continue to hold HCTZ due to recent AKI likely prerenal to avoid dehydration   Klebsiella oxytoca UTI Completed abx  Resolving AKI on CKD 3, likely prerenal in the setting of dehydration  Baseline cr 1.76 with GFR 55, at his baseline creatinine Creatinine 1.84 on 10/12/2019 Cr 1.59 with GFR >60 on 10/17/19. Creatinine 1.56 on 10/19/2019 with GFR greater than 60. 2.2 L recorded in the last 24 hours. Continue to avoid nephrotoxins Continue to hold off HCTZ Continue free water flushes to avoid dehydration  Severe physical debility/ambulatory dysfunction PT OT rec SNF CSW assisting with placement   DVT prophylaxis: Lovenox sq daily Code Status: FULL Family Communication:  None at bedside    Consultants:   Neurosurgery  Procedures:   9/12 intubation  9/12 craniotomy  9/18 extubation  9/18 re-intubation  9/23 extubation  10/1 EGD PEG placement    Antimicrobials:   Vancomycin 9/14 >> 9/15   Zosyn --> Unasyn 9/14 >> 9/20  Cefazolin --> Cephalexin 9/28 >> 10/2     Objective: Vitals:   10/24/19 0428 10/24/19 0759 10/24/19 1153 10/24/19 1632  BP: (!) 123/95 (!) 115/94 110/87 (!) 132/98  Pulse: 79 84 85 85  Resp: '18 17 18 17  ' Temp: 97.6 F (36.4 C) 98.2 F (36.8 C) 97.7 F (36.5 C) 98.2 F (36.8 C)  TempSrc: Oral Oral Axillary Oral  SpO2: 100% 100% 99% 100%  Weight:      Height:        Intake/Output Summary (Last 24 hours) at 10/24/2019 1650 Last data filed at 10/24/2019 1638 Gross per 24 hour  Intake -  Output 2620 ml  Net -2620 ml   Filed Weights   10/22/19 0500 10/23/19 0500 10/24/19 0348  Weight: 61.1 kg 63.4 kg 61.6 kg    Exam:  .  General: 38 y.o. year-old male frail NAD. Alert and not following commands. Cardiovascular: Regular rate and rhythm no rubs or gallops.  Respiratory: Clear Auscultation No Wheezes or Rales.   Abdomen: Nontender nondistended, bowel sounds present.  PEG tube in place. Musculoskeletal: No lower extremity edema.   Data Reviewed: CBC: No results for input(s): WBC, NEUTROABS, HGB,  HCT, MCV, PLT in the last 168 hours. Basic Metabolic Panel: Recent Labs  Lab 10/19/19 0357  CREATININE 1.56*   GFR: Estimated Creatinine Clearance: 55.4 mL/min (A) (by C-G formula based on SCr of 1.56 mg/dL (H)). Liver Function Tests: No results for input(s): AST, ALT, ALKPHOS, BILITOT, PROT, ALBUMIN in the last 168 hours. No results for input(s): LIPASE, AMYLASE in the last 168 hours. No results for input(s): AMMONIA in the last 168 hours. Coagulation Profile: No results for input(s): INR, PROTIME in the last 168 hours. Cardiac Enzymes: No results for input(s): CKTOTAL, CKMB, CKMBINDEX, TROPONINI in the last 168 hours. BNP (last 3 results) No results for input(s): PROBNP in the last 8760 hours. HbA1C: No results for input(s): HGBA1C in the last 72 hours. CBG: Recent Labs  Lab 10/24/19 0035 10/24/19 0430 10/24/19 0750 10/24/19 1149 10/24/19 1623  GLUCAP 120* 127* 121* 126* 105*   Lipid Profile: No results for input(s): CHOL, HDL, LDLCALC, TRIG, CHOLHDL, LDLDIRECT in the last 72 hours. Thyroid Function Tests: No results for input(s): TSH, T4TOTAL, FREET4, T3FREE, THYROIDAB in the last 72 hours. Anemia Panel: No results for input(s): VITAMINB12, FOLATE, FERRITIN, TIBC, IRON, RETICCTPCT in the last 72 hours. Urine analysis:    Component Value Date/Time   COLORURINE YELLOW (A) 09/23/2019 1108   APPEARANCEUR CLEAR (A) 09/23/2019 1108   LABSPEC 1.015 09/23/2019 1108   PHURINE 7.5 09/23/2019 1108   GLUCOSEU NEGATIVE 09/23/2019 1108   HGBUR NEGATIVE 09/23/2019 1108   [place] 09/23/2019 1108   [place] 09/23/2019 1108   PROTEINUR NEGATIVE 09/23/2019 1108   UROBILINOGEN 1.0 04/05/2009 0021   NITRITE NEGATIVE 09/23/2019 1108   LEUKOCYTESUR NEGATIVE 09/23/2019 1108   Sepsis Labs: '@LABRCNTIP' (procalcitonin:4,lacticidven:4)  )No results found for this or any previous visit (from the past 240 hour(s)).    Studies: No results found.  Scheduled Meds:  . alteplase  2 mg Intracatheter Once  . amLODipine  10 mg Per Tube Daily  . chlorhexidine gluconate (MEDLINE KIT)  15 mL Mouth Rinse BID  . enoxaparin (LOVENOX) injection  40 mg Subcutaneous Daily  . folic acid  1 mg Per Tube Daily  . free water  250 mL Per Tube Q4H  . insulin aspart  0-15 Units Subcutaneous Q4H  . labetalol  300 mg Per Tube TID  . levETIRAcetam  500 mg Per Tube BID  . lipase/protease/amylase)  20,880 Units Per Tube Once   And  . sodium bicarbonate  650 mg Per Tube Once  . mouth rinse  15 mL Mouth Rinse 10 times per day  . multivitamin  15 mL Per Tube Daily  . pantoprazole sodium  40 mg Per Tube BID  . QUEtiapine  50 mg Per Tube QHS  . sodium chloride flush  10-40 mL Intracatheter Q12H  . thiamine  100 mg Per Tube Daily    Continuous Infusions: . feeding supplement (OSMOLITE 1.5 CAL) 1,000 mL (10/24/19 3825)     LOS: 64 days     Kayleen Memos, MD [facility] Pager [phone]  If 7PM-7AM, please contact night-coverage [email or web address] Password [email or web address] 10/24/2019, 4:50 PM

## 2019-10-25 LAB — GLUCOSE, CAPILLARY
Glucose-Capillary: 107 mg/dL — ABNORMAL HIGH (ref 70–99)
Glucose-Capillary: 111 mg/dL — ABNORMAL HIGH (ref 70–99)
Glucose-Capillary: 112 mg/dL — ABNORMAL HIGH (ref 70–99)
Glucose-Capillary: 116 mg/dL — ABNORMAL HIGH (ref 70–99)
Glucose-Capillary: 127 mg/dL — ABNORMAL HIGH (ref 70–99)
Glucose-Capillary: 131 mg/dL — ABNORMAL HIGH (ref 70–99)
Glucose-Capillary: 133 mg/dL — ABNORMAL HIGH (ref 70–99)
Glucose-Capillary: 95 mg/dL (ref 70–99)
Glucose-Capillary: 97 mg/dL (ref 70–99)
Glucose-Capillary: 99 mg/dL (ref 70–99)

## 2019-10-25 NOTE — Progress Notes (Signed)
PROGRESS NOTE  Joshua [name] OEH:[id number] DOB: 02-15-1981 DOA: 09/09/2019 PCP: Patient, [name]  HPI/Recap of past 24 hours: Joshua [name] is a 38 y.o. M with no significant PMHx but apparently untreated severe HTN who presented with seizure-like activity, right-sided hemiparesis.  In the ER, Cr 2.29, K 2.4, CT head showed a large intraparenchymal hematoma and he was intubated.  Neurosurgery admitted the patient for emergent decompression.  Hospital course complicated by post CVA dysphagia post PEG tube placement, not tracking with his eyes, not following commands.  Speech assessment with recommendation for n.p.o.  10/25/19: Patient was seen and examined at bedside.  No acute changes overnight.  No acute distress.  Continue PEG tube feeding, medication administration, and frequent turning.    Assessment/Plan: Active Problems:   ICH (intracerebral hemorrhage) (HCC)   Subdural hematoma (HCC)   Intracerebral hemorrhage (HCC)   Cytotoxic brain edema (HCC)   Hemorrhagic stroke (HCC)   Acute respiratory failure (HCC)   Essential hypertension   Severe protein-calorie malnutrition (HCC)   CKD (chronic kidney disease), stage IV (HCC)   Seizure (HCC)  Large left intraparenchymal hemorrhage status post decompressive craniectomy with hematoma evacuation on 09/09/19 by Dr. Vertell Limber Aphasic PTOT recs SNF Speech recs NPO At the time of this visit does not track with his eyes and does not follow commands. Awaiting placement to SNF.  CSW assisting with placement.  Seizure disorder likely secondary to intracranial hemorrhage No recent seizure activity Continue Keppra  Aspiration pneumonia Resolved Completed 7 days of abx (zosyn>> unasyn) O2 saturation 100% on room air Labs and vital signs reviewed and are stable  Resolved transient uncontrolled hypertension Blood pressure is currently at goal Continue amlodipine 10 mg daily, 300 mg labetalol per tube 3 times daily Continue to hold HCTZ  due to recent AKI likely prerenal to avoid dehydration  Klebsiella oxytoca UTI Completed abx  Resolving AKI on CKD 3, likely prerenal in the setting of dehydration  Baseline cr 1.76 with GFR 55, at his baseline creatinine Creatinine 1.84 on 10/12/2019 Cr 1.59 with GFR >60 on 10/17/19. Creatinine 1.56 on 10/19/2019 with GFR greater than 60. 2.2 L recorded in the last 24 hours. Continue to avoid nephrotoxins Continue to hold off HCTZ Continue free water flushes to avoid dehydration  Severe physical debility/ambulatory dysfunction PT OT rec SNF CSW assisting with placement   DVT prophylaxis: Lovenox sq daily Code Status: FULL Family Communication:  None at bedside    Consultants:   Neurosurgery  Procedures:   9/12 intubation  9/12 craniotomy  9/18 extubation  9/18 re-intubation  9/23 extubation  10/1 EGD PEG placement    Antimicrobials:   Vancomycin 9/14 >> 9/15   Zosyn --> Unasyn 9/14 >> 9/20  Cefazolin --> Cephalexin 9/28 >> 10/2     Objective: Vitals:   10/25/19 0355 10/25/19 0759 10/25/19 1225 10/25/19 1524  BP:  (!) 136/98 (!) 119/93 (!) 123/93  Pulse:  85 84 84  Resp:  '20 20 20  ' Temp:  97.6 F (36.4 C) 97.6 F (36.4 C) 97.8 F (36.6 C)  TempSrc:  Axillary Axillary Axillary  SpO2:  100% 95% 99%  Weight: 63.2 kg     Height:        Intake/Output Summary (Last 24 hours) at 10/25/2019 1610 Last data filed at 10/25/2019 1227 Gross per 24 hour  Intake -  Output 2150 ml  Net -2150 ml   Filed Weights   10/23/19 0500 10/24/19 0348 10/25/19 0355  Weight: 63.4 kg 61.6  kg 63.2 kg    Exam:  General: 38 y.o. year-old male from no acute distress.  Alert and not following commands. Cardiovascular: Regular rate and rhythm no rubs or gallops.  Respiratory: Clear auscultation no wheezes or Rales.   Abdomen: Nontender nondistended with bowel sounds present.  PEG tube in place.   Musculoskeletal: No lower extremity edema.   Data Reviewed:  CBC: No results for input(s): WBC, NEUTROABS, HGB, HCT, MCV, PLT in the last 168 hours. Basic Metabolic Panel: Recent Labs  Lab 10/19/19 0357  CREATININE 1.56*   GFR: Estimated Creatinine Clearance: 56.8 mL/min (A) (by C-G formula based on SCr of 1.56 mg/dL (H)). Liver Function Tests: No results for input(s): AST, ALT, ALKPHOS, BILITOT, PROT, ALBUMIN in the last 168 hours. No results for input(s): LIPASE, AMYLASE in the last 168 hours. No results for input(s): AMMONIA in the last 168 hours. Coagulation Profile: No results for input(s): INR, PROTIME in the last 168 hours. Cardiac Enzymes: No results for input(s): CKTOTAL, CKMB, CKMBINDEX, TROPONINI in the last 168 hours. BNP (last 3 results) No results for input(s): PROBNP in the last 8760 hours. HbA1C: No results for input(s): HGBA1C in the last 72 hours. CBG: Recent Labs  Lab 10/24/19 2028 10/24/19 2358 10/25/19 0412 10/25/19 0753 10/25/19 1223  GLUCAP 93 107* 97 99 95   Lipid Profile: No results for input(s): CHOL, HDL, LDLCALC, TRIG, CHOLHDL, LDLDIRECT in the last 72 hours. Thyroid Function Tests: No results for input(s): TSH, T4TOTAL, FREET4, T3FREE, THYROIDAB in the last 72 hours. Anemia Panel: No results for input(s): VITAMINB12, FOLATE, FERRITIN, TIBC, IRON, RETICCTPCT in the last 72 hours. Urine analysis:    Component Value Date/Time   COLORURINE YELLOW (A) 09/23/2019 1108   APPEARANCEUR CLEAR (A) 09/23/2019 1108   LABSPEC 1.015 09/23/2019 1108   PHURINE 7.5 09/23/2019 1108   GLUCOSEU NEGATIVE 09/23/2019 1108   HGBUR NEGATIVE 09/23/2019 1108   [place] 09/23/2019 1108   [place] 09/23/2019 1108   PROTEINUR NEGATIVE 09/23/2019 1108   UROBILINOGEN 1.0 04/05/2009 0021   NITRITE NEGATIVE 09/23/2019 1108   LEUKOCYTESUR NEGATIVE 09/23/2019 1108   Sepsis Labs: '@LABRCNTIP' (procalcitonin:4,lacticidven:4)  )No results found for this or any previous visit (from the past 240 hour(s)).     Studies: No results found.  Scheduled Meds: . alteplase  2 mg Intracatheter Once  . amLODipine  10 mg Per Tube Daily  . chlorhexidine gluconate (MEDLINE KIT)  15 mL Mouth Rinse BID  . enoxaparin (LOVENOX) injection  40 mg Subcutaneous Daily  . folic acid  1 mg Per Tube Daily  . free water  250 mL Per Tube Q4H  . insulin aspart  0-15 Units Subcutaneous Q4H  . labetalol  300 mg Per Tube TID  . levETIRAcetam  500 mg Per Tube BID  . lipase/protease/amylase)  20,880 Units Per Tube Once   And  . sodium bicarbonate  650 mg Per Tube Once  . mouth rinse  15 mL Mouth Rinse 10 times per day  . multivitamin  15 mL Per Tube Daily  . pantoprazole sodium  40 mg Per Tube BID  . QUEtiapine  50 mg Per Tube QHS  . sodium chloride flush  10-40 mL Intracatheter Q12H  . thiamine  100 mg Per Tube Daily    Continuous Infusions: . feeding supplement (OSMOLITE 1.5 CAL) 1,000 mL (10/24/19 2314)     LOS: 44 days     Kayleen Memos, MD [facility] Pager [phone]  If 7PM-7AM, please contact night-coverage  [email or web address] Password [email or web address] 10/25/2019, 4:10 PM

## 2019-10-26 LAB — GLUCOSE, CAPILLARY
Glucose-Capillary: 117 mg/dL — ABNORMAL HIGH (ref 70–99)
Glucose-Capillary: 139 mg/dL — ABNORMAL HIGH (ref 70–99)
Glucose-Capillary: 109 mg/dL — ABNORMAL HIGH (ref 70–99)
Glucose-Capillary: 102 mg/dL — ABNORMAL HIGH (ref 70–99)
Glucose-Capillary: 88 mg/dL (ref 70–99)
Glucose-Capillary: 109 mg/dL — ABNORMAL HIGH (ref 70–99)
Glucose-Capillary: 124 mg/dL — ABNORMAL HIGH (ref 70–99)

## 2019-10-26 LAB — CREATININE, SERUM
Creatinine, Ser: 1.59 mg/dL — ABNORMAL HIGH (ref 0.61–1.24)
GFR calc Af Amer: >60 mL/min (ref >60)
GFR calc non Af Amer: 54 mL/min — ABNORMAL LOW (ref >60)

## 2019-10-26 NOTE — Progress Notes (Signed)
  Speech Language Pathology Treatment: Dysphagia;Cognitive-Linquistic  Patient Details Name: Joshua [name] MRN: [id number] DOB: November 26, 1981 Today's Date: 10/26/2019 Time: 1100-1110 SLP Time Calculation (min) (ACUTE ONLY): 10 min  Assessment / Plan / Recommendation Clinical Impression  Pt seen at bedside for skilled ST intervention targeting goals for po readiness, attention, communication of wants and needs, and direction following. Pt was positioned looking toward the right upon arrival of SLP. Pt opened his eyes to voice, but did not make eye contact. No vocalization elicited. Pt did not follow commands despite max multimodal cues. SLP attempted oral care, which pt did not allow, swatting at my hand then grabbing the swab tightly. Pt would not accept trials of ice chips, swatting my hand and turning his head away. Pt was able to clearly indicate that he did not want to participate in these activities by his gestures.  Pt continues minimally participatory in treatment. Will continue to follow acutely, 1x/week for skilled ST for communication, cognition, and PO readiness.    HPI HPI: Pt is a 38 year old male who presented with new seizure, and was found to have a large left BG intracranial hemorrhage s/p L frontal temporal craniectomy with evacuation 9/12. ETT 9/12-9/18; 9/18-9/23. PMH: HTN, alcoholism, noncompliance      SLP Plan  Continue with current plan of care       Recommendations  Diet recommendations: NPO Medication Administration: Via alternative means                Oral Care Recommendations: Oral care QID;Staff/trained caregiver to provide oral care Follow up Recommendations: 24 hour supervision/assistance;Skilled Nursing facility SLP Visit Diagnosis: Dysphagia, unspecified (R13.10);Cognitive communication deficit (R41.841) Plan: Continue with current plan of care       [place]. [name], [facility] [place] [place] Speech Language Pathologist Office:  [phone] Pager: [phone]  [name] 10/26/2019, 11:23 AM

## 2019-10-26 NOTE — Progress Notes (Signed)
Physical Therapy Treatment Patient Details Name: Joshua [name] MRN: [id number] DOB: 31-Oct-1981 Today's Date: 10/26/2019    History of Present Illness This pt is a 38 year old with history of alcohol abuse, seizures, noncompliant hypertension who presented with new seizure right-sided hemiplegia and confusion was found to have a large left intracranial hemorrhage now s/p left frontoparietal craniectomy for evacuation of left basal ganglia hematoma on 09/09/19. Skull flap in abdomen.  Intubated 9/12 (day of admission) and extubated 09/15/19.     PT Comments    Patient progressing during session with sitting posture/balance and upright orientation.  Feel he needs prolonged out of bed time, but for safety would need tilt in space chair with waist belt.  Patient cooperative, though sometimes pushing due to likely difficulty with upright orientation.  After prolonged trunk and neck stretch and keeping head in neutral position his pushing relaxed and he actually held himself up holding onto bed rail.  Feel he remains appropriate for SNF level rehab.  PT to follow acutely.    Follow Up Recommendations  SNF     Equipment Recommendations  Wheelchair (measurements PT);Wheelchair cushion (measurements PT);Hospital bed;3in1 (PT)    Recommendations for Other Services       Precautions / Restrictions Precautions Precautions: Fall Precaution Comments: L crani, bone flap removed and in abdomen, NG, L mitt    Mobility  Bed Mobility Overal bed mobility: Needs Assistance Bed Mobility: Supine to Sit;Sit to Supine     Supine to sit: Max assist;+2 for physical assistance;+2 for safety/equipment;Total assist;HOB elevated Sit to supine: Max assist;+2 for physical assistance   General bed mobility comments: pushing to R so assist to come up and stay up at [facility]  Transfers                 General transfer comment: NT  Ambulation/Gait                 Stairs             Wheelchair  Mobility    Modified Rankin (Stroke Patients Only) Modified Rankin (Stroke Patients Only) Pre-Morbid Rankin Score: No symptoms Modified Rankin: Severe disability     Balance Overall balance assessment: Needs assistance Sitting-balance support: Feet supported Sitting balance-Leahy Scale: Poor Sitting balance - Comments: pushes to R work on stretching R trunk, neck, and working to visually orient to upright over about 20 minutes at EOB looking at objects in room (flowers, baloons, hygiene items on sink); end of session pt gripping footboard and able to hold himself up with L UE x about 30-45 seconds prior to leaning back and fatigue.                                    Cognition Arousal/Alertness: Awake/alert Behavior During Therapy: Flat affect Overall Cognitive Status: Difficult to assess                                 General Comments: limited engagement with session, but holding onto footboard and sitting with close S after working on trunk/head and neck at [employer]      Exercises      General Comments General comments (skin integrity, edema, etc.): VSS      Pertinent Vitals/Pain Pain Assessment: Faces Faces Pain Scale: Hurts little more Pain Location: neck with R rotation Pain Descriptors / Indicators: Grimacing;Guarding Pain  Intervention(s): Monitored during session;Repositioned;Limited activity within patient's tolerance    Home Living                      Prior Function            PT Goals (current goals can now be found in the care plan section) Progress towards PT goals: Progressing toward goals    Frequency    Min 2X/week      PT Plan Current plan remains appropriate    Co-evaluation              AM-PAC PT "6 Clicks" Mobility   Outcome Measure  Help needed turning from your back to your side while in a flat bed without using bedrails?: Total Help needed moving from lying on your back to sitting on the side  of a flat bed without using bedrails?: Total Help needed moving to and from a bed to a chair (including a wheelchair)?: Total Help needed standing up from a chair using your arms (e.g., wheelchair or bedside chair)?: Total Help needed to walk in hospital room?: Total Help needed climbing 3-5 steps with a railing? : Total 6 Click Score: 6    End of Session   Activity Tolerance: Patient tolerated treatment well Patient left: in bed;with call bell/phone within reach;with bed alarm set;with restraints reapplied   PT Visit Diagnosis: Muscle weakness (generalized) (M62.81);Difficulty in walking, not elsewhere classified (R26.2);Hemiplegia and hemiparesis Hemiplegia - Right/Left: Right Hemiplegia - dominant/non-dominant: Dominant Hemiplegia - caused by: Nontraumatic intracerebral hemorrhage     Time: [id number] PT Time Calculation (min) (ACUTE ONLY): 29 min  Charges:  $Therapeutic Activity: 8-22 mins $Neuromuscular Re-education: 8-22 mins                     Joshua [name], [place] [facility] [phone] 10/26/2019    Joshua [name] 10/26/2019, 5:48 PM

## 2019-10-26 NOTE — Progress Notes (Signed)
PROGRESS NOTE  Joshua [name] [id number] DOB: 27-Oct-1981 DOA: 09/09/2019 PCP: Patient, [name]  HPI/Recap of past 24 hours: Joshua [name] is a 38 y.o. M with no significant PMHx but apparently untreated severe HTN who presented with seizure-like activity, right-sided hemiparesis.  In the ER, Cr 2.29, K 2.4, CT head showed a large intraparenchymal hematoma and he was intubated.  Neurosurgery admitted the patient for emergent decompression.  Hospital course complicated by post CVA dysphagia post PEG tube placement, not tracking with his eyes, not following commands.  Speech assessment with recommendation for n.p.o.  10/26/19: Patient was seen and examined at his bedside.  No acute changes overnight.  Continue PEG tube feeding.  Continue medication administration via PEG tube.  Frequent turning.  Patient does not track and does not follow commands.   Assessment/Plan: Active Problems:   ICH (intracerebral hemorrhage) (HCC)   Subdural hematoma (HCC)   Intracerebral hemorrhage (HCC)   Cytotoxic brain edema (HCC)   Hemorrhagic stroke (HCC)   Acute respiratory failure (HCC)   Essential hypertension   Severe protein-calorie malnutrition (HCC)   CKD (chronic kidney disease), stage IV (HCC)   Seizure (HCC)  Large left intraparenchymal hemorrhage status post decompressive craniectomy with hematoma evacuation on 09/09/19 by Dr. Vertell Limber Aphasic PTOT recs SNF Speech recs NPO At the time of this visit does not track with his eyes and does not follow commands. Awaiting placement to SNF.  CSW assisting with placement.  Seizure disorder likely secondary to intracranial hemorrhage No recent seizure activity Continue Keppra  Aspiration pneumonia Resolved Completed 7 days of abx (zosyn>> unasyn) O2 saturation 100% on room air Labs and vital signs reviewed and are stable  Resolved transient uncontrolled hypertension Blood pressure is currently at goal Continue amlodipine 10 mg daily, 300 mg  labetalol per tube 3 times daily Continue to hold HCTZ due to recent AKI likely prerenal to avoid dehydration  Klebsiella oxytoca UTI Completed abx  Resolving AKI on CKD 3, likely prerenal in the setting of dehydration  Baseline cr 1.76 with GFR 55, at his baseline creatinine Creatinine 1.84 on 10/12/2019 Cr 1.59 with GFR >60 on 10/17/19. Creatinine 1.56 on 10/19/2019 with GFR greater than 60. 2.2 L recorded in the last 24 hours. Continue to avoid nephrotoxins Continue to hold off HCTZ Continue free water flushes to avoid dehydration  Severe physical debility/ambulatory dysfunction PT OT rec SNF CSW assisting with placement   DVT prophylaxis: Lovenox sq daily Code Status: FULL Family Communication:  None at bedside    Consultants:   Neurosurgery  Procedures:   9/12 intubation  9/12 craniotomy  9/18 extubation  9/18 re-intubation  9/23 extubation  10/1 EGD PEG placement    Antimicrobials:   Vancomycin 9/14 >> 9/15   Zosyn --> Unasyn 9/14 >> 9/20  Cefazolin --> Cephalexin 9/28 >> 10/2     Objective: Vitals:   10/26/19 0200 10/26/19 0339 10/26/19 0737 10/26/19 1132  BP:  119/71 128/84 122/90  Pulse:  86 86 86  Resp:  _0 Temp:  97.7 F (36.5 C) 98.6 F (37 C) 98.4 F (36.9 C)  TempSrc:  Axillary Axillary Axillary  SpO2:  100% 99% 99%  Weight: 60.7 kg     Height:        Intake/Output Summary (Last 24 hours) at 10/26/2019 1518 Last data filed at 10/26/2019 0500 Gross per 24 hour  Intake -  Output 1200 ml  Net -1200 ml   Filed Weights   10/24/19 0348 10/25/19  0355 10/26/19 0200  Weight: 61.6 kg 63.2 kg 60.7 kg    Exam:  General: 38 y.o. year-old male frail-appearing no acute distress.  Alert and does not track and does not follow commands.   Cardiovascular: Regular rate and rhythm no rubs or gallops.  Respiratory: Clear to auscultation no wheezes no rales.   Abdomen: Nontender nondistended bowel sounds PEG tube in place.   Musculoskeletal: No lower extremity edema.   Data Reviewed: CBC: No results for input(s): WBC, NEUTROABS, HGB, HCT, MCV, PLT in the last 168 hours. Basic Metabolic Panel: Recent Labs  Lab 10/26/19 0513  CREATININE 1.59*   GFR: Estimated Creatinine Clearance: 53.6 mL/min (A) (by C-G formula based on SCr of 1.59 mg/dL (H)). Liver Function Tests: No results for input(s): AST, ALT, ALKPHOS, BILITOT, PROT, ALBUMIN in the last 168 hours. No results for input(s): LIPASE, AMYLASE in the last 168 hours. No results for input(s): AMMONIA in the last 168 hours. Coagulation Profile: No results for input(s): INR, PROTIME in the last 168 hours. Cardiac Enzymes: No results for input(s): CKTOTAL, CKMB, CKMBINDEX, TROPONINI in the last 168 hours. BNP (last 3 results) No results for input(s): PROBNP in the last 8760 hours. HbA1C: No results for input(s): HGBA1C in the last 72 hours. CBG: Recent Labs  Lab 10/25/19 2121 10/26/19 0104 10/26/19 0353 10/26/19 0733 10/26/19 1129  GLUCAP 133* 117* 139* 109* 102*   Lipid Profile: No results for input(s): CHOL, HDL, LDLCALC, TRIG, CHOLHDL, LDLDIRECT in the last 72 hours. Thyroid Function Tests: No results for input(s): TSH, T4TOTAL, FREET4, T3FREE, THYROIDAB in the last 72 hours. Anemia Panel: No results for input(s): VITAMINB12, FOLATE, FERRITIN, TIBC, IRON, RETICCTPCT in the last 72 hours. Urine analysis:    Component Value Date/Time   COLORURINE YELLOW (A) 09/23/2019 1108   APPEARANCEUR CLEAR (A) 09/23/2019 1108   LABSPEC 1.015 09/23/2019 1108   PHURINE 7.5 09/23/2019 1108   GLUCOSEU NEGATIVE 09/23/2019 1108   HGBUR NEGATIVE 09/23/2019 1108   [place] 09/23/2019 1108   [place] 09/23/2019 1108   PROTEINUR NEGATIVE 09/23/2019 1108   UROBILINOGEN 1.0 04/05/2009 0021   NITRITE NEGATIVE 09/23/2019 1108   LEUKOCYTESUR NEGATIVE 09/23/2019 1108   Sepsis Labs: _0 (procalcitonin:4,lacticidven:4)  )No results  found for this or any previous visit (from the past 240 hour(s)).    Studies: No results found.  Scheduled Meds: . alteplase  2 mg Intracatheter Once  . amLODipine  10 mg Per Tube Daily  . chlorhexidine gluconate (MEDLINE KIT)  15 mL Mouth Rinse BID  . enoxaparin (LOVENOX) injection  40 mg Subcutaneous Daily  . folic acid  1 mg Per Tube Daily  . free water  250 mL Per Tube Q4H  . insulin aspart  0-15 Units Subcutaneous Q4H  . labetalol  300 mg Per Tube TID  . levETIRAcetam  500 mg Per Tube BID  . lipase/protease/amylase)  20,880 Units Per Tube Once   And  . sodium bicarbonate  650 mg Per Tube Once  . mouth rinse  15 mL Mouth Rinse 10 times per day  . multivitamin  15 mL Per Tube Daily  . pantoprazole sodium  40 mg Per Tube BID  . QUEtiapine  50 mg Per Tube QHS  . sodium chloride flush  10-40 mL Intracatheter Q12H  . thiamine  100 mg Per Tube Daily    Continuous Infusions: . feeding supplement (OSMOLITE 1.5 CAL) 1,000 mL (10/26/19 0629)     LOS: 47 days     Lorenda Cahill  [place] Crane, MD [facility] Pager [phone]  If 7PM-7AM, please contact night-coverage [email or web address] Password [email or web address] 10/26/2019, 3:18 PM

## 2019-10-26 NOTE — TOC Progression Note (Signed)
Transition of Care [facility]) - Progression Note    Patient Details  Name: Joshua [name] MRN: [id number] Date of Birth: 12/13/1981  [facility] CM/SW Contact  Pollie Friar, RN Phone Number: 10/26/2019, 4:19 PM  Clinical Narrative:    Pt continues with no bed offers. CM has resent his information to SNF's in the [place] and surrounding areas. TOC following.   Expected Discharge Plan: [place] Barriers to Discharge: Continued Medical Work up, SNF Pending payor source - LOG  Expected Discharge Plan and Services Expected Discharge Plan: [place] In-house Referral: Clinical Social Work Discharge Planning Services: NA Post Acute Care Choice: [place] Living arrangements for the past 2 months: Apartment                 DME Arranged: N/A DME Agency: NA       HH Arranged: NA HH Agency: NA         Social Determinants of Health (SDOH) Interventions    Readmission Risk Interventions No flowsheet data found.

## 2019-10-27 LAB — GLUCOSE, CAPILLARY
Glucose-Capillary: 129 mg/dL — ABNORMAL HIGH (ref 70–99)
Glucose-Capillary: 134 mg/dL — ABNORMAL HIGH (ref 70–99)
Glucose-Capillary: 123 mg/dL — ABNORMAL HIGH (ref 70–99)
Glucose-Capillary: 113 mg/dL — ABNORMAL HIGH (ref 70–99)
Glucose-Capillary: 109 mg/dL — ABNORMAL HIGH (ref 70–99)

## 2019-10-27 NOTE — Plan of Care (Signed)
Patient progressing towards plan of care goals. 

## 2019-10-27 NOTE — Progress Notes (Signed)
PROGRESS NOTE  Joshua [name] [id number] DOB: 1981-05-04 DOA: 09/09/2019 PCP: Patient, [name]  HPI/Recap of past 24 hours: [name] is a 38 y.o. M with no significant PMHx but apparently untreated severe HTN who presented with seizure-like activity, right-sided hemiparesis.  In the ER, Cr 2.29, K 2.4, CT head showed a large intraparenchymal hematoma and he was intubated.  Neurosurgery admitted the patient for emergent decompression.  Hospital course complicated by post CVA dysphagia post PEG tube placement, not tracking with his eyes, not following commands.  Speech assessment with recommendation for n.p.o.   10/27/19: Patient was seen and examined at his bedside this morning.  No acute events overnight.  No new changes.  Continue PEG tube feeding.  Alert but does not follow commands and does not track with his eyes.   Assessment/Plan: Active Problems:   ICH (intracerebral hemorrhage) (HCC)   Subdural hematoma (HCC)   Intracerebral hemorrhage (HCC)   Cytotoxic brain edema (HCC)   Hemorrhagic stroke (HCC)   Acute respiratory failure (HCC)   Essential hypertension   Severe protein-calorie malnutrition (HCC)   CKD (chronic kidney disease), stage IV (HCC)   Seizure (HCC)  Large left intraparenchymal hemorrhage status post decompressive craniectomy with hematoma evacuation on 09/09/19 by Dr. Vertell Limber Aphasic PTOT recs SNF Speech recs NPO At the time of this visit does not track with his eyes and does not follow commands. Awaiting placement to SNF.  CSW assisting with placement.  Seizure disorder likely secondary to intracranial hemorrhage No recent seizure activity Continue Keppra  Aspiration pneumonia Resolved Completed 7 days of abx (zosyn>> unasyn) O2 saturation 100% on room air Labs and vital signs reviewed and are stable  Resolved transient uncontrolled hypertension Blood pressure is currently at goal Continue amlodipine 10 mg daily, 300 mg labetalol per tube 3  times daily Continue to hold HCTZ due to recent AKI likely prerenal to avoid dehydration  Klebsiella oxytoca UTI Completed abx  Resolving AKI on CKD 3, likely prerenal in the setting of dehydration  Baseline cr 1.76 with GFR 55, at his baseline creatinine Creatinine 1.84 on 10/12/2019 Cr 1.59 with GFR >60 on 10/17/19. Creatinine 1.56 on 10/19/2019 with GFR greater than 60. 2.2 L recorded in the last 24 hours. Continue to avoid nephrotoxins Continue to hold off HCTZ Continue free water flushes to avoid dehydration  Severe physical debility/ambulatory dysfunction PT OT rec SNF CSW assisting with placement   DVT prophylaxis: Lovenox sq daily Code Status: FULL Family Communication:  None at bedside    Consultants:   Neurosurgery  Procedures:   9/12 intubation  9/12 craniotomy  9/18 extubation  9/18 re-intubation  9/23 extubation  10/1 EGD PEG placement    Antimicrobials:   Vancomycin 9/14 >> 9/15   Zosyn --> Unasyn 9/14 >> 9/20  Cefazolin --> Cephalexin 9/28 >> 10/2     Objective: Vitals:   10/27/19 0203 10/27/19 0204 10/27/19 0352 10/27/19 0900  BP:   117/81 126/84  Pulse:   89 88  Resp:   20 17  Temp:   98.5 F (36.9 C) 98.5 F (36.9 C)  TempSrc:   Axillary Axillary  SpO2:   100% 100%  Weight: 62.2 kg 62.2 kg    Height:        Intake/Output Summary (Last 24 hours) at 10/27/2019 1258 Last data filed at 10/27/2019 1200 Gross per 24 hour  Intake 280 ml  Output 1275 ml  Net -995 ml   Filed Weights   10/27/19 0053 10/27/19  0203 10/27/19 0204  Weight: 60.6 kg 62.2 kg 62.2 kg    Exam:  General: 38 y.o. year-old male frail-appearing.  Nonverbal does not track with his eyes and does not follow commands.   Cardiovascular: Regular rate and rhythm no rubs or gallops.  Respiratory: Clear to Auscultation No Wheezes No Rales.   Abdomen: Nontender nondistended bowel sounds present PEG tube in place.   Musculoskeletal: No lower extremity  edema.   Data Reviewed: CBC: No results for input(s): WBC, NEUTROABS, HGB, HCT, MCV, PLT in the last 168 hours. Basic Metabolic Panel: Recent Labs  Lab 10/26/19 0513  CREATININE 1.59*   GFR: Estimated Creatinine Clearance: 54.9 mL/min (A) (by C-G formula based on SCr of 1.59 mg/dL (H)). Liver Function Tests: No results for input(s): AST, ALT, ALKPHOS, BILITOT, PROT, ALBUMIN in the last 168 hours. No results for input(s): LIPASE, AMYLASE in the last 168 hours. No results for input(s): AMMONIA in the last 168 hours. Coagulation Profile: No results for input(s): INR, PROTIME in the last 168 hours. Cardiac Enzymes: No results for input(s): CKTOTAL, CKMB, CKMBINDEX, TROPONINI in the last 168 hours. BNP (last 3 results) No results for input(s): PROBNP in the last 8760 hours. HbA1C: No results for input(s): HGBA1C in the last 72 hours. CBG: Recent Labs  Lab 10/26/19 2038 10/26/19 2322 10/27/19 0307 10/27/19 0729 10/27/19 1221  GLUCAP 109* 124* 129* 134* 123*   Lipid Profile: No results for input(s): CHOL, HDL, LDLCALC, TRIG, CHOLHDL, LDLDIRECT in the last 72 hours. Thyroid Function Tests: No results for input(s): TSH, T4TOTAL, FREET4, T3FREE, THYROIDAB in the last 72 hours. Anemia Panel: No results for input(s): VITAMINB12, FOLATE, FERRITIN, TIBC, IRON, RETICCTPCT in the last 72 hours. Urine analysis:    Component Value Date/Time   COLORURINE YELLOW (A) 09/23/2019 1108   APPEARANCEUR CLEAR (A) 09/23/2019 1108   LABSPEC 1.015 09/23/2019 1108   PHURINE 7.5 09/23/2019 1108   GLUCOSEU NEGATIVE 09/23/2019 1108   HGBUR NEGATIVE 09/23/2019 1108   [place] 09/23/2019 1108   [place] 09/23/2019 1108   PROTEINUR NEGATIVE 09/23/2019 1108   UROBILINOGEN 1.0 04/05/2009 0021   NITRITE NEGATIVE 09/23/2019 1108   LEUKOCYTESUR NEGATIVE 09/23/2019 1108   Sepsis Labs: '@LABRCNTIP' (procalcitonin:4,lacticidven:4)  )No results found for this or any previous visit  (from the past 240 hour(s)).    Studies: No results found.  Scheduled Meds: . alteplase  2 mg Intracatheter Once  . amLODipine  10 mg Per Tube Daily  . chlorhexidine gluconate (MEDLINE KIT)  15 mL Mouth Rinse BID  . enoxaparin (LOVENOX) injection  40 mg Subcutaneous Daily  . folic acid  1 mg Per Tube Daily  . free water  250 mL Per Tube Q4H  . insulin aspart  0-15 Units Subcutaneous Q4H  . labetalol  300 mg Per Tube TID  . levETIRAcetam  500 mg Per Tube BID  . lipase/protease/amylase)  20,880 Units Per Tube Once   And  . sodium bicarbonate  650 mg Per Tube Once  . mouth rinse  15 mL Mouth Rinse 10 times per day  . multivitamin  15 mL Per Tube Daily  . pantoprazole sodium  40 mg Per Tube BID  . QUEtiapine  50 mg Per Tube QHS  . sodium chloride flush  10-40 mL Intracatheter Q12H  . thiamine  100 mg Per Tube Daily    Continuous Infusions: . feeding supplement (OSMOLITE 1.5 CAL) 1,000 mL (10/27/19 0001)     LOS: 48 days     Archie Patten  Malachi Carl, MD [facility] Pager [phone]  If 7PM-7AM, please contact night-coverage [email or web address] Password [email or web address] 10/27/2019, 12:58 PM

## 2019-10-28 LAB — GLUCOSE, CAPILLARY
Glucose-Capillary: 111 mg/dL — ABNORMAL HIGH (ref 70–99)
Glucose-Capillary: 111 mg/dL — ABNORMAL HIGH (ref 70–99)
Glucose-Capillary: 116 mg/dL — ABNORMAL HIGH (ref 70–99)
Glucose-Capillary: 119 mg/dL — ABNORMAL HIGH (ref 70–99)
Glucose-Capillary: 127 mg/dL — ABNORMAL HIGH (ref 70–99)
Glucose-Capillary: 95 mg/dL (ref 70–99)

## 2019-10-28 NOTE — Progress Notes (Signed)
PROGRESS NOTE  Joshua [name] [id number] DOB: Jul 01, 1981 DOA: 09/09/2019 PCP: Patient, [name]  HPI/Recap of past 24 hours: [name] is a 38 y.o. M with no significant PMHx but apparently untreated severe HTN who presented with seizure-like activity, right-sided hemiparesis.  In the ER, Cr 2.29, K 2.4, CT head showed a large intraparenchymal hematoma and he was intubated.  Neurosurgery admitted the patient for emergent decompression.  Hospital course complicated by post CVA dysphagia post PEG tube placement, not tracking with his eyes, not following commands.  Speech assessment with recommendation for n.p.o.  10/28/19: Patient was seen and examined at his bedside.  No acute events overnight.  No acute changes.  Does not follow commands and does not track with his eyes.  Continue PEG tube feedings.   Assessment/Plan: Active Problems:   ICH (intracerebral hemorrhage) (HCC)   Subdural hematoma (HCC)   Intracerebral hemorrhage (HCC)   Cytotoxic brain edema (HCC)   Hemorrhagic stroke (HCC)   Acute respiratory failure (HCC)   Essential hypertension   Severe protein-calorie malnutrition (HCC)   CKD (chronic kidney disease), stage IV (HCC)   Seizure (HCC)  Large left intraparenchymal hemorrhage status post decompressive craniectomy with hematoma evacuation on 09/09/19 by Dr. Vertell Limber Aphasic PTOT recs SNF Speech recs NPO At the time of this visit does not track with his eyes and does not follow commands. Awaiting placement to SNF.  CSW assisting with placement.  Seizure disorder likely secondary to intracranial hemorrhage No recent seizure activity Continue Keppra  Aspiration pneumonia Resolved Completed 7 days of abx (zosyn>> unasyn) O2 saturation 100% on room air Labs and vital signs reviewed and are stable  Resolved transient uncontrolled hypertension Blood pressure is currently at goal Continue amlodipine 10 mg daily, 300 mg labetalol per tube 3 times daily Continue  to hold HCTZ due to recent AKI likely prerenal to avoid dehydration  Klebsiella oxytoca UTI Completed abx  Resolving AKI on CKD 3, likely prerenal in the setting of dehydration  Baseline cr 1.76 with GFR 55, at his baseline creatinine Creatinine 1.84 on 10/12/2019 Cr 1.59 with GFR >60 on 10/17/19. Creatinine 1.56 on 10/19/2019 with GFR greater than 60. 2.2 L recorded in the last 24 hours. Continue to avoid nephrotoxins Continue to hold off HCTZ Continue free water flushes to avoid dehydration  Severe physical debility/ambulatory dysfunction PT OT rec SNF CSW assisting with placement   DVT prophylaxis: Lovenox sq daily Code Status: FULL Family Communication:  None at bedside    Consultants:   Neurosurgery  Procedures:   9/12 intubation  9/12 craniotomy  9/18 extubation  9/18 re-intubation  9/23 extubation  10/1 EGD PEG placement    Antimicrobials:   Vancomycin 9/14 >> 9/15   Zosyn --> Unasyn 9/14 >> 9/20  Cefazolin --> Cephalexin 9/28 >> 10/2     Objective: Vitals:   10/28/19 0011 10/28/19 0355 10/28/19 0411 10/28/19 0808  BP: 116/89 (!) 135/92  (!) 136/103  Pulse: 82 93  90  Resp: '16 18  16  ' Temp: 98.5 F (36.9 C) 98.1 F (36.7 C)  98.1 F (36.7 C)  TempSrc: Oral Axillary  Axillary  SpO2: 100% 100%  98%  Weight:   61.4 kg   Height:        Intake/Output Summary (Last 24 hours) at 10/28/2019 1216 Last data filed at 10/28/2019 0641 Gross per 24 hour  Intake 3889 ml  Output 3125 ml  Net 764 ml   Filed Weights   10/27/19 0203 10/27/19 0093  10/28/19 0411  Weight: 62.2 kg 62.2 kg 61.4 kg    Exam:  General: 38 y.o. year-old male frail-appearing no acute distress.  Does not follow commands.   Cardiovascular: Regular rate and rhythm no rubs or gallops.  Respiratory: Clear to auscultation no wheezes no Rales.   Abdomen: Nontender nondistended bowel sounds present.  PEG tube in place.   Musculoskeletal: No lower extremity edema.    Data Reviewed: CBC: No results for input(s): WBC, NEUTROABS, HGB, HCT, MCV, PLT in the last 168 hours. Basic Metabolic Panel: Recent Labs  Lab 10/26/19 0513  CREATININE 1.59*   GFR: Estimated Creatinine Clearance: 54.2 mL/min (A) (by C-G formula based on SCr of 1.59 mg/dL (H)). Liver Function Tests: No results for input(s): AST, ALT, ALKPHOS, BILITOT, PROT, ALBUMIN in the last 168 hours. No results for input(s): LIPASE, AMYLASE in the last 168 hours. No results for input(s): AMMONIA in the last 168 hours. Coagulation Profile: No results for input(s): INR, PROTIME in the last 168 hours. Cardiac Enzymes: No results for input(s): CKTOTAL, CKMB, CKMBINDEX, TROPONINI in the last 168 hours. BNP (last 3 results) No results for input(s): PROBNP in the last 8760 hours. HbA1C: No results for input(s): HGBA1C in the last 72 hours. CBG: Recent Labs  Lab 10/27/19 1657 10/27/19 1954 10/28/19 0007 10/28/19 0352 10/28/19 0807  GLUCAP 113* 109* 127* 119* 116*   Lipid Profile: No results for input(s): CHOL, HDL, LDLCALC, TRIG, CHOLHDL, LDLDIRECT in the last 72 hours. Thyroid Function Tests: No results for input(s): TSH, T4TOTAL, FREET4, T3FREE, THYROIDAB in the last 72 hours. Anemia Panel: No results for input(s): VITAMINB12, FOLATE, FERRITIN, TIBC, IRON, RETICCTPCT in the last 72 hours. Urine analysis:    Component Value Date/Time   COLORURINE YELLOW (A) 09/23/2019 1108   APPEARANCEUR CLEAR (A) 09/23/2019 1108   LABSPEC 1.015 09/23/2019 1108   PHURINE 7.5 09/23/2019 1108   GLUCOSEU NEGATIVE 09/23/2019 1108   HGBUR NEGATIVE 09/23/2019 1108   [place] 09/23/2019 1108   [place] 09/23/2019 1108   PROTEINUR NEGATIVE 09/23/2019 1108   UROBILINOGEN 1.0 04/05/2009 0021   NITRITE NEGATIVE 09/23/2019 1108   LEUKOCYTESUR NEGATIVE 09/23/2019 1108   Sepsis Labs: '@LABRCNTIP' (procalcitonin:4,lacticidven:4)  )No results found for this or any previous visit (from the  past 240 hour(s)).    Studies: No results found.  Scheduled Meds: . alteplase  2 mg Intracatheter Once  . amLODipine  10 mg Per Tube Daily  . chlorhexidine gluconate (MEDLINE KIT)  15 mL Mouth Rinse BID  . enoxaparin (LOVENOX) injection  40 mg Subcutaneous Daily  . folic acid  1 mg Per Tube Daily  . free water  250 mL Per Tube Q4H  . insulin aspart  0-15 Units Subcutaneous Q4H  . labetalol  300 mg Per Tube TID  . levETIRAcetam  500 mg Per Tube BID  . lipase/protease/amylase)  20,880 Units Per Tube Once   And  . sodium bicarbonate  650 mg Per Tube Once  . mouth rinse  15 mL Mouth Rinse 10 times per day  . multivitamin  15 mL Per Tube Daily  . pantoprazole sodium  40 mg Per Tube BID  . QUEtiapine  50 mg Per Tube QHS  . sodium chloride flush  10-40 mL Intracatheter Q12H  . thiamine  100 mg Per Tube Daily    Continuous Infusions: . feeding supplement (OSMOLITE 1.5 CAL) 1,000 mL (10/27/19 0001)     LOS: 49 days     Kayleen Memos, MD [facility]  Pager [phone]  If 7PM-7AM, please contact night-coverage [email or web address] Password [email or web address] 10/28/2019, 12:16 PM

## 2019-10-28 NOTE — Plan of Care (Signed)
  Problem: Education: Goal: Knowledge of General Education information will improve Description: Including pain rating scale, medication(s)/side effects and non-pharmacologic comfort measures Outcome: Progressing   Problem: Health Behavior/Discharge Planning: Goal: Ability to manage health-related needs will improve Outcome: Progressing   Problem: Clinical Measurements: Goal: Ability to maintain clinical measurements within normal limits will improve Outcome: Progressing Goal: Will remain free from infection Outcome: Progressing Goal: Diagnostic test results will improve Outcome: Progressing Goal: Respiratory complications will improve Outcome: Progressing Goal: Cardiovascular complication will be avoided Outcome: Progressing   Problem: Activity: Goal: Risk for activity intolerance will decrease Outcome: Progressing   Problem: Nutrition: Goal: Adequate nutrition will be maintained Outcome: Progressing   Problem: Coping: Goal: Level of anxiety will decrease Outcome: Progressing   Problem: Elimination: Goal: Will not experience complications related to bowel motility Outcome: Progressing Goal: Will not experience complications related to urinary retention Outcome: Progressing   Problem: Pain Managment: Goal: General experience of comfort will improve Outcome: Progressing   Problem: Safety: Goal: Ability to remain free from injury will improve Outcome: Progressing   Problem: Skin Integrity: Goal: Risk for impaired skin integrity will decrease Outcome: Progressing   Problem: Clinical Measurements: Goal: Usual level of consciousness will be regained or maintained. Outcome: Progressing Goal: Neurologic status will improve Outcome: Progressing Goal: Ability to maintain intracranial pressure will improve Outcome: Progressing   Problem: Skin Integrity: Goal: Demonstration of wound healing without infection will improve Outcome: Progressing   Problem:  Education: Goal: Knowledge of disease or condition will improve Outcome: Progressing Goal: Knowledge of secondary prevention will improve Outcome: Progressing Goal: Knowledge of patient specific risk factors addressed and post discharge goals established will improve Outcome: Progressing Goal: Individualized Educational Video(s) Outcome: Progressing   Problem: Coping: Goal: Will verbalize positive feelings about self Outcome: Progressing Goal: Will identify appropriate support needs Outcome: Progressing   Problem: Health Behavior/Discharge Planning: Goal: Ability to manage health-related needs will improve Outcome: Progressing   Problem: Self-Care: Goal: Ability to participate in self-care as condition permits will improve Outcome: Progressing Goal: Ability to communicate needs accurately will improve Outcome: Progressing   Problem: Nutrition: Goal: Risk of aspiration will decrease Outcome: Progressing Goal: Dietary intake will improve Outcome: Progressing   Problem: Intracerebral Hemorrhage Tissue Perfusion: Goal: Complications of Intracerebral Hemorrhage will be minimized Outcome: Progressing   Ival Bible, BSN, RN

## 2019-10-28 NOTE — Plan of Care (Signed)
Patient is progressing towards plan of care goals. 

## 2019-10-29 LAB — GLUCOSE, CAPILLARY
Glucose-Capillary: 109 mg/dL — ABNORMAL HIGH (ref 70–99)
Glucose-Capillary: 110 mg/dL — ABNORMAL HIGH (ref 70–99)
Glucose-Capillary: 120 mg/dL — ABNORMAL HIGH (ref 70–99)
Glucose-Capillary: 124 mg/dL — ABNORMAL HIGH (ref 70–99)
Glucose-Capillary: 84 mg/dL (ref 70–99)
Glucose-Capillary: 92 mg/dL (ref 70–99)
Glucose-Capillary: 94 mg/dL (ref 70–99)

## 2019-10-29 NOTE — TOC Progression Note (Signed)
Transition of Care [facility]) - Progression Note    Patient Details  Name: Joshua [name] MRN: [id number] Date of Birth: 02/08/1981  Transition of Care [facility]) CM/SW [place], [place] Phone Number: 10/29/2019, 12:25 PM  Clinical Narrative:     No current bed offers.   CSW will continue to follow and assist with disposition planning.   Expected Discharge Plan: [place] Barriers to Discharge: Continued Medical Work up, SNF Pending payor source - LOG  Expected Discharge Plan and Services Expected Discharge Plan: [place] In-house Referral: Clinical Social Work Discharge Planning Services: NA Post Acute Care Choice: [place] Living arrangements for the past 2 months: Apartment                 DME Arranged: N/A DME Agency: NA       HH Arranged: NA HH Agency: NA         Social Determinants of Health (SDOH) Interventions    Readmission Risk Interventions No flowsheet data found.

## 2019-10-29 NOTE — Progress Notes (Signed)
PROGRESS NOTE  Avin Gibbons [id number] DOB: 15-Sep-1981 DOA: 09/09/2019 PCP: Patient, [name]  HPI/Recap of past 24 hours: Mr. Faucett is a 38 y.o. M with no significant PMHx but apparently untreated severe HTN who presented with seizure-like activity, right-sided hemiparesis.  In the ER, Cr 2.29, K 2.4, CT head showed a large intraparenchymal hematoma and he was intubated.  Neurosurgery admitted the patient for emergent decompression.  Hospital course complicated by post CVA dysphagia post PEG tube placement, not tracking with his eyes, not following commands.  Speech assessment with recommendation for n.p.o.  10/29/19: Patient was seen and examined at his bedside.  No events overnight.  No acute changes.  Does not follow commands.  Continue PEG tube feedings.    Assessment/Plan: Active Problems:   ICH (intracerebral hemorrhage) (HCC)   Subdural hematoma (HCC)   Intracerebral hemorrhage (HCC)   Cytotoxic brain edema (HCC)   Hemorrhagic stroke (HCC)   Acute respiratory failure (HCC)   Essential hypertension   Severe protein-calorie malnutrition (HCC)   CKD (chronic kidney disease), stage IV (HCC)   Seizure (HCC)  Large left intraparenchymal hemorrhage status post decompressive craniectomy with hematoma evacuation on 09/09/19 by Dr. Vertell Limber Aphasic PTOT recs SNF Speech recs NPO At the time of this visit does not track with his eyes and does not follow commands. Awaiting placement to SNF.  CSW assisting with placement.  Seizure disorder likely secondary to intracranial hemorrhage No recent seizure activity Continue Keppra  Aspiration pneumonia Resolved Completed 7 days of abx (zosyn>> unasyn) O2 saturation 100% on room air Labs and vital signs reviewed and are stable  Resolved transient uncontrolled hypertension Blood pressure is currently at goal Continue amlodipine 10 mg daily, 300 mg labetalol per tube 3 times daily Continue to hold HCTZ due to recent AKI likely  prerenal to avoid dehydration  Klebsiella oxytoca UTI Completed abx  Resolving AKI on CKD 3, likely prerenal in the setting of dehydration  Baseline cr 1.76 with GFR 55, at his baseline creatinine Creatinine 1.84 on 10/12/2019 Cr 1.59 with GFR >60 on 10/17/19. Creatinine 1.56 on 10/19/2019 with GFR greater than 60. 2.2 L recorded in the last 24 hours. Continue to avoid nephrotoxins Continue to hold off HCTZ Continue free water flushes to avoid dehydration  Severe physical debility/ambulatory dysfunction PT OT rec SNF CSW assisting with placement   DVT prophylaxis: Lovenox sq daily Code Status: FULL Family Communication:  None at bedside    Consultants:   Neurosurgery  Procedures:   9/12 intubation  9/12 craniotomy  9/18 extubation  9/18 re-intubation  9/23 extubation  10/1 EGD PEG placement    Antimicrobials:   Vancomycin 9/14 >> 9/15   Zosyn --> Unasyn 9/14 >> 9/20  Cefazolin --> Cephalexin 9/28 >> 10/2     Objective: Vitals:   10/28/19 2024 10/28/19 2355 10/29/19 0327 10/29/19 0900  BP: (!) 138/108 118/85 (!) 129/94 (!) 145/106  Pulse: 93 93 98 89  Resp: '18 18 18 16  ' Temp: 98.4 F (36.9 C) 97.8 F (36.6 C) 98.5 F (36.9 C) 98.3 F (36.8 C)  TempSrc: Oral Oral Oral Axillary  SpO2: 100% 100% 100% 100%  Weight:      Height:        Intake/Output Summary (Last 24 hours) at 10/29/2019 1111 Last data filed at 10/29/2019 0335 Gross per 24 hour  Intake 424 ml  Output 1850 ml  Net -1426 ml   Filed Weights   10/27/19 0203 10/27/19 0204 10/28/19 0411  Weight:  62.2 kg 62.2 kg 61.4 kg    Exam:  General: 38 y.o. year-old male Frail in no acute distress.  Alert but does not follow commands and does not track with his eyes.  Cardiovascular: Regular rate and rhythm no rubs or gallops.  Respiratory: Clear to auscultation no wheezes no Rales.   Abdomen: Nontender mostly normal bowel sounds present.  PEG tube in place.   Musculoskeletal: No LE  edema   Data Reviewed: CBC: No results for input(s): WBC, NEUTROABS, HGB, HCT, MCV, PLT in the last 168 hours. Basic Metabolic Panel: Recent Labs  Lab 10/26/19 0513  CREATININE 1.59*   GFR: Estimated Creatinine Clearance: 54.2 mL/min (A) (by C-G formula based on SCr of 1.59 mg/dL (H)). Liver Function Tests: No results for input(s): AST, ALT, ALKPHOS, BILITOT, PROT, ALBUMIN in the last 168 hours. No results for input(s): LIPASE, AMYLASE in the last 168 hours. No results for input(s): AMMONIA in the last 168 hours. Coagulation Profile: No results for input(s): INR, PROTIME in the last 168 hours. Cardiac Enzymes: No results for input(s): CKTOTAL, CKMB, CKMBINDEX, TROPONINI in the last 168 hours. BNP (last 3 results) No results for input(s): PROBNP in the last 8760 hours. HbA1C: No results for input(s): HGBA1C in the last 72 hours. CBG: Recent Labs  Lab 10/28/19 1648 10/28/19 1954 10/29/19 0001 10/29/19 0320 10/29/19 0811  GLUCAP 95 111* 92 110* 109*   Lipid Profile: No results for input(s): CHOL, HDL, LDLCALC, TRIG, CHOLHDL, LDLDIRECT in the last 72 hours. Thyroid Function Tests: No results for input(s): TSH, T4TOTAL, FREET4, T3FREE, THYROIDAB in the last 72 hours. Anemia Panel: No results for input(s): VITAMINB12, FOLATE, FERRITIN, TIBC, IRON, RETICCTPCT in the last 72 hours. Urine analysis:    Component Value Date/Time   COLORURINE YELLOW (A) 09/23/2019 1108   APPEARANCEUR CLEAR (A) 09/23/2019 1108   LABSPEC 1.015 09/23/2019 1108   PHURINE 7.5 09/23/2019 1108   GLUCOSEU NEGATIVE 09/23/2019 1108   HGBUR NEGATIVE 09/23/2019 1108   [place] 09/23/2019 1108   [place] 09/23/2019 1108   PROTEINUR NEGATIVE 09/23/2019 1108   UROBILINOGEN 1.0 04/05/2009 0021   NITRITE NEGATIVE 09/23/2019 1108   LEUKOCYTESUR NEGATIVE 09/23/2019 1108   Sepsis Labs: '@LABRCNTIP' (procalcitonin:4,lacticidven:4)  )No results found for this or any previous visit (from  the past 240 hour(s)).    Studies: No results found.  Scheduled Meds: . alteplase  2 mg Intracatheter Once  . amLODipine  10 mg Per Tube Daily  . chlorhexidine gluconate (MEDLINE KIT)  15 mL Mouth Rinse BID  . enoxaparin (LOVENOX) injection  40 mg Subcutaneous Daily  . folic acid  1 mg Per Tube Daily  . free water  250 mL Per Tube Q4H  . insulin aspart  0-15 Units Subcutaneous Q4H  . labetalol  300 mg Per Tube TID  . levETIRAcetam  500 mg Per Tube BID  . lipase/protease/amylase)  20,880 Units Per Tube Once   And  . sodium bicarbonate  650 mg Per Tube Once  . mouth rinse  15 mL Mouth Rinse 10 times per day  . multivitamin  15 mL Per Tube Daily  . pantoprazole sodium  40 mg Per Tube BID  . QUEtiapine  50 mg Per Tube QHS  . sodium chloride flush  10-40 mL Intracatheter Q12H  . thiamine  100 mg Per Tube Daily    Continuous Infusions: . feeding supplement (OSMOLITE 1.5 CAL) 1,000 mL (10/29/19 0525)     LOS: 50 days     Archie Patten  Malachi Carl, MD [facility] Pager [phone]  If 7PM-7AM, please contact night-coverage [email or web address] Password [email or web address] 10/29/2019, 11:11 AM

## 2019-10-30 LAB — GLUCOSE, CAPILLARY
Glucose-Capillary: 108 mg/dL — ABNORMAL HIGH (ref 70–99)
Glucose-Capillary: 136 mg/dL — ABNORMAL HIGH (ref 70–99)
Glucose-Capillary: 105 mg/dL — ABNORMAL HIGH (ref 70–99)
Glucose-Capillary: 104 mg/dL — ABNORMAL HIGH (ref 70–99)
Glucose-Capillary: 98 mg/dL (ref 70–99)
Glucose-Capillary: 91 mg/dL (ref 70–99)
Glucose-Capillary: 128 mg/dL — ABNORMAL HIGH (ref 70–99)
Glucose-Capillary: 81 mg/dL (ref 70–99)

## 2019-10-30 NOTE — Progress Notes (Signed)
Occupational Therapy Treatment Patient Details Name: Joshua [name] MRN: [id number] DOB: 02/16/1981 Today's Date: 10/30/2019    History of present illness This pt is a 38 year old with history of alcohol abuse, seizures, noncompliant hypertension who presented with new seizure right-sided hemiplegia and confusion was found to have a large left intracranial hemorrhage now s/p left frontoparietal craniectomy for evacuation of left basal ganglia hematoma on 09/09/19. Skull flap in abdomen.  Intubated 9/12 (day of admission) and extubated 09/15/19.    OT comments  Pt not making progress towards goals today due to decreased engagement in session. Pt continues to present with decreased attention to R side, balance, ROM, and activity tolerance. Pt required max VCs and tactile cues to engage in session. Pt required max A +2 to maintain sitting balance EOB, and required total A for oral care. Pt did open mouth to allow teeth to be brushed by his brother. Recommend dc to SNF to address deficits and increase engagement to perform ADLs. Will continue to follow as admitted.    Follow Up Recommendations  SNF;Supervision/Assistance - 24 hour    Equipment Recommendations  Other (comment)(defer to next venue)    Recommendations for Other Services      Precautions / Restrictions Precautions Precautions: Fall Precaution Comments: L crani, bone flap removed and in abdomen, NG, L mitt Restrictions Weight Bearing Restrictions: No       Mobility Bed Mobility Overal bed mobility: Needs Assistance Bed Mobility: Supine to Sit     Supine to sit: Max assist;+2 for physical assistance;+2 for safety/equipment;Total assist;HOB elevated     General bed mobility comments: pt required max A- total A +2 to come up to sitting EOB. Pt assisted to elevate trunk into sitting. Once sitting, pt pushing to the R side  Transfers Overall transfer level: Needs assistance Equipment used: 2 person hand held  assist Transfers: Squat Pivot Transfers     Squat pivot transfers: Total assist;+2 physical assistance;+2 safety/equipment     General transfer comment: Pt required total A +2 for squate pivot to transfer to chair    Balance Overall balance assessment: Needs assistance Sitting-balance support: Feet supported;Single extremity supported Sitting balance-Leahy Scale: Poor Sitting balance - Comments: Pt pushes to R side, requires max A to maintain sitting EOB Postural control: Right lateral lean;Posterior lean                                 ADL either performed or assessed with clinical judgement   ADL Overall ADL's : Needs assistance/impaired     Grooming: Oral care;Total assistance;Sitting Grooming Details (indicate cue type and reason): Pt required max A to maintain sitting EOB, was total A for oral care. Pt did open mouth and allow his brother and therapist to brush teeth/tongue.                               General ADL Comments: Focused session on performing oral care sitting EOB. Pt required total A to brush teeth, required max A to maintain sitting balance.      Vision   Vision Assessment?: Vision impaired- to be further tested in functional context;Yes Additional Comments: Pt not making eye contact with therapist and unable to attend to L side   Perception     Praxis      Cognition Arousal/Alertness: Awake/alert Behavior During Therapy: Flat affect Overall Cognitive Status: Difficult to  assess Area of Impairment: Attention;Memory;Following commands;Safety/judgement;Awareness;Problem solving                   Current Attention Level: Focused Memory: Decreased short-term memory Following Commands: Follows one step commands inconsistently Safety/Judgement: Decreased awareness of safety;Decreased awareness of deficits Awareness: Intellectual Problem Solving: Slow processing;Decreased initiation;Difficulty sequencing;Requires verbal  cues;Requires tactile cues General Comments: Pt demonstrated limited engagement in session, required max VCs to participate. Unable to follow simple commands. Pt did improve in ability to open mouth to allow teeth to be brushed by brother.        Exercises     Shoulder Instructions       General Comments VSS. Pt's girlfriend and brother present throughout session    Pertinent Vitals/ Pain       Pain Assessment: Faces Faces Pain Scale: Hurts little more Pain Location: neck with R rotation Pain Descriptors / Indicators: Grimacing;Guarding Pain Intervention(s): Monitored during session;Repositioned  Home Living                                          Prior Functioning/Environment              Frequency  Min 2X/week        Progress Toward Goals  OT Goals(current goals can now be found in the care plan section)  Progress towards OT goals: Not progressing toward goals - comment(Pt limited engagement in therapy sessions)  Acute Rehab OT Goals Patient Stated Goal: none stated OT Goal Formulation: Patient unable to participate in goal setting Time For Goal Achievement: 11/13/19 Potential to Achieve Goals: Fair ADL Goals Pt Will Perform Grooming: sitting;with mod assist Pt/caregiver will Perform Home Exercise Program: Increased ROM;Increased strength;Right Upper extremity;With minimal assist Additional ADL Goal #1: Pt will maintain static balance sitting EOB with modA to increase independence with ADL task. Additional ADL Goal #2: Pt will track towards R visual field with no more than mod cues. Additional ADL Goal #3: Pt will follow one step commands with 50% accuracy during functional task.  Plan Discharge plan remains appropriate;Frequency remains appropriate    Co-evaluation                 AM-PAC OT "6 Clicks" Daily Activity     Outcome Measure   Help from another person eating meals?: Total Help from another person taking care of  personal grooming?: Total Help from another person toileting, which includes using toliet, bedpan, or urinal?: Total Help from another person bathing (including washing, rinsing, drying)?: Total Help from another person to put on and taking off regular upper body clothing?: Total Help from another person to put on and taking off regular lower body clothing?: Total 6 Click Score: 6    End of Session Equipment Utilized During Treatment: Gait belt;Other (comment)(resting hand split, elbow splint)  OT Visit Diagnosis: Other abnormalities of gait and mobility (R26.89);Hemiplegia and hemiparesis;Cognitive communication deficit (R41.841) Symptoms and signs involving cognitive functions: Other Nontraumatic ICH Hemiplegia - Right/Left: Right Hemiplegia - caused by: Other Nontraumatic intracranial hemorrhage   Activity Tolerance Patient tolerated treatment well   Patient Left in chair;with call bell/phone within reach;with chair alarm set;with family/visitor present   Nurse Communication Mobility status        Time: [id number] OT Time Calculation (min): 40 min  Charges: OT General Charges $OT Visit: 1 Visit OT Treatments $Self Care/Home Management : 38-52 mins  Gus Rankin, OT Student  Gus Rankin 10/30/2019, 5:14 PM

## 2019-10-30 NOTE — Progress Notes (Signed)
PROGRESS NOTE  Joshua [name] UXY:[id number] DOB: 1981/10/03 DOA: 09/09/2019 PCP: Patient, [name]  HPI/Recap of past 24 hours: Joshua [name] is a 38 y.o. M with no significant PMHx but apparently untreated severe HTN who presented with seizure-like activity, right-sided hemiparesis.  In the ER, Cr 2.29, K 2.4, CT head showed a large intraparenchymal hematoma and he was intubated.  Neurosurgery admitted the patient for emergent decompression.  Hospital course complicated by post CVA dysphagia post PEG tube placement, not tracking with his eyes, not following commands.  Speech assessment with recommendation for n.p.o.  10/30/19; patient was seen and examined at bedside this morning no acute events overnight.  No new changes.  Does not follow commands and does not track with his eyes.  PEG tube in place with feedings.    Assessment/Plan: Active Problems:   ICH (intracerebral hemorrhage) (HCC)   Subdural hematoma (HCC)   Intracerebral hemorrhage (HCC)   Cytotoxic brain edema (HCC)   Hemorrhagic stroke (HCC)   Acute respiratory failure (HCC)   Essential hypertension   Severe protein-calorie malnutrition (HCC)   CKD (chronic kidney disease), stage IV (HCC)   Seizure (HCC)  Large left intraparenchymal hemorrhage status post decompressive craniectomy with hematoma evacuation on 09/09/19 by Dr. Vertell Limber Aphasic PTOT recs SNF Speech recs NPO At the time of this visit does not track with his eyes and does not follow commands. Awaiting placement to SNF.  CSW assisting with placement.  Seizure disorder likely secondary to intracranial hemorrhage No recent seizure activity Continue Keppra  Aspiration pneumonia Resolved Completed 7 days of abx (zosyn>> unasyn) O2 saturation 100% on room air Labs and vital signs reviewed and are stable  Resolved transient uncontrolled hypertension Blood pressure is currently at goal Continue amlodipine 10 mg daily, 300 mg labetalol per tube 3 times daily  Continue to hold HCTZ due to recent AKI likely prerenal to avoid dehydration  Klebsiella oxytoca UTI Completed abx  Resolving AKI on CKD 3, likely prerenal in the setting of dehydration  Baseline cr 1.76 with GFR 55, at his baseline creatinine Creatinine 1.84 on 10/12/2019 Cr 1.59 with GFR >60 on 10/17/19. Creatinine 1.56 on 10/19/2019 with GFR greater than 60. 2.2 L recorded in the last 24 hours. Continue to avoid nephrotoxins Continue to hold off HCTZ Continue free water flushes to avoid dehydration  Severe physical debility/ambulatory dysfunction PT OT rec SNF CSW assisting with placement   DVT prophylaxis: Lovenox sq daily Code Status: FULL Family Communication:  None at bedside    Consultants:   Neurosurgery  Procedures:   9/12 intubation  9/12 craniotomy  9/18 extubation  9/18 re-intubation  9/23 extubation  10/1 EGD PEG placement    Antimicrobials:   Vancomycin 9/14 >> 9/15   Zosyn --> Unasyn 9/14 >> 9/20  Cefazolin --> Cephalexin 9/28 >> 10/2     Objective: Vitals:   10/30/19 0114 10/30/19 0347 10/30/19 0738 10/30/19 1235  BP:  (!) 131/99 (!) 125/91 111/87  Pulse:  87 89 93  Resp:  _0 Temp:  98.3 F (36.8 C) 98.2 F (36.8 C) 98.2 F (36.8 C)  TempSrc:   Oral Oral  SpO2:  100% 97% 99%  Weight: 60.1 kg     Height:        Intake/Output Summary (Last 24 hours) at 10/30/2019 1313 Last data filed at 10/30/2019 0347 Gross per 24 hour  Intake 10 ml  Output 2475 ml  Net -2465 ml   Filed Weights   10/27/19  6314 10/28/19 0411 10/30/19 0114  Weight: 62.2 kg 61.4 kg 60.1 kg    Exam:  General: 38 y.o. year-old male Frail in no acute distress.  Alert but does not follow commands and does not track with his eyes.  Cardiovascular: Regular rate and rhythm no rubs or gallops.  Respiratory: Clear to auscultation no wheezes no RALES.   Abdomen: Nontender nondistended normal bowel sounds present.  Musculoskeletal: No lower extremity  edema.  2/4 pulses in all 4 extremities.  Data Reviewed: CBC: No results for input(s): WBC, NEUTROABS, HGB, HCT, MCV, PLT in the last 168 hours. Basic Metabolic Panel: Recent Labs  Lab 10/26/19 0513  CREATININE 1.59*   GFR: Estimated Creatinine Clearance: 53 mL/min (A) (by C-G formula based on SCr of 1.59 mg/dL (H)). Liver Function Tests: No results for input(s): AST, ALT, ALKPHOS, BILITOT, PROT, ALBUMIN in the last 168 hours. No results for input(s): LIPASE, AMYLASE in the last 168 hours. No results for input(s): AMMONIA in the last 168 hours. Coagulation Profile: No results for input(s): INR, PROTIME in the last 168 hours. Cardiac Enzymes: No results for input(s): CKTOTAL, CKMB, CKMBINDEX, TROPONINI in the last 168 hours. BNP (last 3 results) No results for input(s): PROBNP in the last 8760 hours. HbA1C: No results for input(s): HGBA1C in the last 72 hours. CBG: Recent Labs  Lab 10/29/19 1657 10/29/19 2047 10/30/19 0342 10/30/19 0731 10/30/19 1231  GLUCAP 94 124* 108* 136* 105*   Lipid Profile: No results for input(s): CHOL, HDL, LDLCALC, TRIG, CHOLHDL, LDLDIRECT in the last 72 hours. Thyroid Function Tests: No results for input(s): TSH, T4TOTAL, FREET4, T3FREE, THYROIDAB in the last 72 hours. Anemia Panel: No results for input(s): VITAMINB12, FOLATE, FERRITIN, TIBC, IRON, RETICCTPCT in the last 72 hours. Urine analysis:    Component Value Date/Time   COLORURINE YELLOW (A) 09/23/2019 1108   APPEARANCEUR CLEAR (A) 09/23/2019 1108   LABSPEC 1.015 09/23/2019 1108   PHURINE 7.5 09/23/2019 1108   GLUCOSEU NEGATIVE 09/23/2019 1108   HGBUR NEGATIVE 09/23/2019 1108   [place] 09/23/2019 1108   [place] 09/23/2019 1108   PROTEINUR NEGATIVE 09/23/2019 1108   UROBILINOGEN 1.0 04/05/2009 0021   NITRITE NEGATIVE 09/23/2019 1108   LEUKOCYTESUR NEGATIVE 09/23/2019 1108   Sepsis Labs: _0 (procalcitonin:4,lacticidven:4)  )No results found for  this or any previous visit (from the past 240 hour(s)).    Studies: No results found.  Scheduled Meds: . alteplase  2 mg Intracatheter Once  . amLODipine  10 mg Per Tube Daily  . chlorhexidine gluconate (MEDLINE KIT)  15 mL Mouth Rinse BID  . enoxaparin (LOVENOX) injection  40 mg Subcutaneous Daily  . folic acid  1 mg Per Tube Daily  . free water  250 mL Per Tube Q4H  . insulin aspart  0-15 Units Subcutaneous Q4H  . labetalol  300 mg Per Tube TID  . levETIRAcetam  500 mg Per Tube BID  . lipase/protease/amylase)  20,880 Units Per Tube Once   And  . sodium bicarbonate  650 mg Per Tube Once  . mouth rinse  15 mL Mouth Rinse 10 times per day  . multivitamin  15 mL Per Tube Daily  . pantoprazole sodium  40 mg Per Tube BID  . QUEtiapine  50 mg Per Tube QHS  . sodium chloride flush  10-40 mL Intracatheter Q12H  . thiamine  100 mg Per Tube Daily    Continuous Infusions: . feeding supplement (OSMOLITE 1.5 CAL) 1,000 mL (10/29/19 2256)  LOS: 51 days     Kayleen Memos, MD [facility] Pager [phone]  If 7PM-7AM, please contact night-coverage [email or web address] Password [email or web address] 10/30/2019, 1:13 PM

## 2019-10-30 NOTE — Progress Notes (Signed)
Nutrition Follow-up  DOCUMENTATION CODES:   Not applicable  INTERVENTION:  - continue Osmolite 1.2 @ 70 ml/hr. - free water flush to continue to be per MD (currently 250 ml every 4 hours).    NUTRITION DIAGNOSIS:   Inadequate oral intake related to inability to eat as evidenced by NPO status. -ongoing  GOAL:   Patient will meet greater than or equal to 90% of their needs -met with TF regimen   MONITOR:   TF tolerance, Weight trends  ASSESSMENT:   38 yo male admitted with large left intracranial hemorrhage; S/P craniotomy. PMH includes HTN, alcoholism, noncompliance.  Significant Events: 9/12- L frontal temporal crani and evacuation of hematoma 9/23- extubation 10/1- PEG placement   Weight has been fairly stable over the past week and a half. Patient remains NPO with PEG in place. He is receiving Osmolite 1.5 @ 70 ml/hr with 250 ml free water every 4 hours. This regimen is providing 2520 kcal, 105 grams protein, and 2780 ml free water. Will continue to monitor weight trend closely.   Per notes: - speech recs NPO - not tracking with eyes and not following commands  - seizure disorder likely 2/2 intracranial hemorrhage  - aspiration PNA--resolved  - transient uncontrolled HTN--resolved  - UTI--completed abx - AKI on stage 3 CKD--resolving - severe physical debility/ambulatory dysfunction    Labs reviewed; CBGs: 108, 136, and 105 mg/dl. Medications reviewed; 1 mg folvite/day, sliding scale novolog, 15 ml liquid multivitamin/day, 100 mg thiamine/day.     Diet Order:   Diet Order            Diet NPO time specified  Diet effective midnight              EDUCATION NEEDS:   Not appropriate for education at this time  Skin:  Skin Assessment: Reviewed RN Assessment  Last BM:  10/31  Height:   Ht Readings from Last 1 Encounters:  09/09/19 '5\' 9"'  (1.753 m)    Weight:   Wt Readings from Last 1 Encounters:  10/30/19 60.1 kg    Ideal Body Weight:  72.7  kg  BMI:  Body mass index is 19.57 kg/m.  Estimated Nutritional Needs:   Kcal:  2100-2400  Protein:  105-125 grams  Fluid:  >/= 2 L/day     Jarome Matin, MS, RD, LDN, [facility] Clinical Dietitian Pager # [phone] After hours/weekend pager # [phone]

## 2019-10-31 LAB — GLUCOSE, CAPILLARY
Glucose-Capillary: 117 mg/dL — ABNORMAL HIGH (ref 70–99)
Glucose-Capillary: 133 mg/dL — ABNORMAL HIGH (ref 70–99)
Glucose-Capillary: 111 mg/dL — ABNORMAL HIGH (ref 70–99)
Glucose-Capillary: 119 mg/dL — ABNORMAL HIGH (ref 70–99)
Glucose-Capillary: 107 mg/dL — ABNORMAL HIGH (ref 70–99)
Glucose-Capillary: 96 mg/dL (ref 70–99)

## 2019-10-31 NOTE — Progress Notes (Signed)
PROGRESS NOTE  Bud Kaeser [id number] DOB: [birth year] DOA: 09/09/2019 PCP: Patient, [name]  HPI/Recap of past 24 hours: Mr. Santillanes is a 38 y.o. M with no significant PMHx but apparently untreated severe HTN who presented with seizure-like activity, right-sided hemiparesis.  In the ER, Cr 2.29, K 2.4, CT head showed a large intraparenchymal hematoma and he was intubated.  Neurosurgery admitted the patient for emergent decompression.  Hospital course complicated by post CVA dysphagia post PEG tube placement, not tracking with his eyes, not following commands.  Speech assessment with recommendation for n.p.o.  10/31/19: Patient was seen and examined at his bedside this morning.  No acute events overnight.  He does not follow commands and does not track with his eyes.  PEG tube in place with feedings.  CSW assisting with difficult placement.    Assessment/Plan: Active Problems:   ICH (intracerebral hemorrhage) (HCC)   Subdural hematoma (HCC)   Intracerebral hemorrhage (HCC)   Cytotoxic brain edema (HCC)   Hemorrhagic stroke (HCC)   Acute respiratory failure (HCC)   Essential hypertension   Severe protein-calorie malnutrition (HCC)   CKD (chronic kidney disease), stage IV (HCC)   Seizure (HCC)  Large left intraparenchymal hemorrhage status post decompressive craniectomy with hematoma evacuation on 09/09/19 by Dr. Vertell Limber Aphasic PTOT recs SNF Speech recs NPO At the time of this visit does not track with his eyes and does not follow commands. Awaiting placement to SNF.  CSW assisting with placement.  Seizure disorder likely secondary to intracranial hemorrhage No recent seizure activity Continue Keppra  Aspiration pneumonia Resolved Completed 7 days of abx (zosyn>> unasyn) O2 saturation 100% on room air Labs and vital signs reviewed and are stable  Resolved transient uncontrolled hypertension Blood pressure is currently at goal Continue amlodipine 10 mg daily, 300 mg  labetalol per tube 3 times daily Continue to hold HCTZ due to recent AKI likely prerenal to avoid dehydration  Klebsiella oxytoca UTI Completed abx  Resolving AKI on CKD 3, likely prerenal in the setting of dehydration  Baseline cr 1.76 with GFR 55, at his baseline creatinine Creatinine 1.84 on 10/12/2019 Cr 1.59 with GFR >60 on 10/17/19. Creatinine 1.56 on 10/19/2019 with GFR greater than 60. 2.2 L recorded in the last 24 hours. Continue to avoid nephrotoxins Continue to hold off HCTZ Continue free water flushes to avoid dehydration Repeat BMP in the morning  Severe physical debility/ambulatory dysfunction PT OT rec SNF CSW assisting with placement  Chronic normocytic anemia Hemoglobin stable No sign of bleeding Repeat CBC in the morning   DVT prophylaxis: Lovenox sq daily Code Status: FULL Family Communication:  None at bedside    Consultants:   Neurosurgery  Procedures:   9/12 intubation  9/12 craniotomy  9/18 extubation  9/18 re-intubation  9/23 extubation  10/1 EGD PEG placement    Antimicrobials:   Vancomycin 9/14 >> 9/15   Zosyn --> Unasyn 9/14 >> 9/20  Cefazolin --> Cephalexin 9/28 >> 10/2     Objective: Vitals:   10/30/19 2342 10/31/19 0336 10/31/19 0817 10/31/19 1156  BP: 116/90 (!) 129/97 (!) 145/104 122/89  Pulse: 76 90 84 94  Resp: '16 14 17 17  ' Temp: (!) 97.5 F (36.4 C) 97.8 F (36.6 C) (!) 97.5 F (36.4 C) (!) 97.5 F (36.4 C)  TempSrc: Oral Oral Oral Oral  SpO2: 100% 96% 100% 100%  Weight:      Height:        Intake/Output Summary (Last 24 hours) at 10/31/2019  [place] filed at 10/31/2019 1159 Gross per 24 hour  Intake 811 ml  Output 1800 ml  Net -989 ml   Filed Weights   10/27/19 0204 10/28/19 0411 10/30/19 0114  Weight: 62.2 kg 61.4 kg 60.1 kg    Exam:  General: 38 y.o. year-old male frail-appearing in no acute distress.  Alert but does not follow commands and does not track with his eyes.   Cardiovascular: Regular rate and rhythm no rubs or gallops.  Respiratory: Clear to auscultation no wheezes no rales.   Abdomen: Nontender normal bowel sounds present.  PEG tube in place.   Musculoskeletal: No lower extremity edema.  Total 4 pulses in all 4.    Data Reviewed: CBC: No results for input(s): WBC, NEUTROABS, HGB, HCT, MCV, PLT in the last 168 hours. Basic Metabolic Panel: Recent Labs  Lab 10/26/19 0513  CREATININE 1.59*   GFR: Estimated Creatinine Clearance: 53 mL/min (A) (by C-G formula based on SCr of 1.59 mg/dL (H)). Liver Function Tests: No results for input(s): AST, ALT, ALKPHOS, BILITOT, PROT, ALBUMIN in the last 168 hours. No results for input(s): LIPASE, AMYLASE in the last 168 hours. No results for input(s): AMMONIA in the last 168 hours. Coagulation Profile: No results for input(s): INR, PROTIME in the last 168 hours. Cardiac Enzymes: No results for input(s): CKTOTAL, CKMB, CKMBINDEX, TROPONINI in the last 168 hours. BNP (last 3 results) No results for input(s): PROBNP in the last 8760 hours. HbA1C: No results for input(s): HGBA1C in the last 72 hours. CBG: Recent Labs  Lab 10/30/19 1958 10/30/19 2300 10/31/19 0333 10/31/19 0818 10/31/19 1154  GLUCAP 128* 81 117* 133* 111*   Lipid Profile: No results for input(s): CHOL, HDL, LDLCALC, TRIG, CHOLHDL, LDLDIRECT in the last 72 hours. Thyroid Function Tests: No results for input(s): TSH, T4TOTAL, FREET4, T3FREE, THYROIDAB in the last 72 hours. Anemia Panel: No results for input(s): VITAMINB12, FOLATE, FERRITIN, TIBC, IRON, RETICCTPCT in the last 72 hours. Urine analysis:    Component Value Date/Time   COLORURINE YELLOW (A) 09/23/2019 1108   APPEARANCEUR CLEAR (A) 09/23/2019 1108   LABSPEC 1.015 09/23/2019 1108   PHURINE 7.5 09/23/2019 1108   GLUCOSEU NEGATIVE 09/23/2019 1108   HGBUR NEGATIVE 09/23/2019 1108   [place] 09/23/2019 1108   [place] 09/23/2019 1108   PROTEINUR  NEGATIVE 09/23/2019 1108   UROBILINOGEN 1.0 04/05/2009 0021   NITRITE NEGATIVE 09/23/2019 1108   LEUKOCYTESUR NEGATIVE 09/23/2019 1108   Sepsis Labs: '@LABRCNTIP' (procalcitonin:4,lacticidven:4)  )No results found for this or any previous visit (from the past 240 hour(s)).    Studies: No results found.  Scheduled Meds: . alteplase  2 mg Intracatheter Once  . amLODipine  10 mg Per Tube Daily  . chlorhexidine gluconate (MEDLINE KIT)  15 mL Mouth Rinse BID  . enoxaparin (LOVENOX) injection  40 mg Subcutaneous Daily  . folic acid  1 mg Per Tube Daily  . free water  250 mL Per Tube Q4H  . insulin aspart  0-15 Units Subcutaneous Q4H  . labetalol  300 mg Per Tube TID  . levETIRAcetam  500 mg Per Tube BID  . lipase/protease/amylase)  20,880 Units Per Tube Once   And  . sodium bicarbonate  650 mg Per Tube Once  . mouth rinse  15 mL Mouth Rinse 10 times per day  . multivitamin  15 mL Per Tube Daily  . pantoprazole sodium  40 mg Per Tube BID  . QUEtiapine  50 mg Per Tube QHS  .  sodium chloride flush  10-40 mL Intracatheter Q12H  . thiamine  100 mg Per Tube Daily    Continuous Infusions: . feeding supplement (OSMOLITE 1.5 CAL) 1,000 mL (10/31/19 0818)     LOS: 44 days     Kayleen Memos, MD [facility] Pager [phone]  If 7PM-7AM, please contact night-coverage [email or web address] Password [email or web address] 10/31/2019, 3:25 PM

## 2019-10-31 NOTE — Progress Notes (Signed)
PT Cancellation Note  Patient Details Name: Joshua [name] MRN: [id number] DOB: 1981-07-20   Cancelled Treatment:    Reason Eval/Treat Not Completed: Other (comment)  Attempted to see patient for PT tx. Pt actively resisting all attempts to mobilize at this time. PT will continue to follow acutely.    Earney Navy, PTA Acute Rehabilitation Services Pager: [phone] Office: [phone]   10/31/2019, 3:44 PM

## 2019-11-01 LAB — GLUCOSE, CAPILLARY
Glucose-Capillary: 103 mg/dL — ABNORMAL HIGH (ref 70–99)
Glucose-Capillary: 111 mg/dL — ABNORMAL HIGH (ref 70–99)
Glucose-Capillary: 112 mg/dL — ABNORMAL HIGH (ref 70–99)
Glucose-Capillary: 114 mg/dL — ABNORMAL HIGH (ref 70–99)
Glucose-Capillary: 118 mg/dL — ABNORMAL HIGH (ref 70–99)
Glucose-Capillary: 123 mg/dL — ABNORMAL HIGH (ref 70–99)

## 2019-11-01 LAB — CBC
HCT: 33.1 % — ABNORMAL LOW (ref 39.0–52.0)
Hemoglobin: 10.6 g/dL — ABNORMAL LOW (ref 13.0–17.0)
MCH: 27.9 pg (ref 26.0–34.0)
MCHC: 32 g/dL (ref 30.0–36.0)
MCV: 87.1 fL (ref 80.0–100.0)
Platelets: 316 10*3/uL (ref 150–400)
RBC: 3.8 MIL/uL — ABNORMAL LOW (ref 4.22–5.81)
RDW: 13 % (ref 11.5–15.5)
WBC: 5.9 10*3/uL (ref 4.0–10.5)
nRBC: 0 % (ref 0.0–0.2)

## 2019-11-01 LAB — BASIC METABOLIC PANEL
Anion gap: 12 (ref 5–15)
BUN: 30 mg/dL — ABNORMAL HIGH (ref 6–20)
CO2: 25 mmol/L (ref 22–32)
Calcium: 10 mg/dL (ref 8.9–10.3)
Chloride: 100 mmol/L (ref 98–111)
Creatinine, Ser: 1.34 mg/dL — ABNORMAL HIGH (ref 0.61–1.24)
GFR calc Af Amer: >60 mL/min (ref >60)
GFR calc non Af Amer: >60 mL/min (ref >60)
Glucose, Bld: 123 mg/dL — ABNORMAL HIGH (ref 70–99)
Potassium: 4.2 mmol/L (ref 3.5–5.1)
Sodium: 137 mmol/L (ref 135–145)

## 2019-11-01 NOTE — Progress Notes (Signed)
Physical Therapy Treatment Patient Details Name: Joshua [name] MRN: [id number] DOB: October 03, 1981 Today's Date: 11/01/2019    History of Present Illness Pt is a 38 y.o. 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 S/p L frontoparietal craniectomy for evacuation of L basal ganglia hematoma 9/12. ETT 9/12-9/18, 9/18-9/23. PEG tube placed 9/28 secondary to dysphagia. PMH includes alcohol abuse, seizures, HTN.   PT Comments    Pt minimally attending during session today, requiring totalA for limited mobility, purposeful movement with LUE to resist RUE/RLE PROM and positioning. PROM and positioning to encourage extension and neutral posture, including cervical rotation. PT to see if possible for pt to use tilt-n-space wheelchair to allow for more upright posture throughout day with safety alarm belt.   Follow Up Recommendations  SNF     Equipment Recommendations  Wheelchair (measurements PT);Wheelchair cushion (measurements PT);Hospital bed;3in1 (PT)    Recommendations for Other Services       Precautions / Restrictions Precautions Precautions: Fall Precaution Comments: L crani, PEG, L hand soft mitt Restrictions Weight Bearing Restrictions: No    Mobility  Bed Mobility Overal bed mobility: Needs Assistance Bed Mobility: Rolling           General bed mobility comments: Pt actively resisting rolling, requiring totalA+2 to roll towards R-side to check pad placement and if soiled  Transfers                    Ambulation/Gait                 Stairs             Wheelchair Mobility    Modified Rankin (Stroke Patients Only)       Balance                                            Cognition Arousal/Alertness: Awake/alert Behavior During Therapy: Flat affect Overall Cognitive Status: Difficult to assess                                 General Comments: Pt opening eyes to  attend to therapist upon walking in, after this making minimal eye contact, not following simple commands. Purposeful movement with LUE to resist movement      Exercises Other Exercises Other Exercises: Passive ROM to RUE, especially R bicep tone with elbow extension; pt resistant to RLE PROM, BLEs positioned to reduce pressure through knees and ankle contact; cervical stretch with head positioned to encourage neutral rotation    General Comments        Pertinent Vitals/Pain Pain Assessment: Faces Faces Pain Scale: Hurts little more Pain Location: Passive R elbow flexion and shoulder ROM, neck with L rotation Pain Descriptors / Indicators: Grimacing;Guarding Pain Intervention(s): Monitored during session;Repositioned    Home Living                      Prior Function            PT Goals (current goals can now be found in the care plan section) Acute Rehab PT Goals Patient Stated Goal: none stated PT Goal Formulation: Patient unable to participate in goal setting Time For Goal Achievement: 11/15/19 Potential to Achieve Goals: Fair Progress towards PT goals: Not progressing toward  goals - comment(Limited participation)    Frequency    Min 2X/week      PT Plan Current plan remains appropriate    Co-evaluation              AM-PAC PT "6 Clicks" Mobility   Outcome Measure  Help needed turning from your back to your side while in a flat bed without using bedrails?: Total Help needed moving from lying on your back to sitting on the side of a flat bed without using bedrails?: Total Help needed moving to and from a bed to a chair (including a wheelchair)?: Total Help needed standing up from a chair using your arms (e.g., wheelchair or bedside chair)?: Total Help needed to walk in hospital room?: Total Help needed climbing 3-5 steps with a railing? : Total 6 Click Score: 6    End of Session   Activity Tolerance: Other (comment) Patient left: in bed;with  call bell/phone within reach;with bed alarm set;with restraints reapplied Nurse Communication: Mobility status PT Visit Diagnosis: Muscle weakness (generalized) (M62.81);Difficulty in walking, not elsewhere classified (R26.2);Hemiplegia and hemiparesis Hemiplegia - Right/Left: Right     Time: [id number] PT Time Calculation (min) (ACUTE ONLY): 15 min  Charges:  $Therapeutic Exercise: 8-22 mins                    Mabeline Caras, PT, DPT Acute Rehabilitation Services  Pager [phone] Office [place] 11/01/2019, 5:30 PM

## 2019-11-01 NOTE — Progress Notes (Signed)
Hospitalist progress note  If 7PM-7AM,  night-coverage-look on AMION -prefer pages-not epic chat,please  Gar Glance  [id number] DOB: [birth year] DOA: 09/09/2019 PCP: Patient, [name]  Narrative:  38 year old male [place] 09[place]09/[place] smoke [place] having had a seizure incontinent bowel and bladder Came to the emergency room and was seen and found to have a large left hemispheric intracranial hemorrhage was rapidly declining neurological examination-left pupil and was not conscious nor following commands he went to surgery for left craniotomy decompressive craniectomy and evacuation of ICH  had a PEG tube placed 9/28 secondary to dysphagia he does not follow commands and has been n.p.o.  Assessment & Plan: Large left intraparenchymal hemorrhage status post decompressive craniectomy Dr. Vertell Limber 08/2011  Seizures secondary to above Continuing supportive management-also continue Keppra 500 twice daily Aspiration pneumonia In the setting of PEG tube continue feeding supplements 70 cc/h multivitamin liquid as well as free water to 50 every 4 Was treated for 7 days with antibiotics through 10/2 CKD 4 BUNs/creatinine is down to 30/1.3 from admission to range Continue free water flushes and feeds HTN Poorly controlled on amlodipine 10-we will add medications in the next 24 hours if still elevated Probable alcohol withdrawal No signs or symptoms of withdrawal at this time Severe protein calorie malnutrition  DVT prophylaxis: Lovenox  Code Status:   Full   Family Communication:   None Disposition Plan: Inpatient  Consultants:   Neurology Procedures:   None Antimicrobials:   No Subjective: Awake alert but actively resisting any attempts at therapy does not talk to this examiner and is essentially not responsive and seems angry Objective: Vitals:   10/31/19 2323 11/01/19 0339 11/01/19 0818 11/01/19 1219  BP: 113/82 (!) 127/100 (!) 132/100 (!) 124/100  Pulse: 79 78 83 91  Resp:  '17 17 16 16  ' Temp: 98.1 F (36.7 C) 97.9 F (36.6 C) 97.7 F (36.5 C) 97.8 F (36.6 C)  TempSrc: Oral Oral Oral Oral  SpO2: 100% 100%  100%  Weight:      Height:        Intake/Output Summary (Last 24 hours) at 11/01/2019 1326 Last data filed at 11/01/2019 0600 Gross per 24 hour  Intake 310 ml  Output 1600 ml  Net -1290 ml   Filed Weights   10/27/19 0204 10/28/19 0411 10/30/19 0114  Weight: 62.2 kg 61.4 kg 60.1 kg    Examination: EOMI NCAT no focal deficit S1-S2 no murmur rub or gallop CTA B no added sound Abdomen soft PEG tube in place No lower extremity edema cannot assess motor and patient not responsive for sensory  Data Reviewed: I have personally reviewed following labs and imaging studies CBC: Recent Labs  Lab 11/01/19 0312  WBC 5.9  HGB 10.6*  HCT 33.1*  MCV 87.1  PLT 211   Basic Metabolic Panel: Recent Labs  Lab 10/26/19 0513 11/01/19 0312  NA  --  137  K  --  4.2  CL  --  100  CO2  --  25  GLUCOSE  --  123*  BUN  --  30*  CREATININE 1.59* 1.34*  CALCIUM  --  10.0   GFR: Estimated Creatinine Clearance: 62.9 mL/min (A) (by C-G formula based on SCr of 1.34 mg/dL (H)). Liver Function Tests: No results for input(s): AST, ALT, ALKPHOS, BILITOT, PROT, ALBUMIN in the last 168 hours. No results for input(s): LIPASE, AMYLASE in the last 168 hours. No results for input(s): AMMONIA in the last 168 hours. Coagulation Profile: No  results for input(s): INR, PROTIME in the last 168 hours. Cardiac Enzymes: Radiology Studies: Reviewed images personally in health database  Scheduled Meds: . alteplase  2 mg Intracatheter Once  . amLODipine  10 mg Per Tube Daily  . chlorhexidine gluconate (MEDLINE KIT)  15 mL Mouth Rinse BID  . enoxaparin (LOVENOX) injection  40 mg Subcutaneous Daily  . folic acid  1 mg Per Tube Daily  . free water  250 mL Per Tube Q4H  . insulin aspart  0-15 Units Subcutaneous Q4H  . labetalol  300 mg Per Tube TID  . levETIRAcetam  500  mg Per Tube BID  . lipase/protease/amylase)  20,880 Units Per Tube Once   And  . sodium bicarbonate  650 mg Per Tube Once  . mouth rinse  15 mL Mouth Rinse 10 times per day  . multivitamin  15 mL Per Tube Daily  . pantoprazole sodium  40 mg Per Tube BID  . QUEtiapine  50 mg Per Tube QHS  . sodium chloride flush  10-40 mL Intracatheter Q12H  . thiamine  100 mg Per Tube Daily   Continuous Infusions: . feeding supplement (OSMOLITE 1.5 CAL) 1,000 mL (10/31/19 0818)    LOS: 53 days   Time spent: [place], MD [facility]  11/01/2019, 1:26 PM

## 2019-11-01 NOTE — Progress Notes (Addendum)
  Speech Language Pathology Treatment - DISCHARGE   Dysphagia;Cognitive-Linquistic  Patient Details Name: Joshua [name] MRN: [id number] DOB: 1981/08/05 Today's Date: 11/01/2019 Time: [id number] SLP Time Calculation (min) (ACUTE ONLY): 10 min  Assessment / Plan / Recommendation Clinical Impression  Pt was seen at bedside for skilled ST intervention targeting goals for po readiness, cog/com treatment. Pt was awake and alert, nonvocal, does not follow directions. SLP attempted oral care with and without suction. Pt did not open his mouth, and swatted/grabbed at my hand with his left hand. PO trials of ice chips and ice cream were also attempted, with similar response from pt. Pt has continually demonstrated unwillingness to participate in treatment sessions, and is currently inappropriate for continued skilled ST intervention. ST will sign off at this time. Please reconsult if pt demonstrates willingness to participate in therapy.     HPI HPI: Pt is a 38 year old male who presented with new seizure, and was found to have a large left BG intracranial hemorrhage s/p L frontal temporal craniectomy with evacuation 9/12. ETT 9/12-9/18; 9/18-9/23. PMH: HTN, alcoholism, noncompliance      SLP Plan  Discontinue skilled ST intervention. Request reconsult when pt demonstrates willingness to participate in skilled therapy sessions.       Recommendations  Diet recommendations: NPO Medication Administration: Via alternative means                Oral Care Recommendations: Oral care QID;Staff/trained caregiver to provide oral care Follow up Recommendations: 24 hour supervision/assistance;Skilled Nursing facility SLP Visit Diagnosis: Dysphagia, unspecified (R13.10);Cognitive communication deficit [id number]) Plan: Continue with current plan of care       [place], [facility], [place] Speech Language Pathologist Office: [phone] Pager: [phone]  [name] 11/01/2019, 2:27 PM

## 2019-11-02 LAB — GLUCOSE, CAPILLARY
Glucose-Capillary: 96 mg/dL (ref 70–99)
Glucose-Capillary: 112 mg/dL — ABNORMAL HIGH (ref 70–99)
Glucose-Capillary: 111 mg/dL — ABNORMAL HIGH (ref 70–99)
Glucose-Capillary: 98 mg/dL (ref 70–99)
Glucose-Capillary: 111 mg/dL — ABNORMAL HIGH (ref 70–99)

## 2019-11-02 LAB — CREATININE, SERUM
Creatinine, Ser: 1.46 mg/dL — ABNORMAL HIGH (ref 0.61–1.24)
GFR calc Af Amer: >60 mL/min (ref >60)
GFR calc non Af Amer: 60 mL/min — ABNORMAL LOW (ref >60)

## 2019-11-02 NOTE — Progress Notes (Signed)
Hospitalist progress note  If 7PM-7AM,  night-coverage-look on AMION -prefer pages-not epic chat,please  Joshua [name]  [id number] DOB: 1981/01/24 DOA: 09/09/2019 PCP: Patient, [name]  Narrative:  38 year old male admitted 09[place]09/[place] shop having had a seizure incontinent bowel and bladder Came to the emergency room and was seen and found to have a large left hemispheric intracranial hemorrhage was rapidly declining neurological examination-left pupil and was not conscious nor following commands he went to surgery for left craniotomy decompressive craniectomy and evacuation of ICH  had a PEG tube placed 9/28 secondary to dysphagia he does not follow commands and has been n.p.o.  Assessment & Plan: Large left intraparenchymal hemorrhage status post decompressive craniectomy Dr. Vertell Limber 08/2011  Seizures secondary to above Continuing supportive management-also continue Keppra 500 twice daily Currently is aphasic and resistive of most attempts at therapy Aspiration pneumonia In the setting of PEG tube continue feeding supplements 70 cc/h multivitamin liquid as well as free water to 50 every 4 Was treated for 7 days with antibiotics through 10/2 Stable at this time CKD 4 Rpt labs in am HTN Poorly controlled on amlodipine 10-we will add medications in the next 24 hours if still elevated Probable alcohol withdrawal No signs or symptoms of withdrawal at this time Severe protein calorie malnutrition  DVT prophylaxis: Lovenox  Code Status:   Full   Family Communication:   None Disposition Plan: Inpatient  Consultants:   Neurology Procedures:   None Antimicrobials:   No Subjective:  Sleeping today-I did not awaken No reports from Rn of issues  Objective: Vitals:   11/01/19 2340 11/02/19 0407 11/02/19 0754 11/02/19 1143  BP: (!) 121/95 (!) 131/100 (!) 125/112 116/90  Pulse: 80 83 79 92  Resp: _0 Temp: 97.8 F (36.6 C) 98 F (36.7 C) 97.6 F (36.4 C)  97.8 F (36.6 C)  TempSrc: Oral Oral Oral Oral  SpO2:  100% 100% 100%  Weight:      Height:       No intake or output data in the 24 hours ending 11/02/19 1431 Filed Weights   10/27/19 0204 10/28/19 0411 10/30/19 0114  Weight: 62.2 kg 61.4 kg 60.1 kg    Examination:  eomi ncat Sleepy  s1 s2 no m/r/g abd soft  Data Reviewed: I have personally reviewed following labs and imaging studies CBC: Recent Labs  Lab 11/01/19 0312  WBC 5.9  HGB 10.6*  HCT 33.1*  MCV 87.1  PLT 361   Basic Metabolic Panel: Recent Labs  Lab 11/01/19 0312 11/02/19 0458  NA 137  --   K 4.2  --   CL 100  --   CO2 25  --   GLUCOSE 123*  --   BUN 30*  --   CREATININE 1.34* 1.46*  CALCIUM 10.0  --    GFR: Estimated Creatinine Clearance: 57.7 mL/min (A) (by C-G formula based on SCr of 1.46 mg/dL (H)). Liver Function Tests: No results for input(s): AST, ALT, ALKPHOS, BILITOT, PROT, ALBUMIN in the last 168 hours. No results for input(s): LIPASE, AMYLASE in the last 168 hours. No results for input(s): AMMONIA in the last 168 hours. Coagulation Profile: No results for input(s): INR, PROTIME in the last 168 hours. Cardiac Enzymes: Radiology Studies: Reviewed images personally in health database  Scheduled Meds: . alteplase  2 mg Intracatheter Once  . amLODipine  10 mg Per Tube Daily  . chlorhexidine gluconate (MEDLINE KIT)  15 mL Mouth Rinse BID  .  enoxaparin (LOVENOX) injection  40 mg Subcutaneous Daily  . folic acid  1 mg Per Tube Daily  . free water  250 mL Per Tube Q4H  . insulin aspart  0-15 Units Subcutaneous Q4H  . labetalol  300 mg Per Tube TID  . levETIRAcetam  500 mg Per Tube BID  . lipase/protease/amylase)  20,880 Units Per Tube Once   And  . sodium bicarbonate  650 mg Per Tube Once  . mouth rinse  15 mL Mouth Rinse 10 times per day  . multivitamin  15 mL Per Tube Daily  . pantoprazole sodium  40 mg Per Tube BID  . QUEtiapine  50 mg Per Tube QHS  . sodium chloride flush   10-40 mL Intracatheter Q12H  . thiamine  100 mg Per Tube Daily   Continuous Infusions: . feeding supplement (OSMOLITE 1.5 CAL) 1,000 mL (11/02/19 1043)    LOS: 54 days   Time spent: [place], MD [facility]  11/02/2019, 2:31 PM

## 2019-11-03 LAB — GLUCOSE, CAPILLARY
Glucose-Capillary: 100 mg/dL — ABNORMAL HIGH (ref 70–99)
Glucose-Capillary: 113 mg/dL — ABNORMAL HIGH (ref 70–99)
Glucose-Capillary: 118 mg/dL — ABNORMAL HIGH (ref 70–99)
Glucose-Capillary: 123 mg/dL — ABNORMAL HIGH (ref 70–99)
Glucose-Capillary: 100 mg/dL — ABNORMAL HIGH (ref 70–99)
Glucose-Capillary: 111 mg/dL — ABNORMAL HIGH (ref 70–99)

## 2019-11-03 MED ORDER — METOPROLOL TARTRATE 50 MG PO TABS
100.0000 mg | ORAL_TABLET | Freq: Two times a day (BID) | ORAL | Status: DC
  Administered 2019-11-03: 100 mg via ORAL
  Filled 2019-11-03 (×2): qty 2

## 2019-11-03 MED ORDER — METOPROLOL TARTRATE 50 MG PO TABS
100.0000 mg | ORAL_TABLET | Freq: Two times a day (BID) | ORAL | Status: DC
  Administered 2019-11-03 – 2020-01-07 (×126): 100 mg
  Filled 2019-11-03 (×38): qty 2
  Filled 2019-11-03: qty 4
  Filled 2019-11-03 (×25): qty 2
  Filled 2019-11-03: qty 4
  Filled 2019-11-03 (×67): qty 2

## 2019-11-03 NOTE — Progress Notes (Signed)
Physical Therapy Treatment Patient Details Name: Joshua [name] MRN: [id number] DOB: 1981/09/01 Today's Date: 11/03/2019    History of Present Illness Pt is a 38 y.o. 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 S/p L frontoparietal craniectomy for evacuation of L basal ganglia hematoma 9/12. ETT 9/12-9/18, 9/18-9/23. PEG tube placed 9/28 secondary to dysphagia. PMH includes alcohol abuse, seizures, HTN.    PT Comments    Patient progressing with sitting up OOB in tilt in space w/c.  Able to demonstrate good head control despite leaning to L onto bed when chair close enough.  Still with significant tone limiting mobility and pt with limited tolerance.  Hopeful sitting upright may help orientation and decrease his tendency to push back when getting him sitting upright.  PT to follow.  Continue to recommend SNF.    Follow Up Recommendations  SNF     Equipment Recommendations  Wheelchair (measurements PT);Wheelchair cushion (measurements PT);Hospital bed;3in1 (PT)    Recommendations for Other Services       Precautions / Restrictions Precautions Precaution Comments: L crani, PEG, L hand soft mitt Required Braces or Orthoses: Splint/Cast Splint/Cast: resting hand splint and elbow splint for L UE due to contracture    Mobility  Bed Mobility Overal bed mobility: Needs Assistance Bed Mobility: Rolling Rolling: +2 for physical assistance;Max assist   Supine to sit: Total assist     General bed mobility comments: rolling for hygiene as soiled in bed.  Assisted slightly to stay on one side; assist to sit up with +1 total A; pt rigid and stiff in hips/pelvis  Transfers Overall transfer level: Needs assistance   Transfers: Lateral/Scoot Transfers          Lateral/Scoot Transfers: +2 physical assistance;Max assist General transfer comment: scooting to TIS w/c with pad under pt  Ambulation/Gait                 Stairs              Wheelchair Mobility    Modified Rankin (Stroke Patients Only) Modified Rankin (Stroke Patients Only) Pre-Morbid Rankin Score: No symptoms Modified Rankin: Severe disability     Balance Overall balance assessment: Needs assistance Sitting-balance support: Feet supported;Single extremity supported Sitting balance-Leahy Scale: Poor Sitting balance - Comments: Seated EOB assist for tone reduction with trunk rotation. forward flexion to break up tone, pt not tolerating well; seated EOB with S for a little with pt leaning down on L elbow                                    Cognition Arousal/Alertness: Awake/alert Behavior During Therapy: Flat affect Overall Cognitive Status: Impaired/Different from baseline Area of Impairment: Attention;Safety/judgement;Awareness;Following commands;Memory;Problem solving                   Current Attention Level: Focused Memory: Decreased short-term memory Following Commands: Follows multi-step commands inconsistently Safety/Judgement: Decreased awareness of deficits;Decreased awareness of safety Awareness: Intellectual Problem Solving: Slow processing;Decreased initiation;Difficulty sequencing;Requires verbal cues;Requires tactile cues        Exercises      General Comments        Pertinent Vitals/Pain Faces Pain Scale: Hurts little more Pain Location: Passive R elbow flexion and shoulder ROM, neck with L rotation Pain Descriptors / Indicators: Grimacing;Other (Comment)(pushing my hand away) Pain Intervention(s): Monitored during session;Repositioned    Home Living  Prior Function            PT Goals (current goals can now be found in the care plan section) Progress towards PT goals: Progressing toward goals    Frequency    Min 2X/week      PT Plan Current plan remains appropriate    Co-evaluation              AM-PAC PT "6 Clicks" Mobility   Outcome  Measure  Help needed turning from your back to your side while in a flat bed without using bedrails?: Total Help needed moving from lying on your back to sitting on the side of a flat bed without using bedrails?: Total Help needed moving to and from a bed to a chair (including a wheelchair)?: Total Help needed standing up from a chair using your arms (e.g., wheelchair or bedside chair)?: Total Help needed to walk in hospital room?: Total Help needed climbing 3-5 steps with a railing? : Total 6 Click Score: 6    End of Session   Activity Tolerance: Patient tolerated treatment well Patient left: in chair;with call bell/phone within reach;with chair alarm set;with nursing/sitter in room Nurse Communication: Mobility status PT Visit Diagnosis: Muscle weakness (generalized) (M62.81);Difficulty in walking, not elsewhere classified (R26.2);Hemiplegia and hemiparesis Hemiplegia - Right/Left: Right Hemiplegia - dominant/non-dominant: Dominant Hemiplegia - caused by: Nontraumatic intracerebral hemorrhage     Time: 1500-1544 PT Time Calculation (min) (ACUTE ONLY): 44 min  Charges:  $Therapeutic Activity: 23-37 mins $Neuromuscular Re-education: 8-22 mins                     [name], [place] Acute Rehabilitation Services [phone] 11/03/2019    [name] 11/03/2019, 5:09 PM

## 2019-11-03 NOTE — Progress Notes (Signed)
Hospitalist progress note  If 7PM-7AM,  night-coverage-look on AMION -prefer pages-not epic chat,please  Joshua [name]  GMW:[id number] DOB: 1981-05-06 DOA: 09/09/2019 PCP: Patient, [name]  Narrative:  38 year old male [place]09[place] having had a seizure incontinent bowel and bladder Came to the emergency room and was seen and found to have a large left hemispheric intracranial hemorrhage was rapidly declining neurological examination-left pupil and was not conscious nor following commands he went to surgery for left craniotomy decompressive craniectomy and evacuation of ICH  had a PEG tube placed 9/28 secondary to dysphagia he does not follow commands and has been n.p.o.  Assessment & Plan: Large left intraparenchymal hemorrhage status post decompressive craniectomy Dr. Vertell Limber 08/2011  Seizures secondary to above Continuing supportive management-also continue Keppra 500 twice daily aphasic and resistive of most attempts at therapy Aspiration pneumonia In the setting of PEG tube continue feeding supplements 70 cc/h multivitamin liquid as well as free water 250 every 4 Was treated for 7 days with antibiotics through 10/2 Stable at this time CKD 4 Rpt labs periodically HTN Poorly controlled on amlodipine 10 mg--change labetalol to 100 bid Probable alcohol withdrawal No signs or symptoms of withdrawal at this time Severe protein calorie malnutrition  DVT prophylaxis: Lovenox  Code Status:   Full   Family Communication:   None Disposition Plan: Inpatient  Consultants:   Neurology Procedures:   None Antimicrobials:   No Subjective:  Awake restless occasionally responding to nursing In R UE splint today  Objective: Vitals:   11/03/19 0419 11/03/19 0800 11/03/19 0939 11/03/19 1210  BP:   (!) 139/96 (!) 133/99  Pulse:   86 88  Resp:   19 15  Temp:   97.6 F (36.4 C) 97.8 F (36.6 C)  TempSrc:   Oral Oral  SpO2:  100% 100% 100%  Weight: 60.8 kg      Height:        Intake/Output Summary (Last 24 hours) at 11/03/2019 1457 Last data filed at 11/03/2019 0600 Gross per 24 hour  Intake 930 ml  Output 1925 ml  Net -995 ml   Filed Weights   10/28/19 0411 10/30/19 0114 11/03/19 0419  Weight: 61.4 kg 60.1 kg 60.8 kg    Examination:  eomi ncat s1 s2 no m/r/g abd soft  Data Reviewed: I have personally reviewed following labs and imaging studies CBC: Recent Labs  Lab 11/01/19 0312  WBC 5.9  HGB 10.6*  HCT 33.1*  MCV 87.1  PLT 440   Basic Metabolic Panel: Recent Labs  Lab 11/01/19 0312 11/02/19 0458  NA 137  --   K 4.2  --   CL 100  --   CO2 25  --   GLUCOSE 123*  --   BUN 30*  --   CREATININE 1.34* 1.46*  CALCIUM 10.0  --    GFR: Estimated Creatinine Clearance: 58.4 mL/min (A) (by C-G formula based on SCr of 1.46 mg/dL (H)). Liver Function Tests: No results for input(s): AST, ALT, ALKPHOS, BILITOT, PROT, ALBUMIN in the last 168 hours. No results for input(s): LIPASE, AMYLASE in the last 168 hours. No results for input(s): AMMONIA in the last 168 hours. Coagulation Profile: No results for input(s): INR, PROTIME in the last 168 hours. Cardiac Enzymes: Radiology Studies: Reviewed images personally in health database  Scheduled Meds: . alteplase  2 mg Intracatheter Once  . amLODipine  10 mg Per Tube Daily  . chlorhexidine gluconate (MEDLINE KIT)  15 mL Mouth  Rinse BID  . enoxaparin (LOVENOX) injection  40 mg Subcutaneous Daily  . folic acid  1 mg Per Tube Daily  . free water  250 mL Per Tube Q4H  . insulin aspart  0-15 Units Subcutaneous Q4H  . labetalol  300 mg Per Tube TID  . levETIRAcetam  500 mg Per Tube BID  . lipase/protease/amylase)  20,880 Units Per Tube Once   And  . sodium bicarbonate  650 mg Per Tube Once  . mouth rinse  15 mL Mouth Rinse 10 times per day  . multivitamin  15 mL Per Tube Daily  . pantoprazole sodium  40 mg Per Tube BID  . QUEtiapine  50 mg Per Tube QHS  . sodium chloride flush   10-40 mL Intracatheter Q12H  . thiamine  100 mg Per Tube Daily   Continuous Infusions: . feeding supplement (OSMOLITE 1.5 CAL) 1,000 mL (11/03/19 1431)    LOS: 55 days   Time spent: [place], MD [facility]  11/03/2019, 2:57 PM

## 2019-11-04 LAB — GLUCOSE, CAPILLARY
Glucose-Capillary: 91 mg/dL (ref 70–99)
Glucose-Capillary: 115 mg/dL — ABNORMAL HIGH (ref 70–99)
Glucose-Capillary: 116 mg/dL — ABNORMAL HIGH (ref 70–99)
Glucose-Capillary: 94 mg/dL (ref 70–99)
Glucose-Capillary: 110 mg/dL — ABNORMAL HIGH (ref 70–99)
Glucose-Capillary: 105 mg/dL — ABNORMAL HIGH (ref 70–99)
Glucose-Capillary: 133 mg/dL — ABNORMAL HIGH (ref 70–99)

## 2019-11-04 LAB — RENAL FUNCTION PANEL
Albumin: 3.1 g/dL — ABNORMAL LOW (ref 3.5–5.0)
Anion gap: 10 (ref 5–15)
BUN: 30 mg/dL — ABNORMAL HIGH (ref 6–20)
CO2: 30 mmol/L (ref 22–32)
Calcium: 10.1 mg/dL (ref 8.9–10.3)
Chloride: 99 mmol/L (ref 98–111)
Creatinine, Ser: 1.52 mg/dL — ABNORMAL HIGH (ref 0.61–1.24)
GFR calc Af Amer: >60 mL/min (ref >60)
GFR calc non Af Amer: 57 mL/min — ABNORMAL LOW (ref >60)
Glucose, Bld: 98 mg/dL (ref 70–99)
Phosphorus: 5.4 mg/dL — ABNORMAL HIGH (ref 2.5–4.6)
Potassium: 4.2 mmol/L (ref 3.5–5.1)
Sodium: 139 mmol/L (ref 135–145)

## 2019-11-04 NOTE — Progress Notes (Signed)
Hospitalist progress note  If 7PM-7AM,  night-coverage-look on AMION -prefer pages-not epic chat,please  Joshua [name]  PJK:[id number] DOB: 28-Jun-1981 DOA: 09/09/2019 PCP: Patient, [name]  Narrative:  38 year old male [place] 09/09/[place] having had a seizure incontinent bowel and bladder Came to the emergency room and was seen and found to have a large left hemispheric intracranial hemorrhage was rapidly declining neurological examination-left pupil and was not conscious nor following commands he went to surgery for left craniotomy decompressive craniectomy and evacuation of ICH  had a PEG tube placed 9/28 secondary to dysphagia he does not follow commands and has been n.p.o.  Assessment & Plan: Large left intraparenchymal hemorrhage status post decompressive craniectomy Dr. Vertell Limber 08/2011  Seizures secondary to above Continuing supportive management-also continue Keppra 500 twice daily aphasic and not really interactive--called GF yesterday-no response will try agin later Aspiration pneumonia Has PEG tube continue feeding supplements 70 cc/h multivitamin liquid as well as free water 250 every 4 Was treated for 7 days with antibiotics through 10/2 Stable at this time CKD 4 Rpt labs periodically HTN Poorly controlled on amlodipine 10 mg--change labetalol to 100 bid Probable alcohol withdrawal No signs or symptoms of withdrawal at this time Severe protein calorie malnutrition  DVT prophylaxis: Lovenox  Code Status:   Full   Family Communication:   None-see above Disposition Plan: Inpatient  Consultants:   Neurology Procedures:   None Antimicrobials:   No Subjective:  Awake About the same no new changes  Objective: Vitals:   11/03/19 2327 11/04/19 0400 11/04/19 0822 11/04/19 1210  BP: (!) 123/91 (!) 130/99 (!) 127/113 (!) 135/103  Pulse: 72 85 92 84  Resp: '16 16 18 18  ' Temp: 98.3 F (36.8 C) 98.6 F (37 C) 98.6 F (37 C) 98.6 F (37 C)  TempSrc:  Oral Oral Oral Oral  SpO2: 100% 100% 100% 100%  Weight:      Height:        Intake/Output Summary (Last 24 hours) at 11/04/2019 1435 Last data filed at 11/04/2019 0415 Gross per 24 hour  Intake -  Output 650 ml  Net -650 ml   Filed Weights   10/28/19 0411 10/30/19 0114 11/03/19 0419  Weight: 61.4 kg 60.1 kg 60.8 kg    Examination:  eomi ncat s1 s2 no m/r/g abd soft, Peg in place dense paresis on R side, L hand is in mittens PEG in place  Data Reviewed: I have personally reviewed following labs and imaging studies CBC: Recent Labs  Lab 11/01/19 0312  WBC 5.9  HGB 10.6*  HCT 33.1*  MCV 87.1  PLT 809   Basic Metabolic Panel: Recent Labs  Lab 11/01/19 0312 11/02/19 0458 11/04/19 0258  NA 137  --  139  K 4.2  --  4.2  CL 100  --  99  CO2 25  --  30  GLUCOSE 123*  --  98  BUN 30*  --  30*  CREATININE 1.34* 1.46* 1.52*  CALCIUM 10.0  --  10.1  PHOS  --   --  5.4*   GFR: Estimated Creatinine Clearance: 56.1 mL/min (A) (by C-G formula based on SCr of 1.52 mg/dL (H)). Liver Function Tests: Recent Labs  Lab 11/04/19 0258  ALBUMIN 3.1*   No results for input(s): LIPASE, AMYLASE in the last 168 hours. No results for input(s): AMMONIA in the last 168 hours. Coagulation Profile: No results for input(s): INR, PROTIME in the last 168 hours. Cardiac Enzymes: Radiology Studies:  Reviewed images personally in health database  Scheduled Meds: . alteplase  2 mg Intracatheter Once  . amLODipine  10 mg Per Tube Daily  . chlorhexidine gluconate (MEDLINE KIT)  15 mL Mouth Rinse BID  . enoxaparin (LOVENOX) injection  40 mg Subcutaneous Daily  . folic acid  1 mg Per Tube Daily  . free water  250 mL Per Tube Q4H  . insulin aspart  0-15 Units Subcutaneous Q4H  . levETIRAcetam  500 mg Per Tube BID  . mouth rinse  15 mL Mouth Rinse 10 times per day  . metoprolol tartrate  100 mg Per Tube BID  . multivitamin  15 mL Per Tube Daily  . pantoprazole sodium  40 mg Per Tube BID   . QUEtiapine  50 mg Per Tube QHS  . sodium chloride flush  10-40 mL Intracatheter Q12H  . thiamine  100 mg Per Tube Daily   Continuous Infusions: . feeding supplement (OSMOLITE 1.5 CAL) 1,000 mL (11/04/19 0010)    LOS: 56 days   Time spent: [place], MD [facility]  11/04/2019, 2:35 PM

## 2019-11-05 LAB — GLUCOSE, CAPILLARY
Glucose-Capillary: 135 mg/dL — ABNORMAL HIGH (ref 70–99)
Glucose-Capillary: 115 mg/dL — ABNORMAL HIGH (ref 70–99)
Glucose-Capillary: 86 mg/dL (ref 70–99)
Glucose-Capillary: 103 mg/dL — ABNORMAL HIGH (ref 70–99)
Glucose-Capillary: 105 mg/dL — ABNORMAL HIGH (ref 70–99)
Glucose-Capillary: 126 mg/dL — ABNORMAL HIGH (ref 70–99)

## 2019-11-05 NOTE — Progress Notes (Signed)
Hospitalist progress note  If 7PM-7AM,  night-coverage-look on AMION -prefer pages-not epic chat,please  Joshua [name]  TDS:[id number] DOB: 05-Mar-1981 DOA: 09/09/2019 PCP: Patient, [name]  Narrative:  38 year old male [place]09/[place] having had a seizure incontinent bowel and bladder Came to the emergency room and was seen and found to have a large left hemispheric intracranial hemorrhage was rapidly declining neurological examination-left pupil and was not conscious nor following commands he went to surgery for left craniotomy decompressive craniectomy and evacuation of ICH  had a PEG tube placed 9/28 secondary to dysphagia he does not follow commands and has been n.p.o.  Assessment & Plan: Large left intraparenchymal hemorrhage status post decompressive craniectomy Dr. Vertell Limber 08/2011  Seizures secondary to above Continuing supportive management-also continue Keppra 500 twice daily Aphasic but some motor tone today and has splint on Aspiration pneumonia Has PEG tube continue feeding supplements 70 cc/h multivitamin liquid as well as free water 250 every 4 Was treated for 7 days with antibiotics through 10/2 Stable at this time CKD 4 Rpt labs am HTN Poorly controlled on amlodipine 10 mg--change labetalol to 100 bid metoprolol and BP slightly improved Probable alcohol withdrawal No signs or symptoms of withdrawal at this time Severe protein calorie malnutrition  DVT prophylaxis: Lovenox  Code Status:   Full   Family Communication:   None-see above Disposition Plan: Inpatient  Consultants:   Neurology Procedures:   None Antimicrobials:   No Subjective:  Awake, RLE half out of the bed Has splint on Seems to fiddle with the same No reports of other issues  Objective: Vitals:   11/05/19 0552 11/05/19 0740 11/05/19 0906 11/05/19 1152  BP:  (!) 142/107 (!) 142/110 (!) 125/101  Pulse:  84 76 82  Resp:  '20 20 12  ' Temp:  98 F (36.7 C) 97.6 F (36.4  C) 99 F (37.2 C)  TempSrc:  Axillary Axillary Oral  SpO2:  100% 98% 96%  Weight: 61.7 kg     Height:        Intake/Output Summary (Last 24 hours) at 11/05/2019 1515 Last data filed at 11/05/2019 0555 Gross per 24 hour  Intake -  Output 1700 ml  Net -1700 ml   Filed Weights   10/30/19 0114 11/03/19 0419 11/05/19 0552  Weight: 60.1 kg 60.8 kg 61.7 kg    Examination:  eomi ncat post-op changes to L hemi-cranium s1 s2 no m/r/g abd soft, Peg in place dense paresis on R side which now has a splint on it, L hand is in mittens PEG in place   Data Reviewed: I have personally reviewed following labs and imaging studies CBC: Recent Labs  Lab 11/01/19 0312  WBC 5.9  HGB 10.6*  HCT 33.1*  MCV 87.1  PLT 262   Basic Metabolic Panel: Recent Labs  Lab 11/01/19 0312 11/02/19 0458 11/04/19 0258  NA 137  --  139  K 4.2  --  4.2  CL 100  --  99  CO2 25  --  30  GLUCOSE 123*  --  98  BUN 30*  --  30*  CREATININE 1.34* 1.46* 1.52*  CALCIUM 10.0  --  10.1  PHOS  --   --  5.4*   GFR: Estimated Creatinine Clearance: 56.9 mL/min (A) (by C-G formula based on SCr of 1.52 mg/dL (H)). Liver Function Tests: Recent Labs  Lab 11/04/19 0258  ALBUMIN 3.1*   No results for input(s): LIPASE, AMYLASE in the last 168 hours.  No results for input(s): AMMONIA in the last 168 hours. Coagulation Profile: No results for input(s): INR, PROTIME in the last 168 hours. Cardiac Enzymes: Radiology Studies: Reviewed images personally in health database  Scheduled Meds: . alteplase  2 mg Intracatheter Once  . amLODipine  10 mg Per Tube Daily  . chlorhexidine gluconate (MEDLINE KIT)  15 mL Mouth Rinse BID  . enoxaparin (LOVENOX) injection  40 mg Subcutaneous Daily  . folic acid  1 mg Per Tube Daily  . free water  250 mL Per Tube Q4H  . insulin aspart  0-15 Units Subcutaneous Q4H  . levETIRAcetam  500 mg Per Tube BID  . mouth rinse  15 mL Mouth Rinse 10 times per day  . metoprolol tartrate   100 mg Per Tube BID  . multivitamin  15 mL Per Tube Daily  . pantoprazole sodium  40 mg Per Tube BID  . QUEtiapine  50 mg Per Tube QHS  . sodium chloride flush  10-40 mL Intracatheter Q12H  . thiamine  100 mg Per Tube Daily   Continuous Infusions: . feeding supplement (OSMOLITE 1.5 CAL) 1,000 mL (11/04/19 0010)    LOS: 57 days   Time spent: [place], MD [facility]  11/05/2019, 3:15 PM

## 2019-11-05 NOTE — Plan of Care (Signed)
Patient stable, discussed POC with patient and family at bedside, agreeable with plan, denies question/concerns at this time.

## 2019-11-05 NOTE — TOC Progression Note (Signed)
Transition of Care [facility]) - Progression Note    Patient Details  Name: Joshua [name] MRN: [id number] Date of Birth: 29-Jun-1981  Transition of Care [facility]) CM/SW [place], [place] Phone Number: 11/05/2019, 11:13 AM  Clinical Narrative:     No current bed offers. CSW will continue to follow and assist with disposition planning.    Expected Discharge Plan: [place] Barriers to Discharge: Continued Medical Work up, SNF Pending payor source - LOG  Expected Discharge Plan and Services Expected Discharge Plan: [place] In-house Referral: Clinical Social Work Discharge Planning Services: NA Post Acute Care Choice: [place] Living arrangements for the past 2 months: Apartment                 DME Arranged: N/A DME Agency: NA       HH Arranged: NA HH Agency: NA         Social Determinants of Health (SDOH) Interventions    Readmission Risk Interventions No flowsheet data found.

## 2019-11-06 LAB — GLUCOSE, CAPILLARY
Glucose-Capillary: 101 mg/dL — ABNORMAL HIGH (ref 70–99)
Glucose-Capillary: 116 mg/dL — ABNORMAL HIGH (ref 70–99)
Glucose-Capillary: 121 mg/dL — ABNORMAL HIGH (ref 70–99)
Glucose-Capillary: 128 mg/dL — ABNORMAL HIGH (ref 70–99)
Glucose-Capillary: 132 mg/dL — ABNORMAL HIGH (ref 70–99)
Glucose-Capillary: 86 mg/dL (ref 70–99)

## 2019-11-06 NOTE — Progress Notes (Signed)
Nutrition Follow-up  DOCUMENTATION CODES:   Not applicable  INTERVENTION:  - continue Osmolite 1.5 @ 70 ml/hr with 250 ml free water every 4 hours via PEG.   NUTRITION DIAGNOSIS:   Inadequate oral intake related to inability to eat as evidenced by NPO status. -ongoing  GOAL:   Patient will meet greater than or equal to 90% of their needs -met with TF regimen  MONITOR:   TF tolerance, Weight trends  ASSESSMENT:   37 yo male admitted with large left intracranial hemorrhage; S/P craniotomy. PMH includes HTN, alcoholism, noncompliance.  Significant Events: 9/12- L frontal temporal crani and evacuation of hematoma 9/23- extubation 10/1- PEG placement  Weight has been stable the past ~3 weeks. Patient is noted to be non-verbal. He has PEG in place and is receiving Osmolite 1.5 @ 70 ml/hr with 250 ml free water every 4 hours. This regimen is providing 2520 kcal, 105 grams protein, and 2780 ml free water.   Per notes: - SLP signed off on 11/4 and at that time recommended that patient continue to be NPO - no discharge plan determined    Labs reviewed; CBG: 101 mg/dl. Medications reviewed; 1 mg folvite/day, sliding scale novolog, 15 ml liquid multivitamin per PEG/day, 100 mg thiamine per PEG/day.     Diet Order:   Diet Order            Diet NPO time specified  Diet effective midnight              EDUCATION NEEDS:   Not appropriate for education at this time  Skin:  Skin Assessment: Reviewed RN Assessment  Last BM:  11/7  Height:   Ht Readings from Last 1 Encounters:  09/09/19 '5\' 9"'  (1.753 m)    Weight:   Wt Readings from Last 1 Encounters:  11/06/19 61.6 kg    Ideal Body Weight:  72.7 kg  BMI:  Body mass index is 20.05 kg/m.  Estimated Nutritional Needs:   Kcal:  2100-2400  Protein:  105-125 grams  Fluid:  >/= 2 L/day     Jarome Matin, MS, RD, LDN, [facility] Inpatient Clinical Dietitian Pager # [phone] After hours/weekend pager #  [phone]

## 2019-11-06 NOTE — Progress Notes (Signed)
Hospitalist progress note  If 7PM-7AM,  night-coverage-look on AMION -prefer pages-not epic chat,please  Joshua [name]  PPJ:[id number] DOB: 06/29/1981 DOA: 09/09/2019 PCP: Patient, [name]  Narrative:  38 year old male [place] having had a seizure incontinent bowel and bladder Came to the emergency room and was seen and found to have a large left hemispheric intracranial hemorrhage was rapidly declining neurological examination-left pupil and was not conscious nor following commands he went to surgery for left craniotomy decompressive craniectomy and evacuation of ICH  had a PEG tube placed 9/28 secondary to dysphagia he does not follow commands and has been n.p.o.  Assessment & Plan: Large left intraparenchymal hemorrhage status post decompressive craniectomy Dr. Vertell Limber 08/2011  Seizures secondary to above Continuing supportive management-also continue Keppra 500 twice daily Aphasic and nonverbal Cousin was at bedsdie today I discussed long road ahead of patient in terms of recovery Aspiration pneumonia Has PEG tube continue feeding supplements 70 cc/h multivitamin liquid as well as free water 250 every 4 Was treated for 7 days with antibiotics through 10/2 Stable at this time CKD 4 Rpt labs in a.m. HTN Poorly controlled on amlodipine 10 mg--change labetalol to 100 bid metoprolol and BP slightly improved Probable alcohol withdrawal No signs or symptoms of withdrawal at this time Severe protein calorie malnutrition  DVT prophylaxis: Lovenox  Code Status:   Full   Family Communication:   Discussed with husband at the bedside Messages on 11/9 Disposition Plan: Inpatient  Consultants:   Neurology Procedures:   None Antimicrobials:   No Subjective:  Awake slightly fidgety attempt to pass a phasic  Objective: Vitals:   11/06/19 0325 11/06/19 0411 11/06/19 0853 11/06/19 1231  BP: 118/89  135/88 113/88  Pulse: 70  82 83  Resp: _0 Temp:  98.2 F (36.8 C)  98.9 F (37.2 C) 99.5 F (37.5 C)  TempSrc:   Oral Oral  SpO2: 100%  100% 100%  Weight:  61.6 kg    Height:        Intake/Output Summary (Last 24 hours) at 11/06/2019 1343 Last data filed at 11/06/2019 1033 Gross per 24 hour  Intake 0 ml  Output 1000 ml  Net -1000 ml   Filed Weights   11/03/19 0419 11/05/19 0552 11/06/19 0411  Weight: 60.8 kg 61.7 kg 61.6 kg    Examination:  eomi ncat post-op changes to L hemi-cranium s1 s2 no m/r/g abd soft, Peg in place dense paresis on R side which now has a splint on it, L hand is in mittens PEG in place   Data Reviewed: I have personally reviewed following labs and imaging studies CBC: Recent Labs  Lab 11/01/19 0312  WBC 5.9  HGB 10.6*  HCT 33.1*  MCV 87.1  PLT 580   Basic Metabolic Panel: Recent Labs  Lab 11/01/19 0312 11/02/19 0458 11/04/19 0258  NA 137  --  139  K 4.2  --  4.2  CL 100  --  99  CO2 25  --  30  GLUCOSE 123*  --  98  BUN 30*  --  30*  CREATININE 1.34* 1.46* 1.52*  CALCIUM 10.0  --  10.1  PHOS  --   --  5.4*   GFR: Estimated Creatinine Clearance: 56.8 mL/min (A) (by C-G formula based on SCr of 1.52 mg/dL (H)). Liver Function Tests: Recent Labs  Lab 11/04/19 0258  ALBUMIN 3.1*   No results for input(s): LIPASE, AMYLASE in the last  168 hours. No results for input(s): AMMONIA in the last 168 hours. Coagulation Profile: No results for input(s): INR, PROTIME in the last 168 hours. Cardiac Enzymes: Radiology Studies: Reviewed images personally in health database  Scheduled Meds: . alteplase  2 mg Intracatheter Once  . amLODipine  10 mg Per Tube Daily  . chlorhexidine gluconate (MEDLINE KIT)  15 mL Mouth Rinse BID  . enoxaparin (LOVENOX) injection  40 mg Subcutaneous Daily  . folic acid  1 mg Per Tube Daily  . free water  250 mL Per Tube Q4H  . insulin aspart  0-15 Units Subcutaneous Q4H  . levETIRAcetam  500 mg Per Tube BID  . mouth rinse  15 mL Mouth Rinse 10 times per  day  . metoprolol tartrate  100 mg Per Tube BID  . multivitamin  15 mL Per Tube Daily  . pantoprazole sodium  40 mg Per Tube BID  . QUEtiapine  50 mg Per Tube QHS  . sodium chloride flush  10-40 mL Intracatheter Q12H  . thiamine  100 mg Per Tube Daily   Continuous Infusions: . feeding supplement (OSMOLITE 1.5 CAL) 1,000 mL (11/04/19 0010)    LOS: 58 days   Time spent: [place], MD [facility]  11/06/2019, 1:43 PM

## 2019-11-07 LAB — RENAL FUNCTION PANEL
Albumin: 3.3 g/dL — ABNORMAL LOW (ref 3.5–5.0)
Anion gap: 10 (ref 5–15)
BUN: 35 mg/dL — ABNORMAL HIGH (ref 6–20)
CO2: 28 mmol/L (ref 22–32)
Calcium: 10.2 mg/dL (ref 8.9–10.3)
Chloride: 98 mmol/L (ref 98–111)
Creatinine, Ser: 1.46 mg/dL — ABNORMAL HIGH (ref 0.61–1.24)
GFR calc Af Amer: >60 mL/min (ref >60)
GFR calc non Af Amer: 60 mL/min — ABNORMAL LOW (ref >60)
Glucose, Bld: 78 mg/dL (ref 70–99)
Phosphorus: 5 mg/dL — ABNORMAL HIGH (ref 2.5–4.6)
Potassium: 4.5 mmol/L (ref 3.5–5.1)
Sodium: 136 mmol/L (ref 135–145)

## 2019-11-07 LAB — GLUCOSE, CAPILLARY
Glucose-Capillary: 72 mg/dL (ref 70–99)
Glucose-Capillary: 100 mg/dL — ABNORMAL HIGH (ref 70–99)
Glucose-Capillary: 70 mg/dL (ref 70–99)
Glucose-Capillary: 89 mg/dL (ref 70–99)
Glucose-Capillary: 91 mg/dL (ref 70–99)
Glucose-Capillary: 80 mg/dL (ref 70–99)

## 2019-11-07 NOTE — Progress Notes (Signed)
Occupational Therapy Treatment Patient Details Name: Joshua [name] MRN: [id number] DOB: April 11, 1981 Today's Date: 11/07/2019    History of present illness Pt is a 38 y.o. 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 S/p L frontoparietal craniectomy for evacuation of L basal ganglia hematoma 9/12. ETT 9/12-9/18, 9/18-9/23. PEG tube placed 9/28 secondary to dysphagia. PMH includes alcohol abuse, seizures, HTN.   OT comments  Pt seen in conjunction with PT.  He required total A +2 for bed mobility and mod A - max A +2 for for sitting EOB. He pushes heavily to the Lt and posteriorly.  He stood with max A +2 briefly.  He followed one one step command, and spontaneously picked up phone and teddy bear.  Otherwise, he did not interact with his environment otherwise.  He dis attempt to touch crani site multiple times - would benefit from a helmet.   Follow Up Recommendations  SNF;Supervision/Assistance - 24 hour    Equipment Recommendations  None recommended by OT    Recommendations for Other Services      Precautions / Restrictions Precautions Precautions: Fall Precaution Comments: L crani, PEG, L hand soft mitt Required Braces or Orthoses: Splint/Cast Splint/Cast: resting hand splint and elbow splint for L UE due to contracture       Mobility Bed Mobility Overal bed mobility: Needs Assistance Bed Mobility: Sit to Supine;Supine to Sit     Supine to sit: Total assist;+2 for physical assistance;+2 for safety/equipment Sit to supine: Total assist;+2 for physical assistance;+2 for safety/equipment   General bed mobility comments: requires assist for all aspects   Transfers Overall transfer level: Needs assistance Equipment used: 2 person hand held assist   Sit to Stand: Max assist;+2 physical assistance;+2 safety/equipment         General transfer comment: Pt stood x 1.5 mins with facilitation to bil hips and trunk from extension      Balance Overall balance assessment: Needs assistance Sitting-balance support: Feet supported;Single extremity supported Sitting balance-Leahy Scale: Poor Sitting balance - Comments: Pt required mod- max A+2 to maintain EOB sitting.  Pt demonstrates pusher syndrome and pushes posteriorly and to the Rt  Postural control: Right lateral lean;Posterior lean   Standing balance-Leahy Scale: Poor Standing balance comment: Pt requires max A +2 to maintain static standing                            ADL either performed or assessed with clinical judgement   ADL Overall ADL's : Needs assistance/impaired Eating/Feeding: NPO   Grooming: Wash/dry hands;Wash/dry face;Total assistance;Sitting                   Toilet Transfer: Maximal assistance;+2 for physical assistance;+2 for safety/equipment;BSC           Functional mobility during ADLs: Total assistance;+2 for physical assistance;+2 for safety/equipment       Vision       Perception     Praxis      Cognition Arousal/Alertness: Awake/alert Behavior During Therapy: Flat affect;Restless Overall Cognitive Status: Impaired/Different from baseline Area of Impairment: Attention;Following commands                   Current Attention Level: Focused   Following Commands: Follows one step commands inconsistently     Problem Solving: Slow processing;Decreased initiation;Difficulty sequencing;Requires verbal cues;Requires tactile cues General Comments: Pt awake, he did not visually track, but did fixate  on phone x 2.  He did grab an orange teddy bear x 1, but followed no other commands, and did not engage with his environment except to grab the teddy bear and cell phone         Exercises General Exercises - Upper Extremity Elbow Extension: PROM;Both;10 reps;Right Digit Composite Flexion: PROM;Right;Supine;10 reps Composite Extension: PROM;Right;Supine;10 reps   Shoulder Instructions       General  Comments Girlfriend present at end of session     Pertinent Vitals/ Pain       Pain Assessment: Faces Faces Pain Scale: Hurts little more Pain Location: Rt elbow with passive extension  Pain Descriptors / Indicators: Guarding;Grimacing Pain Intervention(s): Monitored during session;Limited activity within patient's tolerance;Repositioned  Home Living                                          Prior Functioning/Environment              Frequency  Min 2X/week        Progress Toward Goals  OT Goals(current goals can now be found in the care plan section)  Progress towards OT goals: Progressing toward goals(minimally )     Plan Discharge plan remains appropriate    Co-evaluation    PT/OT/SLP Co-Evaluation/Treatment: Yes Reason for Co-Treatment: Complexity of the patient's impairments (multi-system involvement);Necessary to address cognition/behavior during functional activity;For patient/therapist safety;To address functional/ADL transfers   OT goals addressed during session: ADL's and self-care;Strengthening/ROM      AM-PAC OT "6 Clicks" Daily Activity     Outcome Measure   Help from another person eating meals?: Total Help from another person taking care of personal grooming?: Total Help from another person toileting, which includes using toliet, bedpan, or urinal?: Total Help from another person bathing (including washing, rinsing, drying)?: Total Help from another person to put on and taking off regular upper body clothing?: Total Help from another person to put on and taking off regular lower body clothing?: Total 6 Click Score: 6    End of Session    OT Visit Diagnosis: Other abnormalities of gait and mobility (R26.89);Hemiplegia and hemiparesis;Cognitive communication deficit (R41.841) Symptoms and signs involving cognitive functions: Other Nontraumatic ICH Hemiplegia - Right/Left: Right Hemiplegia - caused by: Other Nontraumatic  intracranial hemorrhage   Activity Tolerance Patient tolerated treatment well   Patient Left in bed;with call bell/phone within reach;with bed alarm set;with family/visitor present   Nurse Communication Mobility status        Time: [id number] OT Time Calculation (min): 32 min  Charges: OT General Charges $OT Visit: 1 Visit OT Treatments $Neuromuscular Re-education: 8-22 mins  Lucille Passy, OTR/L [place] Pager [phone] Office [phone]    Lucille Passy M 11/07/2019, 2:20 PM

## 2019-11-07 NOTE — Progress Notes (Signed)
Physical Therapy Treatment Patient Details Name: Joshua [name] MRN: [id number] DOB: [birth year] Today's Date: 11/07/2019    History of Present Illness Pt is a 38 y.o. 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 S/p L frontoparietal craniectomy for evacuation of L basal ganglia hematoma 9/12. ETT 9/12-9/18, 9/18-9/23. PEG tube placed 9/28 secondary to dysphagia. PMH includes alcohol abuse, seizures, HTN.    PT Comments    Pt restless upon PT/OT arrival to room. Focus of session was on sitting balance, trunk and cervical posture, and tolerance for activity. Pt with significant pusher's presentation, requiring mod-max assist to break LUE pushing toward R. Pt also required mod-max assist for trunk assist, as pt with posterior and R lateral leaning preference in sitting, due to combination of factors including pusher's, extensor tone. Pt briefly stood with max +2, pt with zero standing balance today. PT to continue to follow acutely.    Follow Up Recommendations  SNF     Equipment Recommendations  Wheelchair (measurements PT);Wheelchair cushion (measurements PT);Hospital bed;3in1 (PT)    Recommendations for Other Services       Precautions / Restrictions Precautions Precautions: Fall Precaution Comments: L crani, PEG, L hand soft mitt Required Braces or Orthoses: Splint/Cast Splint/Cast: resting hand splint and elbow splint for L UE due to contracture Restrictions Weight Bearing Restrictions: No    Mobility  Bed Mobility Overal bed mobility: Needs Assistance Bed Mobility: Sit to Supine;Supine to Sit     Supine to sit: Total assist;+2 for physical assistance;+2 for safety/equipment Sit to supine: Total assist;+2 for physical assistance;+2 for safety/equipment   General bed mobility comments: assist for trunk elevation/lowering, LE management, scooting to and from EOB, positioning in bed, and come to sitting (heavy truncal extensor tone  and pushing).  Transfers Overall transfer level: Needs assistance Equipment used: 2 person hand held assist   Sit to Stand: Max assist;+2 physical assistance;+2 safety/equipment         General transfer comment: Pt stood x 1.5 mins with facilitation to bil hips and trunk from extension, attempted to lift pt head up but pt holding head in significant forward flexion even with tactile faciltation.  Ambulation/Gait Ambulation/Gait assistance: (NT)               Stairs             Wheelchair Mobility    Modified Rankin (Stroke Patients Only)       Balance Overall balance assessment: Needs assistance Sitting-balance support: Feet supported;Single extremity supported Sitting balance-Leahy Scale: Poor Sitting balance - Comments: Pt required mod- max A+2 to maintain EOB sitting. Pt demonstrates pusher syndrome and pushes posteriorly and to the Rt, requiring posterior support from PT. Pt sat EOB ~20 minutes for neck stretching and alignment, sitting balance. Requires frequent PT/OT assist for hand placement to avoid pusher's position. Postural control: Right lateral lean;Posterior lean   Standing balance-Leahy Scale: Poor Standing balance comment: Pt requires max A +2 to maintain static standing                             Cognition Arousal/Alertness: Awake/alert Behavior During Therapy: Flat affect;Restless Overall Cognitive Status: Impaired/Different from baseline Area of Impairment: Attention;Following commands;Safety/judgement;Awareness                   Current Attention Level: Focused   Following Commands: Follows one step commands inconsistently Safety/Judgement: Decreased awareness of safety Awareness: Intellectual  Problem Solving: Slow processing;Decreased initiation;Difficulty sequencing;Requires verbal cues;Requires tactile cues General Comments: Pt does not visually track PT/OT this session, responds to painful stimuli like PROM RUE  with reaching for RUE but no physical expression in face of distress. Pt follows commands very minimally, if at all, able to hold and handle teddy bear and phone today.      Exercises General Exercises - Upper Extremity Elbow Extension: PROM;Both;10 reps;Right Digit Composite Flexion: PROM;Right;Supine;10 reps Composite Extension: PROM;Right;Supine;10 reps Other Exercises Other Exercises: L cervical rotation and upright positioning of head, total ~2 minutes    General Comments General comments (skin integrity, edema, etc.): Girlfriend present at end of session      Pertinent Vitals/Pain Pain Assessment: Faces Faces Pain Scale: Hurts little more Pain Location: Rt elbow with passive extension  Pain Descriptors / Indicators: Guarding;Grimacing Pain Intervention(s): Limited activity within patient's tolerance;Monitored during session;Repositioned    Home Living                      Prior Function            PT Goals (current goals can now be found in the care plan section) Acute Rehab PT Goals Patient Stated Goal: none stated PT Goal Formulation: Patient unable to participate in goal setting Time For Goal Achievement: 11/15/19 Potential to Achieve Goals: Fair Progress towards PT goals: Progressing toward goals    Frequency    Min 2X/week      PT Plan Current plan remains appropriate    Co-evaluation PT/OT/SLP Co-Evaluation/Treatment: Yes Reason for Co-Treatment: Complexity of the patient's impairments (multi-system involvement);Necessary to address cognition/behavior during functional activity;For patient/therapist safety;To address functional/ADL transfers PT goals addressed during session: Mobility/safety with mobility;Balance OT goals addressed during session: ADL's and self-care;Strengthening/ROM      AM-PAC PT "6 Clicks" Mobility   Outcome Measure  Help needed turning from your back to your side while in a flat bed without using bedrails?:  Total Help needed moving from lying on your back to sitting on the side of a flat bed without using bedrails?: Total Help needed moving to and from a bed to a chair (including a wheelchair)?: Total Help needed standing up from a chair using your arms (e.g., wheelchair or bedside chair)?: Total Help needed to walk in hospital room?: Total Help needed climbing 3-5 steps with a railing? : Total 6 Click Score: 6    End of Session   Activity Tolerance: Patient limited by fatigue Patient left: with call bell/phone within reach;with bed alarm set;in bed;Other (comment);with family/visitor present(girlfriend present, L mitt applied) Nurse Communication: Mobility status PT Visit Diagnosis: Muscle weakness (generalized) (M62.81);Difficulty in walking, not elsewhere classified (R26.2);Hemiplegia and hemiparesis Hemiplegia - Right/Left: Right Hemiplegia - dominant/non-dominant: Dominant Hemiplegia - caused by: Nontraumatic intracerebral hemorrhage     Time: [id number] PT Time Calculation (min) (ACUTE ONLY): 35 min  Charges:  $Neuromuscular Re-education: 8-22 mins                     Maitlyn Penza E, PT Acute Rehabilitation Services Pager [phone]  Office [phone]   Katerin Negrete D Elonda Husky 11/07/2019, 3:48 PM

## 2019-11-07 NOTE — Progress Notes (Signed)
Hospitalist progress note  If 7PM-7AM,  night-coverage-look on AMION -prefer pages-not epic chat,please  Leory Allinson  BSJ:[id number] DOB: Jan 04, 1981 DOA: 09/09/2019 PCP: Patient, [name]  Narrative:  38 year old male admitted 09/09/2019 [place] having had a seizure incontinent bowel and bladder Came to the emergency room and was seen and found to have a large left hemispheric intracranial hemorrhage was rapidly declining neurological examination-left pupil and was not conscious nor following commands he went to surgery for left craniotomy decompressive craniectomy and evacuation of ICH  had a PEG tube placed 9/28 secondary to dysphagia he does not follow commands and has been n.p.o.  Assessment & Plan: Large left intraparenchymal hemorrhage status post decompressive craniectomy Dr. Vertell Limber 08/2011  Seizures secondary to above Continuing supportive management-also continue Keppra 500 twice daily Aphasic and nonverbal GF at bedside-she hasnn't seen any significant changes  Other than him being able to track her around the room with his eyes Aspiration pneumonia Has PEG tube continue feeding supplements 70 cc/h multivitamin liquid as well as free water 250 every 4 Was treated for 7 days with antibiotics through 10/2 Stable at this time CKD 4 Labs steadily improving-monitor hydration HTN Poorly controlled on amlodipine 10 mg--change labetalol to 100 bid metoprolol and BP slightly improved Probable alcohol withdrawal No signs or symptoms of withdrawal at this time Severe protein calorie malnutrition  DVT prophylaxis: Lovenox  Code Status:   Full   Family Communication:   Discussed with husband at the bedside Messages on 11/9 Disposition Plan: Inpatient  Consultants:   Neurology Procedures:   None Antimicrobials:   No Subjective:  No new issues  Objective: Vitals:   11/06/19 2357 11/07/19 0336 11/07/19 0810 11/07/19 1140  BP: (!) 121/101 (!) 129/106 (!) 135/103  (!) 134/91  Pulse: 66 68 72 74  Resp: '18 18 16 16  ' Temp: 97.6 F (36.4 C) 97.8 F (36.6 C) 98.6 F (37 C)   TempSrc: Axillary Axillary Axillary   SpO2: 99% 98% 100% 98%  Weight:      Height:        Intake/Output Summary (Last 24 hours) at 11/07/2019 1449 Last data filed at 11/07/2019 0337 Gross per 24 hour  Intake -  Output 900 ml  Net -900 ml   Filed Weights   11/03/19 0419 11/05/19 0552 11/06/19 0411  Weight: 60.8 kg 61.7 kg 61.6 kg    Examination:  eomi ncat post-op changes to L hemi-cranium s1 s2 no m/r/g abd soft, Peg in place dense paresis on R side L hand is in mittens PEG in place   Data Reviewed: I have personally reviewed following labs and imaging studies CBC: Recent Labs  Lab 11/01/19 0312  WBC 5.9  HGB 10.6*  HCT 33.1*  MCV 87.1  PLT 765   Basic Metabolic Panel: Recent Labs  Lab 11/01/19 0312 11/02/19 0458 11/04/19 0258 11/07/19 0341  NA 137  --  139 136  K 4.2  --  4.2 4.5  CL 100  --  99 98  CO2 25  --  30 28  GLUCOSE 123*  --  98 78  BUN 30*  --  30* 35*  CREATININE 1.34* 1.46* 1.52* 1.46*  CALCIUM 10.0  --  10.1 10.2  PHOS  --   --  5.4* 5.0*   GFR: Estimated Creatinine Clearance: 59.2 mL/min (A) (by C-G formula based on SCr of 1.46 mg/dL (H)). Liver Function Tests: Recent Labs  Lab 11/04/19 0258 11/07/19 0341  ALBUMIN 3.1* 3.3*  No results for input(s): LIPASE, AMYLASE in the last 168 hours. No results for input(s): AMMONIA in the last 168 hours. Coagulation Profile: No results for input(s): INR, PROTIME in the last 168 hours. Cardiac Enzymes: Radiology Studies: Reviewed images personally in health database  Scheduled Meds: . alteplase  2 mg Intracatheter Once  . amLODipine  10 mg Per Tube Daily  . chlorhexidine gluconate (MEDLINE KIT)  15 mL Mouth Rinse BID  . enoxaparin (LOVENOX) injection  40 mg Subcutaneous Daily  . folic acid  1 mg Per Tube Daily  . free water  250 mL Per Tube Q4H  . insulin aspart  0-15  Units Subcutaneous Q4H  . levETIRAcetam  500 mg Per Tube BID  . mouth rinse  15 mL Mouth Rinse 10 times per day  . metoprolol tartrate  100 mg Per Tube BID  . multivitamin  15 mL Per Tube Daily  . pantoprazole sodium  40 mg Per Tube BID  . QUEtiapine  50 mg Per Tube QHS  . sodium chloride flush  10-40 mL Intracatheter Q12H  . thiamine  100 mg Per Tube Daily   Continuous Infusions: . feeding supplement (OSMOLITE 1.5 CAL) 1,000 mL (11/04/19 0010)    LOS: 59 days   Time spent: [place], MD [facility]  11/07/2019, 2:49 PM

## 2019-11-08 LAB — GLUCOSE, CAPILLARY
Glucose-Capillary: 104 mg/dL — ABNORMAL HIGH (ref 70–99)
Glucose-Capillary: 105 mg/dL — ABNORMAL HIGH (ref 70–99)
Glucose-Capillary: 107 mg/dL — ABNORMAL HIGH (ref 70–99)
Glucose-Capillary: 117 mg/dL — ABNORMAL HIGH (ref 70–99)
Glucose-Capillary: 125 mg/dL — ABNORMAL HIGH (ref 70–99)
Glucose-Capillary: 128 mg/dL — ABNORMAL HIGH (ref 70–99)
Glucose-Capillary: 143 mg/dL — ABNORMAL HIGH (ref 70–99)

## 2019-11-08 MED ORDER — CHLORHEXIDINE GLUCONATE 0.12 % MT SOLN
15.0000 mL | Freq: Two times a day (BID) | OROMUCOSAL | Status: DC
  Administered 2019-11-08 – 2020-05-01 (×193): 15 mL via OROMUCOSAL
  Filled 2019-11-08 (×271): qty 15

## 2019-11-08 MED ORDER — ORAL CARE MOUTH RINSE
15.0000 mL | Freq: Two times a day (BID) | OROMUCOSAL | Status: DC
  Administered 2019-11-09 – 2020-05-02 (×145): 15 mL via OROMUCOSAL

## 2019-11-08 MED ORDER — HYDRALAZINE HCL 25 MG PO TABS
25.0000 mg | ORAL_TABLET | Freq: Three times a day (TID) | ORAL | Status: DC
  Administered 2019-11-08 – 2019-11-09 (×4): 25 mg via ORAL
  Filled 2019-11-08 (×5): qty 1

## 2019-11-08 NOTE — Progress Notes (Signed)
[name]  PROGRESS NOTE    Joshua [name]  ENI:[id number] DOB: 1981/01/01 DOA: 09/09/2019 PCP: Patient, [name]   Brief Narrative:   38 year old male [place]09/[place] having had a seizure incontinent bowel and bladder Came to the emergency room and was seen and found to have a large left hemispheric intracranial hemorrhage was rapidly declining neurological examination-left pupil and was not conscious nor following commands he went to surgery for left craniotomy decompressive craniectomy and evacuation of ICH  had a PEG tube placed 9/28 secondary to dysphagia he does not follow commands and has been n.p.o.  11/11: HTN-ive ON. Otherwise, no acute issues.   Assessment & Plan:   Active Problems:   ICH (intracerebral hemorrhage) (HCC)   Subdural hematoma (HCC)   Intracerebral hemorrhage (HCC)   Cytotoxic brain edema (HCC)   Hemorrhagic stroke (HCC)   Acute respiratory failure ([place])   Essential hypertension   Severe protein-calorie malnutrition ([place])   CKD (chronic kidney disease), stage IV (HCC)   Seizure ([place])  Large left intraparenchymal hemorrhage      - status post decompressive craniectomy Dr. Vertell Limber 08/2011     - non-verbal     - supportive care     - PT/OT rec SNF  Seizures; secondary to above     - continue Keppra 500 BID; supportive care  Asp PNA     - s/p PEG tube;  continue feeding supplements/free H2O     - s/p 7-day abx course  CKD 4     - stable, monitor      - watch nephrotoxins  HTN     - continue amlodipine 10 mg, metoprolol 112m BID     - DBP is still up; add hydralazine 251mTID  Probable alcohol withdrawal?     - No signs or symptoms of withdrawal at this time; monitor  Severe protein calorie malnutrition     - TF per PEG as above  DVT prophylaxis: lovenox Code Status: FULL   Disposition Plan: TBD  Consultants:   Neurology  ROS:  Unable to obtain d/t physical/mental limitation.  Subjective: No acute events ON per  nursing  Objective: Vitals:   11/08/19 0400 11/08/19 0829 11/08/19 0841 11/08/19 1232  BP: (!) 150/97 (!) 157/115  (!) 145/108  Pulse:   83 85  Resp:   14 16  Temp:   98.5 F (36.9 C) 98.5 F (36.9 C)  TempSrc:   Oral Oral  SpO2:   100% 98%  Weight:      Height:        Intake/Output Summary (Last 24 hours) at 11/08/2019 1419 Last data filed at 11/08/2019 066144ross per 24 hour  Intake 2117.5 ml  Output 1460 ml  Net 657.5 ml   Filed Weights   11/03/19 0419 11/05/19 0552 11/06/19 0411  Weight: 60.8 kg 61.7 kg 61.6 kg    Examination:  General: [age].o. male resting in bed in NAD Cardiovascular: RRR, +S1, S2, no m/g/r, equal pulses throughout Respiratory: CTABL, no w/r/r, normal WOB GI: BS+, NDNT, no masses noted, no organomegaly noted MSK: No e/c/c Neuro: follows some commands, aphasic   Data Reviewed: I have personally reviewed following labs and imaging studies.  CBC: No results for input(s): WBC, NEUTROABS, HGB, HCT, MCV, PLT in the last 168 hours. Basic Metabolic Panel: Recent Labs  Lab 11/02/19 0458 11/04/19 0258 11/07/19 0341  NA  --  139 136  K  --  4.2 4.5  CL  --  99 98  CO2  --  30 28  GLUCOSE  --  98 78  BUN  --  30* 35*  CREATININE 1.46* 1.52* 1.46*  CALCIUM  --  10.1 10.2  PHOS  --  5.4* 5.0*   GFR: Estimated Creatinine Clearance: 59.2 mL/min (A) (by C-G formula based on SCr of 1.46 mg/dL (H)). Liver Function Tests: Recent Labs  Lab 11/04/19 0258 11/07/19 0341  ALBUMIN 3.1* 3.3*   No results for input(s): LIPASE, AMYLASE in the last 168 hours. No results for input(s): AMMONIA in the last 168 hours. Coagulation Profile: No results for input(s): INR, PROTIME in the last 168 hours. Cardiac Enzymes: No results for input(s): CKTOTAL, CKMB, CKMBINDEX, TROPONINI in the last 168 hours. BNP (last 3 results) No results for input(s): PROBNP in the last 8760 hours. HbA1C: No results for input(s): HGBA1C in the last 72 hours. CBG: Recent Labs   Lab 11/07/19 2028 11/08/19 0027 11/08/19 0348 11/08/19 0815 11/08/19 1234  GLUCAP 80 143* 128* 104* 107*   Lipid Profile: No results for input(s): CHOL, HDL, LDLCALC, TRIG, CHOLHDL, LDLDIRECT in the last 72 hours. Thyroid Function Tests: No results for input(s): TSH, T4TOTAL, FREET4, T3FREE, THYROIDAB in the last 72 hours. Anemia Panel: No results for input(s): VITAMINB12, FOLATE, FERRITIN, TIBC, IRON, RETICCTPCT in the last 72 hours. Sepsis Labs: No results for input(s): PROCALCITON, LATICACIDVEN in the last 168 hours.  No results found for this or any previous visit (from the past 240 hour(s)).    Radiology Studies: No results found.   Scheduled Meds: . alteplase  2 mg Intracatheter Once  . amLODipine  10 mg Per Tube Daily  . chlorhexidine gluconate (MEDLINE KIT)  15 mL Mouth Rinse BID  . enoxaparin (LOVENOX) injection  40 mg Subcutaneous Daily  . folic acid  1 mg Per Tube Daily  . free water  250 mL Per Tube Q4H  . hydrALAZINE  25 mg Oral Q8H  . insulin aspart  0-15 Units Subcutaneous Q4H  . levETIRAcetam  500 mg Per Tube BID  . mouth rinse  15 mL Mouth Rinse 10 times per day  . metoprolol tartrate  100 mg Per Tube BID  . multivitamin  15 mL Per Tube Daily  . pantoprazole sodium  40 mg Per Tube BID  . QUEtiapine  50 mg Per Tube QHS  . sodium chloride flush  10-40 mL Intracatheter Q12H  . thiamine  100 mg Per Tube Daily   Continuous Infusions: . feeding supplement (OSMOLITE 1.5 CAL) 1,000 mL (11/07/19 2037)     LOS: 60 days    Time spent: 25 minutes spent in the coordination of care today.    Jonnie Finner, DO [facility] Pager [phone]  If 7PM-7AM, please contact night-coverage [email or web address] Password [email or web address] 11/08/2019, 2:19 PM

## 2019-11-08 NOTE — Progress Notes (Signed)
Patient has redness in L eye. MD notified.

## 2019-11-09 LAB — GLUCOSE, CAPILLARY
Glucose-Capillary: 118 mg/dL — ABNORMAL HIGH (ref 70–99)
Glucose-Capillary: 123 mg/dL — ABNORMAL HIGH (ref 70–99)
Glucose-Capillary: 129 mg/dL — ABNORMAL HIGH (ref 70–99)
Glucose-Capillary: 138 mg/dL — ABNORMAL HIGH (ref 70–99)
Glucose-Capillary: 141 mg/dL — ABNORMAL HIGH (ref 70–99)
Glucose-Capillary: 95 mg/dL (ref 70–99)

## 2019-11-09 MED ORDER — HYDRALAZINE HCL 25 MG PO TABS
25.0000 mg | ORAL_TABLET | Freq: Three times a day (TID) | ORAL | Status: DC
  Administered 2019-11-09 – 2019-11-27 (×53): 25 mg
  Filled 2019-11-09 (×53): qty 1

## 2019-11-09 NOTE — Plan of Care (Signed)
  Problem: Clinical Measurements: Goal: Ability to maintain clinical measurements within normal limits will improve Outcome: Progressing Goal: Will remain free from infection Outcome: Progressing Goal: Diagnostic test results will improve Outcome: Progressing Goal: Respiratory complications will improve Outcome: Progressing Goal: Cardiovascular complication will be avoided Outcome: Progressing   Problem: Activity: Goal: Risk for activity intolerance will decrease Outcome: Progressing   Problem: Nutrition: Goal: Adequate nutrition will be maintained Outcome: Progressing   Problem: Coping: Goal: Level of anxiety will decrease Outcome: Progressing   Problem: Elimination: Goal: Will not experience complications related to bowel motility Outcome: Progressing Goal: Will not experience complications related to urinary retention Outcome: Progressing   Problem: Pain Managment: Goal: General experience of comfort will improve Outcome: Progressing   Problem: Safety: Goal: Ability to remain free from injury will improve Outcome: Progressing   Problem: Skin Integrity: Goal: Risk for impaired skin integrity will decrease Outcome: Progressing   Problem: Clinical Measurements: Goal: Usual level of consciousness will be regained or maintained. Outcome: Progressing Goal: Neurologic status will improve Outcome: Progressing Goal: Ability to maintain intracranial pressure will improve Outcome: Progressing   Problem: Skin Integrity: Goal: Demonstration of wound healing without infection will improve Outcome: Progressing   Problem: Education: Goal: Knowledge of disease or condition will improve Outcome: Progressing Goal: Knowledge of secondary prevention will improve Outcome: Progressing Goal: Knowledge of patient specific risk factors addressed and post discharge goals established will improve Outcome: Progressing Goal: Individualized Educational Video(s) Outcome:  Progressing   Problem: Coping: Goal: Will verbalize positive feelings about self Outcome: Progressing Goal: Will identify appropriate support needs Outcome: Progressing   Problem: Health Behavior/Discharge Planning: Goal: Ability to manage health-related needs will improve Outcome: Progressing   Problem: Self-Care: Goal: Ability to participate in self-care as condition permits will improve Outcome: Progressing Goal: Ability to communicate needs accurately will improve Outcome: Progressing   Problem: Nutrition: Goal: Risk of aspiration will decrease Outcome: Progressing Goal: Dietary intake will improve Outcome: Progressing   Problem: Intracerebral Hemorrhage Tissue Perfusion: Goal: Complications of Intracerebral Hemorrhage will be minimized Outcome: Progressing

## 2019-11-09 NOTE — Progress Notes (Signed)
[name]  PROGRESS NOTE    Joshua [name]  [id number] DOB: [birth year] DOA: 09/09/2019 PCP: Patient, [name]   Brief Narrative:   38 year old male admitted 09/09/[place] smoke shop having had a seizure incontinent bowel and bladder Came to the emergency room and was seen and found to have a large left hemispheric intracranial hemorrhage was rapidly declining neurological examination-left pupil and was not conscious nor following commands he went to surgery for left craniotomy decompressive craniectomy and evacuation of ICH had a PEG tube placed 9/28 secondary to dysphagia he does not follow commands and has been n.p.o.  11/11: HTN-ive ON. Otherwise, no acute issues. 11/12: No acute events ON per nursing.   Assessment & Plan:   Active Problems:   ICH (intracerebral hemorrhage) (HCC)   Subdural hematoma (HCC)   Intracerebral hemorrhage (HCC)   Cytotoxic brain edema (HCC)   Hemorrhagic stroke (HCC)   Acute respiratory failure ([place])   Essential hypertension   Severe protein-calorie malnutrition ([place])   CKD (chronic kidney disease), stage IV (HCC)   Seizure ([place])  Large left intraparenchymal hemorrhage      - status post decompressive craniectomy Dr. Vertell Limber 08/2011     - non-verbal     - supportive care     - PT/OT rec SNF; working on placement, difficult  Seizures; secondary to above     - continue Keppra 500 BID; supportive care  Asp PNA     - s/p PEG tube;  continue feeding supplements/free H2O     - s/p 7-day abx course  CKD 4     - stable, monitor      - watch nephrotoxins     - check labs in AM  HTN     - continue amlodipine 10 mg, metoprolol 100mg  BID     - DBP is still up; add hydralazine 25mg  TID  Probable alcohol withdrawal?     - No signs or symptoms of withdrawal at this time; monitor  Severe protein calorie malnutrition     - TF per PEG as above  No changes ON. Helmet ordered to prevent pt interacting with crani site. Working on placement.    DVT prophylaxis: lovenox Code Status: FULL   Disposition Plan: TBD  Consultants:   Neurology  ROS:  Unable to obtain d/t mentation  Subjective: No acute events ON per nursing.   Objective: Vitals:   11/09/19 0316 11/09/19 0501 11/09/19 0816 11/09/19 1221  BP: (!) 133/97 108/80 113/81 116/88  Pulse: 86  95 95  Resp: 18  17 18   Temp: 98.5 F (36.9 C)  99 F (37.2 C) 98.3 F (36.8 C)  TempSrc: Oral  Oral Oral  SpO2: 100%  100% 100%  Weight: 63 kg     Height:        Intake/Output Summary (Last 24 hours) at 11/09/2019 1441 Last data filed at 11/09/2019 0404 Gross per 24 hour  Intake 14247.17 ml  Output 1700 ml  Net 12547.17 ml   Filed Weights   11/05/19 0552 11/06/19 0411 11/09/19 0316  Weight: 61.7 kg 61.6 kg 63 kg    Examination:  General: 38 y.o. male resting in bed in NAD Cardiovascular: RRR, +S1, S2, no m/g/r Respiratory: CTABL, no w/r/r GI: BS+, NDNT, no masses noted, no organomegaly noted MSK: No e/c/c Neuro: somnolent   Data Reviewed: I have personally reviewed following labs and imaging studies.  CBC: No results for input(s): WBC, NEUTROABS, HGB, HCT, MCV, PLT in the last 168  hours. Basic Metabolic Panel: Recent Labs  Lab 11/04/19 0258 11/07/19 0341  NA 139 136  K 4.2 4.5  CL 99 98  CO2 30 28  GLUCOSE 98 78  BUN 30* 35*  CREATININE 1.52* 1.46*  CALCIUM 10.1 10.2  PHOS 5.4* 5.0*   GFR: Estimated Creatinine Clearance: 60.5 mL/min (A) (by C-G formula based on SCr of 1.46 mg/dL (H)). Liver Function Tests: Recent Labs  Lab 11/04/19 0258 11/07/19 0341  ALBUMIN 3.1* 3.3*   No results for input(s): LIPASE, AMYLASE in the last 168 hours. No results for input(s): AMMONIA in the last 168 hours. Coagulation Profile: No results for input(s): INR, PROTIME in the last 168 hours. Cardiac Enzymes: No results for input(s): CKTOTAL, CKMB, CKMBINDEX, TROPONINI in the last 168 hours. BNP (last 3 results) No results for input(s): PROBNP in the  last 8760 hours. HbA1C: No results for input(s): HGBA1C in the last 72 hours. CBG: Recent Labs  Lab 11/08/19 1954 11/08/19 2313 11/09/19 0337 11/09/19 0812 11/09/19 1207  GLUCAP 117* 125* 141* 123* 129*   Lipid Profile: No results for input(s): CHOL, HDL, LDLCALC, TRIG, CHOLHDL, LDLDIRECT in the last 72 hours. Thyroid Function Tests: No results for input(s): TSH, T4TOTAL, FREET4, T3FREE, THYROIDAB in the last 72 hours. Anemia Panel: No results for input(s): VITAMINB12, FOLATE, FERRITIN, TIBC, IRON, RETICCTPCT in the last 72 hours. Sepsis Labs: No results for input(s): PROCALCITON, LATICACIDVEN in the last 168 hours.  No results found for this or any previous visit (from the past 240 hour(s)).    Radiology Studies: No results found.   Scheduled Meds: . alteplase  2 mg Intracatheter Once  . amLODipine  10 mg Per Tube Daily  . chlorhexidine  15 mL Mouth Rinse BID  . enoxaparin (LOVENOX) injection  40 mg Subcutaneous Daily  . folic acid  1 mg Per Tube Daily  . free water  250 mL Per Tube Q4H  . hydrALAZINE  25 mg Oral Q8H  . insulin aspart  0-15 Units Subcutaneous Q4H  . levETIRAcetam  500 mg Per Tube BID  . mouth rinse  15 mL Mouth Rinse q12n4p  . metoprolol tartrate  100 mg Per Tube BID  . multivitamin  15 mL Per Tube Daily  . pantoprazole sodium  40 mg Per Tube BID  . QUEtiapine  50 mg Per Tube QHS  . sodium chloride flush  10-40 mL Intracatheter Q12H  . thiamine  100 mg Per Tube Daily   Continuous Infusions: . feeding supplement (OSMOLITE 1.5 CAL) 1,000 mL (11/09/19 0405)     LOS: 61 days    Time spent: 25 minutes spent in the coordination of care today.    Jonnie Finner, DO [facility] Pager [phone]  If 7PM-7AM, please contact night-coverage [email or web address] Password [facility] 11/09/2019, 2:41 PM

## 2019-11-10 LAB — CBC WITH DIFFERENTIAL/PLATELET
Abs Immature Granulocytes: 0.04 10*3/uL (ref 0.00–0.07)
Basophils Absolute: 0 10*3/uL (ref 0.0–0.1)
Basophils Relative: 0 %
Eosinophils Absolute: 0.4 10*3/uL (ref 0.0–0.5)
Eosinophils Relative: 5 %
HCT: 39.3 % (ref 39.0–52.0)
Hemoglobin: 12.6 g/dL — ABNORMAL LOW (ref 13.0–17.0)
Immature Granulocytes: 1 %
Lymphocytes Relative: 17 %
Lymphs Abs: 1.4 10*3/uL (ref 0.7–4.0)
MCH: 27.3 pg (ref 26.0–34.0)
MCHC: 32.1 g/dL (ref 30.0–36.0)
MCV: 85.2 fL (ref 80.0–100.0)
Monocytes Absolute: 0.7 10*3/uL (ref 0.1–1.0)
Monocytes Relative: 8 %
Neutro Abs: 5.6 10*3/uL (ref 1.7–7.7)
Neutrophils Relative %: 69 %
Platelets: 339 10*3/uL (ref 150–400)
RBC: 4.61 MIL/uL (ref 4.22–5.81)
RDW: 13.2 % (ref 11.5–15.5)
WBC: 8.1 10*3/uL (ref 4.0–10.5)
nRBC: 0 % (ref 0.0–0.2)

## 2019-11-10 LAB — RENAL FUNCTION PANEL
Albumin: 3.4 g/dL — ABNORMAL LOW (ref 3.5–5.0)
Anion gap: 11 (ref 5–15)
BUN: 34 mg/dL — ABNORMAL HIGH (ref 6–20)
CO2: 26 mmol/L (ref 22–32)
Calcium: 10.2 mg/dL (ref 8.9–10.3)
Chloride: 97 mmol/L — ABNORMAL LOW (ref 98–111)
Creatinine, Ser: 1.35 mg/dL — ABNORMAL HIGH (ref 0.61–1.24)
GFR calc Af Amer: >60 mL/min (ref >60)
GFR calc non Af Amer: >60 mL/min (ref >60)
Glucose, Bld: 133 mg/dL — ABNORMAL HIGH (ref 70–99)
Phosphorus: 4.5 mg/dL (ref 2.5–4.6)
Potassium: 4.3 mmol/L (ref 3.5–5.1)
Sodium: 134 mmol/L — ABNORMAL LOW (ref 135–145)

## 2019-11-10 LAB — GLUCOSE, CAPILLARY
Glucose-Capillary: 101 mg/dL — ABNORMAL HIGH (ref 70–99)
Glucose-Capillary: 102 mg/dL — ABNORMAL HIGH (ref 70–99)
Glucose-Capillary: 108 mg/dL — ABNORMAL HIGH (ref 70–99)
Glucose-Capillary: 118 mg/dL — ABNORMAL HIGH (ref 70–99)
Glucose-Capillary: 130 mg/dL — ABNORMAL HIGH (ref 70–99)
Glucose-Capillary: 131 mg/dL — ABNORMAL HIGH (ref 70–99)

## 2019-11-10 LAB — MAGNESIUM: Magnesium: 1.8 mg/dL (ref 1.7–2.4)

## 2019-11-10 NOTE — Progress Notes (Signed)
Physical Therapy Treatment Patient Details Name: Joshua [name] MRN: [id number] DOB: 03/02/1981 Today's Date: 11/10/2019    History of Present Illness Pt is a 38 y.o. 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 S/p L frontoparietal craniectomy for evacuation of L basal ganglia hematoma 9/12. ETT 9/12-9/18, 9/18-9/23. PEG tube placed 9/28 secondary to dysphagia. PMH includes alcohol abuse, seizures, HTN.    PT Comments    Pt with little to no functional gains since last session.  He did reach for tech's hand x2 and touched his nose with multimodal cueing.  He sat edge of bed x15 min facilitating upright posture and working on cervical extension and rotation to the L and R.  He continues to benefit from long term snf placement.     Follow Up Recommendations  SNF     Equipment Recommendations  Wheelchair (measurements PT);Wheelchair cushion (measurements PT);Hospital bed;3in1 (PT)    Recommendations for Other Services Rehab consult     Precautions / Restrictions Precautions Precautions: Fall Precaution Comments: L crani, PEG, L hand soft mitt Required Braces or Orthoses: Splint/Cast Splint/Cast: resting hand splint and elbow splint for L UE due to contracture Restrictions Weight Bearing Restrictions: No    Mobility  Bed Mobility Overal bed mobility: Needs Assistance Bed Mobility: Supine to Sit;Sit to Supine Rolling: +2 for physical assistance;Max assist Sidelying to sit: Total assist;+2 for physical assistance   Sit to supine: Total assist;+2 for physical assistance;+2 for safety/equipment   General bed mobility comments: assist for trunk elevation/lowering, LE management, scooting to and from EOB, positioning in bed, and come to sitting (heavy truncal extensor tone and pushing).  Transfers                    Ambulation/Gait                 Stairs             Wheelchair Mobility    Modified Rankin  (Stroke Patients Only)       Balance Overall balance assessment: Needs assistance Sitting-balance support: Feet supported;Single extremity supported Sitting balance-Leahy Scale: Poor Sitting balance - Comments: required max assistance to maintain sitting balance x 15 min.  He did do some propping onto his L elbow. Postural control: Right lateral lean;Posterior lean                                  Cognition Arousal/Alertness: Awake/alert Behavior During Therapy: Flat affect;Restless Overall Cognitive Status: Impaired/Different from baseline Area of Impairment: Attention;Following commands;Safety/judgement;Awareness                   Current Attention Level: Focused Memory: Decreased short-term memory Following Commands: Follows one step commands inconsistently Safety/Judgement: Decreased awareness of safety Awareness: Intellectual Problem Solving: Slow processing;Decreased initiation;Difficulty sequencing;Requires verbal cues;Requires tactile cues General Comments: Pt does not visually track PT/OT this session, responds to painful stimuli like PROM RUE with reaching for RUE but no physical expression in face of distress. Pt follows commands very minimally, if at all, able to hold and handle teddy bear and phone today.      Exercises Other Exercises Other Exercises: L cervical rotation and upright positioning of head, total ~2 minutes    General Comments        Pertinent Vitals/Pain Pain Assessment: Faces Faces Pain Scale: Hurts little more Pain Location: neck with gentle ROM Pain  Descriptors / Indicators: Guarding;Grimacing Pain Intervention(s): Monitored during session;Repositioned    Home Living                      Prior Function            PT Goals (current goals can now be found in the care plan section) Acute Rehab PT Goals PT Goal Formulation: Patient unable to participate in goal setting Potential to Achieve Goals:  Fair Progress towards PT goals: Progressing toward goals    Frequency    Min 2X/week      PT Plan Current plan remains appropriate    Co-evaluation              AM-PAC PT "6 Clicks" Mobility   Outcome Measure  Help needed turning from your back to your side while in a flat bed without using bedrails?: Total Help needed moving from lying on your back to sitting on the side of a flat bed without using bedrails?: Total Help needed moving to and from a bed to a chair (including a wheelchair)?: Total Help needed standing up from a chair using your arms (e.g., wheelchair or bedside chair)?: Total Help needed to walk in hospital room?: Total Help needed climbing 3-5 steps with a railing? : Total 6 Click Score: 6    End of Session Equipment Utilized During Treatment: Gait belt Activity Tolerance: Patient limited by fatigue Patient left: with call bell/phone within reach;with bed alarm set;in bed;Other (comment);with family/visitor present Nurse Communication: Mobility status PT Visit Diagnosis: Muscle weakness (generalized) (M62.81);Difficulty in walking, not elsewhere classified (R26.2);Hemiplegia and hemiparesis Hemiplegia - Right/Left: Right Hemiplegia - dominant/non-dominant: Dominant Hemiplegia - caused by: Nontraumatic intracerebral hemorrhage     Time: [id number] PT Time Calculation (min) (ACUTE ONLY): 23 min  Charges:  $Therapeutic Exercise: 8-22 mins $Therapeutic Activity: 8-22 mins                     Joshua [name] , PTA Acute Rehabilitation Services Pager [phone] Office [phone]     Joshua [name] Joshua [name] 11/10/2019, 3:15 PM

## 2019-11-10 NOTE — Progress Notes (Signed)
[name]  PROGRESS NOTE    Joshua [name]  [id number] DOB: 1981/02/24 DOA: 09/09/2019 PCP: Patient, [name]   Brief Narrative:   38 year old male [place] having had a seizure incontinent bowel and bladder Came to the emergency room and was seen and found to have a large left hemispheric intracranial hemorrhage was rapidly declining neurological examination-left pupil and was not conscious nor following commands he went to surgery for left craniotomy decompressive craniectomy and evacuation of ICH had a PEG tube placed 9/28 secondary to dysphagia he does not follow commands and has been n.p.o.  11/11: HTN-ive ON. Otherwise, no acute issues. 11/12: No acute events ON per nursing. 11/13: Helmet is on nightstand. Calm and resting in bed.  Assessment & Plan:   Active Problems:   ICH (intracerebral hemorrhage) (HCC)   Subdural hematoma (HCC)   Intracerebral hemorrhage (HCC)   Cytotoxic brain edema (HCC)   Hemorrhagic stroke (HCC)   Acute respiratory failure ([place])   Essential hypertension   Severe protein-calorie malnutrition ([place])   CKD (chronic kidney disease), stage IV (HCC)   Seizure ([place])  Large left intraparenchymal hemorrhage  - status post decompressive craniectomy Dr. Vertell Limber 08/2011 - non-verbal - supportive care - PT/OT rec SNF     - he is a difficult placement     - helmet ordered for crani  Seizures; secondary to above - continue Keppra 500 BID; supportive care  Asp PNA - s/p PEG tube; continue feeding supplements/free H2O - s/p 7-day abx course  CKD 4 - stable, monitor  - watch nephrotoxins  HTN - continue amlodipine 10 mg, metoprolol 100mg  BID, hydralazine 25mg  TID  Probable alcohol withdrawal? - No signs or symptoms of withdrawal at this time; monitor  Severe protein calorie malnutrition - TF per PEG as above  Difficult placement. Can try increasing hydralazine. See if  he tolerates  DVT prophylaxis: lovenox Code Status: FULL  Disposition Plan: TBD  Consultants:   Neurology  ROS:  Unable to obtain d/t mentation  Subjective: No acute events ON.  Objective: Vitals:   11/09/19 2013 11/09/19 2152 11/09/19 2352 11/10/19 0331  BP: (!) 142/108 (!) 140/109 115/87 (!) 140/103  Pulse: 94  92 87  Resp: 18  17 18   Temp: 97.8 F (36.6 C)  97.8 F (36.6 C) 98 F (36.7 C)  TempSrc: Oral  Oral Oral  SpO2: 100%  99% 100%  Weight:      Height:        Intake/Output Summary (Last 24 hours) at 11/10/2019 0742 Last data filed at 11/10/2019 0514 Gross per 24 hour  Intake 470 ml  Output 1900 ml  Net -1430 ml   Filed Weights   11/05/19 0552 11/06/19 0411 11/09/19 0316  Weight: 61.7 kg 61.6 kg 63 kg    Examination:  General: 38 y.o. male resting in bed in NAD Cardiovascular: RRR, +S1, S2, no m/g/r, equal pulses throughout Respiratory: CTABL, normal WOB GI: BS+, non-distended, non-tender MSK: No e/c/c Neuro: tracks around room. aphasic   Data Reviewed: I have personally reviewed following labs and imaging studies.  CBC: No results for input(s): WBC, NEUTROABS, HGB, HCT, MCV, PLT in the last 168 hours. Basic Metabolic Panel: Recent Labs  Lab 11/04/19 0258 11/07/19 0341  NA 139 136  K 4.2 4.5  CL 99 98  CO2 30 28  GLUCOSE 98 78  BUN 30* 35*  CREATININE 1.52* 1.46*  CALCIUM 10.1 10.2  PHOS 5.4* 5.0*   GFR: Estimated  Creatinine Clearance: 60.5 mL/min (A) (by C-G formula based on SCr of 1.46 mg/dL (H)). Liver Function Tests: Recent Labs  Lab 11/04/19 0258 11/07/19 0341  ALBUMIN 3.1* 3.3*   No results for input(s): LIPASE, AMYLASE in the last 168 hours. No results for input(s): AMMONIA in the last 168 hours. Coagulation Profile: No results for input(s): INR, PROTIME in the last 168 hours. Cardiac Enzymes: No results for input(s): CKTOTAL, CKMB, CKMBINDEX, TROPONINI in the last 168 hours. BNP (last 3 results) No results for  input(s): PROBNP in the last 8760 hours. HbA1C: No results for input(s): HGBA1C in the last 72 hours. CBG: Recent Labs  Lab 11/09/19 1207 11/09/19 1633 11/09/19 2013 11/09/19 2352 11/10/19 0329  GLUCAP 129* 118* 95 138* 101*   Lipid Profile: No results for input(s): CHOL, HDL, LDLCALC, TRIG, CHOLHDL, LDLDIRECT in the last 72 hours. Thyroid Function Tests: No results for input(s): TSH, T4TOTAL, FREET4, T3FREE, THYROIDAB in the last 72 hours. Anemia Panel: No results for input(s): VITAMINB12, FOLATE, FERRITIN, TIBC, IRON, RETICCTPCT in the last 72 hours. Sepsis Labs: No results for input(s): PROCALCITON, LATICACIDVEN in the last 168 hours.  No results found for this or any previous visit (from the past 240 hour(s)).    Radiology Studies: No results found.   Scheduled Meds: . alteplase  2 mg Intracatheter Once  . amLODipine  10 mg Per Tube Daily  . chlorhexidine  15 mL Mouth Rinse BID  . enoxaparin (LOVENOX) injection  40 mg Subcutaneous Daily  . folic acid  1 mg Per Tube Daily  . free water  250 mL Per Tube Q4H  . hydrALAZINE  25 mg Per Tube Q8H  . insulin aspart  0-15 Units Subcutaneous Q4H  . levETIRAcetam  500 mg Per Tube BID  . mouth rinse  15 mL Mouth Rinse q12n4p  . metoprolol tartrate  100 mg Per Tube BID  . multivitamin  15 mL Per Tube Daily  . pantoprazole sodium  40 mg Per Tube BID  . QUEtiapine  50 mg Per Tube QHS  . sodium chloride flush  10-40 mL Intracatheter Q12H  . thiamine  100 mg Per Tube Daily   Continuous Infusions: . feeding supplement (OSMOLITE 1.5 CAL) 1,000 mL (11/09/19 0405)     LOS: 62 days    Time spent: 25 minutes spent in the coordination of care today.    Jonnie Finner, DO [facility] Pager [phone]  If 7PM-7AM, please contact night-coverage [email or web address] Password [facility] 11/10/2019, 7:42 AM

## 2019-11-10 NOTE — Plan of Care (Signed)
  Problem: Clinical Measurements: Goal: Cardiovascular complication will be avoided Outcome: Progressing   Problem: Clinical Measurements: Goal: Diagnostic test results will improve Outcome: Progressing   Problem: Clinical Measurements: Goal: Will remain free from infection Outcome: Progressing

## 2019-11-11 ENCOUNTER — Encounter (HOSPITAL_COMMUNITY): Payer: Self-pay | Admitting: *Deleted

## 2019-11-11 LAB — GLUCOSE, CAPILLARY
Glucose-Capillary: 105 mg/dL — ABNORMAL HIGH (ref 70–99)
Glucose-Capillary: 120 mg/dL — ABNORMAL HIGH (ref 70–99)
Glucose-Capillary: 122 mg/dL — ABNORMAL HIGH (ref 70–99)
Glucose-Capillary: 111 mg/dL — ABNORMAL HIGH (ref 70–99)
Glucose-Capillary: 118 mg/dL — ABNORMAL HIGH (ref 70–99)

## 2019-11-11 NOTE — Progress Notes (Signed)
[name]  PROGRESS NOTE    Joshua [name]  [id number] DOB: [birth year] DOA: 09/09/2019 PCP: Patient, [name]   Brief Narrative:   38 year old male admitted 09[place]09/[place] shop having had a seizure incontinent bowel and bladder Came to the emergency room and was seen and found to have a large left hemispheric intracranial hemorrhage was rapidly declining neurological examination-left pupil and was not conscious nor following commands he went to surgery for left craniotomy decompressive craniectomy and evacuation of ICH had a PEG tube placed 9/28 secondary to dysphagia he does not follow commands and has been n.p.o.  11/11: HTN-ive ON. Otherwise, no acute issues. 11/12: No acute events ON per nursing. 11/13: Helmet is on nightstand. Calm and resting in bed. 11/14: No acute events ON per nursing.    Assessment & Plan:   Active Problems:   ICH (intracerebral hemorrhage) (HCC)   Subdural hematoma (HCC)   Intracerebral hemorrhage (HCC)   Cytotoxic brain edema (HCC)   Hemorrhagic stroke (HCC)   Acute respiratory failure ([place])   Essential hypertension   Severe protein-calorie malnutrition ([place])   CKD (chronic kidney disease), stage IV (HCC)   Seizure ([place])  Large left intraparenchymal hemorrhage  - status post decompressive craniectomy Dr. Vertell Limber 08/2011 - non-verbal - supportive care - PT/OT rec SNF     - he is a difficult placement     - helmet ordered for crani     - stable, no changes for today  Seizures; secondary to above - continue Keppra 500 BID; supportive care     - stable, no changes  Asp PNA - s/p PEG tube; continue feeding supplements/free H2O - s/p 7-day abx course  CKD 4 - stable, monitor  - watch nephrotoxins  HTN - continue amlodipine 10 mg, metoprolol 100mg  BID, hydralazine 25mg  TID     - BP is ok this AM  Probable alcohol withdrawal? - No signs or symptoms of withdrawal at this time; monitor   Severe protein calorie malnutrition - TF per PEG as above  Still no bed offers. Continue as per above.   DVT prophylaxis: lovenox Code Status: FULL   Disposition Plan: TBD  Consultants:   Neurology  ROS:  Unable to obtain d/t mentation.  Subjective: Calmly resting in bed. No acute events.  Objective: Vitals:   11/10/19 2153 11/10/19 2338 11/11/19 0355 11/11/19 0749  BP: (!) 139/106 (!) 121/100 117/87 (!) 145/85  Pulse:  79 89 (!) 105  Resp:  17 16 16   Temp:  97.7 F (36.5 C) 98.3 F (36.8 C) 98 F (36.7 C)  TempSrc:  Oral Oral Axillary  SpO2:  100% 100% 98%  Weight:      Height:        Intake/Output Summary (Last 24 hours) at 11/11/2019 0751 Last data filed at 11/11/2019 0011 Gross per 24 hour  Intake 420 ml  Output 700 ml  Net -280 ml   Filed Weights   11/05/19 0552 11/06/19 0411 11/09/19 0316  Weight: 61.7 kg 61.6 kg 63 kg    Examination:  General: 38 y.o. male resting in bed in NAD Cardiovascular: RRR, +S1, S2, no m/g/r Respiratory: CTABL, no w/r/r GI: BS+, NDNT, no masses noted, no organomegaly noted MSK: No e/c/c Neuro: aphasic; calm, tracking around room.   Data Reviewed: I have personally reviewed following labs and imaging studies.  CBC: Recent Labs  Lab 11/10/19 0752  WBC 8.1  NEUTROABS 5.6  HGB 12.6*  HCT 39.3  MCV 85.2  PLT [id number]   Basic Metabolic Panel: Recent Labs  Lab 11/07/19 0341 11/10/19 0752  NA 136 134*  K 4.5 4.3  CL 98 97*  CO2 28 26  GLUCOSE 78 133*  BUN 35* 34*  CREATININE 1.46* 1.35*  CALCIUM 10.2 10.2  MG  --  1.8  PHOS 5.0* 4.5   GFR: Estimated Creatinine Clearance: 65.5 mL/min (A) (by C-G formula based on SCr of 1.35 mg/dL (H)). Liver Function Tests: Recent Labs  Lab 11/07/19 0341 11/10/19 0752  ALBUMIN 3.3* 3.4*   No results for input(s): LIPASE, AMYLASE in the last 168 hours. No results for input(s): AMMONIA in the last 168 hours. Coagulation Profile: No results for input(s): INR,  PROTIME in the last 168 hours. Cardiac Enzymes: No results for input(s): CKTOTAL, CKMB, CKMBINDEX, TROPONINI in the last 168 hours. BNP (last 3 results) No results for input(s): PROBNP in the last 8760 hours. HbA1C: No results for input(s): HGBA1C in the last 72 hours. CBG: Recent Labs  Lab 11/10/19 1209 11/10/19 1657 11/10/19 2028 11/10/19 2337 11/11/19 0354  GLUCAP 102* 108* 118* 130* 105*   Lipid Profile: No results for input(s): CHOL, HDL, LDLCALC, TRIG, CHOLHDL, LDLDIRECT in the last 72 hours. Thyroid Function Tests: No results for input(s): TSH, T4TOTAL, FREET4, T3FREE, THYROIDAB in the last 72 hours. Anemia Panel: No results for input(s): VITAMINB12, FOLATE, FERRITIN, TIBC, IRON, RETICCTPCT in the last 72 hours. Sepsis Labs: No results for input(s): PROCALCITON, LATICACIDVEN in the last 168 hours.  No results found for this or any previous visit (from the past 240 hour(s)).    Radiology Studies: No results found.   Scheduled Meds: . alteplase  2 mg Intracatheter Once  . amLODipine  10 mg Per Tube Daily  . chlorhexidine  15 mL Mouth Rinse BID  . enoxaparin (LOVENOX) injection  40 mg Subcutaneous Daily  . folic acid  1 mg Per Tube Daily  . free water  250 mL Per Tube Q4H  . hydrALAZINE  25 mg Per Tube Q8H  . insulin aspart  0-15 Units Subcutaneous Q4H  . levETIRAcetam  500 mg Per Tube BID  . mouth rinse  15 mL Mouth Rinse q12n4p  . metoprolol tartrate  100 mg Per Tube BID  . multivitamin  15 mL Per Tube Daily  . pantoprazole sodium  40 mg Per Tube BID  . QUEtiapine  50 mg Per Tube QHS  . sodium chloride flush  10-40 mL Intracatheter Q12H  . thiamine  100 mg Per Tube Daily   Continuous Infusions: . feeding supplement (OSMOLITE 1.5 CAL) 1,000 mL (11/11/19 0349)     LOS: 63 days    Time spent: 15 minutes spent in the coordination of care today.    Jonnie Finner, DO [facility] Pager [phone]  If 7PM-7AM, please contact night-coverage  [email or web address] Password [email or web address] 11/11/2019, 7:51 AM

## 2019-11-12 LAB — GLUCOSE, CAPILLARY
Glucose-Capillary: 107 mg/dL — ABNORMAL HIGH (ref 70–99)
Glucose-Capillary: 110 mg/dL — ABNORMAL HIGH (ref 70–99)
Glucose-Capillary: 121 mg/dL — ABNORMAL HIGH (ref 70–99)
Glucose-Capillary: 125 mg/dL — ABNORMAL HIGH (ref 70–99)
Glucose-Capillary: 98 mg/dL (ref 70–99)
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
## 2019-11-12 NOTE — TOC Progression Note (Signed)
Transition of Care [facility]) - Progression Note    Patient Details  Name: Joshua [name] MRN: [id number] Date of Birth: 1981/10/31  Transition of Care [facility]) CM/SW [place], [place] Phone Number: 11/12/2019, 9:37 AM  Clinical Narrative:     No current bed offers. CSW will continue to assist with placement.   Expected Discharge Plan: [place] Barriers to Discharge: Continued Medical Work up, SNF Pending payor source - LOG  Expected Discharge Plan and Services Expected Discharge Plan: [place] In-house Referral: Clinical Social Work Discharge Planning Services: NA Post Acute Care Choice: [place] Living arrangements for the past 2 months: Apartment                 DME Arranged: N/A DME Agency: NA       HH Arranged: NA HH Agency: NA         Social Determinants of Health (SDOH) Interventions    Readmission Risk Interventions No flowsheet data found.

## 2019-11-13 LAB — GLUCOSE, CAPILLARY
Glucose-Capillary: 109 mg/dL — ABNORMAL HIGH (ref 70–99)
Glucose-Capillary: 122 mg/dL — ABNORMAL HIGH (ref 70–99)
Glucose-Capillary: 124 mg/dL — ABNORMAL HIGH (ref 70–99)
Glucose-Capillary: 92 mg/dL (ref 70–99)
Glucose-Capillary: 93 mg/dL (ref 70–99)
Glucose-Capillary: 95 mg/dL (ref 70–99)
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
## 2019-11-13 NOTE — Progress Notes (Signed)
Nutrition Follow-up  DOCUMENTATION CODES:   Not applicable  INTERVENTION:  - continue Osmolite 1.5 @ 70 ml/hr with 250 ml free water every 4 hours.    NUTRITION DIAGNOSIS:   Inadequate oral intake related to inability to eat as evidenced by NPO status. -ongoing  GOAL:   Patient will meet greater than or equal to 90% of their needs -met with TF regimen  MONITOR:   TF tolerance, Weight trends  ASSESSMENT:   37 yo male admitted with large left intracranial hemorrhage; S/P craniotomy. PMH includes HTN, alcoholism, noncompliance.  Significant Events: 9/12- L frontal temporal crani and evacuation of hematoma 9/23- extubation 10/1- PEG placement 11/4- SLP signed off   Patient remains NPO with PEG in place. He is receiving Osmolite 1.5 @ 70 ml/hr with 250 ml free water every 4 hours. This regimen is providing 2520 kcal, 105 grams protein, and 2780 ml free water.    Per notes: - non-verbal - PT and OT recommending SNF--difficult placement, disposition plan TBD - seizures--stable - aspiration PNA s/p PEG and NPO status - stage 4 CKD - severe PCM--TF via PEG   Labs reviewed; CBGs: 92 and 122 mg/dl. Medications reviewed; 1 mg folvite/day, sliding scale novolog, 15 ml liquid multivitamin/day, 100 mg thiamine/day.     Diet Order:   Diet Order            Diet NPO time specified  Diet effective midnight              EDUCATION NEEDS:   Not appropriate for education at this time  Skin:  Skin Assessment: Reviewed RN Assessment  Last BM:  11/15  Height:   Ht Readings from Last 1 Encounters:  09/09/19 5' 9" (1.753 m)    Weight:   Wt Readings from Last 1 Encounters:  11/12/19 57.7 kg    Ideal Body Weight:  72.7 kg  BMI:  Body mass index is 18.78 kg/m.  Estimated Nutritional Needs:   Kcal:  2100-2400  Protein:  105-125 grams  Fluid:  >/= 2 L/day      , MS, RD, LDN, CNSC Inpatient Clinical Dietitian Pager # [phone] After  hours/weekend pager # [phone]  

## 2019-11-14 LAB — GLUCOSE, CAPILLARY
Glucose-Capillary: 104 mg/dL — ABNORMAL HIGH (ref 70–99)
Glucose-Capillary: 107 mg/dL — ABNORMAL HIGH (ref 70–99)
Glucose-Capillary: 109 mg/dL — ABNORMAL HIGH (ref 70–99)
Glucose-Capillary: 120 mg/dL — ABNORMAL HIGH (ref 70–99)
Glucose-Capillary: 128 mg/dL — ABNORMAL HIGH (ref 70–99)
Glucose-Capillary: 129 mg/dL — ABNORMAL HIGH (ref 70–99)
Glucose-Capillary: 135 mg/dL — ABNORMAL HIGH (ref 70–99)
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
## 2019-11-15 LAB — GLUCOSE, CAPILLARY
Glucose-Capillary: 116 mg/dL — ABNORMAL HIGH (ref 70–99)
Glucose-Capillary: 118 mg/dL — ABNORMAL HIGH (ref 70–99)
Glucose-Capillary: 120 mg/dL — ABNORMAL HIGH (ref 70–99)
Glucose-Capillary: 130 mg/dL — ABNORMAL HIGH (ref 70–99)
Glucose-Capillary: 147 mg/dL — ABNORMAL HIGH (ref 70–99)
Glucose-Capillary: 98 mg/dL (ref 70–99)

## 2019-11-15 NOTE — Progress Notes (Signed)
PROGRESS NOTE    Joshua [name]  [id number] DOB: 1981-12-05 DOA: 09/09/2019 PCP: Patient, [name]    Brief Narrative:  38 year old male admitted for left hemispheric intracranial hemorrhage s/p left craniotomy for decompression of ICH. S/p PEG tube on 9/28 and currently waiting for placement at rehab facility as he is not safe for discharge home.  Assessment & Plan:   Active Problems:   ICH (intracerebral hemorrhage) (HCC)   Subdural hematoma (HCC)   Intracerebral hemorrhage (HCC)   Cytotoxic brain edema (HCC)   Hemorrhagic stroke (HCC)   Acute respiratory failure ([place])   Essential hypertension   Severe protein-calorie malnutrition (HCC)   CKD (chronic kidney disease), stage IV (HCC)   Seizure (HCC)   Left intracranial hemorrhage s/p decompressive craniotomy by Dr. Vertell Limber in September with associated seizures. Currently he is seizure-free on Keppra 500 mg twice daily but he is nonverbal.  PT evaluation recommending SNF for placement as he is not safe for discharge home.    Aspiration pneumonia Completed 7-day course of antibiotics S/p PEG tube for dysphagia.    Essential hypertension Well-controlled blood pressure parameters continue with Norvasc, metoprolol and hydralazine.  .  Severe protein calorie malnutrition Tube feeds via PEG tube further recommendations as per nutritionist.       DVT prophylaxis: Lovenox Code Status: Full code Family Communication: (None at bedside Disposition Plan: Pending placement, not safe for discharge home  Consultants:   Neurosurgery  Procedures: (S/p decompressive craniotomy Antimicrobials: None  Subjective: Nonverbal Objective: Vitals:   11/14/19 2018 11/14/19 2348 11/15/19 0407 11/15/19 0818  BP: 127/86 111/78 105/72 112/76  Pulse: (!) 104 91 98 98  Resp: 17 18 17 16   Temp: (!) 97.5 F (36.4 C) 98.1 F (36.7 C) (!) 97.5 F (36.4 C) 99.1 F (37.3 C)  TempSrc: Oral Oral Oral Axillary  SpO2: 100% 100%  100% 100%  Weight:      Height:        Intake/Output Summary (Last 24 hours) at 11/15/2019 1123 Last data filed at 11/15/2019 0407 Gross per 24 hour  Intake -  Output 1425 ml  Net -1425 ml   Filed Weights   11/06/19 0411 11/09/19 0316 11/12/19 0532  Weight: 61.6 kg 63 kg 57.7 kg    Examination:  General exam: Appears calm and comfortable  Respiratory system: Clear to auscultation. Respiratory effort normal. Cardiovascular system: S1 & S2 heard, RRR. Gastrointestinal system: Abdomen is nondistended, soft and nontender.  S/p PEG placement Central nervous system: Alert and nonverbal Extremities: Symmetric 5 x 5 power. Skin: No rashes, lesions or ulcers Psychiatry: Calm    Data Reviewed: I have personally reviewed following labs and imaging studies  CBC: Recent Labs  Lab 11/10/19 0752  WBC 8.1  NEUTROABS 5.6  HGB 12.6*  HCT 39.3  MCV 85.2  PLT 99991111   Basic Metabolic Panel: Recent Labs  Lab 11/10/19 0752  NA 134*  K 4.3  CL 97*  CO2 26  GLUCOSE 133*  BUN 34*  CREATININE 1.35*  CALCIUM 10.2  MG 1.8  PHOS 4.5   GFR: Estimated Creatinine Clearance: 60 mL/min (A) (by C-G formula based on SCr of 1.35 mg/dL (H)). Liver Function Tests: Recent Labs  Lab 11/10/19 0752  ALBUMIN 3.4*   No results for input(s): LIPASE, AMYLASE in the last 168 hours. No results for input(s): AMMONIA in the last 168 hours. Coagulation Profile: No results for input(s): INR, PROTIME in the last 168 hours. Cardiac Enzymes: No results for input(s): CKTOTAL,  CKMB, CKMBINDEX, TROPONINI in the last 168 hours. BNP (last 3 results) No results for input(s): PROBNP in the last 8760 hours. HbA1C: No results for input(s): HGBA1C in the last 72 hours. CBG: Recent Labs  Lab 11/14/19 1700 11/14/19 2019 11/14/19 2349 11/15/19 0408 11/15/19 0820  GLUCAP 109* 128* 116* 147* 130*   Lipid Profile: No results for input(s): CHOL, HDL, LDLCALC, TRIG, CHOLHDL, LDLDIRECT in the last 72 hours.  Thyroid Function Tests: No results for input(s): TSH, T4TOTAL, FREET4, T3FREE, THYROIDAB in the last 72 hours. Anemia Panel: No results for input(s): VITAMINB12, FOLATE, FERRITIN, TIBC, IRON, RETICCTPCT in the last 72 hours. Sepsis Labs: No results for input(s): PROCALCITON, LATICACIDVEN in the last 168 hours.  No results found for this or any previous visit (from the past 240 hour(s)).       Radiology Studies: No results found.      Scheduled Meds: . alteplase  2 mg Intracatheter Once  . amLODipine  10 mg Per Tube Daily  . chlorhexidine  15 mL Mouth Rinse BID  . enoxaparin (LOVENOX) injection  40 mg Subcutaneous Daily  . folic acid  1 mg Per Tube Daily  . free water  250 mL Per Tube Q4H  . hydrALAZINE  25 mg Per Tube Q8H  . insulin aspart  0-15 Units Subcutaneous Q4H  . levETIRAcetam  500 mg Per Tube BID  . mouth rinse  15 mL Mouth Rinse q12n4p  . metoprolol tartrate  100 mg Per Tube BID  . multivitamin  15 mL Per Tube Daily  . pantoprazole sodium  40 mg Per Tube BID  . QUEtiapine  50 mg Per Tube QHS  . sodium chloride flush  10-40 mL Intracatheter Q12H  . thiamine  100 mg Per Tube Daily   Continuous Infusions: . feeding supplement (OSMOLITE 1.5 CAL) 1,000 mL (11/15/19 0156)     LOS: 67 days        Hosie Poisson, MD [facility] 11/15/2019, 11:23 AM

## 2019-11-15 NOTE — Progress Notes (Signed)
PEG tube leaking. Tegaderm placed over hole. Able to give medications and tube feedings. Dr. Karleen Hampshire notified. No new orders at this time. Will continue to monitor. Joshua [name]

## 2019-11-15 NOTE — Progress Notes (Signed)
Update pt's mom on visitation policy and request for other family members to abide by the rules of only one visitor at a time for the day and any visitor must be screened at entrance per policy. She appreciated the update.

## 2019-11-15 NOTE — Progress Notes (Addendum)
Notified by RN of patient having 2 visitors at bedside along with concerns brought by multiple staff members that multiple family members have been coming in without a green armband on. This Probation officer talked to both visitors and notified them that only 1 visitor is allowed at the bedside and asked 1 of them to leave. The patient's girlfriend stated she would leave.   Talked again to pt.'s nurse and notified that the visitor at the bedside does not have a green armband on. This Probation officer asked him where his armband was and he stated that he "left it in the car." Explained to him that he is not supposed to be leaving and coming back and that he needed to go back downstairs to be rescreened as the band is evidence of screening.   This Probation officer also called and d/w security who stated that only a male checked in this morning. Security called back and stated that the gentleman down there was not checked in and asked if we wanted to allow him back up. I asked that he be told he can not return since he never checked in. Security agreed.

## 2019-11-16 LAB — CBC
HCT: 38.1 % — ABNORMAL LOW (ref 39.0–52.0)
Hemoglobin: 12.2 g/dL — ABNORMAL LOW (ref 13.0–17.0)
MCH: 27.4 pg (ref 26.0–34.0)
MCHC: 32 g/dL (ref 30.0–36.0)
MCV: 85.6 fL (ref 80.0–100.0)
Platelets: 305 10*3/uL (ref 150–400)
RBC: 4.45 MIL/uL (ref 4.22–5.81)
RDW: 13.5 % (ref 11.5–15.5)
WBC: 6.4 10*3/uL (ref 4.0–10.5)
nRBC: 0 % (ref 0.0–0.2)

## 2019-11-16 LAB — BASIC METABOLIC PANEL
Anion gap: 12 (ref 5–15)
BUN: 30 mg/dL — ABNORMAL HIGH (ref 6–20)
CO2: 28 mmol/L (ref 22–32)
Calcium: 9.8 mg/dL (ref 8.9–10.3)
Chloride: 98 mmol/L (ref 98–111)
Creatinine, Ser: 1.41 mg/dL — ABNORMAL HIGH (ref 0.61–1.24)
GFR calc Af Amer: >60 mL/min (ref >60)
GFR calc non Af Amer: >60 mL/min (ref >60)
Glucose, Bld: 109 mg/dL — ABNORMAL HIGH (ref 70–99)
Potassium: 4.6 mmol/L (ref 3.5–5.1)
Sodium: 138 mmol/L (ref 135–145)

## 2019-11-16 LAB — GLUCOSE, CAPILLARY
Glucose-Capillary: 112 mg/dL — ABNORMAL HIGH (ref 70–99)
Glucose-Capillary: 112 mg/dL — ABNORMAL HIGH (ref 70–99)
Glucose-Capillary: 114 mg/dL — ABNORMAL HIGH (ref 70–99)
Glucose-Capillary: 119 mg/dL — ABNORMAL HIGH (ref 70–99)
Glucose-Capillary: 121 mg/dL — ABNORMAL HIGH (ref 70–99)
Glucose-Capillary: 124 mg/dL — ABNORMAL HIGH (ref 70–99)
Glucose-Capillary: 126 mg/dL — ABNORMAL HIGH (ref 70–99)

## 2019-11-16 NOTE — Progress Notes (Signed)
PROGRESS NOTE    Joshua [name]  [id number] DOB: 1981-01-18 DOA: 09/09/2019 PCP: Patient, [name]    Brief Narrative:  38 year old male admitted for left hemispheric intracranial hemorrhage s/p left craniotomy for decompression of ICH. S/p PEG tube on 9/28 and currently waiting for placement at rehab facility as he is not safe for discharge home. No new events today.   Assessment & Plan:   Active Problems:   ICH (intracerebral hemorrhage) (HCC)   Subdural hematoma (HCC)   Intracerebral hemorrhage (HCC)   Cytotoxic brain edema (HCC)   Hemorrhagic stroke (HCC)   Acute respiratory failure ([place])   Essential hypertension   Severe protein-calorie malnutrition (HCC)   CKD (chronic kidney disease), stage IV (HCC)   Seizure (HCC)   Left intracranial hemorrhage s/p decompressive craniectomy and hematoma evacuation and implantation of skull flap in the right abdomen by Dr. Vertell Limber in September with associated seizures. Currently he is seizure-free and is  on Keppra 500 mg twice daily.   PT evaluation recommending SNF for placement as he is not safe for discharge home. Unfortunately patient is  Aphasic and non verbal. No family at bedside.     Aspiration pneumonia Completed 7-day course of antibiotics. No new events.  S/p PEG tube for dysphagia.    Essential hypertension Well controlled. Resume home meds.    Severe protein calorie malnutrition Tube feeds via PEG tube at the rate of 51ml/hr.   further recommendations as per nutritionist.    h/o alcohol use  No signs of withdrawal.    Stage 4 CKD:  Creatinine at 1.4 and stable.    Mild anemia of chronic disease: Hemoglobin stable around 12.       DVT prophylaxis: Lovenox Code Status: Full code Family Communication: (None at bedside Disposition Plan: Pending placement, not safe for discharge home  Consultants:   Neurosurgery  Procedures: (S/p decompressive craniectomy, hematoma evacuation.   Antimicrobials: None  Subjective: Nonverbal, aphasic, appears comfortable.   Objective: Vitals:   11/16/19 0829 11/16/19 0829 11/16/19 1127 11/16/19 1531  BP: 126/74 126/74 119/87 (!) 132/96  Pulse: (!) 106 (!) 106 93 97  Resp: 20 20 16 16   Temp: 99.1 F (37.3 C) 99.1 F (37.3 C) 98.6 F (37 C) 98.8 F (37.1 C)  TempSrc: Oral Oral Oral Oral  SpO2: 100% 100% 99% 100%  Weight:      Height:        Intake/Output Summary (Last 24 hours) at 11/16/2019 1629 Last data filed at 11/16/2019 1535 Gross per 24 hour  Intake 150 ml  Output 1550 ml  Net -1400 ml   Filed Weights   11/06/19 0411 11/09/19 0316 11/12/19 0532  Weight: 61.6 kg 63 kg 57.7 kg    Examination:  General exam: appears comfortable.  Respiratory system: Air entry fair, no wheezing or rhonchi.  Cardiovascular system: S1S2, RRR, no JVD,  Gastrointestinal system: abd is soft, non tender, non distended bowel sounds normal. PEG in place.  Central nervous system: non verbal.  Extremities: no cyanosis or clubbing.  Skin: no lesions.  Psychiatry: calm.     Data Reviewed: I have personally reviewed following labs and imaging studies  CBC: Recent Labs  Lab 11/10/19 0752 11/16/19 1029  WBC 8.1 6.4  NEUTROABS 5.6  --   HGB 12.6* 12.2*  HCT 39.3 38.1*  MCV 85.2 85.6  PLT [id number]   Basic Metabolic Panel: Recent Labs  Lab 11/10/19 0752 11/16/19 1029  NA 134* 138  K 4.3 4.6  CL 97* 98  CO2 26 28  GLUCOSE 133* 109*  BUN 34* 30*  CREATININE 1.35* 1.41*  CALCIUM 10.2 9.8  MG 1.8  --   PHOS 4.5  --    GFR: Estimated Creatinine Clearance: 57.4 mL/min (A) (by C-G formula based on SCr of 1.41 mg/dL (H)). Liver Function Tests: Recent Labs  Lab 11/10/19 0752  ALBUMIN 3.4*   No results for input(s): LIPASE, AMYLASE in the last 168 hours. No results for input(s): AMMONIA in the last 168 hours. Coagulation Profile: No results for input(s): INR, PROTIME in the last 168 hours. Cardiac Enzymes: No  results for input(s): CKTOTAL, CKMB, CKMBINDEX, TROPONINI in the last 168 hours. BNP (last 3 results) No results for input(s): PROBNP in the last 8760 hours. HbA1C: No results for input(s): HGBA1C in the last 72 hours. CBG: Recent Labs  Lab 11/15/19 2024 11/16/19 0013 11/16/19 0416 11/16/19 0830 11/16/19 1126  GLUCAP 120* 126* 114* 112* 124*   Lipid Profile: No results for input(s): CHOL, HDL, LDLCALC, TRIG, CHOLHDL, LDLDIRECT in the last 72 hours. Thyroid Function Tests: No results for input(s): TSH, T4TOTAL, FREET4, T3FREE, THYROIDAB in the last 72 hours. Anemia Panel: No results for input(s): VITAMINB12, FOLATE, FERRITIN, TIBC, IRON, RETICCTPCT in the last 72 hours. Sepsis Labs: No results for input(s): PROCALCITON, LATICACIDVEN in the last 168 hours.  No results found for this or any previous visit (from the past 240 hour(s)).       Radiology Studies: No results found.      Scheduled Meds: . alteplase  2 mg Intracatheter Once  . amLODipine  10 mg Per Tube Daily  . chlorhexidine  15 mL Mouth Rinse BID  . enoxaparin (LOVENOX) injection  40 mg Subcutaneous Daily  . folic acid  1 mg Per Tube Daily  . free water  250 mL Per Tube Q4H  . hydrALAZINE  25 mg Per Tube Q8H  . insulin aspart  0-15 Units Subcutaneous Q4H  . levETIRAcetam  500 mg Per Tube BID  . mouth rinse  15 mL Mouth Rinse q12n4p  . metoprolol tartrate  100 mg Per Tube BID  . multivitamin  15 mL Per Tube Daily  . pantoprazole sodium  40 mg Per Tube BID  . QUEtiapine  50 mg Per Tube QHS  . sodium chloride flush  10-40 mL Intracatheter Q12H  . thiamine  100 mg Per Tube Daily   Continuous Infusions: . feeding supplement (OSMOLITE 1.5 CAL) 1,000 mL (11/16/19 0911)     LOS: 68 days        Hosie Poisson, MD [facility] 11/16/2019, 4:29 PM

## 2019-11-17 LAB — GLUCOSE, CAPILLARY
Glucose-Capillary: 97 mg/dL (ref 70–99)
Glucose-Capillary: 115 mg/dL — ABNORMAL HIGH (ref 70–99)
Glucose-Capillary: 129 mg/dL — ABNORMAL HIGH (ref 70–99)
Glucose-Capillary: 97 mg/dL (ref 70–99)
Glucose-Capillary: 101 mg/dL — ABNORMAL HIGH (ref 70–99)
Glucose-Capillary: 107 mg/dL — ABNORMAL HIGH (ref 70–99)

## 2019-11-17 NOTE — Plan of Care (Signed)
Patient progressing towards plan of care goals. 

## 2019-11-17 NOTE — Progress Notes (Signed)
PROGRESS NOTE    Joshua [name]  [id number] DOB: [birth year] DOA: 09/09/2019 PCP: Patient, [name]    Brief Narrative:  38 year old male admitted for left hemispheric intracranial hemorrhage s/p left craniotomy for decompression of ICH. S/p PEG tube on 9/28 and currently waiting for placement at rehab facility as he is not safe for discharge home. No new events today. Family at bedside and he is able to track her.   Assessment & Plan:   Active Problems:   ICH (intracerebral hemorrhage) (HCC)   Subdural hematoma (HCC)   Intracerebral hemorrhage (HCC)   Cytotoxic brain edema (HCC)   Hemorrhagic stroke (HCC)   Acute respiratory failure ([place])   Essential hypertension   Severe protein-calorie malnutrition (HCC)   CKD (chronic kidney disease), stage IV (HCC)   Seizure (HCC)   Left intracranial hemorrhage s/p decompressive craniectomy and hematoma evacuation and implantation of skull flap in the right abdomen by Dr. Vertell Limber in September with associated seizures. Currently he is seizure-free and is  on Keppra 500 mg twice daily.   PT evaluation recommending SNF for placement as he is not safe for discharge home. Unfortunately patient is  Aphasic and non verbal. Family at bedside today and he is able to track her movements.  He appears comfortable and no seizures in the last 24 hours.     Aspiration pneumonia Completed 7-day course of antibiotics. No new events.  S/p PEG tube for dysphagia. No coughing today.     Essential hypertension Well controlled. Resume home meds.    Severe protein calorie malnutrition Tube feeds via PEG tube at the rate of 15ml/hr.   further recommendations as per nutritionist.    h/o alcohol use  No signs of withdrawal.    Stage 4 CKD:  Creatinine at 1.4 and stable.    Mild anemia of chronic disease: Hemoglobin stable around 12.             DVT prophylaxis: Lovenox Code Status: Full code Family Communication: (None at bedside  Disposition Plan: Pending placement, not safe for discharge home  Consultants:   Neurosurgery  Procedures: S/p decompressive craniectomy, hematoma evacuation.  Antimicrobials: None  Subjective: Non verbal, appears comfortable. No new complaints.   Objective: Vitals:   11/17/19 0315 11/17/19 0439 11/17/19 0800 11/17/19 1200  BP: 114/86  123/76 122/88  Pulse: 94  97 91  Resp: 19  18 18   Temp: 97.7 F (36.5 C)  97.8 F (36.6 C)   TempSrc: Oral  Oral   SpO2: 99%  99%   Weight:  57.9 kg    Height:        Intake/Output Summary (Last 24 hours) at 11/17/2019 1446 Last data filed at 11/17/2019 1000 Gross per 24 hour  Intake 620 ml  Output 1750 ml  Net -1130 ml   Filed Weights   11/09/19 0316 11/12/19 0532 11/17/19 0439  Weight: 63 kg 57.7 kg 57.9 kg    Examination:  General exam:  Cachetic looking. Not in distress.  Respiratory system: air entry fair , no wheezing or rhonchi.  Cardiovascular system: s1s2, RRR, no JVD.  Gastrointestinal system: abd is soft, NT ND BS+ PEG In place.  Central nervous system: non verbal .  Extremities: no cyanosis or clubbing. .  Skin: no lesions.  Psychiatry: calm    Data Reviewed: I have personally reviewed following labs and imaging studies  CBC: Recent Labs  Lab 11/16/19 1029  WBC 6.4  HGB 12.2*  HCT 38.1*  MCV 85.6  PLT 123456   Basic Metabolic Panel: Recent Labs  Lab 11/16/19 1029  NA 138  K 4.6  CL 98  CO2 28  GLUCOSE 109*  BUN 30*  CREATININE 1.41*  CALCIUM 9.8   GFR: Estimated Creatinine Clearance: 57.6 mL/min (A) (by C-G formula based on SCr of 1.41 mg/dL (H)). Liver Function Tests: No results for input(s): AST, ALT, ALKPHOS, BILITOT, PROT, ALBUMIN in the last 168 hours. No results for input(s): LIPASE, AMYLASE in the last 168 hours. No results for input(s): AMMONIA in the last 168 hours. Coagulation Profile: No results for input(s): INR, PROTIME in the last 168 hours. Cardiac Enzymes: No results for  input(s): CKTOTAL, CKMB, CKMBINDEX, TROPONINI in the last 168 hours. BNP (last 3 results) No results for input(s): PROBNP in the last 8760 hours. HbA1C: No results for input(s): HGBA1C in the last 72 hours. CBG: Recent Labs  Lab 11/16/19 2003 11/16/19 2326 11/17/19 0309 11/17/19 0804 11/17/19 1125  GLUCAP 112* 119* 97 115* 129*   Lipid Profile: No results for input(s): CHOL, HDL, LDLCALC, TRIG, CHOLHDL, LDLDIRECT in the last 72 hours. Thyroid Function Tests: No results for input(s): TSH, T4TOTAL, FREET4, T3FREE, THYROIDAB in the last 72 hours. Anemia Panel: No results for input(s): VITAMINB12, FOLATE, FERRITIN, TIBC, IRON, RETICCTPCT in the last 72 hours. Sepsis Labs: No results for input(s): PROCALCITON, LATICACIDVEN in the last 168 hours.  No results found for this or any previous visit (from the past 240 hour(s)).       Radiology Studies: No results found.      Scheduled Meds: . alteplase  2 mg Intracatheter Once  . amLODipine  10 mg Per Tube Daily  . chlorhexidine  15 mL Mouth Rinse BID  . enoxaparin (LOVENOX) injection  40 mg Subcutaneous Daily  . folic acid  1 mg Per Tube Daily  . free water  250 mL Per Tube Q4H  . hydrALAZINE  25 mg Per Tube Q8H  . insulin aspart  0-15 Units Subcutaneous Q4H  . levETIRAcetam  500 mg Per Tube BID  . mouth rinse  15 mL Mouth Rinse q12n4p  . metoprolol tartrate  100 mg Per Tube BID  . multivitamin  15 mL Per Tube Daily  . pantoprazole sodium  40 mg Per Tube BID  . QUEtiapine  50 mg Per Tube QHS  . sodium chloride flush  10-40 mL Intracatheter Q12H  . thiamine  100 mg Per Tube Daily   Continuous Infusions: . feeding supplement (OSMOLITE 1.5 CAL) 1,000 mL (11/17/19 0326)     LOS: 69 days        Hosie Poisson, MD [facility] 11/17/2019, 2:46 PM

## 2019-11-17 NOTE — Progress Notes (Signed)
Physical Therapy Treatment Patient Details Name: Joshua [name] MRN: [id number] DOB: 08-28-1981 Today's Date: 11/17/2019    History of Present Illness Pt is a 38 y.o. 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 S/p L frontoparietal craniectomy for evacuation of L basal ganglia hematoma 9/12. ETT 9/12-9/18, 9/18-9/23. PEG tube placed 9/28 secondary to dysphagia. PMH includes alcohol abuse, seizures, HTN.    PT Comments    Pt more resistant to movement and more fatigued this session.  He responded to noxious stimuli and experienced discomfort with stretching into cervical extension.  Pt continues to benefit from snf placement.  He is required increased assistance today.     Follow Up Recommendations  SNF     Equipment Recommendations  Wheelchair (measurements PT);Wheelchair cushion (measurements PT);Hospital bed;3in1 (PT)    Recommendations for Other Services       Precautions / Restrictions Precautions Precautions: Fall Precaution Comments: L crani, PEG, L hand soft mitt, helmet Required Braces or Orthoses: Splint/Cast Splint/Cast: resting hand splint and elbow splint for L UE due to contracture Restrictions Weight Bearing Restrictions: No    Mobility  Bed Mobility Overal bed mobility: Needs Assistance Bed Mobility: Supine to Sit     Supine to sit: Max assist;+2 for physical assistance;HOB elevated;Total assist     General bed mobility comments: Pt required MAX- total A +2 for all aspect of bed mobility. Heavily resistant to movment noted to be pushing back and holding onto bed rail with LUE to resist movement.  Pt also attempting to move LEs back to bed once sitting upright.  Transfers Overall transfer level: Needs assistance Equipment used: Ambulation equipment used(sara +) Transfers: Sit to/from [facility] Sit to Stand: +2 physical assistance;+2 safety/equipment;Total assist Stand pivot transfers: Total  assist;+2 physical assistance       General transfer comment: Pt performed sit to stand from bed in sara +.  He required total assistance to lift and position feet on foot plate.  Pt required placement of BUEs on platform. Pt unable to track or attend to stimuli once standing. Pivot pt to TIS via sara + with total A.  Ambulation/Gait                 Stairs             Wheelchair Mobility    Modified Rankin (Stroke Patients Only)       Balance Overall balance assessment: Needs assistance Sitting-balance support: Feet supported;Single extremity supported Sitting balance-Leahy Scale: Poor Sitting balance - Comments: pt initially required max A progressing to close min guard +2 with LUE supported on bed rail for support Postural control: Right lateral lean   Standing balance-Leahy Scale: Zero Standing balance comment: pt required assist on R side to maintain upright posterior. Pt heavily leaning to R despite facilitating midline posture via sara +                            Cognition Arousal/Alertness: Awake/alert Behavior During Therapy: Flat affect Overall Cognitive Status: Impaired/Different from baseline Area of Impairment: Attention;Following commands;Problem solving;Awareness                   Current Attention Level: Focused Memory: Decreased short-term memory Following Commands: Follows one step commands inconsistently;Follows multi-step commands inconsistently Safety/Judgement: Decreased awareness of safety Awareness: Intellectual Problem Solving: Slow processing;Decreased initiation;Difficulty sequencing;Requires verbal cues;Requires tactile cues General Comments: pt not following commands despite  MAX mulitmodal cues. resistant to therapy session overall      Exercises Other Exercises Other Exercises: Cervical extension stretching 2x 15 seconds.    General Comments General comments (skin integrity, edema, etc.): girlfriend present  throuhgout session. Pushed pt around unit in TIS after session      Pertinent Vitals/Pain Pain Assessment: Faces Faces Pain Scale: No hurt Pain Location: neck with gentle ROM Pain Descriptors / Indicators: Guarding;Grimacing Pain Intervention(s): Monitored during session    Home Living                      Prior Function            PT Goals (current goals can now be found in the care plan section) Acute Rehab PT Goals Patient Stated Goal: none stated PT Goal Formulation: Patient unable to participate in goal setting Potential to Achieve Goals: Fair Progress towards PT goals: Progressing toward goals    Frequency    Min 2X/week      PT Plan Current plan remains appropriate    Co-evaluation PT/OT/SLP Co-Evaluation/Treatment: Yes Reason for Co-Treatment: Complexity of the patient's impairments (multi-system involvement) PT goals addressed during session: Mobility/safety with mobility;Strengthening/ROM OT goals addressed during session: ADL's and self-care;Strengthening/ROM;Proper use of Adaptive equipment and DME      AM-PAC PT "6 Clicks" Mobility   Outcome Measure  Help needed turning from your back to your side while in a flat bed without using bedrails?: Total Help needed moving from lying on your back to sitting on the side of a flat bed without using bedrails?: Total Help needed moving to and from a bed to a chair (including a wheelchair)?: Total Help needed standing up from a chair using your arms (e.g., wheelchair or bedside chair)?: Total Help needed to walk in hospital room?: Total Help needed climbing 3-5 steps with a railing? : Total 6 Click Score: 6    End of Session Equipment Utilized During Treatment: Gait belt Activity Tolerance: Patient limited by fatigue Patient left: with call bell/phone within reach;with bed alarm set;in bed;Other (comment);with family/visitor present Nurse Communication: Mobility status PT Visit Diagnosis: Muscle  weakness (generalized) (M62.81);Difficulty in walking, not elsewhere classified (R26.2);Hemiplegia and hemiparesis Hemiplegia - Right/Left: Right Hemiplegia - dominant/non-dominant: Dominant Hemiplegia - caused by: Nontraumatic intracerebral hemorrhage     Time: 1331-1410 PT Time Calculation (min) (ACUTE ONLY): 39 min  Charges:  $Therapeutic Activity: 23-37 mins                     [name] , PTA Acute Rehabilitation Services Pager [phone] Office [phone]     Samil Mecham [name] 11/17/2019, 3:52 PM

## 2019-11-17 NOTE — Progress Notes (Signed)
11/17/2019 PTA alerted this supervising PT that Mr. Summit goals were due.  I am familiar with this pt from multiple previous sessions of my own and have been following his progress.  I read his latest treatment notes and updated his goals today.    Thanks,  Verdene Lennert, PT, DPT  Acute Rehabilitation [phone] pager [phone] office  @ [facility]: [phone]

## 2019-11-17 NOTE — Progress Notes (Signed)
Occupational Therapy Treatment Patient Details Name: Joshua [name] MRN: [id number] DOB: 1981-07-23 Today's Date: 11/17/2019    History of present illness Pt is a 38 y.o. 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 S/p L frontoparietal craniectomy for evacuation of L basal ganglia hematoma 9/12. ETT 9/12-9/18, 9/18-9/23. PEG tube placed 9/28 secondary to dysphagia. PMH includes alcohol abuse, seizures, HTN.   OT comments  Overall, pt required MAX- total A for all mobility and ADLs this session. Pt required total A for bed mobility and to postion pt in sara plus. Pt resistant to movement pushing back during bed mobility. Transitioned pt to TIS w/c via sara plus.  Pt required total A for UB ADL with hand over hand assist. Pt unable to attend or track stimuli once in standing this session. DC plan remains appropriate. Will continue to follow per POC.   Follow Up Recommendations  SNF;Supervision/Assistance - 24 hour    Equipment Recommendations  None recommended by OT    Recommendations for Other Services      Precautions / Restrictions Precautions Precautions: Fall Precaution Comments: L crani, PEG, L hand soft mitt, helmet Required Braces or Orthoses: Splint/Cast Splint/Cast: resting hand splint and elbow splint for L UE due to contracture Restrictions Weight Bearing Restrictions: No       Mobility Bed Mobility Overal bed mobility: Needs Assistance Bed Mobility: Supine to Sit     Supine to sit: Max assist;+2 for physical assistance;HOB elevated;Total assist     General bed mobility comments: Pt required MAX- total A +2 for all aspect of bed mobility. Heavily resistant to movment noted to be pushing back and holding onto bed rail with LUE to resist movement  Transfers Overall transfer level: Needs assistance Equipment used: Ambulation equipment used(sara +) Transfers: Sit to/from [facility] Sit to Stand: +2  physical assistance;+2 safety/equipment;Total assist Stand pivot transfers: Total assist;+2 physical assistance       General transfer comment: Pt performed sit to stand from bed in sara +.  He required total assistance to lift and position feet on foot plate.  Pt required placement of BUEs on platform. Pt unable to track or attend to stimuli once standing. Pivot pt to TIS via sara + with total A.    Balance Overall balance assessment: Needs assistance Sitting-balance support: Feet supported;Single extremity supported Sitting balance-Leahy Scale: Poor Sitting balance - Comments: pt initially required max A progressing to close min guard +2 with LUE supported on bed rail for support Postural control: Right lateral lean   Standing balance-Leahy Scale: Poor Standing balance comment: pt required assist on R side to maintain upright posterior. Pt heavily leaning to R despite facilitating midline posture via sara +                           ADL either performed or assessed with clinical judgement   ADL Overall ADL's : Needs assistance/impaired Eating/Feeding: NPO   Grooming: Wash/dry face;Total assistance;Sitting Grooming Details (indicate cue type and reason): hand over hand assist                  Toilet Transfer: Maximal assistance;+2 for physical assistance;Stand-pivot;Total assistance;+2 for safety/equipment Toilet Transfer Details (indicate cue type and reason): simulated to TIS via sara +, pt resistive to movment this session requriring MAX- total A for all mobility         Functional mobility during ADLs: Maximal assistance;+2 for  physical assistance;+2 for safety/equipment;Total assistance General ADL Comments: MAX- total A for all aspects of mobiliy and ADL participation. Transferred pt to TIS via sara +, pt required total A to wash face with hand over hand assist     Vision       Perception     Praxis      Cognition Arousal/Alertness:  Awake/alert Behavior During Therapy: Flat affect Overall Cognitive Status: Impaired/Different from baseline Area of Impairment: Attention;Following commands;Problem solving;Awareness                   Current Attention Level: Focused   Following Commands: Follows one step commands inconsistently;Follows multi-step commands inconsistently   Awareness: Intellectual Problem Solving: Slow processing;Decreased initiation;Difficulty sequencing;Requires verbal cues;Requires tactile cues General Comments: pt not following commands despite MAX mulitmodal cues. resistant to therapy session overall        Exercises     Shoulder Instructions       General Comments girlfriend present throuhgout session. Pushed pt around unit in TIS after session    Pertinent Vitals/ Pain       Pain Assessment: Faces Faces Pain Scale: No hurt  Home Living                                          Prior Functioning/Environment              Frequency  Min 2X/week        Progress Toward Goals  OT Goals(current goals can now be found in the care plan section)  Progress towards OT goals: Progressing toward goals  Acute Rehab OT Goals Patient Stated Goal: none stated OT Goal Formulation: Patient unable to participate in goal setting Time For Goal Achievement: 11/28/19 Potential to Achieve Goals: Good  Plan Discharge plan remains appropriate    Co-evaluation    PT/OT/SLP Co-Evaluation/Treatment: Yes Reason for Co-Treatment: Complexity of the patient's impairments (multi-system involvement);Necessary to address cognition/behavior during functional activity;For patient/therapist safety;To address functional/ADL transfers          AM-PAC OT "6 Clicks" Daily Activity     Outcome Measure   Help from another person eating meals?: Total Help from another person taking care of personal grooming?: Total Help from another person toileting, which includes using toliet,  bedpan, or urinal?: Total Help from another person bathing (including washing, rinsing, drying)?: Total Help from another person to put on and taking off regular upper body clothing?: Total Help from another person to put on and taking off regular lower body clothing?: Total 6 Click Score: 6    End of Session Equipment Utilized During Treatment: Gait belt;Other (comment)(sara +)  OT Visit Diagnosis: Other abnormalities of gait and mobility (R26.89);Hemiplegia and hemiparesis;Cognitive communication deficit (R41.841) Symptoms and signs involving cognitive functions: Other Nontraumatic ICH Hemiplegia - Right/Left: Right Hemiplegia - caused by: Other Nontraumatic intracranial hemorrhage   Activity Tolerance Patient limited by lethargy;Patient limited by fatigue   Patient Left Other (comment)(TIS w/c)   Nurse Communication Mobility status;Need for lift equipment        Time: [id number] OT Time Calculation (min): 39 min  Charges: OT General Charges $OT Visit: 1 Visit OT Treatments $Therapeutic Activity: 8-22 mins  Lanier Clam., COTA/L Acute Rehabilitation Services [phone]    Ihor Gully 11/17/2019, 2:34 PM

## 2019-11-18 LAB — GLUCOSE, CAPILLARY
Glucose-Capillary: 100 mg/dL — ABNORMAL HIGH (ref 70–99)
Glucose-Capillary: 121 mg/dL — ABNORMAL HIGH (ref 70–99)
Glucose-Capillary: 121 mg/dL — ABNORMAL HIGH (ref 70–99)
Glucose-Capillary: 127 mg/dL — ABNORMAL HIGH (ref 70–99)
Glucose-Capillary: 120 mg/dL — ABNORMAL HIGH (ref 70–99)

## 2019-11-18 NOTE — Progress Notes (Signed)
PROGRESS NOTE    Joshua [name]  [id number] DOB: 05-Jul-1981 DOA: 09/09/2019 PCP: Patient, [name]    Brief Narrative:  38 year old male admitted for left hemispheric intracranial hemorrhage s/p left craniotomy for decompression of ICH. S/p PEG tube on 9/28 and currently waiting for placement at rehab facility as he is not safe for discharge home. RN reports the PEG tube is leaking a little, will get surgery consult for further evaluation.   Assessment & Plan:   Active Problems:   ICH (intracerebral hemorrhage) (HCC)   Subdural hematoma (HCC)   Intracerebral hemorrhage (HCC)   Cytotoxic brain edema (HCC)   Hemorrhagic stroke (HCC)   Acute respiratory failure ([place])   Essential hypertension   Severe protein-calorie malnutrition (HCC)   CKD (chronic kidney disease), stage IV (HCC)   Seizure (HCC)   Left intracranial hemorrhage s/p decompressive craniectomy and hematoma evacuation and implantation of skull flap in the right abdomen by Dr. Vertell Limber in September with associated seizures. Currently he is seizure free.  Resume keppra 500 mg BID.    PT evaluation recommending SNF for placement as he is not safe for discharge home. Unfortunately patient is  Aphasic and non verbal.     Aspiration pneumonia Completed 7-day course of antibiotics. No new events.  S/p PEG tube for dysphagia. No coughing today.  willr equest for SLP evaluation today .     Essential hypertension Well controlled. Resume home meds.     Severe protein calorie malnutrition Tube feeds via PEG tube at the rate of 86ml/hr.  Further recommendations as per dietician.     h/o alcohol use  No signs of withdrawal.    Stage 4 CKD:  Creatinine at 1.4 and stable.    Mild anemia of chronic disease: Hemoglobin stable around 12.        DVT prophylaxis: Lovenox Code Status: Full code Family Communication: (None at bedside Disposition Plan: Pending placement, not safe for discharge home   Consultants:   Neurosurgery  Procedures: S/p decompressive craniectomy, hematoma evacuation.  Antimicrobials: None  Subjective: Non verbal, no new complaints.   Objective: Vitals:   11/18/19 0720 11/18/19 0748 11/18/19 1223 11/18/19 1334  BP: (!) 141/104 (!) 133/98 112/80 130/84  Pulse: 90 88 92 90  Resp: 18 18 18 18   Temp: 98.6 F (37 C)  99 F (37.2 C)   TempSrc: Oral  Oral   SpO2: 100% 100% 100%   Weight:      Height:        Intake/Output Summary (Last 24 hours) at 11/18/2019 1449 Last data filed at 11/18/2019 0900 Gross per 24 hour  Intake 1800 ml  Output 1810 ml  Net -10 ml   Filed Weights   11/09/19 0316 11/12/19 0532 11/17/19 0439  Weight: 63 kg 57.7 kg 57.9 kg    Examination:  General exam:  Alert and comfortable. Non communicative.  Respiratory system: air entry fair , no wheezing or rhonchi.  Cardiovascular system:  S1S2, RRR, no jVD.  Gastrointestinal system: abd is soft non tender non distended bowel sounds good. PEG tube in place on tube feeds at 44ml/hr of the osmolite.  Central nervous system: nonverbal. No new deficits.  Extremities:  No cyanosis. Or clubbing.  Skin: no lesions.  Psychiatry: calm.     Data Reviewed: I have personally reviewed following labs and imaging studies  CBC: Recent Labs  Lab 11/16/19 1029  WBC 6.4  HGB 12.2*  HCT 38.1*  MCV 85.6  PLT 305  Basic Metabolic Panel: Recent Labs  Lab 11/16/19 1029  NA 138  K 4.6  CL 98  CO2 28  GLUCOSE 109*  BUN 30*  CREATININE 1.41*  CALCIUM 9.8   GFR: Estimated Creatinine Clearance: 57.6 mL/min (A) (by C-G formula based on SCr of 1.41 mg/dL (H)). Liver Function Tests: No results for input(s): AST, ALT, ALKPHOS, BILITOT, PROT, ALBUMIN in the last 168 hours. No results for input(s): LIPASE, AMYLASE in the last 168 hours. No results for input(s): AMMONIA in the last 168 hours. Coagulation Profile: No results for input(s): INR, PROTIME in the last 168 hours. Cardiac  Enzymes: No results for input(s): CKTOTAL, CKMB, CKMBINDEX, TROPONINI in the last 168 hours. BNP (last 3 results) No results for input(s): PROBNP in the last 8760 hours. HbA1C: No results for input(s): HGBA1C in the last 72 hours. CBG: Recent Labs  Lab 11/17/19 2028 11/17/19 2304 11/18/19 0334 11/18/19 0723 11/18/19 1220  GLUCAP 101* 107* 100* 121* 121*   Lipid Profile: No results for input(s): CHOL, HDL, LDLCALC, TRIG, CHOLHDL, LDLDIRECT in the last 72 hours. Thyroid Function Tests: No results for input(s): TSH, T4TOTAL, FREET4, T3FREE, THYROIDAB in the last 72 hours. Anemia Panel: No results for input(s): VITAMINB12, FOLATE, FERRITIN, TIBC, IRON, RETICCTPCT in the last 72 hours. Sepsis Labs: No results for input(s): PROCALCITON, LATICACIDVEN in the last 168 hours.  No results found for this or any previous visit (from the past 240 hour(s)).       Radiology Studies: No results found.      Scheduled Meds: . alteplase  2 mg Intracatheter Once  . amLODipine  10 mg Per Tube Daily  . chlorhexidine  15 mL Mouth Rinse BID  . enoxaparin (LOVENOX) injection  40 mg Subcutaneous Daily  . folic acid  1 mg Per Tube Daily  . free water  250 mL Per Tube Q4H  . hydrALAZINE  25 mg Per Tube Q8H  . insulin aspart  0-15 Units Subcutaneous Q4H  . levETIRAcetam  500 mg Per Tube BID  . mouth rinse  15 mL Mouth Rinse q12n4p  . metoprolol tartrate  100 mg Per Tube BID  . multivitamin  15 mL Per Tube Daily  . pantoprazole sodium  40 mg Per Tube BID  . QUEtiapine  50 mg Per Tube QHS  . sodium chloride flush  10-40 mL Intracatheter Q12H  . thiamine  100 mg Per Tube Daily   Continuous Infusions: . feeding supplement (OSMOLITE 1.5 CAL) 1,000 mL (11/18/19 0021)     LOS: 70 days        Hosie Poisson, MD [facility] 11/18/2019, 2:49 PM

## 2019-11-18 NOTE — Plan of Care (Signed)
Patient progressing towards plan of care goals. 

## 2019-11-19 LAB — GLUCOSE, CAPILLARY
Glucose-Capillary: 105 mg/dL — ABNORMAL HIGH (ref 70–99)
Glucose-Capillary: 112 mg/dL — ABNORMAL HIGH (ref 70–99)
Glucose-Capillary: 112 mg/dL — ABNORMAL HIGH (ref 70–99)
Glucose-Capillary: 114 mg/dL — ABNORMAL HIGH (ref 70–99)
Glucose-Capillary: 117 mg/dL — ABNORMAL HIGH (ref 70–99)
Glucose-Capillary: 129 mg/dL — ABNORMAL HIGH (ref 70–99)
Glucose-Capillary: 98 mg/dL (ref 70–99)

## 2019-11-19 NOTE — Evaluation (Signed)
Clinical/Bedside Swallow Evaluation Patient Details  Name: Joshua [name] MRN: [id number] Date of Birth: [birth year]  Today's Date: 11/19/2019 Time: SLP Start Time (ACUTE ONLY): 1140 SLP Stop Time (ACUTE ONLY): 1200 SLP Time Calculation (min) (ACUTE ONLY): 20 min  Past Medical History: History reviewed. No pertinent past medical history. Past Surgical History:  Past Surgical History:  Procedure Laterality Date  . CRANIOTOMY Left 09/09/2019   Procedure: DECOMPRESSIVE CRANIECTOMY AND  HEMATOMA EVACUATION, IMPLANTATION OF SKULL FLAP IN RIGHT ABDOMEN;  Surgeon: Erline Levine, MD;  Location: [facility];  Service: Neurosurgery;  Laterality: Left;  . ESOPHAGOGASTRODUODENOSCOPY (EGD) WITH PROPOFOL N/A 09/28/2019   Procedure: ESOPHAGOGASTRODUODENOSCOPY (EGD) WITH PROPOFOL;  Surgeon: Georganna Skeans, MD;  Location: [facility] ENDOSCOPY;  Service: General;  Laterality: N/A;  . PEG PLACEMENT N/A 09/28/2019   Procedure: PERCUTANEOUS ENDOSCOPIC GASTROSTOMY (PEG) PLACEMENT;  Surgeon: Georganna Skeans, MD;  Location: [facility] ENDOSCOPY;  Service: General;  Laterality: N/A;   HPI:  Pt is a 38 year old male who presented with new seizure, and was found to have a large left BG intracranial hemorrhage s/p L frontal temporal craniectomy with evacuation 9/12. ETT 9/12-9/18; 9/18-9/23. PMH: HTN, alcoholism, noncompliance   Assessment / Plan / Recommendation Clinical Impression  Orders received for BSE after pt was previously DC'd from skilled ST services due to lack of participation. Pt was in bed, awake and alert upon arrival of SLP. No family present at this time. Oral care was attempted. Pt allowed cleaning of exterior surfaces of teeth only. Pt would not open his jaw to allow cleaning of inside of oral cavity, pushing my hand away or grabbing at suction swab and pushing it away. Pt refused to accept ice chips or ice cream, tightening lips closed and pushing my hand away. Pt continues to be nonvocal, and does not follow commands. SLP  will continue attempts to evaluate po readiness, but pt will be DC'd from [place] services again if he exhibits no change in willingness to participate in the next 1-2 sessions.    SLP Visit Diagnosis: Dysphagia, unspecified (R13.10)    Aspiration Risk  Severe aspiration risk    Diet Recommendation NPO;Alternative means - long-term   Medication Administration: Via alternative means    Other  Recommendations Oral Care Recommendations: Oral care QID;Staff/trained caregiver to provide oral care Other Recommendations: Have oral suction available   Follow up Recommendations 24 hour supervision/assistance;Skilled Nursing facility      Frequency and Duration min 1 x/week  1 week;2 weeks       Prognosis Prognosis for Safe Diet Advancement: Guarded Barriers to Reach Goals: Behavior      Swallow Study   General Date of Onset: 11/09/19 HPI: Pt is a 38 year old male who presented with new seizure, and was found to have a large left BG intracranial hemorrhage s/p L frontal temporal craniectomy with evacuation 9/12. ETT 9/12-9/18; 9/18-9/23. PMH: HTN, alcoholism, noncompliance Type of Study: Bedside Swallow Evaluation Previous Swallow Assessment: BSE 09/22/2019 Diet Prior to this Study: NPO Temperature Spikes Noted: No Respiratory Status: Room air History of Recent Intubation: No Behavior/Cognition: Alert;Doesn't follow directions;Uncooperative Oral Cavity Assessment: (unable to assess) Oral Care Completed by SLP: Yes(attempted) Self-Feeding Abilities: Total assist Patient Positioning: Postural control adequate for testing Baseline Vocal Quality: Not observed Volitional Cough: Cognitively unable to elicit Volitional Swallow: Unable to elicit    Oral/Motor/Sensory Function Overall Oral Motor/Sensory Function: (unable to assess)   Ice Chips Ice chips: (pt unwilling to accept ice chips)   Thin Liquid Thin Liquid: Not tested  Nectar Thick Nectar Thick Liquid: (pt would not accept ice  cream)   Honey Thick Honey Thick Liquid: Not tested   Puree Puree: Not tested   Solid     Solid: Not tested     Enriqueta Shutter, [facility], [place] Pathologist Office: [phone] Pager: [phone]  [name] 11/19/2019,12:11 PM

## 2019-11-19 NOTE — Progress Notes (Signed)
PROGRESS NOTE    Joshua [name]  [id number] DOB: 1981-05-15 DOA: 09/09/2019 PCP: Patient, [name]    Brief Narrative:  38 year old male admitted for left hemispheric intracranial hemorrhage s/p left craniotomy for decompression of ICH. S/p PEG tube on 9/28 and currently waiting for placement at rehab facility as he is not safe for discharge home.  slp evaluation requested, but was unsuccessful.   Assessment & Plan:   Active Problems:   ICH (intracerebral hemorrhage) (HCC)   Subdural hematoma (HCC)   Intracerebral hemorrhage (HCC)   Cytotoxic brain edema (HCC)   Hemorrhagic stroke (HCC)   Acute respiratory failure ([place])   Essential hypertension   Severe protein-calorie malnutrition (HCC)   CKD (chronic kidney disease), stage IV (HCC)   Seizure (HCC)   Left intracranial hemorrhage s/p decompressive craniectomy and hematoma evacuation and implantation of skull flap in the right abdomen by Dr. Vertell Limber in September with associated seizures.   PT evaluation recommending SNF for placement as he is not safe for discharge home. Unfortunately patient is  Aphasic and non verbal. Currently seizure free and continue with keppra 500 mg bid.      Aspiration pneumonia Completed 7-day course of antibiotics. No new events.  S/p PEG tube for dysphagia. No coughing today.  SLP requested and as per SLP " Pt allowed cleaning of exterior surfaces of teeth only. Pt would not open his jaw to allow cleaning of inside of oral cavity, pushing my hand away or grabbing at suction swab and pushing it away. Pt refused to accept ice chips or ice cream, tightening lips closed and pushing my hand away. Pt continues to be nonvocal, and does not follow commands. SLP will continue attempts to evaluate po readiness, but pt will be DC'd from [place] services again if he exhibits no change in willingness to participate in the next 1-2 sessions. "    Essential hypertension Well controlled with metoprolol and  norvasc.    Severe protein calorie malnutrition Tube feeds via PEG tube at the rate of 54ml/hr.  Further recommendations as per dietician.  SLP requested and as per SLP " Pt allowed cleaning of exterior surfaces of teeth only. Pt would not open his jaw to allow cleaning of inside of oral cavity, pushing my hand away or grabbing at suction swab and pushing it away. Pt refused to accept ice chips or ice cream, tightening lips closed and pushing my hand away. Pt continues to be nonvocal, and does not follow commands. SLP will continue attempts to evaluate po readiness, but pt will be DC'd from [place] services again if he exhibits no change in willingness to participate in the next 1-2 sessions. "    h/o alcohol use  No signs of withdrawal.    Stage 4 CKD:  Creatinine at 1.4 and stable.    Mild anemia of chronic disease: Hemoglobin stable around 12.        DVT prophylaxis: Lovenox Code Status: Full code Family Communication: (None at bedside Disposition Plan: Pending placement, not safe for discharge home  Consultants:   Neurosurgery  Procedures: S/p decompressive craniectomy, hematoma evacuation.  Antimicrobials: None  Subjective: ALERT , non verbal, not in distress.   Objective: Vitals:   11/19/19 0752 11/19/19 1226 11/19/19 1618 11/19/19 1651  BP: 111/75 94/71 (!) 134/92 (!) 140/91  Pulse: 88 90 98 (!) 102  Resp: 15 17 18 15   Temp: (!) 97.5 F (36.4 C) 97.9 F (36.6 C) 98 F (36.7 C) 97.7 F (36.5 C)  TempSrc: Axillary Axillary Axillary Axillary  SpO2: 99% 100%  100%  Weight:      Height:        Intake/Output Summary (Last 24 hours) at 11/19/2019 1656 Last data filed at 11/19/2019 1617 Gross per 24 hour  Intake 1666.67 ml  Output 2600 ml  Net -933.33 ml   Filed Weights   11/09/19 0316 11/12/19 0532 11/17/19 0439  Weight: 63 kg 57.7 kg 57.9 kg    Examination:  General exam: Alert and comfortable, nonverbal Respiratory system: Clear to auscultation  bilaterally, no wheezing or rhonchi Cardiovascular system: 1 S2 heard, regular rate and rhythm, no JVD Gastrointestinal system: Abdomen is soft, nontender, nondistended, bowel sounds normal, s/p PEG tube in place Central nervous system: Alert, nonverbal, no new deficits Extremities: No cyanosis or clubbing Skin: No ulcers evident Psychiatry: Appears calm    Data Reviewed: I have personally reviewed following labs and imaging studies  CBC: Recent Labs  Lab 11/16/19 1029  WBC 6.4  HGB 12.2*  HCT 38.1*  MCV 85.6  PLT 123456   Basic Metabolic Panel: Recent Labs  Lab 11/16/19 1029  NA 138  K 4.6  CL 98  CO2 28  GLUCOSE 109*  BUN 30*  CREATININE 1.41*  CALCIUM 9.8   GFR: Estimated Creatinine Clearance: 57.6 mL/min (A) (by C-G formula based on SCr of 1.41 mg/dL (H)). Liver Function Tests: No results for input(s): AST, ALT, ALKPHOS, BILITOT, PROT, ALBUMIN in the last 168 hours. No results for input(s): LIPASE, AMYLASE in the last 168 hours. No results for input(s): AMMONIA in the last 168 hours. Coagulation Profile: No results for input(s): INR, PROTIME in the last 168 hours. Cardiac Enzymes: No results for input(s): CKTOTAL, CKMB, CKMBINDEX, TROPONINI in the last 168 hours. BNP (last 3 results) No results for input(s): PROBNP in the last 8760 hours. HbA1C: No results for input(s): HGBA1C in the last 72 hours. CBG: Recent Labs  Lab 11/18/19 1953 11/19/19 0055 11/19/19 0402 11/19/19 0746 11/19/19 1226  GLUCAP 120* 129* 112* 112* 114*   Lipid Profile: No results for input(s): CHOL, HDL, LDLCALC, TRIG, CHOLHDL, LDLDIRECT in the last 72 hours. Thyroid Function Tests: No results for input(s): TSH, T4TOTAL, FREET4, T3FREE, THYROIDAB in the last 72 hours. Anemia Panel: No results for input(s): VITAMINB12, FOLATE, FERRITIN, TIBC, IRON, RETICCTPCT in the last 72 hours. Sepsis Labs: No results for input(s): PROCALCITON, LATICACIDVEN in the last 168 hours.  No results  found for this or any previous visit (from the past 240 hour(s)).       Radiology Studies: No results found.      Scheduled Meds: . alteplase  2 mg Intracatheter Once  . amLODipine  10 mg Per Tube Daily  . chlorhexidine  15 mL Mouth Rinse BID  . enoxaparin (LOVENOX) injection  40 mg Subcutaneous Daily  . folic acid  1 mg Per Tube Daily  . free water  250 mL Per Tube Q4H  . hydrALAZINE  25 mg Per Tube Q8H  . insulin aspart  0-15 Units Subcutaneous Q4H  . levETIRAcetam  500 mg Per Tube BID  . mouth rinse  15 mL Mouth Rinse q12n4p  . metoprolol tartrate  100 mg Per Tube BID  . multivitamin  15 mL Per Tube Daily  . pantoprazole sodium  40 mg Per Tube BID  . QUEtiapine  50 mg Per Tube QHS  . sodium chloride flush  10-40 mL Intracatheter Q12H  . thiamine  100 mg Per Tube Daily  Continuous Infusions: . feeding supplement (OSMOLITE 1.5 CAL) 1,000 mL (11/18/19 1722)     LOS: 71 days        Hosie Poisson, MD [facility] 11/19/2019, 4:56 PM

## 2019-11-19 NOTE — Plan of Care (Signed)
Patient progressing towards plan of care goals. 

## 2019-11-20 LAB — GLUCOSE, CAPILLARY
Glucose-Capillary: 110 mg/dL — ABNORMAL HIGH (ref 70–99)
Glucose-Capillary: 127 mg/dL — ABNORMAL HIGH (ref 70–99)
Glucose-Capillary: 105 mg/dL — ABNORMAL HIGH (ref 70–99)
Glucose-Capillary: 111 mg/dL — ABNORMAL HIGH (ref 70–99)
Glucose-Capillary: 120 mg/dL — ABNORMAL HIGH (ref 70–99)
Glucose-Capillary: 119 mg/dL — ABNORMAL HIGH (ref 70–99)

## 2019-11-20 NOTE — Plan of Care (Signed)
Patient is progressing towards plan of care goals. 

## 2019-11-20 NOTE — Progress Notes (Signed)
PROGRESS NOTE    Joshua [name]  [id number] DOB: May 10, 1981 DOA: 09/09/2019 PCP: Patient, [name]    Brief Narrative:  38 year old male admitted for left hemispheric intracranial hemorrhage s/p left craniotomy for decompression of ICH. S/p PEG tube on 9/28 and currently waiting for placement at rehab facility as he is not safe for discharge home.  slp evaluation requested, but was unsuccessful. Pt is responding to simple commands. Extending his hand today on verbal cues.   Assessment & Plan:   Active Problems:   ICH (intracerebral hemorrhage) (HCC)   Subdural hematoma (HCC)   Intracerebral hemorrhage (HCC)   Cytotoxic brain edema (HCC)   Hemorrhagic stroke (HCC)   Acute respiratory failure ([place])   Essential hypertension   Severe protein-calorie malnutrition (HCC)   CKD (chronic kidney disease), stage IV (HCC)   Seizure (HCC)   Left intracranial hemorrhage s/p decompressive craniectomy and hematoma evacuation and implantation of skull flap in the right abdomen by Dr. Vertell Limber in September with associated seizures.   PT evaluation recommending SNF for placement as he is not safe for discharge home. Unfortunately patient is  Aphasic and non verbal. Currently seizure free and continue with keppra 500 mg bid.  No changes in meds.      Aspiration pneumonia Completed 7-day course of antibiotics.  S/p PEG tube for dysphagia.  SLP requested and as per SLP " Pt allowed cleaning of exterior surfaces of teeth only. Pt would not open his jaw to allow cleaning of inside of oral cavity, pushing my hand away or grabbing at suction swab and pushing it away. Pt refused to accept ice chips or ice cream, tightening lips closed and pushing my hand away. Pt continues to be nonvocal, and does not follow commands. SLP will continue attempts to evaluate po readiness, but pt will be DC'd from [place] services again if he exhibits no change in willingness to participate in the next 1-2 sessions. " No  coughing today.     Essential hypertension Well controlled.     Severe protein calorie malnutrition Tube feeds via PEG tube at the rate of 79ml/hr.  Further recommendations as per dietician.  SLP requested and as per SLP " Pt allowed cleaning of exterior surfaces of teeth only. Pt would not open his jaw to allow cleaning of inside of oral cavity, pushing my hand away or grabbing at suction swab and pushing it away. Pt refused to accept ice chips or ice cream, tightening lips closed and pushing my hand away. Pt continues to be nonvocal, and does not follow commands. SLP will continue attempts to evaluate po readiness, but pt will be DC'd from [place] services again if he exhibits no change in willingness to participate in the next 1-2 sessions. "     h/o alcohol use  No signs of withdrawal.    Stage 4 CKD:  Creatinine at baseline.    Mild anemia of chronic disease: Hemoglobin stable around 12.        DVT prophylaxis: Lovenox Code Status: Full code Family Communication: (None at bedside Disposition Plan: Pending placement, not safe for discharge home  Consultants:   Neurosurgery  Procedures: S/p decompressive craniectomy, hematoma evacuation.  Antimicrobials: None  Subjective: Alert , non verbal, no distress.   Objective: Vitals:   11/20/19 0741 11/20/19 1200 11/20/19 1455 11/20/19 1543  BP: (!) 139/101 (!) 132/93 (!) 136/97 (!) 140/98  Pulse: 97 94 99 (!) 102  Resp: 18 18 18 20   Temp: 97.8 F (36.6 C)  98.7 F (37.1 C)  TempSrc: Oral   Oral  SpO2: 100% 100%  97%  Weight:      Height:        Intake/Output Summary (Last 24 hours) at 11/20/2019 1704 Last data filed at 11/20/2019 1544 Gross per 24 hour  Intake 2234.5 ml  Output 1330 ml  Net 904.5 ml   Filed Weights   11/09/19 0316 11/12/19 0532 11/17/19 0439  Weight: 63 kg 57.7 kg 57.9 kg    Examination:  General exam: alert and comfortable.  Respiratory system: air entry fair, no wheezing or rhonchi.   Cardiovascular system: S1S2, RRR, no JVD.  Gastrointestinal system:  abd is soft, NT ND BS+ Central nervous system: alert and non verbal.  Extremities: no cyanosis or clubbing.  Skin: No ulcers evident Psychiatry: calm.     Data Reviewed: I have personally reviewed following labs and imaging studies  CBC: Recent Labs  Lab 11/16/19 1029  WBC 6.4  HGB 12.2*  HCT 38.1*  MCV 85.6  PLT 123456   Basic Metabolic Panel: Recent Labs  Lab 11/16/19 1029  NA 138  K 4.6  CL 98  CO2 28  GLUCOSE 109*  BUN 30*  CREATININE 1.41*  CALCIUM 9.8   GFR: Estimated Creatinine Clearance: 57.6 mL/min (A) (by C-G formula based on SCr of 1.41 mg/dL (H)). Liver Function Tests: No results for input(s): AST, ALT, ALKPHOS, BILITOT, PROT, ALBUMIN in the last 168 hours. No results for input(s): LIPASE, AMYLASE in the last 168 hours. No results for input(s): AMMONIA in the last 168 hours. Coagulation Profile: No results for input(s): INR, PROTIME in the last 168 hours. Cardiac Enzymes: No results for input(s): CKTOTAL, CKMB, CKMBINDEX, TROPONINI in the last 168 hours. BNP (last 3 results) No results for input(s): PROBNP in the last 8760 hours. HbA1C: No results for input(s): HGBA1C in the last 72 hours. CBG: Recent Labs  Lab 11/19/19 2344 11/20/19 0443 11/20/19 0731 11/20/19 1142 11/20/19 1640  GLUCAP 117* 110* 127* 105* 111*   Lipid Profile: No results for input(s): CHOL, HDL, LDLCALC, TRIG, CHOLHDL, LDLDIRECT in the last 72 hours. Thyroid Function Tests: No results for input(s): TSH, T4TOTAL, FREET4, T3FREE, THYROIDAB in the last 72 hours. Anemia Panel: No results for input(s): VITAMINB12, FOLATE, FERRITIN, TIBC, IRON, RETICCTPCT in the last 72 hours. Sepsis Labs: No results for input(s): PROCALCITON, LATICACIDVEN in the last 168 hours.  No results found for this or any previous visit (from the past 240 hour(s)).       Radiology Studies: No results found.      Scheduled  Meds: . alteplase  2 mg Intracatheter Once  . amLODipine  10 mg Per Tube Daily  . chlorhexidine  15 mL Mouth Rinse BID  . enoxaparin (LOVENOX) injection  40 mg Subcutaneous Daily  . folic acid  1 mg Per Tube Daily  . free water  250 mL Per Tube Q4H  . hydrALAZINE  25 mg Per Tube Q8H  . insulin aspart  0-15 Units Subcutaneous Q4H  . levETIRAcetam  500 mg Per Tube BID  . mouth rinse  15 mL Mouth Rinse q12n4p  . metoprolol tartrate  100 mg Per Tube BID  . multivitamin  15 mL Per Tube Daily  . pantoprazole sodium  40 mg Per Tube BID  . QUEtiapine  50 mg Per Tube QHS  . sodium chloride flush  10-40 mL Intracatheter Q12H  . thiamine  100 mg Per Tube Daily   Continuous Infusions: . feeding  supplement (OSMOLITE 1.5 CAL) 1,000 mL (11/18/19 1722)     LOS: 72 days        Hosie Poisson, MD [facility] 11/20/2019, 5:04 PM

## 2019-11-20 NOTE — Progress Notes (Addendum)
Nutrition Follow-up  DOCUMENTATION CODES:   Severe malnutrition in context of acute illness/injury  INTERVENTION:  Continue Osmolite 1.5 @ 70 ml/hr with 250 ml free water every 4 hours via PEG.  NUTRITION DIAGNOSIS:   Severe Malnutrition related to acute illness(intracerebral hemorrage) as evidenced by severe fat depletion, severe muscle depletion  GOAL:   Patient will meet greater than or equal to 90% of their needs- met with TF regimen  MONITOR:   Diet advancement, Labs, Weight trends, TF tolerance, I & O's, Skin  REASON FOR ASSESSMENT:  Enteral/tube feeding initiation and management  ASSESSMENT:   38 yo male admitted with large left intracranial hemorrhage; S/P craniotomy. PMH includes HTN, alcoholism, noncompliance.  9/12left frontal temporal craniectomy and evacuation hematoma.  9/23 Extubated.  10/1 PEG placed 11/4- SLP signed off 11/22- SLP re-consulted, Pt refusing participation in oral care and PO intake.  11/23- Nutrition Focused Physical Exam performed, severe subcutaneous fat loss and severe muscle wasting identified.  Pt continues on NPO status and has been tolerating his tube feedings well. He is receiving Osmolite 1.5 @ 70 ml/hr with 250 ml free water flush every 4 hours. This regimen is providing 2520 kcal, 105 grams protein, and 2780 ml free water. This regimen meets 100% of his estimated calorie and protein needs. Pt is severely malnourished, but wt. Stable. RD to continue with current orders and monitor for continued tolerance. Awaiting SNF placement. Labs and medications reviewed.   Diet Order:   Diet Order            Diet NPO time specified  Diet effective midnight              EDUCATION NEEDS:   Not appropriate for education at this time  Skin:  Skin Assessment: Reviewed RN Assessment  Last BM:  11/22  Height:   Ht Readings from Last 1 Encounters:  09/09/19 _0  (1.753 m)    Weight:   Wt Readings from Last 1 Encounters:   11/17/19 57.9 kg    Ideal Body Weight:  72.7 kg  BMI:  Body mass index is 18.85 kg/m.  Estimated Nutritional Needs:   Kcal:  2100-2400  Protein:  105-125 grams  Fluid:  >/= 2 L/day  Meda Klinefelter, Dietetic Intern

## 2019-11-20 NOTE — Progress Notes (Signed)
  Speech Language Pathology Treatment: Dysphagia  Patient Details Name: Joshua [name] MRN: [id number] DOB: November 18, 1981 Today's Date: 11/20/2019 Time: 1500-1510 SLP Time Calculation (min) (ACUTE ONLY): 10 min  Assessment / Plan / Recommendation Clinical Impression  SLP follow up after BSE completed 11/19/2019. Pt awake, no family present. RN in room initially, and verbally encouraged pt to participate with me. I explained what I was doing and why, attempted oral care, which pt declined to allow. Pt did not open lips to allow cleaning inside oral cavity. SLP gave pt the suction swab and encouraged him to perform oral care himself. Pt would not cooperate with this either. SLP attempted to provide ice cream, which family has indicated in the past that he likes. Pt turned his face away and pushed my hand away, declining acceptance of the bolus. RN updated regarding pt participation today.    HPI HPI: Pt is a 38 year old male who presented with new seizure, and was found to have a large left BG intracranial hemorrhage s/p L frontal temporal craniectomy with evacuation 9/12. ETT 9/12-9/18; 9/18-9/23. PMH: HTN, alcoholism, noncompliance      SLP Plan  Continue with current plan of care       Recommendations  Diet recommendations: NPO Medication Administration: Via alternative means                Oral Care Recommendations: Oral care QID;Staff/trained caregiver to provide oral care Follow up Recommendations: 24 hour supervision/assistance;Skilled Nursing facility SLP Visit Diagnosis: Dysphagia, unspecified (R13.10) Plan: Continue with current plan of care       [place], [facility] [place] Speech Language Pathologist Office: [phone] Pager: [phone]  [name] 11/20/2019, 3:15 PM

## 2019-11-21 LAB — GLUCOSE, CAPILLARY
Glucose-Capillary: 104 mg/dL — ABNORMAL HIGH (ref 70–99)
Glucose-Capillary: 105 mg/dL — ABNORMAL HIGH (ref 70–99)
Glucose-Capillary: 110 mg/dL — ABNORMAL HIGH (ref 70–99)
Glucose-Capillary: 118 mg/dL — ABNORMAL HIGH (ref 70–99)
Glucose-Capillary: 119 mg/dL — ABNORMAL HIGH (ref 70–99)

## 2019-11-21 NOTE — Progress Notes (Signed)
Physical Therapy Treatment Patient Details Name: Joshua [name] MRN: [id number] DOB: August 02, 1981 Today's Date: 11/21/2019    History of Present Illness Pt is a 38 y.o. 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 S/p L frontoparietal craniectomy for evacuation of L basal ganglia hematoma 9/12. ETT 9/12-9/18, 9/18-9/23. PEG tube placed 9/28 secondary to dysphagia. PMH includes alcohol abuse, seizures, HTN.    PT Comments    Pt performed transfer to edge of bed into standing and from sara+ to tilt in space recliner chair.  Pt is resistive to movement this am and required max VCs as well as max +2 assistance.  He is demonstrating pushing and lateral lean to the R.  He is tracking at beginning of session.  Pt continues to benefit from snf placement at d/c to continue to allow for functional gains.  Supportive g/f at bedside today.    Follow Up Recommendations  SNF     Equipment Recommendations  Wheelchair (measurements PT);Wheelchair cushion (measurements PT);Hospital bed;3in1 (PT)    Recommendations for Other Services Rehab consult     Precautions / Restrictions Precautions Precautions: Fall Precaution Comments: L crani, PEG, L hand soft mitt, helmet Required Braces or Orthoses: Splint/Cast Splint/Cast: resting hand splint and elbow splint for L UE due to contracture Restrictions Weight Bearing Restrictions: No    Mobility  Bed Mobility Overal bed mobility: Needs Assistance Bed Mobility: Supine to Sit     Supine to sit: Max assist;+2 for physical assistance     General bed mobility comments: Pt resistive to movement and attempting to put LEs back to bed,  Pt required assistance for B LEs to edge of bed and elevation of trunk.  Strong posterior push/lean.  Transfers Overall transfer level: Needs assistance Equipment used: Ambulation equipment used(sara +) Transfers: Sit to/from [facility] Sit to Stand: +2  physical assistance;+2 safety/equipment;Max assist         General transfer comment: Pt required physical assistance to place feet on foot plate.  ONce feet in place used lift to elevate patient into standing.  He stood x 8 min with poor head and upper trunk control.  He lists to the R side.  Ambulation/Gait Ambulation/Gait assistance: (NT)               Stairs             Wheelchair Mobility    Modified Rankin (Stroke Patients Only)       Balance Overall balance assessment: Needs assistance   Sitting balance-Leahy Scale: Poor Sitting balance - Comments: strong posterior push.     Standing balance-Leahy Scale: Poor Standing balance comment: assistance to weight shift L.                            Cognition Arousal/Alertness: Awake/alert Behavior During Therapy: Flat affect Overall Cognitive Status: Impaired/Different from baseline Area of Impairment: Attention;Following commands;Problem solving;Awareness                   Current Attention Level: Focused Memory: Decreased short-term memory Following Commands: Follows one step commands inconsistently;Follows multi-step commands inconsistently Safety/Judgement: Decreased awareness of safety Awareness: Intellectual Problem Solving: Slow processing;Decreased initiation;Difficulty sequencing;Requires verbal cues;Requires tactile cues General Comments: pt not following commands despite MAX mulitmodal cues. resistant to therapy session overall      Exercises      General Comments        Pertinent Vitals/Pain  Pain Assessment: Faces Faces Pain Scale: Hurts little more Pain Location: general discomfort. Pain Descriptors / Indicators: Guarding;Grimacing Pain Intervention(s): Monitored during session;Repositioned    Home Living                      Prior Function            PT Goals (current goals can now be found in the care plan section) Acute Rehab PT Goals Patient  Stated Goal: none stated Potential to Achieve Goals: Fair Progress towards PT goals: Progressing toward goals    Frequency    Min 2X/week      PT Plan Current plan remains appropriate    Co-evaluation              AM-PAC PT "6 Clicks" Mobility   Outcome Measure  Help needed turning from your back to your side while in a flat bed without using bedrails?: Total Help needed moving from lying on your back to sitting on the side of a flat bed without using bedrails?: Total Help needed moving to and from a bed to a chair (including a wheelchair)?: Total Help needed standing up from a chair using your arms (e.g., wheelchair or bedside chair)?: Total Help needed to walk in hospital room?: Total Help needed climbing 3-5 steps with a railing? : Total 6 Click Score: 6    End of Session Equipment Utilized During Treatment: Gait belt Activity Tolerance: Patient limited by fatigue Patient left: with call bell/phone within reach;with bed alarm set;in bed;Other (comment);with family/visitor present Nurse Communication: Mobility status PT Visit Diagnosis: Muscle weakness (generalized) (M62.81);Difficulty in walking, not elsewhere classified (R26.2);Hemiplegia and hemiparesis Hemiplegia - Right/Left: Right Hemiplegia - dominant/non-dominant: Dominant Hemiplegia - caused by: Nontraumatic intracerebral hemorrhage     Time: [id number] PT Time Calculation (min) (ACUTE ONLY): 36 min  Charges:  $Therapeutic Activity: 23-37 mins                     [name] , PTA Acute Rehabilitation Services Pager [phone] Office [phone]     Joshua [name] 11/21/2019, 12:22 PM

## 2019-11-21 NOTE — Plan of Care (Signed)
Patient progressing towards plan of care goals. 

## 2019-11-21 NOTE — Progress Notes (Signed)
PROGRESS NOTE    Joshua [name]  [id number] DOB: 04-Oct-1981 DOA: 09/09/2019 PCP: Patient, [name]    Brief Narrative:  38 year old male admitted for left hemispheric intracranial hemorrhage s/p left craniotomy for decompression of ICH. S/p PEG tube on 9/28 and currently waiting for placement at rehab facility as he is not safe for discharge home.  SLP evaluation requested but was unsuccessful as patient kept pushing away the hand.  Patient appears calm without any distress. Clinical social worker on board to assist with placement at SNF as patient is not safe for discharge home.  Patient is slowly progressing with physical therapy at this time. Assessment & Plan:   Active Problems:   ICH (intracerebral hemorrhage) (HCC)   Subdural hematoma (HCC)   Intracerebral hemorrhage (HCC)   Cytotoxic brain edema (HCC)   Hemorrhagic stroke (HCC)   Acute respiratory failure ([place])   Essential hypertension   Severe protein-calorie malnutrition (HCC)   CKD (chronic kidney disease), stage IV (HCC)   Seizure (HCC)   Left intracranial hemorrhage s/p decompressive craniectomy and hematoma evacuation and implantation of skull flap in the right abdomen by Dr. Vertell Limber in September with associated seizures. PT evaluation recommending SNF for placement as he is not safe for discharge home. Unfortunately patient is aphasic and nonverbal. Seizure-free in the last 1 week, continue with Keppra 500 mg twice daily.     Aspiration pneumonia Completed 7-day course of antibiotics.  S/p PEG tube for dysphagia.  SLP requested and as per SLP " Pt allowed cleaning of exterior surfaces of teeth only. Pt would not open his jaw to allow cleaning of inside of oral cavity, pushing my hand away or grabbing at suction swab and pushing it away. Pt refused to accept ice chips or ice cream, tightening lips closed and pushing my hand away. Pt continues to be nonvocal, and does not follow commands. SLP will continue attempts  to evaluate po readiness, but pt will be DC'd from [place] services again if he exhibits no change in willingness to participate in the next 1-2 sessions. " Patient remains on room air with good oxygen saturations.  No coughing or this dyspnea noticed.    Essential hypertension Blood pressure parameters are well controlled.   Severe protein calorie malnutrition Tube feeds via PEG tube at the rate of 36ml/hr.  Further recommendations as per dietician.  SLP requested and as per SLP " Pt allowed cleaning of exterior surfaces of teeth only. Pt would not open his jaw to allow cleaning of inside of oral cavity, pushing my hand away or grabbing at suction swab and pushing it away. Pt refused to accept ice chips or ice cream, tightening lips closed and pushing my hand away. Pt continues to be nonvocal, and does not follow commands. SLP will continue attempts to evaluate po readiness, but pt will be DC'd from [place] services again if he exhibits no change in willingness to participate in the next 1-2 sessions. "     h/o alcohol use  No signs of withdrawal   Stage 4 CKD:  Creatinine at baseline.    Mild anemia of chronic disease: Hemoglobin stable around 12.  No further labs in the last few days       DVT prophylaxis: Lovenox Code Status: Full code Family Communication: (None at bedside Disposition Plan: Pending placement, not safe for discharge home  Consultants:   Neurosurgery  Procedures: S/p decompressive craniectomy, hematoma evacuation.  Antimicrobials: None  Subjective: Alert nonverbal appears to be completely  comfortable.  Objective: Vitals:   11/21/19 0348 11/21/19 0435 11/21/19 0731 11/21/19 1717  BP: 95/70  121/82 120/88  Pulse: 91  97 (!) 105  Resp: 17  20 20   Temp: 97.7 F (36.5 C)  99.1 F (37.3 C) 99.1 F (37.3 C)  TempSrc:   Axillary Axillary  SpO2: 93%  100% 100%  Weight:  57.5 kg    Height:        Intake/Output Summary (Last 24 hours) at 11/21/2019 1802  Last data filed at 11/21/2019 1700 Gross per 24 hour  Intake 182 ml  Output 1151 ml  Net -969 ml   Filed Weights   11/12/19 0532 11/17/19 0439 11/21/19 0435  Weight: 57.7 kg 57.9 kg 57.5 kg    Examination:  General exam: Alert and comfortable Respiratory system: Bilateral air entry fair, no wheezing or rhonchi Cardiovascular system: S1-S2 heard, regular rate rhythm, no JVD Gastrointestinal system: Abdomen is soft, nontender, nondistended, bowel sounds are normal, PEG in place Central nervous system: Alert, nonverbal, able to move upper extremities Extremities: No pedal edema, cyanosis or clubbing Skin: No lesions evident Psychiatry: Calm    Data Reviewed: I have personally reviewed following labs and imaging studies  CBC: Recent Labs  Lab 11/16/19 1029  WBC 6.4  HGB 12.2*  HCT 38.1*  MCV 85.6  PLT [id number]   Basic Metabolic Panel: Recent Labs  Lab 11/16/19 1029  NA 138  K 4.6  CL 98  CO2 28  GLUCOSE 109*  BUN 30*  CREATININE 1.41*  CALCIUM 9.8   GFR: Estimated Creatinine Clearance: 57.2 mL/min (A) (by C-G formula based on SCr of 1.41 mg/dL (H)). Liver Function Tests: No results for input(s): AST, ALT, ALKPHOS, BILITOT, PROT, ALBUMIN in the last 168 hours. No results for input(s): LIPASE, AMYLASE in the last 168 hours. No results for input(s): AMMONIA in the last 168 hours. Coagulation Profile: No results for input(s): INR, PROTIME in the last 168 hours. Cardiac Enzymes: No results for input(s): CKTOTAL, CKMB, CKMBINDEX, TROPONINI in the last 168 hours. BNP (last 3 results) No results for input(s): PROBNP in the last 8760 hours. HbA1C: No results for input(s): HGBA1C in the last 72 hours. CBG: Recent Labs  Lab 11/20/19 2031 11/20/19 2318 11/21/19 0347 11/21/19 0732 11/21/19 1205  GLUCAP 120* 119* 119* 110* 118*   Lipid Profile: No results for input(s): CHOL, HDL, LDLCALC, TRIG, CHOLHDL, LDLDIRECT in the last 72 hours. Thyroid Function Tests: No  results for input(s): TSH, T4TOTAL, FREET4, T3FREE, THYROIDAB in the last 72 hours. Anemia Panel: No results for input(s): VITAMINB12, FOLATE, FERRITIN, TIBC, IRON, RETICCTPCT in the last 72 hours. Sepsis Labs: No results for input(s): PROCALCITON, LATICACIDVEN in the last 168 hours.  No results found for this or any previous visit (from the past 240 hour(s)).       Radiology Studies: No results found.      Scheduled Meds: . alteplase  2 mg Intracatheter Once  . amLODipine  10 mg Per Tube Daily  . chlorhexidine  15 mL Mouth Rinse BID  . enoxaparin (LOVENOX) injection  40 mg Subcutaneous Daily  . folic acid  1 mg Per Tube Daily  . free water  250 mL Per Tube Q4H  . hydrALAZINE  25 mg Per Tube Q8H  . insulin aspart  0-15 Units Subcutaneous Q4H  . levETIRAcetam  500 mg Per Tube BID  . mouth rinse  15 mL Mouth Rinse q12n4p  . metoprolol tartrate  100 mg Per Tube BID  .  multivitamin  15 mL Per Tube Daily  . pantoprazole sodium  40 mg Per Tube BID  . QUEtiapine  50 mg Per Tube QHS  . sodium chloride flush  10-40 mL Intracatheter Q12H  . thiamine  100 mg Per Tube Daily   Continuous Infusions: . feeding supplement (OSMOLITE 1.5 CAL) 1,000 mL (11/21/19 0716)     LOS: 73 days        Hosie Poisson, MD [facility] 11/21/2019, 6:02 PM

## 2019-11-22 LAB — CBC
HCT: 35.7 % — ABNORMAL LOW (ref 39.0–52.0)
Hemoglobin: 11.7 g/dL — ABNORMAL LOW (ref 13.0–17.0)
MCH: 27.7 pg (ref 26.0–34.0)
MCHC: 32.8 g/dL (ref 30.0–36.0)
MCV: 84.6 fL (ref 80.0–100.0)
Platelets: 282 10*3/uL (ref 150–400)
RBC: 4.22 MIL/uL (ref 4.22–5.81)
RDW: 13.4 % (ref 11.5–15.5)
WBC: 6.5 10*3/uL (ref 4.0–10.5)
nRBC: 0 % (ref 0.0–0.2)

## 2019-11-22 LAB — BASIC METABOLIC PANEL
Anion gap: 11 (ref 5–15)
BUN: 29 mg/dL — ABNORMAL HIGH (ref 6–20)
CO2: 27 mmol/L (ref 22–32)
Calcium: 10 mg/dL (ref 8.9–10.3)
Chloride: 99 mmol/L (ref 98–111)
Creatinine, Ser: 1.55 mg/dL — ABNORMAL HIGH (ref 0.61–1.24)
GFR calc Af Amer: >60 mL/min (ref >60)
GFR calc non Af Amer: 56 mL/min — ABNORMAL LOW (ref >60)
Glucose, Bld: 110 mg/dL — ABNORMAL HIGH (ref 70–99)
Potassium: 4.2 mmol/L (ref 3.5–5.1)
Sodium: 137 mmol/L (ref 135–145)

## 2019-11-22 LAB — GLUCOSE, CAPILLARY
Glucose-Capillary: 110 mg/dL — ABNORMAL HIGH (ref 70–99)
Glucose-Capillary: 125 mg/dL — ABNORMAL HIGH (ref 70–99)
Glucose-Capillary: 113 mg/dL — ABNORMAL HIGH (ref 70–99)
Glucose-Capillary: 125 mg/dL — ABNORMAL HIGH (ref 70–99)
Glucose-Capillary: 125 mg/dL — ABNORMAL HIGH (ref 70–99)
Glucose-Capillary: 111 mg/dL — ABNORMAL HIGH (ref 70–99)
Glucose-Capillary: 114 mg/dL — ABNORMAL HIGH (ref 70–99)

## 2019-11-22 MED ORDER — FREE WATER
300.0000 mL | Status: DC
  Administered 2019-11-22 – 2019-12-18 (×157): 300 mL
  Administered 2019-12-19: 60 mL
  Administered 2019-12-19 – 2020-02-14 (×315): 300 mL

## 2019-11-22 NOTE — Progress Notes (Addendum)
PROGRESS NOTE    Joshua [name]  [id number] DOB: 08/07/1981 DOA: 09/09/2019 PCP: Patient, [name]    Brief Narrative:  38 year old male admitted for left hemispheric intracranial hemorrhage, seizure, had a rapidly declining neurological status, subsequently underwent left craniotomy for decompression and evacuation of ICH. -Slow neurological recovery, also treated for seizures and aspiration pneumonia S/p PEG tube on 9/28, remains nonverbal, refuses to work with SLP  -Difficult discharge  Assessment & Plan:   Left intracranial hemorrhage  s/p decompressive craniectomy and hematoma evacuation and implantation of skull flap in the right abdomen by Dr. Vertell Limber in 9/12 with associated seizures. -Remains aphasic/nonverbal with cognitive impairment, inconsistently follows commands -Requires assistance for most movements -Status post PEG tube -PT OT following -Continue Seroquel and PPI -Social work and case management following  Seizure -Secondary to above ICH, continue Keppra  Aspiration pneumonia Dysphagia Completed 7-day course of antibiotics.  S/p PEG tube for dysphagia.  SLP requested and as per SLP " Pt allowed cleaning of exterior surfaces of teeth only. Pt would not open his jaw to allow cleaning of inside of oral cavity, pushing my hand away or grabbing at suction swab and pushing it away. -Unfortunately can continues to decline working with speech therapy   Essential hypertension -Stable  Severe protein calorie malnutrition -Continue tube feeds -See discussion above   h/o alcohol use  No signs of withdrawal  Stage 4 CKD:  Creatinine at baseline.   Mild anemia of chronic disease: Hemoglobin stable    DVT prophylaxis: Lovenox Code Status: Full code Family Communication: Family at bedside Disposition Plan: Social work, case management following, difficult placement   Consultants:   Neurosurgery  Procedures: S/p decompressive craniectomy, hematoma  evacuation.  Antimicrobials: None  Subjective: -Awake alert, tracks with eyes, no events overnight, stable   Objective: Vitals:   11/22/19 0726 11/22/19 1106 11/22/19 1312 11/22/19 1323  BP: (!) 132/97 119/88 107/78 116/88  Pulse: 97 89    Resp: 16 16    Temp: 97.6 F (36.4 C) 99.1 F (37.3 C)    TempSrc: Axillary Oral    SpO2: 97% 100%    Weight:      Height:        Intake/Output Summary (Last 24 hours) at 11/22/2019 1326 Last data filed at 11/22/2019 0403 Gross per 24 hour  Intake 0 ml  Output 1126 ml  Net -1126 ml   Filed Weights   11/17/19 0439 11/21/19 0435 11/22/19 0500  Weight: 57.9 kg 57.5 kg 57.6 kg    Examination:  General exam: Awake and alert, nonverbal, sitting comfortably in bed, no distress Respiratory system: Decreased breath sounds the bases otherwise clear Cardiovascular system: S1-S2 heard, regular rate rhythm, no JVD Gastrointestinal system: Abdomen is soft, nontender, nondistended, bowel sounds are normal, PEG in place Central nervous system: Alert, nonverbal, able to move upper extremities, has spasticity on the right, does not follow commands Extremities: No pedal edema, cyanosis or clubbing Skin: No lesions evident Psychiatry: Unable to assess    Data Reviewed: I have personally reviewed following labs and imaging studies  CBC: Recent Labs  Lab 11/16/19 1029 11/22/19 0226  WBC 6.4 6.5  HGB 12.2* 11.7*  HCT 38.1* 35.7*  MCV 85.6 84.6  PLT 305 [id number]   Basic Metabolic Panel: Recent Labs  Lab 11/16/19 1029 11/22/19 0226  NA 138 137  K 4.6 4.2  CL 98 99  CO2 28 27  GLUCOSE 109* 110*  BUN 30* 29*  CREATININE 1.41* 1.55*  CALCIUM  9.8 10.0   GFR: Estimated Creatinine Clearance: 52.1 mL/min (A) (by C-G formula based on SCr of 1.55 mg/dL (H)). Liver Function Tests: No results for input(s): AST, ALT, ALKPHOS, BILITOT, PROT, ALBUMIN in the last 168 hours. No results for input(s): LIPASE, AMYLASE in the last 168 hours. No results  for input(s): AMMONIA in the last 168 hours. Coagulation Profile: No results for input(s): INR, PROTIME in the last 168 hours. Cardiac Enzymes: No results for input(s): CKTOTAL, CKMB, CKMBINDEX, TROPONINI in the last 168 hours. BNP (last 3 results) No results for input(s): PROBNP in the last 8760 hours. HbA1C: No results for input(s): HGBA1C in the last 72 hours. CBG: Recent Labs  Lab 11/21/19 2026 11/21/19 2332 11/22/19 0402 11/22/19 0728 11/22/19 1103  GLUCAP 105* 104* 125* 113* 125*   Lipid Profile: No results for input(s): CHOL, HDL, LDLCALC, TRIG, CHOLHDL, LDLDIRECT in the last 72 hours. Thyroid Function Tests: No results for input(s): TSH, T4TOTAL, FREET4, T3FREE, THYROIDAB in the last 72 hours. Anemia Panel: No results for input(s): VITAMINB12, FOLATE, FERRITIN, TIBC, IRON, RETICCTPCT in the last 72 hours. Sepsis Labs: No results for input(s): PROCALCITON, LATICACIDVEN in the last 168 hours.  No results found for this or any previous visit (from the past 240 hour(s)).       Radiology Studies: No results found.      Scheduled Meds: . alteplase  2 mg Intracatheter Once  . amLODipine  10 mg Per Tube Daily  . chlorhexidine  15 mL Mouth Rinse BID  . enoxaparin (LOVENOX) injection  40 mg Subcutaneous Daily  . folic acid  1 mg Per Tube Daily  . free water  300 mL Per Tube Q4H  . hydrALAZINE  25 mg Per Tube Q8H  . insulin aspart  0-15 Units Subcutaneous Q4H  . levETIRAcetam  500 mg Per Tube BID  . mouth rinse  15 mL Mouth Rinse q12n4p  . metoprolol tartrate  100 mg Per Tube BID  . multivitamin  15 mL Per Tube Daily  . pantoprazole sodium  40 mg Per Tube BID  . QUEtiapine  50 mg Per Tube QHS  . sodium chloride flush  10-40 mL Intracatheter Q12H  . thiamine  100 mg Per Tube Daily   Continuous Infusions: . feeding supplement (OSMOLITE 1.5 CAL) 1,000 mL (11/21/19 0716)     LOS: 74 days        [name], MD [facility] 11/22/2019, 1:26 PM

## 2019-11-22 NOTE — TOC Progression Note (Signed)
Transition of Care [facility]) - Progression Note    Patient Details  Name: Joshua [name] MRN: [id number] Date of Birth: 10-31-1981  [facility] CM/SW [place], [place] Phone Number: 11/22/2019, 10:37 AM  Clinical Narrative:    Pt remains a difficult placement, at current time no SNF offers. Referrals resent through hub. Pt requires max assistance; not safe for dc home.    Expected Discharge Plan: [place] Barriers to Discharge: Continued Medical Work up, SNF Pending payor source - LOG  Expected Discharge Plan and Services Expected Discharge Plan: [place] In-house Referral: Clinical Social Work Discharge Planning Services: NA Post Acute Care Choice: [place] Living arrangements for the past 2 months: Apartment             DME Arranged: N/A DME Agency: NA  HH Arranged: NA HH Agency: NA   Social Determinants of Health (SDOH) Interventions    Readmission Risk Interventions No flowsheet data found.

## 2019-11-22 NOTE — Progress Notes (Signed)
Occupational Therapy Treatment Patient Details Name: Joshua [name] MRN: [id number] DOB: 20-Apr-1981 Today's Date: 11/22/2019    History of present illness Pt is a 38 y.o. 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 S/p L frontoparietal craniectomy for evacuation of L basal ganglia hematoma 9/12. ETT 9/12-9/18, 9/18-9/23. PEG tube placed 9/28 secondary to dysphagia. PMH includes alcohol abuse, seizures, HTN.   OT comments  Pt demonstrates EOB sitting with brother Joshua [name] present. Pt more engaged with brother sitting with him on the EOB. Pt static standing in sara stedy 10 minutes this session. Pt initiates attempting to move his legs into w/c to position oob. Brother helping to push patient around the unit at the end of session. Rn aware. New elbow splint provided.   Follow Up Recommendations  SNF;Supervision/Assistance - 24 hour    Equipment Recommendations  None recommended by OT    Recommendations for Other Services Rehab consult    Precautions / Restrictions Precautions Precautions: Fall Precaution Comments: L crani, PEG, L hand soft mitt, helmet Required Braces or Orthoses: Splint/Cast Splint/Cast: resting hand splint and elbow splint for L UE due to contracture       Mobility Bed Mobility Overal bed mobility: Needs Assistance Bed Mobility: Supine to Sit     Supine to sit: Max assist;+2 for physical assistance     General bed mobility comments: pt physically pulling at rails and sheets to resist upright posture. pt noted to have beating nystagmus with repositioning  Transfers Overall transfer level: Needs assistance   Transfers: Sit to/from Stand Sit to Stand: +2 physical assistance;+2 safety/equipment;Max assist         General transfer comment: Pt required physical assistance to place feet on foot plate.  ONce feet in place used lift to elevate patient into standing.  He stood x 10 min with poor head and upper trunk  control.  pt does engage due to brother lucky in front of patient with rap music playing    Balance Overall balance assessment: Needs assistance   Sitting balance-Leahy Scale: Poor       Standing balance-Leahy Scale: Poor                             ADL either performed or assessed with clinical judgement   ADL Overall ADL's : Needs assistance/impaired Eating/Feeding: NPO                                     General ADL Comments: pt EOB for neuro reeducation static sitting. pt resistant at first but progressed to mod (A) at EOB. pt positioned in sara plus to stand for 10 minutes with facilitation at hips and R knee extension. pt with weight shift noted by patient without therapist.      Vision   Additional Comments: beating nystagmus with static standing. pt with beating nystagmus attempting to scan upward   Perception     Praxis      Cognition Arousal/Alertness: Awake/alert Behavior During Therapy: Flat affect Overall Cognitive Status: Impaired/Different from baseline Area of Impairment: Attention;Following commands                   Current Attention Level: Sustained   Following Commands: Follows one step commands inconsistently Safety/Judgement: Decreased awareness of safety;Decreased awareness of deficits   Problem Solving: Slow processing;Decreased initiation;Difficulty sequencing  General Comments: pt pushing away from therapist and resistant initially. Brother "lucky" arriving and more agreeable to his facilitation of movement. pt sitting EOB with brother on is L side holding his hand        Exercises Other Exercises Other Exercises: provided new elbow extension splint this session .brother lucky to take old splint home and wash it with care instructions provided to brother   Shoulder Instructions       General Comments      Pertinent Vitals/ Pain       Pain Assessment: No/denies pain  Home Living                                           Prior Functioning/Environment              Frequency  Min 2X/week        Progress Toward Goals  OT Goals(current goals can now be found in the care plan section)  Progress towards OT goals: Progressing toward goals  Acute Rehab OT Goals Patient Stated Goal: none stated OT Goal Formulation: Patient unable to participate in goal setting Time For Goal Achievement: 11/28/19 Potential to Achieve Goals: Good ADL Goals Pt Will Perform Grooming: sitting;with mod assist Pt/caregiver will Perform Home Exercise Program: Increased ROM;Increased strength;Right Upper extremity;With minimal assist Additional ADL Goal #1: Pt will maintain static balance sitting EOB with modA to increase independence with ADL task.(progressing but unable to sustain) Additional ADL Goal #2: Pt will track towards R visual field with no more than mod cues. Additional ADL Goal #3: Pt will follow one step commands with 50% accuracy during functional task.  Plan Discharge plan remains appropriate    Co-evaluation    PT/OT/SLP Co-Evaluation/Treatment: Yes Reason for Co-Treatment: Complexity of the patient's impairments (multi-system involvement);Necessary to address cognition/behavior during functional activity;For patient/therapist safety;To address functional/ADL transfers   OT goals addressed during session: ADL's and self-care;Strengthening/ROM      AM-PAC OT "6 Clicks" Daily Activity     Outcome Measure   Help from another person eating meals?: Total Help from another person taking care of personal grooming?: Total Help from another person toileting, which includes using toliet, bedpan, or urinal?: Total Help from another person bathing (including washing, rinsing, drying)?: Total Help from another person to put on and taking off regular upper body clothing?: Total Help from another person to put on and taking off regular lower body clothing?: Total 6 Click  Score: 6    End of Session Equipment Utilized During Treatment: Gait belt;Other (comment)(helmet)  OT Visit Diagnosis: Other abnormalities of gait and mobility (R26.89);Hemiplegia and hemiparesis;Cognitive communication deficit (R41.841) Symptoms and signs involving cognitive functions: Other Nontraumatic ICH Hemiplegia - Right/Left: Right Hemiplegia - caused by: Other Nontraumatic intracranial hemorrhage   Activity Tolerance Patient tolerated treatment well   Patient Left in chair;with call bell/phone within reach;Other (comment)(brother pushing him around unit)   Nurse Communication Mobility status;Need for lift equipment;Precautions        Time: [id number] OT Time Calculation (min): 24 min  Charges: OT General Charges $OT Visit: 1 Visit OT Treatments $Neuromuscular Re-education: 8-22 mins   Brynn, OTR/L  Acute Rehabilitation Services Pager: [phone] Office: [phone] .    Jeri Modena 11/22/2019, 2:10 PM

## 2019-11-22 NOTE — Progress Notes (Signed)
Physical Therapy Treatment Patient Details Name: Joshua [name] MRN: [id number] DOB: 07/17/1981 Today's Date: 11/22/2019    History of Present Illness Pt is a 38 y.o. 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 S/p L frontoparietal craniectomy for evacuation of L basal ganglia hematoma 9/12. ETT 9/12-9/18, 9/18-9/23. PEG tube placed 9/28 secondary to dysphagia. PMH includes alcohol abuse, seizures, HTN.    PT Comments    Pt performed bed mobility and transfer training with max VCs for activity.  Pt resistive to mobility and reaching and pulling on sheets and railing.  When [place] his brother entered he was more receptive to moving and participating.  Focused on upper trunk control, facilitation of weight shifting, R knee extension and head control.  Post standing trial transferred patient to tilit in space WC.     Follow Up Recommendations  SNF     Equipment Recommendations  Wheelchair (measurements PT);Wheelchair cushion (measurements PT);Hospital bed;3in1 (PT)    Recommendations for Other Services Rehab consult     Precautions / Restrictions Precautions Precautions: Fall Precaution Comments: L crani, PEG, L hand soft mitt, helmet Required Braces or Orthoses: Splint/Cast Splint/Cast: resting hand splint and elbow splint for L UE due to contracture    Mobility  Bed Mobility Overal bed mobility: Needs Assistance Bed Mobility: Supine to Sit;Sit to Supine     Supine to sit: Max assist;+2 for physical assistance     General bed mobility comments: pt physically pulling at rails and sheets to resist upright posture. pt noted to have beating nystagmus with repositioning  Transfers Overall transfer level: Needs assistance Equipment used: Ambulation equipment used(sara +) Transfers: Sit to/from Stand Sit to Stand: +2 physical assistance;+2 safety/equipment;Max assist Stand pivot transfers: Total assist;+2 physical assistance        General transfer comment: Pt required physical assistance to place feet on foot plate.  ONce feet in place used lift to elevate patient into standing.  He stood x 10 min with poor head and upper trunk control.  pt does engage due to brother lucky in front of patient with rap music playing(continues to require assistance to facilitate weight shift L and R and for knee extension in standing on R side.  Constant cues for upper trunk control)  Ambulation/Gait Ambulation/Gait assistance: (NT)           General Gait Details: unable at this time   Stairs             Wheelchair Mobility    Modified Rankin (Stroke Patients Only)       Balance Overall balance assessment: Needs assistance Sitting-balance support: Feet supported;Single extremity supported Sitting balance-Leahy Scale: Poor       Standing balance-Leahy Scale: Poor                              Cognition Arousal/Alertness: Awake/alert Behavior During Therapy: Flat affect Overall Cognitive Status: Impaired/Different from baseline Area of Impairment: Attention;Following commands                   Current Attention Level: Sustained Memory: Decreased short-term memory Following Commands: Follows one step commands inconsistently Safety/Judgement: Decreased awareness of safety;Decreased awareness of deficits Awareness: Intellectual Problem Solving: Slow processing;Decreased initiation;Difficulty sequencing General Comments: pt pushing away from therapist and resistant initially. Brother "lucky" arriving and more agreeable to his facilitation of movement. pt sitting EOB with brother on is L side holding his  hand      Exercises Other Exercises Other Exercises: provided new elbow extension splint this session .brother lucky to take old splint home and wash it with care instructions provided to brother    General Comments        Pertinent Vitals/Pain Pain Assessment: Faces Pain Score: 4   Pain Location: general discomfort. Pain Descriptors / Indicators: Guarding;Grimacing Pain Intervention(s): Monitored during session;Repositioned    Home Living                      Prior Function            PT Goals (current goals can now be found in the care plan section) Acute Rehab PT Goals Patient Stated Goal: none stated Potential to Achieve Goals: Fair Progress towards PT goals: Progressing toward goals    Frequency    Min 2X/week      PT Plan Current plan remains appropriate    Co-evaluation PT/OT/SLP Co-Evaluation/Treatment: Yes Reason for Co-Treatment: Complexity of the patient's impairments (multi-system involvement);For patient/therapist safety;Necessary to address cognition/behavior during functional activity;To address functional/ADL transfers PT goals addressed during session: Mobility/safety with mobility OT goals addressed during session: ADL's and self-care      AM-PAC PT "6 Clicks" Mobility   Outcome Measure  Help needed turning from your back to your side while in a flat bed without using bedrails?: Total Help needed moving from lying on your back to sitting on the side of a flat bed without using bedrails?: Total Help needed moving to and from a bed to a chair (including a wheelchair)?: Total Help needed standing up from a chair using your arms (e.g., wheelchair or bedside chair)?: Total Help needed to walk in hospital room?: Total Help needed climbing 3-5 steps with a railing? : Total 6 Click Score: 6    End of Session Equipment Utilized During Treatment: Gait belt Activity Tolerance: Patient limited by fatigue Patient left: with call bell/phone within reach;with bed alarm set;in bed;Other (comment);with family/visitor present Nurse Communication: Mobility status PT Visit Diagnosis: Muscle weakness (generalized) (M62.81);Difficulty in walking, not elsewhere classified (R26.2);Hemiplegia and hemiparesis Hemiplegia - Right/Left:  Right Hemiplegia - dominant/non-dominant: Dominant Hemiplegia - caused by: Nontraumatic intracerebral hemorrhage     Time: [id number] PT Time Calculation (min) (ACUTE ONLY): 30 min  Charges:  $Therapeutic Activity: 8-22 mins                     [name] , PTA Acute Rehabilitation Services Pager [phone] Office [phone]     Joshua [name] 11/22/2019, 3:58 PM

## 2019-11-23 LAB — BASIC METABOLIC PANEL
Anion gap: 9 (ref 5–15)
BUN: 29 mg/dL — ABNORMAL HIGH (ref 6–20)
CO2: 28 mmol/L (ref 22–32)
Calcium: 9.7 mg/dL (ref 8.9–10.3)
Chloride: 100 mmol/L (ref 98–111)
Creatinine, Ser: 1.5 mg/dL — ABNORMAL HIGH (ref 0.61–1.24)
GFR calc Af Amer: >60 mL/min (ref >60)
GFR calc non Af Amer: 58 mL/min — ABNORMAL LOW (ref >60)
Glucose, Bld: 117 mg/dL — ABNORMAL HIGH (ref 70–99)
Potassium: 4.1 mmol/L (ref 3.5–5.1)
Sodium: 137 mmol/L (ref 135–145)

## 2019-11-23 LAB — GLUCOSE, CAPILLARY
Glucose-Capillary: 105 mg/dL — ABNORMAL HIGH (ref 70–99)
Glucose-Capillary: 109 mg/dL — ABNORMAL HIGH (ref 70–99)
Glucose-Capillary: 122 mg/dL — ABNORMAL HIGH (ref 70–99)
Glucose-Capillary: 125 mg/dL — ABNORMAL HIGH (ref 70–99)
Glucose-Capillary: 85 mg/dL (ref 70–99)

## 2019-11-23 NOTE — Progress Notes (Signed)
Patient seen and examined, care discussed with RN, no changes -Continue current care  [name], MD

## 2019-11-24 DIAGNOSIS — Z4659 Encounter for fitting and adjustment of other gastrointestinal appliance and device: Secondary | ICD-10-CM

## 2019-11-24 LAB — GLUCOSE, CAPILLARY
Glucose-Capillary: 99 mg/dL (ref 70–99)
Glucose-Capillary: 98 mg/dL (ref 70–99)
Glucose-Capillary: 128 mg/dL — ABNORMAL HIGH (ref 70–99)
Glucose-Capillary: 117 mg/dL — ABNORMAL HIGH (ref 70–99)
Glucose-Capillary: 109 mg/dL — ABNORMAL HIGH (ref 70–99)
Glucose-Capillary: 111 mg/dL — ABNORMAL HIGH (ref 70–99)

## 2019-11-24 NOTE — Progress Notes (Signed)
Patient seen and examined, discussed with staff nurse -No changes, continues to tolerate tube feeds -Refuses to engage with staff -Continue current care -Disposition remains challenging  [name], MD

## 2019-11-24 NOTE — Progress Notes (Signed)
  Speech Language Pathology Treatment: Dysphagia  Patient Details Name: Joshua [name] MRN: [id number] DOB: 10-28-1981 Today's Date: 11/24/2019 Time: 1020-1030 SLP Time Calculation (min) (ACUTE ONLY): 10 min  Assessment / Plan / Recommendation Clinical Impression  Pt seen for ongoing dysphagia management. SLP recieved pt in bed, alert but despite many attempts by this writer, pt didn't engage in PO trials or in participatory activities with this Probation officer. Pt held lips together during any attempts at oral care (with or without suction). SLP offered pt ice cream (favorite per previous report by family). Pt pushed my hand away and turned his head. Unfortunately, pt continues to demonstrate behaviors of refusal and is no longer appropriate for skilled ST services. He has not participate in ST sessions over the last 3 sessions. ST to sign off.    HPI HPI: Pt is a 38 year old male who presented with new seizure, and was found to have a large left BG intracranial hemorrhage s/p L frontal temporal craniectomy with evacuation 9/12. ETT 9/12-9/18; 9/18-9/23. PMH: HTN, alcoholism, noncompliance      SLP Plan  Discharge SLP treatment due to (comment)(lack of participation)       Recommendations  Diet recommendations: NPO Medication Administration: Via alternative means                Oral Care Recommendations: Oral care QID;Staff/trained caregiver to provide oral care Follow up Recommendations: Skilled Nursing facility SLP Visit Diagnosis: Dysphagia, unspecified (R13.10) Plan: Discharge SLP treatment due to (comment)(lack of participation)       [place] 11/24/2019, 1:28 PM

## 2019-11-25 LAB — GLUCOSE, CAPILLARY
Glucose-Capillary: 108 mg/dL — ABNORMAL HIGH (ref 70–99)
Glucose-Capillary: 112 mg/dL — ABNORMAL HIGH (ref 70–99)
Glucose-Capillary: 113 mg/dL — ABNORMAL HIGH (ref 70–99)
Glucose-Capillary: 114 mg/dL — ABNORMAL HIGH (ref 70–99)
Glucose-Capillary: 114 mg/dL — ABNORMAL HIGH (ref 70–99)
Glucose-Capillary: 140 mg/dL — ABNORMAL HIGH (ref 70–99)
Glucose-Capillary: 79 mg/dL (ref 70–99)

## 2019-11-25 NOTE — Progress Notes (Signed)
Patient seen and examined, discussed with nurse -No changes, continues to tolerate tube feeds -Continues to decline participation with speech therapy for swallowing assessments -Continue current care  [name], MD

## 2019-11-26 LAB — CBC
HCT: 36.3 % — ABNORMAL LOW (ref 39.0–52.0)
Hemoglobin: 11.7 g/dL — ABNORMAL LOW (ref 13.0–17.0)
MCH: 27.2 pg (ref 26.0–34.0)
MCHC: 32.2 g/dL (ref 30.0–36.0)
MCV: 84.4 fL (ref 80.0–100.0)
Platelets: 299 10*3/uL (ref 150–400)
RBC: 4.3 MIL/uL (ref 4.22–5.81)
RDW: 13.2 % (ref 11.5–15.5)
WBC: 7 10*3/uL (ref 4.0–10.5)
nRBC: 0 % (ref 0.0–0.2)

## 2019-11-26 LAB — BASIC METABOLIC PANEL
Anion gap: 11 (ref 5–15)
BUN: 27 mg/dL — ABNORMAL HIGH (ref 6–20)
CO2: 27 mmol/L (ref 22–32)
Calcium: 10.1 mg/dL (ref 8.9–10.3)
Chloride: 98 mmol/L (ref 98–111)
Creatinine, Ser: 1.37 mg/dL — ABNORMAL HIGH (ref 0.61–1.24)
GFR calc Af Amer: >60 mL/min (ref >60)
GFR calc non Af Amer: >60 mL/min (ref >60)
Glucose, Bld: 112 mg/dL — ABNORMAL HIGH (ref 70–99)
Potassium: 4.3 mmol/L (ref 3.5–5.1)
Sodium: 136 mmol/L (ref 135–145)

## 2019-11-26 LAB — GLUCOSE, CAPILLARY
Glucose-Capillary: 117 mg/dL — ABNORMAL HIGH (ref 70–99)
Glucose-Capillary: 126 mg/dL — ABNORMAL HIGH (ref 70–99)
Glucose-Capillary: 127 mg/dL — ABNORMAL HIGH (ref 70–99)
Glucose-Capillary: 144 mg/dL — ABNORMAL HIGH (ref 70–99)
Glucose-Capillary: 88 mg/dL (ref 70–99)
Glucose-Capillary: 99 mg/dL (ref 70–99)

## 2019-11-26 NOTE — Progress Notes (Signed)
Patient seen and examined -No changes from recent baseline -Continue tube feeds and current care  [name], MD

## 2019-11-27 LAB — GLUCOSE, CAPILLARY
Glucose-Capillary: 117 mg/dL — ABNORMAL HIGH (ref 70–99)
Glucose-Capillary: 103 mg/dL — ABNORMAL HIGH (ref 70–99)
Glucose-Capillary: 111 mg/dL — ABNORMAL HIGH (ref 70–99)
Glucose-Capillary: 110 mg/dL — ABNORMAL HIGH (ref 70–99)
Glucose-Capillary: 87 mg/dL (ref 70–99)
Glucose-Capillary: 147 mg/dL — ABNORMAL HIGH (ref 70–99)

## 2019-11-27 MED ORDER — OSMOLITE 1.2 CAL PO LIQD
474.0000 mL | Freq: Four times a day (QID) | ORAL | Status: DC
  Administered 2019-11-27 – 2020-01-08 (×154): 474 mL
  Filled 2019-11-27 (×6): qty 474
  Filled 2019-11-27: qty 1000
  Filled 2019-11-27 (×3): qty 474
  Filled 2019-11-27: qty 1000
  Filled 2019-11-27: qty 474
  Filled 2019-11-27: qty 1000
  Filled 2019-11-27 (×3): qty 474
  Filled 2019-11-27: qty 1000
  Filled 2019-11-27 (×4): qty 474
  Filled 2019-11-27: qty 1000
  Filled 2019-11-27 (×3): qty 474
  Filled 2019-11-27 (×2): qty 1000
  Filled 2019-11-27: qty 474
  Filled 2019-11-27: qty 1000
  Filled 2019-11-27 (×2): qty 474
  Filled 2019-11-27 (×2): qty 1000
  Filled 2019-11-27 (×2): qty 474
  Filled 2019-11-27: qty 1000
  Filled 2019-11-27: qty 474
  Filled 2019-11-27 (×3): qty 1000
  Filled 2019-11-27 (×4): qty 474
  Filled 2019-11-27: qty 1000
  Filled 2019-11-27 (×6): qty 474
  Filled 2019-11-27: qty 1000
  Filled 2019-11-27 (×2): qty 474
  Filled 2019-11-27: qty 1000
  Filled 2019-11-27 (×3): qty 474
  Filled 2019-11-27: qty 1000
  Filled 2019-11-27 (×9): qty 474
  Filled 2019-11-27: qty 1000
  Filled 2019-11-27 (×2): qty 474
  Filled 2019-11-27 (×4): qty 1000
  Filled 2019-11-27 (×4): qty 474
  Filled 2019-11-27 (×2): qty 1000
  Filled 2019-11-27 (×2): qty 474
  Filled 2019-11-27: qty 1000
  Filled 2019-11-27: qty 474
  Filled 2019-11-27: qty 1000
  Filled 2019-11-27 (×3): qty 474
  Filled 2019-11-27 (×3): qty 1000
  Filled 2019-11-27 (×3): qty 474
  Filled 2019-11-27 (×3): qty 1000
  Filled 2019-11-27 (×3): qty 474
  Filled 2019-11-27: qty 1000
  Filled 2019-11-27: qty 474
  Filled 2019-11-27: qty 1000
  Filled 2019-11-27 (×3): qty 474
  Filled 2019-11-27: qty 1000
  Filled 2019-11-27 (×3): qty 474
  Filled 2019-11-27: qty 1000
  Filled 2019-11-27 (×3): qty 474
  Filled 2019-11-27: qty 1000
  Filled 2019-11-27: qty 474
  Filled 2019-11-27: qty 1000
  Filled 2019-11-27 (×3): qty 474
  Filled 2019-11-27 (×2): qty 1000
  Filled 2019-11-27: qty 474
  Filled 2019-11-27: qty 1000
  Filled 2019-11-27 (×4): qty 474
  Filled 2019-11-27 (×2): qty 1000
  Filled 2019-11-27 (×2): qty 474
  Filled 2019-11-27: qty 1000
  Filled 2019-11-27 (×3): qty 474
  Filled 2019-11-27: qty 1000
  Filled 2019-11-27 (×2): qty 474
  Filled 2019-11-27: qty 1000
  Filled 2019-11-27: qty 474
  Filled 2019-11-27: qty 1000
  Filled 2019-11-27 (×3): qty 474
  Filled 2019-11-27 (×2): qty 1000
  Filled 2019-11-27 (×2): qty 474
  Filled 2019-11-27: qty 1000
  Filled 2019-11-27: qty 474
  Filled 2019-11-27: qty 1000
  Filled 2019-11-27 (×3): qty 474
  Filled 2019-11-27: qty 1000
  Filled 2019-11-27 (×2): qty 474
  Filled 2019-11-27: qty 1000
  Filled 2019-11-27 (×6): qty 474
  Filled 2019-11-27 (×2): qty 1000
  Filled 2019-11-27 (×4): qty 474
  Filled 2019-11-27: qty 1000
  Filled 2019-11-27: qty 474
  Filled 2019-11-27: qty 1000
  Filled 2019-11-27 (×3): qty 474
  Filled 2019-11-27: qty 1000

## 2019-11-27 MED ORDER — HYDRALAZINE HCL 10 MG PO TABS
10.0000 mg | ORAL_TABLET | Freq: Three times a day (TID) | ORAL | Status: DC
  Administered 2019-11-27 – 2019-12-12 (×45): 10 mg
  Filled 2019-11-27 (×45): qty 1

## 2019-11-27 MED ORDER — PRO-STAT SUGAR FREE PO LIQD
30.0000 mL | Freq: Two times a day (BID) | ORAL | Status: DC
  Administered 2019-11-28 – 2020-01-16 (×93): 30 mL
  Filled 2019-11-27 (×90): qty 30

## 2019-11-27 NOTE — Progress Notes (Addendum)
PROGRESS NOTE    Joshua [name]  [id number] DOB: [birth year] DOA: 09/09/2019 PCP: Patient, [name]    Brief Narrative:   38 year old male admitted by neurology on 9/12 with seizure, right hemiplegia, decreased responsiveness found to have left hemispheric intracranial hemorrhage, subsequently had a rapidly declining neurological status, intubated and admitted to ICU, underwent left craniotomy for decompression and evacuation of ICH on 9/12 by Dr. Vertell Limber -Continues to have slow neurological recovery, prolonged hospitalization treated for seizures and aspiration pneumonia -At this time remains nonverbal, awake and alert but declines to work with SLP -Underwent PEG tube placement on 9/28 -Disposition continues to be challenging, uninsured, needs long-term placement   Assessment & Plan:   Left intracranial hemorrhage  s/p decompressive craniectomy and hematoma evacuation and implantation of skull flap in the right abdomen by Dr. Vertell Limber in 9/12 with associated seizures. -Felt to be secondary to uncontrolled, untreated hypertension, CTA was negative for aneurysm or vascular malformation -Remains aphasic/nonverbal with dense right hemiplegia , cognitive impairment, inconsistently follows commands -Status post PEG tube tolerating tube feeds -PT OT following -Continue Seroquel and PPI -Social work and case management following  Seizure -Secondary to above ICH, continue Keppra  Aspiration pneumonia Dysphagia Completed 7-day course of antibiotics.  Over a month ago S/p PEG tube for dysphagia.  SLP requested and as per SLP " Pt allowed cleaning of exterior surfaces of teeth only. Pt would not open his jaw to allow cleaning of inside of oral cavity, pushing my hand away or grabbing at suction swab and pushing it away.   -Unfortunately can continues to decline working with speech therapy  -SLP finally signed off 11/28  Essential hypertension -Stable, continue amlodipine, decrease  hydralazine dose  Severe protein calorie malnutrition -Continue tube feeds -See discussion above   h/o alcohol use  No signs of withdrawal  Stage 4 CKD:  Creatinine at baseline.   Mild anemia of chronic disease: Hemoglobin stable    DVT prophylaxis: Lovenox Code Status: Full code Family Communication: no Family at bedside Disposition Plan: Social work, case management following, difficult placement   Consultants:   Neurosurgery  Procedures: S/p decompressive craniectomy, hematoma evacuation.  Antimicrobials: None  Subjective: - no events overnight, stable and tolerating tube feeds  Objective: Vitals:   11/26/19 2345 11/27/19 0445 11/27/19 0500 11/27/19 0722  BP: 114/77 120/70  118/83  Pulse: 93 93  (!) 101  Resp: 16 19  16   Temp: 98.4 F (36.9 C) 98 F (36.7 C)  99 F (37.2 C)  TempSrc: Oral Oral  Oral  SpO2: 99% 99%  100%  Weight:   59.4 kg   Height:        Intake/Output Summary (Last 24 hours) at 11/27/2019 1105 Last data filed at 11/27/2019 0941 Gross per 24 hour  Intake -  Output 1300 ml  Net -1300 ml   Filed Weights   11/24/19 0500 11/25/19 0500 11/27/19 0500  Weight: 60.9 kg 59.9 kg 59.4 kg    Examination:  Gen: Awake alert, nonverbal, sitting up in bed, no distress HEENT: PERRLA, Neck supple, no JVD Lungs: Decreased breath sounds the bases otherwise clear CVS: RRR,No Gallops,Rubs or new Murmurs Abd: Soft, nontender, nondistended, bowel sounds present, PEG tube noted Extremities: No edema Skin: no new rashes Central nervous system: Awake alert, nonverbal, dense right hemiplegia with spasticity, contractures Psychiatry: Unable to assess    Data Reviewed: I have personally reviewed following labs and imaging studies  CBC: Recent Labs  Lab 11/22/19 0226 11/26/19 0458  WBC 6.5 7.0  HGB 11.7* 11.7*  HCT 35.7* 36.3*  MCV 84.6 84.4  PLT 282 [id number]   Basic Metabolic Panel: Recent Labs  Lab 11/22/19 0226 11/23/19 0318 11/26/19 0458   NA 137 137 136  K 4.2 4.1 4.3  CL 99 100 98  CO2 27 28 27   GLUCOSE 110* 117* 112*  BUN 29* 29* 27*  CREATININE 1.55* 1.50* 1.37*  CALCIUM 10.0 9.7 10.1   GFR: Estimated Creatinine Clearance: 60.8 mL/min (A) (by C-G formula based on SCr of 1.37 mg/dL (H)). Liver Function Tests: No results for input(s): AST, ALT, ALKPHOS, BILITOT, PROT, ALBUMIN in the last 168 hours. No results for input(s): LIPASE, AMYLASE in the last 168 hours. No results for input(s): AMMONIA in the last 168 hours. Coagulation Profile: No results for input(s): INR, PROTIME in the last 168 hours. Cardiac Enzymes: No results for input(s): CKTOTAL, CKMB, CKMBINDEX, TROPONINI in the last 168 hours. BNP (last 3 results) No results for input(s): PROBNP in the last 8760 hours. HbA1C: No results for input(s): HGBA1C in the last 72 hours. CBG: Recent Labs  Lab 11/26/19 1522 11/26/19 2015 11/26/19 2345 11/27/19 0417 11/27/19 0721  GLUCAP 126* 127* 99 117* 103*   Lipid Profile: No results for input(s): CHOL, HDL, LDLCALC, TRIG, CHOLHDL, LDLDIRECT in the last 72 hours. Thyroid Function Tests: No results for input(s): TSH, T4TOTAL, FREET4, T3FREE, THYROIDAB in the last 72 hours. Anemia Panel: No results for input(s): VITAMINB12, FOLATE, FERRITIN, TIBC, IRON, RETICCTPCT in the last 72 hours. Sepsis Labs: No results for input(s): PROCALCITON, LATICACIDVEN in the last 168 hours.  No results found for this or any previous visit (from the past 240 hour(s)).       Radiology Studies: No results found.      Scheduled Meds: . alteplase  2 mg Intracatheter Once  . amLODipine  10 mg Per Tube Daily  . chlorhexidine  15 mL Mouth Rinse BID  . enoxaparin (LOVENOX) injection  40 mg Subcutaneous Daily  . folic acid  1 mg Per Tube Daily  . free water  300 mL Per Tube Q4H  . hydrALAZINE  25 mg Per Tube Q8H  . insulin aspart  0-15 Units Subcutaneous Q4H  . levETIRAcetam  500 mg Per Tube BID  . mouth rinse  15 mL  Mouth Rinse q12n4p  . metoprolol tartrate  100 mg Per Tube BID  . multivitamin  15 mL Per Tube Daily  . pantoprazole sodium  40 mg Per Tube BID  . QUEtiapine  50 mg Per Tube QHS  . sodium chloride flush  10-40 mL Intracatheter Q12H  . thiamine  100 mg Per Tube Daily   Continuous Infusions: . feeding supplement (OSMOLITE 1.5 CAL) 1,000 mL (11/25/19 2233)     LOS: 79 days   [name], MD [facility] 11/27/2019, 11:05 AM

## 2019-11-27 NOTE — Progress Notes (Signed)
Nutrition Follow-up  DOCUMENTATION CODES:   Severe malnutrition in context of acute illness/injury  INTERVENTION:  Stop current continuous tube feeds.  Transition to bolus tube feeds using Osmolite 1.2 formula via PEG at starting volume of 120 ml (half carton/ARC) and advance by 120 ml at each feeding to goal volume of 474 ml (2 cartons/ARCs) given QID.   Provide 30 ml Prostat BID per tube.   Free water flushes of 300 ml every 4 hours per tube. (MD to adjust as appropriate)  Tube feeding to provide 2475 kcal, 135 grams of protein, and 3355 ml free water.   NUTRITION DIAGNOSIS:   Severe Malnutrition related to acute illness(intracerebral hemorrage) as evidenced by severe fat depletion, severe muscle depletion; ongoing  GOAL:   Patient will meet greater than or equal to 90% of their needs; met with TF  MONITOR:   TF tolerance, Skin, Weight trends, Labs, I & O's  REASON FOR ASSESSMENT:   Ventilator, Consult Enteral/tube feeding initiation and management  ASSESSMENT:   38 yo male admitted with large left intracranial hemorrhage; S/P craniotomy. PMH includes HTN, alcoholism, noncompliance.  9/12left frontal temporal craniectomy and evacuation hematoma.  9/23 Extubated.  10/1 PEG placed 11/23- Nutrition Focused Physical Exam performed, severe subcutaneous fat loss and severe muscle wasting identified.  11/28- SLP signed off, pt refusing oral care and po intake  Pt continues NPO status and continues on tube feeding. Pt has been tolerating his feedings well with no difficulties. RD to transition pt to bolus tube feeds via PEG. RD to continue to monitor for tolerance.   Labs and medications reviewed.   Diet Order:   Diet Order            Diet NPO time specified  Diet effective midnight              EDUCATION NEEDS:   Not appropriate for education at this time  Skin:  Skin Assessment: Reviewed RN Assessment  Last BM:  11/28  Height:   Ht Readings from Last 1  Encounters:  09/09/19 '5\' 9"'  (1.753 m)    Weight:   Wt Readings from Last 1 Encounters:  11/27/19 59.4 kg    Ideal Body Weight:  72.7 kg  BMI:  Body mass index is 19.34 kg/m.  Estimated Nutritional Needs:   Kcal:  2100-2400  Protein:  105-125 grams  Fluid:  >/= 2 L/day    Corrin Parker, MS, RD, LDN Pager # [phone] After hours/ weekend pager # [phone]

## 2019-11-27 NOTE — Progress Notes (Signed)
Occupational Therapy Treatment Patient Details Name: Joshua [name] MRN: [id number] DOB: 04-Sep-1981 Today's Date: 11/27/2019    History of present illness Pt is a 38 y.o. 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 S/p L frontoparietal craniectomy for evacuation of L basal ganglia hematoma 9/12. ETT 9/12-9/18, 9/18-9/23. PEG tube placed 9/28 secondary to dysphagia. PMH includes alcohol abuse, seizures, HTN.   OT comments  Pt seen for OT f/u, resistive this date to treatment. Pt completed bed mobility with max A +2. Pt very perseverative on gripping onto blankets and not releasing grip. Attempted to use stedy, pt very resistive and not safe to continue to standing. Pt did have evidence of localized responses this date: taking off helmet and pulling for blankets after they had been moved. Attempted music and incorporating cell phone, pt not engaging at this time. Passive stretching completing with RUE. Applied RUE resting hand splint and soft elbow splint and communicated with RN the wearing schedule. D/c recs remain appropriate. Will continue to follow.   Follow Up Recommendations  SNF;Supervision/Assistance - 24 hour    Equipment Recommendations  None recommended by OT    Recommendations for Other Services      Precautions / Restrictions Precautions Precautions: Fall Precaution Comments: L crani, PEG, L hand soft mitt, helmet Required Braces or Orthoses: Splint/Cast Splint/Cast: resting hand splint and elbow splint for R UE due synergistic pattern Restrictions Weight Bearing Restrictions: No       Mobility Bed Mobility Overal bed mobility: Needs Assistance Bed Mobility: Supine to Sit;Sit to Supine Rolling: +2 for physical assistance;Max assist   Supine to sit: Max assist;+2 for physical assistance     General bed mobility comments: MaxAx2 due to patient not following commands and physically resisting  Transfers                  General transfer comment: physically resisted and refused, unable to attempt today    Balance Overall balance assessment: Needs assistance Sitting-balance support: Feet supported;Single extremity supported Sitting balance-Leahy Scale: Poor Sitting balance - Comments: strong posterior push. Postural control: Right lateral lean                                 ADL either performed or assessed with clinical judgement   ADL Overall ADL's : Needs assistance/impaired Eating/Feeding: NPO   Grooming: Maximal assistance;Sitting Grooming Details (indicate cue type and reason): hand over hand assist                                General ADL Comments: Pt sitting EOB with focus on RUE neuro reeducation and splint donning. Facilitated grooming with pt at hand over hand assist with LUE, pt more fixated on grasping blanket than engaging     Vision       Perception     Praxis      Cognition Arousal/Alertness: Awake/alert Behavior During Therapy: Flat affect Overall Cognitive Status: Impaired/Different from baseline Area of Impairment: Attention;Following commands;Safety/judgement;Awareness;Problem solving                   Current Attention Level: Sustained Memory: Decreased short-term memory Following Commands: Follows one step commands inconsistently Safety/Judgement: Decreased awareness of safety;Decreased awareness of deficits Awareness: Intellectual Problem Solving: Slow processing;Decreased initiation;Difficulty sequencing;Requires verbal cues;Requires tactile cues General Comments: pt resistant this session, perseverating  on pulling blankets away from therapist        Exercises     Shoulder Instructions       General Comments      Pertinent Vitals/ Pain       Pain Assessment: Faces Faces Pain Scale: Hurts a little bit Pain Location: moving body away from pain when ranging RUE Pain Descriptors / Indicators: Guarding Pain  Intervention(s): Limited activity within patient's tolerance;Monitored during session;Repositioned  Home Living                                          Prior Functioning/Environment              Frequency  Min 2X/week        Progress Toward Goals  OT Goals(current goals can now be found in the care plan section)  Progress towards OT goals: Progressing toward goals  Acute Rehab OT Goals Patient Stated Goal: none stated OT Goal Formulation: Patient unable to participate in goal setting Time For Goal Achievement: 12/06/19 Potential to Achieve Goals: Good  Plan Discharge plan remains appropriate    Co-evaluation    PT/OT/SLP Co-Evaluation/Treatment: Yes Reason for Co-Treatment: Necessary to address cognition/behavior during functional activity;For patient/therapist safety;To address functional/ADL transfers PT goals addressed during session: Mobility/safety with mobility;Balance OT goals addressed during session: ADL's and self-care      AM-PAC OT "6 Clicks" Daily Activity     Outcome Measure   Help from another person eating meals?: Total Help from another person taking care of personal grooming?: A Lot Help from another person toileting, which includes using toliet, bedpan, or urinal?: Total Help from another person bathing (including washing, rinsing, drying)?: Total Help from another person to put on and taking off regular upper body clothing?: Total Help from another person to put on and taking off regular lower body clothing?: Total 6 Click Score: 7    End of Session Equipment Utilized During Treatment: Gait belt;Other (comment)(helmet)  OT Visit Diagnosis: Other abnormalities of gait and mobility (R26.89);Hemiplegia and hemiparesis;Cognitive communication deficit (R41.841) Symptoms and signs involving cognitive functions: Other Nontraumatic ICH Hemiplegia - Right/Left: Right Hemiplegia - caused by: Other Nontraumatic intracranial  hemorrhage   Activity Tolerance Patient tolerated treatment well   Patient Left in bed;with call bell/phone within reach;with bed alarm set;with nursing/sitter in room   Nurse Communication Mobility status;Other (comment)(splint wearing schedule)        Time: [id number] OT Time Calculation (min): 31 min  Charges: OT General Charges $OT Visit: 1 Visit OT Treatments $Self Care/Home Management : 8-22 mins $Neuromuscular Re-education: 8-22 mins  Joshua [name], MSOT, OTR/L Behavioral Health OT/ Acute Relief OT [facility] Office: [phone]    Joshua [name] 11/27/2019, 2:25 PM

## 2019-11-27 NOTE — TOC Progression Note (Signed)
Transition of Care [facility]) - Progression Note    Patient Details  Name: Joshua [name] MRN: [id number] Date of Birth: 07-Aug-1981  [facility] of [facility]) CM/SW Contact  Sharlet Salina Mila Homer, LCSW Phone Number: 11/27/2019, 1:31 PM  Clinical Narrative:  Patient is difficult to place - Medicaid pending. Information resent to facilities on 11/25 and no bed offers. Per notes, patient is not safe to discharge home at this time.     Expected Discharge Plan: Skilled Nursing Facility Barriers to Discharge: Continued Medical Work up, SNF Pending payor source - LOG  Expected Discharge Plan and Services Expected Discharge Plan: [place] In-house Referral: Clinical Social Work Discharge Planning Services: NA Post Acute Care Choice: [place] Living arrangements for the past 2 months: Apartment                 DME Arranged: N/A DME Agency: NA       HH Arranged: NA HH Agency: NA      Social Determinants of Health (SDOH) Interventions  No SDOH interventions needed at this time  Readmission Risk Interventions No flowsheet data found.

## 2019-11-27 NOTE — Progress Notes (Signed)
Physical Therapy Treatment Patient Details Name: Joshua [name] MRN: [id number] DOB: 05-Jul-1981 Today's Date: 11/27/2019    History of Present Illness Pt is a 38 y.o. 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 S/p L frontoparietal craniectomy for evacuation of L basal ganglia hematoma 9/12. ETT 9/12-9/18, 9/18-9/23. PEG tube placed 9/28 secondary to dysphagia. PMH includes alcohol abuse, seizures, HTN.    PT Comments    Patient received in bed, ongoing flat affect, non-verbal and physically resisting activity with therapy today. Required MaxAx2 for functional bed mobility, once at EOB demonstrated strong posterior push requiring MaxA to maintain upright. Perseverated on trying to take off helmet and holding onto blankets/pulling blankets over himself, very strong grip strength and unable to break grip or perseveration for participation in transfer training today. Attempted to utilize stedy, maxA for LE placement but patient kept raising feet off of pad and out of position. He was returned to supine with MaxAX2 and positioned to comfort with all needs met and RN present and attending.     Follow Up Recommendations  SNF     Equipment Recommendations  Wheelchair (measurements PT);Wheelchair cushion (measurements PT);Hospital bed;3in1 (PT)    Recommendations for Other Services       Precautions / Restrictions Precautions Precautions: Fall Precaution Comments: L crani, PEG, L hand soft mitt, helmet Required Braces or Orthoses: Splint/Cast Splint/Cast: resting hand splint and elbow splint for L UE due to contracture Restrictions Weight Bearing Restrictions: No    Mobility  Bed Mobility Overal bed mobility: Needs Assistance Bed Mobility: Supine to Sit;Sit to Supine Rolling: +2 for physical assistance;Max assist   Supine to sit: Max assist;+2 for physical assistance     General bed mobility comments: MaxAx2 due to patient not following  commands and physically resisting  Transfers                 General transfer comment: physically resisted and refused, unable to attempt today  Ambulation/Gait             General Gait Details: unable at this time   Stairs             Wheelchair Mobility    Modified Rankin (Stroke Patients Only)       Balance Overall balance assessment: Needs assistance Sitting-balance support: Feet supported;Single extremity supported Sitting balance-Leahy Scale: Poor Sitting balance - Comments: strong posterior push. Postural control: Right lateral lean                                  Cognition Arousal/Alertness: Awake/alert Behavior During Therapy: Flat affect Overall Cognitive Status: Impaired/Different from baseline Area of Impairment: Attention;Following commands                   Current Attention Level: Sustained Memory: Decreased short-term memory Following Commands: Follows one step commands inconsistently Safety/Judgement: Decreased awareness of safety;Decreased awareness of deficits Awareness: Intellectual Problem Solving: Slow processing;Decreased initiation;Difficulty sequencing General Comments: patient resistant to PT/OT, perseverating on holding onto blankets and pulling blankets over him during session      Exercises      General Comments        Pertinent Vitals/Pain Pain Assessment: Faces Faces Pain Scale: Hurts a little bit Pain Location: general discomfort. Pain Descriptors / Indicators: Guarding;Grimacing Pain Intervention(s): Limited activity within patient's tolerance;Monitored during session;Repositioned    Home Living  Prior Function            PT Goals (current goals can now be found in the care plan section) Acute Rehab PT Goals Patient Stated Goal: none stated PT Goal Formulation: Patient unable to participate in goal setting Time For Goal Achievement:  12/01/19 Potential to Achieve Goals: Fair Progress towards PT goals: Progressing toward goals    Frequency    Min 2X/week      PT Plan Current plan remains appropriate    Co-evaluation PT/OT/SLP Co-Evaluation/Treatment: Yes Reason for Co-Treatment: Complexity of the patient's impairments (multi-system involvement);Necessary to address cognition/behavior during functional activity;For patient/therapist safety;To address functional/ADL transfers PT goals addressed during session: Mobility/safety with mobility;Balance        AM-PAC PT "6 Clicks" Mobility   Outcome Measure  Help needed turning from your back to your side while in a flat bed without using bedrails?: Total Help needed moving from lying on your back to sitting on the side of a flat bed without using bedrails?: Total Help needed moving to and from a bed to a chair (including a wheelchair)?: Total Help needed standing up from a chair using your arms (e.g., wheelchair or bedside chair)?: Total Help needed to walk in hospital room?: Total Help needed climbing 3-5 steps with a railing? : Total 6 Click Score: 6    End of Session   Activity Tolerance: Patient tolerated treatment well Patient left: in bed;with call bell/phone within reach;with bed alarm set;with nursing/sitter in room Nurse Communication: Mobility status PT Visit Diagnosis: Muscle weakness (generalized) (M62.81);Difficulty in walking, not elsewhere classified (R26.2);Hemiplegia and hemiparesis Hemiplegia - Right/Left: Right Hemiplegia - dominant/non-dominant: Dominant Hemiplegia - caused by: Nontraumatic intracerebral hemorrhage     Time: 5361-4431 PT Time Calculation (min) (ACUTE ONLY): 31 min  Charges:  $Therapeutic Activity: 8-22 mins(co-tx with OT)                     Windell Norfolk, DPT, PN1   Supplemental Physical Therapist [place]    Pager [phone] Acute Rehab Office [phone]

## 2019-11-28 LAB — GLUCOSE, CAPILLARY
Glucose-Capillary: 101 mg/dL — ABNORMAL HIGH (ref 70–99)
Glucose-Capillary: 105 mg/dL — ABNORMAL HIGH (ref 70–99)
Glucose-Capillary: 115 mg/dL — ABNORMAL HIGH (ref 70–99)
Glucose-Capillary: 135 mg/dL — ABNORMAL HIGH (ref 70–99)
Glucose-Capillary: 151 mg/dL — ABNORMAL HIGH (ref 70–99)
Glucose-Capillary: 74 mg/dL (ref 70–99)

## 2019-11-28 NOTE — Progress Notes (Addendum)
PROGRESS NOTE    Joshua [name]  [id number] DOB: [birth year] DOA: 09/09/2019 PCP: Patient, [name]  Brief Narrative: 38 year old male admitted by neurology on 9/12 with seizure, right hemiplegia, decreased responsiveness found to have left hemispheric intracranial hemorrhage, subsequently had a rapidly declining neurological status, intubated and admitted to ICU, underwent left craniotomy for decompression and evacuation of [place] on 9/12 by Dr. Vertell Limber -Continues to have slow neurological recovery, prolonged hospitalization treated for seizures and aspiration pneumonia -At this time remains nonverbal, awake and alert but declines to work with SLP -Underwent PEG tube placement on 9/28 -Disposition continues to be challenging, uninsured, needs long-term placement -stable throughout this week 11/25-12/1  Assessment & Plan:   Left intracranial hemorrhage  s/p decompressive craniectomy and hematoma evacuation and implantation of skull flap in the right abdomen by Dr. Vertell Limber in 9/12 with associated seizures. -Felt to be secondary to uncontrolled, untreated hypertension, CTA was negative for aneurysm or vascular malformation -Remains aphasic/nonverbal with dense right hemiplegia , cognitive impairment, inconsistently follows commands -Status post PEG tube tolerating tube feeds -PT OT following -Continue Seroquel and PPI -Social work and case management following -no changes  Seizure -Secondary to above ICH, continue Keppra  Aspiration pneumonia Dysphagia Completed 7-day course of antibiotics.  Over a month ago S/p PEG tube for dysphagia.  SLP requested and as per SLP " Pt allowed cleaning of exterior surfaces of teeth only. Pt would not open his jaw to allow cleaning of inside of oral cavity, pushing my hand away or grabbing at suction swab and pushing it away.   -Unfortunately can continues to decline working with speech therapy  -SLP finally signed off 11/28  Essential hypertension  -Stable, continue amlodipine, decrease hydralazine dose  Severe protein calorie malnutrition -Continue tube feeds -See discussion above   h/o alcohol use  No signs of withdrawal  Stage 3 CKD:  Creatinine at baseline, around 1.5  Mild anemia of chronic disease: Hemoglobin stable    DVT prophylaxis: Lovenox Code Status: Full code Family Communication: no Family at bedside Disposition Plan: Social work, case management following, difficult placement   Consultants:   Neurosurgery  Procedures: S/p decompressive craniectomy, hematoma evacuation.  Antimicrobials: None  Subjective: -No events overnight, tolerating tube feeds, sitting comfortably in bed  Objective: Vitals:   11/27/19 1946 11/27/19 2317 11/28/19 0500 11/28/19 0753  BP: 123/86 114/89  128/89  Pulse: 85 81  86  Resp: 17 17  16   Temp: 97.6 F (36.4 C) 98.3 F (36.8 C)  97.7 F (36.5 C)  TempSrc: Oral Oral  Oral  SpO2: 100% 100%  100%  Weight:   59.3 kg   Height:        Intake/Output Summary (Last 24 hours) at 11/28/2019 1056 Last data filed at 11/27/2019 1622 Gross per 24 hour  Intake -  Output 350 ml  Net -350 ml   Filed Weights   11/25/19 0500 11/27/19 0500 11/28/19 0500  Weight: 59.9 kg 59.4 kg 59.3 kg    Examination:  Gen: Awake, Alert, nonverbal, sitting up in bed, no distress HEENT: Pupils equal reactive, neck is supple Lungs: Clear bilaterally CVS: RRR,No Gallops,Rubs or new Murmurs Abd: Soft scaphoid, nontender, bowel sounds present, PEG tube with tube feeds infusing Extremities: No edema  skin: no new rashes Central nervous system: Awake alert, nonverbal, dense right hemiplegia with spasticity, contractures Psychiatry: Unable to assess    Data Reviewed: I have personally reviewed following labs and imaging studies  CBC: Recent Labs  Lab 11/22/19  0226 11/26/19 0458  WBC 6.5 7.0  HGB 11.7* 11.7*  HCT 35.7* 36.3*  MCV 84.6 84.4  PLT 282 [id number]   Basic Metabolic Panel: Recent  Labs  Lab 11/22/19 0226 11/23/19 0318 11/26/19 0458  NA 137 137 136  K 4.2 4.1 4.3  CL 99 100 98  CO2 27 28 27   GLUCOSE 110* 117* 112*  BUN 29* 29* 27*  CREATININE 1.55* 1.50* 1.37*  CALCIUM 10.0 9.7 10.1   GFR: Estimated Creatinine Clearance: 60.7 mL/min (A) (by C-G formula based on SCr of 1.37 mg/dL (H)). Liver Function Tests: No results for input(s): AST, ALT, ALKPHOS, BILITOT, PROT, ALBUMIN in the last 168 hours. No results for input(s): LIPASE, AMYLASE in the last 168 hours. No results for input(s): AMMONIA in the last 168 hours. Coagulation Profile: No results for input(s): INR, PROTIME in the last 168 hours. Cardiac Enzymes: No results for input(s): CKTOTAL, CKMB, CKMBINDEX, TROPONINI in the last 168 hours. BNP (last 3 results) No results for input(s): PROBNP in the last 8760 hours. HbA1C: No results for input(s): HGBA1C in the last 72 hours. CBG: Recent Labs  Lab 11/27/19 1616 11/27/19 1943 11/27/19 2316 11/28/19 0344 11/28/19 0730  GLUCAP 110* 87 147* 74 105*   Lipid Profile: No results for input(s): CHOL, HDL, LDLCALC, TRIG, CHOLHDL, LDLDIRECT in the last 72 hours. Thyroid Function Tests: No results for input(s): TSH, T4TOTAL, FREET4, T3FREE, THYROIDAB in the last 72 hours. Anemia Panel: No results for input(s): VITAMINB12, FOLATE, FERRITIN, TIBC, IRON, RETICCTPCT in the last 72 hours. Sepsis Labs: No results for input(s): PROCALCITON, LATICACIDVEN in the last 168 hours.  No results found for this or any previous visit (from the past 240 hour(s)).       Radiology Studies: No results found.      Scheduled Meds: . alteplase  2 mg Intracatheter Once  . amLODipine  10 mg Per Tube Daily  . chlorhexidine  15 mL Mouth Rinse BID  . enoxaparin (LOVENOX) injection  40 mg Subcutaneous Daily  . feeding supplement (OSMOLITE 1.2 CAL)  474 mL Per Tube QID  . feeding supplement (PRO-STAT SUGAR FREE 64)  30 mL Per Tube BID  . folic acid  1 mg Per Tube Daily   . free water  300 mL Per Tube Q4H  . hydrALAZINE  10 mg Per Tube Q8H  . insulin aspart  0-15 Units Subcutaneous Q4H  . levETIRAcetam  500 mg Per Tube BID  . mouth rinse  15 mL Mouth Rinse q12n4p  . metoprolol tartrate  100 mg Per Tube BID  . multivitamin  15 mL Per Tube Daily  . pantoprazole sodium  40 mg Per Tube BID  . QUEtiapine  50 mg Per Tube QHS  . sodium chloride flush  10-40 mL Intracatheter Q12H  . thiamine  100 mg Per Tube Daily   Continuous Infusions:    LOS: 80 days   [name], MD [facility] 11/28/2019, 10:56 AM

## 2019-11-29 LAB — CBC
HCT: 36.4 % — ABNORMAL LOW (ref 39.0–52.0)
Hemoglobin: 11.5 g/dL — ABNORMAL LOW (ref 13.0–17.0)
MCH: 26.8 pg (ref 26.0–34.0)
MCHC: 31.6 g/dL (ref 30.0–36.0)
MCV: 84.8 fL (ref 80.0–100.0)
Platelets: 289 10*3/uL (ref 150–400)
RBC: 4.29 MIL/uL (ref 4.22–5.81)
RDW: 13.2 % (ref 11.5–15.5)
WBC: 5.8 10*3/uL (ref 4.0–10.5)
nRBC: 0 % (ref 0.0–0.2)

## 2019-11-29 LAB — BASIC METABOLIC PANEL
Anion gap: 13 (ref 5–15)
BUN: 34 mg/dL — ABNORMAL HIGH (ref 6–20)
CO2: 26 mmol/L (ref 22–32)
Calcium: 10.2 mg/dL (ref 8.9–10.3)
Chloride: 98 mmol/L (ref 98–111)
Creatinine, Ser: 1.4 mg/dL — ABNORMAL HIGH (ref 0.61–1.24)
GFR calc Af Amer: >60 mL/min (ref >60)
GFR calc non Af Amer: >60 mL/min (ref >60)
Glucose, Bld: 104 mg/dL — ABNORMAL HIGH (ref 70–99)
Potassium: 4.5 mmol/L (ref 3.5–5.1)
Sodium: 137 mmol/L (ref 135–145)

## 2019-11-29 LAB — GLUCOSE, CAPILLARY
Glucose-Capillary: 78 mg/dL (ref 70–99)
Glucose-Capillary: 98 mg/dL (ref 70–99)
Glucose-Capillary: 114 mg/dL — ABNORMAL HIGH (ref 70–99)
Glucose-Capillary: 94 mg/dL (ref 70–99)
Glucose-Capillary: 104 mg/dL — ABNORMAL HIGH (ref 70–99)

## 2019-11-29 NOTE — Progress Notes (Addendum)
PROGRESS NOTE    Joshua [name]  [id number] DOB: September 26, 1981 DOA: 09/09/2019 PCP: Patient, [name]  Brief Narrative:   Joshua [name] is a 38 year old male admitted by neurology on 9/12 with seizure, right hemiplegia, decreased responsiveness found to have left hemispheric intracranial hemorrhage, subsequently had a rapidly declining neurological status, intubated and admitted to ICU, underwent left craniotomy for decompression and evacuation of ICH on 9/12 by Dr. Vertell Limber. Continues to have slow neurological recovery, prolonged hospitalization treated for seizures and aspiration pneumonia.  Continues to be awake and alert, but aphasic. PEG tube placement on 9/28. Disposition continues to be challenging, uninsured, needs long-term placement  Assessment & Plan:   Left intracranial hemorrhage  Patient underwent decompressive craniectomy and hematoma evacuation with implantation of skull flap in the right abdomen by Dr. Vertell Limber in 9/12. Felt to be secondary to uncontrolled, untreated hypertension, CTA was negative for aneurysm or vascular malformation. Remains aphasic/nonverbal with dense right hemiplegia, cognitive impairment, inconsistently follows commands.  -PT/OT following -Social work/case management following  **Discussed with Dr. Vertell Limber this afternoon on 11/29/2019 regarding flap replacement; given his bedridden status and lack of progress since surgery, no indication for replacement of his skull at this time as would only be a cosmetic fix.  Seizure Secondary to above ICH, continue Keppra 500mg  BID  Aspiration pneumonia Dysphagia Sputum culture 09/11/2019 with normal oral flora.  Completed 7-day course of antibiotics during initial hospitalization. PEG tube placed for dysphagia.  Speech therapy was following and signed off on 11/24/2019 secondary to continued behaviors of refusal of speech therapy services. -Continue tube feeds per nutrition  Klebsiella oxytoca UTI -Completed course of  cefazolin  Essential hypertension -Stable, continue amlodipine, decrease hydralazine dose  Severe protein calorie malnutrition -Continue tube feeds -See discussion above  Hx alcohol use  No signs of withdrawal  Stage 3 CKD:  Creatinine at baseline, around 1.5.  Creatinine 1.4 today.  Mild anemia of chronic disease: Hemoglobin stable    DVT prophylaxis: Lovenox Code Status: Full code Family Communication: No family present at bedside this morning Disposition Plan: Social work, case management following, difficult placement   Consultants:   Neurosurgery  Neurology  Procedures: S/p decompressive craniectomy, hematoma evacuation.   Antimicrobials:  Cefazolin 9/14 - 914; 9/28 - 9/30 Vancomycin 9/14 - 9/16 Zosyn 9/14 -9/17 Unasyn 9/17 - 9/20 Cephalexin 9/30 - 10/02   Subjective: Patient seen and examined at bedside, resting comfortably.  Tolerating tube feeds.  Continues to be aphasic.  No acute events overnight per nursing staff.  Objective: Vitals:   11/28/19 1942 11/28/19 2330 11/29/19 0413 11/29/19 0732  BP: 105/78 (!) 123/94 (!) 120/99 (!) 147/99  Pulse: 92 88 80 97  Resp: 16 16 16 18   Temp: 99 F (37.2 C) 97.8 F (36.6 C) 98.8 F (37.1 C) 98.7 F (37.1 C)  TempSrc: Oral Axillary Oral Oral  SpO2: 100% 99% 100% 100%  Weight:      Height:        Intake/Output Summary (Last 24 hours) at 11/29/2019 1201 Last data filed at 11/29/2019 0913 Gross per 24 hour  Intake -  Output 2150 ml  Net -2150 ml   Filed Weights   11/25/19 0500 11/27/19 0500 11/28/19 0500  Weight: 59.9 kg 59.4 kg 59.3 kg    Examination:  Gen: Awake, Alert, nonverbal, lying in bed, no distress HEENT: Pupils equal reactive, neck is supple Lungs: Clear bilaterally CVS: RRR,No Gallops, Rubs or new Murmurs Abd: Soft scaphoid, nontender, bowel sounds present, PEG tube with  tube feeds infusing, skull appreciated and palpated right lower quadrant Extremities: No edema  skin: no new rashes  Central nervous system: Awake alert, nonverbal, dense right hemiplegia with spasticity, contractures Psychiatry: Unable to assess    Data Reviewed: I have personally reviewed following labs and imaging studies  CBC: Recent Labs  Lab 11/26/19 0458 11/29/19 0651  WBC 7.0 5.8  HGB 11.7* 11.5*  HCT 36.3* 36.4*  MCV 84.4 84.8  PLT 299 [id number]   Basic Metabolic Panel: Recent Labs  Lab 11/23/19 0318 11/26/19 0458 11/29/19 0651  NA 137 136 137  K 4.1 4.3 4.5  CL 100 98 98  CO2 28 27 26   GLUCOSE 117* 112* 104*  BUN 29* 27* 34*  CREATININE 1.50* 1.37* 1.40*  CALCIUM 9.7 10.1 10.2   GFR: Estimated Creatinine Clearance: 59.4 mL/min (A) (by C-G formula based on SCr of 1.4 mg/dL (H)). Liver Function Tests: No results for input(s): AST, ALT, ALKPHOS, BILITOT, PROT, ALBUMIN in the last 168 hours. No results for input(s): LIPASE, AMYLASE in the last 168 hours. No results for input(s): AMMONIA in the last 168 hours. Coagulation Profile: No results for input(s): INR, PROTIME in the last 168 hours. Cardiac Enzymes: No results for input(s): CKTOTAL, CKMB, CKMBINDEX, TROPONINI in the last 168 hours. BNP (last 3 results) No results for input(s): PROBNP in the last 8760 hours. HbA1C: No results for input(s): HGBA1C in the last 72 hours. CBG: Recent Labs  Lab 11/28/19 1632 11/28/19 1948 11/28/19 2333 11/29/19 0406 11/29/19 0731  GLUCAP 115* 101* 151* 78 98   Lipid Profile: No results for input(s): CHOL, HDL, LDLCALC, TRIG, CHOLHDL, LDLDIRECT in the last 72 hours. Thyroid Function Tests: No results for input(s): TSH, T4TOTAL, FREET4, T3FREE, THYROIDAB in the last 72 hours. Anemia Panel: No results for input(s): VITAMINB12, FOLATE, FERRITIN, TIBC, IRON, RETICCTPCT in the last 72 hours. Sepsis Labs: No results for input(s): PROCALCITON, LATICACIDVEN in the last 168 hours.  No results found for this or any previous visit (from the past 240 hour(s)).       Radiology Studies:  No results found.      Scheduled Meds: . alteplase  2 mg Intracatheter Once  . amLODipine  10 mg Per Tube Daily  . chlorhexidine  15 mL Mouth Rinse BID  . enoxaparin (LOVENOX) injection  40 mg Subcutaneous Daily  . feeding supplement (OSMOLITE 1.2 CAL)  474 mL Per Tube QID  . feeding supplement (PRO-STAT SUGAR FREE 64)  30 mL Per Tube BID  . folic acid  1 mg Per Tube Daily  . free water  300 mL Per Tube Q4H  . hydrALAZINE  10 mg Per Tube Q8H  . insulin aspart  0-15 Units Subcutaneous Q4H  . levETIRAcetam  500 mg Per Tube BID  . mouth rinse  15 mL Mouth Rinse q12n4p  . metoprolol tartrate  100 mg Per Tube BID  . multivitamin  15 mL Per Tube Daily  . pantoprazole sodium  40 mg Per Tube BID  . QUEtiapine  50 mg Per Tube QHS  . sodium chloride flush  10-40 mL Intracatheter Q12H  . thiamine  100 mg Per Tube Daily   Continuous Infusions:   Time spent: 28 minutes spent on chart review, discussion with nursing staff, consultants, updating family and interview/physical exam; more than 50% of that time was spent in counseling and/or coordination of care.   LOS: 68 days   Cristen Bredeson J [place] DO [facility] 11/29/2019, 12:01 PM

## 2019-11-29 NOTE — Progress Notes (Signed)
Physical Therapy Treatment Patient Details Name: Virl Coble MRN: [id number] DOB: 15-May-1981 Today's Date: 11/29/2019    History of Present Illness Pt is a 38 y.o. 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 S/p L frontoparietal craniectomy for evacuation of L basal ganglia hematoma 9/12. ETT 9/12-9/18, 9/18-9/23. PEG tube placed 9/28 secondary to dysphagia. PMH includes alcohol abuse, seizures, HTN.    PT Comments    Patient received in bed, continues with very flat affect and physical resistance to participating in therapy today. Continued to require MaxAx2 for bed mobility and MaxA to maintain upright due to posterior pushing; continues to grab and strongly hold bed rails, blankets, and edge of mattress. Sat for approximately 8 minutes with balance improving to MinA. Attempted to engage him in basic tasks but only able to get reaction to localized stimuli like discomfort from helmet strap. Did not engage in activities otherwise today. Began to attempt sit to stand, then patient became more resistive and pulled legs back into bed/laid himself back down. He was left in bed with all needs met, bed alarm active this morning.     Follow Up Recommendations  SNF     Equipment Recommendations  Wheelchair (measurements PT);Wheelchair cushion (measurements PT);Hospital bed;3in1 (PT)    Recommendations for Other Services       Precautions / Restrictions Precautions Precautions: Fall Precaution Comments: L crani, PEG, L hand soft mitt, helmet Required Braces or Orthoses: Splint/Cast Splint/Cast: resting hand splint and elbow splint for R UE due synergistic pattern Restrictions Weight Bearing Restrictions: No    Mobility  Bed Mobility Overal bed mobility: Needs Assistance Bed Mobility: Supine to Sit;Sit to Supine     Supine to sit: Max assist;+2 for physical assistance Sit to supine: Max assist;+2 for physical assistance   General bed  mobility comments: MaxAx2 due to patient not following commands and physically resisting  Transfers                 General transfer comment: began to attempt then became very physically resistant and pulled legs back into bed  Ambulation/Gait             General Gait Details: unable at this time   Stairs             Wheelchair Mobility    Modified Rankin (Stroke Patients Only)       Balance Overall balance assessment: Needs assistance Sitting-balance support: Feet supported;Single extremity supported Sitting balance-Leahy Scale: Poor Sitting balance - Comments: strong posterior push. Postural control: Right lateral lean                                  Cognition Arousal/Alertness: Awake/alert Behavior During Therapy: Flat affect;Restless Overall Cognitive Status: Impaired/Different from baseline Area of Impairment: Attention;Following commands;Safety/judgement;Awareness;Problem solving                   Current Attention Level: Sustained Memory: Decreased short-term memory Following Commands: Follows one step commands inconsistently Safety/Judgement: Decreased awareness of safety;Decreased awareness of deficits Awareness: Intellectual Problem Solving: Slow processing;Decreased initiation;Difficulty sequencing;Requires verbal cues;Requires tactile cues General Comments: physically resistant and perseverating on holding onto blankets and bed rails      Exercises      General Comments        Pertinent Vitals/Pain Pain Assessment: Faces Faces Pain Scale: Hurts a little bit Pain Location: general discomfort with mobility  Pain Descriptors / Indicators: Guarding Pain Intervention(s): Limited activity within patient's tolerance;Monitored during session;Repositioned    Home Living                      Prior Function            PT Goals (current goals can now be found in the care plan section) Acute Rehab PT  Goals Time For Goal Achievement: 12/13/19 Potential to Achieve Goals: Fair Progress towards PT goals: Not progressing toward goals - comment(limited by physical resistance to interventions)    Frequency    Min 2X/week      PT Plan Current plan remains appropriate    Co-evaluation              AM-PAC PT "6 Clicks" Mobility   Outcome Measure  Help needed turning from your back to your side while in a flat bed without using bedrails?: Total Help needed moving from lying on your back to sitting on the side of a flat bed without using bedrails?: Total Help needed moving to and from a bed to a chair (including a wheelchair)?: Total Help needed standing up from a chair using your arms (e.g., wheelchair or bedside chair)?: Total Help needed to walk in hospital room?: Total Help needed climbing 3-5 steps with a railing? : Total 6 Click Score: 6    End of Session   Activity Tolerance: Patient tolerated treatment well Patient left: in bed;with call bell/phone within reach;with bed alarm set   PT Visit Diagnosis: Muscle weakness (generalized) (M62.81);Difficulty in walking, not elsewhere classified (R26.2);Hemiplegia and hemiparesis Hemiplegia - Right/Left: Right Hemiplegia - dominant/non-dominant: Dominant Hemiplegia - caused by: Nontraumatic intracerebral hemorrhage     Time: 1040-1055 PT Time Calculation (min) (ACUTE ONLY): 15 min  Charges:  $Therapeutic Activity: 8-22 mins                     Windell Norfolk, DPT, PN1   Supplemental Physical Therapist [place]    Pager [phone] Acute Rehab Office [phone]

## 2019-11-30 LAB — GLUCOSE, CAPILLARY
Glucose-Capillary: 101 mg/dL — ABNORMAL HIGH (ref 70–99)
Glucose-Capillary: 113 mg/dL — ABNORMAL HIGH (ref 70–99)
Glucose-Capillary: 121 mg/dL — ABNORMAL HIGH (ref 70–99)
Glucose-Capillary: 143 mg/dL — ABNORMAL HIGH (ref 70–99)
Glucose-Capillary: 150 mg/dL — ABNORMAL HIGH (ref 70–99)
Glucose-Capillary: 79 mg/dL (ref 70–99)
Glucose-Capillary: 96 mg/dL (ref 70–99)

## 2019-11-30 NOTE — Progress Notes (Signed)
PROGRESS NOTE    Joshua [name]  [id number] DOB: 04-Dec-1981 DOA: 09/09/2019 PCP: Patient, [name]     Brief Narrative:  Joshua [name] is a 38 year old male admitted by neurology on 9/12 with seizure, right hemiplegia, decreased responsiveness found to have left hemispheric intracranial hemorrhage, subsequently had a rapidly declining neurological status, intubated and admitted to ICU, underwent left craniotomy for decompression and evacuation of ICH on 9/12 by Dr. Vertell Limber. Continues to have slow neurological recovery, prolonged hospitalization treated for seizures and aspiration pneumonia.  Continues to be awake and alert, but aphasic. PEG tube placement on 9/28. Disposition continues to be challenging, uninsured, needs long-term placement.  New events last 24 hours / Subjective: No new events   Assessment & Plan:   Active Problems:   ICH (intracerebral hemorrhage) (HCC)   Subdural hematoma (HCC)   Intracerebral hemorrhage (HCC)   Cytotoxic brain edema (HCC)   Hemorrhagic stroke (HCC)   Acute respiratory failure ([place])   Essential hypertension   Severe protein-calorie malnutrition ([place])   CKD (chronic kidney disease), stage IV (HCC)   Seizure (HCC)    Left intracranial hemorrhage  Patient underwent decompressive craniectomy and hematoma evacuation with implantation of skull flap in the right abdomen by Dr. Vertell Limber in 9/12. Felt to be secondary to uncontrolled, untreated hypertension, CTA was negative for aneurysm or vascular malformation. Remains aphasic/nonverbal with dense right hemiplegia, cognitive impairment, inconsistently follows commands. Previous hospitalist discussed with Dr. Vertell Limber 12/2 regarding flap replacement; given his bedridden status and lack of progress since surgery, no indication for replacement of his skull at this time as would only be a cosmetic fix -PT/OT following -Social work/case management following  Seizure Secondary to above ICH, continue Keppra  500mg  BID  Aspiration pneumonia Dysphagia Sputum culture 09/11/2019 with normal oral flora.  Completed 7-day course of antibiotics during initial hospitalization. PEG tube placed for dysphagia.  Speech therapy was following and signed off on 11/24/2019 secondary to continued behaviors of refusal of speech therapy services. -Continue tube feeds per nutrition  Klebsiella oxytoca UTI -Completed course of cefazolin  Essential hypertension -Stable, continue amlodipine, hydralazine, lopressor   Severe protein calorie malnutrition -Continue tube feeds  Stage 2 CKD:  Creatinine at baseline, around 1.5. Check BMP intermittently.   Mild anemia of chronic disease: Hemoglobin stable. Check CBC intermittently.    DVT prophylaxis: Lovenox Code Status: Full Family Communication: None at bedside Disposition Plan: Difficult placement     Antimicrobials:  Anti-infectives (From admission, onward)   Start     Dose/Rate Route Frequency Ordered Stop   09/27/19 1130  cephALEXin (KEFLEX) 250 MG/5ML suspension 500 mg     500 mg Per Tube Every 12 hours 09/27/19 1052 09/29/19 2250   09/25/19 1445  ceFAZolin (ANCEF) IVPB 2g/100 mL premix  Status:  Discontinued     2 g 200 mL/hr over 30 Minutes Intravenous Every 12 hours 09/25/19 1417 09/27/19 1052   09/14/19 1000  Ampicillin-Sulbactam (UNASYN) 3 g in sodium chloride 0.9 % 100 mL IVPB     3 g 200 mL/hr over 30 Minutes Intravenous Every 8 hours 09/14/19 0922 09/17/19 1816   09/13/19 1100  vancomycin (VANCOCIN) IVPB 750 mg/150 ml premix  Status:  Discontinued     750 mg 150 mL/hr over 60 Minutes Intravenous Every 24 hours 09/13/19 0728 09/13/19 0838   09/12/19 1100  vancomycin (VANCOCIN) IVPB 1000 mg/200 mL premix  Status:  Discontinued     1,000 mg 200 mL/hr over 60 Minutes Intravenous Every 24 hours  09/11/19 1034 09/13/19 0728   09/11/19 1130  piperacillin-tazobactam (ZOSYN) IVPB 3.375 g  Status:  Discontinued     3.375 g 12.5 mL/hr over 240  Minutes Intravenous Every 8 hours 09/11/19 1054 09/14/19 0845   09/11/19 1100  vancomycin (VANCOCIN) 1,500 mg in sodium chloride 0.9 % 500 mL IVPB     1,500 mg 250 mL/hr over 120 Minutes Intravenous  Once 09/11/19 1034 09/11/19 1900   09/11/19 1015  piperacillin-tazobactam (ZOSYN) IVPB 4.5 g  Status:  Discontinued     4.5 g 200 mL/hr over 30 Minutes Intravenous Every 8 hours 09/11/19 1010 09/11/19 1052   09/09/19 0845  ceFAZolin (ANCEF) IVPB 2g/100 mL premix     2 g 200 mL/hr over 30 Minutes Intravenous Every 8 hours 09/09/19 0842 09/09/19 1658        Objective: Vitals:   11/29/19 1929 11/30/19 0002 11/30/19 0322 11/30/19 0734  BP: 106/80 116/81 (!) 118/93 (!) 119/93  Pulse: 100 99 97 89  Resp: 18 18 18 15   Temp: 98.6 F (37 C) 98.3 F (36.8 C) 98.1 F (36.7 C) 97.8 F (36.6 C)  TempSrc: Oral Oral Oral Axillary  SpO2: 100% 100% 100% 100%  Weight:   63.1 kg   Height:        Intake/Output Summary (Last 24 hours) at 11/30/2019 1007 Last data filed at 11/30/2019 0926 Gross per 24 hour  Intake -  Output 1950 ml  Net -1950 ml   Filed Weights   11/27/19 0500 11/28/19 0500 11/30/19 0322  Weight: 59.4 kg 59.3 kg 63.1 kg    Examination:  General exam: Appears calm and comfortable  Respiratory system: Clear to auscultation. Respiratory effort normal. No respiratory distress.  Cardiovascular system: S1 & S2 heard, RRR. No murmurs. No pedal edema. Gastrointestinal system: Abdomen is nondistended, soft and nontender.  Central nervous system: Alert   Extremities: Symmetric in appearance   Data Reviewed: I have personally reviewed following labs and imaging studies  CBC: Recent Labs  Lab 11/26/19 0458 11/29/19 0651  WBC 7.0 5.8  HGB 11.7* 11.5*  HCT 36.3* 36.4*  MCV 84.4 84.8  PLT 299 [id number]   Basic Metabolic Panel: Recent Labs  Lab 11/26/19 0458 11/29/19 0651  NA 136 137  K 4.3 4.5  CL 98 98  CO2 27 26  GLUCOSE 112* 104*  BUN 27* 34*  CREATININE 1.37* 1.40*   CALCIUM 10.1 10.2   GFR: Estimated Creatinine Clearance: 63.2 mL/min (A) (by C-G formula based on SCr of 1.4 mg/dL (H)). Liver Function Tests: No results for input(s): AST, ALT, ALKPHOS, BILITOT, PROT, ALBUMIN in the last 168 hours. No results for input(s): LIPASE, AMYLASE in the last 168 hours. No results for input(s): AMMONIA in the last 168 hours. Coagulation Profile: No results for input(s): INR, PROTIME in the last 168 hours. Cardiac Enzymes: No results for input(s): CKTOTAL, CKMB, CKMBINDEX, TROPONINI in the last 168 hours. BNP (last 3 results) No results for input(s): PROBNP in the last 8760 hours. HbA1C: No results for input(s): HGBA1C in the last 72 hours. CBG: Recent Labs  Lab 11/29/19 1633 11/29/19 1927 11/30/19 0001 11/30/19 0328 11/30/19 0833  GLUCAP 94 104* 150* 79 101*   Lipid Profile: No results for input(s): CHOL, HDL, LDLCALC, TRIG, CHOLHDL, LDLDIRECT in the last 72 hours. Thyroid Function Tests: No results for input(s): TSH, T4TOTAL, FREET4, T3FREE, THYROIDAB in the last 72 hours. Anemia Panel: No results for input(s): VITAMINB12, FOLATE, FERRITIN, TIBC, IRON, RETICCTPCT in the last 72 hours.  Sepsis Labs: No results for input(s): PROCALCITON, LATICACIDVEN in the last 168 hours.  No results found for this or any previous visit (from the past 240 hour(s)).    Radiology Studies: No results found.    Scheduled Meds: . alteplase  2 mg Intracatheter Once  . amLODipine  10 mg Per Tube Daily  . chlorhexidine  15 mL Mouth Rinse BID  . enoxaparin (LOVENOX) injection  40 mg Subcutaneous Daily  . feeding supplement (OSMOLITE 1.2 CAL)  474 mL Per Tube QID  . feeding supplement (PRO-STAT SUGAR FREE 64)  30 mL Per Tube BID  . folic acid  1 mg Per Tube Daily  . free water  300 mL Per Tube Q4H  . hydrALAZINE  10 mg Per Tube Q8H  . insulin aspart  0-15 Units Subcutaneous Q4H  . levETIRAcetam  500 mg Per Tube BID  . mouth rinse  15 mL Mouth Rinse q12n4p  .  metoprolol tartrate  100 mg Per Tube BID  . multivitamin  15 mL Per Tube Daily  . pantoprazole sodium  40 mg Per Tube BID  . QUEtiapine  50 mg Per Tube QHS  . sodium chloride flush  10-40 mL Intracatheter Q12H  . thiamine  100 mg Per Tube Daily   Continuous Infusions:   LOS: 82 days      Time spent: 35 minutes   Dessa Phi, DO [facility] 11/30/2019, 10:07 AM   Available via Epic secure chat 7am-7pm After these hours, please refer to coverage provider listed on [email or web address]

## 2019-12-01 LAB — GLUCOSE, CAPILLARY
Glucose-Capillary: 124 mg/dL — ABNORMAL HIGH (ref 70–99)
Glucose-Capillary: 125 mg/dL — ABNORMAL HIGH (ref 70–99)
Glucose-Capillary: 132 mg/dL — ABNORMAL HIGH (ref 70–99)
Glucose-Capillary: 170 mg/dL — ABNORMAL HIGH (ref 70–99)
Glucose-Capillary: 93 mg/dL (ref 70–99)
Glucose-Capillary: 98 mg/dL (ref 70–99)

## 2019-12-01 NOTE — Progress Notes (Signed)
PROGRESS NOTE    Joshua [name]  [id number] DOB: 1981/05/20 DOA: 09/09/2019 PCP: Patient, [name]     Brief Narrative:  Joshua [name] is a 38 year old male admitted by neurology on 9/12 with seizure, right hemiplegia, decreased responsiveness found to have left hemispheric intracranial hemorrhage, subsequently had a rapidly declining neurological status, intubated and admitted to ICU, underwent left craniotomy for decompression and evacuation of ICH on 9/12 by Dr. Vertell Limber. Continues to have slow neurological recovery, prolonged hospitalization treated for seizures and aspiration pneumonia.  Continues to be awake and alert, but aphasic. PEG tube placement on 9/28. Disposition continues to be challenging, uninsured, needs long-term placement.  New events last 24 hours / Subjective: Remains aphasic, no acute events  Assessment & Plan:   Active Problems:   ICH (intracerebral hemorrhage) (HCC)   Subdural hematoma (HCC)   Intracerebral hemorrhage (HCC)   Cytotoxic brain edema (HCC)   Hemorrhagic stroke (HCC)   Acute respiratory failure ([place])   Essential hypertension   Severe protein-calorie malnutrition ([place])   CKD (chronic kidney disease), stage IV (HCC)   Seizure (HCC)    Left intracranial hemorrhage  Patient underwent decompressive craniectomy and hematoma evacuation with implantation of skull flap in the right abdomen by Dr. Vertell Limber in 9/12. Felt to be secondary to uncontrolled, untreated hypertension, CTA was negative for aneurysm or vascular malformation. Remains aphasic/nonverbal with dense right hemiplegia, cognitive impairment, inconsistently follows commands. Previous hospitalist discussed with Dr. Vertell Limber 12/2 regarding flap replacement; given his bedridden status and lack of progress since surgery, no indication for replacement of his skull at this time as would only be a cosmetic fix -PT/OT following -Social work/case management following  Seizure Secondary to above ICH,  continue Keppra 500mg  BID  Aspiration pneumonia Dysphagia Sputum culture 09/11/2019 with normal oral flora.  Completed 7-day course of antibiotics during initial hospitalization. PEG tube placed for dysphagia.  Speech therapy was following and signed off on 11/24/2019 secondary to continued behaviors of refusal of speech therapy services. -Continue tube feeds per nutrition  Klebsiella oxytoca UTI -Completed course of cefazolin  Essential hypertension -Stable, continue amlodipine, hydralazine, lopressor   Severe protein calorie malnutrition -Continue tube feeds  Stage 2 CKD:  Creatinine at baseline, around 1.5. Check BMP intermittently.   Mild anemia of chronic disease: Hemoglobin stable. Check CBC intermittently.    DVT prophylaxis: Lovenox Code Status: Full Family Communication: None at bedside Disposition Plan: Difficult placement     Antimicrobials:  Anti-infectives (From admission, onward)   Start     Dose/Rate Route Frequency Ordered Stop   09/27/19 1130  cephALEXin (KEFLEX) 250 MG/5ML suspension 500 mg     500 mg Per Tube Every 12 hours 09/27/19 1052 09/29/19 2250   09/25/19 1445  ceFAZolin (ANCEF) IVPB 2g/100 mL premix  Status:  Discontinued     2 g 200 mL/hr over 30 Minutes Intravenous Every 12 hours 09/25/19 1417 09/27/19 1052   09/14/19 1000  Ampicillin-Sulbactam (UNASYN) 3 g in sodium chloride 0.9 % 100 mL IVPB     3 g 200 mL/hr over 30 Minutes Intravenous Every 8 hours 09/14/19 0922 09/17/19 1816   09/13/19 1100  vancomycin (VANCOCIN) IVPB 750 mg/150 ml premix  Status:  Discontinued     750 mg 150 mL/hr over 60 Minutes Intravenous Every 24 hours 09/13/19 0728 09/13/19 0838   09/12/19 1100  vancomycin (VANCOCIN) IVPB 1000 mg/200 mL premix  Status:  Discontinued     1,000 mg 200 mL/hr over 60 Minutes Intravenous Every 24  hours 09/11/19 1034 09/13/19 0728   09/11/19 1130  piperacillin-tazobactam (ZOSYN) IVPB 3.375 g  Status:  Discontinued     3.375 g  12.5 mL/hr over 240 Minutes Intravenous Every 8 hours 09/11/19 1054 09/14/19 0845   09/11/19 1100  vancomycin (VANCOCIN) 1,500 mg in sodium chloride 0.9 % 500 mL IVPB     1,500 mg 250 mL/hr over 120 Minutes Intravenous  Once 09/11/19 1034 09/11/19 1900   09/11/19 1015  piperacillin-tazobactam (ZOSYN) IVPB 4.5 g  Status:  Discontinued     4.5 g 200 mL/hr over 30 Minutes Intravenous Every 8 hours 09/11/19 1010 09/11/19 1052   09/09/19 0845  ceFAZolin (ANCEF) IVPB 2g/100 mL premix     2 g 200 mL/hr over 30 Minutes Intravenous Every 8 hours 09/09/19 0842 09/09/19 1658       Objective: Vitals:   11/30/19 2100 11/30/19 2348 12/01/19 0400 12/01/19 0746  BP: (!) 139/100 121/87 115/90 (!) 124/94  Pulse:  (!) 103 92 97  Resp:  18 18 18   Temp:  98.3 F (36.8 C) 97.9 F (36.6 C) 98.5 F (36.9 C)  TempSrc:  Oral Oral Oral  SpO2:  99% 93% 100%  Weight:      Height:        Intake/Output Summary (Last 24 hours) at 12/01/2019 1039 Last data filed at 12/01/2019 0422 Gross per 24 hour  Intake 775 ml  Output 1600 ml  Net -825 ml   Filed Weights   11/27/19 0500 11/28/19 0500 11/30/19 0322  Weight: 59.4 kg 59.3 kg 63.1 kg    Examination: General exam: Appears calm and comfortable  Respiratory system: Clear to auscultation. Respiratory effort normal. Cardiovascular system: S1 & S2 heard, RRR. No pedal edema. Gastrointestinal system: Abdomen is nondistended, soft and nontender. Normal bowel sounds heard. Central nervous system: Alert  Extremities: Symmetric in appearance bilaterally  Skin: No rashes, lesions or ulcers on exposed skin     Data Reviewed: I have personally reviewed following labs and imaging studies  CBC: Recent Labs  Lab 11/26/19 0458 11/29/19 0651  WBC 7.0 5.8  HGB 11.7* 11.5*  HCT 36.3* 36.4*  MCV 84.4 84.8  PLT 299 [id number]   Basic Metabolic Panel: Recent Labs  Lab 11/26/19 0458 11/29/19 0651  NA 136 137  K 4.3 4.5  CL 98 98  CO2 27 26  GLUCOSE 112* 104*   BUN 27* 34*  CREATININE 1.37* 1.40*  CALCIUM 10.1 10.2   GFR: Estimated Creatinine Clearance: 63.2 mL/min (A) (by C-G formula based on SCr of 1.4 mg/dL (H)). Liver Function Tests: No results for input(s): AST, ALT, ALKPHOS, BILITOT, PROT, ALBUMIN in the last 168 hours. No results for input(s): LIPASE, AMYLASE in the last 168 hours. No results for input(s): AMMONIA in the last 168 hours. Coagulation Profile: No results for input(s): INR, PROTIME in the last 168 hours. Cardiac Enzymes: No results for input(s): CKTOTAL, CKMB, CKMBINDEX, TROPONINI in the last 168 hours. BNP (last 3 results) No results for input(s): PROBNP in the last 8760 hours. HbA1C: No results for input(s): HGBA1C in the last 72 hours. CBG: Recent Labs  Lab 11/30/19 1602 11/30/19 1934 11/30/19 2351 12/01/19 0421 12/01/19 0745  GLUCAP 113* 96 143* 93 98   Lipid Profile: No results for input(s): CHOL, HDL, LDLCALC, TRIG, CHOLHDL, LDLDIRECT in the last 72 hours. Thyroid Function Tests: No results for input(s): TSH, T4TOTAL, FREET4, T3FREE, THYROIDAB in the last 72 hours. Anemia Panel: No results for input(s): VITAMINB12, FOLATE, FERRITIN, TIBC, IRON,  RETICCTPCT in the last 72 hours. Sepsis Labs: No results for input(s): PROCALCITON, LATICACIDVEN in the last 168 hours.  No results found for this or any previous visit (from the past 240 hour(s)).    Radiology Studies: No results found.    Scheduled Meds: . amLODipine  10 mg Per Tube Daily  . chlorhexidine  15 mL Mouth Rinse BID  . enoxaparin (LOVENOX) injection  40 mg Subcutaneous Daily  . feeding supplement (OSMOLITE 1.2 CAL)  474 mL Per Tube QID  . feeding supplement (PRO-STAT SUGAR FREE 64)  30 mL Per Tube BID  . folic acid  1 mg Per Tube Daily  . free water  300 mL Per Tube Q4H  . hydrALAZINE  10 mg Per Tube Q8H  . insulin aspart  0-15 Units Subcutaneous Q4H  . levETIRAcetam  500 mg Per Tube BID  . mouth rinse  15 mL Mouth Rinse q12n4p  .  metoprolol tartrate  100 mg Per Tube BID  . multivitamin  15 mL Per Tube Daily  . pantoprazole sodium  40 mg Per Tube BID  . QUEtiapine  50 mg Per Tube QHS  . sodium chloride flush  10-40 mL Intracatheter Q12H  . thiamine  100 mg Per Tube Daily   Continuous Infusions:   LOS: 83 days      Time spent: 20 minutes   Dessa Phi, DO [facility] 12/01/2019, 10:39 AM   Available via Epic secure chat 7am-7pm After these hours, please refer to coverage provider listed on [email or web address]

## 2019-12-01 NOTE — Progress Notes (Signed)
Occupational Therapy Treatment Patient Details Name: Joshua [name] MRN: [id number] DOB: Aug 31, 1981 Today's Date: 12/01/2019    History of present illness Pt is a 38 y.o. 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 S/p L frontoparietal craniectomy for evacuation of L basal ganglia hematoma 9/12. ETT 9/12-9/18, 9/18-9/23. PEG tube placed 9/28 secondary to dysphagia. PMH includes alcohol abuse, seizures, HTN.   OT comments  Pt. Seen with PT for skilled treatment session. Continued focus on sitting balance and functional use of extremities to aide in balance and transfers to w/c.   Follow Up Recommendations  SNF;Supervision/Assistance - 24 hour    Equipment Recommendations  None recommended by OT    Recommendations for Other Services Rehab consult    Precautions / Restrictions Precautions Precautions: Fall Precaution Comments: L crani, PEG, L hand soft mitt, helmet Required Braces or Orthoses: Splint/Cast Splint/Cast: resting hand splint and elbow splint for R UE due synergistic pattern Restrictions Weight Bearing Restrictions: No       Mobility Bed Mobility Overal bed mobility: Needs Assistance   Rolling: Max assist   Supine to sit: Max assist;+2 for physical assistance     General bed mobility comments: MaxAx2 due to patient not following commands and physically resisting  Transfers                      Balance   Sitting-balance support: Feet supported;Single extremity supported Sitting balance-Leahy Scale: Poor                                     ADL either performed or assessed with clinical judgement   ADL                                         General ADL Comments: sitting eob.  encouragement for use of L UE for support in a functional way vs. pushing.  reached L UE/hand to face to scratch/rub eye.  support required when weight shifting to R side to mobilize R shoulder and  forearm positioning     Vision       Perception     Praxis      Cognition Arousal/Alertness: Awake/alert Behavior During Therapy: Flat affect;Restless Following one step commands for LUE placment inconsistently and with increased time.  Requires hand over hand assistance for desired placement and unable to maintain for greater than 5 seconds before pushing again.                                             Exercises     Shoulder Instructions       General Comments      Pertinent Vitals/ Pain       Pain Assessment: Faces Faces Pain Scale: Hurts a little bit Pain Location: general discomfort with mobility, noted also during head and neck movements/stretching Pain Descriptors / Indicators: Guarding Pain Intervention(s): Limited activity within patient's tolerance;Monitored during session;Repositioned  Home Living  Prior Functioning/Environment              Frequency  Min 2X/week        Progress Toward Goals  OT Goals(current goals can now be found in the care plan section)        Plan Discharge plan remains appropriate    Co-evaluation    PT/OT/SLP Co-Evaluation/Treatment: Yes Reason for Co-Treatment: Complexity of the patient's impairments (multi-system involvement);For patient/therapist safety;To address functional/ADL transfers   OT goals addressed during session: Proper use of Adaptive equipment and DME;ADL's and self-care      AM-PAC OT "6 Clicks" Daily Activity     Outcome Measure   Help from another person eating meals?: Total Help from another person taking care of personal grooming?: A Lot Help from another person toileting, which includes using toliet, bedpan, or urinal?: Total Help from another person bathing (including washing, rinsing, drying)?: Total Help from another person to put on and taking off regular upper body clothing?: Total Help from another  person to put on and taking off regular lower body clothing?: Total 6 Click Score: 7    End of Session    OT Visit Diagnosis: Other abnormalities of gait and mobility (R26.89);Hemiplegia and hemiparesis;Cognitive communication deficit (R41.841) Symptoms and signs involving cognitive functions: Other Nontraumatic ICH Hemiplegia - Right/Left: Right Hemiplegia - caused by: Other Nontraumatic intracranial hemorrhage   Activity Tolerance Patient tolerated treatment well   Patient Left in chair   Nurse Communication          Time: [id number] OT Time Calculation (min): 18 min  Charges: OT General Charges $OT Visit: 1 Visit OT Treatments $Self Care/Home Management : 8-22 mins   Janice Coffin, COTA/L 12/01/2019, 11:54 AM

## 2019-12-01 NOTE — Progress Notes (Addendum)
Subjective: Patient reports (aphasic)  Objective: Vital signs in last 24 hours: Temp:  [97.6 F (36.4 C)-98.7 F (37.1 C)] 98.5 F (36.9 C) (12/04 0746) Pulse Rate:  [82-103] 97 (12/04 0746) Resp:  [18] 18 (12/04 0746) BP: (100-139)/(86-100) 124/94 (12/04 0746) SpO2:  [93 %-100 %] 100 % (12/04 0746)  Intake/Output from previous day: 12/03 0701 - 12/04 0700 In: 775 [NG/GT:775] Out: 2350 [Urine:2350] Intake/Output this shift: No intake/output data recorded.  Awake, alert, but does not follow commands for me. Guards right hand with his left. Nursing reports periodic aggitation. No change neurologically from my last visit in October.  Scalp well-healed. Abdominal binder in place.   Lab Results: Recent Labs    11/29/19 0651  WBC 5.8  HGB 11.5*  HCT 36.4*  PLT 289   BMET Recent Labs    11/29/19 0651  NA 137  K 4.5  CL 98  CO2 26  GLUCOSE 104*  BUN 34*  CREATININE 1.40*  CALCIUM 10.2    Studies/Results: No results found.  Assessment/Plan: Stable  LOS: 83 days  Family had asked about when skull flap should be replaced. (Family not present at this time.) Dr. Vertell Limber does not recommend surgery for flap replacement given pt's current state, requiring total care. If/when he becomes more mobile, flap replacement from abdomen will remain an option.    [name] 12/01/2019, 8:11 AM  Please call me if I can be of further assistance, otherwise, we will sign off.

## 2019-12-01 NOTE — Progress Notes (Signed)
Physical Therapy Treatment Patient Details Name: Joshua [name] MRN: [id number] DOB: 1981-03-05 Today's Date: 12/01/2019    History of Present Illness Pt is a 38 y.o. 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 S/p L frontoparietal craniectomy for evacuation of L basal ganglia hematoma 9/12. ETT 9/12-9/18, 9/18-9/23. PEG tube placed 9/28 secondary to dysphagia. PMH includes alcohol abuse, seizures, HTN.    PT Comments    Patient more cooperative and automatic with attempts to get to w/c though with pushing with L UE unsafe without 2 assist.  Was upright in good position in chair and attempted to let him sit up at nursing station, but he was soiled in chair with feces so returned via maximove lift and assist for hygiene with NT.  Patient responds well to stretching and seems more interactive with time.  Held ornament from the Christmas tree and manipulated in L hand, but would not hold the toothbrush or let me assist with oral hygiene. Remains appropriate for SNF.  PT to follow.    Follow Up Recommendations  SNF     Equipment Recommendations  Wheelchair (measurements PT);Wheelchair cushion (measurements PT);Hospital bed;3in1 (PT)    Recommendations for Other Services       Precautions / Restrictions Precautions Precautions: Fall Precaution Comments: L crani, PEG, L hand soft mitt, helmet Required Braces or Orthoses: Splint/Cast Splint/Cast: resting hand splint and elbow splint for R UE due synergistic pattern    Mobility  Bed Mobility Overal bed mobility: Needs Assistance Bed Mobility: Supine to Sit;Sit to Supine;Rolling Rolling: Max assist   Supine to sit: Max assist;+2 for physical assistance Sit to supine: Total assist   General bed mobility comments: MaxAx2 due to patient not following commands and physically resisting  Transfers Overall transfer level: Needs assistance   Transfers: Stand Pivot Transfers   Stand pivot  transfers: Mod assist;+2 physical assistance       General transfer comment: patient reaching to opposite arm of chair and attempting to transfer but R leg extended and chair moving due to pt pushing with L arm so needed assist to keep chair close and to pivot on his feet breaking pushing tendency of L UE; after pushed in chair around unit used maximove to return to supine due to pt soiled with feces in chair  Ambulation/Gait                 Theme park manager mobility: Yes Distance: 200' Wheelchair Assistance Details (indicate cue type and reason): total A in tilt in space w/c  Modified Rankin (Stroke Patients Only) Modified Rankin (Stroke Patients Only) Pre-Morbid Rankin Score: No symptoms Modified Rankin: Severe disability     Balance Overall balance assessment: Needs assistance Sitting-balance support: Feet supported;Single extremity supported Sitting balance-Leahy Scale: Poor Sitting balance - Comments: working on trunk rotation, lateral flexion and cervical PROM in sitting, able to sit under supervision short time after stretching                                    Cognition Arousal/Alertness: Awake/alert Behavior During Therapy: Flat affect;Restless Overall Cognitive Status: Impaired/Different from baseline Area of Impairment: Attention;Following commands;Safety/judgement;Awareness;Problem solving                   Current Attention Level: Sustained   Following Commands:  Follows one step commands inconsistently Safety/Judgement: Decreased awareness of safety;Decreased awareness of deficits            Exercises Other Exercises Other Exercises: general stretching to R LE for hip flexors, adductors and traction performed to enhance extension    General Comments        Pertinent Vitals/Pain Pain Assessment: Faces Faces Pain Scale: Hurts a little bit Pain Location: general  discomfort with mobility, noted also during head and neck movements/stretching Pain Descriptors / Indicators: Guarding Pain Intervention(s): Monitored during session;Repositioned    Home Living                      Prior Function            PT Goals (current goals can now be found in the care plan section) Progress towards PT goals: Progressing toward goals    Frequency    Min 2X/week      PT Plan Current plan remains appropriate    Co-evaluation PT/OT/SLP Co-Evaluation/Treatment: Yes Reason for Co-Treatment: Complexity of the patient's impairments (multi-system involvement);Necessary to address cognition/behavior during functional activity;For patient/therapist safety PT goals addressed during session: Mobility/safety with mobility;Balance;Strengthening/ROM OT goals addressed during session: Proper use of Adaptive equipment and DME;ADL's and self-care      AM-PAC PT "6 Clicks" Mobility   Outcome Measure  Help needed turning from your back to your side while in a flat bed without using bedrails?: Total Help needed moving from lying on your back to sitting on the side of a flat bed without using bedrails?: Total Help needed moving to and from a bed to a chair (including a wheelchair)?: Total Help needed standing up from a chair using your arms (e.g., wheelchair or bedside chair)?: Total Help needed to walk in hospital room?: Total Help needed climbing 3-5 steps with a railing? : Total 6 Click Score: 6    End of Session   Activity Tolerance: Patient tolerated treatment well Patient left: in bed;with call bell/phone within reach;with nursing/sitter in room   PT Visit Diagnosis: Muscle weakness (generalized) (M62.81);Hemiplegia and hemiparesis;Other abnormalities of gait and mobility (R26.89) Hemiplegia - Right/Left: Right Hemiplegia - dominant/non-dominant: Dominant Hemiplegia - caused by: Nontraumatic intracerebral hemorrhage     Time: 1005-1100 PT Time  Calculation (min) (ACUTE ONLY): 55 min  Charges:  $Therapeutic Activity: 38-52 mins                     [name], [place] [facility] [phone] 12/01/2019    [name] 12/01/2019, 3:02 PM

## 2019-12-01 NOTE — Progress Notes (Signed)
Nutrition Follow-up  DOCUMENTATION CODES:   Severe malnutrition in context of acute illness/injury  INTERVENTION:  Continue bolus tube feeds using Osmolite 1.2 formula via PEG at goal volume of 474 ml (2 cartons/ARCs) given QID.   Provide 30 ml Prostat BID per tube.   Free water flushes of 300 ml every 4 hours per tube. (MD to adjust as appropriate)  Tube feeding to provide 2475 kcal, 135 grams of protein, and 3355 ml free water.   NUTRITION DIAGNOSIS:   Severe Malnutrition related to acute illness(intracerebral hemorrage) as evidenced by severe fat depletion, severe muscle depletion; ongoing  GOAL:   Patient will meet greater than or equal to 90% of their needs; met with TF  MONITOR:   TF tolerance, Skin, Weight trends, Labs, I & O's  REASON FOR ASSESSMENT:   Ventilator, Consult Enteral/tube feeding initiation and management  ASSESSMENT:   38 yo male admitted with large left intracranial hemorrhage; S/P craniotomy. PMH includes HTN, alcoholism, noncompliance.  9/12left frontal temporal craniectomy and evacuation hematoma.  9/23 Extubated.  10/1 PEG placed 11/23- Nutrition Focused Physical Exam performed, severe subcutaneous fat loss and severe muscle wasting identified.  11/28- SLP signed off, pt refusing oral care and po intake  Pt continues on NPO status and has been tolerating his bolus tube feeds well via PEG. RD to continue with current orders and will monitor for tolerance. No plans for skull flap replacement as pt with bedridden status and lack of progress since surgery. Per MD, pt remains on difficult long term placement for disposition plan.    Labs and medications reviewed.   Diet Order:   Diet Order            Diet NPO time specified  Diet effective midnight              EDUCATION NEEDS:   Not appropriate for education at this time  Skin:  Skin Assessment: Reviewed RN Assessment  Last BM:  12/3  Height:   Ht Readings from Last 1  Encounters:  09/09/19 _0  (1.753 m)    Weight:   Wt Readings from Last 1 Encounters:  11/30/19 63.1 kg    Ideal Body Weight:  72.7 kg  BMI:  Body mass index is 20.54 kg/m.  Estimated Nutritional Needs:   Kcal:  2100-2400  Protein:  105-125 grams  Fluid:  >/= 2 L/day    Corrin Parker, MS, RD, LDN Pager # [phone] After hours/ weekend pager # [phone]

## 2019-12-02 LAB — GLUCOSE, CAPILLARY
Glucose-Capillary: 105 mg/dL — ABNORMAL HIGH (ref 70–99)
Glucose-Capillary: 114 mg/dL — ABNORMAL HIGH (ref 70–99)
Glucose-Capillary: 118 mg/dL — ABNORMAL HIGH (ref 70–99)
Glucose-Capillary: 154 mg/dL — ABNORMAL HIGH (ref 70–99)
Glucose-Capillary: 96 mg/dL (ref 70–99)
Glucose-Capillary: 98 mg/dL (ref 70–99)

## 2019-12-02 NOTE — Progress Notes (Signed)
Occupational Therapy Treatment Patient Details Name: Joshua [name] MRN: [id number] DOB: 07/14/1981 Today's Date: 12/02/2019    History of present illness Pt is a 38 y.o. 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 S/p L frontoparietal craniectomy for evacuation of L basal ganglia hematoma 9/12. ETT 9/12-9/18, 9/18-9/23. PEG tube placed 9/28 secondary to dysphagia. PMH includes alcohol abuse, seizures, HTN.   OT comments  Pt in bed upon arrival gazing to L side. Max A rolling in bed, supine to sit total A +2, pt resistive holding onto covers/resisting. Pt followed 1/5 commands (20%), washed face with hand over hand max A. Pt gazing to L side and did not track to Right visual field on command and not attending to OT speaking to pt on his Right side. OT will continue to follow acutely  Follow Up Recommendations  SNF;Supervision/Assistance - 24 hour    Equipment Recommendations  None recommended by OT    Recommendations for Other Services      Precautions / Restrictions Precautions Precautions: Fall Precaution Comments: L crani, PEG, L hand soft mitt, helmet Required Braces or Orthoses: Splint/Cast Splint/Cast: resting hand splint and elbow splint for R UE due synergistic pattern Restrictions Weight Bearing Restrictions: No       Mobility Bed Mobility Overal bed mobility: Needs Assistance Bed Mobility: Supine to Sit;Sit to Supine;Rolling Rolling: Max assist   Supine to sit: Max assist;+2 for physical assistance Sit to supine: Total assist   General bed mobility comments: Max A + 2 due to patient not following commands and physically resisting  Transfers                      Balance                                           ADL either performed or assessed with clinical judgement   ADL Overall ADL's : Needs assistance/impaired     Grooming: Maximal assistance Grooming Details (indicate cue type  and reason): hand over hand assist                                      Vision Patient Visual Report: No change from baseline. Pt gazing to L side     Perception     Praxis      Cognition Arousal/Alertness: Awake/alert Behavior During Therapy: Flat affect;Restless Overall Cognitive Status: Impaired/Different from baseline Area of Impairment: Attention;Following commands;Safety/judgement;Awareness;Problem solving                     Memory: Decreased short-term memory Following Commands: Follows one step commands inconsistently Safety/Judgement: Decreased awareness of safety;Decreased awareness of deficits   Problem Solving: Slow processing;Decreased initiation;Difficulty sequencing;Requires verbal cues;Requires tactile cues General Comments: physically resistant and perseverating on holding onto blankets and bed rails        Exercises     Shoulder Instructions       General Comments      Pertinent Vitals/ Pain       Pain Assessment: Faces Pain Score: 4  Pain Location: general discomfort with mobility, noted also during head and neck movements/stretching Pain Descriptors / Indicators: Guarding;Grimacing Pain Intervention(s): Monitored during session;Repositioned  Home Living  Prior Functioning/Environment              Frequency  Min 2X/week        Progress Toward Goals  OT Goals(current goals can now be found in the care plan section)  Progress towards OT goals: OT to reassess next treatment  Acute Rehab OT Goals Patient Stated Goal: none stated  Plan Discharge plan remains appropriate    Co-evaluation                 AM-PAC OT "6 Clicks" Daily Activity     Outcome Measure   Help from another person eating meals?: Total Help from another person taking care of personal grooming?: A Lot Help from another person toileting, which includes using toliet, bedpan,  or urinal?: Total Help from another person bathing (including washing, rinsing, drying)?: Total Help from another person to put on and taking off regular upper body clothing?: Total Help from another person to put on and taking off regular lower body clothing?: Total 6 Click Score: 7    End of Session Equipment Utilized During Treatment: Gait belt;Other (comment)(helmet)  OT Visit Diagnosis: Other abnormalities of gait and mobility (R26.89);Hemiplegia and hemiparesis;Cognitive communication deficit (R41.841) Hemiplegia - Right/Left: Right   Activity Tolerance Patient tolerated treatment well   Patient Left in bed;with call bell/phone within reach   Nurse Communication          Time: 1141-1157 OT Time Calculation (min): 16 min  Charges: OT General Charges $OT Visit: 1 Visit OT Treatments $Therapeutic Activity: 8-22 mins     Britt Bottom 12/02/2019, 1:00 PM

## 2019-12-02 NOTE — Progress Notes (Signed)
PROGRESS NOTE    Joshua [name]  [id number] DOB: 1981-11-26 DOA: 09/09/2019 PCP: Patient, [name]     Brief Narrative:  Joshua [name] is a 38 year old male admitted by neurology on 9/12 with seizure, right hemiplegia, decreased responsiveness found to have left hemispheric intracranial hemorrhage, subsequently had a rapidly declining neurological status, intubated and admitted to ICU, underwent left craniotomy for decompression and evacuation of ICH on 9/12 by Dr. Vertell Limber. Continues to have slow neurological recovery, prolonged hospitalization treated for seizures and aspiration pneumonia.  Continues to be awake and alert, but aphasic. PEG tube placement on 9/28. Disposition continues to be challenging, uninsured, needs long-term placement.  New events last 24 hours / Subjective: No change  Assessment & Plan:   Principal Problem:   ICH (intracerebral hemorrhage) (HCC) Active Problems:   Subdural hematoma (HCC)   Intracerebral hemorrhage (HCC)   Cytotoxic brain edema (HCC)   Hemorrhagic stroke (HCC)   Acute respiratory failure ([place])   Essential hypertension   Severe protein-calorie malnutrition (HCC)   CKD (chronic kidney disease), stage IV (HCC)   Seizure (HCC)    Left intracranial hemorrhage  Patient underwent decompressive craniectomy and hematoma evacuation with implantation of skull flap in the right abdomen by Dr. Vertell Limber in 9/12. Felt to be secondary to uncontrolled, untreated hypertension, CTA was negative for aneurysm or vascular malformation. Remains aphasic/nonverbal with dense right hemiplegia, cognitive impairment, inconsistently follows commands. Previous hospitalist discussed with Dr. Vertell Limber 12/2 regarding flap replacement; given his bedridden status and lack of progress since surgery, no indication for replacement of his skull at this time as would only be a cosmetic fix -PT/OT following -Social work/case management following  Seizure Secondary to above ICH,  continue Keppra 500mg  BID  Aspiration pneumonia Dysphagia Sputum culture 09/11/2019 with normal oral flora.  Completed 7-day course of antibiotics during initial hospitalization. PEG tube placed for dysphagia.  Speech therapy was following and signed off on 11/24/2019 secondary to continued behaviors of refusal of speech therapy services. -Continue tube feeds per nutrition  Klebsiella oxytoca UTI -Completed course of cefazolin  Essential hypertension -Stable, continue amlodipine, hydralazine, lopressor   Severe protein calorie malnutrition -Continue tube feeds  Stage 2 CKD:  Creatinine at baseline, around 1.5. Check BMP intermittently.   Mild anemia of chronic disease: Hemoglobin stable. Check CBC intermittently.    DVT prophylaxis: Lovenox Code Status: Full Family Communication: None at bedside Disposition Plan: Difficult placement     Antimicrobials:  Anti-infectives (From admission, onward)   Start     Dose/Rate Route Frequency Ordered Stop   09/27/19 1130  cephALEXin (KEFLEX) 250 MG/5ML suspension 500 mg     500 mg Per Tube Every 12 hours 09/27/19 1052 09/29/19 2250   09/25/19 1445  ceFAZolin (ANCEF) IVPB 2g/100 mL premix  Status:  Discontinued     2 g 200 mL/hr over 30 Minutes Intravenous Every 12 hours 09/25/19 1417 09/27/19 1052   09/14/19 1000  Ampicillin-Sulbactam (UNASYN) 3 g in sodium chloride 0.9 % 100 mL IVPB     3 g 200 mL/hr over 30 Minutes Intravenous Every 8 hours 09/14/19 0922 09/17/19 1816   09/13/19 1100  vancomycin (VANCOCIN) IVPB 750 mg/150 ml premix  Status:  Discontinued     750 mg 150 mL/hr over 60 Minutes Intravenous Every 24 hours 09/13/19 0728 09/13/19 0838   09/12/19 1100  vancomycin (VANCOCIN) IVPB 1000 mg/200 mL premix  Status:  Discontinued     1,000 mg 200 mL/hr over 60 Minutes Intravenous Every 24 hours  09/11/19 1034 09/13/19 0728   09/11/19 1130  piperacillin-tazobactam (ZOSYN) IVPB 3.375 g  Status:  Discontinued     3.375 g  12.5 mL/hr over 240 Minutes Intravenous Every 8 hours 09/11/19 1054 09/14/19 0845   09/11/19 1100  vancomycin (VANCOCIN) 1,500 mg in sodium chloride 0.9 % 500 mL IVPB     1,500 mg 250 mL/hr over 120 Minutes Intravenous  Once 09/11/19 1034 09/11/19 1900   09/11/19 1015  piperacillin-tazobactam (ZOSYN) IVPB 4.5 g  Status:  Discontinued     4.5 g 200 mL/hr over 30 Minutes Intravenous Every 8 hours 09/11/19 1010 09/11/19 1052   09/09/19 0845  ceFAZolin (ANCEF) IVPB 2g/100 mL premix     2 g 200 mL/hr over 30 Minutes Intravenous Every 8 hours 09/09/19 0842 09/09/19 1658       Objective: Vitals:   12/02/19 0016 12/02/19 0418 12/02/19 0813 12/02/19 0816  BP: 123/87 (!) 121/98 (!) 125/96 (!) 137/113  Pulse: 100 92 90 90  Resp: 20 18 16 16   Temp: 98.5 F (36.9 C) 97.6 F (36.4 C) 97.9 F (36.6 C)   TempSrc: Axillary Axillary Oral   SpO2: 99% 100%  100%  Weight:      Height:        Intake/Output Summary (Last 24 hours) at 12/02/2019 1016 Last data filed at 12/02/2019 0647 Gross per 24 hour  Intake -  Output 2200 ml  Net -2200 ml   Filed Weights   11/27/19 0500 11/28/19 0500 11/30/19 0322  Weight: 59.4 kg 59.3 kg 63.1 kg    Examination: General exam: Appears calm and comfortable  Central nervous system: Alert, remains aphasic     Data Reviewed: I have personally reviewed following labs and imaging studies  CBC: Recent Labs  Lab 11/26/19 0458 11/29/19 0651  WBC 7.0 5.8  HGB 11.7* 11.5*  HCT 36.3* 36.4*  MCV 84.4 84.8  PLT 299 [id number]   Basic Metabolic Panel: Recent Labs  Lab 11/26/19 0458 11/29/19 0651  NA 136 137  K 4.3 4.5  CL 98 98  CO2 27 26  GLUCOSE 112* 104*  BUN 27* 34*  CREATININE 1.37* 1.40*  CALCIUM 10.1 10.2   GFR: Estimated Creatinine Clearance: 63.2 mL/min (A) (by C-G formula based on SCr of 1.4 mg/dL (H)). Liver Function Tests: No results for input(s): AST, ALT, ALKPHOS, BILITOT, PROT, ALBUMIN in the last 168 hours. No results for input(s):  LIPASE, AMYLASE in the last 168 hours. No results for input(s): AMMONIA in the last 168 hours. Coagulation Profile: No results for input(s): INR, PROTIME in the last 168 hours. Cardiac Enzymes: No results for input(s): CKTOTAL, CKMB, CKMBINDEX, TROPONINI in the last 168 hours. BNP (last 3 results) No results for input(s): PROBNP in the last 8760 hours. HbA1C: No results for input(s): HGBA1C in the last 72 hours. CBG: Recent Labs  Lab 12/01/19 1549 12/01/19 2019 12/01/19 2358 12/02/19 0409 12/02/19 0810  GLUCAP 125* 124* 170* 98 96   Lipid Profile: No results for input(s): CHOL, HDL, LDLCALC, TRIG, CHOLHDL, LDLDIRECT in the last 72 hours. Thyroid Function Tests: No results for input(s): TSH, T4TOTAL, FREET4, T3FREE, THYROIDAB in the last 72 hours. Anemia Panel: No results for input(s): VITAMINB12, FOLATE, FERRITIN, TIBC, IRON, RETICCTPCT in the last 72 hours. Sepsis Labs: No results for input(s): PROCALCITON, LATICACIDVEN in the last 168 hours.  No results found for this or any previous visit (from the past 240 hour(s)).    Radiology Studies: No results found.    Scheduled  Meds: . amLODipine  10 mg Per Tube Daily  . chlorhexidine  15 mL Mouth Rinse BID  . enoxaparin (LOVENOX) injection  40 mg Subcutaneous Daily  . feeding supplement (OSMOLITE 1.2 CAL)  474 mL Per Tube QID  . feeding supplement (PRO-STAT SUGAR FREE 64)  30 mL Per Tube BID  . folic acid  1 mg Per Tube Daily  . free water  300 mL Per Tube Q4H  . hydrALAZINE  10 mg Per Tube Q8H  . insulin aspart  0-15 Units Subcutaneous Q4H  . levETIRAcetam  500 mg Per Tube BID  . mouth rinse  15 mL Mouth Rinse q12n4p  . metoprolol tartrate  100 mg Per Tube BID  . multivitamin  15 mL Per Tube Daily  . pantoprazole sodium  40 mg Per Tube BID  . QUEtiapine  50 mg Per Tube QHS  . sodium chloride flush  10-40 mL Intracatheter Q12H  . thiamine  100 mg Per Tube Daily   Continuous Infusions:   LOS: 84 days      Time  spent: 10 minutes   Dessa Phi, DO [facility] 12/02/2019, 10:16 AM   Available via Epic secure chat 7am-7pm After these hours, please refer to coverage provider listed on [email or web address]

## 2019-12-03 LAB — GLUCOSE, CAPILLARY
Glucose-Capillary: 98 mg/dL (ref 70–99)
Glucose-Capillary: 91 mg/dL (ref 70–99)
Glucose-Capillary: 126 mg/dL — ABNORMAL HIGH (ref 70–99)
Glucose-Capillary: 106 mg/dL — ABNORMAL HIGH (ref 70–99)
Glucose-Capillary: 96 mg/dL (ref 70–99)

## 2019-12-03 NOTE — Progress Notes (Signed)
PROGRESS NOTE    Joshua [name]  [id number] DOB: [birth year] DOA: 09/09/2019 PCP: Patient, [name]     Brief Narrative:  Joshua [name] is a 38 year old male admitted by neurology on 9/12 with seizure, right hemiplegia, decreased responsiveness found to have left hemispheric intracranial hemorrhage, subsequently had a rapidly declining neurological status, intubated and admitted to ICU, underwent left craniotomy for decompression and evacuation of ICH on 9/12 by Dr. Vertell Limber. Continues to have slow neurological recovery, prolonged hospitalization treated for seizures and aspiration pneumonia.  Continues to be awake and alert, but aphasic. PEG tube placement on 9/28. Disposition continues to be challenging, uninsured, needs long-term placement.  New events last 24 hours / Subjective: No change overnight  Assessment & Plan:   Principal Problem:   ICH (intracerebral hemorrhage) (HCC) Active Problems:   Subdural hematoma (HCC)   Intracerebral hemorrhage (HCC)   Cytotoxic brain edema (HCC)   Hemorrhagic stroke (HCC)   Acute respiratory failure ([place])   Essential hypertension   Severe protein-calorie malnutrition (HCC)   CKD (chronic kidney disease), stage IV (HCC)   Seizure (HCC)    Left intracranial hemorrhage  Patient underwent decompressive craniectomy and hematoma evacuation with implantation of skull flap in the right abdomen by Dr. Vertell Limber in 9/12. Felt to be secondary to uncontrolled, untreated hypertension, CTA was negative for aneurysm or vascular malformation. Remains aphasic/nonverbal with dense right hemiplegia, cognitive impairment, inconsistently follows commands. Previous hospitalist discussed with Dr. Vertell Limber 12/2 regarding flap replacement; given his bedridden status and lack of progress since surgery, no indication for replacement of his skull at this time as would only be a cosmetic fix -PT/OT following -Social work/case management following  Seizure Secondary to above  ICH, continue Keppra 500mg  BID  Aspiration pneumonia Dysphagia Sputum culture 09/11/2019 with normal oral flora.  Completed 7-day course of antibiotics during initial hospitalization. PEG tube placed for dysphagia.  Speech therapy was following and signed off on 11/24/2019 secondary to continued behaviors of refusal of speech therapy services. -Continue tube feeds per nutrition  Klebsiella oxytoca UTI -Completed course of cefazolin  Essential hypertension -Stable, continue amlodipine, hydralazine, lopressor   Severe protein calorie malnutrition -Continue tube feeds  Stage 2 CKD:  Creatinine at baseline, around 1.5. Check BMP intermittently.   Mild anemia of chronic disease: Hemoglobin stable. Check CBC intermittently.    DVT prophylaxis: Lovenox Code Status: Full Family Communication: None at bedside Disposition Plan: Difficult placement     Antimicrobials:  Anti-infectives (From admission, onward)   Start     Dose/Rate Route Frequency Ordered Stop   09/27/19 1130  cephALEXin (KEFLEX) 250 MG/5ML suspension 500 mg     500 mg Per Tube Every 12 hours 09/27/19 1052 09/29/19 2250   09/25/19 1445  ceFAZolin (ANCEF) IVPB 2g/100 mL premix  Status:  Discontinued     2 g 200 mL/hr over 30 Minutes Intravenous Every 12 hours 09/25/19 1417 09/27/19 1052   09/14/19 1000  Ampicillin-Sulbactam (UNASYN) 3 g in sodium chloride 0.9 % 100 mL IVPB     3 g 200 mL/hr over 30 Minutes Intravenous Every 8 hours 09/14/19 0922 09/17/19 1816   09/13/19 1100  vancomycin (VANCOCIN) IVPB 750 mg/150 ml premix  Status:  Discontinued     750 mg 150 mL/hr over 60 Minutes Intravenous Every 24 hours 09/13/19 0728 09/13/19 0838   09/12/19 1100  vancomycin (VANCOCIN) IVPB 1000 mg/200 mL premix  Status:  Discontinued     1,000 mg 200 mL/hr over 60 Minutes Intravenous Every 24  hours 09/11/19 1034 09/13/19 0728   09/11/19 1130  piperacillin-tazobactam (ZOSYN) IVPB 3.375 g  Status:  Discontinued     3.375 g  12.5 mL/hr over 240 Minutes Intravenous Every 8 hours 09/11/19 1054 09/14/19 0845   09/11/19 1100  vancomycin (VANCOCIN) 1,500 mg in sodium chloride 0.9 % 500 mL IVPB     1,500 mg 250 mL/hr over 120 Minutes Intravenous  Once 09/11/19 1034 09/11/19 1900   09/11/19 1015  piperacillin-tazobactam (ZOSYN) IVPB 4.5 g  Status:  Discontinued     4.5 g 200 mL/hr over 30 Minutes Intravenous Every 8 hours 09/11/19 1010 09/11/19 1052   09/09/19 0845  ceFAZolin (ANCEF) IVPB 2g/100 mL premix     2 g 200 mL/hr over 30 Minutes Intravenous Every 8 hours 09/09/19 0842 09/09/19 1658       Objective: Vitals:   12/02/19 2355 12/03/19 0333 12/03/19 0352 12/03/19 0817  BP: 108/75 105/85  (!) 114/91  Pulse: 91 86  90  Resp: 18 18  14   Temp: 98.1 F (36.7 C) 98 F (36.7 C)  98.1 F (36.7 C)  TempSrc:    Axillary  SpO2: 99% 100%  99%  Weight:   63.4 kg   Height:        Intake/Output Summary (Last 24 hours) at 12/03/2019 0937 Last data filed at 12/03/2019 0503 Gross per 24 hour  Intake 1074 ml  Output 1550 ml  Net -476 ml   Filed Weights   11/28/19 0500 11/30/19 0322 12/03/19 0352  Weight: 59.3 kg 63.1 kg 63.4 kg    Examination: General exam: Appears calm and comfortable  Central nervous system: Alert    Data Reviewed: I have personally reviewed following labs and imaging studies  CBC: Recent Labs  Lab 11/29/19 0651  WBC 5.8  HGB 11.5*  HCT 36.4*  MCV 84.8  PLT [id number]   Basic Metabolic Panel: Recent Labs  Lab 11/29/19 0651  NA 137  K 4.5  CL 98  CO2 26  GLUCOSE 104*  BUN 34*  CREATININE 1.40*  CALCIUM 10.2   GFR: Estimated Creatinine Clearance: 63.5 mL/min (A) (by C-G formula based on SCr of 1.4 mg/dL (H)). Liver Function Tests: No results for input(s): AST, ALT, ALKPHOS, BILITOT, PROT, ALBUMIN in the last 168 hours. No results for input(s): LIPASE, AMYLASE in the last 168 hours. No results for input(s): AMMONIA in the last 168 hours. Coagulation Profile: No results  for input(s): INR, PROTIME in the last 168 hours. Cardiac Enzymes: No results for input(s): CKTOTAL, CKMB, CKMBINDEX, TROPONINI in the last 168 hours. BNP (last 3 results) No results for input(s): PROBNP in the last 8760 hours. HbA1C: No results for input(s): HGBA1C in the last 72 hours. CBG: Recent Labs  Lab 12/02/19 1558 12/02/19 2012 12/02/19 2349 12/03/19 0333 12/03/19 0819  GLUCAP 105* 114* 154* 98 91   Lipid Profile: No results for input(s): CHOL, HDL, LDLCALC, TRIG, CHOLHDL, LDLDIRECT in the last 72 hours. Thyroid Function Tests: No results for input(s): TSH, T4TOTAL, FREET4, T3FREE, THYROIDAB in the last 72 hours. Anemia Panel: No results for input(s): VITAMINB12, FOLATE, FERRITIN, TIBC, IRON, RETICCTPCT in the last 72 hours. Sepsis Labs: No results for input(s): PROCALCITON, LATICACIDVEN in the last 168 hours.  No results found for this or any previous visit (from the past 240 hour(s)).    Radiology Studies: No results found.    Scheduled Meds: . amLODipine  10 mg Per Tube Daily  . chlorhexidine  15 mL Mouth Rinse BID  .  enoxaparin (LOVENOX) injection  40 mg Subcutaneous Daily  . feeding supplement (OSMOLITE 1.2 CAL)  474 mL Per Tube QID  . feeding supplement (PRO-STAT SUGAR FREE 64)  30 mL Per Tube BID  . folic acid  1 mg Per Tube Daily  . free water  300 mL Per Tube Q4H  . hydrALAZINE  10 mg Per Tube Q8H  . insulin aspart  0-15 Units Subcutaneous Q4H  . levETIRAcetam  500 mg Per Tube BID  . mouth rinse  15 mL Mouth Rinse q12n4p  . metoprolol tartrate  100 mg Per Tube BID  . multivitamin  15 mL Per Tube Daily  . pantoprazole sodium  40 mg Per Tube BID  . QUEtiapine  50 mg Per Tube QHS  . sodium chloride flush  10-40 mL Intracatheter Q12H  . thiamine  100 mg Per Tube Daily   Continuous Infusions:   LOS: 85 days      Time spent: 10 minutes   Dessa Phi, DO [facility] 12/03/2019, 9:37 AM   Available via Epic secure chat 7am-7pm After  these hours, please refer to coverage provider listed on [email or web address]

## 2019-12-04 LAB — GLUCOSE, CAPILLARY
Glucose-Capillary: 111 mg/dL — ABNORMAL HIGH (ref 70–99)
Glucose-Capillary: 113 mg/dL — ABNORMAL HIGH (ref 70–99)
Glucose-Capillary: 118 mg/dL — ABNORMAL HIGH (ref 70–99)
Glucose-Capillary: 86 mg/dL (ref 70–99)
Glucose-Capillary: 87 mg/dL (ref 70–99)
Glucose-Capillary: 88 mg/dL (ref 70–99)

## 2019-12-04 NOTE — Progress Notes (Signed)
PROGRESS NOTE    Joshua [name]  [id number] DOB: 05/04/1981 DOA: 09/09/2019 PCP: Patient, [name]     Brief Narrative:  Joshua [name] is a 38 year old male admitted by neurology on 9/12 with seizure, right hemiplegia, decreased responsiveness found to have left hemispheric intracranial hemorrhage, subsequently had a rapidly declining neurological status, intubated and admitted to ICU, underwent left craniotomy for decompression and evacuation of ICH on 9/12 by Dr. Vertell Limber. Continues to have slow neurological recovery, prolonged hospitalization treated for seizures and aspiration pneumonia.  Continues to be awake and alert, but aphasic. PEG tube placement on 9/28. Disposition continues to be challenging, uninsured, needs long-term placement.  New events last 24 hours / Subjective: Nothing new overnight  Assessment & Plan:   Principal Problem:   ICH (intracerebral hemorrhage) (HCC) Active Problems:   Subdural hematoma (HCC)   Intracerebral hemorrhage (HCC)   Cytotoxic brain edema (HCC)   Hemorrhagic stroke (HCC)   Acute respiratory failure ([place])   Essential hypertension   Severe protein-calorie malnutrition (HCC)   CKD (chronic kidney disease), stage IV (HCC)   Seizure (HCC)    Left intracranial hemorrhage  Patient underwent decompressive craniectomy and hematoma evacuation with implantation of skull flap in the right abdomen by Dr. Vertell Limber in 9/12. Felt to be secondary to uncontrolled, untreated hypertension, CTA was negative for aneurysm or vascular malformation. Remains aphasic/nonverbal with dense right hemiplegia, cognitive impairment, inconsistently follows commands. Previous hospitalist discussed with Dr. Vertell Limber 12/2 regarding flap replacement; given his bedridden status and lack of progress since surgery, no indication for replacement of his skull at this time as would only be a cosmetic fix -PT/OT following -Social work/case management following  Seizure Secondary to  above ICH, continue Keppra 500mg  BID  Aspiration pneumonia Dysphagia Sputum culture 09/11/2019 with normal oral flora.  Completed 7-day course of antibiotics during initial hospitalization. PEG tube placed for dysphagia.  Speech therapy was following and signed off on 11/24/2019 secondary to continued behaviors of refusal of speech therapy services. -Continue tube feeds per nutrition  Klebsiella oxytoca UTI -Completed course of cefazolin  Essential hypertension -Stable, continue amlodipine, hydralazine, lopressor   Severe protein calorie malnutrition -Continue tube feeds  Stage 2 CKD:  Creatinine at baseline, around 1.5. Check BMP intermittently.   Mild anemia of chronic disease: Hemoglobin stable. Check CBC intermittently.    DVT prophylaxis: Lovenox Code Status: Full Family Communication: None at bedside Disposition Plan: Difficult placement     Antimicrobials:  Anti-infectives (From admission, onward)   Start     Dose/Rate Route Frequency Ordered Stop   09/27/19 1130  cephALEXin (KEFLEX) 250 MG/5ML suspension 500 mg     500 mg Per Tube Every 12 hours 09/27/19 1052 09/29/19 2250   09/25/19 1445  ceFAZolin (ANCEF) IVPB 2g/100 mL premix  Status:  Discontinued     2 g 200 mL/hr over 30 Minutes Intravenous Every 12 hours 09/25/19 1417 09/27/19 1052   09/14/19 1000  Ampicillin-Sulbactam (UNASYN) 3 g in sodium chloride 0.9 % 100 mL IVPB     3 g 200 mL/hr over 30 Minutes Intravenous Every 8 hours 09/14/19 0922 09/17/19 1816   09/13/19 1100  vancomycin (VANCOCIN) IVPB 750 mg/150 ml premix  Status:  Discontinued     750 mg 150 mL/hr over 60 Minutes Intravenous Every 24 hours 09/13/19 0728 09/13/19 0838   09/12/19 1100  vancomycin (VANCOCIN) IVPB 1000 mg/200 mL premix  Status:  Discontinued     1,000 mg 200 mL/hr over 60 Minutes Intravenous Every 24  hours 09/11/19 1034 09/13/19 0728   09/11/19 1130  piperacillin-tazobactam (ZOSYN) IVPB 3.375 g  Status:  Discontinued      3.375 g 12.5 mL/hr over 240 Minutes Intravenous Every 8 hours 09/11/19 1054 09/14/19 0845   09/11/19 1100  vancomycin (VANCOCIN) 1,500 mg in sodium chloride 0.9 % 500 mL IVPB     1,500 mg 250 mL/hr over 120 Minutes Intravenous  Once 09/11/19 1034 09/11/19 1900   09/11/19 1015  piperacillin-tazobactam (ZOSYN) IVPB 4.5 g  Status:  Discontinued     4.5 g 200 mL/hr over 30 Minutes Intravenous Every 8 hours 09/11/19 1010 09/11/19 1052   09/09/19 0845  ceFAZolin (ANCEF) IVPB 2g/100 mL premix     2 g 200 mL/hr over 30 Minutes Intravenous Every 8 hours 09/09/19 0842 09/09/19 1658       Objective: Vitals:   12/03/19 1955 12/04/19 0042 12/04/19 0349 12/04/19 0804  BP: 114/90 (!) 124/95 (!) 122/96 115/81  Pulse: 98 81 87 92  Resp: 16 16 16 16   Temp: 99 F (37.2 C) 97.6 F (36.4 C) 97.8 F (36.6 C) 98.2 F (36.8 C)  TempSrc: Axillary Oral Oral Oral  SpO2: 100% 100% 100% 99%  Weight:      Height:        Intake/Output Summary (Last 24 hours) at 12/04/2019 0941 Last data filed at 12/04/2019 0700 Gross per 24 hour  Intake 2354 ml  Output 1550 ml  Net 804 ml   Filed Weights   11/28/19 0500 11/30/19 0322 12/03/19 0352  Weight: 59.3 kg 63.1 kg 63.4 kg    Examination: General exam: Appears calm and comfortable    Data Reviewed: I have personally reviewed following labs and imaging studies  CBC: Recent Labs  Lab 11/29/19 0651  WBC 5.8  HGB 11.5*  HCT 36.4*  MCV 84.8  PLT [id number]   Basic Metabolic Panel: Recent Labs  Lab 11/29/19 0651  NA 137  K 4.5  CL 98  CO2 26  GLUCOSE 104*  BUN 34*  CREATININE 1.40*  CALCIUM 10.2   GFR: Estimated Creatinine Clearance: 63.5 mL/min (A) (by C-G formula based on SCr of 1.4 mg/dL (H)). Liver Function Tests: No results for input(s): AST, ALT, ALKPHOS, BILITOT, PROT, ALBUMIN in the last 168 hours. No results for input(s): LIPASE, AMYLASE in the last 168 hours. No results for input(s): AMMONIA in the last 168 hours. Coagulation  Profile: No results for input(s): INR, PROTIME in the last 168 hours. Cardiac Enzymes: No results for input(s): CKTOTAL, CKMB, CKMBINDEX, TROPONINI in the last 168 hours. BNP (last 3 results) No results for input(s): PROBNP in the last 8760 hours. HbA1C: No results for input(s): HGBA1C in the last 72 hours. CBG: Recent Labs  Lab 12/03/19 1532 12/03/19 1954 12/03/19 2348 12/04/19 0326 12/04/19 0803  GLUCAP 106* 96 113* 88 87   Lipid Profile: No results for input(s): CHOL, HDL, LDLCALC, TRIG, CHOLHDL, LDLDIRECT in the last 72 hours. Thyroid Function Tests: No results for input(s): TSH, T4TOTAL, FREET4, T3FREE, THYROIDAB in the last 72 hours. Anemia Panel: No results for input(s): VITAMINB12, FOLATE, FERRITIN, TIBC, IRON, RETICCTPCT in the last 72 hours. Sepsis Labs: No results for input(s): PROCALCITON, LATICACIDVEN in the last 168 hours.  No results found for this or any previous visit (from the past 240 hour(s)).    Radiology Studies: No results found.    Scheduled Meds: . amLODipine  10 mg Per Tube Daily  . chlorhexidine  15 mL Mouth Rinse BID  . enoxaparin (  LOVENOX) injection  40 mg Subcutaneous Daily  . feeding supplement (OSMOLITE 1.2 CAL)  474 mL Per Tube QID  . feeding supplement (PRO-STAT SUGAR FREE 64)  30 mL Per Tube BID  . folic acid  1 mg Per Tube Daily  . free water  300 mL Per Tube Q4H  . hydrALAZINE  10 mg Per Tube Q8H  . insulin aspart  0-15 Units Subcutaneous Q4H  . levETIRAcetam  500 mg Per Tube BID  . mouth rinse  15 mL Mouth Rinse q12n4p  . metoprolol tartrate  100 mg Per Tube BID  . multivitamin  15 mL Per Tube Daily  . pantoprazole sodium  40 mg Per Tube BID  . QUEtiapine  50 mg Per Tube QHS  . sodium chloride flush  10-40 mL Intracatheter Q12H  . thiamine  100 mg Per Tube Daily   Continuous Infusions:   LOS: 86 days      Time spent: 10 minutes   Dessa Phi, DO [facility] 12/04/2019, 9:41 AM   Available via Epic secure  chat 7am-7pm After these hours, please refer to coverage provider listed on [email or web address]

## 2019-12-04 NOTE — TOC Progression Note (Signed)
Transition of Care [facility] - Progression Note    Patient Details  Name: Joshua [name] MRN: [id number] Date of Birth: Sep 26, 1981  [facility] CM/SW Contact  Pollie Friar, RN Phone Number: 12/04/2019, 3:53 PM  Clinical Narrative:    Still no bed offers and on LOG list. Mother states the plan is for patient to d/c home after a rehab stay. TOC following.   Expected Discharge Plan: [place] Barriers to Discharge: Continued Medical Work up, SNF Pending payor source - LOG  Expected Discharge Plan and Services Expected Discharge Plan: [place] In-house Referral: Clinical Social Work Discharge Planning Services: NA Post Acute Care Choice: [place] Living arrangements for the past 2 months: Apartment                 DME Arranged: N/A DME Agency: NA       HH Arranged: NA HH Agency: NA         Social Determinants of Health (SDOH) Interventions    Readmission Risk Interventions No flowsheet data found.

## 2019-12-05 LAB — GLUCOSE, CAPILLARY
Glucose-Capillary: 100 mg/dL — ABNORMAL HIGH (ref 70–99)
Glucose-Capillary: 114 mg/dL — ABNORMAL HIGH (ref 70–99)
Glucose-Capillary: 140 mg/dL — ABNORMAL HIGH (ref 70–99)
Glucose-Capillary: 78 mg/dL (ref 70–99)
Glucose-Capillary: 88 mg/dL (ref 70–99)
Glucose-Capillary: 96 mg/dL (ref 70–99)

## 2019-12-05 NOTE — Progress Notes (Signed)
Physical Therapy Treatment Patient Details Name: Joshua [name] MRN: [id number] DOB: [birth year] Today's Date: 12/05/2019    History of Present Illness Pt is a 38 y.o. 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 S/p L frontoparietal craniectomy for evacuation of L basal ganglia hematoma 9/12. ETT 9/12-9/18, 9/18-9/23. PEG tube placed 9/28 secondary to dysphagia. PMH includes alcohol abuse, seizures, HTN.    PT Comments    Pt performed functional mobility and pre-gt activities during session this pm.  Pt stood in [place] stedy with foot plate removed.  Focus of session on finding and maintaining midline and for weight shifting.  Performed cervical extension and scapular retraction with R foot weight bearing.  Pt also facilitated for B knee extension and flexion.  Plan remains appropriate for SNF placement at this time.       Follow Up Recommendations  SNF     Equipment Recommendations  Wheelchair (measurements PT);Wheelchair cushion (measurements PT);Hospital bed;3in1 (PT)    Recommendations for Other Services       Precautions / Restrictions Precautions Precautions: Fall Precaution Comments: L crani, PEG, L hand soft mitt, helmet Required Braces or Orthoses: Splint/Cast Splint/Cast: resting hand splint and elbow splint for R UE due synergistic pattern Restrictions Weight Bearing Restrictions: No    Mobility  Bed Mobility Overal bed mobility: Needs Assistance Bed Mobility: Supine to Sit;Sit to Supine Rolling: Max assist   Supine to sit: Max assist;+2 for physical assistance     General bed mobility comments: Max A + 2 due to patient not following commands and physically resisting  Pt pushing posterior once in sitting.  Placed in [place] + sit to stand frame and able to hold to hand grip with left hand,  Transfers Overall transfer level: Needs assistance Equipment used: Ambulation equipment used(sara +) Transfers: Sit to/from  Stand Sit to Stand: Max assist;+2 physical assistance         General transfer comment: Pt initiating movement to and from seated surface.  Pt required assistance in standing for weight shifting and for facilitation on knee flexion and extension.  Facilitation also provided for head and upper trunk control.  Pt participating in head control invoulntarily at time.  He continues to respond to nxious stimuli.  Pt stood in frame x 15 min.  Ambulation/Gait Ambulation/Gait assistance: (NT attempted but wound not bear weight on R LE so deferred back to standing tolerance and pre gt activities.)               Stairs             Wheelchair Mobility    Modified Rankin (Stroke Patients Only)       Balance Overall balance assessment: Needs assistance Sitting-balance support: Feet supported;Single extremity supported Sitting balance-Leahy Scale: Poor Sitting balance - Comments: Posterior push. Postural control: Right lateral lean   Standing balance-Leahy Scale: Poor Standing balance comment: assistance to weight shift L and to right to midline.  Focused on engaging B knee extension in standing.                            Cognition Arousal/Alertness: Awake/alert Behavior During Therapy: Flat affect;Restless Overall Cognitive Status: Impaired/Different from baseline Area of Impairment: Attention;Following commands;Safety/judgement;Awareness;Problem solving                   Current Attention Level: Sustained Memory: Decreased short-term memory Following Commands: Follows one step commands inconsistently(unsure if  he is actually following commands) Safety/Judgement: Decreased awareness of safety;Decreased awareness of deficits Awareness: Intellectual Problem Solving: Slow processing;Decreased initiation;Difficulty sequencing;Requires verbal cues;Requires tactile cues General Comments: physically resistant and perseverating on holding onto blankets and bed  rails      Exercises      General Comments        Pertinent Vitals/Pain Pain Assessment: Faces Faces Pain Scale: Hurts little more Pain Location: general discomfort with mobility, noted also during head and neck movements/stretching Pain Descriptors / Indicators: Guarding;Grimacing Pain Intervention(s): Monitored during session;Repositioned    Home Living                      Prior Function            PT Goals (current goals can now be found in the care plan section) Acute Rehab PT Goals Patient Stated Goal: none stated Potential to Achieve Goals: Fair Progress towards PT goals: Progressing toward goals    Frequency    Min 2X/week      PT Plan Current plan remains appropriate    Co-evaluation PT/OT/SLP Co-Evaluation/Treatment: Yes Reason for Co-Treatment: Complexity of the patient's impairments (multi-system involvement);For patient/therapist safety;Necessary to address cognition/behavior during functional activity;To address functional/ADL transfers PT goals addressed during session: Mobility/safety with mobility OT goals addressed during session: ADL's and self-care      AM-PAC PT "6 Clicks" Mobility   Outcome Measure  Help needed turning from your back to your side while in a flat bed without using bedrails?: Total Help needed moving from lying on your back to sitting on the side of a flat bed without using bedrails?: Total Help needed moving to and from a bed to a chair (including a wheelchair)?: Total Help needed standing up from a chair using your arms (e.g., wheelchair or bedside chair)?: Total Help needed to walk in hospital room?: Total Help needed climbing 3-5 steps with a railing? : Total 6 Click Score: 6    End of Session Equipment Utilized During Treatment: Gait belt Activity Tolerance: Patient tolerated treatment well Patient left: in chair;with call bell/phone within reach(seated at nurses station with tilt in space WC.) Nurse  Communication: Mobility status PT Visit Diagnosis: Muscle weakness (generalized) (M62.81);Hemiplegia and hemiparesis;Other abnormalities of gait and mobility (R26.89) Hemiplegia - Right/Left: Right Hemiplegia - dominant/non-dominant: Dominant Hemiplegia - caused by: Nontraumatic intracerebral hemorrhage     Time: [id number] PT Time Calculation (min) (ACUTE ONLY): 48 min  Charges:  $Therapeutic Activity: 8-22 mins                     [name] , PTA Acute Rehabilitation Services Pager [phone] Office [phone]     Raia Amico [name] 12/05/2019, 3:24 PM

## 2019-12-05 NOTE — Plan of Care (Signed)
  Problem: Education: Goal: Knowledge of General Education information will improve Description: Including pain rating scale, medication(s)/side effects and non-pharmacologic comfort measures Outcome: Progressing   Problem: Health Behavior/Discharge Planning: Goal: Ability to manage health-related needs will improve Outcome: Progressing   Problem: Clinical Measurements: Goal: Will remain free from infection Outcome: Progressing Goal: Diagnostic test results will improve Outcome: Progressing Goal: Respiratory complications will improve Outcome: Progressing Goal: Cardiovascular complication will be avoided Outcome: Progressing   Problem: Activity: Goal: Risk for activity intolerance will decrease Outcome: Progressing   Problem: Nutrition: Goal: Adequate nutrition will be maintained Outcome: Progressing   Problem: Coping: Goal: Level of anxiety will decrease Outcome: Progressing   Problem: Elimination: Goal: Will not experience complications related to bowel motility Outcome: Progressing Goal: Will not experience complications related to urinary retention Outcome: Progressing   Problem: Pain Managment: Goal: General experience of comfort will improve Outcome: Progressing   Problem: Safety: Goal: Ability to remain free from injury will improve Outcome: Progressing   Problem: Skin Integrity: Goal: Risk for impaired skin integrity will decrease Outcome: Progressing   Problem: Clinical Measurements: Goal: Usual level of consciousness will be regained or maintained. Outcome: Progressing Goal: Neurologic status will improve Outcome: Progressing Goal: Ability to maintain intracranial pressure will improve Outcome: Progressing   Problem: Skin Integrity: Goal: Demonstration of wound healing without infection will improve Outcome: Progressing   Problem: Education: Goal: Knowledge of disease or condition will improve Outcome: Progressing Goal: Knowledge of secondary  prevention will improve Outcome: Progressing Goal: Knowledge of patient specific risk factors addressed and post discharge goals established will improve Outcome: Progressing Goal: Individualized Educational Video(s) Outcome: Progressing   Problem: Coping: Goal: Will verbalize positive feelings about self Outcome: Progressing Goal: Will identify appropriate support needs Outcome: Progressing   Problem: Health Behavior/Discharge Planning: Goal: Ability to manage health-related needs will improve Outcome: Progressing   Problem: Self-Care: Goal: Ability to participate in self-care as condition permits will improve Outcome: Progressing Goal: Ability to communicate needs accurately will improve Outcome: Progressing   Problem: Nutrition: Goal: Risk of aspiration will decrease Outcome: Progressing Goal: Dietary intake will improve Outcome: Progressing   Problem: Intracerebral Hemorrhage Tissue Perfusion: Goal: Complications of Intracerebral Hemorrhage will be minimized Outcome: Progressing   Ival Bible, BSN, RN

## 2019-12-05 NOTE — Progress Notes (Signed)
PROGRESS NOTE    Joshua [name]  [id number] DOB: 09-02-1981 DOA: 09/09/2019 PCP: Patient, [name]     Brief Narrative:  Joshua [name] is a 38 year old male admitted by neurology on 9/12 with seizure, right hemiplegia, decreased responsiveness found to have left hemispheric intracranial hemorrhage, subsequently had a rapidly declining neurological status, intubated and admitted to ICU, underwent left craniotomy for decompression and evacuation of ICH on 9/12 by Dr. Vertell Limber. Continues to have slow neurological recovery, prolonged hospitalization treated for seizures and aspiration pneumonia.  Continues to be awake and alert, but aphasic. PEG tube placement on 9/28. Disposition continues to be challenging, uninsured, needs long-term placement.  New events last 24 hours / Subjective: No new events.   Assessment & Plan:   Principal Problem:   ICH (intracerebral hemorrhage) (HCC) Active Problems:   Subdural hematoma (HCC)   Intracerebral hemorrhage (HCC)   Cytotoxic brain edema (HCC)   Hemorrhagic stroke (HCC)   Acute respiratory failure ([place])   Essential hypertension   Severe protein-calorie malnutrition ([place])   CKD (chronic kidney disease), stage IV (HCC)   Seizure (HCC)    Left intracranial hemorrhage  Patient underwent decompressive craniectomy and hematoma evacuation with implantation of skull flap in the right abdomen by Dr. Vertell Limber in 9/12. Felt to be secondary to uncontrolled, untreated hypertension, CTA was negative for aneurysm or vascular malformation. Remains aphasic/nonverbal with dense right hemiplegia, cognitive impairment, inconsistently follows commands. Previous hospitalist discussed with Dr. Vertell Limber 12/2 regarding flap replacement; given his bedridden status and lack of progress since surgery, no indication for replacement of his skull at this time as would only be a cosmetic fix -PT/OT following -Social work/case management following  Seizure Secondary to above  ICH, continue Keppra 500mg  BID  Aspiration pneumonia Dysphagia Sputum culture 09/11/2019 with normal oral flora.  Completed 7-day course of antibiotics during initial hospitalization. PEG tube placed for dysphagia.  Speech therapy was following and signed off on 11/24/2019 secondary to continued behaviors of refusal of speech therapy services. -Continue tube feeds per nutrition  Klebsiella oxytoca UTI -Completed course of cefazolin  Essential hypertension -Stable, continue amlodipine, hydralazine, lopressor   Severe protein calorie malnutrition -Continue tube feeds  Stage 2 CKD:  Creatinine at baseline, around 1.5. Check BMP intermittently.   Mild anemia of chronic disease: Hemoglobin stable. Check CBC intermittently.    DVT prophylaxis: Lovenox Code Status: Full Family Communication: None at bedside Disposition Plan: Difficult placement     Antimicrobials:  Anti-infectives (From admission, onward)   Start     Dose/Rate Route Frequency Ordered Stop   09/27/19 1130  cephALEXin (KEFLEX) 250 MG/5ML suspension 500 mg     500 mg Per Tube Every 12 hours 09/27/19 1052 09/29/19 2250   09/25/19 1445  ceFAZolin (ANCEF) IVPB 2g/100 mL premix  Status:  Discontinued     2 g 200 mL/hr over 30 Minutes Intravenous Every 12 hours 09/25/19 1417 09/27/19 1052   09/14/19 1000  Ampicillin-Sulbactam (UNASYN) 3 g in sodium chloride 0.9 % 100 mL IVPB     3 g 200 mL/hr over 30 Minutes Intravenous Every 8 hours 09/14/19 0922 09/17/19 1816   09/13/19 1100  vancomycin (VANCOCIN) IVPB 750 mg/150 ml premix  Status:  Discontinued     750 mg 150 mL/hr over 60 Minutes Intravenous Every 24 hours 09/13/19 0728 09/13/19 0838   09/12/19 1100  vancomycin (VANCOCIN) IVPB 1000 mg/200 mL premix  Status:  Discontinued     1,000 mg 200 mL/hr over 60 Minutes Intravenous Every  24 hours 09/11/19 1034 09/13/19 0728   09/11/19 1130  piperacillin-tazobactam (ZOSYN) IVPB 3.375 g  Status:  Discontinued     3.375 g  12.5 mL/hr over 240 Minutes Intravenous Every 8 hours 09/11/19 1054 09/14/19 0845   09/11/19 1100  vancomycin (VANCOCIN) 1,500 mg in sodium chloride 0.9 % 500 mL IVPB     1,500 mg 250 mL/hr over 120 Minutes Intravenous  Once 09/11/19 1034 09/11/19 1900   09/11/19 1015  piperacillin-tazobactam (ZOSYN) IVPB 4.5 g  Status:  Discontinued     4.5 g 200 mL/hr over 30 Minutes Intravenous Every 8 hours 09/11/19 1010 09/11/19 1052   09/09/19 0845  ceFAZolin (ANCEF) IVPB 2g/100 mL premix     2 g 200 mL/hr over 30 Minutes Intravenous Every 8 hours 09/09/19 0842 09/09/19 1658       Objective: Vitals:   12/04/19 2336 12/05/19 0335 12/05/19 0547 12/05/19 0852  BP: 107/75 107/84 (!) 128/94 (!) 122/97  Pulse: 68 88 87 86  Resp:  18  16  Temp:  98.7 F (37.1 C)  98.5 F (36.9 C)  TempSrc:  Oral  Oral  SpO2:  99% 100% 100%  Weight:  60.1 kg    Height:        Intake/Output Summary (Last 24 hours) at 12/05/2019 1007 Last data filed at 12/05/2019 0559 Gross per 24 hour  Intake -  Output 975 ml  Net -975 ml   Filed Weights   11/30/19 0322 12/03/19 0352 12/05/19 0335  Weight: 63.1 kg 63.4 kg 60.1 kg    Examination: General exam: Appears calm and comfortable    Data Reviewed: I have personally reviewed following labs and imaging studies  CBC: Recent Labs  Lab 11/29/19 0651  WBC 5.8  HGB 11.5*  HCT 36.4*  MCV 84.8  PLT [id number]   Basic Metabolic Panel: Recent Labs  Lab 11/29/19 0651  NA 137  K 4.5  CL 98  CO2 26  GLUCOSE 104*  BUN 34*  CREATININE 1.40*  CALCIUM 10.2   GFR: Estimated Creatinine Clearance: 60.2 mL/min (A) (by C-G formula based on SCr of 1.4 mg/dL (H)). Liver Function Tests: No results for input(s): AST, ALT, ALKPHOS, BILITOT, PROT, ALBUMIN in the last 168 hours. No results for input(s): LIPASE, AMYLASE in the last 168 hours. No results for input(s): AMMONIA in the last 168 hours. Coagulation Profile: No results for input(s): INR, PROTIME in the last 168  hours. Cardiac Enzymes: No results for input(s): CKTOTAL, CKMB, CKMBINDEX, TROPONINI in the last 168 hours. BNP (last 3 results) No results for input(s): PROBNP in the last 8760 hours. HbA1C: No results for input(s): HGBA1C in the last 72 hours. CBG: Recent Labs  Lab 12/04/19 1641 12/04/19 1935 12/05/19 0038 12/05/19 0341 12/05/19 0855  GLUCAP 118* 111* 114* 100* 96   Lipid Profile: No results for input(s): CHOL, HDL, LDLCALC, TRIG, CHOLHDL, LDLDIRECT in the last 72 hours. Thyroid Function Tests: No results for input(s): TSH, T4TOTAL, FREET4, T3FREE, THYROIDAB in the last 72 hours. Anemia Panel: No results for input(s): VITAMINB12, FOLATE, FERRITIN, TIBC, IRON, RETICCTPCT in the last 72 hours. Sepsis Labs: No results for input(s): PROCALCITON, LATICACIDVEN in the last 168 hours.  No results found for this or any previous visit (from the past 240 hour(s)).    Radiology Studies: No results found.    Scheduled Meds: . amLODipine  10 mg Per Tube Daily  . chlorhexidine  15 mL Mouth Rinse BID  . enoxaparin (LOVENOX) injection  40 mg  Subcutaneous Daily  . feeding supplement (OSMOLITE 1.2 CAL)  474 mL Per Tube QID  . feeding supplement (PRO-STAT SUGAR FREE 64)  30 mL Per Tube BID  . folic acid  1 mg Per Tube Daily  . free water  300 mL Per Tube Q4H  . hydrALAZINE  10 mg Per Tube Q8H  . insulin aspart  0-15 Units Subcutaneous Q4H  . levETIRAcetam  500 mg Per Tube BID  . mouth rinse  15 mL Mouth Rinse q12n4p  . metoprolol tartrate  100 mg Per Tube BID  . multivitamin  15 mL Per Tube Daily  . pantoprazole sodium  40 mg Per Tube BID  . QUEtiapine  50 mg Per Tube QHS  . sodium chloride flush  10-40 mL Intracatheter Q12H  . thiamine  100 mg Per Tube Daily   Continuous Infusions:   LOS: 87 days      Time spent: 10 minutes   Dessa Phi, DO [facility] 12/05/2019, 10:07 AM   Available via Epic secure chat 7am-7pm After these hours, please refer to coverage  provider listed on [email or web address]

## 2019-12-05 NOTE — Progress Notes (Signed)
Occupational Therapy Treatment Patient Details Name: Joshua [name] MRN: [id number] DOB: [birth year] Today's Date: 12/05/2019    History of present illness Pt is a 38 y.o. 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 S/p L frontoparietal craniectomy for evacuation of L basal ganglia hematoma 9/12. ETT 9/12-9/18, 9/18-9/23. PEG tube placed 9/28 secondary to dysphagia. PMH includes alcohol abuse, seizures, HTN.   OT comments  Pt seen by OT and PT this date to progress toward established OT goals. Pt actively reached for washcloth with LUE and resisted assistance from therapist to wash face. Pt continues to appear fearful of moving, gripping on to blankets/bed rails/etc and not releasing grip, pt with significant posterior lean sitting EOB. He required maxA+2 to progress to EOB and required maxA to maintain stability. Use of sara plus with foot plate removed today to progress pt into standing and facilitate body awareness during weight shifting as precursor to ADL. Pt required maxA+2 for use of sara plus and required hands on facilitation for knee extension and upright trunk position. Pt will continue to benefit from skilled OT services to maximize safety and independence with ADL/IADL and functional mobility. Will continue to follow acutely and progress as tolerated.    Follow Up Recommendations  SNF;Supervision/Assistance - 24 hour    Equipment Recommendations  None recommended by OT    Recommendations for Other Services Rehab consult    Precautions / Restrictions Precautions Precautions: Fall Precaution Comments: L crani, PEG, L hand soft mitt, helmet Required Braces or Orthoses: Splint/Cast Splint/Cast: resting hand splint and elbow splint for R UE due synergistic pattern Restrictions Weight Bearing Restrictions: No       Mobility Bed Mobility Overal bed mobility: Needs Assistance Bed Mobility: Supine to Sit Rolling: Max assist   Supine  to sit: Max assist;+2 for physical assistance     General bed mobility comments: Max A + 2 due to patient not following commands and physically resisting  Pt pushing posterior once in sitting.  Placed in Strum + sit to stand frame and able to hold to hand grip with left hand,  Transfers Overall transfer level: Needs assistance Equipment used: Ambulation equipment used(sara +) Transfers: Sit to/from Stand Sit to Stand: Max assist;+2 physical assistance         General transfer comment: Pt initiating movement to and from seated surface.  Pt required assistance in standing for weight shifting and for facilitation on knee flexion and extension.  Facilitation also provided for head and upper trunk control.  Pt participating in head control invoulntarily at time.  He continues to respond to nxious stimuli.  Pt stood in frame x 15 min.    Balance Overall balance assessment: Needs assistance Sitting-balance support: Feet supported;Single extremity supported Sitting balance-Leahy Scale: Poor Sitting balance - Comments: Posterior push. Postural control: Right lateral lean   Standing balance-Leahy Scale: Poor Standing balance comment: assistance to facilitate pelvic tilt and weight shift L to R to midline;hands on assistance to facilitate bilateral knee extension in standing                           ADL either performed or assessed with clinical judgement   ADL Overall ADL's : Needs assistance/impaired Eating/Feeding: NPO   Grooming: Maximal assistance Grooming Details (indicate cue type and reason): hand over hand assistance to attempt to wash face, resisted therapist, brought hand to mouth  Toilet Transfer: Maximal assistance;+2 for physical assistance;+2 for safety/equipment Toilet Transfer Details (indicate cue type and reason): simulated using sara plus to stand pt  Toileting- Clothing Manipulation and Hygiene: Total assistance       Functional  mobility during ADLs: Maximal assistance;+2 for physical assistance;+2 for safety/equipment;Total assistance General ADL Comments: initiated use of LUE to touch mouth and scratch nose     Vision   Vision Assessment?: Vision impaired- to be further tested in functional context;Yes Additional Comments: pt does not sustain eye contact with therapist, will intermittently look at therapist when talking;eyes continue to remain in left visual field   Perception     Praxis      Cognition Arousal/Alertness: Awake/alert Behavior During Therapy: Flat affect;Restless Overall Cognitive Status: Impaired/Different from baseline Area of Impairment: Attention;Following commands;Safety/judgement;Awareness;Problem solving                   Current Attention Level: Sustained Memory: Decreased short-term memory Following Commands: Follows one step commands inconsistently(unsure if he is actually following commands) Safety/Judgement: Decreased awareness of safety;Decreased awareness of deficits Awareness: Intellectual Problem Solving: Slow processing;Decreased initiation;Difficulty sequencing;Requires verbal cues;Requires tactile cues General Comments: physically resistant and perseverating on holding onto blankets and bed rails        Exercises     Shoulder Instructions       General Comments      Pertinent Vitals/ Pain       Pain Assessment: Faces Faces Pain Scale: Hurts little more Pain Location: general discomfort with mobility, noted also during head and neck movements/stretching Pain Descriptors / Indicators: Guarding;Grimacing Pain Intervention(s): Limited activity within patient's tolerance;Monitored during session;Repositioned  Home Living                                          Prior Functioning/Environment              Frequency  Min 2X/week        Progress Toward Goals  OT Goals(current goals can now be found in the care plan section)   Progress towards OT goals: Progressing toward goals  Acute Rehab OT Goals Patient Stated Goal: none stated OT Goal Formulation: Patient unable to participate in goal setting Time For Goal Achievement: 12/19/19 Potential to Achieve Goals: Good ADL Goals Pt Will Perform Grooming: sitting;with mod assist Pt/caregiver will Perform Home Exercise Program: Increased ROM;Increased strength;Right Upper extremity;With minimal assist Additional ADL Goal #1: Pt will maintain static balance sitting EOB with modA to increase independence with ADL task. Additional ADL Goal #2: Pt will track towards R visual field with no more than mod cues. Additional ADL Goal #3: Pt will follow one step commands with 50% accuracy during functional task.  Plan Discharge plan remains appropriate    Co-evaluation    PT/OT/SLP Co-Evaluation/Treatment: Yes Reason for Co-Treatment: Complexity of the patient's impairments (multi-system involvement);For patient/therapist safety;To address functional/ADL transfers PT goals addressed during session: Mobility/safety with mobility OT goals addressed during session: ADL's and self-care      AM-PAC OT "6 Clicks" Daily Activity     Outcome Measure   Help from another person eating meals?: Total Help from another person taking care of personal grooming?: A Lot Help from another person toileting, which includes using toliet, bedpan, or urinal?: A Lot Help from another person bathing (including washing, rinsing, drying)?: Total Help from another person to put on and taking off  regular upper body clothing?: Total Help from another person to put on and taking off regular lower body clothing?: Total 6 Click Score: 8    End of Session Equipment Utilized During Treatment: Gait belt;Other (comment)(resting hand splint, elbow splint, sara plus)  OT Visit Diagnosis: Other abnormalities of gait and mobility (R26.89);Hemiplegia and hemiparesis;Cognitive communication deficit  (R41.841) Symptoms and signs involving cognitive functions: Other Nontraumatic ICH Hemiplegia - Right/Left: Right Hemiplegia - caused by: Other Nontraumatic intracranial hemorrhage   Activity Tolerance Patient tolerated treatment well   Patient Left with nursing/sitter in room(in wheelchair at front desk with RN)   Nurse Communication Mobility status        Time: [id number] OT Time Calculation (min): 48 min  Charges: OT General Charges $OT Visit: 1 Visit OT Treatments $Self Care/Home Management : 23-37 mins  Dorinda Hill OTR/L Acute Rehabilitation Services Office: [place] 12/05/2019, 4:24 PM

## 2019-12-06 LAB — GLUCOSE, CAPILLARY
Glucose-Capillary: 100 mg/dL — ABNORMAL HIGH (ref 70–99)
Glucose-Capillary: 106 mg/dL — ABNORMAL HIGH (ref 70–99)
Glucose-Capillary: 117 mg/dL — ABNORMAL HIGH (ref 70–99)
Glucose-Capillary: 119 mg/dL — ABNORMAL HIGH (ref 70–99)
Glucose-Capillary: 90 mg/dL (ref 70–99)
Glucose-Capillary: 95 mg/dL (ref 70–99)
Glucose-Capillary: 95 mg/dL (ref 70–99)

## 2019-12-06 NOTE — Plan of Care (Signed)
  Problem: Education: Goal: Knowledge of General Education information will improve Description: Including pain rating scale, medication(s)/side effects and non-pharmacologic comfort measures Outcome: Progressing   Problem: Health Behavior/Discharge Planning: Goal: Ability to manage health-related needs will improve Outcome: Progressing   Problem: Clinical Measurements: Goal: Will remain free from infection Outcome: Progressing Goal: Diagnostic test results will improve Outcome: Progressing Goal: Respiratory complications will improve Outcome: Progressing Goal: Cardiovascular complication will be avoided Outcome: Progressing   Problem: Activity: Goal: Risk for activity intolerance will decrease Outcome: Progressing   Problem: Nutrition: Goal: Adequate nutrition will be maintained Outcome: Progressing   Problem: Coping: Goal: Level of anxiety will decrease Outcome: Progressing   Problem: Elimination: Goal: Will not experience complications related to bowel motility Outcome: Progressing Goal: Will not experience complications related to urinary retention Outcome: Progressing   Problem: Pain Managment: Goal: General experience of comfort will improve Outcome: Progressing   Problem: Safety: Goal: Ability to remain free from injury will improve Outcome: Progressing   Problem: Skin Integrity: Goal: Risk for impaired skin integrity will decrease Outcome: Progressing   Problem: Clinical Measurements: Goal: Usual level of consciousness will be regained or maintained. Outcome: Progressing Goal: Neurologic status will improve Outcome: Progressing Goal: Ability to maintain intracranial pressure will improve Outcome: Progressing   Problem: Skin Integrity: Goal: Demonstration of wound healing without infection will improve Outcome: Progressing   Problem: Education: Goal: Knowledge of disease or condition will improve Outcome: Progressing Goal: Knowledge of secondary  prevention will improve Outcome: Progressing Goal: Knowledge of patient specific risk factors addressed and post discharge goals established will improve Outcome: Progressing Goal: Individualized Educational Video(s) Outcome: Progressing   Problem: Coping: Goal: Will verbalize positive feelings about self Outcome: Progressing Goal: Will identify appropriate support needs Outcome: Progressing   Problem: Health Behavior/Discharge Planning: Goal: Ability to manage health-related needs will improve Outcome: Progressing   Problem: Self-Care: Goal: Ability to participate in self-care as condition permits will improve Outcome: Progressing Goal: Ability to communicate needs accurately will improve Outcome: Progressing   Problem: Nutrition: Goal: Risk of aspiration will decrease Outcome: Progressing Goal: Dietary intake will improve Outcome: Progressing   Problem: Intracerebral Hemorrhage Tissue Perfusion: Goal: Complications of Intracerebral Hemorrhage will be minimized Outcome: Progressing   Ival Bible, BSN, RN

## 2019-12-06 NOTE — Progress Notes (Signed)
[facility]  PROGRESS NOTE  Joshua [name] [id number] DOB: 13-Aug-1981 DOA: 09/09/2019 PCP: Patient, [name] Admit date - 09/09/2019   Admitting Physician Erline Levine, MD  Outpatient Primary MD for the patient is Patient, [name]  LOS - 86 Brief Narrative   Joshua [name] is a 38 y.o. year old male with medical history significant for HTN, poorly controlled, alcoholism who presented on 09/09/2019.  Per chart review patient was at a smoking establishment and had witnessed "seizure like activity" for a few minutes followed by confusion afterwards.  On EMS arrival patient is found to have bowel/urine incontinence, no right upper or lower extremity movement, nonverbal, and not following commands with intermittent vomiting.  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    Hospital course complicated by rapidly declining neurologic exam (enlarged left pupil, right-sided hemiplegia, unconscious) patient underwent left craniotomy with decompressive craniectomy and evacuation of ICH by neurosurgery on 09/09/2019.  PEG tube placed on 9/28 secondary to dysphagia.  Subjective  [name] today has no acute events overnight.  Patient took down cover upon me entering the room and saying hello otherwise not follow much in terms of commands.  Remains nonverbal  A & P   1. Left hemispheric ICH.  Status post decompressive craniectomy and evacuation .  Dr. Vertell Limber (12/4) does not recommend surgery for flap replacement given patient's current clinical state, unless patient becomes more mobile.  Remains nonverbal with dense right-sided hemiplegia, able to move left side.  -PT/OT recommends SNF  -Continue supportive care, Seroquel  2. Seizures, secondary to above, stable  -Continue Keppra 500 mg twice daily  3. Aspiration pneumonia.  Currently n.p.o. given  dysphagia  -All nutrition via PEG tube  -Completed 7-day course of antibiotic (Zosyn, then Unasyn).  -Speech therapy signed off due to patient continued to refuse services  4. CKD stage IV.  Last Cr 1.4, normal output  -Check BMP  5. HTN, currently at goal. -Continue P 10 mg, metoprolol 100 mg twice daily, hydralazine 10 mg 3 times daily.  6. Severe protein calorie malnutrition  -Continue tube feeds  7. Mild anemia of CKD, hemoglobin has remained stable prior  - repeat CBC,   8. History of alcoholism Completed clonidine taper for presumed alcohol withdrawal earlier in hospital stay  -Continue folate     Family Communication  :  Called and spoke to mother Joshua [name] at [phone].  Code Status : Full code  Disposition Plan  : Medically stable, still awaiting bed offers per case management for rehab stay at skilled nursing facility as patient is dependent on total care given dependency for all ADLs patient is now completely disabled, continue to require feeding via PEG tube, unable to make needs known given nonverbal.  Consults  : Neurology, PCCM, neurosurgery, surgery   Procedures  :    9/12 intubation  9/12 craniotomy  9/18 extubation  9/18 re-intubation  9/23 extubation  10/1 EGD PEG placement  DVT Prophylaxis  :  Lovenox   Lab Results  Component Value Date   PLT 289 11/29/2019    Diet :  Diet Order            Diet NPO time specified  Diet effective midnight               Inpatient Medications Scheduled Meds: . amLODipine  10 mg Per Tube  Daily  . chlorhexidine  15 mL Mouth Rinse BID  . enoxaparin (LOVENOX) injection  40 mg Subcutaneous Daily  . feeding supplement (OSMOLITE 1.2 CAL)  474 mL Per Tube QID  . feeding supplement (PRO-STAT SUGAR FREE 64)  30 mL Per Tube BID  . folic acid  1 mg Per Tube Daily  . free water  300 mL Per Tube Q4H  . hydrALAZINE  10 mg Per Tube Q8H  . insulin aspart  0-15 Units Subcutaneous Q4H  . levETIRAcetam  500 mg  Per Tube BID  . mouth rinse  15 mL Mouth Rinse q12n4p  . metoprolol tartrate  100 mg Per Tube BID  . multivitamin  15 mL Per Tube Daily  . pantoprazole sodium  40 mg Per Tube BID  . QUEtiapine  50 mg Per Tube QHS  . sodium chloride flush  10-40 mL Intracatheter Q12H  . thiamine  100 mg Per Tube Daily   Continuous Infusions: PRN Meds:.acetaminophen **OR** acetaminophen, bisacodyl, glycopyrrolate, lip balm, [DISCONTINUED] ondansetron **OR** ondansetron (ZOFRAN) IV, polyethylene glycol, promethazine, sodium chloride flush  Antibiotics  :   Anti-infectives (From admission, onward)   Start     Dose/Rate Route Frequency Ordered Stop   09/27/19 1130  cephALEXin (KEFLEX) 250 MG/5ML suspension 500 mg     500 mg Per Tube Every 12 hours 09/27/19 1052 09/29/19 2250   09/25/19 1445  ceFAZolin (ANCEF) IVPB 2g/100 mL premix  Status:  Discontinued     2 g 200 mL/hr over 30 Minutes Intravenous Every 12 hours 09/25/19 1417 09/27/19 1052   09/14/19 1000  Ampicillin-Sulbactam (UNASYN) 3 g in sodium chloride 0.9 % 100 mL IVPB     3 g 200 mL/hr over 30 Minutes Intravenous Every 8 hours 09/14/19 0922 09/17/19 1816   09/13/19 1100  vancomycin (VANCOCIN) IVPB 750 mg/150 ml premix  Status:  Discontinued     750 mg 150 mL/hr over 60 Minutes Intravenous Every 24 hours 09/13/19 0728 09/13/19 0838   09/12/19 1100  vancomycin (VANCOCIN) IVPB 1000 mg/200 mL premix  Status:  Discontinued     1,000 mg 200 mL/hr over 60 Minutes Intravenous Every 24 hours 09/11/19 1034 09/13/19 0728   09/11/19 1130  piperacillin-tazobactam (ZOSYN) IVPB 3.375 g  Status:  Discontinued     3.375 g 12.5 mL/hr over 240 Minutes Intravenous Every 8 hours 09/11/19 1054 09/14/19 0845   09/11/19 1100  vancomycin (VANCOCIN) 1,500 mg in sodium chloride 0.9 % 500 mL IVPB     1,500 mg 250 mL/hr over 120 Minutes Intravenous  Once 09/11/19 1034 09/11/19 1900   09/11/19 1015  piperacillin-tazobactam (ZOSYN) IVPB 4.5 g  Status:  Discontinued     4.5  g 200 mL/hr over 30 Minutes Intravenous Every 8 hours 09/11/19 1010 09/11/19 1052   09/09/19 0845  ceFAZolin (ANCEF) IVPB 2g/100 mL premix     2 g 200 mL/hr over 30 Minutes Intravenous Every 8 hours 09/09/19 0842 09/09/19 1658       Objective   Vitals:   12/06/19 0500 12/06/19 0500 12/06/19 0811 12/06/19 1319  BP:  (!) 138/105 114/88 (!) 145/109  Pulse:  90 80 85  Resp:  18 18 16   Temp:  97.8 F (36.6 C) 97.8 F (36.6 C) 98.3 F (36.8 C)  TempSrc:  Oral Axillary Oral  SpO2:  100% 100% 100%  Weight: 62.5 kg     Height:        SpO2: 100 % O2 Flow Rate (L/min): 0 L/min FiO2 (%):  40 %  Wt Readings from Last 3 Encounters:  12/06/19 62.5 kg     Intake/Output Summary (Last 24 hours) at 12/06/2019 1353 Last data filed at 12/06/2019 0504 Gross per 24 hour  Intake -  Output 726 ml  Net -726 ml    Physical Exam:  Awake Thin frail male Right-sided hemiplegia, moving left upper and lower extremities without deficits Opens eyes to voice, otherwise not following commands No new F.N deficits,  .AT, No JVD Symmetrical Chest wall movement, Good air movement bilaterally, CTAB on room air RRR,No Gallops,Rubs or new Murmurs,  +ve B.Sounds, Abd Soft, No tenderness, No organomegaly appreciated, No rebound, guarding or rigidity. No Cyanosis, Clubbing or edema, No new Rash or bruise     I have personally reviewed the following:   Data Reviewed:  CBC No results for input(s): WBC, HGB, HCT, PLT, MCV, MCH, MCHC, RDW, LYMPHSABS, MONOABS, EOSABS, BASOSABS, BANDABS in the last 168 hours.  Invalid input(s): NEUTRABS, BANDSABD  Chemistries  No results for input(s): NA, K, CL, CO2, GLUCOSE, BUN, CREATININE, CALCIUM, MG, AST, ALT, ALKPHOS, BILITOT in the last 168 hours.  Invalid input(s): GFRCGP ------------------------------------------------------------------------------------------------------------------ No results for input(s): CHOL, HDL, LDLCALC, TRIG, CHOLHDL, LDLDIRECT in  the last 72 hours.  Lab Results  Component Value Date   HGBA1C 5.6 09/10/2019   ------------------------------------------------------------------------------------------------------------------ No results for input(s): TSH, T4TOTAL, T3FREE, THYROIDAB in the last 72 hours.  Invalid input(s): FREET3 ------------------------------------------------------------------------------------------------------------------ No results for input(s): VITAMINB12, FOLATE, FERRITIN, TIBC, IRON, RETICCTPCT in the last 72 hours.  Coagulation profile No results for input(s): INR, PROTIME in the last 168 hours.  No results for input(s): DDIMER in the last 72 hours.  Cardiac Enzymes No results for input(s): CKMB, TROPONINI, MYOGLOBIN in the last 168 hours.  Invalid input(s): CK ------------------------------------------------------------------------------------------------------------------ No results found for: BNP  Micro Results No results found for this or any previous visit (from the past 240 hour(s)).  Radiology Reports No results found.   Time Spent in minutes  30     Desiree Hane M.D on 12/06/2019 at 1:53 PM  To page go to [email or web address] - password [email or web address]

## 2019-12-07 LAB — GLUCOSE, CAPILLARY
Glucose-Capillary: 90 mg/dL (ref 70–99)
Glucose-Capillary: 106 mg/dL — ABNORMAL HIGH (ref 70–99)
Glucose-Capillary: 132 mg/dL — ABNORMAL HIGH (ref 70–99)
Glucose-Capillary: 96 mg/dL (ref 70–99)
Glucose-Capillary: 108 mg/dL — ABNORMAL HIGH (ref 70–99)

## 2019-12-07 NOTE — Progress Notes (Signed)
Nutrition Follow-up  DOCUMENTATION CODES:   Severe malnutrition in context of acute illness/injury  INTERVENTION:  Continue bolus tube feeds using Osmolite 1.2 formula via PEG at goal volume of 474 ml (2 cartons/ARCs) given QID.   Provide 30 ml Prostat BID per tube.   Free water flushes of 300 ml every 4 hours per tube. (MD to adjust as appropriate)  Tube feeding to provide 2475 kcal, 135 grams of protein, and 3355 ml free water.  NUTRITION DIAGNOSIS:   Severe Malnutrition related to acute illness(intracerebral hemorrage) as evidenced by severe fat depletion, severe muscle depletion; ongoing  GOAL:   Patient will meet greater than or equal to 90% of their needs; met with TF  MONITOR:   TF tolerance, Skin, Weight trends, Labs, I & O's  REASON FOR ASSESSMENT:   Ventilator, Consult Enteral/tube feeding initiation and management  ASSESSMENT:   38 yo male admitted with large left intracranial hemorrhage; S/P craniotomy. PMH includes HTN, alcoholism, noncompliance.  9/12left frontal temporal craniectomy and evacuation hematoma.  9/23 Extubated.  10/1 PEG placed 11/23- Nutrition Focused Physical Exam performed, severe subcutaneous fat loss and severe muscle wasting identified. 11/28- SLP signed off, pt refusing oral care and po intake  Pt has been tolerating his bolus tube feeds well with no difficulties. RD to continue with current orders and monitor for tolerance. Labs and medications reviewed. Pt remains on difficult long term placement for disposition plan.   Diet Order:   Diet Order            Diet NPO time specified  Diet effective midnight              EDUCATION NEEDS:   Not appropriate for education at this time  Skin:  Skin Assessment: Reviewed RN Assessment  Last BM:  12/4  Height:   Ht Readings from Last 1 Encounters:  09/09/19 _0  (1.753 m)    Weight:   Wt Readings from Last 1 Encounters:  12/07/19 59.1 kg    Ideal Body Weight:   72.7 kg  BMI:  Body mass index is 19.24 kg/m.  Estimated Nutritional Needs:   Kcal:  2100-2400  Protein:  105-125 grams  Fluid:  >/= 2 L/day    Corrin Parker, MS, RD, LDN Pager # [phone] After hours/ weekend pager # [phone]

## 2019-12-07 NOTE — Progress Notes (Signed)
Physical Therapy Treatment Patient Details Name: Joshua [name] MRN: [id number] DOB: 1981/12/23 Today's Date: 12/07/2019    History of Present Illness Pt is a 38 y.o. 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 S/p L frontoparietal craniectomy for evacuation of L basal ganglia hematoma 9/12. ETT 9/12-9/18, 9/18-9/23. PEG tube placed 9/28 secondary to dysphagia. PMH includes alcohol abuse, seizures, HTN.    PT Comments    Pt supine in bed on arrival and soiled.  Pt required assistance to roll and place new bed pads during pericare.  Post rolling attempted to have patient wash his hands and face and brush his teeth. He did bring the wash cloth to his mouth to wash his face and did attempt to initiate washing his hand.  He remains resistant to oral care.  Gums bleeding when his brother attempted to brush his teeth.  Attempted LE strengthening and he was resistive.  Pt did tolerate gentle ROM/stretching to RUE.  Plan for OOB next session. Continue to recommend SNF placement.    Follow Up Recommendations  SNF     Equipment Recommendations  Wheelchair (measurements PT);Wheelchair cushion (measurements PT);Hospital bed;3in1 (PT)    Recommendations for Other Services Rehab consult     Precautions / Restrictions Precautions Precautions: Fall Precaution Comments: L crani, PEG, L hand soft mitt, helmet Required Braces or Orthoses: Splint/Cast Splint/Cast: resting hand splint and elbow splint for R UE due synergistic pattern Restrictions Weight Bearing Restrictions: No    Mobility  Bed Mobility Overal bed mobility: Needs Assistance Bed Mobility: Rolling Rolling: Max assist;+2 for physical assistance(+1 to roll to R and +1 to roll to the L.)         General bed mobility comments: Performed rolling on arrival to clean pt of bowel incontinence.  He required increased assistance to roll to the L.  Transfers                     Ambulation/Gait                 Stairs             Wheelchair Mobility    Modified Rankin (Stroke Patients Only)       Balance                                            Cognition Arousal/Alertness: Awake/alert Behavior During Therapy: Flat affect;Restless Overall Cognitive Status: Impaired/Different from baseline Area of Impairment: Attention;Following commands;Safety/judgement;Awareness;Problem solving                   Current Attention Level: Sustained Memory: Decreased short-term memory Following Commands: Follows one step commands inconsistently Safety/Judgement: Decreased awareness of safety;Decreased awareness of deficits Awareness: Intellectual Problem Solving: Slow processing;Decreased initiation;Difficulty sequencing;Requires verbal cues;Requires tactile cues General Comments: physically resistant and perseverating on holding onto blankets and bed rails.  As tx progressed he did follow commands to wash his face and he would push tooth brush away from his face.      Exercises Other Exercises Other Exercises: Stretching to R bicep tone with elbow extension 1x5 reps with 3x5 sec hold.  Attempted LE and he resisted and swatted hand away    General Comments        Pertinent Vitals/Pain Pain Assessment: Faces Faces Pain Scale: Hurts even more Pain Location: R  UE with elbow extension Pain Descriptors / Indicators: Guarding Pain Intervention(s): Monitored during session;Repositioned    Home Living                      Prior Function            PT Goals (current goals can now be found in the care plan section) Acute Rehab PT Goals Patient Stated Goal: none stated Potential to Achieve Goals: Fair Progress towards PT goals: Progressing toward goals    Frequency    Min 2X/week      PT Plan Current plan remains appropriate    Co-evaluation              AM-PAC PT "6 Clicks" Mobility    Outcome Measure  Help needed turning from your back to your side while in a flat bed without using bedrails?: Total Help needed moving from lying on your back to sitting on the side of a flat bed without using bedrails?: Total Help needed moving to and from a bed to a chair (including a wheelchair)?: Total Help needed standing up from a chair using your arms (e.g., wheelchair or bedside chair)?: Total Help needed to walk in hospital room?: Total Help needed climbing 3-5 steps with a railing? : Total 6 Click Score: 6    End of Session Equipment Utilized During Treatment: Gait belt Activity Tolerance: Patient tolerated treatment well Patient left: in bed;with call bell/phone within reach;with bed alarm set;with family/visitor present(Brother lucky present at bedside.) Nurse Communication: Mobility status PT Visit Diagnosis: Muscle weakness (generalized) (M62.81);Hemiplegia and hemiparesis;Other abnormalities of gait and mobility (R26.89) Hemiplegia - Right/Left: Right Hemiplegia - dominant/non-dominant: Dominant Hemiplegia - caused by: Nontraumatic intracerebral hemorrhage     Time: 1723-1803 PT Time Calculation (min) (ACUTE ONLY): 40 min  Charges:  $Therapeutic Activity: 38-52 mins                     [name] , PTA Acute Rehabilitation Services Pager [phone] Office [phone]     Cristela Blue 12/07/2019, 6:44 PM

## 2019-12-07 NOTE — Progress Notes (Signed)
[facility]  PROGRESS NOTE  [name] [id number] DOB: 1981/05/24 DOA: 09/09/2019 PCP: Patient, [name] Admit date - 09/09/2019   Admitting Physician Erline Levine, MD  Outpatient Primary MD for the patient is Patient, [name]  LOS - 32 Brief Narrative   Joshua [name] is a 38 y.o. year old male with medical history significant for HTN, poorly controlled, alcoholism who presented on 09/09/2019.  Per chart review patient was at a smoking establishment and had witnessed "seizure like activity" for a few minutes followed by confusion afterwards.  On EMS arrival patient is found to have bowel/urine incontinence, no right upper or lower extremity movement, nonverbal, and not following commands with intermittent vomiting.  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    Hospital course complicated by rapidly declining neurologic exam (enlarged left pupil, right-sided hemiplegia, unconscious) patient underwent left craniotomy with decompressive craniectomy and evacuation of ICH by neurosurgery on 09/09/2019.  PEG tube placed on 9/28 secondary to dysphagia.  Subjective  Mr.  Seppi today has no acute events overnight.  Remains nonverbal  A & P   1. Left hemispheric ICH.  Status post decompressive craniectomy and evacuation .  Dr. Vertell Limber (12/4) does not recommend surgery for flap replacement given patient's current clinical state, unless patient becomes more mobile.  Remains nonverbal with dense right-sided hemiplegia, able to move left side.  -PT/OT recommends SNF  -Continue supportive care, Seroquel  2. Seizures, secondary to above, stable  -Continue Keppra 500 mg twice daily  3. Aspiration pneumonia.  Currently n.p.o. given dysphagia  -All nutrition via PEG tube  -Completed 7-day course of antibiotic (Zosyn, then Unasyn).  -Speech therapy  signed off due to patient continued to refuse services  4. CKD stage IV.  Last Cr 1.4, normal output  -Check BMP  5. HTN, currently at goal. -Continue P 10 mg, metoprolol 100 mg twice daily, hydralazine 10 mg 3 times daily.  6. Severe protein calorie malnutrition  -Continue tube feeds  7. Mild anemia of CKD, hemoglobin has remained stable prior  - repeat CBC,   8. History of alcoholism Completed clonidine taper for presumed alcohol withdrawal earlier in hospital stay  -Continue folate     Family Communication  :  Called and spoke to mother Joshua [name] at [phone] on 12/06/19  Code Status : Full code  Disposition Plan  : Medically stable, still awaiting bed offers per case management for rehab stay at skilled nursing facility as patient is dependent on total care given dependency for all ADLs patient is now completely disabled, continue to require feeding via PEG tube, unable to make needs known given nonverbal.  Consults  : Neurology, PCCM, neurosurgery, surgery   Procedures  :    9/12 intubation  9/12 craniotomy  9/18 extubation  9/18 re-intubation  9/23 extubation  10/1 EGD PEG placement  DVT Prophylaxis  :  Lovenox   Lab Results  Component Value Date   PLT 289 11/29/2019    Diet :  Diet Order            Diet NPO time specified  Diet effective midnight               Inpatient Medications Scheduled Meds: . amLODipine  10 mg Per Tube Daily  . chlorhexidine  15 mL Mouth Rinse BID  . enoxaparin (LOVENOX) injection  40 mg Subcutaneous  Daily  . feeding supplement (OSMOLITE 1.2 CAL)  474 mL Per Tube QID  . feeding supplement (PRO-STAT SUGAR FREE 64)  30 mL Per Tube BID  . folic acid  1 mg Per Tube Daily  . free water  300 mL Per Tube Q4H  . hydrALAZINE  10 mg Per Tube Q8H  . insulin aspart  0-15 Units Subcutaneous Q4H  . levETIRAcetam  500 mg Per Tube BID  . mouth rinse  15 mL Mouth Rinse q12n4p  . metoprolol tartrate  100 mg Per Tube BID  .  multivitamin  15 mL Per Tube Daily  . pantoprazole sodium  40 mg Per Tube BID  . QUEtiapine  50 mg Per Tube QHS  . sodium chloride flush  10-40 mL Intracatheter Q12H  . thiamine  100 mg Per Tube Daily   Continuous Infusions: PRN Meds:.acetaminophen **OR** acetaminophen, bisacodyl, glycopyrrolate, lip balm, [DISCONTINUED] ondansetron **OR** ondansetron (ZOFRAN) IV, polyethylene glycol, promethazine, sodium chloride flush  Antibiotics  :   Anti-infectives (From admission, onward)   Start     Dose/Rate Route Frequency Ordered Stop   09/27/19 1130  cephALEXin (KEFLEX) 250 MG/5ML suspension 500 mg     500 mg Per Tube Every 12 hours 09/27/19 1052 09/29/19 2250   09/25/19 1445  ceFAZolin (ANCEF) IVPB 2g/100 mL premix  Status:  Discontinued     2 g 200 mL/hr over 30 Minutes Intravenous Every 12 hours 09/25/19 1417 09/27/19 1052   09/14/19 1000  Ampicillin-Sulbactam (UNASYN) 3 g in sodium chloride 0.9 % 100 mL IVPB     3 g 200 mL/hr over 30 Minutes Intravenous Every 8 hours 09/14/19 0922 09/17/19 1816   09/13/19 1100  vancomycin (VANCOCIN) IVPB 750 mg/150 ml premix  Status:  Discontinued     750 mg 150 mL/hr over 60 Minutes Intravenous Every 24 hours 09/13/19 0728 09/13/19 0838   09/12/19 1100  vancomycin (VANCOCIN) IVPB 1000 mg/200 mL premix  Status:  Discontinued     1,000 mg 200 mL/hr over 60 Minutes Intravenous Every 24 hours 09/11/19 1034 09/13/19 0728   09/11/19 1130  piperacillin-tazobactam (ZOSYN) IVPB 3.375 g  Status:  Discontinued     3.375 g 12.5 mL/hr over 240 Minutes Intravenous Every 8 hours 09/11/19 1054 09/14/19 0845   09/11/19 1100  vancomycin (VANCOCIN) 1,500 mg in sodium chloride 0.9 % 500 mL IVPB     1,500 mg 250 mL/hr over 120 Minutes Intravenous  Once 09/11/19 1034 09/11/19 1900   09/11/19 1015  piperacillin-tazobactam (ZOSYN) IVPB 4.5 g  Status:  Discontinued     4.5 g 200 mL/hr over 30 Minutes Intravenous Every 8 hours 09/11/19 1010 09/11/19 1052   09/09/19 0845   ceFAZolin (ANCEF) IVPB 2g/100 mL premix     2 g 200 mL/hr over 30 Minutes Intravenous Every 8 hours 09/09/19 0842 09/09/19 1658       Objective   Vitals:   12/07/19 0822 12/07/19 1124 12/07/19 1301 12/07/19 1611  BP: 116/82 122/86 123/87 106/74  Pulse: 74 84  91  Resp: 18 19  18   Temp: 98.1 F (36.7 C) 98.9 F (37.2 C)  97.7 F (36.5 C)  TempSrc: Oral Oral  Oral  SpO2: 100% 100%  100%  Weight:      Height:        SpO2: 100 % O2 Flow Rate (L/min): 0 L/min FiO2 (%): 40 %  Wt Readings from Last 3 Encounters:  12/07/19 59.1 kg    No intake or output data  in the 24 hours ending 12/07/19 1823  Physical Exam:  Awake Thin frail male Right-sided hemiplegia, moving left upper and lower extremities without deficits Opens eyes to voice, otherwise not following commands No new F.N deficits,  [place].AT, No JVD Symmetrical Chest wall movement, Good air movement bilaterally, CTAB on room air RRR,No Gallops,Rubs or new Murmurs,  Abdominal binder in place No Cyanosis, Clubbing or edema, No new Rash or bruise     I have personally reviewed the following:   Data Reviewed:  CBC No results for input(s): WBC, HGB, HCT, PLT, MCV, MCH, MCHC, RDW, LYMPHSABS, MONOABS, EOSABS, BASOSABS, BANDABS in the last 168 hours.  Invalid input(s): NEUTRABS, BANDSABD  Chemistries  No results for input(s): NA, K, CL, CO2, GLUCOSE, BUN, CREATININE, CALCIUM, MG, AST, ALT, ALKPHOS, BILITOT in the last 168 hours.  Invalid input(s): GFRCGP ------------------------------------------------------------------------------------------------------------------ No results for input(s): CHOL, HDL, LDLCALC, TRIG, CHOLHDL, LDLDIRECT in the last 72 hours.  Lab Results  Component Value Date   HGBA1C 5.6 09/10/2019   ------------------------------------------------------------------------------------------------------------------ No results for input(s): TSH, T4TOTAL, T3FREE, THYROIDAB in the last 72  hours.  Invalid input(s): FREET3 ------------------------------------------------------------------------------------------------------------------ No results for input(s): VITAMINB12, FOLATE, FERRITIN, TIBC, IRON, RETICCTPCT in the last 72 hours.  Coagulation profile No results for input(s): INR, PROTIME in the last 168 hours.  No results for input(s): DDIMER in the last 72 hours.  Cardiac Enzymes No results for input(s): CKMB, TROPONINI, MYOGLOBIN in the last 168 hours.  Invalid input(s): CK ------------------------------------------------------------------------------------------------------------------ No results found for: BNP  Micro Results No results found for this or any previous visit (from the past 240 hour(s)).  Radiology Reports No results found.   Time Spent in minutes  30     Desiree Hane M.D on 12/07/2019 at 6:23 PM  To page go to [email or web address] - password [email or web address]

## 2019-12-08 LAB — GLUCOSE, CAPILLARY
Glucose-Capillary: 106 mg/dL — ABNORMAL HIGH (ref 70–99)
Glucose-Capillary: 126 mg/dL — ABNORMAL HIGH (ref 70–99)
Glucose-Capillary: 134 mg/dL — ABNORMAL HIGH (ref 70–99)
Glucose-Capillary: 144 mg/dL — ABNORMAL HIGH (ref 70–99)
Glucose-Capillary: 68 mg/dL — ABNORMAL LOW (ref 70–99)
Glucose-Capillary: 89 mg/dL (ref 70–99)
Glucose-Capillary: 93 mg/dL (ref 70–99)
Glucose-Capillary: 95 mg/dL (ref 70–99)

## 2019-12-08 NOTE — Progress Notes (Signed)
SLP Cancellation Note  Patient Details Name: Joshua [name] MRN: [id number] DOB: 17-Feb-1981   Cancelled treatment:        SLP received order for BSE. This Probation officer communicated with pt's MD regarding previous course of ST services and most recently pt resistant to oral attempts by PT on (12/10). We both are in agreement that if pt is resistant to oral care today that ST services are indicated d/t high aspiration risk.  This Probation officer went by and made several attempts to engage pt in task by holding wash cloth or toothbrush. Pt held item but didn't bring ot mouth. Despite Max A cues/assistance, pt pushed everything away from mouth and didn't open mouth to spoon, washcloth or toothbrush.   BSE orders were discontinued as pt is not appropriate at this time.    Aaryn Parrilla 12/08/2019, 12:48 PM

## 2019-12-08 NOTE — Plan of Care (Signed)
  Problem: Education: Goal: Knowledge of General Education information will improve Description: Including pain rating scale, medication(s)/side effects and non-pharmacologic comfort measures Outcome: Not Met (add Reason)  Pt is nonverbal & does NOT follow commands. Problem: Health Behavior/Discharge Planning: Goal: Ability to manage health-related needs will improve Outcome: Not Met (add Reason)   Problem: Clinical Measurements: Goal: Will remain free from infection Outcome: Progressing Goal: Diagnostic test results will improve Outcome: Progressing Goal: Respiratory complications will improve Outcome: Progressing Goal: Cardiovascular complication will be avoided Outcome: Progressing   Problem: Activity: Goal: Risk for activity intolerance will decrease Outcome: Not Met (add Reason)   Problem: Nutrition: Goal: Adequate nutrition will be maintained Outcome: Progressing   Problem: Elimination: Goal: Will not experience complications related to bowel motility Outcome: Progressing Goal: Will not experience complications related to urinary retention Outcome: Progressing   Problem: Pain Managment: Goal: General experience of comfort will improve Outcome: Progressing   Problem: Safety: Goal: Ability to remain free from injury will improve Outcome: Progressing

## 2019-12-08 NOTE — Progress Notes (Signed)
Physical Therapy Treatment Patient Details Name: Joshua [name] MRN: [id number] DOB: 08-31-1981 Today's Date: 12/08/2019    History of Present Illness Pt is a 38 y.o. 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 S/p L frontoparietal craniectomy for evacuation of L basal ganglia hematoma 9/12. ETT 9/12-9/18, 9/18-9/23. PEG tube placed 9/28 secondary to dysphagia. PMH includes alcohol abuse, seizures, HTN.    PT Comments    Pt performed session this pm with focus on moving to edge of bed and sitting balance.  He appears to be fearful when transitioning to sitting.  Once in a seated position he continues to require min to moderate assistance and heavy reliance of LUE to maintain a seated position. Pt in sitting began to initiate movement towards recliner chair.  PTA elevated bed height and assisted patient from sitting edge of bed to sitting in WC.  He tolerated session well.  Pt seated in WC at RN station to observe the setting outside his room.     Follow Up Recommendations  SNF     Equipment Recommendations  Wheelchair (measurements PT);Wheelchair cushion (measurements PT);Hospital bed;3in1 (PT)    Recommendations for Other Services Rehab consult     Precautions / Restrictions Precautions Precautions: Fall Precaution Comments: L crani, PEG, L hand soft mitt, helmet Required Braces or Orthoses: Splint/Cast Restrictions Weight Bearing Restrictions: No    Mobility  Bed Mobility Overal bed mobility: Needs Assistance Bed Mobility: Rolling Rolling: Total assist   Supine to sit: Total assist     General bed mobility comments: Pt resistant to movement initially.  LIkely due to fear of falling.  Pt required assistance to come to sitting including moving LEs to edge of bed and elevating trunk into and seated position.  ONce in sitting he was attempting back to bed and laid back down for a couple of minutes attempting to turn.  Assisted  patient back into sitting and he helf to foot board with LUE.  Pt able to performed sitting edge of bed with min to mod assistace and heavy support from LUE x 8 min.  Transfers Overall transfer level: Needs assistance Equipment used: None Transfers: Squat Pivot Transfers     Squat pivot transfers: Max assist     General transfer comment: Pt seated edge of bed and when WC placed closer to pt he began to initiate hand placement on arm rest.  He then reach for the arm rest further away and PTA assisted him to partial standing and perfomed squat pivot from elevated bed.  Ambulation/Gait                 Stairs             Wheelchair Mobility    Modified Rankin (Stroke Patients Only)       Balance Overall balance assessment: Needs assistance   Sitting balance-Leahy Scale: Poor Sitting balance - Comments: Posterior push. min to mod to maintain sitting.     Standing balance-Leahy Scale: Poor Standing balance comment: heavy external assistance to pivot.  He did not achieve full standing but some where between squat and stand pivot.                            Cognition Arousal/Alertness: Awake/alert Behavior During Therapy: Flat affect;Restless Overall Cognitive Status: Impaired/Different from baseline Area of Impairment: Attention;Following commands;Safety/judgement;Awareness;Problem solving  Current Attention Level: Sustained Memory: Decreased short-term memory Following Commands: Follows one step commands inconsistently Safety/Judgement: Decreased awareness of safety;Decreased awareness of deficits   Problem Solving: Slow processing;Difficulty sequencing;Requires verbal cues General Comments: Pt initially resistance but once in seated position with WC close he began to reach for arm rest to initiate pivot from bed to wc.      Exercises      General Comments        Pertinent Vitals/Pain Pain Assessment: Faces Faces  Pain Scale: Hurts even more Pain Location: R UE with elbow extension Pain Descriptors / Indicators: Guarding Pain Intervention(s): Monitored during session;Repositioned    Home Living                      Prior Function            PT Goals (current goals can now be found in the care plan section) Acute Rehab PT Goals Patient Stated Goal: none stated Potential to Achieve Goals: Fair Progress towards PT goals: Progressing toward goals    Frequency    Min 2X/week      PT Plan Current plan remains appropriate    Co-evaluation              AM-PAC PT "6 Clicks" Mobility   Outcome Measure  Help needed turning from your back to your side while in a flat bed without using bedrails?: Total Help needed moving from lying on your back to sitting on the side of a flat bed without using bedrails?: Total Help needed moving to and from a bed to a chair (including a wheelchair)?: Total Help needed standing up from a chair using your arms (e.g., wheelchair or bedside chair)?: Total Help needed to walk in hospital room?: Total Help needed climbing 3-5 steps with a railing? : Total 6 Click Score: 6    End of Session Equipment Utilized During Treatment: Gait belt Activity Tolerance: Patient tolerated treatment well Patient left: in bed;with call bell/phone within reach;with bed alarm set;with family/visitor present Nurse Communication: Mobility status PT Visit Diagnosis: Muscle weakness (generalized) (M62.81);Hemiplegia and hemiparesis;Other abnormalities of gait and mobility (R26.89) Hemiplegia - Right/Left: Right Hemiplegia - dominant/non-dominant: Dominant Hemiplegia - caused by: Nontraumatic intracerebral hemorrhage     Time: 1200-1230 PT Time Calculation (min) (ACUTE ONLY): 30 min  Charges:  $Therapeutic Activity: 8-22 mins $Neuromuscular Re-education: 8-22 mins                     [name] , PTA Acute Rehabilitation Services Pager [phone] Office  [phone]     Ramata Strothman [name] 12/08/2019, 12:48 PM

## 2019-12-08 NOTE — Progress Notes (Signed)
Hypoglycemic Event CBG < 70  CBG: 68  Treatment: Orange juice per tube  Symptoms: Asymptomatic  Follow-up CBG: Time:0550 CBG Result:89  Possible Reasons for Event: 2 units of insuline was administered @0221  for BG of 144 Comments/MD notified: APP B. Lennox Grumbles    Joshua [name]

## 2019-12-09 LAB — GLUCOSE, CAPILLARY
Glucose-Capillary: 104 mg/dL — ABNORMAL HIGH (ref 70–99)
Glucose-Capillary: 145 mg/dL — ABNORMAL HIGH (ref 70–99)
Glucose-Capillary: 91 mg/dL (ref 70–99)
Glucose-Capillary: 99 mg/dL (ref 70–99)

## 2019-12-09 NOTE — Progress Notes (Signed)
[facility]  PROGRESS NOTE  Joshua [name] [id number] DOB: 1981/11/03 DOA: 09/09/2019 PCP: Patient, [name] Admit date - 09/09/2019   Admitting Physician Erline Levine, MD  Outpatient Primary MD for the patient is Patient, [name]  LOS - 49 Brief Narrative   Joshua [name] is a 38 y.o. year old male with medical history significant for HTN, poorly controlled, alcoholism who presented on 09/09/2019.  Per chart review patient was at a smoking establishment and had witnessed "seizure like activity" for a few minutes followed by confusion afterwards.  On EMS arrival patient is found to have bowel/urine incontinence, no right upper or lower extremity movement, nonverbal, and not following commands with intermittent vomiting.  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    Hospital course complicated by rapidly declining neurologic exam (enlarged left pupil, right-sided hemiplegia, unconscious) patient underwent left craniotomy with decompressive craniectomy and evacuation of ICH by neurosurgery on 09/09/2019.  PEG tube placed on 9/28 secondary to dysphagia.  Subjective  Joshua [name] today has no acute events overnight.  Remains nonverbal  No family at bedside today  A & P   1. Left hemispheric ICH.  Status post decompressive craniectomy and evacuation .  Dr. Vertell Limber (12/4) does not recommend surgery for flap replacement given patient's current clinical state, unless patient becomes more mobile.  Remains nonverbal with dense right-sided hemiplegia, able to move left side.  -PT/OT recommends SNF  -Continue supportive care, Seroquel  2. Seizures, secondary to above, stable  -Continue Keppra 500 mg twice daily  3. Aspiration pneumonia.  Currently n.p.o. given dysphagia  -All nutrition via PEG tube  -Completed 7-day course of antibiotic (Zosyn,  then Unasyn).  -Speech therapy signed off due to patient continued to refuse services  4. CKD stage IV.  Last Cr 1.4, normal output  -Check BMP  5. HTN, currently at goal. -Continue P 10 mg, metoprolol 100 mg twice daily, hydralazine 10 mg 3 times daily.  6. Severe protein calorie malnutrition  -Continue tube feeds  7. Mild anemia of CKD, hemoglobin has remained stable prior  - repeat CBC,   8. History of alcoholism Completed clonidine taper for presumed alcohol withdrawal earlier in hospital stay  -Continue folate     Family Communication  :  Spoke with GF Shelly at bedside  Code Status : Full code  Disposition Plan  : Medically stable, still awaiting bed offers per case management for rehab stay at skilled nursing facility as patient is dependent on total care given dependency for all ADLs patient is now completely disabled, continue to require feeding via PEG tube, unable to make needs known given nonverbal.  Consults  : Neurology, PCCM, neurosurgery, surgery   Procedures  :    9/12 intubation  9/12 craniotomy  9/18 extubation  9/18 re-intubation  9/23 extubation  10/1 EGD PEG placement  DVT Prophylaxis  :  Lovenox   Lab Results  Component Value Date   PLT 289 11/29/2019    Diet :  Diet Order            Diet NPO time specified  Diet effective midnight               Inpatient Medications Scheduled Meds: . amLODipine  10 mg Per Tube Daily  . chlorhexidine  15 mL Mouth Rinse BID  . enoxaparin (LOVENOX) injection  40 mg  Subcutaneous Daily  . feeding supplement (OSMOLITE 1.2 CAL)  474 mL Per Tube QID  . feeding supplement (PRO-STAT SUGAR FREE 64)  30 mL Per Tube BID  . folic acid  1 mg Per Tube Daily  . free water  300 mL Per Tube Q4H  . hydrALAZINE  10 mg Per Tube Q8H  . insulin aspart  0-15 Units Subcutaneous Q4H  . levETIRAcetam  500 mg Per Tube BID  . mouth rinse  15 mL Mouth Rinse q12n4p  . metoprolol tartrate  100 mg Per Tube BID  .  multivitamin  15 mL Per Tube Daily  . pantoprazole sodium  40 mg Per Tube BID  . QUEtiapine  50 mg Per Tube QHS  . sodium chloride flush  10-40 mL Intracatheter Q12H  . thiamine  100 mg Per Tube Daily   Continuous Infusions: PRN Meds:.acetaminophen **OR** acetaminophen, bisacodyl, glycopyrrolate, lip balm, [DISCONTINUED] ondansetron **OR** ondansetron (ZOFRAN) IV, polyethylene glycol, promethazine, sodium chloride flush  Antibiotics  :   Anti-infectives (From admission, onward)   Start     Dose/Rate Route Frequency Ordered Stop   09/27/19 1130  cephALEXin (KEFLEX) 250 MG/5ML suspension 500 mg     500 mg Per Tube Every 12 hours 09/27/19 1052 09/29/19 2250   09/25/19 1445  ceFAZolin (ANCEF) IVPB 2g/100 mL premix  Status:  Discontinued     2 g 200 mL/hr over 30 Minutes Intravenous Every 12 hours 09/25/19 1417 09/27/19 1052   09/14/19 1000  Ampicillin-Sulbactam (UNASYN) 3 g in sodium chloride 0.9 % 100 mL IVPB     3 g 200 mL/hr over 30 Minutes Intravenous Every 8 hours 09/14/19 0922 09/17/19 1816   09/13/19 1100  vancomycin (VANCOCIN) IVPB 750 mg/150 ml premix  Status:  Discontinued     750 mg 150 mL/hr over 60 Minutes Intravenous Every 24 hours 09/13/19 0728 09/13/19 0838   09/12/19 1100  vancomycin (VANCOCIN) IVPB 1000 mg/200 mL premix  Status:  Discontinued     1,000 mg 200 mL/hr over 60 Minutes Intravenous Every 24 hours 09/11/19 1034 09/13/19 0728   09/11/19 1130  piperacillin-tazobactam (ZOSYN) IVPB 3.375 g  Status:  Discontinued     3.375 g 12.5 mL/hr over 240 Minutes Intravenous Every 8 hours 09/11/19 1054 09/14/19 0845   09/11/19 1100  vancomycin (VANCOCIN) 1,500 mg in sodium chloride 0.9 % 500 mL IVPB     1,500 mg 250 mL/hr over 120 Minutes Intravenous  Once 09/11/19 1034 09/11/19 1900   09/11/19 1015  piperacillin-tazobactam (ZOSYN) IVPB 4.5 g  Status:  Discontinued     4.5 g 200 mL/hr over 30 Minutes Intravenous Every 8 hours 09/11/19 1010 09/11/19 1052   09/09/19 0845   ceFAZolin (ANCEF) IVPB 2g/100 mL premix     2 g 200 mL/hr over 30 Minutes Intravenous Every 8 hours 09/09/19 0842 09/09/19 1658       Objective   Vitals:   12/09/19 0500 12/09/19 0750 12/09/19 1155 12/09/19 1516  BP:  116/83 113/78 116/81  Pulse:  90 89 96  Resp:  18 15 20   Temp:  99.2 F (37.3 C) 98.9 F (37.2 C) 98.9 F (37.2 C)  TempSrc:  Oral Oral Axillary  SpO2:  98% 100% 100%  Weight: 58.7 kg     Height:        SpO2: 100 % O2 Flow Rate (L/min): 0 L/min FiO2 (%): 40 %  Wt Readings from Last 3 Encounters:  12/09/19 58.7 kg     Intake/Output Summary (  Last 24 hours) at 12/09/2019 1609 Last data filed at 12/09/2019 1538 Gross per 24 hour  Intake --  Output 2000 ml  Net -2000 ml    Physical Exam:  Awake Thin frail male, resting comfortably Left sided skull defect Right-sided hemiplegia, moving left upper and lower extremities without deficits Opens eyes to voice, otherwise not following commands No new F.N deficits,  [place].AT, No JVD Symmetrical Chest wall movement, Good air movement bilaterally, CTAB on room air RRR,No Gallops,Rubs or new Murmurs,  Abdominal binder in place No Cyanosis, Clubbing or edema, No new Rash or bruise     I have personally reviewed the following:   Data Reviewed:  CBC No results for input(s): WBC, HGB, HCT, PLT, MCV, MCH, MCHC, RDW, LYMPHSABS, MONOABS, EOSABS, BASOSABS, BANDABS in the last 168 hours.  Invalid input(s): NEUTRABS, BANDSABD  Chemistries  No results for input(s): NA, K, CL, CO2, GLUCOSE, BUN, CREATININE, CALCIUM, MG, AST, ALT, ALKPHOS, BILITOT in the last 168 hours.  Invalid input(s): GFRCGP ------------------------------------------------------------------------------------------------------------------ No results for input(s): CHOL, HDL, LDLCALC, TRIG, CHOLHDL, LDLDIRECT in the last 72 hours.  Lab Results  Component Value Date   HGBA1C 5.6 09/10/2019    ------------------------------------------------------------------------------------------------------------------ No results for input(s): TSH, T4TOTAL, T3FREE, THYROIDAB in the last 72 hours.  Invalid input(s): FREET3 ------------------------------------------------------------------------------------------------------------------ No results for input(s): VITAMINB12, FOLATE, FERRITIN, TIBC, IRON, RETICCTPCT in the last 72 hours.  Coagulation profile No results for input(s): INR, PROTIME in the last 168 hours.  No results for input(s): DDIMER in the last 72 hours.  Cardiac Enzymes No results for input(s): CKMB, TROPONINI, MYOGLOBIN in the last 168 hours.  Invalid input(s): CK ------------------------------------------------------------------------------------------------------------------ No results found for: BNP  Micro Results No results found for this or any previous visit (from the past 240 hour(s)).  Radiology Reports No results found.   Time Spent in minutes  30     Desiree Hane M.D on 12/09/2019 at 4:09 PM  To page go to [email or web address] - password [email or web address]

## 2019-12-09 NOTE — Plan of Care (Signed)
Patient received meal supplement as order this morning without any complications. Will continue to monitor.

## 2019-12-09 NOTE — Progress Notes (Signed)
[facility]  PROGRESS NOTE  Avalon Zubia [id number] DOB: 20-Oct-1981 DOA: 09/09/2019 PCP: Patient, [name] Admit date - 09/09/2019   Admitting Physician Erline Levine, MD  Outpatient Primary MD for the patient is Patient, [name]  LOS - 34 Brief Narrative   Raynier Musolino is a 38 y.o. year old male with medical history significant for HTN, poorly controlled, alcoholism who presented on 09/09/2019.  Per chart review patient was at a smoking establishment and had witnessed "seizure like activity" for a few minutes followed by confusion afterwards.  On EMS arrival patient is found to have bowel/urine incontinence, no right upper or lower extremity movement, nonverbal, and not following commands with intermittent vomiting.  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    Hospital course complicated by rapidly declining neurologic exam (enlarged left pupil, right-sided hemiplegia, unconscious) patient underwent left craniotomy with decompressive craniectomy and evacuation of ICH by neurosurgery on 09/09/2019.  PEG tube placed on 9/28 secondary to dysphagia.  Subjective  Mr.  Arndt today has no acute events overnight.  Remains nonverbal  Girlfriend Shelly at bedside. States he has been ok this morning  A & P   1. Left hemispheric ICH.  Status post decompressive craniectomy and evacuation .  Dr. Vertell Limber (12/4) does not recommend surgery for flap replacement given patient's current clinical state, unless patient becomes more mobile.  Remains nonverbal with dense right-sided hemiplegia, able to move left side.  -PT/OT recommends SNF  -Continue supportive care, Seroquel  2. Seizures, secondary to above, stable  -Continue Keppra 500 mg twice daily  3. Aspiration pneumonia.  Currently n.p.o. given dysphagia  -All nutrition via PEG tube  -Completed  7-day course of antibiotic (Zosyn, then Unasyn).  -Speech therapy signed off due to patient continued to refuse services  4. CKD stage IV.  Last Cr 1.4, normal output  -Check BMP  5. HTN, currently at goal. -Continue P 10 mg, metoprolol 100 mg twice daily, hydralazine 10 mg 3 times daily.  6. Severe protein calorie malnutrition  -Continue tube feeds  7. Mild anemia of CKD, hemoglobin has remained stable prior  - repeat CBC,   8. History of alcoholism Completed clonidine taper for presumed alcohol withdrawal earlier in hospital stay  -Continue folate     Family Communication  :  Spoke with GF Shelly at bedside  Code Status : Full code  Disposition Plan  : Medically stable, still awaiting bed offers per case management for rehab stay at skilled nursing facility as patient is dependent on total care given dependency for all ADLs patient is now completely disabled, continue to require feeding via PEG tube, unable to make needs known given nonverbal.  Consults  : Neurology, PCCM, neurosurgery, surgery   Procedures  :    9/12 intubation  9/12 craniotomy  9/18 extubation  9/18 re-intubation  9/23 extubation  10/1 EGD PEG placement  DVT Prophylaxis  :  Lovenox   Lab Results  Component Value Date   PLT 289 11/29/2019    Diet :  Diet Order            Diet NPO time specified  Diet effective midnight               Inpatient Medications Scheduled Meds: . amLODipine  10 mg Per Tube Daily  . chlorhexidine  15 mL Mouth Rinse BID  .  enoxaparin (LOVENOX) injection  40 mg Subcutaneous Daily  . feeding supplement (OSMOLITE 1.2 CAL)  474 mL Per Tube QID  . feeding supplement (PRO-STAT SUGAR FREE 64)  30 mL Per Tube BID  . folic acid  1 mg Per Tube Daily  . free water  300 mL Per Tube Q4H  . hydrALAZINE  10 mg Per Tube Q8H  . insulin aspart  0-15 Units Subcutaneous Q4H  . levETIRAcetam  500 mg Per Tube BID  . mouth rinse  15 mL Mouth Rinse q12n4p  . metoprolol  tartrate  100 mg Per Tube BID  . multivitamin  15 mL Per Tube Daily  . pantoprazole sodium  40 mg Per Tube BID  . QUEtiapine  50 mg Per Tube QHS  . sodium chloride flush  10-40 mL Intracatheter Q12H  . thiamine  100 mg Per Tube Daily   Continuous Infusions: PRN Meds:.acetaminophen **OR** acetaminophen, bisacodyl, glycopyrrolate, lip balm, [DISCONTINUED] ondansetron **OR** ondansetron (ZOFRAN) IV, polyethylene glycol, promethazine, sodium chloride flush  Antibiotics  :   Anti-infectives (From admission, onward)   Start     Dose/Rate Route Frequency Ordered Stop   09/27/19 1130  cephALEXin (KEFLEX) 250 MG/5ML suspension 500 mg     500 mg Per Tube Every 12 hours 09/27/19 1052 09/29/19 2250   09/25/19 1445  ceFAZolin (ANCEF) IVPB 2g/100 mL premix  Status:  Discontinued     2 g 200 mL/hr over 30 Minutes Intravenous Every 12 hours 09/25/19 1417 09/27/19 1052   09/14/19 1000  Ampicillin-Sulbactam (UNASYN) 3 g in sodium chloride 0.9 % 100 mL IVPB     3 g 200 mL/hr over 30 Minutes Intravenous Every 8 hours 09/14/19 0922 09/17/19 1816   09/13/19 1100  vancomycin (VANCOCIN) IVPB 750 mg/150 ml premix  Status:  Discontinued     750 mg 150 mL/hr over 60 Minutes Intravenous Every 24 hours 09/13/19 0728 09/13/19 0838   09/12/19 1100  vancomycin (VANCOCIN) IVPB 1000 mg/200 mL premix  Status:  Discontinued     1,000 mg 200 mL/hr over 60 Minutes Intravenous Every 24 hours 09/11/19 1034 09/13/19 0728   09/11/19 1130  piperacillin-tazobactam (ZOSYN) IVPB 3.375 g  Status:  Discontinued     3.375 g 12.5 mL/hr over 240 Minutes Intravenous Every 8 hours 09/11/19 1054 09/14/19 0845   09/11/19 1100  vancomycin (VANCOCIN) 1,500 mg in sodium chloride 0.9 % 500 mL IVPB     1,500 mg 250 mL/hr over 120 Minutes Intravenous  Once 09/11/19 1034 09/11/19 1900   09/11/19 1015  piperacillin-tazobactam (ZOSYN) IVPB 4.5 g  Status:  Discontinued     4.5 g 200 mL/hr over 30 Minutes Intravenous Every 8 hours 09/11/19 1010  09/11/19 1052   09/09/19 0845  ceFAZolin (ANCEF) IVPB 2g/100 mL premix     2 g 200 mL/hr over 30 Minutes Intravenous Every 8 hours 09/09/19 0842 09/09/19 1658       Objective   Vitals:   12/09/19 0500 12/09/19 0750 12/09/19 1155 12/09/19 1516  BP:  116/83 113/78 116/81  Pulse:  90 89 96  Resp:  18 15 20   Temp:  99.2 F (37.3 C) 98.9 F (37.2 C) 98.9 F (37.2 C)  TempSrc:  Oral Oral Axillary  SpO2:  98% 100% 100%  Weight: 58.7 kg     Height:        SpO2: 100 % O2 Flow Rate (L/min): 0 L/min FiO2 (%): 40 %  Wt Readings from Last 3 Encounters:  12/09/19 58.7 kg  Intake/Output Summary (Last 24 hours) at 12/09/2019 1605 Last data filed at 12/09/2019 1538 Gross per 24 hour  Intake --  Output 2000 ml  Net -2000 ml    Physical Exam:  Awake Thin frail male Left sided skull defect Right-sided hemiplegia, moving left upper and lower extremities without deficits Opens eyes to voice, otherwise not following commands No new F.N deficits,  [place].AT, No JVD Symmetrical Chest wall movement, Good air movement bilaterally, CTAB on room air RRR,No Gallops,Rubs or new Murmurs,  Abdominal binder in place No Cyanosis, Clubbing or edema, No new Rash or bruise     I have personally reviewed the following:   Data Reviewed:  CBC No results for input(s): WBC, HGB, HCT, PLT, MCV, MCH, MCHC, RDW, LYMPHSABS, MONOABS, EOSABS, BASOSABS, BANDABS in the last 168 hours.  Invalid input(s): NEUTRABS, BANDSABD  Chemistries  No results for input(s): NA, K, CL, CO2, GLUCOSE, BUN, CREATININE, CALCIUM, MG, AST, ALT, ALKPHOS, BILITOT in the last 168 hours.  Invalid input(s): GFRCGP ------------------------------------------------------------------------------------------------------------------ No results for input(s): CHOL, HDL, LDLCALC, TRIG, CHOLHDL, LDLDIRECT in the last 72 hours.  Lab Results  Component Value Date   HGBA1C 5.6 09/10/2019    ------------------------------------------------------------------------------------------------------------------ No results for input(s): TSH, T4TOTAL, T3FREE, THYROIDAB in the last 72 hours.  Invalid input(s): FREET3 ------------------------------------------------------------------------------------------------------------------ No results for input(s): VITAMINB12, FOLATE, FERRITIN, TIBC, IRON, RETICCTPCT in the last 72 hours.  Coagulation profile No results for input(s): INR, PROTIME in the last 168 hours.  No results for input(s): DDIMER in the last 72 hours.  Cardiac Enzymes No results for input(s): CKMB, TROPONINI, MYOGLOBIN in the last 168 hours.  Invalid input(s): CK ------------------------------------------------------------------------------------------------------------------ No results found for: BNP  Micro Results No results found for this or any previous visit (from the past 240 hour(s)).  Radiology Reports No results found.   Time Spent in minutes  30     Desiree Hane M.D on 12/09/2019 at 4:05 PM  To page go to [email or web address] - password [facility]

## 2019-12-10 LAB — GLUCOSE, CAPILLARY
Glucose-Capillary: 85 mg/dL (ref 70–99)
Glucose-Capillary: 93 mg/dL (ref 70–99)
Glucose-Capillary: 136 mg/dL — ABNORMAL HIGH (ref 70–99)
Glucose-Capillary: 63 mg/dL — ABNORMAL LOW (ref 70–99)
Glucose-Capillary: 135 mg/dL — ABNORMAL HIGH (ref 70–99)
Glucose-Capillary: 120 mg/dL — ABNORMAL HIGH (ref 70–99)

## 2019-12-10 NOTE — Progress Notes (Signed)
[facility]  PROGRESS NOTE  Joshua [name] [id number] DOB: 1981-10-31 DOA: 09/09/2019 PCP: Patient, [name] Admit date - 09/09/2019   Admitting Physician Erline Levine, MD  Outpatient Primary MD for the patient is Patient, [name]  LOS - 37 Brief Narrative   Joshua [name] is a 38 y.o. year old male with medical history significant for HTN, poorly controlled, alcoholism who presented on 09/09/2019.  Per chart review patient was at a smoking establishment and had witnessed "seizure like activity" for a few minutes followed by confusion afterwards.  On EMS arrival patient is found to have bowel/urine incontinence, no right upper or lower extremity movement, nonverbal, and not following commands with intermittent vomiting.  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    Hospital course complicated by rapidly declining neurologic exam (enlarged left pupil, right-sided hemiplegia, unconscious) patient underwent left craniotomy with decompressive craniectomy and evacuation of ICH by neurosurgery on 09/09/2019.  PEG tube placed on 9/28 secondary to dysphagia.  Subjective  [name] today has no acute events overnight.  Remains nonverbal  No family at bedside today  A & P   1. Left hemispheric ICH.  Status post decompressive craniectomy and evacuation .  Dr. Vertell Limber (12/4) does not recommend surgery for flap replacement given patient's current clinical state, unless patient becomes more mobile.  Remains nonverbal with dense right-sided hemiplegia, able to move left side.  -PT/OT recommends SNF  -Continue supportive care, Seroquel  2. Seizures, secondary to above, stable  -Continue Keppra 500 mg twice daily  3. Aspiration pneumonia.  Currently n.p.o. given dysphagia  -All nutrition via PEG tube  -Completed 7-day course of antibiotic (Zosyn,  then Unasyn).  -Speech therapy signed off due to patient continued to refuse services  4. CKD stage IV.  Last Cr 1.4, normal output  -Check BMP tomorrow am  5. HTN, currently at goal. -Continue P 10 mg, metoprolol 100 mg twice daily, hydralazine 10 mg 3 times daily.  6. Severe protein calorie malnutrition  -Continue tube feeds  7. Mild anemia of CKD, hemoglobin has remained stable prior  - repeat CBC in am  8. History of alcoholism Completed clonidine taper for presumed alcohol withdrawal earlier in hospital stay  -Continue folate     Family Communication  :  Spoke with GF Shelly at bedside jon 12/12  Code Status : Full code  Disposition Plan  : Medically stable, still awaiting bed offers per case management for rehab stay at skilled nursing facility as patient is dependent on total care given dependency for all ADLs patient is now completely disabled, continue to require feeding via PEG tube, unable to make needs known given nonverbal.  Consults  : Neurology, PCCM, neurosurgery, surgery   Procedures  :    9/12 intubation  9/12 craniotomy  9/18 extubation  9/18 re-intubation  9/23 extubation  10/1 EGD PEG placement  DVT Prophylaxis  :  Lovenox   Lab Results  Component Value Date   PLT 289 11/29/2019    Diet :  Diet Order            Diet NPO time specified  Diet effective midnight               Inpatient Medications Scheduled Meds: . amLODipine  10 mg Per Tube Daily  . chlorhexidine  15 mL Mouth Rinse BID  . enoxaparin (  LOVENOX) injection  40 mg Subcutaneous Daily  . feeding supplement (OSMOLITE 1.2 CAL)  474 mL Per Tube QID  . feeding supplement (PRO-STAT SUGAR FREE 64)  30 mL Per Tube BID  . folic acid  1 mg Per Tube Daily  . free water  300 mL Per Tube Q4H  . hydrALAZINE  10 mg Per Tube Q8H  . insulin aspart  0-15 Units Subcutaneous Q4H  . levETIRAcetam  500 mg Per Tube BID  . mouth rinse  15 mL Mouth Rinse q12n4p  . metoprolol tartrate  100  mg Per Tube BID  . multivitamin  15 mL Per Tube Daily  . pantoprazole sodium  40 mg Per Tube BID  . QUEtiapine  50 mg Per Tube QHS  . sodium chloride flush  10-40 mL Intracatheter Q12H  . thiamine  100 mg Per Tube Daily   Continuous Infusions: PRN Meds:.acetaminophen **OR** acetaminophen, bisacodyl, glycopyrrolate, lip balm, [DISCONTINUED] ondansetron **OR** ondansetron (ZOFRAN) IV, polyethylene glycol, promethazine, sodium chloride flush  Antibiotics  :   Anti-infectives (From admission, onward)   Start     Dose/Rate Route Frequency Ordered Stop   09/27/19 1130  cephALEXin (KEFLEX) 250 MG/5ML suspension 500 mg     500 mg Per Tube Every 12 hours 09/27/19 1052 09/29/19 2250   09/25/19 1445  ceFAZolin (ANCEF) IVPB 2g/100 mL premix  Status:  Discontinued     2 g 200 mL/hr over 30 Minutes Intravenous Every 12 hours 09/25/19 1417 09/27/19 1052   09/14/19 1000  Ampicillin-Sulbactam (UNASYN) 3 g in sodium chloride 0.9 % 100 mL IVPB     3 g 200 mL/hr over 30 Minutes Intravenous Every 8 hours 09/14/19 0922 09/17/19 1816   09/13/19 1100  vancomycin (VANCOCIN) IVPB 750 mg/150 ml premix  Status:  Discontinued     750 mg 150 mL/hr over 60 Minutes Intravenous Every 24 hours 09/13/19 0728 09/13/19 0838   09/12/19 1100  vancomycin (VANCOCIN) IVPB 1000 mg/200 mL premix  Status:  Discontinued     1,000 mg 200 mL/hr over 60 Minutes Intravenous Every 24 hours 09/11/19 1034 09/13/19 0728   09/11/19 1130  piperacillin-tazobactam (ZOSYN) IVPB 3.375 g  Status:  Discontinued     3.375 g 12.5 mL/hr over 240 Minutes Intravenous Every 8 hours 09/11/19 1054 09/14/19 0845   09/11/19 1100  vancomycin (VANCOCIN) 1,500 mg in sodium chloride 0.9 % 500 mL IVPB     1,500 mg 250 mL/hr over 120 Minutes Intravenous  Once 09/11/19 1034 09/11/19 1900   09/11/19 1015  piperacillin-tazobactam (ZOSYN) IVPB 4.5 g  Status:  Discontinued     4.5 g 200 mL/hr over 30 Minutes Intravenous Every 8 hours 09/11/19 1010 09/11/19 1052     09/09/19 0845  ceFAZolin (ANCEF) IVPB 2g/100 mL premix     2 g 200 mL/hr over 30 Minutes Intravenous Every 8 hours 09/09/19 0842 09/09/19 1658       Objective   Vitals:   12/10/19 0500 12/10/19 0758 12/10/19 1128 12/10/19 1550  BP:  120/83 119/89 131/84  Pulse:  76 83 87  Resp:  19 17 18   Temp:  98.3 F (36.8 C) 98.7 F (37.1 C) 98.6 F (37 C)  TempSrc:  Oral Oral Oral  SpO2:  100% 100% 100%  Weight: 60.5 kg     Height:        SpO2: 100 % O2 Flow Rate (L/min): 0 L/min FiO2 (%): 40 %  Wt Readings from Last 3 Encounters:  12/10/19 60.5 kg  Intake/Output Summary (Last 24 hours) at 12/10/2019 1611 Last data filed at 12/10/2019 0543 Gross per 24 hour  Intake --  Output 950 ml  Net -950 ml    Physical Exam:  Awake Thin frail male, resting comfortably Left sided skull defect Right-sided hemiplegia, moving left upper and lower extremities without deficits Opens eyes to voice and gives eye contact, otherwise not following commands No appreciable new F.N deficits,  Symmetrical Chest wall movement, Good air movement bilaterally, CTAB on room air RRR,No Gallops,Rubs or new Murmurs,  Abdominal binder in place    I have personally reviewed the following:   Data Reviewed:  CBC No results for input(s): WBC, HGB, HCT, PLT, MCV, MCH, MCHC, RDW, LYMPHSABS, MONOABS, EOSABS, BASOSABS, BANDABS in the last 168 hours.  Invalid input(s): NEUTRABS, BANDSABD  Chemistries  No results for input(s): NA, K, CL, CO2, GLUCOSE, BUN, CREATININE, CALCIUM, MG, AST, ALT, ALKPHOS, BILITOT in the last 168 hours.  Invalid input(s): GFRCGP ------------------------------------------------------------------------------------------------------------------ No results for input(s): CHOL, HDL, LDLCALC, TRIG, CHOLHDL, LDLDIRECT in the last 72 hours.  Lab Results  Component Value Date   HGBA1C 5.6 09/10/2019    ------------------------------------------------------------------------------------------------------------------ No results for input(s): TSH, T4TOTAL, T3FREE, THYROIDAB in the last 72 hours.  Invalid input(s): FREET3 ------------------------------------------------------------------------------------------------------------------ No results for input(s): VITAMINB12, FOLATE, FERRITIN, TIBC, IRON, RETICCTPCT in the last 72 hours.  Coagulation profile No results for input(s): INR, PROTIME in the last 168 hours.  No results for input(s): DDIMER in the last 72 hours.  Cardiac Enzymes No results for input(s): CKMB, TROPONINI, MYOGLOBIN in the last 168 hours.  Invalid input(s): CK ------------------------------------------------------------------------------------------------------------------ No results found for: BNP  Micro Results No results found for this or any previous visit (from the past 240 hour(s)).  Radiology Reports No results found.   Time Spent in minutes  30     Desiree Hane M.D on 12/10/2019 at 4:11 PM  To page go to [email or web address] - password [facility]

## 2019-12-10 NOTE — Plan of Care (Signed)
  Problem: Nutrition: Goal: Adequate nutrition will be maintained Outcome: Progressing   Problem: Elimination: Goal: Will not experience complications related to bowel motility Outcome: Progressing   Problem: Safety: Goal: Ability to remain free from injury will improve Outcome: Progressing   

## 2019-12-11 LAB — GLUCOSE, CAPILLARY
Glucose-Capillary: 103 mg/dL — ABNORMAL HIGH (ref 70–99)
Glucose-Capillary: 106 mg/dL — ABNORMAL HIGH (ref 70–99)
Glucose-Capillary: 132 mg/dL — ABNORMAL HIGH (ref 70–99)
Glucose-Capillary: 140 mg/dL — ABNORMAL HIGH (ref 70–99)
Glucose-Capillary: 86 mg/dL (ref 70–99)
Glucose-Capillary: 94 mg/dL (ref 70–99)

## 2019-12-11 LAB — CBC
HCT: 35.2 % — ABNORMAL LOW (ref 39.0–52.0)
Hemoglobin: 11.1 g/dL — ABNORMAL LOW (ref 13.0–17.0)
MCH: 26.6 pg (ref 26.0–34.0)
MCHC: 31.5 g/dL (ref 30.0–36.0)
MCV: 84.4 fL (ref 80.0–100.0)
Platelets: 286 10*3/uL (ref 150–400)
RBC: 4.17 MIL/uL — ABNORMAL LOW (ref 4.22–5.81)
RDW: 14 % (ref 11.5–15.5)
WBC: 6.5 10*3/uL (ref 4.0–10.5)
nRBC: 0 % (ref 0.0–0.2)

## 2019-12-11 LAB — COMPREHENSIVE METABOLIC PANEL
ALT: 37 U/L (ref 0–44)
AST: 17 U/L (ref 15–41)
Albumin: 3 g/dL — ABNORMAL LOW (ref 3.5–5.0)
Alkaline Phosphatase: 65 U/L (ref 38–126)
Anion gap: 11 (ref 5–15)
BUN: 37 mg/dL — ABNORMAL HIGH (ref 6–20)
CO2: 28 mmol/L (ref 22–32)
Calcium: 9.9 mg/dL (ref 8.9–10.3)
Chloride: 102 mmol/L (ref 98–111)
Creatinine, Ser: 1.28 mg/dL — ABNORMAL HIGH (ref 0.61–1.24)
GFR calc Af Amer: >60 mL/min (ref >60)
GFR calc non Af Amer: >60 mL/min (ref >60)
Glucose, Bld: 81 mg/dL (ref 70–99)
Potassium: 4.3 mmol/L (ref 3.5–5.1)
Sodium: 141 mmol/L (ref 135–145)
Total Bilirubin: 0.4 mg/dL (ref 0.3–1.2)
Total Protein: 6.6 g/dL (ref 6.5–8.1)

## 2019-12-11 NOTE — Progress Notes (Signed)
[facility]  PROGRESS NOTE  [name] [id number] DOB: [birth year] DOA: 09/09/2019 PCP: Patient, [name] Admit date - 09/09/2019   Admitting Physician Erline Levine, MD  Outpatient Primary MD for the patient is Patient, [name]  LOS - 64 Brief Narrative   Joshua [name] is a 38 y.o. year old male with medical history significant for HTN, poorly controlled, alcoholism who presented on 09/09/2019.  Per chart review patient was at a smoking establishment and had witnessed "seizure like activity" for a few minutes followed by confusion afterwards.  On EMS arrival patient is found to have bowel/urine incontinence, no right upper or lower extremity movement, nonverbal, and not following commands with intermittent vomiting.  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    Hospital course complicated by rapidly declining neurologic exam (enlarged left pupil, right-sided hemiplegia, unconscious) patient underwent left craniotomy with decompressive craniectomy and evacuation of ICH by neurosurgery on 09/09/2019.  PEG tube placed on 9/28 secondary to dysphagia.  Subjective  Mr.  Joshua [name] today has no acute events overnight. Nurse reports mild cough.  Remains nonverbal  No family at bedside today  A & P   1. Left hemispheric ICH.  Status post decompressive craniectomy and evacuation .  Dr. Vertell Limber (12/4) does not recommend surgery for flap replacement given patient's current clinical state, unless patient becomes more mobile.  Remains nonverbal with dense right-sided hemiplegia, able to move left side.  -PT/OT recommends SNF  -Continue supportive care, Seroquel  2. Seizures, secondary to above, stable  -Continue Keppra 500 mg twice daily  3. Aspiration pneumonia.  Currently n.p.o. given dysphagia  -All nutrition via PEG tube  -Completed 7-day course  of antibiotic (Zosyn, then Unasyn).  -Speech therapy signed off due to patient continued to refuse services  4. CKD stage IV.  Last Cr 1.4, normal output  -Check BMP tomorrow am  5. HTN, currently at goal. -Continue P 10 mg, metoprolol 100 mg twice daily, hydralazine 10 mg 3 times daily.  6. Severe protein calorie malnutrition  -Continue tube feeds  7. Mild anemia of CKD, hemoglobin has remained stable prior  - repeat CBC in am  8. History of alcoholism Completed clonidine taper for presumed alcohol withdrawal earlier in hospital stay  -Continue folate     Family Communication  :  Spoke with GF Joshua [name] at bedside on 12/12  Code Status : Full code  Disposition Plan  : Medically stable, still awaiting bed offers per case management for rehab stay at skilled nursing facility as patient is dependent on total care given dependency for all ADLs patient is now completely disabled, continue to require feeding via PEG tube, unable to make needs known given nonverbal.  Consults  : Neurology, PCCM, neurosurgery, surgery   Procedures  :    9/12 intubation  9/12 craniotomy  9/18 extubation  9/18 re-intubation  9/23 extubation  10/1 EGD PEG placement  DVT Prophylaxis  :  Lovenox   Lab Results  Component Value Date   PLT 286 12/11/2019    Diet :  Diet Order            Diet NPO time specified  Diet effective midnight               Inpatient Medications Scheduled Meds: . amLODipine  10 mg Per Tube Daily  . chlorhexidine  15 mL Mouth Rinse  BID  . enoxaparin (LOVENOX) injection  40 mg Subcutaneous Daily  . feeding supplement (OSMOLITE 1.2 CAL)  474 mL Per Tube QID  . feeding supplement (PRO-STAT SUGAR FREE 64)  30 mL Per Tube BID  . folic acid  1 mg Per Tube Daily  . free water  300 mL Per Tube Q4H  . hydrALAZINE  10 mg Per Tube Q8H  . insulin aspart  0-15 Units Subcutaneous Q4H  . levETIRAcetam  500 mg Per Tube BID  . mouth rinse  15 mL Mouth Rinse q12n4p  .  metoprolol tartrate  100 mg Per Tube BID  . multivitamin  15 mL Per Tube Daily  . pantoprazole sodium  40 mg Per Tube BID  . QUEtiapine  50 mg Per Tube QHS  . sodium chloride flush  10-40 mL Intracatheter Q12H  . thiamine  100 mg Per Tube Daily   Continuous Infusions: PRN Meds:.acetaminophen **OR** acetaminophen, bisacodyl, glycopyrrolate, lip balm, [DISCONTINUED] ondansetron **OR** ondansetron (ZOFRAN) IV, polyethylene glycol, promethazine, sodium chloride flush  Antibiotics  :   Anti-infectives (From admission, onward)   Start     Dose/Rate Route Frequency Ordered Stop   09/27/19 1130  cephALEXin (KEFLEX) 250 MG/5ML suspension 500 mg     500 mg Per Tube Every 12 hours 09/27/19 1052 09/29/19 2250   09/25/19 1445  ceFAZolin (ANCEF) IVPB 2g/100 mL premix  Status:  Discontinued     2 g 200 mL/hr over 30 Minutes Intravenous Every 12 hours 09/25/19 1417 09/27/19 1052   09/14/19 1000  Ampicillin-Sulbactam (UNASYN) 3 g in sodium chloride 0.9 % 100 mL IVPB     3 g 200 mL/hr over 30 Minutes Intravenous Every 8 hours 09/14/19 0922 09/17/19 1816   09/13/19 1100  vancomycin (VANCOCIN) IVPB 750 mg/150 ml premix  Status:  Discontinued     750 mg 150 mL/hr over 60 Minutes Intravenous Every 24 hours 09/13/19 0728 09/13/19 0838   09/12/19 1100  vancomycin (VANCOCIN) IVPB 1000 mg/200 mL premix  Status:  Discontinued     1,000 mg 200 mL/hr over 60 Minutes Intravenous Every 24 hours 09/11/19 1034 09/13/19 0728   09/11/19 1130  piperacillin-tazobactam (ZOSYN) IVPB 3.375 g  Status:  Discontinued     3.375 g 12.5 mL/hr over 240 Minutes Intravenous Every 8 hours 09/11/19 1054 09/14/19 0845   09/11/19 1100  vancomycin (VANCOCIN) 1,500 mg in sodium chloride 0.9 % 500 mL IVPB     1,500 mg 250 mL/hr over 120 Minutes Intravenous  Once 09/11/19 1034 09/11/19 1900   09/11/19 1015  piperacillin-tazobactam (ZOSYN) IVPB 4.5 g  Status:  Discontinued     4.5 g 200 mL/hr over 30 Minutes Intravenous Every 8 hours  09/11/19 1010 09/11/19 1052   09/09/19 0845  ceFAZolin (ANCEF) IVPB 2g/100 mL premix     2 g 200 mL/hr over 30 Minutes Intravenous Every 8 hours 09/09/19 0842 09/09/19 1658       Objective   Vitals:   12/11/19 0410 12/11/19 0500 12/11/19 0700 12/11/19 1153  BP: 119/83  (!) 134/99 (!) 121/92  Pulse: 79  83 81  Resp: 17  15 15   Temp: 97.8 F (36.6 C)  98.1 F (36.7 C) 98.3 F (36.8 C)  TempSrc: Oral  Axillary Oral  SpO2: 100%  100% 98%  Weight:  59.8 kg    Height:        SpO2: 98 % O2 Flow Rate (L/min): 0 L/min FiO2 (%): 40 %  Wt Readings from Last 3  Encounters:  12/11/19 59.8 kg    No intake or output data in the 24 hours ending 12/11/19 1543  Physical Exam:  Awake Thin frail male, resting comfortably Left sided skull defect Right-sided hemiplegia, moving left upper and lower extremities without deficits Opens eyes to voice and gives eye contact, otherwise not following commands No appreciable new F.N deficits,  Symmetrical Chest wall movement, Good air movement bilaterally, CTAB on room air RRR,No Gallops,Rubs or new Murmurs,  Abdominal binder in place    I have personally reviewed the following:   Data Reviewed:  CBC Recent Labs  Lab 12/11/19 0242  WBC 6.5  HGB 11.1*  HCT 35.2*  PLT 286  MCV 84.4  MCH 26.6  MCHC 31.5  RDW 14.0    Chemistries  Recent Labs  Lab 12/11/19 0242  NA 141  K 4.3  CL 102  CO2 28  GLUCOSE 81  BUN 37*  CREATININE 1.28*  CALCIUM 9.9  AST 17  ALT 37  ALKPHOS 65  BILITOT 0.4   ------------------------------------------------------------------------------------------------------------------ No results for input(s): CHOL, HDL, LDLCALC, TRIG, CHOLHDL, LDLDIRECT in the last 72 hours.  Lab Results  Component Value Date   HGBA1C 5.6 09/10/2019   ------------------------------------------------------------------------------------------------------------------ No results for input(s): TSH, T4TOTAL, T3FREE,  THYROIDAB in the last 72 hours.  Invalid input(s): FREET3 ------------------------------------------------------------------------------------------------------------------ No results for input(s): VITAMINB12, FOLATE, FERRITIN, TIBC, IRON, RETICCTPCT in the last 72 hours.  Coagulation profile No results for input(s): INR, PROTIME in the last 168 hours.  No results for input(s): DDIMER in the last 72 hours.  Cardiac Enzymes No results for input(s): CKMB, TROPONINI, MYOGLOBIN in the last 168 hours.  Invalid input(s): CK ------------------------------------------------------------------------------------------------------------------ No results found for: BNP  Micro Results No results found for this or any previous visit (from the past 240 hour(s)).  Radiology Reports No results found.   Time Spent in minutes  30     Desiree Hane M.D on 12/11/2019 at 3:43 PM  To page go to [email or web address] - password [facility]

## 2019-12-12 LAB — GLUCOSE, CAPILLARY
Glucose-Capillary: 111 mg/dL — ABNORMAL HIGH (ref 70–99)
Glucose-Capillary: 123 mg/dL — ABNORMAL HIGH (ref 70–99)
Glucose-Capillary: 128 mg/dL — ABNORMAL HIGH (ref 70–99)
Glucose-Capillary: 80 mg/dL (ref 70–99)
Glucose-Capillary: 85 mg/dL (ref 70–99)
Glucose-Capillary: 97 mg/dL (ref 70–99)

## 2019-12-12 MED ORDER — HYDRALAZINE HCL 25 MG PO TABS
25.0000 mg | ORAL_TABLET | Freq: Three times a day (TID) | ORAL | Status: DC
  Administered 2019-12-12 – 2020-01-07 (×73): 25 mg
  Filled 2019-12-12 (×74): qty 1

## 2019-12-12 NOTE — TOC Progression Note (Signed)
Transition of Care [facility]) - Progression Note    Patient Details  Name: Meco Schlafer MRN: [id number] Date of Birth: 11-14-1981  Transition [facility]) CM/SW [place], [place] Phone Number: 12/12/2019, 11:27 AM  Clinical Narrative:    Still no bed offers and on LOG list. Mother states the plan is for patient to d/c home after a rehab stay. TOC following.   Expected Discharge Plan: Skilled Nursing Facility Barriers to Discharge: SNF Pending payor source - LOG, SNF Pending bed offer, Inadequate or no insurance  Expected Discharge Plan and Services Expected Discharge Plan: [place] In-house Referral: Clinical Social Work Discharge Planning Services: NA Post Acute Care Choice: [place] Living arrangements for the past 2 months: Apartment            DME Arranged: N/A DME Agency: NA HH Arranged: NA [place] Agency: NA  Readmission Risk Interventions No flowsheet data found.

## 2019-12-12 NOTE — Progress Notes (Signed)
Physical Therapy Treatment Patient Details Name: Joshua [name] MRN: [id number] DOB: 1981/10/15 Today's Date: 12/12/2019    History of Present Illness Pt is a 38 y.o. 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 S/p L frontoparietal craniectomy for evacuation of L basal ganglia hematoma 9/12. ETT 9/12-9/18, 9/18-9/23. PEG tube placed 9/28 secondary to dysphagia. PMH includes alcohol abuse, seizures, HTN.    PT Comments    Pt in bed upon arrival. Pt nonverbal t/o session and not following one step commands consistently. Pt resistant to movement today despite encouragement, assist and cuing. Pt repeatedly placing covers back over his legs and moving legs back onto the bed when assisted to EOB. LE therex performed in supine with mostly PROM on RLE with some AAROM. AAROM and AROM on LLE, pt had several spontaneous active heel slides, SLR and bridges on LLE. Pt required total assist for repositioning in bed, pad mgt, pad removal and self care after period of urinary incontinence. Pt able to partially unweight bottom with LLE bridge while grabbing at pad after incontinence. Pt required max multimodal cuing and assist for pericare. Pt presents with decreased strength, ROM, cognition, balance and activity tolerance limiting functional mobility. Pt will benefit from further acute PT and SNF following hospital discharge to improve deficits and decrease caregiver burden.    Follow Up Recommendations  SNF;Supervision/Assistance - 24 hour     Equipment Recommendations  Wheelchair (measurements PT);Wheelchair cushion (measurements PT);Hospital bed;3in1 (PT)    Recommendations for Other Services       Precautions / Restrictions Precautions Precautions: Fall Precaution Comments: L crani, PEG, L hand soft mitt, helmet Required Braces or Orthoses: Splint/Cast Splint/Cast: resting hand splint and elbow splint for R UE due synergistic pattern Restrictions Weight  Bearing Restrictions: No    Mobility  Bed Mobility Overal bed mobility: Needs Assistance Bed Mobility: Rolling           General bed mobility comments: total assist for rolling for pad mgt and changing pad after episode of urinary incontinence, pt very resistant against movement today despite multiple attempts to progress to sitting EOB  Transfers                    Ambulation/Gait                 Stairs             Wheelchair Mobility    Modified Rankin (Stroke Patients Only)       Balance                                            Cognition Arousal/Alertness: Awake/alert Behavior During Therapy: Agitated Overall Cognitive Status: Impaired/Different from baseline Area of Impairment: Attention;Following commands;Safety/judgement;Awareness;Problem solving                   Current Attention Level: Focused Memory: Decreased short-term memory Following Commands: Follows one step commands inconsistently Safety/Judgement: Decreased awareness of safety;Decreased awareness of deficits Awareness: Intellectual Problem Solving: Slow processing;Difficulty sequencing;Requires verbal cues;Decreased initiation;Requires tactile cues        Exercises Total Joint Exercises Ankle Circles/Pumps: AAROM;PROM;Both;10 reps Short Arc Quad: AAROM;PROM;Both;10 reps Heel Slides: PROM;AAROM;Both;10 reps Straight Leg Raises: PROM;AAROM;Both;10 reps Bridges: AROM;Left;5 reps(pt spontaneously performing small bridges on LLE in order to try and pull pads out from under him)  General Comments        Pertinent Vitals/Pain Faces Pain Scale: No hurt    Home Living                      Prior Function            PT Goals (current goals can now be found in the care plan section) Progress towards PT goals: Progressing toward goals    Frequency    Min 2X/week      PT Plan Current plan remains appropriate     Co-evaluation              AM-PAC PT "6 Clicks" Mobility   Outcome Measure  Help needed turning from your back to your side while in a flat bed without using bedrails?: Total Help needed moving from lying on your back to sitting on the side of a flat bed without using bedrails?: Total Help needed moving to and from a bed to a chair (including a wheelchair)?: Total Help needed standing up from a chair using your arms (e.g., wheelchair or bedside chair)?: Total Help needed to walk in hospital room?: Total Help needed climbing 3-5 steps with a railing? : Total 6 Click Score: 6    End of Session Equipment Utilized During Treatment: Gait belt Activity Tolerance: Other (comment)(pt resistant to all movement) Patient left: in bed;with call bell/phone within reach;with bed alarm set Nurse Communication: Mobility status PT Visit Diagnosis: Muscle weakness (generalized) (M62.81);Hemiplegia and hemiparesis;Other abnormalities of gait and mobility (R26.89) Hemiplegia - Right/Left: Right Hemiplegia - dominant/non-dominant: Dominant Hemiplegia - caused by: Nontraumatic intracerebral hemorrhage     Time: [id number] PT Time Calculation (min) (ACUTE ONLY): 22 min  Charges:  $Therapeutic Activity: 8-22 mins                     Callahan Peddie PT, DPT 11:25 AM,12/12/19    Corrado Hymon Drucilla Chalet 12/12/2019, 11:20 AM

## 2019-12-12 NOTE — Progress Notes (Signed)
[facility]  PROGRESS NOTE  Larren Playford [id number] DOB: 12-Jun-1981 DOA: 09/09/2019 PCP: Patient, [name] Admit date - 09/09/2019   Admitting Physician Erline Levine, MD  Outpatient Primary MD for the patient is Patient, [name]  LOS - 90 Brief Narrative   Stanwood Yassine is a 38 y.o. year old male with medical history significant for HTN, poorly controlled, alcoholism who presented on 09/09/2019.  Per chart review patient was at a smoking establishment and had witnessed "seizure like activity" for a few minutes followed by confusion afterwards.  On EMS arrival patient is found to have bowel/urine incontinence, no right upper or lower extremity movement, nonverbal, and not following commands with intermittent vomiting.  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    Hospital course complicated by rapidly declining neurologic exam (enlarged left pupil, right-sided hemiplegia, unconscious) patient underwent left craniotomy with decompressive craniectomy and evacuation of ICH by neurosurgery on 09/09/2019.  PEG tube placed on 9/28 secondary to dysphagia.  Subjective  Mr.  Johnes today has no acute events overnight. Nurse reports mild cough.  Remains nonverbal  No family at bedside today  A & P   1. Left hemispheric ICH.  Status post decompressive craniectomy and evacuation .  Dr. Vertell Limber (12/4) does not recommend surgery for flap replacement given patient's current clinical state, unless patient becomes more mobile.  Remains nonverbal with dense right-sided hemiplegia, able to move left side.  -PT/OT recommends SNF  -Continue supportive care, Seroquel  2. Seizures, secondary to above, stable  -Continue Keppra 500 mg twice daily  3. Aspiration pneumonia.  Currently n.p.o. given dysphagia  -All nutrition via PEG tube  -Completed 7-day course  of antibiotic (Zosyn, then Unasyn).  -Speech therapy signed off due to patient continued to refuse services  4. CKD stage IV.  Last Cr 1.28, normal output  -Interval check of BMP  5. HTN, SBP rising,   -Continue amlodipine 10 mg, metoprolol 100 mg twice daily,  -Increased hydralazine from 10 mg to 25 mg 3 times daily.  6. Severe protein calorie malnutrition  -Continue tube feeds  7. Mild anemia of CKD, hemoglobin has remained stable at baseline  -Interval check of CBC  8. History of alcoholism Completed clonidine taper for presumed alcohol withdrawal earlier in hospital stay  -Continue folate     Family Communication  :  Spoke with Jerelyn Charles at bedside on 12/12, no family at bedside on 12/15  Code Status : Full code  Disposition Plan  : Medically stable, still awaiting bed offers per case management for rehab stay at skilled nursing facility as patient is dependent on total care given dependency for all ADLs patient is now completely disabled, continue to require feeding via PEG tube, unable to make needs known given nonverbal.  Consults  : Neurology, PCCM, neurosurgery, surgery   Procedures  :    9/12 intubation  9/12 craniotomy  9/18 extubation  9/18 re-intubation  9/23 extubation  10/1 EGD PEG placement  DVT Prophylaxis  :  Lovenox   Lab Results  Component Value Date   PLT 286 12/11/2019    Diet :  Diet Order            Diet NPO time specified  Diet effective midnight               Inpatient Medications Scheduled Meds: . amLODipine  10 mg Per Tube Daily  . chlorhexidine  15 mL Mouth Rinse BID  . enoxaparin (LOVENOX) injection  40 mg Subcutaneous Daily  . feeding supplement (OSMOLITE 1.2 CAL)  474 mL Per Tube QID  . feeding supplement (PRO-STAT SUGAR FREE 64)  30 mL Per Tube BID  . folic acid  1 mg Per Tube Daily  . free water  300 mL Per Tube Q4H  . hydrALAZINE  25 mg Per Tube Q8H  . insulin aspart  0-15 Units Subcutaneous Q4H  .  levETIRAcetam  500 mg Per Tube BID  . mouth rinse  15 mL Mouth Rinse q12n4p  . metoprolol tartrate  100 mg Per Tube BID  . multivitamin  15 mL Per Tube Daily  . pantoprazole sodium  40 mg Per Tube BID  . QUEtiapine  50 mg Per Tube QHS  . sodium chloride flush  10-40 mL Intracatheter Q12H  . thiamine  100 mg Per Tube Daily   Continuous Infusions: PRN Meds:.acetaminophen **OR** acetaminophen, bisacodyl, glycopyrrolate, lip balm, [DISCONTINUED] ondansetron **OR** ondansetron (ZOFRAN) IV, polyethylene glycol, promethazine, sodium chloride flush  Antibiotics  :   Anti-infectives (From admission, onward)   Start     Dose/Rate Route Frequency Ordered Stop   09/27/19 1130  cephALEXin (KEFLEX) 250 MG/5ML suspension 500 mg     500 mg Per Tube Every 12 hours 09/27/19 1052 09/29/19 2250   09/25/19 1445  ceFAZolin (ANCEF) IVPB 2g/100 mL premix  Status:  Discontinued     2 g 200 mL/hr over 30 Minutes Intravenous Every 12 hours 09/25/19 1417 09/27/19 1052   09/14/19 1000  Ampicillin-Sulbactam (UNASYN) 3 g in sodium chloride 0.9 % 100 mL IVPB     3 g 200 mL/hr over 30 Minutes Intravenous Every 8 hours 09/14/19 0922 09/17/19 1816   09/13/19 1100  vancomycin (VANCOCIN) IVPB 750 mg/150 ml premix  Status:  Discontinued     750 mg 150 mL/hr over 60 Minutes Intravenous Every 24 hours 09/13/19 0728 09/13/19 0838   09/12/19 1100  vancomycin (VANCOCIN) IVPB 1000 mg/200 mL premix  Status:  Discontinued     1,000 mg 200 mL/hr over 60 Minutes Intravenous Every 24 hours 09/11/19 1034 09/13/19 0728   09/11/19 1130  piperacillin-tazobactam (ZOSYN) IVPB 3.375 g  Status:  Discontinued     3.375 g 12.5 mL/hr over 240 Minutes Intravenous Every 8 hours 09/11/19 1054 09/14/19 0845   09/11/19 1100  vancomycin (VANCOCIN) 1,500 mg in sodium chloride 0.9 % 500 mL IVPB     1,500 mg 250 mL/hr over 120 Minutes Intravenous  Once 09/11/19 1034 09/11/19 1900   09/11/19 1015  piperacillin-tazobactam (ZOSYN) IVPB 4.5 g  Status:   Discontinued     4.5 g 200 mL/hr over 30 Minutes Intravenous Every 8 hours 09/11/19 1010 09/11/19 1052   09/09/19 0845  ceFAZolin (ANCEF) IVPB 2g/100 mL premix     2 g 200 mL/hr over 30 Minutes Intravenous Every 8 hours 09/09/19 0842 09/09/19 1658       Objective   Vitals:   12/12/19 0402 12/12/19 0737 12/12/19 1128 12/12/19 1612  BP:  (!) 141/100 (!) 143/99 (!) 142/87  Pulse:  90 80 97  Resp:  18 18 18   Temp:  98.2 F (36.8 C) 98.2 F (36.8 C) 98.9 F (37.2 C)  TempSrc:  Oral Oral Oral  SpO2:  100% 100% 100%  Weight: 60.6 kg     Height:        SpO2: 100 % O2 Flow  Rate (L/min): 0 L/min FiO2 (%): 40 %  Wt Readings from Last 3 Encounters:  12/12/19 60.6 kg    No intake or output data in the 24 hours ending 12/12/19 1738  Physical Exam:  Awake Thin frail male, resting comfortably Left sided skull defect Right-sided hemiplegia, moving left upper and lower extremities without deficits Opens eyes to voice and gives eye contact, otherwise not following commands No appreciable new F.N deficits,  Symmetrical Chest wall movement, Good air movement bilaterally, CTAB on room air RRR,No Gallops,Rubs or new Murmurs,  Abdominal binder in place    I have personally reviewed the following:   Data Reviewed:  CBC Recent Labs  Lab 12/11/19 0242  WBC 6.5  HGB 11.1*  HCT 35.2*  PLT 286  MCV 84.4  MCH 26.6  MCHC 31.5  RDW 14.0    Chemistries  Recent Labs  Lab 12/11/19 0242  NA 141  K 4.3  CL 102  CO2 28  GLUCOSE 81  BUN 37*  CREATININE 1.28*  CALCIUM 9.9  AST 17  ALT 37  ALKPHOS 65  BILITOT 0.4   ------------------------------------------------------------------------------------------------------------------ No results for input(s): CHOL, HDL, LDLCALC, TRIG, CHOLHDL, LDLDIRECT in the last 72 hours.  Lab Results  Component Value Date   HGBA1C 5.6 09/10/2019    ------------------------------------------------------------------------------------------------------------------ No results for input(s): TSH, T4TOTAL, T3FREE, THYROIDAB in the last 72 hours.  Invalid input(s): FREET3 ------------------------------------------------------------------------------------------------------------------ No results for input(s): VITAMINB12, FOLATE, FERRITIN, TIBC, IRON, RETICCTPCT in the last 72 hours.  Coagulation profile No results for input(s): INR, PROTIME in the last 168 hours.  No results for input(s): DDIMER in the last 72 hours.  Cardiac Enzymes No results for input(s): CKMB, TROPONINI, MYOGLOBIN in the last 168 hours.  Invalid input(s): CK ------------------------------------------------------------------------------------------------------------------ No results found for: BNP  Micro Results No results found for this or any previous visit (from the past 240 hour(s)).  Radiology Reports No results found.   Time Spent in minutes  30     Desiree Hane M.D on 12/12/2019 at 5:38 PM  To page go to [email or web address] - password [email or web address]

## 2019-12-13 LAB — GLUCOSE, CAPILLARY
Glucose-Capillary: 100 mg/dL — ABNORMAL HIGH (ref 70–99)
Glucose-Capillary: 105 mg/dL — ABNORMAL HIGH (ref 70–99)
Glucose-Capillary: 124 mg/dL — ABNORMAL HIGH (ref 70–99)
Glucose-Capillary: 170 mg/dL — ABNORMAL HIGH (ref 70–99)
Glucose-Capillary: 79 mg/dL (ref 70–99)
Glucose-Capillary: 88 mg/dL (ref 70–99)

## 2019-12-13 NOTE — Evaluation (Signed)
Clinical/Bedside Swallow Evaluation Patient Details  Name: Joshua [name] MRN: [id number] Date of Birth: Jan 14, 1981  Today's Date: 12/13/2019 Time: SLP Start Time (ACUTE ONLY): 0820 SLP Stop Time (ACUTE ONLY): 0830 SLP Time Calculation (min) (ACUTE ONLY): 10 min  Past Medical History: History reviewed. No pertinent past medical history. Past Surgical History:  Past Surgical History:  Procedure Laterality Date  . CRANIOTOMY Left 09/09/2019   Procedure: DECOMPRESSIVE CRANIECTOMY AND  HEMATOMA EVACUATION, IMPLANTATION OF SKULL FLAP IN RIGHT ABDOMEN;  Surgeon: Erline Levine, MD;  Location: [place];  Service: Neurosurgery;  Laterality: Left;  . ESOPHAGOGASTRODUODENOSCOPY (EGD) WITH PROPOFOL N/A 09/28/2019   Procedure: ESOPHAGOGASTRODUODENOSCOPY (EGD) WITH PROPOFOL;  Surgeon: Georganna Skeans, MD;  Location: [facility] ENDOSCOPY;  Service: General;  Laterality: N/A;  . PEG PLACEMENT N/A 09/28/2019   Procedure: PERCUTANEOUS ENDOSCOPIC GASTROSTOMY (PEG) PLACEMENT;  Surgeon: Georganna Skeans, MD;  Location: [facility] ENDOSCOPY;  Service: General;  Laterality: N/A;   HPI:  Pt is a 38 year old male who presented with new seizure, and was found to have a large left BG intracranial hemorrhage s/p L frontal temporal craniectomy with evacuation 9/12. ETT 9/12-9/18; 9/18-9/23. Has been seen repeatedly by SLP, pts attention impacts ability to take PO, resists assistance, has been NPO since admit. Has recently shown interest in water, SLP reconsulted. PMH: HTN, alcoholism, noncompliance   Assessment / Plan / Recommendation Clinical Impression  Pt alert upon arrival, intermittently and briefly focuses attention to SLP. Instead of attempting to feed pt, SLP passed him a cup of water, which he reached for. He would not take a sip until i released the cup. He did appropriately purse lips to cup and take sip, though he did spill water onto his chest on the right (labial seal not perfect, but adequate, more of a feeding issue than oral  impairment). Pt took several small sips of water and coke from a bottle and swallow elicited. There was also a very delayed soft intermittent cough that may or may not be associated with aspiration. Pt is also able to masticate small snack crackers without oral residue observed. Pt would not be capable of particiapting in objective assesment given very minimal willingness to consume PO and extreme resistance to any assist or interference from providers.  RN and MD observed session and all agreed to allow bites and sips of food and liquid for comfort though risk of aspiration may be present. Will monitor pt closely for tolerance.  SLP Visit Diagnosis: Dysphagia, unspecified (R13.10)    Aspiration Risk  Moderate aspiration risk;Risk for inadequate nutrition/hydration    Diet Recommendation Regular;Thin liquid   Liquid Administration via: Cup Medication Administration: Via alternative means Supervision: Staff to assist with self feeding Compensations: Minimize environmental distractions(allow pt to attempt to feed himself, even if messy) Postural Changes: Seated upright at 90 degrees    Other  Recommendations Oral Care Recommendations: Oral care BID Other Recommendations: Have oral suction available   Follow up Recommendations Skilled Nursing facility      Frequency and Duration min 2x/week  2 weeks       Prognosis Prognosis for Safe Diet Advancement: Guarded Barriers to Reach Goals: Behavior      Swallow Study   General HPI: Pt is a 38 year old male who presented with new seizure, and was found to have a large left BG intracranial hemorrhage s/p L frontal temporal craniectomy with evacuation 9/12. ETT 9/12-9/18; 9/18-9/23. Has been seen repeatedly by SLP, pts attention impacts ability to take PO, resists assistance, has been NPO since admit.  Has recently shown interest in water, SLP reconsulted. PMH: HTN, alcoholism, noncompliance Type of Study: Bedside Swallow Evaluation Previous  Swallow Assessment: BSE 09/22/2019 Diet Prior to this Study: NPO Temperature Spikes Noted: No Respiratory Status: Room air History of Recent Intubation: No Behavior/Cognition: Alert;Doesn't follow directions;Uncooperative Oral Care Completed by SLP: No(does not allow) Oral Cavity - Dentition: Missing dentition Self-Feeding Abilities: Needs set up Patient Positioning: Upright in bed Volitional Cough: Cognitively unable to elicit Volitional Swallow: Unable to elicit    Oral/Motor/Sensory Function Overall Oral Motor/Sensory Function: Other (comment)(does not follow commands, suspect right lingual weakness)   Ice Chips Ice chips: Not tested   Thin Liquid Thin Liquid: Impaired Oral Phase Impairments: Reduced labial seal Oral Phase Functional Implications: Right anterior spillage Pharyngeal  Phase Impairments: Cough - Delayed    Nectar Thick Nectar Thick Liquid: Not tested   Honey Thick Honey Thick Liquid: Not tested   Puree Puree: Not tested   Solid     Solid: Impaired Presentation: Self Fed Oral Phase Impairments: Impaired mastication Oral Phase Functional Implications: Prolonged oral transit;Impaired mastication     Herbie Baltimore, MA CCC-SLP  Acute Rehabilitation Services Pager [phone] Office [phone]  Lynann Beaver 12/13/2019,8:54 AM

## 2019-12-13 NOTE — Progress Notes (Signed)
PROGRESS NOTE    Joshua [name]  [id number] DOB: December 06, 1981 DOA: 09/09/2019 PCP: Patient, [name]     Brief Narrative:  Joshua [name] is a 38 year old male admitted by neurology on 9/12 with seizure, right hemiplegia, decreased responsiveness found to have left hemispheric intracranial hemorrhage, subsequently had a rapidly declining neurological status, intubated and admitted to ICU, underwent left craniotomy for decompression and evacuation of ICH on 9/12 by Dr. Vertell Limber. Continues to have slow neurological recovery, prolonged hospitalization treated for seizures and aspiration pneumonia.  Continues to be awake and alert, but aphasic. PEG tube placement on 9/28. Disposition continues to be challenging, uninsured, needs long-term placement.  New events last 24 hours / Subjective: Patient reevaluated by SLP today.  He had intermittent and brief focus to take liquid and cracker by mouth.  Decided to advance his p.o. intake to regular diet for comfort, although there is still a risk of aspiration present.  Patient to continue PEG/tube feeding for full nutrition as p.o. intake will not be adequate  Assessment & Plan:   Principal Problem:   ICH (intracerebral hemorrhage) (HCC) Active Problems:   Subdural hematoma (HCC)   Intracerebral hemorrhage (HCC)   Cytotoxic brain edema (HCC)   Hemorrhagic stroke (HCC)   Acute respiratory failure ([place])   Essential hypertension   Severe protein-calorie malnutrition ([place])   CKD (chronic kidney disease), stage IV (HCC)   Seizure (HCC)    Left intracranial hemorrhage  Patient underwent decompressive craniectomy and hematoma evacuation with implantation of skull flap in the right abdomen by Dr. Vertell Limber in 9/12. Felt to be secondary to uncontrolled, untreated hypertension, CTA was negative for aneurysm or vascular malformation. Remains aphasic/nonverbal with dense right hemiplegia, cognitive impairment, inconsistently follows commands. Previous  hospitalist discussed with Dr. Vertell Limber 12/2 regarding flap replacement; given his bedridden status and lack of progress since surgery, no indication for replacement of his skull at this time as would only be a cosmetic fix -PT/OT following, recommending SNF -Social work/case management following for placement  Seizure Secondary to above ICH, continue Keppra 500mg  BID  Aspiration pneumonia Dysphagia Sputum culture 09/11/2019 with normal oral flora.  Completed 7-day course of antibiotics during initial hospitalization. PEG tube placed for dysphagia.   -Continue tube feeds per nutrition -Trial p.o. intake for comfort  Klebsiella oxytoca UTI -Completed course of cefazolin  Essential hypertension -Stable, continue amlodipine, hydralazine, lopressor   Severe protein calorie malnutrition -Continue tube feeds  Stage 2 CKD:  Creatinine at baseline, around 1.5. Check BMP intermittently.    Mild anemia of chronic disease: Hemoglobin stable. Check CBC intermittently.    DVT prophylaxis: Lovenox Code Status: Full Family Communication: None at bedside Disposition Plan: Difficult placement patient is medically stable, is dependent on total care including PEG for enteral nutrition.  Patient remains nonverbal and unable to make needs known.  Awaiting SNF placement    Antimicrobials:  Anti-infectives (From admission, onward)   Start     Dose/Rate Route Frequency Ordered Stop   09/27/19 1130  cephALEXin (KEFLEX) 250 MG/5ML suspension 500 mg     500 mg Per Tube Every 12 hours 09/27/19 1052 09/29/19 2250   09/25/19 1445  ceFAZolin (ANCEF) IVPB 2g/100 mL premix  Status:  Discontinued     2 g 200 mL/hr over 30 Minutes Intravenous Every 12 hours 09/25/19 1417 09/27/19 1052   09/14/19 1000  Ampicillin-Sulbactam (UNASYN) 3 g in sodium chloride 0.9 % 100 mL IVPB     3 g 200 mL/hr over 30 Minutes Intravenous  Every 8 hours 09/14/19 0922 09/17/19 1816   09/13/19 1100  vancomycin (VANCOCIN) IVPB  750 mg/150 ml premix  Status:  Discontinued     750 mg 150 mL/hr over 60 Minutes Intravenous Every 24 hours 09/13/19 0728 09/13/19 0838   09/12/19 1100  vancomycin (VANCOCIN) IVPB 1000 mg/200 mL premix  Status:  Discontinued     1,000 mg 200 mL/hr over 60 Minutes Intravenous Every 24 hours 09/11/19 1034 09/13/19 0728   09/11/19 1130  piperacillin-tazobactam (ZOSYN) IVPB 3.375 g  Status:  Discontinued     3.375 g 12.5 mL/hr over 240 Minutes Intravenous Every 8 hours 09/11/19 1054 09/14/19 0845   09/11/19 1100  vancomycin (VANCOCIN) 1,500 mg in sodium chloride 0.9 % 500 mL IVPB     1,500 mg 250 mL/hr over 120 Minutes Intravenous  Once 09/11/19 1034 09/11/19 1900   09/11/19 1015  piperacillin-tazobactam (ZOSYN) IVPB 4.5 g  Status:  Discontinued     4.5 g 200 mL/hr over 30 Minutes Intravenous Every 8 hours 09/11/19 1010 09/11/19 1052   09/09/19 0845  ceFAZolin (ANCEF) IVPB 2g/100 mL premix     2 g 200 mL/hr over 30 Minutes Intravenous Every 8 hours 09/09/19 0842 09/09/19 1658       Objective: Vitals:   12/13/19 0049 12/13/19 0354 12/13/19 0415 12/13/19 0807  BP: 116/77 101/72  118/84  Pulse: (!) 103 99  94  Resp: 19 17    Temp: 98.8 F (37.1 C) 98 F (36.7 C)  98.2 F (36.8 C)  TempSrc: Oral Oral  Oral  SpO2: 99% 100%  100%  Weight:   58.9 kg   Height:       No intake or output data in the 24 hours ending 12/13/19 1205 Filed Weights   12/11/19 0500 12/12/19 0402 12/13/19 0415  Weight: 59.8 kg 60.6 kg 58.9 kg    Examination: General exam: Appears calm and comfortable  Respiratory system: Clear to auscultation. Respiratory effort normal. Cardiovascular system: S1 & S2 heard, RRR. No pedal edema. Gastrointestinal system: Abdomen is nondistended, soft and nontender. Normal bowel sounds heard. Central nervous system: Alert, nonverbal Extremities: Symmetric in appearance bilaterally  Skin: No rashes, lesions or ulcers on exposed skin     Data Reviewed: I have personally  reviewed following labs and imaging studies  CBC: Recent Labs  Lab 12/11/19 0242  WBC 6.5  HGB 11.1*  HCT 35.2*  MCV 84.4  PLT [id number]   Basic Metabolic Panel: Recent Labs  Lab 12/11/19 0242  NA 141  K 4.3  CL 102  CO2 28  GLUCOSE 81  BUN 37*  CREATININE 1.28*  CALCIUM 9.9   GFR: Estimated Creatinine Clearance: 64.5 mL/min (A) (by C-G formula based on SCr of 1.28 mg/dL (H)). Liver Function Tests: Recent Labs  Lab 12/11/19 0242  AST 17  ALT 37  ALKPHOS 65  BILITOT 0.4  PROT 6.6  ALBUMIN 3.0*   No results for input(s): LIPASE, AMYLASE in the last 168 hours. No results for input(s): AMMONIA in the last 168 hours. Coagulation Profile: No results for input(s): INR, PROTIME in the last 168 hours. Cardiac Enzymes: No results for input(s): CKTOTAL, CKMB, CKMBINDEX, TROPONINI in the last 168 hours. BNP (last 3 results) No results for input(s): PROBNP in the last 8760 hours. HbA1C: No results for input(s): HGBA1C in the last 72 hours. CBG: Recent Labs  Lab 12/12/19 1608 12/12/19 1956 12/13/19 0047 12/13/19 0352 12/13/19 0733  GLUCAP 128* 123* 170* 79 100*   Lipid  Profile: No results for input(s): CHOL, HDL, LDLCALC, TRIG, CHOLHDL, LDLDIRECT in the last 72 hours. Thyroid Function Tests: No results for input(s): TSH, T4TOTAL, FREET4, T3FREE, THYROIDAB in the last 72 hours. Anemia Panel: No results for input(s): VITAMINB12, FOLATE, FERRITIN, TIBC, IRON, RETICCTPCT in the last 72 hours. Sepsis Labs: No results for input(s): PROCALCITON, LATICACIDVEN in the last 168 hours.  No results found for this or any previous visit (from the past 240 hour(s)).    Radiology Studies: No results found.    Scheduled Meds: . amLODipine  10 mg Per Tube Daily  . chlorhexidine  15 mL Mouth Rinse BID  . enoxaparin (LOVENOX) injection  40 mg Subcutaneous Daily  . feeding supplement (OSMOLITE 1.2 CAL)  474 mL Per Tube QID  . feeding supplement (PRO-STAT SUGAR FREE 64)  30 mL  Per Tube BID  . folic acid  1 mg Per Tube Daily  . free water  300 mL Per Tube Q4H  . hydrALAZINE  25 mg Per Tube Q8H  . insulin aspart  0-15 Units Subcutaneous Q4H  . levETIRAcetam  500 mg Per Tube BID  . mouth rinse  15 mL Mouth Rinse q12n4p  . metoprolol tartrate  100 mg Per Tube BID  . multivitamin  15 mL Per Tube Daily  . pantoprazole sodium  40 mg Per Tube BID  . QUEtiapine  50 mg Per Tube QHS  . sodium chloride flush  10-40 mL Intracatheter Q12H  . thiamine  100 mg Per Tube Daily   Continuous Infusions:   LOS: 95 days      Time spent: 25 minutes   Dessa Phi, DO [facility] 12/13/2019, 12:05 PM   Available via Epic secure chat 7am-7pm After these hours, please refer to coverage provider listed on [email or web address]

## 2019-12-13 NOTE — Final Progress Note (Signed)
As I was giving Pt water per tube, Pt reached out to the string, I let him have it. He stick the string to his mouth, drink water from the string, he neither coughed nor choked.  I guess he might b ready for PO intake

## 2019-12-14 LAB — GLUCOSE, CAPILLARY
Glucose-Capillary: 83 mg/dL (ref 70–99)
Glucose-Capillary: 100 mg/dL — ABNORMAL HIGH (ref 70–99)
Glucose-Capillary: 106 mg/dL — ABNORMAL HIGH (ref 70–99)

## 2019-12-14 NOTE — Progress Notes (Signed)
Physical Therapy Treatment Patient Details Name: Joshua [name] MRN: [id number] DOB: 14-Oct-1981 Today's Date: 12/14/2019    History of Present Illness Pt is a 38 y.o. 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 S/p L frontoparietal craniectomy for evacuation of L basal ganglia hematoma 9/12. ETT 9/12-9/18, 9/18-9/23. PEG tube placed 9/28 secondary to dysphagia. PMH includes alcohol abuse, seizures, HTN.    PT Comments    Pt progressing towards physical therapy goals. Benefits from anterior support with sitting balance to decreased posterior lean. Continues with right lateral lean. Able to perform stand pivot transfer with two person maximal assist into tilt in space chair. Will continue to progress as tolerated.    Follow Up Recommendations  SNF;Supervision/Assistance - 24 hour     Equipment Recommendations  Wheelchair (measurements PT);Wheelchair cushion (measurements PT);Hospital bed;3in1 (PT)    Recommendations for Other Services       Precautions / Restrictions Precautions Precautions: Fall Precaution Comments: L crani, PEG, helmet Restrictions Weight Bearing Restrictions: No    Mobility  Bed Mobility Overal bed mobility: Needs Assistance Bed Mobility: Supine to Sit     Supine to sit: Total assist     General bed mobility comments: TotalA for coming to edge of bed, use of bed pad to bring hips forward  Transfers Overall transfer level: Needs assistance Equipment used: None Transfers: Sit to/from [facility] Sit to Stand: Max assist;+2 physical assistance Stand pivot transfers: Max assist;+2 physical assistance       General transfer comment: MaxA + 2 to stand from edge of bed and pivot towards left, hand over hand guidance for reaching for chair arm with LUE, manual assist for positioning of BLE's. Legs in crouched position with increased knee flexion  Ambulation/Gait                  Stairs             Wheelchair Mobility    Modified Rankin (Stroke Patients Only) Modified Rankin (Stroke Patients Only) Pre-Morbid Rankin Score: No symptoms Modified Rankin: Severe disability     Balance Overall balance assessment: Needs assistance Sitting-balance support: Feet supported;Single extremity supported Sitting balance-Leahy Scale: Poor Sitting balance - Comments: Pt able to achieve brief periods of min guard, but otherwise requiring up to max assist due to posterior and right lateral lean. Use of LUE on recliner to promote anterior weight shift                                    Cognition Arousal/Alertness: Awake/alert Behavior During Therapy: Flat affect Overall Cognitive Status: Impaired/Different from baseline Area of Impairment: Attention;Following commands;Safety/judgement;Awareness;Problem solving                   Current Attention Level: Focused   Following Commands: Follows one step commands inconsistently Safety/Judgement: Decreased awareness of safety;Decreased awareness of deficits Awareness: Intellectual Problem Solving: Slow processing;Difficulty sequencing;Requires verbal cues;Decreased initiation;Requires tactile cues General Comments: Pt follows ~25-50% of one step commands, non verbal, unable to consistently blink to respond or utilize head nods.       Exercises      General Comments        Pertinent Vitals/Pain Pain Assessment: Faces Faces Pain Scale: No hurt    Home Living  Prior Function            PT Goals (current goals can now be found in the care plan section) Acute Rehab PT Goals PT Goal Formulation: Patient unable to participate in goal setting Time For Goal Achievement: 12/28/19 Potential to Achieve Goals: Fair Progress towards PT goals: Progressing toward goals    Frequency    Min 2X/week      PT Plan Current plan remains appropriate     Co-evaluation PT/OT/SLP Co-Evaluation/Treatment: Yes Reason for Co-Treatment: Necessary to address cognition/behavior during functional activity;Complexity of the patient's impairments (multi-system involvement);For patient/therapist safety;To address functional/ADL transfers PT goals addressed during session: Mobility/safety with mobility        AM-PAC PT "6 Clicks" Mobility   Outcome Measure  Help needed turning from your back to your side while in a flat bed without using bedrails?: Total Help needed moving from lying on your back to sitting on the side of a flat bed without using bedrails?: Total Help needed moving to and from a bed to a chair (including a wheelchair)?: Total Help needed standing up from a chair using your arms (e.g., wheelchair or bedside chair)?: Total Help needed to walk in hospital room?: Total Help needed climbing 3-5 steps with a railing? : Total 6 Click Score: 6    End of Session Equipment Utilized During Treatment: Gait belt Activity Tolerance: Patient tolerated treatment well Patient left: Other (comment)(in tilt in space chair; at nursing station) Nurse Communication: Mobility status PT Visit Diagnosis: Muscle weakness (generalized) (M62.81);Hemiplegia and hemiparesis;Other abnormalities of gait and mobility (R26.89) Hemiplegia - Right/Left: Right Hemiplegia - dominant/non-dominant: Dominant Hemiplegia - caused by: Nontraumatic intracerebral hemorrhage     Time: 1406-1440 PT Time Calculation (min) (ACUTE ONLY): 34 min  Charges:  $Therapeutic Activity: 8-22 mins                     Ellamae Sia, PT, DPT Acute Rehabilitation Services Pager [phone] Office [phone]    Willy Eddy 12/14/2019, 3:27 PM

## 2019-12-14 NOTE — Progress Notes (Signed)
Nutrition Follow-up  DOCUMENTATION CODES:   Severe malnutrition in context of acute illness/injury  INTERVENTION:  Continue bolus tube feeds usingOsmolite 1.2 formula via PEG at goal volume of 474 ml (2 cartons/ARCs) given QID.   Provide 30 ml Prostat BID per tube.   Free water flushes of 300 ml every 4 hours per tube. (MD to adjust as appropriate)  Tube feeding to provide 2475 kcal, 135 grams of protein, and 3355 ml free water.  Continue trial of PO intake for comfort as appropriate/tolerated.  NUTRITION DIAGNOSIS:   Severe Malnutrition related to acute illness(intracerebral hemorrage) as evidenced by severe fat depletion, severe muscle depletion; ongoing  GOAL:   Patient will meet greater than or equal to 90% of their needs; met with TF  MONITOR:   TF tolerance, Skin, Weight trends, Labs, I & O's  REASON FOR ASSESSMENT:   Ventilator, Consult Enteral/tube feeding initiation and management  ASSESSMENT:   38 yo male admitted with large left intracranial hemorrhage; S/P craniotomy. PMH includes HTN, alcoholism, noncompliance.  9/12left frontal temporal craniectomy and evacuation hematoma.  9/23 Extubated.  10/1 PEG placed 11/23- Nutrition Focused Physical Exam performed, severe subcutaneous fat loss and severe muscle wasting identified.  SLP following and pt currently on trial of po intake for comfort though risk of aspiration may be present. Pt continues on tube feeding regimen to ensure nutrition is adequately met. Pt tolerating his bolus feeds well. RD to continue to monitor for tolerance.   Labs and medications reviewed.   Diet Order:   Diet Order            Diet regular Room service appropriate? Yes; Fluid consistency: Thin  Diet effective now              EDUCATION NEEDS:   Not appropriate for education at this time  Skin:  Skin Assessment: Reviewed RN Assessment  Last BM:  12/16  Height:   Ht Readings from Last 1 Encounters:  09/09/19 5'  9" (1.753 m)    Weight:   Wt Readings from Last 1 Encounters:  12/13/19 58.9 kg    Ideal Body Weight:  72.7 kg  BMI:  Body mass index is 19.18 kg/m.  Estimated Nutritional Needs:   Kcal:  2100-2400  Protein:  105-125 grams  Fluid:  >/= 2 L/day    Corrin Parker, MS, RD, LDN Pager # [phone] After hours/ weekend pager # [phone]

## 2019-12-14 NOTE — Plan of Care (Signed)
Patient progressing towards plan of care goals. 

## 2019-12-14 NOTE — Progress Notes (Signed)
Occupational Therapy Treatment Patient Details Name: Joshua [name] MRN: [id number] DOB: 1981/10/21 Today's Date: 12/14/2019    History of present illness Pt is a 38 y.o. 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 S/p L frontoparietal craniectomy for evacuation of L basal ganglia hematoma 9/12. ETT 9/12-9/18, 9/18-9/23. PEG tube placed 9/28 secondary to dysphagia. PMH includes alcohol abuse, seizures, HTN.   OT comments  Pt making gradual progress towards OT goals this session. Session focus on seated grooming and functional transfer training. Pt completed seated grooming from EOB, noted to reach for wash cloth placed on bed this session and attempt washing face and legs needing MIN A for completion of task. Pt able to complete stand pivot transfer to TIS with MAX A +2. Pt noted to reach for arm rest with LUE during transfer this session. DC plan remains appropriate, will continue to follow acutely per POC.    Follow Up Recommendations  SNF;Supervision/Assistance - 24 hour    Equipment Recommendations  None recommended by OT    Recommendations for Other Services      Precautions / Restrictions Precautions Precautions: Fall Precaution Comments: L crani, PEG, helmet Required Braces or Orthoses: Splint/Cast Splint/Cast: resting hand splint and elbow splint for R UE due synergistic pattern Restrictions Weight Bearing Restrictions: No       Mobility Bed Mobility Overal bed mobility: Needs Assistance Bed Mobility: Supine to Sit     Supine to sit: Total assist;+2 for physical assistance     General bed mobility comments: TotalA for coming to edge of bed, use of bed pad to bring hips forward  Transfers Overall transfer level: Needs assistance Equipment used: None Transfers: Sit to/from [facility] Sit to Stand: Max assist;+2 physical assistance Stand pivot transfers: Max assist;+2 physical assistance        General transfer comment: MaxA + 2 to stand from edge of bed and pivot towards left, hand over hand guidance for reaching for chair arm with LUE, manual assist for positioning of BLE's. Legs in crouched position with increased knee flexion    Balance Overall balance assessment: Needs assistance Sitting-balance support: Feet supported;Single extremity supported Sitting balance-Leahy Scale: Poor Sitting balance - Comments: Pt able to achieve brief periods of min guard, but otherwise requiring up to max assist due to posterior and right lateral lean. Use of LUE on recliner to promote anterior weight shift                                   ADL either performed or assessed with clinical judgement   ADL Overall ADL's : Needs assistance/impaired     Grooming: Wash/dry face;Supervision/safety;Set up;Minimal assistance;Sitting Grooming Details (indicate cue type and reason): pt able to pick up wash cloth from bed with LUE and wash legs/ face minimally from seated EOB                 Toilet Transfer: Maximal assistance;+2 for physical assistance;+2 for safety/equipment Toilet Transfer Details (indicate cue type and reason): simulated to TIS         Functional mobility during ADLs: Maximal assistance;+2 for physical assistance;+2 for safety/equipment;Total assistance General ADL Comments: session focus on seated grooming with more initiation noted, continues to require MAX- total A for functional transfers     Vision Patient Visual Report: No change from baseline     Perception     Praxis  Cognition Arousal/Alertness: Awake/alert Behavior During Therapy: Flat affect Overall Cognitive Status: Impaired/Different from baseline Area of Impairment: Attention;Following commands;Safety/judgement;Awareness;Problem solving                   Current Attention Level: Focused   Following Commands: Follows one step commands inconsistently Safety/Judgement:  Decreased awareness of safety;Decreased awareness of deficits Awareness: Intellectual Problem Solving: Slow processing;Difficulty sequencing;Requires verbal cues;Decreased initiation;Requires tactile cues General Comments: Pt follows ~25-50% of one step commands, non verbal, unable to consistently blink to respond or utilize head nods.         Exercises     Shoulder Instructions       General Comments      Pertinent Vitals/ Pain       Pain Assessment: Faces Faces Pain Scale: No hurt  Home Living                                          Prior Functioning/Environment              Frequency  Min 2X/week        Progress Toward Goals  OT Goals(current goals can now be found in the care plan section)  Progress towards OT goals: Progressing toward goals  Acute Rehab OT Goals Patient Stated Goal: none stated OT Goal Formulation: Patient unable to participate in goal setting Time For Goal Achievement: 12/19/19 Potential to Achieve Goals: Good  Plan Discharge plan remains appropriate    Co-evaluation    PT/OT/SLP Co-Evaluation/Treatment: Yes Reason for Co-Treatment: Complexity of the patient's impairments (multi-system involvement);Necessary to address cognition/behavior during functional activity;For patient/therapist safety;To address functional/ADL transfers PT goals addressed during session: Mobility/safety with mobility OT goals addressed during session: ADL's and self-care      AM-PAC OT "6 Clicks" Daily Activity     Outcome Measure   Help from another person eating meals?: Total Help from another person taking care of personal grooming?: A Lot Help from another person toileting, which includes using toliet, bedpan, or urinal?: A Lot Help from another person bathing (including washing, rinsing, drying)?: Total Help from another person to put on and taking off regular upper body clothing?: Total Help from another person to put on and  taking off regular lower body clothing?: Total 6 Click Score: 8    End of Session Equipment Utilized During Treatment: Gait belt;Other (comment)(TIS, helmet)  OT Visit Diagnosis: Other abnormalities of gait and mobility (R26.89);Hemiplegia and hemiparesis;Cognitive communication deficit (R41.841) Symptoms and signs involving cognitive functions: Other Nontraumatic ICH Hemiplegia - Right/Left: Right Hemiplegia - caused by: Other Nontraumatic intracranial hemorrhage   Activity Tolerance Patient tolerated treatment well   Patient Left Other (comment)(in TIS w/c at nurses station)   Nurse Communication Mobility status;Other (comment)(elbow splint needs to be put back on once back in bed)        Time: 1406-1440 OT Time Calculation (min): 34 min  Charges: OT General Charges $OT Visit: 1 Visit OT Treatments $Self Care/Home Management : 8-22 mins  Lanier Clam., COTA/L Acute Rehabilitation Services [phone]    Ihor Gully 12/14/2019, 4:23 PM

## 2019-12-14 NOTE — Progress Notes (Signed)
PROGRESS NOTE    Joshua [name]  [id number] DOB: [birth year] DOA: 09/09/2019 PCP: Patient, [name]     Brief Narrative:  Joshua [name] is a 38 year old male admitted by neurology on 9/12 with seizure, right hemiplegia, decreased responsiveness found to have left hemispheric intracranial hemorrhage, subsequently had a rapidly declining neurological status, intubated and admitted to ICU, underwent left craniotomy for decompression and evacuation of ICH on 9/12 by Dr. Vertell Limber. Continues to have slow neurological recovery, prolonged hospitalization treated for seizures and aspiration pneumonia.  Continues to be awake and alert, but aphasic. PEG tube placement on 9/28. Disposition continues to be challenging, uninsured, needs long-term placement.  New events last 24 hours / Subjective: No acute events overnight.  Remains aphasic.  Assessment & Plan:   Principal Problem:   ICH (intracerebral hemorrhage) (HCC) Active Problems:   Subdural hematoma (HCC)   Intracerebral hemorrhage (HCC)   Cytotoxic brain edema (HCC)   Hemorrhagic stroke (HCC)   Acute respiratory failure ([place])   Essential hypertension   Severe protein-calorie malnutrition ([place])   CKD (chronic kidney disease), stage IV (HCC)   Seizure (HCC)    Left intracranial hemorrhage  Patient underwent decompressive craniectomy and hematoma evacuation with implantation of skull flap in the right abdomen by Dr. Vertell Limber in 9/12. Felt to be secondary to uncontrolled, untreated hypertension, CTA was negative for aneurysm or vascular malformation. Remains aphasic/nonverbal with dense right hemiplegia, cognitive impairment, inconsistently follows commands. Previous hospitalist discussed with Dr. Vertell Limber 12/2 regarding flap replacement; given his bedridden status and lack of progress since surgery, no indication for replacement of his skull at this time as would only be a cosmetic fix -PT/OT following, recommending SNF -Social work/case  management following for placement  Seizure Secondary to above ICH, continue Keppra 500mg  BID  Aspiration pneumonia Dysphagia Sputum culture 09/11/2019 with normal oral flora.  Completed 7-day course of antibiotics during initial hospitalization. PEG tube placed for dysphagia.   -Continue tube feeds per nutrition -Trial p.o. intake for comfort  Klebsiella oxytoca UTI -Completed course of cefazolin  Essential hypertension -Stable, continue amlodipine, hydralazine, lopressor   Severe protein calorie malnutrition -Continue tube feeds  Stage 2 CKD:  Creatinine at baseline, around 1.5. Check BMP intermittently.    Mild anemia of chronic disease: Hemoglobin stable. Check CBC intermittently.    DVT prophylaxis: Lovenox Code Status: Full Family Communication: None at bedside Disposition Plan: Difficult placement patient is medically stable, is dependent on total care including PEG for enteral nutrition.  Patient remains nonverbal and unable to make needs known.  Awaiting SNF placement      Antimicrobials:  Anti-infectives (From admission, onward)   Start     Dose/Rate Route Frequency Ordered Stop   09/27/19 1130  cephALEXin (KEFLEX) 250 MG/5ML suspension 500 mg     500 mg Per Tube Every 12 hours 09/27/19 1052 09/29/19 2250   09/25/19 1445  ceFAZolin (ANCEF) IVPB 2g/100 mL premix  Status:  Discontinued     2 g 200 mL/hr over 30 Minutes Intravenous Every 12 hours 09/25/19 1417 09/27/19 1052   09/14/19 1000  Ampicillin-Sulbactam (UNASYN) 3 g in sodium chloride 0.9 % 100 mL IVPB     3 g 200 mL/hr over 30 Minutes Intravenous Every 8 hours 09/14/19 0922 09/17/19 1816   09/13/19 1100  vancomycin (VANCOCIN) IVPB 750 mg/150 ml premix  Status:  Discontinued     750 mg 150 mL/hr over 60 Minutes Intravenous Every 24 hours 09/13/19 0728 09/13/19 0838   09/12/19 1100  vancomycin (VANCOCIN) IVPB 1000 mg/200 mL premix  Status:  Discontinued     1,000 mg 200 mL/hr over 60 Minutes  Intravenous Every 24 hours 09/11/19 1034 09/13/19 0728   09/11/19 1130  piperacillin-tazobactam (ZOSYN) IVPB 3.375 g  Status:  Discontinued     3.375 g 12.5 mL/hr over 240 Minutes Intravenous Every 8 hours 09/11/19 1054 09/14/19 0845   09/11/19 1100  vancomycin (VANCOCIN) 1,500 mg in sodium chloride 0.9 % 500 mL IVPB     1,500 mg 250 mL/hr over 120 Minutes Intravenous  Once 09/11/19 1034 09/11/19 1900   09/11/19 1015  piperacillin-tazobactam (ZOSYN) IVPB 4.5 g  Status:  Discontinued     4.5 g 200 mL/hr over 30 Minutes Intravenous Every 8 hours 09/11/19 1010 09/11/19 1052   09/09/19 0845  ceFAZolin (ANCEF) IVPB 2g/100 mL premix     2 g 200 mL/hr over 30 Minutes Intravenous Every 8 hours 09/09/19 0842 09/09/19 1658       Objective: Vitals:   12/13/19 1628 12/13/19 1921 12/14/19 0425 12/14/19 0910  BP: 121/88 (!) 120/95 112/80 115/78  Pulse: (!) 102 (!) 110 83 98  Resp: 18 18 17 20   Temp: 97.8 F (36.6 C) 98.1 F (36.7 C) 97.9 F (36.6 C) 97.9 F (36.6 C)  TempSrc: Oral Oral Oral Oral  SpO2: 100% 100% 98% 98%  Weight:      Height:       No intake or output data in the 24 hours ending 12/14/19 1120 Filed Weights   12/11/19 0500 12/12/19 0402 12/13/19 0415  Weight: 59.8 kg 60.6 kg 58.9 kg    Examination: General exam: Appears calm and comfortable  Central nervous system: Alert, remains aphasic     Data Reviewed: I have personally reviewed following labs and imaging studies  CBC: Recent Labs  Lab 12/11/19 0242  WBC 6.5  HGB 11.1*  HCT 35.2*  MCV 84.4  PLT [id number]   Basic Metabolic Panel: Recent Labs  Lab 12/11/19 0242  NA 141  K 4.3  CL 102  CO2 28  GLUCOSE 81  BUN 37*  CREATININE 1.28*  CALCIUM 9.9   GFR: Estimated Creatinine Clearance: 64.5 mL/min (A) (by C-G formula based on SCr of 1.28 mg/dL (H)). Liver Function Tests: Recent Labs  Lab 12/11/19 0242  AST 17  ALT 37  ALKPHOS 65  BILITOT 0.4  PROT 6.6  ALBUMIN 3.0*   No results for input(s):  LIPASE, AMYLASE in the last 168 hours. No results for input(s): AMMONIA in the last 168 hours. Coagulation Profile: No results for input(s): INR, PROTIME in the last 168 hours. Cardiac Enzymes: No results for input(s): CKTOTAL, CKMB, CKMBINDEX, TROPONINI in the last 168 hours. BNP (last 3 results) No results for input(s): PROBNP in the last 8760 hours. HbA1C: No results for input(s): HGBA1C in the last 72 hours. CBG: Recent Labs  Lab 12/13/19 1202 12/13/19 1630 12/13/19 1934 12/14/19 0423 12/14/19 0908  GLUCAP 124* 105* 88 83 100*   Lipid Profile: No results for input(s): CHOL, HDL, LDLCALC, TRIG, CHOLHDL, LDLDIRECT in the last 72 hours. Thyroid Function Tests: No results for input(s): TSH, T4TOTAL, FREET4, T3FREE, THYROIDAB in the last 72 hours. Anemia Panel: No results for input(s): VITAMINB12, FOLATE, FERRITIN, TIBC, IRON, RETICCTPCT in the last 72 hours. Sepsis Labs: No results for input(s): PROCALCITON, LATICACIDVEN in the last 168 hours.  No results found for this or any previous visit (from the past 240 hour(s)).    Radiology Studies: No results found.  Scheduled Meds: . amLODipine  10 mg Per Tube Daily  . chlorhexidine  15 mL Mouth Rinse BID  . enoxaparin (LOVENOX) injection  40 mg Subcutaneous Daily  . feeding supplement (OSMOLITE 1.2 CAL)  474 mL Per Tube QID  . feeding supplement (PRO-STAT SUGAR FREE 64)  30 mL Per Tube BID  . folic acid  1 mg Per Tube Daily  . free water  300 mL Per Tube Q4H  . hydrALAZINE  25 mg Per Tube Q8H  . insulin aspart  0-15 Units Subcutaneous Q4H  . levETIRAcetam  500 mg Per Tube BID  . mouth rinse  15 mL Mouth Rinse q12n4p  . metoprolol tartrate  100 mg Per Tube BID  . multivitamin  15 mL Per Tube Daily  . pantoprazole sodium  40 mg Per Tube BID  . QUEtiapine  50 mg Per Tube QHS  . sodium chloride flush  10-40 mL Intracatheter Q12H  . thiamine  100 mg Per Tube Daily   Continuous Infusions:   LOS: 96 days      Time  spent: 10 minutes   Dessa Phi, DO [facility] 12/14/2019, 11:20 AM   Available via Epic secure chat 7am-7pm After these hours, please refer to coverage provider listed on [email or web address]

## 2019-12-15 DIAGNOSIS — I611 Nontraumatic intracerebral hemorrhage in hemisphere, cortical: Secondary | ICD-10-CM | POA: Diagnosis not present

## 2019-12-15 LAB — GLUCOSE, CAPILLARY
Glucose-Capillary: 107 mg/dL — ABNORMAL HIGH (ref 70–99)
Glucose-Capillary: 118 mg/dL — ABNORMAL HIGH (ref 70–99)
Glucose-Capillary: 159 mg/dL — ABNORMAL HIGH (ref 70–99)
Glucose-Capillary: 94 mg/dL (ref 70–99)
Glucose-Capillary: 95 mg/dL (ref 70–99)

## 2019-12-15 NOTE — Progress Notes (Signed)
PROGRESS NOTE    Joshua [name]  [id number] DOB: 18-Oct-1981 DOA: 09/09/2019 PCP: Patient, [name]     Brief Narrative:  Joshua [name] is a 38 year old male admitted by neurology on 9/12 with seizure, right hemiplegia, decreased responsiveness found to have left hemispheric intracranial hemorrhage, subsequently had a rapidly declining neurological status, intubated and admitted to ICU, underwent left craniotomy for decompression and evacuation of ICH on 9/12 by Dr. Vertell Limber. Continues to have slow neurological recovery, prolonged hospitalization treated for seizures and aspiration pneumonia.  Continues to be awake and alert, but aphasic. PEG tube placement on 9/28. Disposition continues to be challenging, uninsured, needs long-term placement.  New events last 24 hours / Subjective: Nothing new to report today. Remains aphasic. No distress.   Assessment & Plan:   Principal Problem:   ICH (intracerebral hemorrhage) (HCC) Active Problems:   Subdural hematoma (HCC)   Intracerebral hemorrhage (HCC)   Cytotoxic brain edema (HCC)   Hemorrhagic stroke (HCC)   Acute respiratory failure ([place])   Essential hypertension   Severe protein-calorie malnutrition ([place])   CKD (chronic kidney disease), stage IV (HCC)   Seizure (HCC)    Left intracranial hemorrhage  Patient underwent decompressive craniectomy and hematoma evacuation with implantation of skull flap in the right abdomen by Dr. Vertell Limber in 9/12. Felt to be secondary to uncontrolled, untreated hypertension, CTA was negative for aneurysm or vascular malformation. Remains aphasic/nonverbal with dense right hemiplegia, cognitive impairment, inconsistently follows commands. Previous hospitalist discussed with Dr. Vertell Limber 12/2 regarding flap replacement; given his bedridden status and lack of progress since surgery, no indication for replacement of his skull at this time as would only be a cosmetic fix -PT/OT following, recommending SNF -Social  work/case management following for placement  Seizure Secondary to above ICH, continue Keppra 500mg  BID  Aspiration pneumonia Dysphagia Sputum culture 09/11/2019 with normal oral flora.  Completed 7-day course of antibiotics during initial hospitalization. PEG tube placed for dysphagia.   -Continue tube feeds per nutrition -Trial p.o. intake for comfort  Klebsiella oxytoca UTI -Completed course of cefazolin  Essential hypertension -Stable, continue amlodipine, hydralazine, lopressor   Severe protein calorie malnutrition -Continue tube feeds  Stage 2 CKD:  Creatinine at baseline, around 1.5. Check BMP intermittently.    Mild anemia of chronic disease: Hemoglobin stable. Check CBC intermittently.    DVT prophylaxis: Lovenox Code Status: Full Family Communication: None at bedside Disposition Plan: Difficult placement patient is medically stable, is dependent on total care including PEG for adequate enteral nutrition.  Patient remains nonverbal and unable to make needs known.  Awaiting SNF placement      Antimicrobials:  Anti-infectives (From admission, onward)   Start     Dose/Rate Route Frequency Ordered Stop   09/27/19 1130  cephALEXin (KEFLEX) 250 MG/5ML suspension 500 mg     500 mg Per Tube Every 12 hours 09/27/19 1052 09/29/19 2250   09/25/19 1445  ceFAZolin (ANCEF) IVPB 2g/100 mL premix  Status:  Discontinued     2 g 200 mL/hr over 30 Minutes Intravenous Every 12 hours 09/25/19 1417 09/27/19 1052   09/14/19 1000  Ampicillin-Sulbactam (UNASYN) 3 g in sodium chloride 0.9 % 100 mL IVPB     3 g 200 mL/hr over 30 Minutes Intravenous Every 8 hours 09/14/19 0922 09/17/19 1816   09/13/19 1100  vancomycin (VANCOCIN) IVPB 750 mg/150 ml premix  Status:  Discontinued     750 mg 150 mL/hr over 60 Minutes Intravenous Every 24 hours 09/13/19 0728 09/13/19 [id number]  09/12/19 1100  vancomycin (VANCOCIN) IVPB 1000 mg/200 mL premix  Status:  Discontinued     1,000 mg 200 mL/hr  over 60 Minutes Intravenous Every 24 hours 09/11/19 1034 09/13/19 0728   09/11/19 1130  piperacillin-tazobactam (ZOSYN) IVPB 3.375 g  Status:  Discontinued     3.375 g 12.5 mL/hr over 240 Minutes Intravenous Every 8 hours 09/11/19 1054 09/14/19 0845   09/11/19 1100  vancomycin (VANCOCIN) 1,500 mg in sodium chloride 0.9 % 500 mL IVPB     1,500 mg 250 mL/hr over 120 Minutes Intravenous  Once 09/11/19 1034 09/11/19 1900   09/11/19 1015  piperacillin-tazobactam (ZOSYN) IVPB 4.5 g  Status:  Discontinued     4.5 g 200 mL/hr over 30 Minutes Intravenous Every 8 hours 09/11/19 1010 09/11/19 1052   09/09/19 0845  ceFAZolin (ANCEF) IVPB 2g/100 mL premix     2 g 200 mL/hr over 30 Minutes Intravenous Every 8 hours 09/09/19 0842 09/09/19 1658       Objective: Vitals:   12/14/19 2358 12/15/19 0423 12/15/19 0450 12/15/19 0813  BP: 117/77 100/72  112/81  Pulse: (!) 104 90  94  Resp: 16 12  17   Temp: 98.4 F (36.9 C) 98.9 F (37.2 C)  98.3 F (36.8 C)  TempSrc: Oral Axillary  Axillary  SpO2: 100% 98%  100%  Weight:   58.4 kg   Height:        Intake/Output Summary (Last 24 hours) at 12/15/2019 1124 Last data filed at 12/14/2019 1755 Gross per 24 hour  Intake 948 ml  Output --  Net 948 ml   Filed Weights   12/12/19 0402 12/13/19 0415 12/15/19 0450  Weight: 60.6 kg 58.9 kg 58.4 kg    Examination: General exam: Appears calm and comfortable  Central nervous system: Alert, remains aphasic     Data Reviewed: I have personally reviewed following labs and imaging studies  CBC: Recent Labs  Lab 12/11/19 0242  WBC 6.5  HGB 11.1*  HCT 35.2*  MCV 84.4  PLT Q000111Q   Basic Metabolic Panel: Recent Labs  Lab 12/11/19 0242  NA 141  K 4.3  CL 102  CO2 28  GLUCOSE 81  BUN 37*  CREATININE 1.28*  CALCIUM 9.9   GFR: Estimated Creatinine Clearance: 64 mL/min (A) (by C-G formula based on SCr of 1.28 mg/dL (H)). Liver Function Tests: Recent Labs  Lab 12/11/19 0242  AST 17  ALT 37   ALKPHOS 65  BILITOT 0.4  PROT 6.6  ALBUMIN 3.0*   No results for input(s): LIPASE, AMYLASE in the last 168 hours. No results for input(s): AMMONIA in the last 168 hours. Coagulation Profile: No results for input(s): INR, PROTIME in the last 168 hours. Cardiac Enzymes: No results for input(s): CKTOTAL, CKMB, CKMBINDEX, TROPONINI in the last 168 hours. BNP (last 3 results) No results for input(s): PROBNP in the last 8760 hours. HbA1C: No results for input(s): HGBA1C in the last 72 hours. CBG: Recent Labs  Lab 12/14/19 0908 12/14/19 2027 12/15/19 0025 12/15/19 0418 12/15/19 0815  GLUCAP 100* 106* 159* 95 94   Lipid Profile: No results for input(s): CHOL, HDL, LDLCALC, TRIG, CHOLHDL, LDLDIRECT in the last 72 hours. Thyroid Function Tests: No results for input(s): TSH, T4TOTAL, FREET4, T3FREE, THYROIDAB in the last 72 hours. Anemia Panel: No results for input(s): VITAMINB12, FOLATE, FERRITIN, TIBC, IRON, RETICCTPCT in the last 72 hours. Sepsis Labs: No results for input(s): PROCALCITON, LATICACIDVEN in the last 168 hours.  No results found for  this or any previous visit (from the past 240 hour(s)).    Radiology Studies: No results found.    Scheduled Meds: . amLODipine  10 mg Per Tube Daily  . chlorhexidine  15 mL Mouth Rinse BID  . enoxaparin (LOVENOX) injection  40 mg Subcutaneous Daily  . feeding supplement (OSMOLITE 1.2 CAL)  474 mL Per Tube QID  . feeding supplement (PRO-STAT SUGAR FREE 64)  30 mL Per Tube BID  . folic acid  1 mg Per Tube Daily  . free water  300 mL Per Tube Q4H  . hydrALAZINE  25 mg Per Tube Q8H  . insulin aspart  0-15 Units Subcutaneous Q4H  . levETIRAcetam  500 mg Per Tube BID  . mouth rinse  15 mL Mouth Rinse q12n4p  . metoprolol tartrate  100 mg Per Tube BID  . multivitamin  15 mL Per Tube Daily  . pantoprazole sodium  40 mg Per Tube BID  . QUEtiapine  50 mg Per Tube QHS  . sodium chloride flush  10-40 mL Intracatheter Q12H  .  thiamine  100 mg Per Tube Daily   Continuous Infusions:   LOS: 97 days      Time spent: 10 minutes   Dessa Phi, DO [facility] 12/15/2019, 11:24 AM   Available via Epic secure chat 7am-7pm After these hours, please refer to coverage provider listed on [email or web address]

## 2019-12-15 NOTE — Progress Notes (Signed)
SLP Cancellation Note  Patient Details Name: Joshua [name] MRN: [id number] DOB: 1981-07-19   Cancelled treatment:        Attempted to see pt for skilled Dysphagia therapy, however pt refused to accept PO trials after multiple attempts. Will see at next available scheduled time.    Wynelle Bourgeois., MA, CCC-SLP 12/15/2019, 10:54 AM

## 2019-12-16 DIAGNOSIS — I611 Nontraumatic intracerebral hemorrhage in hemisphere, cortical: Secondary | ICD-10-CM | POA: Diagnosis not present

## 2019-12-16 LAB — GLUCOSE, CAPILLARY
Glucose-Capillary: 102 mg/dL — ABNORMAL HIGH (ref 70–99)
Glucose-Capillary: 106 mg/dL — ABNORMAL HIGH (ref 70–99)
Glucose-Capillary: 132 mg/dL — ABNORMAL HIGH (ref 70–99)
Glucose-Capillary: 140 mg/dL — ABNORMAL HIGH (ref 70–99)
Glucose-Capillary: 148 mg/dL — ABNORMAL HIGH (ref 70–99)
Glucose-Capillary: 82 mg/dL (ref 70–99)

## 2019-12-16 NOTE — Progress Notes (Signed)
PROGRESS NOTE    Joshua [name]  [id number] DOB: Feb 05, 1981 DOA: 09/09/2019 PCP: Patient, [name]     Brief Narrative:  Joshua [name] is a 38 year old male admitted by neurology on 9/12 with seizure, right hemiplegia, decreased responsiveness found to have left hemispheric intracranial hemorrhage, subsequently had a rapidly declining neurological status, intubated and admitted to ICU, underwent left craniotomy for decompression and evacuation of ICH on 9/12 by Dr. Vertell Limber. Continues to have slow neurological recovery, prolonged hospitalization treated for seizures and aspiration pneumonia.  Continues to be awake and alert, but aphasic. PEG tube placement on 9/28. Disposition continues to be challenging, uninsured, needs long-term placement.  New events last 24 hours / Subjective: No new events overnight  Assessment & Plan:   Principal Problem:   ICH (intracerebral hemorrhage) (HCC) Active Problems:   Subdural hematoma (HCC)   Intracerebral hemorrhage (HCC)   Cytotoxic brain edema (HCC)   Hemorrhagic stroke (HCC)   Acute respiratory failure ([place])   Essential hypertension   Severe protein-calorie malnutrition (HCC)   CKD (chronic kidney disease), stage IV (HCC)   Seizure (HCC)    Left intracranial hemorrhage  Patient underwent decompressive craniectomy and hematoma evacuation with implantation of skull flap in the right abdomen by Dr. Vertell Limber in 9/12. Felt to be secondary to uncontrolled, untreated hypertension, CTA was negative for aneurysm or vascular malformation. Remains aphasic/nonverbal with dense right hemiplegia, cognitive impairment, inconsistently follows commands. Previous hospitalist discussed with Dr. Vertell Limber 12/2 regarding flap replacement; given his bedridden status and lack of progress since surgery, no indication for replacement of his skull at this time as would only be a cosmetic fix -PT/OT following, recommending SNF -Social work/case management following for  placement  Seizure Secondary to above ICH, continue Keppra 500mg  BID  Aspiration pneumonia Dysphagia Sputum culture 09/11/2019 with normal oral flora.  Completed 7-day course of antibiotics during initial hospitalization. PEG tube placed for dysphagia.   -Continue tube feeds per nutrition -Trial p.o. intake for comfort  Klebsiella oxytoca UTI -Completed course of cefazolin  Essential hypertension -Stable, continue amlodipine, hydralazine, lopressor   Severe protein calorie malnutrition -Continue tube feeds  Stage 2 CKD:  -Creatinine at baseline, around 1.5. Check BMP intermittently.    Mild anemia of chronic disease: -Hemoglobin stable. Check CBC intermittently   DVT prophylaxis: Lovenox Code Status: Full Family Communication: None at bedside Disposition Plan: Difficult placement patient is medically stable, is dependent on total care including PEG for adequate enteral nutrition.  Patient remains nonverbal and unable to make needs known.  Awaiting SNF placement      Antimicrobials:  Anti-infectives (From admission, onward)   Start     Dose/Rate Route Frequency Ordered Stop   09/27/19 1130  cephALEXin (KEFLEX) 250 MG/5ML suspension 500 mg     500 mg Per Tube Every 12 hours 09/27/19 1052 09/29/19 2250   09/25/19 1445  ceFAZolin (ANCEF) IVPB 2g/100 mL premix  Status:  Discontinued     2 g 200 mL/hr over 30 Minutes Intravenous Every 12 hours 09/25/19 1417 09/27/19 1052   09/14/19 1000  Ampicillin-Sulbactam (UNASYN) 3 g in sodium chloride 0.9 % 100 mL IVPB     3 g 200 mL/hr over 30 Minutes Intravenous Every 8 hours 09/14/19 0922 09/17/19 1816   09/13/19 1100  vancomycin (VANCOCIN) IVPB 750 mg/150 ml premix  Status:  Discontinued     750 mg 150 mL/hr over 60 Minutes Intravenous Every 24 hours 09/13/19 0728 09/13/19 0838   09/12/19 1100  vancomycin (VANCOCIN) IVPB  1000 mg/200 mL premix  Status:  Discontinued     1,000 mg 200 mL/hr over 60 Minutes Intravenous Every 24  hours 09/11/19 1034 09/13/19 0728   09/11/19 1130  piperacillin-tazobactam (ZOSYN) IVPB 3.375 g  Status:  Discontinued     3.375 g 12.5 mL/hr over 240 Minutes Intravenous Every 8 hours 09/11/19 1054 09/14/19 0845   09/11/19 1100  vancomycin (VANCOCIN) 1,500 mg in sodium chloride 0.9 % 500 mL IVPB     1,500 mg 250 mL/hr over 120 Minutes Intravenous  Once 09/11/19 1034 09/11/19 1900   09/11/19 1015  piperacillin-tazobactam (ZOSYN) IVPB 4.5 g  Status:  Discontinued     4.5 g 200 mL/hr over 30 Minutes Intravenous Every 8 hours 09/11/19 1010 09/11/19 1052   09/09/19 0845  ceFAZolin (ANCEF) IVPB 2g/100 mL premix     2 g 200 mL/hr over 30 Minutes Intravenous Every 8 hours 09/09/19 0842 09/09/19 1658       Objective: Vitals:   12/16/19 0000 12/16/19 0226 12/16/19 0334 12/16/19 0905  BP: 115/90  96/73 120/84  Pulse: (!) 102  86 95  Resp: 16  16 16   Temp: 99 F (37.2 C)  98.3 F (36.8 C) 98.6 F (37 C)  TempSrc: Oral  Oral Oral  SpO2: 100%  98% 100%  Weight:  58.8 kg    Height:       No intake or output data in the 24 hours ending 12/16/19 1006 Filed Weights   12/13/19 0415 12/15/19 0450 12/16/19 0226  Weight: 58.9 kg 58.4 kg 58.8 kg    Examination: General exam: Appears calm and comfortable  Central nervous system: Alert, aphasic   Data Reviewed: I have personally reviewed following labs and imaging studies  CBC: Recent Labs  Lab 12/11/19 0242  WBC 6.5  HGB 11.1*  HCT 35.2*  MCV 84.4  PLT Q000111Q   Basic Metabolic Panel: Recent Labs  Lab 12/11/19 0242  NA 141  K 4.3  CL 102  CO2 28  GLUCOSE 81  BUN 37*  CREATININE 1.28*  CALCIUM 9.9   GFR: Estimated Creatinine Clearance: 64.4 mL/min (A) (by C-G formula based on SCr of 1.28 mg/dL (H)). Liver Function Tests: Recent Labs  Lab 12/11/19 0242  AST 17  ALT 37  ALKPHOS 65  BILITOT 0.4  PROT 6.6  ALBUMIN 3.0*   No results for input(s): LIPASE, AMYLASE in the last 168 hours. No results for input(s): AMMONIA  in the last 168 hours. Coagulation Profile: No results for input(s): INR, PROTIME in the last 168 hours. Cardiac Enzymes: No results for input(s): CKTOTAL, CKMB, CKMBINDEX, TROPONINI in the last 168 hours. BNP (last 3 results) No results for input(s): PROBNP in the last 8760 hours. HbA1C: No results for input(s): HGBA1C in the last 72 hours. CBG: Recent Labs  Lab 12/15/19 1548 12/15/19 2032 12/16/19 0029 12/16/19 0411 12/16/19 0743  GLUCAP 107* 118* 148* 82 102*   Lipid Profile: No results for input(s): CHOL, HDL, LDLCALC, TRIG, CHOLHDL, LDLDIRECT in the last 72 hours. Thyroid Function Tests: No results for input(s): TSH, T4TOTAL, FREET4, T3FREE, THYROIDAB in the last 72 hours. Anemia Panel: No results for input(s): VITAMINB12, FOLATE, FERRITIN, TIBC, IRON, RETICCTPCT in the last 72 hours. Sepsis Labs: No results for input(s): PROCALCITON, LATICACIDVEN in the last 168 hours.  No results found for this or any previous visit (from the past 240 hour(s)).    Radiology Studies: No results found.    Scheduled Meds: . amLODipine  10 mg Per  Tube Daily  . chlorhexidine  15 mL Mouth Rinse BID  . enoxaparin (LOVENOX) injection  40 mg Subcutaneous Daily  . feeding supplement (OSMOLITE 1.2 CAL)  474 mL Per Tube QID  . feeding supplement (PRO-STAT SUGAR FREE 64)  30 mL Per Tube BID  . folic acid  1 mg Per Tube Daily  . free water  300 mL Per Tube Q4H  . hydrALAZINE  25 mg Per Tube Q8H  . insulin aspart  0-15 Units Subcutaneous Q4H  . levETIRAcetam  500 mg Per Tube BID  . mouth rinse  15 mL Mouth Rinse q12n4p  . metoprolol tartrate  100 mg Per Tube BID  . multivitamin  15 mL Per Tube Daily  . pantoprazole sodium  40 mg Per Tube BID  . QUEtiapine  50 mg Per Tube QHS  . sodium chloride flush  10-40 mL Intracatheter Q12H  . thiamine  100 mg Per Tube Daily   Continuous Infusions:   LOS: 98 days      Time spent: 10 minutes   Dessa Phi, DO [facility] 12/16/2019, 10:06 AM   Available via Epic secure chat 7am-7pm After these hours, please refer to coverage provider listed on [email or web address]

## 2019-12-17 DIAGNOSIS — I611 Nontraumatic intracerebral hemorrhage in hemisphere, cortical: Secondary | ICD-10-CM | POA: Diagnosis not present

## 2019-12-17 LAB — GLUCOSE, CAPILLARY
Glucose-Capillary: 101 mg/dL — ABNORMAL HIGH (ref 70–99)
Glucose-Capillary: 112 mg/dL — ABNORMAL HIGH (ref 70–99)
Glucose-Capillary: 98 mg/dL (ref 70–99)
Glucose-Capillary: 111 mg/dL — ABNORMAL HIGH (ref 70–99)
Glucose-Capillary: 103 mg/dL — ABNORMAL HIGH (ref 70–99)
Glucose-Capillary: 101 mg/dL — ABNORMAL HIGH (ref 70–99)

## 2019-12-17 NOTE — Progress Notes (Signed)
PROGRESS NOTE    Joshua [name]  [id number] DOB: [birth year] DOA: 09/09/2019 PCP: Patient, [name]     Brief Narrative:  Joshua [name] is a 38 year old male admitted by neurology on 9/12 with seizure, right hemiplegia, decreased responsiveness found to have left hemispheric intracranial hemorrhage, subsequently had a rapidly declining neurological status, intubated and admitted to ICU, underwent left craniotomy for decompression and evacuation of ICH on 9/12 by Dr. Vertell Limber. Continues to have slow neurological recovery, prolonged hospitalization treated for seizures and aspiration pneumonia.  Continues to be awake and alert, but aphasic. PEG tube placement on 9/28. Disposition continues to be challenging, uninsured, needs long-term placement.  New events last 24 hours / Subjective: No new events  Assessment & Plan:   Principal Problem:   ICH (intracerebral hemorrhage) (HCC) Active Problems:   Subdural hematoma (HCC)   Intracerebral hemorrhage (HCC)   Cytotoxic brain edema (HCC)   Hemorrhagic stroke (HCC)   Acute respiratory failure ([place])   Essential hypertension   Severe protein-calorie malnutrition (HCC)   CKD (chronic kidney disease), stage IV (HCC)   Seizure (HCC)    Left intracranial hemorrhage  Patient underwent decompressive craniectomy and hematoma evacuation with implantation of skull flap in the right abdomen by Dr. Vertell Limber in 9/12. Felt to be secondary to uncontrolled, untreated hypertension, CTA was negative for aneurysm or vascular malformation. Remains aphasic/nonverbal with dense right hemiplegia, cognitive impairment, inconsistently follows commands. Previous hospitalist discussed with Dr. Vertell Limber 12/2 regarding flap replacement; given his bedridden status and lack of progress since surgery, no indication for replacement of his skull at this time as would only be a cosmetic fix -PT/OT following, recommending SNF -Social work/case management following for placement   Seizure Secondary to above ICH, continue Keppra 500mg  BID  Aspiration pneumonia Dysphagia Sputum culture 09/11/2019 with normal oral flora.  Completed 7-day course of antibiotics during initial hospitalization. PEG tube placed for dysphagia.   -Continue tube feeds per nutrition -Trial p.o. intake for comfort  Klebsiella oxytoca UTI -Completed course of cefazolin  Essential hypertension -Stable, continue amlodipine, hydralazine, lopressor   Severe protein calorie malnutrition -Continue tube feeds  Stage 2 CKD:  -Creatinine at baseline, around 1.5. Check BMP intermittently.    Mild anemia of chronic disease: -Hemoglobin stable. Check CBC intermittently   DVT prophylaxis: Lovenox Code Status: Full Family Communication: None at bedside Disposition Plan: Difficult placement patient is medically stable, is dependent on total care including PEG for adequate enteral nutrition.  Patient remains nonverbal and unable to make needs known.  Awaiting SNF placement      Antimicrobials:  Anti-infectives (From admission, onward)   Start     Dose/Rate Route Frequency Ordered Stop   09/27/19 1130  cephALEXin (KEFLEX) 250 MG/5ML suspension 500 mg     500 mg Per Tube Every 12 hours 09/27/19 1052 09/29/19 2250   09/25/19 1445  ceFAZolin (ANCEF) IVPB 2g/100 mL premix  Status:  Discontinued     2 g 200 mL/hr over 30 Minutes Intravenous Every 12 hours 09/25/19 1417 09/27/19 1052   09/14/19 1000  Ampicillin-Sulbactam (UNASYN) 3 g in sodium chloride 0.9 % 100 mL IVPB     3 g 200 mL/hr over 30 Minutes Intravenous Every 8 hours 09/14/19 0922 09/17/19 1816   09/13/19 1100  vancomycin (VANCOCIN) IVPB 750 mg/150 ml premix  Status:  Discontinued     750 mg 150 mL/hr over 60 Minutes Intravenous Every 24 hours 09/13/19 0728 09/13/19 0838   09/12/19 1100  vancomycin (VANCOCIN) IVPB 1000  mg/200 mL premix  Status:  Discontinued     1,000 mg 200 mL/hr over 60 Minutes Intravenous Every 24 hours  09/11/19 1034 09/13/19 0728   09/11/19 1130  piperacillin-tazobactam (ZOSYN) IVPB 3.375 g  Status:  Discontinued     3.375 g 12.5 mL/hr over 240 Minutes Intravenous Every 8 hours 09/11/19 1054 09/14/19 0845   09/11/19 1100  vancomycin (VANCOCIN) 1,500 mg in sodium chloride 0.9 % 500 mL IVPB     1,500 mg 250 mL/hr over 120 Minutes Intravenous  Once 09/11/19 1034 09/11/19 1900   09/11/19 1015  piperacillin-tazobactam (ZOSYN) IVPB 4.5 g  Status:  Discontinued     4.5 g 200 mL/hr over 30 Minutes Intravenous Every 8 hours 09/11/19 1010 09/11/19 1052   09/09/19 0845  ceFAZolin (ANCEF) IVPB 2g/100 mL premix     2 g 200 mL/hr over 30 Minutes Intravenous Every 8 hours 09/09/19 0842 09/09/19 1658       Objective: Vitals:   12/17/19 0000 12/17/19 0347 12/17/19 0421 12/17/19 0741  BP: 99/74  98/77 127/90  Pulse: 85  83 88  Resp: 16  16 16   Temp: 98 F (36.7 C)  97.7 F (36.5 C) 98.1 F (36.7 C)  TempSrc: Axillary  Axillary Oral  SpO2: 99%  100% 100%  Weight:  58.2 kg    Height:       No intake or output data in the 24 hours ending 12/17/19 0954 Filed Weights   12/15/19 0450 12/16/19 0226 12/17/19 0347  Weight: 58.4 kg 58.8 kg 58.2 kg    Examination: General exam: Appears calm and comfortable  Central nervous system: Alert, aphasic  Data Reviewed: I have personally reviewed following labs and imaging studies  CBC: Recent Labs  Lab 12/11/19 0242  WBC 6.5  HGB 11.1*  HCT 35.2*  MCV 84.4  PLT Q000111Q   Basic Metabolic Panel: Recent Labs  Lab 12/11/19 0242  NA 141  K 4.3  CL 102  CO2 28  GLUCOSE 81  BUN 37*  CREATININE 1.28*  CALCIUM 9.9   GFR: Estimated Creatinine Clearance: 63.8 mL/min (A) (by C-G formula based on SCr of 1.28 mg/dL (H)). Liver Function Tests: Recent Labs  Lab 12/11/19 0242  AST 17  ALT 37  ALKPHOS 65  BILITOT 0.4  PROT 6.6  ALBUMIN 3.0*   No results for input(s): LIPASE, AMYLASE in the last 168 hours. No results for input(s): AMMONIA in  the last 168 hours. Coagulation Profile: No results for input(s): INR, PROTIME in the last 168 hours. Cardiac Enzymes: No results for input(s): CKTOTAL, CKMB, CKMBINDEX, TROPONINI in the last 168 hours. BNP (last 3 results) No results for input(s): PROBNP in the last 8760 hours. HbA1C: No results for input(s): HGBA1C in the last 72 hours. CBG: Recent Labs  Lab 12/16/19 1706 12/16/19 1914 12/16/19 2311 12/17/19 0305 12/17/19 0738  GLUCAP 106* 140* 101* 112* 98   Lipid Profile: No results for input(s): CHOL, HDL, LDLCALC, TRIG, CHOLHDL, LDLDIRECT in the last 72 hours. Thyroid Function Tests: No results for input(s): TSH, T4TOTAL, FREET4, T3FREE, THYROIDAB in the last 72 hours. Anemia Panel: No results for input(s): VITAMINB12, FOLATE, FERRITIN, TIBC, IRON, RETICCTPCT in the last 72 hours. Sepsis Labs: No results for input(s): PROCALCITON, LATICACIDVEN in the last 168 hours.  No results found for this or any previous visit (from the past 240 hour(s)).    Radiology Studies: No results found.    Scheduled Meds: . amLODipine  10 mg Per Tube Daily  .  chlorhexidine  15 mL Mouth Rinse BID  . enoxaparin (LOVENOX) injection  40 mg Subcutaneous Daily  . feeding supplement (OSMOLITE 1.2 CAL)  474 mL Per Tube QID  . feeding supplement (PRO-STAT SUGAR FREE 64)  30 mL Per Tube BID  . folic acid  1 mg Per Tube Daily  . free water  300 mL Per Tube Q4H  . hydrALAZINE  25 mg Per Tube Q8H  . insulin aspart  0-15 Units Subcutaneous Q4H  . levETIRAcetam  500 mg Per Tube BID  . mouth rinse  15 mL Mouth Rinse q12n4p  . metoprolol tartrate  100 mg Per Tube BID  . multivitamin  15 mL Per Tube Daily  . pantoprazole sodium  40 mg Per Tube BID  . QUEtiapine  50 mg Per Tube QHS  . sodium chloride flush  10-40 mL Intracatheter Q12H  . thiamine  100 mg Per Tube Daily   Continuous Infusions:   LOS: 99 days      Time spent: 10 minutes   Dessa Phi, DO [facility] 12/17/2019,  9:54 AM   Available via Epic secure chat 7am-7pm After these hours, please refer to coverage provider listed on [email or web address]

## 2019-12-18 DIAGNOSIS — I611 Nontraumatic intracerebral hemorrhage in hemisphere, cortical: Secondary | ICD-10-CM | POA: Diagnosis not present

## 2019-12-18 LAB — GLUCOSE, CAPILLARY
Glucose-Capillary: 125 mg/dL — ABNORMAL HIGH (ref 70–99)
Glucose-Capillary: 107 mg/dL — ABNORMAL HIGH (ref 70–99)
Glucose-Capillary: 178 mg/dL — ABNORMAL HIGH (ref 70–99)
Glucose-Capillary: 89 mg/dL (ref 70–99)
Glucose-Capillary: 119 mg/dL — ABNORMAL HIGH (ref 70–99)
Glucose-Capillary: 112 mg/dL — ABNORMAL HIGH (ref 70–99)

## 2019-12-18 NOTE — Progress Notes (Signed)
PROGRESS NOTE    Joshua [name]  [id number] DOB: 01-08-1981 DOA: 09/09/2019 PCP: Patient, [name]     Brief Narrative:  Joshua [name] is a 38 year old male admitted by neurology on 9/12 with seizure, right hemiplegia, decreased responsiveness found to have left hemispheric intracranial hemorrhage, subsequently had a rapidly declining neurological status, intubated and admitted to ICU, underwent left craniotomy for decompression and evacuation of ICH on 9/12 by Dr. Vertell Limber. Continues to have slow neurological recovery, prolonged hospitalization treated for seizures and aspiration pneumonia.  Continues to be awake and alert, but aphasic. PEG tube placement on 9/28. Disposition continues to be challenging, uninsured, needs long-term placement.  New events last 24 hours / Subjective: No new events, continues to be aphasic, not very interactive   Assessment & Plan:   Principal Problem:   ICH (intracerebral hemorrhage) (HCC) Active Problems:   Subdural hematoma (HCC)   Intracerebral hemorrhage (HCC)   Cytotoxic brain edema (HCC)   Hemorrhagic stroke (HCC)   Acute respiratory failure ([place])   Essential hypertension   Severe protein-calorie malnutrition ([place])   CKD (chronic kidney disease), stage IV (HCC)   Seizure (HCC)    Left intracranial hemorrhage  Patient underwent decompressive craniectomy and hematoma evacuation with implantation of skull flap in the right abdomen by Dr. Vertell Limber in 9/12. Felt to be secondary to uncontrolled, untreated hypertension, CTA was negative for aneurysm or vascular malformation. Remains aphasic/nonverbal with dense right hemiplegia, cognitive impairment, inconsistently follows commands. Previous hospitalist discussed with Dr. Vertell Limber 12/2 regarding flap replacement; given his bedridden status and lack of progress since surgery, no indication for replacement of his skull at this time as would only be a cosmetic fix -PT/OT following, recommending SNF -Social  work/case management following for placement  Seizure Secondary to above ICH, continue Keppra 500mg  BID  Aspiration pneumonia Dysphagia Sputum culture 09/11/2019 with normal oral flora.  Completed 7-day course of antibiotics during initial hospitalization. PEG tube placed for dysphagia.   -Continue tube feeds per nutrition -Trial p.o. intake for comfort  Klebsiella oxytoca UTI -Completed course of cefazolin  Essential hypertension -Stable, continue amlodipine, hydralazine, lopressor   Severe protein calorie malnutrition -Continue tube feeds  Stage 2 CKD:  -Creatinine at baseline, around 1.5. Check BMP intermittently.    Mild anemia of chronic disease: -Hemoglobin stable. Check CBC intermittently   DVT prophylaxis: Lovenox Code Status: Full Family Communication: None at bedside; updated mother over the phone today  Disposition Plan: Difficult placement patient is medically stable, is dependent on total care including PEG for adequate enteral nutrition.  Patient remains nonverbal and unable to make needs known.  Awaiting SNF placement      Antimicrobials:  Anti-infectives (From admission, onward)   Start     Dose/Rate Route Frequency Ordered Stop   09/27/19 1130  cephALEXin (KEFLEX) 250 MG/5ML suspension 500 mg     500 mg Per Tube Every 12 hours 09/27/19 1052 09/29/19 2250   09/25/19 1445  ceFAZolin (ANCEF) IVPB 2g/100 mL premix  Status:  Discontinued     2 g 200 mL/hr over 30 Minutes Intravenous Every 12 hours 09/25/19 1417 09/27/19 1052   09/14/19 1000  Ampicillin-Sulbactam (UNASYN) 3 g in sodium chloride 0.9 % 100 mL IVPB     3 g 200 mL/hr over 30 Minutes Intravenous Every 8 hours 09/14/19 0922 09/17/19 1816   09/13/19 1100  vancomycin (VANCOCIN) IVPB 750 mg/150 ml premix  Status:  Discontinued     750 mg 150 mL/hr over 60 Minutes Intravenous Every  24 hours 09/13/19 0728 09/13/19 0838   09/12/19 1100  vancomycin (VANCOCIN) IVPB 1000 mg/200 mL premix  Status:   Discontinued     1,000 mg 200 mL/hr over 60 Minutes Intravenous Every 24 hours 09/11/19 1034 09/13/19 0728   09/11/19 1130  piperacillin-tazobactam (ZOSYN) IVPB 3.375 g  Status:  Discontinued     3.375 g 12.5 mL/hr over 240 Minutes Intravenous Every 8 hours 09/11/19 1054 09/14/19 0845   09/11/19 1100  vancomycin (VANCOCIN) 1,500 mg in sodium chloride 0.9 % 500 mL IVPB     1,500 mg 250 mL/hr over 120 Minutes Intravenous  Once 09/11/19 1034 09/11/19 1900   09/11/19 1015  piperacillin-tazobactam (ZOSYN) IVPB 4.5 g  Status:  Discontinued     4.5 g 200 mL/hr over 30 Minutes Intravenous Every 8 hours 09/11/19 1010 09/11/19 1052   09/09/19 0845  ceFAZolin (ANCEF) IVPB 2g/100 mL premix     2 g 200 mL/hr over 30 Minutes Intravenous Every 8 hours 09/09/19 0842 09/09/19 1658       Objective: Vitals:   12/17/19 1950 12/18/19 0014 12/18/19 0402 12/18/19 0811  BP: 125/88 126/89 117/86 123/88  Pulse: 89 78 76 93  Resp: 18 18 18 20   Temp: 99.2 F (37.3 C) 98.9 F (37.2 C) 98 F (36.7 C) 98.1 F (36.7 C)  TempSrc: Oral Oral Oral Oral  SpO2: 99% 100% 100% 100%  Weight:      Height:        Intake/Output Summary (Last 24 hours) at 12/18/2019 1000 Last data filed at 12/18/2019 0552 Gross per 24 hour  Intake --  Output 1475 ml  Net -1475 ml   Filed Weights   12/15/19 0450 12/16/19 0226 12/17/19 0347  Weight: 58.4 kg 58.8 kg 58.2 kg    Examination: General exam: Appears calm and comfortable  Central nervous system: Alert   Data Reviewed: I have personally reviewed following labs and imaging studies  CBC: No results for input(s): WBC, NEUTROABS, HGB, HCT, MCV, PLT in the last 168 hours. Basic Metabolic Panel: No results for input(s): NA, K, CL, CO2, GLUCOSE, BUN, CREATININE, CALCIUM, MG, PHOS in the last 168 hours. GFR: Estimated Creatinine Clearance: 63.8 mL/min (A) (by C-G formula based on SCr of 1.28 mg/dL (H)). Liver Function Tests: No results for input(s): AST, ALT, ALKPHOS,  BILITOT, PROT, ALBUMIN in the last 168 hours. No results for input(s): LIPASE, AMYLASE in the last 168 hours. No results for input(s): AMMONIA in the last 168 hours. Coagulation Profile: No results for input(s): INR, PROTIME in the last 168 hours. Cardiac Enzymes: No results for input(s): CKTOTAL, CKMB, CKMBINDEX, TROPONINI in the last 168 hours. BNP (last 3 results) No results for input(s): PROBNP in the last 8760 hours. HbA1C: No results for input(s): HGBA1C in the last 72 hours. CBG: Recent Labs  Lab 12/17/19 1141 12/17/19 1543 12/17/19 1954 12/18/19 0400 12/18/19 0813  GLUCAP 111* 103* 101* 125* 107*   Lipid Profile: No results for input(s): CHOL, HDL, LDLCALC, TRIG, CHOLHDL, LDLDIRECT in the last 72 hours. Thyroid Function Tests: No results for input(s): TSH, T4TOTAL, FREET4, T3FREE, THYROIDAB in the last 72 hours. Anemia Panel: No results for input(s): VITAMINB12, FOLATE, FERRITIN, TIBC, IRON, RETICCTPCT in the last 72 hours. Sepsis Labs: No results for input(s): PROCALCITON, LATICACIDVEN in the last 168 hours.  No results found for this or any previous visit (from the past 240 hour(s)).    Radiology Studies: No results found.    Scheduled Meds: . amLODipine  10  mg Per Tube Daily  . chlorhexidine  15 mL Mouth Rinse BID  . enoxaparin (LOVENOX) injection  40 mg Subcutaneous Daily  . feeding supplement (OSMOLITE 1.2 CAL)  474 mL Per Tube QID  . feeding supplement (PRO-STAT SUGAR FREE 64)  30 mL Per Tube BID  . folic acid  1 mg Per Tube Daily  . free water  300 mL Per Tube Q4H  . hydrALAZINE  25 mg Per Tube Q8H  . insulin aspart  0-15 Units Subcutaneous Q4H  . levETIRAcetam  500 mg Per Tube BID  . mouth rinse  15 mL Mouth Rinse q12n4p  . metoprolol tartrate  100 mg Per Tube BID  . multivitamin  15 mL Per Tube Daily  . pantoprazole sodium  40 mg Per Tube BID  . QUEtiapine  50 mg Per Tube QHS  . sodium chloride flush  10-40 mL Intracatheter Q12H  . thiamine  100  mg Per Tube Daily   Continuous Infusions:   LOS: 100 days      Time spent: 10 minutes   Dessa Phi, DO [facility] 12/18/2019, 10:00 AM   Available via Epic secure chat 7am-7pm After these hours, please refer to coverage provider listed on [email or web address]

## 2019-12-18 NOTE — Progress Notes (Signed)
Occupational Therapy Treatment Patient Details Name: Joshua [name] MRN: [id number] DOB: 30-Dec-1980 Today's Date: 12/18/2019    History of present illness Pt is a 38 y.o. 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 S/p L frontoparietal craniectomy for evacuation of L basal ganglia hematoma 9/12. ETT 9/12-9/18, 9/18-9/23. PEG tube placed 9/28 secondary to dysphagia. PMH includes alcohol abuse, seizures, HTN.   OT comments  Pt received in bed, upon arrival pt making great eye contact with therapists. Pt resisted hand over hand assistance to wash face while support in bed. He demonstrated increased awareness of therapist, waving to her and looking for her as she walked behind the bed. Pt visually tracked therapist as she walked around the room with min vc. Pt required maxA+2 to progress to EOB. He required maxA+2 for powerup into standing and modA+2 for stability while standing and cues for posture. He tolerated standing for 10-30min 2x with 1 seated rest break, he initiated standing with the second attempt. Pt nodded head yes a couple times during the session. Pt will continue to benefit from skilled OT services to maximize safety and independence with ADL/IADL and functional mobility. Will continue to follow acutely and progress as tolerated.    Follow Up Recommendations  SNF;Supervision/Assistance - 24 hour    Equipment Recommendations  None recommended by OT    Recommendations for Other Services      Precautions / Restrictions Precautions Precautions: Fall Precaution Comments: L crani, PEG, helmet Required Braces or Orthoses: Splint/Cast Splint/Cast: resting hand splint and elbow splint for R UE due synergistic pattern Restrictions Weight Bearing Restrictions: No       Mobility Bed Mobility Overal bed mobility: Needs Assistance Bed Mobility: Supine to Sit;Sit to Supine     Supine to sit: +2 for physical assistance;HOB elevated;Max  assist Sit to supine: +2 for physical assistance;+2 for safety/equipment;Max assist   General bed mobility comments: assist for BLE management, max cues to let go of bed rail and hold therapist's hand;maxA to progress trunk to upright posture;maxA+2 to return to supien  Transfers Overall transfer level: Needs assistance Equipment used: 2 person hand held assist Transfers: Sit to/from Stand Sit to Stand: Mod assist;+2 physical assistance;+2 safety/equipment;Max assist         General transfer comment: maxA+2 to powerup into standing;modA+2 to maintain standing balance    Balance Overall balance assessment: Needs assistance Sitting-balance support: Feet supported;Single extremity supported Sitting balance-Leahy Scale: Poor Sitting balance - Comments: Pt able to achieve brief periods of min guard, but otherwise requiring up to minA-modA assist due to posterior and right lateral lean. Use of LUE on to reach forward to prompte anterior weight shift     Standing balance-Leahy Scale: Poor Standing balance comment: sit<>stand x2 from EOB with modA+2;pt able to tolerate standing for about 63min each time;pt initiate standing second time;max cues for upright posture, unable to achieve full upright posture due to knee flexion tone                            ADL either performed or assessed with clinical judgement   ADL Overall ADL's : Needs assistance/impaired Eating/Feeding: NPO   Grooming: Maximal assistance;Total assistance Grooming Details (indicate cue type and reason): pt grabbed wash cloth from therapist, resisted hand over hand assistance to wash face;did not initiate washing face;totalA for grooming             Lower Body  Dressing: +2 for physical assistance;+2 for safety/equipment;Sit to/from stand;Moderate assistance Lower Body Dressing Details (indicate cue type and reason): simulated             Functional mobility during ADLs: +2 for physical  assistance;Moderate assistance;+2 for safety/equipment General ADL Comments: pt tolerated sitting EOB for ~12min in preparation for ADL     Vision   Vision Assessment?: Vision impaired- to be further tested in functional context;Yes Additional Comments: pt with great eye contact throughout today's session;he was able to follow therapist walking in the room;shifted gaze from therapist on his right to therapist on his left   Perception     Praxis      Cognition Arousal/Alertness: Awake/alert Behavior During Therapy: Flat affect Overall Cognitive Status: Impaired/Different from baseline Area of Impairment: Attention;Following commands;Safety/judgement;Awareness;Problem solving                   Current Attention Level: Focused Memory: Decreased short-term memory Following Commands: Follows one step commands inconsistently Safety/Judgement: Decreased awareness of safety;Decreased awareness of deficits Awareness: Intellectual Problem Solving: Slow processing;Difficulty sequencing;Requires verbal cues;Decreased initiation;Requires tactile cues General Comments: Pt with increased eye contact throughout today's session;pt resistive to hand over hand assistance to wash face;pt following <25% of one step commands inconsistently, but more consistent with sitting upright on edge of bed;pt more interactive reaching to touch therapist's face and began smiling;pt initiated standing x1;nodded head yes several times during session; pt waved to therapist        Exercises     Shoulder Instructions       General Comments      Pertinent Vitals/ Pain       Pain Assessment: Faces Faces Pain Scale: Hurts little more Pain Location: RUE with movement Pain Descriptors / Indicators: Guarding Pain Intervention(s): Monitored during session;Limited activity within patient's tolerance  Home Living                                          Prior Functioning/Environment               Frequency  Min 2X/week        Progress Toward Goals  OT Goals(current goals can now be found in the care plan section)  Progress towards OT goals: Progressing toward goals  Acute Rehab OT Goals Patient Stated Goal: none stated OT Goal Formulation: Patient unable to participate in goal setting Time For Goal Achievement: 01/01/20 Potential to Achieve Goals: Good ADL Goals Pt Will Perform Grooming: sitting;with mod assist Pt Will Transfer to Toilet: with mod assist;stand pivot transfer Additional ADL Goal #1: Pt will maintain static sitting balance EOB with minguard to increase independence with ADL task. Additional ADL Goal #2: Pt will reach using RUE and LUE for items outside base of support while sitting with minA in prepartation for ADL completion. Additional ADL Goal #3: Pt will follow one step commands 25% of the time during functional task.   Plan Discharge plan remains appropriate    Co-evaluation                 AM-PAC OT "6 Clicks" Daily Activity     Outcome Measure   Help from another person eating meals?: Total Help from another person taking care of personal grooming?: A Lot Help from another person toileting, which includes using toliet, bedpan, or urinal?: A Lot Help from another person bathing (including washing,  rinsing, drying)?: Total Help from another person to put on and taking off regular upper body clothing?: Total Help from another person to put on and taking off regular lower body clothing?: Total 6 Click Score: 8    End of Session Equipment Utilized During Treatment: Gait belt  OT Visit Diagnosis: Other abnormalities of gait and mobility (R26.89);Hemiplegia and hemiparesis;Cognitive communication deficit (R41.841) Symptoms and signs involving cognitive functions: Other Nontraumatic ICH Hemiplegia - Right/Left: Right Hemiplegia - caused by: Other Nontraumatic intracranial hemorrhage   Activity Tolerance Patient tolerated treatment  well   Patient Left in bed;with call bell/phone within reach;with bed alarm set   Nurse Communication Mobility status(condom cath fell off)        Time: [id number] OT Time Calculation (min): 52 min  Charges: OT General Charges $OT Visit: 1 Visit OT Treatments $Self Care/Home Management : 23-37 mins $Therapeutic Exercise: 8-22 mins  Dorinda Hill OTR/L Acute Rehabilitation Services Office: [place] 12/18/2019, 1:30 PM

## 2019-12-18 NOTE — Progress Notes (Signed)
  Speech Language Pathology Treatment: Dysphagia;Cognitive-Linquistic  Patient Details Name: Joshua [name] MRN: [id number] DOB: [birth year] Today's Date: 12/18/2019 Time: [id number] SLP Time Calculation (min) (ACUTE ONLY): 16 min  Assessment / Plan / Recommendation Clinical Impression  Pt awake in bed, staring at TV.  Breakfast arrived to room and SLP set up tray for pt.  He did not attempt to reach for cup, fork, spoon with total cues sealing his lips tightly to prevent po intake offered.  In addition, Carmex provided due to dry lips - pt did not allow SLP to place and when he took the Carmex, he simply held it in his hand without attempts to use.   Pt is communicating nonverbally - indicating his lack of desire to participate in treatment. His birthday is tomorrow and he wrinkled up his nose when SLP sung him Happy Birthday x2, despite total cues, pt did not attempt to sing Happy Birthday.  Located sports on tv and pt did move eyebrows up when asked if desired to watch sports (after SLP passed sports station) likely indicating desire to remain on certain television station.  SLP questions if pt may have sadness due to his current situation.  Question if he would benefit from intervention for possible sadness??  Note congested cough x3 during session - but pt refusing po intake with this SLP>  RN reports he is taking in a littkle but has not observed him with intake and she states pt is frequently denying oral care from staff.  Will continue efforts once a week on trial basis given his decreased participation.     HPI HPI: Pt is a 38 year old male who presented with new seizure, and was found to have a large left BG intracranial hemorrhage s/p L frontal temporal craniectomy with evacuation 9/12. ETT 9/12-9/18; 9/18-9/23. Has been seen repeatedly by SLP, pts attention impacts ability to take PO, resists assistance, has been NPO since admit. Has recently shown interest in water, SLP reconsulted. PMH: HTN,  alcoholism, noncompliance.  Pt has been on a po diet but refuses to eat with this SLP.  RN reports pt has been resistant to tube feeding.      SLP Plan  Discharge SLP treatment due to (comment)(lack of participation)       Recommendations  Diet recommendations: Regular;Thin liquid Liquids provided via: Cup;Straw Medication Administration: Via alternative means Compensations: Minimize environmental distractions(pt to feed self) Postural Changes and/or Swallow Maneuvers: Seated upright 90 degrees;Upright 30-60 min after meal                Oral Care Recommendations: Oral care QID;Staff/trained caregiver to provide oral care Follow up Recommendations: Skilled Nursing facility SLP Visit Diagnosis: Dysphagia, unspecified (R13.10) Plan: Discharge SLP treatment due to (comment)(lack of participation)       GO                Macario Golds 12/18/2019, 9:12 AM Kathleen Lime, [place] Office [phone]

## 2019-12-18 NOTE — Progress Notes (Signed)
Patient refused all medications and tube feedings at this time. Patient would not let nurse get near PEG tube and kept pushing her hands away. MD Dessa Phi notified. Will continue with current plan or care. [place]

## 2019-12-19 DIAGNOSIS — I611 Nontraumatic intracerebral hemorrhage in hemisphere, cortical: Secondary | ICD-10-CM | POA: Diagnosis not present

## 2019-12-19 LAB — GLUCOSE, CAPILLARY
Glucose-Capillary: 147 mg/dL — ABNORMAL HIGH (ref 70–99)
Glucose-Capillary: 104 mg/dL — ABNORMAL HIGH (ref 70–99)
Glucose-Capillary: 97 mg/dL (ref 70–99)
Glucose-Capillary: 93 mg/dL (ref 70–99)
Glucose-Capillary: 129 mg/dL — ABNORMAL HIGH (ref 70–99)
Glucose-Capillary: 94 mg/dL (ref 70–99)
Glucose-Capillary: 113 mg/dL — ABNORMAL HIGH (ref 70–99)

## 2019-12-19 NOTE — Progress Notes (Signed)
Physical Therapy Treatment Patient Details Name: Joshua [name] MRN: [id number] DOB: [birth year] Today's Date: 12/19/2019    History of Present Illness Pt is a 38 y.o. 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 S/p L frontoparietal craniectomy for evacuation of L basal ganglia hematoma 9/12. ETT 9/12-9/18, 9/18-9/23. PEG tube placed 9/28 secondary to dysphagia. PMH includes alcohol abuse, seizures, HTN.    PT Comments    Pt progressing slowly, not as engaged in session today. R lateral lean strong with initial sitting but after practicing sit<>stand, was able to maintain midline better in sitting with min A. Worked on standing tolerance for pregait. Transferred squat pivot to chair with max A +2. Pt left in chair with posey chair alarm belt and feet down. PT will continue to follow.    Follow Up Recommendations  SNF;Supervision/Assistance - 24 hour     Equipment Recommendations  Wheelchair (measurements PT);Wheelchair cushion (measurements PT);Hospital bed;3in1 (PT)    Recommendations for Other Services       Precautions / Restrictions Precautions Precautions: Fall Precaution Comments: L crani, PEG, helmet Required Braces or Orthoses: Splint/Cast Splint/Cast: resting hand splint and elbow splint for R UE due synergistic pattern Restrictions Weight Bearing Restrictions: No    Mobility  Bed Mobility Overal bed mobility: Needs Assistance Bed Mobility: Supine to Sit     Supine to sit: Max assist     General bed mobility comments: max A for mgmt BLE's and elevation of trunk into sitting, needed max A to maintain sitting initially as well.   Transfers Overall transfer level: Needs assistance Equipment used: None;2 person hand held assist Transfers: Set designer Transfers;Sit to/from Stand Sit to Stand: +2 physical assistance;Max assist   Squat pivot transfers: Max assist     General transfer comment: max A +2 for sit<>stand,  mod A +2 to maintain standing, posterior lean. Max A +2 for low squat pivot to chair from bed  Ambulation/Gait             General Gait Details: unable at this time   Stairs             Wheelchair Mobility    Modified Rankin (Stroke Patients Only) Modified Rankin (Stroke Patients Only) Pre-Morbid Rankin Score: No symptoms Modified Rankin: Severe disability     Balance Overall balance assessment: Needs assistance Sitting-balance support: Feet supported;Single extremity supported Sitting balance-Leahy Scale: Poor Sitting balance - Comments: at first needed max A due to post and R lean but after practicing standing, was able to maintain midline better and needed only min A in sitting Postural control: Right lateral lean   Standing balance-Leahy Scale: Poor Standing balance comment: mod A +2 to maintain standing                            Cognition Arousal/Alertness: Awake/alert Behavior During Therapy: Flat affect Overall Cognitive Status: Impaired/Different from baseline Area of Impairment: Attention;Following commands;Safety/judgement;Awareness;Problem solving                   Current Attention Level: Focused Memory: Decreased short-term memory Following Commands: Follows one step commands inconsistently Safety/Judgement: Decreased awareness of safety;Decreased awareness of deficits Awareness: Intellectual Problem Solving: Slow processing;Difficulty sequencing;Requires verbal cues;Decreased initiation;Requires tactile cues General Comments: pt makes eye contect each time his name is called. Followed 1 step commands 25%      Exercises      General Comments General comments (  skin integrity, edema, etc.): sung happy birthday to him but despite trying to engage him he remained detached during session today      Pertinent Vitals/Pain Pain Assessment: Faces Pain Score: 0-No pain    Home Living                      Prior  Function            PT Goals (current goals can now be found in the care plan section) Acute Rehab PT Goals Patient Stated Goal: none stated PT Goal Formulation: Patient unable to participate in goal setting Time For Goal Achievement: 12/28/19 Potential to Achieve Goals: Fair Progress towards PT goals: Progressing toward goals    Frequency    Min 2X/week      PT Plan Current plan remains appropriate    Co-evaluation              AM-PAC PT "6 Clicks" Mobility   Outcome Measure  Help needed turning from your back to your side while in a flat bed without using bedrails?: Total Help needed moving from lying on your back to sitting on the side of a flat bed without using bedrails?: Total Help needed moving to and from a bed to a chair (including a wheelchair)?: Total Help needed standing up from a chair using your arms (e.g., wheelchair or bedside chair)?: Total Help needed to walk in hospital room?: Total Help needed climbing 3-5 steps with a railing? : Total 6 Click Score: 6    End of Session Equipment Utilized During Treatment: Gait belt Activity Tolerance: Patient tolerated treatment well Patient left: in chair;with chair alarm set(posey chair alarm) Nurse Communication: Mobility status PT Visit Diagnosis: Muscle weakness (generalized) (M62.81);Hemiplegia and hemiparesis;Other abnormalities of gait and mobility (R26.89) Hemiplegia - Right/Left: Right Hemiplegia - dominant/non-dominant: Dominant Hemiplegia - caused by: Nontraumatic intracerebral hemorrhage     Time: [id number] PT Time Calculation (min) (ACUTE ONLY): 23 min  Charges:  $Gait Training: 8-22 mins $Therapeutic Activity: 8-22 mins                     [place]  Pager [phone] Office [place] 12/19/2019, 1:49 PM

## 2019-12-19 NOTE — Progress Notes (Signed)
PROGRESS NOTE    Joshua [name]  [id number] DOB: 1981-04-02 DOA: 09/09/2019 PCP: Patient, [name]     Brief Narrative:  Joshua [name] is a 38 year old male admitted by neurology on 9/12 with seizure, right hemiplegia, decreased responsiveness found to have left hemispheric intracranial hemorrhage, subsequently had a rapidly declining neurological status, intubated and admitted to ICU, underwent left craniotomy for decompression and evacuation of ICH on 9/12 by Dr. Vertell Limber. Continues to have slow neurological recovery, prolonged hospitalization treated for seizures and aspiration pneumonia.  Continues to be awake and alert, but aphasic. PEG tube placement on 9/28. Disposition continues to be challenging, uninsured, needs long-term placement.  New events last 24 hours / Subjective: It's his birthday today. No acute events overnight. Refused medications yesterday, hopefully will allow med administration today.   Assessment & Plan:   Principal Problem:   ICH (intracerebral hemorrhage) (HCC) Active Problems:   Subdural hematoma (HCC)   Intracerebral hemorrhage (HCC)   Cytotoxic brain edema (HCC)   Hemorrhagic stroke (HCC)   Acute respiratory failure ([place])   Essential hypertension   Severe protein-calorie malnutrition ([place])   CKD (chronic kidney disease), stage IV (HCC)   Seizure (HCC)    Left intracranial hemorrhage  Patient underwent decompressive craniectomy and hematoma evacuation with implantation of skull flap in the right abdomen by Dr. Vertell Limber in 9/12. Felt to be secondary to uncontrolled, untreated hypertension, CTA was negative for aneurysm or vascular malformation. Remains aphasic/nonverbal with dense right hemiplegia, cognitive impairment, inconsistently follows commands. Previous hospitalist discussed with Dr. Vertell Limber 12/2 regarding flap replacement; given his bedridden status and lack of progress since surgery, no indication for replacement of his skull at this time as would  only be a cosmetic fix -PT/OT following, recommending SNF -Social work/case management following for placement  Seizure Secondary to above ICH, continue Keppra 500mg  BID  Aspiration pneumonia Dysphagia Sputum culture 09/11/2019 with normal oral flora.  Completed 7-day course of antibiotics during initial hospitalization. PEG tube placed for dysphagia.   -Continue tube feeds per nutrition -Trial p.o. intake for comfort  Klebsiella oxytoca UTI -Completed course of cefazolin  Essential hypertension -Stable, continue amlodipine, hydralazine, lopressor   Severe protein calorie malnutrition -Continue tube feeds  Stage 2 CKD:  -Creatinine at baseline, around 1.5. Check BMP intermittently.    Mild anemia of chronic disease: -Hemoglobin stable. Check CBC intermittently   DVT prophylaxis: Lovenox Code Status: Full Family Communication: None at bedside; updated mother over the phone today  Disposition Plan: Difficult placement patient is medically stable, is dependent on total care including PEG for adequate enteral nutrition.  Patient remains nonverbal and unable to make needs known.  Awaiting SNF placement      Antimicrobials:  Anti-infectives (From admission, onward)   Start     Dose/Rate Route Frequency Ordered Stop   09/27/19 1130  cephALEXin (KEFLEX) 250 MG/5ML suspension 500 mg     500 mg Per Tube Every 12 hours 09/27/19 1052 09/29/19 2250   09/25/19 1445  ceFAZolin (ANCEF) IVPB 2g/100 mL premix  Status:  Discontinued     2 g 200 mL/hr over 30 Minutes Intravenous Every 12 hours 09/25/19 1417 09/27/19 1052   09/14/19 1000  Ampicillin-Sulbactam (UNASYN) 3 g in sodium chloride 0.9 % 100 mL IVPB     3 g 200 mL/hr over 30 Minutes Intravenous Every 8 hours 09/14/19 0922 09/17/19 1816   09/13/19 1100  vancomycin (VANCOCIN) IVPB 750 mg/150 ml premix  Status:  Discontinued     750 mg  150 mL/hr over 60 Minutes Intravenous Every 24 hours 09/13/19 0728 09/13/19 0838   09/12/19  1100  vancomycin (VANCOCIN) IVPB 1000 mg/200 mL premix  Status:  Discontinued     1,000 mg 200 mL/hr over 60 Minutes Intravenous Every 24 hours 09/11/19 1034 09/13/19 0728   09/11/19 1130  piperacillin-tazobactam (ZOSYN) IVPB 3.375 g  Status:  Discontinued     3.375 g 12.5 mL/hr over 240 Minutes Intravenous Every 8 hours 09/11/19 1054 09/14/19 0845   09/11/19 1100  vancomycin (VANCOCIN) 1,500 mg in sodium chloride 0.9 % 500 mL IVPB     1,500 mg 250 mL/hr over 120 Minutes Intravenous  Once 09/11/19 1034 09/11/19 1900   09/11/19 1015  piperacillin-tazobactam (ZOSYN) IVPB 4.5 g  Status:  Discontinued     4.5 g 200 mL/hr over 30 Minutes Intravenous Every 8 hours 09/11/19 1010 09/11/19 1052   09/09/19 0845  ceFAZolin (ANCEF) IVPB 2g/100 mL premix     2 g 200 mL/hr over 30 Minutes Intravenous Every 8 hours 09/09/19 0842 09/09/19 1658       Objective: Vitals:   12/18/19 2101 12/19/19 0109 12/19/19 0433 12/19/19 0801  BP: (!) 141/99 (!) 129/93 105/78 (!) 122/94  Pulse: (!) 102 91 86 89  Resp: 16 17 16 20   Temp: 98.3 F (36.8 C) 98.8 F (37.1 C) 98.8 F (37.1 C) 99 F (37.2 C)  TempSrc: Axillary Axillary Oral Oral  SpO2: 99% 99% 100% 100%  Weight:      Height:        Intake/Output Summary (Last 24 hours) at 12/19/2019 0912 Last data filed at 12/18/2019 1000 Gross per 24 hour  Intake --  Output 500 ml  Net -500 ml   Filed Weights   12/15/19 0450 12/16/19 0226 12/17/19 0347  Weight: 58.4 kg 58.8 kg 58.2 kg    Examination: General exam: Appears calm and comfortable  Respiratory system: Clear to auscultation. Respiratory effort normal. Cardiovascular system: S1 & S2 heard, RRR. No pedal edema. Gastrointestinal system: Abdomen is nondistended, soft and nontender. Central nervous system: Alert    Data Reviewed: I have personally reviewed following labs and imaging studies  CBC: No results for input(s): WBC, NEUTROABS, HGB, HCT, MCV, PLT in the last 168 hours. Basic  Metabolic Panel: No results for input(s): NA, K, CL, CO2, GLUCOSE, BUN, CREATININE, CALCIUM, MG, PHOS in the last 168 hours. GFR: Estimated Creatinine Clearance: 63.2 mL/min (A) (by C-G formula based on SCr of 1.28 mg/dL (H)). Liver Function Tests: No results for input(s): AST, ALT, ALKPHOS, BILITOT, PROT, ALBUMIN in the last 168 hours. No results for input(s): LIPASE, AMYLASE in the last 168 hours. No results for input(s): AMMONIA in the last 168 hours. Coagulation Profile: No results for input(s): INR, PROTIME in the last 168 hours. Cardiac Enzymes: No results for input(s): CKTOTAL, CKMB, CKMBINDEX, TROPONINI in the last 168 hours. BNP (last 3 results) No results for input(s): PROBNP in the last 8760 hours. HbA1C: No results for input(s): HGBA1C in the last 72 hours. CBG: Recent Labs  Lab 12/18/19 1550 12/18/19 2119 12/19/19 0134 12/19/19 0437 12/19/19 0902  GLUCAP 119* 112* 147* 104* 93   Lipid Profile: No results for input(s): CHOL, HDL, LDLCALC, TRIG, CHOLHDL, LDLDIRECT in the last 72 hours. Thyroid Function Tests: No results for input(s): TSH, T4TOTAL, FREET4, T3FREE, THYROIDAB in the last 72 hours. Anemia Panel: No results for input(s): VITAMINB12, FOLATE, FERRITIN, TIBC, IRON, RETICCTPCT in the last 72 hours. Sepsis Labs: No results for input(s): PROCALCITON,  LATICACIDVEN in the last 168 hours.  No results found for this or any previous visit (from the past 240 hour(s)).    Radiology Studies: No results found.    Scheduled Meds: . amLODipine  10 mg Per Tube Daily  . chlorhexidine  15 mL Mouth Rinse BID  . enoxaparin (LOVENOX) injection  40 mg Subcutaneous Daily  . feeding supplement (OSMOLITE 1.2 CAL)  474 mL Per Tube QID  . feeding supplement (PRO-STAT SUGAR FREE 64)  30 mL Per Tube BID  . folic acid  1 mg Per Tube Daily  . free water  300 mL Per Tube Q4H  . hydrALAZINE  25 mg Per Tube Q8H  . insulin aspart  0-15 Units Subcutaneous Q4H  . levETIRAcetam   500 mg Per Tube BID  . mouth rinse  15 mL Mouth Rinse q12n4p  . metoprolol tartrate  100 mg Per Tube BID  . multivitamin  15 mL Per Tube Daily  . pantoprazole sodium  40 mg Per Tube BID  . QUEtiapine  50 mg Per Tube QHS  . sodium chloride flush  10-40 mL Intracatheter Q12H  . thiamine  100 mg Per Tube Daily   Continuous Infusions:   LOS: 101 days      Time spent: 10 minutes   Dessa Phi, DO [facility] 12/19/2019, 9:12 AM   Available via Epic secure chat 7am-7pm After these hours, please refer to coverage provider listed on [email or web address]

## 2019-12-19 NOTE — TOC Progression Note (Signed)
Transition of Care [facility]) - Progression Note    Patient Details  Name: Joshua [name] MRN: [id number] Date of Birth: Dec 09, 1981  Transition of Care [facility]) CM/SW [place], [place] Phone Number: 12/19/2019, 3:11 PM  Clinical Narrative:   CSW continuing to follow for SNF placement. Patient continues to have no bed offers at this time.    Expected Discharge Plan: Skilled Nursing Facility Barriers to Discharge: SNF Pending payor source - LOG, SNF Pending bed offer, Inadequate or no insurance  Expected Discharge Plan and Services Expected Discharge Plan: [place] In-house Referral: Clinical Social Work Discharge Planning Services: NA Post Acute Care Choice: [place] Living arrangements for the past 2 months: Apartment                 DME Arranged: N/A DME Agency: NA       HH Arranged: NA HH Agency: NA         Social Determinants of Health (SDOH) Interventions    Readmission Risk Interventions No flowsheet data found.

## 2019-12-20 DIAGNOSIS — I10 Essential (primary) hypertension: Secondary | ICD-10-CM | POA: Diagnosis not present

## 2019-12-20 DIAGNOSIS — I611 Nontraumatic intracerebral hemorrhage in hemisphere, cortical: Secondary | ICD-10-CM | POA: Diagnosis not present

## 2019-12-20 DIAGNOSIS — I619 Nontraumatic intracerebral hemorrhage, unspecified: Secondary | ICD-10-CM | POA: Diagnosis not present

## 2019-12-20 DIAGNOSIS — N184 Chronic kidney disease, stage 4 (severe): Secondary | ICD-10-CM | POA: Diagnosis not present

## 2019-12-20 LAB — GLUCOSE, CAPILLARY
Glucose-Capillary: 105 mg/dL — ABNORMAL HIGH (ref 70–99)
Glucose-Capillary: 109 mg/dL — ABNORMAL HIGH (ref 70–99)
Glucose-Capillary: 118 mg/dL — ABNORMAL HIGH (ref 70–99)
Glucose-Capillary: 126 mg/dL — ABNORMAL HIGH (ref 70–99)
Glucose-Capillary: 146 mg/dL — ABNORMAL HIGH (ref 70–99)
Glucose-Capillary: 95 mg/dL (ref 70–99)
Glucose-Capillary: 96 mg/dL (ref 70–99)

## 2019-12-20 NOTE — Progress Notes (Signed)
Nutrition Follow-up  DOCUMENTATION CODES:   Severe malnutrition in context of acute illness/injury  INTERVENTION:  ContinueOsmolite 1.2 formula via PEG at goal volume of 474 ml (2 cartons/ARCs) given QID.   Provide 30 ml Prostat BID per tube.   Free water flushes of 300 ml every 4 hours per tube. (MD to adjust as appropriate)  Tube feeding to provide 2475 kcal, 135 grams of protein, and 3355 ml free water.  Continue PO intake for comfort as appropriate/tolerated.  NUTRITION DIAGNOSIS:   Severe Malnutrition related to acute illness(intracerebral hemorrage) as evidenced by severe fat depletion, severe muscle depletion; ongoing  GOAL:   Patient will meet greater than or equal to 90% of their needs; met with TF  MONITOR:   TF tolerance, Skin, Weight trends, Labs, I & O's  REASON FOR ASSESSMENT:   Ventilator, Consult Enteral/tube feeding initiation and management  ASSESSMENT:   38 yo male admitted with large left intracranial hemorrhage; S/P craniotomy. PMH includes HTN, alcoholism, noncompliance.  9/12left frontal temporal craniectomy and evacuation hematoma.  9/23 Extubated.  10/1 PEG placed 11/23- Nutrition Focused Physical Exam performed, severe subcutaneous fat loss and severe muscle wasting identified.   Pt continues on tube feeding regimen and has been tolerating it well. SLP following and pt currently on trial of po intake for comfort though risk of aspiration may be present. RD to continue with current orders and monitor for tolerance. Labs and medications reviewed.   Diet Order:   Diet Order            Diet regular Room service appropriate? No; Fluid consistency: Thin  Diet effective now              EDUCATION NEEDS:   Not appropriate for education at this time  Skin:  Skin Assessment: Reviewed RN Assessment  Last BM:  12/21  Height:   Ht Readings from Last 1 Encounters:  09/09/19 '5\' 9"'  (1.753 m)    Weight:   Wt Readings from Last 1  Encounters:  12/17/19 58.2 kg    Ideal Body Weight:  72.7 kg  BMI:  Body mass index is 18.95 kg/m.  Estimated Nutritional Needs:   Kcal:  2100-2400  Protein:  105-125 grams  Fluid:  >/= 2 L/day    Corrin Parker, MS, RD, LDN Pager # [phone] After hours/ weekend pager # [phone]

## 2019-12-20 NOTE — Progress Notes (Signed)
PROGRESS NOTE    Joshua [name]  [id number] DOB: Feb 10, 1981 DOA: 09/09/2019 PCP: Patient, [name]   Brief Narrative: Joshua [name] is a 38 y.o. male admitted by neurology on 9/12 with seizure, right hemiplegia, decreased responsiveness found to have left hemispheric intracranial hemorrhage, subsequently had a rapidly declining neurological status, intubated and admitted to ICU, underwent left craniotomy for decompression and evacuation of ICH on 9/12 by Dr. Vertell Limber.Continues to have slow neurological recovery, prolonged hospitalization treated for seizures and aspiration pneumonia.Continues to be awake and alert, but aphasic. PEG tube placement on 9/28.Disposition continues to be challenging, uninsured, needs long-term placement.   Assessment & Plan:   Principal Problem:   ICH (intracerebral hemorrhage) (HCC) Active Problems:   Subdural hematoma (HCC)   Intracerebral hemorrhage (HCC)   Cytotoxic brain edema (HCC)   Hemorrhagic stroke (HCC)   Acute respiratory failure ([place])   Essential hypertension   Severe protein-calorie malnutrition (HCC)   CKD (chronic kidney disease), stage IV (HCC)   Seizure ([place])   Left intracranial hemorrhage Patient underwentdecompressive craniectomy and hematoma evacuation withimplantation of skull flap in the right abdomenby Dr. Vertell Limber in 9/12.Felt to be secondary to uncontrolled, untreated hypertension, CTA was negative for aneurysm or vascular malformation.Remains aphasic/nonverbal with dense right hemiplegia, cognitive impairment, inconsistently follows commands. Previous hospitalist discussed with Dr. Vertell Limber 12/2 regarding flap replacement; given his bedridden status and lack of progress since surgery, no indication for replacement of his skull at this time as would only be a cosmetic fix. Currently a difficult placement.  Seizures Secondary to above -Continue Keppra 500 mg BID  Aspiration pneumonia Dysphagia Completed antibiotics. PEG  tube placed.  Klebsiella oxytoca UTI Treated with Cefazolin  Essential hypertension Stable -Continue amlodipine    DVT prophylaxis: Lovenox Code Status:   Code Status: Full Code Family Communication: None Disposition Plan: Discharge pending safe discharge plan   Consultants:     Procedures:     Antimicrobials:      Subjective: Aphasia  Objective: Vitals:   12/19/19 2015 12/19/19 2333 12/20/19 0355 12/20/19 0825  BP: (!) 129/109 99/73 (!) 130/104 (!) 125/91  Pulse: (!) 103 96 88 94  Resp: 19 18 19  (!) 24  Temp: 99.2 F (37.3 C) 99.4 F (37.4 C) 98 F (36.7 C) 98.3 F (36.8 C)  TempSrc: Oral Oral Oral Axillary  SpO2: 100% (!) 79% 100% 100%  Weight:      Height:        Intake/Output Summary (Last 24 hours) at 12/20/2019 1054 Last data filed at 12/20/2019 0600 Gross per 24 hour  Intake 474 ml  Output 750 ml  Net -276 ml   Filed Weights   12/15/19 0450 12/16/19 0226 12/17/19 0347  Weight: 58.4 kg 58.8 kg 58.2 kg    Examination:  General exam: Appears calm and comfortable Respiratory system: Clear to auscultation. Respiratory effort normal. Cardiovascular system: S1 & S2 heard, RRR. No murmurs, rubs, gallops or clicks. Gastrointestinal system: Abdomen is nondistended, soft and nontender. No organomegaly or masses felt. Normal bowel sounds heard. Central nervous system: Alert and aphasic. Does not seem to follow simple commands Extremities: No edema. No calf tenderness Skin: No cyanosis. No rashes Psychiatry: Judgement and insight appear impaired. Flat affect.     Data Reviewed: I have personally reviewed following labs and imaging studies  CBC: No results for input(s): WBC, NEUTROABS, HGB, HCT, MCV, PLT in the last 168 hours. Basic Metabolic Panel: No results for input(s): NA, K, CL, CO2, GLUCOSE, BUN, CREATININE, CALCIUM, MG,  PHOS in the last 168 hours. GFR: Estimated Creatinine Clearance: 63.2 mL/min (A) (by C-G formula based on SCr of 1.28  mg/dL (H)). Liver Function Tests: No results for input(s): AST, ALT, ALKPHOS, BILITOT, PROT, ALBUMIN in the last 168 hours. No results for input(s): LIPASE, AMYLASE in the last 168 hours. No results for input(s): AMMONIA in the last 168 hours. Coagulation Profile: No results for input(s): INR, PROTIME in the last 168 hours. Cardiac Enzymes: No results for input(s): CKTOTAL, CKMB, CKMBINDEX, TROPONINI in the last 168 hours. BNP (last 3 results) No results for input(s): PROBNP in the last 8760 hours. HbA1C: No results for input(s): HGBA1C in the last 72 hours. CBG: Recent Labs  Lab 12/19/19 1554 12/19/19 2018 12/20/19 0037 12/20/19 0426 12/20/19 0821  GLUCAP 94 113* 126* 95 105*   Lipid Profile: No results for input(s): CHOL, HDL, LDLCALC, TRIG, CHOLHDL, LDLDIRECT in the last 72 hours. Thyroid Function Tests: No results for input(s): TSH, T4TOTAL, FREET4, T3FREE, THYROIDAB in the last 72 hours. Anemia Panel: No results for input(s): VITAMINB12, FOLATE, FERRITIN, TIBC, IRON, RETICCTPCT in the last 72 hours. Sepsis Labs: No results for input(s): PROCALCITON, LATICACIDVEN in the last 168 hours.  No results found for this or any previous visit (from the past 240 hour(s)).       Radiology Studies: No results found.      Scheduled Meds: . amLODipine  10 mg Per Tube Daily  . chlorhexidine  15 mL Mouth Rinse BID  . enoxaparin (LOVENOX) injection  40 mg Subcutaneous Daily  . feeding supplement (OSMOLITE 1.2 CAL)  474 mL Per Tube QID  . feeding supplement (PRO-STAT SUGAR FREE 64)  30 mL Per Tube BID  . folic acid  1 mg Per Tube Daily  . free water  300 mL Per Tube Q4H  . hydrALAZINE  25 mg Per Tube Q8H  . insulin aspart  0-15 Units Subcutaneous Q4H  . levETIRAcetam  500 mg Per Tube BID  . mouth rinse  15 mL Mouth Rinse q12n4p  . metoprolol tartrate  100 mg Per Tube BID  . multivitamin  15 mL Per Tube Daily  . pantoprazole sodium  40 mg Per Tube BID  . QUEtiapine  50 mg  Per Tube QHS  . sodium chloride flush  10-40 mL Intracatheter Q12H  . thiamine  100 mg Per Tube Daily   Continuous Infusions:   LOS: 102 days     Joshua Poche, MD [facility] 12/20/2019, 10:54 AM  If 7PM-7AM, please contact night-coverage [email or web address]

## 2019-12-21 DIAGNOSIS — I611 Nontraumatic intracerebral hemorrhage in hemisphere, cortical: Secondary | ICD-10-CM | POA: Diagnosis not present

## 2019-12-21 DIAGNOSIS — N184 Chronic kidney disease, stage 4 (severe): Secondary | ICD-10-CM | POA: Diagnosis not present

## 2019-12-21 DIAGNOSIS — I10 Essential (primary) hypertension: Secondary | ICD-10-CM | POA: Diagnosis not present

## 2019-12-21 DIAGNOSIS — I619 Nontraumatic intracerebral hemorrhage, unspecified: Secondary | ICD-10-CM | POA: Diagnosis not present

## 2019-12-21 LAB — GLUCOSE, CAPILLARY
Glucose-Capillary: 133 mg/dL — ABNORMAL HIGH (ref 70–99)
Glucose-Capillary: 52 mg/dL — ABNORMAL LOW (ref 70–99)
Glucose-Capillary: 75 mg/dL (ref 70–99)
Glucose-Capillary: 113 mg/dL — ABNORMAL HIGH (ref 70–99)
Glucose-Capillary: 127 mg/dL — ABNORMAL HIGH (ref 70–99)

## 2019-12-21 NOTE — Progress Notes (Signed)
Physical Therapy Treatment Patient Details Name: Joshua [name] MRN: [id number] DOB: 22-Jul-1981 Today's Date: 12/21/2019    History of Present Illness Pt is a 38 y.o. 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 S/p L frontoparietal craniectomy for evacuation of L basal ganglia hematoma 9/12. ETT 9/12-9/18, 9/18-9/23. PEG tube placed 9/28 secondary to dysphagia. PMH includes alcohol abuse, seizures, HTN.    PT Comments    Pt supine in bed soiled in bowel and bladder incontience.  Performed rolling to R and L side to clean patient.  Post pericare assisted patient to edge of bed to donn helmet and prepare for transfer OOB to recliner.  Pt required +2 max assistance to move from surface to surface,      Follow Up Recommendations  SNF;Supervision/Assistance - 24 hour     Equipment Recommendations  Wheelchair (measurements PT);Wheelchair cushion (measurements PT);Hospital bed;3in1 (PT)    Recommendations for Other Services       Precautions / Restrictions Precautions Precautions: Fall Precaution Comments: L crani, PEG, helmet Required Braces or Orthoses: Splint/Cast Splint/Cast: resting hand splint and elbow splint for R UE due synergistic pattern Restrictions Weight Bearing Restrictions: No    Mobility  Bed Mobility Overal bed mobility: Needs Assistance Bed Mobility: Supine to Sit;Sit to Supine Rolling: Max assist Sidelying to sit: Max assist       General bed mobility comments: max A for mgmt BLE's and elevation of trunk into sitting, needed max A to maintain sitting initially as well.   Transfers Overall transfer level: Needs assistance Equipment used: None;2 person hand held assist Transfers: Squat Pivot Transfers   Stand pivot transfers: From elevated surface;Max assist;+2 physical assistance       General transfer comment: Max +2 to pivot from bed to recliner.  Ambulation/Gait Ambulation/Gait assistance: (NT)                Stairs             Wheelchair Mobility    Modified Rankin (Stroke Patients Only)       Balance Overall balance assessment: Needs assistance Sitting-balance support: Feet supported;Single extremity supported Sitting balance-Leahy Scale: Poor   Postural control: Right lateral lean                                  Cognition Arousal/Alertness: Awake/alert Behavior During Therapy: Flat affect Overall Cognitive Status: Impaired/Different from baseline Area of Impairment: Attention;Following commands;Safety/judgement;Awareness;Problem solving                     Memory: Decreased short-term memory Following Commands: Follows one step commands inconsistently Safety/Judgement: Decreased awareness of safety;Decreased awareness of deficits   Problem Solving: Slow processing;Difficulty sequencing;Requires verbal cues;Decreased initiation;Requires tactile cues General Comments: pt makes eye contect each time his name is called. Followed 1 step commands 25%      Exercises      General Comments        Pertinent Vitals/Pain Pain Assessment: No/denies pain Faces Pain Scale: Hurts little more Pain Location: RUE with movement Pain Descriptors / Indicators: Guarding Pain Intervention(s): Monitored during session;Repositioned    Home Living                      Prior Function            PT Goals (current goals can now be found in the care  plan section) Acute Rehab PT Goals Patient Stated Goal: none stated Potential to Achieve Goals: Fair Progress towards PT goals: Progressing toward goals    Frequency    Min 2X/week      PT Plan Current plan remains appropriate    Co-evaluation              AM-PAC PT "6 Clicks" Mobility   Outcome Measure  Help needed turning from your back to your side while in a flat bed without using bedrails?: Total Help needed moving from lying on your back to sitting on the side of  a flat bed without using bedrails?: Total Help needed moving to and from a bed to a chair (including a wheelchair)?: Total Help needed standing up from a chair using your arms (e.g., wheelchair or bedside chair)?: Total Help needed to walk in hospital room?: Total Help needed climbing 3-5 steps with a railing? : Total 6 Click Score: 6    End of Session Equipment Utilized During Treatment: Gait belt Activity Tolerance: Patient tolerated treatment well Patient left: in chair;with chair alarm set(alarm belt) Nurse Communication: Mobility status PT Visit Diagnosis: Muscle weakness (generalized) (M62.81);Hemiplegia and hemiparesis;Other abnormalities of gait and mobility (R26.89) Hemiplegia - Right/Left: Right Hemiplegia - dominant/non-dominant: Dominant Hemiplegia - caused by: Nontraumatic intracerebral hemorrhage     Time: 1155-1222 PT Time Calculation (min) (ACUTE ONLY): 27 min  Charges:  $Therapeutic Activity: 23-37 mins                     [name] , PTA Acute Rehabilitation Services Pager [phone] Office [phone]     Zinnia Tindall [name] 12/21/2019, 2:18 PM

## 2019-12-21 NOTE — Progress Notes (Signed)
PROGRESS NOTE    Joshua [name]  [id number] DOB: 1981/12/13 DOA: 09/09/2019 PCP: Patient, [name]   Brief Narrative: Joshua [name] is a 38 y.o. male admitted by neurology on 9/12 with seizure, right hemiplegia, decreased responsiveness found to have left hemispheric intracranial hemorrhage, subsequently had a rapidly declining neurological status, intubated and admitted to ICU, underwent left craniotomy for decompression and evacuation of ICH on 9/12 by Dr. Vertell Limber.Continues to have slow neurological recovery, prolonged hospitalization treated for seizures and aspiration pneumonia.Continues to be awake and alert, but aphasic. PEG tube placement on 9/28.Disposition continues to be challenging, uninsured, needs long-term placement.   Assessment & Plan:   Principal Problem:   ICH (intracerebral hemorrhage) (HCC) Active Problems:   Subdural hematoma (HCC)   Intracerebral hemorrhage (HCC)   Cytotoxic brain edema (HCC)   Hemorrhagic stroke (HCC)   Acute respiratory failure ([place])   Essential hypertension   Severe protein-calorie malnutrition (HCC)   CKD (chronic kidney disease), stage IV (HCC)   Seizure ([place])   Left intracranial hemorrhage Patient underwentdecompressive craniectomy and hematoma evacuation withimplantation of skull flap in the right abdomenby Dr. Vertell Limber in 9/12.Felt to be secondary to uncontrolled, untreated hypertension, CTA was negative for aneurysm or vascular malformation.Remains aphasic/nonverbal with dense right hemiplegia, cognitive impairment, inconsistently follows commands. Previous hospitalist discussed with Dr. Vertell Limber 12/2 regarding flap replacement; given his bedridden status and lack of progress since surgery, no indication for replacement of his skull at this time as would only be a cosmetic fix. Currently a difficult placement.  Seizures Secondary to above -Continue Keppra 500 mg BID  Aspiration pneumonia Dysphagia Completed antibiotics. PEG  tube placed.  Klebsiella oxytoca UTI Treated with Cefazolin  Essential hypertension Stable -Continue amlodipine    DVT prophylaxis: Lovenox Code Status:   Code Status: Full Code Family Communication: None Disposition Plan: Discharge pending safe discharge plan   Consultants:   Neurosurgery  Neurology  GI  Procedures:   9/19: Transthoracic Echocardiogram IMPRESSIONS    1. Left ventricular ejection fraction, by visual estimation, is 70 to 75%. The left ventricle has hyperdynamic function. Normal left ventricular size. There is severely increased left ventricular hypertrophy.  2. Global right ventricle has normal systolic function.The right ventricular size is normal. No increase in right ventricular wall thickness.  3. Left atrial size was normal.  4. Right atrial size was moderately dilated.  5. The mitral valve is grossly normal. Mild mitral valve regurgitation.  6. The tricuspid valve is grossly normal. Tricuspid valve regurgitation is trivial.  7. The aortic valve is tricuspid Aortic valve regurgitation was not visualized by color flow Doppler.  8. The pulmonic valve was grossly normal. Pulmonic valve regurgitation is not visualized by color flow Doppler.  9. The inferior vena cava was not able to be adequately assessed to estimate CVP.   10/1: EGD Findings:      No gross lesions were noted in the esophagus.      No gross lesions were noted in the stomach. Placement of an externally       removable PEG with no T-fasteners was successfully completed. The       external bumper was at the 3.0 cm marking on the tube. Estimated blood       loss was minimal.      No gross lesions were noted in the second portion of the duodenum. Impression:               - No gross lesions in esophagus.                           -  No gross lesions in the stomach.                           - No gross lesions in the second portion of the                            duodenum.                            - An externally removable PEG placement was                            successfully completed.                           - No specimens collected. Recommendation:           may use for Meds now, tube feeds in 4h   Antimicrobials:      Subjective: Aphasic  Objective: Vitals:   12/21/19 0350 12/21/19 0500 12/21/19 0744 12/21/19 1256  BP: 111/83  (!) 123/98 116/86  Pulse: 95  99 92  Resp: 20     Temp: 98.4 F (36.9 C)  98.4 F (36.9 C) 98.3 F (36.8 C)  TempSrc: Oral  Oral Oral  SpO2: 100%  99% 100%  Weight:  58.5 kg    Height:        Intake/Output Summary (Last 24 hours) at 12/21/2019 1355 Last data filed at 12/21/2019 0914 Gross per 24 hour  Intake 10 ml  Output 2200 ml  Net -2190 ml   Filed Weights   12/16/19 0226 12/17/19 0347 12/21/19 0500  Weight: 58.8 kg 58.2 kg 58.5 kg    Examination:  General exam: Appears calm and comfortable Respiratory system: Clear to auscultation. Respiratory effort normal. Cardiovascular system: S1 & S2 heard, RRR. No murmurs, rubs, gallops or clicks. Gastrointestinal system: Abdomen is nondistended, soft and nontender. No organomegaly or masses felt. Normal bowel sounds heard. Central nervous system: Alert. Aphasia. Squeezes my hand when asked. Extremities: No edema. No calf tenderness Skin: No cyanosis. No rashes    Data Reviewed: I have personally reviewed following labs and imaging studies  CBC: No results for input(s): WBC, NEUTROABS, HGB, HCT, MCV, PLT in the last 168 hours. Basic Metabolic Panel: No results for input(s): NA, K, CL, CO2, GLUCOSE, BUN, CREATININE, CALCIUM, MG, PHOS in the last 168 hours. GFR: Estimated Creatinine Clearance: 63.5 mL/min (A) (by C-G formula based on SCr of 1.28 mg/dL (H)). Liver Function Tests: No results for input(s): AST, ALT, ALKPHOS, BILITOT, PROT, ALBUMIN in the last 168 hours. No results for input(s): LIPASE, AMYLASE in the last 168 hours. No results for input(s):  AMMONIA in the last 168 hours. Coagulation Profile: No results for input(s): INR, PROTIME in the last 168 hours. Cardiac Enzymes: No results for input(s): CKTOTAL, CKMB, CKMBINDEX, TROPONINI in the last 168 hours. BNP (last 3 results) No results for input(s): PROBNP in the last 8760 hours. HbA1C: No results for input(s): HGBA1C in the last 72 hours. CBG: Recent Labs  Lab 12/20/19 2032 12/20/19 2315 12/21/19 0920 12/21/19 1237 12/21/19 1255  GLUCAP 118* 96 133* 52* 75   Lipid Profile: No results for input(s): CHOL, HDL, LDLCALC, TRIG, CHOLHDL, LDLDIRECT in the last 72 hours. Thyroid Function Tests: No results for input(s): TSH,  T4TOTAL, FREET4, T3FREE, THYROIDAB in the last 72 hours. Anemia Panel: No results for input(s): VITAMINB12, FOLATE, FERRITIN, TIBC, IRON, RETICCTPCT in the last 72 hours. Sepsis Labs: No results for input(s): PROCALCITON, LATICACIDVEN in the last 168 hours.  No results found for this or any previous visit (from the past 240 hour(s)).       Radiology Studies: No results found.      Scheduled Meds: . amLODipine  10 mg Per Tube Daily  . chlorhexidine  15 mL Mouth Rinse BID  . enoxaparin (LOVENOX) injection  40 mg Subcutaneous Daily  . feeding supplement (OSMOLITE 1.2 CAL)  474 mL Per Tube QID  . feeding supplement (PRO-STAT SUGAR FREE 64)  30 mL Per Tube BID  . folic acid  1 mg Per Tube Daily  . free water  300 mL Per Tube Q4H  . hydrALAZINE  25 mg Per Tube Q8H  . insulin aspart  0-15 Units Subcutaneous Q4H  . levETIRAcetam  500 mg Per Tube BID  . mouth rinse  15 mL Mouth Rinse q12n4p  . metoprolol tartrate  100 mg Per Tube BID  . multivitamin  15 mL Per Tube Daily  . pantoprazole sodium  40 mg Per Tube BID  . QUEtiapine  50 mg Per Tube QHS  . sodium chloride flush  10-40 mL Intracatheter Q12H  . thiamine  100 mg Per Tube Daily   Continuous Infusions:   LOS: 103 days     Cordelia Poche, MD [facility] 12/21/2019, 1:55  PM  If 7PM-7AM, please contact night-coverage [email or web address]

## 2019-12-22 DIAGNOSIS — I619 Nontraumatic intracerebral hemorrhage, unspecified: Secondary | ICD-10-CM | POA: Diagnosis not present

## 2019-12-22 DIAGNOSIS — N184 Chronic kidney disease, stage 4 (severe): Secondary | ICD-10-CM | POA: Diagnosis not present

## 2019-12-22 DIAGNOSIS — I10 Essential (primary) hypertension: Secondary | ICD-10-CM | POA: Diagnosis not present

## 2019-12-22 DIAGNOSIS — I611 Nontraumatic intracerebral hemorrhage in hemisphere, cortical: Secondary | ICD-10-CM | POA: Diagnosis not present

## 2019-12-22 LAB — GLUCOSE, CAPILLARY
Glucose-Capillary: 153 mg/dL — ABNORMAL HIGH (ref 70–99)
Glucose-Capillary: 106 mg/dL — ABNORMAL HIGH (ref 70–99)
Glucose-Capillary: 104 mg/dL — ABNORMAL HIGH (ref 70–99)
Glucose-Capillary: 96 mg/dL (ref 70–99)
Glucose-Capillary: 86 mg/dL (ref 70–99)
Glucose-Capillary: 87 mg/dL (ref 70–99)

## 2019-12-22 NOTE — Progress Notes (Signed)
Patient refusing medications and meals at this time. Pushes my hand away when I go near. Patient educated on importance of medications and nutrition. Dr. Lonny Prude notified.

## 2019-12-22 NOTE — Progress Notes (Signed)
PROGRESS NOTE    Joshua [name]  [id number] DOB: 07-29-1981 DOA: 09/09/2019 PCP: Patient, [name]   Brief Narrative: Joshua [name] is a 38 y.o. male admitted by neurology on 9/12 with seizure, right hemiplegia, decreased responsiveness found to have left hemispheric intracranial hemorrhage, subsequently had a rapidly declining neurological status, intubated and admitted to ICU, underwent left craniotomy for decompression and evacuation of ICH on 9/12 by Dr. Vertell Limber.Continues to have slow neurological recovery, prolonged hospitalization treated for seizures and aspiration pneumonia.Continues to be awake and alert, but aphasic. PEG tube placement on 9/28.Disposition continues to be challenging, uninsured, needs long-term placement.   Assessment & Plan:   Principal Problem:   ICH (intracerebral hemorrhage) (HCC) Active Problems:   Subdural hematoma (HCC)   Intracerebral hemorrhage (HCC)   Cytotoxic brain edema (HCC)   Hemorrhagic stroke (HCC)   Acute respiratory failure ([place])   Essential hypertension   Severe protein-calorie malnutrition (HCC)   CKD (chronic kidney disease), stage IV (HCC)   Seizure ([place])   Left intracranial hemorrhage Patient underwentdecompressive craniectomy and hematoma evacuation withimplantation of skull flap in the right abdomenby Dr. Vertell Limber in 9/12.Felt to be secondary to uncontrolled, untreated hypertension, CTA was negative for aneurysm or vascular malformation.Remains aphasic/nonverbal with dense right hemiplegia, cognitive impairment, inconsistently follows commands. Previous hospitalist discussed with Dr. Vertell Limber 12/2 regarding flap replacement; given his bedridden status and lack of progress since surgery, no indication for replacement of his skull at this time as would only be a cosmetic fix. Currently a difficult placement.  Seizures Secondary to above -Continue Keppra 500 mg BID  Aspiration pneumonia Dysphagia Completed antibiotics. PEG  tube placed.  Klebsiella oxytoca UTI Treated with Cefazolin  Essential hypertension Stable -Continue amlodipine    DVT prophylaxis: Lovenox Code Status:   Code Status: Full Code Family Communication: None Disposition Plan: Discharge pending safe discharge plan   Consultants:   Neurosurgery  Neurology  GI  Procedures:   9/19: Transthoracic Echocardiogram IMPRESSIONS    1. Left ventricular ejection fraction, by visual estimation, is 70 to 75%. The left ventricle has hyperdynamic function. Normal left ventricular size. There is severely increased left ventricular hypertrophy.  2. Global right ventricle has normal systolic function.The right ventricular size is normal. No increase in right ventricular wall thickness.  3. Left atrial size was normal.  4. Right atrial size was moderately dilated.  5. The mitral valve is grossly normal. Mild mitral valve regurgitation.  6. The tricuspid valve is grossly normal. Tricuspid valve regurgitation is trivial.  7. The aortic valve is tricuspid Aortic valve regurgitation was not visualized by color flow Doppler.  8. The pulmonic valve was grossly normal. Pulmonic valve regurgitation is not visualized by color flow Doppler.  9. The inferior vena cava was not able to be adequately assessed to estimate CVP.   10/1: EGD Findings:      No gross lesions were noted in the esophagus.      No gross lesions were noted in the stomach. Placement of an externally       removable PEG with no T-fasteners was successfully completed. The       external bumper was at the 3.0 cm marking on the tube. Estimated blood       loss was minimal.      No gross lesions were noted in the second portion of the duodenum. Impression:               - No gross lesions in esophagus.                           -  No gross lesions in the stomach.                           - No gross lesions in the second portion of the                            duodenum.                            - An externally removable PEG placement was                            successfully completed.                           - No specimens collected. Recommendation:           may use for Meds now, tube feeds in 4h   Antimicrobials:      Subjective: Aphasic  Objective: Vitals:   12/21/19 2319 12/22/19 0044 12/22/19 0303 12/22/19 1212  BP: (!) 125/97  (!) 128/97 (!) 147/100  Pulse: (!) 110  95 (!) 107  Resp: 18  16 17   Temp: 99.3 F (37.4 C)  98.1 F (36.7 C) 97.7 F (36.5 C)  TempSrc: Oral  Oral Oral  SpO2: 99%  100% 100%  Weight:  59 kg    Height:        Intake/Output Summary (Last 24 hours) at 12/22/2019 1425 Last data filed at 12/22/2019 0555 Gross per 24 hour  Intake -  Output 1850 ml  Net -1850 ml   Filed Weights   12/17/19 0347 12/21/19 0500 12/22/19 0044  Weight: 58.2 kg 58.5 kg 59 kg    Examination:  General exam: Appears calm and comfortable Respiratory system: Clear to auscultation. Respiratory effort normal. Cardiovascular system: S1 & S2 heard, RRR. No murmurs, rubs, gallops or clicks. Gastrointestinal system: Abdomen is nondistended, soft and nontender. No organomegaly or masses felt. Normal bowel sounds heard. Central nervous system: Alert. aphasic. Extremities: No edema. No calf tenderness Skin: No cyanosis. No rashes    Data Reviewed: I have personally reviewed following labs and imaging studies  CBC: No results for input(s): WBC, NEUTROABS, HGB, HCT, MCV, PLT in the last 168 hours. Basic Metabolic Panel: No results for input(s): NA, K, CL, CO2, GLUCOSE, BUN, CREATININE, CALCIUM, MG, PHOS in the last 168 hours. GFR: Estimated Creatinine Clearance: 64 mL/min (A) (by C-G formula based on SCr of 1.28 mg/dL (H)). Liver Function Tests: No results for input(s): AST, ALT, ALKPHOS, BILITOT, PROT, ALBUMIN in the last 168 hours. No results for input(s): LIPASE, AMYLASE in the last 168 hours. No results for input(s): AMMONIA in the  last 168 hours. Coagulation Profile: No results for input(s): INR, PROTIME in the last 168 hours. Cardiac Enzymes: No results for input(s): CKTOTAL, CKMB, CKMBINDEX, TROPONINI in the last 168 hours. BNP (last 3 results) No results for input(s): PROBNP in the last 8760 hours. HbA1C: No results for input(s): HGBA1C in the last 72 hours. CBG: Recent Labs  Lab 12/21/19 1953 12/21/19 2321 12/22/19 0345 12/22/19 0737 12/22/19 1208  GLUCAP 127* 153* 106* 104* 96   Lipid Profile: No results for input(s): CHOL, HDL, LDLCALC, TRIG, CHOLHDL, LDLDIRECT in the last 72 hours. Thyroid Function Tests: No results for input(s): TSH, T4TOTAL, FREET4,  T3FREE, THYROIDAB in the last 72 hours. Anemia Panel: No results for input(s): VITAMINB12, FOLATE, FERRITIN, TIBC, IRON, RETICCTPCT in the last 72 hours. Sepsis Labs: No results for input(s): PROCALCITON, LATICACIDVEN in the last 168 hours.  No results found for this or any previous visit (from the past 240 hour(s)).       Radiology Studies: No results found.      Scheduled Meds: . amLODipine  10 mg Per Tube Daily  . chlorhexidine  15 mL Mouth Rinse BID  . enoxaparin (LOVENOX) injection  40 mg Subcutaneous Daily  . feeding supplement (OSMOLITE 1.2 CAL)  474 mL Per Tube QID  . feeding supplement (PRO-STAT SUGAR FREE 64)  30 mL Per Tube BID  . folic acid  1 mg Per Tube Daily  . free water  300 mL Per Tube Q4H  . hydrALAZINE  25 mg Per Tube Q8H  . insulin aspart  0-15 Units Subcutaneous Q4H  . levETIRAcetam  500 mg Per Tube BID  . mouth rinse  15 mL Mouth Rinse q12n4p  . metoprolol tartrate  100 mg Per Tube BID  . multivitamin  15 mL Per Tube Daily  . pantoprazole sodium  40 mg Per Tube BID  . QUEtiapine  50 mg Per Tube QHS  . sodium chloride flush  10-40 mL Intracatheter Q12H  . thiamine  100 mg Per Tube Daily   Continuous Infusions:   LOS: 104 days     Cordelia Poche, MD [facility] 12/22/2019, 2:25 PM  If 7PM-7AM,  please contact night-coverage [email or web address]

## 2019-12-22 NOTE — Progress Notes (Signed)
Patient refusing to have vital signs taken, pushes nurse away. Dr. Lonny Prude notified.

## 2019-12-23 DIAGNOSIS — I611 Nontraumatic intracerebral hemorrhage in hemisphere, cortical: Secondary | ICD-10-CM | POA: Diagnosis not present

## 2019-12-23 DIAGNOSIS — I619 Nontraumatic intracerebral hemorrhage, unspecified: Secondary | ICD-10-CM | POA: Diagnosis not present

## 2019-12-23 DIAGNOSIS — I10 Essential (primary) hypertension: Secondary | ICD-10-CM | POA: Diagnosis not present

## 2019-12-23 DIAGNOSIS — N184 Chronic kidney disease, stage 4 (severe): Secondary | ICD-10-CM | POA: Diagnosis not present

## 2019-12-23 LAB — GLUCOSE, CAPILLARY
Glucose-Capillary: 134 mg/dL — ABNORMAL HIGH (ref 70–99)
Glucose-Capillary: 160 mg/dL — ABNORMAL HIGH (ref 70–99)
Glucose-Capillary: 172 mg/dL — ABNORMAL HIGH (ref 70–99)
Glucose-Capillary: 89 mg/dL (ref 70–99)
Glucose-Capillary: 93 mg/dL (ref 70–99)
Glucose-Capillary: 97 mg/dL (ref 70–99)
Glucose-Capillary: 98 mg/dL (ref 70–99)

## 2019-12-23 NOTE — Progress Notes (Signed)
PROGRESS NOTE    Joshua [name]  [id number] DOB: [birth year] DOA: 09/09/2019 PCP: Patient, [name]   Brief Narrative: Joshua [name] is a 38 y.o. male admitted by neurology on 9/12 with seizure, right hemiplegia, decreased responsiveness found to have left hemispheric intracranial hemorrhage, subsequently had a rapidly declining neurological status, intubated and admitted to ICU, underwent left craniotomy for decompression and evacuation of ICH on 9/12 by Dr. Vertell Limber.Continues to have slow neurological recovery, prolonged hospitalization treated for seizures and aspiration pneumonia.Continues to be awake and alert, but aphasic. PEG tube placement on 9/28.Disposition continues to be challenging, uninsured, needs long-term placement.   Assessment & Plan:   Principal Problem:   ICH (intracerebral hemorrhage) (HCC) Active Problems:   Subdural hematoma (HCC)   Intracerebral hemorrhage (HCC)   Cytotoxic brain edema (HCC)   Hemorrhagic stroke (HCC)   Acute respiratory failure ([place])   Essential hypertension   Severe protein-calorie malnutrition (HCC)   CKD (chronic kidney disease), stage IV (HCC)   Seizure ([place])   Left intracranial hemorrhage Patient underwentdecompressive craniectomy and hematoma evacuation withimplantation of skull flap in the right abdomenby Dr. Vertell Limber in 9/12.Felt to be secondary to uncontrolled, untreated hypertension, CTA was negative for aneurysm or vascular malformation.Remains aphasic/nonverbal with dense right hemiplegia, cognitive impairment, inconsistently follows commands. Previous hospitalist discussed with Dr. Vertell Limber 12/2 regarding flap replacement; given his bedridden status and lack of progress since surgery, no indication for replacement of his skull at this time as would only be a cosmetic fix. Currently a difficult placement.  Seizures Secondary to above -Continue Keppra 500 mg BID  Aspiration pneumonia Dysphagia Completed antibiotics. PEG  tube placed.  Klebsiella oxytoca UTI Treated with Cefazolin  Essential hypertension Stable -Continue amlodipine    DVT prophylaxis: Lovenox Code Status:   Code Status: Full Code Family Communication: None Disposition Plan: Discharge pending safe discharge plan   Consultants:   Neurosurgery  Neurology  GI  Procedures:   9/19: Transthoracic Echocardiogram IMPRESSIONS    1. Left ventricular ejection fraction, by visual estimation, is 70 to 75%. The left ventricle has hyperdynamic function. Normal left ventricular size. There is severely increased left ventricular hypertrophy.  2. Global right ventricle has normal systolic function.The right ventricular size is normal. No increase in right ventricular wall thickness.  3. Left atrial size was normal.  4. Right atrial size was moderately dilated.  5. The mitral valve is grossly normal. Mild mitral valve regurgitation.  6. The tricuspid valve is grossly normal. Tricuspid valve regurgitation is trivial.  7. The aortic valve is tricuspid Aortic valve regurgitation was not visualized by color flow Doppler.  8. The pulmonic valve was grossly normal. Pulmonic valve regurgitation is not visualized by color flow Doppler.  9. The inferior vena cava was not able to be adequately assessed to estimate CVP.   10/1: EGD Findings:      No gross lesions were noted in the esophagus.      No gross lesions were noted in the stomach. Placement of an externally       removable PEG with no T-fasteners was successfully completed. The       external bumper was at the 3.0 cm marking on the tube. Estimated blood       loss was minimal.      No gross lesions were noted in the second portion of the duodenum. Impression:               - No gross lesions in esophagus.                           -  No gross lesions in the stomach.                           - No gross lesions in the second portion of the                            duodenum.                            - An externally removable PEG placement was                            successfully completed.                           - No specimens collected. Recommendation:           may use for Meds now, tube feeds in 4h   Antimicrobials:      Subjective: Aphasic  Objective: Vitals:   12/23/19 0000 12/23/19 0434 12/23/19 0841 12/23/19 1125  BP: 102/82 104/83 117/81 118/83  Pulse: 93 93 94 (!) 102  Resp: 16 16 18 18   Temp: 98.9 F (37.2 C) 99.3 F (37.4 C) 98.5 F (36.9 C) 97.9 F (36.6 C)  TempSrc: Axillary Oral Oral Oral  SpO2: 100% 100% 100% 99%  Weight:      Height:       No intake or output data in the 24 hours ending 12/23/19 1323 Filed Weights   12/17/19 0347 12/21/19 0500 12/22/19 0044  Weight: 58.2 kg 58.5 kg 59 kg    Examination:  General exam: Appears calm and comfortable Respiratory system: Clear to auscultation. Respiratory effort normal. Cardiovascular system: S1 & S2 heard, RRR. No murmurs, rubs, gallops or clicks. Gastrointestinal system: Abdomen is nondistended, soft and nontender. No organomegaly or masses felt. Normal bowel sounds heard. Central nervous system: Alert Extremities: No edema. No calf tenderness Skin: No cyanosis. No rashes    Data Reviewed: I have personally reviewed following labs and imaging studies  CBC: No results for input(s): WBC, NEUTROABS, HGB, HCT, MCV, PLT in the last 168 hours. Basic Metabolic Panel: No results for input(s): NA, K, CL, CO2, GLUCOSE, BUN, CREATININE, CALCIUM, MG, PHOS in the last 168 hours. GFR: Estimated Creatinine Clearance: 64 mL/min (A) (by C-G formula based on SCr of 1.28 mg/dL (H)). Liver Function Tests: No results for input(s): AST, ALT, ALKPHOS, BILITOT, PROT, ALBUMIN in the last 168 hours. No results for input(s): LIPASE, AMYLASE in the last 168 hours. No results for input(s): AMMONIA in the last 168 hours. Coagulation Profile: No results for input(s): INR, PROTIME in the last 168  hours. Cardiac Enzymes: No results for input(s): CKTOTAL, CKMB, CKMBINDEX, TROPONINI in the last 168 hours. BNP (last 3 results) No results for input(s): PROBNP in the last 8760 hours. HbA1C: No results for input(s): HGBA1C in the last 72 hours. CBG: Recent Labs  Lab 12/22/19 2031 12/23/19 0008 12/23/19 0422 12/23/19 0838 12/23/19 1124  GLUCAP 87 160* 93 97 172*   Lipid Profile: No results for input(s): CHOL, HDL, LDLCALC, TRIG, CHOLHDL, LDLDIRECT in the last 72 hours. Thyroid Function Tests: No results for input(s): TSH, T4TOTAL, FREET4, T3FREE, THYROIDAB in the last 72 hours. Anemia Panel: No results for input(s): VITAMINB12, FOLATE, FERRITIN, TIBC, IRON, RETICCTPCT in the last 72  hours. Sepsis Labs: No results for input(s): PROCALCITON, LATICACIDVEN in the last 168 hours.  No results found for this or any previous visit (from the past 240 hour(s)).       Radiology Studies: No results found.      Scheduled Meds: . amLODipine  10 mg Per Tube Daily  . chlorhexidine  15 mL Mouth Rinse BID  . enoxaparin (LOVENOX) injection  40 mg Subcutaneous Daily  . feeding supplement (OSMOLITE 1.2 CAL)  474 mL Per Tube QID  . feeding supplement (PRO-STAT SUGAR FREE 64)  30 mL Per Tube BID  . folic acid  1 mg Per Tube Daily  . free water  300 mL Per Tube Q4H  . hydrALAZINE  25 mg Per Tube Q8H  . insulin aspart  0-15 Units Subcutaneous Q4H  . levETIRAcetam  500 mg Per Tube BID  . mouth rinse  15 mL Mouth Rinse q12n4p  . metoprolol tartrate  100 mg Per Tube BID  . multivitamin  15 mL Per Tube Daily  . pantoprazole sodium  40 mg Per Tube BID  . QUEtiapine  50 mg Per Tube QHS  . sodium chloride flush  10-40 mL Intracatheter Q12H  . thiamine  100 mg Per Tube Daily   Continuous Infusions:   LOS: 105 days     Cordelia Poche, MD [facility] 12/23/2019, 1:23 PM  If 7PM-7AM, please contact night-coverage [email or web address]

## 2019-12-24 DIAGNOSIS — I10 Essential (primary) hypertension: Secondary | ICD-10-CM | POA: Diagnosis not present

## 2019-12-24 DIAGNOSIS — N184 Chronic kidney disease, stage 4 (severe): Secondary | ICD-10-CM | POA: Diagnosis not present

## 2019-12-24 DIAGNOSIS — I611 Nontraumatic intracerebral hemorrhage in hemisphere, cortical: Secondary | ICD-10-CM | POA: Diagnosis not present

## 2019-12-24 LAB — GLUCOSE, CAPILLARY
Glucose-Capillary: 142 mg/dL — ABNORMAL HIGH (ref 70–99)
Glucose-Capillary: 100 mg/dL — ABNORMAL HIGH (ref 70–99)
Glucose-Capillary: 159 mg/dL — ABNORMAL HIGH (ref 70–99)
Glucose-Capillary: 99 mg/dL (ref 70–99)
Glucose-Capillary: 129 mg/dL — ABNORMAL HIGH (ref 70–99)
Glucose-Capillary: 93 mg/dL (ref 70–99)

## 2019-12-24 NOTE — Progress Notes (Signed)
Pt refused all oral nutrition offered by RN: breakfast, lunch, and water, juice, and cola. Will continue to offer oral nutrition.

## 2019-12-24 NOTE — Progress Notes (Signed)
PROGRESS NOTE    Joshua [name]  [id number] DOB: 1981/07/09 DOA: 09/09/2019 PCP: Patient, [name]   Brief Narrative: Joshua [name] is a 38 y.o. male admitted by neurology on 9/12 with seizure, right hemiplegia, decreased responsiveness found to have left hemispheric intracranial hemorrhage, subsequently had a rapidly declining neurological status, intubated and admitted to ICU, underwent left craniotomy for decompression and evacuation of ICH on 9/12 by Dr. Vertell Limber.Continues to have slow neurological recovery, prolonged hospitalization treated for seizures and aspiration pneumonia.Continues to be awake and alert, but aphasic. PEG tube placement on 9/28.Disposition continues to be challenging, uninsured, needs long-term placement.   Assessment & Plan:   Principal Problem:   ICH (intracerebral hemorrhage) (HCC) Active Problems:   Subdural hematoma (HCC)   Intracerebral hemorrhage (HCC)   Cytotoxic brain edema (HCC)   Hemorrhagic stroke (HCC)   Acute respiratory failure ([place])   Essential hypertension   Severe protein-calorie malnutrition (HCC)   CKD (chronic kidney disease), stage IV (HCC)   Seizure ([place])   Left intracranial hemorrhage Patient underwentdecompressive craniectomy and hematoma evacuation withimplantation of skull flap in the right abdomenby Dr. Vertell Limber in 9/12.Felt to be secondary to uncontrolled, untreated hypertension, CTA was negative for aneurysm or vascular malformation.Remains aphasic/nonverbal with dense right hemiplegia, cognitive impairment, inconsistently follows commands. Previous hospitalist discussed with Dr. Vertell Limber 12/2 regarding flap replacement; given his bedridden status and lack of progress since surgery, no indication for replacement of his skull at this time as would only be a cosmetic fix. Currently a difficult placement.  Seizures Secondary to above -Continue Keppra 500 mg BID  Aspiration pneumonia Dysphagia Completed antibiotics. PEG  tube placed.  Klebsiella oxytoca UTI Treated with Cefazolin  Essential hypertension Stable -Continue amlodipine    DVT prophylaxis: Lovenox Code Status:   Code Status: Full Code Family Communication: None Disposition Plan: Discharge pending safe discharge plan   Consultants:   Neurosurgery  Neurology  GI  Procedures:   9/19: Transthoracic Echocardiogram IMPRESSIONS    1. Left ventricular ejection fraction, by visual estimation, is 70 to 75%. The left ventricle has hyperdynamic function. Normal left ventricular size. There is severely increased left ventricular hypertrophy.  2. Global right ventricle has normal systolic function.The right ventricular size is normal. No increase in right ventricular wall thickness.  3. Left atrial size was normal.  4. Right atrial size was moderately dilated.  5. The mitral valve is grossly normal. Mild mitral valve regurgitation.  6. The tricuspid valve is grossly normal. Tricuspid valve regurgitation is trivial.  7. The aortic valve is tricuspid Aortic valve regurgitation was not visualized by color flow Doppler.  8. The pulmonic valve was grossly normal. Pulmonic valve regurgitation is not visualized by color flow Doppler.  9. The inferior vena cava was not able to be adequately assessed to estimate CVP.   10/1: EGD Findings:      No gross lesions were noted in the esophagus.      No gross lesions were noted in the stomach. Placement of an externally       removable PEG with no T-fasteners was successfully completed. The       external bumper was at the 3.0 cm marking on the tube. Estimated blood       loss was minimal.      No gross lesions were noted in the second portion of the duodenum. Impression:               - No gross lesions in esophagus.                           -  No gross lesions in the stomach.                           - No gross lesions in the second portion of the                            duodenum.                            - An externally removable PEG placement was                            successfully completed.                           - No specimens collected. Recommendation:           may use for Meds now, tube feeds in 4h   Antimicrobials:      Subjective: Aphasic  Objective: Vitals:   12/24/19 0500 12/24/19 0659 12/24/19 0809 12/24/19 1208  BP:  120/70 116/77 119/90  Pulse:   (!) 102 100  Resp:   18 17  Temp:   98.2 F (36.8 C) 98.2 F (36.8 C)  TempSrc:   Oral Oral  SpO2:   99% 99%  Weight: 60.7 kg     Height:        Intake/Output Summary (Last 24 hours) at 12/24/2019 1402 Last data filed at 12/24/2019 1329 Gross per 24 hour  Intake 0 ml  Output 475 ml  Net -475 ml   Filed Weights   12/21/19 0500 12/22/19 0044 12/24/19 0500  Weight: 58.5 kg 59 kg 60.7 kg    Examination:  General exam: Appears calm and comfortable      Data Reviewed: I have personally reviewed following labs and imaging studies  CBC: No results for input(s): WBC, NEUTROABS, HGB, HCT, MCV, PLT in the last 168 hours. Basic Metabolic Panel: No results for input(s): NA, K, CL, CO2, GLUCOSE, BUN, CREATININE, CALCIUM, MG, PHOS in the last 168 hours. GFR: Estimated Creatinine Clearance: 65.9 mL/min (A) (by C-G formula based on SCr of 1.28 mg/dL (H)). Liver Function Tests: No results for input(s): AST, ALT, ALKPHOS, BILITOT, PROT, ALBUMIN in the last 168 hours. No results for input(s): LIPASE, AMYLASE in the last 168 hours. No results for input(s): AMMONIA in the last 168 hours. Coagulation Profile: No results for input(s): INR, PROTIME in the last 168 hours. Cardiac Enzymes: No results for input(s): CKTOTAL, CKMB, CKMBINDEX, TROPONINI in the last 168 hours. BNP (last 3 results) No results for input(s): PROBNP in the last 8760 hours. HbA1C: No results for input(s): HGBA1C in the last 72 hours. CBG: Recent Labs  Lab 12/23/19 2043 12/23/19 2357 12/24/19 0351 12/24/19 0811  12/24/19 1210  GLUCAP 89 98 142* 100* 159*   Lipid Profile: No results for input(s): CHOL, HDL, LDLCALC, TRIG, CHOLHDL, LDLDIRECT in the last 72 hours. Thyroid Function Tests: No results for input(s): TSH, T4TOTAL, FREET4, T3FREE, THYROIDAB in the last 72 hours. Anemia Panel: No results for input(s): VITAMINB12, FOLATE, FERRITIN, TIBC, IRON, RETICCTPCT in the last 72 hours. Sepsis Labs: No results for input(s): PROCALCITON, LATICACIDVEN in the last 168 hours.  No results found for this or any previous visit (from the past 240 hour(s)).  Radiology Studies: No results found.      Scheduled Meds: . amLODipine  10 mg Per Tube Daily  . chlorhexidine  15 mL Mouth Rinse BID  . enoxaparin (LOVENOX) injection  40 mg Subcutaneous Daily  . feeding supplement (OSMOLITE 1.2 CAL)  474 mL Per Tube QID  . feeding supplement (PRO-STAT SUGAR FREE 64)  30 mL Per Tube BID  . folic acid  1 mg Per Tube Daily  . free water  300 mL Per Tube Q4H  . hydrALAZINE  25 mg Per Tube Q8H  . insulin aspart  0-15 Units Subcutaneous Q4H  . levETIRAcetam  500 mg Per Tube BID  . mouth rinse  15 mL Mouth Rinse q12n4p  . metoprolol tartrate  100 mg Per Tube BID  . multivitamin  15 mL Per Tube Daily  . pantoprazole sodium  40 mg Per Tube BID  . QUEtiapine  50 mg Per Tube QHS  . sodium chloride flush  10-40 mL Intracatheter Q12H  . thiamine  100 mg Per Tube Daily   Continuous Infusions:   LOS: 106 days     Cordelia Poche, MD [facility] 12/24/2019, 2:02 PM  If 7PM-7AM, please contact night-coverage [email or web address]

## 2019-12-25 DIAGNOSIS — I10 Essential (primary) hypertension: Secondary | ICD-10-CM | POA: Diagnosis not present

## 2019-12-25 DIAGNOSIS — N184 Chronic kidney disease, stage 4 (severe): Secondary | ICD-10-CM | POA: Diagnosis not present

## 2019-12-25 DIAGNOSIS — I611 Nontraumatic intracerebral hemorrhage in hemisphere, cortical: Secondary | ICD-10-CM | POA: Diagnosis not present

## 2019-12-25 LAB — GLUCOSE, CAPILLARY
Glucose-Capillary: 119 mg/dL — ABNORMAL HIGH (ref 70–99)
Glucose-Capillary: 133 mg/dL — ABNORMAL HIGH (ref 70–99)
Glucose-Capillary: 133 mg/dL — ABNORMAL HIGH (ref 70–99)
Glucose-Capillary: 136 mg/dL — ABNORMAL HIGH (ref 70–99)
Glucose-Capillary: 91 mg/dL (ref 70–99)
Glucose-Capillary: 94 mg/dL (ref 70–99)

## 2019-12-25 LAB — CBC
HCT: 37.3 % — ABNORMAL LOW (ref 39.0–52.0)
Hemoglobin: 11.8 g/dL — ABNORMAL LOW (ref 13.0–17.0)
MCH: 26.6 pg (ref 26.0–34.0)
MCHC: 31.6 g/dL (ref 30.0–36.0)
MCV: 84 fL (ref 80.0–100.0)
Platelets: 313 10*3/uL (ref 150–400)
RBC: 4.44 MIL/uL (ref 4.22–5.81)
RDW: 14.1 % (ref 11.5–15.5)
WBC: 7.7 10*3/uL (ref 4.0–10.5)
nRBC: 0 % (ref 0.0–0.2)

## 2019-12-25 LAB — BASIC METABOLIC PANEL
Anion gap: 7 (ref 5–15)
BUN: 46 mg/dL — ABNORMAL HIGH (ref 6–20)
CO2: 31 mmol/L (ref 22–32)
Calcium: 10.2 mg/dL (ref 8.9–10.3)
Chloride: 100 mmol/L (ref 98–111)
Creatinine, Ser: 1.53 mg/dL — ABNORMAL HIGH (ref 0.61–1.24)
GFR calc Af Amer: >60 mL/min (ref >60)
GFR calc non Af Amer: 56 mL/min — ABNORMAL LOW (ref >60)
Glucose, Bld: 99 mg/dL (ref 70–99)
Potassium: 4.5 mmol/L (ref 3.5–5.1)
Sodium: 138 mmol/L (ref 135–145)

## 2019-12-25 NOTE — Progress Notes (Signed)
Physical Therapy Treatment Patient Details Name: Joshua [name] MRN: [id number] DOB: [birth year] Today's Date: 12/25/2019    History of Present Illness Pt is a 38 y.o. 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 S/p L frontoparietal craniectomy for evacuation of L basal ganglia hematoma 9/12. ETT 9/12-9/18, 9/18-9/23. PEG tube placed 9/28 secondary to dysphagia. PMH includes alcohol abuse, seizures, HTN.    PT Comments    Pt performed transfer training.  He continues to resist movement.  Righting response noted in standing and sitting.  He has difficultly straightening his R knee.  Pt at times weight bearing bilaterally and then weight shifts to the R and becomes total assistance to correct.  Attempted to sit on commode but unable to do anything once seated.  Continue to recommend SNF placement at this time.    Follow Up Recommendations  SNF;Supervision/Assistance - 24 hour     Equipment Recommendations  Wheelchair (measurements PT);Wheelchair cushion (measurements PT);Hospital bed;3in1 (PT)    Recommendations for Other Services Rehab consult     Precautions / Restrictions Precautions Precautions: Fall Precaution Comments: L crani, PEG, helmet Required Braces or Orthoses: Splint/Cast Splint/Cast: resting hand splint and elbow splint for R UE due synergistic pattern Restrictions Weight Bearing Restrictions: No    Mobility  Bed Mobility Overal bed mobility: Needs Assistance Bed Mobility: Supine to Sit;Sit to Supine Rolling: Max assist Sidelying to sit: Max assist       General bed mobility comments: max A for mgmt BLE's and elevation of trunk into sitting, needed min A to maintain sitting initially as well.  Transfers Overall transfer level: Needs assistance Equipment used: Ambulation equipment used(sara stedy) Transfers: Sit to/from Stand   Stand pivot transfers: From elevated surface;Max assist;+2 physical assistance        General transfer comment: Pt performed multiple sit to stand reps.  Required assistance to weight shift L and weight bear on B LEs.  He continues to present with instability in standing.  Noted with B buckling in standing,  Ambulation/Gait Ambulation/Gait assistance: (NT)               Stairs             Wheelchair Mobility    Modified Rankin (Stroke Patients Only)       Balance Overall balance assessment: Needs assistance Sitting-balance support: Feet supported;Single extremity supported Sitting balance-Leahy Scale: Poor       Standing balance-Leahy Scale: Poor                              Cognition Arousal/Alertness: Awake/alert Behavior During Therapy: Flat affect Overall Cognitive Status: Impaired/Different from baseline Area of Impairment: Attention;Following commands;Safety/judgement;Awareness;Problem solving                   Current Attention Level: Focused Memory: Decreased short-term memory Following Commands: Follows one step commands inconsistently Safety/Judgement: Decreased awareness of safety;Decreased awareness of deficits Awareness: Intellectual Problem Solving: Slow processing;Difficulty sequencing;Requires verbal cues;Decreased initiation;Requires tactile cues General Comments: pt makes eye contect each time his name is called. Followed 1 step commands 25%      Exercises      General Comments        Pertinent Vitals/Pain Pain Assessment: Faces Faces Pain Scale: No hurt    Home Living  Prior Function            PT Goals (current goals can now be found in the care plan section) Acute Rehab PT Goals Patient Stated Goal: none stated Potential to Achieve Goals: Fair Progress towards PT goals: Progressing toward goals    Frequency    Min 2X/week      PT Plan Current plan remains appropriate    Co-evaluation              AM-PAC PT "6 Clicks" Mobility    Outcome Measure  Help needed turning from your back to your side while in a flat bed without using bedrails?: Total Help needed moving from lying on your back to sitting on the side of a flat bed without using bedrails?: Total Help needed moving to and from a bed to a chair (including a wheelchair)?: Total Help needed standing up from a chair using your arms (e.g., wheelchair or bedside chair)?: Total Help needed to walk in hospital room?: Total Help needed climbing 3-5 steps with a railing? : Total 6 Click Score: 6    End of Session Equipment Utilized During Treatment: Gait belt Activity Tolerance: Patient tolerated treatment well Patient left: in chair;with chair alarm set(posey alarm belt) Nurse Communication: Mobility status PT Visit Diagnosis: Muscle weakness (generalized) (M62.81);Hemiplegia and hemiparesis;Other abnormalities of gait and mobility (R26.89) Hemiplegia - Right/Left: Right Hemiplegia - dominant/non-dominant: Dominant Hemiplegia - caused by: Nontraumatic intracerebral hemorrhage     Time: [id number] PT Time Calculation (min) (ACUTE ONLY): 36 min  Charges:  $Therapeutic Activity: 23-37 mins                     Joshua [name] , PTA Acute Rehabilitation Services Pager [phone] Office [phone]     Joshua [name] Joshua [name] 12/25/2019, 3:43 PM

## 2019-12-25 NOTE — Progress Notes (Signed)
PROGRESS NOTE    Joshua [name]  [id number] DOB: 1981/02/18 DOA: 09/09/2019 PCP: Patient, [name]   Brief Narrative: Joshua [name] is a 38 y.o. male admitted by neurology on 9/12 with seizure, right hemiplegia, decreased responsiveness found to have left hemispheric intracranial hemorrhage, subsequently had a rapidly declining neurological status, intubated and admitted to ICU, underwent left craniotomy for decompression and evacuation of ICH on 9/12 by Dr. Vertell Limber.Continues to have slow neurological recovery, prolonged hospitalization treated for seizures and aspiration pneumonia.Continues to be awake and alert, but aphasic. PEG tube placement on 9/28.Disposition continues to be challenging, uninsured, needs long-term placement.   Assessment & Plan:   Principal Problem:   ICH (intracerebral hemorrhage) (HCC) Active Problems:   Subdural hematoma (HCC)   Intracerebral hemorrhage (HCC)   Cytotoxic brain edema (HCC)   Hemorrhagic stroke (HCC)   Acute respiratory failure ([place])   Essential hypertension   Severe protein-calorie malnutrition (HCC)   CKD (chronic kidney disease), stage IV (HCC)   Seizure ([place])   Left intracranial hemorrhage Patient underwentdecompressive craniectomy and hematoma evacuation withimplantation of skull flap in the right abdomenby Dr. Vertell Limber in 9/12.Felt to be secondary to uncontrolled, untreated hypertension, CTA was negative for aneurysm or vascular malformation.Remains aphasic/nonverbal with dense right hemiplegia, cognitive impairment, inconsistently follows commands. Previous hospitalist discussed with Dr. Vertell Limber 12/2 regarding flap replacement; given his bedridden status and lack of progress since surgery, no indication for replacement of his skull at this time as would only be a cosmetic fix. Currently a difficult placement.  Seizures Secondary to above -Continue Keppra 500 mg BID  Aspiration pneumonia Dysphagia Completed antibiotics. PEG  tube placed.  Klebsiella oxytoca UTI Treated with Cefazolin  Essential hypertension Stable -Continue amlodipine    DVT prophylaxis: Lovenox Code Status:   Code Status: Full Code Family Communication: None Disposition Plan: Discharge pending safe discharge plan   Consultants:   Neurosurgery  Neurology  GI  Procedures:   9/19: Transthoracic Echocardiogram IMPRESSIONS    1. Left ventricular ejection fraction, by visual estimation, is 70 to 75%. The left ventricle has hyperdynamic function. Normal left ventricular size. There is severely increased left ventricular hypertrophy.  2. Global right ventricle has normal systolic function.The right ventricular size is normal. No increase in right ventricular wall thickness.  3. Left atrial size was normal.  4. Right atrial size was moderately dilated.  5. The mitral valve is grossly normal. Mild mitral valve regurgitation.  6. The tricuspid valve is grossly normal. Tricuspid valve regurgitation is trivial.  7. The aortic valve is tricuspid Aortic valve regurgitation was not visualized by color flow Doppler.  8. The pulmonic valve was grossly normal. Pulmonic valve regurgitation is not visualized by color flow Doppler.  9. The inferior vena cava was not able to be adequately assessed to estimate CVP.   10/1: EGD Findings:      No gross lesions were noted in the esophagus.      No gross lesions were noted in the stomach. Placement of an externally       removable PEG with no T-fasteners was successfully completed. The       external bumper was at the 3.0 cm marking on the tube. Estimated blood       loss was minimal.      No gross lesions were noted in the second portion of the duodenum. Impression:               - No gross lesions in esophagus.                           -  No gross lesions in the stomach.                           - No gross lesions in the second portion of the                            duodenum.                            - An externally removable PEG placement was                            successfully completed.                           - No specimens collected. Recommendation:           may use for Meds now, tube feeds in 4h   Antimicrobials:      Subjective: Aphasic  Objective: Vitals:   12/25/19 0400 12/25/19 0500 12/25/19 0525 12/25/19 0825  BP: 114/85  116/78 126/84  Pulse: (!) 110   (!) 112  Resp: 16   17  Temp: 98.3 F (36.8 C)   99.1 F (37.3 C)  TempSrc: Oral   Axillary  SpO2: 98%   100%  Weight:  60.5 kg    Height:        Intake/Output Summary (Last 24 hours) at 12/25/2019 1109 Last data filed at 12/25/2019 [id number] Gross per 24 hour  Intake 0 ml  Output 1200 ml  Net -1200 ml   Filed Weights   12/22/19 0044 12/24/19 0500 12/25/19 0500  Weight: 59 kg 60.7 kg 60.5 kg    Examination:  General: Well appearing, no distress    Data Reviewed: I have personally reviewed following labs and imaging studies  CBC: No results for input(s): WBC, NEUTROABS, HGB, HCT, MCV, PLT in the last 168 hours. Basic Metabolic Panel: No results for input(s): NA, K, CL, CO2, GLUCOSE, BUN, CREATININE, CALCIUM, MG, PHOS in the last 168 hours. GFR: Estimated Creatinine Clearance: 65.6 mL/min (A) (by C-G formula based on SCr of 1.28 mg/dL (H)). Liver Function Tests: No results for input(s): AST, ALT, ALKPHOS, BILITOT, PROT, ALBUMIN in the last 168 hours. No results for input(s): LIPASE, AMYLASE in the last 168 hours. No results for input(s): AMMONIA in the last 168 hours. Coagulation Profile: No results for input(s): INR, PROTIME in the last 168 hours. Cardiac Enzymes: No results for input(s): CKTOTAL, CKMB, CKMBINDEX, TROPONINI in the last 168 hours. BNP (last 3 results) No results for input(s): PROBNP in the last 8760 hours. HbA1C: No results for input(s): HGBA1C in the last 72 hours. CBG: Recent Labs  Lab 12/24/19 1657 12/24/19 2020 12/24/19 2328 12/25/19 0357  12/25/19 0822  GLUCAP 99 129* 93 133* 94   Lipid Profile: No results for input(s): CHOL, HDL, LDLCALC, TRIG, CHOLHDL, LDLDIRECT in the last 72 hours. Thyroid Function Tests: No results for input(s): TSH, T4TOTAL, FREET4, T3FREE, THYROIDAB in the last 72 hours. Anemia Panel: No results for input(s): VITAMINB12, FOLATE, FERRITIN, TIBC, IRON, RETICCTPCT in the last 72 hours. Sepsis Labs: No results for input(s): PROCALCITON, LATICACIDVEN in the last 168 hours.  No results found for this or any previous visit (from the past 240 hour(s)).       Radiology Studies:  No results found.      Scheduled Meds: . amLODipine  10 mg Per Tube Daily  . chlorhexidine  15 mL Mouth Rinse BID  . enoxaparin (LOVENOX) injection  40 mg Subcutaneous Daily  . feeding supplement (OSMOLITE 1.2 CAL)  474 mL Per Tube QID  . feeding supplement (PRO-STAT SUGAR FREE 64)  30 mL Per Tube BID  . folic acid  1 mg Per Tube Daily  . free water  300 mL Per Tube Q4H  . hydrALAZINE  25 mg Per Tube Q8H  . insulin aspart  0-15 Units Subcutaneous Q4H  . levETIRAcetam  500 mg Per Tube BID  . mouth rinse  15 mL Mouth Rinse q12n4p  . metoprolol tartrate  100 mg Per Tube BID  . multivitamin  15 mL Per Tube Daily  . pantoprazole sodium  40 mg Per Tube BID  . QUEtiapine  50 mg Per Tube QHS  . sodium chloride flush  10-40 mL Intracatheter Q12H  . thiamine  100 mg Per Tube Daily   Continuous Infusions:   LOS: 107 days     Cordelia Poche, MD [facility] 12/25/2019, 11:09 AM  If 7PM-7AM, please contact night-coverage [email or web address]

## 2019-12-25 NOTE — Plan of Care (Signed)
  Problem: Pain Managment: Goal: General experience of comfort will improve Outcome: Progressing   

## 2019-12-25 NOTE — TOC Progression Note (Signed)
Transition of Care [facility]) - Progression Note    Patient Details  Name: Hubert Greger MRN: [id number] Date of Birth: October 25, 1981  [facility] CM/SW Contact  Pollie Friar, RN Phone Number: 12/25/2019, 3:26 PM  Clinical Narrative:    CM spoke to patients sister over the phone. She is asking that patient's information also be sent out to facilitys near her in [place] and [place]. CM has explained that the patient has his Medicaid but that the disability portion has not been approved and he will need this to pay for rehab. She voiced understanding. She provided [facility] as a choice and CM has left voicemail for their admissions person.  TOC following.   Expected Discharge Plan: Skilled Nursing Facility Barriers to Discharge: SNF Pending payor source - [facility], SNF Pending bed offer, Inadequate or no insurance  Expected Discharge Plan and Services Expected Discharge Plan: [place] In-house Referral: Clinical Social Work Discharge Planning Services: NA Post Acute Care Choice: [place] Living arrangements for the past 2 months: Apartment                 DME Arranged: N/A DME Agency: NA       HH Arranged: NA HH Agency: NA         Social Determinants of Health (SDOH) Interventions    Readmission Risk Interventions No flowsheet data found.

## 2019-12-26 DIAGNOSIS — N184 Chronic kidney disease, stage 4 (severe): Secondary | ICD-10-CM | POA: Diagnosis not present

## 2019-12-26 DIAGNOSIS — I619 Nontraumatic intracerebral hemorrhage, unspecified: Secondary | ICD-10-CM | POA: Diagnosis not present

## 2019-12-26 DIAGNOSIS — I10 Essential (primary) hypertension: Secondary | ICD-10-CM | POA: Diagnosis not present

## 2019-12-26 DIAGNOSIS — I611 Nontraumatic intracerebral hemorrhage in hemisphere, cortical: Secondary | ICD-10-CM | POA: Diagnosis not present

## 2019-12-26 LAB — GLUCOSE, CAPILLARY
Glucose-Capillary: 99 mg/dL (ref 70–99)
Glucose-Capillary: 101 mg/dL — ABNORMAL HIGH (ref 70–99)
Glucose-Capillary: 129 mg/dL — ABNORMAL HIGH (ref 70–99)
Glucose-Capillary: 134 mg/dL — ABNORMAL HIGH (ref 70–99)
Glucose-Capillary: 132 mg/dL — ABNORMAL HIGH (ref 70–99)

## 2019-12-26 NOTE — Plan of Care (Signed)
  Problem: Nutrition: Goal: Adequate nutrition will be maintained Outcome: Not Progressing   

## 2019-12-26 NOTE — Progress Notes (Signed)
PROGRESS NOTE    Joshua [name]  [id number] DOB: 01-24-1981 DOA: 09/09/2019 PCP: Patient, [name]   Brief Narrative: Joshua [name] is a 38 y.o. male admitted by neurology on 9/12 with seizure, right hemiplegia, decreased responsiveness found to have left hemispheric intracranial hemorrhage, subsequently had a rapidly declining neurological status, intubated and admitted to ICU, underwent left craniotomy for decompression and evacuation of ICH on 9/12 by Dr. Vertell Limber.Continues to have slow neurological recovery, prolonged hospitalization treated for seizures and aspiration pneumonia.Continues to be awake and alert, but aphasic. PEG tube placement on 9/28.Disposition continues to be challenging, uninsured, needs long-term placement.   Assessment & Plan:   Principal Problem:   ICH (intracerebral hemorrhage) (HCC) Active Problems:   Subdural hematoma (HCC)   Intracerebral hemorrhage (HCC)   Cytotoxic brain edema (HCC)   Hemorrhagic stroke (HCC)   Acute respiratory failure ([place])   Essential hypertension   Severe protein-calorie malnutrition (HCC)   CKD (chronic kidney disease), stage IV (HCC)   Seizure ([place])   Left intracranial hemorrhage Patient underwentdecompressive craniectomy and hematoma evacuation withimplantation of skull flap in the right abdomenby Dr. Vertell Limber in 9/12.Felt to be secondary to uncontrolled, untreated hypertension, CTA was negative for aneurysm or vascular malformation.Remains aphasic/nonverbal with dense right hemiplegia, cognitive impairment, inconsistently follows commands. Previous hospitalist discussed with Dr. Vertell Limber 12/2 regarding flap replacement; given his bedridden status and lack of progress since surgery, no indication for replacement of his skull at this time as would only be a cosmetic fix. Currently a difficult placement.  Seizures Secondary to above -Continue Keppra 500 mg BID  Aspiration pneumonia Dysphagia Completed antibiotics. PEG  tube placed.  Klebsiella oxytoca UTI Treated with Cefazolin  Essential hypertension Stable -Continue amlodipine    DVT prophylaxis: Lovenox Code Status:   Code Status: Full Code Family Communication: None Disposition Plan: Discharge pending safe discharge plan   Consultants:   Neurosurgery  Neurology  GI  Procedures:   9/19: Transthoracic Echocardiogram IMPRESSIONS    1. Left ventricular ejection fraction, by visual estimation, is 70 to 75%. The left ventricle has hyperdynamic function. Normal left ventricular size. There is severely increased left ventricular hypertrophy.  2. Global right ventricle has normal systolic function.The right ventricular size is normal. No increase in right ventricular wall thickness.  3. Left atrial size was normal.  4. Right atrial size was moderately dilated.  5. The mitral valve is grossly normal. Mild mitral valve regurgitation.  6. The tricuspid valve is grossly normal. Tricuspid valve regurgitation is trivial.  7. The aortic valve is tricuspid Aortic valve regurgitation was not visualized by color flow Doppler.  8. The pulmonic valve was grossly normal. Pulmonic valve regurgitation is not visualized by color flow Doppler.  9. The inferior vena cava was not able to be adequately assessed to estimate CVP.   10/1: EGD Findings:      No gross lesions were noted in the esophagus.      No gross lesions were noted in the stomach. Placement of an externally       removable PEG with no T-fasteners was successfully completed. The       external bumper was at the 3.0 cm marking on the tube. Estimated blood       loss was minimal.      No gross lesions were noted in the second portion of the duodenum. Impression:               - No gross lesions in esophagus.                           -  No gross lesions in the stomach.                           - No gross lesions in the second portion of the                            duodenum.                            - An externally removable PEG placement was                            successfully completed.                           - No specimens collected. Recommendation:           may use for Meds now, tube feeds in 4h   Antimicrobials:      Subjective: Aphasia  Objective: Vitals:   12/25/19 2316 12/26/19 0338 12/26/19 0400 12/26/19 0828  BP: 111/79 118/88  (!) 137/99  Pulse: 96 91  (!) 105  Resp: 20 20  20   Temp: 98.3 F (36.8 C) 97.9 F (36.6 C)  98.1 F (36.7 C)  TempSrc: Oral   Oral  SpO2: 95% 100%  100%  Weight:   59.5 kg   Height:        Intake/Output Summary (Last 24 hours) at 12/26/2019 1149 Last data filed at 12/26/2019 0400 Gross per 24 hour  Intake -  Output 1500 ml  Net -1500 ml   Filed Weights   12/24/19 0500 12/25/19 0500 12/26/19 0400  Weight: 60.7 kg 60.5 kg 59.5 kg    Examination:  General exam: Appears calm and comfortable Respiratory system: Clear to auscultation. Respiratory effort normal. Cardiovascular system: S1 & S2 heard, RRR. No murmurs, rubs, gallops or clicks. Gastrointestinal system: Abdomen is nondistended, soft and nontender. No organomegaly or masses felt. Normal bowel sounds heard. Central nervous system: Alert. Right sided hemiparesis. Follows command when asked to squeeze my hand Extremities: No edema. No calf tenderness Skin: No cyanosis. No rashes Psychiatry: Judgement and insight appear impaired. Flat affect    Data Reviewed: I have personally reviewed following labs and imaging studies  CBC: Recent Labs  Lab 12/25/19 1118  WBC 7.7  HGB 11.8*  HCT 37.3*  MCV 84.0  PLT [id number]   Basic Metabolic Panel: Recent Labs  Lab 12/25/19 1118  NA 138  K 4.5  CL 100  CO2 31  GLUCOSE 99  BUN 46*  CREATININE 1.53*  CALCIUM 10.2   GFR: Estimated Creatinine Clearance: 54 mL/min (A) (by C-G formula based on SCr of 1.53 mg/dL (H)). Liver Function Tests: No results for input(s): AST, ALT, ALKPHOS, BILITOT, PROT,  ALBUMIN in the last 168 hours. No results for input(s): LIPASE, AMYLASE in the last 168 hours. No results for input(s): AMMONIA in the last 168 hours. Coagulation Profile: No results for input(s): INR, PROTIME in the last 168 hours. Cardiac Enzymes: No results for input(s): CKTOTAL, CKMB, CKMBINDEX, TROPONINI in the last 168 hours. BNP (last 3 results) No results for input(s): PROBNP in the last 8760 hours. HbA1C: No results for input(s): HGBA1C in the last 72 hours. CBG: Recent Labs  Lab 12/25/19 1627 12/25/19 2025 12/25/19 2341 12/26/19 0355 12/26/19 [id number]  GLUCAP 136* 119* 133* 99 101*   Lipid Profile: No results for input(s): CHOL, HDL, LDLCALC, TRIG, CHOLHDL, LDLDIRECT in the last 72 hours. Thyroid Function Tests: No results for input(s): TSH, T4TOTAL, FREET4, T3FREE, THYROIDAB in the last 72 hours. Anemia Panel: No results for input(s): VITAMINB12, FOLATE, FERRITIN, TIBC, IRON, RETICCTPCT in the last 72 hours. Sepsis Labs: No results for input(s): PROCALCITON, LATICACIDVEN in the last 168 hours.  No results found for this or any previous visit (from the past 240 hour(s)).       Radiology Studies: No results found.      Scheduled Meds: . amLODipine  10 mg Per Tube Daily  . chlorhexidine  15 mL Mouth Rinse BID  . enoxaparin (LOVENOX) injection  40 mg Subcutaneous Daily  . feeding supplement (OSMOLITE 1.2 CAL)  474 mL Per Tube QID  . feeding supplement (PRO-STAT SUGAR FREE 64)  30 mL Per Tube BID  . folic acid  1 mg Per Tube Daily  . free water  300 mL Per Tube Q4H  . hydrALAZINE  25 mg Per Tube Q8H  . insulin aspart  0-15 Units Subcutaneous Q4H  . levETIRAcetam  500 mg Per Tube BID  . mouth rinse  15 mL Mouth Rinse q12n4p  . metoprolol tartrate  100 mg Per Tube BID  . multivitamin  15 mL Per Tube Daily  . pantoprazole sodium  40 mg Per Tube BID  . QUEtiapine  50 mg Per Tube QHS  . sodium chloride flush  10-40 mL Intracatheter Q12H  . thiamine  100 mg Per  Tube Daily   Continuous Infusions:   LOS: 108 days     Cordelia Poche, MD [facility] 12/26/2019, 11:49 AM  If 7PM-7AM, please contact night-coverage [email or web address]

## 2019-12-27 DIAGNOSIS — I619 Nontraumatic intracerebral hemorrhage, unspecified: Secondary | ICD-10-CM | POA: Diagnosis not present

## 2019-12-27 DIAGNOSIS — N179 Acute kidney failure, unspecified: Secondary | ICD-10-CM | POA: Diagnosis not present

## 2019-12-27 DIAGNOSIS — I611 Nontraumatic intracerebral hemorrhage in hemisphere, cortical: Secondary | ICD-10-CM | POA: Diagnosis not present

## 2019-12-27 DIAGNOSIS — N184 Chronic kidney disease, stage 4 (severe): Secondary | ICD-10-CM | POA: Diagnosis not present

## 2019-12-27 LAB — COMPREHENSIVE METABOLIC PANEL
ALT: 24 U/L (ref 0–44)
AST: 18 U/L (ref 15–41)
Albumin: 3 g/dL — ABNORMAL LOW (ref 3.5–5.0)
Alkaline Phosphatase: 75 U/L (ref 38–126)
Anion gap: 11 (ref 5–15)
BUN: 43 mg/dL — ABNORMAL HIGH (ref 6–20)
CO2: 28 mmol/L (ref 22–32)
Calcium: 10.2 mg/dL (ref 8.9–10.3)
Chloride: 101 mmol/L (ref 98–111)
Creatinine, Ser: 1.38 mg/dL — ABNORMAL HIGH (ref 0.61–1.24)
GFR calc Af Amer: >60 mL/min (ref >60)
GFR calc non Af Amer: >60 mL/min (ref >60)
Glucose, Bld: 79 mg/dL (ref 70–99)
Potassium: 4.6 mmol/L (ref 3.5–5.1)
Sodium: 140 mmol/L (ref 135–145)
Total Bilirubin: 0.1 mg/dL — ABNORMAL LOW (ref 0.3–1.2)
Total Protein: 7 g/dL (ref 6.5–8.1)

## 2019-12-27 LAB — CBC
HCT: 35.4 % — ABNORMAL LOW (ref 39.0–52.0)
Hemoglobin: 11.3 g/dL — ABNORMAL LOW (ref 13.0–17.0)
MCH: 26.5 pg (ref 26.0–34.0)
MCHC: 31.9 g/dL (ref 30.0–36.0)
MCV: 82.9 fL (ref 80.0–100.0)
Platelets: 309 10*3/uL (ref 150–400)
RBC: 4.27 MIL/uL (ref 4.22–5.81)
RDW: 14.2 % (ref 11.5–15.5)
WBC: 7 10*3/uL (ref 4.0–10.5)
nRBC: 0 % (ref 0.0–0.2)

## 2019-12-27 LAB — GLUCOSE, CAPILLARY
Glucose-Capillary: 176 mg/dL — ABNORMAL HIGH (ref 70–99)
Glucose-Capillary: 83 mg/dL (ref 70–99)
Glucose-Capillary: 105 mg/dL — ABNORMAL HIGH (ref 70–99)
Glucose-Capillary: 126 mg/dL — ABNORMAL HIGH (ref 70–99)
Glucose-Capillary: 109 mg/dL — ABNORMAL HIGH (ref 70–99)
Glucose-Capillary: 106 mg/dL — ABNORMAL HIGH (ref 70–99)

## 2019-12-27 NOTE — Progress Notes (Signed)
PROGRESS NOTE    Joshua [name]  [id number] DOB: 08-20-1981 DOA: 09/09/2019 PCP: Patient, [name]   Brief Narrative: Joshua [name] is a 38 y.o. male admitted by neurology on 9/12 with seizure, right hemiplegia, decreased responsiveness found to have left hemispheric intracranial hemorrhage, subsequently had a rapidly declining neurological status, intubated and admitted to ICU, underwent left craniotomy for decompression and evacuation of ICH on 9/12 by Dr. Vertell Limber.Continues to have slow neurological recovery, prolonged hospitalization treated for seizures and aspiration pneumonia.Continues to be awake and alert, but aphasic. PEG tube placement on 9/28.Disposition continues to be challenging, uninsured, needs long-term placement.  Assessment & Plan:   Principal Problem:   ICH (intracerebral hemorrhage) (HCC) Active Problems:   Subdural hematoma (HCC)   Intracerebral hemorrhage (HCC)   Cytotoxic brain edema (HCC)   Hemorrhagic stroke (HCC)   Acute respiratory failure ([place])   Essential hypertension   Severe protein-calorie malnutrition (HCC)   CKD (chronic kidney disease), stage IV (HCC)   Seizure ([place])   Left intracranial hemorrhage Patient underwentdecompressive craniectomy and hematoma evacuation withimplantation of skull flap in the right abdomenby Dr. Vertell Limber in 9/12.Felt to be secondary to uncontrolled, untreated hypertension, CTA was negative for aneurysm or vascular malformation.Remains aphasic/nonverbal with dense right hemiplegia, cognitive impairment, inconsistently follows commands. Previous hospitalist discussed with Dr. Vertell Limber 12/2 regarding flap replacement; given his bedridden status and lack of progress since surgery, no indication for replacement of his skull at this time as would only be a cosmetic fix. Currently a difficult placement.  Seizures Secondary to above -Continue Keppra 500 mg BID  Aspiration pneumonia Dysphagia Completed antibiotics. PEG tube  placed.  Klebsiella oxytoca UTI Treated with Cefazolin  Essential hypertension Stable -Continue amlodipine   DVT prophylaxis: Lovenox Code Status: Full Family Communication: None Disposition Plan: Discharge pending safe discharge plan   Consultants:   Neurosurgery  Neurology  GI  Procedures:   9/19: Transthoracic Echocardiogram IMPRESSIONS    1. Left ventricular ejection fraction, by visual estimation, is 70 to 75%. The left ventricle has hyperdynamic function. Normal left ventricular size. There is severely increased left ventricular hypertrophy.  2. Global right ventricle has normal systolic function.The right ventricular size is normal. No increase in right ventricular wall thickness.  3. Left atrial size was normal.  4. Right atrial size was moderately dilated.  5. The mitral valve is grossly normal. Mild mitral valve regurgitation.  6. The tricuspid valve is grossly normal. Tricuspid valve regurgitation is trivial.  7. The aortic valve is tricuspid Aortic valve regurgitation was not visualized by color flow Doppler.  8. The pulmonic valve was grossly normal. Pulmonic valve regurgitation is not visualized by color flow Doppler.  9. The inferior vena cava was not able to be adequately assessed to estimate CVP.   10/1: EGD Findings:      No gross lesions were noted in the esophagus.      No gross lesions were noted in the stomach. Placement of an externally       removable PEG with no T-fasteners was successfully completed. The       external bumper was at the 3.0 cm marking on the tube. Estimated blood       loss was minimal.      No gross lesions were noted in the second portion of the duodenum. Impression:               - No gross lesions in esophagus.                           -  No gross lesions in the stomach.                           - No gross lesions in the second portion of the                            duodenum.                           - An  externally removable PEG placement was                            successfully completed.                           - No specimens collected. Recommendation:           may use for Meds now, tube feeds in 4h   Antimicrobials:  Completed course of cefazolin.  Subjective:  patient was sleeping when seen this morning.  Easily arousable but aphasic.  Objective: Vitals:   12/27/19 0323 12/27/19 0654 12/27/19 0856 12/27/19 1306  BP: (!) 123/98 128/70 (!) 132/94 113/79  Pulse: 87  96 87  Resp: 20  18 16   Temp: 98.4 F (36.9 C)  98.6 F (37 C) 98.8 F (37.1 C)  TempSrc:   Oral Oral  SpO2: 100%  100% 100%  Weight:      Height:        Intake/Output Summary (Last 24 hours) at 12/27/2019 1510 Last data filed at 12/27/2019 0400 Gross per 24 hour  Intake 0 ml  Output 1000 ml  Net -1000 ml   Filed Weights   12/24/19 0500 12/25/19 0500 12/26/19 0400  Weight: 60.7 kg 60.5 kg 59.5 kg    Examination:  General exam: Appears calm and comfortable Respiratory system: Clear to auscultation. Respiratory effort normal. Cardiovascular system: S1 & S2 heard, RRR. No murmurs, rubs, gallops or clicks. Gastrointestinal system: Abdomen is nondistended, soft and nontender. No organomegaly or masses felt. Normal bowel sounds heard. Central nervous system: Alert. Right sided hemiparesis. Follows command when asked to squeeze my hand Extremities: No edema. No calf tenderness Skin: No cyanosis. No rashes Psychiatry: Judgement and insight appear impaired. Flat affect  Data Reviewed: I have personally reviewed following labs and imaging studies  CBC: Recent Labs  Lab 12/25/19 1118 12/27/19 0301  WBC 7.7 7.0  HGB 11.8* 11.3*  HCT 37.3* 35.4*  MCV 84.0 82.9  PLT 313 [id number]   Basic Metabolic Panel: Recent Labs  Lab 12/25/19 1118 12/27/19 0301  NA 138 140  K 4.5 4.6  CL 100 101  CO2 31 28  GLUCOSE 99 79  BUN 46* 43*  CREATININE 1.53* 1.38*  CALCIUM 10.2 10.2   GFR: Estimated  Creatinine Clearance: 59.9 mL/min (A) (by C-G formula based on SCr of 1.38 mg/dL (H)). Liver Function Tests: Recent Labs  Lab 12/27/19 0301  AST 18  ALT 24  ALKPHOS 75  BILITOT 0.1*  PROT 7.0  ALBUMIN 3.0*   No results for input(s): LIPASE, AMYLASE in the last 168 hours. No results for input(s): AMMONIA in the last 168 hours. Coagulation Profile: No results for input(s): INR, PROTIME in the last 168 hours. Cardiac Enzymes: No results for input(s): CKTOTAL, CKMB, CKMBINDEX, TROPONINI in the last 168 hours. BNP (  last 3 results) No results for input(s): PROBNP in the last 8760 hours. HbA1C: No results for input(s): HGBA1C in the last 72 hours. CBG: Recent Labs  Lab 12/26/19 1911 12/26/19 2308 12/27/19 0343 12/27/19 0859 12/27/19 1309  GLUCAP 132* 176* 83 105* 126*   Lipid Profile: No results for input(s): CHOL, HDL, LDLCALC, TRIG, CHOLHDL, LDLDIRECT in the last 72 hours. Thyroid Function Tests: No results for input(s): TSH, T4TOTAL, FREET4, T3FREE, THYROIDAB in the last 72 hours. Anemia Panel: No results for input(s): VITAMINB12, FOLATE, FERRITIN, TIBC, IRON, RETICCTPCT in the last 72 hours. Sepsis Labs: No results for input(s): PROCALCITON, LATICACIDVEN in the last 168 hours.  No results found for this or any previous visit (from the past 240 hour(s)).    Radiology Studies: No results found.  Scheduled Meds: . amLODipine  10 mg Per Tube Daily  . chlorhexidine  15 mL Mouth Rinse BID  . enoxaparin (LOVENOX) injection  40 mg Subcutaneous Daily  . feeding supplement (OSMOLITE 1.2 CAL)  474 mL Per Tube QID  . feeding supplement (PRO-STAT SUGAR FREE 64)  30 mL Per Tube BID  . folic acid  1 mg Per Tube Daily  . free water  300 mL Per Tube Q4H  . hydrALAZINE  25 mg Per Tube Q8H  . insulin aspart  0-15 Units Subcutaneous Q4H  . levETIRAcetam  500 mg Per Tube BID  . mouth rinse  15 mL Mouth Rinse q12n4p  . metoprolol tartrate  100 mg Per Tube BID  . multivitamin  15 mL  Per Tube Daily  . pantoprazole sodium  40 mg Per Tube BID  . QUEtiapine  50 mg Per Tube QHS  . sodium chloride flush  10-40 mL Intracatheter Q12H  . thiamine  100 mg Per Tube Daily   Continuous Infusions:   LOS: 109 days   Lorella Nimrod, MD     [facility] 12/27/2019, 3:10PM PM Pager [phone]  If 7PM-7AM, please contact night-coverage [email or web address] Password [email or web address]  This record has been created using Systems analyst. Errors have been sought and corrected,but may not always be located. Such creation errors do not reflect on the standard of care.

## 2019-12-27 NOTE — Progress Notes (Signed)
Nutrition Follow-up  DOCUMENTATION CODES:   Severe malnutrition in context of acute illness/injury  INTERVENTION:  ContinueOsmolite 1.2 formula via PEG at goal volume of 474 ml (2 cartons/ARCs) given QID.   Provide 30 ml Prostat BID per tube.   Free water flushes of 300 ml every 4 hours per tube. (MD to adjust as appropriate)  Tube feeding to provide 2475 kcal, 135 grams of protein, and 3355 ml free water.  Continue PO intake for comfort as appropriate/tolerated.  NUTRITION DIAGNOSIS:   Severe Malnutrition related to acute illness(intracerebral hemorrage) as evidenced by severe fat depletion, severe muscle depletion; ongoing  GOAL:   Patient will meet greater than or equal to 90% of their needs; met with TF  MONITOR:   TF tolerance, Skin, Weight trends, Labs, I & O's  REASON FOR ASSESSMENT:   Ventilator, Consult Enteral/tube feeding initiation and management  ASSESSMENT:   38 yo male admitted with large left intracranial hemorrhage; S/P craniotomy. PMH includes HTN, alcoholism, noncompliance.  9/12left frontal temporal craniectomy and evacuation hematoma.  9/23 Extubated.  10/1 PEG placed 11/23- Nutrition Focused Physical Exam performed, severe subcutaneous fat loss and severe muscle wasting identified.   Pt continues on trial of po intake for comfort. Meal completion 0%. Pt continues on his tube feeding regimen and has been tolerating it well. RD to continue with current orders and monitor for continued tolerance.   Labs and medications reviewed.   Diet Order:   Diet Order            Diet regular Room service appropriate? No; Fluid consistency: Thin  Diet effective now              EDUCATION NEEDS:   Not appropriate for education at this time  Skin:  Skin Assessment: Reviewed RN Assessment  Last BM:  12/29  Height:   Ht Readings from Last 1 Encounters:  09/09/19 '5\' 9"'  (1.753 m)    Weight:   Wt Readings from Last 1 Encounters:   12/26/19 59.5 kg    Ideal Body Weight:  72.7 kg  BMI:  Body mass index is 19.37 kg/m.  Estimated Nutritional Needs:   Kcal:  2100-2400  Protein:  105-125 grams  Fluid:  >/= 2 L/day    Corrin Parker, MS, RD, LDN Pager # [phone] After hours/ weekend pager # [phone]

## 2019-12-28 DIAGNOSIS — N179 Acute kidney failure, unspecified: Secondary | ICD-10-CM | POA: Diagnosis not present

## 2019-12-28 DIAGNOSIS — I619 Nontraumatic intracerebral hemorrhage, unspecified: Secondary | ICD-10-CM | POA: Diagnosis not present

## 2019-12-28 DIAGNOSIS — I611 Nontraumatic intracerebral hemorrhage in hemisphere, cortical: Secondary | ICD-10-CM | POA: Diagnosis not present

## 2019-12-28 DIAGNOSIS — N184 Chronic kidney disease, stage 4 (severe): Secondary | ICD-10-CM | POA: Diagnosis not present

## 2019-12-28 LAB — GLUCOSE, CAPILLARY
Glucose-Capillary: 102 mg/dL — ABNORMAL HIGH (ref 70–99)
Glucose-Capillary: 113 mg/dL — ABNORMAL HIGH (ref 70–99)
Glucose-Capillary: 143 mg/dL — ABNORMAL HIGH (ref 70–99)
Glucose-Capillary: 82 mg/dL (ref 70–99)
Glucose-Capillary: 89 mg/dL (ref 70–99)
Glucose-Capillary: 96 mg/dL (ref 70–99)

## 2019-12-28 NOTE — Progress Notes (Signed)
  Speech Language Pathology Treatment: Dysphagia  Patient Details Name: Joshua [name] MRN: [id number] DOB: 02/05/1981 Today's Date: 12/28/2019 Time: [id number] SLP Time Calculation (min) (ACUTE ONLY): 19 min  Assessment / Plan / Recommendation Clinical Impression  Pt in bed, sitting upright with HOB raised. Pt lean to his left. Pt able to visually track clinician and demonstrated joint attn. Patient did not verbalize when seen today. Patient was cooperative, able to hold cup and siphon thin liquids/water through straw. Pt with good oral acceptance and swallow initiation appeared to be timely. No overt s/s aspiration seen with thin liquid trials. Pt also seen with cup sips of thin liquids, resulting in very mild L anterior labial spillage, no overt s/s aspiration or distress. Pt trialed with puree solids (applesauce), some labial spillage bilaterally, good bolus manipulation, slightly prolonged oral transit before the swallow. Good oral clearance, no overt s/s aspiration or distress. Patient consuming approx 25% of applesauce cup. He was able to follow some simple verbal commands intermittently. Continue to follow.   HPI HPI: Pt is a 38 year old male who presented with new seizure, and was found to have a large left BG intracranial hemorrhage s/p L frontal temporal craniectomy with evacuation 9/12. ETT 9/12-9/18; 9/18-9/23. Has been seen repeatedly by SLP, pts attention impacts ability to take PO, resists assistance, has been NPO since admit. Has recently shown interest in water, SLP reconsulted. PMH: HTN, alcoholism, noncompliance.  Pt has been on a po diet but refuses to eat with this SLP.  RN reports pt has been resistant to tube feeding.      SLP Plan  Continue with current plan of care       Recommendations  Diet recommendations: Thin liquid;Regular Liquids provided via: Cup;Straw Medication Administration: Via alternative means Compensations: Minimize environmental distractions                 Follow up Recommendations: Skilled Nursing facility SLP Visit Diagnosis: Dysphagia, unspecified (R13.10) Plan: Continue with current plan of care       [place], M.Ed., CCC-SLP Speech Therapy Acute Rehab 12/28/2019, 12:53 PM

## 2019-12-28 NOTE — Progress Notes (Signed)
PROGRESS NOTE    Joshua [name]  [id number] DOB: 1981/09/04 DOA: 09/09/2019 PCP: Patient, [name]   Brief Narrative: Joshua [name] is a 38 y.o. male admitted by neurology on 9/12 with seizure, right hemiplegia, decreased responsiveness found to have left hemispheric intracranial hemorrhage, subsequently had a rapidly declining neurological status, intubated and admitted to ICU, underwent left craniotomy for decompression and evacuation of ICH on 9/12 by Dr. Vertell Limber.Continues to have slow neurological recovery, prolonged hospitalization treated for seizures and aspiration pneumonia.Continues to be awake and alert, but aphasic. PEG tube placement on 9/28.Disposition continues to be challenging, uninsured, needs long-term placement.  Assessment & Plan:   Principal Problem:   ICH (intracerebral hemorrhage) (HCC) Active Problems:   Subdural hematoma (HCC)   Intracerebral hemorrhage (HCC)   Cytotoxic brain edema (HCC)   Hemorrhagic stroke (HCC)   Acute respiratory failure ([place])   Essential hypertension   Severe protein-calorie malnutrition (HCC)   CKD (chronic kidney disease), stage IV (HCC)   Seizure ([place])   AKI (acute kidney injury) ([place])   Left intracranial hemorrhage Patient underwentdecompressive craniectomy and hematoma evacuation withimplantation of skull flap in the right abdomenby Dr. Vertell Limber in 9/12.Felt to be secondary to uncontrolled, untreated hypertension, CTA was negative for aneurysm or vascular malformation.Remains aphasic/nonverbal with dense right hemiplegia, cognitive impairment, inconsistently follows commands. Previous hospitalist discussed with Dr. Vertell Limber 12/2 regarding flap replacement; given his bedridden status and lack of progress since surgery, no indication for replacement of his skull at this time as would only be a cosmetic fix. Currently a difficult placement.  Seizures Secondary to above -Continue Keppra 500 mg BID  Aspiration  pneumonia Dysphagia Completed antibiotics. PEG tube placed. Swallow team following-appreciate their recommendations.  Klebsiella oxytoca UTI Treated with Cefazolin  Essential hypertension Stable -Continue amlodipine   DVT prophylaxis: Lovenox Code Status: Full Family Communication: None Disposition Plan: Discharge pending safe discharge plan   Consultants:   Neurosurgery  Neurology  GI  Procedures:   9/19: Transthoracic Echocardiogram IMPRESSIONS    1. Left ventricular ejection fraction, by visual estimation, is 70 to 75%. The left ventricle has hyperdynamic function. Normal left ventricular size. There is severely increased left ventricular hypertrophy.  2. Global right ventricle has normal systolic function.The right ventricular size is normal. No increase in right ventricular wall thickness.  3. Left atrial size was normal.  4. Right atrial size was moderately dilated.  5. The mitral valve is grossly normal. Mild mitral valve regurgitation.  6. The tricuspid valve is grossly normal. Tricuspid valve regurgitation is trivial.  7. The aortic valve is tricuspid Aortic valve regurgitation was not visualized by color flow Doppler.  8. The pulmonic valve was grossly normal. Pulmonic valve regurgitation is not visualized by color flow Doppler.  9. The inferior vena cava was not able to be adequately assessed to estimate CVP.   10/1: EGD Findings:      No gross lesions were noted in the esophagus.      No gross lesions were noted in the stomach. Placement of an externally       removable PEG with no T-fasteners was successfully completed. The       external bumper was at the 3.0 cm marking on the tube. Estimated blood       loss was minimal.      No gross lesions were noted in the second portion of the duodenum. Impression:               - No gross lesions  in esophagus.                           - No gross lesions in the stomach.                           - No gross  lesions in the second portion of the                            duodenum.                           - An externally removable PEG placement was                            successfully completed.                           - No specimens collected. Recommendation:           may use for Meds now, tube feeds in 4h   Antimicrobials:  Completed course of cefazolin.  Subjective:  patient was sleeping when seen this morning.  Easily arousable but aphasic.  Objective: Vitals:   12/28/19 0345 12/28/19 0420 12/28/19 0747 12/28/19 1125  BP: 118/83  130/83 116/85  Pulse: 91  90 87  Resp: 20  18 15   Temp: 98.6 F (37 C)  (!) 97.5 F (36.4 C) (!) 97.3 F (36.3 C)  TempSrc: Oral  Axillary Axillary  SpO2: 100%  100% 100%  Weight:  58.2 kg    Height:        Intake/Output Summary (Last 24 hours) at 12/28/2019 1500 Last data filed at 12/28/2019 0445 Gross per 24 hour  Intake 1374 ml  Output 1100 ml  Net 274 ml   Filed Weights   12/25/19 0500 12/26/19 0400 12/28/19 0420  Weight: 60.5 kg 59.5 kg 58.2 kg    Examination:  General exam: Appears calm and comfortable.  Not following any command. Respiratory system: Clear to auscultation. Respiratory effort normal. Cardiovascular system: S1 & S2 heard, RRR. No murmurs, rubs, gallops or clicks. Gastrointestinal system: Abdomen is nondistended, soft and nontender. No organomegaly or masses felt. Normal bowel sounds heard. Central nervous system: Alert. Right sided hemiparesis.  Extremities: No edema. No calf tenderness Skin: No cyanosis. No rashes Psychiatry: Judgement and insight appear impaired. Flat affect  Data Reviewed: I have personally reviewed following labs and imaging studies  CBC: Recent Labs  Lab 12/25/19 1118 12/27/19 0301  WBC 7.7 7.0  HGB 11.8* 11.3*  HCT 37.3* 35.4*  MCV 84.0 82.9  PLT 313 [id number]   Basic Metabolic Panel: Recent Labs  Lab 12/25/19 1118 12/27/19 0301  NA 138 140  K 4.5 4.6  CL 100 101  CO2 31  28  GLUCOSE 99 79  BUN 46* 43*  CREATININE 1.53* 1.38*  CALCIUM 10.2 10.2   GFR: Estimated Creatinine Clearance: 58.6 mL/min (A) (by C-G formula based on SCr of 1.38 mg/dL (H)). Liver Function Tests: Recent Labs  Lab 12/27/19 0301  AST 18  ALT 24  ALKPHOS 75  BILITOT 0.1*  PROT 7.0  ALBUMIN 3.0*   No results for input(s): LIPASE, AMYLASE in the last 168 hours. No results for input(s): AMMONIA in the last 168 hours. Coagulation  Profile: No results for input(s): INR, PROTIME in the last 168 hours. Cardiac Enzymes: No results for input(s): CKTOTAL, CKMB, CKMBINDEX, TROPONINI in the last 168 hours. BNP (last 3 results) No results for input(s): PROBNP in the last 8760 hours. HbA1C: No results for input(s): HGBA1C in the last 72 hours. CBG: Recent Labs  Lab 12/27/19 2019 12/28/19 0012 12/28/19 0344 12/28/19 0746 12/28/19 1127  GLUCAP 106* 143* 96 102* 113*   Lipid Profile: No results for input(s): CHOL, HDL, LDLCALC, TRIG, CHOLHDL, LDLDIRECT in the last 72 hours. Thyroid Function Tests: No results for input(s): TSH, T4TOTAL, FREET4, T3FREE, THYROIDAB in the last 72 hours. Anemia Panel: No results for input(s): VITAMINB12, FOLATE, FERRITIN, TIBC, IRON, RETICCTPCT in the last 72 hours. Sepsis Labs: No results for input(s): PROCALCITON, LATICACIDVEN in the last 168 hours.  No results found for this or any previous visit (from the past 240 hour(s)).    Radiology Studies: No results found.  Scheduled Meds: . amLODipine  10 mg Per Tube Daily  . chlorhexidine  15 mL Mouth Rinse BID  . enoxaparin (LOVENOX) injection  40 mg Subcutaneous Daily  . feeding supplement (OSMOLITE 1.2 CAL)  474 mL Per Tube QID  . feeding supplement (PRO-STAT SUGAR FREE 64)  30 mL Per Tube BID  . folic acid  1 mg Per Tube Daily  . free water  300 mL Per Tube Q4H  . hydrALAZINE  25 mg Per Tube Q8H  . insulin aspart  0-15 Units Subcutaneous Q4H  . levETIRAcetam  500 mg Per Tube BID  . mouth  rinse  15 mL Mouth Rinse q12n4p  . metoprolol tartrate  100 mg Per Tube BID  . multivitamin  15 mL Per Tube Daily  . pantoprazole sodium  40 mg Per Tube BID  . QUEtiapine  50 mg Per Tube QHS  . thiamine  100 mg Per Tube Daily   Continuous Infusions:   LOS: 110 days   Lorella Nimrod, MD     [facility] 12/28/2019, 3:10PM  Pager [phone]  If 7PM-7AM, please contact night-coverage [email or web address] Password [email or web address]  This record has been created using Systems analyst. Errors have been sought and corrected,but may not always be located. Such creation errors do not reflect on the standard of care.

## 2019-12-28 NOTE — Progress Notes (Signed)
Physical Therapy Treatment Patient Details Name: Joshua [name] MRN: [id number] DOB: [birth year] Today's Date: 12/28/2019    History of Present Illness Pt is a 38 y.o. 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 S/p L frontoparietal craniectomy for evacuation of L basal ganglia hematoma 9/12. ETT 9/12-9/18, 9/18-9/23. PEG tube placed 9/28 secondary to dysphagia. PMH includes alcohol abuse, seizures, HTN.    PT Comments    Pt performed transfer training this am and mobilized to edge of bed with moderate assistance.  He is initiating movement to chair by reaching for L arm rest.  Multiple bouts of scooting to achieve optimal positioning. He is following commands with increased time 50% of the time.  Plan remains appropriate for SNF placement.     Follow Up Recommendations  SNF;Supervision/Assistance - 24 hour     Equipment Recommendations  Wheelchair (measurements PT);Wheelchair cushion (measurements PT);Hospital bed;3in1 (PT)    Recommendations for Other Services Rehab consult     Precautions / Restrictions Precautions Precautions: Fall Precaution Comments: L crani, PEG, helmet Required Braces or Orthoses: Splint/Cast Splint/Cast: resting hand splint and elbow splint for R UE due synergistic pattern Restrictions Weight Bearing Restrictions: No    Mobility  Bed Mobility Overal bed mobility: Needs Assistance Bed Mobility: Supine to Sit Rolling: Mod assist         General bed mobility comments: mod assistance to elevate trunk into sitting.  Pt initiating movement to move to edge of bed.  Pt is slow and guarded but utilized L side of body to assist in transfer.  Able to support slef in sitting with intermittent use of L UE.  Transfers Overall transfer level: Needs assistance Equipment used: None Transfers: Squat Pivot Transfers     Squat pivot transfers: Max assist     General transfer comment: Pt able to follow multimodal  cueing to reach for L armrest of chair to assist in transfer.  Once seated in recliner able to assist in scooting to L side of chair, used bed pad to assist patient in scooting to achieve upright position in recliner.  Ambulation/Gait Ambulation/Gait assistance: (NT)               Stairs             Wheelchair Mobility    Modified Rankin (Stroke Patients Only)       Balance     Sitting balance-Leahy Scale: Fair       Standing balance-Leahy Scale: Zero                              Cognition Arousal/Alertness: Awake/alert Behavior During Therapy: Flat affect Overall Cognitive Status: Impaired/Different from baseline Area of Impairment: Attention;Following commands;Safety/judgement;Awareness;Problem solving                   Current Attention Level: Focused Memory: Decreased short-term memory Following Commands: Follows one step commands with increased time Safety/Judgement: Decreased awareness of safety;Decreased awareness of deficits Awareness: Intellectual Problem Solving: Slow processing;Difficulty sequencing;Requires verbal cues;Decreased initiation;Requires tactile cues General Comments: pt makes eye contect each time his name is called. Followed 1 step commands 50% of the time with increased time      Exercises      General Comments        Pertinent Vitals/Pain Pain Assessment: Faces Faces Pain Scale: No hurt Pain Location: RUE with movement Pain Descriptors / Indicators: Guarding Pain Intervention(s): Monitored  during session;Repositioned    Home Living                      Prior Function            PT Goals (current goals can now be found in the care plan section) Acute Rehab PT Goals Patient Stated Goal: none stated Potential to Achieve Goals: Fair Progress towards PT goals: Progressing toward goals    Frequency    Min 2X/week      PT Plan Current plan remains appropriate    Co-evaluation               AM-PAC PT "6 Clicks" Mobility   Outcome Measure  Help needed turning from your back to your side while in a flat bed without using bedrails?: A Lot Help needed moving from lying on your back to sitting on the side of a flat bed without using bedrails?: A Lot Help needed moving to and from a bed to a chair (including a wheelchair)?: Total Help needed standing up from a chair using your arms (e.g., wheelchair or bedside chair)?: Total Help needed to walk in hospital room?: Total Help needed climbing 3-5 steps with a railing? : Total 6 Click Score: 8    End of Session Equipment Utilized During Treatment: Gait belt Activity Tolerance: Patient tolerated treatment well Patient left: in chair;with chair alarm set Nurse Communication: Mobility status PT Visit Diagnosis: Muscle weakness (generalized) (M62.81);Hemiplegia and hemiparesis;Other abnormalities of gait and mobility (R26.89) Hemiplegia - Right/Left: Right Hemiplegia - dominant/non-dominant: Dominant Hemiplegia - caused by: Nontraumatic intracerebral hemorrhage     Time: [id number] PT Time Calculation (min) (ACUTE ONLY): 23 min  Charges:  $Therapeutic Activity: 23-37 mins                     [name] , PTA Acute Rehabilitation Services Pager [phone] Office [phone]     [name] 12/28/2019, 11:43 AM

## 2019-12-29 DIAGNOSIS — I611 Nontraumatic intracerebral hemorrhage in hemisphere, cortical: Secondary | ICD-10-CM | POA: Diagnosis not present

## 2019-12-29 DIAGNOSIS — N184 Chronic kidney disease, stage 4 (severe): Secondary | ICD-10-CM | POA: Diagnosis not present

## 2019-12-29 DIAGNOSIS — I10 Essential (primary) hypertension: Secondary | ICD-10-CM | POA: Diagnosis not present

## 2019-12-29 LAB — GLUCOSE, CAPILLARY
Glucose-Capillary: 105 mg/dL — ABNORMAL HIGH (ref 70–99)
Glucose-Capillary: 117 mg/dL — ABNORMAL HIGH (ref 70–99)
Glucose-Capillary: 124 mg/dL — ABNORMAL HIGH (ref 70–99)
Glucose-Capillary: 126 mg/dL — ABNORMAL HIGH (ref 70–99)
Glucose-Capillary: 149 mg/dL — ABNORMAL HIGH (ref 70–99)
Glucose-Capillary: 155 mg/dL — ABNORMAL HIGH (ref 70–99)
Glucose-Capillary: 87 mg/dL (ref 70–99)
Glucose-Capillary: 91 mg/dL (ref 70–99)

## 2019-12-29 NOTE — Progress Notes (Signed)
PROGRESS NOTE    Joshua [name]  [id number] DOB: [birth year] DOA: 09/09/2019 PCP: Patient, [name]   Brief Narrative: Joshua [name] is a 39 y.o. male admitted by neurology on 9/12 with seizure, right hemiplegia, decreased responsiveness found to have left hemispheric intracranial hemorrhage, subsequently had a rapidly declining neurological status, intubated and admitted to ICU, underwent left craniotomy for decompression and evacuation of ICH on 9/12 by Dr. Vertell Limber.Continues to have slow neurological recovery, prolonged hospitalization treated for seizures and aspiration pneumonia.Continues to be awake and alert, but aphasic. PEG tube placement on 9/28.Disposition continues to be challenging, uninsured, needs long-term placement.   Assessment & Plan:   Principal Problem:   ICH (intracerebral hemorrhage) (HCC) Active Problems:   Subdural hematoma (HCC)   Intracerebral hemorrhage (HCC)   Cytotoxic brain edema (HCC)   Hemorrhagic stroke (HCC)   Acute respiratory failure ([place])   Essential hypertension   Severe protein-calorie malnutrition (HCC)   CKD (chronic kidney disease), stage IV (HCC)   Seizure ([place])   AKI (acute kidney injury) (Joshua [name])   Left intracranial hemorrhage Patient underwentdecompressive craniectomy and hematoma evacuation withimplantation of skull flap in the right abdomenby Dr. Vertell Limber in 9/12.Felt to be secondary to uncontrolled, untreated hypertension, CTA was negative for aneurysm or vascular malformation.Remains aphasic/nonverbal with dense right hemiplegia, cognitive impairment, inconsistently follows commands. Previous hospitalist discussed with Dr. Vertell Limber 12/2 regarding flap replacement; given his bedridden status and lack of progress since surgery, no indication for replacement of his skull at this time as would only be a cosmetic fix. Currently a difficult placement.  Seizures Secondary to above -Continue Keppra 500 mg BID  Aspiration  pneumonia Dysphagia Completed antibiotics. PEG tube placed.  Klebsiella oxytoca UTI Treated with Cefazolin  Essential hypertension Stable -Continue amlodipine    DVT prophylaxis: Lovenox Code Status:   Code Status: Full Code Family Communication: None Disposition Plan: Discharge pending safe discharge plan   Consultants:   Neurosurgery  Neurology  GI  Procedures:   9/19: Transthoracic Echocardiogram IMPRESSIONS    1. Left ventricular ejection fraction, by visual estimation, is 70 to 75%. The left ventricle has hyperdynamic function. Normal left ventricular size. There is severely increased left ventricular hypertrophy.  2. Global right ventricle has normal systolic function.The right ventricular size is normal. No increase in right ventricular wall thickness.  3. Left atrial size was normal.  4. Right atrial size was moderately dilated.  5. The mitral valve is grossly normal. Mild mitral valve regurgitation.  6. The tricuspid valve is grossly normal. Tricuspid valve regurgitation is trivial.  7. The aortic valve is tricuspid Aortic valve regurgitation was not visualized by color flow Doppler.  8. The pulmonic valve was grossly normal. Pulmonic valve regurgitation is not visualized by color flow Doppler.  9. The inferior vena cava was not able to be adequately assessed to estimate CVP.   10/1: EGD Findings:      No gross lesions were noted in the esophagus.      No gross lesions were noted in the stomach. Placement of an externally       removable PEG with no T-fasteners was successfully completed. The       external bumper was at the 3.0 cm marking on the tube. Estimated blood       loss was minimal.      No gross lesions were noted in the second portion of the duodenum. Impression:               - No  gross lesions in esophagus.                           - No gross lesions in the stomach.                           - No gross lesions in the second portion of the                             duodenum.                           - An externally removable PEG placement was                            successfully completed.                           - No specimens collected. Recommendation:           may use for Meds now, tube feeds in 4h   Antimicrobials:      Subjective: No issues reported overnight  Objective: Vitals:   12/29/19 0414 12/29/19 0536 12/29/19 0734 12/29/19 1147  BP:  106/74 115/84 110/76  Pulse:   92 95  Resp:   16 16  Temp:   98 F (36.7 C) 98.2 F (36.8 C)  TempSrc:   Oral Oral  SpO2:   100% 99%  Weight: 58 kg     Height:        Intake/Output Summary (Last 24 hours) at 12/29/2019 1231 Last data filed at 12/29/2019 0404 Gross per 24 hour  Intake --  Output 3500 ml  Net -3500 ml   Filed Weights   12/26/19 0400 12/28/19 0420 12/29/19 0414  Weight: 59.5 kg 58.2 kg 58 kg    Examination:  General: Well appearing, no distress    Data Reviewed: I have personally reviewed following labs and imaging studies  CBC: Recent Labs  Lab 12/25/19 1118 12/27/19 0301  WBC 7.7 7.0  HGB 11.8* 11.3*  HCT 37.3* 35.4*  MCV 84.0 82.9  PLT 313 [id number]   Basic Metabolic Panel: Recent Labs  Lab 12/25/19 1118 12/27/19 0301  NA 138 140  K 4.5 4.6  CL 100 101  CO2 31 28  GLUCOSE 99 79  BUN 46* 43*  CREATININE 1.53* 1.38*  CALCIUM 10.2 10.2   GFR: Estimated Creatinine Clearance: 58.4 mL/min (A) (by C-G formula based on SCr of 1.38 mg/dL (H)). Liver Function Tests: Recent Labs  Lab 12/27/19 0301  AST 18  ALT 24  ALKPHOS 75  BILITOT 0.1*  PROT 7.0  ALBUMIN 3.0*   No results for input(s): LIPASE, AMYLASE in the last 168 hours. No results for input(s): AMMONIA in the last 168 hours. Coagulation Profile: No results for input(s): INR, PROTIME in the last 168 hours. Cardiac Enzymes: No results for input(s): CKTOTAL, CKMB, CKMBINDEX, TROPONINI in the last 168 hours. BNP (last 3 results) No results for input(s):  PROBNP in the last 8760 hours. HbA1C: No results for input(s): HGBA1C in the last 72 hours. CBG: Recent Labs  Lab 12/28/19 2359 12/29/19 0306 12/29/19 0727 12/29/19 1142 12/29/19 1150  GLUCAP 155* 91 105* 117* 124*   Lipid Profile: No results for input(s): CHOL,  HDL, LDLCALC, TRIG, CHOLHDL, LDLDIRECT in the last 72 hours. Thyroid Function Tests: No results for input(s): TSH, T4TOTAL, FREET4, T3FREE, THYROIDAB in the last 72 hours. Anemia Panel: No results for input(s): VITAMINB12, FOLATE, FERRITIN, TIBC, IRON, RETICCTPCT in the last 72 hours. Sepsis Labs: No results for input(s): PROCALCITON, LATICACIDVEN in the last 168 hours.  No results found for this or any previous visit (from the past 240 hour(s)).       Radiology Studies: No results found.      Scheduled Meds: . amLODipine  10 mg Per Tube Daily  . chlorhexidine  15 mL Mouth Rinse BID  . enoxaparin (LOVENOX) injection  40 mg Subcutaneous Daily  . feeding supplement (OSMOLITE 1.2 CAL)  474 mL Per Tube QID  . feeding supplement (PRO-STAT SUGAR FREE 64)  30 mL Per Tube BID  . folic acid  1 mg Per Tube Daily  . free water  300 mL Per Tube Q4H  . hydrALAZINE  25 mg Per Tube Q8H  . insulin aspart  0-15 Units Subcutaneous Q4H  . levETIRAcetam  500 mg Per Tube BID  . mouth rinse  15 mL Mouth Rinse q12n4p  . metoprolol tartrate  100 mg Per Tube BID  . multivitamin  15 mL Per Tube Daily  . pantoprazole sodium  40 mg Per Tube BID  . QUEtiapine  50 mg Per Tube QHS  . thiamine  100 mg Per Tube Daily   Continuous Infusions:   LOS: 111 days     Cordelia Poche, MD [facility] 12/29/2019, 12:31 PM  If 7PM-7AM, please contact night-coverage [email or web address]

## 2019-12-30 DIAGNOSIS — E43 Unspecified severe protein-calorie malnutrition: Secondary | ICD-10-CM

## 2019-12-30 DIAGNOSIS — N184 Chronic kidney disease, stage 4 (severe): Secondary | ICD-10-CM | POA: Diagnosis not present

## 2019-12-30 DIAGNOSIS — I619 Nontraumatic intracerebral hemorrhage, unspecified: Secondary | ICD-10-CM

## 2019-12-30 DIAGNOSIS — I611 Nontraumatic intracerebral hemorrhage in hemisphere, cortical: Secondary | ICD-10-CM | POA: Diagnosis not present

## 2019-12-30 DIAGNOSIS — I10 Essential (primary) hypertension: Secondary | ICD-10-CM | POA: Diagnosis not present

## 2019-12-30 LAB — GLUCOSE, CAPILLARY
Glucose-Capillary: 80 mg/dL (ref 70–99)
Glucose-Capillary: 104 mg/dL — ABNORMAL HIGH (ref 70–99)
Glucose-Capillary: 86 mg/dL (ref 70–99)
Glucose-Capillary: 97 mg/dL (ref 70–99)
Glucose-Capillary: 127 mg/dL — ABNORMAL HIGH (ref 70–99)
Glucose-Capillary: 130 mg/dL — ABNORMAL HIGH (ref 70–99)

## 2019-12-30 NOTE — Progress Notes (Signed)
Patient refused all oral nutrition offered at breakfast, will continue to offer. Patient refusing PEG tube medications at this time. Puts hand over PEG tube opening and pushes me away. Educated patient on importance of medications.

## 2019-12-30 NOTE — Progress Notes (Signed)
PROGRESS NOTE    Joshua [name]  [id number] DOB: 01-23-1981 DOA: 09/09/2019 PCP: Patient, [name]   Brief Narrative: Joshua [name] is a 39 y.o. male admitted by neurology on 9/12 with seizure, right hemiplegia, decreased responsiveness found to have left hemispheric intracranial hemorrhage, subsequently had a rapidly declining neurological status, intubated and admitted to ICU, underwent left craniotomy for decompression and evacuation of ICH on 9/12 by Dr. Vertell Limber.Continues to have slow neurological recovery, prolonged hospitalization treated for seizures and aspiration pneumonia.Continues to be awake and alert, but aphasic. PEG tube placement on 9/28.Disposition continues to be challenging, uninsured, needs long-term placement.   Assessment & Plan:   Principal Problem:   ICH (intracerebral hemorrhage) (HCC) Active Problems:   Subdural hematoma (HCC)   Intracerebral hemorrhage (HCC)   Cytotoxic brain edema (HCC)   Hemorrhagic stroke (HCC)   Acute respiratory failure ([place])   Essential hypertension   Severe protein-calorie malnutrition (HCC)   CKD (chronic kidney disease), stage IV (HCC)   Seizure ([place])   AKI (acute kidney injury) ([place])   Left intracranial hemorrhage Patient underwentdecompressive craniectomy and hematoma evacuation withimplantation of skull flap in the right abdomenby Dr. Vertell Limber in 9/12.Felt to be secondary to uncontrolled, untreated hypertension, CTA was negative for aneurysm or vascular malformation.Remains aphasic/nonverbal with dense right hemiplegia, cognitive impairment, inconsistently follows commands. Previous hospitalist discussed with Dr. Vertell Limber 12/2 regarding flap replacement; given his bedridden status and lack of progress since surgery, no indication for replacement of his skull at this time as would only be a cosmetic fix. Currently a difficult placement.  Seizures Secondary to above -Continue Keppra 500 mg BID  Aspiration pneumonia  Dysphagia Completed antibiotics. PEG tube placed.  Klebsiella oxytoca UTI Treated with Cefazolin. Resolved.  Essential hypertension Stable -Continue amlodipine    DVT prophylaxis: Lovenox Code Status:   Code Status: Full Code Family Communication: None Disposition Plan: Discharge pending safe discharge plan   Consultants:   Neurosurgery  Neurology  GI  Procedures:   9/19: Transthoracic Echocardiogram IMPRESSIONS    1. Left ventricular ejection fraction, by visual estimation, is 70 to 75%. The left ventricle has hyperdynamic function. Normal left ventricular size. There is severely increased left ventricular hypertrophy.  2. Global right ventricle has normal systolic function.The right ventricular size is normal. No increase in right ventricular wall thickness.  3. Left atrial size was normal.  4. Right atrial size was moderately dilated.  5. The mitral valve is grossly normal. Mild mitral valve regurgitation.  6. The tricuspid valve is grossly normal. Tricuspid valve regurgitation is trivial.  7. The aortic valve is tricuspid Aortic valve regurgitation was not visualized by color flow Doppler.  8. The pulmonic valve was grossly normal. Pulmonic valve regurgitation is not visualized by color flow Doppler.  9. The inferior vena cava was not able to be adequately assessed to estimate CVP.   10/1: EGD Findings:      No gross lesions were noted in the esophagus.      No gross lesions were noted in the stomach. Placement of an externally       removable PEG with no T-fasteners was successfully completed. The       external bumper was at the 3.0 cm marking on the tube. Estimated blood       loss was minimal.      No gross lesions were noted in the second portion of the duodenum. Impression:               -  No gross lesions in esophagus.                           - No gross lesions in the stomach.                           - No gross lesions in the second portion of the                             duodenum.                           - An externally removable PEG placement was                            successfully completed.                           - No specimens collected. Recommendation:           may use for Meds now, tube feeds in 4h   Antimicrobials:      Subjective: Aphasia  Objective: Vitals:   12/29/19 2002 12/29/19 2353 12/30/19 0351 12/30/19 0732  BP: 123/67 104/71 121/79 123/74  Pulse: 94 94 88 93  Resp: 20  19 18   Temp: 98.8 F (37.1 C) 98.5 F (36.9 C) 98.7 F (37.1 C) 98.5 F (36.9 C)  TempSrc: Oral Oral Oral Oral  SpO2: 100% 98% 100% 99%  Weight:      Height:        Intake/Output Summary (Last 24 hours) at 12/30/2019 1150 Last data filed at 12/30/2019 0700 Gross per 24 hour  Intake 474 ml  Output 2525 ml  Net -2051 ml   Filed Weights   12/26/19 0400 12/28/19 0420 12/29/19 0414  Weight: 59.5 kg 58.2 kg 58 kg    Examination:  General exam: Appears calm and comfortable Respiratory system: Clear to auscultation. Respiratory effort normal. Cardiovascular system: S1 & S2 heard, RRR. No murmurs, rubs, gallops or clicks. Gastrointestinal system: Abdomen is nondistended, soft and nontender. No organomegaly or masses felt. Normal bowel sounds heard. Central nervous system: Alert. Right sided hemiplegia. Not following commands (appears to be secondary to desire) Extremities: No edema. No calf tenderness Skin: No cyanosis. No rashes    Data Reviewed: I have personally reviewed following labs and imaging studies  CBC: Recent Labs  Lab 12/25/19 1118 12/27/19 0301  WBC 7.7 7.0  HGB 11.8* 11.3*  HCT 37.3* 35.4*  MCV 84.0 82.9  PLT 313 Q000111Q   Basic Metabolic Panel: Recent Labs  Lab 12/25/19 1118 12/27/19 0301  NA 138 140  K 4.5 4.6  CL 100 101  CO2 31 28  GLUCOSE 99 79  BUN 46* 43*  CREATININE 1.53* 1.38*  CALCIUM 10.2 10.2   GFR: Estimated Creatinine Clearance: 58.4 mL/min (A) (by C-G formula based on  SCr of 1.38 mg/dL (H)). Liver Function Tests: Recent Labs  Lab 12/27/19 0301  AST 18  ALT 24  ALKPHOS 75  BILITOT 0.1*  PROT 7.0  ALBUMIN 3.0*   No results for input(s): LIPASE, AMYLASE in the last 168 hours. No results for input(s): AMMONIA in the last 168 hours. Coagulation Profile: No results for input(s): INR, PROTIME in the last 168 hours. Cardiac  Enzymes: No results for input(s): CKTOTAL, CKMB, CKMBINDEX, TROPONINI in the last 168 hours. BNP (last 3 results) No results for input(s): PROBNP in the last 8760 hours. HbA1C: No results for input(s): HGBA1C in the last 72 hours. CBG: Recent Labs  Lab 12/29/19 2011 12/29/19 2352 12/30/19 0353 12/30/19 0728 12/30/19 1118  GLUCAP 87 149* 80 104* 86   Lipid Profile: No results for input(s): CHOL, HDL, LDLCALC, TRIG, CHOLHDL, LDLDIRECT in the last 72 hours. Thyroid Function Tests: No results for input(s): TSH, T4TOTAL, FREET4, T3FREE, THYROIDAB in the last 72 hours. Anemia Panel: No results for input(s): VITAMINB12, FOLATE, FERRITIN, TIBC, IRON, RETICCTPCT in the last 72 hours. Sepsis Labs: No results for input(s): PROCALCITON, LATICACIDVEN in the last 168 hours.  No results found for this or any previous visit (from the past 240 hour(s)).       Radiology Studies: No results found.      Scheduled Meds: . amLODipine  10 mg Per Tube Daily  . chlorhexidine  15 mL Mouth Rinse BID  . enoxaparin (LOVENOX) injection  40 mg Subcutaneous Daily  . feeding supplement (OSMOLITE 1.2 CAL)  474 mL Per Tube QID  . feeding supplement (PRO-STAT SUGAR FREE 64)  30 mL Per Tube BID  . folic acid  1 mg Per Tube Daily  . free water  300 mL Per Tube Q4H  . hydrALAZINE  25 mg Per Tube Q8H  . insulin aspart  0-15 Units Subcutaneous Q4H  . levETIRAcetam  500 mg Per Tube BID  . mouth rinse  15 mL Mouth Rinse q12n4p  . metoprolol tartrate  100 mg Per Tube BID  . multivitamin  15 mL Per Tube Daily  . pantoprazole sodium  40 mg Per  Tube BID  . QUEtiapine  50 mg Per Tube QHS  . thiamine  100 mg Per Tube Daily   Continuous Infusions:   LOS: 112 days     Cordelia Poche, MD [facility] 12/30/2019, 11:50 AM  If 7PM-7AM, please contact night-coverage [email or web address]

## 2019-12-31 DIAGNOSIS — I611 Nontraumatic intracerebral hemorrhage in hemisphere, cortical: Secondary | ICD-10-CM

## 2019-12-31 LAB — GLUCOSE, CAPILLARY
Glucose-Capillary: 96 mg/dL (ref 70–99)
Glucose-Capillary: 96 mg/dL (ref 70–99)
Glucose-Capillary: 156 mg/dL — ABNORMAL HIGH (ref 70–99)
Glucose-Capillary: 83 mg/dL (ref 70–99)

## 2019-12-31 NOTE — Progress Notes (Signed)
PROGRESS NOTE    Joshua [name]  [id number] DOB: 1981-09-09 DOA: 09/09/2019 PCP: Patient, [name]     Brief Narrative:  Joshua [name] is a 39 y.o. male admitted by neurology on 9/12 with seizure, right hemiplegia, decreased responsiveness found to have left hemispheric intracranial hemorrhage, subsequently had a rapidly declining neurological status, intubated and admitted to ICU, underwent left craniotomy for decompression and evacuation of ICH on 9/12 by Dr. Vertell Limber.Continues to have slow neurological recovery, prolonged hospitalization treated for seizures and aspiration pneumonia.Continues to be awake and alert, but aphasic. PEG tube placement on 9/28.Disposition continues to be challenging, uninsured, needs long-term placement.  New events last 24 hours / Subjective: Has been intermittently declining PEG tube feeding, medication administration, PO intake. No new events.   Assessment & Plan:   Principal Problem:   ICH (intracerebral hemorrhage) (HCC) Active Problems:   Subdural hematoma (HCC)   Intracerebral hemorrhage (HCC)   Cytotoxic brain edema (HCC)   Hemorrhagic stroke (HCC)   Acute respiratory failure ([place])   Essential hypertension   Severe protein-calorie malnutrition (HCC)   CKD (chronic kidney disease), stage IV (HCC)   Seizure ([place])   AKI (acute kidney injury) ([place])   Left intracranial hemorrhage Patient underwentdecompressive craniectomy and hematoma evacuation withimplantation of skull flap in the right abdomenby Dr. Vertell Limber in 9/12.Felt to be secondary to uncontrolled, untreated hypertension, CTA was negative for aneurysm or vascular malformation.Remains aphasic/nonverbal with dense right hemiplegia, cognitive impairment, inconsistently follows commands. Per Dr. Vertell Limber, given patient's bedridden status and lack of progress since surgery, no indication for replacement of his skull at this time as would only be a cosmetic fix. Currently a difficult  placement.  Seizures Secondary to above -Continue Keppra 500 mg BID  Aspiration pneumonia Dysphagia Completed antibiotics. PEG tube placed.  Klebsiella oxytoca UTI Treated with Cefazolin  Essential hypertension Stable -Continue amlodipine, hydralazine, lopressor    DVT prophylaxis: Lovenox Code Status: Full Family Communication: None at bedside Disposition Plan: Pending SNF placement    Antimicrobials:  Anti-infectives (From admission, onward)   Start     Dose/Rate Route Frequency Ordered Stop   09/27/19 1130  cephALEXin (KEFLEX) 250 MG/5ML suspension 500 mg     500 mg Per Tube Every 12 hours 09/27/19 1052 09/29/19 2250   09/25/19 1445  ceFAZolin (ANCEF) IVPB 2g/100 mL premix  Status:  Discontinued     2 g 200 mL/hr over 30 Minutes Intravenous Every 12 hours 09/25/19 1417 09/27/19 1052   09/14/19 1000  Ampicillin-Sulbactam (UNASYN) 3 g in sodium chloride 0.9 % 100 mL IVPB     3 g 200 mL/hr over 30 Minutes Intravenous Every 8 hours 09/14/19 0922 09/17/19 1816   09/13/19 1100  vancomycin (VANCOCIN) IVPB 750 mg/150 ml premix  Status:  Discontinued     750 mg 150 mL/hr over 60 Minutes Intravenous Every 24 hours 09/13/19 0728 09/13/19 0838   09/12/19 1100  vancomycin (VANCOCIN) IVPB 1000 mg/200 mL premix  Status:  Discontinued     1,000 mg 200 mL/hr over 60 Minutes Intravenous Every 24 hours 09/11/19 1034 09/13/19 0728   09/11/19 1130  piperacillin-tazobactam (ZOSYN) IVPB 3.375 g  Status:  Discontinued     3.375 g 12.5 mL/hr over 240 Minutes Intravenous Every 8 hours 09/11/19 1054 09/14/19 0845   09/11/19 1100  vancomycin (VANCOCIN) 1,500 mg in sodium chloride 0.9 % 500 mL IVPB     1,500 mg 250 mL/hr over 120 Minutes Intravenous  Once 09/11/19 1034 09/11/19 1900   09/11/19  1015  piperacillin-tazobactam (ZOSYN) IVPB 4.5 g  Status:  Discontinued     4.5 g 200 mL/hr over 30 Minutes Intravenous Every 8 hours 09/11/19 1010 09/11/19 1052   09/09/19 0845  ceFAZolin (ANCEF)  IVPB 2g/100 mL premix     2 g 200 mL/hr over 30 Minutes Intravenous Every 8 hours 09/09/19 0842 09/09/19 1658        Objective: Vitals:   12/30/19 2328 12/31/19 0015 12/31/19 0402 12/31/19 0922  BP: 111/77 121/76 111/82 (!) 138/93  Pulse: 94 95 87 90  Resp: 16 20 18 18   Temp: 98.2 F (36.8 C) 98.6 F (37 C) 98.2 F (36.8 C) 98.1 F (36.7 C)  TempSrc: Oral Oral Oral Oral  SpO2: 100% 100% 99% 100%  Weight:      Height:        Intake/Output Summary (Last 24 hours) at 12/31/2019 1048 Last data filed at 12/31/2019 0900 Gross per 24 hour  Intake 534 ml  Output 350 ml  Net 184 ml   Filed Weights   12/26/19 0400 12/28/19 0420 12/29/19 0414  Weight: 59.5 kg 58.2 kg 58 kg    Examination:  General exam: Appears calm and comfortable  Central nervous system: Alert, remains aphasic    Data Reviewed: I have personally reviewed following labs and imaging studies  CBC: Recent Labs  Lab 12/25/19 1118 12/27/19 0301  WBC 7.7 7.0  HGB 11.8* 11.3*  HCT 37.3* 35.4*  MCV 84.0 82.9  PLT 313 [id number]   Basic Metabolic Panel: Recent Labs  Lab 12/25/19 1118 12/27/19 0301  NA 138 140  K 4.5 4.6  CL 100 101  CO2 31 28  GLUCOSE 99 79  BUN 46* 43*  CREATININE 1.53* 1.38*  CALCIUM 10.2 10.2   GFR: Estimated Creatinine Clearance: 58.4 mL/min (A) (by C-G formula based on SCr of 1.38 mg/dL (H)). Liver Function Tests: Recent Labs  Lab 12/27/19 0301  AST 18  ALT 24  ALKPHOS 75  BILITOT 0.1*  PROT 7.0  ALBUMIN 3.0*   No results for input(s): LIPASE, AMYLASE in the last 168 hours. No results for input(s): AMMONIA in the last 168 hours. Coagulation Profile: No results for input(s): INR, PROTIME in the last 168 hours. Cardiac Enzymes: No results for input(s): CKTOTAL, CKMB, CKMBINDEX, TROPONINI in the last 168 hours. BNP (last 3 results) No results for input(s): PROBNP in the last 8760 hours. HbA1C: No results for input(s): HGBA1C in the last 72 hours. CBG: Recent Labs  Lab  12/30/19 1642 12/30/19 1919 12/30/19 2324 12/31/19 0359 12/31/19 0720  GLUCAP 97 127* 130* 96 96   Lipid Profile: No results for input(s): CHOL, HDL, LDLCALC, TRIG, CHOLHDL, LDLDIRECT in the last 72 hours. Thyroid Function Tests: No results for input(s): TSH, T4TOTAL, FREET4, T3FREE, THYROIDAB in the last 72 hours. Anemia Panel: No results for input(s): VITAMINB12, FOLATE, FERRITIN, TIBC, IRON, RETICCTPCT in the last 72 hours. Sepsis Labs: No results for input(s): PROCALCITON, LATICACIDVEN in the last 168 hours.  No results found for this or any previous visit (from the past 240 hour(s)).    Radiology Studies: No results found.    Scheduled Meds: . amLODipine  10 mg Per Tube Daily  . chlorhexidine  15 mL Mouth Rinse BID  . enoxaparin (LOVENOX) injection  40 mg Subcutaneous Daily  . feeding supplement (OSMOLITE 1.2 CAL)  474 mL Per Tube QID  . feeding supplement (PRO-STAT SUGAR FREE 64)  30 mL Per Tube BID  . folic acid  1  mg Per Tube Daily  . free water  300 mL Per Tube Q4H  . hydrALAZINE  25 mg Per Tube Q8H  . insulin aspart  0-15 Units Subcutaneous Q4H  . levETIRAcetam  500 mg Per Tube BID  . mouth rinse  15 mL Mouth Rinse q12n4p  . metoprolol tartrate  100 mg Per Tube BID  . multivitamin  15 mL Per Tube Daily  . pantoprazole sodium  40 mg Per Tube BID  . QUEtiapine  50 mg Per Tube QHS  . thiamine  100 mg Per Tube Daily   Continuous Infusions:   LOS: 113 days      Time spent: 20 minutes   Dessa Phi, DO [facility] 12/31/2019, 10:48 AM   Available via Epic secure chat 7am-7pm After these hours, please refer to coverage provider listed on [email or web address]

## 2019-12-31 NOTE — Progress Notes (Signed)
Tried to feed patient breakfast.Try to use a syringe to provide him with liquids. Took one sip of OJ and one sip of coke then refused to eat anything with encouragement.

## 2020-01-01 DIAGNOSIS — I611 Nontraumatic intracerebral hemorrhage in hemisphere, cortical: Secondary | ICD-10-CM | POA: Diagnosis not present

## 2020-01-01 LAB — GLUCOSE, CAPILLARY
Glucose-Capillary: 103 mg/dL — ABNORMAL HIGH (ref 70–99)
Glucose-Capillary: 120 mg/dL — ABNORMAL HIGH (ref 70–99)
Glucose-Capillary: 171 mg/dL — ABNORMAL HIGH (ref 70–99)
Glucose-Capillary: 191 mg/dL — ABNORMAL HIGH (ref 70–99)
Glucose-Capillary: 59 mg/dL — ABNORMAL LOW (ref 70–99)
Glucose-Capillary: 83 mg/dL (ref 70–99)
Glucose-Capillary: 87 mg/dL (ref 70–99)
Glucose-Capillary: 93 mg/dL (ref 70–99)
Glucose-Capillary: 95 mg/dL (ref 70–99)

## 2020-01-01 NOTE — Progress Notes (Addendum)
Occupational Therapy Treatment Patient Details Name: Joshua [name] MRN: [id number] DOB: December 04, 1981 Today's Date: 01/01/2020    History of present illness Pt is a 39 y.o. 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 S/p L frontoparietal craniectomy for evacuation of L basal ganglia hematoma 9/12. ETT 9/12-9/18, 9/18-9/23. PEG tube placed 9/28 secondary to dysphagia. PMH includes alcohol abuse, seizures, HTN.   OT comments  Pt making gradual progress towards OT goals. Pt tolerating OOB to recliner with +2 maxA for sit<>stand and stand pivot transfers. Pt completing seated grooming ADL overall with modA and max multimodal cues for initiating and carrying out ADL task. Pt intermittently following simple commands given increased time and max cues. Acute OT goals and goal date updated to reflect pt progress. Will continue per POC at this time.   Follow Up Recommendations  SNF;Supervision/Assistance - 24 hour    Equipment Recommendations  None recommended by OT          Precautions / Restrictions Precautions Precautions: Fall Precaution Comments: L crani, PEG, helmet Restrictions Weight Bearing Restrictions: No       Mobility Bed Mobility Overal bed mobility: Needs Assistance Bed Mobility: Supine to Sit     Supine to sit: Max assist;+2 for safety/equipment     General bed mobility comments: max multimodal cues for hooking RLE to assist with bringing LLE over EOB - pt able to initiate but requires cues/assist to perform, assist for trunk elevation  Transfers Overall transfer level: Needs assistance Equipment used: 2 person hand held assist Transfers: Sit to/from Stand;Stand Pivot Transfers Sit to Stand: +2 physical assistance;Max assist Stand pivot transfers: Max assist;+2 physical assistance;+2 safety/equipment       General transfer comment: boosting and assist for stabilizing once upright, pt with RUE flexor synergy in both RUE  and RLE; with max facilitation for LE movements and +2 assist pt able to take few steps to recliner. completed additional sit<>stand from recliner to sink     Balance Overall balance assessment: Needs assistance Sitting-balance support: Feet supported;Single extremity supported Sitting balance-Leahy Scale: Fair Sitting balance - Comments: requires assist initially due to R lateral/posterior lean, once stabilized pt able to maintain for brief periods of time with close minguard assist                                    ADL either performed or assessed with clinical judgement   ADL Overall ADL's : Needs assistance/impaired     Grooming: Moderate assistance;Sitting;Oral care Grooming Details (indicate cue type and reason): pt initiates motion of bringing hand to mouth to brush teeth however decreased follow through, requires cues and multimodal cues to do so, increased time to accurately perform. use of yonker to suction post oral care                              Functional mobility during ADLs: Moderate assistance;Maximal assistance;+2 for physical assistance;+2 for safety/equipment General ADL Comments: pt tolerating OOB to recliner, stood at sink in room however requires signiticant assist for standing balance so transitioned to sitting for seated grooming ADL     Vision       Perception     Praxis      Cognition Arousal/Alertness: Awake/alert Behavior During Therapy: Flat affect Overall Cognitive Status: Impaired/Different from baseline Area of Impairment: Attention;Following commands;Safety/judgement;Awareness;Problem  solving                   Current Attention Level: Focused Memory: Decreased short-term memory Following Commands: Follows one step commands with increased time;Follows one step commands inconsistently Safety/Judgement: Decreased awareness of safety;Decreased awareness of deficits Awareness: Intellectual Problem Solving:  Slow processing;Difficulty sequencing;Requires verbal cues;Decreased initiation;Requires tactile cues General Comments: following 1 step commands approx 50% of the time given multimodal cues to do so, pt attempting to get out of recliner end of session without assist         Exercises     Shoulder Instructions       General Comments      Pertinent Vitals/ Pain       Pain Assessment: Faces Faces Pain Scale: No hurt Pain Intervention(s): Limited activity within patient's tolerance;Monitored during session;Repositioned  Home Living                                          Prior Functioning/Environment              Frequency  Min 2X/week        Progress Toward Goals  OT Goals(current goals can now be found in the care plan section)  Progress towards OT goals: Progressing toward goals  Acute Rehab OT Goals Patient Stated Goal: none stated OT Goal Formulation: Patient unable to participate in goal setting Time For Goal Achievement: 01/15/20 Potential to Achieve Goals: Good ADL Goals Pt Will Perform Grooming: sitting;with mod assist Pt Will Transfer to Toilet: with mod assist;stand pivot transfer Pt/caregiver will Perform Home Exercise Program: Increased ROM;Increased strength;Right Upper extremity;With minimal assist Additional ADL Goal #1: Pt will maintain static sitting balance EOB with minguard to increase independence with ADL task. Additional ADL Goal #2: Pt will reach using RUE and LUE for items outside base of support while sitting with minA in prepartation for ADL completion. Additional ADL Goal #3: Pt will follow one step commands 25% of the time during functional task.   Plan Discharge plan remains appropriate    Co-evaluation    PT/OT/SLP Co-Evaluation/Treatment: Yes Reason for Co-Treatment: Complexity of the patient's impairments (multi-system involvement);For patient/therapist safety;To address functional/ADL transfers   OT goals  addressed during session: ADL's and self-care      AM-PAC OT "6 Clicks" Daily Activity     Outcome Measure   Help from another person eating meals?: A Lot Help from another person taking care of personal grooming?: A Lot Help from another person toileting, which includes using toliet, bedpan, or urinal?: A Lot Help from another person bathing (including washing, rinsing, drying)?: Total Help from another person to put on and taking off regular upper body clothing?: Total Help from another person to put on and taking off regular lower body clothing?: Total 6 Click Score: 9    End of Session Equipment Utilized During Treatment: Gait belt  OT Visit Diagnosis: Other abnormalities of gait and mobility (R26.89);Hemiplegia and hemiparesis;Cognitive communication deficit (R41.841) Symptoms and signs involving cognitive functions: Other Nontraumatic ICH Hemiplegia - Right/Left: Right Hemiplegia - caused by: Other Nontraumatic intracranial hemorrhage   Activity Tolerance Patient tolerated treatment well   Patient Left in chair;with call bell/phone within reach;with chair alarm set(seatbelt chair alarm)   Nurse Communication Mobility status        Time: [id number] OT Time Calculation (min): 28 min  Charges: OT General Charges $OT  Visit: 1 Visit OT Treatments $Self Care/Home Management : 8-22 mins  Lou Cal, OT Supplemental Rehabilitation Services Pager [phone] Office [phone]   Raymondo Band 01/01/2020, 4:25 PM

## 2020-01-01 NOTE — TOC Progression Note (Signed)
Transition of Care [facility]) - Progression Note    Patient Details  Name: Joshua [name] MRN: [id number] Date of Birth: [birth year]  Transition of Care [facility]) CM/SW [place], [place] Phone Number: 01/01/2020, 10:24 AM  Clinical Narrative:   CSW continuing to follow for SNF placement. Patient has no bed offers due to no payor source. CSW to follow.    Expected Discharge Plan: Skilled Nursing Facility Barriers to Discharge: SNF Pending payor source - [employer], SNF Pending bed offer, Inadequate or no insurance  Expected Discharge Plan and Services Expected Discharge Plan: [place] In-house Referral: Clinical Social Work Discharge Planning Services: NA Post Acute Care Choice: [place] Living arrangements for the past 2 months: Apartment                 DME Arranged: N/A DME Agency: NA       HH Arranged: NA HH Agency: NA         Social Determinants of Health (SDOH) Interventions    Readmission Risk Interventions No flowsheet data found.

## 2020-01-01 NOTE — Progress Notes (Addendum)
Physical Therapy Treatment Patient Details Name: Joshua [name] MRN: [id number] DOB: October 30, 1981 Today's Date: 01/01/2020    History of Present Illness Pt is a 39 y.o. 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 S/p L frontoparietal craniectomy for evacuation of L basal ganglia hematoma 9/12. ETT 9/12-9/18, 9/18-9/23. PEG tube placed 9/28 secondary to dysphagia. PMH includes alcohol abuse, seizures, HTN.    PT Comments    Pt performed gt training progression limited to only a few steps from bed to recliner.  Pt required assistance (2 person hand held assistance) and performed shuffling steps from bed to recliner.  He continues to be appropriate for SNF placement at this time.     Follow Up Recommendations  SNF;Supervision/Assistance - 24 hour     Equipment Recommendations  Wheelchair (measurements PT);Wheelchair cushion (measurements PT);Hospital bed;3in1 (PT)    Recommendations for Other Services Rehab consult     Precautions / Restrictions Precautions Precautions: Fall Precaution Comments: L crani, PEG, helmet Required Braces or Orthoses: Splint/Cast Splint/Cast: resting hand splint and elbow splint for R UE due synergistic pattern Restrictions Weight Bearing Restrictions: No    Mobility  Bed Mobility Overal bed mobility: Needs Assistance Bed Mobility: Supine to Sit;Sit to Supine Rolling: Mod assist   Supine to sit: Max assist;+2 for safety/equipment     General bed mobility comments: max multimodal cues for hooking RLE to assist with bringing LLE over EOB - pt able to initiate but requires cues/assist to perform, assist for trunk elevation  Transfers Overall transfer level: Needs assistance Equipment used: 2 person hand held assist Transfers: Sit to/from Stand;Stand Pivot Transfers Sit to Stand: +2 physical assistance;Max assist Stand pivot transfers: Max assist;+2 physical assistance;+2 safety/equipment       General  transfer comment: boosting and assist for stabilizing once upright, pt with RUE flexor synergy in both RUE and RLE; with max facilitation for LE movements and +2 assist pt able to take few steps to recliner. completed additional sit<>stand from recliner to sink   Ambulation/Gait Ambulation/Gait assistance: Max assist;+2 physical assistance;+2 safety/equipment Gait Distance (Feet): 4 Feet Assistive device: 2 person hand held assist Gait Pattern/deviations: Step-to pattern;Narrow base of support;Shuffle;Ataxic     General Gait Details: Steps to move from bed to recliner.   Stairs             Wheelchair Mobility    Modified Rankin (Stroke Patients Only)       Balance Overall balance assessment: Needs assistance Sitting-balance support: Feet supported;Single extremity supported Sitting balance-Leahy Scale: Fair Sitting balance - Comments: requires assist initially due to R lateral/posterior lean, once stabilized pt able to maintain for brief periods of time with close minguard assist      Standing balance-Leahy Scale: Poor Standing balance comment: external assistance to maintain standing he continues to lack weight bearing on R side.                            Cognition Arousal/Alertness: Awake/alert Behavior During Therapy: Flat affect Overall Cognitive Status: Impaired/Different from baseline Area of Impairment: Attention;Following commands;Safety/judgement;Awareness;Problem solving                   Current Attention Level: Focused Memory: Decreased short-term memory Following Commands: Follows one step commands with increased time;Follows one step commands inconsistently Safety/Judgement: Decreased awareness of safety;Decreased awareness of deficits Awareness: Intellectual Problem Solving: Slow processing;Difficulty sequencing;Requires verbal cues;Decreased initiation;Requires tactile cues General Comments:  following 1 step commands approx 50% of  the time given multimodal cues to do so, pt attempting to get out of recliner end of session without assist       Exercises      General Comments        Pertinent Vitals/Pain Pain Assessment: Faces Faces Pain Scale: No hurt Pain Location: RUE with movement Pain Descriptors / Indicators: Guarding Pain Intervention(s): Monitored during session;Repositioned    Home Living                      Prior Function            PT Goals (current goals can now be found in the care plan section) Acute Rehab PT Goals Patient Stated Goal: none stated Potential to Achieve Goals: Fair Progress towards PT goals: Progressing toward goals    Frequency    Min 2X/week      PT Plan Current plan remains appropriate    Co-evaluation PT/OT/SLP Co-Evaluation/Treatment: Yes Reason for Co-Treatment: Complexity of the patient's impairments (multi-system involvement);For patient/therapist safety PT goals addressed during session: Mobility/safety with mobility OT goals addressed during session: ADL's and self-care      AM-PAC PT "6 Clicks" Mobility   Outcome Measure  Help needed turning from your back to your side while in a flat bed without using bedrails?: A Lot Help needed moving from lying on your back to sitting on the side of a flat bed without using bedrails?: A Lot Help needed moving to and from a bed to a chair (including a wheelchair)?: Total Help needed standing up from a chair using your arms (e.g., wheelchair or bedside chair)?: Total Help needed to walk in hospital room?: Total Help needed climbing 3-5 steps with a railing? : Total 6 Click Score: 8    End of Session Equipment Utilized During Treatment: Gait belt Activity Tolerance: Patient tolerated treatment well Patient left: in chair;with chair alarm set(posey alarm belt.) Nurse Communication: Mobility status PT Visit Diagnosis: Muscle weakness (generalized) (M62.81);Hemiplegia and hemiparesis;Other  abnormalities of gait and mobility (R26.89) Hemiplegia - Right/Left: Right Hemiplegia - dominant/non-dominant: Dominant Hemiplegia - caused by: Nontraumatic intracerebral hemorrhage     Time: [id number] PT Time Calculation (min) (ACUTE ONLY): 28 min  Charges:  $Therapeutic Activity: 8-22 mins                     [name] , PTA Acute Rehabilitation Services Pager [phone] Office [phone]     Ayeden Gladman [name] 01/01/2020, 5:59 PM

## 2020-01-01 NOTE — Progress Notes (Signed)
PROGRESS NOTE    Joshua [name]  [id number] DOB: 27-Jul-1981 DOA: 09/09/2019 PCP: Patient, [name]     Brief Narrative:  Joshua [name] is a 39 y.o. male admitted by neurology on 9/12 with seizure, right hemiplegia, decreased responsiveness found to have left hemispheric intracranial hemorrhage, subsequently had a rapidly declining neurological status, intubated and admitted to ICU, underwent left craniotomy for decompression and evacuation of ICH on 9/12 by Dr. Vertell Limber.Continues to have slow neurological recovery, prolonged hospitalization treated for seizures and aspiration pneumonia.Continues to be awake and alert, but aphasic. PEG tube placement on 9/28.Disposition continues to be challenging, uninsured, needs long-term placement.  New events last 24 hours / Subjective: No new events to report  Assessment & Plan:   Principal Problem:   ICH (intracerebral hemorrhage) (HCC) Active Problems:   Subdural hematoma (HCC)   Intracerebral hemorrhage (HCC)   Cytotoxic brain edema (HCC)   Hemorrhagic stroke (HCC)   Acute respiratory failure ([place])   Essential hypertension   Severe protein-calorie malnutrition (HCC)   CKD (chronic kidney disease), stage IV (HCC)   Seizure ([place])   AKI (acute kidney injury) ([place])   Left intracranial hemorrhage Patient underwentdecompressive craniectomy and hematoma evacuation withimplantation of skull flap in the right abdomenby Dr. Vertell Limber in 9/12.Felt to be secondary to uncontrolled, untreated hypertension, CTA was negative for aneurysm or vascular malformation.Remains aphasic/nonverbal with dense right hemiplegia, cognitive impairment, inconsistently follows commands. Per Dr. Vertell Limber, given patient's bedridden status and lack of progress since surgery, no indication for replacement of his skull at this time as would only be a cosmetic fix. Currently a difficult placement.  Seizures Secondary to above -Continue Keppra 500 mg BID  Aspiration  pneumonia Dysphagia Completed antibiotics. PEG tube placed.  Klebsiella oxytoca UTI Treated with Cefazolin  Essential hypertension Stable -Continue amlodipine, hydralazine, lopressor    DVT prophylaxis: Lovenox Code Status: Full Family Communication: None at bedside Disposition Plan: Pending SNF placement    Antimicrobials:  Anti-infectives (From admission, onward)   Start     Dose/Rate Route Frequency Ordered Stop   09/27/19 1130  cephALEXin (KEFLEX) 250 MG/5ML suspension 500 mg     500 mg Per Tube Every 12 hours 09/27/19 1052 09/29/19 2250   09/25/19 1445  ceFAZolin (ANCEF) IVPB 2g/100 mL premix  Status:  Discontinued     2 g 200 mL/hr over 30 Minutes Intravenous Every 12 hours 09/25/19 1417 09/27/19 1052   09/14/19 1000  Ampicillin-Sulbactam (UNASYN) 3 g in sodium chloride 0.9 % 100 mL IVPB     3 g 200 mL/hr over 30 Minutes Intravenous Every 8 hours 09/14/19 0922 09/17/19 1816   09/13/19 1100  vancomycin (VANCOCIN) IVPB 750 mg/150 ml premix  Status:  Discontinued     750 mg 150 mL/hr over 60 Minutes Intravenous Every 24 hours 09/13/19 0728 09/13/19 0838   09/12/19 1100  vancomycin (VANCOCIN) IVPB 1000 mg/200 mL premix  Status:  Discontinued     1,000 mg 200 mL/hr over 60 Minutes Intravenous Every 24 hours 09/11/19 1034 09/13/19 0728   09/11/19 1130  piperacillin-tazobactam (ZOSYN) IVPB 3.375 g  Status:  Discontinued     3.375 g 12.5 mL/hr over 240 Minutes Intravenous Every 8 hours 09/11/19 1054 09/14/19 0845   09/11/19 1100  vancomycin (VANCOCIN) 1,500 mg in sodium chloride 0.9 % 500 mL IVPB     1,500 mg 250 mL/hr over 120 Minutes Intravenous  Once 09/11/19 1034 09/11/19 1900   09/11/19 1015  piperacillin-tazobactam (ZOSYN) IVPB 4.5 g  Status:  Discontinued     4.5 g 200 mL/hr over 30 Minutes Intravenous Every 8 hours 09/11/19 1010 09/11/19 1052   09/09/19 0845  ceFAZolin (ANCEF) IVPB 2g/100 mL premix     2 g 200 mL/hr over 30 Minutes Intravenous Every 8 hours  09/09/19 0842 09/09/19 1658       Objective: Vitals:   12/31/19 2022 01/01/20 0016 01/01/20 0414 01/01/20 0737  BP: 107/71 101/76 (!) 119/91 122/87  Pulse: 85 86 76 89  Resp: 17 18 19 16   Temp: 97.8 F (36.6 C) 98.1 F (36.7 C) 98 F (36.7 C) 98.7 F (37.1 C)  TempSrc: Oral Oral Oral Oral  SpO2: 100% 98% 100% 100%  Weight:      Height:       No intake or output data in the 24 hours ending 01/01/20 1000 Filed Weights   12/26/19 0400 12/28/19 0420 12/29/19 0414  Weight: 59.5 kg 58.2 kg 58 kg    Examination: General exam: Appears calm and comfortable  Central nervous system: Alert    Data Reviewed: I have personally reviewed following labs and imaging studies  CBC: Recent Labs  Lab 12/25/19 1118 12/27/19 0301  WBC 7.7 7.0  HGB 11.8* 11.3*  HCT 37.3* 35.4*  MCV 84.0 82.9  PLT 313 Q000111Q   Basic Metabolic Panel: Recent Labs  Lab 12/25/19 1118 12/27/19 0301  NA 138 140  K 4.5 4.6  CL 100 101  CO2 31 28  GLUCOSE 99 79  BUN 46* 43*  CREATININE 1.53* 1.38*  CALCIUM 10.2 10.2   GFR: Estimated Creatinine Clearance: 58.4 mL/min (A) (by C-G formula based on SCr of 1.38 mg/dL (H)). Liver Function Tests: Recent Labs  Lab 12/27/19 0301  AST 18  ALT 24  ALKPHOS 75  BILITOT 0.1*  PROT 7.0  ALBUMIN 3.0*   No results for input(s): LIPASE, AMYLASE in the last 168 hours. No results for input(s): AMMONIA in the last 168 hours. Coagulation Profile: No results for input(s): INR, PROTIME in the last 168 hours. Cardiac Enzymes: No results for input(s): CKTOTAL, CKMB, CKMBINDEX, TROPONINI in the last 168 hours. BNP (last 3 results) No results for input(s): PROBNP in the last 8760 hours. HbA1C: No results for input(s): HGBA1C in the last 72 hours. CBG: Recent Labs  Lab 12/31/19 2021 01/01/20 0015 01/01/20 0414 01/01/20 0622 01/01/20 0736  GLUCAP 87 171* 59* 120* 95   Lipid Profile: No results for input(s): CHOL, HDL, LDLCALC, TRIG, CHOLHDL, LDLDIRECT in  the last 72 hours. Thyroid Function Tests: No results for input(s): TSH, T4TOTAL, FREET4, T3FREE, THYROIDAB in the last 72 hours. Anemia Panel: No results for input(s): VITAMINB12, FOLATE, FERRITIN, TIBC, IRON, RETICCTPCT in the last 72 hours. Sepsis Labs: No results for input(s): PROCALCITON, LATICACIDVEN in the last 168 hours.  No results found for this or any previous visit (from the past 240 hour(s)).    Radiology Studies: No results found.    Scheduled Meds: . amLODipine  10 mg Per Tube Daily  . chlorhexidine  15 mL Mouth Rinse BID  . enoxaparin (LOVENOX) injection  40 mg Subcutaneous Daily  . feeding supplement (OSMOLITE 1.2 CAL)  474 mL Per Tube QID  . feeding supplement (PRO-STAT SUGAR FREE 64)  30 mL Per Tube BID  . folic acid  1 mg Per Tube Daily  . free water  300 mL Per Tube Q4H  . hydrALAZINE  25 mg Per Tube Q8H  . insulin aspart  0-15 Units Subcutaneous Q4H  . levETIRAcetam  500 mg Per Tube BID  . mouth rinse  15 mL Mouth Rinse q12n4p  . metoprolol tartrate  100 mg Per Tube BID  . multivitamin  15 mL Per Tube Daily  . pantoprazole sodium  40 mg Per Tube BID  . QUEtiapine  50 mg Per Tube QHS  . thiamine  100 mg Per Tube Daily   Continuous Infusions:   LOS: 114 days      Time spent: 10 minutes   Dessa Phi, DO [facility] 01/01/2020, 10:00 AM   Available via Epic secure chat 7am-7pm After these hours, please refer to coverage provider listed on [email or web address]

## 2020-01-01 NOTE — Progress Notes (Signed)
Patient continues to refuse medications and tube feedings. Will continue to offer. Joshua [name]

## 2020-01-01 NOTE — Progress Notes (Addendum)
Physical Therapy Treatment Patient Details Name: Joshua [name] MRN: [id number] DOB: 1981-07-24 Today's Date: 01/01/2020    History of Present Illness Pt is a 39 y.o. 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 S/p L frontoparietal craniectomy for evacuation of L basal ganglia hematoma 9/12. ETT 9/12-9/18, 9/18-9/23. PEG tube placed 9/28 secondary to dysphagia. PMH includes alcohol abuse, seizures, HTN.    PT Comments    Pt required assistance to boost hips and pivot for back to bed transfer increased time to move to bed from recliner.  Pt is slow and guarded and require increased time to process.     Follow Up Recommendations  SNF;Supervision/Assistance - 24 hour     Equipment Recommendations  Wheelchair (measurements PT);Wheelchair cushion (measurements PT);Hospital bed;3in1 (PT)    Recommendations for Other Services Rehab consult     Precautions / Restrictions Precautions Precautions: Fall Precaution Comments: L crani, PEG, helmet Required Braces or Orthoses: Splint/Cast Splint/Cast: resting hand splint and elbow splint for R UE due synergistic pattern Restrictions Weight Bearing Restrictions: No    Mobility  Bed Mobility Overal bed mobility: Needs Assistance Bed Mobility: Supine to Sit;Sit to Supine Rolling: Mod assist    Sit to supine: Mod assist   General bed mobility comments: mod assistance to lower trunk and lift feet against gravity.  Transfers Overall transfer level: Needs assistance Equipment used: (face to face squat pivot)    Squat pivot transfers: Max assist     General transfer comment: Boosting and squat pivot to move hips from recliner back to bed,  Ambulation/Gait            Stairs             Wheelchair Mobility    Modified Rankin (Stroke Patients Only)       Balance Overall balance assessment: Needs assistance Sitting-balance support: Feet supported;Single extremity  supported Sitting balance-Leahy Scale: Fair Sitting balance - Comments: requires assist initially due to R lateral/posterior lean, once stabilized pt able to maintain for brief periods of time with close minguard assist      Standing balance-Leahy Scale: Poor Standing balance comment: external assistance to maintain standing he continues to lack weight bearing on R side.                            Cognition Arousal/Alertness: Awake/alert Behavior During Therapy: Flat affect Overall Cognitive Status: Impaired/Different from baseline Area of Impairment: Attention;Following commands;Safety/judgement;Awareness;Problem solving                   Current Attention Level: Focused Memory: Decreased short-term memory Following Commands: Follows one step commands with increased time;Follows one step commands inconsistently Safety/Judgement: Decreased awareness of safety;Decreased awareness of deficits Awareness: Intellectual Problem Solving: Slow processing;Difficulty sequencing;Requires verbal cues;Decreased initiation;Requires tactile cues General Comments: following 1 step commands approx 50% of the time given multimodal cues to do so, pt attempting to get out of recliner end of session without assist       Exercises      General Comments        Pertinent Vitals/Pain Pain Assessment: Faces Faces Pain Scale: No hurt Pain Location: RUE with movement Pain Descriptors / Indicators: Guarding Pain Intervention(s): Monitored during session;Repositioned    Home Living                      Prior Function  PT Goals (current goals can now be found in the care plan section) Acute Rehab PT Goals Patient Stated Goal: none stated Potential to Achieve Goals: Fair Progress towards PT goals: Progressing toward goals    Frequency    Min 2X/week      PT Plan Current plan remains appropriate    Co-evaluation PT/OT/SLP Co-Evaluation/Treatment:  Yes Reason for Co-Treatment: Complexity of the patient's impairments (multi-system involvement);For patient/therapist safety PT goals addressed during session: Mobility/safety with mobility OT goals addressed during session: ADL's and self-care      AM-PAC PT "6 Clicks" Mobility   Outcome Measure  Help needed turning from your back to your side while in a flat bed without using bedrails?: A Lot Help needed moving from lying on your back to sitting on the side of a flat bed without using bedrails?: A Lot Help needed moving to and from a bed to a chair (including a wheelchair)?: Total Help needed standing up from a chair using your arms (e.g., wheelchair or bedside chair)?: Total Help needed to walk in hospital room?: Total Help needed climbing 3-5 steps with a railing? : Total 6 Click Score: 8    End of Session Equipment Utilized During Treatment: Gait belt Activity Tolerance: Patient tolerated treatment well Patient left: in chair;with chair alarm set(posey alarm belt.) Nurse Communication: Mobility status PT Visit Diagnosis: Muscle weakness (generalized) (M62.81);Hemiplegia and hemiparesis;Other abnormalities of gait and mobility (R26.89) Hemiplegia - Right/Left: Right Hemiplegia - dominant/non-dominant: Dominant Hemiplegia - caused by: Nontraumatic intracerebral hemorrhage     Time: [id number] PT Time Calculation (min) (ACUTE ONLY): 14 min  Charges:  $Therapeutic Activity: 8-22 mins                     [name] , PTA Acute Rehabilitation Services Pager [phone] Office [phone]     Tagen Brethauer [name] 01/01/2020, 6:04 PM

## 2020-01-02 DIAGNOSIS — I611 Nontraumatic intracerebral hemorrhage in hemisphere, cortical: Secondary | ICD-10-CM | POA: Diagnosis not present

## 2020-01-02 LAB — GLUCOSE, CAPILLARY
Glucose-Capillary: 62 mg/dL — ABNORMAL LOW (ref 70–99)
Glucose-Capillary: 102 mg/dL — ABNORMAL HIGH (ref 70–99)
Glucose-Capillary: 99 mg/dL (ref 70–99)
Glucose-Capillary: 183 mg/dL — ABNORMAL HIGH (ref 70–99)
Glucose-Capillary: 75 mg/dL (ref 70–99)
Glucose-Capillary: 106 mg/dL — ABNORMAL HIGH (ref 70–99)
Glucose-Capillary: 97 mg/dL (ref 70–99)

## 2020-01-02 NOTE — Progress Notes (Signed)
PROGRESS NOTE    Joshua [name]  [id number] DOB: September 08, 1981 DOA: 09/09/2019 PCP: Patient, [name]     Brief Narrative:  Joshua [name] is a 39 y.o. male admitted by neurology on 9/12 with seizure, right hemiplegia, decreased responsiveness found to have left hemispheric intracranial hemorrhage, subsequently had a rapidly declining neurological status, intubated and admitted to ICU, underwent left craniotomy for decompression and evacuation of ICH on 9/12 by Dr. Vertell Limber.Continues to have slow neurological recovery, prolonged hospitalization treated for seizures and aspiration pneumonia.Continues to be awake and alert, but aphasic. PEG tube placement on 9/28.Disposition continues to be challenging, uninsured, needs long-term placement.  New events last 24 hours / Subjective: Continues to have intermittent episodes of refusing meds and food. Remains aphasic   Assessment & Plan:   Principal Problem:   ICH (intracerebral hemorrhage) (HCC) Active Problems:   Subdural hematoma (HCC)   Intracerebral hemorrhage (HCC)   Cytotoxic brain edema (HCC)   Hemorrhagic stroke (HCC)   Acute respiratory failure ([place])   Essential hypertension   Severe protein-calorie malnutrition (HCC)   CKD (chronic kidney disease), stage IV (HCC)   Seizure ([place])   AKI (acute kidney injury) ([place])   Left intracranial hemorrhage Patient underwentdecompressive craniectomy and hematoma evacuation withimplantation of skull flap in the right abdomenby Dr. Vertell Limber in 9/12.Felt to be secondary to uncontrolled, untreated hypertension, CTA was negative for aneurysm or vascular malformation.Remains aphasic/nonverbal with dense right hemiplegia, cognitive impairment, inconsistently follows commands. Per Dr. Vertell Limber, given patient's bedridden status and lack of progress since surgery, no indication for replacement of his skull at this time as would only be a cosmetic fix. Currently a difficult  placement.  Seizures Secondary to above -Continue Keppra 500 mg BID  Aspiration pneumonia Dysphagia Completed antibiotics. PEG tube placed.  Klebsiella oxytoca UTI Treated with Cefazolin  Essential hypertension Stable -Continue amlodipine, hydralazine, lopressor    DVT prophylaxis: Lovenox Code Status: Full Family Communication: None at bedside Disposition Plan: Pending SNF placement    Antimicrobials:  Anti-infectives (From admission, onward)   Start     Dose/Rate Route Frequency Ordered Stop   09/27/19 1130  cephALEXin (KEFLEX) 250 MG/5ML suspension 500 mg     500 mg Per Tube Every 12 hours 09/27/19 1052 09/29/19 2250   09/25/19 1445  ceFAZolin (ANCEF) IVPB 2g/100 mL premix  Status:  Discontinued     2 g 200 mL/hr over 30 Minutes Intravenous Every 12 hours 09/25/19 1417 09/27/19 1052   09/14/19 1000  Ampicillin-Sulbactam (UNASYN) 3 g in sodium chloride 0.9 % 100 mL IVPB     3 g 200 mL/hr over 30 Minutes Intravenous Every 8 hours 09/14/19 0922 09/17/19 1816   09/13/19 1100  vancomycin (VANCOCIN) IVPB 750 mg/150 ml premix  Status:  Discontinued     750 mg 150 mL/hr over 60 Minutes Intravenous Every 24 hours 09/13/19 0728 09/13/19 0838   09/12/19 1100  vancomycin (VANCOCIN) IVPB 1000 mg/200 mL premix  Status:  Discontinued     1,000 mg 200 mL/hr over 60 Minutes Intravenous Every 24 hours 09/11/19 1034 09/13/19 0728   09/11/19 1130  piperacillin-tazobactam (ZOSYN) IVPB 3.375 g  Status:  Discontinued     3.375 g 12.5 mL/hr over 240 Minutes Intravenous Every 8 hours 09/11/19 1054 09/14/19 0845   09/11/19 1100  vancomycin (VANCOCIN) 1,500 mg in sodium chloride 0.9 % 500 mL IVPB     1,500 mg 250 mL/hr over 120 Minutes Intravenous  Once 09/11/19 1034 09/11/19 1900   09/11/19 1015  piperacillin-tazobactam (ZOSYN) IVPB 4.5 g  Status:  Discontinued     4.5 g 200 mL/hr over 30 Minutes Intravenous Every 8 hours 09/11/19 1010 09/11/19 1052   09/09/19 0845  ceFAZolin (ANCEF)  IVPB 2g/100 mL premix     2 g 200 mL/hr over 30 Minutes Intravenous Every 8 hours 09/09/19 0842 09/09/19 1658       Objective: Vitals:   01/01/20 2348 01/02/20 0406 01/02/20 0500 01/02/20 0829  BP: 122/79 113/80  112/88  Pulse: 95 87  91  Resp: 18 16  16   Temp: 97.8 F (36.6 C) 98 F (36.7 C)  98.7 F (37.1 C)  TempSrc:  Oral  Oral  SpO2: 100% 98%  100%  Weight:   61 kg   Height:       No intake or output data in the 24 hours ending 01/02/20 0919 Filed Weights   12/28/19 0420 12/29/19 0414 01/02/20 0500  Weight: 58.2 kg 58 kg 61 kg    Examination: General exam: Appears calm and comfortable  Central nervous system: Alert, remains aphasic    Data Reviewed: I have personally reviewed following labs and imaging studies  CBC: Recent Labs  Lab 12/27/19 0301  WBC 7.0  HGB 11.3*  HCT 35.4*  MCV 82.9  PLT Q000111Q   Basic Metabolic Panel: Recent Labs  Lab 12/27/19 0301  NA 140  K 4.6  CL 101  CO2 28  GLUCOSE 79  BUN 43*  CREATININE 1.38*  CALCIUM 10.2   GFR: Estimated Creatinine Clearance: 61.4 mL/min (A) (by C-G formula based on SCr of 1.38 mg/dL (H)). Liver Function Tests: Recent Labs  Lab 12/27/19 0301  AST 18  ALT 24  ALKPHOS 75  BILITOT 0.1*  PROT 7.0  ALBUMIN 3.0*   No results for input(s): LIPASE, AMYLASE in the last 168 hours. No results for input(s): AMMONIA in the last 168 hours. Coagulation Profile: No results for input(s): INR, PROTIME in the last 168 hours. Cardiac Enzymes: No results for input(s): CKTOTAL, CKMB, CKMBINDEX, TROPONINI in the last 168 hours. BNP (last 3 results) No results for input(s): PROBNP in the last 8760 hours. HbA1C: No results for input(s): HGBA1C in the last 72 hours. CBG: Recent Labs  Lab 01/01/20 2110 01/01/20 2345 01/02/20 0404 01/02/20 0618 01/02/20 0833  GLUCAP 103* 191* 62* 102* 99   Lipid Profile: No results for input(s): CHOL, HDL, LDLCALC, TRIG, CHOLHDL, LDLDIRECT in the last 72 hours. Thyroid  Function Tests: No results for input(s): TSH, T4TOTAL, FREET4, T3FREE, THYROIDAB in the last 72 hours. Anemia Panel: No results for input(s): VITAMINB12, FOLATE, FERRITIN, TIBC, IRON, RETICCTPCT in the last 72 hours. Sepsis Labs: No results for input(s): PROCALCITON, LATICACIDVEN in the last 168 hours.  No results found for this or any previous visit (from the past 240 hour(s)).    Radiology Studies: No results found.    Scheduled Meds: . amLODipine  10 mg Per Tube Daily  . chlorhexidine  15 mL Mouth Rinse BID  . enoxaparin (LOVENOX) injection  40 mg Subcutaneous Daily  . feeding supplement (OSMOLITE 1.2 CAL)  474 mL Per Tube QID  . feeding supplement (PRO-STAT SUGAR FREE 64)  30 mL Per Tube BID  . folic acid  1 mg Per Tube Daily  . free water  300 mL Per Tube Q4H  . hydrALAZINE  25 mg Per Tube Q8H  . insulin aspart  0-15 Units Subcutaneous Q4H  . levETIRAcetam  500 mg Per Tube BID  . mouth rinse  15 mL Mouth Rinse q12n4p  . metoprolol tartrate  100 mg Per Tube BID  . multivitamin  15 mL Per Tube Daily  . pantoprazole sodium  40 mg Per Tube BID  . QUEtiapine  50 mg Per Tube QHS  . thiamine  100 mg Per Tube Daily   Continuous Infusions:   LOS: 115 days      Time spent: 10 minutes   Dessa Phi, DO [facility] 01/02/2020, 9:19 AM   Available via Epic secure chat 7am-7pm After these hours, please refer to coverage provider listed on [email or web address]

## 2020-01-02 NOTE — Progress Notes (Signed)
Nutrition Follow-up  DOCUMENTATION CODES:   Severe malnutrition in context of acute illness/injury  INTERVENTION:  ContinueOsmolite 1.2 formula via PEG at goal volume of 474 ml (2 cartons/ARCs) given QID.   Provide 30 ml Prostat BID per tube.   Free water flushes of 300 ml every 4 hours per tube. (MD to adjust as appropriate)  Tube feeding to provide 2475 kcal, 135 grams of protein, and 3355 ml free water.  Continue PO intake for comfort as appropriate/tolerated.  NUTRITION DIAGNOSIS:   Severe Malnutrition related to acute illness(intracerebral hemorrage) as evidenced by severe fat depletion, severe muscle depletion; ongoing  GOAL:   Patient will meet greater than or equal to 90% of their needs; met with TF  MONITOR:   TF tolerance, Skin, Weight trends, Labs, I & O's  REASON FOR ASSESSMENT:   Ventilator, Consult Enteral/tube feeding initiation and management  ASSESSMENT:   39 yo male admitted with large left intracranial hemorrhage; S/P craniotomy. PMH includes HTN, alcoholism, noncompliance.  9/12left frontal temporal craniectomy and evacuation hematoma.  9/23 Extubated.  10/1 PEG placed 11/23- Nutrition Focused Physical Exam performed, severe subcutaneous fat loss and severe muscle wasting identified.    Pt with intermittent episodes of refusing meds and PO. RD to continue with current orders and monitor for tolerance. Labs and medications reviewed. Pt disposition continues to be challenging as pt uninsured and needs long-term placement.  Diet Order:   Diet Order            Diet regular Room service appropriate? No; Fluid consistency: Thin  Diet effective now              EDUCATION NEEDS:   Not appropriate for education at this time  Skin:  Skin Assessment: Reviewed RN Assessment  Last BM:  1/2  Height:   Ht Readings from Last 1 Encounters:  09/09/19 _0  (1.753 m)    Weight:   Wt Readings from Last 1 Encounters:  01/02/20 61 kg     Ideal Body Weight:  72.7 kg  BMI:  Body mass index is 19.86 kg/m.  Estimated Nutritional Needs:   Kcal:  2100-2400  Protein:  105-125 grams  Fluid:  >/= 2 L/day    Corrin Parker, MS, RD, LDN Pager # [phone] After hours/ weekend pager # [phone]

## 2020-01-03 DIAGNOSIS — I611 Nontraumatic intracerebral hemorrhage in hemisphere, cortical: Secondary | ICD-10-CM | POA: Diagnosis not present

## 2020-01-03 LAB — GLUCOSE, CAPILLARY
Glucose-Capillary: 95 mg/dL (ref 70–99)
Glucose-Capillary: 99 mg/dL (ref 70–99)
Glucose-Capillary: 105 mg/dL — ABNORMAL HIGH (ref 70–99)
Glucose-Capillary: 75 mg/dL (ref 70–99)
Glucose-Capillary: 139 mg/dL — ABNORMAL HIGH (ref 70–99)

## 2020-01-03 NOTE — Progress Notes (Signed)
  Speech Language Pathology Treatment: Dysphagia  Patient Details Name: Joshua [name] MRN: [id number] DOB: 26-Jul-1981 Today's Date: 01/03/2020 Time: [id number] SLP Time Calculation (min) (ACUTE ONLY): 14 min  Assessment / Plan / Recommendation Clinical Impression  Skilled treatment session provided for ongoing dysphagia and diet safety goals. SLP recieved pt upright in bed, alert and pt with good (improved) eye contact with this Probation officer. Pt's breakfast tray present. SLP provided set-up of tray with food items visible and handed pt cup. He pushed tray out of his direction. He refused any PO intake during session. Per chart review pt has been on regular diet since 12/16 and has refused ST services 3 out of 4 times since this date. He is currently refusing PO intake with nursing as well. The one time that he participated in [place] services he appeared appropriate on his currently prescribed diet. I am recommending discharging him from [facility] d/t lack of participation and no overt s/s of aspiration of dysphagia on the infrequent opportunities that he does consume PO intake.  This Probation officer sent secure chat to pt's MD and she was in agreement with decision to discharge at this time.    HPI HPI: Pt is a 39 year old male who presented with new seizure, and was found to have a large left BG intracranial hemorrhage s/p L frontal temporal craniectomy with evacuation 9/12. ETT 9/12-9/18; 9/18-9/23. Has been seen repeatedly by SLP, pts attention impacts ability to take PO, resists assistance, has been NPO since admit. Has recently shown interest in water, SLP reconsulted. PMH: HTN, alcoholism, noncompliance.  Pt has been on a po diet but refuses to eat with this SLP.  RN reports pt has been resistant to tube feeding.      SLP Plan  Discharge SLP treatment due to (comment)(pt refusal to eat over several sessions)       Recommendations  Diet recommendations: Regular;Thin liquid Liquids provided via:  Cup;Straw Medication Administration: Via alternative means Compensations: Minimize environmental distractions Postural Changes and/or Swallow Maneuvers: Seated upright 90 degrees;Upright 30-60 min after meal                Oral Care Recommendations: Oral care QID;Staff/trained caregiver to provide oral care Follow up Recommendations: Skilled Nursing facility SLP Visit Diagnosis: Dysphagia, unspecified (R13.10) Plan: Discharge SLP treatment due to (comment)(pt refusal to eat over several sessions)       GO                Ruhama Lehew 01/03/2020, 11:24 AM

## 2020-01-03 NOTE — Progress Notes (Signed)
PROGRESS NOTE    Joshua [name]  [id number] DOB: [birth year] DOA: 09/09/2019 PCP: Patient, [name]     Brief Narrative:  Joshua [name] is a 39 y.o. male admitted by neurology on 9/12 with seizure, right hemiplegia, decreased responsiveness found to have left hemispheric intracranial hemorrhage, subsequently had a rapidly declining neurological status, intubated and admitted to ICU, underwent left craniotomy for decompression and evacuation of ICH on 9/12 by Dr. Vertell Limber.Continues to have slow neurological recovery, prolonged hospitalization treated for seizures and aspiration pneumonia.Continues to be awake and alert, but aphasic. PEG tube placement on 9/28.Disposition continues to be challenging, uninsured, needs long-term placement.  New events last 24 hours / Subjective: No new issues reported  Assessment & Plan:   Principal Problem:   ICH (intracerebral hemorrhage) (HCC) Active Problems:   Subdural hematoma (HCC)   Intracerebral hemorrhage (HCC)   Cytotoxic brain edema (HCC)   Hemorrhagic stroke (HCC)   Acute respiratory failure ([place])   Essential hypertension   Severe protein-calorie malnutrition (HCC)   CKD (chronic kidney disease), stage IV (HCC)   Seizure ([place])   AKI (acute kidney injury) ([place])   Left intracranial hemorrhage Patient underwentdecompressive craniectomy and hematoma evacuation withimplantation of skull flap in the right abdomenby Dr. Vertell Limber in 9/12.Felt to be secondary to uncontrolled, untreated hypertension, CTA was negative for aneurysm or vascular malformation.Remains aphasic/nonverbal with dense right hemiplegia, cognitive impairment, inconsistently follows commands. Per Dr. Vertell Limber, given patient's bedridden status and lack of progress since surgery, no indication for replacement of his skull at this time as would only be a cosmetic fix. Currently a difficult placement.  Seizures Secondary to above -Continue Keppra 500 mg BID  Aspiration  pneumonia Dysphagia Completed antibiotics. PEG tube placed.  Klebsiella oxytoca UTI Treated with Cefazolin  Essential hypertension Stable -Continue amlodipine, hydralazine, lopressor    DVT prophylaxis: Lovenox Code Status: Full Family Communication: None at bedside Disposition Plan: Pending SNF placement, remains a difficult placement    Antimicrobials:  Anti-infectives (From admission, onward)   Start     Dose/Rate Route Frequency Ordered Stop   09/27/19 1130  cephALEXin (KEFLEX) 250 MG/5ML suspension 500 mg     500 mg Per Tube Every 12 hours 09/27/19 1052 09/29/19 2250   09/25/19 1445  ceFAZolin (ANCEF) IVPB 2g/100 mL premix  Status:  Discontinued     2 g 200 mL/hr over 30 Minutes Intravenous Every 12 hours 09/25/19 1417 09/27/19 1052   09/14/19 1000  Ampicillin-Sulbactam (UNASYN) 3 g in sodium chloride 0.9 % 100 mL IVPB     3 g 200 mL/hr over 30 Minutes Intravenous Every 8 hours 09/14/19 0922 09/17/19 1816   09/13/19 1100  vancomycin (VANCOCIN) IVPB 750 mg/150 ml premix  Status:  Discontinued     750 mg 150 mL/hr over 60 Minutes Intravenous Every 24 hours 09/13/19 0728 09/13/19 0838   09/12/19 1100  vancomycin (VANCOCIN) IVPB 1000 mg/200 mL premix  Status:  Discontinued     1,000 mg 200 mL/hr over 60 Minutes Intravenous Every 24 hours 09/11/19 1034 09/13/19 0728   09/11/19 1130  piperacillin-tazobactam (ZOSYN) IVPB 3.375 g  Status:  Discontinued     3.375 g 12.5 mL/hr over 240 Minutes Intravenous Every 8 hours 09/11/19 1054 09/14/19 0845   09/11/19 1100  vancomycin (VANCOCIN) 1,500 mg in sodium chloride 0.9 % 500 mL IVPB     1,500 mg 250 mL/hr over 120 Minutes Intravenous  Once 09/11/19 1034 09/11/19 1900   09/11/19 1015  piperacillin-tazobactam (ZOSYN) IVPB 4.5 g  Status:  Discontinued     4.5 g 200 mL/hr over 30 Minutes Intravenous Every 8 hours 09/11/19 1010 09/11/19 1052   09/09/19 0845  ceFAZolin (ANCEF) IVPB 2g/100 mL premix     2 g 200 mL/hr over 30 Minutes  Intravenous Every 8 hours 09/09/19 0842 09/09/19 1658       Objective: Vitals:   01/02/20 2003 01/02/20 2323 01/03/20 0443 01/03/20 0825  BP: 121/82 90/65 106/71 114/72  Pulse: 96 92 88 93  Resp: 18 18 18 16   Temp: 98 F (36.7 C) 99 F (37.2 C) 99.1 F (37.3 C) 98.9 F (37.2 C)  TempSrc: Oral Oral Oral Oral  SpO2: 100% 96% 99% 98%  Weight:      Height:       No intake or output data in the 24 hours ending 01/03/20 0932 Filed Weights   12/28/19 0420 12/29/19 0414 01/02/20 0500  Weight: 58.2 kg 58 kg 61 kg    Examination: General exam: Appears calm and comfortable  Central nervous system: Alert and remains aphasic  Data Reviewed: I have personally reviewed following labs and imaging studies  CBC: No results for input(s): WBC, NEUTROABS, HGB, HCT, MCV, PLT in the last 168 hours. Basic Metabolic Panel: No results for input(s): NA, K, CL, CO2, GLUCOSE, BUN, CREATININE, CALCIUM, MG, PHOS in the last 168 hours. GFR: Estimated Creatinine Clearance: 61.4 mL/min (A) (by C-G formula based on SCr of 1.38 mg/dL (H)). Liver Function Tests: No results for input(s): AST, ALT, ALKPHOS, BILITOT, PROT, ALBUMIN in the last 168 hours. No results for input(s): LIPASE, AMYLASE in the last 168 hours. No results for input(s): AMMONIA in the last 168 hours. Coagulation Profile: No results for input(s): INR, PROTIME in the last 168 hours. Cardiac Enzymes: No results for input(s): CKTOTAL, CKMB, CKMBINDEX, TROPONINI in the last 168 hours. BNP (last 3 results) No results for input(s): PROBNP in the last 8760 hours. HbA1C: No results for input(s): HGBA1C in the last 72 hours. CBG: Recent Labs  Lab 01/02/20 1632 01/02/20 1935 01/02/20 2312 01/03/20 0309 01/03/20 0827  GLUCAP 75 106* 97 95 99   Lipid Profile: No results for input(s): CHOL, HDL, LDLCALC, TRIG, CHOLHDL, LDLDIRECT in the last 72 hours. Thyroid Function Tests: No results for input(s): TSH, T4TOTAL, FREET4, T3FREE,  THYROIDAB in the last 72 hours. Anemia Panel: No results for input(s): VITAMINB12, FOLATE, FERRITIN, TIBC, IRON, RETICCTPCT in the last 72 hours. Sepsis Labs: No results for input(s): PROCALCITON, LATICACIDVEN in the last 168 hours.  No results found for this or any previous visit (from the past 240 hour(s)).    Radiology Studies: No results found.    Scheduled Meds: . amLODipine  10 mg Per Tube Daily  . chlorhexidine  15 mL Mouth Rinse BID  . enoxaparin (LOVENOX) injection  40 mg Subcutaneous Daily  . feeding supplement (OSMOLITE 1.2 CAL)  474 mL Per Tube QID  . feeding supplement (PRO-STAT SUGAR FREE 64)  30 mL Per Tube BID  . folic acid  1 mg Per Tube Daily  . free water  300 mL Per Tube Q4H  . hydrALAZINE  25 mg Per Tube Q8H  . insulin aspart  0-15 Units Subcutaneous Q4H  . levETIRAcetam  500 mg Per Tube BID  . mouth rinse  15 mL Mouth Rinse q12n4p  . metoprolol tartrate  100 mg Per Tube BID  . multivitamin  15 mL Per Tube Daily  . pantoprazole sodium  40 mg Per Tube BID  .  QUEtiapine  50 mg Per Tube QHS  . thiamine  100 mg Per Tube Daily   Continuous Infusions:   LOS: 116 days      Time spent: 10 minutes   Dessa Phi, DO [facility] 01/03/2020, 9:32 AM   Available via Epic secure chat 7am-7pm After these hours, please refer to coverage provider listed on [email or web address]

## 2020-01-04 DIAGNOSIS — I611 Nontraumatic intracerebral hemorrhage in hemisphere, cortical: Secondary | ICD-10-CM | POA: Diagnosis not present

## 2020-01-04 LAB — GLUCOSE, CAPILLARY
Glucose-Capillary: 193 mg/dL — ABNORMAL HIGH (ref 70–99)
Glucose-Capillary: 69 mg/dL — ABNORMAL LOW (ref 70–99)
Glucose-Capillary: 69 mg/dL — ABNORMAL LOW (ref 70–99)
Glucose-Capillary: 106 mg/dL — ABNORMAL HIGH (ref 70–99)
Glucose-Capillary: 95 mg/dL (ref 70–99)
Glucose-Capillary: 128 mg/dL — ABNORMAL HIGH (ref 70–99)
Glucose-Capillary: 106 mg/dL — ABNORMAL HIGH (ref 70–99)
Glucose-Capillary: 94 mg/dL (ref 70–99)

## 2020-01-04 NOTE — Progress Notes (Signed)
Physical Therapy Treatment Patient Details Name: Joshua [name] MRN: [id number] DOB: 27-Oct-1981 Today's Date: 01/04/2020    History of Present Illness Pt is a 39 y.o. 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 S/p L frontoparietal craniectomy for evacuation of L basal ganglia hematoma 9/12. ETT 9/12-9/18, 9/18-9/23. PEG tube placed 9/28 secondary to dysphagia. PMH includes alcohol abuse, seizures, HTN.    PT Comments    Pt supine in bed on arrival.  He presents with bowel incontinence during session.  Performed rolling to R and L side to perform pericare and place new brief.  Pt is resistant to movement due to fear of falling.  He was able to achieve standing during session and pivot with external assistance.  Pt sat in recliner with feet elevated.  He was starting to voice communication but it was unintelligible.  He continues to benefit from snf placement at this time.    Follow Up Recommendations  SNF;Supervision/Assistance - 24 hour     Equipment Recommendations  Wheelchair (measurements PT);Wheelchair cushion (measurements PT);Hospital bed;3in1 (PT)    Recommendations for Other Services Rehab consult     Precautions / Restrictions Precautions Precautions: Fall Precaution Comments: L crani, PEG, helmet Required Braces or Orthoses: Splint/Cast Splint/Cast: resting hand splint and elbow splint for R UE due synergistic pattern Restrictions Weight Bearing Restrictions: No    Mobility  Bed Mobility Overal bed mobility: Needs Assistance Bed Mobility: Supine to Sit;Sit to Supine Rolling: Mod assist;+2 for physical assistance Sidelying to sit: +2 for physical assistance;Max assist       General bed mobility comments: Pt resistive to movements today but once sitting edge of bed able to maintain sitting.  He did iniiate trunk elevation.  Transfers Overall transfer level: Needs assistance Equipment used: None(face to face stand  pivot.) Transfers: Sit to/from [facility] Sit to Stand: Max assist;+2 physical assistance Stand pivot transfers: Total assist;+2 physical assistance       General transfer comment: Pt required 2 person HHA to stand and pivot to recliner.  Pt noted to push to the R  Ambulation/Gait                 Stairs             Wheelchair Mobility    Modified Rankin (Stroke Patients Only)       Balance Overall balance assessment: Needs assistance   Sitting balance-Leahy Scale: Fair       Standing balance-Leahy Scale: Poor Standing balance comment: external assistance to maintain standing he continues to lack weight bearing on R side.                            Cognition Arousal/Alertness: Awake/alert Behavior During Therapy: Flat affect Overall Cognitive Status: Impaired/Different from baseline Area of Impairment: Attention;Following commands;Safety/judgement;Awareness;Problem solving                   Current Attention Level: Focused Memory: Decreased short-term memory Following Commands: Follows one step commands with increased time;Follows one step commands inconsistently Safety/Judgement: Decreased awareness of safety;Decreased awareness of deficits   Problem Solving: Slow processing;Difficulty sequencing;Requires verbal cues;Decreased initiation;Requires tactile cues General Comments: following 1 step commands approx 50% of the time given multimodal cues to do so, pt attempting to get out of recliner end of session without assist       Exercises      General Comments  Pertinent Vitals/Pain Pain Assessment: Faces Faces Pain Scale: No hurt Pain Location: RUE with movement    Home Living                      Prior Function            PT Goals (current goals can now be found in the care plan section) Acute Rehab PT Goals Patient Stated Goal: none stated Potential to Achieve Goals: Fair Progress  towards PT goals: Progressing toward goals    Frequency    Min 2X/week      PT Plan Current plan remains appropriate    Co-evaluation              AM-PAC PT "6 Clicks" Mobility   Outcome Measure  Help needed turning from your back to your side while in a flat bed without using bedrails?: A Lot Help needed moving from lying on your back to sitting on the side of a flat bed without using bedrails?: A Lot Help needed moving to and from a bed to a chair (including a wheelchair)?: Total Help needed standing up from a chair using your arms (e.g., wheelchair or bedside chair)?: Total Help needed to walk in hospital room?: Total Help needed climbing 3-5 steps with a railing? : Total 6 Click Score: 8    End of Session Equipment Utilized During Treatment: Gait belt Activity Tolerance: Patient tolerated treatment well Patient left: in chair;with chair alarm set Nurse Communication: Mobility status PT Visit Diagnosis: Muscle weakness (generalized) (M62.81);Hemiplegia and hemiparesis;Other abnormalities of gait and mobility (R26.89) Hemiplegia - Right/Left: Right Hemiplegia - dominant/non-dominant: Dominant Hemiplegia - caused by: Nontraumatic intracerebral hemorrhage     Time: [id number] PT Time Calculation (min) (ACUTE ONLY): 26 min  Charges:  $Therapeutic Activity: 23-37 mins                     [name] , PTA Acute Rehabilitation Services Pager [phone] Office [phone]     Codi Folkerts [name] 01/04/2020, 10:31 AM

## 2020-01-04 NOTE — Discharge Summary (Addendum)
[facility] PROGRESS NOTE    Progress Note  Joshua [name]  [id number] DOB: 07-22-1981 DOA: 09/09/2019 PCP: Patient, [name]     Brief Narrative:   Joshua [name] is an 39 y.o. male admitted by neurosurgery on 09/09/2019 for seizures right hemiplegia and decreased responsiveness was found to have a left hemispheric intracranial hemorrhage intubated and admitted to the ICU underwent craniotomy for decompression on 09/09/2019 by Dr. Vertell Limber with a slow neurological recovery and prolonged hospitalization due to seizure and aspiration pneumonia continues to be awake but significantly dysphagia. A PEG tube was placed on 09/25/2019 disposition continues to be challenging as he is uninsured and needs long-term placement.  Assessment/Plan:   Left ICH (intracerebral hemorrhage) ([place]) Patient underwent decompressive craniotomy and hematoma evacuation on 09/09/2019 it was felt that it was likely due to uncontrolled hypertension, CTA did not showed vascular malformations. He still is aphasic and nonverbal with right hemiplegia and significant cognitive impairment. Dr. Vertell Limber did not recommend replacement of his call bone due to the lack of progression.  Seizure disorder: Continue Keppra twice daily.  Aspiration pneumonia/dysphagia: Completed course of IV antibiotics PEG tube in place.  Klebsiella UTI: Treated with cefazolin.  Essential hypertension: Continue amlodipine hydralazine and Lopressor.    DVT prophylaxis: lovenox Family Communication:none Disposition Plan/Barrier to D/C: Placement to a long care term facility has been difficult as he is uninsured Code Status:     Code Status Orders  (From admission, onward)         Start     Ordered   09/09/19 0843  Full code  Continuous     09/09/19 0842        Code Status History    This patient has a current code status but no historical code status.   Advance Care Planning Activity        IV Access:    Peripheral  IV   Procedures and diagnostic studies:   No results found.   Medical Consultants:    None.  Anti-Infectives:   None  Subjective:    Joshua [name] nonverbal  Objective:    Vitals:   01/04/20 0026 01/04/20 0434 01/04/20 0506 01/04/20 0818  BP: 117/90 (!) 120/96 114/89 (!) 130/91  Pulse: 77 84 82 87  Resp: 18 18 17 18   Temp: (!) 97.5 F (36.4 C) 98.8 F (37.1 C) (!) 97.5 F (36.4 C) 98.9 F (37.2 C)  TempSrc: Oral Oral Oral Oral  SpO2: 100% 100% 100% 100%  Weight:      Height:       SpO2: 100 % O2 Flow Rate (L/min): 0 L/min FiO2 (%): 40 %  No intake or output data in the 24 hours ending 01/04/20 0924 Filed Weights   12/28/19 0420 12/29/19 0414 01/02/20 0500  Weight: 58.2 kg 58 kg 61 kg    Exam: General exam: In no acute distress. Respiratory system: Good air movement and clear to auscultation. Cardiovascular system: S1 & S2 heard, RRR. No JVD, murmurs, rubs, gallops or clicks.  Gastrointestinal system: Abdomen is nondistended, soft and nontender.  Central nervous system: Alert and oriented. No focal neurological deficits. Extremities: No pedal edema. Skin: No rashes, lesions or ulcers Psychiatry: Judgement and insight appear normal. Mood & affect appropriate.    Data Reviewed:    Labs: Basic Metabolic Panel: No results for input(s): NA, K, CL, CO2, GLUCOSE, BUN, CREATININE, CALCIUM, MG, PHOS in the last 168 hours. GFR Estimated Creatinine Clearance: 61.4 mL/min (A) (by C-G formula based on  SCr of 1.38 mg/dL (H)). Liver Function Tests: No results for input(s): AST, ALT, ALKPHOS, BILITOT, PROT, ALBUMIN in the last 168 hours. No results for input(s): LIPASE, AMYLASE in the last 168 hours. No results for input(s): AMMONIA in the last 168 hours. Coagulation profile No results for input(s): INR, PROTIME in the last 168 hours. COVID-19 Labs  No results for input(s): DDIMER, FERRITIN, LDH, CRP in the last 72 hours.  Lab Results  Component Value  Date   [place] NEGATIVE 09/09/2019    CBC: No results for input(s): WBC, NEUTROABS, HGB, HCT, MCV, PLT in the last 168 hours. Cardiac Enzymes: No results for input(s): CKTOTAL, CKMB, CKMBINDEX, TROPONINI in the last 168 hours. BNP (last 3 results) No results for input(s): PROBNP in the last 8760 hours. CBG: Recent Labs  Lab 01/04/20 0117 01/04/20 0359 01/04/20 0400 01/04/20 0455 01/04/20 0821  GLUCAP 193* 69* 69* 106* 95   D-Dimer: No results for input(s): DDIMER in the last 72 hours. Hgb A1c: No results for input(s): HGBA1C in the last 72 hours. Lipid Profile: No results for input(s): CHOL, HDL, LDLCALC, TRIG, CHOLHDL, LDLDIRECT in the last 72 hours. Thyroid function studies: No results for input(s): TSH, T4TOTAL, T3FREE, THYROIDAB in the last 72 hours.  Invalid input(s): FREET3 Anemia work up: No results for input(s): VITAMINB12, FOLATE, FERRITIN, TIBC, IRON, RETICCTPCT in the last 72 hours. Sepsis Labs: No results for input(s): PROCALCITON, WBC, LATICACIDVEN in the last 168 hours. Microbiology No results found for this or any previous visit (from the past 240 hour(s)).   Medications:   . amLODipine  10 mg Per Tube Daily  . chlorhexidine  15 mL Mouth Rinse BID  . enoxaparin (LOVENOX) injection  40 mg Subcutaneous Daily  . feeding supplement (OSMOLITE 1.2 CAL)  474 mL Per Tube QID  . feeding supplement (PRO-STAT SUGAR FREE 64)  30 mL Per Tube BID  . folic acid  1 mg Per Tube Daily  . free water  300 mL Per Tube Q4H  . hydrALAZINE  25 mg Per Tube Q8H  . insulin aspart  0-15 Units Subcutaneous Q4H  . levETIRAcetam  500 mg Per Tube BID  . mouth rinse  15 mL Mouth Rinse q12n4p  . metoprolol tartrate  100 mg Per Tube BID  . multivitamin  15 mL Per Tube Daily  . pantoprazole sodium  40 mg Per Tube BID  . QUEtiapine  50 mg Per Tube QHS  . thiamine  100 mg Per Tube Daily   Continuous Infusions:   LOS: 117 days   Charlynne Cousins  [facility]  01/04/2020, 9:24 AM

## 2020-01-04 NOTE — Progress Notes (Signed)
Hypoglycemic Event  CBG: 69  Treatment: Orange juice per tube Symptoms: None  Follow-up CBG: Time:0400 CBG Result:69  Possible Reasons for Event: 3 units of insuline administered at 0124 Comments/MD notified: APP M. Faythe Casa A Essien-Akpan

## 2020-01-05 DIAGNOSIS — I611 Nontraumatic intracerebral hemorrhage in hemisphere, cortical: Secondary | ICD-10-CM | POA: Diagnosis not present

## 2020-01-05 DIAGNOSIS — J9601 Acute respiratory failure with hypoxia: Secondary | ICD-10-CM | POA: Diagnosis not present

## 2020-01-05 LAB — GLUCOSE, CAPILLARY
Glucose-Capillary: 75 mg/dL (ref 70–99)
Glucose-Capillary: 90 mg/dL (ref 70–99)
Glucose-Capillary: 110 mg/dL — ABNORMAL HIGH (ref 70–99)
Glucose-Capillary: 105 mg/dL — ABNORMAL HIGH (ref 70–99)
Glucose-Capillary: 116 mg/dL — ABNORMAL HIGH (ref 70–99)

## 2020-01-05 NOTE — Discharge Summary (Signed)
[facility] PROGRESS NOTE    Progress Note  Leif Ballek  [id number] DOB: [birth year] DOA: 09/09/2019 PCP: Patient, [name]     Brief Narrative:   Chappell Sartini is an 39 y.o. male admitted by neurosurgery on 09/09/2019 for seizures right hemiplegia and decreased responsiveness was found to have a left hemispheric intracranial hemorrhage intubated and admitted to the ICU underwent craniotomy for decompression on 09/09/2019 by Dr. Vertell Limber with a slow neurological recovery and prolonged hospitalization due to seizure and aspiration pneumonia continues to be awake but significantly dysphagia. A PEG tube was placed on 09/25/2019 disposition continues to be challenging as he is uninsured and needs long-term placement.  Assessment/Plan:   Left ICH (intracerebral hemorrhage) ([place]) Patient underwent decompressive craniotomy and hematoma evacuation on 09/09/2019 it was felt that it was likely due to uncontrolled hypertension, CTA did not showed vascular malformations. He still is aphasic and nonverbal with right hemiplegia and significant cognitive impairment. Dr. Vertell Limber did not recommend replacement of his call bone due to the lack of progression. Patient is awaiting placement to skilled nursing facility awaiting to hear from social worker.  Seizure disorder: Continue Keppra twice daily.  Aspiration pneumonia/dysphagia: Completed course of IV antibiotics PEG tube in place.  Klebsiella UTI: Treated with cefazolin.  Essential hypertension: Continue amlodipine hydralazine and Lopressor.    DVT prophylaxis: lovenox Family Communication:none Disposition Plan/Barrier to D/C: Placement to a long care term facility has been difficult as he is uninsured Code Status:     Code Status Orders  (From admission, onward)         Start     Ordered   09/09/19 0843  Full code  Continuous     09/09/19 0842        Code Status History    This patient has a current code status but no  historical code status.   Advance Care Planning Activity        IV Access:    Peripheral IV   Procedures and diagnostic studies:   No results found.   Medical Consultants:    None.  Anti-Infectives:   None  Subjective:    Harvel Eisenman nonverbal  Objective:    Vitals:   01/04/20 1956 01/05/20 0441 01/05/20 0648 01/05/20 0822  BP: 122/74 107/73  113/82  Pulse: 92 79  85  Resp: 18 18  14   Temp: 98.4 F (36.9 C) 97.8 F (36.6 C)  98.3 F (36.8 C)  TempSrc: Oral Oral  Oral  SpO2: 98% 100%  98%  Weight:   61.2 kg   Height:       SpO2: 98 % O2 Flow Rate (L/min): 0 L/min FiO2 (%): 40 %  No intake or output data in the 24 hours ending 01/05/20 1032 Filed Weights   12/29/19 0414 01/02/20 0500 01/05/20 0648  Weight: 58 kg 61 kg 61.2 kg    Exam: General exam: In no acute distress. Respiratory system: Good air movement and clear to auscultation. Cardiovascular system: S1 & S2 heard, RRR. No JVD, murmurs, rubs, gallops or clicks.  Gastrointestinal system: Abdomen is nondistended, soft and nontender.  Central nervous system: Alert and oriented. No focal neurological deficits. Extremities: No pedal edema. Skin: No rashes, lesions or ulcers Psychiatry: Judgement and insight appear normal. Mood & affect appropriate.    Data Reviewed:    Labs: Basic Metabolic Panel: No results for input(s): NA, K, CL, CO2, GLUCOSE, BUN, CREATININE, CALCIUM, MG, PHOS in the last 168 hours. GFR Estimated Creatinine Clearance:  61.6 mL/min (A) (by C-G formula based on SCr of 1.38 mg/dL (H)). Liver Function Tests: No results for input(s): AST, ALT, ALKPHOS, BILITOT, PROT, ALBUMIN in the last 168 hours. No results for input(s): LIPASE, AMYLASE in the last 168 hours. No results for input(s): AMMONIA in the last 168 hours. Coagulation profile No results for input(s): INR, PROTIME in the last 168 hours. COVID-19 Labs  No results for input(s): DDIMER, FERRITIN, LDH, CRP in  the last 72 hours.  Lab Results  Component Value Date   Frannie NEGATIVE 09/09/2019    CBC: No results for input(s): WBC, NEUTROABS, HGB, HCT, MCV, PLT in the last 168 hours. Cardiac Enzymes: No results for input(s): CKTOTAL, CKMB, CKMBINDEX, TROPONINI in the last 168 hours. BNP (last 3 results) No results for input(s): PROBNP in the last 8760 hours. CBG: Recent Labs  Lab 01/04/20 1135 01/04/20 1649 01/04/20 1959 01/05/20 0433 01/05/20 0825  GLUCAP 128* 106* 94 75 90   D-Dimer: No results for input(s): DDIMER in the last 72 hours. Hgb A1c: No results for input(s): HGBA1C in the last 72 hours. Lipid Profile: No results for input(s): CHOL, HDL, LDLCALC, TRIG, CHOLHDL, LDLDIRECT in the last 72 hours. Thyroid function studies: No results for input(s): TSH, T4TOTAL, T3FREE, THYROIDAB in the last 72 hours.  Invalid input(s): FREET3 Anemia work up: No results for input(s): VITAMINB12, FOLATE, FERRITIN, TIBC, IRON, RETICCTPCT in the last 72 hours. Sepsis Labs: No results for input(s): PROCALCITON, WBC, LATICACIDVEN in the last 168 hours. Microbiology No results found for this or any previous visit (from the past 240 hour(s)).   Medications:   . amLODipine  10 mg Per Tube Daily  . chlorhexidine  15 mL Mouth Rinse BID  . enoxaparin (LOVENOX) injection  40 mg Subcutaneous Daily  . feeding supplement (OSMOLITE 1.2 CAL)  474 mL Per Tube QID  . feeding supplement (PRO-STAT SUGAR FREE 64)  30 mL Per Tube BID  . folic acid  1 mg Per Tube Daily  . free water  300 mL Per Tube Q4H  . hydrALAZINE  25 mg Per Tube Q8H  . insulin aspart  0-15 Units Subcutaneous Q4H  . levETIRAcetam  500 mg Per Tube BID  . mouth rinse  15 mL Mouth Rinse q12n4p  . metoprolol tartrate  100 mg Per Tube BID  . multivitamin  15 mL Per Tube Daily  . pantoprazole sodium  40 mg Per Tube BID  . QUEtiapine  50 mg Per Tube QHS  . thiamine  100 mg Per Tube Daily   Continuous Infusions:   LOS: 118 days     Charlynne Cousins  [facility]  01/05/2020, 10:32 AM

## 2020-01-06 DIAGNOSIS — N179 Acute kidney failure, unspecified: Secondary | ICD-10-CM | POA: Diagnosis not present

## 2020-01-06 DIAGNOSIS — I619 Nontraumatic intracerebral hemorrhage, unspecified: Secondary | ICD-10-CM | POA: Diagnosis not present

## 2020-01-06 DIAGNOSIS — I611 Nontraumatic intracerebral hemorrhage in hemisphere, cortical: Secondary | ICD-10-CM | POA: Diagnosis not present

## 2020-01-06 DIAGNOSIS — J9601 Acute respiratory failure with hypoxia: Secondary | ICD-10-CM | POA: Diagnosis not present

## 2020-01-06 LAB — GLUCOSE, CAPILLARY
Glucose-Capillary: 96 mg/dL (ref 70–99)
Glucose-Capillary: 84 mg/dL (ref 70–99)
Glucose-Capillary: 88 mg/dL (ref 70–99)
Glucose-Capillary: 89 mg/dL (ref 70–99)
Glucose-Capillary: 92 mg/dL (ref 70–99)
Glucose-Capillary: 154 mg/dL — ABNORMAL HIGH (ref 70–99)

## 2020-01-06 NOTE — Progress Notes (Signed)
PROGRESS NOTE    Joshua [name]  [id number] DOB: [birth year] DOA: 09/09/2019 PCP: Patient, [name]   Brief Narrative:  Joshua [name] is an 39 y.o. male admitted by neurosurgery on 09/09/2019 for seizures right hemiplegia and decreased responsiveness was found to have a left hemispheric intracranial hemorrhage intubated and admitted to the ICU underwent craniotomy for decompression on 09/09/2019 by Dr. Vertell Limber with a slow neurological recovery and prolonged hospitalization due to seizure and aspiration pneumonia continues to be awake but significantly dysphagia. A PEG tube was placed on 09/25/2019 disposition continues to be challenging as he is uninsured and needs long-term placement.   Assessment & Plan:   Principal Problem:   ICH (intracerebral hemorrhage) (HCC) Active Problems:   Subdural hematoma (HCC)   Intracerebral hemorrhage (HCC)   Cytotoxic brain edema (HCC)   Hemorrhagic stroke (HCC)   Acute respiratory failure ([place])   Essential hypertension   Severe protein-calorie malnutrition (HCC)   CKD (chronic kidney disease), stage IV (HCC)   Seizure ([place])   AKI (acute kidney injury) ([place])   Left ICH (intracerebral hemorrhage) ([place]) Patient underwent decompressive craniotomy and hematoma evacuation on 09/09/2019 it was felt that it was likely due to uncontrolled hypertension, CTA did not showed vascular malformations. He still is aphasic and nonverbal with right hemiplegia and significant cognitive impairment. Dr. Vertell Limber did not recommend replacement of his call bone due to the lack of progression. Patient is still awaiting placement to skilled nursing facility awaiting to hear from social worker.  Seizure disorder: Continue Keppra twice daily.  Aspiration pneumonia/dysphagia: Completed course of IV antibiotics PEG tube in place.  Klebsiella UTI: Treated with cefazolin.  Essential hypertension: Continue amlodipine hydralazine and Lopressor.  DVT prophylaxis: Lovenox  SQ  Code Status: full    Code Status Orders  (From admission, onward)         Start     Ordered   09/09/19 0843  Full code  Continuous     09/09/19 0842        Code Status History    This patient has a current code status but no historical code status.   Advance Care Planning Activity     Family Communication: none today Disposition Plan:   Placement to a long care term facility has been difficult as he is uninsured, pt is unsafe for discharge and unable to safely perform ADL's Code Status: full Consults called: None Admission status: Inpatient   Consultants:   neurology  Procedures:   No results found.  Antimicrobials:   None currently   Subjective: No acute events overnight Resting comfortably in bed  Objective: Vitals:   01/06/20 0000 01/06/20 0400 01/06/20 0817 01/06/20 1207  BP: 118/78  108/86 (!) 113/93  Pulse: 92  89 88  Resp: 18 18 17 16   Temp: 98.9 F (37.2 C) 98.8 F (37.1 C) (!) 97.5 F (36.4 C) 98.5 F (36.9 C)  TempSrc: Axillary Axillary Oral Oral  SpO2: 100% 100% 100% 100%  Weight:      Height:        Intake/Output Summary (Last 24 hours) at 01/06/2020 1243 Last data filed at 01/06/2020 0910 Gross per 24 hour  Intake 60 ml  Output --  Net 60 ml   Filed Weights   12/29/19 0414 01/02/20 0500 01/05/20 0648  Weight: 58 kg 61 kg 61.2 kg    Examination:  General exam: Appears calm and comfortable  Respiratory system: Clear to auscultation. Respiratory effort normal. Cardiovascular system: S1 & S2 heard,  RRR. No JVD, murmurs, rubs, gallops or clicks. No pedal edema. Gastrointestinal system: Abdomen is nondistended, soft and nontender. No organomegaly or masses felt. Normal bowel sounds heard. Central nervous system: Alert, aphasic. Extremities: no edema Skin: No rashes, lesions or ulcers Psychiatry: Judgement and insight are impaired. Mood & affect flat    Data Reviewed: I have personally reviewed following labs and imaging  studies  CBC: No results for input(s): WBC, NEUTROABS, HGB, HCT, MCV, PLT in the last 168 hours. Basic Metabolic Panel: No results for input(s): NA, K, CL, CO2, GLUCOSE, BUN, CREATININE, CALCIUM, MG, PHOS in the last 168 hours. GFR: Estimated Creatinine Clearance: 61.6 mL/min (A) (by C-G formula based on SCr of 1.38 mg/dL (H)). Liver Function Tests: No results for input(s): AST, ALT, ALKPHOS, BILITOT, PROT, ALBUMIN in the last 168 hours. No results for input(s): LIPASE, AMYLASE in the last 168 hours. No results for input(s): AMMONIA in the last 168 hours. Coagulation Profile: No results for input(s): INR, PROTIME in the last 168 hours. Cardiac Enzymes: No results for input(s): CKTOTAL, CKMB, CKMBINDEX, TROPONINI in the last 168 hours. BNP (last 3 results) No results for input(s): PROBNP in the last 8760 hours. HbA1C: No results for input(s): HGBA1C in the last 72 hours. CBG: Recent Labs  Lab 01/05/20 1626 01/05/20 2052 01/05/20 2349 01/06/20 0817 01/06/20 1158  GLUCAP 105* 116* 96 84 88   Lipid Profile: No results for input(s): CHOL, HDL, LDLCALC, TRIG, CHOLHDL, LDLDIRECT in the last 72 hours. Thyroid Function Tests: No results for input(s): TSH, T4TOTAL, FREET4, T3FREE, THYROIDAB in the last 72 hours. Anemia Panel: No results for input(s): VITAMINB12, FOLATE, FERRITIN, TIBC, IRON, RETICCTPCT in the last 72 hours. Sepsis Labs: No results for input(s): PROCALCITON, LATICACIDVEN in the last 168 hours.  No results found for this or any previous visit (from the past 240 hour(s)).       Radiology Studies: No results found.      Scheduled Meds: . amLODipine  10 mg Per Tube Daily  . chlorhexidine  15 mL Mouth Rinse BID  . enoxaparin (LOVENOX) injection  40 mg Subcutaneous Daily  . feeding supplement (OSMOLITE 1.2 CAL)  474 mL Per Tube QID  . feeding supplement (PRO-STAT SUGAR FREE 64)  30 mL Per Tube BID  . folic acid  1 mg Per Tube Daily  . free water  300 mL Per  Tube Q4H  . hydrALAZINE  25 mg Per Tube Q8H  . insulin aspart  0-15 Units Subcutaneous Q4H  . levETIRAcetam  500 mg Per Tube BID  . mouth rinse  15 mL Mouth Rinse q12n4p  . metoprolol tartrate  100 mg Per Tube BID  . multivitamin  15 mL Per Tube Daily  . pantoprazole sodium  40 mg Per Tube BID  . QUEtiapine  50 mg Per Tube QHS  . thiamine  100 mg Per Tube Daily   Continuous Infusions:   LOS: 119 days    Time spent: 35 min    Nicolette Bang, MD [facility]  If 7PM-7AM, please contact night-coverage  01/06/2020, 12:43 PM

## 2020-01-06 NOTE — Plan of Care (Signed)
  Problem: Pain Managment: Goal: General experience of comfort will improve Outcome: Progressing   

## 2020-01-07 DIAGNOSIS — I611 Nontraumatic intracerebral hemorrhage in hemisphere, cortical: Secondary | ICD-10-CM | POA: Diagnosis not present

## 2020-01-07 DIAGNOSIS — J9601 Acute respiratory failure with hypoxia: Secondary | ICD-10-CM | POA: Diagnosis not present

## 2020-01-07 DIAGNOSIS — I619 Nontraumatic intracerebral hemorrhage, unspecified: Secondary | ICD-10-CM | POA: Diagnosis not present

## 2020-01-07 DIAGNOSIS — N179 Acute kidney failure, unspecified: Secondary | ICD-10-CM | POA: Diagnosis not present

## 2020-01-07 LAB — GLUCOSE, CAPILLARY
Glucose-Capillary: 69 mg/dL — ABNORMAL LOW (ref 70–99)
Glucose-Capillary: 99 mg/dL (ref 70–99)
Glucose-Capillary: 104 mg/dL — ABNORMAL HIGH (ref 70–99)
Glucose-Capillary: 102 mg/dL — ABNORMAL HIGH (ref 70–99)
Glucose-Capillary: 144 mg/dL — ABNORMAL HIGH (ref 70–99)
Glucose-Capillary: 110 mg/dL — ABNORMAL HIGH (ref 70–99)

## 2020-01-07 MED ORDER — LEVETIRACETAM IN NACL 500 MG/100ML IV SOLN
500.0000 mg | Freq: Two times a day (BID) | INTRAVENOUS | Status: AC
  Administered 2020-01-07: 500 mg via INTRAVENOUS
  Filled 2020-01-07: qty 100

## 2020-01-07 MED ORDER — HYDRALAZINE HCL 20 MG/ML IJ SOLN: 20.0000 mg | Freq: Three times a day (TID) | INTRAMUSCULAR | Status: DC | PRN

## 2020-01-07 MED ORDER — PROMETHAZINE HCL 25 MG/ML IJ SOLN: 12.5000 mg | Freq: Four times a day (QID) | INTRAMUSCULAR | Status: DC | PRN

## 2020-01-07 MED ORDER — METOPROLOL TARTRATE 5 MG/5ML IV SOLN
5.0000 mg | Freq: Two times a day (BID) | INTRAVENOUS | Status: AC
  Administered 2020-01-07: 5 mg via INTRAVENOUS
  Filled 2020-01-07: qty 5

## 2020-01-07 MED ORDER — PANTOPRAZOLE SODIUM 40 MG IV SOLR
40.0000 mg | INTRAVENOUS | Status: AC
  Administered 2020-01-07: 40 mg via INTRAVENOUS
  Filled 2020-01-07: qty 40

## 2020-01-07 NOTE — Progress Notes (Signed)
Paged on-call provider via Amion to request meds to be changed to IV dosing d/t pt inability to tolerate via tube or PO.

## 2020-01-07 NOTE — Plan of Care (Signed)
  Problem: Nutrition: Goal: Adequate nutrition will be maintained Outcome: Not Progressing   

## 2020-01-07 NOTE — Progress Notes (Signed)
Hypoglycemic Event  CBG: *69  Treatment: 4oz of orange juice via peg tube  Symptoms: none  Follow-up CBG: Time: CBG Result:  Possible Reasons for Event: unknown Comments/MD notified:on call notified; Dr. Silas [place] notified    Joshua [name]

## 2020-01-07 NOTE — Progress Notes (Signed)
PT vomited a large amount , shortly after tube feeding. PT was cleaned up. MD notified, and advised to make PT NPO, hold tube feedings and consult speech therapy. Will continue to monitor.

## 2020-01-07 NOTE — Progress Notes (Signed)
PROGRESS NOTE    Joshua [name]  [id number] DOB: 24-Sep-1981 DOA: 09/09/2019 PCP: Patient, [name]   Brief Narrative:  Joshua [name] an 39 y.o.maleadmitted by neurosurgery on 09/09/2019 for seizures right hemiplegia and decreased responsiveness was found to have a left hemispheric intracranial hemorrhage intubated and admitted to the ICU underwent craniotomy for decompression on 09/09/2019 by Dr. Vertell Limber with a slow neurological recovery and prolonged hospitalization due to seizure and aspiration pneumonia continues to be awake but significantly dysphagia. A PEG tube was placed on 09/25/2019 disposition continues to be challenging as he is uninsured and needs long-term placement.   Assessment & Plan:   Principal Problem:   ICH (intracerebral hemorrhage) (HCC) Active Problems:   Subdural hematoma (HCC)   Intracerebral hemorrhage (HCC)   Cytotoxic brain edema (HCC)   Hemorrhagic stroke (HCC)   Acute respiratory failure ([place])   Essential hypertension   Severe protein-calorie malnutrition (HCC)   CKD (chronic kidney disease), stage IV (HCC)   Seizure ([place])   AKI (acute kidney injury) ([place])   Left ICH (intracerebral hemorrhage) ([place]) Patient underwent decompressive craniotomy and hematoma evacuation on 09/09/2019 it was felt that it was likely due to uncontrolled hypertension, CTA did not showed vascular malformations. He still is aphasic and nonverbal with right hemiplegia and significant cognitive impairment. Dr. Vertell Limber did not recommend replacement of his call bone due to the lack of progression. Patient is still awaiting placement to skilled nursing facility awaiting to hear from social worker.  Seizure disorder: Continue Keppra twice daily.  Aspiration pneumonia/dysphagia: Completed course of IV antibiotics PEG tube in place.  Klebsiella UTI: Treated with cefazolin.  Essential hypertension: Continue amlodipine hydralazine and Lopressor.  DVT prophylaxis: Lovenox  SQ  Code Status: full    Code Status Orders  (From admission, onward)         Start     Ordered   09/09/19 0843  Full code  Continuous     09/09/19 0842        Code Status History    This patient has a current code status but no historical code status.   Advance Care Planning Activity     Family Communication: none today  Disposition Plan:   Placement to a long-term care facility is been difficult as he is unsure, patient is unsafe discharge unable to safely perform ADLs Consults called: None Admission status: Inpatient   Consultants:   Neurology  Procedures:   No results found.  Antimicrobials:   None currently   Subjective: No acute events overnight Remained stable resting in bed comfortably  Objective: Vitals:   01/06/20 1956 01/07/20 0356 01/07/20 0417 01/07/20 0808  BP: (!) 116/95 119/89  (!) 111/91  Pulse: 85 87  83  Resp: 18 20  16   Temp: 97.6 F (36.4 C) 97.9 F (36.6 C)  98.5 F (36.9 C)  TempSrc: Oral Oral  Oral  SpO2: 100% 98%  100%  Weight:   58.1 kg   Height:        Intake/Output Summary (Last 24 hours) at 01/07/2020 1138 Last data filed at 01/07/2020 0845 Gross per 24 hour  Intake 0 ml  Output -  Net 0 ml   Filed Weights   01/02/20 0500 01/05/20 0648 01/07/20 0417  Weight: 61 kg 61.2 kg 58.1 kg    Examination: General exam: Appears calm and comfortable  Respiratory system: Clear to auscultation. Respiratory effort normal. Cardiovascular system: S1 & S2 heard, RRR. No JVD, murmurs, rubs, gallops or clicks. No  pedal edema. Gastrointestinal system: Abdomen is nondistended, soft and nontender. No organomegaly or masses felt. Normal bowel sounds heard. Central nervous system: Alert, aphasic. Extremities: no edema Skin: No rashes, lesions or ulcers Psychiatry: Judgement and insight are impaired. Mood & affect flat.     Data Reviewed: I have personally reviewed following labs and imaging studies  CBC: No results for input(s):  WBC, NEUTROABS, HGB, HCT, MCV, PLT in the last 168 hours. Basic Metabolic Panel: No results for input(s): NA, K, CL, CO2, GLUCOSE, BUN, CREATININE, CALCIUM, MG, PHOS in the last 168 hours. GFR: Estimated Creatinine Clearance: 58.5 mL/min (A) (by C-G formula based on SCr of 1.38 mg/dL (H)). Liver Function Tests: No results for input(s): AST, ALT, ALKPHOS, BILITOT, PROT, ALBUMIN in the last 168 hours. No results for input(s): LIPASE, AMYLASE in the last 168 hours. No results for input(s): AMMONIA in the last 168 hours. Coagulation Profile: No results for input(s): INR, PROTIME in the last 168 hours. Cardiac Enzymes: No results for input(s): CKTOTAL, CKMB, CKMBINDEX, TROPONINI in the last 168 hours. BNP (last 3 results) No results for input(s): PROBNP in the last 8760 hours. HbA1C: No results for input(s): HGBA1C in the last 72 hours. CBG: Recent Labs  Lab 01/06/20 1933 01/06/20 2317 01/07/20 0349 01/07/20 0415 01/07/20 0811  GLUCAP 92 154* 69* 99 104*   Lipid Profile: No results for input(s): CHOL, HDL, LDLCALC, TRIG, CHOLHDL, LDLDIRECT in the last 72 hours. Thyroid Function Tests: No results for input(s): TSH, T4TOTAL, FREET4, T3FREE, THYROIDAB in the last 72 hours. Anemia Panel: No results for input(s): VITAMINB12, FOLATE, FERRITIN, TIBC, IRON, RETICCTPCT in the last 72 hours. Sepsis Labs: No results for input(s): PROCALCITON, LATICACIDVEN in the last 168 hours.  No results found for this or any previous visit (from the past 240 hour(s)).       Radiology Studies: No results found.      Scheduled Meds: . amLODipine  10 mg Per Tube Daily  . chlorhexidine  15 mL Mouth Rinse BID  . enoxaparin (LOVENOX) injection  40 mg Subcutaneous Daily  . feeding supplement (OSMOLITE 1.2 CAL)  474 mL Per Tube QID  . feeding supplement (PRO-STAT SUGAR FREE 64)  30 mL Per Tube BID  . folic acid  1 mg Per Tube Daily  . free water  300 mL Per Tube Q4H  . hydrALAZINE  25 mg Per Tube  Q8H  . insulin aspart  0-15 Units Subcutaneous Q4H  . levETIRAcetam  500 mg Per Tube BID  . mouth rinse  15 mL Mouth Rinse q12n4p  . metoprolol tartrate  100 mg Per Tube BID  . multivitamin  15 mL Per Tube Daily  . pantoprazole sodium  40 mg Per Tube BID  . QUEtiapine  50 mg Per Tube QHS  . thiamine  100 mg Per Tube Daily   Continuous Infusions:   LOS: 120 days    Time spent: 35 min    Nicolette Bang, MD [facility]  If 7PM-7AM, please contact night-coverage  01/07/2020, 11:38 AM

## 2020-01-08 ENCOUNTER — Inpatient Hospital Stay (HOSPITAL_COMMUNITY): Payer: Medicaid Other

## 2020-01-08 DIAGNOSIS — I611 Nontraumatic intracerebral hemorrhage in hemisphere, cortical: Secondary | ICD-10-CM | POA: Diagnosis not present

## 2020-01-08 LAB — BASIC METABOLIC PANEL
Anion gap: 12 (ref 5–15)
BUN: 33 mg/dL — ABNORMAL HIGH (ref 6–20)
CO2: 28 mmol/L (ref 22–32)
Calcium: 10.5 mg/dL — ABNORMAL HIGH (ref 8.9–10.3)
Chloride: 102 mmol/L (ref 98–111)
Creatinine, Ser: 1.55 mg/dL — ABNORMAL HIGH (ref 0.61–1.24)
GFR calc Af Amer: >60 mL/min (ref >60)
GFR calc non Af Amer: 55 mL/min — ABNORMAL LOW (ref >60)
Glucose, Bld: 100 mg/dL — ABNORMAL HIGH (ref 70–99)
Potassium: 4.3 mmol/L (ref 3.5–5.1)
Sodium: 142 mmol/L (ref 135–145)

## 2020-01-08 LAB — GLUCOSE, CAPILLARY
Glucose-Capillary: 82 mg/dL (ref 70–99)
Glucose-Capillary: 91 mg/dL (ref 70–99)
Glucose-Capillary: 91 mg/dL (ref 70–99)
Glucose-Capillary: 92 mg/dL (ref 70–99)
Glucose-Capillary: 93 mg/dL (ref 70–99)
Glucose-Capillary: 97 mg/dL (ref 70–99)
Glucose-Capillary: 98 mg/dL (ref 70–99)
Glucose-Capillary: 99 mg/dL (ref 70–99)

## 2020-01-08 LAB — CBC
HCT: 39 % (ref 39.0–52.0)
Hemoglobin: 12.3 g/dL — ABNORMAL LOW (ref 13.0–17.0)
MCH: 26.1 pg (ref 26.0–34.0)
MCHC: 31.5 g/dL (ref 30.0–36.0)
MCV: 82.8 fL (ref 80.0–100.0)
Platelets: 338 10*3/uL (ref 150–400)
RBC: 4.71 MIL/uL (ref 4.22–5.81)
RDW: 14.6 % (ref 11.5–15.5)
WBC: 7.9 10*3/uL (ref 4.0–10.5)
nRBC: 0 % (ref 0.0–0.2)

## 2020-01-08 MED ORDER — SODIUM CHLORIDE 0.9 % IV SOLN: INTRAVENOUS | Status: DC

## 2020-01-08 MED ORDER — IOHEXOL 300 MG/ML  SOLN
100.0000 mL | Freq: Once | INTRAMUSCULAR | Status: AC | PRN
  Administered 2020-01-08: 100 mL via INTRAVENOUS

## 2020-01-08 MED ORDER — LEVETIRACETAM IN NACL 500 MG/100ML IV SOLN
500.0000 mg | Freq: Two times a day (BID) | INTRAVENOUS | Status: DC
  Administered 2020-01-08 – 2020-01-10 (×4): 500 mg via INTRAVENOUS
  Filled 2020-01-08 (×4): qty 100

## 2020-01-08 NOTE — Plan of Care (Signed)
  Problem: Education: Goal: Knowledge of General Education information will improve Description: Including pain rating scale, medication(s)/side effects and non-pharmacologic comfort measures Outcome: Progressing   Problem: Health Behavior/Discharge Planning: Goal: Ability to manage health-related needs will improve Outcome: Progressing   Problem: Clinical Measurements: Goal: Will remain free from infection Outcome: Progressing Goal: Diagnostic test results will improve Outcome: Progressing Goal: Respiratory complications will improve Outcome: Progressing Goal: Cardiovascular complication will be avoided Outcome: Progressing   Problem: Activity: Goal: Risk for activity intolerance will decrease Outcome: Progressing   Problem: Nutrition: Goal: Adequate nutrition will be maintained Outcome: Progressing   Problem: Coping: Goal: Level of anxiety will decrease Outcome: Progressing   Problem: Elimination: Goal: Will not experience complications related to bowel motility Outcome: Progressing Goal: Will not experience complications related to urinary retention Outcome: Progressing   Problem: Pain Managment: Goal: General experience of comfort will improve Outcome: Progressing   Problem: Safety: Goal: Ability to remain free from injury will improve Outcome: Progressing   Problem: Skin Integrity: Goal: Risk for impaired skin integrity will decrease Outcome: Progressing   Problem: Clinical Measurements: Goal: Usual level of consciousness will be regained or maintained. Outcome: Progressing Goal: Neurologic status will improve Outcome: Progressing Goal: Ability to maintain intracranial pressure will improve Outcome: Progressing   Problem: Skin Integrity: Goal: Demonstration of wound healing without infection will improve Outcome: Progressing   Problem: Education: Goal: Knowledge of disease or condition will improve Outcome: Progressing Goal: Knowledge of secondary  prevention will improve Outcome: Progressing Goal: Knowledge of patient specific risk factors addressed and post discharge goals established will improve Outcome: Progressing Goal: Individualized Educational Video(s) Outcome: Progressing   Problem: Coping: Goal: Will verbalize positive feelings about self Outcome: Progressing Goal: Will identify appropriate support needs Outcome: Progressing   Problem: Health Behavior/Discharge Planning: Goal: Ability to manage health-related needs will improve Outcome: Progressing   Problem: Self-Care: Goal: Ability to participate in self-care as condition permits will improve Outcome: Progressing Goal: Ability to communicate needs accurately will improve Outcome: Progressing   Problem: Nutrition: Goal: Risk of aspiration will decrease Outcome: Progressing Goal: Dietary intake will improve Outcome: Progressing   Problem: Intracerebral Hemorrhage Tissue Perfusion: Goal: Complications of Intracerebral Hemorrhage will be minimized Outcome: Progressing

## 2020-01-08 NOTE — TOC Progression Note (Signed)
Transition of Care [facility]) - Progression Note    Patient Details  Name: Joshua [name] MRN: [id number] Date of Birth: 1981/04/10  [facility]) CM/SW Contact  Pollie Friar, RN Phone Number: 01/08/2020, 10:08 AM  Clinical Narrative:    Continue to await disability with his Medicaid. No current bed offers. TOC following.   Expected Discharge Plan: Skilled Nursing Facility Barriers to Discharge: SNF Pending payor source - [facility], SNF Pending bed offer, Inadequate or no insurance  Expected Discharge Plan and Services Expected Discharge Plan: [place] In-house Referral: Clinical Social Work Discharge Planning Services: NA Post Acute Care Choice: [place] Living arrangements for the past 2 months: Apartment                 DME Arranged: N/A DME Agency: NA       HH Arranged: NA HH Agency: NA         Social Determinants of Health (SDOH) Interventions    Readmission Risk Interventions No flowsheet data found.

## 2020-01-08 NOTE — Progress Notes (Signed)
Occupational Therapy Treatment Patient Details Name: Joshua [name] MRN: [id number] DOB: 1981-06-08 Today's Date: 01/08/2020    History of present illness Pt is a 39 y.o. 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 S/p L frontoparietal craniectomy for evacuation of L basal ganglia hematoma 9/12. ETT 9/12-9/18, 9/18-9/23. PEG tube placed 9/28 secondary to dysphagia. PMH includes alcohol abuse, seizures, HTN.   OT comments  Pt making gradual progress towards OT goals this session. Session focus on sitting balance as precursor to higher level ADLs. Pt requires MAX A +2 for bed mobility, but noted to use LUE to attempt to elevate trunk into sitting. Pt with heavy posterior lean in sitting and continues to be resistant to movement or external assist, often moving therapists hands away. Overall, pt requires MAX A for sitting balance with brief periods of close min guard for balance when pt able to position LUE appropriately to stabilize self. Unable to stand as pt continues to lean back posteriorly returning self to supine. DC plan remains appropriate, will follow.    Follow Up Recommendations  SNF;Supervision/Assistance - 24 hour    Equipment Recommendations  None recommended by OT    Recommendations for Other Services      Precautions / Restrictions Precautions Precautions: Fall Precaution Comments: L crani, PEG, helmet Required Braces or Orthoses: Splint/Cast Splint/Cast: resting hand splint and elbow splint for R UE due synergistic pattern Restrictions Weight Bearing Restrictions: No       Mobility Bed Mobility Overal bed mobility: Needs Assistance Bed Mobility: Supine to Sit;Sit to Supine   Sidelying to sit: +2 for physical assistance;Max assist Supine to sit: Max assist;+2 for safety/equipment     General bed mobility comments: pt noted to initiate pulling up on bed rail to elevate trunk using LUE, requiring MAX A +2 overall. unable  to motor planning getting BLEs on floor before elevating trunk  Transfers                 General transfer comment: unable to attempt    Balance Overall balance assessment: Needs assistance Sitting-balance support: Feet supported;Single extremity supported Sitting balance-Leahy Scale: Poor Sitting balance - Comments: reliant on external support needing MAX A, intermittent periods of close min guard with pt using LUE to support UB but unable to sustain for more than 10 seconds Postural control: Right lateral lean;Posterior lean                                 ADL either performed or assessed with clinical judgement   ADL Overall ADL's : Needs assistance/impaired                                     Functional mobility during ADLs: Maximal assistance;+2 for physical assistance General ADL Comments: pt resistant to movement or ADL participation this session but able to sit EOB with MAX A d/t heavy posterior lean     Vision   Vision Assessment?: Vision impaired- to be further tested in functional context   Perception     Praxis      Cognition Arousal/Alertness: Awake/alert Behavior During Therapy: Flat affect Overall Cognitive Status: Impaired/Different from baseline Area of Impairment: Attention;Following commands;Safety/judgement;Awareness;Problem solving                   Current Attention Level:  Focused   Following Commands: Follows one step commands with increased time;Follows one step commands inconsistently Safety/Judgement: Decreased awareness of safety;Decreased awareness of deficits Awareness: Intellectual Problem Solving: Slow processing;Difficulty sequencing;Requires verbal cues;Decreased initiation;Requires tactile cues General Comments: able to follow 1 step commands inconsistently, resistant to all movement this session, therefore difficult to assess whether resistance is related to cognition or fear of falling         Exercises     Shoulder Instructions       General Comments pt girlfriend present throughout session    Pertinent Vitals/ Pain       Pain Assessment: Faces Faces Pain Scale: Hurts little more Pain Location: general discomfort with movement, guarding and resisting assist Pain Descriptors / Indicators: Discomfort;Guarding Pain Intervention(s): Monitored during session;Repositioned  Home Living                                          Prior Functioning/Environment              Frequency  Min 2X/week        Progress Toward Goals  OT Goals(current goals can now be found in the care plan section)  Progress towards OT goals: Progressing toward goals  Acute Rehab OT Goals Patient Stated Goal: none stated OT Goal Formulation: Patient unable to participate in goal setting Time For Goal Achievement: 01/15/20 Potential to Achieve Goals: Good  Plan Discharge plan remains appropriate    Co-evaluation                 AM-PAC OT "6 Clicks" Daily Activity     Outcome Measure   Help from another person eating meals?: A Lot Help from another person taking care of personal grooming?: A Lot Help from another person toileting, which includes using toliet, bedpan, or urinal?: A Lot Help from another person bathing (including washing, rinsing, drying)?: Total Help from another person to put on and taking off regular upper body clothing?: Total Help from another person to put on and taking off regular lower body clothing?: Total 6 Click Score: 9    End of Session    OT Visit Diagnosis: Other abnormalities of gait and mobility (R26.89);Hemiplegia and hemiparesis;Cognitive communication deficit (R41.841) Symptoms and signs involving cognitive functions: Other Nontraumatic ICH Hemiplegia - Right/Left: Right Hemiplegia - caused by: Other Nontraumatic intracranial hemorrhage   Activity Tolerance Patient tolerated treatment well   Patient Left in bed;with  call bell/phone within reach;with family/visitor present   Nurse Communication Mobility status        Time: [id number] OT Time Calculation (min): 19 min  Charges: OT General Charges $OT Visit: 1 Visit OT Treatments $Therapeutic Activity: 8-22 mins  Lanier Clam., COTA/L Acute Rehabilitation Services [phone]    Ihor Gully 01/08/2020, 4:04 PM

## 2020-01-08 NOTE — Progress Notes (Addendum)
SLP Cancellation Note  Patient Details Name: Joshua [name] MRN: [id number] DOB: [birth year]   Cancelled treatment:       Reason Eval/Treat Not Completed:  ST was reconsulted on this pt due to vomiting after tube feeds. Pt has demonstrated the ability to swallow, but refuses PO trials. RN confirms poor po intake has continued, and the vomiting was following tube feeding, not po intake. Will defer BSE at this time, as this does not appear to be an oropharyngeal issue. MD in agreement. Signing off.  Carmela Rima, Ramirez-Perez Speech Language Pathologist Office: [phone] Pager: [phone]  [name] 01/08/2020, 9:18 AM

## 2020-01-08 NOTE — Progress Notes (Addendum)
PROGRESS NOTE    Joshua [name]  [id number] DOB: [birth year] DOA: 09/09/2019 PCP: Patient, [name]   Brief Narrative: 39 year old male admitted by neurosurgery on 09/09/2019 for seizure, right hemiplegia and decreased responsiveness, found to have left hemispheric intracranial hemorrhage, intubated and  admitted to the ICU.  Underwent craniotomy for decompression on 09/09/2019 by Dr. Vertell Limber, patient  with  slow neurological recovery and prolonged hospitalization due to seizure and aspiration pneumonia.  Patient continued to be awake but with significant dysphagia.  PEG tube was placed on 09/25/2019, disposition continue to be challenging as he is uninsured and needs long-term placement.  Patient vomited tube feeding contents the night of 01/06/2019.  And to proceed with KUB.  Hold tube feeding until x-ray available  Assessment & Plan:   Principal Problem:   ICH (intracerebral hemorrhage) (HCC) Active Problems:   Subdural hematoma (HCC)   Intracerebral hemorrhage (HCC)   Cytotoxic brain edema (HCC)   Hemorrhagic stroke (HCC)   Acute respiratory failure ([place])   Essential hypertension   Severe protein-calorie malnutrition ([place])   CKD (chronic kidney disease), stage IV (HCC)   Seizure ([place])   AKI (acute kidney injury) ([place])   1-Left ICA: Patient underwent decompression craniotomy and hematoma evacuation on 09/09/2019, it was felt that he was likely due to uncontrolled hypertension, CTA did not show vascular malformation. Patient is a still aphasic and nonverbal with right hemiplegia and significant cognitive impairment. Dr. Vertell Limber did not recommend a replacement of skull bone.   2- Seizure; continue with Keppra twice daily.  Will change to IV due to vomiting  3-Aspiration pneumonia/dysphagia Completed course of IV antibiotic PEG tube in place  4-Klebsiella UTI: Treated with cefazolin  Hypertension: Continue with amlodipine, hydralazine and Lopressor  AKI on Chronic Kidney  diseases III a;  Cr on admission up to 2.08 Cr decreased to 1.4--12.5 IV fluids while NPO  Vomiting: Tube feeding content. Holding tube feeding. KUB amorphous material right.  Will proceed with CT abdomen.  Fluids.   Nutrition Problem: Severe Malnutrition Etiology: acute illness(intracerebral hemorrage)    Signs/Symptoms: severe fat depletion, severe muscle depletion    Interventions: Tube feeding, Refer to RD note for recommendations  Estimated body mass index is 18.92 kg/m as calculated from the following:   Height as of this encounter: 5\' 9"  (1.753 m).   Weight as of this encounter: 58.1 kg.   DVT prophylaxis: Lovenox Code Status: Full code Family Communication: Family at bedside Disposition Plan: Waiting placement, evaluation for vomiting Consultants:   NeuroSurgery  Procedures:     Antimicrobials:  None currently  Subjective: Patient developed vomiting last night.  He is alert, aphasic.  Objective: Vitals:   01/07/20 2328 01/08/20 0407 01/08/20 0758 01/08/20 1206  BP: 130/90 127/83 (!) 124/94 (!) 129/99  Pulse: 77  87 97  Resp: 16 17 16 17   Temp: 97.9 F (36.6 C) 98.4 F (36.9 C) 98.3 F (36.8 C) 98.8 F (37.1 C)  TempSrc: Oral Axillary Oral Oral  SpO2: 100% 100% 100% 99%  Weight:      Height:        Intake/Output Summary (Last 24 hours) at 01/08/2020 1341 Last data filed at 01/08/2020 0421 Gross per 24 hour  Intake 0 ml  Output 0 ml  Net 0 ml   Filed Weights   01/02/20 0500 01/05/20 0648 01/07/20 0417  Weight: 61 kg 61.2 kg 58.1 kg    Examination:  General exam: Appears calm and comfortable  Respiratory system: Clear to auscultation.  Respiratory effort normal. Cardiovascular system: S1 & S2 heard, RRR. No JVD, murmurs, rubs, gallops or clicks. No pedal edema. Gastrointestinal system: Abdomen is nondistended, soft and tube in place. No organomegaly or masses felt. Normal bowel sounds heard. Central nervous system: Alert not  following commands, aphasic Extremities: Symmetric 5 x 5 power. Skin: No rashes, lesions or ulcers   Data Reviewed: I have personally reviewed following labs and imaging studies  CBC: Recent Labs  Lab 01/08/20 1011  WBC 7.9  HGB 12.3*  HCT 39.0  MCV 82.8  PLT [id number]   Basic Metabolic Panel: Recent Labs  Lab 01/08/20 1011  NA 142  K 4.3  CL 102  CO2 28  GLUCOSE 100*  BUN 33*  CREATININE 1.55*  CALCIUM 10.5*   GFR: Estimated Creatinine Clearance: 52.1 mL/min (A) (by C-G formula based on SCr of 1.55 mg/dL (H)). Liver Function Tests: No results for input(s): AST, ALT, ALKPHOS, BILITOT, PROT, ALBUMIN in the last 168 hours. No results for input(s): LIPASE, AMYLASE in the last 168 hours. No results for input(s): AMMONIA in the last 168 hours. Coagulation Profile: No results for input(s): INR, PROTIME in the last 168 hours. Cardiac Enzymes: No results for input(s): CKTOTAL, CKMB, CKMBINDEX, TROPONINI in the last 168 hours. BNP (last 3 results) No results for input(s): PROBNP in the last 8760 hours. HbA1C: No results for input(s): HGBA1C in the last 72 hours. CBG: Recent Labs  Lab 01/07/20 2334 01/08/20 0344 01/08/20 0354 01/08/20 0800 01/08/20 1209  GLUCAP 93 91 92 91 97   Lipid Profile: No results for input(s): CHOL, HDL, LDLCALC, TRIG, CHOLHDL, LDLDIRECT in the last 72 hours. Thyroid Function Tests: No results for input(s): TSH, T4TOTAL, FREET4, T3FREE, THYROIDAB in the last 72 hours. Anemia Panel: No results for input(s): VITAMINB12, FOLATE, FERRITIN, TIBC, IRON, RETICCTPCT in the last 72 hours. Sepsis Labs: No results for input(s): PROCALCITON, LATICACIDVEN in the last 168 hours.  No results found for this or any previous visit (from the past 240 hour(s)).       Radiology Studies: No results found.      Scheduled Meds: . amLODipine  10 mg Per Tube Daily  . chlorhexidine  15 mL Mouth Rinse BID  . enoxaparin (LOVENOX) injection  40 mg Subcutaneous  Daily  . feeding supplement (OSMOLITE 1.2 CAL)  474 mL Per Tube QID  . feeding supplement (PRO-STAT SUGAR FREE 64)  30 mL Per Tube BID  . folic acid  1 mg Per Tube Daily  . free water  300 mL Per Tube Q4H  . insulin aspart  0-15 Units Subcutaneous Q4H  . mouth rinse  15 mL Mouth Rinse q12n4p  . multivitamin  15 mL Per Tube Daily  . QUEtiapine  50 mg Per Tube QHS  . thiamine  100 mg Per Tube Daily   Continuous Infusions: . sodium chloride       LOS: 121 days    Time spent: 35 minutes    Kent Braunschweig A Marielouise Amey, MD [facility]  If 7PM-7AM, please contact night-coverage [email or web address] Password [facility] 01/08/2020, 1:41 PM

## 2020-01-09 DIAGNOSIS — I611 Nontraumatic intracerebral hemorrhage in hemisphere, cortical: Secondary | ICD-10-CM | POA: Diagnosis not present

## 2020-01-09 LAB — GLUCOSE, CAPILLARY
Glucose-Capillary: 146 mg/dL — ABNORMAL HIGH (ref 70–99)
Glucose-Capillary: 77 mg/dL (ref 70–99)
Glucose-Capillary: 95 mg/dL (ref 70–99)
Glucose-Capillary: 89 mg/dL (ref 70–99)
Glucose-Capillary: 103 mg/dL — ABNORMAL HIGH (ref 70–99)

## 2020-01-09 LAB — BASIC METABOLIC PANEL
Anion gap: 10 (ref 5–15)
BUN: 31 mg/dL — ABNORMAL HIGH (ref 6–20)
CO2: 27 mmol/L (ref 22–32)
Calcium: 10 mg/dL (ref 8.9–10.3)
Chloride: 107 mmol/L (ref 98–111)
Creatinine, Ser: 1.65 mg/dL — ABNORMAL HIGH (ref 0.61–1.24)
GFR calc Af Amer: 59 mL/min — ABNORMAL LOW (ref >60)
GFR calc non Af Amer: 51 mL/min — ABNORMAL LOW (ref >60)
Glucose, Bld: 82 mg/dL (ref 70–99)
Potassium: 4 mmol/L (ref 3.5–5.1)
Sodium: 144 mmol/L (ref 135–145)

## 2020-01-09 MED ORDER — OSMOLITE 1.2 CAL PO LIQD
1000.0000 mL | ORAL | Status: DC
  Administered 2020-01-09 – 2020-01-11 (×3): 1000 mL
  Filled 2020-01-09 (×4): qty 1000

## 2020-01-09 NOTE — Progress Notes (Signed)
Physical Therapy Treatment Patient Details Name: Joshua [name] MRN: [id number] DOB: Jan 20, 1981 Today's Date: 01/09/2020    History of Present Illness Pt is a 39 y.o. 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 S/p L frontoparietal craniectomy for evacuation of L basal ganglia hematoma 9/12. ETT 9/12-9/18, 9/18-9/23. PEG tube placed 9/28 secondary to dysphagia. PMH includes alcohol abuse, seizures, HTN.    PT Comments    Patient received in bed, alert, non-verbal, waved at my presence. Patient requires max assist for all bed mobility at this time. Requires +2 assist to raise trunk and sit at edge of bed. Heavy right lateral lean in sitting. Holding to my clothing with left hand to assist in supporting himself while sitting up. Unable to sit without 1UE assist. He sat briefly without external support. Patient will continue to benefit from skilled PT while here to improve functional independence.    Follow Up Recommendations  SNF     Equipment Recommendations  Wheelchair (measurements PT);Wheelchair cushion (measurements PT);Hospital bed;3in1 (PT)    Recommendations for Other Services       Precautions / Restrictions Precautions Precautions: Fall Restrictions Weight Bearing Restrictions: No    Mobility  Bed Mobility Overal bed mobility: Needs Assistance Bed Mobility: Supine to Sit;Sit to Supine;Sit to Sidelying   Sidelying to sit: Max assist;+2 for physical assistance Supine to sit: Max assist;+2 for physical assistance Sit to supine: Mod assist   General bed mobility comments: patient resisting mobility, does not respond to cues to bring LEs off bed. Requiring max assist, requires max assist to raise trunk to seated position. Able to balance himself briefly while sitting. Grabbing hold of PT clothing to stabilize and due to apparent fear.  Transfers                 General transfer comment: unable to attempt due to poor  sitting balance this session  Ambulation/Gait                 Stairs             Wheelchair Mobility    Modified Rankin (Stroke Patients Only) Modified Rankin (Stroke Patients Only) Pre-Morbid Rankin Score: No symptoms Modified Rankin: Severe disability     Balance Overall balance assessment: Needs assistance Sitting-balance support: Feet supported;Bilateral upper extremity supported Sitting balance-Leahy Scale: Poor Sitting balance - Comments: reliant on external support needing MAX A, intermittent periods of close min guard with pt using LUE to support UB but unable to sustain for more than 10 seconds Postural control: Right lateral lean;Posterior lean                                  Cognition Arousal/Alertness: Awake/alert Behavior During Therapy: Flat affect Overall Cognitive Status: No family/caregiver present to determine baseline cognitive functioning Area of Impairment: Attention;Following commands;Safety/judgement;Awareness;Problem solving                   Current Attention Level: Focused   Following Commands: Follows one step commands inconsistently Safety/Judgement: Decreased awareness of safety;Decreased awareness of deficits Awareness: Intellectual Problem Solving: Slow processing;Difficulty sequencing;Requires verbal cues;Decreased initiation;Requires tactile cues General Comments: able to follow 1 step commands inconsistently, resistant to all movement this session, therefore difficult to assess whether resistance is related to cognition or fear of falling      Exercises      General Comments  Pertinent Vitals/Pain Pain Assessment: Faces Faces Pain Scale: Hurts a little bit Pain Location: general discomfort with movement, guarding and resisting assist Pain Descriptors / Indicators: Discomfort;Guarding    Home Living                      Prior Function            PT Goals (current goals can  now be found in the care plan section) Acute Rehab PT Goals Patient Stated Goal: none stated PT Goal Formulation: Patient unable to participate in goal setting Time For Goal Achievement: 01/23/20 Progress towards PT goals: Not progressing toward goals - comment(continues to require max/total assist for bed mobility and sitting up on side of bed)    Frequency    Min 2X/week      PT Plan Current plan remains appropriate    Co-evaluation              AM-PAC PT "6 Clicks" Mobility   Outcome Measure  Help needed turning from your back to your side while in a flat bed without using bedrails?: Total Help needed moving from lying on your back to sitting on the side of a flat bed without using bedrails?: Total Help needed moving to and from a bed to a chair (including a wheelchair)?: Total Help needed standing up from a chair using your arms (e.g., wheelchair or bedside chair)?: Total Help needed to walk in hospital room?: Total Help needed climbing 3-5 steps with a railing? : Total 6 Click Score: 6    End of Session   Activity Tolerance: Patient tolerated treatment well Patient left: in bed;with call bell/phone within reach Nurse Communication: Mobility status PT Visit Diagnosis: Muscle weakness (generalized) (M62.81);Hemiplegia and hemiparesis;Other abnormalities of gait and mobility (R26.89) Hemiplegia - Right/Left: Right Hemiplegia - dominant/non-dominant: Dominant Hemiplegia - caused by: Nontraumatic intracerebral hemorrhage     Time: 0950-1005 PT Time Calculation (min) (ACUTE ONLY): 15 min  Charges:  $Therapeutic Activity: 8-22 mins                     Devi Hopman, PT, GCS 01/09/20,11:41 AM

## 2020-01-09 NOTE — Progress Notes (Signed)
PROGRESS NOTE    Joshua [name]  [id number] DOB: [birth year] DOA: 09/09/2019 PCP: Patient, [name]   Brief Narrative: 39 year old male admitted by neurosurgery on 09/09/2019 for seizure, right hemiplegia and decreased responsiveness, found to have left hemispheric intracranial hemorrhage, intubated and  admitted to the ICU.  Underwent craniotomy for decompression on 09/09/2019 by Dr. Vertell Limber, patient  with  slow neurological recovery and prolonged hospitalization due to seizure and aspiration pneumonia.  Patient continued to be awake but with significant dysphagia.  PEG tube was placed on 09/25/2019, disposition continue to be challenging as he is uninsured and needs long-term placement.  Patient vomited tube feeding contents the night of 01/06/2019.  And to proceed with KUB.  Hold tube feeding until x-ray available  Assessment & Plan:   Principal Problem:   ICH (intracerebral hemorrhage) (HCC) Active Problems:   Subdural hematoma (HCC)   Intracerebral hemorrhage (HCC)   Cytotoxic brain edema (HCC)   Hemorrhagic stroke (HCC)   Acute respiratory failure ([place])   Essential hypertension   Severe protein-calorie malnutrition ([place])   CKD (chronic kidney disease), stage IV (HCC)   Seizure ([place])   AKI (acute kidney injury) ([place])   1-Left ICA: Patient underwent decompression craniotomy and hematoma evacuation on 09/09/2019, it was felt that he was likely due to uncontrolled hypertension, CTA did not show vascular malformation. Patient is a still aphasic and nonverbal with right hemiplegia and significant cognitive impairment. Dr. Vertell Limber did not recommend a replacement of skull bone.   2- Seizure; continue with Keppra twice daily.  Will change to IV due to vomiting.  Continue with IV Keppra for 24 hours monitor will make sure patient is able to tolerate overall.  3-Aspiration pneumonia/dysphagia Completed course of IV antibiotic PEG tube in place Resume tube feedings slow rate and advance  as tolerated  4-Klebsiella UTI: Treated with cefazolin  Hypertension: Continue with amlodipine, hydralazine and Lopressor  AKI on Chronic Kidney diseases III a;  Cr on admission up to 2.08 Cr decreased to 1.4--1.5 Continue with IV fluids for another 24 hours, patient received contrast for CT scan  Vomiting: Tube feeding content. Holding tube feeding. KUB amorphous material right.  CT abdomen and pelvis negative for obstruction or other abnormalities No further vomiting.  Recent tube feedings   Nutrition Problem: Severe Malnutrition Etiology: acute illness(intracerebral hemorrage)    Signs/Symptoms: severe fat depletion, severe muscle depletion    Interventions: Tube feeding, Refer to RD note for recommendations  Estimated body mass index is 18.92 kg/m as calculated from the following:   Height as of this encounter: 5\' 9"  (1.753 m).   Weight as of this encounter: 58.1 kg.   DVT prophylaxis: Lovenox Code Status: Full code Family Communication: no Family at bedside Disposition Plan: Waiting placement, evaluation for vomiting Consultants:   NeuroSurgery  Procedures:     Antimicrobials:  None currently  Subjective: Patient is awake alert, does not follow commands nonverbal.  Objective: Vitals:   01/09/20 0017 01/09/20 0323 01/09/20 0743 01/09/20 1125  BP: (!) 138/96 (!) 132/95 (!) 119/99 (!) 133/100  Pulse: 85 (!) 102 94 78  Resp: 18 18 18 14   Temp: 98.5 F (36.9 C) 97.7 F (36.5 C) 98.1 F (36.7 C) 98.2 F (36.8 C)  TempSrc: Oral Oral Axillary Oral  SpO2: 100% 99% 100% 100%  Weight:      Height:        Intake/Output Summary (Last 24 hours) at 01/09/2020 1409 Last data filed at 01/09/2020 0900 Gross per 24  hour  Intake 0 ml  Output --  Net 0 ml   Filed Weights   01/02/20 0500 01/05/20 0648 01/07/20 0417  Weight: 61 kg 61.2 kg 58.1 kg    Examination:  General exam: NAD Respiratory system: CTA Cardiovascular system: S 1, S 2  RRR Gastrointestinal system: BS present, soft, nt Central nervous system: Alert, not follow command, aphasic Extremities: no edema Skin: No rashes   Data Reviewed: I have personally reviewed following labs and imaging studies  CBC: Recent Labs  Lab 01/08/20 1011  WBC 7.9  HGB 12.3*  HCT 39.0  MCV 82.8  PLT Q000111Q   Basic Metabolic Panel: Recent Labs  Lab 01/08/20 1011 01/09/20 0751  NA 142 144  K 4.3 4.0  CL 102 107  CO2 28 27  GLUCOSE 100* 82  BUN 33* 31*  CREATININE 1.55* 1.65*  CALCIUM 10.5* 10.0   GFR: Estimated Creatinine Clearance: 48.9 mL/min (A) (by C-G formula based on SCr of 1.65 mg/dL (H)). Liver Function Tests: No results for input(s): AST, ALT, ALKPHOS, BILITOT, PROT, ALBUMIN in the last 168 hours. No results for input(s): LIPASE, AMYLASE in the last 168 hours. No results for input(s): AMMONIA in the last 168 hours. Coagulation Profile: No results for input(s): INR, PROTIME in the last 168 hours. Cardiac Enzymes: No results for input(s): CKTOTAL, CKMB, CKMBINDEX, TROPONINI in the last 168 hours. BNP (last 3 results) No results for input(s): PROBNP in the last 8760 hours. HbA1C: No results for input(s): HGBA1C in the last 72 hours. CBG: Recent Labs  Lab 01/08/20 2016 01/08/20 2334 01/09/20 0342 01/09/20 0735 01/09/20 1157  GLUCAP 82 99 146* 77 95   Lipid Profile: No results for input(s): CHOL, HDL, LDLCALC, TRIG, CHOLHDL, LDLDIRECT in the last 72 hours. Thyroid Function Tests: No results for input(s): TSH, T4TOTAL, FREET4, T3FREE, THYROIDAB in the last 72 hours. Anemia Panel: No results for input(s): VITAMINB12, FOLATE, FERRITIN, TIBC, IRON, RETICCTPCT in the last 72 hours. Sepsis Labs: No results for input(s): PROCALCITON, LATICACIDVEN in the last 168 hours.  No results found for this or any previous visit (from the past 240 hour(s)).       Radiology Studies: DG Abd 1 View  Result Date: 01/08/2020 CLINICAL DATA:  Vomiting last  night. EXAM: ABDOMEN - 1 VIEW COMPARISON:  09/21/2019 FINDINGS: Peg teen projects over the gastric body. Amorphous hyperattenuation within the right-side of the abdomen may represent contrast within the distal stomach and duodenum, but is atypical in appearance. Otherwise, no bowel obstruction or free intraperitoneal air identified. No abnormal abdominal calcifications. No appendicolith. IMPRESSION: Amorphous hyperattenuation projecting over the right-side of the abdomen, nonspecific. This could potentially be external to the patient, or represent contrast within the distal stomach and proximal bowel. Of note, a similar area of hyperattenuation is present but less distinct on the prior exam. The consider acute abdomen series and possibly lateral abdominal radiograph. Depending on clinical symptomatology, CT may also be informative. These results will be called to the ordering clinician or representative by the Radiologist Assistant, and communication documented in the PACS or zVision Dashboard. Electronically Signed   By: Abigail Miyamoto M.D.   On: 01/08/2020 14:13   CT ABDOMEN PELVIS W CONTRAST  Result Date: 01/08/2020 CLINICAL DATA:  Vomiting.  Feeding tube. EXAM: CT ABDOMEN AND PELVIS WITH CONTRAST TECHNIQUE: Multidetector CT imaging of the abdomen and pelvis was performed using the standard protocol following bolus administration of intravenous contrast. CONTRAST:  151mL OMNIPAQUE IOHEXOL 300 MG/ML  SOLN COMPARISON:  None. FINDINGS: Lower chest: Lung bases clear bilaterally. Hepatobiliary: No focal liver abnormality is seen. No gallstones, gallbladder wall thickening, or biliary dilatation. Pancreas: Negative Spleen: Negative Adrenals/Urinary Tract: 3 mm left upper pole calculus. No renal obstruction or mass. Normal bladder. Stomach/Bowel: Feeding tube in the body of the stomach in good position. Negative for bowel obstruction. No bowel mass or edema. Normal appendix. Vascular/Lymphatic: Negative Reproductive:  Normal prostate. Other: Craniotomy flaps in the anterior abdominal wall on the right following craniectomy. Musculoskeletal: No acute skeletal abnormality. IMPRESSION: Feeding tube in good position of stomach. No bowel obstruction or edema. No acute abnormality. Electronically Signed   By: Franchot Gallo M.D.   On: 01/08/2020 16:19        Scheduled Meds: . amLODipine  10 mg Per Tube Daily  . chlorhexidine  15 mL Mouth Rinse BID  . enoxaparin (LOVENOX) injection  40 mg Subcutaneous Daily  . feeding supplement (OSMOLITE 1.2 CAL)  1,000 mL Per Tube Q24H  . feeding supplement (PRO-STAT SUGAR FREE 64)  30 mL Per Tube BID  . folic acid  1 mg Per Tube Daily  . free water  300 mL Per Tube Q4H  . insulin aspart  0-15 Units Subcutaneous Q4H  . mouth rinse  15 mL Mouth Rinse q12n4p  . multivitamin  15 mL Per Tube Daily  . QUEtiapine  50 mg Per Tube QHS  . thiamine  100 mg Per Tube Daily   Continuous Infusions: . sodium chloride 100 mL/hr at 01/09/20 1352  . levETIRAcetam 500 mg (01/09/20 0401)     LOS: 122 days    Time spent: 35 minutes    Joshua [name] A Malaiah Viramontes, MD [facility]  If 7PM-7AM, please contact night-coverage [email or web address] Password [facility] 01/09/2020, 2:09 PM

## 2020-01-09 NOTE — Progress Notes (Signed)
Nutrition Follow-up  DOCUMENTATION CODES:   Severe malnutrition in context of acute illness/injury  INTERVENTION:  Once able to advance past rate of 30 ml/hr,  Recommend increasing Osmolite 1.2 formula via PEG by 10 ml every 3 hours to goal rate of 75 ml/hr.   Provide 30 ml Prostat BID per tube.   Free water flushes of 300 ml every 4 hours per tube. (MD to adjust as appropriate)  Tube feeding regimen at goal rate to provide 2360 kcal, 130 grams of protein, and 3276 ml of free water.    NUTRITION DIAGNOSIS:   Severe Malnutrition related to acute illness(intracerebral hemorrage) as evidenced by severe fat depletion, severe muscle depletion; ongoing  GOAL:   Patient will meet greater than or equal to 90% of their needs; progressing  MONITOR:   TF tolerance, Skin, Weight trends, Labs, I & O's  REASON FOR ASSESSMENT:   Ventilator, Consult Enteral/tube feeding initiation and management  ASSESSMENT:   39 yo male admitted with large left intracranial hemorrhage; S/P craniotomy. PMH includes HTN, alcoholism, noncompliance.  9/12left frontal temporal craniectomy and evacuation hematoma.  9/23 Extubated.  10/1 PEG placed 11/23- Nutrition Focused Physical Exam performed, severe subcutaneous fat loss and severe muscle wasting identified.   Pt with emesis 1/10. Tube feeds were put on hold. KUB obtained and pt with no bowel obstruction. Tube feeds restarted this AM at rate of 30 ml/hr x 24 hours. Tube feeding recommendations stated above once able to advance tube feeds. Pt refused po this AM at breakfast. RD to continue to monitor.   Labs and medications reviewed.   Diet Order:   Diet Order            Diet regular Room service appropriate? Yes; Fluid consistency: Thin  Diet effective now              EDUCATION NEEDS:   Not appropriate for education at this time  Skin:  Skin Assessment: Reviewed RN Assessment  Last BM:  1/9  Height:   Ht Readings from Last 1  Encounters:  09/09/19 5\' 9"  (1.753 m)    Weight:   Wt Readings from Last 1 Encounters:  01/07/20 58.1 kg    Ideal Body Weight:  72.7 kg  BMI:  Body mass index is 18.92 kg/m.  Estimated Nutritional Needs:   Kcal:  2100-2400  Protein:  105-125 grams  Fluid:  >/= 2 L/day    Corrin Parker, MS, RD, LDN Pager # [phone] After hours/ weekend pager # [phone]

## 2020-01-10 DIAGNOSIS — I611 Nontraumatic intracerebral hemorrhage in hemisphere, cortical: Secondary | ICD-10-CM | POA: Diagnosis not present

## 2020-01-10 LAB — GLUCOSE, CAPILLARY
Glucose-Capillary: 110 mg/dL — ABNORMAL HIGH (ref 70–99)
Glucose-Capillary: 111 mg/dL — ABNORMAL HIGH (ref 70–99)
Glucose-Capillary: 113 mg/dL — ABNORMAL HIGH (ref 70–99)
Glucose-Capillary: 124 mg/dL — ABNORMAL HIGH (ref 70–99)
Glucose-Capillary: 138 mg/dL — ABNORMAL HIGH (ref 70–99)
Glucose-Capillary: 89 mg/dL (ref 70–99)
Glucose-Capillary: 97 mg/dL (ref 70–99)

## 2020-01-10 LAB — BASIC METABOLIC PANEL
Anion gap: 10 (ref 5–15)
BUN: 25 mg/dL — ABNORMAL HIGH (ref 6–20)
CO2: 26 mmol/L (ref 22–32)
Calcium: 9.9 mg/dL (ref 8.9–10.3)
Chloride: 104 mmol/L (ref 98–111)
Creatinine, Ser: 1.39 mg/dL — ABNORMAL HIGH (ref 0.61–1.24)
GFR calc Af Amer: >60 mL/min (ref >60)
GFR calc non Af Amer: >60 mL/min (ref >60)
Glucose, Bld: 135 mg/dL — ABNORMAL HIGH (ref 70–99)
Potassium: 4 mmol/L (ref 3.5–5.1)
Sodium: 140 mmol/L (ref 135–145)

## 2020-01-10 MED ORDER — LEVETIRACETAM 100 MG/ML PO SOLN
500.0000 mg | Freq: Two times a day (BID) | ORAL | Status: DC
  Administered 2020-01-10 – 2020-01-12 (×4): 500 mg
  Filled 2020-01-10 (×6): qty 5

## 2020-01-10 NOTE — Progress Notes (Signed)
PROGRESS NOTE    Joshua [name]  [id number] DOB: 03/06/1981 DOA: 09/09/2019 PCP: Patient, [name]   Brief Narrative: 39 year old male admitted by neurosurgery on 09/09/2019 for seizure, right hemiplegia and decreased responsiveness, found to have left hemispheric intracranial hemorrhage, intubated and  admitted to the ICU.  Underwent craniotomy for decompression on 09/09/2019 by Dr. Vertell Limber, patient  with  slow neurological recovery and prolonged hospitalization due to seizure and aspiration pneumonia.  Patient continued to be awake but with significant dysphagia.  PEG tube was placed on 09/25/2019, disposition continue to be challenging as he is uninsured and needs long-term placement.  Patient vomited tube feeding contents the night of 01/06/2019.  CT abdomen negative for obstruction. Currently tolerating tube feedings.   Assessment & Plan:   Principal Problem:   ICH (intracerebral hemorrhage) (HCC) Active Problems:   Subdural hematoma (HCC)   Intracerebral hemorrhage (HCC)   Cytotoxic brain edema (HCC)   Hemorrhagic stroke (HCC)   Acute respiratory failure ([place])   Essential hypertension   Severe protein-calorie malnutrition ([place])   CKD (chronic kidney disease), stage IV (HCC)   Seizure ([place])   AKI (acute kidney injury) ([place])   1-Left ICA: Patient underwent decompression craniotomy and hematoma evacuation on 09/09/2019, it was felt that he was likely due to uncontrolled hypertension, CTA did not show vascular malformation. Patient is a still aphasic and nonverbal with right hemiplegia and significant cognitive impairment. Dr. Vertell Limber did not recommend a replacement of skull bone.   2- Seizure; continue with Keppra twice daily.  Change to oral.   3-Aspiration pneumonia/dysphagia Completed course of IV antibiotic PEG tube in place Tolerating tube feeding.  4-Klebsiella UTI: Treated with cefazolin  Hypertension: Continue with amlodipine, hydralazine and Lopressor  AKI on  Chronic Kidney diseases III a;  Cr on admission up to 2.08 Cr decreased to 1.4--1.5 Continue with IV fluids for another 24 hours, patient received contrast for CT scan Cr stable at 1.3  Vomiting: Tube feeding content. Holding tube feeding. KUB amorphous material right.  CT abdomen and pelvis negative for obstruction or other abnormalities No further vomiting.  Recent tube feedings   Nutrition Problem: Severe Malnutrition Etiology: acute illness(intracerebral hemorrage)    Signs/Symptoms: severe fat depletion, severe muscle depletion    Interventions: Tube feeding, Refer to RD note for recommendations  Estimated body mass index is 18.92 kg/m as calculated from the following:   Height as of this encounter: 5\' 9"  (1.753 m).   Weight as of this encounter: 58.1 kg.   DVT prophylaxis: Lovenox Code Status: Full code Family Communication: no Family at bedside Disposition Plan: Waiting placement, evaluation for vomiting Consultants:   NeuroSurgery  Procedures:     Antimicrobials:  None currently  Subjective: Alert, non verbal, does not follows command.   Objective: Vitals:   01/10/20 0025 01/10/20 0439 01/10/20 0750 01/10/20 1159  BP: (!) 149/112 (!) 157/118 (!) 154/116 (!) 146/114  Pulse: 78 77 76 97  Resp:  19 18 18   Temp: 98.2 F (36.8 C) 97.6 F (36.4 C) 98.1 F (36.7 C) 98.6 F (37 C)  TempSrc: Oral Oral Oral Oral  SpO2: 100% 100% 100% (!) 0%  Weight:      Height:        Intake/Output Summary (Last 24 hours) at 01/10/2020 1332 Last data filed at 01/10/2020 0453 Gross per 24 hour  Intake 2165.9 ml  Output --  Net 2165.9 ml   Filed Weights   01/02/20 0500 01/05/20 0648 01/07/20 0417  Weight:  61 kg 61.2 kg 58.1 kg    Examination:  General exam: NAD Respiratory system: CTA Cardiovascular system: S 1, S 2 RRR Gastrointestinal system: BS present, soft, nt Central nervous system:Alert, non verbal Extremities: no edema Skin: No rashes   Data  Reviewed: I have personally reviewed following labs and imaging studies  CBC: Recent Labs  Lab 01/08/20 1011  WBC 7.9  HGB 12.3*  HCT 39.0  MCV 82.8  PLT Q000111Q   Basic Metabolic Panel: Recent Labs  Lab 01/08/20 1011 01/09/20 0751 01/10/20 0308  NA 142 144 140  K 4.3 4.0 4.0  CL 102 107 104  CO2 28 27 26   GLUCOSE 100* 82 135*  BUN 33* 31* 25*  CREATININE 1.55* 1.65* 1.39*  CALCIUM 10.5* 10.0 9.9   GFR: Estimated Creatinine Clearance: 58.1 mL/min (A) (by C-G formula based on SCr of 1.39 mg/dL (H)). Liver Function Tests: No results for input(s): AST, ALT, ALKPHOS, BILITOT, PROT, ALBUMIN in the last 168 hours. No results for input(s): LIPASE, AMYLASE in the last 168 hours. No results for input(s): AMMONIA in the last 168 hours. Coagulation Profile: No results for input(s): INR, PROTIME in the last 168 hours. Cardiac Enzymes: No results for input(s): CKTOTAL, CKMB, CKMBINDEX, TROPONINI in the last 168 hours. BNP (last 3 results) No results for input(s): PROBNP in the last 8760 hours. HbA1C: No results for input(s): HGBA1C in the last 72 hours. CBG: Recent Labs  Lab 01/09/20 2010 01/10/20 0015 01/10/20 0427 01/10/20 0745 01/10/20 1153  GLUCAP 103* 89 138* 124* 111*   Lipid Profile: No results for input(s): CHOL, HDL, LDLCALC, TRIG, CHOLHDL, LDLDIRECT in the last 72 hours. Thyroid Function Tests: No results for input(s): TSH, T4TOTAL, FREET4, T3FREE, THYROIDAB in the last 72 hours. Anemia Panel: No results for input(s): VITAMINB12, FOLATE, FERRITIN, TIBC, IRON, RETICCTPCT in the last 72 hours. Sepsis Labs: No results for input(s): PROCALCITON, LATICACIDVEN in the last 168 hours.  No results found for this or any previous visit (from the past 240 hour(s)).       Radiology Studies: DG Abd 1 View  Result Date: 01/08/2020 CLINICAL DATA:  Vomiting last night. EXAM: ABDOMEN - 1 VIEW COMPARISON:  09/21/2019 FINDINGS: Peg teen projects over the gastric body.  Amorphous hyperattenuation within the right-side of the abdomen may represent contrast within the distal stomach and duodenum, but is atypical in appearance. Otherwise, no bowel obstruction or free intraperitoneal air identified. No abnormal abdominal calcifications. No appendicolith. IMPRESSION: Amorphous hyperattenuation projecting over the right-side of the abdomen, nonspecific. This could potentially be external to the patient, or represent contrast within the distal stomach and proximal bowel. Of note, a similar area of hyperattenuation is present but less distinct on the prior exam. The consider acute abdomen series and possibly lateral abdominal radiograph. Depending on clinical symptomatology, CT may also be informative. These results will be called to the ordering clinician or representative by the Radiologist Assistant, and communication documented in the PACS or zVision Dashboard. Electronically Signed   By: Abigail Miyamoto M.D.   On: 01/08/2020 14:13   CT ABDOMEN PELVIS W CONTRAST  Result Date: 01/08/2020 CLINICAL DATA:  Vomiting.  Feeding tube. EXAM: CT ABDOMEN AND PELVIS WITH CONTRAST TECHNIQUE: Multidetector CT imaging of the abdomen and pelvis was performed using the standard protocol following bolus administration of intravenous contrast. CONTRAST:  138mL OMNIPAQUE IOHEXOL 300 MG/ML  SOLN COMPARISON:  None. FINDINGS: Lower chest: Lung bases clear bilaterally. Hepatobiliary: No focal liver abnormality is seen. No gallstones, gallbladder  wall thickening, or biliary dilatation. Pancreas: Negative Spleen: Negative Adrenals/Urinary Tract: 3 mm left upper pole calculus. No renal obstruction or mass. Normal bladder. Stomach/Bowel: Feeding tube in the body of the stomach in good position. Negative for bowel obstruction. No bowel mass or edema. Normal appendix. Vascular/Lymphatic: Negative Reproductive: Normal prostate. Other: Craniotomy flaps in the anterior abdominal wall on the right following  craniectomy. Musculoskeletal: No acute skeletal abnormality. IMPRESSION: Feeding tube in good position of stomach. No bowel obstruction or edema. No acute abnormality. Electronically Signed   By: Franchot Gallo M.D.   On: 01/08/2020 16:19        Scheduled Meds: . amLODipine  10 mg Per Tube Daily  . chlorhexidine  15 mL Mouth Rinse BID  . enoxaparin (LOVENOX) injection  40 mg Subcutaneous Daily  . feeding supplement (OSMOLITE 1.2 CAL)  1,000 mL Per Tube Q24H  . feeding supplement (PRO-STAT SUGAR FREE 64)  30 mL Per Tube BID  . folic acid  1 mg Per Tube Daily  . free water  300 mL Per Tube Q4H  . insulin aspart  0-15 Units Subcutaneous Q4H  . levETIRAcetam  500 mg Per Tube BID  . mouth rinse  15 mL Mouth Rinse q12n4p  . multivitamin  15 mL Per Tube Daily  . QUEtiapine  50 mg Per Tube QHS  . thiamine  100 mg Per Tube Daily   Continuous Infusions:    LOS: 123 days    Time spent: 35 minutes    Locke Barrell A Dayon Witt, MD [facility]  If 7PM-7AM, please contact night-coverage [email or web address] Password [email or web address] 01/10/2020, 1:32 PM

## 2020-01-11 DIAGNOSIS — I611 Nontraumatic intracerebral hemorrhage in hemisphere, cortical: Secondary | ICD-10-CM | POA: Diagnosis not present

## 2020-01-11 LAB — GLUCOSE, CAPILLARY
Glucose-Capillary: 115 mg/dL — ABNORMAL HIGH (ref 70–99)
Glucose-Capillary: 117 mg/dL — ABNORMAL HIGH (ref 70–99)
Glucose-Capillary: 177 mg/dL — ABNORMAL HIGH (ref 70–99)
Glucose-Capillary: 89 mg/dL (ref 70–99)
Glucose-Capillary: 82 mg/dL (ref 70–99)

## 2020-01-11 LAB — VITAMIN D 25 HYDROXY (VIT D DEFICIENCY, FRACTURES): Vit D, 25-Hydroxy: 31.61 ng/mL (ref 30–100)

## 2020-01-11 MED ORDER — OSMOLITE 1.2 CAL PO LIQD
474.0000 mL | Freq: Three times a day (TID) | ORAL | Status: DC
  Administered 2020-01-11 – 2020-01-24 (×45): 474 mL
  Filled 2020-01-11 (×56): qty 474

## 2020-01-11 NOTE — Progress Notes (Addendum)
Physical Therapy Treatment Patient Details Name: Joshua [name] MRN: [id number] DOB: [birth year] Today's Date: 01/11/2020    History of Present Illness Pt is a 39 y.o. 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 S/p L frontoparietal craniectomy for evacuation of L basal ganglia hematoma 9/12. ETT 9/12-9/18, 9/18-9/23. PEG tube placed 9/28 secondary to dysphagia. PMH includes alcohol abuse, seizures, HTN.    PT Comments    Pt is very self limiting and resistant to multimodal cues.  Only able to achieve partial rise to seated position then having to return to supine due to self limiting behavior.  Spoke with supervising PT and reduced frequency to 1x week until participation improves.  He continues to be appropriate for long term snf placement.    Follow Up Recommendations  SNF     Equipment Recommendations       Recommendations for Other Services       Precautions / Restrictions Precautions Precautions: Fall Precaution Comments: L crani, PEG, helmet Required Braces or Orthoses: Splint/Cast Splint/Cast: resting hand splint and elbow splint for R UE due synergistic pattern Restrictions Weight Bearing Restrictions: No    Mobility  Bed Mobility Overal bed mobility: Needs Assistance Bed Mobility: Supine to Sit;Sit to Supine;Rolling Rolling: Mod assist;+2 for physical assistance   Supine to sit: Max assist;+2 for physical assistance Sit to supine: Max assist;+2 for physical assistance   General bed mobility comments: Pt following commands to roll for pericare but resistant to allow tech to clean patient up.  While PTA assisted patient in sidelying he began to kick toward therapist.  After brief change PTA attempted supine to sit but he continues to resist so returned to supine position.  Transfers                    Ambulation/Gait                 Stairs             Wheelchair Mobility    Modified Rankin  (Stroke Patients Only)       Balance                                            Cognition Arousal/Alertness: Awake/alert Behavior During Therapy: Flat affect Overall Cognitive Status: Difficult to assess                                 General Comments: Communication remains impaired but he is trying to voice to therapist.  He is very resistant to mobility which makes it difficult to progress therapy.      Exercises      General Comments        Pertinent Vitals/Pain Pain Assessment: Faces Faces Pain Scale: No hurt    Home Living                      Prior Function            PT Goals (current goals can now be found in the care plan section) Acute Rehab PT Goals Potential to Achieve Goals: Fair Progress towards PT goals: Not progressing toward goals - comment(very self limiting and resistant)    Frequency    Min 1X/week  PT Plan Discharge plan needs to be updated    Co-evaluation              AM-PAC PT "6 Clicks" Mobility   Outcome Measure  Help needed turning from your back to your side while in a flat bed without using bedrails?: Total Help needed moving from lying on your back to sitting on the side of a flat bed without using bedrails?: Total Help needed moving to and from a bed to a chair (including a wheelchair)?: Total Help needed standing up from a chair using your arms (e.g., wheelchair or bedside chair)?: Total Help needed to walk in hospital room?: Total Help needed climbing 3-5 steps with a railing? : Total 6 Click Score: 6    End of Session Equipment Utilized During Treatment: Gait belt Activity Tolerance: Patient tolerated treatment well Patient left: in bed;with call bell/phone within reach Nurse Communication: Mobility status PT Visit Diagnosis: Muscle weakness (generalized) (M62.81);Hemiplegia and hemiparesis;Other abnormalities of gait and mobility (R26.89) Hemiplegia - Right/Left:  Right Hemiplegia - dominant/non-dominant: Dominant Hemiplegia - caused by: Nontraumatic intracerebral hemorrhage     Time: [id number] PT Time Calculation (min) (ACUTE ONLY): 32 min  Charges:  $Therapeutic Activity: 23-37 mins                     [name] , PTA Acute Rehabilitation Services Pager [phone] Office [phone]     Cassian Torelli [name] 01/11/2020, 6:23 PM

## 2020-01-11 NOTE — TOC Progression Note (Signed)
Transition of Care [facility]) - Progression Note    Patient Details  Name: Joshua [name] MRN: [id number] Date of Birth: Feb 26, 1981  Transition of [facility]) CM/SW Contact  Pollie Friar, RN Phone Number: 01/11/2020, 11:01 AM  Clinical Narrative:    Continues to have disability pending and no bed offers for SNF rehab.  TOC following.   Expected Discharge Plan: Skilled Nursing Facility Barriers to Discharge: SNF Pending payor source - [employer], SNF Pending bed offer, Inadequate or no insurance  Expected Discharge Plan and Services Expected Discharge Plan: [place] In-house Referral: Clinical Social Work Discharge Planning Services: NA Post Acute Care Choice: [place] Living arrangements for the past 2 months: Apartment                 DME Arranged: N/A DME Agency: NA       HH Arranged: NA HH Agency: NA         Social Determinants of Health (SDOH) Interventions    Readmission Risk Interventions No flowsheet data found.

## 2020-01-11 NOTE — Progress Notes (Signed)
PROGRESS NOTE    Joshua [name]  [id number] DOB: [birth year] DOA: 09/09/2019 PCP: Patient, [name]   Brief Narrative: 39 year old male admitted by neurosurgery on 09/09/2019 for seizure, right hemiplegia and decreased responsiveness, found to have left hemispheric intracranial hemorrhage, intubated and  admitted to the ICU.  Underwent craniotomy for decompression on 09/09/2019 by Dr. Vertell Limber, patient  with  slow neurological recovery and prolonged hospitalization due to seizure and aspiration pneumonia.  Patient continued to be awake but with significant dysphagia.  PEG tube was placed on 09/25/2019, disposition continue to be challenging as he is uninsured and needs long-term placement.  Patient vomited tube feeding contents the night of 01/06/2019.  CT abdomen negative for obstruction. Currently tolerating tube feedings.   Assessment & Plan:   Principal Problem:   ICH (intracerebral hemorrhage) (HCC) Active Problems:   Subdural hematoma (HCC)   Intracerebral hemorrhage (HCC)   Cytotoxic brain edema (HCC)   Hemorrhagic stroke (HCC)   Acute respiratory failure ([place])   Essential hypertension   Severe protein-calorie malnutrition ([place])   CKD (chronic kidney disease), stage IV (HCC)   Seizure ([place])   AKI (acute kidney injury) ([place])   1-Left ICA: Patient underwent decompression craniotomy and hematoma evacuation on 09/09/2019, it was felt that he was likely due to uncontrolled hypertension, CTA did not show vascular malformation. Patient is a still aphasic and nonverbal with right hemiplegia and significant cognitive impairment. Dr. Vertell Limber did not recommend a replacement of skull bone.   2- Seizure; continue with Keppra twice daily.  Change to oral.   3-Aspiration pneumonia/dysphagia Completed course of IV antibiotic PEG tube in place Tolerating tube feeding.  4-Klebsiella UTI: Treated with cefazolin  Hypertension: Continue with amlodipine, hydralazine and Lopressor  AKI on  Chronic Kidney diseases III a;  Cr on admission up to 2.08 Cr decreased to 1.4--1.5 Continue with IV fluids for another 24 hours, patient received contrast for CT scan Cr stable at 1.3  Vomiting: Tube feeding content. Holding tube feeding. KUB amorphous material right.  CT abdomen and pelvis negative for obstruction or other abnormalities No further vomiting.  Tolerating tube feeding.    Nutrition Problem: Severe Malnutrition Etiology: acute illness(intracerebral hemorrage)    Signs/Symptoms: severe fat depletion, severe muscle depletion    Interventions: Tube feeding, Refer to RD note for recommendations  Estimated body mass index is 18.92 kg/m as calculated from the following:   Height as of this encounter: 5\' 9"  (1.753 m).   Weight as of this encounter: 58.1 kg.   DVT prophylaxis: Lovenox Code Status: Full code Family Communication: no Family at bedside Disposition Plan: Waiting placement, evaluation for vomiting Consultants:   NeuroSurgery  Procedures:     Antimicrobials:  None currently  Subjective: Non verbal. Alert  Objective: Vitals:   01/11/20 0834 01/11/20 1020 01/11/20 1140 01/11/20 1517  BP: (!) 132/103 (!) 145/113 (!) 146/93 (!) 139/107  Pulse: 92  99 100  Resp: 18  18 18   Temp: 98.2 F (36.8 C)  98.9 F (37.2 C) 98.5 F (36.9 C)  TempSrc: Oral  Oral Oral  SpO2: 100%  100% 100%  Weight:      Height:        Intake/Output Summary (Last 24 hours) at 01/11/2020 1528 Last data filed at 01/10/2020 2300 Gross per 24 hour  Intake 80 ml  Output --  Net 80 ml   Filed Weights   01/02/20 0500 01/05/20 0648 01/07/20 0417  Weight: 61 kg 61.2 kg 58.1 kg  Examination:  General exam: NAD Respiratory system:  CTA Cardiovascular system: S 1, S 2 RRR Gastrointestinal system: BS present, soft, nt Central nervous system: Alert, non verbal.  Extremities: No edema Skin: No rashes   Data Reviewed: I have personally reviewed following labs  and imaging studies  CBC: Recent Labs  Lab 01/08/20 1011  WBC 7.9  HGB 12.3*  HCT 39.0  MCV 82.8  PLT [id number]   Basic Metabolic Panel: Recent Labs  Lab 01/08/20 1011 01/09/20 0751 01/10/20 0308  NA 142 144 140  K 4.3 4.0 4.0  CL 102 107 104  CO2 28 27 26   GLUCOSE 100* 82 135*  BUN 33* 31* 25*  CREATININE 1.55* 1.65* 1.39*  CALCIUM 10.5* 10.0 9.9   GFR: Estimated Creatinine Clearance: 58.1 mL/min (A) (by C-G formula based on SCr of 1.39 mg/dL (H)). Liver Function Tests: No results for input(s): AST, ALT, ALKPHOS, BILITOT, PROT, ALBUMIN in the last 168 hours. No results for input(s): LIPASE, AMYLASE in the last 168 hours. No results for input(s): AMMONIA in the last 168 hours. Coagulation Profile: No results for input(s): INR, PROTIME in the last 168 hours. Cardiac Enzymes: No results for input(s): CKTOTAL, CKMB, CKMBINDEX, TROPONINI in the last 168 hours. BNP (last 3 results) No results for input(s): PROBNP in the last 8760 hours. HbA1C: No results for input(s): HGBA1C in the last 72 hours. CBG: Recent Labs  Lab 01/10/20 2012 01/10/20 2354 01/11/20 0432 01/11/20 0838 01/11/20 1202  GLUCAP 113* 110* 115* 117* 177*   Lipid Profile: No results for input(s): CHOL, HDL, LDLCALC, TRIG, CHOLHDL, LDLDIRECT in the last 72 hours. Thyroid Function Tests: No results for input(s): TSH, T4TOTAL, FREET4, T3FREE, THYROIDAB in the last 72 hours. Anemia Panel: No results for input(s): VITAMINB12, FOLATE, FERRITIN, TIBC, IRON, RETICCTPCT in the last 72 hours. Sepsis Labs: No results for input(s): PROCALCITON, LATICACIDVEN in the last 168 hours.  No results found for this or any previous visit (from the past 240 hour(s)).       Radiology Studies: No results found.      Scheduled Meds: . amLODipine  10 mg Per Tube Daily  . chlorhexidine  15 mL Mouth Rinse BID  . enoxaparin (LOVENOX) injection  40 mg Subcutaneous Daily  . feeding supplement (OSMOLITE 1.2 CAL)  474 mL  Per Tube TID AC & HS  . feeding supplement (PRO-STAT SUGAR FREE 64)  30 mL Per Tube BID  . folic acid  1 mg Per Tube Daily  . free water  300 mL Per Tube Q4H  . insulin aspart  0-15 Units Subcutaneous Q4H  . levETIRAcetam  500 mg Per Tube BID  . mouth rinse  15 mL Mouth Rinse q12n4p  . multivitamin  15 mL Per Tube Daily  . QUEtiapine  50 mg Per Tube QHS  . thiamine  100 mg Per Tube Daily   Continuous Infusions:    LOS: 124 days    Time spent: 35 minutes    Sylar Voong A Ngan Qualls, MD [facility]  If 7PM-7AM, please contact night-coverage [email or web address] Password [facility] 01/11/2020, 3:28 PM

## 2020-01-12 DIAGNOSIS — I611 Nontraumatic intracerebral hemorrhage in hemisphere, cortical: Secondary | ICD-10-CM | POA: Diagnosis not present

## 2020-01-12 LAB — GLUCOSE, CAPILLARY
Glucose-Capillary: 135 mg/dL — ABNORMAL HIGH (ref 70–99)
Glucose-Capillary: 136 mg/dL — ABNORMAL HIGH (ref 70–99)
Glucose-Capillary: 176 mg/dL — ABNORMAL HIGH (ref 70–99)
Glucose-Capillary: 77 mg/dL (ref 70–99)
Glucose-Capillary: 87 mg/dL (ref 70–99)
Glucose-Capillary: 92 mg/dL (ref 70–99)
Glucose-Capillary: 95 mg/dL (ref 70–99)

## 2020-01-12 MED ORDER — VITAMIN D 25 MCG (1000 UNIT) PO TABS
1000.0000 [IU] | ORAL_TABLET | Freq: Every day | ORAL | Status: DC
  Administered 2020-01-12 – 2020-03-15 (×59): 1000 [IU] via ORAL
  Filled 2020-01-12 (×62): qty 1

## 2020-01-12 NOTE — Progress Notes (Signed)
PROGRESS NOTE    Joshua [name]  [id number] DOB: 08/04/1981 DOA: 09/09/2019 PCP: Patient, [name]   Brief Narrative: 39 year old male admitted by neurosurgery on 09/09/2019 for seizure, right hemiplegia and decreased responsiveness, found to have left hemispheric intracranial hemorrhage, intubated and  admitted to the ICU.  Underwent craniotomy for decompression on 09/09/2019 by Dr. Vertell Limber, patient  with  slow neurological recovery and prolonged hospitalization due to seizure and aspiration pneumonia.  Patient continued to be awake but with significant dysphagia.  PEG tube was placed on 09/25/2019, disposition continue to be challenging as he is uninsured and needs long-term placement.  Patient vomited tube feeding contents the night of 01/06/2019.  CT abdomen negative for obstruction. Currently tolerating tube feedings.   Assessment & Plan:   Principal Problem:   ICH (intracerebral hemorrhage) (HCC) Active Problems:   Subdural hematoma (HCC)   Intracerebral hemorrhage (HCC)   Cytotoxic brain edema (HCC)   Hemorrhagic stroke (HCC)   Acute respiratory failure ([place])   Essential hypertension   Severe protein-calorie malnutrition ([place])   CKD (chronic kidney disease), stage IV (HCC)   Seizure ([place])   AKI (acute kidney injury) ([place])   1-Left ICA: Patient underwent decompression craniotomy and hematoma evacuation on 09/09/2019, it was felt that he was likely due to uncontrolled hypertension, CTA did not show vascular malformation. Patient is a still aphasic and nonverbal with right hemiplegia and significant cognitive impairment. Dr. Vertell Limber did not recommend a replacement of skull bone.   2- Seizure; continue with Keppra twice daily.  Change to oral.   3-Aspiration pneumonia/dysphagia Completed course of IV antibiotic PEG tube in place Tolerating tube feeding.  4-Klebsiella UTI: Treated with cefazolin  Hypertension: Continue with amlodipine, hydralazine and Lopressor  AKI on  Chronic Kidney diseases III a;  Cr on admission up to 2.08 Cr decreased to 1.4--1.5 Continue with IV fluids for another 24 hours, patient received contrast for CT scan Cr stable at 1.3  Vomiting: Tube feeding content. Holding tube feeding. KUB amorphous material right.  CT abdomen and pelvis negative for obstruction or other abnormalities No further vomiting.  Tolerating tube feeding.   Vitamin D supplement.   Nutrition Problem: Severe Malnutrition Etiology: acute illness(intracerebral hemorrage)    Signs/Symptoms: severe fat depletion, severe muscle depletion    Interventions: Tube feeding, Refer to RD note for recommendations  Estimated body mass index is 16.93 kg/m as calculated from the following:   Height as of this encounter: 5\' 9"  (1.753 m).   Weight as of this encounter: 52 kg.   DVT prophylaxis: Lovenox Code Status: Full code Family Communication: no Family at bedside Disposition Plan: Waiting placement, evaluation for vomiting Consultants:   NeuroSurgery  Procedures:     Antimicrobials:  None currently  Subjective: Non verbal , alert  Objective: Vitals:   01/12/20 0430 01/12/20 0636 01/12/20 0733 01/12/20 1150  BP: (!) 154/116  (!) 152/108 (!) 141/98  Pulse: 83  (!) 102 (!) 102  Resp: 18  18 18   Temp: 98.2 F (36.8 C)  98.4 F (36.9 C) 98 F (36.7 C)  TempSrc: Oral  Oral Oral  SpO2: 100%  100% 100%  Weight:  52 kg    Height:       No intake or output data in the 24 hours ending 01/12/20 1434 Filed Weights   01/05/20 0648 01/07/20 0417 01/12/20 0636  Weight: 61.2 kg 58.1 kg 52 kg    Examination:  General exam: NAD Respiratory system:  CTA Cardiovascular system: S  1, S 2 RRR Gastrointestinal system: BS present, soft, nt Central nervous system: Alert, non verbal Extremities: no edema Skin: No rashes   Data Reviewed: I have personally reviewed following labs and imaging studies  CBC: Recent Labs  Lab 01/08/20 1011  WBC 7.9   HGB 12.3*  HCT 39.0  MCV 82.8  PLT Q000111Q   Basic Metabolic Panel: Recent Labs  Lab 01/08/20 1011 01/09/20 0751 01/10/20 0308  NA 142 144 140  K 4.3 4.0 4.0  CL 102 107 104  CO2 28 27 26   GLUCOSE 100* 82 135*  BUN 33* 31* 25*  CREATININE 1.55* 1.65* 1.39*  CALCIUM 10.5* 10.0 9.9   GFR: Estimated Creatinine Clearance: 52 mL/min (A) (by C-G formula based on SCr of 1.39 mg/dL (H)). Liver Function Tests: No results for input(s): AST, ALT, ALKPHOS, BILITOT, PROT, ALBUMIN in the last 168 hours. No results for input(s): LIPASE, AMYLASE in the last 168 hours. No results for input(s): AMMONIA in the last 168 hours. Coagulation Profile: No results for input(s): INR, PROTIME in the last 168 hours. Cardiac Enzymes: No results for input(s): CKTOTAL, CKMB, CKMBINDEX, TROPONINI in the last 168 hours. BNP (last 3 results) No results for input(s): PROBNP in the last 8760 hours. HbA1C: No results for input(s): HGBA1C in the last 72 hours. CBG: Recent Labs  Lab 01/11/20 2008 01/12/20 0013 01/12/20 0431 01/12/20 0728 01/12/20 1148  GLUCAP 82 135* 77 176* 136*   Lipid Profile: No results for input(s): CHOL, HDL, LDLCALC, TRIG, CHOLHDL, LDLDIRECT in the last 72 hours. Thyroid Function Tests: No results for input(s): TSH, T4TOTAL, FREET4, T3FREE, THYROIDAB in the last 72 hours. Anemia Panel: No results for input(s): VITAMINB12, FOLATE, FERRITIN, TIBC, IRON, RETICCTPCT in the last 72 hours. Sepsis Labs: No results for input(s): PROCALCITON, LATICACIDVEN in the last 168 hours.  No results found for this or any previous visit (from the past 240 hour(s)).       Radiology Studies: No results found.      Scheduled Meds: . amLODipine  10 mg Per Tube Daily  . chlorhexidine  15 mL Mouth Rinse BID  . cholecalciferol  1,000 Units Oral Daily  . enoxaparin (LOVENOX) injection  40 mg Subcutaneous Daily  . feeding supplement (OSMOLITE 1.2 CAL)  474 mL Per Tube TID AC & HS  . feeding  supplement (PRO-STAT SUGAR FREE 64)  30 mL Per Tube BID  . folic acid  1 mg Per Tube Daily  . free water  300 mL Per Tube Q4H  . insulin aspart  0-15 Units Subcutaneous Q4H  . levETIRAcetam  500 mg Per Tube BID  . mouth rinse  15 mL Mouth Rinse q12n4p  . multivitamin  15 mL Per Tube Daily  . QUEtiapine  50 mg Per Tube QHS  . thiamine  100 mg Per Tube Daily   Continuous Infusions:    LOS: 125 days    Time spent: 35 minutes    Raine Elsass A Joffre Lucks, MD [facility]  If 7PM-7AM, please contact night-coverage [email or web address] Password [facility] 01/12/2020, 2:34 PM

## 2020-01-12 NOTE — Progress Notes (Signed)
Occupational Therapy Treatment Patient Details Name: Joshua [name] MRN: [id number] DOB: August 02, 1981 Today's Date: 01/12/2020    History of present illness  39 y.o. 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 S/p L frontoparietal craniectomy for evacuation of L basal ganglia hematoma 9/12. ETT 9/12-9/18, 9/18-9/23. PEG tube placed 9/28 secondary to dysphagia. PMH includes alcohol abuse, seizures, HTN.   OT comments  Pt incontinent of bladder on arrival and unaware. Pt total (A) for peri hygiene. Pt required total (A) to don helmet but able to remove on command with increased time. Pt returned to supine for safety at this time with clean linens. Pt tolerated session well. Frequency reduced this session due to lack of participation and progress with long term goal of SNF placement. OT to continue follow acutely for needs.    Follow Up Recommendations  SNF;Supervision/Assistance - 24 hour    Equipment Recommendations  Wheelchair cushion (measurements OT);Wheelchair (measurements OT);Hospital bed    Recommendations for Other Services      Precautions / Restrictions Precautions Precautions: Fall Precaution Comments: L crani, PEG, helmet Required Braces or Orthoses: Splint/Cast Splint/Cast: resting hand splint and elbow splint for R UE due synergistic pattern Restrictions Weight Bearing Restrictions: No       Mobility Bed Mobility Overal bed mobility: Needs Assistance Bed Mobility: Supine to Sit;Sit to Supine Rolling: Max assist   Supine to sit: Total assist Sit to supine: Total assist   General bed mobility comments: pt requires mod (A) to complete LLE knee flexion on command. pt provided multiple cues and more than 45 second delay to attempt to initiate task. Tapping extremity first to cue after verbal cues given on second attempt. pt still fails to initiate  Transfers Overall transfer level: Needs assistance   Transfers: Squat  Pivot Transfers;Lateral/Scoot Transfers     Squat pivot transfers: +2 physical assistance;Max assist    Lateral/Scoot Transfers: Total assist General transfer comment: pt lateral transfered to drop arm chair with OT then transfered back to bed with BIL LE blocked total +2 max (A) with tech. pt reaching with L UE. Pt hand over hand placement of L UE in a safe location for transfer and pt sustained the placement    Balance Overall balance assessment: Needs assistance Sitting-balance support: Single extremity supported;Feet supported Sitting balance-Leahy Scale: Zero Sitting balance - Comments: attempting to hold bed --- OT using hand over hand patient to a surface that is a better ahnd hold and to allow him to utilize this automatic response in a purposeful manner.      Standing balance-Leahy Scale: Poor                             ADL either performed or assessed with clinical judgement   ADL Overall ADL's : Needs assistance/impaired Eating/Feeding: NPO           Lower Body Bathing: Total assistance Lower Body Bathing Details (indicate cue type and reason): pt provided wash cloth hand over hand to attempt to initiate peri care. pt holding wash cloth but therapist physically had to do all motions for selfcare. Pt attempting to move to side on command for buttock hyigene.                        General ADL Comments: Pt incontient of bladder on arrival and sleeping. pt aroused to name call . pt attempting to  assist with log roll at max (A) level for peri care. pt transferred oob to chair for bed to be cleaned. pt transfered back to bed level for safety. RN made aware of hyigene due to family providing diapers and also drainage on cloth that was wrapped around peg .      Vision       Perception     Praxis      Cognition Arousal/Alertness: Awake/alert Behavior During Therapy: Flat affect Overall Cognitive Status: Difficult to assess                                  General Comments: pt not responding verbally this session. pt nodes head yes when asked if he could be cleaned up for session. pt does attempt to remove helmet with cues but requires hand over hand to initiate handing the helmet to therapist. pt had just pushed it upward off head        Exercises     Shoulder Instructions       General Comments risk for skin break down due to incontinence and diaper present with family request to use.     Pertinent Vitals/ Pain       Pain Assessment: No/denies pain  Home Living                                          Prior Functioning/Environment              Frequency  Min 1X/week        Progress Toward Goals  OT Goals(current goals can now be found in the care plan section)  Progress towards OT goals: Progressing toward goals  Acute Rehab OT Goals Patient Stated Goal: none stated OT Goal Formulation: Patient unable to participate in goal setting Time For Goal Achievement: 01/26/20 Potential to Achieve Goals: Good ADL Goals Pt Will Perform Grooming: sitting;with min assist Pt Will Transfer to Toilet: with mod assist;stand pivot transfer Pt/caregiver will Perform Home Exercise Program: Increased ROM;Increased strength;Right Upper extremity;With minimal assist Additional ADL Goal #1: Pt will maintain static sitting balance EOB with minguard to increase independence with ADL task. Additional ADL Goal #2: Pt will reach using RUE and LUE for items outside base of support while sitting with minA in prepartation for ADL completion. Additional ADL Goal #3: Pt will follow one step commands with 75% accuracy during functional task.  Plan Discharge plan remains appropriate    Co-evaluation                 AM-PAC OT "6 Clicks" Daily Activity     Outcome Measure   Help from another person eating meals?: A Lot Help from another person taking care of personal grooming?: A Lot Help from  another person toileting, which includes using toliet, bedpan, or urinal?: A Lot Help from another person bathing (including washing, rinsing, drying)?: Total Help from another person to put on and taking off regular upper body clothing?: Total Help from another person to put on and taking off regular lower body clothing?: Total 6 Click Score: 9    End of Session Equipment Utilized During Treatment: Gait belt  OT Visit Diagnosis: Other abnormalities of gait and mobility (R26.89);Hemiplegia and hemiparesis;Cognitive communication deficit (R41.841) Symptoms and signs involving cognitive functions: Other Nontraumatic ICH Hemiplegia - Right/Left:  Right Hemiplegia - caused by: Other Nontraumatic intracranial hemorrhage   Activity Tolerance Patient tolerated treatment well   Patient Left in bed;with call bell/phone within reach;Other (comment);with restraints reapplied(RN staff in room so alarm not set at the end of session)   Nurse Communication Mobility status;Precautions(RN aware no alarm at this time, )        Time: [id number] OT Time Calculation (min): 18 min  Charges: OT General Charges $OT Visit: 1 Visit OT Treatments $Self Care/Home Management : 8-22 mins   Brynn, OTR/L  Acute Rehabilitation Services Pager: [phone] Office: [phone] .    Jeri Modena 01/12/2020, 10:55 AM

## 2020-01-13 DIAGNOSIS — I611 Nontraumatic intracerebral hemorrhage in hemisphere, cortical: Secondary | ICD-10-CM | POA: Diagnosis not present

## 2020-01-13 LAB — GLUCOSE, CAPILLARY
Glucose-Capillary: 95 mg/dL (ref 70–99)
Glucose-Capillary: 195 mg/dL — ABNORMAL HIGH (ref 70–99)
Glucose-Capillary: 83 mg/dL (ref 70–99)
Glucose-Capillary: 76 mg/dL (ref 70–99)
Glucose-Capillary: 118 mg/dL — ABNORMAL HIGH (ref 70–99)
Glucose-Capillary: 135 mg/dL — ABNORMAL HIGH (ref 70–99)

## 2020-01-13 MED ORDER — LORAZEPAM 2 MG/ML IJ SOLN: 0.5000 mg | Freq: Two times a day (BID) | INTRAMUSCULAR | Status: DC | PRN

## 2020-01-13 MED ORDER — LEVETIRACETAM IN NACL 500 MG/100ML IV SOLN
500.0000 mg | Freq: Two times a day (BID) | INTRAVENOUS | Status: DC
  Administered 2020-01-13 – 2020-01-16 (×6): 500 mg via INTRAVENOUS
  Filled 2020-01-13 (×6): qty 100

## 2020-01-13 NOTE — Progress Notes (Signed)
PROGRESS NOTE    Joshua [name]  [id number] DOB: [birth year] DOA: 09/09/2019 PCP: Patient, [name]   Brief Narrative: 39 year old male admitted by neurosurgery on 09/09/2019 for seizure, right hemiplegia and decreased responsiveness, found to have left hemispheric intracranial hemorrhage, intubated and  admitted to the ICU.  Underwent craniotomy for decompression on 09/09/2019 by Dr. Vertell Limber, patient  with  slow neurological recovery and prolonged hospitalization due to seizure and aspiration pneumonia.  Patient continued to be awake but with significant dysphagia.  PEG tube was placed on 09/25/2019, disposition continue to be challenging as he is uninsured and needs long-term placement.  Patient vomited tube feeding contents the night of 01/06/2019.  CT abdomen negative for obstruction. Currently tolerating tube feedings.   Assessment & Plan:   Principal Problem:   ICH (intracerebral hemorrhage) (HCC) Active Problems:   Subdural hematoma (HCC)   Intracerebral hemorrhage (HCC)   Cytotoxic brain edema (HCC)   Hemorrhagic stroke (HCC)   Acute respiratory failure ([place])   Essential hypertension   Severe protein-calorie malnutrition ([place])   CKD (chronic kidney disease), stage IV (HCC)   Seizure ([place])   AKI (acute kidney injury) ([place])   1-Left ICA: Patient underwent decompression craniotomy and hematoma evacuation on 09/09/2019, it was felt that he was likely due to uncontrolled hypertension, CTA did not show vascular malformation. Patient is a still aphasic and nonverbal with right hemiplegia and significant cognitive impairment. Dr. Vertell Limber did not recommend a replacement of skull bone.   2- Seizure; continue with Keppra twice daily.  Change to oral.  Refusing medication to be place in PEg tube today.  Change keppra to IV.   3-Aspiration pneumonia/dysphagia Completed course of IV antibiotic PEG tube in place Tolerating tube feeding.  4-Klebsiella UTI: Treated with  cefazolin  Hypertension: Continue with amlodipine, hydralazine and Lopressor  AKI on Chronic Kidney diseases III a;  Cr on admission up to 2.08 Cr decreased to 1.4--1.5 Continue with IV fluids for another 24 hours, patient received contrast for CT scan Cr stable at 1.3  Vomiting: Tube feeding content. Holding tube feeding. KUB amorphous material right.  CT abdomen and pelvis negative for obstruction or other abnormalities No further vomiting.  Tolerating tube feeding.   Vitamin D supplement.   Nutrition Problem: Severe Malnutrition Etiology: acute illness(intracerebral hemorrage)    Signs/Symptoms: severe fat depletion, severe muscle depletion    Interventions: Tube feeding, Refer to RD note for recommendations  Estimated body mass index is 16.93 kg/m as calculated from the following:   Height as of this encounter: 5\' 9"  (1.753 m).   Weight as of this encounter: 52 kg.   DVT prophylaxis: Lovenox Code Status: Full code Family Communication: no Family at bedside Disposition Plan: Waiting placement, evaluation for vomiting Consultants:   NeuroSurgery  Procedures:     Antimicrobials:  None currently  Subjective: Non verbal. He wont let Nurse to put medications in peg tube  Objective: Vitals:   01/12/20 2332 01/13/20 0332 01/13/20 0820 01/13/20 1117  BP: 137/86 121/76 (!) 138/109 (!) 131/108  Pulse: 93 83 (!) 103 (!) 105  Resp: 18 18 16 18   Temp: 98.3 F (36.8 C) 98 F (36.7 C) 98.9 F (37.2 C) 99 F (37.2 C)  TempSrc: Oral Oral Oral Oral  SpO2: 100% 100% 100% 100%  Weight:  52 kg    Height:       No intake or output data in the 24 hours ending 01/13/20 1458 Filed Weights   01/07/20 0417 01/12/20 0636  01/13/20 0332  Weight: 58.1 kg 52 kg 52 kg    Examination:  General exam: NAD Respiratory system:  CTA Cardiovascular system: S 1, S 2 RRR Gastrointestinal system: BS present, peg tube in place.  Central nervous system: Alert, non  verbal Extremities: No edema    Data Reviewed: I have personally reviewed following labs and imaging studies  CBC: Recent Labs  Lab 01/08/20 1011  WBC 7.9  HGB 12.3*  HCT 39.0  MCV 82.8  PLT [id number]   Basic Metabolic Panel: Recent Labs  Lab 01/08/20 1011 01/09/20 0751 01/10/20 0308  NA 142 144 140  K 4.3 4.0 4.0  CL 102 107 104  CO2 28 27 26   GLUCOSE 100* 82 135*  BUN 33* 31* 25*  CREATININE 1.55* 1.65* 1.39*  CALCIUM 10.5* 10.0 9.9   GFR: Estimated Creatinine Clearance: 52 mL/min (A) (by C-G formula based on SCr of 1.39 mg/dL (H)). Liver Function Tests: No results for input(s): AST, ALT, ALKPHOS, BILITOT, PROT, ALBUMIN in the last 168 hours. No results for input(s): LIPASE, AMYLASE in the last 168 hours. No results for input(s): AMMONIA in the last 168 hours. Coagulation Profile: No results for input(s): INR, PROTIME in the last 168 hours. Cardiac Enzymes: No results for input(s): CKTOTAL, CKMB, CKMBINDEX, TROPONINI in the last 168 hours. BNP (last 3 results) No results for input(s): PROBNP in the last 8760 hours. HbA1C: No results for input(s): HGBA1C in the last 72 hours. CBG: Recent Labs  Lab 01/12/20 1954 01/12/20 2333 01/13/20 0325 01/13/20 0748 01/13/20 1153  GLUCAP 95 92 95 195* 83   Lipid Profile: No results for input(s): CHOL, HDL, LDLCALC, TRIG, CHOLHDL, LDLDIRECT in the last 72 hours. Thyroid Function Tests: No results for input(s): TSH, T4TOTAL, FREET4, T3FREE, THYROIDAB in the last 72 hours. Anemia Panel: No results for input(s): VITAMINB12, FOLATE, FERRITIN, TIBC, IRON, RETICCTPCT in the last 72 hours. Sepsis Labs: No results for input(s): PROCALCITON, LATICACIDVEN in the last 168 hours.  No results found for this or any previous visit (from the past 240 hour(s)).       Radiology Studies: No results found.      Scheduled Meds: . amLODipine  10 mg Per Tube Daily  . chlorhexidine  15 mL Mouth Rinse BID  . cholecalciferol  1,000  Units Oral Daily  . enoxaparin (LOVENOX) injection  40 mg Subcutaneous Daily  . feeding supplement (OSMOLITE 1.2 CAL)  474 mL Per Tube TID AC & HS  . feeding supplement (PRO-STAT SUGAR FREE 64)  30 mL Per Tube BID  . folic acid  1 mg Per Tube Daily  . free water  300 mL Per Tube Q4H  . insulin aspart  0-15 Units Subcutaneous Q4H  . mouth rinse  15 mL Mouth Rinse q12n4p  . multivitamin  15 mL Per Tube Daily  . QUEtiapine  50 mg Per Tube QHS  . thiamine  100 mg Per Tube Daily   Continuous Infusions: . levETIRAcetam 500 mg (01/13/20 1301)     LOS: 126 days    Time spent: 35 minutes    Ashten Sarnowski A Pedro Oldenburg, MD [facility]  If 7PM-7AM, please contact night-coverage [email or web address] Password [facility] 01/13/2020, 2:58 PM

## 2020-01-13 NOTE — Progress Notes (Signed)
Patient refused all morning medications. Patient clearly stated "No" when trying to give medications through PEG tube. Will try again later. Dr. Tyrell Antonio notified. [place]

## 2020-01-13 NOTE — Progress Notes (Signed)
Patient still refusing all medications and clearly stating "no." Dr. Tyrell Antonio notified with verbal orders to give Joshua [name]  Patient trying to fight staff with left arm and leg. Patient's mother was contacted for decision making. Patient's mother wishes for her son to receive medications. She stated as long as her son wasn't being hurt, she will wished for him to receive the medication. [place]

## 2020-01-14 DIAGNOSIS — I611 Nontraumatic intracerebral hemorrhage in hemisphere, cortical: Secondary | ICD-10-CM | POA: Diagnosis not present

## 2020-01-14 LAB — GLUCOSE, CAPILLARY
Glucose-Capillary: 111 mg/dL — ABNORMAL HIGH (ref 70–99)
Glucose-Capillary: 126 mg/dL — ABNORMAL HIGH (ref 70–99)
Glucose-Capillary: 138 mg/dL — ABNORMAL HIGH (ref 70–99)
Glucose-Capillary: 219 mg/dL — ABNORMAL HIGH (ref 70–99)
Glucose-Capillary: 62 mg/dL — ABNORMAL LOW (ref 70–99)
Glucose-Capillary: 89 mg/dL (ref 70–99)
Glucose-Capillary: 92 mg/dL (ref 70–99)

## 2020-01-14 NOTE — Progress Notes (Signed)
PROGRESS NOTE    Joshua [name]  [id number] DOB: 10-03-1981 DOA: 09/09/2019 PCP: Patient, [name]   Brief Narrative: 39 year old male admitted by neurosurgery on 09/09/2019 for seizure, right hemiplegia and decreased responsiveness, found to have left hemispheric intracranial hemorrhage, intubated and  admitted to the ICU.  Underwent craniotomy for decompression on 09/09/2019 by Dr. Vertell Limber, patient  with  slow neurological recovery and prolonged hospitalization due to seizure and aspiration pneumonia.  Patient continued to be awake but with significant dysphagia.  PEG tube was placed on 09/25/2019, disposition continue to be challenging as he is uninsured and needs long-term placement.  Patient vomited tube feeding contents the night of 01/06/2019.  CT abdomen negative for obstruction. Currently tolerating tube feedings.   Assessment & Plan:   Principal Problem:   ICH (intracerebral hemorrhage) (HCC) Active Problems:   Subdural hematoma (HCC)   Intracerebral hemorrhage (HCC)   Cytotoxic brain edema (HCC)   Hemorrhagic stroke (HCC)   Acute respiratory failure ([place])   Essential hypertension   Severe protein-calorie malnutrition ([place])   CKD (chronic kidney disease), stage IV (HCC)   Seizure ([place])   AKI (acute kidney injury) ([place])   1-Left ICA: Patient underwent decompression craniotomy and hematoma evacuation on 09/09/2019, it was felt that he was likely due to uncontrolled hypertension, CTA did not show vascular malformation. Patient is a still aphasic and nonverbal with right hemiplegia and significant cognitive impairment. Dr. Vertell Limber did not recommend a replacement of skull bone.   2- Seizure; continue with Keppra twice daily.  Change to oral.  Refusing medication to be place in PEg tube today.  Change keppra to IV. Refuse oral meds   3-Aspiration pneumonia/dysphagia Completed course of IV antibiotic PEG tube in place Tolerating tube feeding.  4-Klebsiella UTI: Treated with  cefazolin  Hypertension: Continue with amlodipine, hydralazine and Lopressor  AKI on Chronic Kidney diseases III a;  Cr on admission up to 2.08 Cr decreased to 1.4--1.5 Continue with IV fluids for another 24 hours, patient received contrast for CT scan Cr stable at 1.3  Vomiting: Tube feeding content. Holding tube feeding. KUB amorphous material right.  CT abdomen and pelvis negative for obstruction or other abnormalities No further vomiting.  Tolerating tube feeding.   Vitamin D supplement.   Nutrition Problem: Severe Malnutrition Etiology: acute illness(intracerebral hemorrage)    Signs/Symptoms: severe fat depletion, severe muscle depletion    Interventions: Tube feeding, Refer to RD note for recommendations  Estimated body mass index is 16.93 kg/m as calculated from the following:   Height as of this encounter: 5\' 9"  (1.753 m).   Weight as of this encounter: 52 kg.   DVT prophylaxis: Lovenox Code Status: Full code Family Communication: no Family at bedside Disposition Plan: Waiting placement, evaluation for vomiting Consultants:   NeuroSurgery  Procedures:     Antimicrobials:  None currently  Subjective: Non verbal, alert.   Objective: Vitals:   01/13/20 2321 01/14/20 0000 01/14/20 0342 01/14/20 0748  BP: (!) 146/101 (!) 143/114 (!) 152/114 (!) 154/118  Pulse: (!) 118 (!) 106 (!) 110 (!) 107  Resp: 18 18 18 18   Temp: 98.5 F (36.9 C)  98.1 F (36.7 C) 98.5 F (36.9 C)  TempSrc: Oral  Oral Axillary  SpO2: 100%  100% 99%  Weight:      Height:        Intake/Output Summary (Last 24 hours) at 01/14/2020 1140 Last data filed at 01/13/2020 1542 Gross per 24 hour  Intake 100 ml  Output --  Net 100 ml   Filed Weights   01/07/20 0417 01/12/20 0636 01/13/20 0332  Weight: 58.1 kg 52 kg 52 kg    Examination:  General exam: NAD Respiratory system:  CTA Cardiovascular system: S 1, S 2 RRR Gastrointestinal system: Peg tube in place, BS Central  nervous system: alert, non verbal.  Extremities: no edema    Data Reviewed: I have personally reviewed following labs and imaging studies  CBC: Recent Labs  Lab 01/08/20 1011  WBC 7.9  HGB 12.3*  HCT 39.0  MCV 82.8  PLT Q000111Q   Basic Metabolic Panel: Recent Labs  Lab 01/08/20 1011 01/09/20 0751 01/10/20 0308  NA 142 144 140  K 4.3 4.0 4.0  CL 102 107 104  CO2 28 27 26   GLUCOSE 100* 82 135*  BUN 33* 31* 25*  CREATININE 1.55* 1.65* 1.39*  CALCIUM 10.5* 10.0 9.9   GFR: Estimated Creatinine Clearance: 52 mL/min (A) (by C-G formula based on SCr of 1.39 mg/dL (H)). Liver Function Tests: No results for input(s): AST, ALT, ALKPHOS, BILITOT, PROT, ALBUMIN in the last 168 hours. No results for input(s): LIPASE, AMYLASE in the last 168 hours. No results for input(s): AMMONIA in the last 168 hours. Coagulation Profile: No results for input(s): INR, PROTIME in the last 168 hours. Cardiac Enzymes: No results for input(s): CKTOTAL, CKMB, CKMBINDEX, TROPONINI in the last 168 hours. BNP (last 3 results) No results for input(s): PROBNP in the last 8760 hours. HbA1C: No results for input(s): HGBA1C in the last 72 hours. CBG: Recent Labs  Lab 01/13/20 1602 01/13/20 2020 01/13/20 2322 01/14/20 0406 01/14/20 0741  GLUCAP 76 118* 135* 89 92   Lipid Profile: No results for input(s): CHOL, HDL, LDLCALC, TRIG, CHOLHDL, LDLDIRECT in the last 72 hours. Thyroid Function Tests: No results for input(s): TSH, T4TOTAL, FREET4, T3FREE, THYROIDAB in the last 72 hours. Anemia Panel: No results for input(s): VITAMINB12, FOLATE, FERRITIN, TIBC, IRON, RETICCTPCT in the last 72 hours. Sepsis Labs: No results for input(s): PROCALCITON, LATICACIDVEN in the last 168 hours.  No results found for this or any previous visit (from the past 240 hour(s)).       Radiology Studies: No results found.      Scheduled Meds: . amLODipine  10 mg Per Tube Daily  . chlorhexidine  15 mL Mouth Rinse  BID  . cholecalciferol  1,000 Units Oral Daily  . enoxaparin (LOVENOX) injection  40 mg Subcutaneous Daily  . feeding supplement (OSMOLITE 1.2 CAL)  474 mL Per Tube TID AC & HS  . feeding supplement (PRO-STAT SUGAR FREE 64)  30 mL Per Tube BID  . folic acid  1 mg Per Tube Daily  . free water  300 mL Per Tube Q4H  . insulin aspart  0-15 Units Subcutaneous Q4H  . mouth rinse  15 mL Mouth Rinse q12n4p  . multivitamin  15 mL Per Tube Daily  . QUEtiapine  50 mg Per Tube QHS  . thiamine  100 mg Per Tube Daily   Continuous Infusions: . levETIRAcetam 500 mg (01/14/20 0100)     LOS: 127 days    Time spent: 35 minutes    Isabella Ida A Cesilia Shinn, MD [facility]  If 7PM-7AM, please contact night-coverage [email or web address] Password [email or web address] 01/14/2020, 11:40 AM

## 2020-01-15 DIAGNOSIS — I611 Nontraumatic intracerebral hemorrhage in hemisphere, cortical: Secondary | ICD-10-CM | POA: Diagnosis not present

## 2020-01-15 LAB — GLUCOSE, CAPILLARY
Glucose-Capillary: 104 mg/dL — ABNORMAL HIGH (ref 70–99)
Glucose-Capillary: 132 mg/dL — ABNORMAL HIGH (ref 70–99)
Glucose-Capillary: 154 mg/dL — ABNORMAL HIGH (ref 70–99)
Glucose-Capillary: 183 mg/dL — ABNORMAL HIGH (ref 70–99)
Glucose-Capillary: 85 mg/dL (ref 70–99)
Glucose-Capillary: 92 mg/dL (ref 70–99)

## 2020-01-15 LAB — BASIC METABOLIC PANEL
Anion gap: 11 (ref 5–15)
BUN: 27 mg/dL — ABNORMAL HIGH (ref 6–20)
CO2: 29 mmol/L (ref 22–32)
Calcium: 9.9 mg/dL (ref 8.9–10.3)
Chloride: 99 mmol/L (ref 98–111)
Creatinine, Ser: 1.3 mg/dL — ABNORMAL HIGH (ref 0.61–1.24)
GFR calc Af Amer: >60 mL/min (ref >60)
GFR calc non Af Amer: >60 mL/min (ref >60)
Glucose, Bld: 123 mg/dL — ABNORMAL HIGH (ref 70–99)
Potassium: 4.1 mmol/L (ref 3.5–5.1)
Sodium: 139 mmol/L (ref 135–145)

## 2020-01-15 LAB — CBC
HCT: 38.4 % — ABNORMAL LOW (ref 39.0–52.0)
Hemoglobin: 12.3 g/dL — ABNORMAL LOW (ref 13.0–17.0)
MCH: 26.1 pg (ref 26.0–34.0)
MCHC: 32 g/dL (ref 30.0–36.0)
MCV: 81.4 fL (ref 80.0–100.0)
Platelets: 272 10*3/uL (ref 150–400)
RBC: 4.72 MIL/uL (ref 4.22–5.81)
RDW: 14.4 % (ref 11.5–15.5)
WBC: 5.9 10*3/uL (ref 4.0–10.5)
nRBC: 0 % (ref 0.0–0.2)

## 2020-01-15 MED ORDER — HYDRALAZINE HCL 25 MG PO TABS
25.0000 mg | ORAL_TABLET | Freq: Three times a day (TID) | ORAL | Status: DC
  Administered 2020-01-15 – 2020-03-13 (×163): 25 mg
  Filled 2020-01-15 (×167): qty 1

## 2020-01-15 NOTE — Progress Notes (Signed)
PROGRESS NOTE    Joshua [name]  [id number] DOB: September 18, 1981 DOA: 09/09/2019 PCP: Patient, [name]   Brief Narrative: 39 year old male admitted by neurosurgery on 09/09/2019 for seizure, right hemiplegia and decreased responsiveness, found to have left hemispheric intracranial hemorrhage, intubated and  admitted to the ICU.  Underwent craniotomy for decompression on 09/09/2019 by Dr. Vertell Limber, patient  with  slow neurological recovery and prolonged hospitalization due to seizure and aspiration pneumonia.  Patient continued to be awake but with significant dysphagia.  PEG tube was placed on 09/25/2019, disposition continue to be challenging as he is uninsured and needs long-term placement.  Patient vomited tube feeding contents the night of 01/06/2019.  CT abdomen negative for obstruction. Currently tolerating tube feedings. Refuse medications at times.   Assessment & Plan:   Principal Problem:   ICH (intracerebral hemorrhage) (HCC) Active Problems:   Subdural hematoma (HCC)   Intracerebral hemorrhage (HCC)   Cytotoxic brain edema (HCC)   Hemorrhagic stroke (HCC)   Acute respiratory failure ([place])   Essential hypertension   Severe protein-calorie malnutrition ([place])   CKD (chronic kidney disease), stage IV (HCC)   Seizure ([place])   AKI (acute kidney injury) ([place])   1-Left ICA: Patient underwent decompression craniotomy and hematoma evacuation on 09/09/2019, it was felt that he was likely due to uncontrolled hypertension, CTA did not show vascular malformation. Patient is a still aphasic and nonverbal with right hemiplegia and significant cognitive impairment. Dr. Vertell Limber did not recommend a replacement of skull bone.   2- Seizure; continue with Keppra twice daily.  Change to oral.  Refusing medication to be place in PEg tube today.  Change keppra to IV. Was refusing meds through peg. Getting agitated with nurse.   3-Aspiration pneumonia/dysphagia Completed course of IV antibiotic PEG  tube in place Tolerating tube feeding.  4-Klebsiella UTI: Treated with cefazolin  Hypertension: Continue with amlodipine, hydralazine and Lopressor  AKI on Chronic Kidney diseases III a;  Cr on admission up to 2.08 Cr decreased to 1.4--1.5 Continue with IV fluids for another 24 hours, patient received contrast for CT scan Cr stable at 1.3  Vomiting: Tube feeding content. Holding tube feeding. KUB amorphous material right.  CT abdomen and pelvis negative for obstruction or other abnormalities No further vomiting.  Tolerating tube feeding.   Vitamin D supplement.   Nutrition Problem: Severe Malnutrition Etiology: acute illness(intracerebral hemorrage)    Signs/Symptoms: severe fat depletion, severe muscle depletion    Interventions: Tube feeding, Refer to RD note for recommendations  Estimated body mass index is 16.93 kg/m as calculated from the following:   Height as of this encounter: 5\' 9"  (1.753 m).   Weight as of this encounter: 52 kg.   DVT prophylaxis: Lovenox Code Status: Full code Family Communication: no Family at bedside Disposition Plan: Waiting placement.  Consultants:   NeuroSurgery  Procedures:     Antimicrobials:  None currently  Subjective: Non verbal, alert,   Objective: Vitals:   01/15/20 0429 01/15/20 0732 01/15/20 1235 01/15/20 1605  BP: (!) 140/113 (!) 143/108 116/84 (!) 134/112  Pulse: 95 99 (!) 110 97  Resp: 16 17 16 15   Temp: (!) 97.4 F (36.3 C) 98.1 F (36.7 C) 98.5 F (36.9 C) 98.1 F (36.7 C)  TempSrc: Oral Axillary Axillary Oral  SpO2: 97% 99% 100% 100%  Weight:      Height:       No intake or output data in the 24 hours ending 01/15/20 1702 Filed Weights   01/07/20  [id number] 01/12/20 0636 01/13/20 0332  Weight: 58.1 kg 52 kg 52 kg    Examination:  General exam: NAD Respiratory system:  CTA Cardiovascular system: S 1, S 2 RRR Gastrointestinal system: Peg tube in place Central nervous system: alert, non verbal,  not following command LE atrophy muscle.  Extremities; no edema    Data Reviewed: I have personally reviewed following labs and imaging studies  CBC: Recent Labs  Lab 01/15/20 0202  WBC 5.9  HGB 12.3*  HCT 38.4*  MCV 81.4  PLT [id number]   Basic Metabolic Panel: Recent Labs  Lab 01/09/20 0751 01/10/20 0308 01/15/20 0202  NA 144 140 139  K 4.0 4.0 4.1  CL 107 104 99  CO2 27 26 29   GLUCOSE 82 135* 123*  BUN 31* 25* 27*  CREATININE 1.65* 1.39* 1.30*  CALCIUM 10.0 9.9 9.9   GFR: Estimated Creatinine Clearance: 55.6 mL/min (A) (by C-G formula based on SCr of 1.3 mg/dL (H)). Liver Function Tests: No results for input(s): AST, ALT, ALKPHOS, BILITOT, PROT, ALBUMIN in the last 168 hours. No results for input(s): LIPASE, AMYLASE in the last 168 hours. No results for input(s): AMMONIA in the last 168 hours. Coagulation Profile: No results for input(s): INR, PROTIME in the last 168 hours. Cardiac Enzymes: No results for input(s): CKTOTAL, CKMB, CKMBINDEX, TROPONINI in the last 168 hours. BNP (last 3 results) No results for input(s): PROBNP in the last 8760 hours. HbA1C: No results for input(s): HGBA1C in the last 72 hours. CBG: Recent Labs  Lab 01/14/20 2331 01/15/20 0321 01/15/20 0842 01/15/20 1232 01/15/20 1607  GLUCAP 126* 92 183* 132* 85   Lipid Profile: No results for input(s): CHOL, HDL, LDLCALC, TRIG, CHOLHDL, LDLDIRECT in the last 72 hours. Thyroid Function Tests: No results for input(s): TSH, T4TOTAL, FREET4, T3FREE, THYROIDAB in the last 72 hours. Anemia Panel: No results for input(s): VITAMINB12, FOLATE, FERRITIN, TIBC, IRON, RETICCTPCT in the last 72 hours. Sepsis Labs: No results for input(s): PROCALCITON, LATICACIDVEN in the last 168 hours.  No results found for this or any previous visit (from the past 240 hour(s)).       Radiology Studies: No results found.      Scheduled Meds: . amLODipine  10 mg Per Tube Daily  . chlorhexidine  15 mL  Mouth Rinse BID  . cholecalciferol  1,000 Units Oral Daily  . enoxaparin (LOVENOX) injection  40 mg Subcutaneous Daily  . feeding supplement (OSMOLITE 1.2 CAL)  474 mL Per Tube TID AC & HS  . feeding supplement (PRO-STAT SUGAR FREE 64)  30 mL Per Tube BID  . folic acid  1 mg Per Tube Daily  . free water  300 mL Per Tube Q4H  . insulin aspart  0-15 Units Subcutaneous Q4H  . mouth rinse  15 mL Mouth Rinse q12n4p  . multivitamin  15 mL Per Tube Daily  . QUEtiapine  50 mg Per Tube QHS  . thiamine  100 mg Per Tube Daily   Continuous Infusions: . levETIRAcetam 500 mg (01/15/20 1302)     LOS: 128 days    Time spent: 35 minutes    Joshua [name] A Majorie Santee, MD [facility]  If 7PM-7AM, please contact night-coverage [email or web address] Password [facility] 01/15/2020, 5:02 PM

## 2020-01-16 DIAGNOSIS — I611 Nontraumatic intracerebral hemorrhage in hemisphere, cortical: Secondary | ICD-10-CM | POA: Diagnosis not present

## 2020-01-16 LAB — GLUCOSE, CAPILLARY
Glucose-Capillary: 65 mg/dL — ABNORMAL LOW (ref 70–99)
Glucose-Capillary: 71 mg/dL (ref 70–99)
Glucose-Capillary: 159 mg/dL — ABNORMAL HIGH (ref 70–99)
Glucose-Capillary: 167 mg/dL — ABNORMAL HIGH (ref 70–99)
Glucose-Capillary: 83 mg/dL (ref 70–99)

## 2020-01-16 MED ORDER — PRO-STAT SUGAR FREE PO LIQD
30.0000 mL | Freq: Three times a day (TID) | ORAL | Status: DC
  Administered 2020-01-16 – 2020-02-07 (×60): 30 mL
  Filled 2020-01-16 (×61): qty 30

## 2020-01-16 MED ORDER — LEVETIRACETAM 100 MG/ML PO SOLN
500.0000 mg | Freq: Two times a day (BID) | ORAL | Status: DC
  Administered 2020-01-16 – 2020-04-14 (×173): 500 mg
  Filled 2020-01-16 (×185): qty 5

## 2020-01-16 MED ORDER — GLUCAGON HCL RDNA (DIAGNOSTIC) 1 MG IJ SOLR
INTRAMUSCULAR | Status: AC
  Administered 2020-01-16: 1 mg
  Filled 2020-01-16: qty 1

## 2020-01-16 NOTE — Progress Notes (Signed)
Nutrition Follow-up  DOCUMENTATION CODES:   Severe malnutrition in context of acute illness/injury  INTERVENTION:  ContinueOsmolite 1.2 formula via PEG at goal volume of 474 ml (2 cartons/ARCs) given QID.   Provide 30 ml Prostat TID per tube.   Free water flushes of 300 ml every 4 hours per tube. (MD to adjust as appropriate)  Tube feeding to provide 2575 kcal, 150 grams of protein, and 3355 ml free water.  Continue PO intake for comfort as appropriate/tolerated.  NUTRITION DIAGNOSIS:   Severe Malnutrition related to acute illness(intracerebral hemorrage) as evidenced by severe fat depletion, severe muscle depletion; ongoing  GOAL:   Patient will meet greater than or equal to 90% of their needs; met with TF  MONITOR:   TF tolerance, Skin, Weight trends, Labs, I & O's  REASON FOR ASSESSMENT:   Ventilator, Consult Enteral/tube feeding initiation and management  ASSESSMENT:   39 yo male admitted with large left intracranial hemorrhage; S/P craniotomy. PMH includes HTN, alcoholism, noncompliance.  9/12left frontal temporal craniectomy and evacuation hematoma.  9/23 Extubated.  10/1 PEG placed 11/23- Nutrition Focused Physical Exam performed, severe subcutaneous fat loss and severe muscle wasting identified.   Pt has been tolerating his bolus tube feeds. RD to modify tube feeding orders to aid in increased caloric and protein needs. Pt refusing po at meals. Per MD, disposition continue to be challenging as he is uninsured and needs long-term placement. RD to continue to monitor for tolerance.    Labs and medications reviewed.   Diet Order:   Diet Order            Diet regular Room service appropriate? Yes; Fluid consistency: Thin  Diet effective now              EDUCATION NEEDS:   Not appropriate for education at this time  Skin:  Skin Assessment: Reviewed RN Assessment  Last BM:  1/19  Height:   Ht Readings from Last 1 Encounters:  09/09/19 5'  9" (1.753 m)    Weight:   Wt Readings from Last 1 Encounters:  01/16/20 53 kg    Ideal Body Weight:  72.7 kg  BMI:  Body mass index is 17.25 kg/m.  Estimated Nutritional Needs:   Kcal:  2200-2500  Protein:  120-150 grams  Fluid:  >/= 2 L/day    Corrin Parker, MS, RD, LDN Pager # [phone] After hours/ weekend pager # [phone]

## 2020-01-16 NOTE — Progress Notes (Signed)
Subsequent to glucagon administration, pt CBG 71. Will continue to monitor.

## 2020-01-16 NOTE — Progress Notes (Signed)
Physical Therapy Treatment Patient Details Name: Joshua [name] MRN: [id number] DOB: [birth year] Today's Date: 01/16/2020    History of Present Illness  39 y.o. 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 S/p L frontoparietal craniectomy for evacuation of L basal ganglia hematoma 9/12. ETT 9/12-9/18, 9/18-9/23. PEG tube placed 9/28 secondary to dysphagia. PMH includes alcohol abuse, seizures, HTN.    PT Comments    Pt did not kick at therapist today but remains physically resistant to mobility. Sat EOB with max +2 for sup<>sit and max A to maintain sitting. Pt would not let go of bed rail to allow full, upright sitting. Mod A to return to supine. Music played during session which pt seemed to enjoy somewhat. PT will continue to follow at 1x/wk.     Follow Up Recommendations  SNF;Supervision/Assistance - 24 hour     Equipment Recommendations  Wheelchair (measurements PT);Wheelchair cushion (measurements PT);Hospital bed;3in1 (PT)    Recommendations for Other Services       Precautions / Restrictions Precautions Precautions: Fall Precaution Comments: L crani, PEG, helmet Required Braces or Orthoses: Splint/Cast Splint/Cast: resting hand splint and elbow splint for R UE due synergistic pattern Restrictions Weight Bearing Restrictions: No    Mobility  Bed Mobility Overal bed mobility: Needs Assistance Bed Mobility: Supine to Sit;Sit to Supine Rolling: Max assist Sidelying to sit: Max assist;+2 for physical assistance   Sit to supine: Mod assist   General bed mobility comments: pt allowed LE's to be brought off EOB, HOB elevated and pt sat away from [facility] with max A, strong grasp to bed rail and would not let go with verbal or tactile cues. Maintained sitting >5 min with max A. When not given assist, pt self lowered trunk to bed, needed mod A to get lower LE back into bed.   Transfers                 General transfer comment:  pt not following commands and resisting mvmt too much to transfer to chair, lift will need to be used  Ambulation/Gait                 Stairs             Wheelchair Mobility    Modified Rankin (Stroke Patients Only) Modified Rankin (Stroke Patients Only) Pre-Morbid Rankin Score: No symptoms Modified Rankin: Severe disability     Balance Overall balance assessment: Needs assistance Sitting-balance support: Single extremity supported;Feet supported Sitting balance-Leahy Scale: Zero Sitting balance - Comments: max A to maintain sitting Postural control: Right lateral lean;Posterior lean                                  Cognition Arousal/Alertness: Awake/alert Behavior During Therapy: Flat affect Overall Cognitive Status: Difficult to assess Area of Impairment: Attention;Following commands;Safety/judgement;Awareness;Problem solving                   Current Attention Level: Focused Memory: Decreased short-term memory Following Commands: Follows one step commands inconsistently Safety/Judgement: Decreased awareness of safety;Decreased awareness of deficits Awareness: Intellectual Problem Solving: Slow processing;Difficulty sequencing;Requires verbal cues;Decreased initiation;Requires tactile cues General Comments: pt generally resistant to mobility and non interactive      Exercises      General Comments General comments (skin integrity, edema, etc.): STM to B hamstrings as pt keeping knees bent up to self in bed  Pertinent Vitals/Pain Pain Assessment: Faces Faces Pain Scale: Hurts a little bit Pain Location: general discomfort with movement, guarding and resisting assist Pain Descriptors / Indicators: Discomfort;Guarding Pain Intervention(s): Monitored during session    Home Living                      Prior Function            PT Goals (current goals can now be found in the care plan section) Acute Rehab PT  Goals Patient Stated Goal: none stated PT Goal Formulation: Patient unable to participate in goal setting Time For Goal Achievement: 01/23/20 Potential to Achieve Goals: Fair Progress towards PT goals: Not progressing toward goals - comment(resistance)    Frequency    Min 1X/week      PT Plan Current plan remains appropriate    Co-evaluation              AM-PAC PT "6 Clicks" Mobility   Outcome Measure  Help needed turning from your back to your side while in a flat bed without using bedrails?: Total Help needed moving from lying on your back to sitting on the side of a flat bed without using bedrails?: Total Help needed moving to and from a bed to a chair (including a wheelchair)?: Total Help needed standing up from a chair using your arms (e.g., wheelchair or bedside chair)?: Total Help needed to walk in hospital room?: Total Help needed climbing 3-5 steps with a railing? : Total 6 Click Score: 6    End of Session   Activity Tolerance: Other (comment)(cognition) Patient left: in bed;with call bell/phone within reach Nurse Communication: Mobility status PT Visit Diagnosis: Muscle weakness (generalized) (M62.81);Hemiplegia and hemiparesis;Other abnormalities of gait and mobility (R26.89) Hemiplegia - Right/Left: Right Hemiplegia - dominant/non-dominant: Dominant Hemiplegia - caused by: Nontraumatic intracerebral hemorrhage     Time: [id number] PT Time Calculation (min) (ACUTE ONLY): 16 min  Charges:  $Therapeutic Activity: 8-22 mins                     [place]  Pager [phone] Office [place] 01/16/2020, 10:57 AM

## 2020-01-16 NOTE — Progress Notes (Signed)
PROGRESS NOTE    Joshua [name]  [id number] DOB: [birth year] DOA: 09/09/2019 PCP: Patient, [name]   Brief Narrative: 39 year old male admitted by neurosurgery on 09/09/2019 for seizure, right hemiplegia and decreased responsiveness, found to have left hemispheric intracranial hemorrhage, intubated and  admitted to the ICU.  Underwent craniotomy for decompression on 09/09/2019 by Dr. Vertell Limber, patient  with  slow neurological recovery and prolonged hospitalization due to seizure and aspiration pneumonia.  Patient continued to be awake but with significant dysphagia.  PEG tube was placed on 09/25/2019, disposition continue to be challenging as he is uninsured and needs long-term placement.  Patient vomited tube feeding contents the night of 01/06/2019.  CT abdomen negative for obstruction. Currently tolerating tube feedings. Refuse medications at times. He has remain stable, awaiting placement.   Assessment & Plan:   Principal Problem:   ICH (intracerebral hemorrhage) (HCC) Active Problems:   Subdural hematoma (HCC)   Intracerebral hemorrhage (HCC)   Cytotoxic brain edema (HCC)   Hemorrhagic stroke (HCC)   Acute respiratory failure ([place])   Essential hypertension   Severe protein-calorie malnutrition ([place])   CKD (chronic kidney disease), stage IV (HCC)   Seizure ([place])   AKI (acute kidney injury) ([place])   1-Left ICA: Patient underwent decompression craniotomy and hematoma evacuation on 09/09/2019, it was felt that he was likely due to uncontrolled hypertension, CTA did not show vascular malformation. Patient is a still aphasic and nonverbal with right hemiplegia and significant cognitive impairment. Dr. Vertell Limber did not recommend a replacement of skull bone.   2- Seizure; continue with Keppra twice daily.  Change to oral.  Refusing medication to be place in PEg tube during weekend. He received IV keppra.  Back on oral keppra.   3-Aspiration pneumonia/dysphagia Completed course of IV  antibiotic PEG tube in place Tolerating tube feeding.  4-Klebsiella UTI: Treated with cefazolin  Hypertension: Continue with amlodipine, hydralazine and Lopressor  AKI on Chronic Kidney diseases III a;  Cr on admission up to 2.08 Cr decreased to 1.4--1.5 Continue with IV fluids for another 24 hours, patient received contrast for CT scan Cr stable at 1.3  Vomiting: Tube feeding content. Holding tube feeding. KUB amorphous material right.  CT abdomen and pelvis negative for obstruction or other abnormalities No further vomiting.  Tolerating tube feeding.   Vitamin D supplement ordered.   Nutrition Problem: Severe Malnutrition Etiology: acute illness(intracerebral hemorrage)    Signs/Symptoms: severe fat depletion, severe muscle depletion    Interventions: Tube feeding, Refer to RD note for recommendations  Estimated body mass index is 17.25 kg/m as calculated from the following:   Height as of this encounter: 5\' 9"  (1.753 m).   Weight as of this encounter: 53 kg.   DVT prophylaxis: Lovenox Code Status: Full code Family Communication: no Family at bedside Disposition Plan: Waiting placement.  Consultants:   NeuroSurgery  Procedures:     Antimicrobials:  None currently  Subjective: Alert, non verbal.   Objective: Vitals:   01/16/20 0355 01/16/20 0500 01/16/20 0809 01/16/20 1124  BP: (!) 143/78  135/90 (!) 147/101  Pulse: 90  (!) 110 (!) 110  Resp: 15  14 18   Temp: 98.4 F (36.9 C)  98.3 F (36.8 C) 98.3 F (36.8 C)  TempSrc: Oral  Oral Oral  SpO2: 100%   100%  Weight:  53 kg    Height:       No intake or output data in the 24 hours ending 01/16/20 1505 Filed Weights   01/12/20  [id number] 01/13/20 0332 01/16/20 0500  Weight: 52 kg 52 kg 53 kg    Examination:  General exam: NAD Respiratory system:  CTA Cardiovascular system: S 1, S 2 RRR Gastrointestinal system: BS present, peg tube in place.  Central nervous system: Alert, non verbal , not  following command. Muscle atrophy.  Extremities; No edema.     Data Reviewed: I have personally reviewed following labs and imaging studies  CBC: Recent Labs  Lab 01/15/20 0202  WBC 5.9  HGB 12.3*  HCT 38.4*  MCV 81.4  PLT Q000111Q   Basic Metabolic Panel: Recent Labs  Lab 01/10/20 0308 01/15/20 0202  NA 140 139  K 4.0 4.1  CL 104 99  CO2 26 29  GLUCOSE 135* 123*  BUN 25* 27*  CREATININE 1.39* 1.30*  CALCIUM 9.9 9.9   GFR: Estimated Creatinine Clearance: 56.6 mL/min (A) (by C-G formula based on SCr of 1.3 mg/dL (H)). Liver Function Tests: No results for input(s): AST, ALT, ALKPHOS, BILITOT, PROT, ALBUMIN in the last 168 hours. No results for input(s): LIPASE, AMYLASE in the last 168 hours. No results for input(s): AMMONIA in the last 168 hours. Coagulation Profile: No results for input(s): INR, PROTIME in the last 168 hours. Cardiac Enzymes: No results for input(s): CKTOTAL, CKMB, CKMBINDEX, TROPONINI in the last 168 hours. BNP (last 3 results) No results for input(s): PROBNP in the last 8760 hours. HbA1C: No results for input(s): HGBA1C in the last 72 hours. CBG: Recent Labs  Lab 01/15/20 2332 01/16/20 0408 01/16/20 0445 01/16/20 0808 01/16/20 1122  GLUCAP 154* 65* 71 159* 167*   Lipid Profile: No results for input(s): CHOL, HDL, LDLCALC, TRIG, CHOLHDL, LDLDIRECT in the last 72 hours. Thyroid Function Tests: No results for input(s): TSH, T4TOTAL, FREET4, T3FREE, THYROIDAB in the last 72 hours. Anemia Panel: No results for input(s): VITAMINB12, FOLATE, FERRITIN, TIBC, IRON, RETICCTPCT in the last 72 hours. Sepsis Labs: No results for input(s): PROCALCITON, LATICACIDVEN in the last 168 hours.  No results found for this or any previous visit (from the past 240 hour(s)).       Radiology Studies: No results found.      Scheduled Meds: . amLODipine  10 mg Per Tube Daily  . chlorhexidine  15 mL Mouth Rinse BID  . cholecalciferol  1,000 Units Oral  Daily  . enoxaparin (LOVENOX) injection  40 mg Subcutaneous Daily  . feeding supplement (OSMOLITE 1.2 CAL)  474 mL Per Tube TID AC & HS  . feeding supplement (PRO-STAT SUGAR FREE 64)  30 mL Per Tube TID  . folic acid  1 mg Per Tube Daily  . free water  300 mL Per Tube Q4H  . hydrALAZINE  25 mg Per Tube Q8H  . insulin aspart  0-15 Units Subcutaneous Q4H  . levETIRAcetam  500 mg Per Tube BID  . mouth rinse  15 mL Mouth Rinse q12n4p  . multivitamin  15 mL Per Tube Daily  . QUEtiapine  50 mg Per Tube QHS  . thiamine  100 mg Per Tube Daily   Continuous Infusions:    LOS: 129 days    Time spent: 35 minutes    Angeleah Labrake A Rayleen Wyrick, MD [facility]  If 7PM-7AM, please contact night-coverage [email or web address] Password [facility] 01/16/2020, 3:05 PM

## 2020-01-16 NOTE — Progress Notes (Signed)
Pt was given 1mg  glucagon for CBG of 65. Will monitor CBG for improvement.

## 2020-01-17 DIAGNOSIS — I612 Nontraumatic intracerebral hemorrhage in hemisphere, unspecified: Secondary | ICD-10-CM | POA: Diagnosis not present

## 2020-01-17 DIAGNOSIS — I619 Nontraumatic intracerebral hemorrhage, unspecified: Secondary | ICD-10-CM | POA: Diagnosis not present

## 2020-01-17 DIAGNOSIS — N179 Acute kidney failure, unspecified: Secondary | ICD-10-CM | POA: Diagnosis not present

## 2020-01-17 DIAGNOSIS — I10 Essential (primary) hypertension: Secondary | ICD-10-CM | POA: Diagnosis not present

## 2020-01-17 LAB — GLUCOSE, CAPILLARY
Glucose-Capillary: 147 mg/dL — ABNORMAL HIGH (ref 70–99)
Glucose-Capillary: 182 mg/dL — ABNORMAL HIGH (ref 70–99)
Glucose-Capillary: 73 mg/dL (ref 70–99)
Glucose-Capillary: 77 mg/dL (ref 70–99)
Glucose-Capillary: 79 mg/dL (ref 70–99)
Glucose-Capillary: 85 mg/dL (ref 70–99)
Glucose-Capillary: 90 mg/dL (ref 70–99)

## 2020-01-17 LAB — CBC
HCT: 39.9 % (ref 39.0–52.0)
Hemoglobin: 12.8 g/dL — ABNORMAL LOW (ref 13.0–17.0)
MCH: 26.2 pg (ref 26.0–34.0)
MCHC: 32.1 g/dL (ref 30.0–36.0)
MCV: 81.6 fL (ref 80.0–100.0)
Platelets: 268 10*3/uL (ref 150–400)
RBC: 4.89 MIL/uL (ref 4.22–5.81)
RDW: 14.7 % (ref 11.5–15.5)
WBC: 7.1 10*3/uL (ref 4.0–10.5)
nRBC: 0 % (ref 0.0–0.2)

## 2020-01-17 LAB — BASIC METABOLIC PANEL
Anion gap: 11 (ref 5–15)
BUN: 31 mg/dL — ABNORMAL HIGH (ref 6–20)
CO2: 27 mmol/L (ref 22–32)
Calcium: 10.1 mg/dL (ref 8.9–10.3)
Chloride: 100 mmol/L (ref 98–111)
Creatinine, Ser: 1.23 mg/dL (ref 0.61–1.24)
GFR calc Af Amer: >60 mL/min (ref >60)
GFR calc non Af Amer: >60 mL/min (ref >60)
Glucose, Bld: 77 mg/dL (ref 70–99)
Potassium: 4.1 mmol/L (ref 3.5–5.1)
Sodium: 138 mmol/L (ref 135–145)

## 2020-01-17 NOTE — Progress Notes (Signed)
[facility]  PROGRESS NOTE  Joshua [name] [id number] DOB: [birth year] DOA: 09/09/2019 PCP: Patient, [name]   Brief HPI:   39 year old male admitted by neurosurgery on 09/09/2019 for seizure, right hemiplegia and decreased responsiveness, found to have left hemispheric intracranial hemorrhage, intubated and admitted to ICU.  Underwent craniotomy for decompression on 09/09/2019 by Dr. Vertell Limber, patient showed slow neurological recovery and prolonged hospitalization due to seizure and aspiration pneumonia.  Patient continued to be awake but significant dysphagia, PEG tube was placed on 09/25/2019.  Placement has been challenging as patient is uninsured and needs long-term care.  Patient vomited tube feeding contents on the night of 01/06/2019.  CT abdomen was negative for obstruction.  Currently patient is tolerating tube feedings.  Awaiting placement.    Subjective   Patient seen and examined, continues to be nonverbal.   Assessment/Plan:     1. Left ICH-patient underwent decompressive craniectomy and hematoma evacuation on 09/09/2019.  Likely from uncontrolled hypertension.  CT did not show vascular malformation.  Patient continues to be aphasic and nonverbal with right hemiplegia and significant cognitive impairment.  Dr. Joaquim Lai did not recommend replacement of skull bone due to lack of progression.  2. Seizure disorder-continue Keppra.  3. Aspiration pneumonia/dysphagia-completed a course of IV antibiotics.  PEG tube in place.  Tolerating tube feeding.  4. Klebsiella UTI-treated with cefazolin.  5. Hypertension-continue manidipine, hydralazine, Lopressor.  6. AKI on chronic kidney disease IIIa-creatinine on admission up to 2.08.  Creatinine decreased to 1.4-1.5.  Currently creatinine is 1.23, at baseline.  7. Vomiting-patient had episode of vomiting contents of tube feeding.  CT abdomen pelvis obtained which was negative for obstruction.  No further vomiting.  Tube feeding was  restarted.    SpO2: 100 % O2 Flow Rate (L/min): 0 L/min FiO2 (%): 40 %     Lab Results  Component Value Date   SARSCOV2NAA NEGATIVE 09/09/2019     CBG: Recent Labs  Lab 01/16/20 2053 01/16/20 2351 01/17/20 0320 01/17/20 0808 01/17/20 1141  GLUCAP 83 182* 73 147* 79    CBC: Recent Labs  Lab 01/15/20 0202 01/17/20 0248  WBC 5.9 7.1  HGB 12.3* 12.8*  HCT 38.4* 39.9  MCV 81.4 81.6  PLT 272 [id number]    Basic Metabolic Panel: Recent Labs  Lab 01/15/20 0202 01/17/20 0248  NA 139 138  K 4.1 4.1  CL 99 100  CO2 29 27  GLUCOSE 123* 77  BUN 27* 31*  CREATININE 1.30* 1.23  CALCIUM 9.9 10.1     Liver Function Tests: No results for input(s): AST, ALT, ALKPHOS, BILITOT, PROT, ALBUMIN in the last 168 hours.      DVT prophylaxis: Lovenox  Code Status: Full code  Family Communication: No family at bedside  Disposition Plan: Awaiting placement     Scheduled medications:  . amLODipine  10 mg Per Tube Daily  . chlorhexidine  15 mL Mouth Rinse BID  . cholecalciferol  1,000 Units Oral Daily  . enoxaparin (LOVENOX) injection  40 mg Subcutaneous Daily  . feeding supplement (OSMOLITE 1.2 CAL)  474 mL Per Tube TID AC & HS  . feeding supplement (PRO-STAT SUGAR FREE 64)  30 mL Per Tube TID  . folic acid  1 mg Per Tube Daily  . free water  300 mL Per Tube Q4H  . hydrALAZINE  25 mg Per Tube Q8H  . insulin aspart  0-15 Units Subcutaneous Q4H  . levETIRAcetam  500 mg Per Tube BID  . mouth rinse  15 mL Mouth Rinse q12n4p  . multivitamin  15 mL Per Tube Daily  . QUEtiapine  50 mg Per Tube QHS  . thiamine  100 mg Per Tube Daily    Consultants:  Neurosurgery  Procedures:  Decompressive craniectomy and hematoma evacuation, implantation of skull flap in right abdomen  Antibiotics:   Anti-infectives (From admission, onward)   Start     Dose/Rate Route Frequency Ordered Stop   09/27/19 1130  cephALEXin (KEFLEX) 250 MG/5ML suspension 500 mg     500 mg Per Tube  Every 12 hours 09/27/19 1052 09/29/19 2250   09/25/19 1445  ceFAZolin (ANCEF) IVPB 2g/100 mL premix  Status:  Discontinued     2 g 200 mL/hr over 30 Minutes Intravenous Every 12 hours 09/25/19 1417 09/27/19 1052   09/14/19 1000  Ampicillin-Sulbactam (UNASYN) 3 g in sodium chloride 0.9 % 100 mL IVPB     3 g 200 mL/hr over 30 Minutes Intravenous Every 8 hours 09/14/19 0922 09/17/19 1816   09/13/19 1100  vancomycin (VANCOCIN) IVPB 750 mg/150 ml premix  Status:  Discontinued     750 mg 150 mL/hr over 60 Minutes Intravenous Every 24 hours 09/13/19 0728 09/13/19 0838   09/12/19 1100  vancomycin (VANCOCIN) IVPB 1000 mg/200 mL premix  Status:  Discontinued     1,000 mg 200 mL/hr over 60 Minutes Intravenous Every 24 hours 09/11/19 1034 09/13/19 0728   09/11/19 1130  piperacillin-tazobactam (ZOSYN) IVPB 3.375 g  Status:  Discontinued     3.375 g 12.5 mL/hr over 240 Minutes Intravenous Every 8 hours 09/11/19 1054 09/14/19 0845   09/11/19 1100  vancomycin (VANCOCIN) 1,500 mg in sodium chloride 0.9 % 500 mL IVPB     1,500 mg 250 mL/hr over 120 Minutes Intravenous  Once 09/11/19 1034 09/11/19 1900   09/11/19 1015  piperacillin-tazobactam (ZOSYN) IVPB 4.5 g  Status:  Discontinued     4.5 g 200 mL/hr over 30 Minutes Intravenous Every 8 hours 09/11/19 1010 09/11/19 1052   09/09/19 0845  ceFAZolin (ANCEF) IVPB 2g/100 mL premix     2 g 200 mL/hr over 30 Minutes Intravenous Every 8 hours 09/09/19 0842 09/09/19 1658       Objective   Vitals:   01/16/20 2355 01/17/20 0323 01/17/20 0424 01/17/20 0901  BP: (!) 141/104 (!) 153/108  (!) 133/106  Pulse: (!) 126 100  (!) 107  Resp: 17 17  16   Temp: 98.4 F (36.9 C) (!) 97.5 F (36.4 C)  98 F (36.7 C)  TempSrc: Oral Oral  Oral  SpO2: 97% 100%  100%  Weight:   54.3 kg   Height:        Intake/Output Summary (Last 24 hours) at 01/17/2020 1452 Last data filed at 01/17/2020 1246 Gross per 24 hour  Intake 0 ml  Output --  Net 0 ml    No  intake/output data recorded.  Filed Weights   01/13/20 0332 01/16/20 0500 01/17/20 0424  Weight: 52 kg 53 kg 54.3 kg    Physical Examination:    General: Appears in no acute distress  Cardiovascular: S1-S2, regular, no murmur auscultated  Respiratory: Clear to auscultation bilaterally, no wheezing or crackles auscultated  Abdomen: Abdomen is soft, nontender, no organomegaly  Extremities: Muscle atrophy noted in lower extremities  Neurologic: Alert, not following commands, aphasia    Admission status: Inpatient: Based on patients clinical presentation and evaluation of above clinical data, I have made determination that patient meets Inpatient criteria at this time.  Oswald Hillock   [facility] If 7PM-7AM, please contact night-coverage at [email or web address], Office  [phone]  password [email or web address]  01/17/2020, 2:52 PM  LOS: 130 days

## 2020-01-18 DIAGNOSIS — N179 Acute kidney failure, unspecified: Secondary | ICD-10-CM | POA: Diagnosis not present

## 2020-01-18 DIAGNOSIS — Z4659 Encounter for fitting and adjustment of other gastrointestinal appliance and device: Secondary | ICD-10-CM | POA: Diagnosis not present

## 2020-01-18 DIAGNOSIS — G936 Cerebral edema: Secondary | ICD-10-CM | POA: Diagnosis not present

## 2020-01-18 DIAGNOSIS — J9601 Acute respiratory failure with hypoxia: Secondary | ICD-10-CM | POA: Diagnosis not present

## 2020-01-18 LAB — GLUCOSE, CAPILLARY
Glucose-Capillary: 103 mg/dL — ABNORMAL HIGH (ref 70–99)
Glucose-Capillary: 127 mg/dL — ABNORMAL HIGH (ref 70–99)
Glucose-Capillary: 128 mg/dL — ABNORMAL HIGH (ref 70–99)
Glucose-Capillary: 189 mg/dL — ABNORMAL HIGH (ref 70–99)
Glucose-Capillary: 201 mg/dL — ABNORMAL HIGH (ref 70–99)
Glucose-Capillary: 67 mg/dL — ABNORMAL LOW (ref 70–99)
Glucose-Capillary: 89 mg/dL (ref 70–99)
Glucose-Capillary: 95 mg/dL (ref 70–99)

## 2020-01-18 MED ORDER — GLUCOSE 40 % PO GEL
ORAL | Status: AC
  Filled 2020-01-18: qty 1

## 2020-01-18 NOTE — Progress Notes (Signed)
At 0340 pt CBG was 67. 8 oz orange juice was administered per tube. On re-check, CBG is now 103. Will continue to monitor patient.

## 2020-01-18 NOTE — Progress Notes (Signed)
Occupational Therapy Treatment Patient Details Name: Cleaster Domenick MRN: [id number] DOB: 04-15-1981 Today's Date: 01/18/2020    History of present illness 39 y.o. 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 S/p L frontoparietal craniectomy for evacuation of L basal ganglia hematoma 9/12. ETT 9/12-9/18, 9/18-9/23. PEG tube placed 9/28 secondary to dysphagia. PMH includes alcohol abuse, seizures, HTN.   OT comments  Pt very resistant to movement or even touch.  Attempted to engage in stretch/PROM to elbow, wrist, and hand.  Pt resistant to allowing therapist or family member even touch pt.  Educated pt and family member on need to wear hand splint for increased finger and wrist extension as well as need for hygiene and nail care to Rt hand to prevent breakdown - family member reporting understanding.  However pt would not allow therapist or family member to even touch his Rt arm.  Pt frequently stating "no" throughout session.  Notified RN that pt not wearing resting hand splint and encouraged wearing schedule.   Follow Up Recommendations  SNF;Supervision/Assistance - 24 hour    Equipment Recommendations  Wheelchair cushion (measurements OT);Wheelchair (measurements OT);Hospital bed       Precautions / Restrictions Precautions Precautions: Fall Precaution Comments: L crani, PEG, helmet Required Braces or Orthoses: Splint/Cast Splint/Cast: resting hand splint and elbow splint for R UE due synergistic pattern Restrictions Weight Bearing Restrictions: No       Mobility Bed Mobility  refused                Transfers  refused                          Frequency  Min 1X/week        Progress Toward Goals  OT Goals(current goals can now be found in the care plan section)  Progress towards OT goals: Progressing toward goals  Acute Rehab OT Goals Patient Stated Goal: none stated OT Goal Formulation: Patient unable to  participate in goal setting Time For Goal Achievement: 01/26/20 Potential to Achieve Goals: Good  Plan Discharge plan remains appropriate       AM-PAC OT "6 Clicks" Daily Activity     Outcome Measure   Help from another person eating meals?: A Lot Help from another person taking care of personal grooming?: A Lot Help from another person toileting, which includes using toliet, bedpan, or urinal?: A Lot Help from another person bathing (including washing, rinsing, drying)?: Total Help from another person to put on and taking off regular upper body clothing?: Total Help from another person to put on and taking off regular lower body clothing?: Total 6 Click Score: 9    End of Session Equipment Utilized During Treatment: Gait belt  OT Visit Diagnosis: Other abnormalities of gait and mobility (R26.89);Hemiplegia and hemiparesis;Cognitive communication deficit (R41.841) Symptoms and signs involving cognitive functions: Other Nontraumatic ICH Hemiplegia - Right/Left: Right Hemiplegia - caused by: Other Nontraumatic intracranial hemorrhage   Activity Tolerance Treatment limited secondary to agitation   Patient Left in bed;with call bell/phone within reach;with nursing/sitter in room   Nurse Communication Mobility status(tone in RUE)        Time: [id number] OT Time Calculation (min): 11 min  Charges: OT General Charges $OT Visit: 1 Visit OT Treatments $Self Care/Home Management : 8-22 mins    Simonne Come [phone] 01/18/2020, 3:42 PM

## 2020-01-18 NOTE — Progress Notes (Signed)
[facility]  PROGRESS NOTE  Joshua [name] [id number] DOB: July 14, 1981 DOA: 09/09/2019 PCP: Patient, [name]   Brief HPI:   39 year old male admitted by neurosurgery on 09/09/2019 for seizure, right hemiplegia and decreased responsiveness, found to have left hemispheric intracranial hemorrhage, intubated and admitted to ICU.  Underwent craniotomy for decompression on 09/09/2019 by Dr. Vertell Limber, patient showed slow neurological recovery and prolonged hospitalization due to seizure and aspiration pneumonia.  Patient continued to be awake but significant dysphagia, PEG tube was placed on 09/25/2019.  Placement has been challenging as patient is uninsured and needs long-term care.  Patient vomited tube feeding contents on the night of 01/06/2019.  CT abdomen was negative for obstruction.  Currently patient is tolerating tube feedings.  Awaiting placement.    Subjective   Patient seen and examined, continues to be nonverbal.   Assessment/Plan:     1. Left ICH-patient underwent decompressive craniectomy and hematoma evacuation on 09/09/2019.  Likely from uncontrolled hypertension.  CT did not show vascular malformation.  Patient continues to be aphasic and nonverbal with right hemiplegia and significant cognitive impairment.  Dr. Vertell Limber did not recommend replacement of skull bone due to lack of progression.  2. Seizure disorder-continue Keppra.  3. Aspiration pneumonia/dysphagia-completed a course of IV antibiotics.  PEG tube in place.  Tolerating tube feeding.  4. Klebsiella UTI-treated with cefazolin.  5. Hypertension-continue manidipine, hydralazine, Lopressor.  6. AKI on chronic kidney disease IIIa-creatinine on admission up to 2.08.  Creatinine decreased to 1.4-1.5.  Currently creatinine is 1.23, at baseline.  7. Vomiting-patient had episode of vomiting contents of tube feeding.  CT abdomen pelvis obtained which was negative for obstruction.  No further vomiting.  Tube feeding was  restarted.    SpO2: 100 % O2 Flow Rate (L/min): 0 L/min FiO2 (%): 40 %     Lab Results  Component Value Date   SARSCOV2NAA NEGATIVE 09/09/2019     CBG: Recent Labs  Lab 01/18/20 0006 01/18/20 0340 01/18/20 0428 01/18/20 0731 01/18/20 1132  GLUCAP 189* 67* 103* 201* 95    CBC: Recent Labs  Lab 01/15/20 0202 01/17/20 0248  WBC 5.9 7.1  HGB 12.3* 12.8*  HCT 38.4* 39.9  MCV 81.4 81.6  PLT 272 [id number]    Basic Metabolic Panel: Recent Labs  Lab 01/15/20 0202 01/17/20 0248  NA 139 138  K 4.1 4.1  CL 99 100  CO2 29 27  GLUCOSE 123* 77  BUN 27* 31*  CREATININE 1.30* 1.23  CALCIUM 9.9 10.1     Liver Function Tests: No results for input(s): AST, ALT, ALKPHOS, BILITOT, PROT, ALBUMIN in the last 168 hours.      DVT prophylaxis: Lovenox  Code Status: Full code  Family Communication: No family at bedside  Disposition Plan: Awaiting placement     Scheduled medications:  . amLODipine  10 mg Per Tube Daily  . chlorhexidine  15 mL Mouth Rinse BID  . cholecalciferol  1,000 Units Oral Daily  . enoxaparin (LOVENOX) injection  40 mg Subcutaneous Daily  . feeding supplement (OSMOLITE 1.2 CAL)  474 mL Per Tube TID AC & HS  . feeding supplement (PRO-STAT SUGAR FREE 64)  30 mL Per Tube TID  . folic acid  1 mg Per Tube Daily  . free water  300 mL Per Tube Q4H  . hydrALAZINE  25 mg Per Tube Q8H  . insulin aspart  0-15 Units Subcutaneous Q4H  . levETIRAcetam  500 mg Per Tube BID  . mouth rinse  15 mL Mouth Rinse q12n4p  . multivitamin  15 mL Per Tube Daily  . QUEtiapine  50 mg Per Tube QHS  . thiamine  100 mg Per Tube Daily    Consultants:  Neurosurgery  Procedures:  Decompressive craniectomy and hematoma evacuation, implantation of skull flap in right abdomen  Antibiotics:   Anti-infectives (From admission, onward)   Start     Dose/Rate Route Frequency Ordered Stop   09/27/19 1130  cephALEXin (KEFLEX) 250 MG/5ML suspension 500 mg     500 mg Per  Tube Every 12 hours 09/27/19 1052 09/29/19 2250   09/25/19 1445  ceFAZolin (ANCEF) IVPB 2g/100 mL premix  Status:  Discontinued     2 g 200 mL/hr over 30 Minutes Intravenous Every 12 hours 09/25/19 1417 09/27/19 1052   09/14/19 1000  Ampicillin-Sulbactam (UNASYN) 3 g in sodium chloride 0.9 % 100 mL IVPB     3 g 200 mL/hr over 30 Minutes Intravenous Every 8 hours 09/14/19 0922 09/17/19 1816   09/13/19 1100  vancomycin (VANCOCIN) IVPB 750 mg/150 ml premix  Status:  Discontinued     750 mg 150 mL/hr over 60 Minutes Intravenous Every 24 hours 09/13/19 0728 09/13/19 0838   09/12/19 1100  vancomycin (VANCOCIN) IVPB 1000 mg/200 mL premix  Status:  Discontinued     1,000 mg 200 mL/hr over 60 Minutes Intravenous Every 24 hours 09/11/19 1034 09/13/19 0728   09/11/19 1130  piperacillin-tazobactam (ZOSYN) IVPB 3.375 g  Status:  Discontinued     3.375 g 12.5 mL/hr over 240 Minutes Intravenous Every 8 hours 09/11/19 1054 09/14/19 0845   09/11/19 1100  vancomycin (VANCOCIN) 1,500 mg in sodium chloride 0.9 % 500 mL IVPB     1,500 mg 250 mL/hr over 120 Minutes Intravenous  Once 09/11/19 1034 09/11/19 1900   09/11/19 1015  piperacillin-tazobactam (ZOSYN) IVPB 4.5 g  Status:  Discontinued     4.5 g 200 mL/hr over 30 Minutes Intravenous Every 8 hours 09/11/19 1010 09/11/19 1052   09/09/19 0845  ceFAZolin (ANCEF) IVPB 2g/100 mL premix     2 g 200 mL/hr over 30 Minutes Intravenous Every 8 hours 09/09/19 0842 09/09/19 1658       Objective   Vitals:   01/18/20 0338 01/18/20 0534 01/18/20 0734 01/18/20 1133  BP: (!) 147/107 (!) 150/94 (!) 129/95 (!) 154/114  Pulse: (!) 116 89 (!) 107 97  Resp: 18  16 16   Temp: 97.7 F (36.5 C)  98.2 F (36.8 C) 98 F (36.7 C)  TempSrc: Oral   Oral  SpO2: 100%  100% 100%  Weight: 57 kg     Height:        Intake/Output Summary (Last 24 hours) at 01/18/2020 1238 Last data filed at 01/17/2020 1246 Gross per 24 hour  Intake 0 ml  Output --  Net 0 ml    No  intake/output data recorded.  Filed Weights   01/16/20 0500 01/17/20 0424 01/18/20 0338  Weight: 53 kg 54.3 kg 57 kg    Physical Examination:    General-appears in no acute distress  Heart-S1-S2, regular, no murmur auscultated  Lungs-clear to auscultation bilaterally, no wheezing or crackles auscultated  Abdomen-soft, nontender, no organomegaly  Extremities-muscle atrophy, contractures in lower extremities  Neuro-alert, aphasia, not following commands    Admission status: Inpatient: Based on patients clinical presentation and evaluation of above clinical data, I have made determination that patient meets Inpatient criteria at this time.   Oswald Hillock   [facility]  If 7PM-7AM, please contact night-coverage at [email or web address], Office  [phone]  password [email or web address]  01/18/2020, 12:38 PM  LOS: 131 days

## 2020-01-18 NOTE — Progress Notes (Signed)
Patient continues to refuse to eat meals delivered to room, tube feedings, and medications.  Patient follows directions such as pulling on side rail when asked to reposition self, so it is likely he understands communication regarding the importance of nutrition and medication when education provided. However, patient will become physically aggressive toward staff and "pinch" feeding tube closed to deny access for tube feedings or medications.  Will continue to attempt this care. Wendee Copp

## 2020-01-19 DIAGNOSIS — N179 Acute kidney failure, unspecified: Secondary | ICD-10-CM | POA: Diagnosis not present

## 2020-01-19 DIAGNOSIS — I10 Essential (primary) hypertension: Secondary | ICD-10-CM | POA: Diagnosis not present

## 2020-01-19 DIAGNOSIS — I619 Nontraumatic intracerebral hemorrhage, unspecified: Secondary | ICD-10-CM | POA: Diagnosis not present

## 2020-01-19 DIAGNOSIS — I612 Nontraumatic intracerebral hemorrhage in hemisphere, unspecified: Secondary | ICD-10-CM | POA: Diagnosis not present

## 2020-01-19 LAB — GLUCOSE, CAPILLARY
Glucose-Capillary: 129 mg/dL — ABNORMAL HIGH (ref 70–99)
Glucose-Capillary: 133 mg/dL — ABNORMAL HIGH (ref 70–99)
Glucose-Capillary: 141 mg/dL — ABNORMAL HIGH (ref 70–99)
Glucose-Capillary: 84 mg/dL (ref 70–99)
Glucose-Capillary: 90 mg/dL (ref 70–99)
Glucose-Capillary: 93 mg/dL (ref 70–99)

## 2020-01-19 NOTE — Progress Notes (Signed)
[facility]  PROGRESS NOTE  Enes Hudzik [id number] DOB: 1981/07/21 DOA: 09/09/2019 PCP: Patient, [name]   Brief HPI:   39 year old male admitted by neurosurgery on 09/09/2019 for seizure, right hemiplegia and decreased responsiveness, found to have left hemispheric intracranial hemorrhage, intubated and admitted to ICU.  Underwent craniotomy for decompression on 09/09/2019 by Dr. Vertell Limber, patient showed slow neurological recovery and prolonged hospitalization due to seizure and aspiration pneumonia.  Patient continued to be awake but significant dysphagia, PEG tube was placed on 09/25/2019.  Placement has been challenging as patient is uninsured and needs long-term care.  Patient vomited tube feeding contents on the night of 01/06/2019.  CT abdomen was negative for obstruction.  Currently patient is tolerating tube feedings.  Awaiting placement.    Subjective   Patient seen and examined, nonverbal.  Has been refusing his medications.   Assessment/Plan:     1. Left ICH-patient underwent decompressive craniectomy and hematoma evacuation on 09/09/2019.  Likely from uncontrolled hypertension.  CT did not show vascular malformation.  Patient continues to be aphasic and nonverbal with right hemiplegia and significant cognitive impairment.  Dr. Vertell Limber did not recommend replacement of skull bone due to lack of progression.  2. Seizure disorder-continue Keppra.  Patient has been refusing to take his medications via PEG tube.  As per nursing staff his holding his PEG tube  and not letting them insert medications.  We will continue to provide medications.  3. Aspiration pneumonia/dysphagia-completed a course of IV antibiotics.  PEG tube in place.  Tolerating tube feeding.  4. Klebsiella UTI-treated with cefazolin.  5. Hypertension-continue manidipine, hydralazine, Lopressor.  6. AKI on chronic kidney disease IIIa-creatinine on admission up to 2.08.  Creatinine decreased to 1.4-1.5.   Currently creatinine is 1.23, at baseline.  7. Vomiting-patient had episode of vomiting contents of tube feeding.  CT abdomen pelvis obtained which was negative for obstruction.  No further vomiting.  Tube feeding was restarted.    SpO2: 100 % O2 Flow Rate (L/min): 0 L/min FiO2 (%): 40 %     Lab Results  Component Value Date   SARSCOV2NAA NEGATIVE 09/09/2019     CBG: Recent Labs  Lab 01/18/20 2000 01/18/20 2319 01/19/20 0344 01/19/20 0755 01/19/20 1142  GLUCAP 128* 127* 90 129* 93    CBC: Recent Labs  Lab 01/15/20 0202 01/17/20 0248  WBC 5.9 7.1  HGB 12.3* 12.8*  HCT 38.4* 39.9  MCV 81.4 81.6  PLT 272 [id number]    Basic Metabolic Panel: Recent Labs  Lab 01/15/20 0202 01/17/20 0248  NA 139 138  K 4.1 4.1  CL 99 100  CO2 29 27  GLUCOSE 123* 77  BUN 27* 31*  CREATININE 1.30* 1.23  CALCIUM 9.9 10.1     Liver Function Tests: No results for input(s): AST, ALT, ALKPHOS, BILITOT, PROT, ALBUMIN in the last 168 hours.      DVT prophylaxis: Lovenox  Code Status: Full code  Family Communication: No family at bedside  Disposition Plan: Awaiting placement     Scheduled medications:  . amLODipine  10 mg Per Tube Daily  . chlorhexidine  15 mL Mouth Rinse BID  . cholecalciferol  1,000 Units Oral Daily  . enoxaparin (LOVENOX) injection  40 mg Subcutaneous Daily  . feeding supplement (OSMOLITE 1.2 CAL)  474 mL Per Tube TID AC & HS  . feeding supplement (PRO-STAT SUGAR FREE 64)  30 mL Per Tube TID  . folic acid  1 mg Per Tube Daily  .  free water  300 mL Per Tube Q4H  . hydrALAZINE  25 mg Per Tube Q8H  . insulin aspart  0-15 Units Subcutaneous Q4H  . levETIRAcetam  500 mg Per Tube BID  . mouth rinse  15 mL Mouth Rinse q12n4p  . multivitamin  15 mL Per Tube Daily  . QUEtiapine  50 mg Per Tube QHS  . thiamine  100 mg Per Tube Daily    Consultants:  Neurosurgery  Procedures:  Decompressive craniectomy and hematoma evacuation, implantation of skull  flap in right abdomen  Antibiotics:   Anti-infectives (From admission, onward)   Start     Dose/Rate Route Frequency Ordered Stop   09/27/19 1130  cephALEXin (KEFLEX) 250 MG/5ML suspension 500 mg     500 mg Per Tube Every 12 hours 09/27/19 1052 09/29/19 2250   09/25/19 1445  ceFAZolin (ANCEF) IVPB 2g/100 mL premix  Status:  Discontinued     2 g 200 mL/hr over 30 Minutes Intravenous Every 12 hours 09/25/19 1417 09/27/19 1052   09/14/19 1000  Ampicillin-Sulbactam (UNASYN) 3 g in sodium chloride 0.9 % 100 mL IVPB     3 g 200 mL/hr over 30 Minutes Intravenous Every 8 hours 09/14/19 0922 09/17/19 1816   09/13/19 1100  vancomycin (VANCOCIN) IVPB 750 mg/150 ml premix  Status:  Discontinued     750 mg 150 mL/hr over 60 Minutes Intravenous Every 24 hours 09/13/19 0728 09/13/19 0838   09/12/19 1100  vancomycin (VANCOCIN) IVPB 1000 mg/200 mL premix  Status:  Discontinued     1,000 mg 200 mL/hr over 60 Minutes Intravenous Every 24 hours 09/11/19 1034 09/13/19 0728   09/11/19 1130  piperacillin-tazobactam (ZOSYN) IVPB 3.375 g  Status:  Discontinued     3.375 g 12.5 mL/hr over 240 Minutes Intravenous Every 8 hours 09/11/19 1054 09/14/19 0845   09/11/19 1100  vancomycin (VANCOCIN) 1,500 mg in sodium chloride 0.9 % 500 mL IVPB     1,500 mg 250 mL/hr over 120 Minutes Intravenous  Once 09/11/19 1034 09/11/19 1900   09/11/19 1015  piperacillin-tazobactam (ZOSYN) IVPB 4.5 g  Status:  Discontinued     4.5 g 200 mL/hr over 30 Minutes Intravenous Every 8 hours 09/11/19 1010 09/11/19 1052   09/09/19 0845  ceFAZolin (ANCEF) IVPB 2g/100 mL premix     2 g 200 mL/hr over 30 Minutes Intravenous Every 8 hours 09/09/19 0842 09/09/19 1658       Objective   Vitals:   01/19/20 0753 01/19/20 0800 01/19/20 1056 01/19/20 1140  BP: (!) 125/94  (!) 125/102 (!) 132/97  Pulse: (!) 145 (!) 102  (!) 106  Resp: 16   17  Temp: 98.6 F (37 C)   99.1 F (37.3 C)  TempSrc: Oral   Oral  SpO2: 100%   100%  Weight:       Height:       No intake or output data in the 24 hours ending 01/19/20 1231  No intake/output data recorded.  Filed Weights   01/16/20 0500 01/17/20 0424 01/18/20 0338  Weight: 53 kg 54.3 kg 57 kg    Physical Examination:  General-appears in no acute distress Heart-S1-S2, regular, no murmur auscultated Lungs-clear to auscultation bilaterally, no wheezing or crackles auscultated Abdomen-soft, nontender, no organomegaly Extremities-no edema in the lower extremities Neuro-alert, aphasic, not following commands.     Admission status: Inpatient: Based on patients clinical presentation and evaluation of above clinical data, I have made determination that patient meets Inpatient criteria at this  time.   Oswald Hillock   [facility] If 7PM-7AM, please contact night-coverage at [email or web address], Office  [phone]  password [facility]  01/19/2020, 12:31 PM  LOS: 132 days

## 2020-01-19 NOTE — Progress Notes (Signed)
Spoke with patients Mother, updated about medication refusals.   Joshua [name]

## 2020-01-19 NOTE — TOC Progression Note (Signed)
Transition of Care [facility]) - Progression Note    Patient Details  Name: Joshua [name] MRN: [id number] Date of Birth: [birth year]  Transition of Care [facility]) CM/SW [place], [place] Phone Number: 01/19/2020, 4:08 PM  Clinical Narrative:    Continues to have disability pending and no bed offers for SNF rehab.  TOC following.   Expected Discharge Plan: Skilled Nursing Facility Barriers to Discharge: SNF Pending payor source - LOG, SNF Pending bed offer, Inadequate or no insurance  Expected Discharge Plan and Services Expected Discharge Plan: [place] In-house Referral: Clinical Social Work Discharge Planning Services: NA Post Acute Care Choice: [place] Living arrangements for the past 2 months: Apartment             DME Arranged: N/A DME Agency: NA HH Arranged: NA [place] Agency: NA  Readmission Risk Interventions No flowsheet data found.

## 2020-01-19 NOTE — Progress Notes (Addendum)
Pt refused all AM meds, re educated by 2 RNs, refused for all Stated no and stop and that he did not want. Swatting staff away from him. Clamped peg tube shut, so meds could not be given.   MD notified.   Arlis Porta

## 2020-01-20 DIAGNOSIS — G936 Cerebral edema: Secondary | ICD-10-CM | POA: Diagnosis not present

## 2020-01-20 DIAGNOSIS — Z4659 Encounter for fitting and adjustment of other gastrointestinal appliance and device: Secondary | ICD-10-CM | POA: Diagnosis not present

## 2020-01-20 DIAGNOSIS — N179 Acute kidney failure, unspecified: Secondary | ICD-10-CM | POA: Diagnosis not present

## 2020-01-20 DIAGNOSIS — J9601 Acute respiratory failure with hypoxia: Secondary | ICD-10-CM | POA: Diagnosis not present

## 2020-01-20 LAB — GLUCOSE, CAPILLARY
Glucose-Capillary: 110 mg/dL — ABNORMAL HIGH (ref 70–99)
Glucose-Capillary: 156 mg/dL — ABNORMAL HIGH (ref 70–99)
Glucose-Capillary: 156 mg/dL — ABNORMAL HIGH (ref 70–99)
Glucose-Capillary: 166 mg/dL — ABNORMAL HIGH (ref 70–99)
Glucose-Capillary: 71 mg/dL (ref 70–99)
Glucose-Capillary: 91 mg/dL (ref 70–99)

## 2020-01-20 NOTE — Progress Notes (Signed)
[facility]  PROGRESS NOTE  Joshua [name] [id number] DOB: 01/24/1981 DOA: 09/09/2019 PCP: Patient, [name]   Brief HPI:   39 year old male admitted by neurosurgery on 09/09/2019 for seizure, right hemiplegia and decreased responsiveness, found to have left hemispheric intracranial hemorrhage, intubated and admitted to ICU.  Underwent craniotomy for decompression on 09/09/2019 by Dr. Vertell Limber, patient showed slow neurological recovery and prolonged hospitalization due to seizure and aspiration pneumonia.  Patient continued to be awake but significant dysphagia, PEG tube was placed on 09/25/2019.  Placement has been challenging as patient is uninsured and needs long-term care.  Patient vomited tube feeding contents on the night of 01/06/2019.  CT abdomen was negative for obstruction.  Currently patient is tolerating tube feedings.  Awaiting placement.    Subjective   Patient seen and examined, has been refusing his medications yesterday. It took medications from RN this morning. Refused to be examined by me.   Assessment/Plan:     1. Left ICH-patient underwent decompressive craniectomy and hematoma evacuation on 09/09/2019.  Likely from uncontrolled hypertension.  CT did not show vascular malformation.  Patient continues to be aphasic and nonverbal with right hemiplegia and significant cognitive impairment.  Dr. Vertell Limber did not recommend replacement of skull bone due to lack of progression.  2. Seizure disorder-continue Keppra.  Patient has been refusing to take his medications via PEG tube.  As per nursing staff his holding his PEG tube  and not letting them insert medications.  We will continue to provide medications.  3. Aspiration pneumonia/dysphagia-completed a course of IV antibiotics.  PEG tube in place.  Tolerating tube feeding.  4. Klebsiella UTI-treated with cefazolin.  5. Hypertension-continue manidipine, hydralazine, Lopressor.  6. AKI on chronic kidney disease  IIIa-creatinine on admission up to 2.08.  Creatinine decreased to 1.4-1.5.  Currently creatinine is 1.23, at baseline.  7. Vomiting-patient had episode of vomiting contents of tube feeding.  CT abdomen pelvis obtained which was negative for obstruction.  No further vomiting.  Tube feeding was restarted.    SpO2: 100 % O2 Flow Rate (L/min): 0 L/min FiO2 (%): 40 %     Lab Results  Component Value Date   SARSCOV2NAA NEGATIVE 09/09/2019     CBG: Recent Labs  Lab 01/19/20 1540 01/19/20 2022 01/19/20 2338 01/20/20 0411 01/20/20 0812  GLUCAP 84 141* 133* 91 166*    CBC: Recent Labs  Lab 01/15/20 0202 01/17/20 0248  WBC 5.9 7.1  HGB 12.3* 12.8*  HCT 38.4* 39.9  MCV 81.4 81.6  PLT 272 [id number]    Basic Metabolic Panel: Recent Labs  Lab 01/15/20 0202 01/17/20 0248  NA 139 138  K 4.1 4.1  CL 99 100  CO2 29 27  GLUCOSE 123* 77  BUN 27* 31*  CREATININE 1.30* 1.23  CALCIUM 9.9 10.1     Liver Function Tests: No results for input(s): AST, ALT, ALKPHOS, BILITOT, PROT, ALBUMIN in the last 168 hours.      DVT prophylaxis: Lovenox  Code Status: Full code  Family Communication: No family at bedside  Disposition Plan: Awaiting placement     Scheduled medications:  . amLODipine  10 mg Per Tube Daily  . chlorhexidine  15 mL Mouth Rinse BID  . cholecalciferol  1,000 Units Oral Daily  . enoxaparin (LOVENOX) injection  40 mg Subcutaneous Daily  . feeding supplement (OSMOLITE 1.2 CAL)  474 mL Per Tube TID AC & HS  . feeding supplement (PRO-STAT SUGAR FREE 64)  30 mL Per Tube TID  .  folic acid  1 mg Per Tube Daily  . free water  300 mL Per Tube Q4H  . hydrALAZINE  25 mg Per Tube Q8H  . insulin aspart  0-15 Units Subcutaneous Q4H  . levETIRAcetam  500 mg Per Tube BID  . mouth rinse  15 mL Mouth Rinse q12n4p  . multivitamin  15 mL Per Tube Daily  . QUEtiapine  50 mg Per Tube QHS  . thiamine  100 mg Per Tube Daily     Consultants:  Neurosurgery  Procedures:  Decompressive craniectomy and hematoma evacuation, implantation of skull flap in right abdomen  Antibiotics:   Anti-infectives (From admission, onward)   Start     Dose/Rate Route Frequency Ordered Stop   09/27/19 1130  cephALEXin (KEFLEX) 250 MG/5ML suspension 500 mg     500 mg Per Tube Every 12 hours 09/27/19 1052 09/29/19 2250   09/25/19 1445  ceFAZolin (ANCEF) IVPB 2g/100 mL premix  Status:  Discontinued     2 g 200 mL/hr over 30 Minutes Intravenous Every 12 hours 09/25/19 1417 09/27/19 1052   09/14/19 1000  Ampicillin-Sulbactam (UNASYN) 3 g in sodium chloride 0.9 % 100 mL IVPB     3 g 200 mL/hr over 30 Minutes Intravenous Every 8 hours 09/14/19 0922 09/17/19 1816   09/13/19 1100  vancomycin (VANCOCIN) IVPB 750 mg/150 ml premix  Status:  Discontinued     750 mg 150 mL/hr over 60 Minutes Intravenous Every 24 hours 09/13/19 0728 09/13/19 0838   09/12/19 1100  vancomycin (VANCOCIN) IVPB 1000 mg/200 mL premix  Status:  Discontinued     1,000 mg 200 mL/hr over 60 Minutes Intravenous Every 24 hours 09/11/19 1034 09/13/19 0728   09/11/19 1130  piperacillin-tazobactam (ZOSYN) IVPB 3.375 g  Status:  Discontinued     3.375 g 12.5 mL/hr over 240 Minutes Intravenous Every 8 hours 09/11/19 1054 09/14/19 0845   09/11/19 1100  vancomycin (VANCOCIN) 1,500 mg in sodium chloride 0.9 % 500 mL IVPB     1,500 mg 250 mL/hr over 120 Minutes Intravenous  Once 09/11/19 1034 09/11/19 1900   09/11/19 1015  piperacillin-tazobactam (ZOSYN) IVPB 4.5 g  Status:  Discontinued     4.5 g 200 mL/hr over 30 Minutes Intravenous Every 8 hours 09/11/19 1010 09/11/19 1052   09/09/19 0845  ceFAZolin (ANCEF) IVPB 2g/100 mL premix     2 g 200 mL/hr over 30 Minutes Intravenous Every 8 hours 09/09/19 0842 09/09/19 1658       Objective   Vitals:   01/19/20 2025 01/19/20 2340 01/20/20 0413 01/20/20 0858  BP: (!) 144/104 (!) 135/101 (!) 132/107 (!) 143/98  Pulse: 100  (!) 112 (!) 105 (!) 105  Resp: 17 18 17    Temp: 98.2 F (36.8 C) 98.8 F (37.1 C) 98.4 F (36.9 C)   TempSrc: Oral Oral Oral   SpO2: 100% 100% 99% 100%  Weight:      Height:       No intake or output data in the 24 hours ending 01/20/20 1046  No intake/output data recorded.  Filed Weights   01/16/20 0500 01/17/20 0424 01/18/20 0338  Weight: 53 kg 54.3 kg 57 kg    Physical Examination:  Appears comfortable  Rest of physical exam not performed as patient refused.     Admission status: Inpatient: Based on patients clinical presentation and evaluation of above clinical data, I have made determination that patient meets Inpatient criteria at this time.   Oswald Hillock  [facility] If 7PM-7AM, please contact night-coverage at [email or web address], Office  [phone]  password [email or web address]  01/20/2020, 10:46 AM  LOS: 133 days

## 2020-01-21 DIAGNOSIS — Z4659 Encounter for fitting and adjustment of other gastrointestinal appliance and device: Secondary | ICD-10-CM | POA: Diagnosis not present

## 2020-01-21 DIAGNOSIS — G936 Cerebral edema: Secondary | ICD-10-CM | POA: Diagnosis not present

## 2020-01-21 DIAGNOSIS — J9601 Acute respiratory failure with hypoxia: Secondary | ICD-10-CM | POA: Diagnosis not present

## 2020-01-21 DIAGNOSIS — N179 Acute kidney failure, unspecified: Secondary | ICD-10-CM | POA: Diagnosis not present

## 2020-01-21 LAB — GLUCOSE, CAPILLARY
Glucose-Capillary: 66 mg/dL — ABNORMAL LOW (ref 70–99)
Glucose-Capillary: 111 mg/dL — ABNORMAL HIGH (ref 70–99)
Glucose-Capillary: 141 mg/dL — ABNORMAL HIGH (ref 70–99)
Glucose-Capillary: 88 mg/dL (ref 70–99)
Glucose-Capillary: 148 mg/dL — ABNORMAL HIGH (ref 70–99)
Glucose-Capillary: 91 mg/dL (ref 70–99)

## 2020-01-21 NOTE — Progress Notes (Signed)
Pt CBG 66. Administered 8oz grape juice per tube; will re-check CBG and continue to monitor pt.

## 2020-01-21 NOTE — Progress Notes (Signed)
[facility]  PROGRESS NOTE  Joshua [name] [id number] DOB: 11/18/1981 DOA: 09/09/2019 PCP: Patient, [name]   Brief HPI:   39 year old male admitted by neurosurgery on 09/09/2019 for seizure, right hemiplegia and decreased responsiveness, found to have left hemispheric intracranial hemorrhage, intubated and admitted to ICU.  Underwent craniotomy for decompression on 09/09/2019 by Dr. Vertell Limber, patient showed slow neurological recovery and prolonged hospitalization due to seizure and aspiration pneumonia.  Patient continued to be awake but significant dysphagia, PEG tube was placed on 09/25/2019.  Placement has been challenging as patient is uninsured and needs long-term care.  Patient vomited tube feeding contents on the night of 01/06/2019.  CT abdomen was negative for obstruction.  Currently patient is tolerating tube feedings.  Awaiting placement.    Subjective   Patient seen and examined, continues to be nonverbal.   Assessment/Plan:     1. Left ICH-patient underwent decompressive craniectomy and hematoma evacuation on 09/09/2019.  Likely from uncontrolled hypertension.  CT did not show vascular malformation.  Patient continues to be aphasic and nonverbal with right hemiplegia and significant cognitive impairment.  Dr. Vertell Limber did not recommend replacement of skull bone due to lack of progression.  2. Seizure disorder-continue Keppra.  Patient has been refusing to take his medications via PEG tube.  As per nursing staff his holding his PEG tube  and not letting them insert medications.  We will continue to provide medications.  3. Aspiration pneumonia/dysphagia-completed a course of IV antibiotics.  PEG tube in place.  Tolerating tube feeding.  4. Klebsiella UTI-treated with cefazolin.  5. Hypertension-continue manidipine, hydralazine, Lopressor.  6. AKI on chronic kidney disease IIIa-creatinine on admission up to 2.08.  Creatinine decreased to 1.4-1.5.  Currently creatinine is  1.23, at baseline.  7. Vomiting-patient had episode of vomiting contents of tube feeding.  CT abdomen pelvis obtained which was negative for obstruction.  No further vomiting.  Tube feeding was restarted.    SpO2: 100 % O2 Flow Rate (L/min): 0 L/min FiO2 (%): 40 %     Lab Results  Component Value Date   SARSCOV2NAA NEGATIVE 09/09/2019     CBG: Recent Labs  Lab 01/20/20 2023 01/20/20 2355 01/21/20 0353 01/21/20 0451 01/21/20 0837  GLUCAP 71 156* 66* 111* 141*    CBC: Recent Labs  Lab 01/15/20 0202 01/17/20 0248  WBC 5.9 7.1  HGB 12.3* 12.8*  HCT 38.4* 39.9  MCV 81.4 81.6  PLT 272 [id number]    Basic Metabolic Panel: Recent Labs  Lab 01/15/20 0202 01/17/20 0248  NA 139 138  K 4.1 4.1  CL 99 100  CO2 29 27  GLUCOSE 123* 77  BUN 27* 31*  CREATININE 1.30* 1.23  CALCIUM 9.9 10.1     Liver Function Tests: No results for input(s): AST, ALT, ALKPHOS, BILITOT, PROT, ALBUMIN in the last 168 hours.      DVT prophylaxis: Lovenox  Code Status: Full code  Family Communication: No family at bedside  Disposition Plan: Awaiting placement     Scheduled medications:  . amLODipine  10 mg Per Tube Daily  . chlorhexidine  15 mL Mouth Rinse BID  . cholecalciferol  1,000 Units Oral Daily  . enoxaparin (LOVENOX) injection  40 mg Subcutaneous Daily  . feeding supplement (OSMOLITE 1.2 CAL)  474 mL Per Tube TID AC & HS  . feeding supplement (PRO-STAT SUGAR FREE 64)  30 mL Per Tube TID  . folic acid  1 mg Per Tube Daily  . free water  300 mL Per Tube Q4H  . hydrALAZINE  25 mg Per Tube Q8H  . insulin aspart  0-15 Units Subcutaneous Q4H  . levETIRAcetam  500 mg Per Tube BID  . mouth rinse  15 mL Mouth Rinse q12n4p  . multivitamin  15 mL Per Tube Daily  . QUEtiapine  50 mg Per Tube QHS  . thiamine  100 mg Per Tube Daily    Consultants:  Neurosurgery  Procedures:  Decompressive craniectomy and hematoma evacuation, implantation of skull flap in right  abdomen  Antibiotics:   Anti-infectives (From admission, onward)   Start     Dose/Rate Route Frequency Ordered Stop   09/27/19 1130  cephALEXin (KEFLEX) 250 MG/5ML suspension 500 mg     500 mg Per Tube Every 12 hours 09/27/19 1052 09/29/19 2250   09/25/19 1445  ceFAZolin (ANCEF) IVPB 2g/100 mL premix  Status:  Discontinued     2 g 200 mL/hr over 30 Minutes Intravenous Every 12 hours 09/25/19 1417 09/27/19 1052   09/14/19 1000  Ampicillin-Sulbactam (UNASYN) 3 g in sodium chloride 0.9 % 100 mL IVPB     3 g 200 mL/hr over 30 Minutes Intravenous Every 8 hours 09/14/19 0922 09/17/19 1816   09/13/19 1100  vancomycin (VANCOCIN) IVPB 750 mg/150 ml premix  Status:  Discontinued     750 mg 150 mL/hr over 60 Minutes Intravenous Every 24 hours 09/13/19 0728 09/13/19 0838   09/12/19 1100  vancomycin (VANCOCIN) IVPB 1000 mg/200 mL premix  Status:  Discontinued     1,000 mg 200 mL/hr over 60 Minutes Intravenous Every 24 hours 09/11/19 1034 09/13/19 0728   09/11/19 1130  piperacillin-tazobactam (ZOSYN) IVPB 3.375 g  Status:  Discontinued     3.375 g 12.5 mL/hr over 240 Minutes Intravenous Every 8 hours 09/11/19 1054 09/14/19 0845   09/11/19 1100  vancomycin (VANCOCIN) 1,500 mg in sodium chloride 0.9 % 500 mL IVPB     1,500 mg 250 mL/hr over 120 Minutes Intravenous  Once 09/11/19 1034 09/11/19 1900   09/11/19 1015  piperacillin-tazobactam (ZOSYN) IVPB 4.5 g  Status:  Discontinued     4.5 g 200 mL/hr over 30 Minutes Intravenous Every 8 hours 09/11/19 1010 09/11/19 1052   09/09/19 0845  ceFAZolin (ANCEF) IVPB 2g/100 mL premix     2 g 200 mL/hr over 30 Minutes Intravenous Every 8 hours 09/09/19 0842 09/09/19 1658       Objective   Vitals:   01/20/20 2358 01/21/20 0352 01/21/20 0500 01/21/20 0700  BP: (!) 119/96 (!) 149/111  (!) 128/101  Pulse: (!) 109 95  (!) 108  Resp: 17   18  Temp: 98.3 F (36.8 C)   98.3 F (36.8 C)  TempSrc: Oral   Oral  SpO2: 99%   100%  Weight:   55 kg   Height:        No intake or output data in the 24 hours ending 01/21/20 1100  No intake/output data recorded.  Filed Weights   01/17/20 0424 01/18/20 0338 01/21/20 0500  Weight: 54.3 kg 57 kg 55 kg    Physical Examination:  General-appears in no acute distress Heart-S1-S2, regular, no murmur auscultated Lungs-clear to auscultation bilaterally, no wheezing or crackles auscultated Abdomen-soft, nontender, no organomegaly Extremities-no edema in the lower extremities Neuro-alert, nonverbal, contractures in lower extremities     Admission status: Inpatient: Based on patients clinical presentation and evaluation of above clinical data, I have made determination that patient meets Inpatient criteria at this time.  Oswald Hillock   [facility] If 7PM-7AM, please contact night-coverage at [email or web address], Office  [phone]  password [email or web address]  01/21/2020, 11:00 AM  LOS: 134 days

## 2020-01-21 NOTE — Progress Notes (Signed)
On re-check, pt CBG is now 111. Will continue to monitor.

## 2020-01-22 DIAGNOSIS — N179 Acute kidney failure, unspecified: Secondary | ICD-10-CM | POA: Diagnosis not present

## 2020-01-22 DIAGNOSIS — J9601 Acute respiratory failure with hypoxia: Secondary | ICD-10-CM | POA: Diagnosis not present

## 2020-01-22 DIAGNOSIS — Z4659 Encounter for fitting and adjustment of other gastrointestinal appliance and device: Secondary | ICD-10-CM | POA: Diagnosis not present

## 2020-01-22 DIAGNOSIS — G936 Cerebral edema: Secondary | ICD-10-CM | POA: Diagnosis not present

## 2020-01-22 LAB — GLUCOSE, CAPILLARY
Glucose-Capillary: 122 mg/dL — ABNORMAL HIGH (ref 70–99)
Glucose-Capillary: 149 mg/dL — ABNORMAL HIGH (ref 70–99)
Glucose-Capillary: 95 mg/dL (ref 70–99)
Glucose-Capillary: 144 mg/dL — ABNORMAL HIGH (ref 70–99)
Glucose-Capillary: 84 mg/dL (ref 70–99)
Glucose-Capillary: 79 mg/dL (ref 70–99)

## 2020-01-22 NOTE — Progress Notes (Signed)
[facility]  PROGRESS NOTE  Joshua [name] [id number] DOB: Jul 19, 1981 DOA: 09/09/2019 PCP: Joshua [name], [name]   Brief HPI:   39 year old male admitted by neurosurgery on 09/09/2019 for seizure, right hemiplegia and decreased responsiveness, found to have left hemispheric intracranial hemorrhage, intubated and admitted to ICU.  Underwent craniotomy for decompression on 09/09/2019 by Dr. Vertell Limber, Joshua [name] showed slow neurological recovery and prolonged hospitalization due to seizure and aspiration pneumonia.  Joshua [name] continued to be awake but significant dysphagia, PEG tube was placed on 09/25/2019.  Placement has been challenging as Joshua [name] is uninsured and needs long-term care.  Joshua [name] vomited tube feeding contents on the night of 01/06/2019.  CT abdomen was negative for obstruction.  Currently Joshua [name] is tolerating tube feedings.  Awaiting placement.    Subjective   Joshua [name] seen and examined, no new complaints.  Nonverbal.   Assessment/Plan:     1. Left ICH-Joshua [name] underwent decompressive craniectomy and hematoma evacuation on 09/09/2019.  Likely from uncontrolled hypertension.  CT did not show vascular malformation.  Joshua [name] continues to be aphasic and nonverbal with right hemiplegia and significant cognitive impairment.  Dr. Vertell Limber did not recommend replacement of skull bone due to lack of progression.  2. Seizure disorder-continue Keppra.  Joshua [name] has been refusing to take his medications via PEG tube.  As per nursing staff his holding his PEG tube  and not letting them insert medications.  We will continue to provide medications.  3. Aspiration pneumonia/dysphagia-completed a course of IV antibiotics.  PEG tube in place.  Tolerating tube feeding.  4. Klebsiella UTI-treated with cefazolin.  5. Hypertension-continue manidipine, hydralazine, Lopressor.  6. AKI on chronic kidney disease IIIa-creatinine on admission up to 2.08.  Creatinine decreased to 1.4-1.5.  Currently creatinine is  1.23, at baseline.  7. Vomiting-Joshua [name] had episode of vomiting contents of tube feeding.  CT abdomen pelvis obtained which was negative for obstruction.  No further vomiting.  Tube feeding was restarted.    SpO2: 99 % O2 Flow Rate (L/min): 0 L/min FiO2 (%): 40 %     Lab Results  Component Value Date   SARSCOV2NAA NEGATIVE 09/09/2019     CBG: Recent Labs  Lab 01/21/20 1613 01/21/20 2035 01/21/20 2350 01/22/20 0415 01/22/20 0830  GLUCAP 148* 91 122* 95 149*    CBC: Recent Labs  Lab 01/17/20 0248  WBC 7.1  HGB 12.8*  HCT 39.9  MCV 81.6  PLT [id number]    Basic Metabolic Panel: Recent Labs  Lab 01/17/20 0248  NA 138  K 4.1  CL 100  CO2 27  GLUCOSE 77  BUN 31*  CREATININE 1.23  CALCIUM 10.1     Liver Function Tests: No results for input(s): AST, ALT, ALKPHOS, BILITOT, PROT, ALBUMIN in the last 168 hours.      DVT prophylaxis: Lovenox  Code Status: Full code  Family Communication: No family at bedside  Disposition Plan: Awaiting placement     Scheduled medications:  . amLODipine  10 mg Per Tube Daily  . chlorhexidine  15 mL Mouth Rinse BID  . cholecalciferol  1,000 Units Oral Daily  . enoxaparin (LOVENOX) injection  40 mg Subcutaneous Daily  . feeding supplement (OSMOLITE 1.2 CAL)  474 mL Per Tube TID AC & HS  . feeding supplement (PRO-STAT SUGAR FREE 64)  30 mL Per Tube TID  . folic acid  1 mg Per Tube Daily  . free water  300 mL Per Tube Q4H  . hydrALAZINE  25 mg Per Tube Q8H  .  insulin aspart  0-15 Units Subcutaneous Q4H  . levETIRAcetam  500 mg Per Tube BID  . mouth rinse  15 mL Mouth Rinse q12n4p  . multivitamin  15 mL Per Tube Daily  . QUEtiapine  50 mg Per Tube QHS  . thiamine  100 mg Per Tube Daily    Consultants:  Neurosurgery  Procedures:  Decompressive craniectomy and hematoma evacuation, implantation of skull flap in right abdomen  Antibiotics:   Anti-infectives (From admission, onward)   Start     Dose/Rate Route  Frequency Ordered Stop   09/27/19 1130  cephALEXin (KEFLEX) 250 MG/5ML suspension 500 mg     500 mg Per Tube Every 12 hours 09/27/19 1052 09/29/19 2250   09/25/19 1445  ceFAZolin (ANCEF) IVPB 2g/100 mL premix  Status:  Discontinued     2 g 200 mL/hr over 30 Minutes Intravenous Every 12 hours 09/25/19 1417 09/27/19 1052   09/14/19 1000  Ampicillin-Sulbactam (UNASYN) 3 g in sodium chloride 0.9 % 100 mL IVPB     3 g 200 mL/hr over 30 Minutes Intravenous Every 8 hours 09/14/19 0922 09/17/19 1816   09/13/19 1100  vancomycin (VANCOCIN) IVPB 750 mg/150 ml premix  Status:  Discontinued     750 mg 150 mL/hr over 60 Minutes Intravenous Every 24 hours 09/13/19 0728 09/13/19 0838   09/12/19 1100  vancomycin (VANCOCIN) IVPB 1000 mg/200 mL premix  Status:  Discontinued     1,000 mg 200 mL/hr over 60 Minutes Intravenous Every 24 hours 09/11/19 1034 09/13/19 0728   09/11/19 1130  piperacillin-tazobactam (ZOSYN) IVPB 3.375 g  Status:  Discontinued     3.375 g 12.5 mL/hr over 240 Minutes Intravenous Every 8 hours 09/11/19 1054 09/14/19 0845   09/11/19 1100  vancomycin (VANCOCIN) 1,500 mg in sodium chloride 0.9 % 500 mL IVPB     1,500 mg 250 mL/hr over 120 Minutes Intravenous  Once 09/11/19 1034 09/11/19 1900   09/11/19 1015  piperacillin-tazobactam (ZOSYN) IVPB 4.5 g  Status:  Discontinued     4.5 g 200 mL/hr over 30 Minutes Intravenous Every 8 hours 09/11/19 1010 09/11/19 1052   09/09/19 0845  ceFAZolin (ANCEF) IVPB 2g/100 mL premix     2 g 200 mL/hr over 30 Minutes Intravenous Every 8 hours 09/09/19 0842 09/09/19 1658       Objective   Vitals:   01/21/20 2348 01/22/20 0415 01/22/20 0500 01/22/20 0838  BP: (!) 155/105 (!) 152/113  (!) 137/98  Pulse: (!) 122 93  (!) 111  Resp: 18 18  18   Temp: 98.8 F (37.1 C) 97.8 F (36.6 C)  98.8 F (37.1 C)  TempSrc: Oral Oral  Oral  SpO2: 100% 100%  99%  Weight:   55 kg   Height:        Intake/Output Summary (Last 24 hours) at 01/22/2020 1115 Last  data filed at 01/21/2020 1530 Gross per 24 hour  Intake 20700 ml  Output --  Net 20700 ml    01/23 1901 - 01/25 0700 In: 20700  Out: -   Filed Weights   01/18/20 0338 01/21/20 0500 01/22/20 0500  Weight: 57 kg 55 kg 55 kg    Physical Examination:  General-appears in no acute distress Did not let me examine him.    Admission status: Inpatient: Based on patients clinical presentation and evaluation of above clinical data, I have made determination that Joshua [name] meets Inpatient criteria at this time.   Oswald Hillock   [facility] If 7PM-7AM, please  contact night-coverage at [email or web address], Office  [phone]  password [email or web address]  01/22/2020, 11:15 AM  LOS: 135 days

## 2020-01-23 DIAGNOSIS — I10 Essential (primary) hypertension: Secondary | ICD-10-CM | POA: Diagnosis not present

## 2020-01-23 DIAGNOSIS — J9601 Acute respiratory failure with hypoxia: Secondary | ICD-10-CM | POA: Diagnosis not present

## 2020-01-23 DIAGNOSIS — Z4659 Encounter for fitting and adjustment of other gastrointestinal appliance and device: Secondary | ICD-10-CM | POA: Diagnosis not present

## 2020-01-23 DIAGNOSIS — I611 Nontraumatic intracerebral hemorrhage in hemisphere, cortical: Secondary | ICD-10-CM | POA: Diagnosis not present

## 2020-01-23 LAB — GLUCOSE, CAPILLARY
Glucose-Capillary: 111 mg/dL — ABNORMAL HIGH (ref 70–99)
Glucose-Capillary: 136 mg/dL — ABNORMAL HIGH (ref 70–99)
Glucose-Capillary: 211 mg/dL — ABNORMAL HIGH (ref 70–99)
Glucose-Capillary: 49 mg/dL — ABNORMAL LOW (ref 70–99)
Glucose-Capillary: 67 mg/dL — ABNORMAL LOW (ref 70–99)
Glucose-Capillary: 84 mg/dL (ref 70–99)
Glucose-Capillary: 96 mg/dL (ref 70–99)
Glucose-Capillary: 96 mg/dL (ref 70–99)

## 2020-01-23 MED ORDER — DEXTROSE 50 % IV SOLN: 25.0000 g | Freq: Once | INTRAVENOUS | Status: DC

## 2020-01-23 MED ORDER — DEXTROSE 50 % IV SOLN
INTRAVENOUS | Status: AC
  Filled 2020-01-23: qty 50

## 2020-01-23 MED ORDER — GLUCOSE 40 % PO GEL
1.0000 | Freq: Once | ORAL | Status: DC
  Filled 2020-01-23: qty 1

## 2020-01-23 NOTE — Progress Notes (Signed)
Pt resists care , Maxi assist before TF was given . Pt continue to refused mouth care

## 2020-01-23 NOTE — Progress Notes (Signed)
[facility]  PROGRESS NOTE  Joshua [name] [id number] DOB: 04/23/1981 DOA: 09/09/2019 PCP: Patient, [name]   Brief HPI:    39 year old male admitted by neurosurgery on 09/09/2019 for seizure, right hemiplegia and decreased responsiveness, found to have left hemispheric intracranial hemorrhage, intubated and admitted to ICU.  Underwent craniotomy for decompression on 09/09/2019 by Dr. Vertell Limber, patient showed slow neurological recovery and prolonged hospitalization due to seizure and aspiration pneumonia.  Patient continued to be awake but significant dysphagia, PEG tube was placed on 09/25/2019.  Placement has been challenging as patient is uninsured and needs long-term care.  Patient vomited tube feeding contents on the night of 01/06/2019.  CT abdomen was negative for obstruction.  Currently patient is tolerating tube feedings.  Awaiting placement.   Subjective   Patient seen and examined, nonverbal. Refuse to be examined.   Assessment/Plan:    1. Left ICH- patient underwent decompressive craniectomy and hematoma evacuation on 09/09/2019.  Likely from uncontrolled hypertension.  CT did not show vascular malformation.  Patient continues to be aphasic and nonverbal with right hemiplegia and significant cognitive impairment.  Dr. Vertell Limber did not recommend replacement of skull bone due to lack of progression.  2. Seizure disorder- continue Keppra. Patient has been refusing to take his medications via PEG tube.  As per nursing staff his holding his PEG tube  and not letting them insert medications.  We will continue to provide medications.  3. Aspiration pneumonia/dysphagia- completed a course of IV antibiotics.  PEG tube in place.  Tolerating tube feeding.  4. Klebsiella UTI- treated with cefazolin.  5. Hypertension- continue amlodipine, hydralazine.  6. AKI on chronic kidney disease IIIa- creatinine on admission up to 2.08.  Creatinine decreased to 1.4-1.5.  Currently creatinine is  1.23, at baseline.  7. Vomiting- patient had episode of vomiting contents of tube feeding. CT abdomen pelvis obtained which was negative for obstruction. No further vomiting. Tube feeding was restarted.    SpO2: 100 % O2 Flow Rate (L/min): 0 L/min FiO2 (%): 40 %     Lab Results  Component Value Date   SARSCOV2NAA NEGATIVE 09/09/2019     CBG: Recent Labs  Lab 01/22/20 1545 01/22/20 1947 01/23/20 0007 01/23/20 0359 01/23/20 0736  GLUCAP 84 79 136* 84 96    CBC: Recent Labs  Lab 01/17/20 0248  WBC 7.1  HGB 12.8*  HCT 39.9  MCV 81.6  PLT [id number]    Basic Metabolic Panel: Recent Labs  Lab 01/17/20 0248  NA 138  K 4.1  CL 100  CO2 27  GLUCOSE 77  BUN 31*  CREATININE 1.23  CALCIUM 10.1      DVT prophylaxis: Lovenox  Code Status: Full code  Family Communication: No family at bedside  Disposition Plan: Awaiting placement ar SNF     Scheduled medications:  . amLODipine  10 mg Per Tube Daily  . chlorhexidine  15 mL Mouth Rinse BID  . cholecalciferol  1,000 Units Oral Daily  . enoxaparin (LOVENOX) injection  40 mg Subcutaneous Daily  . feeding supplement (OSMOLITE 1.2 CAL)  474 mL Per Tube TID AC & HS  . feeding supplement (PRO-STAT SUGAR FREE 64)  30 mL Per Tube TID  . folic acid  1 mg Per Tube Daily  . free water  300 mL Per Tube Q4H  . hydrALAZINE  25 mg Per Tube Q8H  . insulin aspart  0-15 Units Subcutaneous Q4H  . levETIRAcetam  500 mg Per Tube BID  . mouth  rinse  15 mL Mouth Rinse q12n4p  . multivitamin  15 mL Per Tube Daily  . QUEtiapine  50 mg Per Tube QHS  . thiamine  100 mg Per Tube Daily    Consultants:  Neurosurgery  Procedures:  Decompressive craniectomy and hematoma evacuation, implantation of skull flap in right abdomen  Antibiotics:   Anti-infectives (From admission, onward)   Start     Dose/Rate Route Frequency Ordered Stop   09/27/19 1130  cephALEXin (KEFLEX) 250 MG/5ML suspension 500 mg     500 mg Per Tube Every 12  hours 09/27/19 1052 09/29/19 2250   09/25/19 1445  ceFAZolin (ANCEF) IVPB 2g/100 mL premix  Status:  Discontinued     2 g 200 mL/hr over 30 Minutes Intravenous Every 12 hours 09/25/19 1417 09/27/19 1052   09/14/19 1000  Ampicillin-Sulbactam (UNASYN) 3 g in sodium chloride 0.9 % 100 mL IVPB     3 g 200 mL/hr over 30 Minutes Intravenous Every 8 hours 09/14/19 0922 09/17/19 1816   09/13/19 1100  vancomycin (VANCOCIN) IVPB 750 mg/150 ml premix  Status:  Discontinued     750 mg 150 mL/hr over 60 Minutes Intravenous Every 24 hours 09/13/19 0728 09/13/19 0838   09/12/19 1100  vancomycin (VANCOCIN) IVPB 1000 mg/200 mL premix  Status:  Discontinued     1,000 mg 200 mL/hr over 60 Minutes Intravenous Every 24 hours 09/11/19 1034 09/13/19 0728   09/11/19 1130  piperacillin-tazobactam (ZOSYN) IVPB 3.375 g  Status:  Discontinued     3.375 g 12.5 mL/hr over 240 Minutes Intravenous Every 8 hours 09/11/19 1054 09/14/19 0845   09/11/19 1100  vancomycin (VANCOCIN) 1,500 mg in sodium chloride 0.9 % 500 mL IVPB     1,500 mg 250 mL/hr over 120 Minutes Intravenous  Once 09/11/19 1034 09/11/19 1900   09/11/19 1015  piperacillin-tazobactam (ZOSYN) IVPB 4.5 g  Status:  Discontinued     4.5 g 200 mL/hr over 30 Minutes Intravenous Every 8 hours 09/11/19 1010 09/11/19 1052   09/09/19 0845  ceFAZolin (ANCEF) IVPB 2g/100 mL premix     2 g 200 mL/hr over 30 Minutes Intravenous Every 8 hours 09/09/19 0842 09/09/19 1658       Objective   Vitals:   01/23/20 0006 01/23/20 0401 01/23/20 0500 01/23/20 0736  BP: 112/80 (!) 136/95  (!) 146/101  Pulse: (!) 122 98  96  Resp: 18 18    Temp: 97.9 F (36.6 C) 98.3 F (36.8 C)    TempSrc: Oral Oral    SpO2: 99% 100%  100%  Weight:   48.1 kg   Height:        Intake/Output Summary (Last 24 hours) at 01/23/2020 1129 Last data filed at 01/23/2020 0747 Gross per 24 hour  Intake 1248 ml  Output --  Net 1248 ml    01/24 1901 - 01/26 0700 In: 774  Out: -   Filed  Weights   01/21/20 0500 01/22/20 0500 01/23/20 0500  Weight: 55 kg 55 kg 48.1 kg    Physical Examination:  Appears in no acute distress  Refuse to be examined   Admission status: Inpatient: Based on patients clinical presentation and evaluation of above clinical data, I have made determination that patient meets Inpatient criteria at this time.   Oswald Hillock   [facility] If 7PM-7AM, please contact night-coverage at [email or web address], Office  [phone]  password [email or web address]  01/23/2020, 11:29 AM  LOS: 136 days

## 2020-01-23 NOTE — Progress Notes (Signed)
Nutrition Follow-up  DOCUMENTATION CODES:   Severe malnutrition in context of acute illness/injury  INTERVENTION:  ContinueOsmolite 1.2 formula via PEG at goal volume of 474 ml (2 cartons/ARCs) given QID.   Provide 30 ml Prostat TID per tube.   Free water flushes of 300 ml every 4 hours per tube. (MD to adjust as appropriate)  Tube feeding to provide 2575 kcal, 150 grams of protein, and 3355 ml free water.  Continue PO intake for comfort as appropriate/tolerated.  NUTRITION DIAGNOSIS:   Severe Malnutrition related to acute illness(intracerebral hemorrage) as evidenced by severe fat depletion, severe muscle depletion; ongoing  GOAL:   Patient will meet greater than or equal to 90% of their needs; met with TF  MONITOR:   TF tolerance, Skin, Weight trends, Labs, I & O's  REASON FOR ASSESSMENT:   Ventilator, Consult Enteral/tube feeding initiation and management  ASSESSMENT:   39 yo male admitted with large left intracranial hemorrhage; S/P craniotomy. PMH includes HTN, alcoholism, noncompliance.  9/12left frontal temporal craniectomy and evacuation hematoma.  9/23 Extubated.  10/1 PEG placed 11/23- Nutrition Focused Physical Exam performed, severe subcutaneous fat loss and severe muscle wasting identified.   Pt has been tolerating his bolus tube feeds well. Per RN, pt has been refusing medications on occasion. Pt refusing PO intake. RD to continue with current tube feeding orders and monitor for tolerance. Per MD, disposition continue to be challenging as he is uninsured and needs long-term placement. SNF pending. Labs and medications reviewed.   Diet Order:   Diet Order            Diet regular Room service appropriate? Yes; Fluid consistency: Thin  Diet effective now              EDUCATION NEEDS:   Not appropriate for education at this time  Skin:  Skin Assessment: Reviewed RN Assessment  Last BM:  1/23  Height:   Ht Readings from Last 1  Encounters:  09/09/19 '5\' 9"'  (1.753 m)    Weight:   Wt Readings from Last 1 Encounters:  01/23/20 48.1 kg    Ideal Body Weight:  72.7 kg  BMI:  Body mass index is 15.66 kg/m.  Estimated Nutritional Needs:   Kcal:  2200-2500  Protein:  120-150 grams  Fluid:  >/= 2 L/day    Corrin Parker, MS, RD, LDN Pager # [phone] After hours/ weekend pager # [phone]

## 2020-01-23 NOTE — Progress Notes (Signed)
PT Cancellation Note  Patient Details Name: Joshua [name] MRN: [id number] DOB: [birth year]   Cancelled Treatment:    Reason Eval/Treat Not Completed: (P) Patient declined, no reason specified(On arrival patient not speaking, asked patient if I could remove covers and he declines, shaking head no and swatting at PTA.  No tx performed today as pt is refusing.)   Cristela Blue 01/23/2020, 3:47 PM  [name] , PTA Acute Rehabilitation Services Pager [phone] Office [phone]

## 2020-01-24 DIAGNOSIS — J9601 Acute respiratory failure with hypoxia: Secondary | ICD-10-CM | POA: Diagnosis not present

## 2020-01-24 DIAGNOSIS — N179 Acute kidney failure, unspecified: Secondary | ICD-10-CM | POA: Diagnosis not present

## 2020-01-24 DIAGNOSIS — N184 Chronic kidney disease, stage 4 (severe): Secondary | ICD-10-CM | POA: Diagnosis not present

## 2020-01-24 DIAGNOSIS — I611 Nontraumatic intracerebral hemorrhage in hemisphere, cortical: Secondary | ICD-10-CM | POA: Diagnosis not present

## 2020-01-24 LAB — GLUCOSE, CAPILLARY
Glucose-Capillary: 94 mg/dL (ref 70–99)
Glucose-Capillary: 101 mg/dL — ABNORMAL HIGH (ref 70–99)
Glucose-Capillary: 176 mg/dL — ABNORMAL HIGH (ref 70–99)
Glucose-Capillary: 139 mg/dL — ABNORMAL HIGH (ref 70–99)
Glucose-Capillary: 96 mg/dL (ref 70–99)

## 2020-01-24 MED ORDER — JEVITY 1.2 CAL PO LIQD
474.0000 mL | Freq: Four times a day (QID) | ORAL | Status: DC
  Administered 2020-01-24 – 2020-05-02 (×352): 474 mL
  Filled 2020-01-24 (×403): qty 474

## 2020-01-24 MED ORDER — METOPROLOL TARTRATE 5 MG/5ML IV SOLN: 5.0000 mg | Freq: Four times a day (QID) | INTRAVENOUS | Status: DC | PRN

## 2020-01-24 NOTE — Progress Notes (Signed)
PROGRESS NOTE    Joshua [name]  [id number] DOB: [birth year] DOA: 09/09/2019 PCP: Patient, [name]   Brief Narrative: 39 year old male admitted by neurosurgery on 09/09/2019 for seizure, right hemiplegia and decreased responsiveness, found to have left hemispheric intracranial hemorrhage, intubated and admitted to ICU.  Underwent craniotomy for decompression on 09/09/2019 by Dr. Vertell Limber, patient showed slow neurological recovery and prolonged hospitalization due to seizure and aspiration pneumonia.  Patient continued to be awake but significant dysphagia, PEG tube was placed on 09/25/2019.  Placement has been challenging as patient is uninsured and needs long-term care.  Patient vomited tube feeding contents on the night of 01/06/2019.  CT abdomen was negative for obstruction.  Currently patient is tolerating tube feedings.  Awaiting placement.  01/24/2020-blood sugar dropped overnight ranging from 67, 211, 49, 96, 111. No other events overnight.  Assessment & Plan:   Principal Problem:   ICH (intracerebral hemorrhage) (HCC) Active Problems:   Subdural hematoma (HCC)   Intracerebral hemorrhage (HCC)   Cytotoxic brain edema (HCC)   Hemorrhagic stroke (HCC)   Acute respiratory failure ([place])   Essential hypertension   Severe protein-calorie malnutrition (HCC)   CKD (chronic kidney disease), stage IV (HCC)   Seizure (HCC)   AKI (acute kidney injury) ([place])    #1 left intracranial hemorrhage likely secondary to uncontrolled hypertension status post decompressive craniectomy and hematoma evacuation 09/09/2019. CT head no vascular malformation. Dr. Vertell Limber did not recommend replacement of the skull bone with lack of progression. Patient remains aphasic with right hemiplegia and severe cognitive impairment. He continues to refuse care and exam.  #2 seizure disorder on Keppra. Patient resists and refuses care and medications.  #3 status post Klebsiella UTI treated with cefazolin.  #4 hypoglycemia  on tube feeds discussed with dietary to adjust the tube feeds to improve his blood sugars. CBG (last 3)  Recent Labs    01/23/20 1942 01/23/20 2056 01/23/20 2344  GLUCAP 49* 96 111*    #5 hypertension on amlodipine and hydralazine. Blood pressure remains elevated. His blood pressure is 135/100 with heart rate between 10 5-1 14. Will add Lopressor.  #6 status post aspiration pneumonia completed course of IV antibiotics now with PEG tube in place.   #7 status post PEG tube continue tube feedings.  #8 AKI on CKD stage III resolved.     Nutrition Problem: Severe Malnutrition Etiology: acute illness(intracerebral hemorrage)     Signs/Symptoms: severe fat depletion, severe muscle depletion    Interventions: Tube feeding, Refer to RD note for recommendations  Estimated body mass index is 15.66 kg/m as calculated from the following:   Height as of this encounter: 5\' 9"  (1.753 m).   Weight as of this encounter: 48.1 kg.  DVT prophylaxis: Lovenox Code Status: Full code Family Communication: None Disposition Plan: Await SNF placement   Consultants:   Neurosurgery  Procedures: Decompressive craniectomy and hematoma evacuation and implantation of skull flap in the right abdomen  Antimicrobials:none  Subjective: Patient resting in bed he was asleep when I walked into the room when I said good morning he opened his eyes and kept pushing my hands out. Somehow I was able to take a look at his belly.  Objective: Vitals:   01/23/20 2007 01/23/20 2347 01/24/20 0349 01/24/20 0853  BP: (!) 136/101 (!) 124/95 (!) 146/103 (!) 135/100  Pulse: (!) 102 (!) 102 91 (!) 105  Resp: 16 16 16 18   Temp: 98.5 F (36.9 C) 98.2 F (36.8 C) 98.3 F (36.8 C) 98.4  F (36.9 C)  TempSrc: Oral Oral Oral Oral  SpO2: 100% 100% 100% 100%  Weight:      Height:       No intake or output data in the 24 hours ending 01/24/20 0920 Filed Weights   01/21/20 0500 01/22/20 0500 01/23/20 0500    Weight: 55 kg 55 kg 48.1 kg    Examination:  General exam: Appears calm and comfortable  Respiratory system: Clear to auscultation. Respiratory effort normal. Cardiovascular system: S1 & S2 heard, RRR. No JVD, murmurs, rubs, gallops or clicks. No pedal edema. Gastrointestinal system: Abdomen is nondistended, soft and nontender. No organomegaly or masses felt. Normal bowel sounds heard. PEG tube in place Central nervous system aphasic  extremities: No edema  skin: No rashes, lesions or ulcers Psychiatry: Unable to assess   Data Reviewed: I have personally reviewed following labs and imaging studies  CBC: No results for input(s): WBC, NEUTROABS, HGB, HCT, MCV, PLT in the last 168 hours. Basic Metabolic Panel: No results for input(s): NA, K, CL, CO2, GLUCOSE, BUN, CREATININE, CALCIUM, MG, PHOS in the last 168 hours. GFR: Estimated Creatinine Clearance: 54.3 mL/min (by C-G formula based on SCr of 1.23 mg/dL). Liver Function Tests: No results for input(s): AST, ALT, ALKPHOS, BILITOT, PROT, ALBUMIN in the last 168 hours. No results for input(s): LIPASE, AMYLASE in the last 168 hours. No results for input(s): AMMONIA in the last 168 hours. Coagulation Profile: No results for input(s): INR, PROTIME in the last 168 hours. Cardiac Enzymes: No results for input(s): CKTOTAL, CKMB, CKMBINDEX, TROPONINI in the last 168 hours. BNP (last 3 results) No results for input(s): PROBNP in the last 8760 hours. HbA1C: No results for input(s): HGBA1C in the last 72 hours. CBG: Recent Labs  Lab 01/23/20 1146 01/23/20 1604 01/23/20 1942 01/23/20 2056 01/23/20 2344  GLUCAP 67* 211* 49* 96 111*   Lipid Profile: No results for input(s): CHOL, HDL, LDLCALC, TRIG, CHOLHDL, LDLDIRECT in the last 72 hours. Thyroid Function Tests: No results for input(s): TSH, T4TOTAL, FREET4, T3FREE, THYROIDAB in the last 72 hours. Anemia Panel: No results for input(s): VITAMINB12, FOLATE, FERRITIN, TIBC, IRON,  RETICCTPCT in the last 72 hours. Sepsis Labs: No results for input(s): PROCALCITON, LATICACIDVEN in the last 168 hours.  No results found for this or any previous visit (from the past 240 hour(s)).       Radiology Studies: No results found.      Scheduled Meds: . amLODipine  10 mg Per Tube Daily  . chlorhexidine  15 mL Mouth Rinse BID  . cholecalciferol  1,000 Units Oral Daily  . dextrose  1 Tube Oral Once  . dextrose  25 g Intravenous Once  . enoxaparin (LOVENOX) injection  40 mg Subcutaneous Daily  . feeding supplement (OSMOLITE 1.2 CAL)  474 mL Per Tube TID AC & HS  . feeding supplement (PRO-STAT SUGAR FREE 64)  30 mL Per Tube TID  . folic acid  1 mg Per Tube Daily  . free water  300 mL Per Tube Q4H  . hydrALAZINE  25 mg Per Tube Q8H  . insulin aspart  0-15 Units Subcutaneous Q4H  . levETIRAcetam  500 mg Per Tube BID  . mouth rinse  15 mL Mouth Rinse q12n4p  . multivitamin  15 mL Per Tube Daily  . QUEtiapine  50 mg Per Tube QHS  . thiamine  100 mg Per Tube Daily   Continuous Infusions:   LOS: 137 days     Georgette Shell,  MD [facility]  If 7PM-7AM, please contact night-coverage [email or web address] Password [email or web address] 01/24/2020, 9:20 AM

## 2020-01-24 NOTE — Progress Notes (Signed)
PT Cancellation Note  Patient Details Name: Joshua [name] MRN: [id number] DOB: 07-01-1981   Cancelled Treatment:    Reason Eval/Treat Not Completed: (P) Patient declined, no reason specified(Pt continues to swat PTA and OT away when attempting to perform tx.  Spoke with his mother and his brother Graham.  His brother reports he will be present for session tomorrow.)   Cristela Blue 01/24/2020, 3:22 PM  [name] , PTA Acute Rehabilitation Services Pager [phone] Office [phone]

## 2020-01-24 NOTE — Progress Notes (Signed)
OT Cancellation Note  Patient Details Name: Joshua [name] MRN: [id number] DOB: 1981/07/06   Cancelled Treatment:    Reason Eval/Treat Not Completed: Patient declined, no reason specified Pt persistently declined despite education on importance and benefit of working with therapy. Pt's mom called 2x while therapist was in the room, pt hung up on mom. Pt continued to swat OT and PTA away. PT spoke with family and pt's brother reports he will be present tomorrow. Will continue to follow.   Dorinda Hill OTR/L Acute Rehabilitation Services Office: [phone]   Wyn Forster 01/24/2020, 3:50 PM

## 2020-01-24 NOTE — Progress Notes (Signed)
Nutrition Follow-up  DOCUMENTATION CODES:   Severe malnutrition in context of acute illness/injury  INTERVENTION:  Discontinue Osmolite 1.2 formula.  Initiate Jevity 1.2 formula via PEG at goal volume of 474 ml (2 cartons/ARCs) given QID.   Provide 30 ml ProstatTID per tube.   Free water flushes of 300 ml every 4 hours per tube. (MD to adjust as appropriate)  Tube feeding to provide 2575 kcal, 150grams of protein, and 3336 ml free water.  Continue PO intake for comfort as appropriate/tolerated.  NUTRITION DIAGNOSIS:   Severe Malnutrition related to acute illness(intracerebral hemorrage) as evidenced by severe fat depletion, severe muscle depletion; ongoing  GOAL:   Patient will meet greater than or equal to 90% of their needs; met with TF  MONITOR:   TF tolerance, Skin, Weight trends, Labs, I & O's  REASON FOR ASSESSMENT:   Ventilator, Consult Enteral/tube feeding initiation and management  ASSESSMENT:   39 yo male admitted with large left intracranial hemorrhage; S/P craniotomy. PMH includes HTN, alcoholism, noncompliance.  9/12left frontal temporal craniectomy and evacuation hematoma.  9/23 Extubated.  10/1 PEG placed 11/23- Nutrition Focused Physical Exam performed, severe subcutaneous fat loss and severe muscle wasting identified.   Pt with hypoglycemia yesterday evening. RD contact via MD regarding tube feeding adjustment to improve blood glucose. Noted pt refuses medications via tube on occasion despite max encouragement from staff. Pt refused 2 bolus feeds yesterday, however most of the other times tube feeding is administered full and well. RD to order Jevity 1.2 formula instead (which contains fiber) to aid in blood glucose maintenance. RD to continue to monitor. Labs and medications reviewed.   Diet Order:   Diet Order            Diet regular Room service appropriate? Yes; Fluid consistency: Thin  Diet effective now              EDUCATION  NEEDS:   Not appropriate for education at this time  Skin:  Skin Assessment: Reviewed RN Assessment  Last BM:  1/23  Height:   Ht Readings from Last 1 Encounters:  09/09/19 _0  (1.753 m)    Weight:   Wt Readings from Last 1 Encounters:  01/23/20 48.1 kg    Ideal Body Weight:  72.7 kg  BMI:  Body mass index is 15.66 kg/m.  Estimated Nutritional Needs:   Kcal:  2200-2500  Protein:  120-150 grams  Fluid:  >/= 2 L/day    Corrin Parker, MS, RD, LDN Pager # [phone] After hours/ weekend pager # [phone]

## 2020-01-25 DIAGNOSIS — J9601 Acute respiratory failure with hypoxia: Secondary | ICD-10-CM | POA: Diagnosis not present

## 2020-01-25 DIAGNOSIS — N179 Acute kidney failure, unspecified: Secondary | ICD-10-CM | POA: Diagnosis not present

## 2020-01-25 DIAGNOSIS — I611 Nontraumatic intracerebral hemorrhage in hemisphere, cortical: Secondary | ICD-10-CM | POA: Diagnosis not present

## 2020-01-25 LAB — GLUCOSE, CAPILLARY
Glucose-Capillary: 116 mg/dL — ABNORMAL HIGH (ref 70–99)
Glucose-Capillary: 123 mg/dL — ABNORMAL HIGH (ref 70–99)
Glucose-Capillary: 94 mg/dL (ref 70–99)
Glucose-Capillary: 95 mg/dL (ref 70–99)
Glucose-Capillary: 99 mg/dL (ref 70–99)
Glucose-Capillary: 99 mg/dL (ref 70–99)

## 2020-01-25 MED ORDER — METOPROLOL TARTRATE 12.5 MG HALF TABLET
12.5000 mg | ORAL_TABLET | Freq: Two times a day (BID) | ORAL | Status: DC
  Administered 2020-01-25 – 2020-01-27 (×5): 12.5 mg via ORAL
  Filled 2020-01-25 (×5): qty 1

## 2020-01-25 NOTE — Progress Notes (Signed)
PROGRESS NOTE    Joshua [name]  [id number] DOB: 08/12/1981 DOA: 09/09/2019 PCP: Patient, [name]   Brief Narrative:39 year old male admitted by neurosurgery on 09/09/2019 for seizure, right hemiplegia and decreased responsiveness, found to have left hemispheric intracranial hemorrhage, intubated and admitted to ICU. Underwent craniotomy for decompression on 09/09/2019 by Dr. Vertell Limber, patient showed slow neurological recovery and prolonged hospitalization due to seizure and aspiration pneumonia. Patient continued to be awake but significant dysphagia, PEG tube was placed on 09/25/2019. Placement has been challenging as patient is uninsured and needs long-term care. Patient vomited tube feeding contents on the night of 01/06/2019. CT abdomen was negative for obstruction. Currently patient is tolerating tube feedings. Awaiting placement.  01/24/2020-blood sugar dropped overnight ranging from 67, 211, 49, 96, 111. No other events overnight.  1/28-no changes continues to refuse care  Assessment & Plan:   Principal Problem:   ICH (intracerebral hemorrhage) ([place]) Active Problems:   Subdural hematoma (HCC)   Intracerebral hemorrhage (HCC)   Cytotoxic brain edema (HCC)   Hemorrhagic stroke (HCC)   Acute respiratory failure ([place])   Essential hypertension   Severe protein-calorie malnutrition (HCC)   CKD (chronic kidney disease), stage IV (HCC)   Seizure (HCC)   AKI (acute kidney injury) ([place])    #1 left intracranial hemorrhage likely secondary to uncontrolled hypertension status post decompressive craniectomy and hematoma evacuation 09/09/2019. CT head no vascular malformation. Dr. Vertell Limber did not recommend replacement of the skull bone with lack of progression. Patient remains aphasic with right hemiplegia and severe cognitive impairment. He continues to refuse care and exam.  #2 seizure disorder on Keppra. Patient resists and refuses care and medications.  #3 status post Klebsiella  UTI treated with cefazolin.  #4 hypoglycemia on tube feeds discussed with dietary to adjust the tube feeds to improve his blood sugars.improved..  CBG (last 3)  Recent Labs    01/25/20 0010 01/25/20 0736 01/25/20 1229  GLUCAP 123* 99 116*   CBG (last 3)  Recent Labs (last 2 labs)        Recent Labs    01/23/20 1942 01/23/20 2056 01/23/20 2344  GLUCAP 49* 96 111*      #5 hypertension -149/99 continue amlodipine and hydralazine.  Lopressor added 01/24/2020.    #6 status post aspiration pneumonia completed course of IV antibiotics now with PEG tube in place.   #7 status post PEG tube continue tube feedings.  #8 AKI on CKD stage III resolved.       Nutrition Problem: Severe Malnutrition Etiology: acute illness(intracerebral hemorrage)     Signs/Symptoms: severe fat depletion, severe muscle depletion    Interventions: Tube feeding, Refer to RD note for recommendations  Estimated body mass index is 15.66 kg/m as calculated from the following:   Height as of this encounter: 5\' 9"  (1.753 m).   Weight as of this encounter: 48.1 kg. DVT prophylaxis: Lovenox Code Status: Full code Family Communication: None Disposition Plan: Await SNF placement   Consultants:   Neurosurgery  Procedures: Decompressive craniectomy and hematoma evacuation and implantation of skull flap in the right abdomen  Antimicrobials:none   Subjective: No changes here continues to refuse care and my exam today  Objective: Vitals:   01/25/20 0407 01/25/20 0423 01/25/20 0733 01/25/20 1232  BP: (!) 133/98 (!) 133/98 (!) 142/101 (!) 149/99  Pulse: 99  95 (!) 110  Resp: 17  18 18   Temp: 97.6 F (36.4 C)  98.2 F (36.8 C) 98.1 F (36.7 C)  TempSrc:  Oral Oral  SpO2: 100%  100%   Weight:      Height:        Intake/Output Summary (Last 24 hours) at 01/25/2020 1402 Last data filed at 01/25/2020 0029 Gross per 24 hour  Intake 474 ml  Output --  Net 474 ml   Filed  Weights   01/21/20 0500 01/22/20 0500 01/23/20 0500  Weight: 55 kg 55 kg 48.1 kg    Examination:  General exam: Appears calm and comfortable  Patient refused to be examined  Data Reviewed: I have personally reviewed following labs and imaging studies  CBC: No results for input(s): WBC, NEUTROABS, HGB, HCT, MCV, PLT in the last 168 hours. Basic Metabolic Panel: No results for input(s): NA, K, CL, CO2, GLUCOSE, BUN, CREATININE, CALCIUM, MG, PHOS in the last 168 hours. GFR: Estimated Creatinine Clearance: 54.3 mL/min (by C-G formula based on SCr of 1.23 mg/dL). Liver Function Tests: No results for input(s): AST, ALT, ALKPHOS, BILITOT, PROT, ALBUMIN in the last 168 hours. No results for input(s): LIPASE, AMYLASE in the last 168 hours. No results for input(s): AMMONIA in the last 168 hours. Coagulation Profile: No results for input(s): INR, PROTIME in the last 168 hours. Cardiac Enzymes: No results for input(s): CKTOTAL, CKMB, CKMBINDEX, TROPONINI in the last 168 hours. BNP (last 3 results) No results for input(s): PROBNP in the last 8760 hours. HbA1C: No results for input(s): HGBA1C in the last 72 hours. CBG: Recent Labs  Lab 01/24/20 1616 01/24/20 2025 01/25/20 0010 01/25/20 0736 01/25/20 1229  GLUCAP 139* 96 123* 99 116*   Lipid Profile: No results for input(s): CHOL, HDL, LDLCALC, TRIG, CHOLHDL, LDLDIRECT in the last 72 hours. Thyroid Function Tests: No results for input(s): TSH, T4TOTAL, FREET4, T3FREE, THYROIDAB in the last 72 hours. Anemia Panel: No results for input(s): VITAMINB12, FOLATE, FERRITIN, TIBC, IRON, RETICCTPCT in the last 72 hours. Sepsis Labs: No results for input(s): PROCALCITON, LATICACIDVEN in the last 168 hours.  No results found for this or any previous visit (from the past 240 hour(s)).       Radiology Studies: No results found.      Scheduled Meds: . amLODipine  10 mg Per Tube Daily  . chlorhexidine  15 mL Mouth Rinse BID  .  cholecalciferol  1,000 Units Oral Daily  . dextrose  1 Tube Oral Once  . dextrose  25 g Intravenous Once  . enoxaparin (LOVENOX) injection  40 mg Subcutaneous Daily  . feeding supplement (JEVITY 1.2 CAL)  474 mL Per Tube 4x daily  . feeding supplement (PRO-STAT SUGAR FREE 64)  30 mL Per Tube TID  . folic acid  1 mg Per Tube Daily  . free water  300 mL Per Tube Q4H  . hydrALAZINE  25 mg Per Tube Q8H  . insulin aspart  0-15 Units Subcutaneous Q4H  . levETIRAcetam  500 mg Per Tube BID  . mouth rinse  15 mL Mouth Rinse q12n4p  . multivitamin  15 mL Per Tube Daily  . QUEtiapine  50 mg Per Tube QHS  . thiamine  100 mg Per Tube Daily   Continuous Infusions:   LOS: 138 days     Georgette Shell, MD [facility] If 7PM-7AM, please contact night-coverage [email or web address] Password [email or web address] 01/25/2020, 2:02 PM

## 2020-01-25 NOTE — Progress Notes (Addendum)
PT Cancellation Note and Discharge  Patient Details Name: Joshua [name] MRN: [id number] DOB: Apr 24, 1981   Cancelled Treatment:    Reason Eval/Treat Not Completed: Patient declined, no reason specified Patient is being discharged from PT/ OT/ SLP services secondary to: Patient has refused 3 consecutive times without medical reason and despite maximal encouragement for participation and importance of mobility. Pt brother, [name], present and trying to assist with encouraging pt to get out of bed, however, pt actively resisting. Pt swatting pt hand away with attempts to move blanket or initiate mobilization. PT signing off at this time due to reasoning listed above. Please re-consult if pt willing to work with therapy.  Ellamae Sia, PT, DPT Acute Rehabilitation Services Pager [phone] Office [phone]       Deno Etienne 01/25/2020, 12:28 PM

## 2020-01-25 NOTE — Progress Notes (Signed)
Refused his am BP med Hydralazine by pushing hand away and placing his hands over PeG tube. Uncooperative w/ free water flush scheduled this am as well.

## 2020-01-26 DIAGNOSIS — I611 Nontraumatic intracerebral hemorrhage in hemisphere, cortical: Secondary | ICD-10-CM | POA: Diagnosis not present

## 2020-01-26 DIAGNOSIS — J9601 Acute respiratory failure with hypoxia: Secondary | ICD-10-CM | POA: Diagnosis not present

## 2020-01-26 LAB — GLUCOSE, CAPILLARY
Glucose-Capillary: 105 mg/dL — ABNORMAL HIGH (ref 70–99)
Glucose-Capillary: 152 mg/dL — ABNORMAL HIGH (ref 70–99)
Glucose-Capillary: 178 mg/dL — ABNORMAL HIGH (ref 70–99)
Glucose-Capillary: 89 mg/dL (ref 70–99)
Glucose-Capillary: 92 mg/dL (ref 70–99)
Glucose-Capillary: 96 mg/dL (ref 70–99)

## 2020-01-26 NOTE — Progress Notes (Signed)
PROGRESS NOTE    Joshua [name]  [id number] DOB: [birth year] DOA: 09/09/2019 PCP: Patient, [name]   Brief Narrative:  39 year old male admitted by neurosurgery on 09/09/2019 for seizure, right hemiplegia and decreased responsiveness, found to have left hemispheric intracranial hemorrhage, intubated and admitted to ICU. Underwent craniotomy for decompression on 09/09/2019 by Dr. Vertell Limber, patient showed slow neurological recovery and prolonged hospitalization due to seizure and aspiration pneumonia. Patient continued to be awake but significant dysphagia, PEG tube was placed on 09/25/2019. Placement has been challenging as patient is uninsured and needs long-term care. Patient vomited tube feeding contents on the night of 01/06/2019. CT abdomen was negative for obstruction. Currently patient is tolerating tube feedings. Awaiting placement.  01/24/2020-blood sugar dropped overnight ranging from 67, 211, 49, 96, 111. No other events overnight.  01/26/2020 no new events overnight patient continues to refuse care.  Assessment & Plan:   Principal Problem:   ICH (intracerebral hemorrhage) (HCC) Active Problems:   Subdural hematoma (HCC)   Intracerebral hemorrhage (HCC)   Cytotoxic brain edema (HCC)   Hemorrhagic stroke (HCC)   Acute respiratory failure ([place])   Essential hypertension   Severe protein-calorie malnutrition (HCC)   CKD (chronic kidney disease), stage IV (HCC)   Seizure (HCC)   AKI (acute kidney injury) ([place])   #1 left intracranial hemorrhage likely secondary to uncontrolled hypertension status post decompressive craniectomy and hematoma evacuation 09/09/2019. CT head no vascular malformation. Dr. Vertell Limber did not recommend replacement of the skull bone with lack of progression. Patient remains aphasic with right hemiplegia and severe cognitive impairment. He continues to refuse care and exam.  #2 seizure disorder on Keppra. Patient resists and refuses care and  medications.  #3 status post Klebsiella UTI treated with cefazolin.  #4 hypoglycemia-improved  CBG (last 3)  Recent Labs    01/26/20 0356 01/26/20 0813 01/26/20 1204  GLUCAP 96 92 152*   CBG (last 3)  Recent Labs (last 2 labs)        Recent Labs    01/23/20 1942 01/23/20 2056 01/23/20 2344  GLUCAP 49* 96 111*      #5 hypertension pressure 135/99 continue amlodipine hydralazine.  Continue Lopressor  #6 status post aspiration pneumonia completed course of IV antibiotics now with PEG tube in place.   #7 status post PEG tube continue tube feedings.  #8 AKI on CKD stage III resolved.    Nutrition Problem: Severe Malnutrition Etiology: acute illness(intracerebral hemorrage)     Signs/Symptoms: severe fat depletion, severe muscle depletion    Interventions: Tube feeding, Refer to RD note for recommendations  Estimated body mass index is 15.66 kg/m as calculated from the following:   Height as of this encounter: 5\' 9"  (1.753 m).   Weight as of this encounter: 48.1 kg.  DVT prophylaxis:Lovenox Code Status:Full code Family Communication:None Disposition Plan:Await SNF placement   Consultants:  Neurosurgery  Procedures:Decompressive craniectomy and hematoma evacuation and implantation of skull flap in the right abdomen  Antimicrobials:none Subjective:  No new changes continues to refuse care Objective: Vitals:   01/26/20 0011 01/26/20 0359 01/26/20 0816 01/26/20 1205  BP: (!) 136/91 121/88 (!) 135/99 (!) (P) 140/99  Pulse: 100 93 97 (P) 92  Resp: 16 20 20  (P) 20  Temp: 98.7 F (37.1 C) 99.3 F (37.4 C) 98.4 F (36.9 C) (P) 97.7 F (36.5 C)  TempSrc: Oral  Oral (P) Oral  SpO2: 100% 100% 100% (P) 100%  Weight:      Height:  Intake/Output Summary (Last 24 hours) at 01/26/2020 1506 Last data filed at 01/26/2020 1000 Gross per 24 hour  Intake 11 ml  Output --  Net 11 ml   Filed Weights   01/21/20 0500 01/22/20 0500  01/23/20 0500  Weight: 55 kg 55 kg 48.1 kg    Examination: Patient refused exam  General exam: Appears calm and comfortable     Data Reviewed: I have personally reviewed following labs and imaging studies  CBC: No results for input(s): WBC, NEUTROABS, HGB, HCT, MCV, PLT in the last 168 hours. Basic Metabolic Panel: No results for input(s): NA, K, CL, CO2, GLUCOSE, BUN, CREATININE, CALCIUM, MG, PHOS in the last 168 hours. GFR: Estimated Creatinine Clearance: 54.3 mL/min (by C-G formula based on SCr of 1.23 mg/dL). Liver Function Tests: No results for input(s): AST, ALT, ALKPHOS, BILITOT, PROT, ALBUMIN in the last 168 hours. No results for input(s): LIPASE, AMYLASE in the last 168 hours. No results for input(s): AMMONIA in the last 168 hours. Coagulation Profile: No results for input(s): INR, PROTIME in the last 168 hours. Cardiac Enzymes: No results for input(s): CKTOTAL, CKMB, CKMBINDEX, TROPONINI in the last 168 hours. BNP (last 3 results) No results for input(s): PROBNP in the last 8760 hours. HbA1C: No results for input(s): HGBA1C in the last 72 hours. CBG: Recent Labs  Lab 01/25/20 2012 01/26/20 0009 01/26/20 0356 01/26/20 0813 01/26/20 1204  GLUCAP 99 178* 96 92 152*   Lipid Profile: No results for input(s): CHOL, HDL, LDLCALC, TRIG, CHOLHDL, LDLDIRECT in the last 72 hours. Thyroid Function Tests: No results for input(s): TSH, T4TOTAL, FREET4, T3FREE, THYROIDAB in the last 72 hours. Anemia Panel: No results for input(s): VITAMINB12, FOLATE, FERRITIN, TIBC, IRON, RETICCTPCT in the last 72 hours. Sepsis Labs: No results for input(s): PROCALCITON, LATICACIDVEN in the last 168 hours.  No results found for this or any previous visit (from the past 240 hour(s)).       Radiology Studies: No results found.      Scheduled Meds: . amLODipine  10 mg Per Tube Daily  . chlorhexidine  15 mL Mouth Rinse BID  . cholecalciferol  1,000 Units Oral Daily  . dextrose   1 Tube Oral Once  . dextrose  25 g Intravenous Once  . enoxaparin (LOVENOX) injection  40 mg Subcutaneous Daily  . feeding supplement (JEVITY 1.2 CAL)  474 mL Per Tube 4x daily  . feeding supplement (PRO-STAT SUGAR FREE 64)  30 mL Per Tube TID  . folic acid  1 mg Per Tube Daily  . free water  300 mL Per Tube Q4H  . hydrALAZINE  25 mg Per Tube Q8H  . insulin aspart  0-15 Units Subcutaneous Q4H  . levETIRAcetam  500 mg Per Tube BID  . mouth rinse  15 mL Mouth Rinse q12n4p  . metoprolol tartrate  12.5 mg Oral BID  . multivitamin  15 mL Per Tube Daily  . QUEtiapine  50 mg Per Tube QHS  . thiamine  100 mg Per Tube Daily   Continuous Infusions:   LOS: 139 days     Georgette Shell, MD [facility]  If 7PM-7AM, please contact night-coverage [email or web address] Password [facility] 01/26/2020, 3:06 PM

## 2020-01-27 DIAGNOSIS — I611 Nontraumatic intracerebral hemorrhage in hemisphere, cortical: Secondary | ICD-10-CM | POA: Diagnosis not present

## 2020-01-27 DIAGNOSIS — J9601 Acute respiratory failure with hypoxia: Secondary | ICD-10-CM | POA: Diagnosis not present

## 2020-01-27 LAB — GLUCOSE, CAPILLARY: Glucose-Capillary: 180 mg/dL — ABNORMAL HIGH (ref 70–99)

## 2020-01-27 MED ORDER — METOPROLOL TARTRATE 25 MG PO TABS
25.0000 mg | ORAL_TABLET | Freq: Two times a day (BID) | ORAL | Status: DC
  Administered 2020-01-27 – 2020-02-07 (×22): 25 mg via ORAL
  Filled 2020-01-27 (×24): qty 1

## 2020-01-27 NOTE — Progress Notes (Signed)
PROGRESS NOTE    Joshua [name]  [id number] DOB: Nov 13, 1981 DOA: 09/09/2019 PCP: Joshua [name], [name]    Brief Narrative:39 year old male admitted by neurosurgery on 09/09/2019 for seizure, right hemiplegia and decreased responsiveness, found to have left hemispheric intracranial hemorrhage, intubated and admitted to ICU. Underwent craniotomy for decompression on 09/09/2019 by Dr. Vertell Limber, Joshua [name] showed slow neurological recovery and prolonged hospitalization due to seizure and aspiration pneumonia. Joshua [name] continued to be awake but significant dysphagia, PEG tube was placed on 09/25/2019. Placement has been challenging as Joshua [name] is uninsured and needs long-term care. Joshua [name] vomited tube feeding contents on the night of 01/06/2019. CT abdomen was negative for obstruction. Currently Joshua [name] is tolerating tube feedings. Awaiting placement.  01/24/2020-blood sugar dropped overnight ranging from 67, 211, 49, 96, 111. No other events overnight.  01/26/2020 no new events overnight Joshua [name] continues to refuse care.  01/27/2020 Joshua [name] resting in bed in no acute distress   Assessment & Plan:   Principal Problem:   ICH (intracerebral hemorrhage) (HCC) Active Problems:   Subdural hematoma (HCC)   Intracerebral hemorrhage (HCC)   Cytotoxic brain edema (HCC)   Hemorrhagic stroke (HCC)   Acute respiratory failure ([place])   Essential hypertension   Severe protein-calorie malnutrition ([place])   CKD (chronic kidney disease), stage IV (HCC)   Seizure (HCC)   AKI (acute kidney injury) ([place])    #1 left intracranial hemorrhage likely secondary to uncontrolled hypertension status post decompressive craniectomy and hematoma evacuation 09/09/2019. CT head no vascular malformation. Dr. Vertell Limber did not recommend replacement of the skull bone with lack of progression. Joshua [name] remains aphasic with right hemiplegia and severe cognitive impairment. He continues to refuse care and exam.  #2 seizure disorder on  Keppra. Joshua [name] resists and refuses care and medications.  #3 status post Klebsiella UTI treated with cefazolin.  #4 hypoglycemia-resolved CBG (last 3)  Recent Labs    01/26/20 1606 01/26/20 1944 01/27/20 0028  GLUCAP 89 105* 180*    #5 hypertension pressure-133/93 pulse of 114.  Increase metoprolol to 25 twice daily.  Continue amlodipine 10 mg daily and hydralazine 25 mg every 8.    #6 status post aspiration pneumonia completed course of IV antibiotics now with PEG tube in place.   #7 status post PEG tube continue tube feedings.  #8 AKI on CKD stage III resolved.  Nutrition Problem: Severe Malnutrition Etiology: acute illness(intracerebral hemorrage)     Signs/Symptoms: severe fat depletion, severe muscle depletion    Interventions: Tube feeding, Refer to RD note for recommendations  Estimated body mass index is 15.66 kg/m as calculated from the following:   Height as of this encounter: 5\' 9"  (1.753 m).   Weight as of this encounter: 48.1 kg. DVT prophylaxis:Lovenox Code Status:Full code Family Communication:None Disposition Plan:Await SNF placement   Consultants:  Neurosurgery  Procedures:Decompressive craniectomy and hematoma evacuation and implantation of skull flap in the right abdomen  Antimicrobials:none   Subjective: In bed no acute distress refuses to eat anything tolerating tube feeds.  Objective: Vitals:   01/26/20 2341 01/27/20 0348 01/27/20 0836 01/27/20 1231  BP: (!) 135/96 113/83 (!) 138/92 (!) 133/93  Pulse: 95 92 90 96  Resp: 18 19 12 14   Temp: 98.1 F (36.7 C) 98 F (36.7 C) (!) 97.5 F (36.4 C) 98.8 F (37.1 C)  TempSrc: Oral Oral Oral Oral  SpO2: 99% 100% 100% 94%  Weight:      Height:       No intake or output data in the 24 hours  ending 01/27/20 1243 Filed Weights   01/21/20 0500 01/22/20 0500 01/23/20 0500  Weight: 55 kg 55 kg 48.1 kg    Examination: Not done as Joshua [name] refuses exam   Data Reviewed:  I have personally reviewed following labs and imaging studies  CBC: No results for input(s): WBC, NEUTROABS, HGB, HCT, MCV, PLT in the last 168 hours. Basic Metabolic Panel: No results for input(s): NA, K, CL, CO2, GLUCOSE, BUN, CREATININE, CALCIUM, MG, PHOS in the last 168 hours. GFR: Estimated Creatinine Clearance: 54.3 mL/min (by C-G formula based on SCr of 1.23 mg/dL). Liver Function Tests: No results for input(s): AST, ALT, ALKPHOS, BILITOT, PROT, ALBUMIN in the last 168 hours. No results for input(s): LIPASE, AMYLASE in the last 168 hours. No results for input(s): AMMONIA in the last 168 hours. Coagulation Profile: No results for input(s): INR, PROTIME in the last 168 hours. Cardiac Enzymes: No results for input(s): CKTOTAL, CKMB, CKMBINDEX, TROPONINI in the last 168 hours. BNP (last 3 results) No results for input(s): PROBNP in the last 8760 hours. HbA1C: No results for input(s): HGBA1C in the last 72 hours. CBG: Recent Labs  Lab 01/26/20 0813 01/26/20 1204 01/26/20 1606 01/26/20 1944 01/27/20 0028  GLUCAP 92 152* 89 105* 180*   Lipid Profile: No results for input(s): CHOL, HDL, LDLCALC, TRIG, CHOLHDL, LDLDIRECT in the last 72 hours. Thyroid Function Tests: No results for input(s): TSH, T4TOTAL, FREET4, T3FREE, THYROIDAB in the last 72 hours. Anemia Panel: No results for input(s): VITAMINB12, FOLATE, FERRITIN, TIBC, IRON, RETICCTPCT in the last 72 hours. Sepsis Labs: No results for input(s): PROCALCITON, LATICACIDVEN in the last 168 hours.  No results found for this or any previous visit (from the past 240 hour(s)).       Radiology Studies: No results found.      Scheduled Meds: . amLODipine  10 mg Per Tube Daily  . chlorhexidine  15 mL Mouth Rinse BID  . cholecalciferol  1,000 Units Oral Daily  . dextrose  1 Tube Oral Once  . dextrose  25 g Intravenous Once  . enoxaparin (LOVENOX) injection  40 mg Subcutaneous Daily  . feeding supplement (JEVITY 1.2  CAL)  474 mL Per Tube 4x daily  . feeding supplement (PRO-STAT SUGAR FREE 64)  30 mL Per Tube TID  . folic acid  1 mg Per Tube Daily  . free water  300 mL Per Tube Q4H  . hydrALAZINE  25 mg Per Tube Q8H  . insulin aspart  0-15 Units Subcutaneous Q4H  . levETIRAcetam  500 mg Per Tube BID  . mouth rinse  15 mL Mouth Rinse q12n4p  . metoprolol tartrate  12.5 mg Oral BID  . multivitamin  15 mL Per Tube Daily  . QUEtiapine  50 mg Per Tube QHS  . thiamine  100 mg Per Tube Daily   Continuous Infusions:   LOS: 140 days     Georgette Shell, MD [facility]  If 7PM-7AM, please contact night-coverage [email or web address] Password [email or web address] 01/27/2020, 12:43 PM

## 2020-01-28 DIAGNOSIS — I611 Nontraumatic intracerebral hemorrhage in hemisphere, cortical: Secondary | ICD-10-CM | POA: Diagnosis not present

## 2020-01-28 DIAGNOSIS — J9601 Acute respiratory failure with hypoxia: Secondary | ICD-10-CM | POA: Diagnosis not present

## 2020-01-28 NOTE — Progress Notes (Signed)
PROGRESS NOTE    Joshua [name]  [id number] DOB: Feb 05, 1981 DOA: 09/09/2019 PCP: Patient, [name]   Brief Narrative: 39 year old male admitted by neurosurgery on 09/09/2019 for seizure, right hemiplegia and decreased responsiveness, found to have left hemispheric intracranial hemorrhage, intubated and admitted to ICU. Underwent craniotomy for decompression on 09/09/2019 by Dr. Vertell Limber, patient showed slow neurological recovery and prolonged hospitalization due to seizure and aspiration pneumonia. Patient continued to be awake but significant dysphagia, PEG tube was placed on 09/25/2019. Placement has been challenging as patient is uninsured and needs long-term care. Patient vomited tube feeding contents on the night of 01/06/2019. CT abdomen was negative for obstruction. Currently patient is tolerating tube feedings. Awaiting placement.  01/24/2020-blood sugar dropped overnight ranging from 67, 211, 49, 96, 111. No other events overnight.  01/26/2020 no new events overnight patient continues to refuse care.  01/27/2020 patient resting in bed in no acute distress   1/31 no new issues dw his mom concerned why hes not eating and refusing care  Assessment & Plan:   Principal Problem:   ICH (intracerebral hemorrhage) ([place]) Active Problems:   Subdural hematoma (HCC)   Intracerebral hemorrhage (HCC)   Cytotoxic brain edema (HCC)   Hemorrhagic stroke ([place])   Acute respiratory failure ([place])   Essential hypertension   Severe protein-calorie malnutrition ([place])   CKD (chronic kidney disease), stage IV (HCC)   Seizure (HCC)   AKI (acute kidney injury) ([place])   #1 left intracranial hemorrhage likely secondary to uncontrolled hypertension status post decompressive craniectomy and hematoma evacuation 09/09/2019. CT head no vascular malformation. Dr. Vertell Limber did not recommend replacement of the skull bone with lack of progression. Patient remains aphasic with right hemiplegia and severe  cognitive impairment. He continues to refuse care and exam.  #2 seizure disorder on Keppra. Patient resists and refuses care and medications.  #3 status post Klebsiella UTI treated with cefazolin.  #4 hypoglycemia-resolved CBG (last 3)  Recent Labs    01/26/20 1606 01/26/20 1944 01/27/20 0028  GLUCAP 89 105* 180*      #5 hypertensionpressure-124/95 pulse of 114.  continue  metoprolol to 25 twice daily.  Continue amlodipine 10 mg daily and hydralazine 25 mg every 8.    #6 status post aspiration pneumonia completed course of IV antibiotics now with PEG tube in place.   #7 status post PEG tube continue tube feedings.  #8 AKI on CKD stage III resolved.   Nutrition Problem: Severe Malnutrition Etiology: acute illness(intracerebral hemorrage)     Signs/Symptoms: severe fat depletion, severe muscle depletion    Interventions: Tube feeding, Refer to RD note for recommendations  Estimated body mass index is 15.66 kg/m as calculated from the following:   Height as of this encounter: 5\' 9"  (1.753 m).   Weight as of this encounter: 48.1 kg.  DVT prophylaxis:Lovenox Code Status:Full code Family Communication:None Disposition Plan:Await SNF placement   Consultants:  Neurosurgery  Procedures:Decompressive craniectomy and hematoma evacuation and implantation of skull flap in the right abdomen  Antimicrobials:none   Subjective:  Resting in bed in nad  Objective: Vitals:   01/27/20 1944 01/28/20 0000 01/28/20 0346 01/28/20 0800  BP: (!) 140/98 (!) 133/91 122/89 (!) 124/95  Pulse: (!) 108 99 97 92  Resp: 16 18 20 18   Temp: 98.3 F (36.8 C) 98.6 F (37 C) 98.8 F (37.1 C) 97.8 F (36.6 C)  TempSrc: Oral Oral Oral Oral  SpO2: 100% 100% 100% 100%  Weight:      Height:  No intake or output data in the 24 hours ending 01/28/20 1206 Filed Weights   01/21/20 0500 01/22/20 0500 01/23/20 0500  Weight: 55 kg 55 kg 48.1 kg     Examination:  General exam: Appears calm and comfortable  resfused further exam Data Reviewed: I have personally reviewed following labs and imaging studies  CBC: No results for input(s): WBC, NEUTROABS, HGB, HCT, MCV, PLT in the last 168 hours. Basic Metabolic Panel: No results for input(s): NA, K, CL, CO2, GLUCOSE, BUN, CREATININE, CALCIUM, MG, PHOS in the last 168 hours. GFR: Estimated Creatinine Clearance: 54.3 mL/min (by C-G formula based on SCr of 1.23 mg/dL). Liver Function Tests: No results for input(s): AST, ALT, ALKPHOS, BILITOT, PROT, ALBUMIN in the last 168 hours. No results for input(s): LIPASE, AMYLASE in the last 168 hours. No results for input(s): AMMONIA in the last 168 hours. Coagulation Profile: No results for input(s): INR, PROTIME in the last 168 hours. Cardiac Enzymes: No results for input(s): CKTOTAL, CKMB, CKMBINDEX, TROPONINI in the last 168 hours. BNP (last 3 results) No results for input(s): PROBNP in the last 8760 hours. HbA1C: No results for input(s): HGBA1C in the last 72 hours. CBG: Recent Labs  Lab 01/26/20 0813 01/26/20 1204 01/26/20 1606 01/26/20 1944 01/27/20 0028  GLUCAP 92 152* 89 105* 180*   Lipid Profile: No results for input(s): CHOL, HDL, LDLCALC, TRIG, CHOLHDL, LDLDIRECT in the last 72 hours. Thyroid Function Tests: No results for input(s): TSH, T4TOTAL, FREET4, T3FREE, THYROIDAB in the last 72 hours. Anemia Panel: No results for input(s): VITAMINB12, FOLATE, FERRITIN, TIBC, IRON, RETICCTPCT in the last 72 hours. Sepsis Labs: No results for input(s): PROCALCITON, LATICACIDVEN in the last 168 hours.  No results found for this or any previous visit (from the past 240 hour(s)).       Radiology Studies: No results found.      Scheduled Meds: . amLODipine  10 mg Per Tube Daily  . chlorhexidine  15 mL Mouth Rinse BID  . cholecalciferol  1,000 Units Oral Daily  . dextrose  1 Tube Oral Once  . dextrose  25 g  Intravenous Once  . enoxaparin (LOVENOX) injection  40 mg Subcutaneous Daily  . feeding supplement (JEVITY 1.2 CAL)  474 mL Per Tube 4x daily  . feeding supplement (PRO-STAT SUGAR FREE 64)  30 mL Per Tube TID  . folic acid  1 mg Per Tube Daily  . free water  300 mL Per Tube Q4H  . hydrALAZINE  25 mg Per Tube Q8H  . insulin aspart  0-15 Units Subcutaneous Q4H  . levETIRAcetam  500 mg Per Tube BID  . mouth rinse  15 mL Mouth Rinse q12n4p  . metoprolol tartrate  25 mg Oral BID  . multivitamin  15 mL Per Tube Daily  . QUEtiapine  50 mg Per Tube QHS  . thiamine  100 mg Per Tube Daily   Continuous Infusions:   LOS: 141 days     Georgette Shell, MD [facility]  If 7PM-7AM, please contact night-coverage [email or web address] Password [facility] 01/28/2020, 12:06 PM

## 2020-01-29 DIAGNOSIS — I611 Nontraumatic intracerebral hemorrhage in hemisphere, cortical: Secondary | ICD-10-CM | POA: Diagnosis not present

## 2020-01-29 DIAGNOSIS — J9601 Acute respiratory failure with hypoxia: Secondary | ICD-10-CM | POA: Diagnosis not present

## 2020-01-29 LAB — GLUCOSE, CAPILLARY
Glucose-Capillary: 103 mg/dL — ABNORMAL HIGH (ref 70–99)
Glucose-Capillary: 115 mg/dL — ABNORMAL HIGH (ref 70–99)
Glucose-Capillary: 117 mg/dL — ABNORMAL HIGH (ref 70–99)
Glucose-Capillary: 117 mg/dL — ABNORMAL HIGH (ref 70–99)
Glucose-Capillary: 119 mg/dL — ABNORMAL HIGH (ref 70–99)
Glucose-Capillary: 187 mg/dL — ABNORMAL HIGH (ref 70–99)
Glucose-Capillary: 208 mg/dL — ABNORMAL HIGH (ref 70–99)
Glucose-Capillary: 75 mg/dL (ref 70–99)
Glucose-Capillary: 87 mg/dL (ref 70–99)
Glucose-Capillary: 88 mg/dL (ref 70–99)
Glucose-Capillary: 91 mg/dL (ref 70–99)
Glucose-Capillary: 104 mg/dL — ABNORMAL HIGH (ref 70–99)
Glucose-Capillary: 112 mg/dL — ABNORMAL HIGH (ref 70–99)
Glucose-Capillary: 92 mg/dL (ref 70–99)
Glucose-Capillary: 110 mg/dL — ABNORMAL HIGH (ref 70–99)
Glucose-Capillary: 135 mg/dL — ABNORMAL HIGH (ref 70–99)
Glucose-Capillary: 84 mg/dL (ref 70–99)

## 2020-01-29 NOTE — Progress Notes (Signed)
PROGRESS NOTE    Joshua [name]  [id number] DOB: Jan 04, 1981 DOA: 09/09/2019 PCP: Patient, [name]  Brief Narrative:39 year old male admitted by neurosurgery on 09/09/2019 for seizure, right hemiplegia and decreased responsiveness, found to have left hemispheric intracranial hemorrhage, intubated and admitted to ICU. Underwent craniotomy for decompression on 09/09/2019 by Dr. Vertell Limber, patient showed slow neurological recovery and prolonged hospitalization due to seizure and aspiration pneumonia. Patient continued to be awake but significant dysphagia, PEG tube was placed on 09/25/2019. Placement has been challenging as patient is uninsured and needs long-term care. Patient vomited tube feeding contents on the night of 01/06/2019. CT abdomen was negative for obstruction. Currently patient is tolerating tube feedings. Awaiting placement. 01/29/2020 no new issues reported by the staff patient remains refusing care and examination.  Assessment & Plan:   Principal Problem:   ICH (intracerebral hemorrhage) (HCC) Active Problems:   Subdural hematoma (HCC)   Intracerebral hemorrhage (HCC)   Cytotoxic brain edema (HCC)   Hemorrhagic stroke (HCC)   Acute respiratory failure ([place])   Essential hypertension   Severe protein-calorie malnutrition (HCC)   CKD (chronic kidney disease), stage IV (HCC)   Seizure (HCC)   AKI (acute kidney injury) ([place])   1 left intracranial hemorrhage likely secondary to uncontrolled hypertension status post decompressive craniectomy and hematoma evacuation 09/09/2019. CT head no vascular malformation. Dr. Vertell Limber did not recommend replacement of the skull bone with lack of progression. Patient remains aphasic with right hemiplegia and severe cognitive impairment. He continues to refuse care and exam.  #2 seizure disorder on Keppra. Patient resists and refuses care and medications.  #3 status post Klebsiella UTI treated with cefazolin.  #4  hypoglycemia-resolved  CBG (last 3)  Recent Labs    01/28/20 2324 01/29/20 0418 01/29/20 0817  GLUCAP 187* 88 91    #5 hypertensionpressure-124/95 pulse of 114.continue  metoprolol to 25 twice daily. Continue amlodipine 10 mg daily and hydralazine 25 mg every 8.   #6 status post aspiration pneumonia completed course of IV antibiotics now with PEG tube in place.   #7 status post PEG tube continue tube feedings.  #8 AKI on CKD stage III resolved.  Nutrition Problem: Severe Malnutrition Etiology: acute illness(intracerebral hemorrage)     Signs/Symptoms: severe fat depletion, severe muscle depletion    Interventions: Tube feeding, Refer to RD note for recommendations  Estimated body mass index is 15.66 kg/m as calculated from the following:   Height as of this encounter: 5\' 9"  (1.753 m).   Weight as of this encounter: 48.1 kg. DVT prophylaxis:Lovenox Code Status:Full code Family Communication:Discussed with [name] disposition Plan:Await SNF placement   Consultants:  Neurosurgery  Procedures:Decompressive craniectomy and hematoma evacuation and implantation of skull flap in the right abdomen  Antimicrobials:none  Subjective: When I tried to feel his belly he pushed my hand out when I tried to listen to his lungs he pushed my handout he appears comfortable resting in bed Objective: Vitals:   01/28/20 2004 01/28/20 2353 01/29/20 0345 01/29/20 0815  BP: (!) 139/99 123/87 111/83 128/88  Pulse: (!) 102 (!) 105 87 96  Resp: 16 16  16   Temp: 98.2 F (36.8 C) 98 F (36.7 C) 97.7 F (36.5 C) 97.6 F (36.4 C)  TempSrc: Oral Oral Axillary Oral  SpO2: 100% 97% 100% 100%  Weight:      Height:       No intake or output data in the 24 hours ending 01/29/20 1204 Filed Weights   01/21/20 0500 01/22/20 0500  01/23/20 0500  Weight: 55 kg 55 kg 48.1 kg    Examination:  General exam: Appears calm and comfortable  Respiratory system: Clear  to auscultation. Respiratory effort normal. Cardiovascular system: S1 & S2 heard, RRR. No JVD, murmurs, rubs, gallops or clicks. No pedal edema. Gastrointestinal system: Abdomen is nondistended, soft and nontender. No organomegaly or masses felt. Normal bowel sounds heard. Central nervous system: Alert and oriented. No focal neurological deficits. Extremities: Symmetric 5 x 5 power. Skin: No rashes, lesions or ulcers Psychiatry: Judgement and insight appear normal. Mood & affect appropriate.     Data Reviewed: I have personally reviewed following labs and imaging studies  CBC: No results for input(s): WBC, NEUTROABS, HGB, HCT, MCV, PLT in the last 168 hours. Basic Metabolic Panel: No results for input(s): NA, K, CL, CO2, GLUCOSE, BUN, CREATININE, CALCIUM, MG, PHOS in the last 168 hours. GFR: Estimated Creatinine Clearance: 54.3 mL/min (by C-G formula based on SCr of 1.23 mg/dL). Liver Function Tests: No results for input(s): AST, ALT, ALKPHOS, BILITOT, PROT, ALBUMIN in the last 168 hours. No results for input(s): LIPASE, AMYLASE in the last 168 hours. No results for input(s): AMMONIA in the last 168 hours. Coagulation Profile: No results for input(s): INR, PROTIME in the last 168 hours. Cardiac Enzymes: No results for input(s): CKTOTAL, CKMB, CKMBINDEX, TROPONINI in the last 168 hours. BNP (last 3 results) No results for input(s): PROBNP in the last 8760 hours. HbA1C: No results for input(s): HGBA1C in the last 72 hours. CBG: Recent Labs  Lab 01/28/20 1549 01/28/20 2013 01/28/20 2324 01/29/20 0418 01/29/20 0817  GLUCAP 112* 103* 187* 88 91   Lipid Profile: No results for input(s): CHOL, HDL, LDLCALC, TRIG, CHOLHDL, LDLDIRECT in the last 72 hours. Thyroid Function Tests: No results for input(s): TSH, T4TOTAL, FREET4, T3FREE, THYROIDAB in the last 72 hours. Anemia Panel: No results for input(s): VITAMINB12, FOLATE, FERRITIN, TIBC, IRON, RETICCTPCT in the last 72  hours. Sepsis Labs: No results for input(s): PROCALCITON, LATICACIDVEN in the last 168 hours.  No results found for this or any previous visit (from the past 240 hour(s)).       Radiology Studies: No results found.      Scheduled Meds: . amLODipine  10 mg Per Tube Daily  . chlorhexidine  15 mL Mouth Rinse BID  . cholecalciferol  1,000 Units Oral Daily  . dextrose  1 Tube Oral Once  . dextrose  25 g Intravenous Once  . enoxaparin (LOVENOX) injection  40 mg Subcutaneous Daily  . feeding supplement (JEVITY 1.2 CAL)  474 mL Per Tube 4x daily  . feeding supplement (PRO-STAT SUGAR FREE 64)  30 mL Per Tube TID  . folic acid  1 mg Per Tube Daily  . free water  300 mL Per Tube Q4H  . hydrALAZINE  25 mg Per Tube Q8H  . insulin aspart  0-15 Units Subcutaneous Q4H  . levETIRAcetam  500 mg Per Tube BID  . mouth rinse  15 mL Mouth Rinse q12n4p  . metoprolol tartrate  25 mg Oral BID  . multivitamin  15 mL Per Tube Daily  . QUEtiapine  50 mg Per Tube QHS  . thiamine  100 mg Per Tube Daily   Continuous Infusions:   LOS: 142 days     Georgette Shell, MD [facility]  If 7PM-7AM, please contact night-coverage [email or web address] Password [facility] 01/29/2020, 12:04 PM

## 2020-01-30 DIAGNOSIS — I611 Nontraumatic intracerebral hemorrhage in hemisphere, cortical: Secondary | ICD-10-CM | POA: Diagnosis not present

## 2020-01-30 LAB — GLUCOSE, CAPILLARY
Glucose-Capillary: 111 mg/dL — ABNORMAL HIGH (ref 70–99)
Glucose-Capillary: 142 mg/dL — ABNORMAL HIGH (ref 70–99)
Glucose-Capillary: 151 mg/dL — ABNORMAL HIGH (ref 70–99)
Glucose-Capillary: 71 mg/dL (ref 70–99)
Glucose-Capillary: 84 mg/dL (ref 70–99)
Glucose-Capillary: 91 mg/dL (ref 70–99)
Glucose-Capillary: 93 mg/dL (ref 70–99)

## 2020-01-30 NOTE — Progress Notes (Signed)
PROGRESS NOTE    Joshua [name]  [id number] DOB: 01-Feb-1981 DOA: 09/09/2019 PCP: Patient, [name]  Brief Narrative: 39 year old male admitted by neurosurgery on 09/09/2019 for seizure, right hemiplegia and decreased responsiveness, found to have left hemispheric intracranial hemorrhage, intubated and admitted to ICU. Underwent craniotomy for decompression on 09/09/2019 by Dr. Vertell Limber, patient showed slow neurological recovery and prolonged hospitalization due to seizure and aspiration pneumonia. Patient continued to be awake but significant dysphagia, PEG tube was placed on 09/25/2019. Placement has been challenging as patient is uninsured and needs long-term care. Patient vomited tube feeding contents on the night of 01/06/2019. CT abdomen was negative for obstruction. Currently patient is tolerating tube feedings. Awaiting placement.  Assessment & Plan:   Principal Problem:   ICH (intracerebral hemorrhage) (HCC) Active Problems:   Subdural hematoma (HCC)   Intracerebral hemorrhage (HCC)   Cytotoxic brain edema (HCC)   Hemorrhagic stroke (HCC)   Acute respiratory failure ([place])   Essential hypertension   Severe protein-calorie malnutrition (HCC)   CKD (chronic kidney disease), stage IV (HCC)   Seizure (HCC)   AKI (acute kidney injury) ([place])     1 left intracranial hemorrhage likely secondary to uncontrolled hypertension status post decompressive craniectomy and hematoma evacuation 09/09/2019. CT head no vascular malformation. Dr. Vertell Limber did not recommend replacement of the skull bone with lack of progression. Patient remains aphasic with right hemiplegia and severe cognitive impairment. He continues to refuse care and exam.  #2 seizure disorder on Keppra. Patient resists and refuses care and medications.  #3 status post Klebsiella UTI treated with cefazolin.  #4 hypoglycemia-resolved  CBG (last 3)  Recent Labs (last 2 labs)        Recent Labs    01/28/20 2324  01/29/20 0418 01/29/20 0817  GLUCAP 187* 88 91      #5 hypertensionpressure-124/95pulse of 114.continuemetoprolol to 25 twice daily. Continue amlodipine 10 mg daily and hydralazine 25 mg every 8.   #6 status post aspiration pneumonia completed course of IV antibiotics now with PEG tube in place.   #7 status post PEG tube continue tube feedings.  #8 AKI on CKD stage III resolved.     Nutrition Problem: Severe Malnutrition Etiology: acute illness(intracerebral hemorrage)     Signs/Symptoms: severe fat depletion, severe muscle depletion    Interventions: Tube feeding, Refer to RD note for recommendations  Estimated body mass index is 15.66 kg/m as calculated from the following:   Height as of this encounter: 5\' 9"  (1.753 m).   Weight as of this encounter: 48.1 kg. DVT prophylaxis:Lovenox Code Status:Full code Family Communication:Discussed with [name] disposition Plan:Await SNF placement   Consultants:  Neurosurgery  Procedures:Decompressive craniectomy and hematoma evacuation and implantation of skull flap in the right abdomen  Antimicrobials:none   Subjective: Resting in bed in nad  Objective: Vitals:   01/30/20 0401 01/30/20 0621 01/30/20 0750 01/30/20 1132  BP: (!) 121/92 (!) 142/101 (!) 132/94 122/80  Pulse: 91  90 98  Resp: 18  20 20   Temp: (!) 97.4 F (36.3 C)  97.8 F (36.6 C) 98.4 F (36.9 C)  TempSrc: Oral  Oral Oral  SpO2: 99%  100% 100%  Weight:      Height:        Intake/Output Summary (Last 24 hours) at 01/30/2020 1322 Last data filed at 01/29/2020 2117 Gross per 24 hour  Intake 504 ml  Output --  Net 504 ml   Filed Weights   01/21/20 0500 01/22/20 0500 01/23/20 0500  Weight: 55 kg 55 kg 48.1 kg    Examination:  Refused by patient    Data Reviewed: I have personally reviewed following labs and imaging studies  CBC: No results for input(s): WBC, NEUTROABS, HGB, HCT, MCV, PLT in the last 168  hours. Basic Metabolic Panel: No results for input(s): NA, K, CL, CO2, GLUCOSE, BUN, CREATININE, CALCIUM, MG, PHOS in the last 168 hours. GFR: Estimated Creatinine Clearance: 54.3 mL/min (by C-G formula based on SCr of 1.23 mg/dL). Liver Function Tests: No results for input(s): AST, ALT, ALKPHOS, BILITOT, PROT, ALBUMIN in the last 168 hours. No results for input(s): LIPASE, AMYLASE in the last 168 hours. No results for input(s): AMMONIA in the last 168 hours. Coagulation Profile: No results for input(s): INR, PROTIME in the last 168 hours. Cardiac Enzymes: No results for input(s): CKTOTAL, CKMB, CKMBINDEX, TROPONINI in the last 168 hours. BNP (last 3 results) No results for input(s): PROBNP in the last 8760 hours. HbA1C: No results for input(s): HGBA1C in the last 72 hours. CBG: Recent Labs  Lab 01/29/20 1637 01/29/20 2014 01/30/20 0017 01/30/20 0401 01/30/20 0758  GLUCAP 135* 84 142* 84 91   Lipid Profile: No results for input(s): CHOL, HDL, LDLCALC, TRIG, CHOLHDL, LDLDIRECT in the last 72 hours. Thyroid Function Tests: No results for input(s): TSH, T4TOTAL, FREET4, T3FREE, THYROIDAB in the last 72 hours. Anemia Panel: No results for input(s): VITAMINB12, FOLATE, FERRITIN, TIBC, IRON, RETICCTPCT in the last 72 hours. Sepsis Labs: No results for input(s): PROCALCITON, LATICACIDVEN in the last 168 hours.  No results found for this or any previous visit (from the past 240 hour(s)).       Radiology Studies: No results found.      Scheduled Meds: . amLODipine  10 mg Per Tube Daily  . chlorhexidine  15 mL Mouth Rinse BID  . cholecalciferol  1,000 Units Oral Daily  . dextrose  1 Tube Oral Once  . dextrose  25 g Intravenous Once  . enoxaparin (LOVENOX) injection  40 mg Subcutaneous Daily  . feeding supplement (JEVITY 1.2 CAL)  474 mL Per Tube 4x daily  . feeding supplement (PRO-STAT SUGAR FREE 64)  30 mL Per Tube TID  . folic acid  1 mg Per Tube Daily  . free water   300 mL Per Tube Q4H  . hydrALAZINE  25 mg Per Tube Q8H  . insulin aspart  0-15 Units Subcutaneous Q4H  . levETIRAcetam  500 mg Per Tube BID  . mouth rinse  15 mL Mouth Rinse q12n4p  . metoprolol tartrate  25 mg Oral BID  . multivitamin  15 mL Per Tube Daily  . QUEtiapine  50 mg Per Tube QHS  . thiamine  100 mg Per Tube Daily   Continuous Infusions:   LOS: 143 days     Georgette Shell, MD [facility]  If 7PM-7AM, please contact night-coverage [email or web address] Password [email or web address] 01/30/2020, 1:22 PM

## 2020-01-30 NOTE — Plan of Care (Signed)
  Problem: Safety: Goal: Ability to remain free from injury will improve Outcome: Progressing   

## 2020-01-31 DIAGNOSIS — J9601 Acute respiratory failure with hypoxia: Secondary | ICD-10-CM | POA: Diagnosis not present

## 2020-01-31 DIAGNOSIS — I611 Nontraumatic intracerebral hemorrhage in hemisphere, cortical: Secondary | ICD-10-CM | POA: Diagnosis not present

## 2020-01-31 LAB — GLUCOSE, CAPILLARY
Glucose-Capillary: 86 mg/dL (ref 70–99)
Glucose-Capillary: 87 mg/dL (ref 70–99)
Glucose-Capillary: 87 mg/dL (ref 70–99)
Glucose-Capillary: 113 mg/dL — ABNORMAL HIGH (ref 70–99)
Glucose-Capillary: 94 mg/dL (ref 70–99)

## 2020-01-31 NOTE — Progress Notes (Signed)
PROGRESS NOTE    Joshua [name]  [id number] DOB: 1981-08-09 DOA: 09/09/2019 PCP: Patient, [name]   Brief Narrative: 39 year old male admitted by neurosurgery on 09/09/2019 for seizure, right hemiplegia and decreased responsiveness, found to have left hemispheric intracranial hemorrhage, intubated and admitted to ICU. Underwent craniotomy for decompression on 09/09/2019 by Dr. Vertell Limber, patient showed slow neurological recovery and prolonged hospitalization due to seizure and aspiration pneumonia. Patient continued to be awake but significant dysphagia, PEG tube was placed on 09/25/2019. Placement has been challenging as patient is uninsured and needs long-term care. Patient vomited tube feeding contents on the night of 01/06/2019. CT abdomen was negative for obstruction. Currently patient is tolerating tube feedings. Awaiting placement.  Assessment & Plan:   Principal Problem:   ICH (intracerebral hemorrhage) (HCC) Active Problems:   Subdural hematoma (HCC)   Intracerebral hemorrhage (HCC)   Cytotoxic brain edema (HCC)   Hemorrhagic stroke (HCC)   Acute respiratory failure ([place])   Essential hypertension   Severe protein-calorie malnutrition (HCC)   CKD (chronic kidney disease), stage IV (HCC)   Seizure (HCC)   AKI (acute kidney injury) ([place])  #1left intracranial hemorrhage likely secondary to uncontrolled hypertension status post decompressive craniectomy and hematoma evacuation 09/09/2019. CT head no vascular malformation. Dr. Vertell Limber did not recommend replacement of the skull bone with lack of progression. Patient remains aphasic with right hemiplegia and severe cognitive impairment. He continues to refuse care and exam.  #2 seizure disorder on Keppra. Patient resists and refuses care and medications.  No recent seizure activity.  #3 status post Klebsiella UTI treated with cefazolin.  #4 hypoglycemia-resolved CBG (last 3)  Recent Labs    01/31/20 0324 01/31/20 0738  01/31/20 1140  GLUCAP 86 87 87    #5 hypertensionpressure-124/95pulse of 114.continuemetoprolol to 25 twice daily. Continue amlodipine 10 mg daily and hydralazine 25 mg every 8.   #6 status post aspiration pneumonia completed course of IV antibiotics now with PEG tube in place.   #7 status post PEG tube continue tube feedings.  #8 AKI on CKD stage III resolved.    Nutrition Problem: Severe Malnutrition Etiology: acute illness(intracerebral hemorrage)     Signs/Symptoms: severe fat depletion, severe muscle depletion    Interventions: Tube feeding, Refer to RD note for recommendations  Estimated body mass index is 15.63 kg/m as calculated from the following:   Height as of this encounter: 5\' 9"  (1.753 m).   Weight as of this encounter: 48 kg.  DVT prophylaxis:Lovenox Code Status:Full code Family Communication:Discussed with [name] disposition Plan:Await SNF placement   Consultants:  Neurosurgery  Procedures:Decompressive craniectomy and hematoma evacuation and implantation of skull flap in the right abdomen  Antimicrobials:none   Subjective: No changes patient resting in bed no events overnight patient continues not to eat anything by mouth and continues to refuse food and care and medications  Objective: Vitals:   01/31/20 0235 01/31/20 0356 01/31/20 0736 01/31/20 1138  BP:  95/75 (!) 132/104 (!) 122/96  Pulse:  99 93 89  Resp:  20 20 20   Temp:  98.9 F (37.2 C) 98 F (36.7 C) 98.4 F (36.9 C)  TempSrc:  Oral Oral Oral  SpO2:  100% 100% 99%  Weight: 48 kg     Height:        Intake/Output Summary (Last 24 hours) at 01/31/2020 1439 Last data filed at 01/31/2020 1000 Gross per 24 hour  Intake 0 ml  Output --  Net 0 ml   [employer]  01/22/20 0500 01/23/20 0500 01/31/20 0235  Weight: 55 kg 48.1 kg 48 kg    Examination:  Refuses exam.  Appears in no acute distress.  Data Reviewed: I have personally reviewed  following labs and imaging studies  CBC: No results for input(s): WBC, NEUTROABS, HGB, HCT, MCV, PLT in the last 168 hours. Basic Metabolic Panel: No results for input(s): NA, K, CL, CO2, GLUCOSE, BUN, CREATININE, CALCIUM, MG, PHOS in the last 168 hours. GFR: Estimated Creatinine Clearance: 54.2 mL/min (by C-G formula based on SCr of 1.23 mg/dL). Liver Function Tests: No results for input(s): AST, ALT, ALKPHOS, BILITOT, PROT, ALBUMIN in the last 168 hours. No results for input(s): LIPASE, AMYLASE in the last 168 hours. No results for input(s): AMMONIA in the last 168 hours. Coagulation Profile: No results for input(s): INR, PROTIME in the last 168 hours. Cardiac Enzymes: No results for input(s): CKTOTAL, CKMB, CKMBINDEX, TROPONINI in the last 168 hours. BNP (last 3 results) No results for input(s): PROBNP in the last 8760 hours. HbA1C: No results for input(s): HGBA1C in the last 72 hours. CBG: Recent Labs  Lab 01/30/20 2057 01/30/20 2347 01/31/20 0324 01/31/20 0738 01/31/20 1140  GLUCAP 93 151* 86 87 87   Lipid Profile: No results for input(s): CHOL, HDL, LDLCALC, TRIG, CHOLHDL, LDLDIRECT in the last 72 hours. Thyroid Function Tests: No results for input(s): TSH, T4TOTAL, FREET4, T3FREE, THYROIDAB in the last 72 hours. Anemia Panel: No results for input(s): VITAMINB12, FOLATE, FERRITIN, TIBC, IRON, RETICCTPCT in the last 72 hours. Sepsis Labs: No results for input(s): PROCALCITON, LATICACIDVEN in the last 168 hours.  No results found for this or any previous visit (from the past 240 hour(s)).       Radiology Studies: No results found.      Scheduled Meds: . amLODipine  10 mg Per Tube Daily  . chlorhexidine  15 mL Mouth Rinse BID  . cholecalciferol  1,000 Units Oral Daily  . dextrose  1 Tube Oral Once  . dextrose  25 g Intravenous Once  . enoxaparin (LOVENOX) injection  40 mg Subcutaneous Daily  . feeding supplement (JEVITY 1.2 CAL)  474 mL Per Tube 4x daily    . feeding supplement (PRO-STAT SUGAR FREE 64)  30 mL Per Tube TID  . folic acid  1 mg Per Tube Daily  . free water  300 mL Per Tube Q4H  . hydrALAZINE  25 mg Per Tube Q8H  . insulin aspart  0-15 Units Subcutaneous Q4H  . levETIRAcetam  500 mg Per Tube BID  . mouth rinse  15 mL Mouth Rinse q12n4p  . metoprolol tartrate  25 mg Oral BID  . multivitamin  15 mL Per Tube Daily  . QUEtiapine  50 mg Per Tube QHS  . thiamine  100 mg Per Tube Daily   Continuous Infusions:   LOS: 144 days     Georgette Shell, MD [facility]  If 7PM-7AM, please contact night-coverage [email or web address] Password [email or web address] 01/31/2020, 2:39 PM

## 2020-01-31 NOTE — Progress Notes (Signed)
Nutrition Follow-up  RD working remotely.   DOCUMENTATION CODES:   Severe malnutrition in context of acute illness/injury  INTERVENTION:  Continue Jevity 1.2 formula via PEG at goal volume of 474 ml (2 cartons/ARCs) given QID.   Provide 30 ml ProstatTID per tube.   Free water flushes of 300 ml every 4 hours per tube. (MD to adjust as appropriate)  Tube feeding to provide 2575 kcal, 150grams of protein, and 3336 ml free water.  Continue PO intake for comfort as appropriate/tolerated.   NUTRITION DIAGNOSIS:   Severe Malnutrition related to acute illness(intracerebral hemorrage) as evidenced by severe fat depletion, severe muscle depletion.  Ongoing.   GOAL:   Patient will meet greater than or equal to 90% of their needs  Met with tube feeding.   MONITOR:   TF tolerance, Skin, Weight trends, Labs, I & O'Joshua [name]  REASON FOR ASSESSMENT:   Ventilator, Consult Enteral/tube feeding initiation and management  ASSESSMENT:   39 yo male admitted with large left intracranial hemorrhage; Joshua [name]/Joshua Joshua [name]. PMH includes HTN, alcoholism, noncompliance.  9/12left frontal temporal craniectomy and evacuation hematoma.  9/23 Extubated.  10/1 PEG placed 11/23- Nutrition Focused Physical Exam performed, severe subcutaneous fat loss and severe muscle wasting identified.   Unable to reach pt via phone.   Per MD, pt is tolerating tube feedings and is still awaiting placement. Per RN, pt is accepting tube feeding but has not been taking anything PO.   Medications reviewed and include: Vitamin D3, Glutose, Dextrose, 468m Jevity 1.2 cal QID, 372mPro-stat TID, Folvite, 30020mree water flushes Q4, SSI, MVI, Vitamin B1  Labs reviewed. CBGs 71-151  Pt'Joshua [name] wt stable since last RD assessment.  Diet Order:   Diet Order            Diet regular Room service appropriate? Yes; Fluid consistency: Thin  Diet effective now              EDUCATION NEEDS:   Not appropriate for education  at this time  Skin:  Skin Assessment: Reviewed RN Assessment  Last BM:  01/29/20  Height:   Ht Readings from Last 1 Encounters:  09/09/19 5' 9" (1.753 m)    Weight:   Wt Readings from Last 1 Encounters:  01/31/20 48 kg    Ideal Body Weight:  72.7 kg  BMI:  Body mass index is 15.63 kg/m.  Estimated Nutritional Needs:   Kcal:  2200-2500  Protein:  120-150 grams  Fluid:  >/= 2 L/day   AmaLarkin InaS, RD, LDN Pager: [phone]/After Hours Pager: [phone]

## 2020-02-01 DIAGNOSIS — J9601 Acute respiratory failure with hypoxia: Secondary | ICD-10-CM | POA: Diagnosis not present

## 2020-02-01 DIAGNOSIS — I611 Nontraumatic intracerebral hemorrhage in hemisphere, cortical: Secondary | ICD-10-CM | POA: Diagnosis not present

## 2020-02-01 LAB — GLUCOSE, CAPILLARY
Glucose-Capillary: 149 mg/dL — ABNORMAL HIGH (ref 70–99)
Glucose-Capillary: 174 mg/dL — ABNORMAL HIGH (ref 70–99)
Glucose-Capillary: 82 mg/dL (ref 70–99)
Glucose-Capillary: 83 mg/dL (ref 70–99)
Glucose-Capillary: 95 mg/dL (ref 70–99)
Glucose-Capillary: 95 mg/dL (ref 70–99)

## 2020-02-01 NOTE — Progress Notes (Signed)
PROGRESS NOTE    Joshua [name]  [id number] DOB: [birth year] DOA: 09/09/2019 PCP: Patient, [name]   Brief Narrative:39 year old male admitted by neurosurgery on 09/09/2019 for seizure, right hemiplegia and decreased responsiveness, found to have left hemispheric intracranial hemorrhage, intubated and admitted to ICU. Underwent craniotomy for decompression on 09/09/2019 by Dr. Vertell Limber, patient showed slow neurological recovery and prolonged hospitalization due to seizure and aspiration pneumonia. Patient continued to be awake but significant dysphagia, PEG tube was placed on 09/25/2019. Placement has been challenging as patient is uninsured and needs long-term care. Patient vomited tube feeding contents on the night of 01/06/2019. CT abdomen was negative for obstruction. Currently patient is tolerating tube feedings. Awaiting placement.   Assessment & Plan:   Principal Problem:   ICH (intracerebral hemorrhage) (HCC) Active Problems:   Subdural hematoma (HCC)   Intracerebral hemorrhage (HCC)   Cytotoxic brain edema (HCC)   Hemorrhagic stroke (HCC)   Acute respiratory failure ([place])   Essential hypertension   Severe protein-calorie malnutrition (HCC)   CKD (chronic kidney disease), stage IV (HCC)   Seizure (HCC)   AKI (acute kidney injury) ([place])  #1left intracranial hemorrhage likely secondary to uncontrolled hypertension status post decompressive craniectomy and hematoma evacuation 09/09/2019. CT head no vascular malformation. Dr. Vertell Limber did not recommend replacement of the skull bone with lack of progression. Patient remains aphasic with right hemiplegia and severe cognitive impairment. He continues to refuse care and exam.  #2 seizure disorder on Keppra. Patient resists and refuses care and medications.  No recent seizure activity.  #3 status post Klebsiella UTI treated with cefazolin.   #4 hypertensionpressure-124/95pulse of 114.continuemetoprolol to 25 twice daily.  Continue amlodipine 10 mg daily and hydralazine 25 mg every 8.   #5 status post aspiration pneumonia completed course of IV antibiotics now with PEG tube in place.   #6 status post PEG tube continue tube feedings.  #7  AKI on CKD stage III resolved.  #8 hypoglycemia resolved. CBG (last 3)  Recent Labs    02/01/20 0427 02/01/20 0804 02/01/20 1125  GLUCAP 95 95 83     Nutrition Problem: Severe Malnutrition Etiology: acute illness(intracerebral hemorrage)     Signs/Symptoms: severe fat depletion, severe muscle depletion    Interventions: Tube feeding, Refer to RD note for recommendations  Estimated body mass index is 15.53 kg/m as calculated from the following:   Height as of this encounter: 5\' 9"  (1.753 m).   Weight as of this encounter: 47.7 kg.  DVT prophylaxis:Lovenox Code Status:Full code Family Communication:Discussed with [name] disposition Plan:Await SNF placement   Consultants:  Neurosurgery  Procedures:Decompressive craniectomy and hematoma evacuation and implantation of skull flap in the right abdomen  Antimicrobials:none  Subjective: Resting in bed breakfast by the bedside Aide reports he does not eat he refuses to eat He pushes her hand away if she tries to feed him  Objective: Vitals:   02/01/20 0418 02/01/20 0422 02/01/20 0802 02/01/20 1126  BP: 108/82  112/89 (!) 135/102  Pulse: 89  93 92  Resp: 19  16 16   Temp: 98.5 F (36.9 C)  98.3 F (36.8 C) (!) 97.5 F (36.4 C)  TempSrc: Oral  Oral Oral  SpO2: 100%  100% 100%  Weight:  47.7 kg    Height:       No intake or output data in the 24 hours ending 02/01/20 1357 Filed Weights   01/23/20 0500 01/31/20 0235 02/01/20 0422  Weight: 48.1 kg 48 kg 47.7 kg  Examination: Refused by the patient   Data Reviewed: I have personally reviewed following labs and imaging studies  CBC: No results for input(s): WBC, NEUTROABS, HGB, HCT, MCV, PLT in the last 168  hours. Basic Metabolic Panel: No results for input(s): NA, K, CL, CO2, GLUCOSE, BUN, CREATININE, CALCIUM, MG, PHOS in the last 168 hours. GFR: Estimated Creatinine Clearance: 53.9 mL/min (by C-G formula based on SCr of 1.23 mg/dL). Liver Function Tests: No results for input(s): AST, ALT, ALKPHOS, BILITOT, PROT, ALBUMIN in the last 168 hours. No results for input(s): LIPASE, AMYLASE in the last 168 hours. No results for input(s): AMMONIA in the last 168 hours. Coagulation Profile: No results for input(s): INR, PROTIME in the last 168 hours. Cardiac Enzymes: No results for input(s): CKTOTAL, CKMB, CKMBINDEX, TROPONINI in the last 168 hours. BNP (last 3 results) No results for input(s): PROBNP in the last 8760 hours. HbA1C: No results for input(s): HGBA1C in the last 72 hours. CBG: Recent Labs  Lab 01/31/20 2017 02/01/20 0056 02/01/20 0427 02/01/20 0804 02/01/20 1125  GLUCAP 94 174* 95 95 83   Lipid Profile: No results for input(s): CHOL, HDL, LDLCALC, TRIG, CHOLHDL, LDLDIRECT in the last 72 hours. Thyroid Function Tests: No results for input(s): TSH, T4TOTAL, FREET4, T3FREE, THYROIDAB in the last 72 hours. Anemia Panel: No results for input(s): VITAMINB12, FOLATE, FERRITIN, TIBC, IRON, RETICCTPCT in the last 72 hours. Sepsis Labs: No results for input(s): PROCALCITON, LATICACIDVEN in the last 168 hours.  No results found for this or any previous visit (from the past 240 hour(s)).       Radiology Studies: No results found.      Scheduled Meds: . amLODipine  10 mg Per Tube Daily  . chlorhexidine  15 mL Mouth Rinse BID  . cholecalciferol  1,000 Units Oral Daily  . dextrose  1 Tube Oral Once  . dextrose  25 g Intravenous Once  . enoxaparin (LOVENOX) injection  40 mg Subcutaneous Daily  . feeding supplement (JEVITY 1.2 CAL)  474 mL Per Tube 4x daily  . feeding supplement (PRO-STAT SUGAR FREE 64)  30 mL Per Tube TID  . folic acid  1 mg Per Tube Daily  . free water   300 mL Per Tube Q4H  . hydrALAZINE  25 mg Per Tube Q8H  . insulin aspart  0-15 Units Subcutaneous Q4H  . levETIRAcetam  500 mg Per Tube BID  . mouth rinse  15 mL Mouth Rinse q12n4p  . metoprolol tartrate  25 mg Oral BID  . multivitamin  15 mL Per Tube Daily  . QUEtiapine  50 mg Per Tube QHS  . thiamine  100 mg Per Tube Daily   Continuous Infusions:   LOS: 145 days     Georgette Shell, MD [facility]  If 7PM-7AM, please contact night-coverage [email or web address] Password [email or web address] 02/01/2020, 1:57 PM

## 2020-02-02 DIAGNOSIS — J9601 Acute respiratory failure with hypoxia: Secondary | ICD-10-CM | POA: Diagnosis not present

## 2020-02-02 DIAGNOSIS — I611 Nontraumatic intracerebral hemorrhage in hemisphere, cortical: Secondary | ICD-10-CM | POA: Diagnosis not present

## 2020-02-02 LAB — GLUCOSE, CAPILLARY
Glucose-Capillary: 135 mg/dL — ABNORMAL HIGH (ref 70–99)
Glucose-Capillary: 179 mg/dL — ABNORMAL HIGH (ref 70–99)
Glucose-Capillary: 86 mg/dL (ref 70–99)
Glucose-Capillary: 87 mg/dL (ref 70–99)
Glucose-Capillary: 87 mg/dL (ref 70–99)
Glucose-Capillary: 90 mg/dL (ref 70–99)

## 2020-02-02 NOTE — Progress Notes (Signed)
PROGRESS NOTE    Joshua [name]  [id number] DOB: August 06, 1981 DOA: 09/09/2019 PCP: Patient, [name]   Brief Narrative:39 year old male admitted by neurosurgery on 09/09/2019 for seizure, right hemiplegia and decreased responsiveness, found to have left hemispheric intracranial hemorrhage, intubated and admitted to ICU. Underwent craniotomy for decompression on 09/09/2019 by Dr. Vertell Limber, patient showed slow neurological recovery and prolonged hospitalization due to seizure and aspiration pneumonia. Patient continued to be awake but significant dysphagia, PEG tube was placed on 09/25/2019. Placement has been challenging as patient is uninsured and needs long-term care. Patient vomited tube feeding contents on the night of 01/06/2019. CT abdomen was negative for obstruction. Currently patient is tolerating tube feedings. Awaiting placement  Assessment & Plan:   Principal Problem:   ICH (intracerebral hemorrhage) (HCC) Active Problems:   Subdural hematoma (HCC)   Intracerebral hemorrhage (HCC)   Cytotoxic brain edema (HCC)   Hemorrhagic stroke (HCC)   Acute respiratory failure ([place])   Essential hypertension   Severe protein-calorie malnutrition (HCC)   CKD (chronic kidney disease), stage IV (HCC)   Seizure (HCC)   AKI (acute kidney injury) ([place])   #1left intracranial hemorrhage likely secondary to uncontrolled hypertension status post decompressive craniectomy and hematoma evacuation 09/09/2019. CT head no vascular malformation. Dr. Vertell Limber did not recommend replacement of the skull bone with lack of progression. Patient remains aphasic with right hemiplegia and severe cognitive impairment. He continues to refuse care and exam.  #2 seizure disorder on Keppra. Patient resists and refuses care and medications.No recent seizure activity.  #3 status post Klebsiella UTI treated with cefazolin.   #4 hypertensionpressure-124/95pulse of 114.continuemetoprolol to 25 twice daily.  Continue amlodipine 10 mg daily and hydralazine 25 mg every 8.   #5 status post aspiration pneumonia completed course of IV antibiotics now with PEG tube in place.   #6 status post PEG tube continue tube feedings.  #7  AKI on CKD stage III resolved.  #8 hypoglycemia resolved.      Nutrition Problem: Severe Malnutrition Etiology: acute illness(intracerebral hemorrage)     Signs/Symptoms: severe fat depletion, severe muscle depletion    Interventions: Tube feeding, Refer to RD note for recommendations  Estimated body mass index is 15.46 kg/m as calculated from the following:   Height as of this encounter: 5\' 9"  (1.753 m).   Weight as of this encounter: 47.5 kg. DVT prophylaxis:Lovenox Code Status:Full code Family Communication:Discussed with [name] disposition Plan:Await SNF placement   Consultants:  Neurosurgery  Procedures:Decompressive craniectomy and hematoma evacuation and implantation of skull flap in the right abdomen  Antimicrobials:none  Subjective: Patient is resting in bed staff reports no events overnight patient continues to push hands away when trying to examine him or feed him.  Objective: Vitals:   02/02/20 0748 02/02/20 1128 02/02/20 1416 02/02/20 1611  BP: (!) 122/99 124/87 (!) 138/106 (!) 136/99  Pulse: 91 97  (!) 110  Resp: 16 17  18   Temp: (!) 97.5 F (36.4 C) 97.7 F (36.5 C)  98.2 F (36.8 C)  TempSrc: Oral Oral  Oral  SpO2: 100% 100%  100%  Weight:      Height:       No intake or output data in the 24 hours ending 02/02/20 1624 Filed Weights   01/31/20 0235 02/01/20 0422 02/02/20 0344  Weight: 48 kg 47.7 kg 47.5 kg    Examination:  General exam: Appears calm and comfortable  Refuses to be examined    Data Reviewed: I have personally reviewed following  labs and imaging studies  CBC: No results for input(s): WBC, NEUTROABS, HGB, HCT, MCV, PLT in the last 168 hours. Basic Metabolic Panel: No  results for input(s): NA, K, CL, CO2, GLUCOSE, BUN, CREATININE, CALCIUM, MG, PHOS in the last 168 hours. GFR: Estimated Creatinine Clearance: 53.6 mL/min (by C-G formula based on SCr of 1.23 mg/dL). Liver Function Tests: No results for input(s): AST, ALT, ALKPHOS, BILITOT, PROT, ALBUMIN in the last 168 hours. No results for input(s): LIPASE, AMYLASE in the last 168 hours. No results for input(s): AMMONIA in the last 168 hours. Coagulation Profile: No results for input(s): INR, PROTIME in the last 168 hours. Cardiac Enzymes: No results for input(s): CKTOTAL, CKMB, CKMBINDEX, TROPONINI in the last 168 hours. BNP (last 3 results) No results for input(s): PROBNP in the last 8760 hours. HbA1C: No results for input(s): HGBA1C in the last 72 hours. CBG: Recent Labs  Lab 02/01/20 2323 02/02/20 0343 02/02/20 0744 02/02/20 1144 02/02/20 1605  GLUCAP 149* 86 90 87 179*   Lipid Profile: No results for input(s): CHOL, HDL, LDLCALC, TRIG, CHOLHDL, LDLDIRECT in the last 72 hours. Thyroid Function Tests: No results for input(s): TSH, T4TOTAL, FREET4, T3FREE, THYROIDAB in the last 72 hours. Anemia Panel: No results for input(s): VITAMINB12, FOLATE, FERRITIN, TIBC, IRON, RETICCTPCT in the last 72 hours. Sepsis Labs: No results for input(s): PROCALCITON, LATICACIDVEN in the last 168 hours.  No results found for this or any previous visit (from the past 240 hour(s)).       Radiology Studies: No results found.      Scheduled Meds: . amLODipine  10 mg Per Tube Daily  . chlorhexidine  15 mL Mouth Rinse BID  . cholecalciferol  1,000 Units Oral Daily  . dextrose  1 Tube Oral Once  . dextrose  25 g Intravenous Once  . enoxaparin (LOVENOX) injection  40 mg Subcutaneous Daily  . feeding supplement (JEVITY 1.2 CAL)  474 mL Per Tube 4x daily  . feeding supplement (PRO-STAT SUGAR FREE 64)  30 mL Per Tube TID  . folic acid  1 mg Per Tube Daily  . free water  300 mL Per Tube Q4H  .  hydrALAZINE  25 mg Per Tube Q8H  . insulin aspart  0-15 Units Subcutaneous Q4H  . levETIRAcetam  500 mg Per Tube BID  . mouth rinse  15 mL Mouth Rinse q12n4p  . metoprolol tartrate  25 mg Oral BID  . multivitamin  15 mL Per Tube Daily  . QUEtiapine  50 mg Per Tube QHS  . thiamine  100 mg Per Tube Daily   Continuous Infusions:   LOS: 146 days     Georgette Shell, MD 02/02/2020, 4:24 PM

## 2020-02-03 DIAGNOSIS — J9601 Acute respiratory failure with hypoxia: Secondary | ICD-10-CM | POA: Diagnosis not present

## 2020-02-03 DIAGNOSIS — G936 Cerebral edema: Secondary | ICD-10-CM | POA: Diagnosis not present

## 2020-02-03 DIAGNOSIS — I611 Nontraumatic intracerebral hemorrhage in hemisphere, cortical: Secondary | ICD-10-CM | POA: Diagnosis not present

## 2020-02-03 NOTE — Progress Notes (Signed)
PROGRESS NOTE    Joshua [name]  [id number] DOB: 09-25-1981 DOA: 09/09/2019 PCP: Patient, [name]    Brief Narrative: :39 year old male admitted by neurosurgery on 09/09/2019 for seizure, right hemiplegia and decreased responsiveness, found to have left hemispheric intracranial hemorrhage, intubated and admitted to ICU. Underwent craniotomy for decompression on 09/09/2019 by Dr. Vertell Limber, patient showed slow neurological recovery and prolonged hospitalization due to seizure and aspiration pneumonia. Patient continued to be awake but significant dysphagia, PEG tube was placed on 09/25/2019. Placement has been challenging as patient is uninsured and needs long-term care. Patient vomited tube feeding contents on the night of 01/06/2019. CT abdomen was negative for obstruction. Currently patient is tolerating tube feedings. Awaiting placement  Assessment & Plan:   Principal Problem:   ICH (intracerebral hemorrhage) (HCC) Active Problems:   Subdural hematoma (HCC)   Intracerebral hemorrhage (HCC)   Cytotoxic brain edema (HCC)   Hemorrhagic stroke (HCC)   Acute respiratory failure ([place])   Essential hypertension   Severe protein-calorie malnutrition (HCC)   CKD (chronic kidney disease), stage IV (HCC)   Seizure (HCC)   AKI (acute kidney injury) ([place])  #1left intracranial hemorrhage likely secondary to uncontrolled hypertension status post decompressive craniectomy and hematoma evacuation 09/09/2019. CT head no vascular malformation. Dr. Vertell Limber did not recommend replacement of the skull bone with lack of progression. Patient remains aphasic with right hemiplegia and severe cognitive impairment. He continues to refuse care and exam.  #2 seizure disorder on Keppra. Patient resists and refuses care and medications.No recent seizure activity.  #3 status post Klebsiella UTI treated with cefazolin.   #4hypertensionpressure-124/95pulse of 114.continuemetoprolol to 25 twice daily.  Continue amlodipine 10 mg daily and hydralazine 25 mg every 8.   #5status post aspiration pneumonia completed course of IV antibiotics now with PEG tube in place.   #6status post PEG tube continue tube feedings.  #7AKI on CKD stage III resolved.  #8 hypoglycemia resolved.        Nutrition Problem: Severe Malnutrition Etiology: acute illness(intracerebral hemorrage)     Signs/Symptoms: severe fat depletion, severe muscle depletion    Interventions: Tube feeding, Refer to RD note for recommendations  Estimated body mass index is 14.98 kg/m as calculated from the following:   Height as of this encounter: 5\' 9"  (1.753 m).   Weight as of this encounter: 46 kg.  DVT prophylaxis:Lovenox Code Status:Full code Family Communication:Discussed with [name] disposition Plan:Await SNF placement   Consultants:  Neurosurgery  Procedures:Decompressive craniectomy and hematoma evacuation and implantation of skull flap in the right abdomen  Antimicrobials:none   Subjective:  Resting in bed in no acute distress patient continues to refuse eating or care her to examine him.  He pushes my hand with his left hand. Objective: Vitals:   02/03/20 0348 02/03/20 0500 02/03/20 0816 02/03/20 1141  BP: (!) 142/95  (!) 152/114 (!) 120/95  Pulse: 82  94 88  Resp: 20  20 20   Temp: (!) 97.5 F (36.4 C)  98.3 F (36.8 C) 98 F (36.7 C)  TempSrc: Oral  Oral Oral  SpO2: 100%  100% 100%  Weight:  46 kg    Height:       No intake or output data in the 24 hours ending 02/03/20 1441 Filed Weights   02/01/20 0422 02/02/20 0344 02/03/20 0500  Weight: 47.7 kg 47.5 kg 46 kg    Examination:  General exam: Appears calm and comfortable    Data Reviewed: I have personally reviewed following labs and  imaging studies  CBC: No results for input(s): WBC, NEUTROABS, HGB, HCT, MCV, PLT in the last 168 hours. Basic Metabolic Panel: No results for input(s): NA, K,  CL, CO2, GLUCOSE, BUN, CREATININE, CALCIUM, MG, PHOS in the last 168 hours. GFR: Estimated Creatinine Clearance: 51.9 mL/min (by C-G formula based on SCr of 1.23 mg/dL). Liver Function Tests: No results for input(s): AST, ALT, ALKPHOS, BILITOT, PROT, ALBUMIN in the last 168 hours. No results for input(s): LIPASE, AMYLASE in the last 168 hours. No results for input(s): AMMONIA in the last 168 hours. Coagulation Profile: No results for input(s): INR, PROTIME in the last 168 hours. Cardiac Enzymes: No results for input(s): CKTOTAL, CKMB, CKMBINDEX, TROPONINI in the last 168 hours. BNP (last 3 results) No results for input(s): PROBNP in the last 8760 hours. HbA1C: No results for input(s): HGBA1C in the last 72 hours. CBG: Recent Labs  Lab 02/02/20 0744 02/02/20 1144 02/02/20 1605 02/02/20 2003 02/02/20 2342  GLUCAP 90 87 179* 87 135*   Lipid Profile: No results for input(s): CHOL, HDL, LDLCALC, TRIG, CHOLHDL, LDLDIRECT in the last 72 hours. Thyroid Function Tests: No results for input(s): TSH, T4TOTAL, FREET4, T3FREE, THYROIDAB in the last 72 hours. Anemia Panel: No results for input(s): VITAMINB12, FOLATE, FERRITIN, TIBC, IRON, RETICCTPCT in the last 72 hours. Sepsis Labs: No results for input(s): PROCALCITON, LATICACIDVEN in the last 168 hours.  No results found for this or any previous visit (from the past 240 hour(s)).       Radiology Studies: No results found.      Scheduled Meds: . amLODipine  10 mg Per Tube Daily  . chlorhexidine  15 mL Mouth Rinse BID  . cholecalciferol  1,000 Units Oral Daily  . dextrose  1 Tube Oral Once  . dextrose  25 g Intravenous Once  . enoxaparin (LOVENOX) injection  40 mg Subcutaneous Daily  . feeding supplement (JEVITY 1.2 CAL)  474 mL Per Tube 4x daily  . feeding supplement (PRO-STAT SUGAR FREE 64)  30 mL Per Tube TID  . folic acid  1 mg Per Tube Daily  . free water  300 mL Per Tube Q4H  . hydrALAZINE  25 mg Per Tube Q8H  .  insulin aspart  0-15 Units Subcutaneous Q4H  . levETIRAcetam  500 mg Per Tube BID  . mouth rinse  15 mL Mouth Rinse q12n4p  . metoprolol tartrate  25 mg Oral BID  . multivitamin  15 mL Per Tube Daily  . QUEtiapine  50 mg Per Tube QHS  . thiamine  100 mg Per Tube Daily   Continuous Infusions:   LOS: 147 days     Georgette Shell, MD  02/03/2020, 2:41 PM

## 2020-02-04 DIAGNOSIS — I611 Nontraumatic intracerebral hemorrhage in hemisphere, cortical: Secondary | ICD-10-CM | POA: Diagnosis not present

## 2020-02-04 NOTE — Progress Notes (Signed)
Patient lunchtime CBG 69 mg/dL. Patient recieved scheduled tube feed. CBG rechecked 139 mg/dL. Gwendolyn Grant, RN

## 2020-02-04 NOTE — Progress Notes (Signed)
PROGRESS NOTE    Joshua [name]  [id number] DOB: 12-27-1981 DOA: 09/09/2019 PCP: Patient, [name]    Brief Narrative:  Patient is a 39 year old African-American male admitted by neurosurgery on 09/09/2019 for seizure, right hemiplegia and decreased responsiveness, found to have left hemispheric intracranial hemorrhage, intubated and admitted to ICU.  Patientunderwent craniotomy for decompression on 09/09/2019 by Dr. Vertell Limber, patient showed slow neurological recovery and prolonged hospitalization due to seizure and aspiration pneumonia. Patient continued to be awake but significant dysphagia, PEG tube was placed on 09/25/2019. Placement has been challenging as patient is uninsured and needs long-term care. Patient vomited tube feeding contents on the night of 01/06/2019. CT abdomen was negative for obstruction. Currently patient is tolerating tube feedings. Awaiting placement.  02/04/2020: Patient seen.  No new changes.  Assessment & Plan:   Principal Problem:   ICH (intracerebral hemorrhage) (HCC) Active Problems:   Subdural hematoma (HCC)   Intracerebral hemorrhage (HCC)   Cytotoxic brain edema (HCC)   Hemorrhagic stroke (HCC)   Acute respiratory failure ([place])   Essential hypertension   Severe protein-calorie malnutrition (HCC)   CKD (chronic kidney disease), stage IV (HCC)   Seizure (HCC)   AKI (acute kidney injury) ([place])  Left intracranial hemorrhage: -Likely secondary to uncontrolled hypertension -Patient is status post decompressive craniectomy and hematoma evacuation 09/09/2019. -CT head no vascular malformation. Dr. Vertell Limber did not recommend replacement of the skull bone with lack of progression. -Patient remains aphasic with right hemiplegia and severe cognitive impairment. -Patient continues to refuse care and exam.  Seizure disorder: -On Keppra. -Patient resists and refuses care and medications. -No recent seizure activity.  Klebsiella UTI: -Treated with  cefazolin.  Hypertension: -Blood pressures currently controlled. Continue Norvasc, hydralazine and metoprolol.  Aspiration pneumonia: -Patient has completed course of antibiotics.  Nutrition: -Patient is status post PEG tube placement.  Patient is on tube feeds.  Acute kidney injury versus acute kidney injury on CKD stage III: -Last renal panel was done on 01/17/2020. -Serum creatinine was 1.23 on 01/17/2020.  Hypoglycemia: Resolved.  Nutrition Problem: Severe Malnutrition Etiology: acute illness(intracerebral hemorrage)  Signs/Symptoms: severe fat depletion, severe muscle depletion  Interventions: Tube feeding, Refer to RD note for recommendations  Estimated body mass index is 15.3 kg/m as calculated from the following:   Height as of this encounter: 5\' 9"  (1.753 m).   Weight as of this encounter: 47 kg.  DVT prophylaxis:Lovenox Code Status:Full code Family Communication: Disposition Plan:Await SNF placement   Consultants:  Neurosurgery  Procedures:Decompressive craniectomy and hematoma evacuation and implantation of skull flap in the right abdomen  Antimicrobials:none   Subjective: -No history from patient. -Patient continues to resist medical examination.  Objective: Vitals:   02/03/20 2340 02/04/20 0342 02/04/20 0500 02/04/20 0812  BP: (!) 125/98 (!) 139/95  (!) 131/99  Pulse: (!) 107 85  (!) 102  Resp: 15 18  20   Temp: 97.9 F (36.6 C) 97.9 F (36.6 C)  98.2 F (36.8 C)  TempSrc: Oral Oral  Oral  SpO2: 99% 100%  100%  Weight:   47 kg   Height:        Intake/Output Summary (Last 24 hours) at 02/04/2020 0934 Last data filed at 02/03/2020 1817 Gross per 24 hour  Intake 0 ml  Output --  Net 0 ml   Filed Weights   02/02/20 0344 02/03/20 0500 02/04/20 0500  Weight: 47.5 kg 46 kg 47 kg    Examination:  General exam: Appears calm and comfortable    Data  Reviewed: I have personally reviewed following labs and imaging  studies  CBC: No results for input(s): WBC, NEUTROABS, HGB, HCT, MCV, PLT in the last 168 hours. Basic Metabolic Panel: No results for input(s): NA, K, CL, CO2, GLUCOSE, BUN, CREATININE, CALCIUM, MG, PHOS in the last 168 hours. GFR: Estimated Creatinine Clearance: 53.1 mL/min (by C-G formula based on SCr of 1.23 mg/dL). Liver Function Tests: No results for input(s): AST, ALT, ALKPHOS, BILITOT, PROT, ALBUMIN in the last 168 hours. No results for input(s): LIPASE, AMYLASE in the last 168 hours. No results for input(s): AMMONIA in the last 168 hours. Coagulation Profile: No results for input(s): INR, PROTIME in the last 168 hours. Cardiac Enzymes: No results for input(s): CKTOTAL, CKMB, CKMBINDEX, TROPONINI in the last 168 hours. BNP (last 3 results) No results for input(s): PROBNP in the last 8760 hours. HbA1C: No results for input(s): HGBA1C in the last 72 hours. CBG: Recent Labs  Lab 02/02/20 0744 02/02/20 1144 02/02/20 1605 02/02/20 2003 02/02/20 2342  GLUCAP 90 87 179* 87 135*   Lipid Profile: No results for input(s): CHOL, HDL, LDLCALC, TRIG, CHOLHDL, LDLDIRECT in the last 72 hours. Thyroid Function Tests: No results for input(s): TSH, T4TOTAL, FREET4, T3FREE, THYROIDAB in the last 72 hours. Anemia Panel: No results for input(s): VITAMINB12, FOLATE, FERRITIN, TIBC, IRON, RETICCTPCT in the last 72 hours. Sepsis Labs: No results for input(s): PROCALCITON, LATICACIDVEN in the last 168 hours.  No results found for this or any previous visit (from the past 240 hour(s)).       Radiology Studies: No results found.      Scheduled Meds: . amLODipine  10 mg Per Tube Daily  . chlorhexidine  15 mL Mouth Rinse BID  . cholecalciferol  1,000 Units Oral Daily  . dextrose  1 Tube Oral Once  . dextrose  25 g Intravenous Once  . enoxaparin (LOVENOX) injection  40 mg Subcutaneous Daily  . feeding supplement (JEVITY 1.2 CAL)  474 mL Per Tube 4x daily  . feeding supplement  (PRO-STAT SUGAR FREE 64)  30 mL Per Tube TID  . folic acid  1 mg Per Tube Daily  . free water  300 mL Per Tube Q4H  . hydrALAZINE  25 mg Per Tube Q8H  . insulin aspart  0-15 Units Subcutaneous Q4H  . levETIRAcetam  500 mg Per Tube BID  . mouth rinse  15 mL Mouth Rinse q12n4p  . metoprolol tartrate  25 mg Oral BID  . multivitamin  15 mL Per Tube Daily  . QUEtiapine  50 mg Per Tube QHS  . thiamine  100 mg Per Tube Daily   Continuous Infusions:   LOS: 148 days     Bonnell Public, MD  02/04/2020, 9:34 AM

## 2020-02-05 DIAGNOSIS — I611 Nontraumatic intracerebral hemorrhage in hemisphere, cortical: Secondary | ICD-10-CM | POA: Diagnosis not present

## 2020-02-05 LAB — GLUCOSE, CAPILLARY
Glucose-Capillary: 102 mg/dL — ABNORMAL HIGH (ref 70–99)
Glucose-Capillary: 89 mg/dL (ref 70–99)
Glucose-Capillary: 100 mg/dL — ABNORMAL HIGH (ref 70–99)
Glucose-Capillary: 114 mg/dL — ABNORMAL HIGH (ref 70–99)
Glucose-Capillary: 115 mg/dL — ABNORMAL HIGH (ref 70–99)
Glucose-Capillary: 139 mg/dL — ABNORMAL HIGH (ref 70–99)
Glucose-Capillary: 140 mg/dL — ABNORMAL HIGH (ref 70–99)
Glucose-Capillary: 156 mg/dL — ABNORMAL HIGH (ref 70–99)
Glucose-Capillary: 177 mg/dL — ABNORMAL HIGH (ref 70–99)
Glucose-Capillary: 69 mg/dL — ABNORMAL LOW (ref 70–99)
Glucose-Capillary: 84 mg/dL (ref 70–99)
Glucose-Capillary: 88 mg/dL (ref 70–99)
Glucose-Capillary: 93 mg/dL (ref 70–99)
Glucose-Capillary: 94 mg/dL (ref 70–99)
Glucose-Capillary: 97 mg/dL (ref 70–99)
Glucose-Capillary: 88 mg/dL (ref 70–99)
Glucose-Capillary: 84 mg/dL (ref 70–99)
Glucose-Capillary: 92 mg/dL (ref 70–99)

## 2020-02-05 NOTE — Progress Notes (Signed)
PROGRESS NOTE    Joshua [name]  [id number] DOB: 10-08-1981 DOA: 09/09/2019 PCP: Joshua [name], [name]    Brief Narrative:  Joshua [name] is a 39 year old African-American male admitted by neurosurgery on 09/09/2019 for seizure, right hemiplegia and decreased responsiveness, found to have left hemispheric intracranial hemorrhage, intubated and admitted to ICU.  Patientunderwent craniotomy for decompression on 09/09/2019 by Dr. Vertell Limber, Joshua [name] showed slow neurological recovery and prolonged hospitalization due to seizure and aspiration pneumonia. Joshua [name] continued to be awake but significant dysphagia, PEG tube was placed on 09/25/2019. Placement has been challenging as Joshua [name] is uninsured and needs long-term care. Joshua [name] vomited tube feeding contents on the night of 01/06/2019. CT abdomen was negative for obstruction. Currently Joshua [name] is tolerating tube feedings. Awaiting placement.  Assessment & Plan:   Principal Problem:   ICH (intracerebral hemorrhage) (HCC) Active Problems:   Subdural hematoma (HCC)   Intracerebral hemorrhage (HCC)   Cytotoxic brain edema (HCC)   Hemorrhagic stroke (HCC)   Acute respiratory failure ([place])   Essential hypertension   Severe protein-calorie malnutrition (HCC)   CKD (chronic kidney disease), stage IV (HCC)   Seizure (HCC)   AKI (acute kidney injury) ([place])  Left intracranial hemorrhage: -Likely secondary to uncontrolled hypertension -Joshua [name] is status post decompressive craniectomy and hematoma evacuation 09/09/2019. -CT head no vascular malformation. Dr. Vertell Limber did not recommend replacement of the skull bone with lack of progression. -Joshua [name] remains aphasic with right hemiplegia and severe cognitive impairment. -Joshua [name] continues to refuse care and exam.  Seizure disorder: -On Keppra. -Joshua [name] resists and refuses care and medications. -No recent seizure activity.  Klebsiella UTI: -Treated with cefazolin.  Hypertension: -Blood pressures  currently controlled. Continue Norvasc, hydralazine and metoprolol.  Aspiration pneumonia: -Joshua [name] has completed course of antibiotics.  Nutrition: -Joshua [name] is status post PEG tube placement.  Joshua [name] is on tube feeds.  Acute kidney injury versus acute kidney injury on CKD stage III: -Last renal panel was done on 01/17/2020. -Serum creatinine was 1.23 on 01/17/2020.  Hypoglycemia: Resolved.  Nutrition Problem: Severe Malnutrition Etiology: acute illness(intracerebral hemorrage)  Signs/Symptoms: severe fat depletion, severe muscle depletion  Interventions: Tube feeding, Refer to RD note for recommendations  Estimated body mass index is 15.3 kg/m as calculated from the following:   Height as of this encounter: 5\' 9"  (1.753 m).   Weight as of this encounter: 47 kg.  DVT prophylaxis:Lovenox Code Status:Full code Family Communication: Disposition Plan:Await SNF placement   Consultants:  Neurosurgery  Procedures:Decompressive craniectomy and hematoma evacuation and implantation of skull flap in the right abdomen  Antimicrobials:none   Subjective: -No history from Joshua [name]. -Joshua [name] continues to resist medical examination.  Objective: Vitals:   02/05/20 0320 02/05/20 0808 02/05/20 1202 02/05/20 1554  BP: 131/84 (!) 124/92 (!) 125/94 (!) 131/91  Pulse: 95 91 90 100  Resp: 18 16 16 17   Temp: 98 F (36.7 C) 98 F (36.7 C) 97.6 F (36.4 C) 98.1 F (36.7 C)  TempSrc:  Oral Oral Oral  SpO2: 100% 100% 100% 100%  Weight:      Height:       No intake or output data in the 24 hours ending 02/05/20 1756 Filed Weights   02/02/20 0344 02/03/20 0500 02/04/20 0500  Weight: 47.5 kg 46 kg 47 kg    Examination:  General exam: Appears calm and comfortable  Spastic in RUE and RLE  Data Reviewed: I have personally reviewed following labs and imaging studies  CBC: No results for input(s): WBC, NEUTROABS, HGB, HCT, MCV, PLT in the  last 168 hours. Basic  Metabolic Panel: No results for input(s): NA, K, CL, CO2, GLUCOSE, BUN, CREATININE, CALCIUM, MG, PHOS in the last 168 hours. GFR: Estimated Creatinine Clearance: 53.1 mL/min (by C-G formula based on SCr of 1.23 mg/dL). Liver Function Tests: No results for input(s): AST, ALT, ALKPHOS, BILITOT, PROT, ALBUMIN in the last 168 hours. No results for input(s): LIPASE, AMYLASE in the last 168 hours. No results for input(s): AMMONIA in the last 168 hours. Coagulation Profile: No results for input(s): INR, PROTIME in the last 168 hours. Cardiac Enzymes: No results for input(s): CKTOTAL, CKMB, CKMBINDEX, TROPONINI in the last 168 hours. BNP (last 3 results) No results for input(s): PROBNP in the last 8760 hours. HbA1C: No results for input(s): HGBA1C in the last 72 hours. CBG: Recent Labs  Lab 02/04/20 2343 02/05/20 0309 02/05/20 0811 02/05/20 1203 02/05/20 1623  GLUCAP 177* 102* 89 88 84   Lipid Profile: No results for input(s): CHOL, HDL, LDLCALC, TRIG, CHOLHDL, LDLDIRECT in the last 72 hours. Thyroid Function Tests: No results for input(s): TSH, T4TOTAL, FREET4, T3FREE, THYROIDAB in the last 72 hours. Anemia Panel: No results for input(s): VITAMINB12, FOLATE, FERRITIN, TIBC, IRON, RETICCTPCT in the last 72 hours. Sepsis Labs: No results for input(s): PROCALCITON, LATICACIDVEN in the last 168 hours.  No results found for this or any previous visit (from the past 240 hour(s)).       Radiology Studies: No results found.      Scheduled Meds: . amLODipine  10 mg Per Tube Daily  . chlorhexidine  15 mL Mouth Rinse BID  . cholecalciferol  1,000 Units Oral Daily  . dextrose  1 Tube Oral Once  . dextrose  25 g Intravenous Once  . enoxaparin (LOVENOX) injection  40 mg Subcutaneous Daily  . feeding supplement (JEVITY 1.2 CAL)  474 mL Per Tube 4x daily  . feeding supplement (PRO-STAT SUGAR FREE 64)  30 mL Per Tube TID  . folic acid  1 mg Per Tube Daily  . free water  300 mL Per  Tube Q4H  . hydrALAZINE  25 mg Per Tube Q8H  . insulin aspart  0-15 Units Subcutaneous Q4H  . levETIRAcetam  500 mg Per Tube BID  . mouth rinse  15 mL Mouth Rinse q12n4p  . metoprolol tartrate  25 mg Oral BID  . multivitamin  15 mL Per Tube Daily  . QUEtiapine  50 mg Per Tube QHS  . thiamine  100 mg Per Tube Daily   Continuous Infusions:   LOS: 149 days     Lucky Cowboy, MD  02/05/2020, 5:56 PM

## 2020-02-06 DIAGNOSIS — I611 Nontraumatic intracerebral hemorrhage in hemisphere, cortical: Secondary | ICD-10-CM | POA: Diagnosis not present

## 2020-02-06 LAB — GLUCOSE, CAPILLARY
Glucose-Capillary: 103 mg/dL — ABNORMAL HIGH (ref 70–99)
Glucose-Capillary: 115 mg/dL — ABNORMAL HIGH (ref 70–99)
Glucose-Capillary: 127 mg/dL — ABNORMAL HIGH (ref 70–99)
Glucose-Capillary: 140 mg/dL — ABNORMAL HIGH (ref 70–99)
Glucose-Capillary: 93 mg/dL (ref 70–99)
Glucose-Capillary: 94 mg/dL (ref 70–99)
Glucose-Capillary: 98 mg/dL (ref 70–99)

## 2020-02-06 NOTE — Progress Notes (Signed)
PROGRESS NOTE    Joshua [name]  [id number] DOB: 1981-09-09 DOA: 09/09/2019 PCP: Patient, [name]    Brief Narrative:  Patient is a 39 year old African-American male admitted by neurosurgery on 09/09/2019 for seizure, right hemiplegia and decreased responsiveness, found to have left hemispheric intracranial hemorrhage, intubated and admitted to ICU.  Patientunderwent craniotomy for decompression on 09/09/2019 by Dr. Vertell Limber, patient showed slow neurological recovery and prolonged hospitalization due to seizure and aspiration pneumonia. Patient continued to be awake but significant dysphagia, PEG tube was placed on 09/25/2019. Placement has been challenging as patient is uninsured and needs long-term care. Patient vomited tube feeding contents on the night of 01/06/2019. CT abdomen was negative for obstruction. Currently patient is tolerating tube feedings. Awaiting placement.  Assessment & Plan:   Principal Problem:   ICH (intracerebral hemorrhage) (HCC) Active Problems:   Subdural hematoma (HCC)   Intracerebral hemorrhage (HCC)   Cytotoxic brain edema (HCC)   Hemorrhagic stroke (HCC)   Acute respiratory failure ([facility])   Essential hypertension   Severe protein-calorie malnutrition (HCC)   CKD (chronic kidney disease), stage IV (HCC)   Seizure (HCC)   AKI (acute kidney injury) ([place])  Left intracranial hemorrhage: -Likely secondary to uncontrolled hypertension -Patient is status post decompressive craniectomy and hematoma evacuation 09/09/2019. -CT head no vascular malformation. Dr. Vertell Limber did not recommend replacement of the skull bone with lack of progression. -Patient remains aphasic with right hemiplegia and severe cognitive impairment. -Patient continues to refuse care and exam.  Seizure disorder: -On Keppra. -Patient resists and refuses care and medications. -No recent seizure activity.  Klebsiella UTI: -Treated with cefazolin.  Hypertension: -Blood pressures  currently controlled. Continue Norvasc, hydralazine and metoprolol.  Aspiration pneumonia: -Patient has completed course of antibiotics.  Nutrition: -Patient is status post PEG tube placement.  Patient is on tube feeds.  Acute kidney injury versus acute kidney injury on CKD stage III: -Last renal panel was done on 01/17/2020. -Serum creatinine was 1.23 on 01/17/2020.  Hypoglycemia: Resolved.  Nutrition Problem: Severe Malnutrition Etiology: acute illness(intracerebral hemorrage)  Signs/Symptoms: severe fat depletion, severe muscle depletion  Interventions: Tube feeding, Refer to RD note for recommendations  Estimated body mass index is 15.3 kg/m as calculated from the following:   Height as of this encounter: 5\' 9"  (1.753 m).   Weight as of this encounter: 47 kg.  DVT prophylaxis:Lovenox (will get weekly creatinine while on Lovenox) Code Status:Full code Family Communication: Disposition Plan:Await SNF placement   Consultants:  Neurosurgery  Procedures:Decompressive craniectomy and hematoma evacuation and implantation of skull flap in the right abdomen  Antimicrobials:none   Subjective: -No history from patient. -Patient continues to resist medical examination.  Objective: Vitals:   02/06/20 0403 02/06/20 0757 02/06/20 1206 02/06/20 1553  BP: 115/89 (!) 134/98 115/85 130/90  Pulse: (!) 103 99 95 96  Resp: 19 16 15 18   Temp: 97.7 F (36.5 C) 98.4 F (36.9 C) 98.4 F (36.9 C) 98 F (36.7 C)  TempSrc: Oral Oral Oral Oral  SpO2: 99% 100% 100% 100%  Weight:      Height:       No intake or output data in the 24 hours ending 02/06/20 1718 Filed Weights   02/02/20 0344 02/03/20 0500 02/04/20 0500  Weight: 47.5 kg 46 kg 47 kg    Examination:  General exam: Appears calm and comfortable  Spastic in RUE and RLE  Data Reviewed: I have personally reviewed following labs and imaging studies  CBC: No results for input(s): WBC, NEUTROABS,  HGB, HCT,  MCV, PLT in the last 168 hours. Basic Metabolic Panel: No results for input(s): NA, K, CL, CO2, GLUCOSE, BUN, CREATININE, CALCIUM, MG, PHOS in the last 168 hours. GFR: Estimated Creatinine Clearance: 53.1 mL/min (by C-G formula based on SCr of 1.23 mg/dL). Liver Function Tests: No results for input(s): AST, ALT, ALKPHOS, BILITOT, PROT, ALBUMIN in the last 168 hours. No results for input(s): LIPASE, AMYLASE in the last 168 hours. No results for input(s): AMMONIA in the last 168 hours. Coagulation Profile: No results for input(s): INR, PROTIME in the last 168 hours. Cardiac Enzymes: No results for input(s): CKTOTAL, CKMB, CKMBINDEX, TROPONINI in the last 168 hours. BNP (last 3 results) No results for input(s): PROBNP in the last 8760 hours. HbA1C: No results for input(s): HGBA1C in the last 72 hours. CBG: Recent Labs  Lab 02/05/20 2346 02/06/20 0401 02/06/20 0800 02/06/20 1205 02/06/20 1556  GLUCAP 127* 94 93 103* 115*   Lipid Profile: No results for input(s): CHOL, HDL, LDLCALC, TRIG, CHOLHDL, LDLDIRECT in the last 72 hours. Thyroid Function Tests: No results for input(s): TSH, T4TOTAL, FREET4, T3FREE, THYROIDAB in the last 72 hours. Anemia Panel: No results for input(s): VITAMINB12, FOLATE, FERRITIN, TIBC, IRON, RETICCTPCT in the last 72 hours. Sepsis Labs: No results for input(s): PROCALCITON, LATICACIDVEN in the last 168 hours.  No results found for this or any previous visit (from the past 240 hour(s)).       Radiology Studies: No results found.      Scheduled Meds: . amLODipine  10 mg Per Tube Daily  . chlorhexidine  15 mL Mouth Rinse BID  . cholecalciferol  1,000 Units Oral Daily  . dextrose  1 Tube Oral Once  . dextrose  25 g Intravenous Once  . enoxaparin (LOVENOX) injection  40 mg Subcutaneous Daily  . feeding supplement (JEVITY 1.2 CAL)  474 mL Per Tube 4x daily  . feeding supplement (PRO-STAT SUGAR FREE 64)  30 mL Per Tube TID  . folic acid  1 mg  Per Tube Daily  . free water  300 mL Per Tube Q4H  . hydrALAZINE  25 mg Per Tube Q8H  . insulin aspart  0-15 Units Subcutaneous Q4H  . levETIRAcetam  500 mg Per Tube BID  . mouth rinse  15 mL Mouth Rinse q12n4p  . metoprolol tartrate  25 mg Oral BID  . multivitamin  15 mL Per Tube Daily  . QUEtiapine  50 mg Per Tube QHS  . thiamine  100 mg Per Tube Daily   Continuous Infusions:   LOS: 150 days    Lucky Cowboy, MD  02/06/2020, 5:18 PM

## 2020-02-07 DIAGNOSIS — I611 Nontraumatic intracerebral hemorrhage in hemisphere, cortical: Secondary | ICD-10-CM | POA: Diagnosis not present

## 2020-02-07 LAB — GLUCOSE, CAPILLARY
Glucose-Capillary: 93 mg/dL (ref 70–99)
Glucose-Capillary: 85 mg/dL (ref 70–99)
Glucose-Capillary: 90 mg/dL (ref 70–99)
Glucose-Capillary: 94 mg/dL (ref 70–99)
Glucose-Capillary: 89 mg/dL (ref 70–99)
Glucose-Capillary: 101 mg/dL — ABNORMAL HIGH (ref 70–99)
Glucose-Capillary: 100 mg/dL — ABNORMAL HIGH (ref 70–99)

## 2020-02-07 MED ORDER — PRO-STAT SUGAR FREE PO LIQD
30.0000 mL | Freq: Four times a day (QID) | ORAL | Status: DC
  Administered 2020-02-07 – 2020-05-02 (×287): 30 mL
  Filled 2020-02-07 (×298): qty 30

## 2020-02-07 NOTE — Progress Notes (Signed)
PROGRESS NOTE    Joshua [name]  [id number] DOB: 01-02-1981 DOA: 09/09/2019 PCP: Patient, [name]    Brief Narrative:  Patient is a 39 year old African-American male admitted by neurosurgery on 09/09/2019 for seizure, right hemiplegia and decreased responsiveness, found to have left hemispheric intracranial hemorrhage, intubated and admitted to ICU.  Patientunderwent craniotomy for decompression on 09/09/2019 by Dr. Vertell Limber, patient showed slow neurological recovery and prolonged hospitalization due to seizure and aspiration pneumonia. Patient continued to be awake but significant dysphagia, PEG tube was placed on 09/25/2019. Placement has been challenging as patient is uninsured and needs long-term care. Patient vomited tube feeding contents on the night of 01/06/2019. CT abdomen was negative for obstruction. Currently patient is tolerating tube feedings. Awaiting placement.  Assessment & Plan:   Principal Problem:   ICH (intracerebral hemorrhage) (HCC) Active Problems:   Subdural hematoma (HCC)   Intracerebral hemorrhage (HCC)   Cytotoxic brain edema (HCC)   Hemorrhagic stroke (HCC)   Acute respiratory failure ([place])   Essential hypertension   Severe protein-calorie malnutrition (HCC)   CKD (chronic kidney disease), stage IV (HCC)   Seizure (HCC)   AKI (acute kidney injury) ([place])  Left intracranial hemorrhage: -Likely secondary to uncontrolled hypertension -Patient is status post decompressive craniectomy and hematoma evacuation 09/09/2019. -CT head no vascular malformation. Dr. Vertell Limber did not recommend replacement of the skull bone with lack of progression. -Patient remains aphasic with right hemiplegia and severe cognitive impairment. -Patient continues to refuse care and exam.  Seizure disorder: -On Keppra. -Patient resists and refuses care and medications. -No recent seizure activity.  Klebsiella UTI: -Treated with cefazolin.  Hypertension: -Blood pressures  currently controlled. Continue Norvasc, hydralazine and metoprolol.  Aspiration pneumonia: -Patient has completed course of antibiotics.  Nutrition: -Patient is status post PEG tube placement.  Patient is on tube feeds.  Acute kidney injury versus acute kidney injury on CKD stage III: -Last renal panel was done on 01/17/2020. -Serum creatinine was 1.23 on 01/17/2020.  Hypoglycemia: Resolved.  Nutrition Problem: Severe Malnutrition Etiology: acute illness(intracerebral hemorrage)  Signs/Symptoms: severe fat depletion, severe muscle depletion  Interventions: Tube feeding, Refer to RD note for recommendations  Estimated body mass index is 15.3 kg/m as calculated from the following:   Height as of this encounter: 5\' 9"  (1.753 m).   Weight as of this encounter: 47 kg.  DVT prophylaxis:Lovenox (will get weekly creatinine while on Lovenox) Code Status:Full code Family Communication: Disposition Plan:Await SNF placement   Consultants:  Neurosurgery  Procedures:Decompressive craniectomy and hematoma evacuation and implantation of skull flap in the right abdomen  Antimicrobials:none   Subjective: -No history from patient. -Patient continues to resist medical examination.  Objective: Vitals:   02/07/20 0327 02/07/20 0825 02/07/20 1137 02/07/20 1524  BP: 133/90 (!) 134/97 115/77 (!) 130/93  Pulse: 83 91 92 99  Resp: 18 18 18 18   Temp: 98 F (36.7 C) 97.6 F (36.4 C) 98 F (36.7 C) 98.8 F (37.1 C)  TempSrc:  Oral Oral Axillary  SpO2: 100% 100% 100% 100%  Weight:      Height:       No intake or output data in the 24 hours ending 02/07/20 1939 Filed Weights   02/02/20 0344 02/03/20 0500 02/04/20 0500  Weight: 47.5 kg 46 kg 47 kg    Examination:  General exam: Appears calm and comfortable  Spastic in RUE and RLE  Data Reviewed: I have personally reviewed following labs and imaging studies  CBC: No results for input(s): WBC, NEUTROABS,  HGB, HCT,  MCV, PLT in the last 168 hours. Basic Metabolic Panel: No results for input(s): NA, K, CL, CO2, GLUCOSE, BUN, CREATININE, CALCIUM, MG, PHOS in the last 168 hours. GFR: CrCl cannot be calculated (Patient's most recent lab result is older than the maximum 21 days allowed.). Liver Function Tests: No results for input(s): AST, ALT, ALKPHOS, BILITOT, PROT, ALBUMIN in the last 168 hours. No results for input(s): LIPASE, AMYLASE in the last 168 hours. No results for input(s): AMMONIA in the last 168 hours. Coagulation Profile: No results for input(s): INR, PROTIME in the last 168 hours. Cardiac Enzymes: No results for input(s): CKTOTAL, CKMB, CKMBINDEX, TROPONINI in the last 168 hours. BNP (last 3 results) No results for input(s): PROBNP in the last 8760 hours. HbA1C: No results for input(s): HGBA1C in the last 72 hours. CBG: Recent Labs  Lab 02/07/20 0326 02/07/20 0743 02/07/20 0846 02/07/20 1155 02/07/20 1639  GLUCAP 93 85 90 94 89   Lipid Profile: No results for input(s): CHOL, HDL, LDLCALC, TRIG, CHOLHDL, LDLDIRECT in the last 72 hours. Thyroid Function Tests: No results for input(s): TSH, T4TOTAL, FREET4, T3FREE, THYROIDAB in the last 72 hours. Anemia Panel: No results for input(s): VITAMINB12, FOLATE, FERRITIN, TIBC, IRON, RETICCTPCT in the last 72 hours. Sepsis Labs: No results for input(s): PROCALCITON, LATICACIDVEN in the last 168 hours.  No results found for this or any previous visit (from the past 240 hour(s)).       Radiology Studies: No results found.      Scheduled Meds: . amLODipine  10 mg Per Tube Daily  . chlorhexidine  15 mL Mouth Rinse BID  . cholecalciferol  1,000 Units Oral Daily  . dextrose  1 Tube Oral Once  . dextrose  25 g Intravenous Once  . enoxaparin (LOVENOX) injection  40 mg Subcutaneous Daily  . feeding supplement (JEVITY 1.2 CAL)  474 mL Per Tube 4x daily  . feeding supplement (PRO-STAT SUGAR FREE 64)  30 mL Per Tube QID  . folic  acid  1 mg Per Tube Daily  . free water  300 mL Per Tube Q4H  . hydrALAZINE  25 mg Per Tube Q8H  . insulin aspart  0-15 Units Subcutaneous Q4H  . levETIRAcetam  500 mg Per Tube BID  . mouth rinse  15 mL Mouth Rinse q12n4p  . metoprolol tartrate  25 mg Oral BID  . multivitamin  15 mL Per Tube Daily  . QUEtiapine  50 mg Per Tube QHS  . thiamine  100 mg Per Tube Daily   Continuous Infusions:   LOS: 151 days    Lucky Cowboy, MD  02/07/2020, 7:39 PM

## 2020-02-07 NOTE — Progress Notes (Signed)
Nutrition Follow-up  RD working remotely.   DOCUMENTATION CODES:   Severe malnutrition in context of acute illness/injury  INTERVENTION:  Continue Jevity 1.2 [employer] PEG at goal volume of 474 ml (2 cartons/ARCs) given QID.   Provide 30 ml ProstatQID per tube.   Free water flushes of 300 ml every 4 hours per tube. (MD to adjust as appropriate)  Tube feeding to provide 2675 kcal, 165grams of protein, and 3323m free water.  Continue PO intake for comfort as appropriate/tolerated.   NUTRITION DIAGNOSIS:   Severe Malnutrition related to acute illness(intracerebral hemorrage) as evidenced by severe fat depletion, severe muscle depletion.  Ongoing.  GOAL:   Patient will meet greater than or equal to 90% of their needs  Met with TF.  MONITOR:   TF tolerance, Skin, Weight trends, Labs, I & O's  REASON FOR ASSESSMENT:   Ventilator, Consult Enteral/tube feeding initiation and management  ASSESSMENT:   39yo male admitted with large left intracranial hemorrhage; S/P craniotomy. PMH includes HTN, alcoholism, noncompliance.  9/12left frontal temporal craniectomy and evacuation hematoma.  9/23 Extubated.  10/1 PEG placed 11/23- Nutrition Focused Physical Exam performed, severe subcutaneous fat loss and severe muscle wasting identified.  RD unable to reach pt via phone.   Per MD, pt continues to tolerate tube feeding well and is awaiting placement.   PO intake: 0% x last 6 recorded meals (last recorded 02/03/20). Per RN, pt still not consuming anything PO.  Current tube feeding regimen: Jevity 1.2 Cal QID, 323mPro-stat TID, 30040mree water QID  Medications reviewed and include: Vitamin D3, Glutose, Dextrose 50%, Folvite, SSI, MVI liquid, Vitamin B1  Labs reviewed. CBGs 93-140   Diet Order:   Diet Order            Diet regular Room service appropriate? Yes; Fluid consistency: Thin  Diet effective now              EDUCATION NEEDS:   Not  appropriate for education at this time  Skin:  Skin Assessment: Reviewed RN Assessment  Last BM:  02/06/20  Height:   Ht Readings from Last 1 Encounters:  09/09/19 '5\' 9"'  (1.753 m)    Weight:   Wt Readings from Last 1 Encounters:  02/04/20 47 kg    Ideal Body Weight:  72.7 kg  BMI:  Body mass index is 15.3 kg/m.  Estimated Nutritional Needs:   Kcal:  [id number]:  130-170 grams  Fluid:  >/= 2 L/day   AmaLarkin InaS, RD, LDN RD pager number and weekend/on-call pager number located in [place]

## 2020-02-08 DIAGNOSIS — I611 Nontraumatic intracerebral hemorrhage in hemisphere, cortical: Secondary | ICD-10-CM | POA: Diagnosis not present

## 2020-02-08 LAB — GLUCOSE, CAPILLARY
Glucose-Capillary: 95 mg/dL (ref 70–99)
Glucose-Capillary: 91 mg/dL (ref 70–99)
Glucose-Capillary: 83 mg/dL (ref 70–99)
Glucose-Capillary: 122 mg/dL — ABNORMAL HIGH (ref 70–99)

## 2020-02-08 MED ORDER — WHITE PETROLATUM EX OINT
TOPICAL_OINTMENT | CUTANEOUS | Status: AC
  Filled 2020-02-08: qty 28.35

## 2020-02-08 MED ORDER — METOPROLOL TARTRATE 25 MG PO TABS
25.0000 mg | ORAL_TABLET | Freq: Two times a day (BID) | ORAL | Status: DC
  Administered 2020-02-08 – 2020-05-02 (×154): 25 mg
  Filled 2020-02-08 (×159): qty 1

## 2020-02-08 NOTE — Progress Notes (Addendum)
Patient irritable and forcefully pushing staff members away when trying to clean him and give medications. Dr. Posey Pronto notified, will continue to attempt and encourage patient.

## 2020-02-08 NOTE — Progress Notes (Signed)
PROGRESS NOTE    Joshua [name]  [id number] DOB: Mar 26, 1981 DOA: 09/09/2019 PCP: Patient, [name]    Brief Narrative:  Patient is a 39 year old African-American male admitted by neurosurgery on 09/09/2019 for seizure, right hemiplegia and decreased responsiveness, found to have left hemispheric intracranial hemorrhage, intubated and admitted to ICU.  Patientunderwent craniotomy for decompression on 09/09/2019 by Dr. Vertell Limber, patient showed slow neurological recovery and prolonged hospitalization due to seizure and aspiration pneumonia. Patient continued to be awake but significant dysphagia, PEG tube was placed on 09/25/2019. Placement has been challenging as patient is uninsured and needs long-term care. Patient vomited tube feeding contents on the night of 01/06/2019. CT abdomen was negative for obstruction. Currently patient is tolerating tube feedings. Awaiting placement.  Assessment & Plan:   Principal Problem:   ICH (intracerebral hemorrhage) (HCC) Active Problems:   Subdural hematoma (HCC)   Intracerebral hemorrhage (HCC)   Cytotoxic brain edema (HCC)   Hemorrhagic stroke (HCC)   Acute respiratory failure ([place])   Essential hypertension   Severe protein-calorie malnutrition (HCC)   CKD (chronic kidney disease), stage IV (HCC)   Seizure (HCC)   AKI (acute kidney injury) ([place])  Left intracranial hemorrhage: -Likely secondary to uncontrolled hypertension -Patient is status post decompressive craniectomy and hematoma evacuation 09/09/2019. -CT head no vascular malformation. Dr. Vertell Limber did not recommend replacement of the skull bone with lack of progression. -Patient remains aphasic with right hemiplegia and severe cognitive impairment. -Patient continues to refuse care and exam.  Seizure disorder: -On Keppra. -Patient resists and refuses care and medications. -No recent seizure activity.  Klebsiella UTI: -Treated with cefazolin.  Hypertension: -Blood pressures  currently controlled. Continue Norvasc, hydralazine and metoprolol.  Aspiration pneumonia: -Patient has completed course of antibiotics.  Nutrition: -Patient is status post PEG tube placement.  Patient is on tube feeds.  Acute kidney injury versus acute kidney injury on CKD stage III: -Last renal panel was done on 01/17/2020. -Serum creatinine was 1.23 on 01/17/2020.  Hypoglycemia: Resolved.  Nutrition Problem: Severe Malnutrition Etiology: acute illness(intracerebral hemorrage)  Signs/Symptoms: severe fat depletion, severe muscle depletion  Interventions: Tube feeding, Refer to RD note for recommendations  Estimated body mass index is 18.88 kg/m as calculated from the following:   Height as of this encounter: 5\' 9"  (1.753 m).   Weight as of this encounter: 58 kg.  DVT prophylaxis:Lovenox  Code Status:Full code Family Communication: Disposition Plan:Await SNF placement   Consultants:  Neurosurgery  Procedures:Decompressive craniectomy and hematoma evacuation and implantation of skull flap in the right abdomen  Antimicrobials:none   Subjective: -No history from patient. -Patient continues to resist medical examination.  Objective: Vitals:   02/08/20 0500 02/08/20 0816 02/08/20 1216 02/08/20 1622  BP:  108/80 110/84 (!) 139/101  Pulse:  (!) 103 100 78  Resp:  17 18 18   Temp:  97.6 F (36.4 C) 98 F (36.7 C) 98.2 F (36.8 C)  TempSrc:  Axillary Axillary Axillary  SpO2:  100% 98% 100%  Weight: 58 kg     Height:       No intake or output data in the 24 hours ending 02/08/20 1815 Filed Weights   02/03/20 0500 02/04/20 0500 02/08/20 0500  Weight: 46 kg 47 kg 58 kg    Examination:  General exam: Appears calm and comfortable  Spastic in RUE and RLE  Data Reviewed: I have personally reviewed following labs and imaging studies  CBC: No results for input(s): WBC, NEUTROABS, HGB, HCT, MCV, PLT in the last 168  hours. Basic Metabolic Panel: No  results for input(s): NA, K, CL, CO2, GLUCOSE, BUN, CREATININE, CALCIUM, MG, PHOS in the last 168 hours. GFR: CrCl cannot be calculated (Patient's most recent lab result is older than the maximum 21 days allowed.). Liver Function Tests: No results for input(s): AST, ALT, ALKPHOS, BILITOT, PROT, ALBUMIN in the last 168 hours. No results for input(s): LIPASE, AMYLASE in the last 168 hours. No results for input(s): AMMONIA in the last 168 hours. Coagulation Profile: No results for input(s): INR, PROTIME in the last 168 hours. Cardiac Enzymes: No results for input(s): CKTOTAL, CKMB, CKMBINDEX, TROPONINI in the last 168 hours. BNP (last 3 results) No results for input(s): PROBNP in the last 8760 hours. HbA1C: No results for input(s): HGBA1C in the last 72 hours. CBG: Recent Labs  Lab 02/07/20 2000 02/07/20 2316 02/08/20 0354 02/08/20 0747 02/08/20 1653  GLUCAP 101* 100* 95 91 83   Lipid Profile: No results for input(s): CHOL, HDL, LDLCALC, TRIG, CHOLHDL, LDLDIRECT in the last 72 hours. Thyroid Function Tests: No results for input(s): TSH, T4TOTAL, FREET4, T3FREE, THYROIDAB in the last 72 hours. Anemia Panel: No results for input(s): VITAMINB12, FOLATE, FERRITIN, TIBC, IRON, RETICCTPCT in the last 72 hours. Sepsis Labs: No results for input(s): PROCALCITON, LATICACIDVEN in the last 168 hours.  No results found for this or any previous visit (from the past 240 hour(s)).       Radiology Studies: No results found.      Scheduled Meds: . amLODipine  10 mg Per Tube Daily  . chlorhexidine  15 mL Mouth Rinse BID  . cholecalciferol  1,000 Units Oral Daily  . dextrose  1 Tube Oral Once  . dextrose  25 g Intravenous Once  . enoxaparin (LOVENOX) injection  40 mg Subcutaneous Daily  . feeding supplement (JEVITY 1.2 CAL)  474 mL Per Tube 4x daily  . feeding supplement (PRO-STAT SUGAR FREE 64)  30 mL Per Tube QID  . folic acid  1 mg Per Tube Daily  . free water  300 mL Per Tube  Q4H  . hydrALAZINE  25 mg Per Tube Q8H  . insulin aspart  0-15 Units Subcutaneous Q4H  . levETIRAcetam  500 mg Per Tube BID  . mouth rinse  15 mL Mouth Rinse q12n4p  . metoprolol tartrate  25 mg Oral BID  . multivitamin  15 mL Per Tube Daily  . QUEtiapine  50 mg Per Tube QHS  . thiamine  100 mg Per Tube Daily   Continuous Infusions:   LOS: 152 days    Lucky Cowboy, MD  02/08/2020, 6:15 PM

## 2020-02-09 DIAGNOSIS — I611 Nontraumatic intracerebral hemorrhage in hemisphere, cortical: Secondary | ICD-10-CM | POA: Diagnosis not present

## 2020-02-09 LAB — GLUCOSE, CAPILLARY
Glucose-Capillary: 100 mg/dL — ABNORMAL HIGH (ref 70–99)
Glucose-Capillary: 106 mg/dL — ABNORMAL HIGH (ref 70–99)
Glucose-Capillary: 110 mg/dL — ABNORMAL HIGH (ref 70–99)
Glucose-Capillary: 145 mg/dL — ABNORMAL HIGH (ref 70–99)
Glucose-Capillary: 56 mg/dL — ABNORMAL LOW (ref 70–99)
Glucose-Capillary: 78 mg/dL (ref 70–99)
Glucose-Capillary: 85 mg/dL (ref 70–99)
Glucose-Capillary: 89 mg/dL (ref 70–99)

## 2020-02-09 NOTE — Progress Notes (Signed)
PROGRESS NOTE    Joshua [name]  [id number] DOB: [birth year] DOA: 09/09/2019 PCP: Patient, [name]    Brief Narrative:  Patient is a 39 year old African-American male admitted by neurosurgery on 09/09/2019 for seizure, right hemiplegia and decreased responsiveness, found to have left hemispheric intracranial hemorrhage, intubated and admitted to ICU.  Patientunderwent craniotomy for decompression on 09/09/2019 by Dr. Vertell Limber, patient showed slow neurological recovery and prolonged hospitalization due to seizure and aspiration pneumonia. Patient continued to be awake but significant dysphagia, PEG tube was placed on 09/25/2019. Placement has been challenging as patient is uninsured and needs long-term care. Patient vomited tube feeding contents on the night of 01/06/2019. CT abdomen was negative for obstruction. Currently patient is tolerating tube feedings. Awaiting placement.  Assessment & Plan:   Principal Problem:   ICH (intracerebral hemorrhage) (HCC) Active Problems:   Subdural hematoma (HCC)   Intracerebral hemorrhage (HCC)   Cytotoxic brain edema (HCC)   Hemorrhagic stroke (HCC)   Acute respiratory failure ([place])   Essential hypertension   Severe protein-calorie malnutrition (HCC)   CKD (chronic kidney disease), stage IV (HCC)   Seizure (HCC)   AKI (acute kidney injury) ([place])  Left intracranial hemorrhage: -Likely secondary to uncontrolled hypertension -Patient is status post decompressive craniectomy and hematoma evacuation 09/09/2019. -CT head no vascular malformation. Dr. Vertell Limber did not recommend replacement of the skull bone with lack of progression. -Patient remains aphasic with right hemiplegia and severe cognitive impairment. -Patient continues to refuse care and exam.  Seizure disorder: -On Keppra. -Patient resists and refuses care and medications. -No recent seizure activity.  Klebsiella UTI: -Treated with cefazolin.  Hypertension: -Blood pressures  currently controlled. Continue Norvasc, hydralazine and metoprolol.  Aspiration pneumonia: -Patient has completed course of antibiotics.  Nutrition: -Patient is status post PEG tube placement.  Patient is on tube feeds.  Acute kidney injury versus acute kidney injury on CKD stage III: -Last renal panel was done on 01/17/2020. -Serum creatinine was 1.23 on 01/17/2020.  Hypoglycemia: Resolved.  Nutrition Problem: Severe Malnutrition Etiology: acute illness(intracerebral hemorrage)  Signs/Symptoms: severe fat depletion, severe muscle depletion  Interventions: Tube feeding, Refer to RD note for recommendations  Estimated body mass index is 18.88 kg/m as calculated from the following:   Height as of this encounter: 5\' 9"  (1.753 m).   Weight as of this encounter: 58 kg.  DVT prophylaxis:Lovenox  Code Status:Full code Family Communication: Disposition Plan:Await SNF placement   Consultants:  Neurosurgery  Procedures:Decompressive craniectomy and hematoma evacuation and implantation of skull flap in the right abdomen  Antimicrobials:none   Subjective: -No history from patient. -Patient continues to resist medical examination.  Objective: Vitals:   02/09/20 0328 02/09/20 0737 02/09/20 1132 02/09/20 1527  BP: 114/89 (!) 124/92 (!) 127/103 123/83  Pulse: 89 90 80 (!) 109  Resp: 16 15 15 16   Temp: 98 F (36.7 C) 97.6 F (36.4 C) 98.5 F (36.9 C) 98.7 F (37.1 C)  TempSrc: Oral Oral Oral   SpO2: 100% 100% 100% 99%  Weight:      Height:        Intake/Output Summary (Last 24 hours) at 02/09/2020 1645 Last data filed at 02/09/2020 0328 Gross per 24 hour  Intake -  Output 0 ml  Net 0 ml   Filed Weights   02/03/20 0500 02/04/20 0500 02/08/20 0500  Weight: 46 kg 47 kg 58 kg    Examination:  General exam: Appears calm and comfortable  Spastic in RUE and RLE  Data Reviewed: I have  personally reviewed following labs and imaging studies  CBC: No  results for input(s): WBC, NEUTROABS, HGB, HCT, MCV, PLT in the last 168 hours. Basic Metabolic Panel: No results for input(s): NA, K, CL, CO2, GLUCOSE, BUN, CREATININE, CALCIUM, MG, PHOS in the last 168 hours. GFR: CrCl cannot be calculated (Patient's most recent lab result is older than the maximum 21 days allowed.). Liver Function Tests: No results for input(s): AST, ALT, ALKPHOS, BILITOT, PROT, ALBUMIN in the last 168 hours. No results for input(s): LIPASE, AMYLASE in the last 168 hours. No results for input(s): AMMONIA in the last 168 hours. Coagulation Profile: No results for input(s): INR, PROTIME in the last 168 hours. Cardiac Enzymes: No results for input(s): CKTOTAL, CKMB, CKMBINDEX, TROPONINI in the last 168 hours. BNP (last 3 results) No results for input(s): PROBNP in the last 8760 hours. HbA1C: No results for input(s): HGBA1C in the last 72 hours. CBG: Recent Labs  Lab 02/08/20 2324 02/09/20 0327 02/09/20 0742 02/09/20 1136 02/09/20 1530  GLUCAP 106* 89 85 78 145*   Lipid Profile: No results for input(s): CHOL, HDL, LDLCALC, TRIG, CHOLHDL, LDLDIRECT in the last 72 hours. Thyroid Function Tests: No results for input(s): TSH, T4TOTAL, FREET4, T3FREE, THYROIDAB in the last 72 hours. Anemia Panel: No results for input(s): VITAMINB12, FOLATE, FERRITIN, TIBC, IRON, RETICCTPCT in the last 72 hours. Sepsis Labs: No results for input(s): PROCALCITON, LATICACIDVEN in the last 168 hours.  No results found for this or any previous visit (from the past 240 hour(s)).       Radiology Studies: No results found.      Scheduled Meds: . amLODipine  10 mg Per Tube Daily  . chlorhexidine  15 mL Mouth Rinse BID  . cholecalciferol  1,000 Units Oral Daily  . dextrose  1 Tube Oral Once  . dextrose  25 g Intravenous Once  . enoxaparin (LOVENOX) injection  40 mg Subcutaneous Daily  . feeding supplement (JEVITY 1.2 CAL)  474 mL Per Tube 4x daily  . feeding supplement  (PRO-STAT SUGAR FREE 64)  30 mL Per Tube QID  . folic acid  1 mg Per Tube Daily  . free water  300 mL Per Tube Q4H  . hydrALAZINE  25 mg Per Tube Q8H  . insulin aspart  0-15 Units Subcutaneous Q4H  . levETIRAcetam  500 mg Per Tube BID  . mouth rinse  15 mL Mouth Rinse q12n4p  . metoprolol tartrate  25 mg Per Tube BID  . multivitamin  15 mL Per Tube Daily  . QUEtiapine  50 mg Per Tube QHS  . thiamine  100 mg Per Tube Daily   Continuous Infusions:   LOS: 153 days    Lucky Cowboy, MD  02/09/2020, 4:45 PM

## 2020-02-09 NOTE — TOC Progression Note (Signed)
Transition of Care [facility]) - Progression Note    Patient Details  Name: Hamid Haser MRN: [id number] Date of Birth: 01/25/1981  Transition of Care [facility] CM/SW Contact  Pollie Friar, RN Phone Number: 02/09/2020, 3:06 PM  Clinical Narrative:    Continues not to have any bed offers.  ToC following.   Expected Discharge Plan: Skilled Nursing Facility Barriers to Discharge: SNF Pending payor source - [facility], SNF Pending bed offer, Inadequate or no insurance  Expected Discharge Plan and Services Expected Discharge Plan: [place] In-house Referral: Clinical Social Work Discharge Planning Services: NA Post Acute Care Choice: [place] Living arrangements for the past 2 months: Apartment                 DME Arranged: N/A DME Agency: NA       HH Arranged: NA HH Agency: NA         Social Determinants of Health (SDOH) Interventions    Readmission Risk Interventions No flowsheet data found.

## 2020-02-10 DIAGNOSIS — I611 Nontraumatic intracerebral hemorrhage in hemisphere, cortical: Secondary | ICD-10-CM | POA: Diagnosis not present

## 2020-02-10 NOTE — Progress Notes (Signed)
PROGRESS NOTE    Joshua [name]  [id number] DOB: [birth year] DOA: 09/09/2019 PCP: Patient, [name]    Brief Narrative:  Patient is a 39 year old African-American male admitted by neurosurgery on 09/09/2019 for seizure, right hemiplegia and decreased responsiveness, found to have left hemispheric intracranial hemorrhage, intubated and admitted to ICU.  Patientunderwent craniotomy for decompression on 09/09/2019 by Dr. Vertell Limber, patient showed slow neurological recovery and prolonged hospitalization due to seizure and aspiration pneumonia. Patient continued to be awake but significant dysphagia, PEG tube was placed on 09/25/2019. Placement has been challenging as patient is uninsured and needs long-term care. Patient vomited tube feeding contents on the night of 01/06/2019. CT abdomen was negative for obstruction. Currently patient is tolerating tube feedings. Awaiting placement.  Assessment & Plan:   Principal Problem:   ICH (intracerebral hemorrhage) (HCC) Active Problems:   Subdural hematoma (HCC)   Intracerebral hemorrhage (HCC)   Cytotoxic brain edema (HCC)   Hemorrhagic stroke (HCC)   Acute respiratory failure ([place])   Essential hypertension   Severe protein-calorie malnutrition (HCC)   CKD (chronic kidney disease), stage IV (HCC)   Seizure (HCC)   AKI (acute kidney injury) ([place])  Left intracranial hemorrhage: -Likely secondary to uncontrolled hypertension -Patient is status post decompressive craniectomy and hematoma evacuation 09/09/2019. -CT head no vascular malformation. Dr. Vertell Limber did not recommend replacement of the skull bone with lack of progression. -Patient remains aphasic with right hemiplegia and severe cognitive impairment. -Patient continues to refuse care and exam.  Seizure disorder: -On Keppra. -Patient resists and refuses care and medications. -No recent seizure activity.  Klebsiella UTI: -Treated with cefazolin.  Hypertension: -Blood pressures  currently controlled. Continue Norvasc, hydralazine and metoprolol.  Aspiration pneumonia: -Patient has completed course of antibiotics.  Nutrition: -Patient is status post PEG tube placement.  Patient is on tube feeds.  Acute kidney injury versus acute kidney injury on CKD stage III: -Last renal panel was done on 01/17/2020. -Serum creatinine was 1.23 on 01/17/2020.  Hypoglycemia: Resolved.  Nutrition Problem: Severe Malnutrition Etiology: acute illness(intracerebral hemorrage)  Signs/Symptoms: severe fat depletion, severe muscle depletion  Interventions: Tube feeding, Refer to RD note for recommendations  Estimated body mass index is 18.88 kg/m as calculated from the following:   Height as of this encounter: 5\' 9"  (1.753 m).   Weight as of this encounter: 58 kg.  DVT prophylaxis:Lovenox  Code Status:Full code Family Communication: Disposition Plan:Await SNF placement   Consultants:  Neurosurgery  Procedures:Decompressive craniectomy and hematoma evacuation and implantation of skull flap in the right abdomen  Antimicrobials:none   Subjective: -No history from patient. -Patient continues to resist medical examination.  Objective: Vitals:   02/10/20 0342 02/10/20 0546 02/10/20 0805 02/10/20 1328  BP: 109/88 (!) 133/102 113/90 104/89  Pulse: 77 94 100 98  Resp: 16 16 16 18   Temp: 98.1 F (36.7 C)  98.1 F (36.7 C) 98.5 F (36.9 C)  TempSrc:   Oral Oral  SpO2: 100% 100% 99% 96%  Weight:      Height:        Intake/Output Summary (Last 24 hours) at 02/10/2020 1506 Last data filed at 02/10/2020 [id number] Gross per 24 hour  Intake 3548 ml  Output 0 ml  Net 3548 ml   Filed Weights   02/03/20 0500 02/04/20 0500 02/08/20 0500  Weight: 46 kg 47 kg 58 kg    Examination:  General exam: Appears calm and comfortable  Spastic in RUE and RLE  Data Reviewed: I have personally reviewed following labs  and imaging studies  CBC: No results for input(s):  WBC, NEUTROABS, HGB, HCT, MCV, PLT in the last 168 hours. Basic Metabolic Panel: No results for input(s): NA, K, CL, CO2, GLUCOSE, BUN, CREATININE, CALCIUM, MG, PHOS in the last 168 hours. GFR: CrCl cannot be calculated (Patient's most recent lab result is older than the maximum 21 days allowed.). Liver Function Tests: No results for input(s): AST, ALT, ALKPHOS, BILITOT, PROT, ALBUMIN in the last 168 hours. No results for input(s): LIPASE, AMYLASE in the last 168 hours. No results for input(s): AMMONIA in the last 168 hours. Coagulation Profile: No results for input(s): INR, PROTIME in the last 168 hours. Cardiac Enzymes: No results for input(s): CKTOTAL, CKMB, CKMBINDEX, TROPONINI in the last 168 hours. BNP (last 3 results) No results for input(s): PROBNP in the last 8760 hours. HbA1C: No results for input(s): HGBA1C in the last 72 hours. CBG: Recent Labs  Lab 02/09/20 0742 02/09/20 1136 02/09/20 1530 02/09/20 2040 02/09/20 2349  GLUCAP 85 78 145* 110* 100*   Lipid Profile: No results for input(s): CHOL, HDL, LDLCALC, TRIG, CHOLHDL, LDLDIRECT in the last 72 hours. Thyroid Function Tests: No results for input(s): TSH, T4TOTAL, FREET4, T3FREE, THYROIDAB in the last 72 hours. Anemia Panel: No results for input(s): VITAMINB12, FOLATE, FERRITIN, TIBC, IRON, RETICCTPCT in the last 72 hours. Sepsis Labs: No results for input(s): PROCALCITON, LATICACIDVEN in the last 168 hours.  No results found for this or any previous visit (from the past 240 hour(s)).       Radiology Studies: No results found.      Scheduled Meds: . amLODipine  10 mg Per Tube Daily  . chlorhexidine  15 mL Mouth Rinse BID  . cholecalciferol  1,000 Units Oral Daily  . dextrose  1 Tube Oral Once  . dextrose  25 g Intravenous Once  . enoxaparin (LOVENOX) injection  40 mg Subcutaneous Daily  . feeding supplement (JEVITY 1.2 CAL)  474 mL Per Tube 4x daily  . feeding supplement (PRO-STAT SUGAR FREE 64)   30 mL Per Tube QID  . folic acid  1 mg Per Tube Daily  . free water  300 mL Per Tube Q4H  . hydrALAZINE  25 mg Per Tube Q8H  . insulin aspart  0-15 Units Subcutaneous Q4H  . levETIRAcetam  500 mg Per Tube BID  . mouth rinse  15 mL Mouth Rinse q12n4p  . metoprolol tartrate  25 mg Per Tube BID  . multivitamin  15 mL Per Tube Daily  . QUEtiapine  50 mg Per Tube QHS  . thiamine  100 mg Per Tube Daily   Continuous Infusions:   LOS: 154 days    Lucky Cowboy, MD  02/10/2020, 3:06 PM

## 2020-02-11 DIAGNOSIS — I611 Nontraumatic intracerebral hemorrhage in hemisphere, cortical: Secondary | ICD-10-CM | POA: Diagnosis not present

## 2020-02-11 NOTE — Progress Notes (Signed)
Providing care is a challenge pt has been refusing mouth care  Several attempt by nurse not quiet successfu

## 2020-02-11 NOTE — Progress Notes (Deleted)
Pt was found almost out of bed  Confused not following command ,pulled out IV line and  Blood noted on  bed linens. Assisted back to bed with 3 persons assist  Calmed down and bil hand mitten applied.  Pt cleaned and made comfortable in bed. Noted pt started singing. When offered po medications pt was not able to swallow  ,A  half teaspoon of  apple sauce given as a  trial before given the medication had to be  suctioned out from the mouth.Pt. was not following command to swallow..  Pt. kept NPO and Team MD and neurologist on call  made aware.   BP slightly high with SBP in 190's. MD made aware. RN will continue to monitor pt closely. Fall and Aspiration  precautions maintain,

## 2020-02-11 NOTE — Progress Notes (Signed)
PROGRESS NOTE    Joshua [name]  [id number] DOB: [birth year] DOA: 09/09/2019 PCP: Patient, [name]    Brief Narrative:  Patient is a 39 year old African-American male admitted by neurosurgery on 09/09/2019 for seizure, right hemiplegia and decreased responsiveness, found to have left hemispheric intracranial hemorrhage, intubated and admitted to ICU.  Patientunderwent craniotomy for decompression on 09/09/2019 by Dr. Vertell Limber, patient showed slow neurological recovery and prolonged hospitalization due to seizure and aspiration pneumonia. Patient continued to be awake but significant dysphagia, PEG tube was placed on 09/25/2019. Placement has been challenging as patient is uninsured and needs long-term care. Patient vomited tube feeding contents on the night of 01/06/2019. CT abdomen was negative for obstruction. Currently patient is tolerating tube feedings. Awaiting placement.  Assessment & Plan:   Principal Problem:   ICH (intracerebral hemorrhage) (HCC) Active Problems:   Subdural hematoma (HCC)   Intracerebral hemorrhage (HCC)   Cytotoxic brain edema (HCC)   Hemorrhagic stroke (HCC)   Acute respiratory failure ([place])   Essential hypertension   Severe protein-calorie malnutrition (HCC)   CKD (chronic kidney disease), stage IV (HCC)   Seizure (HCC)   AKI (acute kidney injury) ([place])  Left intracranial hemorrhage: -Likely secondary to uncontrolled hypertension -Patient is status post decompressive craniectomy and hematoma evacuation 09/09/2019. -CT head no vascular malformation. Dr. Vertell Limber did not recommend replacement of the skull bone with lack of progression. -Patient remains aphasic with right hemiplegia and severe cognitive impairment. -Patient continues to refuse care and exam.  Seizure disorder: -On Keppra. -Patient resists and refuses care and medications. -No recent seizure activity.  Klebsiella UTI: -Treated with cefazolin.  Hypertension: -Blood pressures  currently controlled. Continue Norvasc, hydralazine and metoprolol.  Aspiration pneumonia: -Patient has completed course of antibiotics.  Nutrition: -Patient is status post PEG tube placement.  Patient is on tube feeds.  Acute kidney injury versus acute kidney injury on CKD stage III: -Last renal panel was done on 01/17/2020. -Serum creatinine was 1.23 on 01/17/2020.  Hypoglycemia: Resolved.  Nutrition Problem: Severe Malnutrition Etiology: acute illness(intracerebral hemorrage)  Signs/Symptoms: severe fat depletion, severe muscle depletion  Interventions: Tube feeding, Refer to RD note for recommendations  Estimated body mass index is 18.88 kg/m as calculated from the following:   Height as of this encounter: 5\' 9"  (1.753 m).   Weight as of this encounter: 58 kg.  DVT prophylaxis:Lovenox  Code Status:Full code Family Communication: Disposition Plan:Await SNF placement   Consultants:  Neurosurgery  Procedures:Decompressive craniectomy and hematoma evacuation and implantation of skull flap in the right abdomen  Antimicrobials:none   Subjective: -No history from patient. -Patient continues to resist medical examination.  Objective: Vitals:   02/10/20 2353 02/11/20 0341 02/11/20 0800 02/11/20 1220  BP: 122/89 121/82 117/86 (!) 131/93  Pulse: (!) 105 99 96 89  Resp: 16 16 19 19   Temp: 98.1 F (36.7 C) 98.9 F (37.2 C) 98.2 F (36.8 C) 98 F (36.7 C)  TempSrc:   Oral Oral  SpO2: 100% 100% 99% 100%  Weight:      Height:        Intake/Output Summary (Last 24 hours) at 02/11/2020 1354 Last data filed at 02/10/2020 1732 Gross per 24 hour  Intake 774 ml  Output --  Net 774 ml   Filed Weights   02/03/20 0500 02/04/20 0500 02/08/20 0500  Weight: 46 kg 47 kg 58 kg    Examination:  General exam: Appears calm and comfortable  Spastic in RUE and RLE  Data Reviewed: I have personally  reviewed following labs and imaging studies  CBC: No results  for input(s): WBC, NEUTROABS, HGB, HCT, MCV, PLT in the last 168 hours. Basic Metabolic Panel: No results for input(s): NA, K, CL, CO2, GLUCOSE, BUN, CREATININE, CALCIUM, MG, PHOS in the last 168 hours. GFR: CrCl cannot be calculated (Patient's most recent lab result is older than the maximum 21 days allowed.). Liver Function Tests: No results for input(s): AST, ALT, ALKPHOS, BILITOT, PROT, ALBUMIN in the last 168 hours. No results for input(s): LIPASE, AMYLASE in the last 168 hours. No results for input(s): AMMONIA in the last 168 hours. Coagulation Profile: No results for input(s): INR, PROTIME in the last 168 hours. Cardiac Enzymes: No results for input(s): CKTOTAL, CKMB, CKMBINDEX, TROPONINI in the last 168 hours. BNP (last 3 results) No results for input(s): PROBNP in the last 8760 hours. HbA1C: No results for input(s): HGBA1C in the last 72 hours. CBG: Recent Labs  Lab 02/09/20 0742 02/09/20 1136 02/09/20 1530 02/09/20 2040 02/09/20 2349  GLUCAP 85 78 145* 110* 100*   Lipid Profile: No results for input(s): CHOL, HDL, LDLCALC, TRIG, CHOLHDL, LDLDIRECT in the last 72 hours. Thyroid Function Tests: No results for input(s): TSH, T4TOTAL, FREET4, T3FREE, THYROIDAB in the last 72 hours. Anemia Panel: No results for input(s): VITAMINB12, FOLATE, FERRITIN, TIBC, IRON, RETICCTPCT in the last 72 hours. Sepsis Labs: No results for input(s): PROCALCITON, LATICACIDVEN in the last 168 hours.  No results found for this or any previous visit (from the past 240 hour(s)).       Radiology Studies: No results found.      Scheduled Meds: . amLODipine  10 mg Per Tube Daily  . chlorhexidine  15 mL Mouth Rinse BID  . cholecalciferol  1,000 Units Oral Daily  . dextrose  1 Tube Oral Once  . dextrose  25 g Intravenous Once  . enoxaparin (LOVENOX) injection  40 mg Subcutaneous Daily  . feeding supplement (JEVITY 1.2 CAL)  474 mL Per Tube 4x daily  . feeding supplement (PRO-STAT  SUGAR FREE 64)  30 mL Per Tube QID  . folic acid  1 mg Per Tube Daily  . free water  300 mL Per Tube Q4H  . hydrALAZINE  25 mg Per Tube Q8H  . insulin aspart  0-15 Units Subcutaneous Q4H  . levETIRAcetam  500 mg Per Tube BID  . mouth rinse  15 mL Mouth Rinse q12n4p  . metoprolol tartrate  25 mg Per Tube BID  . multivitamin  15 mL Per Tube Daily  . QUEtiapine  50 mg Per Tube QHS  . thiamine  100 mg Per Tube Daily   Continuous Infusions:   LOS: 155 days    Lucky Cowboy, MD  02/11/2020, 1:54 PM

## 2020-02-12 DIAGNOSIS — I612 Nontraumatic intracerebral hemorrhage in hemisphere, unspecified: Secondary | ICD-10-CM | POA: Diagnosis not present

## 2020-02-12 DIAGNOSIS — N179 Acute kidney failure, unspecified: Secondary | ICD-10-CM | POA: Diagnosis not present

## 2020-02-12 DIAGNOSIS — N184 Chronic kidney disease, stage 4 (severe): Secondary | ICD-10-CM | POA: Diagnosis not present

## 2020-02-12 DIAGNOSIS — I10 Essential (primary) hypertension: Secondary | ICD-10-CM | POA: Diagnosis not present

## 2020-02-12 LAB — GLUCOSE, CAPILLARY
Glucose-Capillary: 106 mg/dL — ABNORMAL HIGH (ref 70–99)
Glucose-Capillary: 110 mg/dL — ABNORMAL HIGH (ref 70–99)
Glucose-Capillary: 112 mg/dL — ABNORMAL HIGH (ref 70–99)
Glucose-Capillary: 114 mg/dL — ABNORMAL HIGH (ref 70–99)
Glucose-Capillary: 119 mg/dL — ABNORMAL HIGH (ref 70–99)
Glucose-Capillary: 121 mg/dL — ABNORMAL HIGH (ref 70–99)
Glucose-Capillary: 126 mg/dL — ABNORMAL HIGH (ref 70–99)
Glucose-Capillary: 127 mg/dL — ABNORMAL HIGH (ref 70–99)
Glucose-Capillary: 130 mg/dL — ABNORMAL HIGH (ref 70–99)
Glucose-Capillary: 134 mg/dL — ABNORMAL HIGH (ref 70–99)
Glucose-Capillary: 140 mg/dL — ABNORMAL HIGH (ref 70–99)
Glucose-Capillary: 181 mg/dL — ABNORMAL HIGH (ref 70–99)
Glucose-Capillary: 68 mg/dL — ABNORMAL LOW (ref 70–99)
Glucose-Capillary: 84 mg/dL (ref 70–99)
Glucose-Capillary: 87 mg/dL (ref 70–99)
Glucose-Capillary: 88 mg/dL (ref 70–99)
Glucose-Capillary: 91 mg/dL (ref 70–99)
Glucose-Capillary: 91 mg/dL (ref 70–99)
Glucose-Capillary: 92 mg/dL (ref 70–99)
Glucose-Capillary: 93 mg/dL (ref 70–99)

## 2020-02-12 NOTE — Progress Notes (Signed)
[facility]  PROGRESS NOTE  Kanton Bettin [id number] DOB: 23-May-1981 DOA: 09/09/2019 PCP: Patient, [name]   Brief HPI:   39 year old African-American male admitted by neurosurgery on 09/09/2023 seizure, right hemiplegia and decreased responsiveness found to have left hemispheric intracranial hemorrhage, intubated admitted to ICU.  Patient underwent craniotomy for decompression on 09/09/2019 by Dr. Vertell Limber, patient showed slow neurological recovery and prolonged hospitalization due to seizure and aspiration pneumonia.  Patient continued to be awake but significant dysphagia, PEG tube was placed on 09/25/2019.  Placement has been challenging as patient is uninsured and needs long-term care.  Patient vomited tube feeding contents on the night of 01/06/2019.  CT abdomen pelvis was negative for obstruction.  Currently tolerating tube feeds well.  Awaiting placement.    Subjective   Patient seen and examined, nonverbal, denies any complaints.   Assessment/Plan:     1. Left intracranial hemorrhage-likely from uncontrolled hypertension, status post decompressive craniectomy and hematoma evacuation on 09/09/2019.  CT head showed no vascular malformation.  Dr. Joaquim Lai did not recommend replacement of skull bone with lack of progression.  Patient remains aphasic with right hemiplegia and severe cognitive impairment. 2. Seizure disorder-continue Keppra, patient has been refusing medications.  No recent seizure activity. 3. Klebsiella UTI-treated with cefazolin 4. Hypertension-blood pressure is controlled, continue Norvasc, hydralazine, metoprolol. 5. Aspiration pneumonia-completed course of antibiotics. 6. Nutrition-patient status post PEG tube placement, on tube feeding. 7. CKD stage III-patient's creatinine was 1.23 as of 01/17/2020.  Will repeat BMP in a.m.     SpO2: 100 % O2 Flow Rate (L/min): 0 L/min FiO2 (%): 40 %   COVID-19 Labs  No results for input(s): DDIMER, FERRITIN, LDH, CRP  in the last 72 hours.  Lab Results  Component Value Date   [place] NEGATIVE 09/09/2019     CBG: Recent Labs  Lab 02/11/20 2036 02/12/20 0002 02/12/20 0334 02/12/20 0807 02/12/20 1137  GLUCAP 106* 181* 84 88 134*         DVT prophylaxis: Lovenox  Code Status: Full code  Family Communication: No family at bedside  Disposition Plan: Awaiting skilled nursing facility placement         Scheduled medications:  . amLODipine  10 mg Per Tube Daily  . chlorhexidine  15 mL Mouth Rinse BID  . cholecalciferol  1,000 Units Oral Daily  . dextrose  1 Tube Oral Once  . dextrose  25 g Intravenous Once  . enoxaparin (LOVENOX) injection  40 mg Subcutaneous Daily  . feeding supplement (JEVITY 1.2 CAL)  474 mL Per Tube 4x daily  . feeding supplement (PRO-STAT SUGAR FREE 64)  30 mL Per Tube QID  . folic acid  1 mg Per Tube Daily  . free water  300 mL Per Tube Q4H  . hydrALAZINE  25 mg Per Tube Q8H  . insulin aspart  0-15 Units Subcutaneous Q4H  . levETIRAcetam  500 mg Per Tube BID  . mouth rinse  15 mL Mouth Rinse q12n4p  . metoprolol tartrate  25 mg Per Tube BID  . multivitamin  15 mL Per Tube Daily  . QUEtiapine  50 mg Per Tube QHS  . thiamine  100 mg Per Tube Daily    Consultants:  Neurosurgery  Procedures:  Decompressive craniectomy and hematoma evacuation and implantation of skull flap in the right abdomen  Antibiotics:   Anti-infectives (From admission, onward)   Start     Dose/Rate Route Frequency Ordered Stop   09/27/19 1130  cephALEXin (KEFLEX) 250 MG/5ML  suspension 500 mg     500 mg Per Tube Every 12 hours 09/27/19 1052 09/29/19 2250   09/25/19 1445  ceFAZolin (ANCEF) IVPB 2g/100 mL premix  Status:  Discontinued     2 g 200 mL/hr over 30 Minutes Intravenous Every 12 hours 09/25/19 1417 09/27/19 1052   09/14/19 1000  Ampicillin-Sulbactam (UNASYN) 3 g in sodium chloride 0.9 % 100 mL IVPB     3 g 200 mL/hr over 30 Minutes Intravenous Every 8 hours  09/14/19 0922 09/17/19 1816   09/13/19 1100  vancomycin (VANCOCIN) IVPB 750 mg/150 ml premix  Status:  Discontinued     750 mg 150 mL/hr over 60 Minutes Intravenous Every 24 hours 09/13/19 0728 09/13/19 0838   09/12/19 1100  vancomycin (VANCOCIN) IVPB 1000 mg/200 mL premix  Status:  Discontinued     1,000 mg 200 mL/hr over 60 Minutes Intravenous Every 24 hours 09/11/19 1034 09/13/19 0728   09/11/19 1130  piperacillin-tazobactam (ZOSYN) IVPB 3.375 g  Status:  Discontinued     3.375 g 12.5 mL/hr over 240 Minutes Intravenous Every 8 hours 09/11/19 1054 09/14/19 0845   09/11/19 1100  vancomycin (VANCOCIN) 1,500 mg in sodium chloride 0.9 % 500 mL IVPB     1,500 mg 250 mL/hr over 120 Minutes Intravenous  Once 09/11/19 1034 09/11/19 1900   09/11/19 1015  piperacillin-tazobactam (ZOSYN) IVPB 4.5 g  Status:  Discontinued     4.5 g 200 mL/hr over 30 Minutes Intravenous Every 8 hours 09/11/19 1010 09/11/19 1052   09/09/19 0845  ceFAZolin (ANCEF) IVPB 2g/100 mL premix     2 g 200 mL/hr over 30 Minutes Intravenous Every 8 hours 09/09/19 0842 09/09/19 1658       Objective   Vitals:   02/12/20 0001 02/12/20 0344 02/12/20 0859 02/12/20 1140  BP: (!) 138/96 118/87 (!) 157/116 (!) 149/84  Pulse: 94 89 91 86  Resp: 18 19  14   Temp: 98 F (36.7 C) 99.2 F (37.3 C) (!) 97.4 F (36.3 C) 97.8 F (36.6 C)  TempSrc: Oral Oral Oral Oral  SpO2: 99% 100% 100% 100%  Weight:      Height:       No intake or output data in the 24 hours ending 02/12/20 1355  No intake/output data recorded.  Filed Weights   02/03/20 0500 02/04/20 0500 02/08/20 0500  Weight: 46 kg 47 kg 58 kg    Physical Examination:    General: Nonverbal,  Cardiovascular: S1-S2, regular, no murmur auscultated  Respiratory: Clear to auscultation bilaterally, no wheezing or crackles  Abdomen: Abdomen is soft, nontender, no organomegaly  Extremities: No edema in the lower extremities  Neurologic: Spastic right upper  extremity and right lower extremity.     Admission status: Inpatient: Based on patients clinical presentation and evaluation of above clinical data, I have made determination that patient meets Inpatient criteria at this time.   Oswald Hillock   [facility] If 7PM-7AM, please contact night-coverage at [email or web address], Office  [phone]  password [email or web address]  02/12/2020, 1:55 PM  LOS: 156 days

## 2020-02-13 DIAGNOSIS — J9601 Acute respiratory failure with hypoxia: Secondary | ICD-10-CM | POA: Diagnosis not present

## 2020-02-13 DIAGNOSIS — N179 Acute kidney failure, unspecified: Secondary | ICD-10-CM | POA: Diagnosis not present

## 2020-02-13 LAB — GLUCOSE, CAPILLARY
Glucose-Capillary: 98 mg/dL (ref 70–99)
Glucose-Capillary: 104 mg/dL — ABNORMAL HIGH (ref 70–99)
Glucose-Capillary: 88 mg/dL (ref 70–99)
Glucose-Capillary: 145 mg/dL — ABNORMAL HIGH (ref 70–99)
Glucose-Capillary: 85 mg/dL (ref 70–99)

## 2020-02-13 NOTE — Progress Notes (Signed)
[facility]  PROGRESS NOTE  Joshua [name] [id number] DOB: [birth year] DOA: 09/09/2019 PCP: Patient, [name]   Brief HPI:   39 year old African-American male admitted by neurosurgery on 09/09/2023 seizure, right hemiplegia and decreased responsiveness found to have left hemispheric intracranial hemorrhage, intubated admitted to ICU.  Patient underwent craniotomy for decompression on 09/09/2019 by Dr. Vertell Limber, patient showed slow neurological recovery and prolonged hospitalization due to seizure and aspiration pneumonia.  Patient continued to be awake but significant dysphagia, PEG tube was placed on 09/25/2019.  Placement has been challenging as patient is uninsured and needs long-term care.  Patient vomited tube feeding contents on the night of 01/06/2019.  CT abdomen pelvis was negative for obstruction.  Currently tolerating tube feeds well.  Awaiting placement.    Subjective   Patient see, refusing to be examined.   Assessment/Plan:     1. Left intracranial hemorrhage-likely from uncontrolled hypertension, status post decompressive craniectomy and hematoma evacuation on 09/09/2019.  CT head showed no vascular malformation.  Dr. Joaquim Lai did not recommend replacement of skull bone with lack of progression.  Patient remains aphasic with right hemiplegia and severe cognitive impairment. 2. Seizure disorder-continue Keppra, patient has been refusing medications.  No recent seizure activity. 3. Klebsiella UTI-treated with cefazolin 4. Hypertension-blood pressure is controlled, continue Norvasc, hydralazine, metoprolol. 5. Aspiration pneumonia-completed course of antibiotics. 6. Nutrition-patient status post PEG tube placement, on tube feeding. 7. CKD stage III-patient's creatinine was 1.23 as of 01/17/2020.  Will repeat BMP in a.m.     SpO2: 99 % O2 Flow Rate (L/min): 0 L/min FiO2 (%): 40 %   COVID-19 Labs  No results for input(s): DDIMER, FERRITIN, LDH, CRP in the last 72  hours.  Lab Results  Component Value Date   [place] NEGATIVE 09/09/2019     CBG: Recent Labs  Lab 02/12/20 1559 02/12/20 2032 02/12/20 2338 02/13/20 0324 02/13/20 0729  GLUCAP 93 112* 127* 98 104*         DVT prophylaxis: Lovenox  Code Status: Full code  Family Communication: No family at bedside  Disposition Plan: Awaiting skilled nursing facility placement         Scheduled medications:  . amLODipine  10 mg Per Tube Daily  . chlorhexidine  15 mL Mouth Rinse BID  . cholecalciferol  1,000 Units Oral Daily  . dextrose  1 Tube Oral Once  . dextrose  25 g Intravenous Once  . enoxaparin (LOVENOX) injection  40 mg Subcutaneous Daily  . feeding supplement (JEVITY 1.2 CAL)  474 mL Per Tube 4x daily  . feeding supplement (PRO-STAT SUGAR FREE 64)  30 mL Per Tube QID  . folic acid  1 mg Per Tube Daily  . free water  300 mL Per Tube Q4H  . hydrALAZINE  25 mg Per Tube Q8H  . insulin aspart  0-15 Units Subcutaneous Q4H  . levETIRAcetam  500 mg Per Tube BID  . mouth rinse  15 mL Mouth Rinse q12n4p  . metoprolol tartrate  25 mg Per Tube BID  . multivitamin  15 mL Per Tube Daily  . QUEtiapine  50 mg Per Tube QHS  . thiamine  100 mg Per Tube Daily    Consultants:  Neurosurgery  Procedures:  Decompressive craniectomy and hematoma evacuation and implantation of skull flap in the right abdomen  Antibiotics:   Anti-infectives (From admission, onward)   Start     Dose/Rate Route Frequency Ordered Stop   09/27/19 1130  cephALEXin (KEFLEX) 250 MG/5ML suspension 500  mg     500 mg Per Tube Every 12 hours 09/27/19 1052 09/29/19 2250   09/25/19 1445  ceFAZolin (ANCEF) IVPB 2g/100 mL premix  Status:  Discontinued     2 g 200 mL/hr over 30 Minutes Intravenous Every 12 hours 09/25/19 1417 09/27/19 1052   09/14/19 1000  Ampicillin-Sulbactam (UNASYN) 3 g in sodium chloride 0.9 % 100 mL IVPB     3 g 200 mL/hr over 30 Minutes Intravenous Every 8 hours 09/14/19 0922  09/17/19 1816   09/13/19 1100  vancomycin (VANCOCIN) IVPB 750 mg/150 ml premix  Status:  Discontinued     750 mg 150 mL/hr over 60 Minutes Intravenous Every 24 hours 09/13/19 0728 09/13/19 0838   09/12/19 1100  vancomycin (VANCOCIN) IVPB 1000 mg/200 mL premix  Status:  Discontinued     1,000 mg 200 mL/hr over 60 Minutes Intravenous Every 24 hours 09/11/19 1034 09/13/19 0728   09/11/19 1130  piperacillin-tazobactam (ZOSYN) IVPB 3.375 g  Status:  Discontinued     3.375 g 12.5 mL/hr over 240 Minutes Intravenous Every 8 hours 09/11/19 1054 09/14/19 0845   09/11/19 1100  vancomycin (VANCOCIN) 1,500 mg in sodium chloride 0.9 % 500 mL IVPB     1,500 mg 250 mL/hr over 120 Minutes Intravenous  Once 09/11/19 1034 09/11/19 1900   09/11/19 1015  piperacillin-tazobactam (ZOSYN) IVPB 4.5 g  Status:  Discontinued     4.5 g 200 mL/hr over 30 Minutes Intravenous Every 8 hours 09/11/19 1010 09/11/19 1052   09/09/19 0845  ceFAZolin (ANCEF) IVPB 2g/100 mL premix     2 g 200 mL/hr over 30 Minutes Intravenous Every 8 hours 09/09/19 0842 09/09/19 1658       Objective   Vitals:   02/12/20 2343 02/13/20 0343 02/13/20 0500 02/13/20 0727  BP: (!) 125/91 (!) 124/98  (!) 127/106  Pulse: (!) 104 84  88  Resp: 18 16  17   Temp: 98.7 F (37.1 C) 97.7 F (36.5 C)  98.4 F (36.9 C)  TempSrc: Oral Oral  Oral  SpO2: 98% 100%  99%  Weight:   58.2 kg   Height:       No intake or output data in the 24 hours ending 02/13/20 1042  No intake/output data recorded.  Filed Weights   02/04/20 0500 02/08/20 0500 02/13/20 0500  Weight: 47 kg 58 kg 58.2 kg    Physical Examination:    Alert, refusing examination     Admission status: Inpatient: Based on patients clinical presentation and evaluation of above clinical data, I have made determination that patient meets Inpatient criteria at this time.   Oswald Hillock   [facility] If 7PM-7AM, please contact night-coverage at [email or web address], Office   [phone]  password [email or web address]  02/13/2020, 10:42 AM  LOS: 157 days

## 2020-02-14 DIAGNOSIS — I611 Nontraumatic intracerebral hemorrhage in hemisphere, cortical: Secondary | ICD-10-CM | POA: Diagnosis not present

## 2020-02-14 DIAGNOSIS — N184 Chronic kidney disease, stage 4 (severe): Secondary | ICD-10-CM | POA: Diagnosis not present

## 2020-02-14 DIAGNOSIS — I10 Essential (primary) hypertension: Secondary | ICD-10-CM | POA: Diagnosis not present

## 2020-02-14 DIAGNOSIS — G936 Cerebral edema: Secondary | ICD-10-CM | POA: Diagnosis not present

## 2020-02-14 LAB — GLUCOSE, CAPILLARY
Glucose-Capillary: 103 mg/dL — ABNORMAL HIGH (ref 70–99)
Glucose-Capillary: 116 mg/dL — ABNORMAL HIGH (ref 70–99)
Glucose-Capillary: 132 mg/dL — ABNORMAL HIGH (ref 70–99)
Glucose-Capillary: 88 mg/dL (ref 70–99)
Glucose-Capillary: 94 mg/dL (ref 70–99)
Glucose-Capillary: 95 mg/dL (ref 70–99)

## 2020-02-14 MED ORDER — FREE WATER
200.0000 mL | Status: DC
  Administered 2020-02-14 – 2020-05-02 (×408): 200 mL

## 2020-02-14 NOTE — Progress Notes (Signed)
PROGRESS NOTE  Joshua [name] [id number] DOB: [birth year] DOA: 09/09/2019 PCP: Patient, [name]  Brief History   39 year old African-American male admitted by neurosurgery on 09/09/2023 seizure, right hemiplegia and decreased responsiveness found to have left hemispheric intracranial hemorrhage, intubated admitted to ICU.  Patient underwent craniotomy for decompression on 09/09/2019 by Dr. Vertell Limber, patient showed slow neurological recovery and prolonged hospitalization due to seizure and aspiration pneumonia.  Patient continued to be awake but significant dysphagia, PEG tube was placed on 09/25/2019.  Placement has been challenging as patient is uninsured and needs long-term care.  Patient vomited tube feeding contents on the night of 01/06/2019.  CT abdomen pelvis was negative for obstruction.  Currently tolerating tube feeds well.  Awaiting placement.  Consultants  . [facility] . PCCM . Neurosurgery . neurology  Procedures  . Intubation . Extubation . Mechanical ventilation . Craniotomy . PEG tube placement . Tube feeds  Antibiotics   Anti-infectives (From admission, onward)   Start     Dose/Rate Route Frequency Ordered Stop   09/27/19 1130  cephALEXin (KEFLEX) 250 MG/5ML suspension 500 mg     500 mg Per Tube Every 12 hours 09/27/19 1052 09/29/19 2250   09/25/19 1445  ceFAZolin (ANCEF) IVPB 2g/100 mL premix  Status:  Discontinued     2 g 200 mL/hr over 30 Minutes Intravenous Every 12 hours 09/25/19 1417 09/27/19 1052   09/14/19 1000  Ampicillin-Sulbactam (UNASYN) 3 g in sodium chloride 0.9 % 100 mL IVPB     3 g 200 mL/hr over 30 Minutes Intravenous Every 8 hours 09/14/19 0922 09/17/19 1816   09/13/19 1100  vancomycin (VANCOCIN) IVPB 750 mg/150 ml premix  Status:  Discontinued     750 mg 150 mL/hr over 60 Minutes Intravenous Every 24 hours 09/13/19 0728 09/13/19 0838   09/12/19 1100  vancomycin (VANCOCIN) IVPB 1000 mg/200 mL premix  Status:  Discontinued     1,000 mg 200 mL/hr over  60 Minutes Intravenous Every 24 hours 09/11/19 1034 09/13/19 0728   09/11/19 1130  piperacillin-tazobactam (ZOSYN) IVPB 3.375 g  Status:  Discontinued     3.375 g 12.5 mL/hr over 240 Minutes Intravenous Every 8 hours 09/11/19 1054 09/14/19 0845   09/11/19 1100  vancomycin (VANCOCIN) 1,500 mg in sodium chloride 0.9 % 500 mL IVPB     1,500 mg 250 mL/hr over 120 Minutes Intravenous  Once 09/11/19 1034 09/11/19 1900   09/11/19 1015  piperacillin-tazobactam (ZOSYN) IVPB 4.5 g  Status:  Discontinued     4.5 g 200 mL/hr over 30 Minutes Intravenous Every 8 hours 09/11/19 1010 09/11/19 1052   09/09/19 0845  ceFAZolin (ANCEF) IVPB 2g/100 mL premix     2 g 200 mL/hr over 30 Minutes Intravenous Every 8 hours 09/09/19 0842 09/09/19 1658    .  Subjective  The patient is lying in bed. He is not verbal. He slaps my hands away when I try to examine him.  Objective   Vitals:  Vitals:   02/14/20 1103 02/14/20 1619  BP: (!) 138/96 (!) 122/97  Pulse: (!) 108 89  Resp:  15  Temp:  98.2 F (36.8 C)  SpO2:  99%    Exam:  Constitutional:  . Appears calm and comfortable until I attempt to examine him. Eyes:  . pupils and irises appear normal . Normal lids and conjunctivae Neck:  neck appears normal, no masses, normal ROM,  Respiratory:  . Respiratory effort normal. No retractions or accessory muscle use Cardiovascular:  . Pulses appear  normal Abdomen:  Abdomen appears normal;  Musculoskeletal:  . Digits/nails BUE: no clubbing, cyanosis, petechiae, infection . No edema apparent Skin:  . No rashes, lesions, ulcers visualized. Neurologic:  . CN 2-12 intact . Sensation all 4 extremities intact . Patient is moving all extremities. Psychiatric:  . Mental status: Oppositional.  I have personally reviewed the following:   Today's Data  . Vitals  Scheduled Meds: . amLODipine  10 mg Per Tube Daily  . chlorhexidine  15 mL Mouth Rinse BID  . cholecalciferol  1,000 Units Oral Daily  .  dextrose  1 Tube Oral Once  . dextrose  25 g Intravenous Once  . enoxaparin (LOVENOX) injection  40 mg Subcutaneous Daily  . feeding supplement (JEVITY 1.2 CAL)  474 mL Per Tube 4x daily  . feeding supplement (PRO-STAT SUGAR FREE 64)  30 mL Per Tube QID  . folic acid  1 mg Per Tube Daily  . free water  200 mL Per Tube Q4H  . hydrALAZINE  25 mg Per Tube Q8H  . insulin aspart  0-15 Units Subcutaneous Q4H  . levETIRAcetam  500 mg Per Tube BID  . mouth rinse  15 mL Mouth Rinse q12n4p  . metoprolol tartrate  25 mg Per Tube BID  . multivitamin  15 mL Per Tube Daily  . QUEtiapine  50 mg Per Tube QHS  . thiamine  100 mg Per Tube Daily   Continuous Infusions:  Principal Problem:   ICH (intracerebral hemorrhage) (HCC) Active Problems:   Subdural hematoma (HCC)   Intracerebral hemorrhage (HCC)   Cytotoxic brain edema (HCC)   Hemorrhagic stroke (HCC)   Acute respiratory failure ([place])   Essential hypertension   Severe protein-calorie malnutrition ([place])   CKD (chronic kidney disease), stage IV (HCC)   Seizure ([place])   AKI (acute kidney injury) ([place])   LOS: 158 days   A & P  Left intracranial hemorrhage-likely from uncontrolled hypertension, status post decompressive craniectomy and hematoma evacuation on 09/09/2019.  CT head showed no vascular malformation.  Dr. Joaquim Lai did not recommend replacement of skull bone with lack of progression. Patient remains aphasic with right hemiplegia and severe cognitive impairment.  Seizure disorder-continue Keppra, patient has been refusing medications.  No recent seizure activity.  Klebsiella UTI-treated with cefazolin  Hypertension-blood pressure is controlled, continue Norvasc, hydralazine, metoprolol.  Aspiration pneumonia-completed course of antibiotics.  Nutrition-patient status post PEG tube placement, on tube feeding.  CKD stage III-patient's creatinine was 1.23 as of 01/17/2020.  Will repeat BMP in a.m.  I have seen and examined this patient  myself. I have spent 28 minutes in his evaluation and car.  DVT Prophylaxis: Lovenox CODE STATUS: Full Code Family Communication: None available Disposition: SNF. Pt homeless  Iolanda Folson, DO [facility] Direct contact: see [email or web address]  7PM-7AM contact night coverage as above 02/14/2020, 7:40 PM  LOS: 158 days

## 2020-02-14 NOTE — Progress Notes (Signed)
Nutrition Follow-up  RD working remotely.   DOCUMENTATION CODES:   Severe malnutrition in context of acute illness/injury  INTERVENTION:  ContinueJevity 1.2 formul[place] PEG at goal volume of 474 ml (2 cartons/ARCs) given QID.   Provide 30 ml ProstatQID per tube.   Free water flushes of 200 ml every 4 hours per tube. (MD to adjust as appropriate)  Tube feeding to provide 2675 kcal, 165grams of protein, and 2730m free water.  Continue PO intake for comfort as appropriate/tolerated.   NUTRITION DIAGNOSIS:   Severe Malnutrition related to acute illness(intracerebral hemorrage) as evidenced by severe fat depletion, severe muscle depletion.  Ongoing.  GOAL:   Patient will meet greater than or equal to 90% of their needs  Met with tube feeding.  MONITOR:   TF tolerance, Skin, Weight trends, Labs, I & O's  REASON FOR ASSESSMENT:   Ventilator, Consult Enteral/tube feeding initiation and management  ASSESSMENT:   39yo male admitted with large left intracranial hemorrhage; S/P craniotomy. PMH includes HTN, alcoholism, noncompliance.  9/12left frontal temporal craniectomy and evacuation hematoma.  9/23 Extubated.  10/1 PEG placed 11/23- Nutrition Focused Physical Exam performed, severe subcutaneous fat loss and severe muscle wasting identified.  Pt noted to refuse exams, labs, and medications at times. Discussed pt with RN who reports pt is accepting bolus feeding.   Current tube feeding regimen: 4725mJevity 1.2 QID, 3067mro-stat QID, 300m28mee water Q4h  Medications reviewed and include: Vitamin D3, Glutose, Folvite, SSI, MVI  Labs reviewed (none since 01/17/20) CBGs 94-103    Diet Order:   Diet Order            Diet regular Room service appropriate? Yes; Fluid consistency: Thin  Diet effective now              EDUCATION NEEDS:   Not appropriate for education at this time  Skin:  Skin Assessment: Skin Integrity Issues: Skin Integrity  Issues:: Other (Comment) Other: skin tear right thigh  Last BM:  02/12/20  Height:   Ht Readings from Last 1 Encounters:  09/09/19 _0  (1.753 m)    Weight:   Wt Readings from Last 1 Encounters:  02/14/20 57 kg    Ideal Body Weight:  72.7 kg  BMI:  Body mass index is 18.56 kg/m.  Estimated Nutritional Needs:   Kcal:  [id number]:  130-170 grams  Fluid:  >/= 2 L/day   AmanLarkin Ina, RD, LDN RD pager number and weekend/on-call pager number located in [place]

## 2020-02-14 NOTE — Progress Notes (Addendum)
Pt allowed RN to give meds via PEG tube this morning, but would not allow RN to admin Lovenox injection. Pt also physically refusing lab to draw labs this morning, CNA tried to assist and he still refused.

## 2020-02-15 DIAGNOSIS — I10 Essential (primary) hypertension: Secondary | ICD-10-CM | POA: Diagnosis not present

## 2020-02-15 DIAGNOSIS — I611 Nontraumatic intracerebral hemorrhage in hemisphere, cortical: Secondary | ICD-10-CM | POA: Diagnosis not present

## 2020-02-15 DIAGNOSIS — N184 Chronic kidney disease, stage 4 (severe): Secondary | ICD-10-CM | POA: Diagnosis not present

## 2020-02-15 DIAGNOSIS — G936 Cerebral edema: Secondary | ICD-10-CM | POA: Diagnosis not present

## 2020-02-15 LAB — GLUCOSE, CAPILLARY
Glucose-Capillary: 92 mg/dL (ref 70–99)
Glucose-Capillary: 111 mg/dL — ABNORMAL HIGH (ref 70–99)
Glucose-Capillary: 96 mg/dL (ref 70–99)
Glucose-Capillary: 97 mg/dL (ref 70–99)

## 2020-02-15 NOTE — Progress Notes (Signed)
PROGRESS NOTE  Adnaan Mowad [id number] DOB: May 29, 1981 DOA: 09/09/2019 PCP: Patient, [name]  Brief History   39 year old African-American male admitted by neurosurgery on 09/09/2023 seizure, right hemiplegia and decreased responsiveness found to have left hemispheric intracranial hemorrhage, intubated admitted to ICU. Patient underwent craniotomy for decompression on 09/09/2019 by Dr. Vertell Limber, patient showed slow neurological recovery and prolonged hospitalization due to seizure and aspiration pneumonia. Patient continued to be awake but significant dysphagia, PEG tube was placed on 09/25/2019. Placement has been challenging as patient is uninsured and needs long-term care. Patient vomited tube feeding contents on the night of 01/06/2019. CT abdomen pelvis was negative for obstruction. Currently tolerating tube feeds well. Awaiting placement.  Consultants  . Trama . PCCM . Neurosurgery . neurology  Procedures  . Intubation . Extubation . Mechanical ventilation . Craniotomy . PEG tube placement . Tube feeds  Antibiotics   Anti-infectives (From admission, onward)   Start     Dose/Rate Route Frequency Ordered Stop   09/27/19 1130  cephALEXin (KEFLEX) 250 MG/5ML suspension 500 mg     500 mg Per Tube Every 12 hours 09/27/19 1052 09/29/19 2250   09/25/19 1445  ceFAZolin (ANCEF) IVPB 2g/100 mL premix  Status:  Discontinued     2 g 200 mL/hr over 30 Minutes Intravenous Every 12 hours 09/25/19 1417 09/27/19 1052   09/14/19 1000  Ampicillin-Sulbactam (UNASYN) 3 g in sodium chloride 0.9 % 100 mL IVPB     3 g 200 mL/hr over 30 Minutes Intravenous Every 8 hours 09/14/19 0922 09/17/19 1816   09/13/19 1100  vancomycin (VANCOCIN) IVPB 750 mg/150 ml premix  Status:  Discontinued     750 mg 150 mL/hr over 60 Minutes Intravenous Every 24 hours 09/13/19 0728 09/13/19 0838   09/12/19 1100  vancomycin (VANCOCIN) IVPB 1000 mg/200 mL premix  Status:  Discontinued     1,000 mg 200 mL/hr over 60  Minutes Intravenous Every 24 hours 09/11/19 1034 09/13/19 0728   09/11/19 1130  piperacillin-tazobactam (ZOSYN) IVPB 3.375 g  Status:  Discontinued     3.375 g 12.5 mL/hr over 240 Minutes Intravenous Every 8 hours 09/11/19 1054 09/14/19 0845   09/11/19 1100  vancomycin (VANCOCIN) 1,500 mg in sodium chloride 0.9 % 500 mL IVPB     1,500 mg 250 mL/hr over 120 Minutes Intravenous  Once 09/11/19 1034 09/11/19 1900   09/11/19 1015  piperacillin-tazobactam (ZOSYN) IVPB 4.5 g  Status:  Discontinued     4.5 g 200 mL/hr over 30 Minutes Intravenous Every 8 hours 09/11/19 1010 09/11/19 1052   09/09/19 0845  ceFAZolin (ANCEF) IVPB 2g/100 mL premix     2 g 200 mL/hr over 30 Minutes Intravenous Every 8 hours 09/09/19 0842 09/09/19 1658     Subjective  The patient is lying in bed. He is not verbal. He refuses exam.  Objective   Vitals:  Vitals:   02/15/20 1053 02/15/20 1601  BP: 108/80 (!) 127/98  Pulse: 99 (!) 103  Resp: 18 18  Temp: 98.2 F (36.8 C) 98.6 F (37 C)  SpO2: 100% 99%    Exam:  Constitutional:  . Appears calm and comfortable until I attempt to examine him. Eyes:  . pupils and irises appear normal . Normal lids and conjunctivae Neck:  neck appears normal, no masses, normal ROM,  Respiratory:  . Respiratory effort normal. No retractions or accessory muscle use Cardiovascular:  . Pulses appear normal Abdomen:  Abdomen appears normal;  Musculoskeletal:  . Digits/nails BUE: no clubbing,  cyanosis, petechiae, infection . No edema apparent Skin:  . No rashes, lesions, ulcers visualized. Neurologic:  . CN 2-12 intact . Sensation all 4 extremities intact . Patient is moving all extremities. Psychiatric:  . Mental status: Oppositional.  I have personally reviewed the following:   Today's Data  . Vitals  Scheduled Meds: . amLODipine  10 mg Per Tube Daily  . chlorhexidine  15 mL Mouth Rinse BID  . cholecalciferol  1,000 Units Oral Daily  . dextrose  1 Tube Oral  Once  . dextrose  25 g Intravenous Once  . enoxaparin (LOVENOX) injection  40 mg Subcutaneous Daily  . feeding supplement (JEVITY 1.2 CAL)  474 mL Per Tube 4x daily  . feeding supplement (PRO-STAT SUGAR FREE 64)  30 mL Per Tube QID  . folic acid  1 mg Per Tube Daily  . free water  200 mL Per Tube Q4H  . hydrALAZINE  25 mg Per Tube Q8H  . insulin aspart  0-15 Units Subcutaneous Q4H  . levETIRAcetam  500 mg Per Tube BID  . mouth rinse  15 mL Mouth Rinse q12n4p  . metoprolol tartrate  25 mg Per Tube BID  . multivitamin  15 mL Per Tube Daily  . QUEtiapine  50 mg Per Tube QHS  . thiamine  100 mg Per Tube Daily   Continuous Infusions:  Principal Problem:   ICH (intracerebral hemorrhage) (HCC) Active Problems:   Subdural hematoma (HCC)   Intracerebral hemorrhage (HCC)   Cytotoxic brain edema (HCC)   Hemorrhagic stroke (HCC)   Acute respiratory failure ([place])   Essential hypertension   Severe protein-calorie malnutrition ([place])   CKD (chronic kidney disease), stage IV (HCC)   Seizure ([place])   AKI (acute kidney injury) ([place])   LOS: 159 days   A & P  Left intracranial hemorrhage-likely from uncontrolled hypertension, status post decompressive craniectomy and hematoma evacuation on 09/09/2019.  CT head showed no vascular malformation.  Dr. Joaquim Lai did not recommend replacement of skull bone with lack of progression. Patient remains aphasic with right hemiplegia and severe cognitive impairment.  Seizure disorder-continue Keppra, patient has been refusing medications.  No recent seizure activity.  Klebsiella UTI-treated with cefazolin  Hypertension-blood pressure is controlled, continue Norvasc, hydralazine, metoprolol.  Aspiration pneumonia-completed course of antibiotics.  Nutrition-patient status post PEG tube placement, on tube feeding.  CKD stage III-patient's creatinine was 1.23 as of 01/17/2020.  Will repeat BMP in a.m.  I have seen and examined this patient myself. I have spent  28 minutes in his evaluation and car.  DVT Prophylaxis: Lovenox CODE STATUS: Full Code Family Communication: None available Disposition: SNF. Pt homeless  Sema Stangler, DO [facility] Direct contact: see [email or web address]  7PM-7AM contact night coverage as above 02/15/2020, 5:09 PM  LOS: 158 days

## 2020-02-16 DIAGNOSIS — N184 Chronic kidney disease, stage 4 (severe): Secondary | ICD-10-CM | POA: Diagnosis not present

## 2020-02-16 DIAGNOSIS — I10 Essential (primary) hypertension: Secondary | ICD-10-CM | POA: Diagnosis not present

## 2020-02-16 DIAGNOSIS — G936 Cerebral edema: Secondary | ICD-10-CM | POA: Diagnosis not present

## 2020-02-16 DIAGNOSIS — I611 Nontraumatic intracerebral hemorrhage in hemisphere, cortical: Secondary | ICD-10-CM | POA: Diagnosis not present

## 2020-02-16 LAB — GLUCOSE, CAPILLARY
Glucose-Capillary: 105 mg/dL — ABNORMAL HIGH (ref 70–99)
Glucose-Capillary: 115 mg/dL — ABNORMAL HIGH (ref 70–99)
Glucose-Capillary: 90 mg/dL (ref 70–99)
Glucose-Capillary: 92 mg/dL (ref 70–99)
Glucose-Capillary: 93 mg/dL (ref 70–99)
Glucose-Capillary: 98 mg/dL (ref 70–99)

## 2020-02-16 NOTE — Progress Notes (Signed)
PROGRESS NOTE  Joshua [name] [id number] DOB: 03/28/1981 DOA: 09/09/2019 PCP: Patient, [name]  Brief History   39 year old African-American male admitted by neurosurgery on 09/09/2023 seizure, right hemiplegia and decreased responsiveness found to have left hemispheric intracranial hemorrhage, intubated admitted to ICU. Patient underwent craniotomy for decompression on 09/09/2019 by Dr. Vertell Limber, patient showed slow neurological recovery and prolonged hospitalization due to seizure and aspiration pneumonia. Patient continued to be awake but significant dysphagia, PEG tube was placed on 09/25/2019. Placement has been challenging as patient is uninsured and needs long-term care. Patient vomited tube feeding contents on the night of 01/06/2019. CT abdomen pelvis was negative for obstruction. Currently tolerating tube feeds well. Awaiting placement.  Consultants  . Trama . PCCM . Neurosurgery . neurology  Procedures  . Intubation . Extubation . Mechanical ventilation . Craniotomy . PEG tube placement . Tube feeds  Antibiotics   Anti-infectives (From admission, onward)   Start     Dose/Rate Route Frequency Ordered Stop   09/27/19 1130  cephALEXin (KEFLEX) 250 MG/5ML suspension 500 mg     500 mg Per Tube Every 12 hours 09/27/19 1052 09/29/19 2250   09/25/19 1445  ceFAZolin (ANCEF) IVPB 2g/100 mL premix  Status:  Discontinued     2 g 200 mL/hr over 30 Minutes Intravenous Every 12 hours 09/25/19 1417 09/27/19 1052   09/14/19 1000  Ampicillin-Sulbactam (UNASYN) 3 g in sodium chloride 0.9 % 100 mL IVPB     3 g 200 mL/hr over 30 Minutes Intravenous Every 8 hours 09/14/19 0922 09/17/19 1816   09/13/19 1100  vancomycin (VANCOCIN) IVPB 750 mg/150 ml premix  Status:  Discontinued     750 mg 150 mL/hr over 60 Minutes Intravenous Every 24 hours 09/13/19 0728 09/13/19 0838   09/12/19 1100  vancomycin (VANCOCIN) IVPB 1000 mg/200 mL premix  Status:  Discontinued     1,000 mg 200 mL/hr over 60  Minutes Intravenous Every 24 hours 09/11/19 1034 09/13/19 0728   09/11/19 1130  piperacillin-tazobactam (ZOSYN) IVPB 3.375 g  Status:  Discontinued     3.375 g 12.5 mL/hr over 240 Minutes Intravenous Every 8 hours 09/11/19 1054 09/14/19 0845   09/11/19 1100  vancomycin (VANCOCIN) 1,500 mg in sodium chloride 0.9 % 500 mL IVPB     1,500 mg 250 mL/hr over 120 Minutes Intravenous  Once 09/11/19 1034 09/11/19 1900   09/11/19 1015  piperacillin-tazobactam (ZOSYN) IVPB 4.5 g  Status:  Discontinued     4.5 g 200 mL/hr over 30 Minutes Intravenous Every 8 hours 09/11/19 1010 09/11/19 1052   09/09/19 0845  ceFAZolin (ANCEF) IVPB 2g/100 mL premix     2 g 200 mL/hr over 30 Minutes Intravenous Every 8 hours 09/09/19 0842 09/09/19 1658     Subjective  The patient is lying in bed. He is not verbal. He refuses exam.  Objective   Vitals:  Vitals:   02/16/20 0754 02/16/20 1150  BP: 115/74 (!) 109/99  Pulse: 91 88  Resp: 19 19  Temp: 98.4 F (36.9 C) (!) 97.5 F (36.4 C)  SpO2: 100% 100%    Exam:  Constitutional:  . Appears calm and comfortable until I attempt to examine him. Respiratory:  . Respiratory effort normal. No retractions or accessory muscle use Abdomen:  Abdomen appears normal;  Musculoskeletal:  . Digits/nails BUE: no clubbing, cyanosis, petechiae, infection . No edema apparent Skin:  . No rashes, lesions, ulcers visualized. Neurologic:  . CN 2-12 intact . Sensation all 4 extremities intact . Patient  is moving all extremities. Psychiatric:  . Mental status: Oppositional.  I have personally reviewed the following:   Today's Data  . Vitals  Scheduled Meds: . amLODipine  10 mg Per Tube Daily  . chlorhexidine  15 mL Mouth Rinse BID  . cholecalciferol  1,000 Units Oral Daily  . dextrose  1 Tube Oral Once  . dextrose  25 g Intravenous Once  . enoxaparin (LOVENOX) injection  40 mg Subcutaneous Daily  . feeding supplement (JEVITY 1.2 CAL)  474 mL Per Tube 4x daily  .  feeding supplement (PRO-STAT SUGAR FREE 64)  30 mL Per Tube QID  . folic acid  1 mg Per Tube Daily  . free water  200 mL Per Tube Q4H  . hydrALAZINE  25 mg Per Tube Q8H  . insulin aspart  0-15 Units Subcutaneous Q4H  . levETIRAcetam  500 mg Per Tube BID  . mouth rinse  15 mL Mouth Rinse q12n4p  . metoprolol tartrate  25 mg Per Tube BID  . multivitamin  15 mL Per Tube Daily  . QUEtiapine  50 mg Per Tube QHS  . thiamine  100 mg Per Tube Daily   Continuous Infusions:  Principal Problem:   ICH (intracerebral hemorrhage) (HCC) Active Problems:   Subdural hematoma (HCC)   Intracerebral hemorrhage (HCC)   Cytotoxic brain edema (HCC)   Hemorrhagic stroke (HCC)   Acute respiratory failure ([place])   Essential hypertension   Severe protein-calorie malnutrition ([place])   CKD (chronic kidney disease), stage IV (HCC)   Seizure ([place])   AKI (acute kidney injury) ([place])   LOS: 160 days   A & P  Left intracranial hemorrhage-likely from uncontrolled hypertension, status post decompressive craniectomy and hematoma evacuation on 09/09/2019.  CT head showed no vascular malformation.  Dr. Joaquim Lai did not recommend replacement of skull bone with lack of progression. Patient remains aphasic with right hemiplegia and severe cognitive impairment.  Seizure disorder-continue Keppra, patient has been refusing medications.  No recent seizure activity.  Klebsiella UTI-treated with cefazolin  Hypertension-blood pressure is controlled, continue Norvasc, hydralazine, metoprolol.  Aspiration pneumonia-completed course of antibiotics.  Nutrition-patient status post PEG tube placement, on tube feeding.  CKD stage III-patient's creatinine was 1.23 as of 01/17/2020.  Pt is refusing further attempts to draw blood.  I have seen and examined this patient myself. I have spent 30 minutes in his evaluation and car.  DVT Prophylaxis: Lovenox CODE STATUS: Full Code Family Communication: None available Disposition: SNF. Pt  homeless  Jeanie Mccard, DO [facility] Direct contact: see [email or web address]  7PM-7AM contact night coverage as above 02/16/2020, 3:22 PM  LOS: 158 days

## 2020-02-17 DIAGNOSIS — I611 Nontraumatic intracerebral hemorrhage in hemisphere, cortical: Secondary | ICD-10-CM | POA: Diagnosis not present

## 2020-02-17 DIAGNOSIS — N184 Chronic kidney disease, stage 4 (severe): Secondary | ICD-10-CM | POA: Diagnosis not present

## 2020-02-17 DIAGNOSIS — I10 Essential (primary) hypertension: Secondary | ICD-10-CM | POA: Diagnosis not present

## 2020-02-17 DIAGNOSIS — G936 Cerebral edema: Secondary | ICD-10-CM | POA: Diagnosis not present

## 2020-02-17 LAB — GLUCOSE, CAPILLARY: Glucose-Capillary: 192 mg/dL — ABNORMAL HIGH (ref 70–99)

## 2020-02-17 NOTE — Progress Notes (Signed)
PROGRESS NOTE  Bevin Vipperman [id number] DOB: 12-21-1981 DOA: 09/09/2019 PCP: Patient, [name]  Brief History   39 year old African-American male admitted by neurosurgery on 09/09/2023 seizure, right hemiplegia and decreased responsiveness found to have left hemispheric intracranial hemorrhage, intubated admitted to ICU. Patient underwent craniotomy for decompression on 09/09/2019 by Dr. Vertell Limber, patient showed slow neurological recovery and prolonged hospitalization due to seizure and aspiration pneumonia. Patient continued to be awake but significant dysphagia, PEG tube was placed on 09/25/2019. Placement has been challenging as patient is uninsured and needs long-term care. Patient vomited tube feeding contents on the night of 01/06/2019. CT abdomen pelvis was negative for obstruction. Currently tolerating tube feeds well. Awaiting placement.  Consultants  . Trama . PCCM . Neurosurgery . neurology  Procedures  . Intubation . Extubation . Mechanical ventilation . Craniotomy . PEG tube placement . Tube feeds  Antibiotics   Anti-infectives (From admission, onward)   Start     Dose/Rate Route Frequency Ordered Stop   09/27/19 1130  cephALEXin (KEFLEX) 250 MG/5ML suspension 500 mg     500 mg Per Tube Every 12 hours 09/27/19 1052 09/29/19 2250   09/25/19 1445  ceFAZolin (ANCEF) IVPB 2g/100 mL premix  Status:  Discontinued     2 g 200 mL/hr over 30 Minutes Intravenous Every 12 hours 09/25/19 1417 09/27/19 1052   09/14/19 1000  Ampicillin-Sulbactam (UNASYN) 3 g in sodium chloride 0.9 % 100 mL IVPB     3 g 200 mL/hr over 30 Minutes Intravenous Every 8 hours 09/14/19 0922 09/17/19 1816   09/13/19 1100  vancomycin (VANCOCIN) IVPB 750 mg/150 ml premix  Status:  Discontinued     750 mg 150 mL/hr over 60 Minutes Intravenous Every 24 hours 09/13/19 0728 09/13/19 0838   09/12/19 1100  vancomycin (VANCOCIN) IVPB 1000 mg/200 mL premix  Status:  Discontinued     1,000 mg 200 mL/hr over 60  Minutes Intravenous Every 24 hours 09/11/19 1034 09/13/19 0728   09/11/19 1130  piperacillin-tazobactam (ZOSYN) IVPB 3.375 g  Status:  Discontinued     3.375 g 12.5 mL/hr over 240 Minutes Intravenous Every 8 hours 09/11/19 1054 09/14/19 0845   09/11/19 1100  vancomycin (VANCOCIN) 1,500 mg in sodium chloride 0.9 % 500 mL IVPB     1,500 mg 250 mL/hr over 120 Minutes Intravenous  Once 09/11/19 1034 09/11/19 1900   09/11/19 1015  piperacillin-tazobactam (ZOSYN) IVPB 4.5 g  Status:  Discontinued     4.5 g 200 mL/hr over 30 Minutes Intravenous Every 8 hours 09/11/19 1010 09/11/19 1052   09/09/19 0845  ceFAZolin (ANCEF) IVPB 2g/100 mL premix     2 g 200 mL/hr over 30 Minutes Intravenous Every 8 hours 09/09/19 0842 09/09/19 1658     Subjective  The patient is lying in bed. He is not verbal. He refuses exam.  Objective   Vitals:  Vitals:   02/17/20 0758 02/17/20 1232  BP: 120/87 (!) 123/93  Pulse: 88 86  Resp: 19 20  Temp: 97.6 F (36.4 C) (!) 97.4 F (36.3 C)  SpO2: 100% 100%    Exam:  Constitutional:  . Appears calm and comfortable until I attempt to examine him. Respiratory:  . Respiratory effort normal. No retractions or accessory muscle use Abdomen:  Abdomen appears normal;  Musculoskeletal:  . Digits/nails BUE: no clubbing, cyanosis, petechiae, infection . No edema apparent Skin:  . No rashes, lesions, ulcers visualized. Neurologic:  . CN 2-12 intact . Sensation all 4 extremities intact . Patient  is moving all extremities. Psychiatric:  . Mental status: Oppositional.  I have personally reviewed the following:   Today's Data  . Vitals  Scheduled Meds: . amLODipine  10 mg Per Tube Daily  . chlorhexidine  15 mL Mouth Rinse BID  . cholecalciferol  1,000 Units Oral Daily  . dextrose  1 Tube Oral Once  . dextrose  25 g Intravenous Once  . enoxaparin (LOVENOX) injection  40 mg Subcutaneous Daily  . feeding supplement (JEVITY 1.2 CAL)  474 mL Per Tube 4x daily  .  feeding supplement (PRO-STAT SUGAR FREE 64)  30 mL Per Tube QID  . folic acid  1 mg Per Tube Daily  . free water  200 mL Per Tube Q4H  . hydrALAZINE  25 mg Per Tube Q8H  . insulin aspart  0-15 Units Subcutaneous Q4H  . levETIRAcetam  500 mg Per Tube BID  . mouth rinse  15 mL Mouth Rinse q12n4p  . metoprolol tartrate  25 mg Per Tube BID  . multivitamin  15 mL Per Tube Daily  . QUEtiapine  50 mg Per Tube QHS  . thiamine  100 mg Per Tube Daily   Continuous Infusions:  Principal Problem:   ICH (intracerebral hemorrhage) (HCC) Active Problems:   Subdural hematoma (HCC)   Intracerebral hemorrhage (HCC)   Cytotoxic brain edema (HCC)   Hemorrhagic stroke (HCC)   Acute respiratory failure ([place])   Essential hypertension   Severe protein-calorie malnutrition ([place])   CKD (chronic kidney disease), stage IV (HCC)   Seizure ([place])   AKI (acute kidney injury) ([place])   LOS: 161 days   A & P  Left intracranial hemorrhage-likely from uncontrolled hypertension, status post decompressive craniectomy and hematoma evacuation on 09/09/2019.  CT head showed no vascular malformation.  Dr. Joaquim Lai did not recommend replacement of skull bone with lack of progression. Patient remains aphasic with right hemiplegia and severe cognitive impairment.  Seizure disorder-continue Keppra, patient has been refusing medications.  No recent seizure activity.  Klebsiella UTI-treated with cefazolin  Hypertension-blood pressure is controlled, continue Norvasc, hydralazine, metoprolol.  Aspiration pneumonia-completed course of antibiotics.  Nutrition-patient status post PEG tube placement, on tube feeding.  CKD stage III-patient's creatinine was 1.23 as of 01/17/2020.  Pt is refusing further attempts to draw blood.  I have seen and examined this patient myself. I have spent 25 minutes in his evaluation and car.  DVT Prophylaxis: Lovenox CODE STATUS: Full Code Family Communication: None available Disposition: SNF. Pt  homeless  Kaoru Rezendes, DO [facility] Direct contact: see [email or web address]  7PM-7AM contact night coverage as above 02/17/2020, 3:52 PM  LOS: 158 days

## 2020-02-18 DIAGNOSIS — I10 Essential (primary) hypertension: Secondary | ICD-10-CM | POA: Diagnosis not present

## 2020-02-18 DIAGNOSIS — I611 Nontraumatic intracerebral hemorrhage in hemisphere, cortical: Secondary | ICD-10-CM | POA: Diagnosis not present

## 2020-02-18 DIAGNOSIS — N184 Chronic kidney disease, stage 4 (severe): Secondary | ICD-10-CM | POA: Diagnosis not present

## 2020-02-18 DIAGNOSIS — G936 Cerebral edema: Secondary | ICD-10-CM | POA: Diagnosis not present

## 2020-02-18 NOTE — Progress Notes (Signed)
RN attempted to get the patient to brush his teeth with assistance or on his own and he refused. RN has tried to encourage the patient to motivate him to do more. Pt seems to understand but does not want to participate.

## 2020-02-18 NOTE — Progress Notes (Signed)
PROGRESS NOTE  Joshua [name] [id number] DOB: [birth year] DOA: 09/09/2019 PCP: Patient, [name]  Brief History   39 year old African-American male admitted by neurosurgery on 09/09/2023 seizure, right hemiplegia and decreased responsiveness found to have left hemispheric intracranial hemorrhage, intubated admitted to ICU. Patient underwent craniotomy for decompression on 09/09/2019 by Dr. Vertell Limber, patient showed slow neurological recovery and prolonged hospitalization due to seizure and aspiration pneumonia. Patient continued to be awake but significant dysphagia, PEG tube was placed on 09/25/2019. Placement has been challenging as patient is uninsured and needs long-term care. Patient vomited tube feeding contents on the night of 01/06/2019. CT abdomen pelvis was negative for obstruction. Currently tolerating tube feeds well. Awaiting placement.  Consultants  . Trama . PCCM . Neurosurgery . neurology  Procedures  . Intubation . Extubation . Mechanical ventilation . Craniotomy . PEG tube placement . Tube feeds  Antibiotics   Anti-infectives (From admission, onward)   Start     Dose/Rate Route Frequency Ordered Stop   09/27/19 1130  cephALEXin (KEFLEX) 250 MG/5ML suspension 500 mg     500 mg Per Tube Every 12 hours 09/27/19 1052 09/29/19 2250   09/25/19 1445  ceFAZolin (ANCEF) IVPB 2g/100 mL premix  Status:  Discontinued     2 g 200 mL/hr over 30 Minutes Intravenous Every 12 hours 09/25/19 1417 09/27/19 1052   09/14/19 1000  Ampicillin-Sulbactam (UNASYN) 3 g in sodium chloride 0.9 % 100 mL IVPB     3 g 200 mL/hr over 30 Minutes Intravenous Every 8 hours 09/14/19 0922 09/17/19 1816   09/13/19 1100  vancomycin (VANCOCIN) IVPB 750 mg/150 ml premix  Status:  Discontinued     750 mg 150 mL/hr over 60 Minutes Intravenous Every 24 hours 09/13/19 0728 09/13/19 0838   09/12/19 1100  vancomycin (VANCOCIN) IVPB 1000 mg/200 mL premix  Status:  Discontinued     1,000 mg 200 mL/hr over 60  Minutes Intravenous Every 24 hours 09/11/19 1034 09/13/19 0728   09/11/19 1130  piperacillin-tazobactam (ZOSYN) IVPB 3.375 g  Status:  Discontinued     3.375 g 12.5 mL/hr over 240 Minutes Intravenous Every 8 hours 09/11/19 1054 09/14/19 0845   09/11/19 1100  vancomycin (VANCOCIN) 1,500 mg in sodium chloride 0.9 % 500 mL IVPB     1,500 mg 250 mL/hr over 120 Minutes Intravenous  Once 09/11/19 1034 09/11/19 1900   09/11/19 1015  piperacillin-tazobactam (ZOSYN) IVPB 4.5 g  Status:  Discontinued     4.5 g 200 mL/hr over 30 Minutes Intravenous Every 8 hours 09/11/19 1010 09/11/19 1052   09/09/19 0845  ceFAZolin (ANCEF) IVPB 2g/100 mL premix     2 g 200 mL/hr over 30 Minutes Intravenous Every 8 hours 09/09/19 0842 09/09/19 1658     Subjective  The patient is lying in bed. He is not verbal. He refuses exam, oral care, and to get out of bed today.  Objective   Vitals:  Vitals:   02/18/20 0803 02/18/20 1219  BP: (!) 122/97 117/83  Pulse: 89 93  Resp: 19 19  Temp: (!) 97.3 F (36.3 C) 98.2 F (36.8 C)  SpO2: 100% 99%    Exam:  Constitutional:  . Appears calm and comfortable until I attempt to examine him. Respiratory:  . Respiratory effort normal. No retractions or accessory muscle use Abdomen:  Abdomen appears normal;  Musculoskeletal:  . Digits/nails BUE: no clubbing, cyanosis, petechiae, infection . No edema apparent Skin:  . No rashes, lesions, ulcers visualized. Neurologic:  . CN 2-12  intact . Sensation all 4 extremities intact . Patient is moving all extremities. Psychiatric:  . Mental status: Oppositional.  I have personally reviewed the following:   Today's Data  . Vitals  Scheduled Meds: . amLODipine  10 mg Per Tube Daily  . chlorhexidine  15 mL Mouth Rinse BID  . cholecalciferol  1,000 Units Oral Daily  . dextrose  1 Tube Oral Once  . dextrose  25 g Intravenous Once  . enoxaparin (LOVENOX) injection  40 mg Subcutaneous Daily  . feeding supplement (JEVITY  1.2 CAL)  474 mL Per Tube 4x daily  . feeding supplement (PRO-STAT SUGAR FREE 64)  30 mL Per Tube QID  . folic acid  1 mg Per Tube Daily  . free water  200 mL Per Tube Q4H  . hydrALAZINE  25 mg Per Tube Q8H  . insulin aspart  0-15 Units Subcutaneous Q4H  . levETIRAcetam  500 mg Per Tube BID  . mouth rinse  15 mL Mouth Rinse q12n4p  . metoprolol tartrate  25 mg Per Tube BID  . multivitamin  15 mL Per Tube Daily  . QUEtiapine  50 mg Per Tube QHS  . thiamine  100 mg Per Tube Daily   Continuous Infusions:  Principal Problem:   ICH (intracerebral hemorrhage) (HCC) Active Problems:   Subdural hematoma (HCC)   Intracerebral hemorrhage (HCC)   Cytotoxic brain edema (HCC)   Hemorrhagic stroke (HCC)   Acute respiratory failure ([place])   Essential hypertension   Severe protein-calorie malnutrition ([place])   CKD (chronic kidney disease), stage IV (HCC)   Seizure ([place])   AKI (acute kidney injury) ([place])   LOS: 162 days   A & P  Left intracranial hemorrhage-likely from uncontrolled hypertension, status post decompressive craniectomy and hematoma evacuation on 09/09/2019.  CT head showed no vascular malformation.  Dr. Joaquim Lai did not recommend replacement of skull bone with lack of progression. Patient remains aphasic with right hemiplegia and severe cognitive impairment.  Seizure disorder-continue Keppra, patient has been refusing medications.  No recent seizure activity.  Klebsiella UTI-treated with cefazolin  Hypertension-blood pressure is controlled, continue Norvasc, hydralazine, metoprolol.  Aspiration pneumonia-completed course of antibiotics.  Nutrition-patient status post PEG tube placement, on tube feeding.  CKD stage III-patient's creatinine was 1.23 as of 01/17/2020.  Pt is refusing further attempts to draw blood.  I have seen and examined this patient myself. I have spent 25 minutes in his evaluation and car.  DVT Prophylaxis: Lovenox CODE STATUS: Full Code Family  Communication: None available Disposition: SNF. Pt homeless  Macayla Ekdahl, DO [facility] Direct contact: see [email or web address]  7PM-7AM contact night coverage as above 02/18/2020, 3:55 PM  LOS: 158 days

## 2020-02-19 DIAGNOSIS — I611 Nontraumatic intracerebral hemorrhage in hemisphere, cortical: Secondary | ICD-10-CM | POA: Diagnosis not present

## 2020-02-19 DIAGNOSIS — G936 Cerebral edema: Secondary | ICD-10-CM | POA: Diagnosis not present

## 2020-02-19 DIAGNOSIS — I10 Essential (primary) hypertension: Secondary | ICD-10-CM | POA: Diagnosis not present

## 2020-02-19 DIAGNOSIS — N184 Chronic kidney disease, stage 4 (severe): Secondary | ICD-10-CM | POA: Diagnosis not present

## 2020-02-19 LAB — GLUCOSE, CAPILLARY
Glucose-Capillary: 100 mg/dL — ABNORMAL HIGH (ref 70–99)
Glucose-Capillary: 102 mg/dL — ABNORMAL HIGH (ref 70–99)
Glucose-Capillary: 102 mg/dL — ABNORMAL HIGH (ref 70–99)
Glucose-Capillary: 108 mg/dL — ABNORMAL HIGH (ref 70–99)
Glucose-Capillary: 112 mg/dL — ABNORMAL HIGH (ref 70–99)
Glucose-Capillary: 114 mg/dL — ABNORMAL HIGH (ref 70–99)
Glucose-Capillary: 120 mg/dL — ABNORMAL HIGH (ref 70–99)
Glucose-Capillary: 123 mg/dL — ABNORMAL HIGH (ref 70–99)
Glucose-Capillary: 123 mg/dL — ABNORMAL HIGH (ref 70–99)
Glucose-Capillary: 129 mg/dL — ABNORMAL HIGH (ref 70–99)
Glucose-Capillary: 75 mg/dL (ref 70–99)
Glucose-Capillary: 81 mg/dL (ref 70–99)
Glucose-Capillary: 91 mg/dL (ref 70–99)
Glucose-Capillary: 97 mg/dL (ref 70–99)
Glucose-Capillary: 85 mg/dL (ref 70–99)

## 2020-02-19 NOTE — Progress Notes (Signed)
PROGRESS NOTE  Joshua [name] [id number] DOB: [birth year] DOA: 09/09/2019 PCP: Patient, [name]  Brief History   39 year old African-American male admitted by neurosurgery on 09/09/2023 seizure, right hemiplegia and decreased responsiveness found to have left hemispheric intracranial hemorrhage, intubated admitted to ICU. Patient underwent craniotomy for decompression on 09/09/2019 by Dr. Vertell [name], patient showed slow neurological recovery and prolonged hospitalization due to seizure and aspiration pneumonia. Patient continued to be awake but significant dysphagia, PEG tube was placed on 09/25/2019. Placement has been challenging as patient is uninsured and needs long-term care. Patient vomited tube feeding contents on the night of 01/06/2019. CT abdomen pelvis was negative for obstruction. Currently tolerating tube feeds well. Awaiting placement.  Consultants  . Joshua [name] . PCCM . Neurosurgery . neurology  Procedures  . Intubation . Extubation . Mechanical ventilation . Craniotomy . PEG tube placement . Tube feeds  Antibiotics   Anti-infectives (From admission, onward)   Start     Dose/Rate Route Frequency Ordered Stop   09/27/19 1130  cephALEXin (KEFLEX) 250 MG/5ML suspension 500 mg     500 mg Per Tube Every 12 hours 09/27/19 1052 09/29/19 2250   09/25/19 1445  ceFAZolin (ANCEF) IVPB 2g/100 mL premix  Status:  Discontinued     2 g 200 mL/hr over 30 Minutes Intravenous Every 12 hours 09/25/19 1417 09/27/19 1052   09/14/19 1000  Ampicillin-Sulbactam (UNASYN) 3 g in sodium chloride 0.9 % 100 mL IVPB     3 g 200 mL/hr over 30 Minutes Intravenous Every 8 hours 09/14/19 0922 09/17/19 1816   09/13/19 1100  vancomycin (VANCOCIN) IVPB 750 mg/150 ml premix  Status:  Discontinued     750 mg 150 mL/hr over 60 Minutes Intravenous Every 24 hours 09/13/19 0728 09/13/19 0838   09/12/19 1100  vancomycin (VANCOCIN) IVPB 1000 mg/200 mL premix  Status:  Discontinued     1,000 mg 200 mL/hr over 60  Minutes Intravenous Every 24 hours 09/11/19 1034 09/13/19 0728   09/11/19 1130  piperacillin-tazobactam (ZOSYN) IVPB 3.375 g  Status:  Discontinued     3.375 g 12.5 mL/hr over 240 Minutes Intravenous Every 8 hours 09/11/19 1054 09/14/19 0845   09/11/19 1100  vancomycin (VANCOCIN) 1,500 mg in sodium chloride 0.9 % 500 mL IVPB     1,500 mg 250 mL/hr over 120 Minutes Intravenous  Once 09/11/19 1034 09/11/19 1900   09/11/19 1015  piperacillin-tazobactam (ZOSYN) IVPB 4.5 g  Status:  Discontinued     4.5 g 200 mL/hr over 30 Minutes Intravenous Every 8 hours 09/11/19 1010 09/11/19 1052   09/09/19 0845  ceFAZolin (ANCEF) IVPB 2g/100 mL premix     2 g 200 mL/hr over 30 Minutes Intravenous Every 8 hours 09/09/19 0842 09/09/19 1658     Subjective  The patient is lying in bed. He is not verbal. He refuses exam, oral care, and to get out of bed today.  Objective   Vitals:  Vitals:   02/19/20 0815 02/19/20 1241  BP: 126/90 121/84  Pulse: 92 100  Resp: 16 20  Temp: 98.6 F (37 C) 98.7 F (37.1 C)  SpO2: 100% 99%    Exam:  Constitutional:  . Appears calm and comfortable until I attempt to examine him when he slaps my hand away. Respiratory:  . Respiratory effort normal. No retractions or accessory muscle use Abdomen:  Abdomen appears normal;  Musculoskeletal:  . Digits/nails BUE: no clubbing, cyanosis, petechiae, infection . No edema apparent Skin:  . No rashes, lesions, ulcers visualized. Neurologic:  .  CN 2-12 intact . Sensation all 4 extremities intact . Patient is moving all extremities. Psychiatric:  . Mental status: Oppositional.  I have personally reviewed the following:   Today's Data  . Vitals  Scheduled Meds: . amLODipine  10 mg Per Tube Daily  . chlorhexidine  15 mL Mouth Rinse BID  . cholecalciferol  1,000 Units Oral Daily  . dextrose  1 Tube Oral Once  . dextrose  25 g Intravenous Once  . enoxaparin (LOVENOX) injection  40 mg Subcutaneous Daily  . feeding  supplement (JEVITY 1.2 CAL)  474 mL Per Tube 4x daily  . feeding supplement (PRO-STAT SUGAR FREE 64)  30 mL Per Tube QID  . folic acid  1 mg Per Tube Daily  . free water  200 mL Per Tube Q4H  . hydrALAZINE  25 mg Per Tube Q8H  . insulin aspart  0-15 Units Subcutaneous Q4H  . levETIRAcetam  500 mg Per Tube BID  . mouth rinse  15 mL Mouth Rinse q12n4p  . metoprolol tartrate  25 mg Per Tube BID  . multivitamin  15 mL Per Tube Daily  . QUEtiapine  50 mg Per Tube QHS  . thiamine  100 mg Per Tube Daily   Continuous Infusions:  Principal Problem:   ICH (intracerebral hemorrhage) (HCC) Active Problems:   Subdural hematoma (HCC)   Intracerebral hemorrhage (HCC)   Cytotoxic brain edema (HCC)   Hemorrhagic stroke (HCC)   Acute respiratory failure ([place])   Essential hypertension   Severe protein-calorie malnutrition ([place])   CKD (chronic kidney disease), stage IV (HCC)   Seizure ([place])   AKI (acute kidney injury) ([place])   LOS: 163 days   A & P  Left intracranial hemorrhage: likely from uncontrolled hypertension, status post decompressive craniectomy and hematoma evacuation on 09/09/2019.  CT head showed no vascular malformation.  Dr. Joaquim [name] did not recommend replacement of skull bone with lack of progression. Patient remains aphasic with right hemiplegia and severe cognitive impairment.  Seizure disorder: Continue Keppra, patient has been refusing medications.  No recent seizure activity.  Klebsiella UTI: Treatment with cefazolin completed.  Hypertension: Blood pressure is controlled, continue Norvasc, hydralazine, metoprolol.  Aspiration pneumonia: The patient has completed course of antibiotics.  Nutrition: Patient status post PEG tube placement, on tube feeding.  CKD stage III: Patient's creatinine was 1.23 as of 01/17/2020.  Pt is refusing further attempts to draw blood.  I have seen and examined this patient myself. I have spent 27 minutes in his evaluation and car.  DVT  Prophylaxis: Lovenox CODE STATUS: Full Code Family Communication: None available Disposition: SNF. Pt homeless  Joshua Brailsford, DO [facility] Direct contact: see [email or web address]  7PM-7AM contact night coverage as above 02/19/2020, 2:47 PM  LOS: 158 days

## 2020-02-20 DIAGNOSIS — I611 Nontraumatic intracerebral hemorrhage in hemisphere, cortical: Secondary | ICD-10-CM | POA: Diagnosis not present

## 2020-02-20 DIAGNOSIS — I10 Essential (primary) hypertension: Secondary | ICD-10-CM | POA: Diagnosis not present

## 2020-02-20 DIAGNOSIS — N184 Chronic kidney disease, stage 4 (severe): Secondary | ICD-10-CM | POA: Diagnosis not present

## 2020-02-20 DIAGNOSIS — G936 Cerebral edema: Secondary | ICD-10-CM | POA: Diagnosis not present

## 2020-02-20 LAB — GLUCOSE, CAPILLARY
Glucose-Capillary: 100 mg/dL — ABNORMAL HIGH (ref 70–99)
Glucose-Capillary: 105 mg/dL — ABNORMAL HIGH (ref 70–99)
Glucose-Capillary: 76 mg/dL (ref 70–99)
Glucose-Capillary: 80 mg/dL (ref 70–99)
Glucose-Capillary: 90 mg/dL (ref 70–99)
Glucose-Capillary: 99 mg/dL (ref 70–99)

## 2020-02-20 NOTE — Plan of Care (Signed)
  Problem: Clinical Measurements: Goal: Diagnostic test results will improve Outcome: Progressing Goal: Respiratory complications will improve Outcome: Progressing Goal: Cardiovascular complication will be avoided Outcome: Progressing   Problem: Activity: Goal: Risk for activity intolerance will decrease Outcome: Progressing   Problem: Nutrition: Goal: Adequate nutrition will be maintained Outcome: Progressing   Problem: Coping: Goal: Level of anxiety will decrease Outcome: Progressing   Problem: Elimination: Goal: Will not experience complications related to bowel motility Outcome: Progressing Goal: Will not experience complications related to urinary retention Outcome: Progressing   Problem: Pain Managment: Goal: General experience of comfort will improve Outcome: Progressing   Problem: Safety: Goal: Ability to remain free from injury will improve Outcome: Progressing   Problem: Skin Integrity: Goal: Risk for impaired skin integrity will decrease Outcome: Progressing   Problem: Clinical Measurements: Goal: Usual level of consciousness will be regained or maintained. Outcome: Progressing Goal: Neurologic status will improve Outcome: Progressing Goal: Ability to maintain intracranial pressure will improve Outcome: Progressing   Problem: Education: Goal: Knowledge of disease or condition will improve Outcome: Progressing Goal: Knowledge of secondary prevention will improve Outcome: Progressing Goal: Knowledge of patient specific risk factors addressed and post discharge goals established will improve Outcome: Progressing Goal: Individualized Educational Video(s) Outcome: Progressing   Problem: Coping: Goal: Will verbalize positive feelings about self Outcome: Progressing Goal: Will identify appropriate support needs Outcome: Progressing   Problem: Health Behavior/Discharge Planning: Goal: Ability to manage health-related needs will improve Outcome:  Progressing   Problem: Self-Care: Goal: Ability to participate in self-care as condition permits will improve Outcome: Progressing Goal: Ability to communicate needs accurately will improve Outcome: Progressing   Problem: Nutrition: Goal: Risk of aspiration will decrease Outcome: Progressing Goal: Dietary intake will improve Outcome: Progressing   Problem: Intracerebral Hemorrhage Tissue Perfusion: Goal: Complications of Intracerebral Hemorrhage will be minimized Outcome: Progressing

## 2020-02-20 NOTE — Plan of Care (Signed)
Problem: Clinical Measurements: Goal: Diagnostic test results will improve 02/20/2020 1945 by Waynetta Sandy, RN Outcome: Progressing 02/20/2020 1945 by Waynetta Sandy, RN Outcome: Progressing Goal: Respiratory complications will improve 02/20/2020 1945 by Waynetta Sandy, RN Outcome: Progressing 02/20/2020 1945 by Waynetta Sandy, RN Outcome: Progressing Goal: Cardiovascular complication will be avoided 02/20/2020 1945 by Waynetta Sandy, RN Outcome: Progressing 02/20/2020 1945 by Waynetta Sandy, RN Outcome: Progressing   Problem: Activity: Goal: Risk for activity intolerance will decrease 02/20/2020 1945 by Waynetta Sandy, RN Outcome: Progressing 02/20/2020 1945 by Waynetta Sandy, RN Outcome: Progressing   Problem: Nutrition: Goal: Adequate nutrition will be maintained 02/20/2020 1945 by Waynetta Sandy, RN Outcome: Progressing 02/20/2020 1945 by Waynetta Sandy, RN Outcome: Progressing   Problem: Coping: Goal: Level of anxiety will decrease 02/20/2020 1945 by Waynetta Sandy, RN Outcome: Progressing 02/20/2020 1945 by Waynetta Sandy, RN Outcome: Progressing   Problem: Elimination: Goal: Will not experience complications related to bowel motility 02/20/2020 1945 by Waynetta Sandy, RN Outcome: Progressing 02/20/2020 1945 by Waynetta Sandy, RN Outcome: Progressing Goal: Will not experience complications related to urinary retention 02/20/2020 1945 by Waynetta Sandy, RN Outcome: Progressing 02/20/2020 1945 by Waynetta Sandy, RN Outcome: Progressing   Problem: Pain Managment: Goal: General experience of comfort will improve 02/20/2020 1945 by Waynetta Sandy, RN Outcome: Progressing 02/20/2020 1945 by Waynetta Sandy, RN Outcome: Progressing   Problem: Safety: Goal: Ability to remain free from injury will improve 02/20/2020 1945 by Waynetta Sandy, RN Outcome: Progressing 02/20/2020 1945 by Waynetta Sandy, RN Outcome: Progressing   Problem: Skin Integrity: Goal: Risk for impaired skin integrity will decrease 02/20/2020 1945 by Waynetta Sandy, RN Outcome: Progressing 02/20/2020 1945 by Waynetta Sandy, RN Outcome: Progressing   Problem: Clinical Measurements: Goal: Usual level of consciousness will be regained or maintained. 02/20/2020 1945 by Waynetta Sandy, RN Outcome: Progressing 02/20/2020 1945 by Waynetta Sandy, RN Outcome: Progressing Goal: Neurologic status will improve 02/20/2020 1945 by Waynetta Sandy, RN Outcome: Progressing 02/20/2020 1945 by Waynetta Sandy, RN Outcome: Progressing Goal: Ability to maintain intracranial pressure will improve 02/20/2020 1945 by Waynetta Sandy, RN Outcome: Progressing 02/20/2020 1945 by Waynetta Sandy, RN Outcome: Progressing   Problem: Education: Goal: Knowledge of disease or condition will improve 02/20/2020 1945 by Waynetta Sandy, RN Outcome: Progressing 02/20/2020 1945 by Waynetta Sandy, RN Outcome: Progressing Goal: Knowledge of secondary prevention will improve 02/20/2020 1945 by Waynetta Sandy, RN Outcome: Progressing 02/20/2020 1945 by Waynetta Sandy, RN Outcome: Progressing Goal: Knowledge of patient specific risk factors addressed and post discharge goals established will improve 02/20/2020 1945 by Waynetta Sandy, RN Outcome: Progressing 02/20/2020 1945 by Waynetta Sandy, RN Outcome: Progressing Goal: Individualized Educational Video(s) 02/20/2020 1945 by Waynetta Sandy, RN Outcome: Progressing 02/20/2020 1945 by Waynetta Sandy, RN Outcome: Progressing   Problem: Coping: Goal: Will verbalize positive feelings about self 02/20/2020 1945 by Waynetta Sandy, RN Outcome: Progressing 02/20/2020 1945 by Waynetta Sandy,  RN Outcome: Progressing Goal: Will identify appropriate support needs 02/20/2020 1945 by Waynetta Sandy, RN Outcome: Progressing 02/20/2020 1945 by Waynetta Sandy, RN Outcome: Progressing   Problem: Health Behavior/Discharge Planning: Goal: Ability to manage health-related needs will improve 02/20/2020 1945 by Waynetta Sandy, RN Outcome: Progressing 02/20/2020 1945 by Waynetta Sandy, RN Outcome: Progressing   Problem: Self-Care: Goal: Ability to participate in self-care as condition  permits will improve 02/20/2020 1945 by Waynetta Sandy, RN Outcome: Progressing 02/20/2020 1945 by Waynetta Sandy, RN Outcome: Progressing Goal: Ability to communicate needs accurately will improve 02/20/2020 1945 by Waynetta Sandy, RN Outcome: Progressing 02/20/2020 1945 by Waynetta Sandy, RN Outcome: Progressing   Problem: Nutrition: Goal: Risk of aspiration will decrease 02/20/2020 1945 by Waynetta Sandy, RN Outcome: Progressing 02/20/2020 1945 by Waynetta Sandy, RN Outcome: Progressing Goal: Dietary intake will improve 02/20/2020 1945 by Waynetta Sandy, RN Outcome: Progressing 02/20/2020 1945 by Waynetta Sandy, RN Outcome: Progressing   Problem: Intracerebral Hemorrhage Tissue Perfusion: Goal: Complications of Intracerebral Hemorrhage will be minimized 02/20/2020 1945 by Waynetta Sandy, RN Outcome: Progressing 02/20/2020 1945 by Waynetta Sandy, RN Outcome: Progressing

## 2020-02-20 NOTE — Plan of Care (Deleted)
  Problem: Clinical Measurements: Goal: Diagnostic test results will improve Outcome: Progressing Goal: Respiratory complications will improve Outcome: Progressing Goal: Cardiovascular complication will be avoided Outcome: Progressing   Problem: Activity: Goal: Risk for activity intolerance will decrease Outcome: Progressing   Problem: Nutrition: Goal: Adequate nutrition will be maintained Outcome: Progressing   Problem: Coping: Goal: Level of anxiety will decrease Outcome: Progressing   Problem: Elimination: Goal: Will not experience complications related to bowel motility Outcome: Progressing Goal: Will not experience complications related to urinary retention Outcome: Progressing   Problem: Pain Managment: Goal: General experience of comfort will improve Outcome: Progressing   Problem: Safety: Goal: Ability to remain free from injury will improve Outcome: Progressing   Problem: Skin Integrity: Goal: Risk for impaired skin integrity will decrease Outcome: Progressing   Problem: Clinical Measurements: Goal: Usual level of consciousness will be regained or maintained. Outcome: Progressing Goal: Neurologic status will improve Outcome: Progressing Goal: Ability to maintain intracranial pressure will improve Outcome: Progressing   Problem: Education: Goal: Knowledge of disease or condition will improve Outcome: Progressing Goal: Knowledge of secondary prevention will improve Outcome: Progressing Goal: Knowledge of patient specific risk factors addressed and post discharge goals established will improve Outcome: Progressing Goal: Individualized Educational Video(s) Outcome: Progressing   Problem: Coping: Goal: Will verbalize positive feelings about self Outcome: Progressing Goal: Will identify appropriate support needs Outcome: Progressing   Problem: Health Behavior/Discharge Planning: Goal: Ability to manage health-related needs will improve Outcome:  Progressing   Problem: Self-Care: Goal: Ability to participate in self-care as condition permits will improve Outcome: Progressing Goal: Ability to communicate needs accurately will improve Outcome: Progressing   Problem: Nutrition: Goal: Risk of aspiration will decrease Outcome: Progressing Goal: Dietary intake will improve Outcome: Progressing   Problem: Intracerebral Hemorrhage Tissue Perfusion: Goal: Complications of Intracerebral Hemorrhage will be minimized Outcome: Progressing

## 2020-02-20 NOTE — Progress Notes (Addendum)
Was unable to take call from patient's mother. Phone call was returned promptly. Voicemail was reached and left with message to return call when able.  [name] called back and was updated on patient status.

## 2020-02-20 NOTE — Progress Notes (Signed)
PROGRESS NOTE  Joshua [name] [id number] DOB: Jul 27, 1981 DOA: 09/09/2019 PCP: Patient, [name]  Brief History   39 year old African-American male admitted by neurosurgery on 09/09/2023 seizure, right hemiplegia and decreased responsiveness found to have left hemispheric intracranial hemorrhage, intubated admitted to ICU. Patient underwent craniotomy for decompression on 09/09/2019 by Dr. Vertell Limber, patient showed slow neurological recovery and prolonged hospitalization due to seizure and aspiration pneumonia. Patient continued to be awake but significant dysphagia, PEG tube was placed on 09/25/2019. Placement has been challenging as patient is uninsured and needs long-term care. Patient vomited tube feeding contents on the night of 01/06/2019. CT abdomen pelvis was negative for obstruction. Currently tolerating tube feeds well. Awaiting placement.  Consultants  . Trauma . PCCM . Neurosurgery . neurology  Procedures  . Intubation . Extubation . Mechanical ventilation . Craniotomy . PEG tube placement . Tube feeds  Antibiotics   Anti-infectives (From admission, onward)   Start     Dose/Rate Route Frequency Ordered Stop   09/27/19 1130  cephALEXin (KEFLEX) 250 MG/5ML suspension 500 mg     500 mg Per Tube Every 12 hours 09/27/19 1052 09/29/19 2250   09/25/19 1445  ceFAZolin (ANCEF) IVPB 2g/100 mL premix  Status:  Discontinued     2 g 200 mL/hr over 30 Minutes Intravenous Every 12 hours 09/25/19 1417 09/27/19 1052   09/14/19 1000  Ampicillin-Sulbactam (UNASYN) 3 g in sodium chloride 0.9 % 100 mL IVPB     3 g 200 mL/hr over 30 Minutes Intravenous Every 8 hours 09/14/19 0922 09/17/19 1816   09/13/19 1100  vancomycin (VANCOCIN) IVPB 750 mg/150 ml premix  Status:  Discontinued     750 mg 150 mL/hr over 60 Minutes Intravenous Every 24 hours 09/13/19 0728 09/13/19 0838   09/12/19 1100  vancomycin (VANCOCIN) IVPB 1000 mg/200 mL premix  Status:  Discontinued     1,000 mg 200 mL/hr over 60  Minutes Intravenous Every 24 hours 09/11/19 1034 09/13/19 0728   09/11/19 1130  piperacillin-tazobactam (ZOSYN) IVPB 3.375 g  Status:  Discontinued     3.375 g 12.5 mL/hr over 240 Minutes Intravenous Every 8 hours 09/11/19 1054 09/14/19 0845   09/11/19 1100  vancomycin (VANCOCIN) 1,500 mg in sodium chloride 0.9 % 500 mL IVPB     1,500 mg 250 mL/hr over 120 Minutes Intravenous  Once 09/11/19 1034 09/11/19 1900   09/11/19 1015  piperacillin-tazobactam (ZOSYN) IVPB 4.5 g  Status:  Discontinued     4.5 g 200 mL/hr over 30 Minutes Intravenous Every 8 hours 09/11/19 1010 09/11/19 1052   09/09/19 0845  ceFAZolin (ANCEF) IVPB 2g/100 mL premix     2 g 200 mL/hr over 30 Minutes Intravenous Every 8 hours 09/09/19 0842 09/09/19 1658     Subjective  The patient is lying in bed. He is not verbal. He refuses exam and all interventions.   Objective   Vitals:  Vitals:   02/20/20 1106 02/20/20 1503  BP: (!) 127/97 138/85  Pulse: 79 99  Resp: 17 16  Temp: 98.2 F (36.8 C) 97.7 F (36.5 C)  SpO2: 100% 100%    Exam:  Constitutional:  . Appears calm and comfortable until I attempt to examine him when he slaps my hand away. Respiratory:  . Respiratory effort normal. No retractions or accessory muscle use Abdomen:  Abdomen appears normal;  Musculoskeletal:  . Digits/nails BUE: no clubbing, cyanosis, petechiae, infection . No edema apparent Skin:  . No rashes, lesions, ulcers visualized. Neurologic:  . CN 2-12  intact . Sensation all 4 extremities intact . Patient is moving all extremities. Psychiatric:  . Mental status: Oppositional.  I have personally reviewed the following:   Today's Data  . Vitals. Refuses labs.  Scheduled Meds: . amLODipine  10 mg Per Tube Daily  . chlorhexidine  15 mL Mouth Rinse BID  . cholecalciferol  1,000 Units Oral Daily  . dextrose  1 Tube Oral Once  . dextrose  25 g Intravenous Once  . enoxaparin (LOVENOX) injection  40 mg Subcutaneous Daily  .  feeding supplement (JEVITY 1.2 CAL)  474 mL Per Tube 4x daily  . feeding supplement (PRO-STAT SUGAR FREE 64)  30 mL Per Tube QID  . folic acid  1 mg Per Tube Daily  . free water  200 mL Per Tube Q4H  . hydrALAZINE  25 mg Per Tube Q8H  . insulin aspart  0-15 Units Subcutaneous Q4H  . levETIRAcetam  500 mg Per Tube BID  . mouth rinse  15 mL Mouth Rinse q12n4p  . metoprolol tartrate  25 mg Per Tube BID  . multivitamin  15 mL Per Tube Daily  . QUEtiapine  50 mg Per Tube QHS  . thiamine  100 mg Per Tube Daily   Continuous Infusions:  Principal Problem:   ICH (intracerebral hemorrhage) (HCC) Active Problems:   Subdural hematoma (HCC)   Intracerebral hemorrhage (HCC)   Cytotoxic brain edema (HCC)   Hemorrhagic stroke (HCC)   Acute respiratory failure ([place])   Essential hypertension   Severe protein-calorie malnutrition ([place])   CKD (chronic kidney disease), stage IV (HCC)   Seizure ([place])   AKI (acute kidney injury) ([place])   LOS: 164 days   A & P  Left intracranial hemorrhage: likely from uncontrolled hypertension, status post decompressive craniectomy and hematoma evacuation on 09/09/2019.  CT head showed no vascular malformation.  Dr. Joaquim Lai did not recommend replacement of skull bone with lack of progression. Patient remains aphasic with right hemiplegia and severe cognitive impairment.  Seizure disorder: Continue Keppra, patient has been refusing medications.  No recent seizure activity.  Klebsiella UTI: Treatment with cefazolin completed.  Hypertension: Blood pressure is controlled, continue Norvasc, hydralazine, metoprolol.  Aspiration pneumonia: The patient has completed course of antibiotics.  Nutrition: Patient status post PEG tube placement, on tube feeding.  CKD stage III: Patient's creatinine was 1.23 as of 01/17/2020.  Pt is refusing further attempts to draw blood.  I have seen and examined this patient myself. I have spent 25 minutes in his evaluation and car.  DVT  Prophylaxis: Lovenox CODE STATUS: Full Code Family Communication: None available Disposition: SNF. Pt homeless  Rennee Coyne, DO [facility] Direct contact: see [email or web address]  7PM-7AM contact night coverage as above 02/20/2020, 3:09 PM  LOS: 158 days

## 2020-02-21 DIAGNOSIS — I611 Nontraumatic intracerebral hemorrhage in hemisphere, cortical: Secondary | ICD-10-CM | POA: Diagnosis not present

## 2020-02-21 LAB — GLUCOSE, CAPILLARY
Glucose-Capillary: 75 mg/dL (ref 70–99)
Glucose-Capillary: 84 mg/dL (ref 70–99)
Glucose-Capillary: 87 mg/dL (ref 70–99)
Glucose-Capillary: 93 mg/dL (ref 70–99)
Glucose-Capillary: 94 mg/dL (ref 70–99)
Glucose-Capillary: 95 mg/dL (ref 70–99)

## 2020-02-21 NOTE — Progress Notes (Signed)
Nutrition Follow-up  RD working remotely.   DOCUMENTATION CODES:   Severe malnutrition in context of acute illness/injury  INTERVENTION:  ContinueJevity 1.2 formul[place] PEG at goal volume of 474 ml (2 cartons/ARCs) given QID.   Provide 30 ml ProstatQID per tube.   Free water flushes of 200 ml every 4 hours per tube. (MD to adjust as appropriate)  Tube feeding to provide 2675 kcal, 165grams of protein, and 2773m free water.  Continue PO intake for comfort as appropriate/tolerated.  NUTRITION DIAGNOSIS:   Severe Malnutrition related to acute illness(intracerebral hemorrage) as evidenced by severe fat depletion, severe muscle depletion.  Ongoing.  GOAL:   Patient will meet greater than or equal to 90% of their needs  Met with TF.   MONITOR:   TF tolerance, Skin, Weight trends, Labs, I & O's  REASON FOR ASSESSMENT:   Ventilator, Consult Enteral/tube feeding initiation and management  ASSESSMENT:   39yo male admitted with large left intracranial hemorrhage; S/P craniotomy. PMH includes HTN, alcoholism, noncompliance.  9/12left frontal temporal craniectomy and evacuation hematoma.  9/23 Extubated.  10/1 PEG placed 11/23- Nutrition Focused Physical Exam performed, severe subcutaneous fat loss and severe muscle wasting identified.  Discussed pt with RN. Per RN, pt refusing bolus feedings on occasion.   Per MD, pt remains aphasic with right hemiplegia and severe cognitive impairment.   Medications reviewed and include: Vitamin D3, Glutose, Folvite, SSI, MVI, Vitamin B1 Labs reviewed.  Diet Order:   Diet Order            Diet regular Room service appropriate? Yes; Fluid consistency: Thin  Diet effective now              EDUCATION NEEDS:   Not appropriate for education at this time  Skin:  Skin Assessment: Skin Integrity Issues: Skin Integrity Issues:: Other (Comment) Other: skin tear right thigh  Last BM:  02/20/20  Height:   Ht Readings  from Last 1 Encounters:  09/09/19 '5\' 9"'  (1.753 m)    Weight:   Wt Readings from Last 1 Encounters:  02/21/20 60 kg    BMI:  Body mass index is 19.53 kg/m.  Estimated Nutritional Needs:   Kcal:  [address] Protein:  130-170 grams  Fluid:  >/= 2 L/day   ALarkin Ina MS, RD, LDN RD pager number and weekend/on-call pager number located in [phone]

## 2020-02-21 NOTE — Progress Notes (Addendum)
[facility] PROGRESS NOTE    Progress Note  Joshua [name]  [id number] DOB: 15-Mar-1981 DOA: 09/09/2019 PCP: Patient, [name]     Brief Narrative:   Joshua [name] is an 39 y.o. male admitted by neurosurgery on 09/09/2019 for seizures right hemiplegia and decreased responsiveness was found to have a left hemispheric intracranial hemorrhage intubated and admitted to the ICU underwent craniotomy for decompression on 09/09/2019 by Dr. Vertell Limber with a slow neurological recovery and prolonged hospitalization due to seizure and aspiration pneumonia continues to be awake but significantly dysphagia. A PEG tube was placed on 09/25/2019 disposition continues to be challenging as he is uninsured and needs long-term placement.  Assessment/Plan:   Left intracranial hemorrhage: Patient underwent decompressive craniotomy and hematoma evacuation on 09/09/2019 it was felt to be likely due to uncontrolled hypertension CT angio did not show vascular malformation. He is still aphasic nonverbal with right hemiplegia and significant cognitive impairment Dr. Vertell Limber did not recommend replacement of his skull bone due to lack of progression.  Seizure disorder: Continue Keppra.  Klebsiella UTI: He completed his course of cefazolin.  Essential hypertension: Blood pressure controlled continue Norvasc amlodipine and metoprolol.  Aspiration pneumonia: He completed his course of antibiotics.  Nutrition: He is status post PEG tube placement currently on tube feedings.  Chronic kidney disease stage III: Patient has been refusing his blood drawn.   DVT prophylaxis: lovenox Family Communication:none Disposition Plan/Barrier to D/C: Awaiting skilled nursing facility patient is homeless.  Code Status:     Code Status Orders  (From admission, onward)         Start     Ordered   09/09/19 0843  Full code  Continuous     09/09/19 0842        Code Status History    This patient has a current code  status but no historical code status.   Advance Care Planning Activity        IV Access:    Peripheral IV   Procedures and diagnostic studies:   No results found.   Medical Consultants:    Trauma  PCCM  Neurosurgery  Neurology  Anti-Infectives:   none  Subjective:    Joshua [name] is laying in bed nonverbal refuses physical exam  Objective:    Vitals:   02/20/20 2358 02/21/20 0327 02/21/20 0500 02/21/20 0734  BP: 119/88 (!) 123/99  (!) 122/104  Pulse: 91 76  74  Resp: 18 18  18   Temp: 98.9 F (37.2 C) 97.7 F (36.5 C)  97.6 F (36.4 C)  TempSrc: Oral Oral  Oral  SpO2: 99% 99%  99%  Weight:   60 kg   Height:       SpO2: 99 % O2 Flow Rate (L/min): 0 L/min FiO2 (%): 40 %   Intake/Output Summary (Last 24 hours) at 02/21/2020 1129 Last data filed at 02/20/2020 1700 Gross per 24 hour  Intake 1774 ml  Output 20 ml  Net 1754 ml   Filed Weights   02/20/20 0500 02/20/20 1948 02/21/20 0500  Weight: 58 kg 60 kg 60 kg    Exam: General exam: In no acute distress.    Data Reviewed:    Labs: Basic Metabolic Panel: No results for input(s): NA, K, CL, CO2, GLUCOSE, BUN, CREATININE, CALCIUM, MG, PHOS in the last 168 hours. GFR CrCl cannot be calculated (Patient's most recent lab result is older than the maximum 21 days allowed.). Liver Function Tests: No results for input(s): AST, ALT, ALKPHOS, BILITOT,  PROT, ALBUMIN in the last 168 hours. No results for input(s): LIPASE, AMYLASE in the last 168 hours. No results for input(s): AMMONIA in the last 168 hours. Coagulation profile No results for input(s): INR, PROTIME in the last 168 hours. COVID-19 Labs  No results for input(s): DDIMER, FERRITIN, LDH, CRP in the last 72 hours.  Lab Results  Component Value Date   [place] NEGATIVE 09/09/2019    CBC: No results for input(s): WBC, NEUTROABS, HGB, HCT, MCV, PLT in the last 168 hours. Cardiac Enzymes: No results for input(s): CKTOTAL,  CKMB, CKMBINDEX, TROPONINI in the last 168 hours. BNP (last 3 results) No results for input(s): PROBNP in the last 8760 hours. CBG: Recent Labs  Lab 02/20/20 1506 02/20/20 2031 02/21/20 0024 02/21/20 0402 02/21/20 0736  GLUCAP 105* 80 95 94 93   D-Dimer: No results for input(s): DDIMER in the last 72 hours. Hgb A1c: No results for input(s): HGBA1C in the last 72 hours. Lipid Profile: No results for input(s): CHOL, HDL, LDLCALC, TRIG, CHOLHDL, LDLDIRECT in the last 72 hours. Thyroid function studies: No results for input(s): TSH, T4TOTAL, T3FREE, THYROIDAB in the last 72 hours.  Invalid input(s): FREET3 Anemia work up: No results for input(s): VITAMINB12, FOLATE, FERRITIN, TIBC, IRON, RETICCTPCT in the last 72 hours. Sepsis Labs: No results for input(s): PROCALCITON, WBC, LATICACIDVEN in the last 168 hours. Microbiology No results found for this or any previous visit (from the past 240 hour(s)).   Medications:   . amLODipine  10 mg Per Tube Daily  . chlorhexidine  15 mL Mouth Rinse BID  . cholecalciferol  1,000 Units Oral Daily  . dextrose  1 Tube Oral Once  . dextrose  25 g Intravenous Once  . enoxaparin (LOVENOX) injection  40 mg Subcutaneous Daily  . feeding supplement (JEVITY 1.2 CAL)  474 mL Per Tube 4x daily  . feeding supplement (PRO-STAT SUGAR FREE 64)  30 mL Per Tube QID  . folic acid  1 mg Per Tube Daily  . free water  200 mL Per Tube Q4H  . hydrALAZINE  25 mg Per Tube Q8H  . insulin aspart  0-15 Units Subcutaneous Q4H  . levETIRAcetam  500 mg Per Tube BID  . mouth rinse  15 mL Mouth Rinse q12n4p  . metoprolol tartrate  25 mg Per Tube BID  . multivitamin  15 mL Per Tube Daily  . QUEtiapine  50 mg Per Tube QHS  . thiamine  100 mg Per Tube Daily   Continuous Infusions:    LOS: 165 days   Charlynne Cousins  [facility]  02/21/2020, 11:29 AM

## 2020-02-22 DIAGNOSIS — I611 Nontraumatic intracerebral hemorrhage in hemisphere, cortical: Secondary | ICD-10-CM | POA: Diagnosis not present

## 2020-02-22 LAB — GLUCOSE, CAPILLARY
Glucose-Capillary: 81 mg/dL (ref 70–99)
Glucose-Capillary: 83 mg/dL (ref 70–99)
Glucose-Capillary: 87 mg/dL (ref 70–99)
Glucose-Capillary: 89 mg/dL (ref 70–99)
Glucose-Capillary: 99 mg/dL (ref 70–99)

## 2020-02-22 NOTE — Progress Notes (Signed)
Pt refused evening feedings and water. Mother updated and aware. Advised someone to talk with him and see if someone can help him change his mind. Voiced understanding. Pt was agreeable to getting a bath and oral care in the morning.

## 2020-02-22 NOTE — Progress Notes (Addendum)
[facility] PROGRESS NOTE    Progress Note  Joshua [name]  [id number] DOB: Dec 20, 1981 DOA: 09/09/2019 PCP: Patient, [name]     Brief Narrative:   Joshua [name] is an 39 y.o. male admitted by neurosurgery on 09/09/2019 for seizures right hemiplegia and decreased responsiveness was found to have a left hemispheric intracranial hemorrhage intubated and admitted to the ICU underwent craniotomy for decompression on 09/09/2019 by Dr. Vertell Limber with a slow neurological recovery and prolonged hospitalization due to seizure and aspiration pneumonia continues to be awake but significantly dysphagia. A PEG tube was placed on 09/25/2019 disposition continues to be challenging as he is uninsured and needs long-term placement.  Assessment/Plan:   Left intracranial hemorrhage: Patient underwent decompressive craniotomy and hematoma evacuation on 09/09/2019 it was felt to be likely due to uncontrolled hypertension CT angio did not show vascular malformation. He is still aphasic nonverbal with right hemiplegia and significant cognitive impairment Dr. Vertell Limber did not recommend replacement of his skull bone due to lack of progression.  Seizure disorder: Continue Keppra.  Klebsiella UTI: He completed his course of cefazolin.  Essential hypertension: Blood pressure controlled continue Norvasc amlodipine and metoprolol.  Aspiration pneumonia: He completed his course of antibiotics.  Nutrition: He is status post PEG tube placement currently on tube feedings.  Chronic kidney disease stage III: Patient has been refusing his blood drawn.   DVT prophylaxis: lovenox Family Communication:none Disposition Plan/Barrier to D/C: Awaiting skilled nursing facility patient is homeless.  Code Status:     Code Status Orders  (From admission, onward)         Start     Ordered   09/09/19 0843  Full code  Continuous     09/09/19 0842        Code Status History    This patient has a current code  status but no historical code status.   Advance Care Planning Activity        IV Access:    Peripheral IV   Procedures and diagnostic studies:   No results found.   Medical Consultants:    Trauma  PCCM  Neurosurgery  Neurology  Anti-Infectives:   none  Subjective:    Joshua [name] is laying in bed nonverbal.  Objective:    Vitals:   02/21/20 2012 02/21/20 2344 02/22/20 0401 02/22/20 0735  BP: 140/89 (!) 148/101 (!) 125/96 121/87  Pulse: (!) 110 95 87 (!) 104  Resp: 17 18 19 16   Temp: 98.1 F (36.7 C) 97.9 F (36.6 C) 98.3 F (36.8 C) 98.4 F (36.9 C)  TempSrc: Oral Oral Oral Oral  SpO2: 95% 98% 100% 98%  Weight:      Height:       SpO2: 98 % O2 Flow Rate (L/min): 0 L/min FiO2 (%): 40 %   Intake/Output Summary (Last 24 hours) at 02/22/2020 0914 Last data filed at 02/21/2020 1730 Gross per 24 hour  Intake 0 ml  Output --  Net 0 ml   Filed Weights   02/20/20 0500 02/20/20 1948 02/21/20 0500  Weight: 58 kg 60 kg 60 kg    Exam: General exam: In no acute distress. Respiratory system: Good air movement and clear to auscultation. Cardiovascular system: S1 & S2 heard, RRR. No JVD. Extremities: No pedal edema. Skin: No rashes, lesions or ulcers   Data Reviewed:    Labs: Basic Metabolic Panel: No results for input(s): NA, K, CL, CO2, GLUCOSE, BUN, CREATININE, CALCIUM, MG, PHOS in the last 168 hours. GFR CrCl  cannot be calculated (Patient's most recent lab result is older than the maximum 21 days allowed.). Liver Function Tests: No results for input(s): AST, ALT, ALKPHOS, BILITOT, PROT, ALBUMIN in the last 168 hours. No results for input(s): LIPASE, AMYLASE in the last 168 hours. No results for input(s): AMMONIA in the last 168 hours. Coagulation profile No results for input(s): INR, PROTIME in the last 168 hours. COVID-19 Labs  No results for input(s): DDIMER, FERRITIN, LDH, CRP in the last 72 hours.  Lab Results  Component Value  Date   [place] NEGATIVE 09/09/2019    CBC: No results for input(s): WBC, NEUTROABS, HGB, HCT, MCV, PLT in the last 168 hours. Cardiac Enzymes: No results for input(s): CKTOTAL, CKMB, CKMBINDEX, TROPONINI in the last 168 hours. BNP (last 3 results) No results for input(s): PROBNP in the last 8760 hours. CBG: Recent Labs  Lab 02/21/20 1539 02/21/20 2045 02/22/20 0017 02/22/20 0456 02/22/20 0801  GLUCAP 84 75 99 89 87   D-Dimer: No results for input(s): DDIMER in the last 72 hours. Hgb A1c: No results for input(s): HGBA1C in the last 72 hours. Lipid Profile: No results for input(s): CHOL, HDL, LDLCALC, TRIG, CHOLHDL, LDLDIRECT in the last 72 hours. Thyroid function studies: No results for input(s): TSH, T4TOTAL, T3FREE, THYROIDAB in the last 72 hours.  Invalid input(s): FREET3 Anemia work up: No results for input(s): VITAMINB12, FOLATE, FERRITIN, TIBC, IRON, RETICCTPCT in the last 72 hours. Sepsis Labs: No results for input(s): PROCALCITON, WBC, LATICACIDVEN in the last 168 hours. Microbiology No results found for this or any previous visit (from the past 240 hour(s)).   Medications:   . amLODipine  10 mg Per Tube Daily  . chlorhexidine  15 mL Mouth Rinse BID  . cholecalciferol  1,000 Units Oral Daily  . dextrose  1 Tube Oral Once  . dextrose  25 g Intravenous Once  . enoxaparin (LOVENOX) injection  40 mg Subcutaneous Daily  . feeding supplement (JEVITY 1.2 CAL)  474 mL Per Tube 4x daily  . feeding supplement (PRO-STAT SUGAR FREE 64)  30 mL Per Tube QID  . folic acid  1 mg Per Tube Daily  . free water  200 mL Per Tube Q4H  . hydrALAZINE  25 mg Per Tube Q8H  . insulin aspart  0-15 Units Subcutaneous Q4H  . levETIRAcetam  500 mg Per Tube BID  . mouth rinse  15 mL Mouth Rinse q12n4p  . metoprolol tartrate  25 mg Per Tube BID  . multivitamin  15 mL Per Tube Daily  . QUEtiapine  50 mg Per Tube QHS  . thiamine  100 mg Per Tube Daily   Continuous Infusions:     LOS: 166 days   Charlynne Cousins  [facility]  02/22/2020, 9:14 AM

## 2020-02-23 LAB — GLUCOSE, CAPILLARY
Glucose-Capillary: 192 mg/dL — ABNORMAL HIGH (ref 70–99)
Glucose-Capillary: 85 mg/dL (ref 70–99)
Glucose-Capillary: 86 mg/dL (ref 70–99)
Glucose-Capillary: 115 mg/dL — ABNORMAL HIGH (ref 70–99)
Glucose-Capillary: 83 mg/dL (ref 70–99)
Glucose-Capillary: 159 mg/dL — ABNORMAL HIGH (ref 70–99)
Glucose-Capillary: 97 mg/dL (ref 70–99)

## 2020-02-24 DIAGNOSIS — I611 Nontraumatic intracerebral hemorrhage in hemisphere, cortical: Secondary | ICD-10-CM | POA: Diagnosis not present

## 2020-02-24 LAB — GLUCOSE, CAPILLARY: Glucose-Capillary: 102 mg/dL — ABNORMAL HIGH (ref 70–99)

## 2020-02-24 NOTE — Progress Notes (Addendum)
[facility] PROGRESS NOTE    Progress Note  Joshua [name]  [id number] DOB: 09-15-1981 DOA: 09/09/2019 PCP: Patient, [name]     Brief Narrative:   Joshua [name] is an 39 y.o. male admitted by neurosurgery on 09/09/2019 for seizures right hemiplegia and decreased responsiveness was found to have a left hemispheric intracranial hemorrhage intubated and admitted to the ICU underwent craniotomy for decompression on 09/09/2019 by Dr. Vertell Limber with a slow neurological recovery and prolonged hospitalization due to seizure and aspiration pneumonia continues to be awake but significantly dysphagia. A PEG tube was placed on 09/25/2019 disposition continues to be challenging as he is uninsured and needs long-term placement.  Assessment/Plan:   Left intracranial hemorrhage: Patient underwent decompressive craniotomy and hematoma evacuation on 09/09/2019 it was felt to be likely due to uncontrolled hypertension CT angio did not show vascular malformation. He is still aphasic nonverbal with right hemiplegia and significant cognitive impairment Dr. Vertell Limber did not recommend replacement of his skull bone due to lack of progression.  Seizure disorder: Continue Keppra.  Klebsiella UTI: He completed his course of cefazolin.  Essential hypertension: Blood pressure controlled continue Norvasc amlodipine and metoprolol.  Aspiration pneumonia: He completed his course of antibiotics.  Nutrition: He is status post PEG tube placement currently on tube feedings.  Chronic kidney disease stage III: Patient has been refusing his blood drawn.   DVT prophylaxis: lovenox Family Communication:none Disposition Plan/Barrier to D/C: Awaiting skilled nursing facility patient is homeless.  Code Status:     Code Status Orders  (From admission, onward)         Start     Ordered   09/09/19 0843  Full code  Continuous     09/09/19 0842        Code Status History    This patient has a current code  status but no historical code status.   Advance Care Planning Activity        IV Access:    Peripheral IV   Procedures and diagnostic studies:   No results found.   Medical Consultants:    Trauma  PCCM  Neurosurgery  Neurology  Anti-Infectives:   none  Subjective:    Joshua [name] is laying in bed nonverbal refuses physical exam  Objective:    Vitals:   02/23/20 1610 02/23/20 2000 02/23/20 2253 02/23/20 2357  BP: (!) 132/92 (!) 139/101 (!) 162/102 (!) 132/95  Pulse: (!) 104 (!) 105 71 (!) 107  Resp: 14 16 18 16   Temp: 98.3 F (36.8 C) 98.5 F (36.9 C) 97.7 F (36.5 C) 98.8 F (37.1 C)  TempSrc: Oral  Oral Oral  SpO2: 100% 100% 98% (!) 87%  Weight:      Height:       SpO2: (!) 87 % O2 Flow Rate (L/min): 0 L/min FiO2 (%): 40 %  No intake or output data in the 24 hours ending 02/24/20 1030 Filed Weights   02/20/20 0500 02/20/20 1948 02/21/20 0500  Weight: 58 kg 60 kg 60 kg    Exam: General exam: In no acute distress. Respiratory system: Good air movement and clear to auscultation. Cardiovascular system: S1 & S2 heard, RRR. No JVD. Gastrointestinal system: Abdomen is nondistended, soft and nontender.  Extremities: No pedal edema. Skin: No rashes, lesions or ulcers   Data Reviewed:    Labs: Basic Metabolic Panel: No results for input(s): NA, K, CL, CO2, GLUCOSE, BUN, CREATININE, CALCIUM, MG, PHOS in the last 168 hours. GFR CrCl cannot be calculated (  Patient's most recent lab result is older than the maximum 21 days allowed.). Liver Function Tests: No results for input(s): AST, ALT, ALKPHOS, BILITOT, PROT, ALBUMIN in the last 168 hours. No results for input(s): LIPASE, AMYLASE in the last 168 hours. No results for input(s): AMMONIA in the last 168 hours. Coagulation profile No results for input(s): INR, PROTIME in the last 168 hours. COVID-19 Labs  No results for input(s): DDIMER, FERRITIN, LDH, CRP in the last 72 hours.  Lab  Results  Component Value Date   [place] NEGATIVE 09/09/2019    CBC: No results for input(s): WBC, NEUTROABS, HGB, HCT, MCV, PLT in the last 168 hours. Cardiac Enzymes: No results for input(s): CKTOTAL, CKMB, CKMBINDEX, TROPONINI in the last 168 hours. BNP (last 3 results) No results for input(s): PROBNP in the last 8760 hours. CBG: Recent Labs  Lab 02/23/20 0851 02/23/20 1206 02/23/20 1612 02/23/20 2110 02/23/20 2359  GLUCAP 115* 83 159* 97 102*   D-Dimer: No results for input(s): DDIMER in the last 72 hours. Hgb A1c: No results for input(s): HGBA1C in the last 72 hours. Lipid Profile: No results for input(s): CHOL, HDL, LDLCALC, TRIG, CHOLHDL, LDLDIRECT in the last 72 hours. Thyroid function studies: No results for input(s): TSH, T4TOTAL, T3FREE, THYROIDAB in the last 72 hours.  Invalid input(s): FREET3 Anemia work up: No results for input(s): VITAMINB12, FOLATE, FERRITIN, TIBC, IRON, RETICCTPCT in the last 72 hours. Sepsis Labs: No results for input(s): PROCALCITON, WBC, LATICACIDVEN in the last 168 hours. Microbiology No results found for this or any previous visit (from the past 240 hour(s)).   Medications:   . amLODipine  10 mg Per Tube Daily  . chlorhexidine  15 mL Mouth Rinse BID  . cholecalciferol  1,000 Units Oral Daily  . dextrose  1 Tube Oral Once  . dextrose  25 g Intravenous Once  . enoxaparin (LOVENOX) injection  40 mg Subcutaneous Daily  . feeding supplement (JEVITY 1.2 CAL)  474 mL Per Tube 4x daily  . feeding supplement (PRO-STAT Joshua FREE 64)  30 mL Per Tube QID  . folic acid  1 mg Per Tube Daily  . free water  200 mL Per Tube Q4H  . hydrALAZINE  25 mg Per Tube Q8H  . insulin aspart  0-15 Units Subcutaneous Q4H  . levETIRAcetam  500 mg Per Tube BID  . mouth rinse  15 mL Mouth Rinse q12n4p  . metoprolol tartrate  25 mg Per Tube BID  . multivitamin  15 mL Per Tube Daily  . QUEtiapine  50 mg Per Tube QHS  . thiamine  100 mg Per Tube Daily     Continuous Infusions:    LOS: 168 days   Charlynne Cousins  [facility]  02/24/2020, 10:30 AM

## 2020-02-25 DIAGNOSIS — I611 Nontraumatic intracerebral hemorrhage in hemisphere, cortical: Secondary | ICD-10-CM | POA: Diagnosis not present

## 2020-02-25 NOTE — Progress Notes (Addendum)
[facility] PROGRESS NOTE    Progress Note  Joshua [name]  [id number] DOB: [birth year] DOA: 09/09/2019 PCP: Patient, [name]     Brief Narrative:   Joshua [name] is an 39 y.o. male admitted by neurosurgery on 09/09/2019 for seizures right hemiplegia and decreased responsiveness was found to have a left hemispheric intracranial hemorrhage intubated and admitted to the ICU underwent craniotomy for decompression on 09/09/2019 by Dr. Vertell Limber with a slow neurological recovery and prolonged hospitalization due to seizure and aspiration pneumonia continues to be awake but significantly dysphagia. A PEG tube was placed on 09/25/2019 disposition continues to be challenging as he is uninsured and needs long-term placement.  Assessment/Plan:   Left intracranial hemorrhage: Patient underwent decompressive craniotomy and hematoma evacuation on 09/09/2019 it was felt to be likely due to uncontrolled hypertension CT angio did not show vascular malformation. He is still aphasic nonverbal with right hemiplegia and significant cognitive impairment Dr. Vertell Limber did not recommend replacement of his skull bone due to lack of progression.  Seizure disorder: Continue Keppra.  Klebsiella UTI: He completed his course of cefazolin.  Essential hypertension: Blood pressure controlled continue Norvasc amlodipine and metoprolol.  Aspiration pneumonia: He completed his course of antibiotics.  Nutrition: He is status post PEG tube placement currently on tube feedings.  Chronic kidney disease stage III: Patient has been refusing his blood drawn.   DVT prophylaxis: lovenox Family Communication:none Disposition Plan/Barrier to D/C: Awaiting skilled nursing facility patient is homeless.  Code Status:     Code Status Orders  (From admission, onward)         Start     Ordered   09/09/19 0843  Full code  Continuous     09/09/19 0842        Code Status History    This patient has a current code  status but no historical code status.   Advance Care Planning Activity        IV Access:    Peripheral IV   Procedures and diagnostic studies:   No results found.   Medical Consultants:    Trauma  PCCM  Neurosurgery  Neurology  Anti-Infectives:   none  Subjective:    Joshua [name] is laying in bed nonverbal refuses physical exam  Objective:    Vitals:   02/25/20 0007 02/25/20 0345 02/25/20 0404 02/25/20 0406  BP: (!) 131/99 (!) 125/103 (!) 144/94   Pulse: 90 96 97   Resp: 16 18    Temp: 97.6 F (36.4 C) 98.7 F (37.1 C)    TempSrc: Oral Oral    SpO2: 100% 100%    Weight:    59 kg  Height:       SpO2: 100 % O2 Flow Rate (L/min): 0 L/min FiO2 (%): 40 %   Intake/Output Summary (Last 24 hours) at 02/25/2020 0941 Last data filed at 02/25/2020 0405 Gross per 24 hour  Intake 1074 ml  Output --  Net 1074 ml   Filed Weights   02/20/20 1948 02/21/20 0500 02/25/20 0406  Weight: 60 kg 60 kg 59 kg    Exam: General exam: In no acute distress. Respiratory system: Good air movement and clear to auscultation. Cardiovascular system: S1 & S2 heard, RRR. No JVD.  Gastrointestinal system: Abdomen is nondistended, soft and nontender.  Extremities: No pedal edema. Skin: No rashes, lesions or ulcers   Data Reviewed:    Labs: Basic Metabolic Panel: No results for input(s): NA, K, CL, CO2, GLUCOSE, BUN, CREATININE, CALCIUM, MG, PHOS  in the last 168 hours. GFR CrCl cannot be calculated (Patient's most recent lab result is older than the maximum 21 days allowed.). Liver Function Tests: No results for input(s): AST, ALT, ALKPHOS, BILITOT, PROT, ALBUMIN in the last 168 hours. No results for input(s): LIPASE, AMYLASE in the last 168 hours. No results for input(s): AMMONIA in the last 168 hours. Coagulation profile No results for input(s): INR, PROTIME in the last 168 hours. COVID-19 Labs  No results for input(s): DDIMER, FERRITIN, LDH, CRP in the last 72  hours.  Lab Results  Component Value Date   [place] NEGATIVE 09/09/2019    CBC: No results for input(s): WBC, NEUTROABS, HGB, HCT, MCV, PLT in the last 168 hours. Cardiac Enzymes: No results for input(s): CKTOTAL, CKMB, CKMBINDEX, TROPONINI in the last 168 hours. BNP (last 3 results) No results for input(s): PROBNP in the last 8760 hours. CBG: Recent Labs  Lab 02/23/20 0851 02/23/20 1206 02/23/20 1612 02/23/20 2110 02/23/20 2359  GLUCAP 115* 83 159* 97 102*   D-Dimer: No results for input(s): DDIMER in the last 72 hours. Hgb A1c: No results for input(s): HGBA1C in the last 72 hours. Lipid Profile: No results for input(s): CHOL, HDL, LDLCALC, TRIG, CHOLHDL, LDLDIRECT in the last 72 hours. Thyroid function studies: No results for input(s): TSH, T4TOTAL, T3FREE, THYROIDAB in the last 72 hours.  Invalid input(s): FREET3 Anemia work up: No results for input(s): VITAMINB12, FOLATE, FERRITIN, TIBC, IRON, RETICCTPCT in the last 72 hours. Sepsis Labs: No results for input(s): PROCALCITON, WBC, LATICACIDVEN in the last 168 hours. Microbiology No results found for this or any previous visit (from the past 240 hour(s)).   Medications:   . amLODipine  10 mg Per Tube Daily  . chlorhexidine  15 mL Mouth Rinse BID  . cholecalciferol  1,000 Units Oral Daily  . dextrose  1 Tube Oral Once  . dextrose  25 g Intravenous Once  . enoxaparin (LOVENOX) injection  40 mg Subcutaneous Daily  . feeding supplement (JEVITY 1.2 CAL)  474 mL Per Tube 4x daily  . feeding supplement (PRO-STAT SUGAR FREE 64)  30 mL Per Tube QID  . folic acid  1 mg Per Tube Daily  . free water  200 mL Per Tube Q4H  . hydrALAZINE  25 mg Per Tube Q8H  . insulin aspart  0-15 Units Subcutaneous Q4H  . levETIRAcetam  500 mg Per Tube BID  . mouth rinse  15 mL Mouth Rinse q12n4p  . metoprolol tartrate  25 mg Per Tube BID  . multivitamin  15 mL Per Tube Daily  . QUEtiapine  50 mg Per Tube QHS  . thiamine  100 mg  Per Tube Daily   Continuous Infusions:    LOS: 169 days   Charlynne Cousins  [facility]  02/25/2020, 9:41 AM

## 2020-02-26 DIAGNOSIS — I611 Nontraumatic intracerebral hemorrhage in hemisphere, cortical: Secondary | ICD-10-CM | POA: Diagnosis not present

## 2020-02-26 LAB — GLUCOSE, CAPILLARY
Glucose-Capillary: 100 mg/dL — ABNORMAL HIGH (ref 70–99)
Glucose-Capillary: 112 mg/dL — ABNORMAL HIGH (ref 70–99)
Glucose-Capillary: 114 mg/dL — ABNORMAL HIGH (ref 70–99)
Glucose-Capillary: 114 mg/dL — ABNORMAL HIGH (ref 70–99)
Glucose-Capillary: 116 mg/dL — ABNORMAL HIGH (ref 70–99)
Glucose-Capillary: 136 mg/dL — ABNORMAL HIGH (ref 70–99)
Glucose-Capillary: 143 mg/dL — ABNORMAL HIGH (ref 70–99)
Glucose-Capillary: 145 mg/dL — ABNORMAL HIGH (ref 70–99)
Glucose-Capillary: 145 mg/dL — ABNORMAL HIGH (ref 70–99)
Glucose-Capillary: 159 mg/dL — ABNORMAL HIGH (ref 70–99)
Glucose-Capillary: 77 mg/dL (ref 70–99)
Glucose-Capillary: 83 mg/dL (ref 70–99)
Glucose-Capillary: 90 mg/dL (ref 70–99)
Glucose-Capillary: 95 mg/dL (ref 70–99)
Glucose-Capillary: 83 mg/dL (ref 70–99)
Glucose-Capillary: 83 mg/dL (ref 70–99)

## 2020-02-26 NOTE — Progress Notes (Addendum)
[facility] PROGRESS NOTE    Progress Note  Joshua [name]  [id number] DOB: 1981/07/09 DOA: 09/09/2019 PCP: Patient, [name]     Brief Narrative:   Joshua [name] is an 39 y.o. male admitted by neurosurgery on 09/09/2019 for seizures right hemiplegia and decreased responsiveness was found to have a left hemispheric intracranial hemorrhage intubated and admitted to the ICU underwent craniotomy for decompression on 09/09/2019 by Dr. Vertell Limber with a slow neurological recovery and prolonged hospitalization due to seizure and aspiration pneumonia continues to be awake but significantly dysphagia. A PEG tube was placed on 09/25/2019 disposition continues to be challenging as he is uninsured and needs long-term placement.  Assessment/Plan:   Left intracranial hemorrhage: Patient underwent decompressive craniotomy and hematoma evacuation on 09/09/2019 it was felt to be likely due to uncontrolled hypertension CT angio did not show vascular malformation. He is still aphasic nonverbal with right hemiplegia and significant cognitive impairment Dr. Vertell Limber did not recommend replacement of his skull bone due to lack of progression.  Seizure disorder: Continue Keppra.  Klebsiella UTI: He completed his course of cefazolin.  Essential hypertension: Blood pressure controlled continue Norvasc amlodipine and metoprolol.  Aspiration pneumonia: He completed his course of antibiotics.  Nutrition: He is status post PEG tube placement currently on tube feedings.  Chronic kidney disease stage III: Patient has been refusing his blood drawn.   DVT prophylaxis: lovenox Family Communication:none Disposition Plan/Barrier to D/C: Still awaiting placement.  Code Status:     Code Status Orders  (From admission, onward)         Start     Ordered   09/09/19 0843  Full code  Continuous     09/09/19 0842        Code Status History    This patient has a current code status but no historical code  status.   Advance Care Planning Activity        IV Access:    Peripheral IV   Procedures and diagnostic studies:   No results found.   Medical Consultants:    Trauma  PCCM  Neurosurgery  Neurology  Anti-Infectives:   none  Subjective:    Joshua [name] is laying in bed nonverbal refuses physical exam  Objective:    Vitals:   02/25/20 1936 02/25/20 2331 02/26/20 0349 02/26/20 0800  BP: (!) 136/100 (!) 140/104 126/81 126/88  Pulse: 93 91 95 84  Resp: 18 18 18 18   Temp: 98.6 F (37 C) 98.2 F (36.8 C) 98.5 F (36.9 C) 98.2 F (36.8 C)  TempSrc: Oral Oral Oral Oral  SpO2: 100% 100% 100% 100%  Weight:      Height:       SpO2: 100 % O2 Flow Rate (L/min): 0 L/min FiO2 (%): 40 %  No intake or output data in the 24 hours ending 02/26/20 1047 Filed Weights   02/20/20 1948 02/21/20 0500 02/25/20 0406  Weight: 60 kg 60 kg 59 kg    Exam: General exam: In no acute distress. Respiratory system: Good air movement and clear to auscultation. Cardiovascular system: S1 & S2 heard, RRR. No JVD. Gastrointestinal system: Abdomen is nondistended, soft and nontender.  Extremities: No pedal edema. Skin: No rashes, lesions or ulcers   Data Reviewed:    Labs: Basic Metabolic Panel: No results for input(s): NA, K, CL, CO2, GLUCOSE, BUN, CREATININE, CALCIUM, MG, PHOS in the last 168 hours. GFR CrCl cannot be calculated (Patient's most recent lab result is older than the maximum 21  days allowed.). Liver Function Tests: No results for input(s): AST, ALT, ALKPHOS, BILITOT, PROT, ALBUMIN in the last 168 hours. No results for input(s): LIPASE, AMYLASE in the last 168 hours. No results for input(s): AMMONIA in the last 168 hours. Coagulation profile No results for input(s): INR, PROTIME in the last 168 hours. COVID-19 Labs  No results for input(s): DDIMER, FERRITIN, LDH, CRP in the last 72 hours.  Lab Results  Component Value Date   [place] NEGATIVE  09/09/2019    CBC: No results for input(s): WBC, NEUTROABS, HGB, HCT, MCV, PLT in the last 168 hours. Cardiac Enzymes: No results for input(s): CKTOTAL, CKMB, CKMBINDEX, TROPONINI in the last 168 hours. BNP (last 3 results) No results for input(s): PROBNP in the last 8760 hours. CBG: Recent Labs  Lab 02/23/20 0851 02/23/20 1206 02/23/20 1612 02/23/20 2110 02/23/20 2359  GLUCAP 115* 83 159* 97 102*   D-Dimer: No results for input(s): DDIMER in the last 72 hours. Hgb A1c: No results for input(s): HGBA1C in the last 72 hours. Lipid Profile: No results for input(s): CHOL, HDL, LDLCALC, TRIG, CHOLHDL, LDLDIRECT in the last 72 hours. Thyroid function studies: No results for input(s): TSH, T4TOTAL, T3FREE, THYROIDAB in the last 72 hours.  Invalid input(s): FREET3 Anemia work up: No results for input(s): VITAMINB12, FOLATE, FERRITIN, TIBC, IRON, RETICCTPCT in the last 72 hours. Sepsis Labs: No results for input(s): PROCALCITON, WBC, LATICACIDVEN in the last 168 hours. Microbiology No results found for this or any previous visit (from the past 240 hour(s)).   Medications:   . amLODipine  10 mg Per Tube Daily  . chlorhexidine  15 mL Mouth Rinse BID  . cholecalciferol  1,000 Units Oral Daily  . dextrose  1 Tube Oral Once  . dextrose  25 g Intravenous Once  . enoxaparin (LOVENOX) injection  40 mg Subcutaneous Daily  . feeding supplement (JEVITY 1.2 CAL)  474 mL Per Tube 4x daily  . feeding supplement (PRO-STAT SUGAR FREE 64)  30 mL Per Tube QID  . folic acid  1 mg Per Tube Daily  . free water  200 mL Per Tube Q4H  . hydrALAZINE  25 mg Per Tube Q8H  . insulin aspart  0-15 Units Subcutaneous Q4H  . levETIRAcetam  500 mg Per Tube BID  . mouth rinse  15 mL Mouth Rinse q12n4p  . metoprolol tartrate  25 mg Per Tube BID  . multivitamin  15 mL Per Tube Daily  . QUEtiapine  50 mg Per Tube QHS  . thiamine  100 mg Per Tube Daily   Continuous Infusions:    LOS: 170 days    Charlynne Cousins  [facility]  02/26/2020, 10:47 AM

## 2020-02-27 DIAGNOSIS — I611 Nontraumatic intracerebral hemorrhage in hemisphere, cortical: Secondary | ICD-10-CM | POA: Diagnosis not present

## 2020-02-27 LAB — GLUCOSE, CAPILLARY
Glucose-Capillary: 103 mg/dL — ABNORMAL HIGH (ref 70–99)
Glucose-Capillary: 114 mg/dL — ABNORMAL HIGH (ref 70–99)
Glucose-Capillary: 164 mg/dL — ABNORMAL HIGH (ref 70–99)
Glucose-Capillary: 81 mg/dL (ref 70–99)
Glucose-Capillary: 85 mg/dL (ref 70–99)
Glucose-Capillary: 87 mg/dL (ref 70–99)

## 2020-02-27 NOTE — Progress Notes (Signed)
[facility] PROGRESS NOTE    Progress Note  Joshua [name]  [id number] DOB: 03/01/1981 DOA: 09/09/2019 PCP: Patient, [name]     Brief Narrative:   Joshua [name] is an 39 y.o. male admitted by neurosurgery on 09/09/2019 for seizures right hemiplegia and decreased responsiveness was found to have a left hemispheric intracranial hemorrhage intubated and admitted to the ICU underwent craniotomy for decompression on 09/09/2019 by Dr. Vertell Limber with a slow neurological recovery and prolonged hospitalization due to seizure and aspiration pneumonia continues to be awake but significantly dysphagia. A PEG tube was placed on 09/25/2019 disposition continues to be challenging as he is uninsured and needs long-term placement.  Assessment/Plan:   Left intracranial hemorrhage: Patient underwent decompressive craniotomy and hematoma evacuation on 09/09/2019 it was felt to be likely due to uncontrolled hypertension CT angio did not show vascular malformation. He is still aphasic nonverbal with right hemiplegia and significant cognitive impairment Dr. Vertell Limber did not recommend replacement of his skull bone due to lack of progression. Awaiting skilled nursing facility placement [facility] is on board appreciate assistance.  Seizure disorder: Continue Keppra.  Klebsiella UTI: He completed his course of cefazolin.  Essential hypertension: Blood pressure controlled continue Norvasc amlodipine and metoprolol.  Aspiration pneumonia: He completed his course of antibiotics.  Nutrition: He is status post PEG tube placement currently on tube feedings.  Chronic kidney disease stage III: Patient has been refusing his blood drawn.   DVT prophylaxis: lovenox Family Communication:none Disposition Plan/Barrier to D/C: Awaiting skilled nursing facility placement.  Code Status:     Code Status Orders  (From admission, onward)         Start     Ordered   09/09/19 0843  Full code  Continuous     09/09/19  0842        Code Status History    This patient has a current code status but no historical code status.   Advance Care Planning Activity        IV Access:    Peripheral IV   Procedures and diagnostic studies:   No results found.   Medical Consultants:    Trauma  PCCM  Neurosurgery  Neurology  Anti-Infectives:   none  Subjective:    Joshua [name] no complaints nonverbal.  Objective:    Vitals:   02/26/20 2002 02/26/20 2355 02/27/20 0333 02/27/20 0754  BP: (!) 136/94 103/79 125/90 (!) 130/103  Pulse: 80 93 83 93  Resp: 18 20 19 17   Temp: 98.1 F (36.7 C) 98.2 F (36.8 C) 98.3 F (36.8 C) 97.6 F (36.4 C)  TempSrc: Oral Axillary Axillary Oral  SpO2: 100% 100% 100% 100%  Weight:      Height:       SpO2: 100 % O2 Flow Rate (L/min): 0 L/min FiO2 (%): 40 %   Intake/Output Summary (Last 24 hours) at 02/27/2020 0853 Last data filed at 02/26/2020 1810 Gross per 24 hour  Intake 0 ml  Output 0 ml  Net 0 ml   Filed Weights   02/20/20 1948 02/21/20 0500 02/25/20 0406  Weight: 60 kg 60 kg 59 kg    Exam: General exam: In no acute distress. Respiratory system: Good air movement and clear to auscultation. Cardiovascular system: S1 & S2 heard, RRR. No JVD. Gastrointestinal system: Abdomen is nondistended, soft and nontender.  Extremities: No pedal edema. Skin: No rashes, lesions or ulcers   Data Reviewed:    Labs: Basic Metabolic Panel: No results for input(s): NA, K,  CL, CO2, GLUCOSE, BUN, CREATININE, CALCIUM, MG, PHOS in the last 168 hours. GFR CrCl cannot be calculated (Patient's most recent lab result is older than the maximum 21 days allowed.). Liver Function Tests: No results for input(s): AST, ALT, ALKPHOS, BILITOT, PROT, ALBUMIN in the last 168 hours. No results for input(s): LIPASE, AMYLASE in the last 168 hours. No results for input(s): AMMONIA in the last 168 hours. Coagulation profile No results for input(s): INR, PROTIME in  the last 168 hours. COVID-19 Labs  No results for input(s): DDIMER, FERRITIN, LDH, CRP in the last 72 hours.  Lab Results  Component Value Date   [place] NEGATIVE 09/09/2019    CBC: No results for input(s): WBC, NEUTROABS, HGB, HCT, MCV, PLT in the last 168 hours. Cardiac Enzymes: No results for input(s): CKTOTAL, CKMB, CKMBINDEX, TROPONINI in the last 168 hours. BNP (last 3 results) No results for input(s): PROBNP in the last 8760 hours. CBG: Recent Labs  Lab 02/26/20 0739 02/26/20 1209 02/26/20 1635 02/26/20 2001 02/27/20 0000  GLUCAP 77 112* 83 83 164*   D-Dimer: No results for input(s): DDIMER in the last 72 hours. Hgb A1c: No results for input(s): HGBA1C in the last 72 hours. Lipid Profile: No results for input(s): CHOL, HDL, LDLCALC, TRIG, CHOLHDL, LDLDIRECT in the last 72 hours. Thyroid function studies: No results for input(s): TSH, T4TOTAL, T3FREE, THYROIDAB in the last 72 hours.  Invalid input(s): FREET3 Anemia work up: No results for input(s): VITAMINB12, FOLATE, FERRITIN, TIBC, IRON, RETICCTPCT in the last 72 hours. Sepsis Labs: No results for input(s): PROCALCITON, WBC, LATICACIDVEN in the last 168 hours. Microbiology No results found for this or any previous visit (from the past 240 hour(s)).   Medications:   . amLODipine  10 mg Per Tube Daily  . chlorhexidine  15 mL Mouth Rinse BID  . cholecalciferol  1,000 Units Oral Daily  . dextrose  1 Tube Oral Once  . dextrose  25 g Intravenous Once  . enoxaparin (LOVENOX) injection  40 mg Subcutaneous Daily  . feeding supplement (JEVITY 1.2 CAL)  474 mL Per Tube 4x daily  . feeding supplement (PRO-STAT SUGAR FREE 64)  30 mL Per Tube QID  . folic acid  1 mg Per Tube Daily  . free water  200 mL Per Tube Q4H  . hydrALAZINE  25 mg Per Tube Q8H  . insulin aspart  0-15 Units Subcutaneous Q4H  . levETIRAcetam  500 mg Per Tube BID  . mouth rinse  15 mL Mouth Rinse q12n4p  . metoprolol tartrate  25 mg Per  Tube BID  . multivitamin  15 mL Per Tube Daily  . QUEtiapine  50 mg Per Tube QHS  . thiamine  100 mg Per Tube Daily   Continuous Infusions:    LOS: 171 days   Charlynne Cousins  [facility]  02/27/2020, 8:53 AM

## 2020-02-28 DIAGNOSIS — I611 Nontraumatic intracerebral hemorrhage in hemisphere, cortical: Secondary | ICD-10-CM | POA: Diagnosis not present

## 2020-02-28 LAB — GLUCOSE, CAPILLARY
Glucose-Capillary: 110 mg/dL — ABNORMAL HIGH (ref 70–99)
Glucose-Capillary: 120 mg/dL — ABNORMAL HIGH (ref 70–99)
Glucose-Capillary: 126 mg/dL — ABNORMAL HIGH (ref 70–99)
Glucose-Capillary: 85 mg/dL (ref 70–99)
Glucose-Capillary: 86 mg/dL (ref 70–99)
Glucose-Capillary: 99 mg/dL (ref 70–99)

## 2020-02-28 NOTE — Progress Notes (Signed)
Nutrition Follow-up    **RD working remotely**  DOCUMENTATION CODES:   Severe malnutrition in context of acute illness/injury  INTERVENTION:  ContinueJevity 1.2 formulavia PEG at goal volume of 474 ml (2 cartons/ARCs) given QID.   Provide 30 ml ProstatQID per tube.   Free water flushes of200 ml every 4 hours per tube. (MD to adjust as appropriate)  Tube feeding to provide 2675 kcal, 165grams of protein, and2740m free water.  Continue PO intake for comfort as appropriate/tolerated.   NUTRITION DIAGNOSIS:   Severe Malnutrition related to acute illness(intracerebral hemorrage) as evidenced by severe fat depletion, severe muscle depletion.  Ongoing.  GOAL:   Patient will meet greater than or equal to 90% of their needs  Met with TF.   MONITOR:   TF tolerance, Skin, Weight trends, Labs, I & O's  REASON FOR ASSESSMENT:   Ventilator, Consult Enteral/tube feeding initiation and management  ASSESSMENT:   39yo male admitted with large left intracranial hemorrhage; S/P craniotomy. PMH includes HTN, alcoholism, noncompliance.  9/12left frontal temporal craniectomy and evacuation hematoma.  9/23 Extubated.  10/1 PEG placed 11/23- Nutrition Focused Physical Exam performed, severe subcutaneous fat loss and severe muscle wasting identified.   Per MD, placement continues to be challenging as pt is uninsured and needs long-term placement.   Pt continues to refuse labs; last lab 01/17/20.   CBGs 81-126  Medications reviewed and include: Vitamin D3, Glutose, Jevity 1.2 cal 4746mQID, Folvite, 20057mree water Q4, 79m58mo-stat QID, SSI, MVI, Vitamin B1  Diet Order:   Diet Order            Diet regular Room service appropriate? Yes; Fluid consistency: Thin  Diet effective now              EDUCATION NEEDS:   Not appropriate for education at this time  Skin:  Skin Assessment: Skin Integrity Issues: Skin Integrity Issues:: Other (Comment) Other: skin  tear right thigh  Last BM:  02/27/20  Height:   Ht Readings from Last 1 Encounters:  09/09/19 '5\' 9"'  (1.753 m)    Weight:   Wt Readings from Last 1 Encounters:  02/25/20 59 kg    BMI:  Body mass index is 19.21 kg/m.  Estimated Nutritional Needs:   Kcal:  [id number]:  130-170 grams  Fluid:  >/= 2 L/day   Joshua [name], RD, LDN RD pager number and weekend/on-call pager number located in [place]

## 2020-02-28 NOTE — Progress Notes (Signed)
[facility] PROGRESS NOTE    Progress Note  Joshua [name]  [id number] DOB: 16-Aug-1981 DOA: 09/09/2019 PCP: Patient, [name]     Brief Narrative:   Joshua [name] is an 39 y.o. male admitted by neurosurgery on 09/09/2019 for seizures right hemiplegia and decreased responsiveness was found to have a left hemispheric intracranial hemorrhage intubated and admitted to the ICU underwent craniotomy for decompression on 09/09/2019 by Dr. Vertell Limber with a slow neurological recovery and prolonged hospitalization due to seizure and aspiration pneumonia continues to be awake but significantly dysphagia. A PEG tube was placed on 09/25/2019 disposition continues to be challenging as he is uninsured and needs long-term placement.  Assessment/Plan:   Left intracranial hemorrhage: Patient underwent decompressive craniotomy and hematoma evacuation on 09/09/2019 it was felt to be likely due to uncontrolled hypertension CT angio did not show vascular malformation. He is still aphasic nonverbal with right hemiplegia and significant cognitive impairment Dr. Vertell Limber did not recommend replacement of his skull bone due to lack of progression. Awaiting skilled nursing facility placement [facility] is on board appreciate assistance.  Seizure disorder: Continue Keppra.  Klebsiella UTI: He completed his course of cefazolin.  Essential hypertension: Blood pressure controlled continue Norvasc amlodipine and metoprolol.  Aspiration pneumonia: He completed his course of antibiotics.  Nutrition: He is status post PEG tube placement currently on tube feedings.  Chronic kidney disease stage III: Patient has been refusing his blood drawn.   DVT prophylaxis: lovenox Family Communication:none Disposition Plan/Barrier to D/C: Awaiting skilled nursing facility placement.  Code Status:     Code Status Orders  (From admission, onward)         Start     Ordered   09/09/19 0843  Full code  Continuous     09/09/19  0842        Code Status History    This patient has a current code status but no historical code status.   Advance Care Planning Activity        IV Access:    Peripheral IV   Procedures and diagnostic studies:   No results found.   Medical Consultants:    Trauma  PCCM  Neurosurgery  Neurology  Anti-Infectives:   none  Subjective:    Joshua [name] no complaints nonverbal.  Objective:    Vitals:   02/27/20 1945 02/27/20 2338 02/28/20 0423 02/28/20 0800  BP: 139/90 112/82 (!) 120/95 (!) 143/98  Pulse: 98 96 85 82  Resp: 20 19 19 18   Temp: 98 F (36.7 C) 98.4 F (36.9 C) 98.7 F (37.1 C) 98.4 F (36.9 C)  TempSrc: Oral Oral Oral Oral  SpO2: 99% 95% 99% 100%  Weight:      Height:       SpO2: 100 % O2 Flow Rate (L/min): 0 L/min FiO2 (%): 40 %  No intake or output data in the 24 hours ending 02/28/20 1019 Filed Weights   02/20/20 1948 02/21/20 0500 02/25/20 0406  Weight: 60 kg 60 kg 59 kg    Exam: General exam: In no acute distress. Respiratory system: Good air movement and clear to auscultation. Cardiovascular system: S1 & S2 heard, RRR. No JVD. Gastrointestinal system: Abdomen is nondistended, soft and nontender.  Extremities: No pedal edema. Skin: No rashes, lesions or ulcers   Data Reviewed:    Labs: Basic Metabolic Panel: No results for input(s): NA, K, CL, CO2, GLUCOSE, BUN, CREATININE, CALCIUM, MG, PHOS in the last 168 hours. GFR CrCl cannot be calculated (Patient's most recent  lab result is older than the maximum 21 days allowed.). Liver Function Tests: No results for input(s): AST, ALT, ALKPHOS, BILITOT, PROT, ALBUMIN in the last 168 hours. No results for input(s): LIPASE, AMYLASE in the last 168 hours. No results for input(s): AMMONIA in the last 168 hours. Coagulation profile No results for input(s): INR, PROTIME in the last 168 hours. COVID-19 Labs  No results for input(s): DDIMER, FERRITIN, LDH, CRP in the last 72  hours.  Lab Results  Component Value Date   [place] NEGATIVE 09/09/2019    CBC: No results for input(s): WBC, NEUTROABS, HGB, HCT, MCV, PLT in the last 168 hours. Cardiac Enzymes: No results for input(s): CKTOTAL, CKMB, CKMBINDEX, TROPONINI in the last 168 hours. BNP (last 3 results) No results for input(s): PROBNP in the last 8760 hours. CBG: Recent Labs  Lab 02/27/20 0759 02/27/20 1127 02/27/20 1539 02/27/20 2021 02/28/20 0016  GLUCAP 85 103* 81 114* 126*   D-Dimer: No results for input(s): DDIMER in the last 72 hours. Hgb A1c: No results for input(s): HGBA1C in the last 72 hours. Lipid Profile: No results for input(s): CHOL, HDL, LDLCALC, TRIG, CHOLHDL, LDLDIRECT in the last 72 hours. Thyroid function studies: No results for input(s): TSH, T4TOTAL, T3FREE, THYROIDAB in the last 72 hours.  Invalid input(s): FREET3 Anemia work up: No results for input(s): VITAMINB12, FOLATE, FERRITIN, TIBC, IRON, RETICCTPCT in the last 72 hours. Sepsis Labs: No results for input(s): PROCALCITON, WBC, LATICACIDVEN in the last 168 hours. Microbiology No results found for this or any previous visit (from the past 240 hour(s)).   Medications:   . amLODipine  10 mg Per Tube Daily  . chlorhexidine  15 mL Mouth Rinse BID  . cholecalciferol  1,000 Units Oral Daily  . dextrose  1 Tube Oral Once  . dextrose  25 g Intravenous Once  . enoxaparin (LOVENOX) injection  40 mg Subcutaneous Daily  . feeding supplement (JEVITY 1.2 CAL)  474 mL Per Tube 4x daily  . feeding supplement (PRO-STAT SUGAR FREE 64)  30 mL Per Tube QID  . folic acid  1 mg Per Tube Daily  . free water  200 mL Per Tube Q4H  . hydrALAZINE  25 mg Per Tube Q8H  . insulin aspart  0-15 Units Subcutaneous Q4H  . levETIRAcetam  500 mg Per Tube BID  . mouth rinse  15 mL Mouth Rinse q12n4p  . metoprolol tartrate  25 mg Per Tube BID  . multivitamin  15 mL Per Tube Daily  . QUEtiapine  50 mg Per Tube QHS  . thiamine  100 mg  Per Tube Daily   Continuous Infusions:    LOS: 172 days   Charlynne Cousins  [facility]  02/28/2020, 10:19 AM

## 2020-02-28 NOTE — Progress Notes (Signed)
I concur with the assessment implemented and entered by [employer], SN.

## 2020-02-29 DIAGNOSIS — I611 Nontraumatic intracerebral hemorrhage in hemisphere, cortical: Secondary | ICD-10-CM | POA: Diagnosis not present

## 2020-02-29 LAB — GLUCOSE, CAPILLARY
Glucose-Capillary: 107 mg/dL — ABNORMAL HIGH (ref 70–99)
Glucose-Capillary: 136 mg/dL — ABNORMAL HIGH (ref 70–99)
Glucose-Capillary: 158 mg/dL — ABNORMAL HIGH (ref 70–99)
Glucose-Capillary: 92 mg/dL (ref 70–99)
Glucose-Capillary: 94 mg/dL (ref 70–99)
Glucose-Capillary: 94 mg/dL (ref 70–99)

## 2020-02-29 NOTE — Progress Notes (Signed)
[facility] PROGRESS NOTE    Progress Note  Joshua [name]  [id number] DOB: [birth year] DOA: 09/09/2019 PCP: Patient, [name]     Brief Narrative:   Joshua [name] is an 39 y.o. male admitted by neurosurgery on 09/09/2019 for seizures right hemiplegia and decreased responsiveness was found to have a left hemispheric intracranial hemorrhage intubated and admitted to the ICU underwent craniotomy for decompression on 09/09/2019 by Dr. Vertell Limber with a slow neurological recovery and prolonged hospitalization due to seizure and aspiration pneumonia continues to be awake but significantly dysphagia. A PEG tube was placed on 09/25/2019 disposition continues to be challenging as he is uninsured and needs long-term placement.  Assessment/Plan:   Left intracranial hemorrhage: Patient underwent decompressive craniotomy and hematoma evacuation on 09/09/2019 it was felt to be likely due to uncontrolled hypertension CT angio did not show vascular malformation. He is still aphasic nonverbal with right hemiplegia and significant cognitive impairment Dr. Vertell Limber did not recommend replacement of his skull bone due to lack of progression. Awaiting skilled nursing facility placement [facility] is on board appreciate assistance.  Seizure disorder: Continue Keppra.  Klebsiella UTI: He completed his course of cefazolin.  Essential hypertension: Blood pressure controlled continue Norvasc amlodipine and metoprolol.  Aspiration pneumonia: He completed his course of antibiotics.  Nutrition: He is status post PEG tube placement currently on tube feedings.  Chronic kidney disease stage III: Patient has been refusing his blood drawn.   DVT prophylaxis: lovenox Family Communication:none Disposition Plan/Barrier to D/C: Awaiting skilled nursing facility placement.  Code Status:     Code Status Orders  (From admission, onward)         Start     Ordered   09/09/19 0843  Full code  Continuous     09/09/19  0842        Code Status History    This patient has a current code status but no historical code status.   Advance Care Planning Activity        IV Access:    Peripheral IV   Procedures and diagnostic studies:   No results found.   Medical Consultants:    Trauma  PCCM  Neurosurgery  Neurology  Anti-Infectives:   none  Subjective:    [name] no complaints nonverbal.  Objective:    Vitals:   02/28/20 1554 02/28/20 2043 02/29/20 0000 02/29/20 0333  BP: (!) 120/97 (!) 138/94 134/70 (!) 127/92  Pulse: 97 93 92 79  Resp: 20 18 20 18   Temp: 98.6 F (37 C) 98.9 F (37.2 C) 98.5 F (36.9 C) 98.6 F (37 C)  TempSrc: Oral Oral Oral   SpO2: 98% 95% 100% 100%  Weight:    59 kg  Height:       SpO2: 100 % O2 Flow Rate (L/min): 0 L/min FiO2 (%): 40 %   Intake/Output Summary (Last 24 hours) at 02/29/2020 0857 Last data filed at 02/28/2020 1328 Gross per 24 hour  Intake 0 ml  Output --  Net 0 ml   Filed Weights   02/21/20 0500 02/25/20 0406 02/29/20 0333  Weight: 60 kg 59 kg 59 kg    Exam: General exam: In no acute distress. Respiratory system: Good air movement and clear to auscultation. Cardiovascular system: S1 & S2 heard, RRR. No JVD. Gastrointestinal system: Abdomen is nondistended, soft and nontender.  Extremities: No pedal edema. Skin: No rashes, lesions or ulcers   Data Reviewed:    Labs: Basic Metabolic Panel: No results for input(s): NA,  K, CL, CO2, GLUCOSE, BUN, CREATININE, CALCIUM, MG, PHOS in the last 168 hours. GFR CrCl cannot be calculated (Patient's most recent lab result is older than the maximum 21 days allowed.). Liver Function Tests: No results for input(s): AST, ALT, ALKPHOS, BILITOT, PROT, ALBUMIN in the last 168 hours. No results for input(s): LIPASE, AMYLASE in the last 168 hours. No results for input(s): AMMONIA in the last 168 hours. Coagulation profile No results for input(s): INR, PROTIME in the last 168  hours. COVID-19 Labs  No results for input(s): DDIMER, FERRITIN, LDH, CRP in the last 72 hours.  Lab Results  Component Value Date   [place] NEGATIVE 09/09/2019    CBC: No results for input(s): WBC, NEUTROABS, HGB, HCT, MCV, PLT in the last 168 hours. Cardiac Enzymes: No results for input(s): CKTOTAL, CKMB, CKMBINDEX, TROPONINI in the last 168 hours. BNP (last 3 results) No results for input(s): PROBNP in the last 8760 hours. CBG: Recent Labs  Lab 02/28/20 0809 02/28/20 1128 02/28/20 1556 02/28/20 2007 02/29/20 0001  GLUCAP 86 110* 85 120* 158*   D-Dimer: No results for input(s): DDIMER in the last 72 hours. Hgb A1c: No results for input(s): HGBA1C in the last 72 hours. Lipid Profile: No results for input(s): CHOL, HDL, LDLCALC, TRIG, CHOLHDL, LDLDIRECT in the last 72 hours. Thyroid function studies: No results for input(s): TSH, T4TOTAL, T3FREE, THYROIDAB in the last 72 hours.  Invalid input(s): FREET3 Anemia work up: No results for input(s): VITAMINB12, FOLATE, FERRITIN, TIBC, IRON, RETICCTPCT in the last 72 hours. Sepsis Labs: No results for input(s): PROCALCITON, WBC, LATICACIDVEN in the last 168 hours. Microbiology No results found for this or any previous visit (from the past 240 hour(s)).   Medications:   . amLODipine  10 mg Per Tube Daily  . chlorhexidine  15 mL Mouth Rinse BID  . cholecalciferol  1,000 Units Oral Daily  . dextrose  1 Tube Oral Once  . dextrose  25 g Intravenous Once  . enoxaparin (LOVENOX) injection  40 mg Subcutaneous Daily  . feeding supplement (JEVITY 1.2 CAL)  474 mL Per Tube 4x daily  . feeding supplement (PRO-STAT SUGAR FREE 64)  30 mL Per Tube QID  . folic acid  1 mg Per Tube Daily  . free water  200 mL Per Tube Q4H  . hydrALAZINE  25 mg Per Tube Q8H  . insulin aspart  0-15 Units Subcutaneous Q4H  . levETIRAcetam  500 mg Per Tube BID  . mouth rinse  15 mL Mouth Rinse q12n4p  . metoprolol tartrate  25 mg Per Tube BID  .  multivitamin  15 mL Per Tube Daily  . QUEtiapine  50 mg Per Tube QHS  . thiamine  100 mg Per Tube Daily   Continuous Infusions:    LOS: 173 days   Charlynne Cousins  [facility]  02/29/2020, 8:57 AM

## 2020-03-01 ENCOUNTER — Inpatient Hospital Stay (HOSPITAL_COMMUNITY): Payer: Medicaid Other

## 2020-03-01 DIAGNOSIS — I611 Nontraumatic intracerebral hemorrhage in hemisphere, cortical: Secondary | ICD-10-CM | POA: Diagnosis not present

## 2020-03-01 LAB — GLUCOSE, CAPILLARY
Glucose-Capillary: 145 mg/dL — ABNORMAL HIGH (ref 70–99)
Glucose-Capillary: 108 mg/dL — ABNORMAL HIGH (ref 70–99)
Glucose-Capillary: 88 mg/dL (ref 70–99)
Glucose-Capillary: 69 mg/dL — ABNORMAL LOW (ref 70–99)
Glucose-Capillary: 88 mg/dL (ref 70–99)
Glucose-Capillary: 118 mg/dL — ABNORMAL HIGH (ref 70–99)
Glucose-Capillary: 103 mg/dL — ABNORMAL HIGH (ref 70–99)

## 2020-03-01 MED ORDER — POLYETHYLENE GLYCOL 3350 17 G PO PACK
17.0000 g | PACK | Freq: Two times a day (BID) | ORAL | Status: DC
  Administered 2020-03-01 – 2020-03-13 (×19): 17 g via ORAL
  Filled 2020-03-01 (×22): qty 1

## 2020-03-01 NOTE — TOC Progression Note (Signed)
Transition of Care [facility]) - Progression Note    Patient Details  Name: Joshua [name] MRN: [id number] Date of Birth: 11-21-1981  Transition of Care [facility]) CM/SW Contact  Pollie Friar, RN Phone Number: 03/01/2020, 12:15 PM  Clinical Narrative:    Disability is still pending and no bed offers. TOC following.   Expected Discharge Plan: Skilled Nursing Facility Barriers to Discharge: SNF Pending payor source - LOG, SNF Pending bed offer, Inadequate or no insurance  Expected Discharge Plan and Services Expected Discharge Plan: [place] In-house Referral: Clinical Social Work Discharge Planning Services: NA Post Acute Care Choice: [place] Living arrangements for the past 2 months: Apartment                 DME Arranged: N/A DME Agency: NA       HH Arranged: NA HH Agency: NA         Social Determinants of Health (SDOH) Interventions    Readmission Risk Interventions No flowsheet data found.

## 2020-03-01 NOTE — Progress Notes (Signed)
Joshua [name] refused lovenox, mouth care and lip moisturizer, as welll as, hand braces today, but otherwise did well.

## 2020-03-01 NOTE — Progress Notes (Signed)
[facility] PROGRESS NOTE    Progress Note  Joshua [name]  YIA:[id number] DOB: 06-11-1981 DOA: 09/09/2019 PCP: Patient, [name]     Brief Narrative:   Joshua [name] is an 39 y.o. male admitted by neurosurgery on 09/09/2019 for seizures right hemiplegia and decreased responsiveness was found to have a left hemispheric intracranial hemorrhage intubated and admitted to the ICU underwent craniotomy for decompression on 09/09/2019 by Dr. Vertell Limber with a slow neurological recovery and prolonged hospitalization due to seizure and aspiration pneumonia continues to be awake but significantly dysphagia. A PEG tube was placed on 09/25/2019 disposition continues to be challenging as he is uninsured and needs long-term placement.  Assessment/Plan:   Left intracranial hemorrhage: Patient underwent decompressive craniotomy and hematoma evacuation on 09/09/2019 it was felt to be likely due to uncontrolled hypertension CT angio did not show vascular malformation. He is still aphasic nonverbal with right hemiplegia and significant cognitive impairment Dr. Vertell Limber did not recommend replacement of his skull bone due to lack of progression. Awaiting skilled nursing facility placement [facility] is on board appreciate assistance.  Seizure disorder: Continue Keppra.  Klebsiella UTI: He completed his course of cefazolin.  Essential hypertension: Blood pressure controlled continue Norvasc amlodipine and metoprolol.  Aspiration pneumonia: He completed his course of antibiotics.  Nutrition: He is status post PEG tube placement currently on tube feedings.  Chronic kidney disease stage III: Patient has been refusing his blood drawn.   DVT prophylaxis: lovenox Family Communication:none Disposition Plan/Barrier to D/C: Awaiting skilled nursing facility placement.  Code Status:     Code Status Orders  (From admission, onward)         Start     Ordered   09/09/19 0843  Full code  Continuous     09/09/19  0842        Code Status History    This patient has a current code status but no historical code status.   Advance Care Planning Activity        IV Access:    Peripheral IV   Procedures and diagnostic studies:   No results found.   Medical Consultants:    Trauma  PCCM  Neurosurgery  Neurology  Anti-Infectives:   none  Subjective:    Devona [name] no complaints nonverbal.  Objective:    Vitals:   02/29/20 2054 02/29/20 2325 03/01/20 0316 03/01/20 0733  BP: (!) 123/101 122/70 (!) 121/93 118/88  Pulse: 82 86 80 74  Resp: 18 20 16 14   Temp: 98.2 F (36.8 C) 98.5 F (36.9 C) 98.4 F (36.9 C) 97.8 F (36.6 C)  TempSrc: Oral Oral  Oral  SpO2: 100% 99% 100% 100%  Weight:      Height:       SpO2: 100 % O2 Flow Rate (L/min): 0 L/min FiO2 (%): 40 %   Intake/Output Summary (Last 24 hours) at 03/01/2020 0833 Last data filed at 02/29/2020 1343 Gross per 24 hour  Intake 440 ml  Output --  Net 440 ml   Filed Weights   02/21/20 0500 02/25/20 0406 02/29/20 0333  Weight: 60 kg 59 kg 59 kg    Exam: General exam: In no acute distress. Respiratory system: Good air movement and clear to auscultation. Cardiovascular system: S1 & S2 heard, RRR. No JVD. Gastrointestinal system: Abdomen is nondistended, soft and nontender.  Extremities: No pedal edema. Skin: No rashes, lesions or ulcers   Data Reviewed:    Labs: Basic Metabolic Panel: No results for input(s): NA, K, CL,  CO2, GLUCOSE, BUN, CREATININE, CALCIUM, MG, PHOS in the last 168 hours. GFR CrCl cannot be calculated (Patient's most recent lab result is older than the maximum 21 days allowed.). Liver Function Tests: No results for input(s): AST, ALT, ALKPHOS, BILITOT, PROT, ALBUMIN in the last 168 hours. No results for input(s): LIPASE, AMYLASE in the last 168 hours. No results for input(s): AMMONIA in the last 168 hours. Coagulation profile No results for input(s): INR, PROTIME in the last 168  hours. COVID-19 Labs  No results for input(s): DDIMER, FERRITIN, LDH, CRP in the last 72 hours.  Lab Results  Component Value Date   [place] NEGATIVE 09/09/2019    CBC: No results for input(s): WBC, NEUTROABS, HGB, HCT, MCV, PLT in the last 168 hours. Cardiac Enzymes: No results for input(s): CKTOTAL, CKMB, CKMBINDEX, TROPONINI in the last 168 hours. BNP (last 3 results) No results for input(s): PROBNP in the last 8760 hours. CBG: Recent Labs  Lab 02/29/20 0754 02/29/20 1150 02/29/20 1639 02/29/20 2050 02/29/20 2355  GLUCAP 92 94 136* 94 145*   D-Dimer: No results for input(s): DDIMER in the last 72 hours. Hgb A1c: No results for input(s): HGBA1C in the last 72 hours. Lipid Profile: No results for input(s): CHOL, HDL, LDLCALC, TRIG, CHOLHDL, LDLDIRECT in the last 72 hours. Thyroid function studies: No results for input(s): TSH, T4TOTAL, T3FREE, THYROIDAB in the last 72 hours.  Invalid input(s): FREET3 Anemia work up: No results for input(s): VITAMINB12, FOLATE, FERRITIN, TIBC, IRON, RETICCTPCT in the last 72 hours. Sepsis Labs: No results for input(s): PROCALCITON, WBC, LATICACIDVEN in the last 168 hours. Microbiology No results found for this or any previous visit (from the past 240 hour(s)).   Medications:   . amLODipine  10 mg Per Tube Daily  . chlorhexidine  15 mL Mouth Rinse BID  . cholecalciferol  1,000 Units Oral Daily  . dextrose  1 Tube Oral Once  . dextrose  25 g Intravenous Once  . enoxaparin (LOVENOX) injection  40 mg Subcutaneous Daily  . feeding supplement (JEVITY 1.2 CAL)  474 mL Per Tube 4x daily  . feeding supplement (PRO-STAT SUGAR FREE 64)  30 mL Per Tube QID  . folic acid  1 mg Per Tube Daily  . free water  200 mL Per Tube Q4H  . hydrALAZINE  25 mg Per Tube Q8H  . insulin aspart  0-15 Units Subcutaneous Q4H  . levETIRAcetam  500 mg Per Tube BID  . mouth rinse  15 mL Mouth Rinse q12n4p  . metoprolol tartrate  25 mg Per Tube BID  .  multivitamin  15 mL Per Tube Daily  . QUEtiapine  50 mg Per Tube QHS  . thiamine  100 mg Per Tube Daily   Continuous Infusions:    LOS: 174 days   Charlynne Cousins  [facility]  03/01/2020, 8:33 AM

## 2020-03-02 DIAGNOSIS — N184 Chronic kidney disease, stage 4 (severe): Secondary | ICD-10-CM | POA: Diagnosis not present

## 2020-03-02 DIAGNOSIS — I611 Nontraumatic intracerebral hemorrhage in hemisphere, cortical: Secondary | ICD-10-CM | POA: Diagnosis not present

## 2020-03-02 DIAGNOSIS — J9601 Acute respiratory failure with hypoxia: Secondary | ICD-10-CM | POA: Diagnosis not present

## 2020-03-02 DIAGNOSIS — G936 Cerebral edema: Secondary | ICD-10-CM | POA: Diagnosis not present

## 2020-03-02 LAB — GLUCOSE, CAPILLARY: Glucose-Capillary: 147 mg/dL — ABNORMAL HIGH (ref 70–99)

## 2020-03-02 NOTE — Plan of Care (Signed)
  Problem: Nutrition: Goal: Adequate nutrition will be maintained Outcome: Progressing   Problem: Safety: Goal: Ability to remain free from injury will improve Outcome: Progressing   

## 2020-03-02 NOTE — Progress Notes (Signed)
PROGRESS NOTE    Joshua [name]  YKD:[id number] DOB: [birth year] DOA: 09/09/2019 PCP: Patient, [name]   Brief Narrative:  Joshua [name] is an 39 y.o. male admitted by neurosurgery on 09/09/2019 for seizures right hemiplegia and decreased responsiveness was found to have a left hemispheric intracranial hemorrhage intubated and admitted to the ICU underwent craniotomy for decompression on 09/09/2019 by Dr. Vertell Limber with a slow neurological recovery and prolonged hospitalization due to seizure and aspiration pneumonia continues to be awake but significantly dysphagia. A PEG tube was placed on 09/25/2019 disposition continues to be challenging as he is uninsured and needs long-term placement. Assessment & Plan   Left intracranial hemorrhage -Patient underwent decompressive craniotomy and hematoma evacuation on 09/09/2019 it was felt to be likely due to uncontrolled hypertension CT angio did not show vascular malformation. -Remains aphasic and nonverbal with right hemiplegia and significant cognitive impairment Dr. Vertell Limber did not recommend replacement of his skull bone due to lack of progression. -Awaiting skilled nursing facility placement [facility] is on board appreciate assistance.  Seizure disorder -Continue Keppra.  Klebsiella UTI -Completed course of antibiotics with cefazolin  Essential hypertension -He appears to be controlled, continue amlodipine, metoprolol, hydralazine  Aspiration pneumonia -Course of antibiotics  Nutrition -He is status post PEG tube placement currently on tube feedings.  Chronic kidney disease stage III -Patient has been refusing his blood drawn  History of alcoholism -Patient completed clonidine taper for presumed alcohol withdrawal earlier in hospital stay  Mild anemia of chronic disease -Has not allowed for laboratory draws since January  DVT Prophylaxis  lovenox  Code Status: Full  Family Communication: None at bedside  Disposition Plan: Admitted.  Pending SNF- difficult to place.   Consultants Trauma surgery PCCM Neurosurgery Neurology Gastroenterology  Procedures  Intubation/extubation Decompressive craniotomy and hematoma evacuation, implantation of physical flap in the right abdomen PEG tube placement EGD Echocardiogram Renal ultrasound   Antibiotics   Anti-infectives (From admission, onward)   Start     Dose/Rate Route Frequency Ordered Stop   09/27/19 1130  cephALEXin (KEFLEX) 250 MG/5ML suspension 500 mg     500 mg Per Tube Every 12 hours 09/27/19 1052 09/29/19 2250   09/25/19 1445  ceFAZolin (ANCEF) IVPB 2g/100 mL premix  Status:  Discontinued     2 g 200 mL/hr over 30 Minutes Intravenous Every 12 hours 09/25/19 1417 09/27/19 1052   09/14/19 1000  Ampicillin-Sulbactam (UNASYN) 3 g in sodium chloride 0.9 % 100 mL IVPB     3 g 200 mL/hr over 30 Minutes Intravenous Every 8 hours 09/14/19 0922 09/17/19 1816   09/13/19 1100  vancomycin (VANCOCIN) IVPB 750 mg/150 ml premix  Status:  Discontinued     750 mg 150 mL/hr over 60 Minutes Intravenous Every 24 hours 09/13/19 0728 09/13/19 0838   09/12/19 1100  vancomycin (VANCOCIN) IVPB 1000 mg/200 mL premix  Status:  Discontinued     1,000 mg 200 mL/hr over 60 Minutes Intravenous Every 24 hours 09/11/19 1034 09/13/19 0728   09/11/19 1130  piperacillin-tazobactam (ZOSYN) IVPB 3.375 g  Status:  Discontinued     3.375 g 12.5 mL/hr over 240 Minutes Intravenous Every 8 hours 09/11/19 1054 09/14/19 0845   09/11/19 1100  vancomycin (VANCOCIN) 1,500 mg in sodium chloride 0.9 % 500 mL IVPB     1,500 mg 250 mL/hr over 120 Minutes Intravenous  Once 09/11/19 1034 09/11/19 1900   09/11/19 1015  piperacillin-tazobactam (ZOSYN) IVPB 4.5 g  Status:  Discontinued     4.5 g  200 mL/hr over 30 Minutes Intravenous Every 8 hours 09/11/19 1010 09/11/19 1052   09/09/19 0845  ceFAZolin (ANCEF) IVPB 2g/100 mL premix     2 g 200 mL/hr over 30 Minutes Intravenous Every 8 hours 09/09/19 0842  09/09/19 1658      Subjective:   [name] seen and examined today.  Patient nonverbal.    Objective:   Vitals:   03/02/20 0353 03/02/20 0735 03/02/20 1147 03/02/20 1552  BP: (!) 142/102 121/88 116/84 133/90  Pulse: 81 86 84 95  Resp: 12 16 16 18   Temp: 98 F (36.7 C) 98.1 F (36.7 C) 98.1 F (36.7 C) 98.2 F (36.8 C)  TempSrc: Oral Oral Oral Oral  SpO2: 100% 100% 100% 99%  Weight:      Height:        Intake/Output Summary (Last 24 hours) at 03/02/2020 1640 Last data filed at 03/02/2020 0920 Gross per 24 hour  Intake 50 ml  Output --  Net 50 ml   Filed Weights   02/21/20 0500 02/25/20 0406 02/29/20 0333  Weight: 60 kg 59 kg 59 kg    Exam (patient 100 exam)  General: Well developed, well nourished, NAD, appears stated age  Cardiovascular: S1 S2 auscultated, RRR  Respiratory: Clear to auscultation bilaterally   Abdomen: Soft, nontender, nondistended, + bowel sounds  Extremities: warm dry without cyanosis clubbing or edema  Neuro: Cannot fully assess, patient is awake   Data Reviewed: I have personally reviewed following labs and imaging studies  CBC: No results for input(s): WBC, NEUTROABS, HGB, HCT, MCV, PLT in the last 168 hours. Basic Metabolic Panel: No results for input(s): NA, K, CL, CO2, GLUCOSE, BUN, CREATININE, CALCIUM, MG, PHOS in the last 168 hours. GFR: CrCl cannot be calculated (Patient's most recent lab result is older than the maximum 21 days allowed.). Liver Function Tests: No results for input(s): AST, ALT, ALKPHOS, BILITOT, PROT, ALBUMIN in the last 168 hours. No results for input(s): LIPASE, AMYLASE in the last 168 hours. No results for input(s): AMMONIA in the last 168 hours. Coagulation Profile: No results for input(s): INR, PROTIME in the last 168 hours. Cardiac Enzymes: No results for input(s): CKTOTAL, CKMB, CKMBINDEX, TROPONINI in the last 168 hours. BNP (last 3 results) No results for input(s): PROBNP in the last 8760  hours. HbA1C: No results for input(s): HGBA1C in the last 72 hours. CBG: Recent Labs  Lab 03/01/20 1129 03/01/20 1401 03/01/20 1531 03/01/20 2140 03/01/20 2331  GLUCAP 69* 88 118* 103* 147*   Lipid Profile: No results for input(s): CHOL, HDL, LDLCALC, TRIG, CHOLHDL, LDLDIRECT in the last 72 hours. Thyroid Function Tests: No results for input(s): TSH, T4TOTAL, FREET4, T3FREE, THYROIDAB in the last 72 hours. Anemia Panel: No results for input(s): VITAMINB12, FOLATE, FERRITIN, TIBC, IRON, RETICCTPCT in the last 72 hours. Urine analysis:    Component Value Date/Time   COLORURINE YELLOW (A) 09/23/2019 1108   APPEARANCEUR CLEAR (A) 09/23/2019 1108   LABSPEC 1.015 09/23/2019 1108   PHURINE 7.5 09/23/2019 1108   GLUCOSEU NEGATIVE 09/23/2019 1108   HGBUR NEGATIVE 09/23/2019 1108   [place] 09/23/2019 1108   [place] 09/23/2019 1108   PROTEINUR NEGATIVE 09/23/2019 1108   UROBILINOGEN 1.0 04/05/2009 0021   NITRITE NEGATIVE 09/23/2019 1108   LEUKOCYTESUR NEGATIVE 09/23/2019 1108   Sepsis Labs: @LABRCNTIP (procalcitonin:4,lacticidven:4)  )No results found for this or any previous visit (from the past 240 hour(s)).    Radiology Studies: DG Abd 1 View  Result Date: 03/01/2020 CLINICAL  DATA:  Abdominal pain. EXAM: ABDOMEN - 1 VIEW COMPARISON:  Abdominal radiograph and CT 01/08/2020 FINDINGS: A PEG tube remains in place. Density projecting over the right mid abdomen is unchanged and reflects craniectomy flaps which have been implanted in the abdominal wall. Gas is present in multiple nondilated loops of small bowel. There is a small to moderate amount of stool in the colon. No acute osseous abnormality is identified. IMPRESSION: Nonobstructed bowel gas pattern. Electronically Signed   By: Logan Bores M.D.   On: 03/01/2020 11:57     Scheduled Meds: . amLODipine  10 mg Per Tube Daily  . chlorhexidine  15 mL Mouth Rinse BID  . cholecalciferol  1,000 Units Oral  Daily  . dextrose  1 Tube Oral Once  . dextrose  25 g Intravenous Once  . enoxaparin (LOVENOX) injection  40 mg Subcutaneous Daily  . feeding supplement (JEVITY 1.2 CAL)  474 mL Per Tube 4x daily  . feeding supplement (PRO-STAT SUGAR FREE 64)  30 mL Per Tube QID  . folic acid  1 mg Per Tube Daily  . free water  200 mL Per Tube Q4H  . hydrALAZINE  25 mg Per Tube Q8H  . insulin aspart  0-15 Units Subcutaneous Q4H  . levETIRAcetam  500 mg Per Tube BID  . mouth rinse  15 mL Mouth Rinse q12n4p  . metoprolol tartrate  25 mg Per Tube BID  . multivitamin  15 mL Per Tube Daily  . polyethylene glycol  17 g Oral BID  . QUEtiapine  50 mg Per Tube QHS  . thiamine  100 mg Per Tube Daily   Continuous Infusions:   LOS: 175 days   Time Spent in minutes   45 minutes (reviewing long hospital stay)  Cristal Ford D.O. on 03/02/2020 at 4:40 PM  Between 7am to 7pm - Please see pager noted on [email or web address]  After 7pm go to [email or web address]  And look for the night coverage person covering for me after hours  [facility] Office  [phone]

## 2020-03-03 DIAGNOSIS — N179 Acute kidney failure, unspecified: Secondary | ICD-10-CM | POA: Diagnosis not present

## 2020-03-03 DIAGNOSIS — I619 Nontraumatic intracerebral hemorrhage, unspecified: Secondary | ICD-10-CM | POA: Diagnosis not present

## 2020-03-03 DIAGNOSIS — I611 Nontraumatic intracerebral hemorrhage in hemisphere, cortical: Secondary | ICD-10-CM | POA: Diagnosis not present

## 2020-03-03 DIAGNOSIS — J9601 Acute respiratory failure with hypoxia: Secondary | ICD-10-CM | POA: Diagnosis not present

## 2020-03-03 NOTE — Progress Notes (Signed)
PROGRESS NOTE    Joshua [name]  PPJ:[id number] DOB: 17-Oct-1981 DOA: 09/09/2019 PCP: Patient, [name]   Brief Narrative:  Joshua [name] is an 39 y.o. male admitted by neurosurgery on 09/09/2019 for seizures right hemiplegia and decreased responsiveness was found to have a left hemispheric intracranial hemorrhage intubated and admitted to the ICU underwent craniotomy for decompression on 09/09/2019 by Dr. Vertell Limber with a slow neurological recovery and prolonged hospitalization due to seizure and aspiration pneumonia continues to be awake but significantly dysphagia. A PEG tube was placed on 09/25/2019 disposition continues to be challenging as he is uninsured and needs long-term placement. Assessment & Plan   Left intracranial hemorrhage -Patient underwent decompressive craniotomy and hematoma evacuation on 09/09/2019 it was felt to be likely due to uncontrolled hypertension CT angio did not show vascular malformation. -Remains aphasic and nonverbal with right hemiplegia and significant cognitive impairment Dr. Vertell Limber did not recommend replacement of his skull bone due to lack of progression. -Awaiting skilled nursing facility placement [facility] is on board appreciate assistance.  Seizure disorder -Continue Keppra.  Klebsiella UTI -Completed course of antibiotics with cefazolin  Essential hypertension -He appears to be controlled, continue amlodipine, metoprolol, hydralazine  Aspiration pneumonia -Course of antibiotics  Nutrition -He is status post PEG tube placement currently on tube feedings.  Chronic kidney disease stage III -Patient has been refusing his blood drawn  History of alcoholism -Patient completed clonidine taper for presumed alcohol withdrawal earlier in hospital stay  Mild anemia of chronic disease -Has not allowed for laboratory draws since January  DVT Prophylaxis  lovenox  Code Status: Full  Family Communication: None at bedside  Disposition Plan: Admitted.  Pending SNF- difficult to place.   Consultants Trauma surgery PCCM Neurosurgery Neurology Gastroenterology  Procedures  Intubation/extubation Decompressive craniotomy and hematoma evacuation, implantation of physical flap in the right abdomen PEG tube placement EGD Echocardiogram Renal ultrasound   Antibiotics   Anti-infectives (From admission, onward)   Start     Dose/Rate Route Frequency Ordered Stop   09/27/19 1130  cephALEXin (KEFLEX) 250 MG/5ML suspension 500 mg     500 mg Per Tube Every 12 hours 09/27/19 1052 09/29/19 2250   09/25/19 1445  ceFAZolin (ANCEF) IVPB 2g/100 mL premix  Status:  Discontinued     2 g 200 mL/hr over 30 Minutes Intravenous Every 12 hours 09/25/19 1417 09/27/19 1052   09/14/19 1000  Ampicillin-Sulbactam (UNASYN) 3 g in sodium chloride 0.9 % 100 mL IVPB     3 g 200 mL/hr over 30 Minutes Intravenous Every 8 hours 09/14/19 0922 09/17/19 1816   09/13/19 1100  vancomycin (VANCOCIN) IVPB 750 mg/150 ml premix  Status:  Discontinued     750 mg 150 mL/hr over 60 Minutes Intravenous Every 24 hours 09/13/19 0728 09/13/19 0838   09/12/19 1100  vancomycin (VANCOCIN) IVPB 1000 mg/200 mL premix  Status:  Discontinued     1,000 mg 200 mL/hr over 60 Minutes Intravenous Every 24 hours 09/11/19 1034 09/13/19 0728   09/11/19 1130  piperacillin-tazobactam (ZOSYN) IVPB 3.375 g  Status:  Discontinued     3.375 g 12.5 mL/hr over 240 Minutes Intravenous Every 8 hours 09/11/19 1054 09/14/19 0845   09/11/19 1100  vancomycin (VANCOCIN) 1,500 mg in sodium chloride 0.9 % 500 mL IVPB     1,500 mg 250 mL/hr over 120 Minutes Intravenous  Once 09/11/19 1034 09/11/19 1900   09/11/19 1015  piperacillin-tazobactam (ZOSYN) IVPB 4.5 g  Status:  Discontinued     4.5 g  200 mL/hr over 30 Minutes Intravenous Every 8 hours 09/11/19 1010 09/11/19 1052   09/09/19 0845  ceFAZolin (ANCEF) IVPB 2g/100 mL premix     2 g 200 mL/hr over 30 Minutes Intravenous Every 8 hours 09/09/19 0842  09/09/19 1658      Subjective:   Joshua [name] seen and examined today.  Patient nonverbal.  Does shake his head to some questions.   Objective:   Vitals:   03/02/20 1956 03/02/20 2327 03/03/20 0351 03/03/20 1005  BP: (!) 140/96 114/81 103/76 117/83  Pulse: 92 89 74 77  Resp: 18 18 18 18   Temp: 97.9 F (36.6 C) 98.6 F (37 C) (!) 97.5 F (36.4 C) 97.9 F (36.6 C)  TempSrc: Oral Oral Oral Oral  SpO2: 100% 98% 99% 100%  Weight:      Height:        Intake/Output Summary (Last 24 hours) at 03/03/2020 1306 Last data filed at 03/02/2020 2100 Gross per 24 hour  Intake 1274 ml  Output --  Net 1274 ml   Filed Weights   02/21/20 0500 02/25/20 0406 02/29/20 0333  Weight: 60 kg 59 kg 59 kg    Exam   General: Well developed, thin, NAD  Cardiovascular: S1 S2 auscultated, RRR  Respiratory: Clear to auscultation bilaterally   Abdomen: Soft, nontender, nondistended, + bowel sounds  Extremities: warm dry without cyanosis clubbing or edema  Neuro: Cannot fully assess, patient is awake. Shakes his head no to some questions such as obtaining labs.    Data Reviewed: I have personally reviewed following labs and imaging studies  CBC: No results for input(s): WBC, NEUTROABS, HGB, HCT, MCV, PLT in the last 168 hours. Basic Metabolic Panel: No results for input(s): NA, K, CL, CO2, GLUCOSE, BUN, CREATININE, CALCIUM, MG, PHOS in the last 168 hours. GFR: CrCl cannot be calculated (Patient's most recent lab result is older than the maximum 21 days allowed.). Liver Function Tests: No results for input(s): AST, ALT, ALKPHOS, BILITOT, PROT, ALBUMIN in the last 168 hours. No results for input(s): LIPASE, AMYLASE in the last 168 hours. No results for input(s): AMMONIA in the last 168 hours. Coagulation Profile: No results for input(s): INR, PROTIME in the last 168 hours. Cardiac Enzymes: No results for input(s): CKTOTAL, CKMB, CKMBINDEX, TROPONINI in the last 168 hours. BNP (last 3  results) No results for input(s): PROBNP in the last 8760 hours. HbA1C: No results for input(s): HGBA1C in the last 72 hours. CBG: Recent Labs  Lab 03/01/20 1129 03/01/20 1401 03/01/20 1531 03/01/20 2140 03/01/20 2331  GLUCAP 69* 88 118* 103* 147*   Lipid Profile: No results for input(s): CHOL, HDL, LDLCALC, TRIG, CHOLHDL, LDLDIRECT in the last 72 hours. Thyroid Function Tests: No results for input(s): TSH, T4TOTAL, FREET4, T3FREE, THYROIDAB in the last 72 hours. Anemia Panel: No results for input(s): VITAMINB12, FOLATE, FERRITIN, TIBC, IRON, RETICCTPCT in the last 72 hours. Urine analysis:    Component Value Date/Time   COLORURINE YELLOW (A) 09/23/2019 1108   APPEARANCEUR CLEAR (A) 09/23/2019 1108   LABSPEC 1.015 09/23/2019 1108   PHURINE 7.5 09/23/2019 1108   GLUCOSEU NEGATIVE 09/23/2019 1108   HGBUR NEGATIVE 09/23/2019 1108   [place] 09/23/2019 1108   [place] 09/23/2019 1108   PROTEINUR NEGATIVE 09/23/2019 1108   UROBILINOGEN 1.0 04/05/2009 0021   NITRITE NEGATIVE 09/23/2019 1108   LEUKOCYTESUR NEGATIVE 09/23/2019 1108   Sepsis Labs: @LABRCNTIP (procalcitonin:4,lacticidven:4)  )No results found for this or any previous visit (from the past 240 hour(s)).  Radiology Studies: No results found.   Scheduled Meds: . amLODipine  10 mg Per Tube Daily  . chlorhexidine  15 mL Mouth Rinse BID  . cholecalciferol  1,000 Units Oral Daily  . dextrose  1 Tube Oral Once  . dextrose  25 g Intravenous Once  . enoxaparin (LOVENOX) injection  40 mg Subcutaneous Daily  . feeding supplement (JEVITY 1.2 CAL)  474 mL Per Tube 4x daily  . feeding supplement (PRO-STAT SUGAR FREE 64)  30 mL Per Tube QID  . folic acid  1 mg Per Tube Daily  . free water  200 mL Per Tube Q4H  . hydrALAZINE  25 mg Per Tube Q8H  . insulin aspart  0-15 Units Subcutaneous Q4H  . levETIRAcetam  500 mg Per Tube BID  . mouth rinse  15 mL Mouth Rinse q12n4p  . metoprolol tartrate  25  mg Per Tube BID  . multivitamin  15 mL Per Tube Daily  . polyethylene glycol  17 g Oral BID  . QUEtiapine  50 mg Per Tube QHS  . thiamine  100 mg Per Tube Daily   Continuous Infusions:   LOS: 176 days   Time Spent in minutes   30 minutes  Dalylah Ramey D.O. on 03/03/2020 at 1:06 PM  Between 7am to 7pm - Please see pager noted on [email or web address]  After 7pm go to [email or web address]  And look for the night coverage person covering for me after hours  [facility] Office  [phone]

## 2020-03-04 DIAGNOSIS — J9601 Acute respiratory failure with hypoxia: Secondary | ICD-10-CM | POA: Diagnosis not present

## 2020-03-04 DIAGNOSIS — I611 Nontraumatic intracerebral hemorrhage in hemisphere, cortical: Secondary | ICD-10-CM | POA: Diagnosis not present

## 2020-03-04 DIAGNOSIS — I619 Nontraumatic intracerebral hemorrhage, unspecified: Secondary | ICD-10-CM | POA: Diagnosis not present

## 2020-03-04 DIAGNOSIS — N179 Acute kidney failure, unspecified: Secondary | ICD-10-CM | POA: Diagnosis not present

## 2020-03-04 LAB — GLUCOSE, CAPILLARY
Glucose-Capillary: 100 mg/dL — ABNORMAL HIGH (ref 70–99)
Glucose-Capillary: 104 mg/dL — ABNORMAL HIGH (ref 70–99)
Glucose-Capillary: 105 mg/dL — ABNORMAL HIGH (ref 70–99)
Glucose-Capillary: 117 mg/dL — ABNORMAL HIGH (ref 70–99)
Glucose-Capillary: 137 mg/dL — ABNORMAL HIGH (ref 70–99)
Glucose-Capillary: 142 mg/dL — ABNORMAL HIGH (ref 70–99)
Glucose-Capillary: 57 mg/dL — ABNORMAL LOW (ref 70–99)
Glucose-Capillary: 82 mg/dL (ref 70–99)
Glucose-Capillary: 87 mg/dL (ref 70–99)
Glucose-Capillary: 91 mg/dL (ref 70–99)
Glucose-Capillary: 92 mg/dL (ref 70–99)
Glucose-Capillary: 94 mg/dL (ref 70–99)
Glucose-Capillary: 94 mg/dL (ref 70–99)
Glucose-Capillary: 94 mg/dL (ref 70–99)
Glucose-Capillary: 95 mg/dL (ref 70–99)
Glucose-Capillary: 98 mg/dL (ref 70–99)
Glucose-Capillary: 104 mg/dL — ABNORMAL HIGH (ref 70–99)
Glucose-Capillary: 96 mg/dL (ref 70–99)
Glucose-Capillary: 112 mg/dL — ABNORMAL HIGH (ref 70–99)

## 2020-03-04 NOTE — Progress Notes (Signed)
PROGRESS NOTE    Joshua [name]  MHD:[id number] DOB: March 23, 1981 DOA: 09/09/2019 PCP: Patient, [name]   Brief Narrative:  Joshua [name] is an 39 y.o. male admitted by neurosurgery on 09/09/2019 for seizures right hemiplegia and decreased responsiveness was found to have a left hemispheric intracranial hemorrhage intubated and admitted to the ICU underwent craniotomy for decompression on 09/09/2019 by Dr. Vertell Limber with a slow neurological recovery and prolonged hospitalization due to seizure and aspiration pneumonia continues to be awake but significantly dysphagia. A PEG tube was placed on 09/25/2019 disposition continues to be challenging as he is uninsured and needs long-term placement. Assessment & Plan   Left intracranial hemorrhage -Patient underwent decompressive craniotomy and hematoma evacuation on 09/09/2019 it was felt to be likely due to uncontrolled hypertension CT angio did not show vascular malformation. -Remains aphasic and nonverbal with right hemiplegia and significant cognitive impairment Dr. Vertell Limber did not recommend replacement of his skull bone due to lack of progression. -Awaiting skilled nursing facility placement [facility] is on board appreciate assistance.  Seizure disorder -Continue Keppra.  Klebsiella UTI -Completed course of antibiotics with cefazolin  Essential hypertension -He appears to be controlled, continue amlodipine, metoprolol, hydralazine  Aspiration pneumonia -Course of antibiotics  Nutrition -He is status post PEG tube placement currently on tube feedings.  Chronic kidney disease stage III -Patient has been refusing his blood drawn  History of alcoholism -Patient completed clonidine taper for presumed alcohol withdrawal earlier in hospital stay  Mild anemia of chronic disease -Has not allowed for laboratory draws since January  DVT Prophylaxis  lovenox  Code Status: Full  Family Communication: None at bedside  Disposition Plan: Admitted.  Pending SNF- difficult to place.   Consultants Trauma surgery PCCM Neurosurgery Neurology Gastroenterology  Procedures  Intubation/extubation Decompressive craniotomy and hematoma evacuation, implantation of physical flap in the right abdomen PEG tube placement EGD Echocardiogram Renal ultrasound   Antibiotics   Anti-infectives (From admission, onward)   Start     Dose/Rate Route Frequency Ordered Stop   09/27/19 1130  cephALEXin (KEFLEX) 250 MG/5ML suspension 500 mg     500 mg Per Tube Every 12 hours 09/27/19 1052 09/29/19 2250   09/25/19 1445  ceFAZolin (ANCEF) IVPB 2g/100 mL premix  Status:  Discontinued     2 g 200 mL/hr over 30 Minutes Intravenous Every 12 hours 09/25/19 1417 09/27/19 1052   09/14/19 1000  Ampicillin-Sulbactam (UNASYN) 3 g in sodium chloride 0.9 % 100 mL IVPB     3 g 200 mL/hr over 30 Minutes Intravenous Every 8 hours 09/14/19 0922 09/17/19 1816   09/13/19 1100  vancomycin (VANCOCIN) IVPB 750 mg/150 ml premix  Status:  Discontinued     750 mg 150 mL/hr over 60 Minutes Intravenous Every 24 hours 09/13/19 0728 09/13/19 0838   09/12/19 1100  vancomycin (VANCOCIN) IVPB 1000 mg/200 mL premix  Status:  Discontinued     1,000 mg 200 mL/hr over 60 Minutes Intravenous Every 24 hours 09/11/19 1034 09/13/19 0728   09/11/19 1130  piperacillin-tazobactam (ZOSYN) IVPB 3.375 g  Status:  Discontinued     3.375 g 12.5 mL/hr over 240 Minutes Intravenous Every 8 hours 09/11/19 1054 09/14/19 0845   09/11/19 1100  vancomycin (VANCOCIN) 1,500 mg in sodium chloride 0.9 % 500 mL IVPB     1,500 mg 250 mL/hr over 120 Minutes Intravenous  Once 09/11/19 1034 09/11/19 1900   09/11/19 1015  piperacillin-tazobactam (ZOSYN) IVPB 4.5 g  Status:  Discontinued     4.5 g  200 mL/hr over 30 Minutes Intravenous Every 8 hours 09/11/19 1010 09/11/19 1052   09/09/19 0845  ceFAZolin (ANCEF) IVPB 2g/100 mL premix     2 g 200 mL/hr over 30 Minutes Intravenous Every 8 hours 09/09/19 0842  09/09/19 1658      Subjective:   Joshua [name] seen and examined today.  Patient nonverbal.  Does shake his head to some questions.    Objective:   Vitals:   03/03/20 2334 03/04/20 0354 03/04/20 0815 03/04/20 1138  BP: 106/73 109/84 114/79 123/80  Pulse: 88 79 83 80  Resp: 17 16 18 16   Temp: 98.4 F (36.9 C) 98 F (36.7 C) 98.2 F (36.8 C) 98.3 F (36.8 C)  TempSrc: Oral Oral Axillary Oral  SpO2: 97% 100% 100% 99%  Weight:      Height:        Intake/Output Summary (Last 24 hours) at 03/04/2020 1145 Last data filed at 03/04/2020 0003 Gross per 24 hour  Intake 874 ml  Output --  Net 874 ml   Filed Weights   02/21/20 0500 02/25/20 0406 02/29/20 0333  Weight: 60 kg 59 kg 59 kg    Exam (no change from previous day - 3/7)  General: Well developed, thin, NAD  Cardiovascular: S1 S2 auscultated, RRR  Respiratory: Clear to auscultation bilaterally   Abdomen: Soft, nontender, nondistended, + bowel sounds  Extremities: warm dry without cyanosis clubbing or edema  Neuro: Cannot fully assess, patient is awake. Shakes his head no to some questions such as obtaining labs.    Data Reviewed: I have personally reviewed following labs and imaging studies  CBC: No results for input(s): WBC, NEUTROABS, HGB, HCT, MCV, PLT in the last 168 hours. Basic Metabolic Panel: No results for input(s): NA, K, CL, CO2, GLUCOSE, BUN, CREATININE, CALCIUM, MG, PHOS in the last 168 hours. GFR: CrCl cannot be calculated (Patient's most recent lab result is older than the maximum 21 days allowed.). Liver Function Tests: No results for input(s): AST, ALT, ALKPHOS, BILITOT, PROT, ALBUMIN in the last 168 hours. No results for input(s): LIPASE, AMYLASE in the last 168 hours. No results for input(s): AMMONIA in the last 168 hours. Coagulation Profile: No results for input(s): INR, PROTIME in the last 168 hours. Cardiac Enzymes: No results for input(s): CKTOTAL, CKMB, CKMBINDEX, TROPONINI in the  last 168 hours. BNP (last 3 results) No results for input(s): PROBNP in the last 8760 hours. HbA1C: No results for input(s): HGBA1C in the last 72 hours. CBG: Recent Labs  Lab 03/03/20 1552 03/03/20 1944 03/03/20 2333 03/04/20 0353 03/04/20 0803  GLUCAP 82 104* 137* 95 94   Lipid Profile: No results for input(s): CHOL, HDL, LDLCALC, TRIG, CHOLHDL, LDLDIRECT in the last 72 hours. Thyroid Function Tests: No results for input(s): TSH, T4TOTAL, FREET4, T3FREE, THYROIDAB in the last 72 hours. Anemia Panel: No results for input(s): VITAMINB12, FOLATE, FERRITIN, TIBC, IRON, RETICCTPCT in the last 72 hours. Urine analysis:    Component Value Date/Time   COLORURINE YELLOW (A) 09/23/2019 1108   APPEARANCEUR CLEAR (A) 09/23/2019 1108   LABSPEC 1.015 09/23/2019 1108   PHURINE 7.5 09/23/2019 1108   GLUCOSEU NEGATIVE 09/23/2019 1108   HGBUR NEGATIVE 09/23/2019 1108   [place] 09/23/2019 1108   [place] 09/23/2019 1108   PROTEINUR NEGATIVE 09/23/2019 1108   UROBILINOGEN 1.0 04/05/2009 0021   NITRITE NEGATIVE 09/23/2019 1108   LEUKOCYTESUR NEGATIVE 09/23/2019 1108   Sepsis Labs: @LABRCNTIP (procalcitonin:4,lacticidven:4)  )No results found for this or any previous visit (  from the past 240 hour(s)).    Radiology Studies: No results found.   Scheduled Meds: . amLODipine  10 mg Per Tube Daily  . chlorhexidine  15 mL Mouth Rinse BID  . cholecalciferol  1,000 Units Oral Daily  . dextrose  1 Tube Oral Once  . dextrose  25 g Intravenous Once  . enoxaparin (LOVENOX) injection  40 mg Subcutaneous Daily  . feeding supplement (JEVITY 1.2 CAL)  474 mL Per Tube 4x daily  . feeding supplement (PRO-STAT SUGAR FREE 64)  30 mL Per Tube QID  . folic acid  1 mg Per Tube Daily  . free water  200 mL Per Tube Q4H  . hydrALAZINE  25 mg Per Tube Q8H  . insulin aspart  0-15 Units Subcutaneous Q4H  . levETIRAcetam  500 mg Per Tube BID  . mouth rinse  15 mL Mouth Rinse q12n4p   . metoprolol tartrate  25 mg Per Tube BID  . multivitamin  15 mL Per Tube Daily  . polyethylene glycol  17 g Oral BID  . QUEtiapine  50 mg Per Tube QHS  . thiamine  100 mg Per Tube Daily   Continuous Infusions:   LOS: 177 days   Time Spent in minutes   20 minutes  Shaylynne Lunt D.O. on 03/04/2020 at 11:45 AM  Between 7am to 7pm - Please see pager noted on [email or web address]  After 7pm go to [email or web address]  And look for the night coverage person covering for me after hours  [facility] Office  [phone]

## 2020-03-04 NOTE — TOC Progression Note (Signed)
Transition of Care [facility]) - Progression Note    Patient Details  Name: Joshua [name] MRN: [id number] Date of Birth: October 02, 1981  Transition of Care [facility]) CM/SW [place], [place] Phone Number: 03/04/2020, 2:09 PM  Clinical Narrative:   CSW received call from [facility] with [place], on behalf of patient's son. Jasmine sent CSW a request for information due to state involvement in the son's care. Jasmine inquired as to the patient's current condition, as the son is asking for visitation with his father. CSW provided update on the patient's condition, and Delana Meyer was appreciative. CSW explained current visitation policy, and Jasmine asked about the possibility of setting up an electronic/video visit. Jasmine to discuss with the patient's son and get back to CSW with information about scheduling a FaceTime call. CSW recommended to [facility] to have supports available for the patient's son if they do schedule a call, as the patient's condition may be difficult for the son if he has not seen the patient in some time. Jasmine was in agreement.  CSW continuing to follow for SNF placement for patient. Patient continues to have no bed offers. CSW to follow.    Expected Discharge Plan: Skilled Nursing Facility Barriers to Discharge: SNF Pending payor source - [employer], SNF Pending bed offer, Inadequate or no insurance  Expected Discharge Plan and Services Expected Discharge Plan: [place] In-house Referral: Clinical Social Work Discharge Planning Services: NA Post Acute Care Choice: [place] Living arrangements for the past 2 months: Apartment                 DME Arranged: N/A DME Agency: NA       HH Arranged: NA HH Agency: NA         Social Determinants of Health (SDOH) Interventions    Readmission Risk Interventions No flowsheet data found.

## 2020-03-05 DIAGNOSIS — N179 Acute kidney failure, unspecified: Secondary | ICD-10-CM | POA: Diagnosis not present

## 2020-03-05 DIAGNOSIS — I611 Nontraumatic intracerebral hemorrhage in hemisphere, cortical: Secondary | ICD-10-CM | POA: Diagnosis not present

## 2020-03-05 DIAGNOSIS — I619 Nontraumatic intracerebral hemorrhage, unspecified: Secondary | ICD-10-CM | POA: Diagnosis not present

## 2020-03-05 DIAGNOSIS — J9601 Acute respiratory failure with hypoxia: Secondary | ICD-10-CM | POA: Diagnosis not present

## 2020-03-05 LAB — GLUCOSE, CAPILLARY
Glucose-Capillary: 124 mg/dL — ABNORMAL HIGH (ref 70–99)
Glucose-Capillary: 99 mg/dL (ref 70–99)
Glucose-Capillary: 113 mg/dL — ABNORMAL HIGH (ref 70–99)
Glucose-Capillary: 117 mg/dL — ABNORMAL HIGH (ref 70–99)
Glucose-Capillary: 94 mg/dL (ref 70–99)
Glucose-Capillary: 124 mg/dL — ABNORMAL HIGH (ref 70–99)

## 2020-03-05 NOTE — Progress Notes (Signed)
PROGRESS NOTE    Joshua [name]  ERX:[id number] DOB: 1981/11/02 DOA: 09/09/2019 PCP: Patient, [name]   Brief Narrative:  Joshua [name] is an 39 y.o. male admitted by neurosurgery on 09/09/2019 for seizures right hemiplegia and decreased responsiveness was found to have a left hemispheric intracranial hemorrhage intubated and admitted to the ICU underwent craniotomy for decompression on 09/09/2019 by Dr. Vertell Limber with a slow neurological recovery and prolonged hospitalization due to seizure and aspiration pneumonia continues to be awake but significantly dysphagia. A PEG tube was placed on 09/25/2019 disposition continues to be challenging as he is uninsured and needs long-term placement. Assessment & Plan   Left intracranial hemorrhage -Patient underwent decompressive craniotomy and hematoma evacuation on 09/09/2019 it was felt to be likely due to uncontrolled hypertension CT angio did not show vascular malformation. -Remains aphasic and nonverbal with right hemiplegia and significant cognitive impairment Dr. Vertell Limber did not recommend replacement of his skull bone due to lack of progression. -Awaiting skilled nursing facility placement [facility] is on board appreciate assistance.  Seizure disorder -Continue Keppra.  Klebsiella UTI -Completed course of antibiotics with cefazolin  Essential hypertension -He appears to be controlled, continue amlodipine, metoprolol, hydralazine  Aspiration pneumonia -Course of antibiotics  Nutrition -He is status post PEG tube placement currently on tube feedings.  Chronic kidney disease stage III -Patient has been refusing his blood drawn  History of alcoholism -Patient completed clonidine taper for presumed alcohol withdrawal earlier in hospital stay  Mild anemia of chronic disease -Has not allowed for laboratory draws since January  DVT Prophylaxis  lovenox  Code Status: Full  Family Communication: None at bedside  Disposition Plan: Admitted.  Pending SNF- difficult to place.   Consultants Trauma surgery PCCM Neurosurgery Neurology Gastroenterology  Procedures  Intubation/extubation Decompressive craniotomy and hematoma evacuation, implantation of physical flap in the right abdomen PEG tube placement EGD Echocardiogram Renal ultrasound   Antibiotics   Anti-infectives (From admission, onward)   Start     Dose/Rate Route Frequency Ordered Stop   09/27/19 1130  cephALEXin (KEFLEX) 250 MG/5ML suspension 500 mg     500 mg Per Tube Every 12 hours 09/27/19 1052 09/29/19 2250   09/25/19 1445  ceFAZolin (ANCEF) IVPB 2g/100 mL premix  Status:  Discontinued     2 g 200 mL/hr over 30 Minutes Intravenous Every 12 hours 09/25/19 1417 09/27/19 1052   09/14/19 1000  Ampicillin-Sulbactam (UNASYN) 3 g in sodium chloride 0.9 % 100 mL IVPB     3 g 200 mL/hr over 30 Minutes Intravenous Every 8 hours 09/14/19 0922 09/17/19 1816   09/13/19 1100  vancomycin (VANCOCIN) IVPB 750 mg/150 ml premix  Status:  Discontinued     750 mg 150 mL/hr over 60 Minutes Intravenous Every 24 hours 09/13/19 0728 09/13/19 0838   09/12/19 1100  vancomycin (VANCOCIN) IVPB 1000 mg/200 mL premix  Status:  Discontinued     1,000 mg 200 mL/hr over 60 Minutes Intravenous Every 24 hours 09/11/19 1034 09/13/19 0728   09/11/19 1130  piperacillin-tazobactam (ZOSYN) IVPB 3.375 g  Status:  Discontinued     3.375 g 12.5 mL/hr over 240 Minutes Intravenous Every 8 hours 09/11/19 1054 09/14/19 0845   09/11/19 1100  vancomycin (VANCOCIN) 1,500 mg in sodium chloride 0.9 % 500 mL IVPB     1,500 mg 250 mL/hr over 120 Minutes Intravenous  Once 09/11/19 1034 09/11/19 1900   09/11/19 1015  piperacillin-tazobactam (ZOSYN) IVPB 4.5 g  Status:  Discontinued     4.5 g  200 mL/hr over 30 Minutes Intravenous Every 8 hours 09/11/19 1010 09/11/19 1052   09/09/19 0845  ceFAZolin (ANCEF) IVPB 2g/100 mL premix     2 g 200 mL/hr over 30 Minutes Intravenous Every 8 hours 09/09/19 0842  09/09/19 1658      Subjective:   Joshua [name] seen and examined today.  Patient nonverbal.    Objective:   Vitals:   03/04/20 2016 03/05/20 0004 03/05/20 0408 03/05/20 0844  BP: 126/82 120/86 (!) 125/96 114/81  Pulse: 91 87 75 86  Resp: 17 16 17 18   Temp: 97.9 F (36.6 C) 98.7 F (37.1 C) 97.6 F (36.4 C) 98.2 F (36.8 C)  TempSrc: Oral Oral Oral Oral  SpO2: 100% 98% 100% 98%  Weight:      Height:        Intake/Output Summary (Last 24 hours) at 03/05/2020 1130 Last data filed at 03/05/2020 0845 Gross per 24 hour  Intake 994 ml  Output --  Net 994 ml   Filed Weights   02/21/20 0500 02/25/20 0406 02/29/20 0333  Weight: 60 kg 59 kg 59 kg   Exam  General: Well developed, thin, NAD  HEENT: NCAT, mucous membranes moist.   Neuro: Awake. Cannot fully assess. Patient does shake his head to some questions.   Data Reviewed: I have personally reviewed following labs and imaging studies  CBC: No results for input(s): WBC, NEUTROABS, HGB, HCT, MCV, PLT in the last 168 hours. Basic Metabolic Panel: No results for input(s): NA, K, CL, CO2, GLUCOSE, BUN, CREATININE, CALCIUM, MG, PHOS in the last 168 hours. GFR: CrCl cannot be calculated (Patient's most recent lab result is older than the maximum 21 days allowed.). Liver Function Tests: No results for input(s): AST, ALT, ALKPHOS, BILITOT, PROT, ALBUMIN in the last 168 hours. No results for input(s): LIPASE, AMYLASE in the last 168 hours. No results for input(s): AMMONIA in the last 168 hours. Coagulation Profile: No results for input(s): INR, PROTIME in the last 168 hours. Cardiac Enzymes: No results for input(s): CKTOTAL, CKMB, CKMBINDEX, TROPONINI in the last 168 hours. BNP (last 3 results) No results for input(s): PROBNP in the last 8760 hours. HbA1C: No results for input(s): HGBA1C in the last 72 hours. CBG: Recent Labs  Lab 03/04/20 1617 03/04/20 1930 03/05/20 0002 03/05/20 0406 03/05/20 0903  GLUCAP 96 112*  124* 99 113*   Lipid Profile: No results for input(s): CHOL, HDL, LDLCALC, TRIG, CHOLHDL, LDLDIRECT in the last 72 hours. Thyroid Function Tests: No results for input(s): TSH, T4TOTAL, FREET4, T3FREE, THYROIDAB in the last 72 hours. Anemia Panel: No results for input(s): VITAMINB12, FOLATE, FERRITIN, TIBC, IRON, RETICCTPCT in the last 72 hours. Urine analysis:    Component Value Date/Time   COLORURINE YELLOW (A) 09/23/2019 1108   APPEARANCEUR CLEAR (A) 09/23/2019 1108   LABSPEC 1.015 09/23/2019 1108   PHURINE 7.5 09/23/2019 1108   GLUCOSEU NEGATIVE 09/23/2019 1108   HGBUR NEGATIVE 09/23/2019 1108   [place] 09/23/2019 1108   [place] 09/23/2019 1108   PROTEINUR NEGATIVE 09/23/2019 1108   UROBILINOGEN 1.0 04/05/2009 0021   NITRITE NEGATIVE 09/23/2019 1108   LEUKOCYTESUR NEGATIVE 09/23/2019 1108   Sepsis Labs: @LABRCNTIP (procalcitonin:4,lacticidven:4)  )No results found for this or any previous visit (from the past 240 hour(s)).    Radiology Studies: No results found.   Scheduled Meds: . amLODipine  10 mg Per Tube Daily  . chlorhexidine  15 mL Mouth Rinse BID  . cholecalciferol  1,000 Units Oral Daily  .  dextrose  1 Tube Oral Once  . dextrose  25 g Intravenous Once  . enoxaparin (LOVENOX) injection  40 mg Subcutaneous Daily  . feeding supplement (JEVITY 1.2 CAL)  474 mL Per Tube 4x daily  . feeding supplement (PRO-STAT SUGAR FREE 64)  30 mL Per Tube QID  . folic acid  1 mg Per Tube Daily  . free water  200 mL Per Tube Q4H  . hydrALAZINE  25 mg Per Tube Q8H  . insulin aspart  0-15 Units Subcutaneous Q4H  . levETIRAcetam  500 mg Per Tube BID  . mouth rinse  15 mL Mouth Rinse q12n4p  . metoprolol tartrate  25 mg Per Tube BID  . multivitamin  15 mL Per Tube Daily  . polyethylene glycol  17 g Oral BID  . QUEtiapine  50 mg Per Tube QHS  . thiamine  100 mg Per Tube Daily   Continuous Infusions:   LOS: 178 days   Time Spent in minutes   15  minutes  Dilynn Munroe D.O. on 03/05/2020 at 11:30 AM  Between 7am to 7pm - Please see pager noted on [email or web address]  After 7pm go to [email or web address]  And look for the night coverage person covering for me after hours  [facility] Office  [phone]

## 2020-03-05 NOTE — Progress Notes (Signed)
Nutrition Follow-up  **RD working remotely**  DOCUMENTATION CODES:   Severe malnutrition in context of acute illness/injury  INTERVENTION:  ContinueJevity 1.2 formul[place] PEG at goal volume of 474 ml (2 cartons/ARCs) given QID.   Provide 30 ml ProstatQID per tube.   Free water flushes of200 ml every 4 hours per tube. (MD to adjust as appropriate)  Tube feeding to provide 2675 kcal, 165grams of protein, and2771m free water.  Continue PO intake for comfort as appropriate/tolerated.   NUTRITION DIAGNOSIS:   Severe Malnutrition related to acute illness(intracerebral hemorrage) as evidenced by severe fat depletion, severe muscle depletion.  Ongoing.  GOAL:   Patient will meet greater than or equal to 90% of their needs  Met with tube feeding.   MONITOR:   TF tolerance, Skin, Weight trends, Labs, I & O's  REASON FOR ASSESSMENT:   Ventilator, Consult Enteral/tube feeding initiation and management  ASSESSMENT:   39yo male admitted with large left intracranial hemorrhage; S/P craniotomy. PMH includes HTN, alcoholism, noncompliance.  9/12left frontal temporal craniectomy and evacuation hematoma.  9/23 Extubated.  10/1 PEG placed 11/23- Nutrition Focused Physical Exam performed, severe subcutaneous fat loss and severe muscle wasting identified.   Discussed pt with RN.   Per MD, placement continues to be challenging as pt is uninsured and needs long-term placement.   Pt continues to refuse labs; last lab 01/17/20.   Pt noted to have accepted portions of 2 meals over the last week -- consumed 5% of one meal and 20% of breakfast today.   Medications reviewed and include: Vitamin D3, 474mJevity 1.2 cal QID, 3071mro-stat QID, 200m53mee water Q4, SSI, MVI, Vitamin B1  No labs completed since 1/20.  CBGs 99-124  Diet Order:   Diet Order            Diet regular Room service appropriate? Yes; Fluid consistency: Thin  Diet effective now               EDUCATION NEEDS:   Not appropriate for education at this time  Skin:  Skin Assessment: Skin Integrity Issues: Skin Integrity Issues:: Other (Comment) Other: skin tear right hip  Last BM:  03/03/20  Height:   Ht Readings from Last 1 Encounters:  09/09/19 '5\' 9"'  (1.753 m)    Weight:   Wt Readings from Last 1 Encounters:  02/29/20 59 kg    BMI:  Body mass index is 19.21 kg/m.  Estimated Nutritional Needs:   Kcal:  [id number]:  130-170 grams  Fluid:  >/= 2 L/day   AmanLarkin Ina, RD, LDN RD pager number and weekend/on-call pager number located in [email or web address]

## 2020-03-06 ENCOUNTER — Inpatient Hospital Stay (HOSPITAL_COMMUNITY)
Admission: RE | Admit: 2020-03-06 | Payer: Medicaid Other | Source: Intra-hospital | Admitting: Physical Medicine & Rehabilitation

## 2020-03-06 DIAGNOSIS — N184 Chronic kidney disease, stage 4 (severe): Secondary | ICD-10-CM | POA: Diagnosis not present

## 2020-03-06 DIAGNOSIS — N179 Acute kidney failure, unspecified: Secondary | ICD-10-CM | POA: Diagnosis not present

## 2020-03-06 DIAGNOSIS — G936 Cerebral edema: Secondary | ICD-10-CM | POA: Diagnosis not present

## 2020-03-06 LAB — GLUCOSE, CAPILLARY
Glucose-Capillary: 101 mg/dL — ABNORMAL HIGH (ref 70–99)
Glucose-Capillary: 112 mg/dL — ABNORMAL HIGH (ref 70–99)
Glucose-Capillary: 113 mg/dL — ABNORMAL HIGH (ref 70–99)
Glucose-Capillary: 130 mg/dL — ABNORMAL HIGH (ref 70–99)
Glucose-Capillary: 91 mg/dL (ref 70–99)
Glucose-Capillary: 96 mg/dL (ref 70–99)

## 2020-03-06 NOTE — Progress Notes (Signed)
PROGRESS NOTE    Joshua [name]  LPF:[id number] DOB: [birth year] DOA: 09/09/2019 PCP: Patient, [name]    Brief Narrative:  39 y.o.maleadmitted by neurosurgery on 09/09/2019 for seizures right hemiplegia and decreased responsiveness was found to have a left hemispheric intracranial hemorrhage intubated and admitted to the ICU underwent craniotomy for decompression on 09/09/2019 by Dr. Vertell Limber with a slow neurological recovery and prolonged hospitalization due to seizure and aspiration pneumonia continues to be awake but significantly dysphagia. A PEG tube was placed on 09/25/2019 disposition remains challenging as he is uninsured and needs long-term placement  Assessment & Plan:   Principal Problem:   ICH (intracerebral hemorrhage) ([place]) Active Problems:   Subdural hematoma (HCC)   Intracerebral hemorrhage (HCC)   Cytotoxic brain edema (HCC)   Hemorrhagic stroke (HCC)   Acute respiratory failure ([place])   Essential hypertension   Severe protein-calorie malnutrition ([place])   CKD (chronic kidney disease), stage IV (HCC)   Seizure ([place])   AKI (acute kidney injury) ([place])   Left intracranial hemorrhage -Patient underwent decompressive craniotomy and hematoma evacuation on 09/09/2019 it was felt to be likely due to uncontrolled hypertension CT angio did not show vascular malformation. -Remains aphasic and nonverbal with right hemiplegia and significant cognitive impairment Dr. Vertell Limber did not recommend replacement of his skull bone due to lack of progression. -Still awaiting skilled nursing facility placement [facility] is on board appreciate assistance.  Seizure disorder -Continue Keppra as tolerated  Klebsiella UTI -Completed course of antibiotics with cefazolin, noted  Essential hypertension -He appears to be controlled, continue amlodipine, metoprolol, hydralazine  Aspiration pneumonia -Course of antibiotics completed  Nutrition -He is status post PEG tube placement currently on  tube feedings.  Chronic kidney disease stage III -Patient has been refusing his blood drawn  History of alcoholism -Patient completed clonidine taper for presumed alcohol withdrawal earlier in hospital stay  Mild anemia of chronic disease -Has not allowed for laboratory draws since January, noted  DVT prophylaxis: Lovenox subq Code Status: Full Family Communication: Pt in room, family not at bedside Disposition Plan: From home, plan SNF when bed available  Consultants:  Trauma surgery PCCM Neurosurgery Neurology Gastroenterology  Procedures:  Intubation/extubation Decompressive craniotomy and hematoma evacuation, implantation of physical flap in the right abdomen PEG tube placement EGD Echocardiogram Renal ultrasound  Antimicrobials: Anti-infectives (From admission, onward)   Start     Dose/Rate Route Frequency Ordered Stop   09/27/19 1130  cephALEXin (KEFLEX) 250 MG/5ML suspension 500 mg     500 mg Per Tube Every 12 hours 09/27/19 1052 09/29/19 2250   09/25/19 1445  ceFAZolin (ANCEF) IVPB 2g/100 mL premix  Status:  Discontinued     2 g 200 mL/hr over 30 Minutes Intravenous Every 12 hours 09/25/19 1417 09/27/19 1052   09/14/19 1000  Ampicillin-Sulbactam (UNASYN) 3 g in sodium chloride 0.9 % 100 mL IVPB     3 g 200 mL/hr over 30 Minutes Intravenous Every 8 hours 09/14/19 0922 09/17/19 1816   09/13/19 1100  vancomycin (VANCOCIN) IVPB 750 mg/150 ml premix  Status:  Discontinued     750 mg 150 mL/hr over 60 Minutes Intravenous Every 24 hours 09/13/19 0728 09/13/19 0838   09/12/19 1100  vancomycin (VANCOCIN) IVPB 1000 mg/200 mL premix  Status:  Discontinued     1,000 mg 200 mL/hr over 60 Minutes Intravenous Every 24 hours 09/11/19 1034 09/13/19 0728   09/11/19 1130  piperacillin-tazobactam (ZOSYN) IVPB 3.375 g  Status:  Discontinued     3.375 g  12.5 mL/hr over 240 Minutes Intravenous Every 8 hours 09/11/19 1054 09/14/19 0845   09/11/19 1100  vancomycin (VANCOCIN)  1,500 mg in sodium chloride 0.9 % 500 mL IVPB     1,500 mg 250 mL/hr over 120 Minutes Intravenous  Once 09/11/19 1034 09/11/19 1900   09/11/19 1015  piperacillin-tazobactam (ZOSYN) IVPB 4.5 g  Status:  Discontinued     4.5 g 200 mL/hr over 30 Minutes Intravenous Every 8 hours 09/11/19 1010 09/11/19 1052   09/09/19 0845  ceFAZolin (ANCEF) IVPB 2g/100 mL premix     2 g 200 mL/hr over 30 Minutes Intravenous Every 8 hours 09/09/19 0842 09/09/19 1658       Subjective: Not conversant. Remains nonverbal  Objective: Vitals:   03/06/20 0003 03/06/20 0348 03/06/20 0923 03/06/20 1154  BP: 118/90 120/87 118/90 115/88  Pulse: 90 91 90 84  Resp: 17 17 16 17   Temp: 98.8 F (37.1 C) 98 F (36.7 C) 98.4 F (36.9 C) 98.7 F (37.1 C)  TempSrc: Oral Oral Oral Oral  SpO2: 99% 100% 100% 99%  Weight:      Height:        Intake/Output Summary (Last 24 hours) at 03/06/2020 1412 Last data filed at 03/06/2020 0818 Gross per 24 hour  Intake 80 ml  Output --  Net 80 ml   Filed Weights   02/21/20 0500 02/25/20 0406 02/29/20 0333  Weight: 60 kg 59 kg 59 kg    Examination:  General exam: Appears calm and comfortable  Respiratory system: Clear to auscultation. Respiratory effort normal. Cardiovascular system: S1 & S2 heard, Regular Gastrointestinal system: Abdomen is nondistended, soft and nontender. No organomegaly or masses felt. Normal bowel sounds heard. Central nervous system: Alert . No focal neurological deficits. Extremities: Symmetric 5 x 5 power. Skin: No rashes, lesions  Psychiatry: unable to assess as pt is nonverbal  Data Reviewed: I have personally reviewed following labs and imaging studies  CBC: No results for input(s): WBC, NEUTROABS, HGB, HCT, MCV, PLT in the last 168 hours. Basic Metabolic Panel: No results for input(s): NA, K, CL, CO2, GLUCOSE, BUN, CREATININE, CALCIUM, MG, PHOS in the last 168 hours. GFR: CrCl cannot be calculated (Patient's most recent lab result is  older than the maximum 21 days allowed.). Liver Function Tests: No results for input(s): AST, ALT, ALKPHOS, BILITOT, PROT, ALBUMIN in the last 168 hours. No results for input(s): LIPASE, AMYLASE in the last 168 hours. No results for input(s): AMMONIA in the last 168 hours. Coagulation Profile: No results for input(s): INR, PROTIME in the last 168 hours. Cardiac Enzymes: No results for input(s): CKTOTAL, CKMB, CKMBINDEX, TROPONINI in the last 168 hours. BNP (last 3 results) No results for input(s): PROBNP in the last 8760 hours. HbA1C: No results for input(s): HGBA1C in the last 72 hours. CBG: Recent Labs  Lab 03/05/20 1952 03/06/20 0002 03/06/20 0347 03/06/20 0727 03/06/20 1131  GLUCAP 124* 113* 96 91 130*   Lipid Profile: No results for input(s): CHOL, HDL, LDLCALC, TRIG, CHOLHDL, LDLDIRECT in the last 72 hours. Thyroid Function Tests: No results for input(s): TSH, T4TOTAL, FREET4, T3FREE, THYROIDAB in the last 72 hours. Anemia Panel: No results for input(s): VITAMINB12, FOLATE, FERRITIN, TIBC, IRON, RETICCTPCT in the last 72 hours. Sepsis Labs: No results for input(s): PROCALCITON, LATICACIDVEN in the last 168 hours.  No results found for this or any previous visit (from the past 240 hour(s)).   Radiology Studies: No results found.  Scheduled Meds: . amLODipine  10 mg  Per Tube Daily  . chlorhexidine  15 mL Mouth Rinse BID  . cholecalciferol  1,000 Units Oral Daily  . dextrose  1 Tube Oral Once  . dextrose  25 g Intravenous Once  . enoxaparin (LOVENOX) injection  40 mg Subcutaneous Daily  . feeding supplement (JEVITY 1.2 CAL)  474 mL Per Tube 4x daily  . feeding supplement (PRO-STAT SUGAR FREE 64)  30 mL Per Tube QID  . folic acid  1 mg Per Tube Daily  . free water  200 mL Per Tube Q4H  . hydrALAZINE  25 mg Per Tube Q8H  . insulin aspart  0-15 Units Subcutaneous Q4H  . levETIRAcetam  500 mg Per Tube BID  . mouth rinse  15 mL Mouth Rinse q12n4p  . metoprolol  tartrate  25 mg Per Tube BID  . multivitamin  15 mL Per Tube Daily  . polyethylene glycol  17 g Oral BID  . QUEtiapine  50 mg Per Tube QHS  . thiamine  100 mg Per Tube Daily   Continuous Infusions:   LOS: 179 days   Marylu Lund, MD [facility] Pager On Amion  If 7PM-7AM, please contact night-coverage 03/06/2020, 2:13 PM

## 2020-03-07 DIAGNOSIS — G936 Cerebral edema: Secondary | ICD-10-CM | POA: Diagnosis not present

## 2020-03-07 LAB — GLUCOSE, CAPILLARY
Glucose-Capillary: 108 mg/dL — ABNORMAL HIGH (ref 70–99)
Glucose-Capillary: 114 mg/dL — ABNORMAL HIGH (ref 70–99)
Glucose-Capillary: 120 mg/dL — ABNORMAL HIGH (ref 70–99)
Glucose-Capillary: 141 mg/dL — ABNORMAL HIGH (ref 70–99)
Glucose-Capillary: 90 mg/dL (ref 70–99)
Glucose-Capillary: 91 mg/dL (ref 70–99)

## 2020-03-07 NOTE — Progress Notes (Signed)
PROGRESS NOTE    Joshua [name]  KDT:[id number] DOB: [birth year] DOA: 09/09/2019 PCP: Patient, [name]    Brief Narrative:  39 y.o.maleadmitted by neurosurgery on 09/09/2019 for seizures right hemiplegia and decreased responsiveness was found to have a left hemispheric intracranial hemorrhage intubated and admitted to the ICU underwent craniotomy for decompression on 09/09/2019 by Dr. Vertell Limber with a slow neurological recovery and prolonged hospitalization due to seizure and aspiration pneumonia continues to be awake but significantly dysphagia. A PEG tube was placed on 09/25/2019 disposition remains challenging as he is uninsured and needs long-term placement  Assessment & Plan:   Principal Problem:   ICH (intracerebral hemorrhage) ([place]) Active Problems:   Subdural hematoma (HCC)   Intracerebral hemorrhage (HCC)   Cytotoxic brain edema (HCC)   Hemorrhagic stroke (HCC)   Acute respiratory failure ([place])   Essential hypertension   Severe protein-calorie malnutrition ([place])   CKD (chronic kidney disease), stage IV (HCC)   Seizure (HCC)   AKI (acute kidney injury) ([place])   Left intracranial hemorrhage -Patient underwent decompressive craniotomy and hematoma evacuation on 09/09/2019 it was felt to be likely due to uncontrolled hypertension CT angio did not show vascular malformation. -Remains aphasic and nonverbal with right hemiplegia and significant cognitive impairment Dr. Vertell Limber did not recommend replacement of his skull bone due to lack of progression. -Still awaiting skilled nursing facility placement TOC continuing to follow   Seizure disorder -Continue Keppra as tolerated  Klebsiella UTI -Completed course of antibiotics with cefazolin, noted  Essential hypertension -He appears to be controlled, continue amlodipine, metoprolol, hydralazine  Aspiration pneumonia -Course of antibiotics completed  Nutrition -He is status post PEG tube placement currently on tube  feedings.  Chronic kidney disease stage III -Patient has been refusing his blood drawn  History of alcoholism -Patient completed clonidine taper for presumed alcohol withdrawal earlier in hospital stay  Mild anemia of chronic disease -Has not allowed for laboratory draws since January, noted  DVT prophylaxis: Lovenox subq Code Status: Full Family Communication: Pt in room, family not at bedside Disposition Plan: From home, plan SNF when bed available  Consultants:  Trauma surgery PCCM Neurosurgery Neurology Gastroenterology  Procedures:  Intubation/extubation Decompressive craniotomy and hematoma evacuation, implantation of physical flap in the right abdomen PEG tube placement EGD Echocardiogram Renal ultrasound  Antimicrobials: Anti-infectives (From admission, onward)   Start     Dose/Rate Route Frequency Ordered Stop   09/27/19 1130  cephALEXin (KEFLEX) 250 MG/5ML suspension 500 mg     500 mg Per Tube Every 12 hours 09/27/19 1052 09/29/19 2250   09/25/19 1445  ceFAZolin (ANCEF) IVPB 2g/100 mL premix  Status:  Discontinued     2 g 200 mL/hr over 30 Minutes Intravenous Every 12 hours 09/25/19 1417 09/27/19 1052   09/14/19 1000  Ampicillin-Sulbactam (UNASYN) 3 g in sodium chloride 0.9 % 100 mL IVPB     3 g 200 mL/hr over 30 Minutes Intravenous Every 8 hours 09/14/19 0922 09/17/19 1816   09/13/19 1100  vancomycin (VANCOCIN) IVPB 750 mg/150 ml premix  Status:  Discontinued     750 mg 150 mL/hr over 60 Minutes Intravenous Every 24 hours 09/13/19 0728 09/13/19 0838   09/12/19 1100  vancomycin (VANCOCIN) IVPB 1000 mg/200 mL premix  Status:  Discontinued     1,000 mg 200 mL/hr over 60 Minutes Intravenous Every 24 hours 09/11/19 1034 09/13/19 0728   09/11/19 1130  piperacillin-tazobactam (ZOSYN) IVPB 3.375 g  Status:  Discontinued     3.375 g 12.5  mL/hr over 240 Minutes Intravenous Every 8 hours 09/11/19 1054 09/14/19 0845   09/11/19 1100  vancomycin (VANCOCIN) 1,500  mg in sodium chloride 0.9 % 500 mL IVPB     1,500 mg 250 mL/hr over 120 Minutes Intravenous  Once 09/11/19 1034 09/11/19 1900   09/11/19 1015  piperacillin-tazobactam (ZOSYN) IVPB 4.5 g  Status:  Discontinued     4.5 g 200 mL/hr over 30 Minutes Intravenous Every 8 hours 09/11/19 1010 09/11/19 1052   09/09/19 0845  ceFAZolin (ANCEF) IVPB 2g/100 mL premix     2 g 200 mL/hr over 30 Minutes Intravenous Every 8 hours 09/09/19 0842 09/09/19 1658      Subjective: Still nonverbal this AM, but smiles occasionally when spoken to  Objective: Vitals:   03/07/20 0004 03/07/20 0413 03/07/20 0727 03/07/20 1146  BP: 119/84 (!) 125/96 127/84 126/79  Pulse: 96 85 91 96  Resp: 18 18 16 14   Temp: 98.2 F (36.8 C) 97.8 F (36.6 C) 98.2 F (36.8 C) 98.7 F (37.1 C)  TempSrc: Oral Oral    SpO2: 98% 99% 100% 100%  Weight:      Height:        Intake/Output Summary (Last 24 hours) at 03/07/2020 1500 Last data filed at 03/07/2020 0300 Gross per 24 hour  Intake 2684 ml  Output --  Net 2684 ml   Filed Weights   02/21/20 0500 02/25/20 0406 02/29/20 0333  Weight: 60 kg 59 kg 59 kg    Examination: General exam: Awake, laying in bed, in nad Respiratory system: Normal respiratory effort, no wheezing  Data Reviewed: I have personally reviewed following labs and imaging studies  CBC: No results for input(s): WBC, NEUTROABS, HGB, HCT, MCV, PLT in the last 168 hours. Basic Metabolic Panel: No results for input(s): NA, K, CL, CO2, GLUCOSE, BUN, CREATININE, CALCIUM, MG, PHOS in the last 168 hours. GFR: CrCl cannot be calculated (Patient's most recent lab result is older than the maximum 21 days allowed.). Liver Function Tests: No results for input(s): AST, ALT, ALKPHOS, BILITOT, PROT, ALBUMIN in the last 168 hours. No results for input(s): LIPASE, AMYLASE in the last 168 hours. No results for input(s): AMMONIA in the last 168 hours. Coagulation Profile: No results for input(s): INR, PROTIME in the  last 168 hours. Cardiac Enzymes: No results for input(s): CKTOTAL, CKMB, CKMBINDEX, TROPONINI in the last 168 hours. BNP (last 3 results) No results for input(s): PROBNP in the last 8760 hours. HbA1C: No results for input(s): HGBA1C in the last 72 hours. CBG: Recent Labs  Lab 03/06/20 2013 03/07/20 0046 03/07/20 0432 03/07/20 0726 03/07/20 1145  GLUCAP 112* 141* 90 91 120*   Lipid Profile: No results for input(s): CHOL, HDL, LDLCALC, TRIG, CHOLHDL, LDLDIRECT in the last 72 hours. Thyroid Function Tests: No results for input(s): TSH, T4TOTAL, FREET4, T3FREE, THYROIDAB in the last 72 hours. Anemia Panel: No results for input(s): VITAMINB12, FOLATE, FERRITIN, TIBC, IRON, RETICCTPCT in the last 72 hours. Sepsis Labs: No results for input(s): PROCALCITON, LATICACIDVEN in the last 168 hours.  No results found for this or any previous visit (from the past 240 hour(s)).   Radiology Studies: No results found.  Scheduled Meds: . amLODipine  10 mg Per Tube Daily  . chlorhexidine  15 mL Mouth Rinse BID  . cholecalciferol  1,000 Units Oral Daily  . dextrose  1 Tube Oral Once  . dextrose  25 g Intravenous Once  . enoxaparin (LOVENOX) injection  40 mg Subcutaneous Daily  .  feeding supplement (JEVITY 1.2 CAL)  474 mL Per Tube 4x daily  . feeding supplement (PRO-STAT SUGAR FREE 64)  30 mL Per Tube QID  . folic acid  1 mg Per Tube Daily  . free water  200 mL Per Tube Q4H  . hydrALAZINE  25 mg Per Tube Q8H  . insulin aspart  0-15 Units Subcutaneous Q4H  . levETIRAcetam  500 mg Per Tube BID  . mouth rinse  15 mL Mouth Rinse q12n4p  . metoprolol tartrate  25 mg Per Tube BID  . multivitamin  15 mL Per Tube Daily  . polyethylene glycol  17 g Oral BID  . QUEtiapine  50 mg Per Tube QHS  . thiamine  100 mg Per Tube Daily   Continuous Infusions:   LOS: 180 days   Marylu Lund, MD [facility] Pager On Amion  If 7PM-7AM, please contact night-coverage 03/07/2020, 3:00 PM

## 2020-03-08 DIAGNOSIS — I611 Nontraumatic intracerebral hemorrhage in hemisphere, cortical: Secondary | ICD-10-CM | POA: Diagnosis not present

## 2020-03-08 DIAGNOSIS — J9601 Acute respiratory failure with hypoxia: Secondary | ICD-10-CM | POA: Diagnosis not present

## 2020-03-08 LAB — GLUCOSE, CAPILLARY
Glucose-Capillary: 102 mg/dL — ABNORMAL HIGH (ref 70–99)
Glucose-Capillary: 108 mg/dL — ABNORMAL HIGH (ref 70–99)
Glucose-Capillary: 112 mg/dL — ABNORMAL HIGH (ref 70–99)
Glucose-Capillary: 86 mg/dL (ref 70–99)
Glucose-Capillary: 90 mg/dL (ref 70–99)
Glucose-Capillary: 96 mg/dL (ref 70–99)

## 2020-03-08 NOTE — Progress Notes (Signed)
PROGRESS NOTE    Joshua [name]  YDX:[id number] DOB: May 07, 1981 DOA: 09/09/2019 PCP: Patient, [name]    Brief Narrative:  39 y.o.maleadmitted by neurosurgery on 09/09/2019 for seizures right hemiplegia and decreased responsiveness was found to have a left hemispheric intracranial hemorrhage intubated and admitted to the ICU underwent craniotomy for decompression on 09/09/2019 by Dr. Vertell Limber with a slow neurological recovery and prolonged hospitalization due to seizure and aspiration pneumonia continues to be awake but significantly dysphagia. A PEG tube was placed on 09/25/2019 disposition remains challenging as he is uninsured and needs long-term placement  Assessment & Plan:   Principal Problem:   ICH (intracerebral hemorrhage) ([place]) Active Problems:   Subdural hematoma (HCC)   Intracerebral hemorrhage (HCC)   Cytotoxic brain edema (HCC)   Hemorrhagic stroke (HCC)   Acute respiratory failure ([place])   Essential hypertension   Severe protein-calorie malnutrition ([place])   CKD (chronic kidney disease), stage IV (HCC)   Seizure ([place])   AKI (acute kidney injury) ([place])   Left intracranial hemorrhage -Patient underwent decompressive craniotomy and hematoma evacuation on 09/09/2019 it was felt to be likely due to uncontrolled hypertension CT angio did not show vascular malformation. -Remains aphasic and nonverbal with right hemiplegia and significant cognitive impairment Dr. Vertell Limber did not recommend replacement of his skull bone due to lack of progression. -Still awaiting skilled nursing facility placement TOC. Still following   Seizure disorder -Continue Keppra as tolerated  Klebsiella UTI -Completed course of antibiotics with cefazolin, noted  Essential hypertension -He appears to be controlled, continue amlodipine, metoprolol, hydralazine  Aspiration pneumonia -Course of antibiotics completed  Nutrition -He is status post PEG tube placement currently on tube  feedings.  Chronic kidney disease stage III -Patient has been refusing his blood drawn  History of alcoholism -Patient completed clonidine taper for presumed alcohol withdrawal earlier in hospital stay  Mild anemia of chronic disease -Has not allowed for laboratory draws since January, noted  DVT prophylaxis: Lovenox subq Code Status: Full Family Communication: Pt in room, family not at bedside Disposition Plan: From home, plan SNF when bed available  Consultants:  Trauma surgery PCCM Neurosurgery Neurology Gastroenterology  Procedures:  Intubation/extubation Decompressive craniotomy and hematoma evacuation, implantation of physical flap in the right abdomen PEG tube placement EGD Echocardiogram Renal ultrasound  Antimicrobials: Anti-infectives (From admission, onward)   Start     Dose/Rate Route Frequency Ordered Stop   09/27/19 1130  cephALEXin (KEFLEX) 250 MG/5ML suspension 500 mg     500 mg Per Tube Every 12 hours 09/27/19 1052 09/29/19 2250   09/25/19 1445  ceFAZolin (ANCEF) IVPB 2g/100 mL premix  Status:  Discontinued     2 g 200 mL/hr over 30 Minutes Intravenous Every 12 hours 09/25/19 1417 09/27/19 1052   09/14/19 1000  Ampicillin-Sulbactam (UNASYN) 3 g in sodium chloride 0.9 % 100 mL IVPB     3 g 200 mL/hr over 30 Minutes Intravenous Every 8 hours 09/14/19 0922 09/17/19 1816   09/13/19 1100  vancomycin (VANCOCIN) IVPB 750 mg/150 ml premix  Status:  Discontinued     750 mg 150 mL/hr over 60 Minutes Intravenous Every 24 hours 09/13/19 0728 09/13/19 0838   09/12/19 1100  vancomycin (VANCOCIN) IVPB 1000 mg/200 mL premix  Status:  Discontinued     1,000 mg 200 mL/hr over 60 Minutes Intravenous Every 24 hours 09/11/19 1034 09/13/19 0728   09/11/19 1130  piperacillin-tazobactam (ZOSYN) IVPB 3.375 g  Status:  Discontinued     3.375 g 12.5 mL/hr  over 240 Minutes Intravenous Every 8 hours 09/11/19 1054 09/14/19 0845   09/11/19 1100  vancomycin (VANCOCIN) 1,500  mg in sodium chloride 0.9 % 500 mL IVPB     1,500 mg 250 mL/hr over 120 Minutes Intravenous  Once 09/11/19 1034 09/11/19 1900   09/11/19 1015  piperacillin-tazobactam (ZOSYN) IVPB 4.5 g  Status:  Discontinued     4.5 g 200 mL/hr over 30 Minutes Intravenous Every 8 hours 09/11/19 1010 09/11/19 1052   09/09/19 0845  ceFAZolin (ANCEF) IVPB 2g/100 mL premix     2 g 200 mL/hr over 30 Minutes Intravenous Every 8 hours 09/09/19 0842 09/09/19 1658      Subjective: Remains nonverbal  Objective: Vitals:   03/08/20 0520 03/08/20 0733 03/08/20 1103 03/08/20 1300  BP: (!) 121/95 (!) 130/95 105/78 (!) 137/93  Pulse: 80 96 100 100  Resp:  18 20   Temp:  97.6 F (36.4 C) 98.2 F (36.8 C)   TempSrc:  Oral Oral   SpO2:  100% 100%   Weight:      Height:        Intake/Output Summary (Last 24 hours) at 03/08/2020 1445 Last data filed at 03/08/2020 0900 Gross per 24 hour  Intake 1148 ml  Output --  Net 1148 ml   Filed Weights   02/25/20 0406 02/29/20 0333 03/08/20 0348  Weight: 59 kg 59 kg 61 kg    Examination: General exam: Not conversant, in no acute distress Respiratory system: normal chest rise, clear, no audible wheezing  Data Reviewed: I have personally reviewed following labs and imaging studies  CBC: No results for input(s): WBC, NEUTROABS, HGB, HCT, MCV, PLT in the last 168 hours. Basic Metabolic Panel: No results for input(s): NA, K, CL, CO2, GLUCOSE, BUN, CREATININE, CALCIUM, MG, PHOS in the last 168 hours. GFR: CrCl cannot be calculated (Patient's most recent lab result is older than the maximum 21 days allowed.). Liver Function Tests: No results for input(s): AST, ALT, ALKPHOS, BILITOT, PROT, ALBUMIN in the last 168 hours. No results for input(s): LIPASE, AMYLASE in the last 168 hours. No results for input(s): AMMONIA in the last 168 hours. Coagulation Profile: No results for input(s): INR, PROTIME in the last 168 hours. Cardiac Enzymes: No results for input(s):  CKTOTAL, CKMB, CKMBINDEX, TROPONINI in the last 168 hours. BNP (last 3 results) No results for input(s): PROBNP in the last 8760 hours. HbA1C: No results for input(s): HGBA1C in the last 72 hours. CBG: Recent Labs  Lab 03/07/20 2009 03/08/20 0008 03/08/20 0416 03/08/20 0730 03/08/20 1105  GLUCAP 114* 112* 96 90 102*   Lipid Profile: No results for input(s): CHOL, HDL, LDLCALC, TRIG, CHOLHDL, LDLDIRECT in the last 72 hours. Thyroid Function Tests: No results for input(s): TSH, T4TOTAL, FREET4, T3FREE, THYROIDAB in the last 72 hours. Anemia Panel: No results for input(s): VITAMINB12, FOLATE, FERRITIN, TIBC, IRON, RETICCTPCT in the last 72 hours. Sepsis Labs: No results for input(s): PROCALCITON, LATICACIDVEN in the last 168 hours.  No results found for this or any previous visit (from the past 240 hour(s)).   Radiology Studies: No results found.  Scheduled Meds: . amLODipine  10 mg Per Tube Daily  . chlorhexidine  15 mL Mouth Rinse BID  . cholecalciferol  1,000 Units Oral Daily  . dextrose  1 Tube Oral Once  . dextrose  25 g Intravenous Once  . enoxaparin (LOVENOX) injection  40 mg Subcutaneous Daily  . feeding supplement (JEVITY 1.2 CAL)  474 mL Per Tube 4x  daily  . feeding supplement (PRO-STAT SUGAR FREE 64)  30 mL Per Tube QID  . folic acid  1 mg Per Tube Daily  . free water  200 mL Per Tube Q4H  . hydrALAZINE  25 mg Per Tube Q8H  . insulin aspart  0-15 Units Subcutaneous Q4H  . levETIRAcetam  500 mg Per Tube BID  . mouth rinse  15 mL Mouth Rinse q12n4p  . metoprolol tartrate  25 mg Per Tube BID  . multivitamin  15 mL Per Tube Daily  . polyethylene glycol  17 g Oral BID  . QUEtiapine  50 mg Per Tube QHS  . thiamine  100 mg Per Tube Daily   Continuous Infusions:   LOS: 181 days   Marylu Lund, MD [facility] Pager On Amion  If 7PM-7AM, please contact night-coverage 03/08/2020, 2:45 PM

## 2020-03-08 NOTE — TOC Progression Note (Signed)
Transition of Care [facility]) - Progression Note    Patient Details  Name: Joshua [name] MRN: [id number] Date of Birth: 02-06-1981  Transition of Care [facility]) CM/SW [place], LCSW Phone Number: 03/08/2020, 1:55 PM  Clinical Narrative:    CSW continuing to follow for SNF placement for patient. Patient continues to have no bed offers.   Expected Discharge Plan: Skilled Nursing Facility Barriers to Discharge: SNF Pending payor source - LOG, SNF Pending bed offer, Inadequate or no insurance  Expected Discharge Plan and Services Expected Discharge Plan: [place] In-house Referral: Clinical Social Work Discharge Planning Services: NA Post Acute Care Choice: [place] Living arrangements for the past 2 months: Apartment                 DME Arranged: N/A DME Agency: NA       HH Arranged: NA HH Agency: NA         Social Determinants of Health (SDOH) Interventions    Readmission Risk Interventions No flowsheet data found.

## 2020-03-08 NOTE — Plan of Care (Signed)

## 2020-03-08 NOTE — Plan of Care (Signed)
  Problem: Clinical Measurements: Goal: Diagnostic test results will improve Outcome: Progressing Goal: Respiratory complications will improve Outcome: Progressing Goal: Cardiovascular complication will be avoided Outcome: Progressing   Problem: Activity: Goal: Risk for activity intolerance will decrease Outcome: Progressing   Problem: Nutrition: Goal: Adequate nutrition will be maintained Outcome: Progressing   Problem: Coping: Goal: Level of anxiety will decrease Outcome: Progressing   Problem: Elimination: Goal: Will not experience complications related to bowel motility Outcome: Progressing Goal: Will not experience complications related to urinary retention Outcome: Progressing   Problem: Pain Managment: Goal: General experience of comfort will improve Outcome: Progressing   Problem: Safety: Goal: Ability to remain free from injury will improve Outcome: Progressing   Problem: Skin Integrity: Goal: Risk for impaired skin integrity will decrease Outcome: Progressing   Problem: Clinical Measurements: Goal: Usual level of consciousness will be regained or maintained. Outcome: Progressing Goal: Neurologic status will improve Outcome: Progressing Goal: Ability to maintain intracranial pressure will improve Outcome: Progressing

## 2020-03-09 DIAGNOSIS — N179 Acute kidney failure, unspecified: Secondary | ICD-10-CM | POA: Diagnosis not present

## 2020-03-09 DIAGNOSIS — N184 Chronic kidney disease, stage 4 (severe): Secondary | ICD-10-CM | POA: Diagnosis not present

## 2020-03-09 LAB — GLUCOSE, CAPILLARY: Glucose-Capillary: 160 mg/dL — ABNORMAL HIGH (ref 70–99)

## 2020-03-09 NOTE — Progress Notes (Signed)
PROGRESS NOTE    Joshua [name]  LKG:[id number] DOB: [birth year] DOA: 09/09/2019 PCP: Patient, [name]    Brief Narrative:  39 y.o.maleadmitted by neurosurgery on 09/09/2019 for seizures right hemiplegia and decreased responsiveness was found to have a left hemispheric intracranial hemorrhage intubated and admitted to the ICU underwent craniotomy for decompression on 09/09/2019 by Dr. Vertell Limber with a slow neurological recovery and prolonged hospitalization due to seizure and aspiration pneumonia continues to be awake but significantly dysphagia. A PEG tube was placed on 09/25/2019 disposition remains challenging as he is uninsured and needs long-term placement  Assessment & Plan:   Principal Problem:   ICH (intracerebral hemorrhage) ([place]) Active Problems:   Subdural hematoma (HCC)   Intracerebral hemorrhage (HCC)   Cytotoxic brain edema (HCC)   Hemorrhagic stroke (HCC)   Acute respiratory failure ([place])   Essential hypertension   Severe protein-calorie malnutrition ([place])   CKD (chronic kidney disease), stage IV (HCC)   Seizure ([place])   AKI (acute kidney injury) ([place])   Left intracranial hemorrhage -Patient underwent decompressive craniotomy and hematoma evacuation on 09/09/2019 it was felt to be likely due to uncontrolled hypertension CT angio did not show vascular malformation. -Remains aphasic and nonverbal with right hemiplegia and significant cognitive impairment Dr. Vertell Limber did not recommend replacement of his skull bone due to lack of progression. -Still awaiting skilled nursing facility placement. SW following  Seizure disorder -Continue Keppra as tolerated  Klebsiella UTI -Completed course of antibiotics with cefazolin, noted  Essential hypertension -He appears to be controlled, continue amlodipine, metoprolol, hydralazine  Aspiration pneumonia -Course of antibiotics completed  Nutrition -He is status post PEG tube placement currently on tube feedings.  Chronic  kidney disease stage III -Patient has been refusing his blood drawn  History of alcoholism -Patient completed clonidine taper for presumed alcohol withdrawal earlier in hospital stay  Mild anemia of chronic disease -Has not allowed for laboratory draws since January, noted  DVT prophylaxis: Lovenox subq Code Status: Full Family Communication: Pt in room, family not at bedside Disposition Plan: From home, plan SNF when bed available  Consultants:  Trauma surgery PCCM Neurosurgery Neurology Gastroenterology  Procedures:  Intubation/extubation Decompressive craniotomy and hematoma evacuation, implantation of physical flap in the right abdomen PEG tube placement EGD Echocardiogram Renal ultrasound  Antimicrobials: Anti-infectives (From admission, onward)   Start     Dose/Rate Route Frequency Ordered Stop   09/27/19 1130  cephALEXin (KEFLEX) 250 MG/5ML suspension 500 mg     500 mg Per Tube Every 12 hours 09/27/19 1052 09/29/19 2250   09/25/19 1445  ceFAZolin (ANCEF) IVPB 2g/100 mL premix  Status:  Discontinued     2 g 200 mL/hr over 30 Minutes Intravenous Every 12 hours 09/25/19 1417 09/27/19 1052   09/14/19 1000  Ampicillin-Sulbactam (UNASYN) 3 g in sodium chloride 0.9 % 100 mL IVPB     3 g 200 mL/hr over 30 Minutes Intravenous Every 8 hours 09/14/19 0922 09/17/19 1816   09/13/19 1100  vancomycin (VANCOCIN) IVPB 750 mg/150 ml premix  Status:  Discontinued     750 mg 150 mL/hr over 60 Minutes Intravenous Every 24 hours 09/13/19 0728 09/13/19 0838   09/12/19 1100  vancomycin (VANCOCIN) IVPB 1000 mg/200 mL premix  Status:  Discontinued     1,000 mg 200 mL/hr over 60 Minutes Intravenous Every 24 hours 09/11/19 1034 09/13/19 0728   09/11/19 1130  piperacillin-tazobactam (ZOSYN) IVPB 3.375 g  Status:  Discontinued     3.375 g 12.5 mL/hr over 240  Minutes Intravenous Every 8 hours 09/11/19 1054 09/14/19 0845   09/11/19 1100  vancomycin (VANCOCIN) 1,500 mg in sodium chloride  0.9 % 500 mL IVPB     1,500 mg 250 mL/hr over 120 Minutes Intravenous  Once 09/11/19 1034 09/11/19 1900   09/11/19 1015  piperacillin-tazobactam (ZOSYN) IVPB 4.5 g  Status:  Discontinued     4.5 g 200 mL/hr over 30 Minutes Intravenous Every 8 hours 09/11/19 1010 09/11/19 1052   09/09/19 0845  ceFAZolin (ANCEF) IVPB 2g/100 mL premix     2 g 200 mL/hr over 30 Minutes Intravenous Every 8 hours 09/09/19 0842 09/09/19 1658      Subjective: Cont to be nonverbal  Objective: Vitals:   03/08/20 2347 03/09/20 0431 03/09/20 0755 03/09/20 1236  BP: (!) 126/96 125/90 (!) 126/98 125/87  Pulse: (!) 105 90 94 97  Resp: 18 16 18 19   Temp: 98.2 F (36.8 C) 98 F (36.7 C) 97.9 F (36.6 C) 98.3 F (36.8 C)  TempSrc: Oral  Oral Oral  SpO2: 100% 100% 100% 99%  Weight:      Height:        Intake/Output Summary (Last 24 hours) at 03/09/2020 1526 Last data filed at 03/09/2020 0400 Gross per 24 hour  Intake 3348 ml  Output --  Net 3348 ml   Filed Weights   02/25/20 0406 02/29/20 0333 03/08/20 0348  Weight: 59 kg 59 kg 61 kg    Examination: General exam: Awake, laying in bed, in nad Respiratory system: Normal respiratory effort, no audible wheezing  Data Reviewed: I have personally reviewed following labs and imaging studies  CBC: No results for input(s): WBC, NEUTROABS, HGB, HCT, MCV, PLT in the last 168 hours. Basic Metabolic Panel: No results for input(s): NA, K, CL, CO2, GLUCOSE, BUN, CREATININE, CALCIUM, MG, PHOS in the last 168 hours. GFR: CrCl cannot be calculated (Patient's most recent lab result is older than the maximum 21 days allowed.). Liver Function Tests: No results for input(s): AST, ALT, ALKPHOS, BILITOT, PROT, ALBUMIN in the last 168 hours. No results for input(s): LIPASE, AMYLASE in the last 168 hours. No results for input(s): AMMONIA in the last 168 hours. Coagulation Profile: No results for input(s): INR, PROTIME in the last 168 hours. Cardiac Enzymes: No  results for input(s): CKTOTAL, CKMB, CKMBINDEX, TROPONINI in the last 168 hours. BNP (last 3 results) No results for input(s): PROBNP in the last 8760 hours. HbA1C: No results for input(s): HGBA1C in the last 72 hours. CBG: Recent Labs  Lab 03/08/20 0730 03/08/20 1105 03/08/20 1609 03/08/20 1936 03/08/20 2348  GLUCAP 90 102* 108* 86 160*   Lipid Profile: No results for input(s): CHOL, HDL, LDLCALC, TRIG, CHOLHDL, LDLDIRECT in the last 72 hours. Thyroid Function Tests: No results for input(s): TSH, T4TOTAL, FREET4, T3FREE, THYROIDAB in the last 72 hours. Anemia Panel: No results for input(s): VITAMINB12, FOLATE, FERRITIN, TIBC, IRON, RETICCTPCT in the last 72 hours. Sepsis Labs: No results for input(s): PROCALCITON, LATICACIDVEN in the last 168 hours.  No results found for this or any previous visit (from the past 240 hour(s)).   Radiology Studies: No results found.  Scheduled Meds: . amLODipine  10 mg Per Tube Daily  . chlorhexidine  15 mL Mouth Rinse BID  . cholecalciferol  1,000 Units Oral Daily  . dextrose  1 Tube Oral Once  . dextrose  25 g Intravenous Once  . enoxaparin (LOVENOX) injection  40 mg Subcutaneous Daily  . feeding supplement (JEVITY 1.2 CAL)  474 mL Per Tube 4x daily  . feeding supplement (PRO-STAT SUGAR FREE 64)  30 mL Per Tube QID  . folic acid  1 mg Per Tube Daily  . free water  200 mL Per Tube Q4H  . hydrALAZINE  25 mg Per Tube Q8H  . insulin aspart  0-15 Units Subcutaneous Q4H  . levETIRAcetam  500 mg Per Tube BID  . mouth rinse  15 mL Mouth Rinse q12n4p  . metoprolol tartrate  25 mg Per Tube BID  . multivitamin  15 mL Per Tube Daily  . polyethylene glycol  17 g Oral BID  . QUEtiapine  50 mg Per Tube QHS  . thiamine  100 mg Per Tube Daily   Continuous Infusions:   LOS: 182 days   Marylu Lund, MD [facility] Pager On Amion  If 7PM-7AM, please contact night-coverage 03/09/2020, 3:26 PM

## 2020-03-10 DIAGNOSIS — N184 Chronic kidney disease, stage 4 (severe): Secondary | ICD-10-CM | POA: Diagnosis not present

## 2020-03-10 DIAGNOSIS — Z4659 Encounter for fitting and adjustment of other gastrointestinal appliance and device: Secondary | ICD-10-CM | POA: Diagnosis not present

## 2020-03-10 DIAGNOSIS — J9601 Acute respiratory failure with hypoxia: Secondary | ICD-10-CM | POA: Diagnosis not present

## 2020-03-10 NOTE — Progress Notes (Signed)
PROGRESS NOTE    Joshua [name]  BHA:[id number] DOB: 1981-03-06 DOA: 09/09/2019 PCP: Patient, [name]    Brief Narrative:  39 y.o.maleadmitted by neurosurgery on 09/09/2019 for seizures right hemiplegia and decreased responsiveness was found to have a left hemispheric intracranial hemorrhage intubated and admitted to the ICU underwent craniotomy for decompression on 09/09/2019 by Dr. Vertell Limber with a slow neurological recovery and prolonged hospitalization due to seizure and aspiration pneumonia continues to be awake but significantly dysphagia. A PEG tube was placed on 09/25/2019 disposition remains challenging as he is uninsured and needs long-term placement  Assessment & Plan:   Principal Problem:   ICH (intracerebral hemorrhage) ([place]) Active Problems:   Subdural hematoma (HCC)   Intracerebral hemorrhage (HCC)   Cytotoxic brain edema (HCC)   Hemorrhagic stroke (HCC)   Acute respiratory failure ([place])   Essential hypertension   Severe protein-calorie malnutrition ([place])   CKD (chronic kidney disease), stage IV (HCC)   Seizure ([place])   AKI (acute kidney injury) ([place])   Left intracranial hemorrhage -Patient underwent decompressive craniotomy and hematoma evacuation on 09/09/2019 it was felt to be likely due to uncontrolled hypertension CT angio did not show vascular malformation. -Remains aphasic and nonverbal with right hemiplegia and significant cognitive impairment Dr. Vertell Limber did not recommend replacement of his skull bone due to lack of progression. -Still awaiting skilled nursing facility placement. SW continues to follow  Seizure disorder -Continue Keppra as tolerated  Klebsiella UTI -Completed course of antibiotics with cefazolin, noted  Essential hypertension -He appears to be controlled, continue amlodipine, metoprolol, hydralazine  Aspiration pneumonia -Course of antibiotics completed  Nutrition -He is status post PEG tube placement currently on tube  feedings.  Chronic kidney disease stage III -Patient has been refusing his blood drawn  History of alcoholism -Patient completed clonidine taper for presumed alcohol withdrawal earlier in hospital stay  Mild anemia of chronic disease -Has not allowed for laboratory draws since January, noted  DVT prophylaxis: Lovenox subq Code Status: Full Family Communication: Pt in room, family not at bedside Disposition Plan: From home, plan SNF when bed available  Consultants:  Trauma surgery PCCM Neurosurgery Neurology Gastroenterology  Procedures:  Intubation/extubation Decompressive craniotomy and hematoma evacuation, implantation of physical flap in the right abdomen PEG tube placement EGD Echocardiogram Renal ultrasound  Antimicrobials: Anti-infectives (From admission, onward)   Start     Dose/Rate Route Frequency Ordered Stop   09/27/19 1130  cephALEXin (KEFLEX) 250 MG/5ML suspension 500 mg     500 mg Per Tube Every 12 hours 09/27/19 1052 09/29/19 2250   09/25/19 1445  ceFAZolin (ANCEF) IVPB 2g/100 mL premix  Status:  Discontinued     2 g 200 mL/hr over 30 Minutes Intravenous Every 12 hours 09/25/19 1417 09/27/19 1052   09/14/19 1000  Ampicillin-Sulbactam (UNASYN) 3 g in sodium chloride 0.9 % 100 mL IVPB     3 g 200 mL/hr over 30 Minutes Intravenous Every 8 hours 09/14/19 0922 09/17/19 1816   09/13/19 1100  vancomycin (VANCOCIN) IVPB 750 mg/150 ml premix  Status:  Discontinued     750 mg 150 mL/hr over 60 Minutes Intravenous Every 24 hours 09/13/19 0728 09/13/19 0838   09/12/19 1100  vancomycin (VANCOCIN) IVPB 1000 mg/200 mL premix  Status:  Discontinued     1,000 mg 200 mL/hr over 60 Minutes Intravenous Every 24 hours 09/11/19 1034 09/13/19 0728   09/11/19 1130  piperacillin-tazobactam (ZOSYN) IVPB 3.375 g  Status:  Discontinued     3.375 g 12.5 mL/hr  over 240 Minutes Intravenous Every 8 hours 09/11/19 1054 09/14/19 0845   09/11/19 1100  vancomycin (VANCOCIN) 1,500  mg in sodium chloride 0.9 % 500 mL IVPB     1,500 mg 250 mL/hr over 120 Minutes Intravenous  Once 09/11/19 1034 09/11/19 1900   09/11/19 1015  piperacillin-tazobactam (ZOSYN) IVPB 4.5 g  Status:  Discontinued     4.5 g 200 mL/hr over 30 Minutes Intravenous Every 8 hours 09/11/19 1010 09/11/19 1052   09/09/19 0845  ceFAZolin (ANCEF) IVPB 2g/100 mL premix     2 g 200 mL/hr over 30 Minutes Intravenous Every 8 hours 09/09/19 0842 09/09/19 1658      Subjective: Remains nonverbal  Objective: Vitals:   03/09/20 2016 03/09/20 2319 03/10/20 0339 03/10/20 0340  BP: 128/86 105/82 118/90 118/90  Pulse: (!) 105 97 88 88  Resp: 18 16  19   Temp: (!) 97.5 F (36.4 C) 98.1 F (36.7 C) 98 F (36.7 C) 98 F (36.7 C)  TempSrc: Oral Oral Oral Oral  SpO2: 99% 99%  98%  Weight:      Height:       No intake or output data in the 24 hours ending 03/10/20 1603 Filed Weights   02/25/20 0406 02/29/20 0333 03/08/20 0348  Weight: 59 kg 59 kg 61 kg    Examination: General exam: Conversant, in no acute distress Respiratory system: normal chest rise, clear, no audible wheezing  Data Reviewed: I have personally reviewed following labs and imaging studies  CBC: No results for input(s): WBC, NEUTROABS, HGB, HCT, MCV, PLT in the last 168 hours. Basic Metabolic Panel: No results for input(s): NA, K, CL, CO2, GLUCOSE, BUN, CREATININE, CALCIUM, MG, PHOS in the last 168 hours. GFR: CrCl cannot be calculated (Patient's most recent lab result is older than the maximum 21 days allowed.). Liver Function Tests: No results for input(s): AST, ALT, ALKPHOS, BILITOT, PROT, ALBUMIN in the last 168 hours. No results for input(s): LIPASE, AMYLASE in the last 168 hours. No results for input(s): AMMONIA in the last 168 hours. Coagulation Profile: No results for input(s): INR, PROTIME in the last 168 hours. Cardiac Enzymes: No results for input(s): CKTOTAL, CKMB, CKMBINDEX, TROPONINI in the last 168 hours. BNP  (last 3 results) No results for input(s): PROBNP in the last 8760 hours. HbA1C: No results for input(s): HGBA1C in the last 72 hours. CBG: Recent Labs  Lab 03/08/20 0730 03/08/20 1105 03/08/20 1609 03/08/20 1936 03/08/20 2348  GLUCAP 90 102* 108* 86 160*   Lipid Profile: No results for input(s): CHOL, HDL, LDLCALC, TRIG, CHOLHDL, LDLDIRECT in the last 72 hours. Thyroid Function Tests: No results for input(s): TSH, T4TOTAL, FREET4, T3FREE, THYROIDAB in the last 72 hours. Anemia Panel: No results for input(s): VITAMINB12, FOLATE, FERRITIN, TIBC, IRON, RETICCTPCT in the last 72 hours. Sepsis Labs: No results for input(s): PROCALCITON, LATICACIDVEN in the last 168 hours.  No results found for this or any previous visit (from the past 240 hour(s)).   Radiology Studies: No results found.  Scheduled Meds: . amLODipine  10 mg Per Tube Daily  . chlorhexidine  15 mL Mouth Rinse BID  . cholecalciferol  1,000 Units Oral Daily  . dextrose  1 Tube Oral Once  . dextrose  25 g Intravenous Once  . enoxaparin (LOVENOX) injection  40 mg Subcutaneous Daily  . feeding supplement (JEVITY 1.2 CAL)  474 mL Per Tube 4x daily  . feeding supplement (PRO-STAT SUGAR FREE 64)  30 mL Per Tube QID  .  folic acid  1 mg Per Tube Daily  . free water  200 mL Per Tube Q4H  . hydrALAZINE  25 mg Per Tube Q8H  . insulin aspart  0-15 Units Subcutaneous Q4H  . levETIRAcetam  500 mg Per Tube BID  . mouth rinse  15 mL Mouth Rinse q12n4p  . metoprolol tartrate  25 mg Per Tube BID  . multivitamin  15 mL Per Tube Daily  . polyethylene glycol  17 g Oral BID  . QUEtiapine  50 mg Per Tube QHS  . thiamine  100 mg Per Tube Daily   Continuous Infusions:   LOS: 183 days   Marylu Lund, MD [facility] Pager On Amion  If 7PM-7AM, please contact night-coverage 03/10/2020, 4:03 PM

## 2020-03-11 DIAGNOSIS — J9601 Acute respiratory failure with hypoxia: Secondary | ICD-10-CM | POA: Diagnosis not present

## 2020-03-11 DIAGNOSIS — Z4659 Encounter for fitting and adjustment of other gastrointestinal appliance and device: Secondary | ICD-10-CM | POA: Diagnosis not present

## 2020-03-11 DIAGNOSIS — G936 Cerebral edema: Secondary | ICD-10-CM | POA: Diagnosis not present

## 2020-03-11 LAB — GLUCOSE, CAPILLARY
Glucose-Capillary: 104 mg/dL — ABNORMAL HIGH (ref 70–99)
Glucose-Capillary: 105 mg/dL — ABNORMAL HIGH (ref 70–99)
Glucose-Capillary: 105 mg/dL — ABNORMAL HIGH (ref 70–99)
Glucose-Capillary: 118 mg/dL — ABNORMAL HIGH (ref 70–99)
Glucose-Capillary: 120 mg/dL — ABNORMAL HIGH (ref 70–99)
Glucose-Capillary: 125 mg/dL — ABNORMAL HIGH (ref 70–99)
Glucose-Capillary: 128 mg/dL — ABNORMAL HIGH (ref 70–99)
Glucose-Capillary: 128 mg/dL — ABNORMAL HIGH (ref 70–99)
Glucose-Capillary: 135 mg/dL — ABNORMAL HIGH (ref 70–99)
Glucose-Capillary: 178 mg/dL — ABNORMAL HIGH (ref 70–99)
Glucose-Capillary: 78 mg/dL (ref 70–99)
Glucose-Capillary: 82 mg/dL (ref 70–99)
Glucose-Capillary: 88 mg/dL (ref 70–99)
Glucose-Capillary: 92 mg/dL (ref 70–99)
Glucose-Capillary: 93 mg/dL (ref 70–99)
Glucose-Capillary: 98 mg/dL (ref 70–99)
Glucose-Capillary: 98 mg/dL (ref 70–99)
Glucose-Capillary: 158 mg/dL — ABNORMAL HIGH (ref 70–99)
Glucose-Capillary: 103 mg/dL — ABNORMAL HIGH (ref 70–99)
Glucose-Capillary: 79 mg/dL (ref 70–99)

## 2020-03-11 NOTE — Progress Notes (Signed)
PROGRESS NOTE    Joshua [name]  CXK:[id number] DOB: 18-Jan-1981 DOA: 09/09/2019 PCP: Patient, [name]    Brief Narrative:  39 y.o.maleadmitted by neurosurgery on 09/09/2019 for seizures right hemiplegia and decreased responsiveness was found to have a left hemispheric intracranial hemorrhage intubated and admitted to the ICU underwent craniotomy for decompression on 09/09/2019 by Dr. Vertell Limber with a slow neurological recovery and prolonged hospitalization due to seizure and aspiration pneumonia continues to be awake but significantly dysphagia. A PEG tube was placed on 09/25/2019 disposition remains challenging as he is uninsured and needs long-term placement  Assessment & Plan:   Principal Problem:   ICH (intracerebral hemorrhage) ([place]) Active Problems:   Subdural hematoma (HCC)   Intracerebral hemorrhage (HCC)   Cytotoxic brain edema (HCC)   Hemorrhagic stroke (HCC)   Acute respiratory failure ([place])   Essential hypertension   Severe protein-calorie malnutrition ([place])   CKD (chronic kidney disease), stage IV (HCC)   Seizure ([place])   AKI (acute kidney injury) ([place])   Left intracranial hemorrhage -Patient underwent decompressive craniotomy and hematoma evacuation on 09/09/2019 it was felt to be likely due to uncontrolled hypertension CT angio did not show vascular malformation. -Remains aphasic and nonverbal with right hemiplegia and significant cognitive impairment Dr. Vertell Limber did not recommend replacement of his skull bone due to lack of progression. -Still awaiting skilled nursing facility placement. SW is following  Seizure disorder -Continue Keppra as tolerated  Klebsiella UTI -Completed course of antibiotics with cefazolin, noted  Essential hypertension -He appears to be controlled, continue amlodipine, metoprolol, hydralazine  Aspiration pneumonia -Course of antibiotics completed  Nutrition -He is status post PEG tube placement currently on tube  feedings.  Chronic kidney disease stage III -Patient has been refusing his blood drawn  History of alcoholism -Patient completed clonidine taper for presumed alcohol withdrawal earlier in hospital stay  Mild anemia of chronic disease -Has not allowed for laboratory draws since January, noted  DVT prophylaxis: Lovenox subq Code Status: Full Family Communication: Pt in room, family not at bedside Disposition Plan: From home, plan SNF when bed available  Consultants:  Trauma surgery PCCM Neurosurgery Neurology Gastroenterology  Procedures:  Intubation/extubation Decompressive craniotomy and hematoma evacuation, implantation of physical flap in the right abdomen PEG tube placement EGD Echocardiogram Renal ultrasound  Antimicrobials: Anti-infectives (From admission, onward)   Start     Dose/Rate Route Frequency Ordered Stop   09/27/19 1130  cephALEXin (KEFLEX) 250 MG/5ML suspension 500 mg     500 mg Per Tube Every 12 hours 09/27/19 1052 09/29/19 2250   09/25/19 1445  ceFAZolin (ANCEF) IVPB 2g/100 mL premix  Status:  Discontinued     2 g 200 mL/hr over 30 Minutes Intravenous Every 12 hours 09/25/19 1417 09/27/19 1052   09/14/19 1000  Ampicillin-Sulbactam (UNASYN) 3 g in sodium chloride 0.9 % 100 mL IVPB     3 g 200 mL/hr over 30 Minutes Intravenous Every 8 hours 09/14/19 0922 09/17/19 1816   09/13/19 1100  vancomycin (VANCOCIN) IVPB 750 mg/150 ml premix  Status:  Discontinued     750 mg 150 mL/hr over 60 Minutes Intravenous Every 24 hours 09/13/19 0728 09/13/19 0838   09/12/19 1100  vancomycin (VANCOCIN) IVPB 1000 mg/200 mL premix  Status:  Discontinued     1,000 mg 200 mL/hr over 60 Minutes Intravenous Every 24 hours 09/11/19 1034 09/13/19 0728   09/11/19 1130  piperacillin-tazobactam (ZOSYN) IVPB 3.375 g  Status:  Discontinued     3.375 g 12.5 mL/hr over  240 Minutes Intravenous Every 8 hours 09/11/19 1054 09/14/19 0845   09/11/19 1100  vancomycin (VANCOCIN) 1,500  mg in sodium chloride 0.9 % 500 mL IVPB     1,500 mg 250 mL/hr over 120 Minutes Intravenous  Once 09/11/19 1034 09/11/19 1900   09/11/19 1015  piperacillin-tazobactam (ZOSYN) IVPB 4.5 g  Status:  Discontinued     4.5 g 200 mL/hr over 30 Minutes Intravenous Every 8 hours 09/11/19 1010 09/11/19 1052   09/09/19 0845  ceFAZolin (ANCEF) IVPB 2g/100 mL premix     2 g 200 mL/hr over 30 Minutes Intravenous Every 8 hours 09/09/19 0842 09/09/19 1658      Subjective: Cont to be non-verbal, awake  Objective: Vitals:   03/10/20 2318 03/11/20 0344 03/11/20 0700 03/11/20 1118  BP: 125/87 (!) 128/98 (!) 128/94 129/80  Pulse: 100 88 89 93  Resp:  16  17  Temp: 98.9 F (37.2 C) 97.8 F (36.6 C) 98.3 F (36.8 C) 98.6 F (37 C)  TempSrc: Oral Oral Oral Oral  SpO2: 100% 100% 100% 100%  Weight:      Height:       No intake or output data in the 24 hours ending 03/11/20 1359 Filed Weights   02/25/20 0406 02/29/20 0333 03/08/20 0348  Weight: 59 kg 59 kg 61 kg    Examination: General exam: Awake, laying in bed, in nad Respiratory system: Normal respiratory effort, no audible wheezing  Data Reviewed: I have personally reviewed following labs and imaging studies  CBC: No results for input(s): WBC, NEUTROABS, HGB, HCT, MCV, PLT in the last 168 hours. Basic Metabolic Panel: No results for input(s): NA, K, CL, CO2, GLUCOSE, BUN, CREATININE, CALCIUM, MG, PHOS in the last 168 hours. GFR: CrCl cannot be calculated (Patient's most recent lab result is older than the maximum 21 days allowed.). Liver Function Tests: No results for input(s): AST, ALT, ALKPHOS, BILITOT, PROT, ALBUMIN in the last 168 hours. No results for input(s): LIPASE, AMYLASE in the last 168 hours. No results for input(s): AMMONIA in the last 168 hours. Coagulation Profile: No results for input(s): INR, PROTIME in the last 168 hours. Cardiac Enzymes: No results for input(s): CKTOTAL, CKMB, CKMBINDEX, TROPONINI in the last 168  hours. BNP (last 3 results) No results for input(s): PROBNP in the last 8760 hours. HbA1C: No results for input(s): HGBA1C in the last 72 hours. CBG: Recent Labs  Lab 03/10/20 2035 03/11/20 0044 03/11/20 0331 03/11/20 0832 03/11/20 1113  GLUCAP 105* 125* 92 98 158*   Lipid Profile: No results for input(s): CHOL, HDL, LDLCALC, TRIG, CHOLHDL, LDLDIRECT in the last 72 hours. Thyroid Function Tests: No results for input(s): TSH, T4TOTAL, FREET4, T3FREE, THYROIDAB in the last 72 hours. Anemia Panel: No results for input(s): VITAMINB12, FOLATE, FERRITIN, TIBC, IRON, RETICCTPCT in the last 72 hours. Sepsis Labs: No results for input(s): PROCALCITON, LATICACIDVEN in the last 168 hours.  No results found for this or any previous visit (from the past 240 hour(s)).   Radiology Studies: No results found.  Scheduled Meds: . amLODipine  10 mg Per Tube Daily  . chlorhexidine  15 mL Mouth Rinse BID  . cholecalciferol  1,000 Units Oral Daily  . dextrose  1 Tube Oral Once  . dextrose  25 g Intravenous Once  . enoxaparin (LOVENOX) injection  40 mg Subcutaneous Daily  . feeding supplement (JEVITY 1.2 CAL)  474 mL Per Tube 4x daily  . feeding supplement (PRO-STAT SUGAR FREE 64)  30 mL Per Tube  QID  . folic acid  1 mg Per Tube Daily  . free water  200 mL Per Tube Q4H  . hydrALAZINE  25 mg Per Tube Q8H  . insulin aspart  0-15 Units Subcutaneous Q4H  . levETIRAcetam  500 mg Per Tube BID  . mouth rinse  15 mL Mouth Rinse q12n4p  . metoprolol tartrate  25 mg Per Tube BID  . multivitamin  15 mL Per Tube Daily  . polyethylene glycol  17 g Oral BID  . QUEtiapine  50 mg Per Tube QHS  . thiamine  100 mg Per Tube Daily   Continuous Infusions:   LOS: 184 days   Marylu Lund, MD [facility] Pager On Amion  If 7PM-7AM, please contact night-coverage 03/11/2020, 1:59 PM

## 2020-03-12 DIAGNOSIS — J9601 Acute respiratory failure with hypoxia: Secondary | ICD-10-CM | POA: Diagnosis not present

## 2020-03-12 DIAGNOSIS — G936 Cerebral edema: Secondary | ICD-10-CM | POA: Diagnosis not present

## 2020-03-12 DIAGNOSIS — Z01818 Encounter for other preprocedural examination: Secondary | ICD-10-CM | POA: Diagnosis not present

## 2020-03-12 DIAGNOSIS — I10 Essential (primary) hypertension: Secondary | ICD-10-CM | POA: Diagnosis not present

## 2020-03-12 LAB — GLUCOSE, CAPILLARY
Glucose-Capillary: 127 mg/dL — ABNORMAL HIGH (ref 70–99)
Glucose-Capillary: 90 mg/dL (ref 70–99)
Glucose-Capillary: 93 mg/dL (ref 70–99)
Glucose-Capillary: 90 mg/dL (ref 70–99)
Glucose-Capillary: 91 mg/dL (ref 70–99)
Glucose-Capillary: 100 mg/dL — ABNORMAL HIGH (ref 70–99)

## 2020-03-12 NOTE — Progress Notes (Signed)
PROGRESS NOTE    Joshua [name]  EAV:[id number] DOB: 1981-02-07 DOA: 09/09/2019 PCP: Joshua [name], [name]    Brief Narrative:  39 y.o.maleadmitted by neurosurgery on 09/09/2019 for seizures right hemiplegia and decreased responsiveness was found to have a left hemispheric intracranial hemorrhage intubated and admitted to the ICU underwent craniotomy for decompression on 09/09/2019 by Dr. Vertell Limber with a slow neurological recovery and prolonged hospitalization due to seizure and aspiration pneumonia continues to be awake but significantly dysphagia. A PEG tube was placed on 09/25/2019 disposition remains challenging as he is uninsured and needs long-term placement  Assessment & Plan:   Principal Problem:   ICH (intracerebral hemorrhage) ([place]) Active Problems:   Subdural hematoma (HCC)   Intracerebral hemorrhage (HCC)   Cytotoxic brain edema (HCC)   Hemorrhagic stroke (HCC)   Acute respiratory failure ([place])   Essential hypertension   Severe protein-calorie malnutrition ([place])   CKD (chronic kidney disease), stage IV (HCC)   Seizure ([place])   AKI (acute kidney injury) ([place])   Left intracranial hemorrhage -Joshua [name] underwent decompressive craniotomy and hematoma evacuation on 09/09/2019 it was felt to be likely due to uncontrolled hypertension CT angio did not show vascular malformation. -Remains aphasic and nonverbal with right hemiplegia and significant cognitive impairment Dr. Vertell Limber did not recommend replacement of his skull bone due to lack of progression. -Still awaiting skilled nursing facility placement. SW continues to follow  Seizure disorder -Continue Keppra as tolerated  Klebsiella UTI -Completed course of antibiotics with cefazolin, noted  Essential hypertension -He appears to be controlled, continue amlodipine, metoprolol, hydralazine  Aspiration pneumonia -Course of antibiotics completed  Nutrition -He is status post PEG tube placement currently on tube  feedings.  Chronic kidney disease stage III -Joshua [name] has been refusing his blood drawn  History of alcoholism -Joshua [name] completed clonidine taper for presumed alcohol withdrawal earlier in hospital stay  Mild anemia of chronic disease -Has not allowed for laboratory draws since January, noted  DVT prophylaxis: Lovenox subq Code Status: Full Family Communication: Pt in room, family not at bedside Disposition Plan: From home, plan SNF when bed available  Consultants:  Trauma surgery PCCM Neurosurgery Neurology Gastroenterology  Procedures:  Intubation/extubation Decompressive craniotomy and hematoma evacuation, implantation of physical flap in the right abdomen PEG tube placement EGD Echocardiogram Renal ultrasound  Antimicrobials: Anti-infectives (From admission, onward)   Start     Dose/Rate Route Frequency Ordered Stop   09/27/19 1130  cephALEXin (KEFLEX) 250 MG/5ML suspension 500 mg     500 mg Per Tube Every 12 hours 09/27/19 1052 09/29/19 2250   09/25/19 1445  ceFAZolin (ANCEF) IVPB 2g/100 mL premix  Status:  Discontinued     2 g 200 mL/hr over 30 Minutes Intravenous Every 12 hours 09/25/19 1417 09/27/19 1052   09/14/19 1000  Ampicillin-Sulbactam (UNASYN) 3 g in sodium chloride 0.9 % 100 mL IVPB     3 g 200 mL/hr over 30 Minutes Intravenous Every 8 hours 09/14/19 0922 09/17/19 1816   09/13/19 1100  vancomycin (VANCOCIN) IVPB 750 mg/150 ml premix  Status:  Discontinued     750 mg 150 mL/hr over 60 Minutes Intravenous Every 24 hours 09/13/19 0728 09/13/19 0838   09/12/19 1100  vancomycin (VANCOCIN) IVPB 1000 mg/200 mL premix  Status:  Discontinued     1,000 mg 200 mL/hr over 60 Minutes Intravenous Every 24 hours 09/11/19 1034 09/13/19 0728   09/11/19 1130  piperacillin-tazobactam (ZOSYN) IVPB 3.375 g  Status:  Discontinued     3.375 g 12.5 mL/hr  over 240 Minutes Intravenous Every 8 hours 09/11/19 1054 09/14/19 0845   09/11/19 1100  vancomycin (VANCOCIN) 1,500  mg in sodium chloride 0.9 % 500 mL IVPB     1,500 mg 250 mL/hr over 120 Minutes Intravenous  Once 09/11/19 1034 09/11/19 1900   09/11/19 1015  piperacillin-tazobactam (ZOSYN) IVPB 4.5 g  Status:  Discontinued     4.5 g 200 mL/hr over 30 Minutes Intravenous Every 8 hours 09/11/19 1010 09/11/19 1052   09/09/19 0845  ceFAZolin (ANCEF) IVPB 2g/100 mL premix     2 g 200 mL/hr over 30 Minutes Intravenous Every 8 hours 09/09/19 0842 09/09/19 1658      Subjective: Remains non-verbal  Objective: Vitals:   03/11/20 2343 03/12/20 0351 03/12/20 0741 03/12/20 1251  BP: 102/76 103/84 (!) 128/94 (!) 128/99  Pulse: 93 76 89 100  Resp: 17 17 15 15   Temp: 98.2 F (36.8 C) 98.1 F (36.7 C) 98.4 F (36.9 C) 98.1 F (36.7 C)  TempSrc: Oral Oral Axillary Oral  SpO2: 100% 100% 100% 99%  Weight:      Height:       No intake or output data in the 24 hours ending 03/12/20 1450 Filed Weights   02/25/20 0406 02/29/20 0333 03/08/20 0348  Weight: 59 kg 59 kg 61 kg    Examination: General exam: Conversant, in no acute distress Respiratory system: normal chest rise, clear, no audible wheezing  Data Reviewed: I have personally reviewed following labs and imaging studies  CBC: No results for input(s): WBC, NEUTROABS, HGB, HCT, MCV, PLT in the last 168 hours. Basic Metabolic Panel: No results for input(s): NA, K, CL, CO2, GLUCOSE, BUN, CREATININE, CALCIUM, MG, PHOS in the last 168 hours. GFR: CrCl cannot be calculated (Joshua [name]'s most recent lab result is older than the maximum 21 days allowed.). Liver Function Tests: No results for input(s): AST, ALT, ALKPHOS, BILITOT, PROT, ALBUMIN in the last 168 hours. No results for input(s): LIPASE, AMYLASE in the last 168 hours. No results for input(s): AMMONIA in the last 168 hours. Coagulation Profile: No results for input(s): INR, PROTIME in the last 168 hours. Cardiac Enzymes: No results for input(s): CKTOTAL, CKMB, CKMBINDEX, TROPONINI in the last 168  hours. BNP (last 3 results) No results for input(s): PROBNP in the last 8760 hours. HbA1C: No results for input(s): HGBA1C in the last 72 hours. CBG: Recent Labs  Lab 03/11/20 1943 03/11/20 2345 03/12/20 0352 03/12/20 0755 03/12/20 1252  GLUCAP 79 127* 93 90 90   Lipid Profile: No results for input(s): CHOL, HDL, LDLCALC, TRIG, CHOLHDL, LDLDIRECT in the last 72 hours. Thyroid Function Tests: No results for input(s): TSH, T4TOTAL, FREET4, T3FREE, THYROIDAB in the last 72 hours. Anemia Panel: No results for input(s): VITAMINB12, FOLATE, FERRITIN, TIBC, IRON, RETICCTPCT in the last 72 hours. Sepsis Labs: No results for input(s): PROCALCITON, LATICACIDVEN in the last 168 hours.  No results found for this or any previous visit (from the past 240 hour(s)).   Radiology Studies: No results found.  Scheduled Meds: . amLODipine  10 mg Per Tube Daily  . chlorhexidine  15 mL Mouth Rinse BID  . cholecalciferol  1,000 Units Oral Daily  . dextrose  1 Tube Oral Once  . dextrose  25 g Intravenous Once  . enoxaparin (LOVENOX) injection  40 mg Subcutaneous Daily  . feeding supplement (JEVITY 1.2 CAL)  474 mL Per Tube 4x daily  . feeding supplement (PRO-STAT SUGAR FREE 64)  30 mL Per Tube QID  .  folic acid  1 mg Per Tube Daily  . free water  200 mL Per Tube Q4H  . hydrALAZINE  25 mg Per Tube Q8H  . insulin aspart  0-15 Units Subcutaneous Q4H  . levETIRAcetam  500 mg Per Tube BID  . mouth rinse  15 mL Mouth Rinse q12n4p  . metoprolol tartrate  25 mg Per Tube BID  . multivitamin  15 mL Per Tube Daily  . polyethylene glycol  17 g Oral BID  . QUEtiapine  50 mg Per Tube QHS  . thiamine  100 mg Per Tube Daily   Continuous Infusions:   LOS: 185 days   Marylu Lund, MD [facility] Pager On Amion  If 7PM-7AM, please contact night-coverage 03/12/2020, 2:50 PM

## 2020-03-12 NOTE — Progress Notes (Signed)
Nutrition Follow-up  DOCUMENTATION CODES:   Severe malnutrition in context of acute illness/injury  INTERVENTION:  ContinueJevity 1.2 formul[place] PEG at goal volume of 474 ml (2 cartons/ARCs) given QID.   Provide 30 ml ProstatQID per tube.   Free water flushes of200 ml every 4 hours per tube. (MD to adjust as appropriate)  Tube feeding to provide 2675 kcal, 165grams of protein, and2779m free water.  Continue PO intake for comfort as appropriate/tolerated.   NUTRITION DIAGNOSIS:   Severe Malnutrition related to acute illness(intracerebral hemorrage) as evidenced by severe fat depletion, severe muscle depletion.  Ongoing.  GOAL:   Patient will meet greater than or equal to 90% of their needs  Met with TF.   MONITOR:   TF tolerance, Skin, Weight trends, Labs, I & O's  REASON FOR ASSESSMENT:   Ventilator, Consult Enteral/tube feeding initiation and management  ASSESSMENT:   39yo male admitted with large left intracranial hemorrhage; S/P craniotomy. PMH includes HTN, alcoholism, noncompliance.  9/12left frontal temporal craniectomy and evacuation hematoma.  9/23 Extubated.  10/1 PEG placed 11/23- Nutrition Focused Physical Exam performed, severe subcutaneous fat loss and severe muscle wasting identified.   Pt shakes head "no" when asked about abdominal pain. Discussed pt with RN.   Per MD, placement continues to be challenging as pt is uninsured and needs long-term placement.   Pt noted to have accepted 20% of 1 meal over the last week.  Admission weight: 63 kg Current weight: 61 kg  Medications reviewed and include: Vitamin D3, 4755mJevity 1.2 cal QID, 3056mro-stat QID, 200m75mee water Q4, SSI, MVI, Vitamin B1, Miralax, Glutose  No labs completed since 1/20.  CBGs 79-127  Diet Order:   Diet Order            Diet regular Room service appropriate? Yes; Fluid consistency: Thin  Diet effective now              EDUCATION NEEDS:    Not appropriate for education at this time  Skin:  Skin Assessment: Skin Integrity Issues: Skin Integrity Issues:: Other (Comment) Other: skin tear right hip  Last BM:  03/12/20  Height:   Ht Readings from Last 1 Encounters:  09/09/19 '5\' 9"'  (1.753 m)    Weight:   Wt Readings from Last 1 Encounters:  03/08/20 61 kg    BMI:  Body mass index is 19.86 kg/m.  Estimated Nutritional Needs:   Kcal:  [id number]:  130-170 grams  Fluid:  >/= 2 L/day   AmanLarkin Ina, RD, LDN RD pager number and weekend/on-call pager number located in [phone]

## 2020-03-12 NOTE — Plan of Care (Signed)
  Problem: Activity: Goal: Risk for activity intolerance will decrease Outcome: Progressing   Problem: Nutrition: Goal: Adequate nutrition will be maintained Outcome: Progressing   Problem: Coping: Goal: Level of anxiety will decrease Outcome: Progressing   Problem: Elimination: Goal: Will not experience complications related to bowel motility Outcome: Progressing   Problem: Skin Integrity: Goal: Risk for impaired skin integrity will decrease Outcome: Progressing   Problem: Safety: Goal: Ability to remain free from injury will improve Outcome: Progressing

## 2020-03-13 DIAGNOSIS — N184 Chronic kidney disease, stage 4 (severe): Secondary | ICD-10-CM | POA: Diagnosis not present

## 2020-03-13 DIAGNOSIS — I10 Essential (primary) hypertension: Secondary | ICD-10-CM | POA: Diagnosis not present

## 2020-03-13 DIAGNOSIS — I611 Nontraumatic intracerebral hemorrhage in hemisphere, cortical: Secondary | ICD-10-CM | POA: Diagnosis not present

## 2020-03-13 DIAGNOSIS — J9601 Acute respiratory failure with hypoxia: Secondary | ICD-10-CM | POA: Diagnosis not present

## 2020-03-13 LAB — GLUCOSE, CAPILLARY
Glucose-Capillary: 146 mg/dL — ABNORMAL HIGH (ref 70–99)
Glucose-Capillary: 147 mg/dL — ABNORMAL HIGH (ref 70–99)
Glucose-Capillary: 76 mg/dL (ref 70–99)
Glucose-Capillary: 78 mg/dL (ref 70–99)
Glucose-Capillary: 88 mg/dL (ref 70–99)
Glucose-Capillary: 95 mg/dL (ref 70–99)

## 2020-03-13 MED ORDER — HYDRALAZINE HCL 25 MG PO TABS
25.0000 mg | ORAL_TABLET | Freq: Four times a day (QID) | ORAL | Status: DC
  Administered 2020-03-13 – 2020-04-24 (×124): 25 mg
  Filled 2020-03-13 (×136): qty 1

## 2020-03-13 MED ORDER — POLYETHYLENE GLYCOL 3350 17 G PO PACK
17.0000 g | PACK | Freq: Two times a day (BID) | ORAL | Status: DC
  Administered 2020-03-13 – 2020-05-02 (×65): 17 g
  Filled 2020-03-13 (×78): qty 1

## 2020-03-13 NOTE — Progress Notes (Signed)
PROGRESS NOTE  Joshua [name] ZOX:[id number] DOB: 1981/11/20 DOA: 09/09/2019 PCP: Patient, [name]  Brief History   39 year old African-American male admitted by neurosurgery on 09/09/2023 seizure, right hemiplegia and decreased responsiveness found to have left hemispheric intracranial hemorrhage, intubated admitted to ICU. Patient underwent craniotomy for decompression on 09/09/2019 by Dr. Vertell Limber, patient showed slow neurological recovery and prolonged hospitalization due to seizure and aspiration pneumonia. Patient continued to be awake but significant dysphagia, PEG tube was placed on 09/25/2019. Placement has been challenging as patient is uninsured and needs long-term care. Patient vomited tube feeding contents on the night of 01/06/2019. CT abdomen pelvis was negative for obstruction. Currently tolerating tube feeds well. Awaiting placement which is challenging due to the fact that he is uninsured and requires long-term placement.  Consultants  . Trauma . PCCM . Neurosurgery . neurology  Procedures  . Intubation . Extubation . Mechanical ventilation . Craniotomy . PEG tube placement . Tube feeds  Antibiotics   Anti-infectives (From admission, onward)   Start     Dose/Rate Route Frequency Ordered Stop   09/27/19 1130  cephALEXin (KEFLEX) 250 MG/5ML suspension 500 mg     500 mg Per Tube Every 12 hours 09/27/19 1052 09/29/19 2250   09/25/19 1445  ceFAZolin (ANCEF) IVPB 2g/100 mL premix  Status:  Discontinued     2 g 200 mL/hr over 30 Minutes Intravenous Every 12 hours 09/25/19 1417 09/27/19 1052   09/14/19 1000  Ampicillin-Sulbactam (UNASYN) 3 g in sodium chloride 0.9 % 100 mL IVPB     3 g 200 mL/hr over 30 Minutes Intravenous Every 8 hours 09/14/19 0922 09/17/19 1816   09/13/19 1100  vancomycin (VANCOCIN) IVPB 750 mg/150 ml premix  Status:  Discontinued     750 mg 150 mL/hr over 60 Minutes Intravenous Every 24 hours 09/13/19 0728 09/13/19 0838   09/12/19 1100  vancomycin  (VANCOCIN) IVPB 1000 mg/200 mL premix  Status:  Discontinued     1,000 mg 200 mL/hr over 60 Minutes Intravenous Every 24 hours 09/11/19 1034 09/13/19 0728   09/11/19 1130  piperacillin-tazobactam (ZOSYN) IVPB 3.375 g  Status:  Discontinued     3.375 g 12.5 mL/hr over 240 Minutes Intravenous Every 8 hours 09/11/19 1054 09/14/19 0845   09/11/19 1100  vancomycin (VANCOCIN) 1,500 mg in sodium chloride 0.9 % 500 mL IVPB     1,500 mg 250 mL/hr over 120 Minutes Intravenous  Once 09/11/19 1034 09/11/19 1900   09/11/19 1015  piperacillin-tazobactam (ZOSYN) IVPB 4.5 g  Status:  Discontinued     4.5 g 200 mL/hr over 30 Minutes Intravenous Every 8 hours 09/11/19 1010 09/11/19 1052   09/09/19 0845  ceFAZolin (ANCEF) IVPB 2g/100 mL premix     2 g 200 mL/hr over 30 Minutes Intravenous Every 8 hours 09/09/19 0842 09/09/19 1658     Subjective  The patient is lying in bed. He is not verbal. He refuses exam and all interventions.   Objective   Vitals:  Vitals:   03/13/20 1224 03/13/20 1736  BP: (!) 130/101 127/79  Pulse: 93 (!) 110  Resp: 18 20  Temp: 97.8 F (36.6 C) (!) 97.5 F (36.4 C)  SpO2: 100% 98%    Exam:  Constitutional:  Appears calm and comfortable Respiratory:  . Respiratory effort normal. No retractions or accessory muscle use Abdomen:  Abdomen appears normal;  Musculoskeletal:  . Digits/nails BUE: no clubbing, cyanosis, petechiae, infection . No edema apparent Skin:  . No rashes, lesions, ulcers visualized. Neurologic:  .  CN 2-12 intact . Sensation all 4 extremities intact . Patient is moving all extremities. Psychiatric:  . Mental status: Oppositional.  I have personally reviewed the following:   Today's Data  . Vitals. Refuses labs.  Scheduled Meds: . amLODipine  10 mg Per Tube Daily  . chlorhexidine  15 mL Mouth Rinse BID  . cholecalciferol  1,000 Units Oral Daily  . dextrose  1 Tube Oral Once  . dextrose  25 g Intravenous Once  . enoxaparin (LOVENOX)  injection  40 mg Subcutaneous Daily  . feeding supplement (JEVITY 1.2 CAL)  474 mL Per Tube 4x daily  . feeding supplement (PRO-STAT SUGAR FREE 64)  30 mL Per Tube QID  . folic acid  1 mg Per Tube Daily  . free water  200 mL Per Tube Q4H  . hydrALAZINE  25 mg Per Tube Q6H  . insulin aspart  0-15 Units Subcutaneous Q4H  . levETIRAcetam  500 mg Per Tube BID  . mouth rinse  15 mL Mouth Rinse q12n4p  . metoprolol tartrate  25 mg Per Tube BID  . multivitamin  15 mL Per Tube Daily  . polyethylene glycol  17 g Oral BID  . QUEtiapine  50 mg Per Tube QHS  . thiamine  100 mg Per Tube Daily   Continuous Infusions:  Principal Problem:   ICH (intracerebral hemorrhage) (HCC) Active Problems:   Subdural hematoma (HCC)   Intracerebral hemorrhage (HCC)   Cytotoxic brain edema (HCC)   Hemorrhagic stroke (HCC)   Acute respiratory failure ([place])   Essential hypertension   Severe protein-calorie malnutrition ([place])   CKD (chronic kidney disease), stage IV (HCC)   Seizure ([place])   AKI (acute kidney injury) ([place])   LOS: 186 days   A & P  Left intracranial hemorrhage: likely from uncontrolled hypertension, status post decompressive craniectomy and hematoma evacuation on 09/09/2019.  CT head showed no vascular malformation.  Dr. Joaquim Lai did not recommend replacement of skull bone with lack of progression. Patient remains aphasic with right hemiplegia and severe cognitive impairment.  Seizure disorder: Continue Keppra. No recent seizure activity.  Klebsiella UTI: Treatment with cefazolin completed.  Hypertension: Blood pressure is controlled, continue Norvasc, hydralazine, metoprolol.  Aspiration pneumonia: The patient has completed course of antibiotics.  Nutrition: Patient status post PEG tube placement, on tube feeding.  CKD stage III: Patient's creatinine was 1.23 as of 01/17/2020.  Pt is refusing further attempts to draw blood.  I have seen and examined this patient myself. I have spent 28  minutes in his evaluation and car.  DVT Prophylaxis: Lovenox CODE STATUS: Full Code Family Communication: None available Disposition: SNF. Pt homeless  Cailin Gebel, DO [facility] Direct contact: see [email or web address]  7PM-7AM contact night coverage as above 02/20/2020, 3:09 PM  LOS: 158 days

## 2020-03-14 DIAGNOSIS — I611 Nontraumatic intracerebral hemorrhage in hemisphere, cortical: Secondary | ICD-10-CM | POA: Diagnosis not present

## 2020-03-14 DIAGNOSIS — J9601 Acute respiratory failure with hypoxia: Secondary | ICD-10-CM | POA: Diagnosis not present

## 2020-03-14 DIAGNOSIS — I10 Essential (primary) hypertension: Secondary | ICD-10-CM | POA: Diagnosis not present

## 2020-03-14 DIAGNOSIS — N184 Chronic kidney disease, stage 4 (severe): Secondary | ICD-10-CM | POA: Diagnosis not present

## 2020-03-14 LAB — GLUCOSE, CAPILLARY
Glucose-Capillary: 100 mg/dL — ABNORMAL HIGH (ref 70–99)
Glucose-Capillary: 115 mg/dL — ABNORMAL HIGH (ref 70–99)
Glucose-Capillary: 117 mg/dL — ABNORMAL HIGH (ref 70–99)
Glucose-Capillary: 129 mg/dL — ABNORMAL HIGH (ref 70–99)
Glucose-Capillary: 138 mg/dL — ABNORMAL HIGH (ref 70–99)
Glucose-Capillary: 140 mg/dL — ABNORMAL HIGH (ref 70–99)
Glucose-Capillary: 92 mg/dL (ref 70–99)

## 2020-03-14 NOTE — TOC Progression Note (Signed)
Transition of Care [facility]) - Progression Note    Patient Details  Name: Joshua [name] MRN: [id number] Date of Birth: Nov 03, 1981  Transition of Care [facility]) CM/SW [place] Phone Number: 03/14/2020, 11:48 AM  Clinical Narrative:   CSW spoke with CSW Director about patient's case, and Director requested a summary email about patient's case and care needs to try to locate a difficult to place bed for the patient. CSW reviewed chart and summarized referral, sent Director an email. No bed offers at this time, CSW to follow.    Expected Discharge Plan: Skilled Nursing Facility Barriers to Discharge: SNF Pending payor source - [facility], SNF Pending bed offer, Inadequate or no insurance  Expected Discharge Plan and Services Expected Discharge Plan: [place] In-house Referral: Clinical Social Work Discharge Planning Services: NA Post Acute Care Choice: [place] Living arrangements for the past 2 months: Apartment                 DME Arranged: N/A DME Agency: NA       HH Arranged: NA HH Agency: NA         Social Determinants of Health (SDOH) Interventions    Readmission Risk Interventions No flowsheet data found.

## 2020-03-14 NOTE — Progress Notes (Signed)
PROGRESS NOTE  Joshua [name] QIH:[id number] DOB: 1981-02-01 DOA: 09/09/2019 PCP: Patient, [name]  Brief History   39 year old African-American male admitted by neurosurgery on 09/09/2023 seizure, right hemiplegia and decreased responsiveness found to have left hemispheric intracranial hemorrhage, intubated admitted to ICU. Patient underwent craniotomy for decompression on 09/09/2019 by Dr. Vertell Limber, patient showed slow neurological recovery and prolonged hospitalization due to seizure and aspiration pneumonia. Patient continued to be awake but significant dysphagia, PEG tube was placed on 09/25/2019. Placement has been challenging as patient is uninsured and needs long-term care. Patient vomited tube feeding contents on the night of 01/06/2019. CT abdomen pelvis was negative for obstruction. Currently tolerating tube feeds well. Awaiting placement which is challenging due to the fact that he is uninsured and requires long-term placement.  Consultants  . Trauma . PCCM . Neurosurgery . neurology  Procedures  . Intubation . Extubation . Mechanical ventilation . Craniotomy . PEG tube placement . Tube feeds  Antibiotics   Anti-infectives (From admission, onward)   Start     Dose/Rate Route Frequency Ordered Stop   09/27/19 1130  cephALEXin (KEFLEX) 250 MG/5ML suspension 500 mg     500 mg Per Tube Every 12 hours 09/27/19 1052 09/29/19 2250   09/25/19 1445  ceFAZolin (ANCEF) IVPB 2g/100 mL premix  Status:  Discontinued     2 g 200 mL/hr over 30 Minutes Intravenous Every 12 hours 09/25/19 1417 09/27/19 1052   09/14/19 1000  Ampicillin-Sulbactam (UNASYN) 3 g in sodium chloride 0.9 % 100 mL IVPB     3 g 200 mL/hr over 30 Minutes Intravenous Every 8 hours 09/14/19 0922 09/17/19 1816   09/13/19 1100  vancomycin (VANCOCIN) IVPB 750 mg/150 ml premix  Status:  Discontinued     750 mg 150 mL/hr over 60 Minutes Intravenous Every 24 hours 09/13/19 0728 09/13/19 0838   09/12/19 1100  vancomycin  (VANCOCIN) IVPB 1000 mg/200 mL premix  Status:  Discontinued     1,000 mg 200 mL/hr over 60 Minutes Intravenous Every 24 hours 09/11/19 1034 09/13/19 0728   09/11/19 1130  piperacillin-tazobactam (ZOSYN) IVPB 3.375 g  Status:  Discontinued     3.375 g 12.5 mL/hr over 240 Minutes Intravenous Every 8 hours 09/11/19 1054 09/14/19 0845   09/11/19 1100  vancomycin (VANCOCIN) 1,500 mg in sodium chloride 0.9 % 500 mL IVPB     1,500 mg 250 mL/hr over 120 Minutes Intravenous  Once 09/11/19 1034 09/11/19 1900   09/11/19 1015  piperacillin-tazobactam (ZOSYN) IVPB 4.5 g  Status:  Discontinued     4.5 g 200 mL/hr over 30 Minutes Intravenous Every 8 hours 09/11/19 1010 09/11/19 1052   09/09/19 0845  ceFAZolin (ANCEF) IVPB 2g/100 mL premix     2 g 200 mL/hr over 30 Minutes Intravenous Every 8 hours 09/09/19 0842 09/09/19 1658     Subjective  The patient is lying in bed. He is not verbal. He refuses exam and all interventions.   Objective   Vitals:  Vitals:   03/14/20 1148 03/14/20 1558  BP: 119/82 (!) 126/95  Pulse: (!) 103 90  Resp: 17 17  Temp: 98 F (36.7 C) 98.6 F (37 C)  SpO2: 99% 99%    Exam:  Constitutional:  Appears calm and comfortable Respiratory:  . Respiratory effort normal. No retractions or accessory muscle use Abdomen:  Abdomen appears normal;  Musculoskeletal:  . Digits/nails BUE: no clubbing, cyanosis, petechiae, infection . No edema apparent Skin:  . No rashes, lesions, ulcers visualized. Neurologic:  .  CN 2-12 intact . Sensation all 4 extremities intact . Patient is moving all extremities. Psychiatric:  . Mental status: Oppositional.  I have personally reviewed the following:   Today's Data  . Vitals. Glucoses. Refuses labs.  Scheduled Meds: . amLODipine  10 mg Per Tube Daily  . chlorhexidine  15 mL Mouth Rinse BID  . cholecalciferol  1,000 Units Oral Daily  . dextrose  1 Tube Oral Once  . dextrose  25 g Intravenous Once  . enoxaparin (LOVENOX)  injection  40 mg Subcutaneous Daily  . feeding supplement (JEVITY 1.2 CAL)  474 mL Per Tube 4x daily  . feeding supplement (PRO-STAT SUGAR FREE 64)  30 mL Per Tube QID  . folic acid  1 mg Per Tube Daily  . free water  200 mL Per Tube Q4H  . hydrALAZINE  25 mg Per Tube Q6H  . insulin aspart  0-15 Units Subcutaneous Q4H  . levETIRAcetam  500 mg Per Tube BID  . mouth rinse  15 mL Mouth Rinse q12n4p  . metoprolol tartrate  25 mg Per Tube BID  . multivitamin  15 mL Per Tube Daily  . polyethylene glycol  17 g Per Tube BID  . QUEtiapine  50 mg Per Tube QHS  . thiamine  100 mg Per Tube Daily   Continuous Infusions:  Principal Problem:   ICH (intracerebral hemorrhage) (HCC) Active Problems:   Subdural hematoma (HCC)   Intracerebral hemorrhage (HCC)   Cytotoxic brain edema (HCC)   Hemorrhagic stroke (HCC)   Acute respiratory failure ([place])   Essential hypertension   Severe protein-calorie malnutrition ([place])   CKD (chronic kidney disease), stage IV (HCC)   Seizure ([place])   AKI (acute kidney injury) ([place])   LOS: 187 days   A & P  Left intracranial hemorrhage: likely from uncontrolled hypertension, status post decompressive craniectomy and hematoma evacuation on 09/09/2019.  CT head showed no vascular malformation.  Dr. Joaquim Lai did not recommend replacement of skull bone with lack of progression. Patient remains aphasic with right hemiplegia and severe cognitive impairment.  Seizure disorder: Continue Keppra. No recent seizure activity.  Klebsiella UTI: Treatment with cefazolin completed.  Hypertension: Blood pressure is controlled, continue Norvasc, hydralazine, metoprolol.  Aspiration pneumonia: The patient has completed course of antibiotics.  Nutrition: Patient status post PEG tube placement, on tube feeding.  CKD stage III: Patient's creatinine was 1.23 as of 01/17/2020.  Pt is refusing further attempts to draw blood.  I have seen and examined this patient myself. I have spent 28  minutes in his evaluation and car.  DVT Prophylaxis: Lovenox CODE STATUS: Full Code Family Communication: None available Disposition: SNF. Pt homeless  Jousha Schwandt, DO [facility] Direct contact: see [email or web address]  7PM-7AM contact night coverage as above 03/14/2020, 4:42 PM  LOS: 158 days

## 2020-03-15 DIAGNOSIS — I611 Nontraumatic intracerebral hemorrhage in hemisphere, cortical: Secondary | ICD-10-CM | POA: Diagnosis not present

## 2020-03-15 DIAGNOSIS — J9601 Acute respiratory failure with hypoxia: Secondary | ICD-10-CM | POA: Diagnosis not present

## 2020-03-15 DIAGNOSIS — I10 Essential (primary) hypertension: Secondary | ICD-10-CM | POA: Diagnosis not present

## 2020-03-15 DIAGNOSIS — N184 Chronic kidney disease, stage 4 (severe): Secondary | ICD-10-CM | POA: Diagnosis not present

## 2020-03-15 LAB — GLUCOSE, CAPILLARY
Glucose-Capillary: 105 mg/dL — ABNORMAL HIGH (ref 70–99)
Glucose-Capillary: 111 mg/dL — ABNORMAL HIGH (ref 70–99)
Glucose-Capillary: 120 mg/dL — ABNORMAL HIGH (ref 70–99)
Glucose-Capillary: 146 mg/dL — ABNORMAL HIGH (ref 70–99)
Glucose-Capillary: 148 mg/dL — ABNORMAL HIGH (ref 70–99)
Glucose-Capillary: 93 mg/dL (ref 70–99)
Glucose-Capillary: 97 mg/dL (ref 70–99)
Glucose-Capillary: 98 mg/dL (ref 70–99)

## 2020-03-15 MED ORDER — VITAMIN D 25 MCG (1000 UNIT) PO TABS
1000.0000 [IU] | ORAL_TABLET | Freq: Every day | ORAL | Status: DC
  Administered 2020-03-16 – 2020-04-18 (×27): 1000 [IU]
  Filled 2020-03-15 (×32): qty 1

## 2020-03-15 NOTE — Progress Notes (Signed)
PROGRESS NOTE  Joshua [name] RWE:[id number] DOB: 14-Sep-1981 DOA: 09/09/2019 PCP: Patient, [name]  Brief History   39 year old African-American male admitted by neurosurgery on 09/09/2023 seizure, right hemiplegia and decreased responsiveness found to have left hemispheric intracranial hemorrhage, intubated admitted to ICU. Patient underwent craniotomy for decompression on 09/09/2019 by Dr. Vertell Limber, patient showed slow neurological recovery and prolonged hospitalization due to seizure and aspiration pneumonia. Patient continued to be awake but significant dysphagia, PEG tube was placed on 09/25/2019. Placement has been challenging as patient is uninsured and needs long-term care. Patient vomited tube feeding contents on the night of 01/06/2019. CT abdomen pelvis was negative for obstruction. Currently tolerating tube feeds well. Awaiting placement which is challenging due to the fact that he is uninsured and requires long-term placement.  Consultants  . Trauma . PCCM . Neurosurgery . neurology  Procedures  . Intubation . Extubation . Mechanical ventilation . Craniotomy . PEG tube placement . Tube feeds  Antibiotics   Anti-infectives (From admission, onward)   Start     Dose/Rate Route Frequency Ordered Stop   09/27/19 1130  cephALEXin (KEFLEX) 250 MG/5ML suspension 500 mg     500 mg Per Tube Every 12 hours 09/27/19 1052 09/29/19 2250   09/25/19 1445  ceFAZolin (ANCEF) IVPB 2g/100 mL premix  Status:  Discontinued     2 g 200 mL/hr over 30 Minutes Intravenous Every 12 hours 09/25/19 1417 09/27/19 1052   09/14/19 1000  Ampicillin-Sulbactam (UNASYN) 3 g in sodium chloride 0.9 % 100 mL IVPB     3 g 200 mL/hr over 30 Minutes Intravenous Every 8 hours 09/14/19 0922 09/17/19 1816   09/13/19 1100  vancomycin (VANCOCIN) IVPB 750 mg/150 ml premix  Status:  Discontinued     750 mg 150 mL/hr over 60 Minutes Intravenous Every 24 hours 09/13/19 0728 09/13/19 0838   09/12/19 1100  vancomycin  (VANCOCIN) IVPB 1000 mg/200 mL premix  Status:  Discontinued     1,000 mg 200 mL/hr over 60 Minutes Intravenous Every 24 hours 09/11/19 1034 09/13/19 0728   09/11/19 1130  piperacillin-tazobactam (ZOSYN) IVPB 3.375 g  Status:  Discontinued     3.375 g 12.5 mL/hr over 240 Minutes Intravenous Every 8 hours 09/11/19 1054 09/14/19 0845   09/11/19 1100  vancomycin (VANCOCIN) 1,500 mg in sodium chloride 0.9 % 500 mL IVPB     1,500 mg 250 mL/hr over 120 Minutes Intravenous  Once 09/11/19 1034 09/11/19 1900   09/11/19 1015  piperacillin-tazobactam (ZOSYN) IVPB 4.5 g  Status:  Discontinued     4.5 g 200 mL/hr over 30 Minutes Intravenous Every 8 hours 09/11/19 1010 09/11/19 1052   09/09/19 0845  ceFAZolin (ANCEF) IVPB 2g/100 mL premix     2 g 200 mL/hr over 30 Minutes Intravenous Every 8 hours 09/09/19 0842 09/09/19 1658     Subjective  The patient is lying in bed. He is beign cleaned by nursing. He is not verbal. He refuses exam and all interventions.   Objective   Vitals:  Vitals:   03/15/20 1126 03/15/20 1252  BP: (!) 126/96 124/90  Pulse: 89 97  Resp: 18 16  Temp: (!) 97.4 F (36.3 C) 98.7 F (37.1 C)  SpO2: 98% 99%    Exam:  Constitutional:  Appears calm and comfortable Respiratory:  . Respiratory effort normal. No retractions or accessory muscle use Abdomen:  Abdomen appears normal;  Musculoskeletal:  . Digits/nails BUE: no clubbing, cyanosis, petechiae, infection . No edema apparent Skin:  . No  rashes, lesions, ulcers visualized. Neurologic:  . CN 2-12 intact . Sensation all 4 extremities intact . Patient is moving all extremities. Psychiatric:  . Mental status: Oppositional.  I have personally reviewed the following:   Today's Data  . Vitals. Glucoses. Refuses labs.  Scheduled Meds: . amLODipine  10 mg Per Tube Daily  . chlorhexidine  15 mL Mouth Rinse BID  . [START ON 03/16/2020] cholecalciferol  1,000 Units Per Tube Daily  . dextrose  25 g Intravenous  Once  . enoxaparin (LOVENOX) injection  40 mg Subcutaneous Daily  . feeding supplement (JEVITY 1.2 CAL)  474 mL Per Tube 4x daily  . feeding supplement (PRO-STAT SUGAR FREE 64)  30 mL Per Tube QID  . folic acid  1 mg Per Tube Daily  . free water  200 mL Per Tube Q4H  . hydrALAZINE  25 mg Per Tube Q6H  . insulin aspart  0-15 Units Subcutaneous Q4H  . levETIRAcetam  500 mg Per Tube BID  . mouth rinse  15 mL Mouth Rinse q12n4p  . metoprolol tartrate  25 mg Per Tube BID  . multivitamin  15 mL Per Tube Daily  . polyethylene glycol  17 g Per Tube BID  . QUEtiapine  50 mg Per Tube QHS  . thiamine  100 mg Per Tube Daily   Continuous Infusions:  Principal Problem:   ICH (intracerebral hemorrhage) (HCC) Active Problems:   Subdural hematoma (HCC)   Intracerebral hemorrhage (HCC)   Cytotoxic brain edema (HCC)   Hemorrhagic stroke (HCC)   Acute respiratory failure ([place])   Essential hypertension   Severe protein-calorie malnutrition ([place])   CKD (chronic kidney disease), stage IV (HCC)   Seizure ([place])   AKI (acute kidney injury) ([place])   LOS: 188 days   A & P  Left intracranial hemorrhage: likely from uncontrolled hypertension, status post decompressive craniectomy and hematoma evacuation on 09/09/2019.  CT head showed no vascular malformation.  Dr. Joaquim Lai did not recommend replacement of skull bone with lack of progression. Patient remains aphasic with right hemiplegia and severe cognitive impairment.  Seizure disorder: Continue Keppra. No recent seizure activity.  Klebsiella UTI: Treatment with cefazolin completed.  Hypertension: Blood pressure is controlled, continue Norvasc, hydralazine, metoprolol.  Aspiration pneumonia: The patient has completed course of antibiotics.  Nutrition: Patient status post PEG tube placement, on tube feeding.  CKD stage III: Patient's creatinine was 1.23 as of 01/17/2020.  Pt is refusing further attempts to draw blood.  I have seen and examined this  patient myself. I have spent 28 minutes in his evaluation and car.  DVT Prophylaxis: Lovenox CODE STATUS: Full Code Family Communication: None available Disposition: SNF. Pt homeless  Janin Kozlowski, DO [facility] Direct contact: see [email or web address]  7PM-7AM contact night coverage as above 03/15/2020, 2:32 PM  LOS: 158 days

## 2020-03-16 DIAGNOSIS — I10 Essential (primary) hypertension: Secondary | ICD-10-CM | POA: Diagnosis not present

## 2020-03-16 DIAGNOSIS — N184 Chronic kidney disease, stage 4 (severe): Secondary | ICD-10-CM | POA: Diagnosis not present

## 2020-03-16 DIAGNOSIS — J9601 Acute respiratory failure with hypoxia: Secondary | ICD-10-CM | POA: Diagnosis not present

## 2020-03-16 DIAGNOSIS — I611 Nontraumatic intracerebral hemorrhage in hemisphere, cortical: Secondary | ICD-10-CM | POA: Diagnosis not present

## 2020-03-16 LAB — GLUCOSE, CAPILLARY: Glucose-Capillary: 137 mg/dL — ABNORMAL HIGH (ref 70–99)

## 2020-03-16 NOTE — Progress Notes (Signed)
PROGRESS NOTE  Bach Rocchi CBS:[id number] DOB: 01/31/1981 DOA: 09/09/2019 PCP: Patient, [name]  Brief History   39 year old African-American male admitted by neurosurgery on 09/09/2023 seizure, right hemiplegia and decreased responsiveness found to have left hemispheric intracranial hemorrhage, intubated admitted to ICU. Patient underwent craniotomy for decompression on 09/09/2019 by Dr. Vertell Limber, patient showed slow neurological recovery and prolonged hospitalization due to seizure and aspiration pneumonia. Patient continued to be awake but significant dysphagia, PEG tube was placed on 09/25/2019. Placement has been challenging as patient is uninsured and needs long-term care. Patient vomited tube feeding contents on the night of 01/06/2019. CT abdomen pelvis was negative for obstruction. Currently tolerating tube feeds well. Awaiting placement which is challenging due to the fact that he is uninsured and requires long-term placement.  Consultants  . Trauma . PCCM . Neurosurgery . neurology  Procedures  . Intubation . Extubation . Mechanical ventilation . Craniotomy . PEG tube placement . Tube feeds  Antibiotics   Anti-infectives (From admission, onward)   Start     Dose/Rate Route Frequency Ordered Stop   09/27/19 1130  cephALEXin (KEFLEX) 250 MG/5ML suspension 500 mg     500 mg Per Tube Every 12 hours 09/27/19 1052 09/29/19 2250   09/25/19 1445  ceFAZolin (ANCEF) IVPB 2g/100 mL premix  Status:  Discontinued     2 g 200 mL/hr over 30 Minutes Intravenous Every 12 hours 09/25/19 1417 09/27/19 1052   09/14/19 1000  Ampicillin-Sulbactam (UNASYN) 3 g in sodium chloride 0.9 % 100 mL IVPB     3 g 200 mL/hr over 30 Minutes Intravenous Every 8 hours 09/14/19 0922 09/17/19 1816   09/13/19 1100  vancomycin (VANCOCIN) IVPB 750 mg/150 ml premix  Status:  Discontinued     750 mg 150 mL/hr over 60 Minutes Intravenous Every 24 hours 09/13/19 0728 09/13/19 0838   09/12/19 1100  vancomycin  (VANCOCIN) IVPB 1000 mg/200 mL premix  Status:  Discontinued     1,000 mg 200 mL/hr over 60 Minutes Intravenous Every 24 hours 09/11/19 1034 09/13/19 0728   09/11/19 1130  piperacillin-tazobactam (ZOSYN) IVPB 3.375 g  Status:  Discontinued     3.375 g 12.5 mL/hr over 240 Minutes Intravenous Every 8 hours 09/11/19 1054 09/14/19 0845   09/11/19 1100  vancomycin (VANCOCIN) 1,500 mg in sodium chloride 0.9 % 500 mL IVPB     1,500 mg 250 mL/hr over 120 Minutes Intravenous  Once 09/11/19 1034 09/11/19 1900   09/11/19 1015  piperacillin-tazobactam (ZOSYN) IVPB 4.5 g  Status:  Discontinued     4.5 g 200 mL/hr over 30 Minutes Intravenous Every 8 hours 09/11/19 1010 09/11/19 1052   09/09/19 0845  ceFAZolin (ANCEF) IVPB 2g/100 mL premix     2 g 200 mL/hr over 30 Minutes Intravenous Every 8 hours 09/09/19 0842 09/09/19 1658     Subjective  The patient is lying in bed. He is beign cleaned by nursing. He is not verbal. He refuses exam and all interventions.   Objective   Vitals:  Vitals:   03/16/20 0811 03/16/20 1151  BP: 126/81 120/89  Pulse: 98 88  Resp: 20 20  Temp: 98.6 F (37 C) 98.2 F (36.8 C)  SpO2: 99% 100%    Exam:  Constitutional:  Appears calm and comfortable Respiratory:  . Respiratory effort normal. No retractions or accessory muscle use Abdomen:  Abdomen appears normal;  Musculoskeletal:  . Digits/nails BUE: no clubbing, cyanosis, petechiae, infection . No edema apparent Skin:  . No rashes, lesions,  ulcers visualized. Neurologic:  . CN 2-12 intact . Sensation all 4 extremities intact . Patient is moving all extremities. Psychiatric:  . Mental status: Oppositional.  I have personally reviewed the following:   Today's Data  . Vitals. Glucoses. Refuses labs.  Scheduled Meds: . amLODipine  10 mg Per Tube Daily  . chlorhexidine  15 mL Mouth Rinse BID  . cholecalciferol  1,000 Units Per Tube Daily  . dextrose  25 g Intravenous Once  . enoxaparin (LOVENOX)  injection  40 mg Subcutaneous Daily  . feeding supplement (JEVITY 1.2 CAL)  474 mL Per Tube 4x daily  . feeding supplement (PRO-STAT SUGAR FREE 64)  30 mL Per Tube QID  . folic acid  1 mg Per Tube Daily  . free water  200 mL Per Tube Q4H  . hydrALAZINE  25 mg Per Tube Q6H  . insulin aspart  0-15 Units Subcutaneous Q4H  . levETIRAcetam  500 mg Per Tube BID  . mouth rinse  15 mL Mouth Rinse q12n4p  . metoprolol tartrate  25 mg Per Tube BID  . multivitamin  15 mL Per Tube Daily  . polyethylene glycol  17 g Per Tube BID  . QUEtiapine  50 mg Per Tube QHS  . thiamine  100 mg Per Tube Daily   Continuous Infusions:  Principal Problem:   ICH (intracerebral hemorrhage) (HCC) Active Problems:   Subdural hematoma (HCC)   Intracerebral hemorrhage (HCC)   Cytotoxic brain edema (HCC)   Hemorrhagic stroke (HCC)   Acute respiratory failure ([place])   Essential hypertension   Severe protein-calorie malnutrition ([place])   CKD (chronic kidney disease), stage IV (HCC)   Seizure ([place])   AKI (acute kidney injury) ([place])   LOS: 189 days   A & P  Left intracranial hemorrhage: likely from uncontrolled hypertension, status post decompressive craniectomy and hematoma evacuation on 09/09/2019.  CT head showed no vascular malformation.  Dr. Joaquim Lai did not recommend replacement of skull bone with lack of progression. Patient remains aphasic with right hemiplegia and severe cognitive impairment.  Seizure disorder: Continue Keppra. No recent seizure activity.  Klebsiella UTI: Treatment with cefazolin completed.  Hypertension: Blood pressure is controlled, continue Norvasc, hydralazine, metoprolol.  Aspiration pneumonia: The patient has completed course of antibiotics.  Nutrition: Patient status post PEG tube placement, on tube feeding.  CKD stage III: Patient's creatinine was 1.23 as of 01/17/2020.  Pt is refusing further attempts to draw blood.  I have seen and examined this patient myself. I have spent 30  minutes in his evaluation and car.  DVT Prophylaxis: Lovenox CODE STATUS: Full Code Family Communication: None available Disposition: SNF. Pt homeless  Haydan Wedig, DO [facility] Direct contact: see [email or web address]  7PM-7AM contact night coverage as above 03/16/2020, 1:27 PM  LOS: 158 days

## 2020-03-17 ENCOUNTER — Inpatient Hospital Stay (HOSPITAL_COMMUNITY): Payer: Medicaid Other

## 2020-03-17 DIAGNOSIS — J9601 Acute respiratory failure with hypoxia: Secondary | ICD-10-CM | POA: Diagnosis not present

## 2020-03-17 DIAGNOSIS — N184 Chronic kidney disease, stage 4 (severe): Secondary | ICD-10-CM | POA: Diagnosis not present

## 2020-03-17 DIAGNOSIS — I10 Essential (primary) hypertension: Secondary | ICD-10-CM | POA: Diagnosis not present

## 2020-03-17 DIAGNOSIS — I611 Nontraumatic intracerebral hemorrhage in hemisphere, cortical: Secondary | ICD-10-CM | POA: Diagnosis not present

## 2020-03-17 NOTE — Progress Notes (Signed)
PROGRESS NOTE  Joshua [name] VVO:[id number] DOB: 11/18/1981 DOA: 09/09/2019 PCP: Patient, [name]  Brief History   39 year old African-American male admitted by neurosurgery on 09/09/2023 seizure, right hemiplegia and decreased responsiveness found to have left hemispheric intracranial hemorrhage, intubated admitted to ICU. Patient underwent craniotomy for decompression on 09/09/2019 by Dr. Vertell Limber, patient showed slow neurological recovery and prolonged hospitalization due to seizure and aspiration pneumonia. Patient continued to be awake but significant dysphagia, PEG tube was placed on 09/25/2019. Placement has been challenging as patient is uninsured and needs long-term care. Patient vomited tube feeding contents on the night of 01/06/2019. CT abdomen pelvis was negative for obstruction. Currently tolerating tube feeds well. Awaiting placement which is challenging due to the fact that he is uninsured and requires long-term placement.  Consultants  . Trauma . PCCM . Neurosurgery . neurology  Procedures  . Intubation . Extubation . Mechanical ventilation . Craniotomy . PEG tube placement . Tube feeds  Antibiotics   Anti-infectives (From admission, onward)   Start     Dose/Rate Route Frequency Ordered Stop   09/27/19 1130  cephALEXin (KEFLEX) 250 MG/5ML suspension 500 mg     500 mg Per Tube Every 12 hours 09/27/19 1052 09/29/19 2250   09/25/19 1445  ceFAZolin (ANCEF) IVPB 2g/100 mL premix  Status:  Discontinued     2 g 200 mL/hr over 30 Minutes Intravenous Every 12 hours 09/25/19 1417 09/27/19 1052   09/14/19 1000  Ampicillin-Sulbactam (UNASYN) 3 g in sodium chloride 0.9 % 100 mL IVPB     3 g 200 mL/hr over 30 Minutes Intravenous Every 8 hours 09/14/19 0922 09/17/19 1816   09/13/19 1100  vancomycin (VANCOCIN) IVPB 750 mg/150 ml premix  Status:  Discontinued     750 mg 150 mL/hr over 60 Minutes Intravenous Every 24 hours 09/13/19 0728 09/13/19 0838   09/12/19 1100  vancomycin  (VANCOCIN) IVPB 1000 mg/200 mL premix  Status:  Discontinued     1,000 mg 200 mL/hr over 60 Minutes Intravenous Every 24 hours 09/11/19 1034 09/13/19 0728   09/11/19 1130  piperacillin-tazobactam (ZOSYN) IVPB 3.375 g  Status:  Discontinued     3.375 g 12.5 mL/hr over 240 Minutes Intravenous Every 8 hours 09/11/19 1054 09/14/19 0845   09/11/19 1100  vancomycin (VANCOCIN) 1,500 mg in sodium chloride 0.9 % 500 mL IVPB     1,500 mg 250 mL/hr over 120 Minutes Intravenous  Once 09/11/19 1034 09/11/19 1900   09/11/19 1015  piperacillin-tazobactam (ZOSYN) IVPB 4.5 g  Status:  Discontinued     4.5 g 200 mL/hr over 30 Minutes Intravenous Every 8 hours 09/11/19 1010 09/11/19 1052   09/09/19 0845  ceFAZolin (ANCEF) IVPB 2g/100 mL premix     2 g 200 mL/hr over 30 Minutes Intravenous Every 8 hours 09/09/19 0842 09/09/19 1658     Subjective  The patient is lying in bed. He is not verbal. He refuses exam and all interventions. Nursing tells me that they are concerned that he has chest congestion.  Objective   Vitals:  Vitals:   03/17/20 0739 03/17/20 1205  BP: 120/90 103/73  Pulse: 96 98  Resp: 16 18  Temp: 98.1 F (36.7 C) 98.8 F (37.1 C)  SpO2: 100% 97%    Exam:  Constitutional:  Appears calm and comfortable Respiratory:  . Respiratory effort normal. No retractions or accessory muscle use Abdomen:  Abdomen appears normal;  Musculoskeletal:  . Digits/nails BUE: no clubbing, cyanosis, petechiae, infection . No edema apparent Skin:  .  No rashes, lesions, ulcers visualized. Neurologic:  . CN 2-12 intact . Sensation all 4 extremities intact . Patient is moving all extremities. Psychiatric:  . Mental status: Oppositional.  I have personally reviewed the following:   Today's Data  . Vitals. Glucoses. Refuses labs.  Scheduled Meds: . amLODipine  10 mg Per Tube Daily  . chlorhexidine  15 mL Mouth Rinse BID  . cholecalciferol  1,000 Units Per Tube Daily  . dextrose  25 g  Intravenous Once  . enoxaparin (LOVENOX) injection  40 mg Subcutaneous Daily  . feeding supplement (JEVITY 1.2 CAL)  474 mL Per Tube 4x daily  . feeding supplement (PRO-STAT SUGAR FREE 64)  30 mL Per Tube QID  . folic acid  1 mg Per Tube Daily  . free water  200 mL Per Tube Q4H  . hydrALAZINE  25 mg Per Tube Q6H  . insulin aspart  0-15 Units Subcutaneous Q4H  . levETIRAcetam  500 mg Per Tube BID  . mouth rinse  15 mL Mouth Rinse q12n4p  . metoprolol tartrate  25 mg Per Tube BID  . multivitamin  15 mL Per Tube Daily  . polyethylene glycol  17 g Per Tube BID  . QUEtiapine  50 mg Per Tube QHS  . thiamine  100 mg Per Tube Daily   Continuous Infusions:  Principal Problem:   ICH (intracerebral hemorrhage) (HCC) Active Problems:   Subdural hematoma (HCC)   Intracerebral hemorrhage (HCC)   Cytotoxic brain edema (HCC)   Hemorrhagic stroke (HCC)   Acute respiratory failure ([place])   Essential hypertension   Severe protein-calorie malnutrition ([place])   CKD (chronic kidney disease), stage IV (HCC)   Seizure ([place])   AKI (acute kidney injury) ([place])   LOS: 190 days   A & P  Left intracranial hemorrhage: likely from uncontrolled hypertension, status post decompressive craniectomy and hematoma evacuation on 09/09/2019.  CT head showed no vascular malformation.  Dr. Joaquim Lai did not recommend replacement of skull bone with lack of progression. Patient remains aphasic with right hemiplegia and severe cognitive impairment.  Seizure disorder: Continue Keppra. No recent seizure activity.  Klebsiella UTI: Treatment with cefazolin completed.  Hypertension: Blood pressure is controlled, continue Norvasc, hydralazine, metoprolol.  Chest Congestion: As per observation from nursing. Will order CXR, although it is unclear that patient will allow it. Likewise I will order labs to evaluate for indications of infection. Doubt that patient will allow this. Will start Guaifenesin in the meantime. He does not  appear to have an acute illness.  Aspiration pneumonia: The patient has completed course of antibiotics.  Nutrition: Patient status post PEG tube placement, on tube feeding.  CKD stage III: Patient's creatinine was 1.23 as of 01/17/2020.  Pt is refusing further attempts to draw blood.  I have seen and examined this patient myself. I have spent 34 minutes in his evaluation and car.  DVT Prophylaxis: Lovenox CODE STATUS: Full Code Family Communication: None available Disposition: SNF. Pt homeless  Evoleht Hovatter, DO [facility] Direct contact: see [email or web address]  7PM-7AM contact night coverage as above 03/17/2020, 3:07 PM  LOS: 158 days

## 2020-03-18 DIAGNOSIS — N184 Chronic kidney disease, stage 4 (severe): Secondary | ICD-10-CM | POA: Diagnosis not present

## 2020-03-18 DIAGNOSIS — I611 Nontraumatic intracerebral hemorrhage in hemisphere, cortical: Secondary | ICD-10-CM | POA: Diagnosis not present

## 2020-03-18 DIAGNOSIS — I10 Essential (primary) hypertension: Secondary | ICD-10-CM | POA: Diagnosis not present

## 2020-03-18 DIAGNOSIS — J9601 Acute respiratory failure with hypoxia: Secondary | ICD-10-CM | POA: Diagnosis not present

## 2020-03-18 LAB — GLUCOSE, CAPILLARY
Glucose-Capillary: 103 mg/dL — ABNORMAL HIGH (ref 70–99)
Glucose-Capillary: 108 mg/dL — ABNORMAL HIGH (ref 70–99)
Glucose-Capillary: 110 mg/dL — ABNORMAL HIGH (ref 70–99)
Glucose-Capillary: 115 mg/dL — ABNORMAL HIGH (ref 70–99)
Glucose-Capillary: 124 mg/dL — ABNORMAL HIGH (ref 70–99)
Glucose-Capillary: 127 mg/dL — ABNORMAL HIGH (ref 70–99)
Glucose-Capillary: 137 mg/dL — ABNORMAL HIGH (ref 70–99)
Glucose-Capillary: 141 mg/dL — ABNORMAL HIGH (ref 70–99)
Glucose-Capillary: 169 mg/dL — ABNORMAL HIGH (ref 70–99)
Glucose-Capillary: 169 mg/dL — ABNORMAL HIGH (ref 70–99)
Glucose-Capillary: 87 mg/dL (ref 70–99)
Glucose-Capillary: 91 mg/dL (ref 70–99)
Glucose-Capillary: 91 mg/dL (ref 70–99)
Glucose-Capillary: 96 mg/dL (ref 70–99)
Glucose-Capillary: 96 mg/dL (ref 70–99)
Glucose-Capillary: 109 mg/dL — ABNORMAL HIGH (ref 70–99)
Glucose-Capillary: 116 mg/dL — ABNORMAL HIGH (ref 70–99)
Glucose-Capillary: 147 mg/dL — ABNORMAL HIGH (ref 70–99)
Glucose-Capillary: 142 mg/dL — ABNORMAL HIGH (ref 70–99)

## 2020-03-18 NOTE — Progress Notes (Signed)
PROGRESS NOTE  Joshua [name] WGY:[id number] DOB: [birth year] DOA: 09/09/2019 PCP: Patient, [name]  Brief History   39 year old African-American male admitted by neurosurgery on 09/09/2023 seizure, right hemiplegia and decreased responsiveness found to have left hemispheric intracranial hemorrhage, intubated admitted to ICU. Patient underwent craniotomy for decompression on 09/09/2019 by Dr. Vertell Limber, patient showed slow neurological recovery and prolonged hospitalization due to seizure and aspiration pneumonia. Patient continued to be awake but significant dysphagia, PEG tube was placed on 09/25/2019. Placement has been challenging as patient is uninsured and needs long-term care. Patient vomited tube feeding contents on the night of 01/06/2019. CT abdomen pelvis was negative for obstruction. Currently tolerating tube feeds well. Awaiting placement which is challenging due to the fact that he is uninsured and requires long-term placement.  On 03/17/2020 nursing advised me that they felt that the patient had some chest congestion, although they observed no respiratory distress, and his oxygen saturations were stable. I ordered a portable chest x-ray. However, the patient became combative, so it was cancelled.   Consultants  . Trauma . PCCM . Neurosurgery . neurology  Procedures  . Intubation . Extubation . Mechanical ventilation . Craniotomy . PEG tube placement . Tube feeds  Antibiotics   Anti-infectives (From admission, onward)   Start     Dose/Rate Route Frequency Ordered Stop   09/27/19 1130  cephALEXin (KEFLEX) 250 MG/5ML suspension 500 mg     500 mg Per Tube Every 12 hours 09/27/19 1052 09/29/19 2250   09/25/19 1445  ceFAZolin (ANCEF) IVPB 2g/100 mL premix  Status:  Discontinued     2 g 200 mL/hr over 30 Minutes Intravenous Every 12 hours 09/25/19 1417 09/27/19 1052   09/14/19 1000  Ampicillin-Sulbactam (UNASYN) 3 g in sodium chloride 0.9 % 100 mL IVPB     3 g 200 mL/hr over 30  Minutes Intravenous Every 8 hours 09/14/19 0922 09/17/19 1816   09/13/19 1100  vancomycin (VANCOCIN) IVPB 750 mg/150 ml premix  Status:  Discontinued     750 mg 150 mL/hr over 60 Minutes Intravenous Every 24 hours 09/13/19 0728 09/13/19 0838   09/12/19 1100  vancomycin (VANCOCIN) IVPB 1000 mg/200 mL premix  Status:  Discontinued     1,000 mg 200 mL/hr over 60 Minutes Intravenous Every 24 hours 09/11/19 1034 09/13/19 0728   09/11/19 1130  piperacillin-tazobactam (ZOSYN) IVPB 3.375 g  Status:  Discontinued     3.375 g 12.5 mL/hr over 240 Minutes Intravenous Every 8 hours 09/11/19 1054 09/14/19 0845   09/11/19 1100  vancomycin (VANCOCIN) 1,500 mg in sodium chloride 0.9 % 500 mL IVPB     1,500 mg 250 mL/hr over 120 Minutes Intravenous  Once 09/11/19 1034 09/11/19 1900   09/11/19 1015  piperacillin-tazobactam (ZOSYN) IVPB 4.5 g  Status:  Discontinued     4.5 g 200 mL/hr over 30 Minutes Intravenous Every 8 hours 09/11/19 1010 09/11/19 1052   09/09/19 0845  ceFAZolin (ANCEF) IVPB 2g/100 mL premix     2 g 200 mL/hr over 30 Minutes Intravenous Every 8 hours 09/09/19 0842 09/09/19 1658     Subjective  The patient is lying in bed. He is not verbal. He refuses exam and all interventions. No new complaints.  Objective   Vitals:  Vitals:   03/18/20 0752 03/18/20 1229  BP: (!) 128/97 (!) 134/92  Pulse: 93 86  Resp: 18 18  Temp: 97.7 F (36.5 C) 97.9 F (36.6 C)  SpO2: 100% 100%    Exam:  Constitutional:  Appears calm and comfortable Respiratory:  . Respiratory effort normal. No retractions or accessory muscle use Abdomen:  Abdomen appears normal;  Musculoskeletal:  . Digits/nails BUE: no clubbing, cyanosis, petechiae, infection . No edema apparent Skin:  . No rashes, lesions, ulcers visualized. Neurologic:  . CN 2-12 intact . Sensation all 4 extremities intact . Patient is moving all extremities. Psychiatric:  . Mental status: Oppositional.  I have personally reviewed the  following:   Today's Data  . Vitals. Glucoses. Refuses labs or x-rays.  Scheduled Meds: . amLODipine  10 mg Per Tube Daily  . chlorhexidine  15 mL Mouth Rinse BID  . cholecalciferol  1,000 Units Per Tube Daily  . dextrose  25 g Intravenous Once  . enoxaparin (LOVENOX) injection  40 mg Subcutaneous Daily  . feeding supplement (JEVITY 1.2 CAL)  474 mL Per Tube 4x daily  . feeding supplement (PRO-STAT SUGAR FREE 64)  30 mL Per Tube QID  . folic acid  1 mg Per Tube Daily  . free water  200 mL Per Tube Q4H  . hydrALAZINE  25 mg Per Tube Q6H  . insulin aspart  0-15 Units Subcutaneous Q4H  . levETIRAcetam  500 mg Per Tube BID  . mouth rinse  15 mL Mouth Rinse q12n4p  . metoprolol tartrate  25 mg Per Tube BID  . multivitamin  15 mL Per Tube Daily  . polyethylene glycol  17 g Per Tube BID  . QUEtiapine  50 mg Per Tube QHS  . thiamine  100 mg Per Tube Daily   Continuous Infusions:  Principal Problem:   ICH (intracerebral hemorrhage) (HCC) Active Problems:   Subdural hematoma (HCC)   Intracerebral hemorrhage (HCC)   Cytotoxic brain edema (HCC)   Hemorrhagic stroke (HCC)   Acute respiratory failure ([place])   Essential hypertension   Severe protein-calorie malnutrition ([place])   CKD (chronic kidney disease), stage IV (HCC)   Seizure ([place])   AKI (acute kidney injury) ([place])   LOS: 191 days   A & P  Left intracranial hemorrhage: likely from uncontrolled hypertension, status post decompressive craniectomy and hematoma evacuation on 09/09/2019.  CT head showed no vascular malformation.  Dr. Joaquim Lai did not recommend replacement of skull bone with lack of progression. Patient remains aphasic with right hemiplegia and severe cognitive impairment.  Seizure disorder: Continue Keppra. No recent seizure activity.  Klebsiella UTI: Treatment with cefazolin completed.  Hypertension: Blood pressure is controlled, continue Norvasc, hydralazine, metoprolol.  Chest Congestion: As per observation from  nursing.Pt became combative with attempts to obtain portable CXR. Will start Guaifenesin as needed in the meantime. He does not appear to have an acute illness.  Aspiration pneumonia: The patient has completed course of antibiotics.  Nutrition: Patient status post PEG tube placement, on tube feeding.  CKD stage III: Patient's creatinine was 1.23 as of 01/17/2020.  Pt is refusing further attempts to draw blood.  I have seen and examined this patient myself. I have spent 30 minutes in his evaluation and car.  DVT Prophylaxis: Lovenox CODE STATUS: Full Code Family Communication: None available Disposition: SNF. Pt homeless  Riannah Stagner, DO [facility] Direct contact: see [email or web address]  7PM-7AM contact night coverage as above 03/18/2020, 3:26 PM  LOS: 158 days

## 2020-03-19 DIAGNOSIS — N184 Chronic kidney disease, stage 4 (severe): Secondary | ICD-10-CM | POA: Diagnosis not present

## 2020-03-19 DIAGNOSIS — I10 Essential (primary) hypertension: Secondary | ICD-10-CM | POA: Diagnosis not present

## 2020-03-19 DIAGNOSIS — J9601 Acute respiratory failure with hypoxia: Secondary | ICD-10-CM | POA: Diagnosis not present

## 2020-03-19 DIAGNOSIS — I611 Nontraumatic intracerebral hemorrhage in hemisphere, cortical: Secondary | ICD-10-CM | POA: Diagnosis not present

## 2020-03-19 LAB — GLUCOSE, CAPILLARY
Glucose-Capillary: 103 mg/dL — ABNORMAL HIGH (ref 70–99)
Glucose-Capillary: 109 mg/dL — ABNORMAL HIGH (ref 70–99)
Glucose-Capillary: 148 mg/dL — ABNORMAL HIGH (ref 70–99)
Glucose-Capillary: 94 mg/dL (ref 70–99)
Glucose-Capillary: 95 mg/dL (ref 70–99)
Glucose-Capillary: 99 mg/dL (ref 70–99)

## 2020-03-19 NOTE — Progress Notes (Addendum)
PROGRESS NOTE  Kym Fenter VHQ:[id number] DOB: 1981/06/23 DOA: 09/09/2019 PCP: Patient, [name]  Brief History   39 year old African-American male admitted by neurosurgery on 09/09/2023 seizure, right hemiplegia and decreased responsiveness found to have left hemispheric intracranial hemorrhage, intubated admitted to ICU. Patient underwent craniotomy for decompression on 09/09/2019 by Dr. Vertell Limber, patient showed slow neurological recovery and prolonged hospitalization due to seizure and aspiration pneumonia. Patient continued to be awake but significant dysphagia, PEG tube was placed on 09/25/2019. Placement has been challenging as patient is uninsured and needs long-term care. Patient vomited tube feeding contents on the night of 01/06/2019. CT abdomen pelvis was negative for obstruction. Currently tolerating tube feeds well. Awaiting placement which is challenging due to the fact that he is uninsured and requires long-term placement.  On 03/17/2020 nursing advised me that they felt that the patient had some chest congestion, although they observed no respiratory distress, and his oxygen saturations were stable. I ordered a portable chest x-ray. However, the patient became combative, so it was cancelled.   On 03/19/2020 the patient's PEG tube sustained damage externally. Nursing has confirmed that it is still functional and can be clamped and flushed without complication. It is still in place.  Consultants  . Trauma . PCCM . Neurosurgery . neurology  Procedures  . Intubation . Extubation . Mechanical ventilation . Craniotomy . PEG tube placement . Tube feeds  Antibiotics   Anti-infectives (From admission, onward)   Start     Dose/Rate Route Frequency Ordered Stop   09/27/19 1130  cephALEXin (KEFLEX) 250 MG/5ML suspension 500 mg     500 mg Per Tube Every 12 hours 09/27/19 1052 09/29/19 2250   09/25/19 1445  ceFAZolin (ANCEF) IVPB 2g/100 mL premix  Status:  Discontinued     2 g 200  mL/hr over 30 Minutes Intravenous Every 12 hours 09/25/19 1417 09/27/19 1052   09/14/19 1000  Ampicillin-Sulbactam (UNASYN) 3 g in sodium chloride 0.9 % 100 mL IVPB     3 g 200 mL/hr over 30 Minutes Intravenous Every 8 hours 09/14/19 0922 09/17/19 1816   09/13/19 1100  vancomycin (VANCOCIN) IVPB 750 mg/150 ml premix  Status:  Discontinued     750 mg 150 mL/hr over 60 Minutes Intravenous Every 24 hours 09/13/19 0728 09/13/19 0838   09/12/19 1100  vancomycin (VANCOCIN) IVPB 1000 mg/200 mL premix  Status:  Discontinued     1,000 mg 200 mL/hr over 60 Minutes Intravenous Every 24 hours 09/11/19 1034 09/13/19 0728   09/11/19 1130  piperacillin-tazobactam (ZOSYN) IVPB 3.375 g  Status:  Discontinued     3.375 g 12.5 mL/hr over 240 Minutes Intravenous Every 8 hours 09/11/19 1054 09/14/19 0845   09/11/19 1100  vancomycin (VANCOCIN) 1,500 mg in sodium chloride 0.9 % 500 mL IVPB     1,500 mg 250 mL/hr over 120 Minutes Intravenous  Once 09/11/19 1034 09/11/19 1900   09/11/19 1015  piperacillin-tazobactam (ZOSYN) IVPB 4.5 g  Status:  Discontinued     4.5 g 200 mL/hr over 30 Minutes Intravenous Every 8 hours 09/11/19 1010 09/11/19 1052   09/09/19 0845  ceFAZolin (ANCEF) IVPB 2g/100 mL premix     2 g 200 mL/hr over 30 Minutes Intravenous Every 8 hours 09/09/19 0842 09/09/19 1658     Subjective  The patient is lying in bed. He is not verbal. He refuses exam and all interventions. No new complaints.  Objective   Vitals:  Vitals:   03/19/20 1202 03/19/20 1542  BP: 111/78 122/84  Pulse: (!) 107 97  Resp: 18 18  Temp: 97.8 F (36.6 C) 98.2 F (36.8 C)  SpO2: 98% 98%    Exam:  Constitutional:  Appears calm and comfortable Respiratory:  . Respiratory effort normal. No retractions or accessory muscle use Abdomen:  Abdomen appears normal;  Musculoskeletal:  . Digits/nails BUE: no clubbing, cyanosis, petechiae, infection . No edema apparent Skin:  . No rashes, lesions, ulcers  visualized. Neurologic:  . CN 2-12 intact . Sensation all 4 extremities intact . Patient is moving all extremities. Psychiatric:  . Mental status: Oppositional.  I have personally reviewed the following:   Today's Data  . Vitals. Glucoses. Refuses labs or x-rays.  Scheduled Meds: . amLODipine  10 mg Per Tube Daily  . chlorhexidine  15 mL Mouth Rinse BID  . cholecalciferol  1,000 Units Per Tube Daily  . dextrose  25 g Intravenous Once  . enoxaparin (LOVENOX) injection  40 mg Subcutaneous Daily  . feeding supplement (JEVITY 1.2 CAL)  474 mL Per Tube 4x daily  . feeding supplement (PRO-STAT SUGAR FREE 64)  30 mL Per Tube QID  . folic acid  1 mg Per Tube Daily  . free water  200 mL Per Tube Q4H  . hydrALAZINE  25 mg Per Tube Q6H  . insulin aspart  0-15 Units Subcutaneous Q4H  . levETIRAcetam  500 mg Per Tube BID  . mouth rinse  15 mL Mouth Rinse q12n4p  . metoprolol tartrate  25 mg Per Tube BID  . multivitamin  15 mL Per Tube Daily  . polyethylene glycol  17 g Per Tube BID  . QUEtiapine  50 mg Per Tube QHS  . thiamine  100 mg Per Tube Daily   Continuous Infusions:  Principal Problem:   ICH (intracerebral hemorrhage) (HCC) Active Problems:   Subdural hematoma (HCC)   Intracerebral hemorrhage (HCC)   Cytotoxic brain edema (HCC)   Hemorrhagic stroke (HCC)   Acute respiratory failure ([place])   Essential hypertension   Severe protein-calorie malnutrition ([place])   CKD (chronic kidney disease), stage IV (HCC)   Seizure ([place])   AKI (acute kidney injury) ([place])   LOS: 192 days   A & P  Left intracranial hemorrhage: likely from uncontrolled hypertension, status post decompressive craniectomy and hematoma evacuation on 09/09/2019.  CT head showed no vascular malformation.  Dr. Joaquim Lai did not recommend replacement of skull bone with lack of progression. Patient remains aphasic with right hemiplegia and severe cognitive impairment.  Seizure disorder: Continue Keppra. No recent seizure  activity.  Klebsiella UTI: Treatment with cefazolin completed.  Hypertension: Blood pressure is controlled, continue Norvasc, hydralazine, metoprolol.  Chest Congestion: As per observation from nursing.Pt became combative with attempts to obtain portable CXR. Will start Guaifenesin as needed in the meantime. He does not appear to have an acute illness.  Aspiration pneumonia: The patient has completed course of antibiotics.  Nutrition: Patient status post PEG tube placement, on tube feeding. On 03/19/2020 the patient's PEG tube sustained damage externally. Nursing has confirmed that it is still functional and can be clamped and flushed without complication. It is still in place.  CKD stage III: Patient's creatinine was 1.23 as of 01/17/2020.  Pt is refusing further attempts to draw blood.  I have seen and examined this patient myself. I have spent 32 minutes in his evaluation and care.  DVT Prophylaxis: Lovenox CODE STATUS: Full Code Family Communication: None available Disposition: SNF. Pt homeless. Barrier to discharge: need for long term care  eventually, and no funding source. The patient is medically cleared for discharge. No safe discharge.  Rishita Petron, DO [facility] Direct contact: see [email or web address]  7PM-7AM contact night coverage as above 03/19/2020, 4:46 PM  LOS: 158 days

## 2020-03-19 NOTE — Progress Notes (Signed)
External damage noted to PEG tube. MD notified via pager. PEG still able to be clamped and flushed without complication. PEG tube still in place. Will update with MD recommendation if any is given.

## 2020-03-19 NOTE — Progress Notes (Signed)
Nutrition Follow-up  DOCUMENTATION CODES:   Severe malnutrition in context of acute illness/injury  INTERVENTION:  ContinueJevity 1.2 formulavia PEG at goal volume of 474 ml (2 cartons/ARCs) given QID.   Provide 30 ml ProstatQID per tube.   Free water flushes of200 ml every 4 hours per tube. (MD to adjust as appropriate)  Tube feeding to provide 2675 kcal, 165grams of protein, and272m free water.  Continue PO intake for comfort as appropriate/tolerated.   NUTRITION DIAGNOSIS:   Severe Malnutrition related to acute illness(intracerebral hemorrage) as evidenced by severe fat depletion, severe muscle depletion.  Ongoing.  GOAL:   Patient will meet greater than or equal to 90% of their needs  Met with TF  MONITOR:   TF tolerance, Skin, Weight trends, Labs, I & O's  REASON FOR ASSESSMENT:   Ventilator, Consult Enteral/tube feeding initiation and management  ASSESSMENT:   39yo male admitted with large left intracranial hemorrhage; S/P craniotomy. PMH includes HTN, alcoholism, noncompliance.  9/12left frontal temporal craniectomy and evacuation hematoma.  9/23 Extubated.  10/1 PEG placed 11/23- Nutrition Focused Physical Exam performed, severe subcutaneous fat loss and severe muscle wasting identified.   Discussed pt with RN.  Per MD, placement continues to be challenging as pt is uninsured and needs long-term placement. Pt is nonverbal and refusing exams.   Pt noted to have accepted 0% of meals over the last week.  Admission weight: 63 kg Current weight: 62 kg  Medications reviewed and include:Vitamin D3, 4747mJevity 1.2 cal QID, 3068mro-stat QID, Folvite, 200m68mee water Q4, SSI, MVI, Vitamin B1, Miralax  No labs completed since 1/20.  CBGs 116-147  Diet Order:   Diet Order            Diet regular Room service appropriate? Yes; Fluid consistency: Thin  Diet effective now              EDUCATION NEEDS:   Not appropriate for  education at this time  Skin:  Skin Assessment: Reviewed RN Assessment Skin Integrity Issues:: Other (Comment) Other: skin tear right hip  Last BM:  03/18/20  Height:   Ht Readings from Last 1 Encounters:  09/09/19 _0  (1.753 m)    Weight:   Wt Readings from Last 1 Encounters:  03/17/20 62 kg    BMI:  Body mass index is 20.18 kg/m.  Estimated Nutritional Needs:   Kcal:  [id number]:  130-170 grams  Fluid:  >/= 2 L/day   AmanLarkin Ina, RD, LDN RD pager number and weekend/on-call pager number located in [place]

## 2020-03-20 DIAGNOSIS — I611 Nontraumatic intracerebral hemorrhage in hemisphere, cortical: Secondary | ICD-10-CM | POA: Diagnosis not present

## 2020-03-20 LAB — GLUCOSE, CAPILLARY
Glucose-Capillary: 116 mg/dL — ABNORMAL HIGH (ref 70–99)
Glucose-Capillary: 158 mg/dL — ABNORMAL HIGH (ref 70–99)
Glucose-Capillary: 87 mg/dL (ref 70–99)
Glucose-Capillary: 93 mg/dL (ref 70–99)
Glucose-Capillary: 94 mg/dL (ref 70–99)
Glucose-Capillary: 98 mg/dL (ref 70–99)

## 2020-03-20 NOTE — Progress Notes (Signed)
PROGRESS NOTE    Joshua [name]  SAY:[id number] DOB: 1981/02/24 DOA: 09/09/2019 PCP: Patient, [name]  Brief Narrative:  39 year old gentleman with prior history of seizures admitted for decreased responsiveness and seizures was found to have left hemispheric intracranial hemorrhage patient was intubated and admitted to ICU.  Subsequently he underwent craniotomy for decompression by Dr. Vertell Limber.  Patient had slow neurological recovery and prolonged hospitalization due to seizures and complicated by aspiration pneumonia.  Patient is alert and awake but had significant dysphagia at which time PEG tube was placed on September 25, 2019.  Placement has been challenging as patient does not have insurance and needs long-term care.  Currently waiting for TOC and placement. Assessment & Plan:   Principal Problem:   ICH (intracerebral hemorrhage) (HCC) Active Problems:   Subdural hematoma (HCC)   Intracerebral hemorrhage (HCC)   Cytotoxic brain edema (HCC)   Hemorrhagic stroke (HCC)   Acute respiratory failure ([place])   Essential hypertension   Severe protein-calorie malnutrition (HCC)   CKD (chronic kidney disease), stage IV (HCC)   Seizure (HCC)   AKI (acute kidney injury) ([place])   Left intracranial hemorrhage Probably secondary to uncontrolled hypertension, s/p decompressive craniotomy and hematoma evacuation on 09/09/2019 by Dr. Vertell Limber. Patient remains aphasic with right hemiplegia and severe cognitive impairment    History of seizure disorder Continue with Keppra.  No seizure activity overnight.   Klebsiella UTI Completed the course of cefazolin.  Aspiration pneumonia S/p completion of empiric antibiotics   Stage III CKD Creatinine appears to be at baseline   Nutrition S/p PEG tube placement.   Essential hypertension Blood pressure well controlled at this time continue with Norvasc and metoprolol.  And hydralazine   DVT prophylaxis: Lovenox Code Status: Full code Family  Communication: None at bedside  disposition Plan:  . Patient is homeless has severe cognitive impairment needs long-term care patient is not stable for discharge home, TOC consulted for placement and is pending.   Consultants:   Trauma  PCCM  Neurosurgery  neurology   Procedures:   Intubation  Extubation  Mechanical ventilation  Craniotomy  PEG tube placement  Tube feeds   antimicrobials:  Anti-infectives (From admission, onward)   Start     Dose/Rate Route Frequency Ordered Stop   09/27/19 1130  cephALEXin (KEFLEX) 250 MG/5ML suspension 500 mg     500 mg Per Tube Every 12 hours 09/27/19 1052 09/29/19 2250   09/25/19 1445  ceFAZolin (ANCEF) IVPB 2g/100 mL premix  Status:  Discontinued     2 g 200 mL/hr over 30 Minutes Intravenous Every 12 hours 09/25/19 1417 09/27/19 1052   09/14/19 1000  Ampicillin-Sulbactam (UNASYN) 3 g in sodium chloride 0.9 % 100 mL IVPB     3 g 200 mL/hr over 30 Minutes Intravenous Every 8 hours 09/14/19 0922 09/17/19 1816   09/13/19 1100  vancomycin (VANCOCIN) IVPB 750 mg/150 ml premix  Status:  Discontinued     750 mg 150 mL/hr over 60 Minutes Intravenous Every 24 hours 09/13/19 0728 09/13/19 0838   09/12/19 1100  vancomycin (VANCOCIN) IVPB 1000 mg/200 mL premix  Status:  Discontinued     1,000 mg 200 mL/hr over 60 Minutes Intravenous Every 24 hours 09/11/19 1034 09/13/19 0728   09/11/19 1130  piperacillin-tazobactam (ZOSYN) IVPB 3.375 g  Status:  Discontinued     3.375 g 12.5 mL/hr over 240 Minutes Intravenous Every 8 hours 09/11/19 1054 09/14/19 0845   09/11/19 1100  vancomycin (VANCOCIN) 1,500 mg in sodium chloride  0.9 % 500 mL IVPB     1,500 mg 250 mL/hr over 120 Minutes Intravenous  Once 09/11/19 1034 09/11/19 1900   09/11/19 1015  piperacillin-tazobactam (ZOSYN) IVPB 4.5 g  Status:  Discontinued     4.5 g 200 mL/hr over 30 Minutes Intravenous Every 8 hours 09/11/19 1010 09/11/19 1052   09/09/19 0845  ceFAZolin (ANCEF) IVPB 2g/100  mL premix     2 g 200 mL/hr over 30 Minutes Intravenous Every 8 hours 09/09/19 0842 09/09/19 1658       Subjective: No new complaints  Objective: Vitals:   03/19/20 2336 03/20/20 0333 03/20/20 0756 03/20/20 1226  BP: 118/89 (!) 125/95 130/75 103/74  Pulse: 90 87 97 (!) 103  Resp: 14 15 18 18   Temp: 97.6 F (36.4 C) 98.3 F (36.8 C) 98.4 F (36.9 C) 98.9 F (37.2 C)  TempSrc:   Oral Oral  SpO2: 99% 100% 99% 98%  Weight:      Height:        Intake/Output Summary (Last 24 hours) at 03/20/2020 1439 Last data filed at 03/20/2020 1300 Gross per 24 hour  Intake 0 ml  Output --  Net 0 ml   Filed Weights   03/08/20 0348 03/13/20 0652 03/17/20 0500  Weight: 61 kg 63 kg 62 kg    Examination:  General exam: Appears calm and comfortable  Respiratory system: Clear to auscultation. Respiratory effort normal. Cardiovascular system: S1 & S2 heard, RRR.  Gastrointestinal system: Abdomen is nondistended, soft and nontender.. Normal bowel sounds heard. Central nervous system: Alert, grossly nonfocal no new deficits seen Extremities: No cyanosis Skin: No rashes, lesions or ulcers Psychiatry: Cannot be assessed    Data Reviewed: I have personally reviewed following labs and imaging studies  CBC: No results for input(s): WBC, NEUTROABS, HGB, HCT, MCV, PLT in the last 168 hours. Basic Metabolic Panel: No results for input(s): NA, K, CL, CO2, GLUCOSE, BUN, CREATININE, CALCIUM, MG, PHOS in the last 168 hours. GFR: CrCl cannot be calculated (Patient's most recent lab result is older than the maximum 21 days allowed.). Liver Function Tests: No results for input(s): AST, ALT, ALKPHOS, BILITOT, PROT, ALBUMIN in the last 168 hours. No results for input(s): LIPASE, AMYLASE in the last 168 hours. No results for input(s): AMMONIA in the last 168 hours. Coagulation Profile: No results for input(s): INR, PROTIME in the last 168 hours. Cardiac Enzymes: No results for input(s): CKTOTAL,  CKMB, CKMBINDEX, TROPONINI in the last 168 hours. BNP (last 3 results) No results for input(s): PROBNP in the last 8760 hours. HbA1C: No results for input(s): HGBA1C in the last 72 hours. CBG: Recent Labs  Lab 03/19/20 2002 03/19/20 2334 03/20/20 0332 03/20/20 0800 03/20/20 1229  GLUCAP 99 109* 94 98 87   Lipid Profile: No results for input(s): CHOL, HDL, LDLCALC, TRIG, CHOLHDL, LDLDIRECT in the last 72 hours. Thyroid Function Tests: No results for input(s): TSH, T4TOTAL, FREET4, T3FREE, THYROIDAB in the last 72 hours. Anemia Panel: No results for input(s): VITAMINB12, FOLATE, FERRITIN, TIBC, IRON, RETICCTPCT in the last 72 hours. Sepsis Labs: No results for input(s): PROCALCITON, LATICACIDVEN in the last 168 hours.  No results found for this or any previous visit (from the past 240 hour(s)).       Radiology Studies: No results found.      Scheduled Meds: . amLODipine  10 mg Per Tube Daily  . chlorhexidine  15 mL Mouth Rinse BID  . cholecalciferol  1,000 Units Per Tube Daily  .  dextrose  25 g Intravenous Once  . enoxaparin (LOVENOX) injection  40 mg Subcutaneous Daily  . feeding supplement (JEVITY 1.2 CAL)  474 mL Per Tube 4x daily  . feeding supplement (PRO-STAT SUGAR FREE 64)  30 mL Per Tube QID  . folic acid  1 mg Per Tube Daily  . free water  200 mL Per Tube Q4H  . hydrALAZINE  25 mg Per Tube Q6H  . insulin aspart  0-15 Units Subcutaneous Q4H  . levETIRAcetam  500 mg Per Tube BID  . mouth rinse  15 mL Mouth Rinse q12n4p  . metoprolol tartrate  25 mg Per Tube BID  . multivitamin  15 mL Per Tube Daily  . polyethylene glycol  17 g Per Tube BID  . QUEtiapine  50 mg Per Tube QHS  . thiamine  100 mg Per Tube Daily   Continuous Infusions:   LOS: 193 days        Hosie Poisson, MD [facility]   To contact the attending provider between 7A-7P or the covering provider during after hours 7P-7A, please log into the web site [email or web address] and access  using universal [place] password for that web site. If you do not have the password, please call the hospital operator.  03/20/2020, 2:39 PM

## 2020-03-21 DIAGNOSIS — I611 Nontraumatic intracerebral hemorrhage in hemisphere, cortical: Secondary | ICD-10-CM | POA: Diagnosis not present

## 2020-03-21 LAB — GLUCOSE, CAPILLARY
Glucose-Capillary: 100 mg/dL — ABNORMAL HIGH (ref 70–99)
Glucose-Capillary: 103 mg/dL — ABNORMAL HIGH (ref 70–99)
Glucose-Capillary: 140 mg/dL — ABNORMAL HIGH (ref 70–99)
Glucose-Capillary: 87 mg/dL (ref 70–99)
Glucose-Capillary: 91 mg/dL (ref 70–99)
Glucose-Capillary: 99 mg/dL (ref 70–99)

## 2020-03-21 NOTE — Progress Notes (Signed)
PROGRESS NOTE    Joshua [name]  OQH:[id number] DOB: April 21, 1981 DOA: 09/09/2019 PCP: Patient, [name]  Brief Narrative:  39 year old gentleman with prior history of seizures admitted for decreased responsiveness and seizures was found to have left hemispheric intracranial hemorrhage patient was intubated and admitted to ICU.  Subsequently he underwent craniotomy for decompression by Dr. Vertell Limber.  Patient had slow neurological recovery and prolonged hospitalization due to seizures and complicated by aspiration pneumonia.  Patient is alert and awake but had significant dysphagia at which time PEG tube was placed on September 25, 2019.  Placement has been challenging as patient does not have insurance and needs long-term care.  Currently waiting for placement. Patient seen and examined today no new complaints at this time. Assessment & Plan:   Principal Problem:   ICH (intracerebral hemorrhage) (HCC) Active Problems:   Subdural hematoma (HCC)   Intracerebral hemorrhage (HCC)   Cytotoxic brain edema (HCC)   Hemorrhagic stroke (HCC)   Acute respiratory failure ([place])   Essential hypertension   Severe protein-calorie malnutrition (HCC)   CKD (chronic kidney disease), stage IV (HCC)   Seizure (HCC)   AKI (acute kidney injury) ([place])   Left intracranial hemorrhage Probably secondary to uncontrolled hypertension, s/p decompressive craniotomy and hematoma evacuation on 09/09/2019 by Dr. Vertell Limber. Patient remains aphasic with right hemiplegia and severe cognitive impairment.   History of seizure disorder Continue with Keppra, no seizure activity in the last 48 hours.   Klebsiella UTI Completed the course of cefazolin.  Aspiration pneumonia S/p completion of empiric antibiotics   Stage III CKD Creatinine appears to be at baseline, no new labs done this morning.   Nutrition S/p PEG tube placement.   Essential hypertension Blood pressure well controlled at this time continue with  Norvasc and metoprolol.  And hydralazine   Mild anemia of chronic disease Patient has not allowed anybody to draw blood at this time.     DVT prophylaxis: Lovenox Code Status: Full code Family Communication: None at bedside  disposition Plan:  . Patient is homeless has severe cognitive impairment, needs long-term care patient is not stable for discharge home, TOC consulted for placement and is pending.   Consultants:   Trauma  PCCM  Neurosurgery  neurology   Procedures:   Intubation  Extubation  Mechanical ventilation  Craniotomy  PEG tube placement  Tube feeds   antimicrobials:  Anti-infectives (From admission, onward)   Start     Dose/Rate Route Frequency Ordered Stop   09/27/19 1130  cephALEXin (KEFLEX) 250 MG/5ML suspension 500 mg     500 mg Per Tube Every 12 hours 09/27/19 1052 09/29/19 2250   09/25/19 1445  ceFAZolin (ANCEF) IVPB 2g/100 mL premix  Status:  Discontinued     2 g 200 mL/hr over 30 Minutes Intravenous Every 12 hours 09/25/19 1417 09/27/19 1052   09/14/19 1000  Ampicillin-Sulbactam (UNASYN) 3 g in sodium chloride 0.9 % 100 mL IVPB     3 g 200 mL/hr over 30 Minutes Intravenous Every 8 hours 09/14/19 0922 09/17/19 1816   09/13/19 1100  vancomycin (VANCOCIN) IVPB 750 mg/150 ml premix  Status:  Discontinued     750 mg 150 mL/hr over 60 Minutes Intravenous Every 24 hours 09/13/19 0728 09/13/19 0838   09/12/19 1100  vancomycin (VANCOCIN) IVPB 1000 mg/200 mL premix  Status:  Discontinued     1,000 mg 200 mL/hr over 60 Minutes Intravenous Every 24 hours 09/11/19 1034 09/13/19 0728   09/11/19 1130  piperacillin-tazobactam (ZOSYN) IVPB  3.375 g  Status:  Discontinued     3.375 g 12.5 mL/hr over 240 Minutes Intravenous Every 8 hours 09/11/19 1054 09/14/19 0845   09/11/19 1100  vancomycin (VANCOCIN) 1,500 mg in sodium chloride 0.9 % 500 mL IVPB     1,500 mg 250 mL/hr over 120 Minutes Intravenous  Once 09/11/19 1034 09/11/19 1900   09/11/19 1015   piperacillin-tazobactam (ZOSYN) IVPB 4.5 g  Status:  Discontinued     4.5 g 200 mL/hr over 30 Minutes Intravenous Every 8 hours 09/11/19 1010 09/11/19 1052   09/09/19 0845  ceFAZolin (ANCEF) IVPB 2g/100 mL premix     2 g 200 mL/hr over 30 Minutes Intravenous Every 8 hours 09/09/19 0842 09/09/19 1658       Subjective: Patient appears comfortable, not in any kind of distress. Objective: Vitals:   03/21/20 0010 03/21/20 0338 03/21/20 0858 03/21/20 1232  BP: 119/83 107/77 112/85 119/85  Pulse: 93 90 100 94  Resp: 18 18 18 18   Temp: 98.3 F (36.8 C) 98.6 F (37 C) 97.7 F (36.5 C) 98.2 F (36.8 C)  TempSrc: Oral Oral Oral Oral  SpO2: 98% 98% 99% 99%  Weight:  63.1 kg    Height:        Intake/Output Summary (Last 24 hours) at 03/21/2020 1323 Last data filed at 03/21/2020 0005 Gross per 24 hour  Intake 475 ml  Output --  Net 475 ml   Filed Weights   03/13/20 0652 03/17/20 0500 03/21/20 0338  Weight: 63 kg 62 kg 63.1 kg    Examination:  General exam: Alert and comfortable Respiratory system: Clear to auscultation bilaterally, no wheezing or rhonchi Cardiovascular system: S1-S2 heard, regular rate rhythm Gastrointestinal system: Diminished soft, nontender bowel sounds normal. Central nervous system: Alert, no new deficits evident Extremities: No cyanosis Skin: No rashes seen  psychiatry: Cannot be assessed    Data Reviewed: I have personally reviewed following labs and imaging studies  CBC: No results for input(s): WBC, NEUTROABS, HGB, HCT, MCV, PLT in the last 168 hours. Basic Metabolic Panel: No results for input(s): NA, K, CL, CO2, GLUCOSE, BUN, CREATININE, CALCIUM, MG, PHOS in the last 168 hours. GFR: CrCl cannot be calculated (Patient's most recent lab result is older than the maximum 21 days allowed.). Liver Function Tests: No results for input(s): AST, ALT, ALKPHOS, BILITOT, PROT, ALBUMIN in the last 168 hours. No results for input(s): LIPASE, AMYLASE in  the last 168 hours. No results for input(s): AMMONIA in the last 168 hours. Coagulation Profile: No results for input(s): INR, PROTIME in the last 168 hours. Cardiac Enzymes: No results for input(s): CKTOTAL, CKMB, CKMBINDEX, TROPONINI in the last 168 hours. BNP (last 3 results) No results for input(s): PROBNP in the last 8760 hours. HbA1C: No results for input(s): HGBA1C in the last 72 hours. CBG: Recent Labs  Lab 03/20/20 2007 03/20/20 2355 03/21/20 0338 03/21/20 0901 03/21/20 1233  GLUCAP 116* 158* 91 87 100*   Lipid Profile: No results for input(s): CHOL, HDL, LDLCALC, TRIG, CHOLHDL, LDLDIRECT in the last 72 hours. Thyroid Function Tests: No results for input(s): TSH, T4TOTAL, FREET4, T3FREE, THYROIDAB in the last 72 hours. Anemia Panel: No results for input(s): VITAMINB12, FOLATE, FERRITIN, TIBC, IRON, RETICCTPCT in the last 72 hours. Sepsis Labs: No results for input(s): PROCALCITON, LATICACIDVEN in the last 168 hours.  No results found for this or any previous visit (from the past 240 hour(s)).       Radiology Studies: No results found.  Scheduled Meds: . amLODipine  10 mg Per Tube Daily  . chlorhexidine  15 mL Mouth Rinse BID  . cholecalciferol  1,000 Units Per Tube Daily  . dextrose  25 g Intravenous Once  . enoxaparin (LOVENOX) injection  40 mg Subcutaneous Daily  . feeding supplement (JEVITY 1.2 CAL)  474 mL Per Tube 4x daily  . feeding supplement (PRO-STAT SUGAR FREE 64)  30 mL Per Tube QID  . folic acid  1 mg Per Tube Daily  . free water  200 mL Per Tube Q4H  . hydrALAZINE  25 mg Per Tube Q6H  . insulin aspart  0-15 Units Subcutaneous Q4H  . levETIRAcetam  500 mg Per Tube BID  . mouth rinse  15 mL Mouth Rinse q12n4p  . metoprolol tartrate  25 mg Per Tube BID  . multivitamin  15 mL Per Tube Daily  . polyethylene glycol  17 g Per Tube BID  . QUEtiapine  50 mg Per Tube QHS  . thiamine  100 mg Per Tube Daily   Continuous Infusions:   LOS:  194 days        Hosie Poisson, MD [facility]   To contact the attending provider between 7A-7P or the covering provider during after hours 7P-7A, please log into the web site [email or web address] and access using universal [place] password for that web site. If you do not have the password, please call the hospital operator.  03/21/2020, 1:23 PM

## 2020-03-22 DIAGNOSIS — I611 Nontraumatic intracerebral hemorrhage in hemisphere, cortical: Secondary | ICD-10-CM | POA: Diagnosis not present

## 2020-03-22 LAB — GLUCOSE, CAPILLARY
Glucose-Capillary: 114 mg/dL — ABNORMAL HIGH (ref 70–99)
Glucose-Capillary: 115 mg/dL — ABNORMAL HIGH (ref 70–99)
Glucose-Capillary: 142 mg/dL — ABNORMAL HIGH (ref 70–99)
Glucose-Capillary: 88 mg/dL (ref 70–99)
Glucose-Capillary: 95 mg/dL (ref 70–99)
Glucose-Capillary: 98 mg/dL (ref 70–99)

## 2020-03-22 NOTE — Progress Notes (Signed)
PROGRESS NOTE    Joshua [name]  YBO:[id number] DOB: 1981/03/15 DOA: 09/09/2019 PCP: Patient, [name]  Brief Narrative:  39 year old gentleman with prior history of seizures admitted for decreased responsiveness and seizures was found to have left hemispheric intracranial hemorrhage patient was intubated and admitted to ICU.  Subsequently he underwent craniotomy for decompression by Dr. Vertell Limber.  Patient had slow neurological recovery and prolonged hospitalization due to seizures and complicated by aspiration pneumonia.  Patient is alert and awake but had significant dysphagia at which time PEG tube was placed on September 25, 2019.  Placement has been challenging as patient does not have insurance and needs long-term care.  Currently waiting for placement. Patient seen and examined at bedside, no new complaints today.  Assessment & Plan:   Principal Problem:   ICH (intracerebral hemorrhage) (HCC) Active Problems:   Subdural hematoma (HCC)   Intracerebral hemorrhage (HCC)   Cytotoxic brain edema (HCC)   Hemorrhagic stroke (HCC)   Acute respiratory failure ([place])   Essential hypertension   Severe protein-calorie malnutrition (HCC)   CKD (chronic kidney disease), stage IV (HCC)   Seizure (HCC)   AKI (acute kidney injury) ([place])   Left intracranial hemorrhage/cytotoxic edema.  Probably secondary to uncontrolled hypertension, s/p decompressive craniotomy and hematoma evacuation on 09/09/2019 by Dr. Vertell Limber. Patient remains aphasic with right hemiplegia and severe cognitive impairment. No new complaints.    History of seizure disorder Continue with Keppra,  No seizure activity overnight.    Klebsiella UTI Completed the course of cefazolin.  Aspiration pneumonia S/p completion of empiric antibiotics   Stage III CKD Creatinine appears to be back to baseline, no new labs, pt refusing.    Nutrition S/p PEG tube placement. Taking oral  Now.   Essential hypertension Blood  pressure at goal.  Continue with Norvasc, metoprolol and hydralazine.   Mild anemia of chronic disease Patient has not allowed anybody to draw blood at this time.     DVT prophylaxis: Lovenox Code Status: Full code Family Communication: None at bedside  disposition Plan:  . Patient is homeless has severe cognitive impairment, needs long-term care patient is not stable for discharge home, TOC consulted for placement and is pending.   Consultants:   Trauma  PCCM  Neurosurgery  neurology   Procedures:   Intubation  Extubation  Mechanical ventilation  Craniotomy  PEG tube placement  Tube feeds   antimicrobials:  Anti-infectives (From admission, onward)   Start     Dose/Rate Route Frequency Ordered Stop   09/27/19 1130  cephALEXin (KEFLEX) 250 MG/5ML suspension 500 mg     500 mg Per Tube Every 12 hours 09/27/19 1052 09/29/19 2250   09/25/19 1445  ceFAZolin (ANCEF) IVPB 2g/100 mL premix  Status:  Discontinued     2 g 200 mL/hr over 30 Minutes Intravenous Every 12 hours 09/25/19 1417 09/27/19 1052   09/14/19 1000  Ampicillin-Sulbactam (UNASYN) 3 g in sodium chloride 0.9 % 100 mL IVPB     3 g 200 mL/hr over 30 Minutes Intravenous Every 8 hours 09/14/19 0922 09/17/19 1816   09/13/19 1100  vancomycin (VANCOCIN) IVPB 750 mg/150 ml premix  Status:  Discontinued     750 mg 150 mL/hr over 60 Minutes Intravenous Every 24 hours 09/13/19 0728 09/13/19 0838   09/12/19 1100  vancomycin (VANCOCIN) IVPB 1000 mg/200 mL premix  Status:  Discontinued     1,000 mg 200 mL/hr over 60 Minutes Intravenous Every 24 hours 09/11/19 1034 09/13/19 0728   09/11/19  1130  piperacillin-tazobactam (ZOSYN) IVPB 3.375 g  Status:  Discontinued     3.375 g 12.5 mL/hr over 240 Minutes Intravenous Every 8 hours 09/11/19 1054 09/14/19 0845   09/11/19 1100  vancomycin (VANCOCIN) 1,500 mg in sodium chloride 0.9 % 500 mL IVPB     1,500 mg 250 mL/hr over 120 Minutes Intravenous  Once 09/11/19 1034  09/11/19 1900   09/11/19 1015  piperacillin-tazobactam (ZOSYN) IVPB 4.5 g  Status:  Discontinued     4.5 g 200 mL/hr over 30 Minutes Intravenous Every 8 hours 09/11/19 1010 09/11/19 1052   09/09/19 0845  ceFAZolin (ANCEF) IVPB 2g/100 mL premix     2 g 200 mL/hr over 30 Minutes Intravenous Every 8 hours 09/09/19 0842 09/09/19 1658       Subjective: Patient appears comfortable, not in any kind of distress Objective: Vitals:   03/21/20 2351 03/22/20 0412 03/22/20 0834 03/22/20 1154  BP: 112/87 (!) 124/99 135/78 (!) 124/91  Pulse: 93 90 82 79  Resp: 20 20 18 17   Temp: 98.4 F (36.9 C) (!) 97.5 F (36.4 C) 98 F (36.7 C) 97.6 F (36.4 C)  TempSrc: Oral Oral Oral Oral  SpO2: 98% 100% 99% 100%  Weight:      Height:        Intake/Output Summary (Last 24 hours) at 03/22/2020 1539 Last data filed at 03/22/2020 0900 Gross per 24 hour  Intake 640 ml  Output --  Net 640 ml   Filed Weights   03/13/20 0652 03/17/20 0500 03/21/20 0338  Weight: 63 kg 62 kg 63.1 kg    Examination:  General exam: Alert awake and laying in bed. Respiratory system: Air entry fair, no wheezing or rhonchi Cardiovascular system: S1-S2 heard, regular rate rhythm. Gastrointestinal system: Patient would not allow me to examine his abdomen Central nervous system: Alert and appears comfortable aphasic.  Extremities: No pedal edema or cyanosis Skin: No rashes seen psychiatry: Cannot be assessed    Data Reviewed: I have personally reviewed following labs and imaging studies  CBC: No results for input(s): WBC, NEUTROABS, HGB, HCT, MCV, PLT in the last 168 hours. Basic Metabolic Panel: No results for input(s): NA, K, CL, CO2, GLUCOSE, BUN, CREATININE, CALCIUM, MG, PHOS in the last 168 hours. GFR: CrCl cannot be calculated (Patient's most recent lab result is older than the maximum 21 days allowed.). Liver Function Tests: No results for input(s): AST, ALT, ALKPHOS, BILITOT, PROT, ALBUMIN in the last 168  hours. No results for input(s): LIPASE, AMYLASE in the last 168 hours. No results for input(s): AMMONIA in the last 168 hours. Coagulation Profile: No results for input(s): INR, PROTIME in the last 168 hours. Cardiac Enzymes: No results for input(s): CKTOTAL, CKMB, CKMBINDEX, TROPONINI in the last 168 hours. BNP (last 3 results) No results for input(s): PROBNP in the last 8760 hours. HbA1C: No results for input(s): HGBA1C in the last 72 hours. CBG: Recent Labs  Lab 03/21/20 2021 03/21/20 2355 03/22/20 0409 03/22/20 0837 03/22/20 1159  GLUCAP 99 140* 98 88 95   Lipid Profile: No results for input(s): CHOL, HDL, LDLCALC, TRIG, CHOLHDL, LDLDIRECT in the last 72 hours. Thyroid Function Tests: No results for input(s): TSH, T4TOTAL, FREET4, T3FREE, THYROIDAB in the last 72 hours. Anemia Panel: No results for input(s): VITAMINB12, FOLATE, FERRITIN, TIBC, IRON, RETICCTPCT in the last 72 hours. Sepsis Labs: No results for input(s): PROCALCITON, LATICACIDVEN in the last 168 hours.  No results found for this or any previous visit (from the past  240 hour(s)).       Radiology Studies: No results found.      Scheduled Meds: . amLODipine  10 mg Per Tube Daily  . chlorhexidine  15 mL Mouth Rinse BID  . cholecalciferol  1,000 Units Per Tube Daily  . dextrose  25 g Intravenous Once  . enoxaparin (LOVENOX) injection  40 mg Subcutaneous Daily  . feeding supplement (JEVITY 1.2 CAL)  474 mL Per Tube 4x daily  . feeding supplement (PRO-STAT SUGAR FREE 64)  30 mL Per Tube QID  . folic acid  1 mg Per Tube Daily  . free water  200 mL Per Tube Q4H  . hydrALAZINE  25 mg Per Tube Q6H  . insulin aspart  0-15 Units Subcutaneous Q4H  . levETIRAcetam  500 mg Per Tube BID  . mouth rinse  15 mL Mouth Rinse q12n4p  . metoprolol tartrate  25 mg Per Tube BID  . multivitamin  15 mL Per Tube Daily  . polyethylene glycol  17 g Per Tube BID  . QUEtiapine  50 mg Per Tube QHS  . thiamine  100 mg Per  Tube Daily   Continuous Infusions:   LOS: 195 days        Hosie Poisson, MD [facility]   To contact the attending provider between 7A-7P or the covering provider during after hours 7P-7A, please log into the web site [email or web address] and access using universal [place] password for that web site. If you do not have the password, please call the hospital operator.  03/22/2020, 3:39 PM

## 2020-03-23 DIAGNOSIS — I611 Nontraumatic intracerebral hemorrhage in hemisphere, cortical: Secondary | ICD-10-CM | POA: Diagnosis not present

## 2020-03-23 NOTE — Plan of Care (Signed)
  Problem: Activity: Goal: Risk for activity intolerance will decrease Outcome: Progressing   Problem: Coping: Goal: Level of anxiety will decrease Outcome: Progressing   

## 2020-03-23 NOTE — Progress Notes (Signed)
Patient allowed tech to check vital signs and blood sugar however, patient refused to let this nurse feed him and flush his PEG.

## 2020-03-23 NOTE — Progress Notes (Signed)
PROGRESS NOTE    Joshua [name]  XBJ:[id number] DOB: June 19, 1981 DOA: 09/09/2019 PCP: Patient, [name]  Brief Narrative:  39 year old gentleman with prior history of seizures admitted for decreased responsiveness and seizures was found to have left hemispheric intracranial hemorrhage patient was intubated and admitted to ICU.  Subsequently he underwent craniotomy for decompression by Dr. Vertell Limber.  Patient had slow neurological recovery and prolonged hospitalization due to seizures and complicated by aspiration pneumonia.  Patient is alert and awake but had significant dysphagia at which time PEG tube was placed on September 25, 2019.  Placement has been challenging as patient does not have insurance and needs long-term care.  Currently waiting for placement. No new issues overnight. Pt did not want to eat his breakfast today.  Assessment & Plan:   Principal Problem:   ICH (intracerebral hemorrhage) (HCC) Active Problems:   Subdural hematoma (HCC)   Intracerebral hemorrhage (HCC)   Cytotoxic brain edema (HCC)   Hemorrhagic stroke (HCC)   Acute respiratory failure ([place])   Essential hypertension   Severe protein-calorie malnutrition (HCC)   CKD (chronic kidney disease), stage IV (HCC)   Seizure (HCC)   AKI (acute kidney injury) ([place])   Left intracranial hemorrhage/cytotoxic edema.  Probably secondary to uncontrolled hypertension, s/p decompressive craniotomy and hematoma evacuation on 09/09/2019 by Dr. Vertell Limber. Patient remains aphasic with right hemiplegia and severe cognitive impairment. Pt nodding yes / no for questions.    History of seizure disorder Continue with Keppra,  No seizure activity overnight.    Klebsiella UTI Completed the course of cefazolin.  Aspiration pneumonia S/p completion of empiric antibiotics   Stage III CKD Creatinine is 1.2 in 01/17/20. No further lab draws.  Pt refusing.    Nutrition S/p PEG tube placement. Taking oral  Now.   Essential  hypertension Well-controlled blood pressure parameters   Mild anemia of chronic disease Patient has not allowed anybody to draw blood at this time.     DVT prophylaxis: Lovenox Code Status: Full code Family Communication: None at bedside  disposition Plan:  . Patient is homeless has severe cognitive impairment, needs long-term care patient is not stable for discharge home, TOC consulted for placement and is pending.   Consultants:   Trauma  PCCM  Neurosurgery  neurology   Procedures:   Intubation  Extubation  Mechanical ventilation  Craniotomy  PEG tube placement  Tube feeds   antimicrobials:  Anti-infectives (From admission, onward)   Start     Dose/Rate Route Frequency Ordered Stop   09/27/19 1130  cephALEXin (KEFLEX) 250 MG/5ML suspension 500 mg     500 mg Per Tube Every 12 hours 09/27/19 1052 09/29/19 2250   09/25/19 1445  ceFAZolin (ANCEF) IVPB 2g/100 mL premix  Status:  Discontinued     2 g 200 mL/hr over 30 Minutes Intravenous Every 12 hours 09/25/19 1417 09/27/19 1052   09/14/19 1000  Ampicillin-Sulbactam (UNASYN) 3 g in sodium chloride 0.9 % 100 mL IVPB     3 g 200 mL/hr over 30 Minutes Intravenous Every 8 hours 09/14/19 0922 09/17/19 1816   09/13/19 1100  vancomycin (VANCOCIN) IVPB 750 mg/150 ml premix  Status:  Discontinued     750 mg 150 mL/hr over 60 Minutes Intravenous Every 24 hours 09/13/19 0728 09/13/19 0838   09/12/19 1100  vancomycin (VANCOCIN) IVPB 1000 mg/200 mL premix  Status:  Discontinued     1,000 mg 200 mL/hr over 60 Minutes Intravenous Every 24 hours 09/11/19 1034 09/13/19 0728   09/11/19  1130  piperacillin-tazobactam (ZOSYN) IVPB 3.375 g  Status:  Discontinued     3.375 g 12.5 mL/hr over 240 Minutes Intravenous Every 8 hours 09/11/19 1054 09/14/19 0845   09/11/19 1100  vancomycin (VANCOCIN) 1,500 mg in sodium chloride 0.9 % 500 mL IVPB     1,500 mg 250 mL/hr over 120 Minutes Intravenous  Once 09/11/19 1034 09/11/19 1900    09/11/19 1015  piperacillin-tazobactam (ZOSYN) IVPB 4.5 g  Status:  Discontinued     4.5 g 200 mL/hr over 30 Minutes Intravenous Every 8 hours 09/11/19 1010 09/11/19 1052   09/09/19 0845  ceFAZolin (ANCEF) IVPB 2g/100 mL premix     2 g 200 mL/hr over 30 Minutes Intravenous Every 8 hours 09/09/19 0842 09/09/19 1658       Subjective: Patient did not want to eat breakfast today, no new complaints Objective: Vitals:   03/22/20 2315 03/23/20 0355 03/23/20 0447 03/23/20 1200  BP: 113/85 90/75 116/87 111/83  Pulse: (!) 101 94 91 96  Resp: 18 18 16 18   Temp: 98.5 F (36.9 C) 98.3 F (36.8 C) (!) 97.2 F (36.2 C)   TempSrc: Oral Oral Oral   SpO2: 98% 98% 98% 100%  Weight:      Height:        Intake/Output Summary (Last 24 hours) at 03/23/2020 1535 Last data filed at 03/23/2020 1500 Gross per 24 hour  Intake 474 ml  Output 1 ml  Net 473 ml   Filed Weights   03/13/20 0652 03/17/20 0500 03/21/20 0338  Weight: 63 kg 62 kg 63.1 kg    Examination:  General exam: Alert and comfortable Respiratory system: Air entry fair bilateral, no wheezing or rhonchi. Cardiovascular system: 1 S2 heard, regular rate rhythm, no JVD. Gastrointestinal system: Abdomen is soft, nontender nondistended, bowel sounds normal Central nervous system: Alert, aphasic able to move all extremities. Extremities: No pedal edema or cyanosis Skin: No rashes seen psychiatry: Appears calm    Data Reviewed: I have personally reviewed following labs and imaging studies  CBC: No results for input(s): WBC, NEUTROABS, HGB, HCT, MCV, PLT in the last 168 hours. Basic Metabolic Panel: No results for input(s): NA, K, CL, CO2, GLUCOSE, BUN, CREATININE, CALCIUM, MG, PHOS in the last 168 hours. GFR: CrCl cannot be calculated (Patient's most recent lab result is older than the maximum 21 days allowed.). Liver Function Tests: No results for input(s): AST, ALT, ALKPHOS, BILITOT, PROT, ALBUMIN in the last 168 hours. No  results for input(s): LIPASE, AMYLASE in the last 168 hours. No results for input(s): AMMONIA in the last 168 hours. Coagulation Profile: No results for input(s): INR, PROTIME in the last 168 hours. Cardiac Enzymes: No results for input(s): CKTOTAL, CKMB, CKMBINDEX, TROPONINI in the last 168 hours. BNP (last 3 results) No results for input(s): PROBNP in the last 8760 hours. HbA1C: No results for input(s): HGBA1C in the last 72 hours. CBG: Recent Labs  Lab 03/22/20 0837 03/22/20 1159 03/22/20 1544 03/22/20 1946 03/22/20 2353  GLUCAP 88 95 115* 114* 142*   Lipid Profile: No results for input(s): CHOL, HDL, LDLCALC, TRIG, CHOLHDL, LDLDIRECT in the last 72 hours. Thyroid Function Tests: No results for input(s): TSH, T4TOTAL, FREET4, T3FREE, THYROIDAB in the last 72 hours. Anemia Panel: No results for input(s): VITAMINB12, FOLATE, FERRITIN, TIBC, IRON, RETICCTPCT in the last 72 hours. Sepsis Labs: No results for input(s): PROCALCITON, LATICACIDVEN in the last 168 hours.  No results found for this or any previous visit (from the past 240  hour(s)).       Radiology Studies: No results found.      Scheduled Meds: . amLODipine  10 mg Per Tube Daily  . chlorhexidine  15 mL Mouth Rinse BID  . cholecalciferol  1,000 Units Per Tube Daily  . dextrose  25 g Intravenous Once  . enoxaparin (LOVENOX) injection  40 mg Subcutaneous Daily  . feeding supplement (JEVITY 1.2 CAL)  474 mL Per Tube 4x daily  . feeding supplement (PRO-STAT SUGAR FREE 64)  30 mL Per Tube QID  . folic acid  1 mg Per Tube Daily  . free water  200 mL Per Tube Q4H  . hydrALAZINE  25 mg Per Tube Q6H  . insulin aspart  0-15 Units Subcutaneous Q4H  . levETIRAcetam  500 mg Per Tube BID  . mouth rinse  15 mL Mouth Rinse q12n4p  . metoprolol tartrate  25 mg Per Tube BID  . multivitamin  15 mL Per Tube Daily  . polyethylene glycol  17 g Per Tube BID  . QUEtiapine  50 mg Per Tube QHS  . thiamine  100 mg Per Tube  Daily   Continuous Infusions:   LOS: 196 days        Hosie Poisson, MD [facility]   To contact the attending provider between 7A-7P or the covering provider during after hours 7P-7A, please log into the web site [email or web address] and access using universal [place] password for that web site. If you do not have the password, please call the hospital operator.  03/23/2020, 3:35 PM

## 2020-03-24 DIAGNOSIS — I611 Nontraumatic intracerebral hemorrhage in hemisphere, cortical: Secondary | ICD-10-CM | POA: Diagnosis not present

## 2020-03-24 NOTE — Progress Notes (Addendum)
PROGRESS NOTE    Joshua [name]  NUU:[id number] DOB: 1981/11/27 DOA: 09/09/2019 PCP: Patient, [name]  Brief Narrative:  39 year old gentleman with prior history of seizures admitted for decreased responsiveness and seizures was found to have left hemispheric intracranial hemorrhage patient was intubated and admitted to ICU.  Subsequently he underwent craniotomy for decompression by Dr. Vertell Limber.  Patient had slow neurological recovery and prolonged hospitalization due to seizures and complicated by aspiration pneumonia.  Patient is alert and awake but had significant dysphagia at which time PEG tube was placed on September 25, 2019.  Placement has been challenging as patient does not have insurance and needs long-term care.  Currently waiting for placement.  No new events overnight.  He is alert and following simple commands. Still aphasic.   Assessment & Plan:   Principal Problem:   ICH (intracerebral hemorrhage) (HCC) Active Problems:   Subdural hematoma (HCC)   Intracerebral hemorrhage (HCC)   Cytotoxic brain edema (HCC)   Hemorrhagic stroke (HCC)   Acute respiratory failure ([place])   Essential hypertension   Severe protein-calorie malnutrition (HCC)   CKD (chronic kidney disease), stage IV (HCC)   Seizure (HCC)   AKI (acute kidney injury) ([place])   Left intracranial hemorrhage/cytotoxic edema.  Probably secondary to uncontrolled hypertension, s/p decompressive craniotomy and hematoma evacuation on 09/09/2019 by Dr. Vertell Limber. Patient remains aphasic with right hemiplegia and severe cognitive impairment.  No new events overnight, is laying in the bed.    History of seizure disorder No seizures overnight, resume keppra.    Klebsiella UTI Completed the course of cefazolin.  Aspiration pneumonia S/p completion of empiric antibiotics   Stage III CKD Creatinine is 1.2 in 01/17/20. No further lab draws.  Pt refusing.    Nutrition S/p PEG tube placement. Taking oral  Now.    Essential hypertension well controlled, resume metoprolol, norvasc.    Mild anemia of chronic disease Patient has not allowed anybody to draw blood at this time.  Agitation:  Resume seroquel at bedtime.      DVT prophylaxis: Lovenox Code Status: Full code Family Communication: None at bedside  disposition Plan:  . Patient is homeless has severe cognitive impairment, needs long-term care patient is not stable for discharge home, TOC consulted for placement and is pending.   Consultants:   Trauma  PCCM  Neurosurgery  neurology   Procedures:   Intubation  Extubation  Mechanical ventilation  Craniotomy  PEG tube placement  Tube feeds   antimicrobials:  Anti-infectives (From admission, onward)   Start     Dose/Rate Route Frequency Ordered Stop   09/27/19 1130  cephALEXin (KEFLEX) 250 MG/5ML suspension 500 mg     500 mg Per Tube Every 12 hours 09/27/19 1052 09/29/19 2250   09/25/19 1445  ceFAZolin (ANCEF) IVPB 2g/100 mL premix  Status:  Discontinued     2 g 200 mL/hr over 30 Minutes Intravenous Every 12 hours 09/25/19 1417 09/27/19 1052   09/14/19 1000  Ampicillin-Sulbactam (UNASYN) 3 g in sodium chloride 0.9 % 100 mL IVPB     3 g 200 mL/hr over 30 Minutes Intravenous Every 8 hours 09/14/19 0922 09/17/19 1816   09/13/19 1100  vancomycin (VANCOCIN) IVPB 750 mg/150 ml premix  Status:  Discontinued     750 mg 150 mL/hr over 60 Minutes Intravenous Every 24 hours 09/13/19 0728 09/13/19 0838   09/12/19 1100  vancomycin (VANCOCIN) IVPB 1000 mg/200 mL premix  Status:  Discontinued     1,000 mg 200 mL/hr over  60 Minutes Intravenous Every 24 hours 09/11/19 1034 09/13/19 0728   09/11/19 1130  piperacillin-tazobactam (ZOSYN) IVPB 3.375 g  Status:  Discontinued     3.375 g 12.5 mL/hr over 240 Minutes Intravenous Every 8 hours 09/11/19 1054 09/14/19 0845   09/11/19 1100  vancomycin (VANCOCIN) 1,500 mg in sodium chloride 0.9 % 500 mL IVPB     1,500 mg 250 mL/hr over  120 Minutes Intravenous  Once 09/11/19 1034 09/11/19 1900   09/11/19 1015  piperacillin-tazobactam (ZOSYN) IVPB 4.5 g  Status:  Discontinued     4.5 g 200 mL/hr over 30 Minutes Intravenous Every 8 hours 09/11/19 1010 09/11/19 1052   09/09/19 0845  ceFAZolin (ANCEF) IVPB 2g/100 mL premix     2 g 200 mL/hr over 30 Minutes Intravenous Every 8 hours 09/09/19 0842 09/09/19 1658       Subjective: Appears comfortable, aphasic.  No new events overnight.   Objective: Vitals:   03/23/20 2329 03/24/20 0406 03/24/20 0748 03/24/20 1113  BP: (!) 122/97 (!) 121/95 (!) 116/91 (!) 125/91  Pulse: 94 91 93 100  Resp: 17 17 16 19   Temp: 98.4 F (36.9 C) 97.7 F (36.5 C) 98 F (36.7 C) 97.7 F (36.5 C)  TempSrc: Oral  Oral Oral  SpO2: 99% 97% 98% 100%  Weight:      Height:        Intake/Output Summary (Last 24 hours) at 03/24/2020 1235 Last data filed at 03/23/2020 1625 Gross per 24 hour  Intake 948 ml  Output --  Net 948 ml   Filed Weights   03/13/20 0652 03/17/20 0500 03/21/20 0338  Weight: 63 kg 62 kg 63.1 kg    Examination:  General exam:alert and comfortable, not in distress.  Respiratory system: clear to auscultation, no wheezing or rhonchi.  Cardiovascular system: S1S2, RRR, no JVD, no pedal edema.  Gastrointestinal system: Abdomen is soft, non tender PEG in place, non distended. Bowel sounds wnl.  Central nervous system: alert , aphasic, able to move all extremities.  Extremities: no pedal edema.  Skin: no ulcers seen.  psychiatry: no agitation noted, calm,     Data Reviewed: I have personally reviewed following labs and imaging studies  CBC: No results for input(s): WBC, NEUTROABS, HGB, HCT, MCV, PLT in the last 168 hours. Basic Metabolic Panel: No results for input(s): NA, K, CL, CO2, GLUCOSE, BUN, CREATININE, CALCIUM, MG, PHOS in the last 168 hours. GFR: CrCl cannot be calculated (Patient's most recent lab result is older than the maximum 21 days allowed.). Liver  Function Tests: No results for input(s): AST, ALT, ALKPHOS, BILITOT, PROT, ALBUMIN in the last 168 hours. No results for input(s): LIPASE, AMYLASE in the last 168 hours. No results for input(s): AMMONIA in the last 168 hours. Coagulation Profile: No results for input(s): INR, PROTIME in the last 168 hours. Cardiac Enzymes: No results for input(s): CKTOTAL, CKMB, CKMBINDEX, TROPONINI in the last 168 hours. BNP (last 3 results) No results for input(s): PROBNP in the last 8760 hours. HbA1C: No results for input(s): HGBA1C in the last 72 hours. CBG: Recent Labs  Lab 03/22/20 0837 03/22/20 1159 03/22/20 1544 03/22/20 1946 03/22/20 2353  GLUCAP 88 95 115* 114* 142*   Lipid Profile: No results for input(s): CHOL, HDL, LDLCALC, TRIG, CHOLHDL, LDLDIRECT in the last 72 hours. Thyroid Function Tests: No results for input(s): TSH, T4TOTAL, FREET4, T3FREE, THYROIDAB in the last 72 hours. Anemia Panel: No results for input(s): VITAMINB12, FOLATE, FERRITIN, TIBC, IRON, RETICCTPCT in the  last 72 hours. Sepsis Labs: No results for input(s): PROCALCITON, LATICACIDVEN in the last 168 hours.  No results found for this or any previous visit (from the past 240 hour(s)).       Radiology Studies: No results found.      Scheduled Meds: . amLODipine  10 mg Per Tube Daily  . chlorhexidine  15 mL Mouth Rinse BID  . cholecalciferol  1,000 Units Per Tube Daily  . dextrose  25 g Intravenous Once  . enoxaparin (LOVENOX) injection  40 mg Subcutaneous Daily  . feeding supplement (JEVITY 1.2 CAL)  474 mL Per Tube 4x daily  . feeding supplement (PRO-STAT SUGAR FREE 64)  30 mL Per Tube QID  . folic acid  1 mg Per Tube Daily  . free water  200 mL Per Tube Q4H  . hydrALAZINE  25 mg Per Tube Q6H  . insulin aspart  0-15 Units Subcutaneous Q4H  . levETIRAcetam  500 mg Per Tube BID  . mouth rinse  15 mL Mouth Rinse q12n4p  . metoprolol tartrate  25 mg Per Tube BID  . multivitamin  15 mL Per Tube  Daily  . polyethylene glycol  17 g Per Tube BID  . QUEtiapine  50 mg Per Tube QHS  . thiamine  100 mg Per Tube Daily   Continuous Infusions:   LOS: 197 days        Hosie Poisson, MD [facility]   To contact the attending provider between 7A-7P or the covering provider during after hours 7P-7A, please log into the web site [email or web address] and access using universal [place] password for that web site. If you do not have the password, please call the hospital operator.  03/24/2020, 12:35 PM

## 2020-03-25 DIAGNOSIS — I611 Nontraumatic intracerebral hemorrhage in hemisphere, cortical: Secondary | ICD-10-CM | POA: Diagnosis not present

## 2020-03-25 LAB — GLUCOSE, CAPILLARY
Glucose-Capillary: 91 mg/dL (ref 70–99)
Glucose-Capillary: 85 mg/dL (ref 70–99)
Glucose-Capillary: 80 mg/dL (ref 70–99)
Glucose-Capillary: 87 mg/dL (ref 70–99)
Glucose-Capillary: 208 mg/dL — ABNORMAL HIGH (ref 70–99)
Glucose-Capillary: 87 mg/dL (ref 70–99)
Glucose-Capillary: 101 mg/dL — ABNORMAL HIGH (ref 70–99)
Glucose-Capillary: 147 mg/dL — ABNORMAL HIGH (ref 70–99)
Glucose-Capillary: 91 mg/dL (ref 70–99)
Glucose-Capillary: 111 mg/dL — ABNORMAL HIGH (ref 70–99)
Glucose-Capillary: 145 mg/dL — ABNORMAL HIGH (ref 70–99)
Glucose-Capillary: 106 mg/dL — ABNORMAL HIGH (ref 70–99)
Glucose-Capillary: 95 mg/dL (ref 70–99)
Glucose-Capillary: 95 mg/dL (ref 70–99)
Glucose-Capillary: 79 mg/dL (ref 70–99)
Glucose-Capillary: 107 mg/dL — ABNORMAL HIGH (ref 70–99)

## 2020-03-25 NOTE — Plan of Care (Signed)
  Problem: Activity: Goal: Risk for activity intolerance will decrease Outcome: Progressing   Problem: Nutrition: Goal: Adequate nutrition will be maintained Outcome: Progressing   

## 2020-03-25 NOTE — Progress Notes (Signed)
PROGRESS NOTE    Joshua [name]  ERX:[id number] DOB: 1981/11/19 DOA: 09/09/2019 PCP: Patient, [name]  Brief Narrative:  39 year old gentleman with prior history of seizures admitted for decreased responsiveness and seizures was found to have left hemispheric intracranial hemorrhage patient was intubated and admitted to ICU.  Subsequently he underwent craniotomy for decompression by Dr. Vertell Limber.  Patient had slow neurological recovery and prolonged hospitalization due to seizures and complicated by aspiration pneumonia.  Patient is alert and awake but had significant dysphagia at which time PEG tube was placed on September 25, 2019.  Placement has been challenging as patient does not have insurance and needs long-term care.  Currently waiting for placement.  No new events overnight.  He is alert and following simple commands. Still aphasic.   Assessment & Plan:   Principal Problem:   ICH (intracerebral hemorrhage) (HCC) Active Problems:   Subdural hematoma (HCC)   Intracerebral hemorrhage (HCC)   Cytotoxic brain edema (HCC)   Hemorrhagic stroke (HCC)   Acute respiratory failure ([place])   Essential hypertension   Severe protein-calorie malnutrition (HCC)   CKD (chronic kidney disease), stage IV (HCC)   Seizure (HCC)   AKI (acute kidney injury) ([place])   Left intracranial hemorrhage/cytotoxic edema.  Probably secondary to uncontrolled hypertension, s/p decompressive craniotomy and hematoma evacuation on 09/09/2019 by Dr. Vertell Limber. Patient remains aphasic with right hemiplegia and severe cognitive impairment.  No new events overnight, is laying in the bed.    History of seizure disorder No seizures overnight, resume keppra.    Klebsiella UTI Completed the course of cefazolin.  Aspiration pneumonia S/p completion of empiric antibiotics   Stage III CKD Creatinine is 1.2 in 01/17/20. No further lab draws.  Pt refusing.    Nutrition S/p PEG tube placement. Taking oral  Now.    Essential hypertension well controlled, resume metoprolol, norvasc.    Mild anemia of chronic disease Patient has not allowed anybody to draw blood at this time.  Agitation:  Resume seroquel at bedtime.      DVT prophylaxis: Lovenox Code Status: Full code Family Communication: None at bedside  disposition Plan:  . Patient is homeless has severe cognitive impairment, needs long-term care patient is not stable for discharge home, TOC consulted for placement and is pending.   Consultants:   Trauma  PCCM  Neurosurgery  neurology   Procedures:   Intubation  Extubation  Mechanical ventilation  Craniotomy  PEG tube placement  Tube feeds   antimicrobials:  Anti-infectives (From admission, onward)   Start     Dose/Rate Route Frequency Ordered Stop   09/27/19 1130  cephALEXin (KEFLEX) 250 MG/5ML suspension 500 mg     500 mg Per Tube Every 12 hours 09/27/19 1052 09/29/19 2250   09/25/19 1445  ceFAZolin (ANCEF) IVPB 2g/100 mL premix  Status:  Discontinued     2 g 200 mL/hr over 30 Minutes Intravenous Every 12 hours 09/25/19 1417 09/27/19 1052   09/14/19 1000  Ampicillin-Sulbactam (UNASYN) 3 g in sodium chloride 0.9 % 100 mL IVPB     3 g 200 mL/hr over 30 Minutes Intravenous Every 8 hours 09/14/19 0922 09/17/19 1816   09/13/19 1100  vancomycin (VANCOCIN) IVPB 750 mg/150 ml premix  Status:  Discontinued     750 mg 150 mL/hr over 60 Minutes Intravenous Every 24 hours 09/13/19 0728 09/13/19 0838   09/12/19 1100  vancomycin (VANCOCIN) IVPB 1000 mg/200 mL premix  Status:  Discontinued     1,000 mg 200 mL/hr over  60 Minutes Intravenous Every 24 hours 09/11/19 1034 09/13/19 0728   09/11/19 1130  piperacillin-tazobactam (ZOSYN) IVPB 3.375 g  Status:  Discontinued     3.375 g 12.5 mL/hr over 240 Minutes Intravenous Every 8 hours 09/11/19 1054 09/14/19 0845   09/11/19 1100  vancomycin (VANCOCIN) 1,500 mg in sodium chloride 0.9 % 500 mL IVPB     1,500 mg 250 mL/hr over  120 Minutes Intravenous  Once 09/11/19 1034 09/11/19 1900   09/11/19 1015  piperacillin-tazobactam (ZOSYN) IVPB 4.5 g  Status:  Discontinued     4.5 g 200 mL/hr over 30 Minutes Intravenous Every 8 hours 09/11/19 1010 09/11/19 1052   09/09/19 0845  ceFAZolin (ANCEF) IVPB 2g/100 mL premix     2 g 200 mL/hr over 30 Minutes Intravenous Every 8 hours 09/09/19 0842 09/09/19 1658       Subjective: Appears comfortable, aphasic.  No new events overnight.   Objective: Vitals:   03/25/20 0306 03/25/20 0735 03/25/20 1156 03/25/20 1200  BP: 117/86 (!) 131/105 (!) 124/94   Pulse: 98 (!) 101 88   Resp: 17 18 18    Temp: 98.9 F (37.2 C) 97.8 F (36.6 C) (!) 97.3 F (36.3 C) 98.3 F (36.8 C)  TempSrc: Oral Oral Oral Axillary  SpO2: 100% 100% 100%   Weight:      Height:        Intake/Output Summary (Last 24 hours) at 03/25/2020 1502 Last data filed at 03/25/2020 1348 Gross per 24 hour  Intake 833 ml  Output --  Net 833 ml   Filed Weights   03/13/20 0652 03/17/20 0500 03/21/20 0338  Weight: 63 kg 62 kg 63.1 kg    Examination:  General exam:alert and comfortable, not in distress.  Respiratory system: clear to auscultation, no wheezing or rhonchi.  Cardiovascular system: S1S2, RRR, no JVD, no pedal edema.  Gastrointestinal system: Abdomen is soft, non tender PEG in place, non distended. Bowel sounds wnl.  Central nervous system: alert , aphasic, able to move all extremities.  Extremities: no pedal edema.  Skin: no ulcers seen.  psychiatry: no agitation noted, calm,     Data Reviewed: I have personally reviewed following labs and imaging studies  CBC: No results for input(s): WBC, NEUTROABS, HGB, HCT, MCV, PLT in the last 168 hours. Basic Metabolic Panel: No results for input(s): NA, K, CL, CO2, GLUCOSE, BUN, CREATININE, CALCIUM, MG, PHOS in the last 168 hours. GFR: CrCl cannot be calculated (Patient's most recent lab result is older than the maximum 21 days allowed.). Liver  Function Tests: No results for input(s): AST, ALT, ALKPHOS, BILITOT, PROT, ALBUMIN in the last 168 hours. No results for input(s): LIPASE, AMYLASE in the last 168 hours. No results for input(s): AMMONIA in the last 168 hours. Coagulation Profile: No results for input(s): INR, PROTIME in the last 168 hours. Cardiac Enzymes: No results for input(s): CKTOTAL, CKMB, CKMBINDEX, TROPONINI in the last 168 hours. BNP (last 3 results) No results for input(s): PROBNP in the last 8760 hours. HbA1C: No results for input(s): HGBA1C in the last 72 hours. CBG: Recent Labs  Lab 03/24/20 1953 03/24/20 2311 03/25/20 0308 03/25/20 0734 03/25/20 1153  GLUCAP 111* 145* 106* 95 95   Lipid Profile: No results for input(s): CHOL, HDL, LDLCALC, TRIG, CHOLHDL, LDLDIRECT in the last 72 hours. Thyroid Function Tests: No results for input(s): TSH, T4TOTAL, FREET4, T3FREE, THYROIDAB in the last 72 hours. Anemia Panel: No results for input(s): VITAMINB12, FOLATE, FERRITIN, TIBC, IRON, RETICCTPCT in the  last 72 hours. Sepsis Labs: No results for input(s): PROCALCITON, LATICACIDVEN in the last 168 hours.  No results found for this or any previous visit (from the past 240 hour(s)).       Radiology Studies: No results found.      Scheduled Meds: . amLODipine  10 mg Per Tube Daily  . chlorhexidine  15 mL Mouth Rinse BID  . cholecalciferol  1,000 Units Per Tube Daily  . dextrose  25 g Intravenous Once  . enoxaparin (LOVENOX) injection  40 mg Subcutaneous Daily  . feeding supplement (JEVITY 1.2 CAL)  474 mL Per Tube 4x daily  . feeding supplement (PRO-STAT SUGAR FREE 64)  30 mL Per Tube QID  . folic acid  1 mg Per Tube Daily  . free water  200 mL Per Tube Q4H  . hydrALAZINE  25 mg Per Tube Q6H  . insulin aspart  0-15 Units Subcutaneous Q4H  . levETIRAcetam  500 mg Per Tube BID  . mouth rinse  15 mL Mouth Rinse q12n4p  . metoprolol tartrate  25 mg Per Tube BID  . multivitamin  15 mL Per Tube  Daily  . polyethylene glycol  17 g Per Tube BID  . QUEtiapine  50 mg Per Tube QHS  . thiamine  100 mg Per Tube Daily   Continuous Infusions:   LOS: 198 days        Hosie Poisson, MD [facility]   To contact the attending provider between 7A-7P or the covering provider during after hours 7P-7A, please log into the web site [email or web address] and access using universal [place] password for that web site. If you do not have the password, please call the hospital operator.  03/25/2020, 3:02 PM

## 2020-03-26 DIAGNOSIS — I611 Nontraumatic intracerebral hemorrhage in hemisphere, cortical: Secondary | ICD-10-CM | POA: Diagnosis not present

## 2020-03-26 LAB — GLUCOSE, CAPILLARY
Glucose-Capillary: 111 mg/dL — ABNORMAL HIGH (ref 70–99)
Glucose-Capillary: 148 mg/dL — ABNORMAL HIGH (ref 70–99)
Glucose-Capillary: 81 mg/dL (ref 70–99)
Glucose-Capillary: 84 mg/dL (ref 70–99)
Glucose-Capillary: 96 mg/dL (ref 70–99)
Glucose-Capillary: 99 mg/dL (ref 70–99)

## 2020-03-26 NOTE — Progress Notes (Signed)
PROGRESS NOTE    Joshua [name]  YTK:[id number] DOB: [birth year] DOA: 09/09/2019 PCP: Patient, [name]  Brief Narrative:  39 year old gentleman with prior history of seizures admitted for decreased responsiveness and seizures was found to have left hemispheric intracranial hemorrhage patient was intubated and admitted to ICU.  Subsequently he underwent craniotomy for decompression by Dr. Vertell Limber.  Patient had slow neurological recovery and prolonged hospitalization due to seizures and complicated by aspiration pneumonia.  Patient is alert and awake but had significant dysphagia at which time PEG tube was placed on September 25, 2019.  Placement has been challenging as patient does not have insurance and needs long-term care.  Currently waiting for placement at SNF.   No new events overnight. Pt has been refusing meals over the last one week  As per RN, he was able to take some grape juice today.   Assessment & Plan:   Principal Problem:   ICH (intracerebral hemorrhage) (HCC) Active Problems:   Subdural hematoma (HCC)   Intracerebral hemorrhage (HCC)   Cytotoxic brain edema (HCC)   Hemorrhagic stroke (HCC)   Acute respiratory failure ([place])   Essential hypertension   Severe protein-calorie malnutrition (HCC)   CKD (chronic kidney disease), stage IV (HCC)   Seizure (HCC)   AKI (acute kidney injury) ([place])   Left intracranial hemorrhage/cytotoxic edema.  Probably secondary to uncontrolled hypertension, s/p decompressive craniotomy and hematoma evacuation on 09/09/2019 by Dr. Vertell Limber. Patient remains aphasic with right hemiplegia and severe cognitive impairment.  Pt is alert , tracking, occasionally would say yes or no.     History of seizure disorder No seizures overnight. Resume keppra BID.    Klebsiella UTI Completed the course of cefazolin.  Aspiration pneumonia S/p completion of empiric antibiotics   Stage III CKD Creatinine is 1.2 in 01/17/20. No new blood draws.     Essential hypertension Well controlled, resume norvasc. And hydralazine.    Mild anemia of chronic disease No new blood draws.   Agitation:  Resume seroquel at bedtime.   Severe Malnutrition in the context of acute illness: Nutrition on board and recommendations for Jevity of 1.2 via PEG  474 ml 4 times daily.  Free water pushes and oral intake as tolerated.      DVT prophylaxis: Lovenox Code Status: Full code Family Communication: None at bedside , called mother and left a voicemail.  disposition Plan:  . Patient is homeless has severe cognitive impairment, needs long-term care patient is not stable for discharge home, TOC consulted for placement and is pending.   Consultants:   Trauma  PCCM  Neurosurgery  neurology   Procedures:   Intubation  Extubation  Mechanical ventilation  Craniotomy  PEG tube placement  Tube feeds   antimicrobials:  Anti-infectives (From admission, onward)   Start     Dose/Rate Route Frequency Ordered Stop   09/27/19 1130  cephALEXin (KEFLEX) 250 MG/5ML suspension 500 mg     500 mg Per Tube Every 12 hours 09/27/19 1052 09/29/19 2250   09/25/19 1445  ceFAZolin (ANCEF) IVPB 2g/100 mL premix  Status:  Discontinued     2 g 200 mL/hr over 30 Minutes Intravenous Every 12 hours 09/25/19 1417 09/27/19 1052   09/14/19 1000  Ampicillin-Sulbactam (UNASYN) 3 g in sodium chloride 0.9 % 100 mL IVPB     3 g 200 mL/hr over 30 Minutes Intravenous Every 8 hours 09/14/19 0922 09/17/19 1816   09/13/19 1100  vancomycin (VANCOCIN) IVPB 750 mg/150 ml premix  Status:  Discontinued  750 mg 150 mL/hr over 60 Minutes Intravenous Every 24 hours 09/13/19 0728 09/13/19 0838   09/12/19 1100  vancomycin (VANCOCIN) IVPB 1000 mg/200 mL premix  Status:  Discontinued     1,000 mg 200 mL/hr over 60 Minutes Intravenous Every 24 hours 09/11/19 1034 09/13/19 0728   09/11/19 1130  piperacillin-tazobactam (ZOSYN) IVPB 3.375 g  Status:  Discontinued      3.375 g 12.5 mL/hr over 240 Minutes Intravenous Every 8 hours 09/11/19 1054 09/14/19 0845   09/11/19 1100  vancomycin (VANCOCIN) 1,500 mg in sodium chloride 0.9 % 500 mL IVPB     1,500 mg 250 mL/hr over 120 Minutes Intravenous  Once 09/11/19 1034 09/11/19 1900   09/11/19 1015  piperacillin-tazobactam (ZOSYN) IVPB 4.5 g  Status:  Discontinued     4.5 g 200 mL/hr over 30 Minutes Intravenous Every 8 hours 09/11/19 1010 09/11/19 1052   09/09/19 0845  ceFAZolin (ANCEF) IVPB 2g/100 mL premix     2 g 200 mL/hr over 30 Minutes Intravenous Every 8 hours 09/09/19 0842 09/09/19 1658       Subjective: Appears calm, but refused exam today.   Objective: Vitals:   03/25/20 2342 03/26/20 0406 03/26/20 0831 03/26/20 1212  BP: 115/84 115/87 124/88 90/73  Pulse: 95 90 93 96  Resp: 18 19 18 18   Temp: 98.1 F (36.7 C) 98 F (36.7 C) 97.7 F (36.5 C) 98.2 F (36.8 C)  TempSrc: Oral Oral Oral Oral  SpO2: 96% 99% 95% 100%  Weight:      Height:        Intake/Output Summary (Last 24 hours) at 03/26/2020 1530 Last data filed at 03/26/2020 1315 Gross per 24 hour  Intake 1068 ml  Output --  Net 1068 ml   Filed Weights   03/13/20 0652 03/17/20 0500 03/21/20 0338  Weight: 63 kg 62 kg 63.1 kg    Examination:  Pt refused exam , tried twice, but he was pushing my arm away.    Data Reviewed: I have personally reviewed following labs and imaging studies  CBC: No results for input(s): WBC, NEUTROABS, HGB, HCT, MCV, PLT in the last 168 hours. Basic Metabolic Panel: No results for input(s): NA, K, CL, CO2, GLUCOSE, BUN, CREATININE, CALCIUM, MG, PHOS in the last 168 hours. GFR: CrCl cannot be calculated (Patient's most recent lab result is older than the maximum 21 days allowed.). Liver Function Tests: No results for input(s): AST, ALT, ALKPHOS, BILITOT, PROT, ALBUMIN in the last 168 hours. No results for input(s): LIPASE, AMYLASE in the last 168 hours. No results for input(s): AMMONIA in the  last 168 hours. Coagulation Profile: No results for input(s): INR, PROTIME in the last 168 hours. Cardiac Enzymes: No results for input(s): CKTOTAL, CKMB, CKMBINDEX, TROPONINI in the last 168 hours. BNP (last 3 results) No results for input(s): PROBNP in the last 8760 hours. HbA1C: No results for input(s): HGBA1C in the last 72 hours. CBG: Recent Labs  Lab 03/25/20 1955 03/25/20 2342 03/26/20 0403 03/26/20 0748 03/26/20 1141  GLUCAP 107* 148* 84 111* 96   Lipid Profile: No results for input(s): CHOL, HDL, LDLCALC, TRIG, CHOLHDL, LDLDIRECT in the last 72 hours. Thyroid Function Tests: No results for input(s): TSH, T4TOTAL, FREET4, T3FREE, THYROIDAB in the last 72 hours. Anemia Panel: No results for input(s): VITAMINB12, FOLATE, FERRITIN, TIBC, IRON, RETICCTPCT in the last 72 hours. Sepsis Labs: No results for input(s): PROCALCITON, LATICACIDVEN in the last 168 hours.  No results found for this or any previous  visit (from the past 240 hour(s)).       Radiology Studies: No results found.      Scheduled Meds: . amLODipine  10 mg Per Tube Daily  . chlorhexidine  15 mL Mouth Rinse BID  . cholecalciferol  1,000 Units Per Tube Daily  . dextrose  25 g Intravenous Once  . enoxaparin (LOVENOX) injection  40 mg Subcutaneous Daily  . feeding supplement (JEVITY 1.2 CAL)  474 mL Per Tube 4x daily  . feeding supplement (PRO-STAT SUGAR FREE 64)  30 mL Per Tube QID  . folic acid  1 mg Per Tube Daily  . free water  200 mL Per Tube Q4H  . hydrALAZINE  25 mg Per Tube Q6H  . insulin aspart  0-15 Units Subcutaneous Q4H  . levETIRAcetam  500 mg Per Tube BID  . mouth rinse  15 mL Mouth Rinse q12n4p  . metoprolol tartrate  25 mg Per Tube BID  . multivitamin  15 mL Per Tube Daily  . polyethylene glycol  17 g Per Tube BID  . QUEtiapine  50 mg Per Tube QHS  . thiamine  100 mg Per Tube Daily   Continuous Infusions:   LOS: 199 days        Hosie Poisson, MD [facility]   To contact the attending provider between 7A-7P or the covering provider during after hours 7P-7A, please log into the web site [email or web address] and access using universal [place] password for that web site. If you do not have the password, please call the hospital operator.  03/26/2020, 3:30 PM

## 2020-03-26 NOTE — Plan of Care (Signed)
  Problem: Activity: Goal: Risk for activity intolerance will decrease Outcome: Progressing   Problem: Nutrition: Goal: Adequate nutrition will be maintained Outcome: Progressing   Problem: Elimination: Goal: Will not experience complications related to bowel motility Outcome: Progressing Goal: Will not experience complications related to urinary retention Outcome: Progressing   Problem: Skin Integrity: Goal: Risk for impaired skin integrity will decrease Outcome: Progressing   Problem: Safety: Goal: Ability to remain free from injury will improve Outcome: Progressing   Problem: Intracerebral Hemorrhage Tissue Perfusion: Goal: Complications of Intracerebral Hemorrhage will be minimized Outcome: Progressing

## 2020-03-26 NOTE — Progress Notes (Signed)
Spoke with Joshua [name] patient mother informed her that patient received all his medication except he refused mouth care. I informed of her of no new documentation and that I would leave note for MD to call with updates. Arthor Captain LPN

## 2020-03-26 NOTE — Plan of Care (Signed)
  Problem: Nutrition: Goal: Adequate nutrition will be maintained Outcome: Progressing   Problem: Safety: Goal: Ability to remain free from injury will improve Outcome: Progressing   

## 2020-03-26 NOTE — Progress Notes (Signed)
Nutrition Follow-up  **RD working remotely**  DOCUMENTATION CODES:   Severe malnutrition in context of acute illness/injury  INTERVENTION:  ContinueJevity 1.2 [place] PEG at goal volume of 474 ml (2 cartons/ARCs) given QID.   Provide 30 ml ProstatQID per tube.   Free water flushes of200 ml every 4 hours per tube. (MD to adjust as appropriate)  Tube feeding to provide 2675 kcal, 165grams of protein, and2718m free water.  Continue PO intake for comfort as appropriate/tolerated.   NUTRITION DIAGNOSIS:   Severe Malnutrition related to acute illness(intracerebral hemorrage) as evidenced by severe fat depletion, severe muscle depletion.  Ongoing.  GOAL:   Patient will meet greater than or equal to 90% of their needs  Met with TF.   MONITOR:   TF tolerance, Skin, Weight trends, Labs, I & O's  REASON FOR ASSESSMENT:   Ventilator, Consult Enteral/tube feeding initiation and management  ASSESSMENT:   39yo male admitted with large left intracranial hemorrhage; S/P craniotomy. PMH includes HTN, alcoholism, noncompliance.  9/12left frontal temporal craniectomy and evacuation hematoma.  9/23 Extubated.  10/1 PEG placed 11/23- Nutrition Focused Physical Exam performed, severe subcutaneous fat loss and severe muscle wasting identified.   Per MD, placement continues to be challenging as pt is uninsured and needs long-term placement.Pt is nonverbal and refusing exams.   Pt noted to have accepted0% ofmeals over the last week.  Admission weight: 63 kg Current weight: 63.1 kg  Medications reviewed and include:Vitamin D3, 4717mJevity 1.2 cal QID, 3022mro-stat QID, Folvite, 200m72mee water Q4, Novolog, MVI, Vitamin B1, Miralax  No labs completed since 1/20.  [id number] Order:   Diet Order            Diet regular Room service appropriate? Yes; Fluid consistency: Thin  Diet effective now              EDUCATION NEEDS:   Not  appropriate for education at this time  Skin:  Skin Assessment: Reviewed RN Assessment Skin Integrity Issues:: Other (Comment) Other: skin tear right hip  Last BM:  03/24/20  Height:   Ht Readings from Last 1 Encounters:  09/09/19 '5\' 9"'  (1.753 m)    Weight:   Wt Readings from Last 1 Encounters:  03/21/20 63.1 kg    BMI:  Body mass index is 20.54 kg/m.  Estimated Nutritional Needs:   Kcal:  [id number]:  130-170 grams  Fluid:  >/= 2 L/day   AmanLarkin Ina, RD, LDN RD pager number and weekend/on-call pager number located in [place]

## 2020-03-27 DIAGNOSIS — G936 Cerebral edema: Secondary | ICD-10-CM | POA: Diagnosis not present

## 2020-03-27 LAB — GLUCOSE, CAPILLARY
Glucose-Capillary: 118 mg/dL — ABNORMAL HIGH (ref 70–99)
Glucose-Capillary: 165 mg/dL — ABNORMAL HIGH (ref 70–99)
Glucose-Capillary: 83 mg/dL (ref 70–99)
Glucose-Capillary: 91 mg/dL (ref 70–99)
Glucose-Capillary: 99 mg/dL (ref 70–99)
Glucose-Capillary: 99 mg/dL (ref 70–99)

## 2020-03-27 NOTE — Progress Notes (Signed)
PROGRESS NOTE  Joshua [name] WGN:[id number] DOB: April 13, 1981 DOA: 09/09/2019 PCP: Patient, [name]  HPI/Recap of past 24 hours:  He opens eyes, does not talk, does not follow command  Assessment/Plan: Principal Problem:   ICH (intracerebral hemorrhage) (HCC) Active Problems:   Subdural hematoma (HCC)   Intracerebral hemorrhage (HCC)   Cytotoxic brain edema (HCC)   Hemorrhagic stroke ([place])   Acute respiratory failure ([place])   Essential hypertension   Severe protein-calorie malnutrition ([place])   CKD (chronic kidney disease), stage IV (HCC)   Seizure (HCC)   AKI (acute kidney injury) ([place])  Left intracranial hemorrhage/cytotoxic edema.  Probably secondary to uncontrolled hypertension, s/p decompressive craniotomy and hematoma evacuation on 09/09/2019 by Dr. Vertell Limber. Patient remains aphasic with right hemiplegia and severe cognitive impairment.  Pt is alert , tracking, occasionally would say yes or no.     History of seizure disorder No seizures overnight. Resume keppra BID.    Klebsiella UTI Completed the course of cefazolin.  Aspiration pneumonia S/p completion of empiric antibiotics   Stage III CKD Creatinine is 1.2 in 01/17/20. No new blood draws.    Essential hypertension Well controlled, resume norvasc. And hydralazine.    Mild anemia of chronic disease No new blood draws.   Agitation:  Resume seroquel at bedtime.   Severe Malnutrition in the context of acute illness: Nutrition on board and recommendations for Jevity of 1.2 via PEG  474 ml 4 times daily.  Free water pushes and oral intake as tolerated.    DVT prophylaxis: Lovenox Code Status: Full code    Family Communication: patient   Disposition Plan:    Barriers to d/c OR conditions which need to be met to effect a safe d/c:   Patient is homeless has severe cognitive impairment, needs long-term care patient is not stable for discharge home, TOC consulted for placement    Consultants:   Trauma  PCCM  Neurosurgery  neurology   Procedures:   Intubation  Extubation  Mechanical ventilation  Craniotomy  PEG tube placement  Tube feeds   Antibiotics:  *   Objective: BP 103/77 (BP Location: Left Arm)   Pulse 85   Temp 97.7 F (36.5 C) (Oral)   Resp 14   Ht '5\' 9"'  (1.753 m)   Wt 63.1 kg   SpO2 97%   BMI 20.54 kg/m   Intake/Output Summary (Last 24 hours) at 03/27/2020 1918 Last data filed at 03/26/2020 2122 Gross per 24 hour  Intake 30 ml  Output --  Net 30 ml   Filed Weights   03/13/20 0652 03/17/20 0500 03/21/20 0338  Weight: 63 kg 62 kg 63.1 kg    Exam: Patient is examined daily including today on 03/27/2020, exams remain the same as of yesterday except that has changed    General:  NAD, open eyes, not following command  Physical exam limited  Data Reviewed: Basic Metabolic Panel: No results for input(s): NA, K, CL, CO2, GLUCOSE, BUN, CREATININE, CALCIUM, MG, PHOS in the last 168 hours. Liver Function Tests: No results for input(s): AST, ALT, ALKPHOS, BILITOT, PROT, ALBUMIN in the last 168 hours. No results for input(s): LIPASE, AMYLASE in the last 168 hours. No results for input(s): AMMONIA in the last 168 hours. CBC: No results for input(s): WBC, NEUTROABS, HGB, HCT, MCV, PLT in the last 168 hours. Cardiac Enzymes:   No results for input(s): CKTOTAL, CKMB, CKMBINDEX, TROPONINI in the last 168 hours. BNP (last 3 results) No results for input(s): BNP in  the last 8760 hours.  ProBNP (last 3 results) No results for input(s): PROBNP in the last 8760 hours.  CBG: Recent Labs  Lab 03/26/20 2025 03/27/20 0047 03/27/20 0446 03/27/20 0737 03/27/20 1154  GLUCAP 99 118* 99 99 91    No results found for this or any previous visit (from the past 240 hour(s)).   Studies: No results found.  Scheduled Meds: . amLODipine  10 mg Per Tube Daily  . chlorhexidine  15 mL Mouth Rinse BID  . cholecalciferol  1,000  Units Per Tube Daily  . dextrose  25 g Intravenous Once  . enoxaparin (LOVENOX) injection  40 mg Subcutaneous Daily  . feeding supplement (JEVITY 1.2 CAL)  474 mL Per Tube 4x daily  . feeding supplement (PRO-STAT SUGAR FREE 64)  30 mL Per Tube QID  . folic acid  1 mg Per Tube Daily  . free water  200 mL Per Tube Q4H  . hydrALAZINE  25 mg Per Tube Q6H  . insulin aspart  0-15 Units Subcutaneous Q4H  . levETIRAcetam  500 mg Per Tube BID  . mouth rinse  15 mL Mouth Rinse q12n4p  . metoprolol tartrate  25 mg Per Tube BID  . multivitamin  15 mL Per Tube Daily  . polyethylene glycol  17 g Per Tube BID  . QUEtiapine  50 mg Per Tube QHS  . thiamine  100 mg Per Tube Daily    Continuous Infusions:   Time spent: 72mns I have personally reviewed and interpreted on  03/27/2020 daily labs, imagings as discussed above under date review session and assessment and plans.  I reviewed all nursing notes, pharmacy notes, consultant notes,  vitals, pertinent old records  I have discussed plan of care as described above with RN , patient  on 03/27/2020   FFlorencia ReasonsMD, PhD, FACP  [facility]  Available via Epic secure chat 7am-7pm for nonurgent issues Please page for urgent issues, pager number available through [email or web address] .   03/27/2020, 7:18 PM  LOS: 200 days

## 2020-03-27 NOTE — Plan of Care (Signed)
  Problem: Activity: Goal: Risk for activity intolerance will decrease Outcome: Progressing   

## 2020-03-28 DIAGNOSIS — G936 Cerebral edema: Secondary | ICD-10-CM | POA: Diagnosis not present

## 2020-03-28 LAB — GLUCOSE, CAPILLARY
Glucose-Capillary: 81 mg/dL (ref 70–99)
Glucose-Capillary: 86 mg/dL (ref 70–99)
Glucose-Capillary: 132 mg/dL — ABNORMAL HIGH (ref 70–99)
Glucose-Capillary: 138 mg/dL — ABNORMAL HIGH (ref 70–99)
Glucose-Capillary: 78 mg/dL (ref 70–99)
Glucose-Capillary: 109 mg/dL — ABNORMAL HIGH (ref 70–99)

## 2020-03-28 NOTE — Plan of Care (Signed)
  Problem: Health Behavior/Discharge Planning: Goal: Ability to manage health-related needs will improve Outcome: Progressing   Problem: Self-Care: Goal: Ability to participate in self-care as condition permits will improve Outcome: Progressing   

## 2020-03-28 NOTE — Progress Notes (Signed)
PROGRESS NOTE  Joshua [name] [id number]:[id number] DOB: June 12, 1981 DOA: 09/09/2019 PCP: Patient, [name]  HPI/Recap of past 24 hours:  He opens eyes, does not talk, he shake his head when I asked whether I can examine him  Assessment/Plan: Principal Problem:   ICH (intracerebral hemorrhage) ([place]) Active Problems:   Subdural hematoma (HCC)   Intracerebral hemorrhage (HCC)   Cytotoxic brain edema ([place])   Hemorrhagic stroke ([place])   Acute respiratory failure ([place])   Essential hypertension   Severe protein-calorie malnutrition ([place])   CKD (chronic kidney disease), stage IV (HCC)   Seizure ([place])   AKI (acute kidney injury) ([place])  Left intracranial hemorrhage/cytotoxic edema.  Probably secondary to uncontrolled hypertension, s/p decompressive craniotomy and hematoma evacuation on 09/09/2019 by Dr. Vertell Limber. Patient remains aphasic with right hemiplegia and severe cognitive impairment.  Pt is alert , tracking, occasionally would say yes or no.     History of seizure disorder No seizures overnight. Resume keppra BID.    Klebsiella UTI Completed the course of cefazolin.  Aspiration pneumonia S/p completion of empiric antibiotics   Stage III CKD Creatinine is 1.2 in 01/17/20. No new blood draws.    Essential hypertension Well controlled, resume norvasc. And hydralazine.    Mild anemia of chronic disease No new blood draws.   Agitation:  Resume seroquel at bedtime.   Severe Malnutrition in the context of acute illness: Nutrition on board and recommendations for Jevity of 1.2 via PEG  474 ml 4 times daily.  Free water pushes and oral intake as tolerated.    DVT prophylaxis: Lovenox Code Status: Full code    Family Communication: patient   Disposition Plan:    Barriers to d/c OR conditions which need to be met to effect a safe d/c:   Patient is homeless has severe cognitive impairment, needs long-term care patient is not stable for discharge home,  TOC consulted for placement   Consultants:   Trauma  PCCM  Neurosurgery  neurology   Procedures:   Intubation  Extubation  Mechanical ventilation  Craniotomy  PEG tube placement  Tube feeds   Antibiotics:  *   Objective: BP 100/78   Pulse 72   Temp 98.6 F (37 C) (Oral)   Resp 16   Ht '5\' 9"'  (1.753 m)   Wt 63.1 kg   SpO2 100%   BMI 20.54 kg/m   Intake/Output Summary (Last 24 hours) at 03/28/2020 1128 Last data filed at 03/28/2020 0947 Gross per 24 hour  Intake 440 ml  Output --  Net 440 ml   Filed Weights   03/13/20 0652 03/17/20 0500 03/21/20 0338  Weight: 63 kg 62 kg 63.1 kg    Exam: Patient is examined daily including today on 03/28/2020, exams remain the same as of yesterday except that has changed    General:  NAD, open eyes, not following command  Physical exam limited  Data Reviewed: Basic Metabolic Panel: No results for input(s): NA, K, CL, CO2, GLUCOSE, BUN, CREATININE, CALCIUM, MG, PHOS in the last 168 hours. Liver Function Tests: No results for input(s): AST, ALT, ALKPHOS, BILITOT, PROT, ALBUMIN in the last 168 hours. No results for input(s): LIPASE, AMYLASE in the last 168 hours. No results for input(s): AMMONIA in the last 168 hours. CBC: No results for input(s): WBC, NEUTROABS, HGB, HCT, MCV, PLT in the last 168 hours. Cardiac Enzymes:   No results for input(s): CKTOTAL, CKMB, CKMBINDEX, TROPONINI in the last 168 hours. BNP (last 3 results) No results  for input(s): BNP in the last 8760 hours.  ProBNP (last 3 results) No results for input(s): PROBNP in the last 8760 hours.  CBG: Recent Labs  Lab 03/27/20 1927 03/27/20 2331 03/28/20 0326 03/28/20 0730 03/28/20 1115  GLUCAP 83 165* 81 86 132*    No results found for this or any previous visit (from the past 240 hour(s)).   Studies: No results found.  Scheduled Meds: . amLODipine  10 mg Per Tube Daily  . chlorhexidine  15 mL Mouth Rinse BID  . cholecalciferol   1,000 Units Per Tube Daily  . dextrose  25 g Intravenous Once  . enoxaparin (LOVENOX) injection  40 mg Subcutaneous Daily  . feeding supplement (JEVITY 1.2 CAL)  474 mL Per Tube 4x daily  . feeding supplement (PRO-STAT SUGAR FREE 64)  30 mL Per Tube QID  . folic acid  1 mg Per Tube Daily  . free water  200 mL Per Tube Q4H  . hydrALAZINE  25 mg Per Tube Q6H  . insulin aspart  0-15 Units Subcutaneous Q4H  . levETIRAcetam  500 mg Per Tube BID  . mouth rinse  15 mL Mouth Rinse q12n4p  . metoprolol tartrate  25 mg Per Tube BID  . multivitamin  15 mL Per Tube Daily  . polyethylene glycol  17 g Per Tube BID  . QUEtiapine  50 mg Per Tube QHS  . thiamine  100 mg Per Tube Daily    Continuous Infusions:   Time spent: 68mns I have personally reviewed and interpreted on  03/28/2020 daily labs, imagings as discussed above under date review session and assessment and plans.  I reviewed all nursing notes, pharmacy notes, consultant notes,  vitals, pertinent old records  I have discussed plan of care as described above with RN , patient  on 03/28/2020   FFlorencia ReasonsMD, PhD, FACP  [facility]  Available via Epic secure chat 7am-7pm for nonurgent issues Please page for urgent issues, pager number available through [email or web address] .   03/28/2020, 11:28 AM  LOS: 201 days

## 2020-03-29 DIAGNOSIS — G936 Cerebral edema: Secondary | ICD-10-CM | POA: Diagnosis not present

## 2020-03-29 LAB — GLUCOSE, CAPILLARY
Glucose-Capillary: 102 mg/dL — ABNORMAL HIGH (ref 70–99)
Glucose-Capillary: 110 mg/dL — ABNORMAL HIGH (ref 70–99)
Glucose-Capillary: 112 mg/dL — ABNORMAL HIGH (ref 70–99)
Glucose-Capillary: 122 mg/dL — ABNORMAL HIGH (ref 70–99)
Glucose-Capillary: 156 mg/dL — ABNORMAL HIGH (ref 70–99)
Glucose-Capillary: 91 mg/dL (ref 70–99)

## 2020-03-29 NOTE — Progress Notes (Signed)
PROGRESS NOTE  Jameon Deller DDU:[id number] DOB: [birth year] DOA: 09/09/2019 PCP: Patient, [name]  HPI/Recap of past 24 hours:  He opens eyes, does not talk, he shake his head when I asked whether I can examine him  He refused Lovenox injection Assessment/Plan: Principal Problem:   ICH (intracerebral hemorrhage) ([place]) Active Problems:   Subdural hematoma (HCC)   Intracerebral hemorrhage (HCC)   Cytotoxic brain edema ([place])   Hemorrhagic stroke ([place])   Acute respiratory failure ([place])   Essential hypertension   Severe protein-calorie malnutrition ([place])   CKD (chronic kidney disease), stage IV (HCC)   Seizure ([place])   AKI (acute kidney injury) ([place])  Left intracranial hemorrhage/cytotoxic edema.  Probably secondary to uncontrolled hypertension, s/p decompressive craniotomy and hematoma evacuation on 09/09/2019 by Dr. Vertell Limber. Patient remains aphasic with right hemiplegia and severe cognitive impairment.  Pt is alert , tracking, occasionally would say yes or no.     History of seizure disorder No seizures overnight. Resume keppra BID.    Klebsiella UTI Completed the course of cefazolin.  Aspiration pneumonia S/p completion of empiric antibiotics   Stage III CKD Creatinine is 1.2 in 01/17/20. No new blood draws.    Essential hypertension Well controlled, resume norvasc. And hydralazine.    Mild anemia of chronic disease No new blood draws.   Agitation:  Resume seroquel at bedtime.   Severe Malnutrition in the context of acute illness: Nutrition on board and recommendations for Jevity of 1.2 via PEG  474 ml 4 times daily.  Free water pushes and oral intake as tolerated.    DVT prophylaxis: Lovenox Code Status: Full code    Family Communication: patient   Disposition Plan:    Barriers to d/c OR conditions which need to be met to effect a safe d/c:   Patient is homeless has severe cognitive impairment, needs long-term care patient is  not stable for discharge home, TOC consulted for placement   Consultants:   Trauma  PCCM  Neurosurgery  neurology   Procedures:   Intubation  Extubation  Mechanical ventilation  Craniotomy  PEG tube placement  Tube feeds   Antibiotics:  *   Objective: BP 98/78 (BP Location: Left Arm)   Pulse 93   Temp (!) 97.5 F (36.4 C) (Oral)   Resp 18   Ht '5\' 9"'  (1.753 m)   Wt 63.1 kg   SpO2 100%   BMI 20.54 kg/m   Intake/Output Summary (Last 24 hours) at 03/29/2020 1703 Last data filed at 03/29/2020 0700 Gross per 24 hour  Intake 1714 ml  Output --  Net 1714 ml   Filed Weights   03/13/20 0652 03/17/20 0500 03/21/20 0338  Weight: 63 kg 62 kg 63.1 kg    Exam: Patient is examined daily including today on 03/29/2020, exams remain the same as of yesterday except that has changed    General:  NAD, open eyes, not following command  Physical exam limited  Data Reviewed: Basic Metabolic Panel: No results for input(s): NA, K, CL, CO2, GLUCOSE, BUN, CREATININE, CALCIUM, MG, PHOS in the last 168 hours. Liver Function Tests: No results for input(s): AST, ALT, ALKPHOS, BILITOT, PROT, ALBUMIN in the last 168 hours. No results for input(s): LIPASE, AMYLASE in the last 168 hours. No results for input(s): AMMONIA in the last 168 hours. CBC: No results for input(s): WBC, NEUTROABS, HGB, HCT, MCV, PLT in the last 168 hours. Cardiac Enzymes:   No results for input(s): CKTOTAL, CKMB, CKMBINDEX, TROPONINI in the  last 168 hours. BNP (last 3 results) No results for input(s): BNP in the last 8760 hours.  ProBNP (last 3 results) No results for input(s): PROBNP in the last 8760 hours.  CBG: Recent Labs  Lab 03/28/20 2313 03/29/20 0327 03/29/20 0756 03/29/20 1207 03/29/20 1623  GLUCAP 109* 112* 110* 102* 91    No results found for this or any previous visit (from the past 240 hour(s)).   Studies: No results found.  Scheduled Meds: . amLODipine  10 mg Per Tube  Daily  . chlorhexidine  15 mL Mouth Rinse BID  . cholecalciferol  1,000 Units Per Tube Daily  . dextrose  25 g Intravenous Once  . enoxaparin (LOVENOX) injection  40 mg Subcutaneous Daily  . feeding supplement (JEVITY 1.2 CAL)  474 mL Per Tube 4x daily  . feeding supplement (PRO-STAT SUGAR FREE 64)  30 mL Per Tube QID  . folic acid  1 mg Per Tube Daily  . free water  200 mL Per Tube Q4H  . hydrALAZINE  25 mg Per Tube Q6H  . insulin aspart  0-15 Units Subcutaneous Q4H  . levETIRAcetam  500 mg Per Tube BID  . mouth rinse  15 mL Mouth Rinse q12n4p  . metoprolol tartrate  25 mg Per Tube BID  . multivitamin  15 mL Per Tube Daily  . polyethylene glycol  17 g Per Tube BID  . QUEtiapine  50 mg Per Tube QHS  . thiamine  100 mg Per Tube Daily    Continuous Infusions:   Time spent: 20mns I have personally reviewed and interpreted on  03/29/2020 daily labs, imagings as discussed above under date review session and assessment and plans.  I reviewed all nursing notes, pharmacy notes, consultant notes,  vitals, pertinent old records  I have discussed plan of care as described above with RN , patient  on 03/29/2020   FFlorencia ReasonsMD, PhD, FACP  [facility]  Available via Epic secure chat 7am-7pm for nonurgent issues Please page for urgent issues, pager number available through [email or web address] .   03/29/2020, 5:03 PM  LOS: 202 days

## 2020-03-30 DIAGNOSIS — G936 Cerebral edema: Secondary | ICD-10-CM | POA: Diagnosis not present

## 2020-03-30 NOTE — Progress Notes (Signed)
PROGRESS NOTE  Devion Chriscoe KNL:[id number] DOB: Mar 30, 1981 DOA: 09/09/2019 PCP: Patient, [name]  HPI/Recap of past 24 hours:  He opens eyes, does not talk, he shake his head when I asked whether I can examine him  He refused Lovenox injection  Assessment/Plan: Principal Problem:   ICH (intracerebral hemorrhage) ([place]) Active Problems:   Subdural hematoma (HCC)   Intracerebral hemorrhage (HCC)   Cytotoxic brain edema ([place])   Hemorrhagic stroke ([place])   Acute respiratory failure ([place])   Essential hypertension   Severe protein-calorie malnutrition ([place])   CKD (chronic kidney disease), stage IV (HCC)   Seizure ([place])   AKI (acute kidney injury) ([place])  Left intracranial hemorrhage/cytotoxic edema.  Probably secondary to uncontrolled hypertension, s/p decompressive craniotomy and hematoma evacuation on 09/09/2019 by Dr. Vertell Limber. Patient remains aphasic with right hemiplegia and severe cognitive impairment.  Pt is alert , tracking, occasionally would say yes or no.     History of seizure disorder No seizures overnight. Resume keppra BID.    Klebsiella UTI Completed the course of cefazolin.  Aspiration pneumonia S/p completion of empiric antibiotics   Stage III CKD Creatinine is 1.2 in 01/17/20. No new blood draws.    Essential hypertension Well controlled, resume norvasc. And hydralazine.    Mild anemia of chronic disease No new blood draws.   Agitation:  Resume seroquel at bedtime.   Severe Malnutrition in the context of acute illness: Nutrition on board and recommendations for Jevity of 1.2 via PEG  474 ml 4 times daily.  Free water pushes and oral intake as tolerated.    DVT prophylaxis: Lovenox, refused Code Status: Full code    Family Communication: patient   Disposition Plan:    Barriers to d/c OR conditions which need to be met to effect a safe d/c:   Patient is homeless has severe cognitive impairment, needs long-term care  patient is not stable for discharge home, TOC consulted for placement   Consultants:   Trauma  PCCM  Neurosurgery  neurology   Procedures:   Intubation  Extubation  Mechanical ventilation  Craniotomy  PEG tube placement  Tube feeds   Antibiotics:  *   Objective: BP 100/76 (BP Location: Left Arm)   Pulse 77   Temp 98 F (36.7 C) (Oral)   Resp 17   Ht _0  (1.753 m)   Wt 63.1 kg   SpO2 99%   BMI 20.54 kg/m   Intake/Output Summary (Last 24 hours) at 03/30/2020 1506 Last data filed at 03/29/2020 1800 Gross per 24 hour  Intake 2040 ml  Output --  Net 2040 ml   Filed Weights   03/13/20 0652 03/17/20 0500 03/21/20 0338  Weight: 63 kg 62 kg 63.1 kg    Exam: Patient is examined daily including today on 03/30/2020, exams remain the same as of yesterday except that has changed    General:  NAD, open eyes, not following command  Physical exam limited  Data Reviewed: Basic Metabolic Panel: No results for input(s): NA, K, CL, CO2, GLUCOSE, BUN, CREATININE, CALCIUM, MG, PHOS in the last 168 hours. Liver Function Tests: No results for input(s): AST, ALT, ALKPHOS, BILITOT, PROT, ALBUMIN in the last 168 hours. No results for input(s): LIPASE, AMYLASE in the last 168 hours. No results for input(s): AMMONIA in the last 168 hours. CBC: No results for input(s): WBC, NEUTROABS, HGB, HCT, MCV, PLT in the last 168 hours. Cardiac Enzymes:   No results for input(s): CKTOTAL, CKMB, CKMBINDEX, TROPONINI in  the last 168 hours. BNP (last 3 results) No results for input(s): BNP in the last 8760 hours.  ProBNP (last 3 results) No results for input(s): PROBNP in the last 8760 hours.  CBG: Recent Labs  Lab 03/29/20 0756 03/29/20 1207 03/29/20 1623 03/29/20 1946 03/29/20 2332  GLUCAP 110* 102* 91 156* 122*    No results found for this or any previous visit (from the past 240 hour(s)).   Studies: No results found.  Scheduled Meds: . amLODipine  10 mg Per  Tube Daily  . chlorhexidine  15 mL Mouth Rinse BID  . cholecalciferol  1,000 Units Per Tube Daily  . dextrose  25 g Intravenous Once  . enoxaparin (LOVENOX) injection  40 mg Subcutaneous Daily  . feeding supplement (JEVITY 1.2 CAL)  474 mL Per Tube 4x daily  . feeding supplement (PRO-STAT SUGAR FREE 64)  30 mL Per Tube QID  . folic acid  1 mg Per Tube Daily  . free water  200 mL Per Tube Q4H  . hydrALAZINE  25 mg Per Tube Q6H  . insulin aspart  0-15 Units Subcutaneous Q4H  . levETIRAcetam  500 mg Per Tube BID  . mouth rinse  15 mL Mouth Rinse q12n4p  . metoprolol tartrate  25 mg Per Tube BID  . multivitamin  15 mL Per Tube Daily  . polyethylene glycol  17 g Per Tube BID  . QUEtiapine  50 mg Per Tube QHS  . thiamine  100 mg Per Tube Daily    Continuous Infusions:   Time spent: 80mns I have personally reviewed and interpreted on  03/30/2020 daily labs, imagings as discussed above under date review session and assessment and plans.  I reviewed all nursing notes, pharmacy notes, consultant notes,  vitals, pertinent old records  I have discussed plan of care as described above with RN , patient  on 03/30/2020   FFlorencia ReasonsMD, PhD, FACP  [facility]  Available via Epic secure chat 7am-7pm for nonurgent issues Please page for urgent issues, pager number available through [email or web address] .   03/30/2020, 3:06 PM  LOS: 203 days

## 2020-03-31 DIAGNOSIS — G936 Cerebral edema: Secondary | ICD-10-CM | POA: Diagnosis not present

## 2020-03-31 NOTE — Progress Notes (Signed)
PROGRESS NOTE  Tregan Read JHE:[id number] DOB: 1981-08-22 DOA: 09/09/2019 PCP: Patient, [name]  HPI/Recap of past 24 hours:  He opens eyes, does not talk, he shake his head when I asked whether I can examine him  He refused Lovenox injection  Assessment/Plan: Principal Problem:   ICH (intracerebral hemorrhage) ([place]) Active Problems:   Subdural hematoma (HCC)   Intracerebral hemorrhage (HCC)   Cytotoxic brain edema ([place])   Hemorrhagic stroke ([place])   Acute respiratory failure ([place])   Essential hypertension   Severe protein-calorie malnutrition ([place])   CKD (chronic kidney disease), stage IV (HCC)   Seizure ([place])   AKI (acute kidney injury) ([place])  Left intracranial hemorrhage/cytotoxic edema.  Probably secondary to uncontrolled hypertension, s/p decompressive craniotomy and hematoma evacuation on 09/09/2019 by Dr. Vertell Limber. Patient remains aphasic with right hemiplegia and severe cognitive impairment.  Pt is alert , tracking, occasionally would say yes or no.     History of seizure disorder No seizures overnight. Resume keppra BID.    Klebsiella UTI Completed the course of cefazolin.  Aspiration pneumonia S/p completion of empiric antibiotics   Stage III CKD Creatinine is 1.2 in 01/17/20. No new blood draws.    Essential hypertension Well controlled, resume norvasc. And hydralazine.    Mild anemia of chronic disease No new blood draws.   Agitation:  Resume seroquel at bedtime.   Severe Malnutrition in the context of acute illness: Nutrition on board and recommendations for Jevity of 1.2 via PEG  474 ml 4 times daily.  Free water pushes and oral intake as tolerated.    DVT prophylaxis: Lovenox, refused Code Status: Full code    Family Communication: patient   Disposition Plan:    Barriers to d/c OR conditions which need to be met to effect a safe d/c:   Patient is homeless has severe cognitive impairment, needs long-term care  patient is not stable for discharge home, TOC consulted for placement   Consultants:   Trauma  PCCM  Neurosurgery  neurology   Procedures:   Intubation  Extubation  Mechanical ventilation  Craniotomy  PEG tube placement  Tube feeds   Antibiotics:  *   Objective: BP 101/70 (BP Location: Left Arm)   Pulse 88   Temp 98.3 F (36.8 C) (Oral)   Resp 18   Ht _0  (1.753 m)   Wt 63.1 kg   SpO2 100%   BMI 20.54 kg/m   Intake/Output Summary (Last 24 hours) at 03/31/2020 1340 Last data filed at 03/30/2020 1600 Gross per 24 hour  Intake 800 ml  Output --  Net 800 ml   Filed Weights   03/13/20 0652 03/17/20 0500 03/21/20 0338  Weight: 63 kg 62 kg 63.1 kg    Exam: Patient is examined daily including today on 03/31/2020, exams remain the same as of yesterday except that has changed    General:  NAD, open eyes, right-sided contracture ,not following command  Physical exam limited  Data Reviewed: Basic Metabolic Panel: No results for input(s): NA, K, CL, CO2, GLUCOSE, BUN, CREATININE, CALCIUM, MG, PHOS in the last 168 hours. Liver Function Tests: No results for input(s): AST, ALT, ALKPHOS, BILITOT, PROT, ALBUMIN in the last 168 hours. No results for input(s): LIPASE, AMYLASE in the last 168 hours. No results for input(s): AMMONIA in the last 168 hours. CBC: No results for input(s): WBC, NEUTROABS, HGB, HCT, MCV, PLT in the last 168 hours. Cardiac Enzymes:   No results for input(s): CKTOTAL, CKMB, CKMBINDEX,  TROPONINI in the last 168 hours. BNP (last 3 results) No results for input(s): BNP in the last 8760 hours.  ProBNP (last 3 results) No results for input(s): PROBNP in the last 8760 hours.  CBG: Recent Labs  Lab 03/29/20 0756 03/29/20 1207 03/29/20 1623 03/29/20 1946 03/29/20 2332  GLUCAP 110* 102* 91 156* 122*    No results found for this or any previous visit (from the past 240 hour(s)).   Studies: No results found.  Scheduled Meds: .  amLODipine  10 mg Per Tube Daily  . chlorhexidine  15 mL Mouth Rinse BID  . cholecalciferol  1,000 Units Per Tube Daily  . dextrose  25 g Intravenous Once  . enoxaparin (LOVENOX) injection  40 mg Subcutaneous Daily  . feeding supplement (JEVITY 1.2 CAL)  474 mL Per Tube 4x daily  . feeding supplement (PRO-STAT SUGAR FREE 64)  30 mL Per Tube QID  . folic acid  1 mg Per Tube Daily  . free water  200 mL Per Tube Q4H  . hydrALAZINE  25 mg Per Tube Q6H  . insulin aspart  0-15 Units Subcutaneous Q4H  . levETIRAcetam  500 mg Per Tube BID  . mouth rinse  15 mL Mouth Rinse q12n4p  . metoprolol tartrate  25 mg Per Tube BID  . multivitamin  15 mL Per Tube Daily  . polyethylene glycol  17 g Per Tube BID  . QUEtiapine  50 mg Per Tube QHS  . thiamine  100 mg Per Tube Daily    Continuous Infusions:   Time spent: 56mns I have personally reviewed and interpreted on  03/31/2020 daily labs, imagings as discussed above under date review session and assessment and plans.  I reviewed all nursing notes, pharmacy notes, consultant notes,  vitals, pertinent old records  I have discussed plan of care as described above with RN , patient  on 03/31/2020   FFlorencia ReasonsMD, PhD, FACP  [facility]  Available via Epic secure chat 7am-7pm for nonurgent issues Please page for urgent issues, pager number available through [email or web address] .   03/31/2020, 1:40 PM  LOS: 204 days

## 2020-04-01 DIAGNOSIS — I619 Nontraumatic intracerebral hemorrhage, unspecified: Secondary | ICD-10-CM | POA: Diagnosis not present

## 2020-04-01 DIAGNOSIS — N179 Acute kidney failure, unspecified: Secondary | ICD-10-CM | POA: Diagnosis not present

## 2020-04-01 DIAGNOSIS — J9601 Acute respiratory failure with hypoxia: Secondary | ICD-10-CM | POA: Diagnosis not present

## 2020-04-01 DIAGNOSIS — I611 Nontraumatic intracerebral hemorrhage in hemisphere, cortical: Secondary | ICD-10-CM | POA: Diagnosis not present

## 2020-04-01 LAB — GLUCOSE, CAPILLARY
Glucose-Capillary: 100 mg/dL — ABNORMAL HIGH (ref 70–99)
Glucose-Capillary: 106 mg/dL — ABNORMAL HIGH (ref 70–99)
Glucose-Capillary: 118 mg/dL — ABNORMAL HIGH (ref 70–99)
Glucose-Capillary: 124 mg/dL — ABNORMAL HIGH (ref 70–99)
Glucose-Capillary: 155 mg/dL — ABNORMAL HIGH (ref 70–99)
Glucose-Capillary: 74 mg/dL (ref 70–99)
Glucose-Capillary: 78 mg/dL (ref 70–99)
Glucose-Capillary: 79 mg/dL (ref 70–99)
Glucose-Capillary: 82 mg/dL (ref 70–99)
Glucose-Capillary: 89 mg/dL (ref 70–99)
Glucose-Capillary: 89 mg/dL (ref 70–99)
Glucose-Capillary: 92 mg/dL (ref 70–99)
Glucose-Capillary: 95 mg/dL (ref 70–99)
Glucose-Capillary: 95 mg/dL (ref 70–99)
Glucose-Capillary: 97 mg/dL (ref 70–99)
Glucose-Capillary: 98 mg/dL (ref 70–99)

## 2020-04-01 NOTE — Progress Notes (Signed)
PROGRESS NOTE  Chad Donoghue LGX:[id number] DOB: October 05, 1981 DOA: 09/09/2019 PCP: Patient, [name]  Brief History   39 year old African-American male admitted by neurosurgery on 09/09/2023 seizure, right hemiplegia and decreased responsiveness found to have left hemispheric intracranial hemorrhage, intubated admitted to ICU. Patient underwent craniotomy for decompression on 09/09/2019 by Dr. Vertell Limber, patient showed slow neurological recovery and prolonged hospitalization due to seizure and aspiration pneumonia. Patient continued to be awake but significant dysphagia, PEG tube was placed on 09/25/2019. Placement has been challenging as patient is uninsured and needs long-term care. Patient vomited tube feeding contents on the night of 01/06/2019. CT abdomen pelvis was negative for obstruction. Currently tolerating tube feeds well. Awaiting placement which is challenging due to the fact that he is uninsured and requires long-term placement.  On 03/17/2020 nursing advised me that they felt that the patient had some chest congestion, although they observed no respiratory distress, and his oxygen saturations were stable. I ordered a portable chest x-ray. However, the patient became combative, so it was cancelled.   On 03/19/2020 the patient's PEG tube sustained damage externally. Nursing has confirmed that it is still functional and can be clamped and flushed without complication. It is still in place.  The patient has been refusing all meds and vitals today.  Consultants  . Trauma . PCCM . Neurosurgery . neurology  Procedures  . Intubation . Extubation . Mechanical ventilation . Craniotomy . PEG tube placement . Tube feeds  Antibiotics   Anti-infectives (From admission, onward)   Start     Dose/Rate Route Frequency Ordered Stop   09/27/19 1130  cephALEXin (KEFLEX) 250 MG/5ML suspension 500 mg     500 mg Per Tube Every 12 hours 09/27/19 1052 09/29/19 2250   09/25/19 1445  ceFAZolin (ANCEF)  IVPB 2g/100 mL premix  Status:  Discontinued     2 g 200 mL/hr over 30 Minutes Intravenous Every 12 hours 09/25/19 1417 09/27/19 1052   09/14/19 1000  Ampicillin-Sulbactam (UNASYN) 3 g in sodium chloride 0.9 % 100 mL IVPB     3 g 200 mL/hr over 30 Minutes Intravenous Every 8 hours 09/14/19 0922 09/17/19 1816   09/13/19 1100  vancomycin (VANCOCIN) IVPB 750 mg/150 ml premix  Status:  Discontinued     750 mg 150 mL/hr over 60 Minutes Intravenous Every 24 hours 09/13/19 0728 09/13/19 0838   09/12/19 1100  vancomycin (VANCOCIN) IVPB 1000 mg/200 mL premix  Status:  Discontinued     1,000 mg 200 mL/hr over 60 Minutes Intravenous Every 24 hours 09/11/19 1034 09/13/19 0728   09/11/19 1130  piperacillin-tazobactam (ZOSYN) IVPB 3.375 g  Status:  Discontinued     3.375 g 12.5 mL/hr over 240 Minutes Intravenous Every 8 hours 09/11/19 1054 09/14/19 0845   09/11/19 1100  vancomycin (VANCOCIN) 1,500 mg in sodium chloride 0.9 % 500 mL IVPB     1,500 mg 250 mL/hr over 120 Minutes Intravenous  Once 09/11/19 1034 09/11/19 1900   09/11/19 1015  piperacillin-tazobactam (ZOSYN) IVPB 4.5 g  Status:  Discontinued     4.5 g 200 mL/hr over 30 Minutes Intravenous Every 8 hours 09/11/19 1010 09/11/19 1052   09/09/19 0845  ceFAZolin (ANCEF) IVPB 2g/100 mL premix     2 g 200 mL/hr over 30 Minutes Intravenous Every 8 hours 09/09/19 0842 09/09/19 1658     Subjective  The patient is lying in bed. He is not verbal. He refuses exam and all interventions. No new complaints.  Objective   Vitals:  Vitals:   04/01/20 0845 04/01/20 1202  BP: 124/83 103/82  Pulse: 72 60  Resp: 18 18  Temp: 98.2 F (36.8 C) 98.2 F (36.8 C)  SpO2: 100% 100%    Exam:  Constitutional:  Appears calm and comfortable Respiratory:  . Respiratory effort normal. No retractions or accessory muscle use Abdomen:  Abdomen appears normal;  Musculoskeletal:  . Digits/nails BUE: no clubbing, cyanosis, petechiae, infection . No edema  apparent Skin:  . No rashes, lesions, ulcers visualized. Neurologic:  . CN 2-12 intact . Sensation all 4 extremities intact . Patient is moving all extremities. Psychiatric:  . Mental status: Oppositional.  I have personally reviewed the following:   Today's Data  . Vitals. Glucoses. Refuses labs or x-rays.  Scheduled Meds: . amLODipine  10 mg Per Tube Daily  . chlorhexidine  15 mL Mouth Rinse BID  . cholecalciferol  1,000 Units Per Tube Daily  . dextrose  25 g Intravenous Once  . enoxaparin (LOVENOX) injection  40 mg Subcutaneous Daily  . feeding supplement (JEVITY 1.2 CAL)  474 mL Per Tube 4x daily  . feeding supplement (PRO-STAT SUGAR FREE 64)  30 mL Per Tube QID  . folic acid  1 mg Per Tube Daily  . free water  200 mL Per Tube Q4H  . hydrALAZINE  25 mg Per Tube Q6H  . insulin aspart  0-15 Units Subcutaneous Q4H  . levETIRAcetam  500 mg Per Tube BID  . mouth rinse  15 mL Mouth Rinse q12n4p  . metoprolol tartrate  25 mg Per Tube BID  . multivitamin  15 mL Per Tube Daily  . polyethylene glycol  17 g Per Tube BID  . QUEtiapine  50 mg Per Tube QHS  . thiamine  100 mg Per Tube Daily   Continuous Infusions:  Principal Problem:   ICH (intracerebral hemorrhage) (HCC) Active Problems:   Subdural hematoma (HCC)   Intracerebral hemorrhage (HCC)   Cytotoxic brain edema (HCC)   Hemorrhagic stroke (HCC)   Acute respiratory failure ([place])   Essential hypertension   Severe protein-calorie malnutrition ([place])   CKD (chronic kidney disease), stage IV (HCC)   Seizure ([place])   AKI (acute kidney injury) ([place])   LOS: 205 days   A & P  Left intracranial hemorrhage: likely from uncontrolled hypertension, status post decompressive craniectomy and hematoma evacuation on 09/09/2019.  CT head showed no vascular malformation.  Dr. Joaquim Lai did not recommend replacement of skull bone with lack of progression. Patient remains aphasic with right hemiplegia and severe cognitive  impairment.  Seizure disorder: Continue Keppra. No recent seizure activity.  Klebsiella UTI: Treatment with cefazolin completed.  Hypertension: Blood pressure is controlled, continue Norvasc, hydralazine, metoprolol.  Chest Congestion: As per observation from nursing.Pt became combative with attempts to obtain portable CXR. Will start Guaifenesin as needed in the meantime. He does not appear to have an acute illness.  Aspiration pneumonia: The patient has completed course of antibiotics.  Nutrition: Patient status post PEG tube placement, on tube feeding. On 03/19/2020 the patient's PEG tube sustained damage externally. Nursing has confirmed that it is still functional and can be clamped and flushed without complication. It is still in place.  CKD stage III: Patient's creatinine was 1.23 as of 01/17/2020.  Pt is refusing further attempts to draw blood.  I have seen and examined this patient myself. I have spent 28 minutes in his evaluation and care.  DVT Prophylaxis: Lovenox CODE STATUS: Full Code Family Communication: None available Disposition:  SNF. Pt homeless. Barrier to discharge: need for long term care eventually, and no funding source. The patient is medically cleared for discharge. No safe discharge.  Maalik Pinn, DO [facility] Direct contact: see [email or web address]  7PM-7AM contact night coverage as above 04/01/2020, 7:15 PM  LOS: 158 days

## 2020-04-01 NOTE — Progress Notes (Signed)
Patient refused all medications from this nurse all day after multiple attempts. Rakita AD gave patient tube feedings this afternoon. Patient was refusing vital signs this afternoon, attempted x2. [place]

## 2020-04-02 DIAGNOSIS — I611 Nontraumatic intracerebral hemorrhage in hemisphere, cortical: Secondary | ICD-10-CM | POA: Diagnosis not present

## 2020-04-02 DIAGNOSIS — J9601 Acute respiratory failure with hypoxia: Secondary | ICD-10-CM | POA: Diagnosis not present

## 2020-04-02 DIAGNOSIS — I619 Nontraumatic intracerebral hemorrhage, unspecified: Secondary | ICD-10-CM | POA: Diagnosis not present

## 2020-04-02 DIAGNOSIS — N179 Acute kidney failure, unspecified: Secondary | ICD-10-CM | POA: Diagnosis not present

## 2020-04-02 LAB — GLUCOSE, CAPILLARY
Glucose-Capillary: 106 mg/dL — ABNORMAL HIGH (ref 70–99)
Glucose-Capillary: 126 mg/dL — ABNORMAL HIGH (ref 70–99)
Glucose-Capillary: 151 mg/dL — ABNORMAL HIGH (ref 70–99)
Glucose-Capillary: 87 mg/dL (ref 70–99)
Glucose-Capillary: 95 mg/dL (ref 70–99)

## 2020-04-02 NOTE — Progress Notes (Signed)
PROGRESS NOTE  Joshua [name] PFX:[id number] DOB: 04-Nov-1981 DOA: 09/09/2019 PCP: Patient, [name]  Brief History   39 year old African-American male admitted by neurosurgery on 09/09/2023 seizure, right hemiplegia and decreased responsiveness found to have left hemispheric intracranial hemorrhage, intubated admitted to ICU. Patient underwent craniotomy for decompression on 09/09/2019 by Dr. Vertell Limber, patient showed slow neurological recovery and prolonged hospitalization due to seizure and aspiration pneumonia. Patient continued to be awake but significant dysphagia, PEG tube was placed on 09/25/2019. Placement has been challenging as patient is uninsured and needs long-term care. Patient vomited tube feeding contents on the night of 01/06/2019. CT abdomen pelvis was negative for obstruction. Currently tolerating tube feeds well. Awaiting placement which is challenging due to the fact that he is uninsured and requires long-term placement.  On 03/17/2020 nursing advised me that they felt that the patient had some chest congestion, although they observed no respiratory distress, and his oxygen saturations were stable. I ordered a portable chest x-ray. However, the patient became combative, so it was cancelled.   On 03/19/2020 the patient's PEG tube sustained damage externally. Nursing has confirmed that it is still functional and can be clamped and flushed without complication. It is still in place.  Consultants  . Trauma . PCCM . Neurosurgery . neurology  Procedures  . Intubation . Extubation . Mechanical ventilation . Craniotomy . PEG tube placement . Tube feeds  Antibiotics   Anti-infectives (From admission, onward)   Start     Dose/Rate Route Frequency Ordered Stop   09/27/19 1130  cephALEXin (KEFLEX) 250 MG/5ML suspension 500 mg     500 mg Per Tube Every 12 hours 09/27/19 1052 09/29/19 2250   09/25/19 1445  ceFAZolin (ANCEF) IVPB 2g/100 mL premix  Status:  Discontinued     2 g 200  mL/hr over 30 Minutes Intravenous Every 12 hours 09/25/19 1417 09/27/19 1052   09/14/19 1000  Ampicillin-Sulbactam (UNASYN) 3 g in sodium chloride 0.9 % 100 mL IVPB     3 g 200 mL/hr over 30 Minutes Intravenous Every 8 hours 09/14/19 0922 09/17/19 1816   09/13/19 1100  vancomycin (VANCOCIN) IVPB 750 mg/150 ml premix  Status:  Discontinued     750 mg 150 mL/hr over 60 Minutes Intravenous Every 24 hours 09/13/19 0728 09/13/19 0838   09/12/19 1100  vancomycin (VANCOCIN) IVPB 1000 mg/200 mL premix  Status:  Discontinued     1,000 mg 200 mL/hr over 60 Minutes Intravenous Every 24 hours 09/11/19 1034 09/13/19 0728   09/11/19 1130  piperacillin-tazobactam (ZOSYN) IVPB 3.375 g  Status:  Discontinued     3.375 g 12.5 mL/hr over 240 Minutes Intravenous Every 8 hours 09/11/19 1054 09/14/19 0845   09/11/19 1100  vancomycin (VANCOCIN) 1,500 mg in sodium chloride 0.9 % 500 mL IVPB     1,500 mg 250 mL/hr over 120 Minutes Intravenous  Once 09/11/19 1034 09/11/19 1900   09/11/19 1015  piperacillin-tazobactam (ZOSYN) IVPB 4.5 g  Status:  Discontinued     4.5 g 200 mL/hr over 30 Minutes Intravenous Every 8 hours 09/11/19 1010 09/11/19 1052   09/09/19 0845  ceFAZolin (ANCEF) IVPB 2g/100 mL premix     2 g 200 mL/hr over 30 Minutes Intravenous Every 8 hours 09/09/19 0842 09/09/19 1658     Subjective  The patient is lying in bed. He is not verbal. He refuses exam and all interventions. No new complaints.  Objective   Vitals:  Vitals:   04/02/20 1319 04/02/20 1605  BP: 104/75 119/86  Pulse: 85 84  Resp: 16 18  Temp: 98.2 F (36.8 C) 98.2 F (36.8 C)  SpO2: 100% 99%    Exam:  Constitutional:  Appears calm and comfortable Respiratory:  . Respiratory effort normal. No retractions or accessory muscle use Abdomen:  Abdomen appears normal;  Musculoskeletal:  . Digits/nails BUE: no clubbing, cyanosis, petechiae, infection . No edema apparent Skin:  . No rashes, lesions, ulcers  visualized. Neurologic:  . CN 2-12 intact . Sensation all 4 extremities intact . Patient is moving all extremities. Psychiatric:  . Mental status: Oppositional.  I have personally reviewed the following:   Today's Data  . Vitals. Glucoses. Refuses labs or x-rays.  Scheduled Meds: . amLODipine  10 mg Per Tube Daily  . chlorhexidine  15 mL Mouth Rinse BID  . cholecalciferol  1,000 Units Per Tube Daily  . dextrose  25 g Intravenous Once  . enoxaparin (LOVENOX) injection  40 mg Subcutaneous Daily  . feeding supplement (JEVITY 1.2 CAL)  474 mL Per Tube 4x daily  . feeding supplement (PRO-STAT SUGAR FREE 64)  30 mL Per Tube QID  . folic acid  1 mg Per Tube Daily  . free water  200 mL Per Tube Q4H  . hydrALAZINE  25 mg Per Tube Q6H  . insulin aspart  0-15 Units Subcutaneous Q4H  . levETIRAcetam  500 mg Per Tube BID  . mouth rinse  15 mL Mouth Rinse q12n4p  . metoprolol tartrate  25 mg Per Tube BID  . multivitamin  15 mL Per Tube Daily  . polyethylene glycol  17 g Per Tube BID  . QUEtiapine  50 mg Per Tube QHS  . thiamine  100 mg Per Tube Daily   Continuous Infusions:  Principal Problem:   ICH (intracerebral hemorrhage) (HCC) Active Problems:   Subdural hematoma (HCC)   Intracerebral hemorrhage (HCC)   Cytotoxic brain edema (HCC)   Hemorrhagic stroke (HCC)   Acute respiratory failure ([place])   Essential hypertension   Severe protein-calorie malnutrition ([place])   CKD (chronic kidney disease), stage IV (HCC)   Seizure ([place])   AKI (acute kidney injury) ([employer])   LOS: 206 days   A & P  Left intracranial hemorrhage: likely from uncontrolled hypertension, status post decompressive craniectomy and hematoma evacuation on 09/09/2019.  CT head showed no vascular malformation.  Dr. Joaquim Lai did not recommend replacement of skull bone with lack of progression. Patient remains aphasic with right hemiplegia and severe cognitive impairment.  Seizure disorder: Continue Keppra. No recent seizure  activity.  Klebsiella UTI: Treatment with cefazolin completed.  Hypertension: Blood pressure is controlled, continue Norvasc, hydralazine, metoprolol.  Chest Congestion: As per observation from nursing.Pt became combative with attempts to obtain portable CXR. Will start Guaifenesin as needed in the meantime. He does not appear to have an acute illness.  Aspiration pneumonia: The patient has completed course of antibiotics.  Nutrition: Patient status post PEG tube placement, on tube feeding. On 03/19/2020 the patient's PEG tube sustained damage externally. Nursing has confirmed that it is still functional and can be clamped and flushed without complication. It is still in place.  CKD stage III: Patient's creatinine was 1.23 as of 01/17/2020.  Pt is refusing further attempts to draw blood.  I have seen and examined this patient myself. I have spent 28 minutes in his evaluation and care.  DVT Prophylaxis: Lovenox CODE STATUS: Full Code Family Communication: None available Disposition: SNF. Pt homeless. Barrier to discharge: need for long term care eventually,  and no funding source. The patient is medically cleared for discharge. No safe discharge.  Zoe Nordin, DO [facility] Direct contact: see [email or web address]  7PM-7AM contact night coverage as above 04/02/2020, 4:46 PM  LOS: 158 days

## 2020-04-02 NOTE — Progress Notes (Signed)
Nutrition Follow-up  DOCUMENTATION CODES:   Severe malnutrition in context of acute illness/injury  INTERVENTION:  ContinueJevity 1.2 [place] PEG at goal volume of 474 ml (2 cartons/ARCs) given QID.   Provide 30 ml ProstatQID per tube.   Free water flushes of200 ml every 4 hours per tube. (MD to adjust as appropriate)  Tube feeding to provide 2675 kcal, 165grams of protein, and2779m free water.  Continue PO intake for comfort as appropriate/tolerated.   NUTRITION DIAGNOSIS:   Severe Malnutrition related to acute illness(intracerebral hemorrage) as evidenced by severe fat depletion, severe muscle depletion.  Ongoing.  GOAL:   Patient will meet greater than or equal to 90% of their needs  Met with TF.   MONITOR:   TF tolerance, Skin, Weight trends, Labs, I & O's  REASON FOR ASSESSMENT:   Ventilator, Consult Enteral/tube feeding initiation and management  ASSESSMENT:   39yo male admitted with large left intracranial hemorrhage; S/P craniotomy. PMH includes HTN, alcoholism, noncompliance.  9/12left frontal temporal craniectomy and evacuation hematoma.  9/23 Extubated.  10/1 PEG placed 11/23- Nutrition Focused Physical Exam performed, severe subcutaneous fat loss and severe muscle wasting identified. 3/23 pt's PEG tube sustained damage externally, but nursing confirmed it is still functional and can be clamped and flushed without complication. It is still in place.    Per MD, placement continues to be challenging as pt is uninsured and needs long-term placement.Pt is nonverbal and refusing exams and medications.   Pt noted to have accepted80%ofone mealover the last week.  No updated weight since last RD assessment.   Medications reviewed and include:Vitamin D3, 4721mJevity 1.2 cal QID, 3030mro-stat QID,Folvite,200m42mee water Q4, Novolog, MVI, Vitamin B1, Miralax  No labs completed since 1/20.  [id number] Order:   Diet  Order            Diet regular Room service appropriate? Yes; Fluid consistency: Thin  Diet effective now              EDUCATION NEEDS:   Not appropriate for education at this time  Skin:  Skin Assessment: Reviewed RN Assessment Skin Integrity Issues:: Other (Comment) Other: skin tear right hip  Last BM:  04/01/20  Height:   Ht Readings from Last 1 Encounters:  09/09/19 5' 9" (1.753 m)    Weight:   Wt Readings from Last 1 Encounters:  03/21/20 63.1 kg    BMI:  Body mass index is 20.54 kg/m.  Estimated Nutritional Needs:   Kcal:  [id number]:  130-170 grams  Fluid:  >/= 2 L/day   AmanLarkin Ina, RD, LDN RD pager number and weekend/on-call pager number located in [place]

## 2020-04-02 NOTE — Plan of Care (Signed)
   Problem: Pain Managment: Goal: General experience of comfort will improve Outcome: Progressing   Problem: Health Behavior/Discharge Planning: Goal: Ability to manage health-related needs will improve Outcome: Progressing

## 2020-04-03 DIAGNOSIS — I611 Nontraumatic intracerebral hemorrhage in hemisphere, cortical: Secondary | ICD-10-CM | POA: Diagnosis not present

## 2020-04-03 DIAGNOSIS — J9601 Acute respiratory failure with hypoxia: Secondary | ICD-10-CM | POA: Diagnosis not present

## 2020-04-03 DIAGNOSIS — I619 Nontraumatic intracerebral hemorrhage, unspecified: Secondary | ICD-10-CM | POA: Diagnosis not present

## 2020-04-03 DIAGNOSIS — N179 Acute kidney failure, unspecified: Secondary | ICD-10-CM | POA: Diagnosis not present

## 2020-04-03 LAB — GLUCOSE, CAPILLARY
Glucose-Capillary: 98 mg/dL (ref 70–99)
Glucose-Capillary: 92 mg/dL (ref 70–99)
Glucose-Capillary: 103 mg/dL — ABNORMAL HIGH (ref 70–99)
Glucose-Capillary: 107 mg/dL — ABNORMAL HIGH (ref 70–99)

## 2020-04-03 NOTE — Progress Notes (Signed)
Despite education by nursing staff, patient refused noon vital signs and blood glucose check.   04/03/20 1230  Level of Consciousness  Level of Consciousness Alert (patient refused vital sign and blood glucose assessment)  MEWS Score  MEWS Temp 0  MEWS Systolic 0  MEWS Pulse 0  MEWS RR 0  MEWS LOC 0  MEWS Score 0  MEWS Score Color Joshua [name]

## 2020-04-03 NOTE — Progress Notes (Signed)
PROGRESS NOTE  Joshua [name] EQA:[id number] DOB: [birth year] DOA: 09/09/2019 PCP: Patient, [name]  Brief History   39 year old African-American male admitted by neurosurgery on 09/09/2023 seizure, right hemiplegia and decreased responsiveness found to have left hemispheric intracranial hemorrhage, intubated admitted to ICU. Patient underwent craniotomy for decompression on 09/09/2019 by Dr. Vertell Limber, patient showed slow neurological recovery and prolonged hospitalization due to seizure and aspiration pneumonia. Patient continued to be awake but significant dysphagia, PEG tube was placed on 09/25/2019. Placement has been challenging as patient is uninsured and needs long-term care. Patient vomited tube feeding contents on the night of 01/06/2019. CT abdomen pelvis was negative for obstruction. Currently tolerating tube feeds well. Awaiting placement which is challenging due to the fact that he is uninsured and requires long-term placement.  On 03/17/2020 nursing advised me that they felt that the patient had some chest congestion, although they observed no respiratory distress, and his oxygen saturations were stable. I ordered a portable chest x-ray. However, the patient became combative, so it was cancelled.   On 03/19/2020 the patient's PEG tube sustained damage externally. Nursing has confirmed that it is still functional and can be clamped and flushed without complication. It is still in place.  Consultants  . Trauma . PCCM . Neurosurgery . neurology  Procedures  . Intubation . Extubation . Mechanical ventilation . Craniotomy . PEG tube placement . Tube feeds  Antibiotics   Anti-infectives (From admission, onward)   Start     Dose/Rate Route Frequency Ordered Stop   09/27/19 1130  cephALEXin (KEFLEX) 250 MG/5ML suspension 500 mg     500 mg Per Tube Every 12 hours 09/27/19 1052 09/29/19 2250   09/25/19 1445  ceFAZolin (ANCEF) IVPB 2g/100 mL premix  Status:  Discontinued     2 g 200  mL/hr over 30 Minutes Intravenous Every 12 hours 09/25/19 1417 09/27/19 1052   09/14/19 1000  Ampicillin-Sulbactam (UNASYN) 3 g in sodium chloride 0.9 % 100 mL IVPB     3 g 200 mL/hr over 30 Minutes Intravenous Every 8 hours 09/14/19 0922 09/17/19 1816   09/13/19 1100  vancomycin (VANCOCIN) IVPB 750 mg/150 ml premix  Status:  Discontinued     750 mg 150 mL/hr over 60 Minutes Intravenous Every 24 hours 09/13/19 0728 09/13/19 0838   09/12/19 1100  vancomycin (VANCOCIN) IVPB 1000 mg/200 mL premix  Status:  Discontinued     1,000 mg 200 mL/hr over 60 Minutes Intravenous Every 24 hours 09/11/19 1034 09/13/19 0728   09/11/19 1130  piperacillin-tazobactam (ZOSYN) IVPB 3.375 g  Status:  Discontinued     3.375 g 12.5 mL/hr over 240 Minutes Intravenous Every 8 hours 09/11/19 1054 09/14/19 0845   09/11/19 1100  vancomycin (VANCOCIN) 1,500 mg in sodium chloride 0.9 % 500 mL IVPB     1,500 mg 250 mL/hr over 120 Minutes Intravenous  Once 09/11/19 1034 09/11/19 1900   09/11/19 1015  piperacillin-tazobactam (ZOSYN) IVPB 4.5 g  Status:  Discontinued     4.5 g 200 mL/hr over 30 Minutes Intravenous Every 8 hours 09/11/19 1010 09/11/19 1052   09/09/19 0845  ceFAZolin (ANCEF) IVPB 2g/100 mL premix     2 g 200 mL/hr over 30 Minutes Intravenous Every 8 hours 09/09/19 0842 09/09/19 1658     Subjective  The patient is lying in bed. He is not verbal. He refuses exam and all interventions. No new complaints.  Objective   Vitals:  Vitals:   04/03/20 0808 04/03/20 1337  BP: 128/88 [name])  130/102  Pulse: 70 85  Resp: 18   Temp: 98 F (36.7 C)   SpO2: 100%     Exam:  Constitutional:  Appears calm and comfortable Respiratory:  . Respiratory effort normal. No retractions or accessory muscle use Abdomen:  Abdomen appears normal;  Musculoskeletal:  . Digits/nails BUE: no clubbing, cyanosis, petechiae, infection . No edema apparent Skin:  . No rashes, lesions, ulcers visualized. Neurologic:  . CN 2-12  intact . Sensation all 4 extremities intact . Patient is moving all extremities. Psychiatric:  . Mental status: Oppositional.  I have personally reviewed the following:   Today's Data  . Vitals. Glucoses. Refuses labs or x-rays.  Scheduled Meds: . amLODipine  10 mg Per Tube Daily  . chlorhexidine  15 mL Mouth Rinse BID  . cholecalciferol  1,000 Units Per Tube Daily  . dextrose  25 g Intravenous Once  . enoxaparin (LOVENOX) injection  40 mg Subcutaneous Daily  . feeding supplement (JEVITY 1.2 CAL)  474 mL Per Tube 4x daily  . feeding supplement (PRO-STAT SUGAR FREE 64)  30 mL Per Tube QID  . folic acid  1 mg Per Tube Daily  . free water  200 mL Per Tube Q4H  . hydrALAZINE  25 mg Per Tube Q6H  . insulin aspart  0-15 Units Subcutaneous Q4H  . levETIRAcetam  500 mg Per Tube BID  . mouth rinse  15 mL Mouth Rinse q12n4p  . metoprolol tartrate  25 mg Per Tube BID  . multivitamin  15 mL Per Tube Daily  . polyethylene glycol  17 g Per Tube BID  . QUEtiapine  50 mg Per Tube QHS  . thiamine  100 mg Per Tube Daily   Continuous Infusions:  Principal Problem:   ICH (intracerebral hemorrhage) (HCC) Active Problems:   Subdural hematoma (HCC)   Intracerebral hemorrhage (HCC)   Cytotoxic brain edema (HCC)   Hemorrhagic stroke (HCC)   Acute respiratory failure ([place])   Essential hypertension   Severe protein-calorie malnutrition ([place])   CKD (chronic kidney disease), stage IV (HCC)   Seizure ([place])   AKI (acute kidney injury) ([place])   LOS: 207 days   A & P  Left intracranial hemorrhage: likely from uncontrolled hypertension, status post decompressive craniectomy and hematoma evacuation on 09/09/2019.  CT head showed no vascular malformation.  Dr. Joaquim Lai did not recommend replacement of skull bone with lack of progression. Patient remains aphasic with right hemiplegia and severe cognitive impairment.  Seizure disorder: Continue Keppra. No recent seizure activity.  Klebsiella UTI:  Treatment with cefazolin completed.  Hypertension: Blood pressure is controlled, continue Norvasc, hydralazine, metoprolol.  Chest Congestion: As per observation from nursing.Pt became combative with attempts to obtain portable CXR. Will start Guaifenesin as needed in the meantime. He does not appear to have an acute illness.  Aspiration pneumonia: The patient has completed course of antibiotics.  Nutrition: Patient status post PEG tube placement, on tube feeding. On 03/19/2020 the patient's PEG tube sustained damage externally. Nursing has confirmed that it is still functional and can be clamped and flushed without complication. It is still in place.  CKD stage III: Patient's creatinine was 1.23 as of 01/17/2020.  Pt is refusing further attempts to draw blood.  I have seen and examined this patient myself. I have spent 28 minutes in his evaluation and care.  DVT Prophylaxis: Lovenox CODE STATUS: Full Code Family Communication: None available Disposition: SNF. Pt homeless. Barrier to discharge: need for long term care eventually, and  no funding source. The patient is medically cleared for discharge. No safe discharge.  Jassmine Vandruff, DO [facility] Direct contact: see [email or web address]  7PM-7AM contact night coverage as above 04/03/2020, 3:11 PM  LOS: 158 days

## 2020-04-04 DIAGNOSIS — I611 Nontraumatic intracerebral hemorrhage in hemisphere, cortical: Secondary | ICD-10-CM | POA: Diagnosis not present

## 2020-04-04 DIAGNOSIS — J9601 Acute respiratory failure with hypoxia: Secondary | ICD-10-CM | POA: Diagnosis not present

## 2020-04-04 DIAGNOSIS — N179 Acute kidney failure, unspecified: Secondary | ICD-10-CM | POA: Diagnosis not present

## 2020-04-04 DIAGNOSIS — I619 Nontraumatic intracerebral hemorrhage, unspecified: Secondary | ICD-10-CM | POA: Diagnosis not present

## 2020-04-04 LAB — GLUCOSE, CAPILLARY
Glucose-Capillary: 107 mg/dL — ABNORMAL HIGH (ref 70–99)
Glucose-Capillary: 112 mg/dL — ABNORMAL HIGH (ref 70–99)
Glucose-Capillary: 117 mg/dL — ABNORMAL HIGH (ref 70–99)
Glucose-Capillary: 128 mg/dL — ABNORMAL HIGH (ref 70–99)
Glucose-Capillary: 94 mg/dL (ref 70–99)
Glucose-Capillary: 99 mg/dL (ref 70–99)

## 2020-04-04 NOTE — Progress Notes (Signed)
PROGRESS NOTE  Joshua [name] IPJ:[id number] DOB: 18-Feb-1981 DOA: 09/09/2019 PCP: Patient, [name]  Brief History   39 year old African-American male admitted by neurosurgery on 09/09/2023 seizure, right hemiplegia and decreased responsiveness found to have left hemispheric intracranial hemorrhage, intubated admitted to ICU. Patient underwent craniotomy for decompression on 09/09/2019 by Dr. Vertell Limber, patient showed slow neurological recovery and prolonged hospitalization due to seizure and aspiration pneumonia. Patient continued to be awake but significant dysphagia, PEG tube was placed on 09/25/2019. Placement has been challenging as patient is uninsured and needs long-term care. Patient vomited tube feeding contents on the night of 01/06/2019. CT abdomen pelvis was negative for obstruction. Currently tolerating tube feeds well. Awaiting placement which is challenging due to the fact that he is uninsured and requires long-term placement.  On 03/17/2020 nursing advised me that they felt that the patient had some chest congestion, although they observed no respiratory distress, and his oxygen saturations were stable. I ordered a portable chest x-ray. However, the patient became combative, so it was cancelled.   On 03/19/2020 the patient's PEG tube sustained damage externally. Nursing has confirmed that it is still functional and can be clamped and flushed without complication. It is still in place.  Consultants  . Trauma . PCCM . Neurosurgery . neurology  Procedures  . Intubation . Extubation . Mechanical ventilation . Craniotomy . PEG tube placement . Tube feeds  Antibiotics   Anti-infectives (From admission, onward)   Start     Dose/Rate Route Frequency Ordered Stop   09/27/19 1130  cephALEXin (KEFLEX) 250 MG/5ML suspension 500 mg     500 mg Per Tube Every 12 hours 09/27/19 1052 09/29/19 2250   09/25/19 1445  ceFAZolin (ANCEF) IVPB 2g/100 mL premix  Status:  Discontinued     2 g 200  mL/hr over 30 Minutes Intravenous Every 12 hours 09/25/19 1417 09/27/19 1052   09/14/19 1000  Ampicillin-Sulbactam (UNASYN) 3 g in sodium chloride 0.9 % 100 mL IVPB     3 g 200 mL/hr over 30 Minutes Intravenous Every 8 hours 09/14/19 0922 09/17/19 1816   09/13/19 1100  vancomycin (VANCOCIN) IVPB 750 mg/150 ml premix  Status:  Discontinued     750 mg 150 mL/hr over 60 Minutes Intravenous Every 24 hours 09/13/19 0728 09/13/19 0838   09/12/19 1100  vancomycin (VANCOCIN) IVPB 1000 mg/200 mL premix  Status:  Discontinued     1,000 mg 200 mL/hr over 60 Minutes Intravenous Every 24 hours 09/11/19 1034 09/13/19 0728   09/11/19 1130  piperacillin-tazobactam (ZOSYN) IVPB 3.375 g  Status:  Discontinued     3.375 g 12.5 mL/hr over 240 Minutes Intravenous Every 8 hours 09/11/19 1054 09/14/19 0845   09/11/19 1100  vancomycin (VANCOCIN) 1,500 mg in sodium chloride 0.9 % 500 mL IVPB     1,500 mg 250 mL/hr over 120 Minutes Intravenous  Once 09/11/19 1034 09/11/19 1900   09/11/19 1015  piperacillin-tazobactam (ZOSYN) IVPB 4.5 g  Status:  Discontinued     4.5 g 200 mL/hr over 30 Minutes Intravenous Every 8 hours 09/11/19 1010 09/11/19 1052   09/09/19 0845  ceFAZolin (ANCEF) IVPB 2g/100 mL premix     2 g 200 mL/hr over 30 Minutes Intravenous Every 8 hours 09/09/19 0842 09/09/19 1658     Subjective  The patient is lying in bed. He is not verbal. He refuses exam and all interventions. No new complaints.  Objective   Vitals:  Vitals:   04/04/20 1010 04/04/20 1232  BP: 119/86 109/77  Pulse: 84 82  Resp: 14 16  Temp: 98.2 F (36.8 C) 98.7 F (37.1 C)  SpO2: 98% 99%    Exam:  Constitutional:  Appears calm and comfortable Respiratory:  . Respiratory effort normal. No retractions or accessory muscle use Abdomen:  Abdomen appears normal;  Musculoskeletal:  . Digits/nails BUE: no clubbing, cyanosis, petechiae, infection . No edema apparent Skin:  . No rashes, lesions, ulcers  visualized. Neurologic:  . CN 2-12 intact . Sensation all 4 extremities intact . Patient is moving all extremities. Psychiatric:  . Mental status: Oppositional.  I have personally reviewed the following:   Today's Data  . Vitals. Glucoses. Refuses labs or x-rays.  Scheduled Meds: . amLODipine  10 mg Per Tube Daily  . chlorhexidine  15 mL Mouth Rinse BID  . cholecalciferol  1,000 Units Per Tube Daily  . dextrose  25 g Intravenous Once  . enoxaparin (LOVENOX) injection  40 mg Subcutaneous Daily  . feeding supplement (JEVITY 1.2 CAL)  474 mL Per Tube 4x daily  . feeding supplement (PRO-STAT SUGAR FREE 64)  30 mL Per Tube QID  . folic acid  1 mg Per Tube Daily  . free water  200 mL Per Tube Q4H  . hydrALAZINE  25 mg Per Tube Q6H  . insulin aspart  0-15 Units Subcutaneous Q4H  . levETIRAcetam  500 mg Per Tube BID  . mouth rinse  15 mL Mouth Rinse q12n4p  . metoprolol tartrate  25 mg Per Tube BID  . multivitamin  15 mL Per Tube Daily  . polyethylene glycol  17 g Per Tube BID  . QUEtiapine  50 mg Per Tube QHS  . thiamine  100 mg Per Tube Daily   Continuous Infusions:  Principal Problem:   ICH (intracerebral hemorrhage) (HCC) Active Problems:   Subdural hematoma (HCC)   Intracerebral hemorrhage (HCC)   Cytotoxic brain edema (HCC)   Hemorrhagic stroke (HCC)   Acute respiratory failure ([place])   Essential hypertension   Severe protein-calorie malnutrition ([place])   CKD (chronic kidney disease), stage IV (HCC)   Seizure ([place])   AKI (acute kidney injury) ([place])   LOS: 208 days   A & P  Left intracranial hemorrhage: likely from uncontrolled hypertension, status post decompressive craniectomy and hematoma evacuation on 09/09/2019.  CT head showed no vascular malformation.  Dr. Joaquim Lai did not recommend replacement of skull bone with lack of progression. Patient remains aphasic with right hemiplegia and severe cognitive impairment.  Seizure disorder: Continue Keppra. No recent seizure  activity.  Klebsiella UTI: Treatment with cefazolin completed.  Hypertension: Blood pressure is controlled, continue Norvasc, hydralazine, metoprolol.  Chest Congestion: As per observation from nursing.Pt became combative with attempts to obtain portable CXR. Will start Guaifenesin as needed in the meantime. He does not appear to have an acute illness.  Aspiration pneumonia: The patient has completed course of antibiotics.  Nutrition: Patient status post PEG tube placement, on tube feeding. On 03/19/2020 the patient's PEG tube sustained damage externally. Nursing has confirmed that it is still functional and can be clamped and flushed without complication. It is still in place.  CKD stage III: Patient's creatinine was 1.23 as of 01/17/2020.  Pt is refusing further attempts to draw blood.  I have seen and examined this patient myself. I have spent 25 minutes in his evaluation and care.  DVT Prophylaxis: Lovenox CODE STATUS: Full Code Family Communication: None available Disposition: SNF. Pt homeless. Barrier to discharge: need for long term care eventually,  and no funding source. The patient is medically cleared for discharge. No safe discharge.  Jens Siems, DO [facility] Direct contact: see [email or web address]  7PM-7AM contact night coverage as above 04/04/2020, 2:42 PM  LOS: 158 days

## 2020-04-05 DIAGNOSIS — I611 Nontraumatic intracerebral hemorrhage in hemisphere, cortical: Secondary | ICD-10-CM | POA: Diagnosis not present

## 2020-04-05 DIAGNOSIS — J9601 Acute respiratory failure with hypoxia: Secondary | ICD-10-CM | POA: Diagnosis not present

## 2020-04-05 DIAGNOSIS — N179 Acute kidney failure, unspecified: Secondary | ICD-10-CM | POA: Diagnosis not present

## 2020-04-05 DIAGNOSIS — I619 Nontraumatic intracerebral hemorrhage, unspecified: Secondary | ICD-10-CM | POA: Diagnosis not present

## 2020-04-05 LAB — GLUCOSE, CAPILLARY
Glucose-Capillary: 107 mg/dL — ABNORMAL HIGH (ref 70–99)
Glucose-Capillary: 100 mg/dL — ABNORMAL HIGH (ref 70–99)
Glucose-Capillary: 111 mg/dL — ABNORMAL HIGH (ref 70–99)
Glucose-Capillary: 105 mg/dL — ABNORMAL HIGH (ref 70–99)
Glucose-Capillary: 106 mg/dL — ABNORMAL HIGH (ref 70–99)

## 2020-04-05 NOTE — Progress Notes (Signed)
PROGRESS NOTE  Joshua [name] GYI:[id number] DOB: 1981/12/12 DOA: 09/09/2019 PCP: Patient, [name]  Brief History   39 year old African-American male admitted by neurosurgery on 09/09/2023 seizure, right hemiplegia and decreased responsiveness found to have left hemispheric intracranial hemorrhage, intubated admitted to ICU. Patient underwent craniotomy for decompression on 09/09/2019 by Dr. Vertell Limber, patient showed slow neurological recovery and prolonged hospitalization due to seizure and aspiration pneumonia. Patient continued to be awake but significant dysphagia, PEG tube was placed on 09/25/2019. Placement has been challenging as patient is uninsured and needs long-term care. Patient vomited tube feeding contents on the night of 01/06/2019. CT abdomen pelvis was negative for obstruction. Currently tolerating tube feeds well. Awaiting placement which is challenging due to the fact that he is uninsured and requires long-term placement.  On 03/17/2020 nursing advised me that they felt that the patient had some chest congestion, although they observed no respiratory distress, and his oxygen saturations were stable. I ordered a portable chest x-ray. However, the patient became combative, so it was cancelled.   On 03/19/2020 the patient's PEG tube sustained damage externally. Nursing has confirmed that it is still functional and can be clamped and flushed without complication. It is still in place.  Consultants  . Trauma . PCCM . Neurosurgery . neurology  Procedures  . Intubation . Extubation . Mechanical ventilation . Craniotomy . PEG tube placement . Tube feeds  Antibiotics   Anti-infectives (From admission, onward)   Start     Dose/Rate Route Frequency Ordered Stop   09/27/19 1130  cephALEXin (KEFLEX) 250 MG/5ML suspension 500 mg     500 mg Per Tube Every 12 hours 09/27/19 1052 09/29/19 2250   09/25/19 1445  ceFAZolin (ANCEF) IVPB 2g/100 mL premix  Status:  Discontinued     2 g 200  mL/hr over 30 Minutes Intravenous Every 12 hours 09/25/19 1417 09/27/19 1052   09/14/19 1000  Ampicillin-Sulbactam (UNASYN) 3 g in sodium chloride 0.9 % 100 mL IVPB     3 g 200 mL/hr over 30 Minutes Intravenous Every 8 hours 09/14/19 0922 09/17/19 1816   09/13/19 1100  vancomycin (VANCOCIN) IVPB 750 mg/150 ml premix  Status:  Discontinued     750 mg 150 mL/hr over 60 Minutes Intravenous Every 24 hours 09/13/19 0728 09/13/19 0838   09/12/19 1100  vancomycin (VANCOCIN) IVPB 1000 mg/200 mL premix  Status:  Discontinued     1,000 mg 200 mL/hr over 60 Minutes Intravenous Every 24 hours 09/11/19 1034 09/13/19 0728   09/11/19 1130  piperacillin-tazobactam (ZOSYN) IVPB 3.375 g  Status:  Discontinued     3.375 g 12.5 mL/hr over 240 Minutes Intravenous Every 8 hours 09/11/19 1054 09/14/19 0845   09/11/19 1100  vancomycin (VANCOCIN) 1,500 mg in sodium chloride 0.9 % 500 mL IVPB     1,500 mg 250 mL/hr over 120 Minutes Intravenous  Once 09/11/19 1034 09/11/19 1900   09/11/19 1015  piperacillin-tazobactam (ZOSYN) IVPB 4.5 g  Status:  Discontinued     4.5 g 200 mL/hr over 30 Minutes Intravenous Every 8 hours 09/11/19 1010 09/11/19 1052   09/09/19 0845  ceFAZolin (ANCEF) IVPB 2g/100 mL premix     2 g 200 mL/hr over 30 Minutes Intravenous Every 8 hours 09/09/19 0842 09/09/19 1658     Subjective  The patient is lying in bed. He is not verbal. He is refusing glucose checks and vitals today.  Objective   Vitals:  Vitals:   04/05/20 0742 04/05/20 1249  BP: (!) 129/95 [name])  131/96  Pulse: 71 88  Resp: 17 18  Temp: (!) 97.4 F (36.3 C) 98.4 F (36.9 C)  SpO2: 99% 99%    Exam:  Constitutional:  Appears calm and comfortable Respiratory:  . Respiratory effort normal. No retractions or accessory muscle use Abdomen:  Abdomen appears normal;  Musculoskeletal:  . Digits/nails BUE: no clubbing, cyanosis, petechiae, infection . No edema apparent Skin:  . No rashes, lesions, ulcers  visualized. Neurologic:  . CN 2-12 intact . Sensation all 4 extremities intact . Patient is moving all extremities. Psychiatric:  . Mental status: Oppositional.  I have personally reviewed the following:   Today's Data  . Refuses labs, glucoses, vital, or x-rays today.  Scheduled Meds: . amLODipine  10 mg Per Tube Daily  . chlorhexidine  15 mL Mouth Rinse BID  . cholecalciferol  1,000 Units Per Tube Daily  . dextrose  25 g Intravenous Once  . enoxaparin (LOVENOX) injection  40 mg Subcutaneous Daily  . feeding supplement (JEVITY 1.2 CAL)  474 mL Per Tube 4x daily  . feeding supplement (PRO-STAT SUGAR FREE 64)  30 mL Per Tube QID  . folic acid  1 mg Per Tube Daily  . free water  200 mL Per Tube Q4H  . hydrALAZINE  25 mg Per Tube Q6H  . insulin aspart  0-15 Units Subcutaneous Q4H  . levETIRAcetam  500 mg Per Tube BID  . mouth rinse  15 mL Mouth Rinse q12n4p  . metoprolol tartrate  25 mg Per Tube BID  . multivitamin  15 mL Per Tube Daily  . polyethylene glycol  17 g Per Tube BID  . QUEtiapine  50 mg Per Tube QHS  . thiamine  100 mg Per Tube Daily   Continuous Infusions:  Principal Problem:   ICH (intracerebral hemorrhage) (HCC) Active Problems:   Subdural hematoma (HCC)   Intracerebral hemorrhage (HCC)   Cytotoxic brain edema (HCC)   Hemorrhagic stroke (HCC)   Acute respiratory failure ([place])   Essential hypertension   Severe protein-calorie malnutrition ([place])   CKD (chronic kidney disease), stage IV (HCC)   Seizure ([place])   AKI (acute kidney injury) ([place])   LOS: 209 days   A & P  Left intracranial hemorrhage: likely from uncontrolled hypertension, status post decompressive craniectomy and hematoma evacuation on 09/09/2019.  CT head showed no vascular malformation.  Dr. Joaquim Lai did not recommend replacement of skull bone with lack of progression. Patient remains aphasic with right hemiplegia and severe cognitive impairment.  Seizure disorder: Continue Keppra. No recent  seizure activity.  Klebsiella UTI: Treatment with cefazolin completed.  Hypertension: Blood pressure is controlled, continue Norvasc, hydralazine, metoprolol.  Chest Congestion: As per observation from nursing.Pt became combative with attempts to obtain portable CXR. Will start Guaifenesin as needed in the meantime. He does not appear to have an acute illness.  Aspiration pneumonia: The patient has completed course of antibiotics.  Nutrition: Patient status post PEG tube placement, on tube feeding. On 03/19/2020 the patient's PEG tube sustained damage externally. Nursing has confirmed that it is still functional and can be clamped and flushed without complication. It is still in place.  CKD stage III: Patient's creatinine was 1.23 as of 01/17/2020.  Pt is refusing further attempts to draw blood.  I have seen and examined this patient myself. I have spent 25 minutes in his evaluation and care.  DVT Prophylaxis: Lovenox CODE STATUS: Full Code Family Communication: None available Disposition: SNF. Pt homeless. Barrier to discharge: need for  long term care eventually, and no funding source. The patient is medically cleared for discharge. No safe discharge.  Jodell Weitman, DO [facility] Direct contact: see [email or web address]  7PM-7AM contact night coverage as above 04/05/2020, 3:19 PM  LOS: 158 days

## 2020-04-05 NOTE — Progress Notes (Signed)
Patient is refusing vitals and glucose check. Will monitor closely. Mikayla Chiusano RN

## 2020-04-06 DIAGNOSIS — N179 Acute kidney failure, unspecified: Secondary | ICD-10-CM | POA: Diagnosis not present

## 2020-04-06 DIAGNOSIS — J9601 Acute respiratory failure with hypoxia: Secondary | ICD-10-CM | POA: Diagnosis not present

## 2020-04-06 DIAGNOSIS — I611 Nontraumatic intracerebral hemorrhage in hemisphere, cortical: Secondary | ICD-10-CM | POA: Diagnosis not present

## 2020-04-06 DIAGNOSIS — I619 Nontraumatic intracerebral hemorrhage, unspecified: Secondary | ICD-10-CM | POA: Diagnosis not present

## 2020-04-06 NOTE — Progress Notes (Signed)
Pulse verified. Pt resting comfortably. Will initiate MEWS protocol.    04/06/20 1940  Vitals  Temp 98.3 F (36.8 C)  BP 114/84  MAP (mmHg) 94  BP Location Left Arm  BP Method Automatic  Patient Position (if appropriate) Lying  Pulse Rate (!) 113  Oxygen Therapy  SpO2 98 %  O2 Device Room Air  MEWS Score  MEWS Temp 0  MEWS Systolic 0  MEWS Pulse 2  MEWS RR 0  MEWS LOC 0  MEWS Score 2  MEWS Score Color Yellow

## 2020-04-06 NOTE — Plan of Care (Signed)
  Problem: Safety: Goal: Ability to remain free from injury will improve Outcome: Progressing   Problem: Skin Integrity: Goal: Risk for impaired skin integrity will decrease Outcome: Progressing   

## 2020-04-06 NOTE — Progress Notes (Signed)
PROGRESS NOTE  Joshua [name] [id number] DOB: [birth year] DOA: 09/09/2019 PCP: Patient, [name]  Brief History   39 year old African-American male admitted by neurosurgery on 09/09/2023 seizure, right hemiplegia and decreased responsiveness found to have left hemispheric intracranial hemorrhage, intubated admitted to ICU. Patient underwent craniotomy for decompression on 09/09/2019 by Dr. Vertell Limber, patient showed slow neurological recovery and prolonged hospitalization due to seizure and aspiration pneumonia. Patient continued to be awake but significant dysphagia, PEG tube was placed on 09/25/2019. Placement has been challenging as patient is uninsured and needs long-term care. Patient vomited tube feeding contents on the night of 01/06/2019. CT abdomen pelvis was negative for obstruction. Currently tolerating tube feeds well. Awaiting placement which is challenging due to the fact that he is uninsured and requires long-term placement.  On 03/17/2020 nursing advised me that they felt that the patient had some chest congestion, although they observed no respiratory distress, and his oxygen saturations were stable. I ordered a portable chest x-ray. However, the patient became combative, so it was cancelled.   On 03/19/2020 the patient's PEG tube sustained damage externally. Nursing has confirmed that it is still functional and can be clamped and flushed without complication. It is still in place.  Consultants  . Trauma . PCCM . Neurosurgery . neurology  Procedures  . Intubation . Extubation . Mechanical ventilation . Craniotomy . PEG tube placement . Tube feeds  Antibiotics   Anti-infectives (From admission, onward)   Start     Dose/Rate Route Frequency Ordered Stop   09/27/19 1130  cephALEXin (KEFLEX) 250 MG/5ML suspension 500 mg     500 mg Per Tube Every 12 hours 09/27/19 1052 09/29/19 2250   09/25/19 1445  ceFAZolin (ANCEF) IVPB 2g/100 mL premix  Status:  Discontinued     2 g 200  mL/hr over 30 Minutes Intravenous Every 12 hours 09/25/19 1417 09/27/19 1052   09/14/19 1000  Ampicillin-Sulbactam (UNASYN) 3 g in sodium chloride 0.9 % 100 mL IVPB     3 g 200 mL/hr over 30 Minutes Intravenous Every 8 hours 09/14/19 0922 09/17/19 1816   09/13/19 1100  vancomycin (VANCOCIN) IVPB 750 mg/150 ml premix  Status:  Discontinued     750 mg 150 mL/hr over 60 Minutes Intravenous Every 24 hours 09/13/19 0728 09/13/19 0838   09/12/19 1100  vancomycin (VANCOCIN) IVPB 1000 mg/200 mL premix  Status:  Discontinued     1,000 mg 200 mL/hr over 60 Minutes Intravenous Every 24 hours 09/11/19 1034 09/13/19 0728   09/11/19 1130  piperacillin-tazobactam (ZOSYN) IVPB 3.375 g  Status:  Discontinued     3.375 g 12.5 mL/hr over 240 Minutes Intravenous Every 8 hours 09/11/19 1054 09/14/19 0845   09/11/19 1100  vancomycin (VANCOCIN) 1,500 mg in sodium chloride 0.9 % 500 mL IVPB     1,500 mg 250 mL/hr over 120 Minutes Intravenous  Once 09/11/19 1034 09/11/19 1900   09/11/19 1015  piperacillin-tazobactam (ZOSYN) IVPB 4.5 g  Status:  Discontinued     4.5 g 200 mL/hr over 30 Minutes Intravenous Every 8 hours 09/11/19 1010 09/11/19 1052   09/09/19 0845  ceFAZolin (ANCEF) IVPB 2g/100 mL premix     2 g 200 mL/hr over 30 Minutes Intravenous Every 8 hours 09/09/19 0842 09/09/19 1658     Subjective  The patient is lying in bed. He is not verbal. His sister is at bedside.  Objective   Vitals:  Vitals:   04/06/20 0819 04/06/20 1215  BP: (!) 152/117 113/76  Pulse: 90  90  Resp: 18 19  Temp: 98.5 F (36.9 C) 97.8 F (36.6 C)  SpO2: 100% 100%    Exam:  Constitutional:  Appears calm and comfortable Respiratory:  . Respiratory effort normal. No retractions or accessory muscle use Abdomen:  Abdomen appears normal;  Musculoskeletal:  . Digits/nails BUE: no clubbing, cyanosis, petechiae, infection . No edema apparent Skin:  . No rashes, lesions, ulcers visualized. Neurologic:  . CN 2-12  intact . Sensation all 4 extremities intact . Patient is moving all extremities. Psychiatric:  . Mental status: Oppositional.  I have personally reviewed the following:   Today's Data  . Refuses labs since January 2021.  Scheduled Meds: . amLODipine  10 mg Per Tube Daily  . chlorhexidine  15 mL Mouth Rinse BID  . cholecalciferol  1,000 Units Per Tube Daily  . dextrose  25 g Intravenous Once  . enoxaparin (LOVENOX) injection  40 mg Subcutaneous Daily  . feeding supplement (JEVITY 1.2 CAL)  474 mL Per Tube 4x daily  . feeding supplement (PRO-STAT SUGAR FREE 64)  30 mL Per Tube QID  . folic acid  1 mg Per Tube Daily  . free water  200 mL Per Tube Q4H  . hydrALAZINE  25 mg Per Tube Q6H  . insulin aspart  0-15 Units Subcutaneous Q4H  . levETIRAcetam  500 mg Per Tube BID  . mouth rinse  15 mL Mouth Rinse q12n4p  . metoprolol tartrate  25 mg Per Tube BID  . multivitamin  15 mL Per Tube Daily  . polyethylene glycol  17 g Per Tube BID  . QUEtiapine  50 mg Per Tube QHS  . thiamine  100 mg Per Tube Daily   Continuous Infusions:  Principal Problem:   ICH (intracerebral hemorrhage) (HCC) Active Problems:   Subdural hematoma (HCC)   Intracerebral hemorrhage (HCC)   Cytotoxic brain edema (HCC)   Hemorrhagic stroke (HCC)   Acute respiratory failure ([place])   Essential hypertension   Severe protein-calorie malnutrition ([place])   CKD (chronic kidney disease), stage IV (HCC)   Seizure ([place])   AKI (acute kidney injury) ([place])   LOS: 210 days   A & P  Left intracranial hemorrhage: likely from uncontrolled hypertension, status post decompressive craniectomy and hematoma evacuation on 09/09/2019.  CT head showed no vascular malformation.  Dr. Joaquim Lai did not recommend replacement of skull bone with lack of progression. Patient remains aphasic with right hemiplegia and severe cognitive impairment.  Seizure disorder: Continue Keppra. No recent seizure activity.  Klebsiella UTI: Treatment with  cefazolin completed.  Hypertension: Blood pressure is controlled, continue Norvasc, hydralazine, metoprolol.  Chest Congestion: As per observation from nursing.Pt became combative with attempts to obtain portable CXR. Will start Guaifenesin as needed in the meantime. He does not appear to have an acute illness.  Aspiration pneumonia: The patient has completed course of antibiotics.  Nutrition: Patient status post PEG tube placement, on tube feeding. On 03/19/2020 the patient's PEG tube sustained damage externally. Nursing has confirmed that it is still functional and can be clamped and flushed without complication. It is still in place.  CKD stage III: Patient's creatinine was 1.23 as of 01/17/2020.  Pt is refusing further attempts to draw blood.  I have seen and examined this patient myself. I have spent 25 minutes in his evaluation and care. I have discussed the patient in detail with his sister who is at bedside. All questions answered to the best of my ability.  DVT Prophylaxis: Lovenox CODE  STATUS: Full Code Family Communication: I have discussed the patient in detail with his sister who is at bedside. All questions answered to the best of my ability. Disposition: SNF. Pt homeless. Barrier to discharge: need for long term care eventually, and no funding source. The patient is medically cleared for discharge. No safe discharge.  Shelbey Spindler, DO [facility] Direct contact: see [email or web address]  7PM-7AM contact night coverage as above 04/06/2020, 4:42 PM  LOS: 158 days

## 2020-04-07 DIAGNOSIS — N179 Acute kidney failure, unspecified: Secondary | ICD-10-CM | POA: Diagnosis not present

## 2020-04-07 DIAGNOSIS — J9601 Acute respiratory failure with hypoxia: Secondary | ICD-10-CM | POA: Diagnosis not present

## 2020-04-07 DIAGNOSIS — I619 Nontraumatic intracerebral hemorrhage, unspecified: Secondary | ICD-10-CM | POA: Diagnosis not present

## 2020-04-07 DIAGNOSIS — I611 Nontraumatic intracerebral hemorrhage in hemisphere, cortical: Secondary | ICD-10-CM | POA: Diagnosis not present

## 2020-04-07 NOTE — Progress Notes (Signed)
PROGRESS NOTE  Joshua [name] [id number]:[id number] DOB: January 29, 1981 DOA: 09/09/2019 PCP: Patient, [name]  Brief History   39 year old African-American male admitted by neurosurgery on 09/09/2023 seizure, right hemiplegia and decreased responsiveness found to have left hemispheric intracranial hemorrhage, intubated admitted to ICU. Patient underwent craniotomy for decompression on 09/09/2019 by Dr. Vertell Limber, patient showed slow neurological recovery and prolonged hospitalization due to seizure and aspiration pneumonia. Patient continued to be awake but significant dysphagia, PEG tube was placed on 09/25/2019. Placement has been challenging as patient is uninsured and needs long-term care. Patient vomited tube feeding contents on the night of 01/06/2019. CT abdomen pelvis was negative for obstruction. Currently tolerating tube feeds well. Awaiting placement which is challenging due to the fact that he is uninsured and requires long-term placement.  On 03/17/2020 nursing advised me that they felt that the patient had some chest congestion, although they observed no respiratory distress, and his oxygen saturations were stable. I ordered a portable chest x-ray. However, the patient became combative, so it was cancelled.   On 03/19/2020 the patient's PEG tube sustained damage externally. Nursing has confirmed that it is still functional and can be clamped and flushed without complication. It is still in place.  Consultants  . Trauma . PCCM . Neurosurgery . neurology  Procedures  . Intubation . Extubation . Mechanical ventilation . Craniotomy . PEG tube placement . Tube feeds  Antibiotics   Anti-infectives (From admission, onward)   Start     Dose/Rate Route Frequency Ordered Stop   09/27/19 1130  cephALEXin (KEFLEX) 250 MG/5ML suspension 500 mg     500 mg Per Tube Every 12 hours 09/27/19 1052 09/29/19 2250   09/25/19 1445  ceFAZolin (ANCEF) IVPB 2g/100 mL premix  Status:  Discontinued     2 g 200  mL/hr over 30 Minutes Intravenous Every 12 hours 09/25/19 1417 09/27/19 1052   09/14/19 1000  Ampicillin-Sulbactam (UNASYN) 3 g in sodium chloride 0.9 % 100 mL IVPB     3 g 200 mL/hr over 30 Minutes Intravenous Every 8 hours 09/14/19 0922 09/17/19 1816   09/13/19 1100  vancomycin (VANCOCIN) IVPB 750 mg/150 ml premix  Status:  Discontinued     750 mg 150 mL/hr over 60 Minutes Intravenous Every 24 hours 09/13/19 0728 09/13/19 0838   09/12/19 1100  vancomycin (VANCOCIN) IVPB 1000 mg/200 mL premix  Status:  Discontinued     1,000 mg 200 mL/hr over 60 Minutes Intravenous Every 24 hours 09/11/19 1034 09/13/19 0728   09/11/19 1130  piperacillin-tazobactam (ZOSYN) IVPB 3.375 g  Status:  Discontinued     3.375 g 12.5 mL/hr over 240 Minutes Intravenous Every 8 hours 09/11/19 1054 09/14/19 0845   09/11/19 1100  vancomycin (VANCOCIN) 1,500 mg in sodium chloride 0.9 % 500 mL IVPB     1,500 mg 250 mL/hr over 120 Minutes Intravenous  Once 09/11/19 1034 09/11/19 1900   09/11/19 1015  piperacillin-tazobactam (ZOSYN) IVPB 4.5 g  Status:  Discontinued     4.5 g 200 mL/hr over 30 Minutes Intravenous Every 8 hours 09/11/19 1010 09/11/19 1052   09/09/19 0845  ceFAZolin (ANCEF) IVPB 2g/100 mL premix     2 g 200 mL/hr over 30 Minutes Intravenous Every 8 hours 09/09/19 0842 09/09/19 1658     Subjective  The patient is lying in bed. He is not verbal.   Objective   Vitals:  Vitals:   04/07/20 0858 04/07/20 1146  BP: 113/78 114/74  Pulse: (!) 104 99  Resp: 18  18  Temp: 97.9 F (36.6 C) 98 F (36.7 C)  SpO2: 97% 98%    Exam:  Constitutional:  Appears calm and comfortable Respiratory:  . Respiratory effort normal. No retractions or accessory muscle use Abdomen:  Abdomen appears normal;  Musculoskeletal:  . Digits/nails BUE: no clubbing, cyanosis, petechiae, infection . No edema apparent Skin:  . No rashes, lesions, ulcers visualized. Neurologic:  . CN 2-12 intact . Sensation all 4  extremities intact . Patient is moving all extremities. Psychiatric:  . Mental status: Oppositional.  I have personally reviewed the following:   Today's Data  . Refuses labs since January 2021.  Scheduled Meds: . amLODipine  10 mg Per Tube Daily  . chlorhexidine  15 mL Mouth Rinse BID  . cholecalciferol  1,000 Units Per Tube Daily  . dextrose  25 g Intravenous Once  . enoxaparin (LOVENOX) injection  40 mg Subcutaneous Daily  . feeding supplement (JEVITY 1.2 CAL)  474 mL Per Tube 4x daily  . feeding supplement (PRO-STAT SUGAR FREE 64)  30 mL Per Tube QID  . folic acid  1 mg Per Tube Daily  . free water  200 mL Per Tube Q4H  . hydrALAZINE  25 mg Per Tube Q6H  . insulin aspart  0-15 Units Subcutaneous Q4H  . levETIRAcetam  500 mg Per Tube BID  . mouth rinse  15 mL Mouth Rinse q12n4p  . metoprolol tartrate  25 mg Per Tube BID  . multivitamin  15 mL Per Tube Daily  . polyethylene glycol  17 g Per Tube BID  . QUEtiapine  50 mg Per Tube QHS  . thiamine  100 mg Per Tube Daily   Principal Problem:   ICH (intracerebral hemorrhage) (HCC) Active Problems:   Subdural hematoma (HCC)   Intracerebral hemorrhage (HCC)   Cytotoxic brain edema (HCC)   Hemorrhagic stroke (HCC)   Acute respiratory failure ([place])   Essential hypertension   Severe protein-calorie malnutrition ([place])   CKD (chronic kidney disease), stage IV (HCC)   Seizure ([place])   AKI (acute kidney injury) ([place])   LOS: 211 days   A & P  Left intracranial hemorrhage: likely from uncontrolled hypertension, status post decompressive craniectomy and hematoma evacuation on 09/09/2019.  CT head showed no vascular malformation.  Dr. Joaquim Lai did not recommend replacement of skull bone with lack of progression. Patient remains aphasic with right hemiplegia and severe cognitive impairment.  Seizure disorder: Continue Keppra. No recent seizure activity.  Klebsiella UTI: Treatment with cefazolin completed.  Hypertension: Blood pressure  is controlled, continue Norvasc, hydralazine, metoprolol.  Chest Congestion: Resolved. .  Aspiration pneumonia: The patient has completed course of antibiotics.  Nutrition: Patient status post PEG tube placement, on tube feeding. On 03/19/2020 the patient's PEG tube sustained damage externally. Nursing has confirmed that it is still functional and can be clamped and flushed without complication. It is still in place.  CKD stage III: Patient's creatinine was 1.23 as of 01/17/2020.  Pt is refusing further attempts to draw blood.  I have seen and examined this patient myself. I have spent 28 minutes in his evaluation and care. I have discussed the patient in detail with his sister who is at bedside. All questions answered to the best of my ability.  DVT Prophylaxis: Lovenox CODE STATUS: Full Code Family Communication: None available. Disposition: SNF. Pt homeless. Barrier to discharge: need for long term care eventually, and no funding source. The patient is medically cleared for discharge. No safe discharge. Due  to the fact that the patient is refusing all interventions/exams yet he is still full code, I have consulted palliative care to discuss goals of care with the patient's family.  Joshua Meidinger, DO [facility] Direct contact: see [email or web address]  7PM-7AM contact night coverage as above 04/07/2020, 2:27 PM  LOS: 158 days

## 2020-04-08 DIAGNOSIS — J9601 Acute respiratory failure with hypoxia: Secondary | ICD-10-CM | POA: Diagnosis not present

## 2020-04-08 DIAGNOSIS — I611 Nontraumatic intracerebral hemorrhage in hemisphere, cortical: Secondary | ICD-10-CM | POA: Diagnosis not present

## 2020-04-08 DIAGNOSIS — I619 Nontraumatic intracerebral hemorrhage, unspecified: Secondary | ICD-10-CM | POA: Diagnosis not present

## 2020-04-08 DIAGNOSIS — N179 Acute kidney failure, unspecified: Secondary | ICD-10-CM | POA: Diagnosis not present

## 2020-04-08 LAB — GLUCOSE, CAPILLARY
Glucose-Capillary: 101 mg/dL — ABNORMAL HIGH (ref 70–99)
Glucose-Capillary: 102 mg/dL — ABNORMAL HIGH (ref 70–99)
Glucose-Capillary: 111 mg/dL — ABNORMAL HIGH (ref 70–99)
Glucose-Capillary: 120 mg/dL — ABNORMAL HIGH (ref 70–99)
Glucose-Capillary: 127 mg/dL — ABNORMAL HIGH (ref 70–99)
Glucose-Capillary: 134 mg/dL — ABNORMAL HIGH (ref 70–99)
Glucose-Capillary: 81 mg/dL (ref 70–99)
Glucose-Capillary: 85 mg/dL (ref 70–99)
Glucose-Capillary: 89 mg/dL (ref 70–99)
Glucose-Capillary: 89 mg/dL (ref 70–99)
Glucose-Capillary: 95 mg/dL (ref 70–99)
Glucose-Capillary: 96 mg/dL (ref 70–99)
Glucose-Capillary: 97 mg/dL (ref 70–99)
Glucose-Capillary: 104 mg/dL — ABNORMAL HIGH (ref 70–99)
Glucose-Capillary: 118 mg/dL — ABNORMAL HIGH (ref 70–99)
Glucose-Capillary: 95 mg/dL (ref 70–99)
Glucose-Capillary: 139 mg/dL — ABNORMAL HIGH (ref 70–99)

## 2020-04-08 NOTE — Progress Notes (Signed)
PROGRESS NOTE  Joshua [name] [id number]:[id number] DOB: 1981-06-10 DOA: 09/09/2019 PCP: Patient, [name]  Brief History   39 year old African-American male admitted by neurosurgery on 09/09/2023 seizure, right hemiplegia and decreased responsiveness found to have left hemispheric intracranial hemorrhage, intubated admitted to ICU. Patient underwent craniotomy for decompression on 09/09/2019 by Dr. Vertell Limber, patient showed slow neurological recovery and prolonged hospitalization due to seizure and aspiration pneumonia. Patient continued to be awake but significant dysphagia, PEG tube was placed on 09/25/2019. Placement has been challenging as patient is uninsured and needs long-term care. Patient vomited tube feeding contents on the night of 01/06/2019. CT abdomen pelvis was negative for obstruction. Currently tolerating tube feeds well. Awaiting placement which is challenging due to the fact that he is uninsured and requires long-term placement.  On 03/17/2020 nursing advised me that they felt that the patient had some chest congestion, although they observed no respiratory distress, and his oxygen saturations were stable. I ordered a portable chest x-ray. However, the patient became combative, so it was cancelled.   On 03/19/2020 the patient's PEG tube sustained damage externally. Nursing has confirmed that it is still functional and can be clamped and flushed without complication. It is still in place.  Palliative care has been consulted to help reconcile the patient's refusal of all interventions or exams with his full code status.  Consultants   Trauma  PCCM  Neurosurgery  neurology  Procedures   Intubation  Extubation  Mechanical ventilation  Craniotomy  PEG tube placement  Tube feeds  Antibiotics   Anti-infectives (From admission, onward)   Start     Dose/Rate Route Frequency Ordered Stop   09/27/19 1130  cephALEXin (KEFLEX) 250 MG/5ML suspension 500 mg     500 mg Per Tube Every  12 hours 09/27/19 1052 09/29/19 2250   09/25/19 1445  ceFAZolin (ANCEF) IVPB 2g/100 mL premix  Status:  Discontinued     2 g 200 mL/hr over 30 Minutes Intravenous Every 12 hours 09/25/19 1417 09/27/19 1052   09/14/19 1000  Ampicillin-Sulbactam (UNASYN) 3 g in sodium chloride 0.9 % 100 mL IVPB     3 g 200 mL/hr over 30 Minutes Intravenous Every 8 hours 09/14/19 0922 09/17/19 1816   09/13/19 1100  vancomycin (VANCOCIN) IVPB 750 mg/150 ml premix  Status:  Discontinued     750 mg 150 mL/hr over 60 Minutes Intravenous Every 24 hours 09/13/19 0728 09/13/19 0838   09/12/19 1100  vancomycin (VANCOCIN) IVPB 1000 mg/200 mL premix  Status:  Discontinued     1,000 mg 200 mL/hr over 60 Minutes Intravenous Every 24 hours 09/11/19 1034 09/13/19 0728   09/11/19 1130  piperacillin-tazobactam (ZOSYN) IVPB 3.375 g  Status:  Discontinued     3.375 g 12.5 mL/hr over 240 Minutes Intravenous Every 8 hours 09/11/19 1054 09/14/19 0845   09/11/19 1100  vancomycin (VANCOCIN) 1,500 mg in sodium chloride 0.9 % 500 mL IVPB     1,500 mg 250 mL/hr over 120 Minutes Intravenous  Once 09/11/19 1034 09/11/19 1900   09/11/19 1015  piperacillin-tazobactam (ZOSYN) IVPB 4.5 g  Status:  Discontinued     4.5 g 200 mL/hr over 30 Minutes Intravenous Every 8 hours 09/11/19 1010 09/11/19 1052   09/09/19 0845  ceFAZolin (ANCEF) IVPB 2g/100 mL premix     2 g 200 mL/hr over 30 Minutes Intravenous Every 8 hours 09/09/19 0842 09/09/19 1658     Subjective  The patient is lying in bed. He is not verbal.   Objective  Vitals:  Vitals:   04/08/20 1210 04/08/20 1241  BP: 99/74 105/76  Pulse: (!) 101 98  Resp: 17 18  Temp: 98.9 F (37.2 C)   SpO2: 99% 96%    Exam:  Constitutional:  Appears calm and comfortable Respiratory:   Respiratory effort normal. No retractions or accessory muscle use Abdomen:  Abdomen appears normal;  Musculoskeletal:   Digits/nails BUE: no clubbing, cyanosis, petechiae, infection  No edema  apparent Skin:   No rashes, lesions, ulcers visualized. Neurologic:   CN 2-12 intact  Sensation all 4 extremities intact  Patient is moving all extremities. Psychiatric:   Mental status: Oppositional.  I have personally reviewed the following:   Today's Data   Refuses labs since January 2021.  Scheduled Meds:  amLODipine  10 mg Per Tube Daily   chlorhexidine  15 mL Mouth Rinse BID   cholecalciferol  1,000 Units Per Tube Daily   dextrose  25 g Intravenous Once   enoxaparin (LOVENOX) injection  40 mg Subcutaneous Daily   feeding supplement (JEVITY 1.2 CAL)  474 mL Per Tube 4x daily   feeding supplement (PRO-STAT SUGAR FREE 64)  30 mL Per Tube QID   folic acid  1 mg Per Tube Daily   free water  200 mL Per Tube Q4H   hydrALAZINE  25 mg Per Tube Q6H   insulin aspart  0-15 Units Subcutaneous Q4H   levETIRAcetam  500 mg Per Tube BID   mouth rinse  15 mL Mouth Rinse q12n4p   metoprolol tartrate  25 mg Per Tube BID   multivitamin  15 mL Per Tube Daily   polyethylene glycol  17 g Per Tube BID   QUEtiapine  50 mg Per Tube QHS   thiamine  100 mg Per Tube Daily   Principal Problem:   ICH (intracerebral hemorrhage) (HCC) Active Problems:   Subdural hematoma (HCC)   Intracerebral hemorrhage (HCC)   Cytotoxic brain edema (HCC)   Hemorrhagic stroke ([place])   Acute respiratory failure ([place])   Essential hypertension   Severe protein-calorie malnutrition ([place])   CKD (chronic kidney disease), stage IV ([place])   Seizure ([place])   AKI (acute kidney injury) ([place])   LOS: 212 days   A & P  Left intracranial hemorrhage: likely from uncontrolled hypertension, status post decompressive craniectomy and hematoma evacuation on 09/09/2019.  CT head showed no vascular malformation.  Dr. Joaquim Lai did not recommend replacement of skull bone with lack of progression. Patient remains aphasic with right hemiplegia and severe cognitive impairment.  Seizure disorder: Continue Keppra. No  recent seizure activity.  Klebsiella UTI: Treatment with cefazolin completed.  Hypertension: Blood pressure is controlled, continue Norvasc, hydralazine, metoprolol.  Chest Congestion: Resolved. .  Aspiration pneumonia: The patient has completed course of antibiotics.  Nutrition: Patient status post PEG tube placement, on tube feeding. On 03/19/2020 the patient's PEG tube sustained damage externally. Nursing has confirmed that it is still functional and can be clamped and flushed without complication. It is still in place.  CKD stage III: Patient's creatinine was 1.23 as of 01/17/2020.  Pt is refusing further attempts to draw blood.  I have seen and examined this patient myself. I have spent 28 minutes in his evaluation and care. I have discussed the patient in detail with his sister who is at bedside. All questions answered to the best of my ability.  DVT Prophylaxis: Lovenox CODE STATUS: Full Code. Palliative care has been consulted to help reconcile the patient's refusal of all interventions  or exams with his full code status. Family Communication: None available. Disposition: SNF. Pt homeless. Barrier to discharge: need for long term care eventually, and no funding source. The patient is medically cleared for discharge. No safe discharge. Due to the fact that the patient is refusing all interventions/exams yet he is still full code, I have consulted palliative care to discuss goals of care with the patient's family.  Dezyrae Kensinger, DO [facility] Direct contact: see [email or web address]  7PM-7AM contact night coverage as above 04/08/2020, 4:17 PM  LOS: 158 days

## 2020-04-09 DIAGNOSIS — J9601 Acute respiratory failure with hypoxia: Secondary | ICD-10-CM | POA: Diagnosis not present

## 2020-04-09 DIAGNOSIS — I619 Nontraumatic intracerebral hemorrhage, unspecified: Secondary | ICD-10-CM | POA: Diagnosis not present

## 2020-04-09 DIAGNOSIS — N179 Acute kidney failure, unspecified: Secondary | ICD-10-CM | POA: Diagnosis not present

## 2020-04-09 DIAGNOSIS — I611 Nontraumatic intracerebral hemorrhage in hemisphere, cortical: Secondary | ICD-10-CM | POA: Diagnosis not present

## 2020-04-09 LAB — GLUCOSE, CAPILLARY
Glucose-Capillary: 129 mg/dL — ABNORMAL HIGH (ref 70–99)
Glucose-Capillary: 181 mg/dL — ABNORMAL HIGH (ref 70–99)
Glucose-Capillary: 188 mg/dL — ABNORMAL HIGH (ref 70–99)
Glucose-Capillary: 87 mg/dL (ref 70–99)
Glucose-Capillary: 88 mg/dL (ref 70–99)
Glucose-Capillary: 88 mg/dL (ref 70–99)
Glucose-Capillary: 98 mg/dL (ref 70–99)

## 2020-04-09 NOTE — Progress Notes (Signed)
PMT consult received and chart reviewed in detail. Discussed with RN and RN CM. Patient opens eyes to voice. He is aphasic. Nods no to some questions. Will not let me perform physical exam. No family at bedside. Called mother, [name] to arrange [place] meeting. No answer. VM left. Awaiting return call. PMT will follow.   NO CHARGE  Ihor Dow, [place], FNP-C Palliative Medicine Team  Phone: [phone] Fax: [phone]

## 2020-04-09 NOTE — Progress Notes (Signed)
Nutrition Follow-up  DOCUMENTATION CODES:   Severe malnutrition in context of acute illness/injury  INTERVENTION:  ContinueJevity 1.2 formulavia PEG at goal volume of 474 ml (2 cartons/ARCs) given QID.   Provide 30 ml ProstatQID per tube.   Free water flushes of200 ml every 4 hours per tube. (MD to adjust as appropriate)  Tube feeding to provide 2675 kcal, 165grams of protein, and2745m free water.  Continue PO intake for comfort as appropriate/tolerated.   NUTRITION DIAGNOSIS:   Severe Malnutrition related to acute illness(intracerebral hemorrage) as evidenced by severe fat depletion, severe muscle depletion.  Ongoing.  GOAL:   Patient will meet greater than or equal to 90% of their needs  Met with TF.  MONITOR:   TF tolerance, Skin, Weight trends, Labs, I & O's  REASON FOR ASSESSMENT:   Ventilator, Consult Enteral/tube feeding initiation and management  ASSESSMENT:   39yo male admitted with large left intracranial hemorrhage; S/P craniotomy. PMH includes HTN, alcoholism, noncompliance.  9/12left frontal temporal craniectomy and evacuation hematoma.  9/23 Extubated.  10/1 PEG placed 11/23- Nutrition Focused Physical Exam performed, severe subcutaneous fat loss and severe muscle wasting identified. 3/23 pt's PEG tube sustained damage externally, but nursing confirmed it is still functional and can be clamped and flushed without complication. It is still in place.    Per MD, placement continues to be challenging as pt is uninsured and needs long-term placement.Pt is nonverbal and refusing exams and medications. Per MD, palliative care has been consulted to help reconcile the patient's refusal of all interventions or exams with his full code status  Pt sleeping at time of RD visit. Discussed pt with RN.   Pt noted to have accepted0%of mealsover the last week.  Admit wt: 63 kg Current wt: 63 kg  Medications reviewed and include:Vitamin  D3, 4740mJevity 1.2 cal QID, 3088mro-stat QID,Folvite,200m60mee water Q4, Novolog, MVI, Vitamin B1, Miralax  No labs completed since 1/20.  [id number] Order:   Diet Order            Diet regular Room service appropriate? Yes; Fluid consistency: Thin  Diet effective now              EDUCATION NEEDS:   Not appropriate for education at this time  Skin:  Skin Assessment: Reviewed RN Assessment Skin Integrity Issues:: Other (Comment) Other: skin tear right hip  Last BM:  4/12  Height:   Ht Readings from Last 1 Encounters:  09/09/19 '5\' 9"'  (1.753 m)    Weight:   Wt Readings from Last 1 Encounters:  04/07/20 63 kg    BMI:  Body mass index is 20.51 kg/m.  Estimated Nutritional Needs:   Kcal:  24001030-1314otein:  130-170 grams  Fluid:  >/= 2 L/day   AmanLarkin Ina, RD, LDN RD pager number and weekend/on-call pager number located in [place]

## 2020-04-09 NOTE — Progress Notes (Signed)
PROGRESS NOTE  Joshua [name] KGY:[id number] DOB: 22-Mar-1981 DOA: 09/09/2019 PCP: Patient, [name]  Brief History   39 year old African-American male admitted by neurosurgery on 09/09/2023 seizure, right hemiplegia and decreased responsiveness found to have left hemispheric intracranial hemorrhage, intubated admitted to ICU. Patient underwent craniotomy for decompression on 09/09/2019 by Dr. Vertell Limber, patient showed slow neurological recovery and prolonged hospitalization due to seizure and aspiration pneumonia. Patient continued to be awake but significant dysphagia, PEG tube was placed on 09/25/2019. Placement has been challenging as patient is uninsured and needs long-term care. Patient vomited tube feeding contents on the night of 01/06/2019. CT abdomen pelvis was negative for obstruction. Currently tolerating tube feeds well. Awaiting placement which is challenging due to the fact that he is uninsured and requires long-term placement.  On 03/17/2020 nursing advised me that they felt that the patient had some chest congestion, although they observed no respiratory distress, and his oxygen saturations were stable. I ordered a portable chest x-ray. However, the patient became combative, so it was cancelled.   On 03/19/2020 the patient's PEG tube sustained damage externally. Nursing has confirmed that it is still functional and can be clamped and flushed without complication. It is still in place.  Palliative care has been consulted to help reconcile the patient's refusal of all interventions or exams with his full code status.  Consultants  . Trauma . PCCM . Neurosurgery . neurology  Procedures  . Intubation . Extubation . Mechanical ventilation . Craniotomy . PEG tube placement . Tube feeds  Antibiotics   Anti-infectives (From admission, onward)   Start     Dose/Rate Route Frequency Ordered Stop   09/27/19 1130  cephALEXin (KEFLEX) 250 MG/5ML suspension 500 mg     500 mg Per Tube Every  12 hours 09/27/19 1052 09/29/19 2250   09/25/19 1445  ceFAZolin (ANCEF) IVPB 2g/100 mL premix  Status:  Discontinued     2 g 200 mL/hr over 30 Minutes Intravenous Every 12 hours 09/25/19 1417 09/27/19 1052   09/14/19 1000  Ampicillin-Sulbactam (UNASYN) 3 g in sodium chloride 0.9 % 100 mL IVPB     3 g 200 mL/hr over 30 Minutes Intravenous Every 8 hours 09/14/19 0922 09/17/19 1816   09/13/19 1100  vancomycin (VANCOCIN) IVPB 750 mg/150 ml premix  Status:  Discontinued     750 mg 150 mL/hr over 60 Minutes Intravenous Every 24 hours 09/13/19 0728 09/13/19 0838   09/12/19 1100  vancomycin (VANCOCIN) IVPB 1000 mg/200 mL premix  Status:  Discontinued     1,000 mg 200 mL/hr over 60 Minutes Intravenous Every 24 hours 09/11/19 1034 09/13/19 0728   09/11/19 1130  piperacillin-tazobactam (ZOSYN) IVPB 3.375 g  Status:  Discontinued     3.375 g 12.5 mL/hr over 240 Minutes Intravenous Every 8 hours 09/11/19 1054 09/14/19 0845   09/11/19 1100  vancomycin (VANCOCIN) 1,500 mg in sodium chloride 0.9 % 500 mL IVPB     1,500 mg 250 mL/hr over 120 Minutes Intravenous  Once 09/11/19 1034 09/11/19 1900   09/11/19 1015  piperacillin-tazobactam (ZOSYN) IVPB 4.5 g  Status:  Discontinued     4.5 g 200 mL/hr over 30 Minutes Intravenous Every 8 hours 09/11/19 1010 09/11/19 1052   09/09/19 0845  ceFAZolin (ANCEF) IVPB 2g/100 mL premix     2 g 200 mL/hr over 30 Minutes Intravenous Every 8 hours 09/09/19 0842 09/09/19 1658     Subjective  The patient is lying in bed. He is not verbal.   Objective  Vitals:  Vitals:   04/09/20 0822 04/09/20 1141  BP: 129/87 (!) 110/93  Pulse: 79 83  Resp: 17 17  Temp: 97.9 F (36.6 C) 97.9 F (36.6 C)  SpO2: 100% 100%    Exam:  Constitutional:  Appears calm and comfortable Respiratory:  . Respiratory effort normal. No retractions or accessory muscle use Abdomen:  Abdomen appears normal;  Musculoskeletal:  . Digits/nails BUE: no clubbing, cyanosis, petechiae,  infection . No edema apparent Skin:  . No rashes, lesions, ulcers visualized. Neurologic:  . CN 2-12 intact . Sensation all 4 extremities intact . Patient is moving all extremities. Psychiatric:  . Mental status: Oppositional.  I have personally reviewed the following:   Today's Data  . Refuses labs since January 2021.  Scheduled Meds: . amLODipine  10 mg Per Tube Daily  . chlorhexidine  15 mL Mouth Rinse BID  . cholecalciferol  1,000 Units Per Tube Daily  . dextrose  25 g Intravenous Once  . enoxaparin (LOVENOX) injection  40 mg Subcutaneous Daily  . feeding supplement (JEVITY 1.2 CAL)  474 mL Per Tube 4x daily  . feeding supplement (PRO-STAT SUGAR FREE 64)  30 mL Per Tube QID  . folic acid  1 mg Per Tube Daily  . free water  200 mL Per Tube Q4H  . hydrALAZINE  25 mg Per Tube Q6H  . insulin aspart  0-15 Units Subcutaneous Q4H  . levETIRAcetam  500 mg Per Tube BID  . mouth rinse  15 mL Mouth Rinse q12n4p  . metoprolol tartrate  25 mg Per Tube BID  . multivitamin  15 mL Per Tube Daily  . polyethylene glycol  17 g Per Tube BID  . QUEtiapine  50 mg Per Tube QHS  . thiamine  100 mg Per Tube Daily   Principal Problem:   ICH (intracerebral hemorrhage) (HCC) Active Problems:   Subdural hematoma (HCC)   Intracerebral hemorrhage (HCC)   Cytotoxic brain edema (HCC)   Hemorrhagic stroke (HCC)   Acute respiratory failure ([place])   Essential hypertension   Severe protein-calorie malnutrition ([place])   CKD (chronic kidney disease), stage IV (HCC)   Seizure ([place])   AKI (acute kidney injury) ([place])   LOS: 213 days   A & P  Left intracranial hemorrhage: likely from uncontrolled hypertension, status post decompressive craniectomy and hematoma evacuation on 09/09/2019.  CT head showed no vascular malformation.  Dr. Joaquim Lai did not recommend replacement of skull bone with lack of progression. Patient remains aphasic with right hemiplegia and severe cognitive impairment.  Seizure disorder:  Continue Keppra. No recent seizure activity.  Klebsiella UTI: Treatment with cefazolin completed.  Hypertension: Blood pressure is controlled, continue Norvasc, hydralazine, metoprolol.  Chest Congestion: Resolved. .  Aspiration pneumonia: The patient has completed course of antibiotics.  Nutrition: Patient status post PEG tube placement, on tube feeding. On 03/19/2020 the patient's PEG tube sustained damage externally. Nursing has confirmed that it is still functional and can be clamped and flushed without complication. It is still in place.  CKD stage III: Patient's creatinine was 1.23 as of 01/17/2020.  Pt is refusing further attempts to draw blood.  I have seen and examined this patient myself. I have spent 28 minutes in his evaluation and care. I have discussed the patient in detail with his sister who is at bedside. All questions answered to the best of my ability.  DVT Prophylaxis: Lovenox CODE STATUS: Full Code. Palliative care has been consulted to help reconcile the patient's refusal  of all interventions or exams with his full code status. Family Communication: None available. Disposition: SNF. Pt homeless. Barrier to discharge: need for long term care eventually, and no funding source. The patient is medically cleared for discharge. No safe discharge. Due to the fact that the patient is refusing all interventions/exams yet he is still full code, I have consulted palliative care to discuss goals of care with the patient's family.  Jubilee Vivero, DO [facility] Direct contact: see [email or web address]  7PM-7AM contact night coverage as above 04/09/2020, 2:25 PM  LOS: 158 days

## 2020-04-10 DIAGNOSIS — N179 Acute kidney failure, unspecified: Secondary | ICD-10-CM | POA: Diagnosis not present

## 2020-04-10 DIAGNOSIS — Z7189 Other specified counseling: Secondary | ICD-10-CM

## 2020-04-10 DIAGNOSIS — E43 Unspecified severe protein-calorie malnutrition: Secondary | ICD-10-CM | POA: Diagnosis not present

## 2020-04-10 DIAGNOSIS — Z931 Gastrostomy status: Secondary | ICD-10-CM

## 2020-04-10 DIAGNOSIS — Z515 Encounter for palliative care: Secondary | ICD-10-CM | POA: Diagnosis not present

## 2020-04-10 DIAGNOSIS — I619 Nontraumatic intracerebral hemorrhage, unspecified: Secondary | ICD-10-CM | POA: Diagnosis not present

## 2020-04-10 DIAGNOSIS — I611 Nontraumatic intracerebral hemorrhage in hemisphere, cortical: Secondary | ICD-10-CM | POA: Diagnosis not present

## 2020-04-10 DIAGNOSIS — J9601 Acute respiratory failure with hypoxia: Secondary | ICD-10-CM | POA: Diagnosis not present

## 2020-04-10 LAB — GLUCOSE, CAPILLARY
Glucose-Capillary: 113 mg/dL — ABNORMAL HIGH (ref 70–99)
Glucose-Capillary: 130 mg/dL — ABNORMAL HIGH (ref 70–99)
Glucose-Capillary: 131 mg/dL — ABNORMAL HIGH (ref 70–99)
Glucose-Capillary: 146 mg/dL — ABNORMAL HIGH (ref 70–99)
Glucose-Capillary: 79 mg/dL (ref 70–99)
Glucose-Capillary: 95 mg/dL (ref 70–99)

## 2020-04-10 NOTE — Progress Notes (Signed)
PMT chart reviewed. Discussed with RN. Called mother, [name] again to arrange [place] meeting. No answer. VM left. Awaiting return call.   NO CHARGE  Ihor Dow, [place], FNP-C Palliative Medicine Team  Phone: [phone] Fax: [phone]

## 2020-04-10 NOTE — Progress Notes (Signed)
Resting comfortably at this time.  Did not refuse any care so far this shift.  Did allow this nurse to give all meds and feedings, as well as he allowed oral care, but not by suction.   He did allow tooth brush to be used.  No signs of distress.

## 2020-04-10 NOTE — Progress Notes (Signed)
PROGRESS NOTE  Joshua [name] ZWC:[id number] DOB: 28-Nov-1981 DOA: 09/09/2019 PCP: Patient, [name]  Brief History   39 year old African-American male admitted by neurosurgery on 09/09/2023 seizure, right hemiplegia and decreased responsiveness found to have left hemispheric intracranial hemorrhage, intubated admitted to ICU. Patient underwent craniotomy for decompression on 09/09/2019 by Dr. Vertell Limber, patient showed slow neurological recovery and prolonged hospitalization due to seizure and aspiration pneumonia. Patient continued to be awake but significant dysphagia, PEG tube was placed on 09/25/2019. Placement has been challenging as patient is uninsured and needs long-term care. Patient vomited tube feeding contents on the night of 01/06/2019. CT abdomen pelvis was negative for obstruction. Currently tolerating tube feeds well. Awaiting placement which is challenging due to the fact that he is uninsured and requires long-term placement.  On 03/17/2020 nursing advised me that they felt that the patient had some chest congestion, although they observed no respiratory distress, and his oxygen saturations were stable. I ordered a portable chest x-ray. However, the patient became combative, so it was cancelled.   On 03/19/2020 the patient's PEG tube sustained damage externally. Nursing has confirmed that it is still functional and can be clamped and flushed without complication. It is still in place.  Palliative care has been consulted to help reconcile the patient's refusal of all interventions or exams with his full code status.  Consultants  . Trauma . PCCM . Neurosurgery . neurology  Procedures  . Intubation . Extubation . Mechanical ventilation . Craniotomy . PEG tube placement . Tube feeds  Antibiotics   Anti-infectives (From admission, onward)   Start     Dose/Rate Route Frequency Ordered Stop   09/27/19 1130  cephALEXin (KEFLEX) 250 MG/5ML suspension 500 mg     500 mg Per Tube Every  12 hours 09/27/19 1052 09/29/19 2250   09/25/19 1445  ceFAZolin (ANCEF) IVPB 2g/100 mL premix  Status:  Discontinued     2 g 200 mL/hr over 30 Minutes Intravenous Every 12 hours 09/25/19 1417 09/27/19 1052   09/14/19 1000  Ampicillin-Sulbactam (UNASYN) 3 g in sodium chloride 0.9 % 100 mL IVPB     3 g 200 mL/hr over 30 Minutes Intravenous Every 8 hours 09/14/19 0922 09/17/19 1816   09/13/19 1100  vancomycin (VANCOCIN) IVPB 750 mg/150 ml premix  Status:  Discontinued     750 mg 150 mL/hr over 60 Minutes Intravenous Every 24 hours 09/13/19 0728 09/13/19 0838   09/12/19 1100  vancomycin (VANCOCIN) IVPB 1000 mg/200 mL premix  Status:  Discontinued     1,000 mg 200 mL/hr over 60 Minutes Intravenous Every 24 hours 09/11/19 1034 09/13/19 0728   09/11/19 1130  piperacillin-tazobactam (ZOSYN) IVPB 3.375 g  Status:  Discontinued     3.375 g 12.5 mL/hr over 240 Minutes Intravenous Every 8 hours 09/11/19 1054 09/14/19 0845   09/11/19 1100  vancomycin (VANCOCIN) 1,500 mg in sodium chloride 0.9 % 500 mL IVPB     1,500 mg 250 mL/hr over 120 Minutes Intravenous  Once 09/11/19 1034 09/11/19 1900   09/11/19 1015  piperacillin-tazobactam (ZOSYN) IVPB 4.5 g  Status:  Discontinued     4.5 g 200 mL/hr over 30 Minutes Intravenous Every 8 hours 09/11/19 1010 09/11/19 1052   09/09/19 0845  ceFAZolin (ANCEF) IVPB 2g/100 mL premix     2 g 200 mL/hr over 30 Minutes Intravenous Every 8 hours 09/09/19 0842 09/09/19 1658     Subjective  The patient is lying in bed. He is not verbal.   Objective  Vitals:  Vitals:   04/10/20 0820 04/10/20 1201  BP: 115/80 101/71  Pulse: 94 96  Resp: 20 19  Temp: 98.4 F (36.9 C) 98.2 F (36.8 C)  SpO2: 100% 100%    Exam:  Constitutional:  Appears calm and comfortable Respiratory:  . Respiratory effort normal. No retractions or accessory muscle use Abdomen:  Abdomen appears normal;  Musculoskeletal:  . Digits/nails BUE: no clubbing, cyanosis, petechiae,  infection . No edema apparent Skin:  . No rashes, lesions, ulcers visualized. Neurologic:  . CN 2-12 intact . Sensation all 4 extremities intact . Patient is moving all extremities. Psychiatric:  . Mental status: Oppositional.  I have personally reviewed the following:   Today's Data  . Refuses labs since January 2021.  Scheduled Meds: . amLODipine  10 mg Per Tube Daily  . chlorhexidine  15 mL Mouth Rinse BID  . cholecalciferol  1,000 Units Per Tube Daily  . dextrose  25 g Intravenous Once  . enoxaparin (LOVENOX) injection  40 mg Subcutaneous Daily  . feeding supplement (JEVITY 1.2 CAL)  474 mL Per Tube 4x daily  . feeding supplement (PRO-STAT SUGAR FREE 64)  30 mL Per Tube QID  . folic acid  1 mg Per Tube Daily  . free water  200 mL Per Tube Q4H  . hydrALAZINE  25 mg Per Tube Q6H  . insulin aspart  0-15 Units Subcutaneous Q4H  . levETIRAcetam  500 mg Per Tube BID  . mouth rinse  15 mL Mouth Rinse q12n4p  . metoprolol tartrate  25 mg Per Tube BID  . multivitamin  15 mL Per Tube Daily  . polyethylene glycol  17 g Per Tube BID  . QUEtiapine  50 mg Per Tube QHS  . thiamine  100 mg Per Tube Daily   Principal Problem:   ICH (intracerebral hemorrhage) (HCC) Active Problems:   Subdural hematoma (HCC)   Intracerebral hemorrhage (HCC)   Cytotoxic brain edema (HCC)   Hemorrhagic stroke (HCC)   Acute respiratory failure ([place])   Essential hypertension   Severe protein-calorie malnutrition ([place])   CKD (chronic kidney disease), stage IV (HCC)   Seizure ([place])   AKI (acute kidney injury) ([place])   LOS: 214 days   A & P  Left intracranial hemorrhage: likely from uncontrolled hypertension, status post decompressive craniectomy and hematoma evacuation on 09/09/2019.  CT head showed no vascular malformation.  Dr. Joaquim Lai did not recommend replacement of skull bone with lack of progression. Patient remains aphasic with right hemiplegia and severe cognitive impairment.  Seizure disorder:  Continue Keppra. No recent seizure activity.  Klebsiella UTI: Treatment with cefazolin completed.  Hypertension: Blood pressure is controlled, continue Norvasc, hydralazine, metoprolol.  Chest Congestion: Resolved. .  Aspiration pneumonia: The patient has completed course of antibiotics.  Nutrition: Patient status post PEG tube placement, on tube feeding. On 03/19/2020 the patient's PEG tube sustained damage externally. Nursing has confirmed that it is still functional and can be clamped and flushed without complication. It is still in place.  CKD stage III: Patient's creatinine was 1.23 as of 01/17/2020.  Pt is refusing further attempts to draw blood.  I have seen and examined this patient myself. I have spent 28 minutes in his evaluation and care. I have discussed the patient in detail with his sister who is at bedside. All questions answered to the best of my ability.  DVT Prophylaxis: Lovenox CODE STATUS: Full Code. Palliative care has been consulted to help reconcile the patient's refusal of  all interventions or exams with his full code status. Family Communication: None available. Disposition: SNF. Pt homeless. Barrier to discharge: need for long term care eventually, and no funding source. The patient is medically cleared for discharge. No safe discharge. Due to the fact that the patient is refusing all interventions/exams yet he is still full code, I have consulted palliative care to discuss goals of care with the patient's family.  Matti Minney, DO [facility] Direct contact: see [email or web address]  7PM-7AM contact night coverage as above 04/10/2020, 3:13 PM  LOS: 158 days

## 2020-04-10 NOTE — Consult Note (Signed)
Consultation Note Date: 04/10/20  [name]  DOB: [birth year]  MRN: [id number]  Age / Sex: 39 y.o., male  PCP: Patient, [name] Referring Physician: Karie Kirks, DO  Reason for Consultation: Establishing goals of care  HPI/Patient Profile: 39 y.o. male  With no past medical history admitted on 09/09/2019 with decreased responsiveness, seizure, right hemiplegia. Found to have left hemispheric intracranial hemorrhage, intubated. Patient underwent left frontoparietal decompressive craniectomy for evacuation of left basal ganglia hematoma with skull flap by Dr. Vertell Limber. Patient with slow neurological recovery and prolonged hospitalization due to seizures, aspiration. PEG placed 09/25/19. Placement has been challenging as patient is uninsured and needs long-term care. Pending disability. Dr. Vertell Limber does not recommend surgery for flap replacement given patient's current state/total care and if he becomes more mobile, flap replacement from abdomen could be an option. Patient intermittently refusing interventions/examinations/medications. Palliative medicine consultation for goals of care.   Clinical Assessment and Goals of Care:  I have reviewed medical records and discussed with care team. Patient intermittently refusing interventions including medications and examinations. He is aphasic. He nods no to some questions. He does not let this NP assess him. Per RN, he did allow her to give medications via PEG today. No oral intake.  No family at bedside.   Received return call from mother, Joshua [name]. She lives in [place], [place] and does not plan to be in [place] this weekend. She works Monday-Friday and also has to work this Saturday. Explained that goals of care can be discussed over the phone.   I introduced Palliative Medicine as specialized medical care for people living with serious illness. It  focuses on providing relief from the symptoms and stress of a serious illness. The goal is to improve quality of life for both the patient and the family.  We discussed a brief life review of the patient. Joshua [name] shares that prior to admission, patient was 'healthy as can be.' She describes him as outgoing and a 'people person,' very respectful towards others. He has a fiance. He has four children, two boys and two girls. Prior to admit, working in Biomedical scientist.   Discussed events leading up to admission and course of extended hospitalization including diagnoses, interventions, plan of care. Explained poor neurological recovery following the bleed, now 7 months later with lack of progression with therapy or cognitive status. Explained that [facility] often refuses interventions, such as medications and physical examinations by the doctor. Mother asks about flap replacement. Reviewed neurosurgery note, sharing they do not recommend replacing flap in his current state/total care.   I attempted to elicit values and goals of care important to the patient. Joshua [name] shares her strong Darrick Meigs faith and belief that "one day he will walk and talk again" and "I believe he can do what he used to do." She wishes for him to be "here as long as he can be here." Spiritual support provided. She shares biggest hope to get him into a nursing facility once Medicaid and disability are approved. Ideally  she would like for him to be closer to family in [place], [place].   Advanced directives and concepts specific to code status were presented. Explaining hoping and praying for the best for Adriene but also preparing for the worst; asking Joshua [name] her thoughts if Tamim took a turn for the worst and needed life-saving measures such as resuscitation or life support. Would she want this for him? Explained that we do not medically recommend aggressive measures in his current state and poor recovery following bleed in September. Joshua [name] becomes tearful. She  states "no, I wouldn't." Joshua [name] is driving home from work right now, and not an ideal time to further discuss MOST form completion with her.    Again, Joshua [name] does not plan to visit [facility] this weekend but shares that she is available Sunday afternoon to follow-up with palliative provider regarding goals of care and MOST form. Texted Joshua [name] MOST form to review. Also provided PMT contact information. Emotional/spiriatul support provided.     SUMMARY OF RECOMMENDATIONS    Continue current plan of care and medical management.  Continue full code status.    Patient's mother Joshua [name]) is Marine scientist. She is not local and unable to visit [place] this weekend. Mother is available for telephone follow-up with PMT provider Sunday afternoon, 4/18. Plan to further discuss MOST form. Mother considering limitations to care.   Code Status/Advance Care Planning:  Full code  Symptom Management:   Per attending  Palliative Prophylaxis:   Aspiration, Delirium Protocol, Oral Care and Turn Reposition  Additional Recommendations (Limitations, Scope, Preferences):  Full Scope Treatment  Psycho-social/Spiritual:   Desire for further Chaplaincy support: yes  Additional Recommendations: Caregiving  Support/Resources  Prognosis:   Unable to determine  Discharge Planning: To Be Determined      Primary Diagnoses: Present on Admission: . ICH (intracerebral hemorrhage) ([place]) . Subdural hematoma ([place]) . Intracerebral hemorrhage ([place]) . Cytotoxic brain edema ([place]) . Hemorrhagic stroke ([place]) . Acute respiratory failure ([place]) . Essential hypertension . CKD (chronic kidney disease), stage IV ([place])   I have reviewed the medical record, interviewed the patient and family, and examined the patient. The following aspects are pertinent.  History reviewed. No pertinent past medical history. Social History   Socioeconomic History  . Marital status: Single    Spouse name: Not on file  . Number of  children: Not on file  . Years of education: Not on file  . Highest education level: Not on file  Occupational History  . Not on file  Tobacco Use  . Smoking status: Former Research scientist (life sciences)  . Smokeless tobacco: Former Network engineer and Sexual Activity  . Alcohol use: Yes  . Drug use: Not Currently  . Sexual activity: Not Currently  Other Topics Concern  . Not on file  Social History Narrative  . Not on file   Social Determinants of Health   Financial Resource Strain: Unknown  . Difficulty of Paying Living Expenses: Patient refused  Food Insecurity: Unknown  . Worried About Charity fundraiser in the Last Year: Patient refused  . Ran Out of Food in the Last Year: Patient refused  Transportation Needs: Unknown  . Lack of Transportation (Medical): Patient refused  . Lack of Transportation (Non-Medical): Patient refused  Physical Activity: Unknown  . Days of Exercise per Week: Patient refused  . Minutes of Exercise per Session: Patient refused  Stress: Unknown  . Feeling of Stress : Patient refused  Social Connections: Unknown  . Frequency of Communication with Friends and  Family: Patient refused  . Frequency of Social Gatherings with Friends and Family: Patient refused  . Attends Religious Services: Patient refused  . Active Member of Clubs or Organizations: Patient refused  . Attends Archivist Meetings: Patient refused  . Marital Status: Patient refused   History reviewed. No pertinent family history. Scheduled Meds: . amLODipine  10 mg Per Tube Daily  . chlorhexidine  15 mL Mouth Rinse BID  . cholecalciferol  1,000 Units Per Tube Daily  . dextrose  25 g Intravenous Once  . enoxaparin (LOVENOX) injection  40 mg Subcutaneous Daily  . feeding supplement (JEVITY 1.2 CAL)  474 mL Per Tube 4x daily  . feeding supplement (PRO-STAT SUGAR FREE 64)  30 mL Per Tube QID  . folic acid  1 mg Per Tube Daily  . free water  200 mL Per Tube Q4H  . hydrALAZINE  25 mg Per Tube Q6H    . insulin aspart  0-15 Units Subcutaneous Q4H  . levETIRAcetam  500 mg Per Tube BID  . mouth rinse  15 mL Mouth Rinse q12n4p  . metoprolol tartrate  25 mg Per Tube BID  . multivitamin  15 mL Per Tube Daily  . polyethylene glycol  17 g Per Tube BID  . QUEtiapine  50 mg Per Tube QHS  . thiamine  100 mg Per Tube Daily   Continuous Infusions: PRN Meds:.acetaminophen **OR** acetaminophen, bisacodyl, glycopyrrolate, lip balm, LORazepam, metoprolol tartrate, [DISCONTINUED] ondansetron **OR** ondansetron (ZOFRAN) IV, promethazine Medications Prior to Admission:  Prior to Admission medications   Medication Sig Start Date End Date Taking? Authorizing Provider  Aspirin-Acetaminophen-Caffeine (GOODY HEADACHE PO) Take 1 packet by mouth every 6 (six) hours as needed (headache/pain).   Yes [provider]  Multiple Vitamin (MULTIVITAMIN WITH MINERALS) TABS tablet Take 1 tablet by mouth daily.   Yes [provider]   No Known Allergies Review of Systems  Unable to perform ROS: Acuity of condition   Physical Exam: patient refused  Vital Signs: BP 117/85 (BP Location: Left Arm)   Pulse 93   Temp 98.2 F (36.8 C) (Oral)   Resp 18   Ht 5\' 9"  (1.753 m)   Wt 63 kg   SpO2 99%   BMI 20.51 kg/m  Pain Scale: 0-10 POSS *See Group Information*: 3-INTERVENTION REQUIRED,Unacceptable,Frequently drowsy, arousable, drifts off to sleep during conversation Pain Score: 0-No pain   SpO2: SpO2: 99 % O2 Device:SpO2: 99 % O2 Flow Rate: .O2 Flow Rate (L/min): 0 L/min  IO: Intake/output summary:   Intake/Output Summary (Last 24 hours) at 04/10/2020 1656 Last data filed at 04/10/2020 1326 Gross per 24 hour  Intake 594 ml  Output --  Net 594 ml    LBM: Last BM Date: 04/09/20 Baseline Weight: Weight: 63 kg Most recent weight: Weight: 63 kg     Palliative Assessment/Data: PPS 30%     Time In: 1615 Time Out: 1705 Time Total: 50 Greater than 50%  of this time was spent counseling and  coordinating care related to the above assessment and plan.  Signed by:  Ihor Dow, DNP, FNP-C Palliative Medicine Team  Phone: [phone] Fax: [phone]   Please contact Palliative Medicine Team phone at [phone] for questions and concerns.  For individual provider: See Shea Evans

## 2020-04-11 DIAGNOSIS — Z515 Encounter for palliative care: Secondary | ICD-10-CM

## 2020-04-11 DIAGNOSIS — I611 Nontraumatic intracerebral hemorrhage in hemisphere, cortical: Secondary | ICD-10-CM | POA: Diagnosis not present

## 2020-04-11 DIAGNOSIS — Z931 Gastrostomy status: Secondary | ICD-10-CM

## 2020-04-11 DIAGNOSIS — Z7189 Other specified counseling: Secondary | ICD-10-CM

## 2020-04-11 LAB — GLUCOSE, CAPILLARY
Glucose-Capillary: 97 mg/dL (ref 70–99)
Glucose-Capillary: 80 mg/dL (ref 70–99)
Glucose-Capillary: 80 mg/dL (ref 70–99)
Glucose-Capillary: 79 mg/dL (ref 70–99)
Glucose-Capillary: 128 mg/dL — ABNORMAL HIGH (ref 70–99)
Glucose-Capillary: 115 mg/dL — ABNORMAL HIGH (ref 70–99)

## 2020-04-11 NOTE — Progress Notes (Signed)
PROGRESS NOTE    Joshua [name]  NIO:[id number] DOB: Aug 10, 1981 DOA: 09/09/2019 PCP: Patient, [name]   Brief Narrative: 39 year old African-American male admitted by neurosurgery on 09/09/2023 seizure, right hemiplegia and decreased responsiveness found to have left hemispheric intracranial hemorrhage, intubated admitted to ICU. Patient underwent craniotomy for decompression on 09/09/2019 by Dr. Vertell Limber, patient showed slow neurological recovery and prolonged hospitalization due to seizure and aspiration pneumonia.Patient continued to be awake but significant dysphagia, PEG tube was placed on 09/25/2019. Placement has been challenging as patient is uninsured and needs long-term care. Patient vomited tube feeding contents on the night of 01/06/2019. CT abdomen pelvis was negative for obstruction. Currently tolerating tube feeds well. Awaiting placement which is challenging due to the fact that he is uninsured and requires long-term placement.  On 03/17/2020 nursing advised me that they felt that the patient had some chest congestion, although they observed no respiratory distress, and his oxygen saturations were stable. I ordered a portable chest x-ray. However, the patient became combative, so it was cancelled.   On 03/19/2020 the patient's PEG tube sustained damage externally. Nursing has confirmed that it is still functional and can be clamped and flushed without complication. It is still in place.  Palliative care has been consulted to help reconcile the patient's refusal of all interventions or exams with his full code status.  Assessment & Plan:   Principal Problem:   ICH (intracerebral hemorrhage) (HCC) Active Problems:   Subdural hematoma (HCC)   Intracerebral hemorrhage (HCC)   Cytotoxic brain edema (HCC)   Hemorrhagic stroke (HCC)   Acute respiratory failure ([place])   Essential hypertension   Severe protein-calorie malnutrition (HCC)   CKD (chronic kidney disease), stage IV (HCC)  Seizure (HCC)   AKI (acute kidney injury) ([place])   PEG (percutaneous endoscopic gastrostomy) status ([place])   Palliative care by specialist   Goals of care, counseling/discussion  Left intracranial hemorrhage: likely from uncontrolled hypertension, status post decompressive craniectomy and hematoma evacuation on 09/09/2019. CT head showed no vascular malformation. Dr. Joaquim Lai did not recommend replacement of skull bone with lack of progression. Patient remains aphasic with right hemiplegia and severe cognitive impairment.  Seizure disorder: Continue Keppra.No recent seizure activity.  Klebsiella UTI: Treatment with cefazolin completed.  Hypertension: Blood pressure is controlled, continue Norvasc, hydralazine, metoprolol.  Chest Congestion: Resolved. .  Aspiration pneumonia: The patient has completed course of antibiotics.  Nutrition: Patient status post PEG tube placement, on tube feeding. On 03/19/2020 the patient's PEG tube sustained damage externally. Nursing has confirmed that it is still functional and can be clamped and flushed without complication. It is still in place.  CKD stage III: Patient's creatinine was 1.23 as of 01/17/2020. Pt is refusing further attempts to draw blood.   DVT Prophylaxis: Lovenox CODE STATUS: Full Code. Palliative care has been consulted to help reconcile the patient's refusal of all interventions or exams with his full code status. Family Communication: None available. Disposition: SNF. Pt homeless. Barrier to discharge: need for long term care eventually, and no funding source. The patient is medically cleared for discharge. No safe discharge. Due to the fact that the patient is refusing all interventions/exams yet he is still full code, I have consulted palliative care to discuss goals of care with the patient's family.    Consultants:   Trauma  PCCM  Neurosurgery  Neurology  Procedures:   Antimicrobials:  Anti-infectives (From  admission, onward)   Start     Dose/Rate Route Frequency Ordered Stop  09/27/19 1130  cephALEXin (KEFLEX) 250 MG/5ML suspension 500 mg     500 mg Per Tube Every 12 hours 09/27/19 1052 09/29/19 2250   09/25/19 1445  ceFAZolin (ANCEF) IVPB 2g/100 mL premix  Status:  Discontinued     2 g 200 mL/hr over 30 Minutes Intravenous Every 12 hours 09/25/19 1417 09/27/19 1052   09/14/19 1000  Ampicillin-Sulbactam (UNASYN) 3 g in sodium chloride 0.9 % 100 mL IVPB     3 g 200 mL/hr over 30 Minutes Intravenous Every 8 hours 09/14/19 0922 09/17/19 1816   09/13/19 1100  vancomycin (VANCOCIN) IVPB 750 mg/150 ml premix  Status:  Discontinued     750 mg 150 mL/hr over 60 Minutes Intravenous Every 24 hours 09/13/19 0728 09/13/19 0838   09/12/19 1100  vancomycin (VANCOCIN) IVPB 1000 mg/200 mL premix  Status:  Discontinued     1,000 mg 200 mL/hr over 60 Minutes Intravenous Every 24 hours 09/11/19 1034 09/13/19 0728   09/11/19 1130  piperacillin-tazobactam (ZOSYN) IVPB 3.375 g  Status:  Discontinued     3.375 g 12.5 mL/hr over 240 Minutes Intravenous Every 8 hours 09/11/19 1054 09/14/19 0845   09/11/19 1100  vancomycin (VANCOCIN) 1,500 mg in sodium chloride 0.9 % 500 mL IVPB     1,500 mg 250 mL/hr over 120 Minutes Intravenous  Once 09/11/19 1034 09/11/19 1900   09/11/19 1015  piperacillin-tazobactam (ZOSYN) IVPB 4.5 g  Status:  Discontinued     4.5 g 200 mL/hr over 30 Minutes Intravenous Every 8 hours 09/11/19 1010 09/11/19 1052   09/09/19 0845  ceFAZolin (ANCEF) IVPB 2g/100 mL premix     2 g 200 mL/hr over 30 Minutes Intravenous Every 8 hours 09/09/19 0842 09/09/19 1658      Subjective: Patient is seen at bedside, he is at his baseline, patient is nonverbal.  Alert opens eyes.  Objective: Vitals:   04/10/20 1954 04/10/20 2321 04/11/20 0408 04/11/20 0813  BP: 111/89 105/82 113/85 110/86  Pulse: (!) 106 93 82 84  Resp: 16 20 20 16   Temp: 98.6 F (37 C) 98.3 F (36.8 C) 98.4 F (36.9 C) (!) 97.4 F  (36.3 C)  TempSrc: Oral   Oral  SpO2: 97% 94% 98% 98%  Weight:      Height:        Intake/Output Summary (Last 24 hours) at 04/11/2020 1417 Last data filed at 04/10/2020 1813 Gross per 24 hour  Intake 474 ml  Output --  Net 474 ml   Filed Weights   03/17/20 0500 03/21/20 0338 04/07/20 0340  Weight: 62 kg 63.1 kg 63 kg    Examination:  General exam: Appears calm and comfortable  Respiratory system: Clear to auscultation. Respiratory effort normal. Cardiovascular system: S1 & S2 heard, RRR. No JVD, murmurs, rubs, gallops or clicks. No pedal edema. Gastrointestinal system: Abdomen is nondistended, soft and nontender. No organomegaly or masses felt. Normal bowel sounds heard. Central nervous system: Alert and oriented. No focal neurological deficits. Extremities:  Not assessed. Skin: No rashes, lesions or ulcers Psychiatry: Judgement and insight appear normal. Mood & affect appropriate.     Data Reviewed: I have personally reviewed following labs and imaging studies  CBC: No results for input(s): WBC, NEUTROABS, HGB, HCT, MCV, PLT in the last 168 hours. Basic Metabolic Panel: No results for input(s): NA, K, CL, CO2, GLUCOSE, BUN, CREATININE, CALCIUM, MG, PHOS in the last 168 hours. GFR: CrCl cannot be calculated (Patient's most recent lab result is older than the maximum  21 days allowed.). Liver Function Tests: No results for input(s): AST, ALT, ALKPHOS, BILITOT, PROT, ALBUMIN in the last 168 hours. No results for input(s): LIPASE, AMYLASE in the last 168 hours. No results for input(s): AMMONIA in the last 168 hours. Coagulation Profile: No results for input(s): INR, PROTIME in the last 168 hours. Cardiac Enzymes: No results for input(s): CKTOTAL, CKMB, CKMBINDEX, TROPONINI in the last 168 hours. BNP (last 3 results) No results for input(s): PROBNP in the last 8760 hours. HbA1C: No results for input(s): HGBA1C in the last 72 hours. CBG: Recent Labs  Lab 04/10/20 0818  04/10/20 1158 04/10/20 1612 04/10/20 1934 04/10/20 2356  GLUCAP 79 113* 130* 131* 146*   Lipid Profile: No results for input(s): CHOL, HDL, LDLCALC, TRIG, CHOLHDL, LDLDIRECT in the last 72 hours. Thyroid Function Tests: No results for input(s): TSH, T4TOTAL, FREET4, T3FREE, THYROIDAB in the last 72 hours. Anemia Panel: No results for input(s): VITAMINB12, FOLATE, FERRITIN, TIBC, IRON, RETICCTPCT in the last 72 hours. Sepsis Labs: No results for input(s): PROCALCITON, LATICACIDVEN in the last 168 hours.  No results found for this or any previous visit (from the past 240 hour(s)).    Radiology Studies: No results found.   Scheduled Meds: . amLODipine  10 mg Per Tube Daily  . chlorhexidine  15 mL Mouth Rinse BID  . cholecalciferol  1,000 Units Per Tube Daily  . dextrose  25 g Intravenous Once  . enoxaparin (LOVENOX) injection  40 mg Subcutaneous Daily  . feeding supplement (JEVITY 1.2 CAL)  474 mL Per Tube 4x daily  . feeding supplement (PRO-STAT SUGAR FREE 64)  30 mL Per Tube QID  . folic acid  1 mg Per Tube Daily  . free water  200 mL Per Tube Q4H  . hydrALAZINE  25 mg Per Tube Q6H  . insulin aspart  0-15 Units Subcutaneous Q4H  . levETIRAcetam  500 mg Per Tube BID  . mouth rinse  15 mL Mouth Rinse q12n4p  . metoprolol tartrate  25 mg Per Tube BID  . multivitamin  15 mL Per Tube Daily  . polyethylene glycol  17 g Per Tube BID  . QUEtiapine  50 mg Per Tube QHS  . thiamine  100 mg Per Tube Daily   Continuous Infusions:   LOS: 215 days    Time spent: 25 min    Khye Hochstetler, MD [facility]   If 7PM-7AM, please contact night-coverage

## 2020-04-12 DIAGNOSIS — I611 Nontraumatic intracerebral hemorrhage in hemisphere, cortical: Secondary | ICD-10-CM | POA: Diagnosis not present

## 2020-04-12 LAB — GLUCOSE, CAPILLARY
Glucose-Capillary: 102 mg/dL — ABNORMAL HIGH (ref 70–99)
Glucose-Capillary: 121 mg/dL — ABNORMAL HIGH (ref 70–99)
Glucose-Capillary: 122 mg/dL — ABNORMAL HIGH (ref 70–99)
Glucose-Capillary: 143 mg/dL — ABNORMAL HIGH (ref 70–99)
Glucose-Capillary: 82 mg/dL (ref 70–99)
Glucose-Capillary: 93 mg/dL (ref 70–99)

## 2020-04-12 NOTE — Progress Notes (Signed)
PROGRESS NOTE    Joshua [name]  GGY:[id number] DOB: [birth year] DOA: 09/09/2019 PCP: Joshua [name], [name]   Brief Narrative: 39 year old African-American male admitted by neurosurgery on 09/09/2023 for seizure, right hemiplegia and decreased responsiveness found to have left hemispheric intracranial hemorrhage, intubated admitted to ICU. Joshua [name] underwent craniotomy for decompression on 09/09/2019 by Dr. Vertell Limber, Joshua [name] showed slow neurological recovery and prolonged hospitalization due to seizure and aspiration pneumonia.Joshua [name] continued to be awake but significant dysphagia, PEG tube was placed on 09/25/2019. Placement has been challenging as Joshua [name] is uninsured and needs long-term care. Joshua [name] vomited tube feeding contents on the night of 01/06/2019. CT abdomen pelvis was negative for obstruction. Currently tolerating tube feeds well. Awaiting placement which is challenging due to the fact that he is uninsured and requires long-term placement.  On 03/17/2020 nursing advised me that they felt that the Joshua [name] had some chest congestion, although they observed no respiratory distress, and his oxygen saturations were stable. I ordered a portable chest x-ray. However, the Joshua [name] became combative, so it was cancelled.   On 03/19/2020 the Joshua [name]'s PEG tube sustained damage externally. Nursing has confirmed that it is still functional and can be clamped and flushed without complication. It is still in place.  Palliative care has been consulted to help reconcile the Joshua [name]'s refusal of all interventions or exams with his full code status.  Assessment & Plan:   Principal Problem:   ICH (intracerebral hemorrhage) (HCC) Active Problems:   Subdural hematoma (HCC)   Intracerebral hemorrhage (HCC)   Cytotoxic brain edema (HCC)   Hemorrhagic stroke (HCC)   Acute respiratory failure ([place])   Essential hypertension   Severe protein-calorie malnutrition (HCC)   CKD (chronic kidney disease), stage IV  (HCC)   Seizure (HCC)   AKI (acute kidney injury) ([place])   PEG (percutaneous endoscopic gastrostomy) status ([place])   Palliative care by specialist   Goals of care, counseling/discussion  Left intracranial hemorrhage: likely from uncontrolled hypertension, status post decompressive craniectomy and hematoma evacuation on 09/09/2019. CT head showed no vascular malformation. Dr. Joaquim Lai did not recommend replacement of skull bone with lack of progression. Joshua [name] remains aphasic with right hemiplegia and severe cognitive impairment.  Seizure disorder: Continue Keppra.No recent seizure activity.  Klebsiella UTI: Treatment with cefazolin completed.  Hypertension: Blood pressure is controlled, continue Norvasc, hydralazine, metoprolol.  Chest Congestion: Resolved. .  Aspiration pneumonia: The Joshua [name] has completed course of antibiotics.  Nutrition: Joshua [name] status post PEG tube placement, on tube feeding. On 03/19/2020 the Joshua [name]'s PEG tube sustained damage externally. Nursing has confirmed that it is still functional and can be clamped and flushed without complication. It is still in place.  CKD stage III: Joshua [name]'s creatinine was 1.23 as of 01/17/2020. Pt is refusing further attempts to draw blood.   DVT Prophylaxis: Lovenox CODE STATUS: Full Code. Palliative care has been consulted to help reconcile the Joshua [name]'s refusal of all interventions or exams with his full code status. Family Communication: None available. Disposition: SNF. Pt homeless. Barrier to discharge: need for long term care eventually, and no funding source. The Joshua [name] is medically cleared for discharge. No safe discharge. Due to the fact that the Joshua [name] is refusing all interventions/exams yet he is still full code, I have consulted palliative care to discuss goals of care with the Joshua [name]'s family.    Consultants:   Trauma  PCCM  Neurosurgery  Neurology  Procedures:   Antimicrobials:  Anti-infectives  (From admission, onward)   Start     Dose/Rate Route Frequency Ordered  Stop   09/27/19 1130  cephALEXin (KEFLEX) 250 MG/5ML suspension 500 mg     500 mg Per Tube Every 12 hours 09/27/19 1052 09/29/19 2250   09/25/19 1445  ceFAZolin (ANCEF) IVPB 2g/100 mL premix  Status:  Discontinued     2 g 200 mL/hr over 30 Minutes Intravenous Every 12 hours 09/25/19 1417 09/27/19 1052   09/14/19 1000  Ampicillin-Sulbactam (UNASYN) 3 g in sodium chloride 0.9 % 100 mL IVPB     3 g 200 mL/hr over 30 Minutes Intravenous Every 8 hours 09/14/19 0922 09/17/19 1816   09/13/19 1100  vancomycin (VANCOCIN) IVPB 750 mg/150 ml premix  Status:  Discontinued     750 mg 150 mL/hr over 60 Minutes Intravenous Every 24 hours 09/13/19 0728 09/13/19 0838   09/12/19 1100  vancomycin (VANCOCIN) IVPB 1000 mg/200 mL premix  Status:  Discontinued     1,000 mg 200 mL/hr over 60 Minutes Intravenous Every 24 hours 09/11/19 1034 09/13/19 0728   09/11/19 1130  piperacillin-tazobactam (ZOSYN) IVPB 3.375 g  Status:  Discontinued     3.375 g 12.5 mL/hr over 240 Minutes Intravenous Every 8 hours 09/11/19 1054 09/14/19 0845   09/11/19 1100  vancomycin (VANCOCIN) 1,500 mg in sodium chloride 0.9 % 500 mL IVPB     1,500 mg 250 mL/hr over 120 Minutes Intravenous  Once 09/11/19 1034 09/11/19 1900   09/11/19 1015  piperacillin-tazobactam (ZOSYN) IVPB 4.5 g  Status:  Discontinued     4.5 g 200 mL/hr over 30 Minutes Intravenous Every 8 hours 09/11/19 1010 09/11/19 1052   09/09/19 0845  ceFAZolin (ANCEF) IVPB 2g/100 mL premix     2 g 200 mL/hr over 30 Minutes Intravenous Every 8 hours 09/09/19 0842 09/09/19 1658      Subjective: Joshua [name] is seen at bedside, he is at his baseline, Joshua [name] is nonverbal.  Alert opens eyes.  Objective: Vitals:   04/12/20 0100 04/12/20 0456 04/12/20 0832 04/12/20 1137  BP: 119/86 122/89 (!) 120/97 (!) 123/97  Pulse: 84 90 77 87  Resp: 18 16 17 17   Temp: 97.9 F (36.6 C) 98 F (36.7 C) 98 F (36.7 C) 97.8  F (36.6 C)  TempSrc: Oral Oral Oral Oral  SpO2: 99% 97% 100% 99%  Weight:      Height:       No intake or output data in the 24 hours ending 04/12/20 1332 Filed Weights   03/17/20 0500 03/21/20 0338 04/07/20 0340  Weight: 62 kg 63.1 kg 63 kg    Examination:  General exam: Appears calm and comfortable  Respiratory system: Clear to auscultation. Respiratory effort normal. Cardiovascular system: S1 & S2 heard, RRR. No JVD, murmurs, rubs, gallops or clicks. No pedal edema. Gastrointestinal system: Abdomen is nondistended, soft and nontender. Peg tube in place. Central nervous system: Alert and oriented. No focal neurological deficits. Extremities:  Not assessed. Skin: No rashes, lesions or ulcers Psychiatry: Judgement and insight appear normal. Mood & affect appropriate.     Data Reviewed: I have personally reviewed following labs and imaging studies  CBC: No results for input(s): WBC, NEUTROABS, HGB, HCT, MCV, PLT in the last 168 hours. Basic Metabolic Panel: No results for input(s): NA, K, CL, CO2, GLUCOSE, BUN, CREATININE, CALCIUM, MG, PHOS in the last 168 hours. GFR: CrCl cannot be calculated (Joshua [name]'s most recent lab result is older than the maximum 21 days allowed.). Liver Function Tests: No results for input(s): AST, ALT, ALKPHOS, BILITOT, PROT, ALBUMIN in the last 168 hours. No  results for input(s): LIPASE, AMYLASE in the last 168 hours. No results for input(s): AMMONIA in the last 168 hours. Coagulation Profile: No results for input(s): INR, PROTIME in the last 168 hours. Cardiac Enzymes: No results for input(s): CKTOTAL, CKMB, CKMBINDEX, TROPONINI in the last 168 hours. BNP (last 3 results) No results for input(s): PROBNP in the last 8760 hours. HbA1C: No results for input(s): HGBA1C in the last 72 hours. CBG: Recent Labs  Lab 04/11/20 2035 04/11/20 2319 04/12/20 0408 04/12/20 0825 04/12/20 1140  GLUCAP 128* 115* 102* 82 122*   Lipid Profile: No  results for input(s): CHOL, HDL, LDLCALC, TRIG, CHOLHDL, LDLDIRECT in the last 72 hours. Thyroid Function Tests: No results for input(s): TSH, T4TOTAL, FREET4, T3FREE, THYROIDAB in the last 72 hours. Anemia Panel: No results for input(s): VITAMINB12, FOLATE, FERRITIN, TIBC, IRON, RETICCTPCT in the last 72 hours. Sepsis Labs: No results for input(s): PROCALCITON, LATICACIDVEN in the last 168 hours.  No results found for this or any previous visit (from the past 240 hour(s)).    Radiology Studies: No results found.   Scheduled Meds: . amLODipine  10 mg Per Tube Daily  . chlorhexidine  15 mL Mouth Rinse BID  . cholecalciferol  1,000 Units Per Tube Daily  . dextrose  25 g Intravenous Once  . enoxaparin (LOVENOX) injection  40 mg Subcutaneous Daily  . feeding supplement (JEVITY 1.2 CAL)  474 mL Per Tube 4x daily  . feeding supplement (PRO-STAT SUGAR FREE 64)  30 mL Per Tube QID  . folic acid  1 mg Per Tube Daily  . free water  200 mL Per Tube Q4H  . hydrALAZINE  25 mg Per Tube Q6H  . insulin aspart  0-15 Units Subcutaneous Q4H  . levETIRAcetam  500 mg Per Tube BID  . mouth rinse  15 mL Mouth Rinse q12n4p  . metoprolol tartrate  25 mg Per Tube BID  . multivitamin  15 mL Per Tube Daily  . polyethylene glycol  17 g Per Tube BID  . QUEtiapine  50 mg Per Tube QHS  . thiamine  100 mg Per Tube Daily   Continuous Infusions:   LOS: 216 days    Time spent: 25 min    Ikechukwu Cerny, MD [facility]   If 7PM-7AM, please contact night-coverage

## 2020-04-13 DIAGNOSIS — Z515 Encounter for palliative care: Secondary | ICD-10-CM | POA: Diagnosis not present

## 2020-04-13 DIAGNOSIS — I611 Nontraumatic intracerebral hemorrhage in hemisphere, cortical: Secondary | ICD-10-CM | POA: Diagnosis not present

## 2020-04-13 DIAGNOSIS — Z7189 Other specified counseling: Secondary | ICD-10-CM | POA: Diagnosis not present

## 2020-04-13 NOTE — Progress Notes (Signed)
PROGRESS NOTE    Terel Bann  ZSW:[id number] DOB: [birth year] DOA: 09/09/2019 PCP: Patient, [name]   Brief Narrative: 39 year old African-American male admitted by neurosurgery on 09/09/2023 for seizure, right hemiplegia and decreased responsiveness found to have left hemispheric intracranial hemorrhage, intubated admitted to ICU. Patient underwent craniotomy for decompression on 09/09/2019 by Dr. Vertell Limber, patient showed slow neurological recovery and prolonged hospitalization due to seizure and aspiration pneumonia.Patient continued to be awake but significant dysphagia, PEG tube was placed on 09/25/2019. Placement has been challenging as patient is uninsured and needs long-term care. Patient vomited tube feeding contents on the night of 01/06/2019. CT abdomen pelvis was negative for obstruction. Currently tolerating tube feeds well. Awaiting placement which is challenging due to the fact that he is uninsured and requires long-term placement.  On 03/17/2020 nursing advised me that they felt that the patient had some chest congestion, although they observed no respiratory distress, and his oxygen saturations were stable. I ordered a portable chest x-ray. However, the patient became combative, so it was cancelled.   On 03/19/2020 the patient's PEG tube sustained damage externally. Nursing has confirmed that it is still functional and can be clamped and flushed without complication. It is still in place.  Palliative care has been consulted to help reconcile the patient's refusal of all interventions or exams with his full code status.  Assessment & Plan:   Principal Problem:   ICH (intracerebral hemorrhage) (HCC) Active Problems:   Subdural hematoma (HCC)   Intracerebral hemorrhage (HCC)   Cytotoxic brain edema (HCC)   Hemorrhagic stroke (HCC)   Acute respiratory failure ([place])   Essential hypertension   Severe protein-calorie malnutrition (HCC)   CKD (chronic kidney disease), stage IV  (HCC)   Seizure (HCC)   AKI (acute kidney injury) ([place])   PEG (percutaneous endoscopic gastrostomy) status ([place])   Palliative care by specialist   Goals of care, counseling/discussion  Left intracranial hemorrhage: likely from uncontrolled hypertension, status post decompressive craniectomy and hematoma evacuation on 09/09/2019. CT head showed no vascular malformation. Dr. Joaquim Lai did not recommend replacement of skull bone with lack of progression. Patient remains aphasic with right hemiplegia and severe cognitive impairment.  Seizure disorder: Continue Keppra.No recent seizure activity.  Klebsiella UTI: Treatment with cefazolin completed.  Hypertension: Blood pressure is controlled, continue Norvasc, hydralazine, metoprolol.  Chest Congestion: Resolved. .  Aspiration pneumonia: The patient has completed course of antibiotics.  Nutrition: Patient status post PEG tube placement, on tube feeding. On 03/19/2020 the patient's PEG tube sustained damage externally. Nursing has confirmed that it is still functional and can be clamped and flushed without complication. It is still in place.  CKD stage III: Patient's creatinine was 1.23 as of 01/17/2020. Pt is refusing further attempts to draw blood.   DVT Prophylaxis: Lovenox CODE STATUS: Full Code. Palliative care has been consulted to help reconcile the patient's refusal of all interventions or exams with his full code status. Family Communication: None available. Disposition: SNF. Pt homeless. Barrier to discharge: need for long term care eventually, and no funding source. The patient is medically cleared for discharge. No safe discharge. Due to the fact that the patient is refusing all interventions/exams yet he is still full code, I have consulted palliative care to discuss goals of care with the patient's family.    Consultants:   Trauma  PCCM  Neurosurgery  Neurology  Procedures:   Antimicrobials:  Anti-infectives  (From admission, onward)   Start     Dose/Rate Route Frequency Ordered  Stop   09/27/19 1130  cephALEXin (KEFLEX) 250 MG/5ML suspension 500 mg     500 mg Per Tube Every 12 hours 09/27/19 1052 09/29/19 2250   09/25/19 1445  ceFAZolin (ANCEF) IVPB 2g/100 mL premix  Status:  Discontinued     2 g 200 mL/hr over 30 Minutes Intravenous Every 12 hours 09/25/19 1417 09/27/19 1052   09/14/19 1000  Ampicillin-Sulbactam (UNASYN) 3 g in sodium chloride 0.9 % 100 mL IVPB     3 g 200 mL/hr over 30 Minutes Intravenous Every 8 hours 09/14/19 0922 09/17/19 1816   09/13/19 1100  vancomycin (VANCOCIN) IVPB 750 mg/150 ml premix  Status:  Discontinued     750 mg 150 mL/hr over 60 Minutes Intravenous Every 24 hours 09/13/19 0728 09/13/19 0838   09/12/19 1100  vancomycin (VANCOCIN) IVPB 1000 mg/200 mL premix  Status:  Discontinued     1,000 mg 200 mL/hr over 60 Minutes Intravenous Every 24 hours 09/11/19 1034 09/13/19 0728   09/11/19 1130  piperacillin-tazobactam (ZOSYN) IVPB 3.375 g  Status:  Discontinued     3.375 g 12.5 mL/hr over 240 Minutes Intravenous Every 8 hours 09/11/19 1054 09/14/19 0845   09/11/19 1100  vancomycin (VANCOCIN) 1,500 mg in sodium chloride 0.9 % 500 mL IVPB     1,500 mg 250 mL/hr over 120 Minutes Intravenous  Once 09/11/19 1034 09/11/19 1900   09/11/19 1015  piperacillin-tazobactam (ZOSYN) IVPB 4.5 g  Status:  Discontinued     4.5 g 200 mL/hr over 30 Minutes Intravenous Every 8 hours 09/11/19 1010 09/11/19 1052   09/09/19 0845  ceFAZolin (ANCEF) IVPB 2g/100 mL premix     2 g 200 mL/hr over 30 Minutes Intravenous Every 8 hours 09/09/19 0842 09/09/19 1658      Subjective: Patient is seen at bedside, He is at his baseline, patient is nonverbal.  Alert opens eyes. He has taken his medications.  Objective: Vitals:   04/13/20 0405 04/13/20 0534 04/13/20 0821 04/13/20 1301  BP: (!) 115/100 (!) 117/98 116/86 103/73  Pulse: 88  87 99  Resp: 18  17 17   Temp: 98 F (36.7 C)  97.7 F  (36.5 C) 98.1 F (36.7 C)  TempSrc: Oral  Oral Oral  SpO2: 99%  97% 97%  Weight:      Height:       No intake or output data in the 24 hours ending 04/13/20 1340 Filed Weights   03/17/20 0500 03/21/20 0338 04/07/20 0340  Weight: 62 kg 63.1 kg 63 kg    Examination:  General exam: Appears calm and comfortable  Respiratory system: Clear to auscultation. Respiratory effort normal. Cardiovascular system: S1 & S2 heard, RRR. No JVD, murmurs, rubs, gallops or clicks. No pedal edema. Gastrointestinal system: Abdomen is nondistended, soft and nontender. Peg tube in place. Central nervous system: Alert and oriented. No focal neurological deficits. Extremities:  Not assessed. Skin: No rashes, lesions or ulcers Psychiatry: Judgement and insight appear normal. Mood & affect appropriate.     Data Reviewed: I have personally reviewed following labs and imaging studies  CBC: No results for input(s): WBC, NEUTROABS, HGB, HCT, MCV, PLT in the last 168 hours. Basic Metabolic Panel: No results for input(s): NA, K, CL, CO2, GLUCOSE, BUN, CREATININE, CALCIUM, MG, PHOS in the last 168 hours. GFR: CrCl cannot be calculated (Patient's most recent lab result is older than the maximum 21 days allowed.). Liver Function Tests: No results for input(s): AST, ALT, ALKPHOS, BILITOT, PROT, ALBUMIN in the last 168  hours. No results for input(s): LIPASE, AMYLASE in the last 168 hours. No results for input(s): AMMONIA in the last 168 hours. Coagulation Profile: No results for input(s): INR, PROTIME in the last 168 hours. Cardiac Enzymes: No results for input(s): CKTOTAL, CKMB, CKMBINDEX, TROPONINI in the last 168 hours. BNP (last 3 results) No results for input(s): PROBNP in the last 8760 hours. HbA1C: No results for input(s): HGBA1C in the last 72 hours. CBG: Recent Labs  Lab 04/12/20 0825 04/12/20 1140 04/12/20 1609 04/12/20 2047 04/12/20 2347  GLUCAP 82 122* 121* 93 143*   Lipid Profile: No  results for input(s): CHOL, HDL, LDLCALC, TRIG, CHOLHDL, LDLDIRECT in the last 72 hours. Thyroid Function Tests: No results for input(s): TSH, T4TOTAL, FREET4, T3FREE, THYROIDAB in the last 72 hours. Anemia Panel: No results for input(s): VITAMINB12, FOLATE, FERRITIN, TIBC, IRON, RETICCTPCT in the last 72 hours. Sepsis Labs: No results for input(s): PROCALCITON, LATICACIDVEN in the last 168 hours.  No results found for this or any previous visit (from the past 240 hour(s)).    Radiology Studies: No results found.   Scheduled Meds: . amLODipine  10 mg Per Tube Daily  . chlorhexidine  15 mL Mouth Rinse BID  . cholecalciferol  1,000 Units Per Tube Daily  . dextrose  25 g Intravenous Once  . enoxaparin (LOVENOX) injection  40 mg Subcutaneous Daily  . feeding supplement (JEVITY 1.2 CAL)  474 mL Per Tube 4x daily  . feeding supplement (PRO-STAT SUGAR FREE 64)  30 mL Per Tube QID  . folic acid  1 mg Per Tube Daily  . free water  200 mL Per Tube Q4H  . hydrALAZINE  25 mg Per Tube Q6H  . insulin aspart  0-15 Units Subcutaneous Q4H  . levETIRAcetam  500 mg Per Tube BID  . mouth rinse  15 mL Mouth Rinse q12n4p  . metoprolol tartrate  25 mg Per Tube BID  . multivitamin  15 mL Per Tube Daily  . polyethylene glycol  17 g Per Tube BID  . QUEtiapine  50 mg Per Tube QHS  . thiamine  100 mg Per Tube Daily   Continuous Infusions:   LOS: 217 days    Time spent: 25 min    Ibrahem Volkman, MD [facility]   If 7PM-7AM, please contact night-coverage

## 2020-04-13 NOTE — Progress Notes (Signed)
   Palliative Medicine Inpatient Follow Up Note   HPI: 39 y.o. male  With no past medical history admitted on 09/09/2019 with decreased responsiveness, seizure, right hemiplegia. Found to have left hemispheric intracranial hemorrhage, intubated. Patient underwent left frontoparietal decompressive craniectomy for evacuation of left basal ganglia hematoma with skull flap by Dr. Vertell Limber. Patient with slow neurological recovery and prolonged hospitalization due to seizures, aspiration. PEG placed 09/25/19. Placement has been challenging as patient is uninsured and needs long-term care. Pending disability. Dr. Vertell Limber does not recommend surgery for flap replacement given patient's current state/total care and if he becomes more mobile, flap replacement from abdomen could be an option. Patient intermittently refusing interventions/examinations/medications. Palliative medicine consultation for goals of care.   Today's Discussion (04/13/2020): Chart reviewed. I met with Elberta Fortis at bedside. He was nonverbal. He shook his head no when I asked him how he was. I asked him if there is anything I can offer to help him and he again shook his head no. It is unclear how much he may understand based upon the responses I received.   I called Champ's mother, [name] to remind her of the meeting arranged for tomorrow at 1400. I was able to leave a VM.  Questions and concerns addressed   Vital Signs Vitals:   04/13/20 1301 04/13/20 1630  BP: 103/73 (!) 127/92  Pulse: 99 (!) 102  Resp: 17 17  Temp: 98.1 F (36.7 C) 98 F (36.7 C)  SpO2: 97% 97%   No intake or output data in the 24 hours ending 04/13/20 1751 Last Weight  Most recent update: 04/07/2020  4:34 AM   Weight  63 kg (138 lb 14.2 oz)           SUMMARY OF RECOMMENDATIONS   Continue current plan of care and medical management Continue full code status.   Patient's mother [name]) is decision-maker, plan to discuss MOST tomorrow at 1400  Time Spent:  25 Greater than 50% of the time was spent in counseling and coordination of care ______________________________________________________________________________________ [place] Team Team Cell Phone: [phone] Please utilize secure chat with additional questions, if there is no response within 30 minutes please call the above phone number  Palliative Medicine Team providers are available by phone from 7am to 7pm daily and can be reached through the team cell phone.  Should this patient require assistance outside of these hours, please call the patient's attending physician.

## 2020-04-14 DIAGNOSIS — I611 Nontraumatic intracerebral hemorrhage in hemisphere, cortical: Secondary | ICD-10-CM | POA: Diagnosis not present

## 2020-04-14 DIAGNOSIS — Z7189 Other specified counseling: Secondary | ICD-10-CM | POA: Diagnosis not present

## 2020-04-14 DIAGNOSIS — Z515 Encounter for palliative care: Secondary | ICD-10-CM | POA: Diagnosis not present

## 2020-04-14 NOTE — TOC Progression Note (Signed)
Transition of Care [facility]) - Progression Note    Patient Details  Name: Joshua [name] MRN: [id number] Date of Birth: [birth year]  Transition of Care [facility]) CM/SW [place], [place] Phone Number: 04/14/2020, 2:36 PM  Clinical Narrative:   CSW alerted by RN that patient's brother at the desk asking about letters from [place] that were sent to the patient. CSW reviewed documentation, and told the brother that the family would have to reach out to who sent the letter, that it was not something that CSW had involvement in. Brother indicated that family would contact [place].     Expected Discharge Plan: Skilled Nursing Facility Barriers to Discharge: SNF Pending payor source - [employer], SNF Pending bed offer, Inadequate or no insurance  Expected Discharge Plan and Services Expected Discharge Plan: [place] In-house Referral: Clinical Social Work Discharge Planning Services: NA Post Acute Care Choice: [place] Living arrangements for the past 2 months: Apartment                 DME Arranged: N/A DME Agency: NA       HH Arranged: NA HH Agency: NA         Social Determinants of Health (SDOH) Interventions    Readmission Risk Interventions No flowsheet data found.

## 2020-04-14 NOTE — Progress Notes (Addendum)
   Palliative Medicine Inpatient Follow Up Note   HPI: 39 y.o. male  With no past medical history admitted on 09/09/2019 with decreased responsiveness, seizure, right hemiplegia. Found to have left hemispheric intracranial hemorrhage, intubated. Patient underwent left frontoparietal decompressive craniectomy for evacuation of left basal ganglia hematoma with skull flap by Dr. Vertell Limber. Patient with slow neurological recovery and prolonged hospitalization due to seizures, aspiration. PEG placed 09/25/19. Placement has been challenging as patient is uninsured and needs long-term care. Pending disability. Dr. Vertell Limber does not recommend surgery for flap replacement given patient's current state/total care and if he becomes more mobile, flap replacement from abdomen could be an option. Patient intermittently refusing interventions/examinations/medications. Palliative medicine consultation for goals of care.   Today's Discussion (04/14/2020): Chart reviewed. I met with Joshua [name] at bedside. He was nonverbal.Per review of notes he remains to refuse treatments intermittently.  I called Joshua [name], Joshua [name] and reviewed the MOST form she shared that she is not ready to make a decision right now and wants to discuss this topic with her other children. I offered to walk them through the MOST form via telephone if need be as she had expressed inability to print it out at her home.   Addendum: [name], Joshua [name] called me back and shared that she would like to do any and everything to maintain Joshua [name]'s life. She is not interested in discussing DNR status any further and wants full resuscitation.   Questions and concerns addressed   Vital Signs Vitals:   04/14/20 0338 04/14/20 0826  BP: 115/89 110/88  Pulse: 92 90  Resp: 17 16  Temp: 97.8 F (36.6 C) 97.8 F (36.6 C)  SpO2: 97% 97%    Intake/Output Summary (Last 24 hours) at 04/14/2020 1353 Last data filed at 04/14/2020 5956 Gross per 24 hour  Intake 200 ml   Output --  Net 200 ml   Last Weight  Most recent update: 04/07/2020  4:34 AM   Weight  63 kg (138 lb 14.2 oz)          SUMMARY OF RECOMMENDATIONS   Continue current plan of care and medical management  Continue full code status, per family they would like all measures to preserve life  Time Spent: 25 Greater than 50% of the time was spent in counseling and coordination of care ______________________________________________________________________________________ [place] Team Team Cell Phone: [phone] Please utilize secure chat with additional questions, if there is no response within 30 minutes please call the above phone number  Palliative Medicine Team providers are available by phone from 7am to 7pm daily and can be reached through the team cell phone.  Should this patient require assistance outside of these hours, please call the patient's attending physician.

## 2020-04-14 NOTE — Progress Notes (Signed)
Patient refused insulin administration and oral care but was okay with the feedings, no other complications noted during this shift

## 2020-04-14 NOTE — Progress Notes (Signed)
PROGRESS NOTE    Joshua [name]  KGU:[id number] DOB: 01-28-1981 DOA: 09/09/2019 PCP: Patient, [name]   Brief Narrative: 39 year old African-American male admitted by neurosurgery on 09/09/2023 for seizure, right hemiplegia and decreased responsiveness found to have left hemispheric intracranial hemorrhage, intubated admitted to ICU. Patient underwent craniotomy for decompression on 09/09/2019 by Dr. Vertell Limber, patient showed slow neurological recovery and prolonged hospitalization due to seizure and aspiration pneumonia.Patient continued to be awake but significant dysphagia, PEG tube was placed on 09/25/2019. Placement has been challenging as patient is uninsured and needs long-term care. Patient vomited tube feeding contents on the night of 01/06/2019. CT abdomen pelvis was negative for obstruction. Currently tolerating tube feeds well. Awaiting placement which is challenging due to the fact that he is uninsured and requires long-term placement.  On 03/17/2020 nursing advised me that they felt that the patient had some chest congestion, although they observed no respiratory distress, and his oxygen saturations were stable. I ordered a portable chest x-ray. However, the patient became combative, so it was cancelled.   On 03/19/2020 the patient's PEG tube sustained damage externally. Nursing has confirmed that it is still functional and can be clamped and flushed without complication. It is still in place.  Palliative care has been consulted to help reconcile the patient's refusal of all interventions or exams with his full code status.  Assessment & Plan:   Principal Problem:   ICH (intracerebral hemorrhage) (HCC) Active Problems:   Subdural hematoma (HCC)   Intracerebral hemorrhage (HCC)   Cytotoxic brain edema (HCC)   Hemorrhagic stroke (HCC)   Acute respiratory failure ([place])   Essential hypertension   Severe protein-calorie malnutrition (HCC)   CKD (chronic kidney disease), stage IV  (HCC)   Seizure (HCC)   AKI (acute kidney injury) ([place])   PEG (percutaneous endoscopic gastrostomy) status ([place])   Palliative care by specialist   Goals of care, counseling/discussion  Left intracranial hemorrhage: likely from uncontrolled hypertension, status post decompressive craniectomy and hematoma evacuation on 09/09/2019. CT head showed no vascular malformation. Dr. Joaquim Lai did not recommend replacement of skull bone with lack of progression. Patient remains aphasic with right hemiplegia and severe cognitive impairment.  Seizure disorder: Continue Keppra.No recent seizure activity.  Klebsiella UTI: Treatment with cefazolin,  completed.  Hypertension: Blood pressure is controlled, continue Norvasc, hydralazine, metoprolol.  Chest Congestion: Resolved. .  Aspiration pneumonia: The patient has completed course of antibiotics.  Nutrition: Patient status post PEG tube placement, on tube feeding. On 03/19/2020 the patient's PEG tube sustained damage externally. Nursing has confirmed that it is still functional and can be clamped and flushed without complication. It is still in place.  CKD stage III: Patient's creatinine was 1.23 as of 01/17/2020. Pt is refusing further attempts to draw blood.   DVT Prophylaxis: Lovenox CODE STATUS: Full Code. Palliative care has been consulted to help reconcile the patient's refusal of all interventions or exams with his full code status. Family Communication: None available. Disposition: SNF. Pt homeless.  Barrier to discharge: Need for long term care eventually, and no funding source. The patient is medically cleared for discharge. No safe discharge. Due to the fact that the patient is refusing all interventions/exams yet he is still full code, I have consulted palliative care to discuss goals of care with the patient's family.    Consultants:   Trauma  PCCM  Neurosurgery  Neurology  Procedures:   Antimicrobials:    Anti-infectives (From admission, onward)   Start     Dose/Rate  Route Frequency Ordered Stop   09/27/19 1130  cephALEXin (KEFLEX) 250 MG/5ML suspension 500 mg     500 mg Per Tube Every 12 hours 09/27/19 1052 09/29/19 2250   09/25/19 1445  ceFAZolin (ANCEF) IVPB 2g/100 mL premix  Status:  Discontinued     2 g 200 mL/hr over 30 Minutes Intravenous Every 12 hours 09/25/19 1417 09/27/19 1052   09/14/19 1000  Ampicillin-Sulbactam (UNASYN) 3 g in sodium chloride 0.9 % 100 mL IVPB     3 g 200 mL/hr over 30 Minutes Intravenous Every 8 hours 09/14/19 0922 09/17/19 1816   09/13/19 1100  vancomycin (VANCOCIN) IVPB 750 mg/150 ml premix  Status:  Discontinued     750 mg 150 mL/hr over 60 Minutes Intravenous Every 24 hours 09/13/19 0728 09/13/19 0838   09/12/19 1100  vancomycin (VANCOCIN) IVPB 1000 mg/200 mL premix  Status:  Discontinued     1,000 mg 200 mL/hr over 60 Minutes Intravenous Every 24 hours 09/11/19 1034 09/13/19 0728   09/11/19 1130  piperacillin-tazobactam (ZOSYN) IVPB 3.375 g  Status:  Discontinued     3.375 g 12.5 mL/hr over 240 Minutes Intravenous Every 8 hours 09/11/19 1054 09/14/19 0845   09/11/19 1100  vancomycin (VANCOCIN) 1,500 mg in sodium chloride 0.9 % 500 mL IVPB     1,500 mg 250 mL/hr over 120 Minutes Intravenous  Once 09/11/19 1034 09/11/19 1900   09/11/19 1015  piperacillin-tazobactam (ZOSYN) IVPB 4.5 g  Status:  Discontinued     4.5 g 200 mL/hr over 30 Minutes Intravenous Every 8 hours 09/11/19 1010 09/11/19 1052   09/09/19 0845  ceFAZolin (ANCEF) IVPB 2g/100 mL premix     2 g 200 mL/hr over 30 Minutes Intravenous Every 8 hours 09/09/19 0842 09/09/19 1658      Subjective: Patient is seen at bedside, He is at his baseline, patient is nonverbal.  Alert opens eyes. He has taken his medications.  Objective: Vitals:   04/13/20 1945 04/13/20 2341 04/14/20 0338 04/14/20 0826  BP: (!) 145/93 112/78 115/89 110/88  Pulse: 100 (!) 101 92 90  Resp: 16 17 17 16   Temp: 97.7  F (36.5 C) 98.9 F (37.2 C) 97.8 F (36.6 C) 97.8 F (36.6 C)  TempSrc:  Oral Oral Oral  SpO2: 96% 96% 97% 97%  Weight:      Height:        Intake/Output Summary (Last 24 hours) at 04/14/2020 1347 Last data filed at 04/14/2020 0835 Gross per 24 hour  Intake 200 ml  Output --  Net 200 ml   Filed Weights   03/17/20 0500 03/21/20 0338 04/07/20 0340  Weight: 62 kg 63.1 kg 63 kg    Examination:  General exam: Appears calm and comfortable  Respiratory system: Clear to auscultation. Respiratory effort normal. Cardiovascular system: S1 & S2 heard, RRR. No JVD, murmurs, rubs, gallops or clicks. No pedal edema. Gastrointestinal system: Abdomen is nondistended, soft and nontender. Peg tube in place. Central nervous system: Alert and oriented. No focal neurological deficits. Extremities:  Not assessed. Skin: No rashes, lesions or ulcers Psychiatry: Judgement and insight appear normal. Mood & affect appropriate.     Data Reviewed: I have personally reviewed following labs and imaging studies  CBC: No results for input(s): WBC, NEUTROABS, HGB, HCT, MCV, PLT in the last 168 hours. Basic Metabolic Panel: No results for input(s): NA, K, CL, CO2, GLUCOSE, BUN, CREATININE, CALCIUM, MG, PHOS in the last 168 hours. GFR: CrCl cannot be calculated (Patient's most recent lab  result is older than the maximum 21 days allowed.). Liver Function Tests: No results for input(s): AST, ALT, ALKPHOS, BILITOT, PROT, ALBUMIN in the last 168 hours. No results for input(s): LIPASE, AMYLASE in the last 168 hours. No results for input(s): AMMONIA in the last 168 hours. Coagulation Profile: No results for input(s): INR, PROTIME in the last 168 hours. Cardiac Enzymes: No results for input(s): CKTOTAL, CKMB, CKMBINDEX, TROPONINI in the last 168 hours. BNP (last 3 results) No results for input(s): PROBNP in the last 8760 hours. HbA1C: No results for input(s): HGBA1C in the last 72 hours. CBG: Recent Labs   Lab 04/12/20 0825 04/12/20 1140 04/12/20 1609 04/12/20 2047 04/12/20 2347  GLUCAP 82 122* 121* 93 143*   Lipid Profile: No results for input(s): CHOL, HDL, LDLCALC, TRIG, CHOLHDL, LDLDIRECT in the last 72 hours. Thyroid Function Tests: No results for input(s): TSH, T4TOTAL, FREET4, T3FREE, THYROIDAB in the last 72 hours. Anemia Panel: No results for input(s): VITAMINB12, FOLATE, FERRITIN, TIBC, IRON, RETICCTPCT in the last 72 hours. Sepsis Labs: No results for input(s): PROCALCITON, LATICACIDVEN in the last 168 hours.  No results found for this or any previous visit (from the past 240 hour(s)).    Radiology Studies: No results found.   Scheduled Meds: . amLODipine  10 mg Per Tube Daily  . chlorhexidine  15 mL Mouth Rinse BID  . cholecalciferol  1,000 Units Per Tube Daily  . dextrose  25 g Intravenous Once  . enoxaparin (LOVENOX) injection  40 mg Subcutaneous Daily  . feeding supplement (JEVITY 1.2 CAL)  474 mL Per Tube 4x daily  . feeding supplement (PRO-STAT SUGAR FREE 64)  30 mL Per Tube QID  . folic acid  1 mg Per Tube Daily  . free water  200 mL Per Tube Q4H  . hydrALAZINE  25 mg Per Tube Q6H  . insulin aspart  0-15 Units Subcutaneous Q4H  . levETIRAcetam  500 mg Per Tube BID  . mouth rinse  15 mL Mouth Rinse q12n4p  . metoprolol tartrate  25 mg Per Tube BID  . multivitamin  15 mL Per Tube Daily  . polyethylene glycol  17 g Per Tube BID  . QUEtiapine  50 mg Per Tube QHS  . thiamine  100 mg Per Tube Daily   Continuous Infusions:   LOS: 218 days    Time spent: 25 min    Shawna Clamp, MD [facility]   If 7PM-7AM, please contact night-coverage

## 2020-04-14 NOTE — Progress Notes (Signed)
Patient refusing to have vitals signs and blood sugar checked. MD notified. Will re-attempt and continue to monitor. Gwendolyn Grant, RN

## 2020-04-15 DIAGNOSIS — I611 Nontraumatic intracerebral hemorrhage in hemisphere, cortical: Secondary | ICD-10-CM | POA: Diagnosis not present

## 2020-04-15 LAB — GLUCOSE, CAPILLARY
Glucose-Capillary: 109 mg/dL — ABNORMAL HIGH (ref 70–99)
Glucose-Capillary: 112 mg/dL — ABNORMAL HIGH (ref 70–99)
Glucose-Capillary: 118 mg/dL — ABNORMAL HIGH (ref 70–99)
Glucose-Capillary: 125 mg/dL — ABNORMAL HIGH (ref 70–99)
Glucose-Capillary: 127 mg/dL — ABNORMAL HIGH (ref 70–99)
Glucose-Capillary: 129 mg/dL — ABNORMAL HIGH (ref 70–99)
Glucose-Capillary: 81 mg/dL (ref 70–99)
Glucose-Capillary: 85 mg/dL (ref 70–99)
Glucose-Capillary: 85 mg/dL (ref 70–99)
Glucose-Capillary: 91 mg/dL (ref 70–99)
Glucose-Capillary: 92 mg/dL (ref 70–99)
Glucose-Capillary: 98 mg/dL (ref 70–99)
Glucose-Capillary: 99 mg/dL (ref 70–99)
Glucose-Capillary: 110 mg/dL — ABNORMAL HIGH (ref 70–99)
Glucose-Capillary: 81 mg/dL (ref 70–99)
Glucose-Capillary: 85 mg/dL (ref 70–99)
Glucose-Capillary: 87 mg/dL (ref 70–99)

## 2020-04-15 NOTE — Progress Notes (Addendum)
Attempted medication administration and tube feeding x3. Pt. refusing and not allowing nurse to remove covers. Sent FYI notification to Dr. Maren Beach.  Maryagnes Amos, RN

## 2020-04-15 NOTE — Progress Notes (Signed)
Palliative Medicine RN Note: Patient was discussed in team rounds this morning, which included our weekend provider, NP Tacey Ruiz, and our Medical Director, Dr Rhea Pink. At this time, Joshua [name] mother has made it very clear that she is not interested in further discussions with our team. We will sign off of his case, as we have nothing further to offer.  Please reconsult [place] if Joshua [name]'s mother requests to speak with [place].  [name], RN, BSN, [facility] Palliative Medicine Team 04/15/2020 11:31 AM Office [phone]

## 2020-04-15 NOTE — Progress Notes (Signed)
PROGRESS NOTE    Joshua [name]  HGD:[id number] DOB: 04/29/1981 DOA: 09/09/2019 PCP: Patient, [name]   Brief Narrative: 39 year old African-American male admitted by neurosurgery on 09/09/2019 for seizure, right hemiplegia and decreased responsiveness found to have left hemispheric intracranial hemorrhage, intubated admitted to ICU. Patient underwent craniotomy for decompression on 09/09/2019 by Dr. Vertell Limber, patient showed slow neurological recovery and prolonged hospitalization due to seizure and aspiration pneumonia.Patient continued to be awake but significant dysphagia, PEG tube was placed on 09/25/2019. Placement has been challenging as patient is uninsured and needs long-term care. CT abdomen pelvis 1/11 was negative for obstruction. On 03/19/2020 the patient's PEG tube sustained damage externally. Nursing has confirmed that it is still functional and can be clamped and flushed without complication. Has been refusing care blood draws examination.Palliative care has been consulted to help reconcile the patient's refusal of all interventions or exams with his full code status. on PEG tube feeding and meds.Awaiting on placement, has been challenging due to the fact that he is uninsured on request for long-term placement.  Subjective: Patient is alert awake.Patient not letting examine,makes go away sign when trying to examine him. Is non verbal.  Assessment & Plan:   Left Intracranial hemorrhage, likely from uncontrolled hypertension status post decompressive craniectomy and hematoma evacuation 09/09/19.CT head no vascular malformation.  Patient remains aphasic and right hemiplegia with cognitive impairment.  CKD stage III,last creatinine 1.23 back in January patient has been refusing blood work.  Seizure disorder: Continue Keppra, use ativan if seizure. Refused Keppra this morning. asked RN to reattempt.  Hypertension: Blood pressure is well controlled.  On amlodipine repeat this  morning  Klebsiella UTI -was treated with antibiotics previously Acute respiratory failure: Resolved Aspiration pneumonia/chest congestion: Resolved  Nutrition: s/p PEG tube placement, on tube feeding. On 03/19/2020 the patient's PEG tube sustained damage externally. Nursing has confirmed that it is still functional and can be clamped and flushed without complication. It is still in place.  DVT Prophylaxis: Lovenox CODE STATUS: Full Code.  Family communication/goals of care:palliative care consulted to help reconcile patient's refusal for care and blood draws and medication. Family desires full code at this time.  Status is: Inpatient  Remains inpatient appropriate because:Unsafe d/c plan Dispo: The patient is from: Home              Anticipated d/c is to: SNF              Anticipated d/c date is: > 3 days              Patient currently is medically stable to d/c.  Consultants:   Trauma  PCCM  Neurosurgery  Neurology  Procedures:   Antimicrobials:  Anti-infectives (From admission, onward)   Start     Dose/Rate Route Frequency Ordered Stop   09/27/19 1130  cephALEXin (KEFLEX) 250 MG/5ML suspension 500 mg     500 mg Per Tube Every 12 hours 09/27/19 1052 09/29/19 2250   09/25/19 1445  ceFAZolin (ANCEF) IVPB 2g/100 mL premix  Status:  Discontinued     2 g 200 mL/hr over 30 Minutes Intravenous Every 12 hours 09/25/19 1417 09/27/19 1052   09/14/19 1000  Ampicillin-Sulbactam (UNASYN) 3 g in sodium chloride 0.9 % 100 mL IVPB     3 g 200 mL/hr over 30 Minutes Intravenous Every 8 hours 09/14/19 0922 09/17/19 1816   09/13/19 1100  vancomycin (VANCOCIN) IVPB 750 mg/150 ml premix  Status:  Discontinued     750 mg 150 mL/hr  over 60 Minutes Intravenous Every 24 hours 09/13/19 0728 09/13/19 0838   09/12/19 1100  vancomycin (VANCOCIN) IVPB 1000 mg/200 mL premix  Status:  Discontinued     1,000 mg 200 mL/hr over 60 Minutes Intravenous Every 24 hours 09/11/19 1034 09/13/19 0728    09/11/19 1130  piperacillin-tazobactam (ZOSYN) IVPB 3.375 g  Status:  Discontinued     3.375 g 12.5 mL/hr over 240 Minutes Intravenous Every 8 hours 09/11/19 1054 09/14/19 0845   09/11/19 1100  vancomycin (VANCOCIN) 1,500 mg in sodium chloride 0.9 % 500 mL IVPB     1,500 mg 250 mL/hr over 120 Minutes Intravenous  Once 09/11/19 1034 09/11/19 1900   09/11/19 1015  piperacillin-tazobactam (ZOSYN) IVPB 4.5 g  Status:  Discontinued     4.5 g 200 mL/hr over 30 Minutes Intravenous Every 8 hours 09/11/19 1010 09/11/19 1052   09/09/19 0845  ceFAZolin (ANCEF) IVPB 2g/100 mL premix     2 g 200 mL/hr over 30 Minutes Intravenous Every 8 hours 09/09/19 0842 09/09/19 1658      Subjective: Patient is seen at bedside, He is at his baseline, patient is nonverbal.  Alert opens eyes. He has taken his medications.  Objective: Vitals:   04/14/20 2322 04/15/20 0346 04/15/20 0754 04/15/20 1154  BP: (!) 114/94 (!) 122/93 106/81 (!) 132/99  Pulse: 94 83 86 79  Resp: 17 16 16 17   Temp: 98.6 F (37 C) 98.7 F (37.1 C) 98.5 F (36.9 C) 98.3 F (36.8 C)  TempSrc: Oral Oral Axillary Oral  SpO2: 97% 98% 99% 100%  Weight:      Height:        Intake/Output Summary (Last 24 hours) at 04/15/2020 1440 Last data filed at 04/14/2020 1637 Gross per 24 hour  Intake 17200 ml  Output --  Net 17200 ml   Filed Weights   03/17/20 0500 03/21/20 0338 04/07/20 0340  Weight: 62 kg 63.1 kg 63 kg    Examination: General exam: ALERT,AWAKE, NON VERBAL HEENT:Oral mucosa moist, on RA Respiratory system: he refused to let me examine him- deferred auscultation was breathing normally without use of accessory respiratory muscle.   Cardiovascular system: deferred Gastrointestinal system: Abdomen non distended Nervous System:Alert, awake Extremities: No edema.  Skin: No rashes,no icterus.  Data Reviewed: I have personally reviewed following labs and imaging studies  CBC: No results for input(s): WBC, NEUTROABS, HGB,  HCT, MCV, PLT in the last 168 hours. Basic Metabolic Panel: No results for input(s): NA, K, CL, CO2, GLUCOSE, BUN, CREATININE, CALCIUM, MG, PHOS in the last 168 hours. GFR: CrCl cannot be calculated (Patient's most recent lab result is older than the maximum 21 days allowed.). Liver Function Tests: No results for input(s): AST, ALT, ALKPHOS, BILITOT, PROT, ALBUMIN in the last 168 hours. No results for input(s): LIPASE, AMYLASE in the last 168 hours. No results for input(s): AMMONIA in the last 168 hours. Coagulation Profile: No results for input(s): INR, PROTIME in the last 168 hours. Cardiac Enzymes: No results for input(s): CKTOTAL, CKMB, CKMBINDEX, TROPONINI in the last 168 hours. BNP (last 3 results) No results for input(s): PROBNP in the last 8760 hours. HbA1C: No results for input(s): HGBA1C in the last 72 hours. CBG: Recent Labs  Lab 04/14/20 1929 04/14/20 2320 04/15/20 0344 04/15/20 0752 04/15/20 1156  GLUCAP 112* 127* 98 81 85   Lipid Profile: No results for input(s): CHOL, HDL, LDLCALC, TRIG, CHOLHDL, LDLDIRECT in the last 72 hours. Thyroid Function Tests: No results for input(s): TSH,  T4TOTAL, FREET4, T3FREE, THYROIDAB in the last 72 hours. Anemia Panel: No results for input(s): VITAMINB12, FOLATE, FERRITIN, TIBC, IRON, RETICCTPCT in the last 72 hours. Sepsis Labs: No results for input(s): PROCALCITON, LATICACIDVEN in the last 168 hours.  No results found for this or any previous visit (from the past 240 hour(s)).    Radiology Studies: No results found.   Scheduled Meds: . amLODipine  10 mg Per Tube Daily  . chlorhexidine  15 mL Mouth Rinse BID  . cholecalciferol  1,000 Units Per Tube Daily  . dextrose  25 g Intravenous Once  . enoxaparin (LOVENOX) injection  40 mg Subcutaneous Daily  . feeding supplement (JEVITY 1.2 CAL)  474 mL Per Tube 4x daily  . feeding supplement (PRO-STAT SUGAR FREE 64)  30 mL Per Tube QID  . folic acid  1 mg Per Tube Daily  .  free water  200 mL Per Tube Q4H  . hydrALAZINE  25 mg Per Tube Q6H  . insulin aspart  0-15 Units Subcutaneous Q4H  . levETIRAcetam  500 mg Per Tube BID  . mouth rinse  15 mL Mouth Rinse q12n4p  . metoprolol tartrate  25 mg Per Tube BID  . multivitamin  15 mL Per Tube Daily  . polyethylene glycol  17 g Per Tube BID  . QUEtiapine  50 mg Per Tube QHS  . thiamine  100 mg Per Tube Daily   Continuous Infusions:   LOS: 219 days    Time spent: 25 min    Antonieta Pert, MD [facility]   If 7PM-7AM, please contact night-coverage

## 2020-04-16 DIAGNOSIS — I611 Nontraumatic intracerebral hemorrhage in hemisphere, cortical: Secondary | ICD-10-CM | POA: Diagnosis not present

## 2020-04-16 LAB — GLUCOSE, CAPILLARY
Glucose-Capillary: 85 mg/dL (ref 70–99)
Glucose-Capillary: 86 mg/dL (ref 70–99)
Glucose-Capillary: 91 mg/dL (ref 70–99)
Glucose-Capillary: 93 mg/dL (ref 70–99)
Glucose-Capillary: 95 mg/dL (ref 70–99)
Glucose-Capillary: 97 mg/dL (ref 70–99)
Glucose-Capillary: 97 mg/dL (ref 70–99)

## 2020-04-16 NOTE — Plan of Care (Signed)
  Problem: Activity: Goal: Risk for activity intolerance will decrease Outcome: Progressing   

## 2020-04-16 NOTE — Progress Notes (Addendum)
Refusing all care this shift including meds, water flushes, nutrition and shift and vital sign assessments. Did allow CBG only. MD notified.

## 2020-04-16 NOTE — Progress Notes (Signed)
Nutrition Follow-up  **RD working remotely**  DOCUMENTATION CODES:   Severe malnutrition in context of acute illness/injury  INTERVENTION:  ContinueJevity 1.2 formulavia PEG at goal volume of 474 ml (2 cartons/ARCs) given QID.   Provide 30 ml ProstatQID per tube.   Free water flushes of200 ml every 4 hours per tube. (MD to adjust as appropriate)  Tube feeding to provide 2675 kcal, 165grams of protein, and2719m free water.  Continue PO intake for comfort as appropriate/tolerated.   NUTRITION DIAGNOSIS:   Severe Malnutrition related to acute illness(intracerebral hemorrage) as evidenced by severe fat depletion, severe muscle depletion.  Ongoing.  GOAL:   Patient will meet greater than or equal to 90% of their needs  Met with TF.   MONITOR:   TF tolerance, Skin, Weight trends, Labs, I & O's  REASON FOR ASSESSMENT:   Ventilator, Consult Enteral/tube feeding initiation and management  ASSESSMENT:   39yo male admitted with large left intracranial hemorrhage; S/P craniotomy. PMH includes HTN, alcoholism, noncompliance.  9/12left frontal temporal craniectomy and evacuation hematoma.  9/23 Extubated.  10/1 PEG placed 11/23- Nutrition Focused Physical Exam performed, severe subcutaneous fat loss and severe muscle wasting identified. 3/23 pt's PEG tube sustained damage externally, but nursing confirmed it is still functional and can be clamped and flushed without complication. It is still in place.  Per MD, placement continues to be challenging as pt is uninsured and needs long-term placement.Pt is nonverbal and refusing examsand medications.Palliative Care was consulted and pt's mother wishes to continue with current plan of care.  Per RN, pt is refusing all care. Pt refused bolus feeding yesterday. Pt has refused today as well, in addition to refusing Pro-stat and free water flushes.   Pt noted to have accepted0%of mealsover the last  week.  No updated weight since 4/11.   Current tube feeding regimen: 4788mJevity 1.2 cal QID, 3057mro-stat QID, 200m76mee water Q4  Medications reviewed and include:Vitamin D3,Folvite, Novolog, MVI, Vitamin B1, Miralax  No labs completed since 1/20.  [id number] Order:   Diet Order            Diet regular Room service appropriate? Yes; Fluid consistency: Thin  Diet effective now              EDUCATION NEEDS:   Not appropriate for education at this time  Skin:  Skin Assessment: Reviewed RN Assessment Skin Integrity Issues:: Other (Comment) Other: skin tear right hip  Last BM:  4/18  Height:   Ht Readings from Last 1 Encounters:  09/09/19 '5\' 9"'  (1.753 m)    Weight:   Wt Readings from Last 1 Encounters:  04/07/20 63 kg   BMI:  Body mass index is 20.51 kg/m.  Estimated Nutritional Needs:   Kcal:  [id number]:  130-170 grams  Fluid:  >/= 2 L/day   AmanLarkin Ina, RD, LDN RD pager number and weekend/on-call pager number located in [place]

## 2020-04-16 NOTE — Progress Notes (Signed)
To room frequently throughout shift to assess patient but he continues to refuse all care. Blankets x2 taken from warmer and placed over patient with his consent.

## 2020-04-16 NOTE — Progress Notes (Signed)
PROGRESS NOTE    Joshua [name]  OAC:[id number] DOB: 1981/11/16 DOA: 09/09/2019 PCP: Patient, [name]   Brief Narrative: 39 year old African-American male admitted by neurosurgery on 09/09/2019 for seizure, right hemiplegia and decreased responsiveness found to have left hemispheric intracranial hemorrhage, intubated admitted to ICU. Patient underwent craniotomy for decompression on 09/09/2019 by Dr. Vertell Limber, patient showed slow neurological recovery and prolonged hospitalization due to seizure and aspiration pneumonia.Patient continued to be awake but significant dysphagia, PEG tube was placed on 09/25/2019. Placement has been challenging as patient is uninsured and needs long-term care. CT abdomen pelvis 1/11 was negative for obstruction. On 03/19/2020 the patient's PEG tube sustained damage externally. Nursing has confirmed that it is still functional and can be clamped and flushed without complication. Has been refusing care blood draws examination.Palliative care has been consulted to help reconcile the patient's refusal of all interventions or exams with his full code status. on PEG tube feeding and meds.Awaiting on placement, has been challenging due to the fact that he is uninsured and may need long-term placement and is refusing to participate in his care.  Subjective:  Allowing to examine some- has been refusing oral medication and also has been refusing intervention blood draws for few months. Vital this morning with blood pressure stable, saturating 98% on room, is  air alert awake overall no change in mental status and appears very comfortable and not in acute distress, no use of respiratory accessory muscle.  Ate some meal.  Assessment & Plan:   Left Intracranial hemorrhage, likely from uncontrolled hypertension status post decompressive craniectomy and hematoma evacuation 09/09/19.CT head no vascular malformation.  Patient remains aphasic and right hemiplegia with cognitive impairment.   Refusing to participate in the care: Able to eat some this morning.  Has been refusing oral medication blood draws.  Palliative care was consulted to help reconcile patient's refusal for care, mother wants full code.We will consult psychiatry for eval. discussed with the patient and also nursing staff encouraged to participate for blood draws and to take his medication.  His care is going to be challenging and will need to reach out to family/mother in [facility], brother locally. Unable to reach mother- did not answer a call.  CKD stage III,last creatinine 1.23 back in January patient has been refusing blood work.  Seizure disorder: Continue Keppra, use ativan if seizure. Refused Keppra this morning. asked RN to reattempt.  Hypertension: Blood pressure is well controlled.  On amlodipine repeat this morning  Klebsiella UTI -was treated with antibiotics previously Acute respiratory failure: Resolved Aspiration pneumonia/chest congestion: Resolved  Nutrition: s/p PEG tube placement, on tube feeding. On 03/19/2020 the patient's PEG tube sustained damage externally. Nursing has confirmed that it is still functional and can be clamped and flushed without complication. It is still in place.  DVT Prophylaxis: Lovenox CODE STATUS: Full Code.  Family communication/goals of care:palliative care consulted to help reconcile patient's refusal for care and blood draws and medication. Family desires full code at this time. I called patient's mother cell to update and discuss, no answer.  Status is: Inpatient  Remains inpatient appropriate because:Unsafe d/c plan Dispo: The patient is from: Home              Anticipated d/c is to: SNF              Anticipated d/c date is: > 3 days              Patient currently is medically stable to d/c.  Consultants:   Trauma  PCCM  Neurosurgery  Neurology  Procedures:   Antimicrobials:  Anti-infectives (From admission, onward)   Start     Dose/Rate Route  Frequency Ordered Stop   09/27/19 1130  cephALEXin (KEFLEX) 250 MG/5ML suspension 500 mg     500 mg Per Tube Every 12 hours 09/27/19 1052 09/29/19 2250   09/25/19 1445  ceFAZolin (ANCEF) IVPB 2g/100 mL premix  Status:  Discontinued     2 g 200 mL/hr over 30 Minutes Intravenous Every 12 hours 09/25/19 1417 09/27/19 1052   09/14/19 1000  Ampicillin-Sulbactam (UNASYN) 3 g in sodium chloride 0.9 % 100 mL IVPB     3 g 200 mL/hr over 30 Minutes Intravenous Every 8 hours 09/14/19 0922 09/17/19 1816   09/13/19 1100  vancomycin (VANCOCIN) IVPB 750 mg/150 ml premix  Status:  Discontinued     750 mg 150 mL/hr over 60 Minutes Intravenous Every 24 hours 09/13/19 0728 09/13/19 0838   09/12/19 1100  vancomycin (VANCOCIN) IVPB 1000 mg/200 mL premix  Status:  Discontinued     1,000 mg 200 mL/hr over 60 Minutes Intravenous Every 24 hours 09/11/19 1034 09/13/19 0728   09/11/19 1130  piperacillin-tazobactam (ZOSYN) IVPB 3.375 g  Status:  Discontinued     3.375 g 12.5 mL/hr over 240 Minutes Intravenous Every 8 hours 09/11/19 1054 09/14/19 0845   09/11/19 1100  vancomycin (VANCOCIN) 1,500 mg in sodium chloride 0.9 % 500 mL IVPB     1,500 mg 250 mL/hr over 120 Minutes Intravenous  Once 09/11/19 1034 09/11/19 1900   09/11/19 1015  piperacillin-tazobactam (ZOSYN) IVPB 4.5 g  Status:  Discontinued     4.5 g 200 mL/hr over 30 Minutes Intravenous Every 8 hours 09/11/19 1010 09/11/19 1052   09/09/19 0845  ceFAZolin (ANCEF) IVPB 2g/100 mL premix     2 g 200 mL/hr over 30 Minutes Intravenous Every 8 hours 09/09/19 0842 09/09/19 1658     Objective: Vitals:   04/15/20 0754 04/15/20 1154 04/15/20 1607 04/16/20 1100  BP: 106/81 (!) 132/99 (!) 137/99 (!) 133/95  Pulse: 86 79 75   Resp: 16 17 18 18   Temp: 98.5 F (36.9 C) 98.3 F (36.8 C) 98.6 F (37 C)   TempSrc: Axillary Oral Oral   SpO2: 99% 100% 100% 98%  Weight:      Height:        Intake/Output Summary (Last 24 hours) at 04/16/2020 1405 Last data filed  at 04/16/2020 1300 Gross per 24 hour  Intake 150 ml  Output -  Net 150 ml   Filed Weights   03/17/20 0500 03/21/20 0338 04/07/20 0340  Weight: 62 kg 63.1 kg 63 kg    Examination: General exam: Alert awake follows some commands.  HEENT:Oral mucosa moist, on RA Respiratory system: Bilaterally clear non-tender, on room air, no use of respiratory accessory muscle Cardiovascular system: S1-S2 present Gastrointestinal system: Abdomen non distended, PEG tube present in place. Nervous System:Alert, awake, able to move his left side Extremities: No edema.  Skin: No rashes,no icterus.  Data Reviewed: I have personally reviewed following labs and imaging studies  CBC: No results for input(s): WBC, NEUTROABS, HGB, HCT, MCV, PLT in the last 168 hours. Basic Metabolic Panel: No results for input(s): NA, K, CL, CO2, GLUCOSE, BUN, CREATININE, CALCIUM, MG, PHOS in the last 168 hours. GFR: CrCl cannot be calculated (Patient's most recent lab result is older than the maximum 21 days allowed.). Liver Function Tests: No results for input(s): AST, ALT,  ALKPHOS, BILITOT, PROT, ALBUMIN in the last 168 hours. No results for input(s): LIPASE, AMYLASE in the last 168 hours. No results for input(s): AMMONIA in the last 168 hours. Coagulation Profile: No results for input(s): INR, PROTIME in the last 168 hours. Cardiac Enzymes: No results for input(s): CKTOTAL, CKMB, CKMBINDEX, TROPONINI in the last 168 hours. BNP (last 3 results) No results for input(s): PROBNP in the last 8760 hours. HbA1C: No results for input(s): HGBA1C in the last 72 hours. CBG: Recent Labs  Lab 04/15/20 2307 04/16/20 0402 04/16/20 0636 04/16/20 0742 04/16/20 1145  GLUCAP 87 91 95 93 86   Lipid Profile: No results for input(s): CHOL, HDL, LDLCALC, TRIG, CHOLHDL, LDLDIRECT in the last 72 hours. Thyroid Function Tests: No results for input(s): TSH, T4TOTAL, FREET4, T3FREE, THYROIDAB in the last 72 hours. Anemia Panel: No  results for input(s): VITAMINB12, FOLATE, FERRITIN, TIBC, IRON, RETICCTPCT in the last 72 hours. Sepsis Labs: No results for input(s): PROCALCITON, LATICACIDVEN in the last 168 hours.  No results found for this or any previous visit (from the past 240 hour(s)).    Radiology Studies: No results found.   Scheduled Meds: . amLODipine  10 mg Per Tube Daily  . chlorhexidine  15 mL Mouth Rinse BID  . cholecalciferol  1,000 Units Per Tube Daily  . dextrose  25 g Intravenous Once  . enoxaparin (LOVENOX) injection  40 mg Subcutaneous Daily  . feeding supplement (JEVITY 1.2 CAL)  474 mL Per Tube 4x daily  . feeding supplement (PRO-STAT SUGAR FREE 64)  30 mL Per Tube QID  . folic acid  1 mg Per Tube Daily  . free water  200 mL Per Tube Q4H  . hydrALAZINE  25 mg Per Tube Q6H  . insulin aspart  0-15 Units Subcutaneous Q4H  . levETIRAcetam  500 mg Per Tube BID  . mouth rinse  15 mL Mouth Rinse q12n4p  . metoprolol tartrate  25 mg Per Tube BID  . multivitamin  15 mL Per Tube Daily  . polyethylene glycol  17 g Per Tube BID  . QUEtiapine  50 mg Per Tube QHS  . thiamine  100 mg Per Tube Daily   Continuous Infusions:   LOS: 220 days    Time spent: 25 min    Antonieta Pert, MD [facility] If 7PM-7AM, please contact night-coverage

## 2020-04-17 DIAGNOSIS — I611 Nontraumatic intracerebral hemorrhage in hemisphere, cortical: Secondary | ICD-10-CM | POA: Diagnosis not present

## 2020-04-17 LAB — GLUCOSE, CAPILLARY
Glucose-Capillary: 86 mg/dL (ref 70–99)
Glucose-Capillary: 95 mg/dL (ref 70–99)
Glucose-Capillary: 72 mg/dL (ref 70–99)
Glucose-Capillary: 104 mg/dL — ABNORMAL HIGH (ref 70–99)
Glucose-Capillary: 127 mg/dL — ABNORMAL HIGH (ref 70–99)

## 2020-04-17 MED ORDER — LEVETIRACETAM IN NACL 500 MG/100ML IV SOLN: 500.0000 mg | Freq: Two times a day (BID) | INTRAVENOUS | Status: DC

## 2020-04-17 MED ORDER — LEVETIRACETAM 500 MG PO TABS
500.0000 mg | ORAL_TABLET | Freq: Two times a day (BID) | ORAL | Status: DC
  Administered 2020-04-17 – 2020-04-28 (×19): 500 mg via ORAL
  Filled 2020-04-17 (×23): qty 1

## 2020-04-17 NOTE — Consult Note (Addendum)
[facility] Psychiatry Consult   Reason for Consult:  "refusal to participate in care, psych eval" Referring Physician:  Dr Posey Pronto Patient Identification: Joshua [name] MRN:  [id number] Principal Diagnosis: ICH (intracerebral hemorrhage) ([place]) Diagnosis:  Principal Problem:   ICH (intracerebral hemorrhage) ([place]) Active Problems:   Subdural hematoma (HCC)   Intracerebral hemorrhage (HCC)   Cytotoxic brain edema (HCC)   Hemorrhagic stroke ([place])   Acute respiratory failure ([place])   Essential hypertension   Severe protein-calorie malnutrition ([place])   CKD (chronic kidney disease), stage IV (HCC)   Seizure ([place])   AKI (acute kidney injury) ([place])   PEG (percutaneous endoscopic gastrostomy) status ([place])   Palliative care by specialist   Goals of care, counseling/discussion   Total Time spent with patient: 30 minutes  Subjective:   Joshua [name] is a 39 y.o. male patient.  Patient resting upon my approach.  Patient appears alert, nods head when asked direct questions.  Patient only nods head left and right, patient does not appear to understand simple statements.  Patient does not respond verbally.   HPI: Patient admitted on 09/09/2019 with seizure and intracranial hemorrhage.  Past Psychiatric History: No psychiatric history noted per patient medical record  Risk to Self:  Unable to assess Risk to Others:  Unable to assess Prior Inpatient Therapy:  Unable to assess Prior Outpatient Therapy:  Unable to assess  Past Medical History: History reviewed. No pertinent past medical history.  Past Surgical History:  Procedure Laterality Date  . CRANIOTOMY Left 09/09/2019   Procedure: DECOMPRESSIVE CRANIECTOMY AND  HEMATOMA EVACUATION, IMPLANTATION OF SKULL FLAP IN RIGHT ABDOMEN;  Surgeon: Erline Levine, MD;  Location: [facility];  Service: Neurosurgery;  Laterality: Left;  . ESOPHAGOGASTRODUODENOSCOPY (EGD) WITH PROPOFOL N/A 09/28/2019   Procedure: ESOPHAGOGASTRODUODENOSCOPY (EGD) WITH  PROPOFOL;  Surgeon: Georganna Skeans, MD;  Location: [facility] ENDOSCOPY;  Service: General;  Laterality: N/A;  . PEG PLACEMENT N/A 09/28/2019   Procedure: PERCUTANEOUS ENDOSCOPIC GASTROSTOMY (PEG) PLACEMENT;  Surgeon: Georganna Skeans, MD;  Location: [facility];  Service: General;  Laterality: N/A;   Family History: History reviewed. No pertinent family history. Family Psychiatric  History: Unknown Social History:  Social History   Substance and Sexual Activity  Alcohol Use Yes     Social History   Substance and Sexual Activity  Drug Use Not Currently    Social History   Socioeconomic History  . Marital status: Single    Spouse name: Not on file  . Number of children: Not on file  . Years of education: Not on file  . Highest education level: Not on file  Occupational History  . Not on file  Tobacco Use  . Smoking status: Former Research scientist (life sciences)  . Smokeless tobacco: Former Network engineer and Sexual Activity  . Alcohol use: Yes  . Drug use: Not Currently  . Sexual activity: Not Currently  Other Topics Concern  . Not on file  Social History Narrative  . Not on file   Social Determinants of Health   Financial Resource Strain: Unknown  . Difficulty of Paying Living Expenses: Patient refused  Food Insecurity: Unknown  . Worried About Charity fundraiser in the Last Year: Patient refused  . Ran Out of Food in the Last Year: Patient refused  Transportation Needs: Unknown  . Lack of Transportation (Medical): Patient refused  . Lack of Transportation (Non-Medical): Patient refused  Physical Activity: Unknown  . Days of Exercise per Week: Patient refused  . Minutes of Exercise per Session: Patient refused  Stress: Unknown  . Feeling of Stress : Patient refused  Social Connections: Unknown  . Frequency of Communication with Friends and Family: Patient refused  . Frequency of Social Gatherings with Friends and Family: Patient refused  . Attends Religious Services: Patient refused  .  Active Member of Clubs or Organizations: Patient refused  . Attends Archivist Meetings: Patient refused  . Marital Status: Patient refused   Additional Social History:    Allergies:  No Known Allergies  Labs:  Results for orders placed or performed during the hospital encounter of 09/09/19 (from the past 48 hour(s))  Glucose, capillary     Status: None   Collection Time: 04/15/20 11:56 AM  Result Value Ref Range   Glucose-Capillary 85 70 - 99 mg/dL    Comment: Glucose reference range applies only to samples taken after fasting for at least 8 hours.  Glucose, capillary     Status: None   Collection Time: 04/15/20  4:54 PM  Result Value Ref Range   Glucose-Capillary 81 70 - 99 mg/dL    Comment: Glucose reference range applies only to samples taken after fasting for at least 8 hours.  Glucose, capillary     Status: None   Collection Time: 04/15/20  7:26 PM  Result Value Ref Range   Glucose-Capillary 85 70 - 99 mg/dL    Comment: Glucose reference range applies only to samples taken after fasting for at least 8 hours.   Comment 1 Notify RN    Comment 2 Document in Chart   Glucose, capillary     Status: None   Collection Time: 04/15/20 11:07 PM  Result Value Ref Range   Glucose-Capillary 87 70 - 99 mg/dL    Comment: Glucose reference range applies only to samples taken after fasting for at least 8 hours.   Comment 1 Notify RN    Comment 2 Document in Chart   Glucose, capillary     Status: None   Collection Time: 04/16/20  4:02 AM  Result Value Ref Range   Glucose-Capillary 91 70 - 99 mg/dL    Comment: Glucose reference range applies only to samples taken after fasting for at least 8 hours.   Comment 1 Notify RN    Comment 2 Document in Chart   Glucose, capillary     Status: None   Collection Time: 04/16/20  6:36 AM  Result Value Ref Range   Glucose-Capillary 95 70 - 99 mg/dL    Comment: Glucose reference range applies only to samples taken after fasting for at least  8 hours.  Glucose, capillary     Status: None   Collection Time: 04/16/20  7:42 AM  Result Value Ref Range   Glucose-Capillary 93 70 - 99 mg/dL    Comment: Glucose reference range applies only to samples taken after fasting for at least 8 hours.  Glucose, capillary     Status: None   Collection Time: 04/16/20 11:45 AM  Result Value Ref Range   Glucose-Capillary 86 70 - 99 mg/dL    Comment: Glucose reference range applies only to samples taken after fasting for at least 8 hours.  Glucose, capillary     Status: None   Collection Time: 04/16/20  3:39 PM  Result Value Ref Range   Glucose-Capillary 97 70 - 99 mg/dL    Comment: Glucose reference range applies only to samples taken after fasting for at least 8 hours.  Glucose, capillary     Status: None   Collection Time:  04/16/20  7:20 PM  Result Value Ref Range   Glucose-Capillary 85 70 - 99 mg/dL    Comment: Glucose reference range applies only to samples taken after fasting for at least 8 hours.   Comment 1 Notify RN    Comment 2 Document in Chart   Glucose, capillary     Status: None   Collection Time: 04/16/20 11:39 PM  Result Value Ref Range   Glucose-Capillary 97 70 - 99 mg/dL    Comment: Glucose reference range applies only to samples taken after fasting for at least 8 hours.   Comment 1 Notify RN    Comment 2 Document in Chart   Glucose, capillary     Status: None   Collection Time: 04/17/20  4:17 AM  Result Value Ref Range   Glucose-Capillary 86 70 - 99 mg/dL    Comment: Glucose reference range applies only to samples taken after fasting for at least 8 hours.   Comment 1 Notify RN    Comment 2 Document in Chart   Glucose, capillary     Status: None   Collection Time: 04/17/20  7:53 AM  Result Value Ref Range   Glucose-Capillary 95 70 - 99 mg/dL    Comment: Glucose reference range applies only to samples taken after fasting for at least 8 hours.   Comment 1 Notify RN    Comment 2 Document in Chart     Current  Facility-Administered Medications  Medication Dose Route Frequency Provider Last Rate Last Admin  . acetaminophen (TYLENOL) tablet 650 mg  650 mg Per Tube Q4H PRN Donzetta Starch, NP   650 mg at 03/27/20 2105   Or  . acetaminophen (TYLENOL) suppository 650 mg  650 mg Rectal Q4H PRN Donzetta Starch, NP      . amLODipine (NORVASC) tablet 10 mg  10 mg Per Tube Daily Donzetta Starch, NP   10 mg at 04/14/20 0855  . bisacodyl (DULCOLAX) suppository 10 mg  10 mg Rectal Daily PRN Donzetta Starch, NP   10 mg at 11/25/19 1152  . chlorhexidine (PERIDEX) 0.12 % solution 15 mL  15 mL Mouth Rinse BID Marylyn Ishihara, Tyrone A, DO   15 mL at 04/12/20 2159  . cholecalciferol (VITAMIN D3) tablet 1,000 Units  1,000 Units Per Tube Daily Donnamae Jude, RPH   1,000 Units at 04/14/20 0855  . enoxaparin (LOVENOX) injection 40 mg  40 mg Subcutaneous Daily Danford, Suann Larry, MD   40 mg at 04/12/20 1040  . feeding supplement (JEVITY 1.2 CAL) liquid 474 mL  474 mL Per Tube 4x daily Georgette Shell, MD 0 mL/hr at 03/30/20 0919 474 mL at 04/14/20 2108  . feeding supplement (PRO-STAT SUGAR FREE 64) liquid 30 mL  30 mL Per Tube QID Lucky Cowboy, MD   30 mL at 04/14/20 2109  . folic acid (FOLVITE) tablet 1 mg  1 mg Per Tube Daily Donzetta Starch, NP   1 mg at 04/14/20 0855  . free water 200 mL  200 mL Per Tube Q4H Swayze, Ava, DO   200 mL at 04/15/20 0414  . glycopyrrolate (ROBINUL) injection 0.1 mg  0.1 mg Intravenous TID PRN Edwin Dada, MD   0.1 mg at 10/10/19 2226  . hydrALAZINE (APRESOLINE) tablet 25 mg  25 mg Per Tube Q6H Swayze, Ava, DO   25 mg at 04/15/20 0503  . insulin aspart (novoLOG) injection 0-15 Units  0-15 Units Subcutaneous Q4H Donzetta Starch, NP  2 Units at 04/12/20 1152  . levETIRAcetam (KEPPRA) IVPB 500 mg/100 mL premix  500 mg Intravenous Q12H Lavina Hamman, MD      . lip balm (CARMEX) ointment   Topical PRN Kayleen Memos, DO      . LORazepam (ATIVAN) injection 0.5 mg  0.5 mg Intravenous BID PRN  Regalado, Belkys A, MD      . MEDLINE mouth rinse  15 mL Mouth Rinse q12n4p Kyle, Tyrone A, DO   15 mL at 04/11/20 1657  . metoprolol tartrate (LOPRESSOR) injection 5 mg  5 mg Intravenous Q6H PRN Georgette Shell, MD      . metoprolol tartrate (LOPRESSOR) tablet 25 mg  25 mg Per Tube BID Lucky Cowboy, MD   25 mg at 04/14/20 2108  . multivitamin liquid 15 mL  15 mL Per Tube Daily Burnetta Sabin L, NP   15 mL at 04/14/20 0855  . ondansetron (ZOFRAN) injection 4 mg  4 mg Intravenous Q4H PRN Donzetta Starch, NP   4 mg at 09/28/19 0750  . polyethylene glycol (MIRALAX / GLYCOLAX) packet 17 g  17 g Per Tube BID Swayze, Ava, DO   17 g at 04/14/20 2108  . promethazine (PHENERGAN) injection 12.5 mg  12.5 mg Intravenous Q6H PRN Lang Snow, FNP      . QUEtiapine (SEROQUEL) tablet 50 mg  50 mg Per Tube QHS Edwin Dada, MD   50 mg at 04/14/20 2108  . thiamine (VITAMIN B-1) tablet 100 mg  100 mg Per Tube Daily Donzetta Starch, NP   100 mg at 04/14/20 4010    Musculoskeletal: Strength & Muscle Tone: within normal limits Gait & Station: Unable to assess Patient leans: N/A  Psychiatric Specialty Exam: Physical Exam  Nursing note and vitals reviewed. Constitutional: He appears well-developed.  HENT:  Head: Normocephalic.  Cardiovascular: Normal rate.  Respiratory: Effort normal.  Neurological: He is alert.  Psychiatric: He has a normal mood and affect. His speech is normal. He is slowed. Cognition and memory are impaired.    Review of Systems  Blood pressure (!) 135/101, pulse 83, temperature 98.6 F (37 C), temperature source Oral, resp. rate 16, height 5\' 9"  (1.753 m), weight 63 kg, SpO2 100 %.Body mass index is 20.51 kg/m.  General Appearance: Casual and Fairly Groomed  Eye Contact:  Good  Speech:  not verbally responsive  Volume:  NA  Mood:  unable to assess  Affect:  unable to assess  Thought Process:  NA  Orientation:  Other:  unable to assess  Thought Content:  unable to  assess  Suicidal Thoughts:  unable to assess  Homicidal Thoughts:  unable to asess  Memory:  unable to assess  Judgement:  Other:  unable to assess  Insight:  unable to assess  Psychomotor Activity:  Normal  Concentration:  Concentration: unable to assess  Recall:  unable to assess  Fund of Knowledge:  unable to assess  Language:  unable to assess  Akathisia:  unable to assess  Handed:  Right  AIMS (if indicated):     Assets:  Social Support  ADL's:  Unable to assess   Cognition:  Unable to assess   Sleep:        Treatment Plan Summary: Patient discussed with Dr. Dwyane Dee.  Per medical record patient refusing oral medication and treatment interventions at times. Regarding capacity: 1. patient does not appear to be oriented, patient does not meaningfully participate in evaluation. 2.  Patient  does not appear to understand current condition, patient does not appear to understand reason for hospital admission. 3.  Patient does not appear to understand risk involved when refusing treatment at this time. Patient does not appear to have capacity to make decisions regarding care at this time.  Recommend please contact next of kin or appropriate hospital administrator to assist with decision-making process.  Per Dr. Posey Pronto patient's mother assisting with decision-making currently. Regarding patient's refusal of care/intervention: Patient does not currently appear to exhibit symptoms of psychosis, patient does not exhibit symptoms of paranoia.  No additional antipsychotic medications recommended at this time, consider reevaluate EKG as current QTC 507.   Disposition: No evidence of imminent risk to self or others at present.    Emmaline Kluver, FNP 04/17/2020 8:56 AM

## 2020-04-17 NOTE — Progress Notes (Signed)
PROGRESS NOTE    Joshua [name]  JTT:[id number] DOB: Sep 13, 1981 DOA: 09/09/2019 PCP: Patient, [name]   Brief Narrative: 39 year old African-American male admitted by neurosurgery on 09/09/2019 for seizure, right hemiplegia and decreased responsiveness found to have left hemispheric intracranial hemorrhage, intubated admitted to ICU. Patient underwent craniotomy for decompression on 09/09/2019 by Dr. Vertell Limber, patient showed slow neurological recovery and prolonged hospitalization due to seizure and aspiration pneumonia.Patient continued to be awake but significant dysphagia, PEG tube was placed on 09/25/2019. Placement has been challenging as patient is uninsured and needs long-term care. CT abdomen pelvis 1/11 was negative for obstruction. On 03/19/2020 the patient's PEG tube sustained damage externally. Nursing has confirmed that it is still functional and can be clamped and flushed without complication. Has been refusing care blood draws examination.Palliative care has been consulted to help reconcile the patient's refusal of all interventions or exams with his full code status. on PEG tube feeding and meds.Awaiting on placement, has been challenging due to the fact that he is uninsured and may need long-term placement and is refusing to participate in his care.  Subjective: Continues to refuse exam continues to refuse evaluation despite multiple attempts.  Assessment & Plan:   Left Intracranial hemorrhage, likely from uncontrolled hypertension status post decompressive craniectomy and hematoma evacuation 09/09/19.CT head no vascular malformation.  Patient remains aphasic and right hemiplegia with cognitive impairment.  Refusing to participate in the care: Able to eat some this morning.  Has been refusing oral medication blood draws.  Palliative care was consulted to help reconcile patient's refusal for care, mother wants full code.We will consult psychiatry for eval. discussed with the patient and  also nursing staff encouraged to participate for blood draws and to take his medication.  His care is going to be challenging and will need to reach out to family/mother in [facility], brother locally. Psychiatry consulted.  Currently does not appear to have any acute psychiatric illness which will lead to patient's refusal and care participation. Patient is refusing his seizure medicine and remains at risk for recurrent seizures.  CKD stage III,last creatinine 1.23 back in January patient has been refusing blood work.  Seizure disorder: Continue Keppra, use ativan if seizure. Refused Keppra this morning. asked RN to reattempt.  Hypertension: Blood pressure is well controlled.  On amlodipine repeat this morning  Klebsiella UTI -was treated with antibiotics previously Acute respiratory failure: Resolved Aspiration pneumonia/chest congestion: Resolved  Nutrition: s/p PEG tube placement, on tube feeding. On 03/19/2020 the patient's PEG tube sustained damage externally. Nursing has confirmed that it is still functional and can be clamped and flushed without complication. It is still in place.  DVT Prophylaxis: Lovenox CODE STATUS: Full Code.  Family communication/goals of care:palliative care consulted to help reconcile patient's refusal for care and blood draws and medication. Family desires full code at this time.  Status is: Inpatient  Remains inpatient appropriate because:Unsafe d/c plan Dispo: The patient is from: Home              Anticipated d/c is to: SNF              Anticipated d/c date is: > 3 days              Patient currently is medically stable to d/c.  Consultants:   Trauma  PCCM  Neurosurgery  Neurology  Procedures:   Antimicrobials:  Anti-infectives (From admission, onward)   Start     Dose/Rate Route Frequency Ordered Stop   09/27/19 1130  cephALEXin (KEFLEX) 250 MG/5ML suspension 500 mg     500 mg Per Tube Every 12 hours 09/27/19 1052 09/29/19 2250   09/25/19 1445   ceFAZolin (ANCEF) IVPB 2g/100 mL premix  Status:  Discontinued     2 g 200 mL/hr over 30 Minutes Intravenous Every 12 hours 09/25/19 1417 09/27/19 1052   09/14/19 1000  Ampicillin-Sulbactam (UNASYN) 3 g in sodium chloride 0.9 % 100 mL IVPB     3 g 200 mL/hr over 30 Minutes Intravenous Every 8 hours 09/14/19 0922 09/17/19 1816   09/13/19 1100  vancomycin (VANCOCIN) IVPB 750 mg/150 ml premix  Status:  Discontinued     750 mg 150 mL/hr over 60 Minutes Intravenous Every 24 hours 09/13/19 0728 09/13/19 0838   09/12/19 1100  vancomycin (VANCOCIN) IVPB 1000 mg/200 mL premix  Status:  Discontinued     1,000 mg 200 mL/hr over 60 Minutes Intravenous Every 24 hours 09/11/19 1034 09/13/19 0728   09/11/19 1130  piperacillin-tazobactam (ZOSYN) IVPB 3.375 g  Status:  Discontinued     3.375 g 12.5 mL/hr over 240 Minutes Intravenous Every 8 hours 09/11/19 1054 09/14/19 0845   09/11/19 1100  vancomycin (VANCOCIN) 1,500 mg in sodium chloride 0.9 % 500 mL IVPB     1,500 mg 250 mL/hr over 120 Minutes Intravenous  Once 09/11/19 1034 09/11/19 1900   09/11/19 1015  piperacillin-tazobactam (ZOSYN) IVPB 4.5 g  Status:  Discontinued     4.5 g 200 mL/hr over 30 Minutes Intravenous Every 8 hours 09/11/19 1010 09/11/19 1052   09/09/19 0845  ceFAZolin (ANCEF) IVPB 2g/100 mL premix     2 g 200 mL/hr over 30 Minutes Intravenous Every 8 hours 09/09/19 0842 09/09/19 1658     Objective: Vitals:   04/17/20 0800 04/17/20 1105 04/17/20 1611 04/17/20 2014  BP: (!) 135/101 (!) 139/116 140/78 (!) 140/105  Pulse: 83 79 98 89  Resp: 16 18 20 16   Temp: 98.6 F (37 C) 98.9 F (37.2 C) 97.9 F (36.6 C) (!) 97.4 F (36.3 C)  TempSrc: Oral Oral    SpO2: 100% 100% 100% 100%  Weight:      Height:        Intake/Output Summary (Last 24 hours) at 04/17/2020 2041 Last data filed at 04/17/2020 0851 Gross per 24 hour  Intake 120 ml  Output --  Net 120 ml   Filed Weights   03/17/20 0500 03/21/20 0338 04/07/20 0340  Weight:  62 kg 63.1 kg 63 kg    Examination: No exam performed due to patient's persistent refusal.  Data Reviewed: I have personally reviewed following labs and imaging studies  CBC: No results for input(s): WBC, NEUTROABS, HGB, HCT, MCV, PLT in the last 168 hours. Basic Metabolic Panel: No results for input(s): NA, K, CL, CO2, GLUCOSE, BUN, CREATININE, CALCIUM, MG, PHOS in the last 168 hours. GFR: CrCl cannot be calculated (Patient's most recent lab result is older than the maximum 21 days allowed.). Liver Function Tests: No results for input(s): AST, ALT, ALKPHOS, BILITOT, PROT, ALBUMIN in the last 168 hours. No results for input(s): LIPASE, AMYLASE in the last 168 hours. No results for input(s): AMMONIA in the last 168 hours. Coagulation Profile: No results for input(s): INR, PROTIME in the last 168 hours. Cardiac Enzymes: No results for input(s): CKTOTAL, CKMB, CKMBINDEX, TROPONINI in the last 168 hours. BNP (last 3 results) No results for input(s): PROBNP in the last 8760 hours. HbA1C: No results for input(s): HGBA1C in the last 72 hours.  CBG: Recent Labs  Lab 04/17/20 0417 04/17/20 0753 04/17/20 1134 04/17/20 1622 04/17/20 2017  GLUCAP 86 95 72 104* 127*   Lipid Profile: No results for input(s): CHOL, HDL, LDLCALC, TRIG, CHOLHDL, LDLDIRECT in the last 72 hours. Thyroid Function Tests: No results for input(s): TSH, T4TOTAL, FREET4, T3FREE, THYROIDAB in the last 72 hours. Anemia Panel: No results for input(s): VITAMINB12, FOLATE, FERRITIN, TIBC, IRON, RETICCTPCT in the last 72 hours. Sepsis Labs: No results for input(s): PROCALCITON, LATICACIDVEN in the last 168 hours.  No results found for this or any previous visit (from the past 240 hour(s)).    Radiology Studies: No results found.   Scheduled Meds: . amLODipine  10 mg Per Tube Daily  . chlorhexidine  15 mL Mouth Rinse BID  . cholecalciferol  1,000 Units Per Tube Daily  . enoxaparin (LOVENOX) injection  40 mg  Subcutaneous Daily  . feeding supplement (JEVITY 1.2 CAL)  474 mL Per Tube 4x daily  . feeding supplement (PRO-STAT SUGAR FREE 64)  30 mL Per Tube QID  . folic acid  1 mg Per Tube Daily  . free water  200 mL Per Tube Q4H  . hydrALAZINE  25 mg Per Tube Q6H  . insulin aspart  0-15 Units Subcutaneous Q4H  . levETIRAcetam  500 mg Oral BID  . mouth rinse  15 mL Mouth Rinse q12n4p  . metoprolol tartrate  25 mg Per Tube BID  . multivitamin  15 mL Per Tube Daily  . polyethylene glycol  17 g Per Tube BID  . QUEtiapine  50 mg Per Tube QHS  . thiamine  100 mg Per Tube Daily   Continuous Infusions:   LOS: 221 days    Time spent: 25 min    Berle Mull, MD [facility] If 7PM-7AM, please contact night-coverage

## 2020-04-18 DIAGNOSIS — I611 Nontraumatic intracerebral hemorrhage in hemisphere, cortical: Secondary | ICD-10-CM | POA: Diagnosis not present

## 2020-04-18 LAB — GLUCOSE, CAPILLARY
Glucose-Capillary: 108 mg/dL — ABNORMAL HIGH (ref 70–99)
Glucose-Capillary: 167 mg/dL — ABNORMAL HIGH (ref 70–99)
Glucose-Capillary: 92 mg/dL (ref 70–99)
Glucose-Capillary: 99 mg/dL (ref 70–99)

## 2020-04-18 NOTE — Progress Notes (Signed)
PROGRESS NOTE    Joshua [name]  BJY:[id number] DOB: [birth year] DOA: 09/09/2019 PCP: Patient, [name]   Brief Narrative: 39 year old African-American male admitted by neurosurgery on 09/09/2019 for seizure, right hemiplegia and decreased responsiveness found to have left hemispheric intracranial hemorrhage, intubated admitted to ICU. Patient underwent craniotomy for decompression on 09/09/2019 by Dr. Vertell Limber, patient showed slow neurological recovery and prolonged hospitalization due to seizure and aspiration pneumonia.Patient continued to be awake but significant dysphagia, PEG tube was placed on 09/25/2019. Placement has been challenging as patient is uninsured and needs long-term care. CT abdomen pelvis 1/11 was negative for obstruction. On 03/19/2020 the patient's PEG tube sustained damage externally. Nursing has confirmed that it is still functional and can be clamped and flushed without complication. Has been refusing care blood draws examination.Palliative care has been consulted to help reconcile the patient's refusal of all interventions or exams with his full code status. on PEG tube feeding and meds.Awaiting on placement, has been challenging due to the fact that he is uninsured and may need long-term placement and is refusing to participate in his care.  Subjective: Patient does not verbalize anything but nods his head.  Refused his lab in the morning. Denied having any acute complaint of pain, shortness of breath, nausea, headache. When I ask whether I can examine him or not he raised his hand in refusal.  Assessment & Plan:   Left Intracranial hemorrhage, likely from uncontrolled hypertension status post decompressive craniectomy and hematoma evacuation 09/09/19.CT head no vascular malformation.  Patient remains aphasic and right hemiplegia with cognitive impairment.  Refusing to participate in the care: Able to eat some this morning.  Has been refusing oral medication blood draws.   Palliative care was consulted to help reconcile patient's refusal for care, mother wants full code.We will consult psychiatry for eval. discussed with the patient and also nursing staff encouraged to participate for blood draws and to take his medication.  His care is going to be challenging and will need to reach out to family/mother in [place] [facility], brother locally. Psychiatry consulted.  Currently does not appear to have any acute psychiatric illness which will lead to patient's refusal and care participation. Patient is refusing his seizure medicine and remains at risk for recurrent seizures.  CKD stage III,last creatinine 1.23 back in January patient has been refusing blood work.  Seizure disorder: Continue Keppra, use ativan if seizure. Intermittently refusing medicines.  At risk for significant seizure issues.  Hypertension: Blood pressure is well controlled.  On amlodipine Intermittently refusing medicine.  Blood pressure stable.  Klebsiella UTI -was treated with antibiotics previously Acute respiratory failure: Resolved Aspiration pneumonia/chest congestion: Resolved  Nutrition: s/p PEG tube placement, on tube feeding. On 03/19/2020 the patient's PEG tube sustained damage externally. Nursing has confirmed that it is still functional and can be clamped and flushed without complication. It is still in place.  DVT Prophylaxis: Lovenox CODE STATUS: Full Code.  Family communication/goals of care:palliative care consulted to help reconcile patient's refusal for care and blood draws and medication. Family desires full code at this time.  Status is: Inpatient  Remains inpatient appropriate because:Unsafe d/c plan Dispo: The patient is from: Home              Anticipated d/c is to: SNF              Anticipated d/c date is: > 3 days              Patient currently is medically stable  to d/c.  Consultants:   Trauma  PCCM  Neurosurgery  Neurology  Procedures:   Antimicrobials:    Anti-infectives (From admission, onward)   Start     Dose/Rate Route Frequency Ordered Stop   09/27/19 1130  cephALEXin (KEFLEX) 250 MG/5ML suspension 500 mg     500 mg Per Tube Every 12 hours 09/27/19 1052 09/29/19 2250   09/25/19 1445  ceFAZolin (ANCEF) IVPB 2g/100 mL premix  Status:  Discontinued     2 g 200 mL/hr over 30 Minutes Intravenous Every 12 hours 09/25/19 1417 09/27/19 1052   09/14/19 1000  Ampicillin-Sulbactam (UNASYN) 3 g in sodium chloride 0.9 % 100 mL IVPB     3 g 200 mL/hr over 30 Minutes Intravenous Every 8 hours 09/14/19 0922 09/17/19 1816   09/13/19 1100  vancomycin (VANCOCIN) IVPB 750 mg/150 ml premix  Status:  Discontinued     750 mg 150 mL/hr over 60 Minutes Intravenous Every 24 hours 09/13/19 0728 09/13/19 0838   09/12/19 1100  vancomycin (VANCOCIN) IVPB 1000 mg/200 mL premix  Status:  Discontinued     1,000 mg 200 mL/hr over 60 Minutes Intravenous Every 24 hours 09/11/19 1034 09/13/19 0728   09/11/19 1130  piperacillin-tazobactam (ZOSYN) IVPB 3.375 g  Status:  Discontinued     3.375 g 12.5 mL/hr over 240 Minutes Intravenous Every 8 hours 09/11/19 1054 09/14/19 0845   09/11/19 1100  vancomycin (VANCOCIN) 1,500 mg in sodium chloride 0.9 % 500 mL IVPB     1,500 mg 250 mL/hr over 120 Minutes Intravenous  Once 09/11/19 1034 09/11/19 1900   09/11/19 1015  piperacillin-tazobactam (ZOSYN) IVPB 4.5 g  Status:  Discontinued     4.5 g 200 mL/hr over 30 Minutes Intravenous Every 8 hours 09/11/19 1010 09/11/19 1052   09/09/19 0845  ceFAZolin (ANCEF) IVPB 2g/100 mL premix     2 g 200 mL/hr over 30 Minutes Intravenous Every 8 hours 09/09/19 0842 09/09/19 1658     Objective: Vitals:   04/18/20 0402 04/18/20 0755 04/18/20 1146 04/18/20 1555  BP: (!) 142/112 (!) 127/101 (!) 132/95 131/82  Pulse: 89 84 86 78  Resp: 16 16 18 18   Temp: 98 F (36.7 C) 98.4 F (36.9 C) 98.5 F (36.9 C) 99 F (37.2 C)  TempSrc:  Oral Oral Oral  SpO2: 98% 100% 100% 100%  Weight:       Height:        Intake/Output Summary (Last 24 hours) at 04/18/2020 1927 Last data filed at 04/18/2020 1844 Gross per 24 hour  Intake 472 ml  Output --  Net 472 ml   Filed Weights   03/17/20 0500 03/21/20 0338 04/07/20 0340  Weight: 62 kg 63.1 kg 63 kg    Examination: No exam performed due to patient's persistent refusal.  Data Reviewed: I have personally reviewed following labs and imaging studies  CBC: No results for input(s): WBC, NEUTROABS, HGB, HCT, MCV, PLT in the last 168 hours. Basic Metabolic Panel: No results for input(s): NA, K, CL, CO2, GLUCOSE, BUN, CREATININE, CALCIUM, MG, PHOS in the last 168 hours. GFR: CrCl cannot be calculated (Patient's most recent lab result is older than the maximum 21 days allowed.). Liver Function Tests: No results for input(s): AST, ALT, ALKPHOS, BILITOT, PROT, ALBUMIN in the last 168 hours. No results for input(s): LIPASE, AMYLASE in the last 168 hours. No results for input(s): AMMONIA in the last 168 hours. Coagulation Profile: No results for input(s): INR, PROTIME in the last 168 hours.  Cardiac Enzymes: No results for input(s): CKTOTAL, CKMB, CKMBINDEX, TROPONINI in the last 168 hours. BNP (last 3 results) No results for input(s): PROBNP in the last 8760 hours. HbA1C: No results for input(s): HGBA1C in the last 72 hours. CBG: Recent Labs  Lab 04/17/20 1622 04/17/20 2017 04/18/20 0016 04/18/20 1150 04/18/20 1558  GLUCAP 104* 127* 99 167* 92   Lipid Profile: No results for input(s): CHOL, HDL, LDLCALC, TRIG, CHOLHDL, LDLDIRECT in the last 72 hours. Thyroid Function Tests: No results for input(s): TSH, T4TOTAL, FREET4, T3FREE, THYROIDAB in the last 72 hours. Anemia Panel: No results for input(s): VITAMINB12, FOLATE, FERRITIN, TIBC, IRON, RETICCTPCT in the last 72 hours. Sepsis Labs: No results for input(s): PROCALCITON, LATICACIDVEN in the last 168 hours.  No results found for this or any previous visit (from the past  240 hour(s)).    Radiology Studies: No results found.   Scheduled Meds: . amLODipine  10 mg Per Tube Daily  . chlorhexidine  15 mL Mouth Rinse BID  . cholecalciferol  1,000 Units Per Tube Daily  . enoxaparin (LOVENOX) injection  40 mg Subcutaneous Daily  . feeding supplement (JEVITY 1.2 CAL)  474 mL Per Tube 4x daily  . feeding supplement (PRO-STAT SUGAR FREE 64)  30 mL Per Tube QID  . folic acid  1 mg Per Tube Daily  . free water  200 mL Per Tube Q4H  . hydrALAZINE  25 mg Per Tube Q6H  . insulin aspart  0-15 Units Subcutaneous Q4H  . levETIRAcetam  500 mg Oral BID  . mouth rinse  15 mL Mouth Rinse q12n4p  . metoprolol tartrate  25 mg Per Tube BID  . multivitamin  15 mL Per Tube Daily  . polyethylene glycol  17 g Per Tube BID  . QUEtiapine  50 mg Per Tube QHS  . thiamine  100 mg Per Tube Daily   Continuous Infusions:   LOS: 222 days    Time spent: 25 min    Berle Mull, MD [facility] If 7PM-7AM, please contact night-coverage

## 2020-04-18 NOTE — Progress Notes (Addendum)
Refuses assessment, meds, nutrition/hydration this shift. Oriented to self and situation. Nods and gestures appropriately to questioning. Notified of increased BP and encouraged to take antihypertensive medication along with antiseizure medications but he again refuses. Resting in bed. Pleasant but uncooperative with attempts at care.

## 2020-04-19 DIAGNOSIS — I611 Nontraumatic intracerebral hemorrhage in hemisphere, cortical: Secondary | ICD-10-CM | POA: Diagnosis not present

## 2020-04-19 LAB — GLUCOSE, CAPILLARY
Glucose-Capillary: 100 mg/dL — ABNORMAL HIGH (ref 70–99)
Glucose-Capillary: 102 mg/dL — ABNORMAL HIGH (ref 70–99)
Glucose-Capillary: 105 mg/dL — ABNORMAL HIGH (ref 70–99)
Glucose-Capillary: 109 mg/dL — ABNORMAL HIGH (ref 70–99)
Glucose-Capillary: 110 mg/dL — ABNORMAL HIGH (ref 70–99)
Glucose-Capillary: 112 mg/dL — ABNORMAL HIGH (ref 70–99)
Glucose-Capillary: 118 mg/dL — ABNORMAL HIGH (ref 70–99)
Glucose-Capillary: 124 mg/dL — ABNORMAL HIGH (ref 70–99)
Glucose-Capillary: 125 mg/dL — ABNORMAL HIGH (ref 70–99)
Glucose-Capillary: 127 mg/dL — ABNORMAL HIGH (ref 70–99)
Glucose-Capillary: 129 mg/dL — ABNORMAL HIGH (ref 70–99)
Glucose-Capillary: 80 mg/dL (ref 70–99)
Glucose-Capillary: 80 mg/dL (ref 70–99)
Glucose-Capillary: 80 mg/dL (ref 70–99)
Glucose-Capillary: 80 mg/dL (ref 70–99)
Glucose-Capillary: 81 mg/dL (ref 70–99)
Glucose-Capillary: 82 mg/dL (ref 70–99)
Glucose-Capillary: 85 mg/dL (ref 70–99)
Glucose-Capillary: 86 mg/dL (ref 70–99)
Glucose-Capillary: 86 mg/dL (ref 70–99)
Glucose-Capillary: 87 mg/dL (ref 70–99)
Glucose-Capillary: 89 mg/dL (ref 70–99)
Glucose-Capillary: 91 mg/dL (ref 70–99)
Glucose-Capillary: 91 mg/dL (ref 70–99)
Glucose-Capillary: 92 mg/dL (ref 70–99)
Glucose-Capillary: 93 mg/dL (ref 70–99)
Glucose-Capillary: 93 mg/dL (ref 70–99)
Glucose-Capillary: 93 mg/dL (ref 70–99)
Glucose-Capillary: 95 mg/dL (ref 70–99)
Glucose-Capillary: 95 mg/dL (ref 70–99)
Glucose-Capillary: 97 mg/dL (ref 70–99)
Glucose-Capillary: 98 mg/dL (ref 70–99)
Glucose-Capillary: 99 mg/dL (ref 70–99)

## 2020-04-19 MED ORDER — LABETALOL HCL 5 MG/ML IV SOLN: 5.0000 mg | INTRAVENOUS | Status: DC | PRN

## 2020-04-19 NOTE — Progress Notes (Signed)
[facility] PROGRESS NOTE  Patient: Joshua [name] ZYT:[id number]   PCP: Patient, [name] DOB: [birth year]   DOA: 09/09/2019   DOS: 04/19/2020    Subjective: Continues to refuse examination.  Denies any acute complaint.  No acute events overnight.  Still when attempting to examine he will raise his hand and refusal and will push  Objective:  Vitals:   04/19/20 1111 04/19/20 1639  BP: (!) 112/94 (!) 135/106  Pulse: 75 79  Resp: 17 17  Temp: 98.2 F (36.8 C) 97.9 F (36.6 C)  SpO2: 100% 100%    Unable to complete due to patient's lack of cooperation.  Moving all extremities appropriately.  Nonverbal.  Assessment and plan: Left ICH. Currently stable. CKD. Unable to check renal function. Seizure disorder. Continue Keppra.  Intermittently taking medication. Essential hypertension. Blood pressure stable.  Monitor.  Author: Berle Mull, MD [facility] 04/19/2020 7:22 PM   If 7PM-7AM, please contact night-coverage at [email or web address]

## 2020-04-20 DIAGNOSIS — I611 Nontraumatic intracerebral hemorrhage in hemisphere, cortical: Secondary | ICD-10-CM | POA: Diagnosis not present

## 2020-04-20 NOTE — Progress Notes (Addendum)
[facility] PROGRESS NOTE  Patient: Joshua [name] TKK:[id number]   PCP: Patient, [name] DOB: 04-12-1981   DOA: 09/09/2019   DOS: 04/20/2020    Subjective: Continues to refuse examination.  No acute events overnight.  Objective:  Vitals:   04/20/20 0840 04/20/20 1233  BP: (!) 118/98 110/79  Pulse: 88 98  Resp: 17 18  Temp: 97.7 F (36.5 C) 98.6 F (37 C)  SpO2: 100% 97%    Unable to complete examination.  No tremor in the left remedies appropriately.  Nonverbal.  More tired today.  Assessment and plan: Left ICH. Currently stable.  CKD. Unable to check renal function.  Seizure disorder. Continue Keppra.  Intermittently taking medication.  Essential hypertension. Blood pressure stable.  Monitor.  Attempt to reach the mother on the phone number listed in demographics.  Left a voicemail.  Author: Berle Mull, MD [facility] 04/20/2020 5:07 PM   If 7PM-7AM, please contact night-coverage at [email or web address]

## 2020-04-21 DIAGNOSIS — I611 Nontraumatic intracerebral hemorrhage in hemisphere, cortical: Secondary | ICD-10-CM | POA: Diagnosis not present

## 2020-04-21 NOTE — Progress Notes (Signed)
[facility] PROGRESS NOTE  Patient: Joshua [name] [id number]:[id number]   PCP: Patient, [name] DOB: 1981/08/28   DOA: 09/09/2019   DOS: 04/21/2020    Subjective: No nausea no vomiting no fever no chills.  Objective:  Vitals:   04/21/20 1158 04/21/20 1551  BP: 112/89 (!) 120/95  Pulse: 95 (!) 102  Resp: 18 18  Temp: 97.7 F (36.5 C) 98.3 F (36.8 C)  SpO2: 99% 100%    Unable to complete examination.  No tremor  Nonverbal.  .  Assessment and plan: Left ICH. Currently stable.  CKD. Unable to check renal function.  Seizure disorder. Continue Keppra.  Intermittently taking medication.  Essential hypertension. Blood pressure stable.  Monitor  Author: Berle Mull, MD [facility] 04/21/2020 5:36 PM   If 7PM-7AM, please contact night-coverage at [email or web address]

## 2020-04-22 DIAGNOSIS — I611 Nontraumatic intracerebral hemorrhage in hemisphere, cortical: Secondary | ICD-10-CM | POA: Diagnosis not present

## 2020-04-22 LAB — GLUCOSE, CAPILLARY
Glucose-Capillary: 101 mg/dL — ABNORMAL HIGH (ref 70–99)
Glucose-Capillary: 101 mg/dL — ABNORMAL HIGH (ref 70–99)
Glucose-Capillary: 106 mg/dL — ABNORMAL HIGH (ref 70–99)
Glucose-Capillary: 120 mg/dL — ABNORMAL HIGH (ref 70–99)
Glucose-Capillary: 78 mg/dL (ref 70–99)
Glucose-Capillary: 78 mg/dL (ref 70–99)
Glucose-Capillary: 79 mg/dL (ref 70–99)
Glucose-Capillary: 86 mg/dL (ref 70–99)
Glucose-Capillary: 86 mg/dL (ref 70–99)
Glucose-Capillary: 86 mg/dL (ref 70–99)
Glucose-Capillary: 88 mg/dL (ref 70–99)
Glucose-Capillary: 89 mg/dL (ref 70–99)
Glucose-Capillary: 90 mg/dL (ref 70–99)
Glucose-Capillary: 93 mg/dL (ref 70–99)
Glucose-Capillary: 97 mg/dL (ref 70–99)
Glucose-Capillary: 99 mg/dL (ref 70–99)
Glucose-Capillary: 99 mg/dL (ref 70–99)
Glucose-Capillary: 99 mg/dL (ref 70–99)

## 2020-04-22 NOTE — Progress Notes (Signed)
Patient is refusing to take his medications and have his assessment completed. Patient is also refusing to eat anything from his tray.

## 2020-04-22 NOTE — Progress Notes (Signed)
Attempted to give patient his tube feeds and medications. Patient is still refusing.

## 2020-04-22 NOTE — Progress Notes (Addendum)
PROGRESS NOTE    Joshua [name]  QMG:[id number] DOB: 10/09/1981 DOA: 09/09/2019 PCP: Patient, [name]   Brief Narrative: 39 year old African-American male admitted by neurosurgery on 09/09/2019 for seizure, right hemiplegia and decreased responsiveness found to have left hemispheric intracranial hemorrhage, intubated admitted to ICU. Patient underwent craniotomy for decompression on 09/09/2019 by Dr. Vertell Limber, patient showed slow neurological recovery and prolonged hospitalization due to seizure and aspiration pneumonia.Patient continued to be awake but significant dysphagia, PEG tube was placed on 09/25/2019. Placement has been challenging as patient is uninsured and needs long-term care. CT abdomen pelvis 1/11 was negative for obstruction. On 03/19/2020 the patient's PEG tube sustained damage externally. Nursing has confirmed that it is still functional and can be clamped and flushed without complication. Has been refusing care blood draws examination.Palliative care has been consulted to help reconcile the patient's refusal of all interventions or exams with his full code status. on PEG tube feeding and meds.Awaiting on placement, has been challenging due to the fact that he is uninsured and may need long-term placement and is refusing to participate in his care.  Subjective: Does not allow any examination.  Nods no for every question asked.  No acute events overnight.  Assessment & Plan:   Left Intracranial hemorrhage, likely from uncontrolled hypertension status post decompressive craniectomy and hematoma evacuation 09/09/19.CT head no vascular malformation.  Patient remains aphasic and right hemiplegia with cognitive impairment.  Refusing to participate in the care: Able to eat some this morning.  Has been refusing oral medication blood draws.  Palliative care was consulted to help reconcile patient's refusal for care, mother wants full code.We will consult psychiatry for eval. discussed with the  patient and also nursing staff encouraged to participate for blood draws and to take his medication.  His care is going to be challenging Unable to reach mother-   CKD stage IIIa last creatinine 1.23 back in January patient has been refusing blood work.  Seizure disorder: Continue Keppra, use ativan if seizure. Refused Keppra this morning. asked RN to reattempt.  Hypertension: Blood pressure is well controlled.  On amlodipine repeat this morning  Klebsiella UTI -was treated with antibiotics previously Acute respiratory failure: Resolved Aspiration pneumonia/chest congestion: Resolved  Nutrition: s/p PEG tube placement, on tube feeding. On 03/19/2020 the patient's PEG tube sustained damage externally. Nursing has confirmed that it is still functional and can be clamped and flushed without complication. It is still in place.  DVT Prophylaxis: Lovenox CODE STATUS: Full Code.  Family communication/goals of care:palliative care consulted, no family at bedside. Called mother on 04/22/2020, left a voice mail.    Status is: Inpatient  Remains inpatient appropriate because:Unsafe d/c plan Dispo: The patient is from: Home              Anticipated d/c is to: SNF              Anticipated d/c date is: > 3 days              Patient currently is medically stable to d/c.  Consultants:   Trauma  PCCM  Neurosurgery  Neurology  Procedures:   Antimicrobials:  Anti-infectives (From admission, onward)   Start     Dose/Rate Route Frequency Ordered Stop   09/27/19 1130  cephALEXin (KEFLEX) 250 MG/5ML suspension 500 mg     500 mg Per Tube Every 12 hours 09/27/19 1052 09/29/19 2250   09/25/19 1445  ceFAZolin (ANCEF) IVPB 2g/100 mL premix  Status:  Discontinued  2 g 200 mL/hr over 30 Minutes Intravenous Every 12 hours 09/25/19 1417 09/27/19 1052   09/14/19 1000  Ampicillin-Sulbactam (UNASYN) 3 g in sodium chloride 0.9 % 100 mL IVPB     3 g 200 mL/hr over 30 Minutes Intravenous Every 8 hours  09/14/19 0922 09/17/19 1816   09/13/19 1100  vancomycin (VANCOCIN) IVPB 750 mg/150 ml premix  Status:  Discontinued     750 mg 150 mL/hr over 60 Minutes Intravenous Every 24 hours 09/13/19 0728 09/13/19 0838   09/12/19 1100  vancomycin (VANCOCIN) IVPB 1000 mg/200 mL premix  Status:  Discontinued     1,000 mg 200 mL/hr over 60 Minutes Intravenous Every 24 hours 09/11/19 1034 09/13/19 0728   09/11/19 1130  piperacillin-tazobactam (ZOSYN) IVPB 3.375 g  Status:  Discontinued     3.375 g 12.5 mL/hr over 240 Minutes Intravenous Every 8 hours 09/11/19 1054 09/14/19 0845   09/11/19 1100  vancomycin (VANCOCIN) 1,500 mg in sodium chloride 0.9 % 500 mL IVPB     1,500 mg 250 mL/hr over 120 Minutes Intravenous  Once 09/11/19 1034 09/11/19 1900   09/11/19 1015  piperacillin-tazobactam (ZOSYN) IVPB 4.5 g  Status:  Discontinued     4.5 g 200 mL/hr over 30 Minutes Intravenous Every 8 hours 09/11/19 1010 09/11/19 1052   09/09/19 0845  ceFAZolin (ANCEF) IVPB 2g/100 mL premix     2 g 200 mL/hr over 30 Minutes Intravenous Every 8 hours 09/09/19 0842 09/09/19 1658     Objective: Vitals:   04/22/20 0437 04/22/20 0811 04/22/20 1231 04/22/20 1519  BP: (!) 121/97 (!) 130/99 (!) 136/103 (!) 134/104  Pulse: 79 83 68 75  Resp: 16 15 16 19   Temp: (!) 97.3 F (36.3 C) 97.6 F (36.4 C) 98.1 F (36.7 C) 98 F (36.7 C)  TempSrc: Oral Oral Oral Oral  SpO2: 98% 100% 100% 100%  Weight:      Height:        Intake/Output Summary (Last 24 hours) at 04/22/2020 1907 Last data filed at 04/22/2020 1720 Gross per 24 hour  Intake 200 ml  Output 0 ml  Net 200 ml   Filed Weights   03/17/20 0500 03/21/20 0338 04/07/20 0340  Weight: 62 kg 63.1 kg 63 kg    Examination: Patient refuses examination.  Spontaneously Extremities.  Data Reviewed: I have personally reviewed following labs and imaging studies  CBC: No results for input(s): WBC, NEUTROABS, HGB, HCT, MCV, PLT in the last 168 hours. Basic Metabolic  Panel: No results for input(s): NA, K, CL, CO2, GLUCOSE, BUN, CREATININE, CALCIUM, MG, PHOS in the last 168 hours. GFR: CrCl cannot be calculated (Patient's most recent lab result is older than the maximum 21 days allowed.). Liver Function Tests: No results for input(s): AST, ALT, ALKPHOS, BILITOT, PROT, ALBUMIN in the last 168 hours. No results for input(s): LIPASE, AMYLASE in the last 168 hours. No results for input(s): AMMONIA in the last 168 hours. Coagulation Profile: No results for input(s): INR, PROTIME in the last 168 hours. Cardiac Enzymes: No results for input(s): CKTOTAL, CKMB, CKMBINDEX, TROPONINI in the last 168 hours. BNP (last 3 results) No results for input(s): PROBNP in the last 8760 hours. HbA1C: No results for input(s): HGBA1C in the last 72 hours. CBG: Recent Labs  Lab 04/21/20 2348 04/22/20 0432 04/22/20 0813 04/22/20 1233 04/22/20 1518  GLUCAP 120* 86 93 88 78   Lipid Profile: No results for input(s): CHOL, HDL, LDLCALC, TRIG, CHOLHDL, LDLDIRECT in the last 72 hours.  Thyroid Function Tests: No results for input(s): TSH, T4TOTAL, FREET4, T3FREE, THYROIDAB in the last 72 hours. Anemia Panel: No results for input(s): VITAMINB12, FOLATE, FERRITIN, TIBC, IRON, RETICCTPCT in the last 72 hours. Sepsis Labs: No results for input(s): PROCALCITON, LATICACIDVEN in the last 168 hours.  No results found for this or any previous visit (from the past 240 hour(s)).    Radiology Studies: No results found.   Scheduled Meds: . amLODipine  10 mg Per Tube Daily  . chlorhexidine  15 mL Mouth Rinse BID  . enoxaparin (LOVENOX) injection  40 mg Subcutaneous Daily  . feeding supplement (JEVITY 1.2 CAL)  474 mL Per Tube 4x daily  . feeding supplement (PRO-STAT SUGAR FREE 64)  30 mL Per Tube QID  . free water  200 mL Per Tube Q4H  . hydrALAZINE  25 mg Per Tube Q6H  . insulin aspart  0-15 Units Subcutaneous Q4H  . levETIRAcetam  500 mg Oral BID  . mouth rinse  15 mL Mouth  Rinse q12n4p  . metoprolol tartrate  25 mg Per Tube BID  . multivitamin  15 mL Per Tube Daily  . polyethylene glycol  17 g Per Tube BID  . QUEtiapine  50 mg Per Tube QHS   Continuous Infusions:   LOS: 226 days    Time spent: 25 min    Berle Mull, MD [facility] If 7PM-7AM, please contact night-coverage

## 2020-04-23 DIAGNOSIS — J9601 Acute respiratory failure with hypoxia: Secondary | ICD-10-CM | POA: Diagnosis not present

## 2020-04-23 DIAGNOSIS — I611 Nontraumatic intracerebral hemorrhage in hemisphere, cortical: Secondary | ICD-10-CM | POA: Diagnosis not present

## 2020-04-23 LAB — GLUCOSE, CAPILLARY
Glucose-Capillary: 89 mg/dL (ref 70–99)
Glucose-Capillary: 99 mg/dL (ref 70–99)
Glucose-Capillary: 97 mg/dL (ref 70–99)
Glucose-Capillary: 86 mg/dL (ref 70–99)
Glucose-Capillary: 85 mg/dL (ref 70–99)

## 2020-04-23 NOTE — Progress Notes (Signed)
Nutrition Follow-up  DOCUMENTATION CODES:   Severe malnutrition in context of acute illness/injury  INTERVENTION:  Continueto attempt to provide Jevity 1.2 [employer] PEG at goal volume of 474 ml (2 cartons/ARCs) given QID.   Continue attempting to offer 30 ml ProstatQID per PEG tube.   Continue offering free water flushes of200 ml every 4 hours per tube. (MD to adjust as appropriate)  Tube feeding to provide 2675 kcal, 165grams of protein, and2771m free water.  Continue PO intake for comfort as appropriate/tolerated.   NUTRITION DIAGNOSIS:   Severe Malnutrition related to acute illness(intracerebral hemorrage) as evidenced by severe fat depletion, severe muscle depletion.  Ongoing.  GOAL:   Patient will meet greater than or equal to 90% of their needs  Not met, pt refusing tube feeding.   MONITOR:   TF tolerance, Skin, Weight trends, Labs, I & O's  REASON FOR ASSESSMENT:   Ventilator, Consult Enteral/tube feeding initiation and management  ASSESSMENT:   39yo male admitted with large left intracranial hemorrhage; S/P craniotomy. PMH includes HTN, alcoholism, noncompliance.  9/12left frontal temporal craniectomy and evacuation hematoma.  9/23 Extubated.  10/1 PEG placed 11/23- Nutrition Focused Physical Exam performed, severe subcutaneous fat loss and severe muscle wasting identified. 3/23 pt's PEG tube sustained damage externally, but nursing confirmed it is still functional and can be clamped and flushed without complication. It is still in place.  Discussed pt with RN. Pt is noted to be frequently refusing tube feeding boluses and pro-stat. Staff should continue to encourage tube feeding acceptance. If pt continues to refuse, GOC discussion needs to be re-adressed.   Pt is also refusing all examinations and provides minimal (if any) response to questions.    Pt with no po intake in the last week.  Current tube feeding orders: 4728mJevity 1.2  cal QID, 3038mro-stat QID, 200m48mee water Q4H  Pt has not allowed labs since 01/17/20.  CBGs [phone] wt: 63kg Admit wt: 63kg  Medications reviewed and include: Novolog, MVI, Miralax  Diet Order:   Diet Order            Diet regular Room service appropriate? Yes; Fluid consistency: Thin  Diet effective now              EDUCATION NEEDS:   Not appropriate for education at this time  Skin:  Skin Assessment: Reviewed RN Assessment Skin Integrity Issues:: Other (Comment) Other: skin tear right hip  Last BM:  4/23  Height:   Ht Readings from Last 1 Encounters:  09/09/19 '5\' 9"'  (1.753 m)    Weight:   Wt Readings from Last 1 Encounters:  04/07/20 63 kg    BMI:  Body mass index is 20.51 kg/m.  Estimated Nutritional Needs:   Kcal:  [id number]:  130-170 grams  Fluid:  >/= 2 L/day   AmanLarkin Ina, RD, LDN RD pager number and weekend/on-call pager number located in [email or web address]

## 2020-04-23 NOTE — Progress Notes (Signed)
PROGRESS NOTE    Joshua [name]  DJS:[id number] DOB: Dec 29, 1980 DOA: 09/09/2019 PCP: Patient, [name]   Brief Narrative: 39 year old African-American male admitted by neurosurgery on 09/09/2019 for seizure, right hemiplegia and decreased responsiveness found to have left hemispheric intracranial hemorrhage, intubated and admitted to ICU. Patient underwent craniotomy for decompression on 09/09/2019 by Dr. Vertell Limber, patient showed slow neurological recovery and prolonged hospitalization due to seizure and aspiration pneumonia.Patient continued to be awake but significant dysphagia, PEG tube was placed on 09/25/2019. Placement has been challenging as patient is uninsured and needs long-term care. CT abdomen pelvis 1/11 was negative for obstruction. On 03/19/2020 the patient's PEG tube sustained damage externally. Nursing has confirmed that it is still functional and can be clamped and flushed without complication.  Has been refusing care blood draws and examination.Palliative care has been consulted to help reconcile the patient's refusal of all interventions or exams with his full code status. Awaiting on placement, has been challenging due to the fact that he is uninsured and may need long-term placement and is refusing to participate in his care.  Subjective: No overnight events.  Refused tube feeding.  Refused care and refused labs. He will not let me touch him or examine him.  Assessment & Plan:   Left Intracranial hemorrhage, likely from uncontrolled hypertension status post decompressive craniectomy and hematoma evacuation 09/09/19.CT head no vascular malformation.  Patient remains aphasic and right hemiplegia with cognitive impairment.  Refusing to participate in the care: Has been refusing oral medication and blood draws.  Palliative care was consulted to help reconcile patient's refusal of care, mother wants full code.  Psychiatry had seen the patient.  They deemed patient is unable to make  his decisions and not competent to make his decisions.     CKD stage IIIa last creatinine 1.23 back in January patient has been refusing blood work.  We will try morning labs.  Seizure disorder: Continue Keppra, use ativan if seizure.   Hypertension: Blood pressure is well controlled.  On amlodipine .  Klebsiella UTI -was treated with antibiotics previously Acute respiratory failure: Resolved Aspiration pneumonia/chest congestion: Resolved  Nutrition: s/p PEG tube placement, on tube feeding. On 03/19/2020   DVT Prophylaxis: Lovenox CODE STATUS: Full Code.  Family communication/goals of care:palliative care following.   Status is: Inpatient  Remains inpatient appropriate because:Unsafe d/c plan Dispo: The patient is from: Home              Anticipated d/c is to: SNF              Anticipated d/c date is: > 3 days              Patient currently is medically stable to d/c.  Consultants:   Trauma  PCCM  Neurosurgery  Neurology  Procedures:   Antimicrobials:  Anti-infectives (From admission, onward)   Start     Dose/Rate Route Frequency Ordered Stop   09/27/19 1130  cephALEXin (KEFLEX) 250 MG/5ML suspension 500 mg     500 mg Per Tube Every 12 hours 09/27/19 1052 09/29/19 2250   09/25/19 1445  ceFAZolin (ANCEF) IVPB 2g/100 mL premix  Status:  Discontinued     2 g 200 mL/hr over 30 Minutes Intravenous Every 12 hours 09/25/19 1417 09/27/19 1052   09/14/19 1000  Ampicillin-Sulbactam (UNASYN) 3 g in sodium chloride 0.9 % 100 mL IVPB     3 g 200 mL/hr over 30 Minutes Intravenous Every 8 hours 09/14/19 0922 09/17/19 1816   09/13/19  1100  vancomycin (VANCOCIN) IVPB 750 mg/150 ml premix  Status:  Discontinued     750 mg 150 mL/hr over 60 Minutes Intravenous Every 24 hours 09/13/19 0728 09/13/19 0838   09/12/19 1100  vancomycin (VANCOCIN) IVPB 1000 mg/200 mL premix  Status:  Discontinued     1,000 mg 200 mL/hr over 60 Minutes Intravenous Every 24 hours 09/11/19 1034 09/13/19  0728   09/11/19 1130  piperacillin-tazobactam (ZOSYN) IVPB 3.375 g  Status:  Discontinued     3.375 g 12.5 mL/hr over 240 Minutes Intravenous Every 8 hours 09/11/19 1054 09/14/19 0845   09/11/19 1100  vancomycin (VANCOCIN) 1,500 mg in sodium chloride 0.9 % 500 mL IVPB     1,500 mg 250 mL/hr over 120 Minutes Intravenous  Once 09/11/19 1034 09/11/19 1900   09/11/19 1015  piperacillin-tazobactam (ZOSYN) IVPB 4.5 g  Status:  Discontinued     4.5 g 200 mL/hr over 30 Minutes Intravenous Every 8 hours 09/11/19 1010 09/11/19 1052   09/09/19 0845  ceFAZolin (ANCEF) IVPB 2g/100 mL premix     2 g 200 mL/hr over 30 Minutes Intravenous Every 8 hours 09/09/19 0842 09/09/19 1658     Objective: Vitals:   04/22/20 2000 04/22/20 2357 04/23/20 0806 04/23/20 1216  BP: (!) 133/102 (!) 124/92 (!) 131/100 (!) 132/98  Pulse: 75 75 75 81  Resp: 16 16 20 16   Temp: 98.2 F (36.8 C) 98.2 F (36.8 C) (!) 97.5 F (36.4 C) 97.7 F (36.5 C)  TempSrc: Oral Oral Oral Oral  SpO2: 100% 100% 99% 100%  Weight:      Height:        Intake/Output Summary (Last 24 hours) at 04/23/2020 1603 Last data filed at 04/23/2020 0950 Gross per 24 hour  Intake 240 ml  Output --  Net 240 ml   Filed Weights   03/17/20 0500 03/21/20 0338 04/07/20 0340  Weight: 62 kg 63.1 kg 63 kg    Examination: He looks comfortable.  He refused examination.  He is on room air.  Does not want to talk. Data Reviewed: I have personally reviewed following labs and imaging studies  CBC: No results for input(s): WBC, NEUTROABS, HGB, HCT, MCV, PLT in the last 168 hours. Basic Metabolic Panel: No results for input(s): NA, K, CL, CO2, GLUCOSE, BUN, CREATININE, CALCIUM, MG, PHOS in the last 168 hours. GFR: CrCl cannot be calculated (Patient's most recent lab result is older than the maximum 21 days allowed.). Liver Function Tests: No results for input(s): AST, ALT, ALKPHOS, BILITOT, PROT, ALBUMIN in the last 168 hours. No results for input(s):  LIPASE, AMYLASE in the last 168 hours. No results for input(s): AMMONIA in the last 168 hours. Coagulation Profile: No results for input(s): INR, PROTIME in the last 168 hours. Cardiac Enzymes: No results for input(s): CKTOTAL, CKMB, CKMBINDEX, TROPONINI in the last 168 hours. BNP (last 3 results) No results for input(s): PROBNP in the last 8760 hours. HbA1C: No results for input(s): HGBA1C in the last 72 hours. CBG: Recent Labs  Lab 04/22/20 2140 04/22/20 2343 04/23/20 0350 04/23/20 0805 04/23/20 1215  GLUCAP 78 89 89 99 97   Lipid Profile: No results for input(s): CHOL, HDL, LDLCALC, TRIG, CHOLHDL, LDLDIRECT in the last 72 hours. Thyroid Function Tests: No results for input(s): TSH, T4TOTAL, FREET4, T3FREE, THYROIDAB in the last 72 hours. Anemia Panel: No results for input(s): VITAMINB12, FOLATE, FERRITIN, TIBC, IRON, RETICCTPCT in the last 72 hours. Sepsis Labs: No results for input(s): PROCALCITON, LATICACIDVEN in  the last 168 hours.  No results found for this or any previous visit (from the past 240 hour(s)).    Radiology Studies: No results found.   Scheduled Meds: . amLODipine  10 mg Per Tube Daily  . chlorhexidine  15 mL Mouth Rinse BID  . enoxaparin (LOVENOX) injection  40 mg Subcutaneous Daily  . feeding supplement (JEVITY 1.2 CAL)  474 mL Per Tube 4x daily  . feeding supplement (PRO-STAT SUGAR FREE 64)  30 mL Per Tube QID  . free water  200 mL Per Tube Q4H  . hydrALAZINE  25 mg Per Tube Q6H  . insulin aspart  0-15 Units Subcutaneous Q4H  . levETIRAcetam  500 mg Oral BID  . mouth rinse  15 mL Mouth Rinse q12n4p  . metoprolol tartrate  25 mg Per Tube BID  . multivitamin  15 mL Per Tube Daily  . polyethylene glycol  17 g Per Tube BID  . QUEtiapine  50 mg Per Tube QHS   Continuous Infusions:   LOS: 227 days    Time spent: 25 min    Barb Merino, MD [facility] If 7PM-7AM, please contact night-coverage

## 2020-04-23 NOTE — Progress Notes (Signed)
Refused tube feedings and assessments this shift but did allow vital sign assessments at 2000 and 0000. Also took Metorprolol and Keppra via PEG tube.

## 2020-04-24 DIAGNOSIS — J9601 Acute respiratory failure with hypoxia: Secondary | ICD-10-CM | POA: Diagnosis not present

## 2020-04-24 DIAGNOSIS — I611 Nontraumatic intracerebral hemorrhage in hemisphere, cortical: Secondary | ICD-10-CM | POA: Diagnosis not present

## 2020-04-24 LAB — GLUCOSE, CAPILLARY
Glucose-Capillary: 163 mg/dL — ABNORMAL HIGH (ref 70–99)
Glucose-Capillary: 178 mg/dL — ABNORMAL HIGH (ref 70–99)
Glucose-Capillary: 90 mg/dL (ref 70–99)
Glucose-Capillary: 94 mg/dL (ref 70–99)
Glucose-Capillary: 98 mg/dL (ref 70–99)
Glucose-Capillary: 99 mg/dL (ref 70–99)

## 2020-04-24 NOTE — Progress Notes (Signed)
Per Dr. Sloan Leiter, need to get labs drawn today. Patient still refusing and shaking head no. Dr. Sloan Leiter says restraints may be needed. Ethics committee contacted by nursing staff since patient willing takes some medications from staff. Ethics committee said if psych said patient can not make his own decision we will need to contact patient's mother. Patient's mother, [name], contacted and left message. Eva's husband said she should call back on her lunch break. Awaiting call before labs are drawn. [place]

## 2020-04-24 NOTE — Progress Notes (Signed)
PROGRESS NOTE    Joshua [name]  HYQ:[id number] DOB: [birth year] DOA: 09/09/2019 PCP: Patient, [name]   Brief Narrative: 39 year old African-American male admitted by neurosurgery on 09/09/2019 for seizure, right hemiplegia and decreased responsiveness found to have left hemispheric intracranial hemorrhage, intubated and admitted to ICU. Patient underwent craniotomy for decompression on 09/09/2019 by Dr. Vertell Limber, patient showed slow neurological recovery and prolonged hospitalization due to seizure and aspiration pneumonia.Patient continued to be awake but significant dysphagia, PEG tube was placed on 09/25/2019. Placement has been challenging as patient is uninsured and needs long-term care. CT abdomen pelvis 1/11 was negative for obstruction. On 03/19/2020 the patient's PEG tube sustained damage externally. Nursing has confirmed that it is still functional and can be clamped and flushed without complication.  Has been refusing care blood draws and examination.Palliative care has been consulted to help reconcile the patient's refusal of all interventions or exams with his full code status. Awaiting on placement, has been challenging due to the fact that he is uninsured and may need long-term placement and is refusing to participate in his care.  Subjective: No overnight events.  As usual refuses examinations.  He was able to take most of the medications yesterday. I try to examine him, he resisted. Unable to verbalize.  He just said no to everything.  Assessment & Plan:   Left Intracranial hemorrhage, likely from uncontrolled hypertension status post decompressive craniectomy and hematoma evacuation 09/09/19.CT head no vascular malformation.  Patient remains aphasic and right hemiplegia with cognitive impairment.  Refusing to participate in the care: Has been refusing oral medication and blood draws.  Palliative care was consulted to help reconcile patient's refusal of care, mother wants full code.   Psychiatry had seen the patient.  They deemed patient is unable to make his decisions and not competent to make his decisions.   Patient cannot refuse care.  We will check his labs today, restraints if needed to draw labs.  CKD stage IIIa last creatinine 1.23 back in January patient has been refusing blood work.  We will draw labs today.  Seizure disorder: Continue Keppra, use ativan if seizure.   Hypertension: Blood pressure is well controlled.  On amlodipine .  Blood pressures low normal.  Will discontinue hydralazine.  Klebsiella UTI -was treated with antibiotics previously Acute respiratory failure: Resolved Aspiration pneumonia/chest congestion: Resolved  Nutrition: s/p PEG tube placement, on tube feeding.  DVT Prophylaxis: Lovenox CODE STATUS: Full Code.  Family communication/goals of care:palliative care following.   Status is: Inpatient  Remains inpatient appropriate because:Unsafe d/c plan Dispo: The patient is from: Home              Anticipated d/c is to: SNF              Anticipated d/c date is: > 3 days              Patient currently is medically stable to d/c.  Consultants:   Trauma  PCCM  Neurosurgery  Neurology  Procedures:   Antimicrobials:  Anti-infectives (From admission, onward)   Start     Dose/Rate Route Frequency Ordered Stop   09/27/19 1130  cephALEXin (KEFLEX) 250 MG/5ML suspension 500 mg     500 mg Per Tube Every 12 hours 09/27/19 1052 09/29/19 2250   09/25/19 1445  ceFAZolin (ANCEF) IVPB 2g/100 mL premix  Status:  Discontinued     2 g 200 mL/hr over 30 Minutes Intravenous Every 12 hours 09/25/19 1417 09/27/19 1052   09/14/19 1000  Ampicillin-Sulbactam (UNASYN) 3 g in sodium chloride 0.9 % 100 mL IVPB     3 g 200 mL/hr over 30 Minutes Intravenous Every 8 hours 09/14/19 0922 09/17/19 1816   09/13/19 1100  vancomycin (VANCOCIN) IVPB 750 mg/150 ml premix  Status:  Discontinued     750 mg 150 mL/hr over 60 Minutes Intravenous Every 24 hours  09/13/19 0728 09/13/19 0838   09/12/19 1100  vancomycin (VANCOCIN) IVPB 1000 mg/200 mL premix  Status:  Discontinued     1,000 mg 200 mL/hr over 60 Minutes Intravenous Every 24 hours 09/11/19 1034 09/13/19 0728   09/11/19 1130  piperacillin-tazobactam (ZOSYN) IVPB 3.375 g  Status:  Discontinued     3.375 g 12.5 mL/hr over 240 Minutes Intravenous Every 8 hours 09/11/19 1054 09/14/19 0845   09/11/19 1100  vancomycin (VANCOCIN) 1,500 mg in sodium chloride 0.9 % 500 mL IVPB     1,500 mg 250 mL/hr over 120 Minutes Intravenous  Once 09/11/19 1034 09/11/19 1900   09/11/19 1015  piperacillin-tazobactam (ZOSYN) IVPB 4.5 g  Status:  Discontinued     4.5 g 200 mL/hr over 30 Minutes Intravenous Every 8 hours 09/11/19 1010 09/11/19 1052   09/09/19 0845  ceFAZolin (ANCEF) IVPB 2g/100 mL premix     2 g 200 mL/hr over 30 Minutes Intravenous Every 8 hours 09/09/19 0842 09/09/19 1658     Objective: Vitals:   04/24/20 0000 04/24/20 0400 04/24/20 0448 04/24/20 0822  BP: (!) 135/99  104/83 (!) 116/94  Pulse: (!) 102 89  79  Resp: 18  18 18   Temp: 98.8 F (37.1 C) 97.6 F (36.4 C)  97.7 F (36.5 C)  TempSrc: Oral Axillary  Oral  SpO2: 99% 98%  100%  Weight:      Height:        Intake/Output Summary (Last 24 hours) at 04/24/2020 1102 Last data filed at 04/24/2020 0900 Gross per 24 hour  Intake 600 ml  Output --  Net 600 ml   Filed Weights   03/17/20 0500 03/21/20 0338 04/07/20 0340  Weight: 62 kg 63.1 kg 63 kg    Examination: Looks comfortable.  No obvious abnormal exam. Pulling on the sheets with both hands.  He is moving all extremities. Data Reviewed: I have personally reviewed following labs and imaging studies  CBC: No results for input(s): WBC, NEUTROABS, HGB, HCT, MCV, PLT in the last 168 hours. Basic Metabolic Panel: No results for input(s): NA, K, CL, CO2, GLUCOSE, BUN, CREATININE, CALCIUM, MG, PHOS in the last 168 hours. GFR: CrCl cannot be calculated (Patient's most recent  lab result is older than the maximum 21 days allowed.). Liver Function Tests: No results for input(s): AST, ALT, ALKPHOS, BILITOT, PROT, ALBUMIN in the last 168 hours. No results for input(s): LIPASE, AMYLASE in the last 168 hours. No results for input(s): AMMONIA in the last 168 hours. Coagulation Profile: No results for input(s): INR, PROTIME in the last 168 hours. Cardiac Enzymes: No results for input(s): CKTOTAL, CKMB, CKMBINDEX, TROPONINI in the last 168 hours. BNP (last 3 results) No results for input(s): PROBNP in the last 8760 hours. HbA1C: No results for input(s): HGBA1C in the last 72 hours. CBG: Recent Labs  Lab 04/23/20 1620 04/23/20 2003 04/24/20 0051 04/24/20 0433 04/24/20 0820  GLUCAP 86 85 163* 99 94   Lipid Profile: No results for input(s): CHOL, HDL, LDLCALC, TRIG, CHOLHDL, LDLDIRECT in the last 72 hours. Thyroid Function Tests: No results for input(s): TSH, T4TOTAL, FREET4, T3FREE, THYROIDAB in  the last 72 hours. Anemia Panel: No results for input(s): VITAMINB12, FOLATE, FERRITIN, TIBC, IRON, RETICCTPCT in the last 72 hours. Sepsis Labs: No results for input(s): PROCALCITON, LATICACIDVEN in the last 168 hours.  No results found for this or any previous visit (from the past 240 hour(s)).    Radiology Studies: No results found.   Scheduled Meds: . amLODipine  10 mg Per Tube Daily  . chlorhexidine  15 mL Mouth Rinse BID  . enoxaparin (LOVENOX) injection  40 mg Subcutaneous Daily  . feeding supplement (JEVITY 1.2 CAL)  474 mL Per Tube 4x daily  . feeding supplement (PRO-STAT SUGAR FREE 64)  30 mL Per Tube QID  . free water  200 mL Per Tube Q4H  . hydrALAZINE  25 mg Per Tube Q6H  . insulin aspart  0-15 Units Subcutaneous Q4H  . levETIRAcetam  500 mg Oral BID  . mouth rinse  15 mL Mouth Rinse q12n4p  . metoprolol tartrate  25 mg Per Tube BID  . multivitamin  15 mL Per Tube Daily  . polyethylene glycol  17 g Per Tube BID  . QUEtiapine  50 mg Per Tube  QHS   Continuous Infusions:   LOS: 228 days    Time spent: 25 min    Barb Merino, MD [facility] If 7PM-7AM, please contact night-coverage

## 2020-04-24 NOTE — NC FL2 (Signed)
[place] LEVEL OF CARE SCREENING TOOL     IDENTIFICATION  [name] Birthdate: [birth year] Sex: male Admission Date (Current Location): 09/09/2019  [facility] and [place] Number:  Herbalist and Address:  [facility]. [facility], [place] [address] [place],  [address]      Provider Number: [id number]  Attending Physician Name and Address:  Barb Merino, MD  Relative Name and Phone Number:       Current Level of Care: Hospital Recommended Level of Care: [place] Prior Approval Number:    Date Approved/Denied:   PASRR Number: [id number]  Discharge Plan: SNF    Current Diagnoses: Patient Active Problem List   Diagnosis Date Noted  . PEG (percutaneous endoscopic gastrostomy) status ([place])   . Palliative care by specialist   . Goals of care, counseling/discussion   . AKI (acute kidney injury) ([place])   . Essential hypertension 10/07/2019  . Severe protein-calorie malnutrition ([place]) 10/07/2019  . CKD (chronic kidney disease), stage IV ([place]) 10/07/2019  . Seizure ([place]) 10/07/2019  . Acute respiratory failure ([place])   . Hemorrhagic stroke ([place])   . Cytotoxic brain edema ([place]) 09/10/2019  . ICH (intracerebral hemorrhage) ([place]) 09/09/2019  . Subdural hematoma ([place]) 09/09/2019  . Intracerebral hemorrhage ([place]) 09/09/2019    Orientation RESPIRATION BLADDER Height & Weight     Self(nods approriately to questions)  Normal Incontinent Weight: 63 kg Height:  5\' 9"  (175.3 cm)  BEHAVIORAL SYMPTOMS/MOOD NEUROLOGICAL BOWEL NUTRITION STATUS    Convulsions/Seizures(keppra 500 mg BID) Incontinent Feeding tube, Diet(Jevity bolus feeds 4x daily// also on Regular diet with thin liquids)  AMBULATORY STATUS COMMUNICATION OF NEEDS Skin   Total Care Verbally Normal                       Personal Care Assistance Level of Assistance  Bathing, Feeding, Dressing Bathing Assistance: Maximum assistance Feeding  assistance: Maximum assistance Dressing Assistance: Maximum assistance     Functional Limitations Info  Speech     Speech Info: Impaired    SPECIAL CARE FACTORS FREQUENCY  PT (By licensed PT), OT (By licensed OT), Speech therapy     PT Frequency: 5x/wk OT Frequency: 5x/wk     Speech Therapy Frequency: 5x/wk      Contractures Contractures Info: Not present    Additional Factors Info  Code Status, Allergies, Insulin Sliding Scale, Psychotropic Code Status Info: Full Allergies Info: NKA Psychotropic Info: Seroquel 50 mg at bedtime Insulin Sliding Scale Info: Novolog 0-15 units SQ every 4 hours       Current Medications (04/24/2020):  This is the current hospital active medication list Current Facility-Administered Medications  Medication Dose Route Frequency Provider Last Rate Last Admin  . acetaminophen (TYLENOL) tablet 650 mg  650 mg Per Tube Q4H PRN Donzetta Starch, NP   650 mg at 03/27/20 2105   Or  . acetaminophen (TYLENOL) suppository 650 mg  650 mg Rectal Q4H PRN Donzetta Starch, NP      . amLODipine (NORVASC) tablet 10 mg  10 mg Per Tube Daily Donzetta Starch, NP   10 mg at 04/24/20 1127  . bisacodyl (DULCOLAX) suppository 10 mg  10 mg Rectal Daily PRN Donzetta Starch, NP   10 mg at 11/25/19 1152  . chlorhexidine (PERIDEX) 0.12 % solution 15 mL  15 mL Mouth Rinse BID Marylyn Ishihara, Tyrone A, DO   15 mL at 04/21/20 2155  . enoxaparin (LOVENOX) injection  40 mg  40 mg Subcutaneous Daily Danford, Suann Larry, MD   40 mg at 04/18/20 1047  . feeding supplement (JEVITY 1.2 CAL) liquid 474 mL  474 mL Per Tube 4x daily Georgette Shell, MD 0 mL/hr at 04/19/20 2353 474 mL at 04/24/20 1128  . feeding supplement (PRO-STAT SUGAR FREE 64) liquid 30 mL  30 mL Per Tube QID Lucky Cowboy, MD   30 mL at 04/24/20 1127  . free water 200 mL  200 mL Per Tube Q4H Swayze, Ava, DO   200 mL at 04/24/20 1136  . glycopyrrolate (ROBINUL) injection 0.1 mg  0.1 mg Intravenous TID PRN Edwin Dada, MD   0.1 mg at 10/10/19 2226  . insulin aspart (novoLOG) injection 0-15 Units  0-15 Units Subcutaneous Q4H Donzetta Starch, NP   3 Units at 04/24/20 0055  . labetalol (NORMODYNE) injection 5 mg  5 mg Intravenous Q2H PRN Lavina Hamman, MD      . levETIRAcetam (KEPPRA) tablet 500 mg  500 mg Oral BID Lavina Hamman, MD   500 mg at 04/24/20 1135  . lip balm (CARMEX) ointment   Topical PRN Irene Pap N, DO      . LORazepam (ATIVAN) injection 0.5 mg  0.5 mg Intravenous BID PRN Regalado, Belkys A, MD      . MEDLINE mouth rinse  15 mL Mouth Rinse q12n4p Kyle, Tyrone A, DO   15 mL at 04/18/20 1700  . metoprolol tartrate (LOPRESSOR) tablet 25 mg  25 mg Per Tube BID Lucky Cowboy, MD   25 mg at 04/24/20 1127  . multivitamin liquid 15 mL  15 mL Per Tube Daily Burnetta Sabin L, NP   15 mL at 04/24/20 1128  . ondansetron (ZOFRAN) injection 4 mg  4 mg Intravenous Q4H PRN Donzetta Starch, NP   4 mg at 09/28/19 0750  . polyethylene glycol (MIRALAX / GLYCOLAX) packet 17 g  17 g Per Tube BID Swayze, Ava, DO   17 g at 04/23/20 2353  . promethazine (PHENERGAN) injection 12.5 mg  12.5 mg Intravenous Q6H PRN Lang Snow, FNP      . QUEtiapine (SEROQUEL) tablet 50 mg  50 mg Per Tube QHS Edwin Dada, MD   50 mg at 04/23/20 2350     Discharge Medications: Please see discharge summary for a list of discharge medications.  Relevant Imaging Results:  Relevant Lab Results:   Additional Information SS#: [id number]  Pollie Friar, RN

## 2020-04-24 NOTE — Progress Notes (Signed)
This nurse called mother, [name], x2. No answer. Will try again if time allows. [place]

## 2020-04-24 NOTE — Progress Notes (Signed)
Mother, Joshua [name], called back and wants everything done for her son. She attempted to call patient's brother to discuss with him but no response from the brother. Mrs. [name] agrees for staff to hold patient down to drawn labs. Nurse told mother that staff was worried about patient trying to hit staff with left side. Mother says to attempt to hold him down but if he starts to get aggressive back away. Nurse will call lab. [place]

## 2020-04-25 DIAGNOSIS — J9601 Acute respiratory failure with hypoxia: Secondary | ICD-10-CM | POA: Diagnosis not present

## 2020-04-25 DIAGNOSIS — I611 Nontraumatic intracerebral hemorrhage in hemisphere, cortical: Secondary | ICD-10-CM | POA: Diagnosis not present

## 2020-04-25 LAB — CBC WITH DIFFERENTIAL/PLATELET
Abs Immature Granulocytes: 0.02 10*3/uL (ref 0.00–0.07)
Basophils Absolute: 0 10*3/uL (ref 0.0–0.1)
Basophils Relative: 1 %
Eosinophils Absolute: 0.3 10*3/uL (ref 0.0–0.5)
Eosinophils Relative: 5 %
HCT: 44.5 % (ref 39.0–52.0)
Hemoglobin: 13.6 g/dL (ref 13.0–17.0)
Immature Granulocytes: 0 %
Lymphocytes Relative: 40 %
Lymphs Abs: 2.9 10*3/uL (ref 0.7–4.0)
MCH: 25.5 pg — ABNORMAL LOW (ref 26.0–34.0)
MCHC: 30.6 g/dL (ref 30.0–36.0)
MCV: 83.3 fL (ref 80.0–100.0)
Monocytes Absolute: 0.6 10*3/uL (ref 0.1–1.0)
Monocytes Relative: 8 %
Neutro Abs: 3.4 10*3/uL (ref 1.7–7.7)
Neutrophils Relative %: 46 %
Platelets: 274 10*3/uL (ref 150–400)
RBC: 5.34 MIL/uL (ref 4.22–5.81)
RDW: 15.8 % — ABNORMAL HIGH (ref 11.5–15.5)
WBC: 7.4 10*3/uL (ref 4.0–10.5)
nRBC: 0 % (ref 0.0–0.2)

## 2020-04-25 LAB — GLUCOSE, CAPILLARY
Glucose-Capillary: 94 mg/dL (ref 70–99)
Glucose-Capillary: 113 mg/dL — ABNORMAL HIGH (ref 70–99)
Glucose-Capillary: 96 mg/dL (ref 70–99)
Glucose-Capillary: 82 mg/dL (ref 70–99)
Glucose-Capillary: 104 mg/dL — ABNORMAL HIGH (ref 70–99)
Glucose-Capillary: 131 mg/dL — ABNORMAL HIGH (ref 70–99)

## 2020-04-25 LAB — COMPREHENSIVE METABOLIC PANEL
ALT: 13 U/L (ref 0–44)
AST: 17 U/L (ref 15–41)
Albumin: 3.5 g/dL (ref 3.5–5.0)
Alkaline Phosphatase: 115 U/L (ref 38–126)
Anion gap: 12 (ref 5–15)
BUN: 37 mg/dL — ABNORMAL HIGH (ref 6–20)
CO2: 29 mmol/L (ref 22–32)
Calcium: 10.2 mg/dL (ref 8.9–10.3)
Chloride: 100 mmol/L (ref 98–111)
Creatinine, Ser: 1.7 mg/dL — ABNORMAL HIGH (ref 0.61–1.24)
GFR calc Af Amer: 57 mL/min — ABNORMAL LOW (ref >60)
GFR calc non Af Amer: 49 mL/min — ABNORMAL LOW (ref >60)
Glucose, Bld: 107 mg/dL — ABNORMAL HIGH (ref 70–99)
Potassium: 4.1 mmol/L (ref 3.5–5.1)
Sodium: 141 mmol/L (ref 135–145)
Total Bilirubin: 0.4 mg/dL (ref 0.3–1.2)
Total Protein: 7.7 g/dL (ref 6.5–8.1)

## 2020-04-25 LAB — MAGNESIUM: Magnesium: 1.9 mg/dL (ref 1.7–2.4)

## 2020-04-25 LAB — PHOSPHORUS: Phosphorus: 4.4 mg/dL (ref 2.5–4.6)

## 2020-04-25 NOTE — Progress Notes (Signed)
PROGRESS NOTE    Joshua [name]  TIR:[id number] DOB: 09-11-1981 DOA: 09/09/2019 PCP: Patient, [name]   Brief Narrative: 39 year old African-American male admitted by neurosurgery on 09/09/2019 for seizure, right hemiplegia and decreased responsiveness found to have left hemispheric intracranial hemorrhage, intubated and admitted to ICU. Patient underwent craniotomy for decompression on 09/09/2019 by Dr. Vertell Limber, patient showed slow neurological recovery and prolonged hospitalization due to seizure and aspiration pneumonia.Patient continued to be awake but significant dysphagia, PEG tube was placed on 09/25/2019. Placement has been challenging as patient is uninsured and needs long-term care. CT abdomen pelvis 1/11 was negative for obstruction. On 03/19/2020 the patient's PEG tube sustained damage externally. Nursing has confirmed that it is still functional and can be clamped and flushed without complication.  Has been refusing care blood draws and examination.Palliative care has been consulted to help reconcile the patient's refusal of all interventions or exams with his full code status. Awaiting on placement.   Subjective: No overnight events.  No change in condition. Nursing staff were able to convince him to have blood drawn today after 3 months. As usual, refuses to be examined and nods his head no.   Assessment & Plan:   Left Intracranial hemorrhage, likely from uncontrolled hypertension status post decompressive craniectomy and hematoma evacuation 09/09/19.CT head no vascular malformation.  Patient remains aphasic and right hemiplegia with cognitive impairment.  Refusing to participate in the care: Has been refusing oral medication and blood draws.  Palliative care was consulted to help reconcile patient's refusal of care, mother wants full code.  Psychiatry had seen the patient.  They deemed patient is unable to make his decisions and not competent to make his decisions.  He will need  long-term care.  CKD stage IIIa Creatinine 1.7.  Fairly stable.  Seizure disorder: Continue Keppra, use ativan if seizure.   Hypertension: Blood pressure is well controlled.  On amlodipine .  Hydralazine discontinued.  Klebsiella UTI -was treated with antibiotics previously  Acute respiratory failure: Resolved  Aspiration pneumonia/chest congestion: Resolved  Nutrition: s/p PEG tube placement, on tube feeding.  Tolerating well.  DVT Prophylaxis: Lovenox CODE STATUS: Full Code.  Family communication/goals of care:palliative care following.   Status is: Inpatient  Remains inpatient appropriate because:Unsafe d/c plan Dispo: The patient is from: Home              Anticipated d/c is to: SNF              Anticipated d/c date is: > 3 days              Patient currently is medically stable to d/c.  Consultants:   Trauma  PCCM  Neurosurgery  Neurology  Procedures:   Antimicrobials:  Anti-infectives (From admission, onward)   Start     Dose/Rate Route Frequency Ordered Stop   09/27/19 1130  cephALEXin (KEFLEX) 250 MG/5ML suspension 500 mg     500 mg Per Tube Every 12 hours 09/27/19 1052 09/29/19 2250   09/25/19 1445  ceFAZolin (ANCEF) IVPB 2g/100 mL premix  Status:  Discontinued     2 g 200 mL/hr over 30 Minutes Intravenous Every 12 hours 09/25/19 1417 09/27/19 1052   09/14/19 1000  Ampicillin-Sulbactam (UNASYN) 3 g in sodium chloride 0.9 % 100 mL IVPB     3 g 200 mL/hr over 30 Minutes Intravenous Every 8 hours 09/14/19 0922 09/17/19 1816   09/13/19 1100  vancomycin (VANCOCIN) IVPB 750 mg/150 ml premix  Status:  Discontinued  750 mg 150 mL/hr over 60 Minutes Intravenous Every 24 hours 09/13/19 0728 09/13/19 0838   09/12/19 1100  vancomycin (VANCOCIN) IVPB 1000 mg/200 mL premix  Status:  Discontinued     1,000 mg 200 mL/hr over 60 Minutes Intravenous Every 24 hours 09/11/19 1034 09/13/19 0728   09/11/19 1130  piperacillin-tazobactam (ZOSYN) IVPB 3.375 g  Status:   Discontinued     3.375 g 12.5 mL/hr over 240 Minutes Intravenous Every 8 hours 09/11/19 1054 09/14/19 0845   09/11/19 1100  vancomycin (VANCOCIN) 1,500 mg in sodium chloride 0.9 % 500 mL IVPB     1,500 mg 250 mL/hr over 120 Minutes Intravenous  Once 09/11/19 1034 09/11/19 1900   09/11/19 1015  piperacillin-tazobactam (ZOSYN) IVPB 4.5 g  Status:  Discontinued     4.5 g 200 mL/hr over 30 Minutes Intravenous Every 8 hours 09/11/19 1010 09/11/19 1052   09/09/19 0845  ceFAZolin (ANCEF) IVPB 2g/100 mL premix     2 g 200 mL/hr over 30 Minutes Intravenous Every 8 hours 09/09/19 0842 09/09/19 1658     Objective: Vitals:   04/24/20 2000 04/24/20 2353 04/25/20 0354 04/25/20 0736  BP: 119/81 112/76 120/80 (!) 126/99  Pulse: 91 93 90 81  Resp: 18 18 18 18   Temp: 98 F (36.7 C) 98.3 F (36.8 C) 98.1 F (36.7 C) 97.8 F (36.6 C)  TempSrc: Oral Oral Oral Oral  SpO2: 99% 98% 98% 100%  Weight:   65 kg   Height:       No intake or output data in the 24 hours ending 04/25/20 1049 Filed Weights   03/21/20 0338 04/07/20 0340 04/25/20 0354  Weight: 63.1 kg 63 kg 65 kg    Examination: Looks comfortable.  No obvious abnormal exam. Pulling on the sheets with left hand.  Right hand unable to move. Resisting care. PEG tube in place intact and dry.  Data Reviewed: I have personally reviewed following labs and imaging studies  CBC: Recent Labs  Lab 04/25/20 0733  WBC 7.4  NEUTROABS 3.4  HGB 13.6  HCT 44.5  MCV 83.3  PLT 315   Basic Metabolic Panel: Recent Labs  Lab 04/25/20 0733  NA 141  K 4.1  CL 100  CO2 29  GLUCOSE 107*  BUN 37*  CREATININE 1.70*  CALCIUM 10.2  MG 1.9  PHOS 4.4   GFR: Estimated Creatinine Clearance: 53.1 mL/min (A) (by C-G formula based on SCr of 1.7 mg/dL (H)). Liver Function Tests: Recent Labs  Lab 04/25/20 0733  AST 17  ALT 13  ALKPHOS 115  BILITOT 0.4  PROT 7.7  ALBUMIN 3.5   No results for input(s): LIPASE, AMYLASE in the last 168  hours. No results for input(s): AMMONIA in the last 168 hours. Coagulation Profile: No results for input(s): INR, PROTIME in the last 168 hours. Cardiac Enzymes: No results for input(s): CKTOTAL, CKMB, CKMBINDEX, TROPONINI in the last 168 hours. BNP (last 3 results) No results for input(s): PROBNP in the last 8760 hours. HbA1C: No results for input(s): HGBA1C in the last 72 hours. CBG: Recent Labs  Lab 04/24/20 0820 04/24/20 1121 04/24/20 1543 04/24/20 1959 04/25/20 0732  GLUCAP 94 90 98 178* 94   Lipid Profile: No results for input(s): CHOL, HDL, LDLCALC, TRIG, CHOLHDL, LDLDIRECT in the last 72 hours. Thyroid Function Tests: No results for input(s): TSH, T4TOTAL, FREET4, T3FREE, THYROIDAB in the last 72 hours. Anemia Panel: No results for input(s): VITAMINB12, FOLATE, FERRITIN, TIBC, IRON, RETICCTPCT in the last  72 hours. Sepsis Labs: No results for input(s): PROCALCITON, LATICACIDVEN in the last 168 hours.  No results found for this or any previous visit (from the past 240 hour(s)).    Radiology Studies: No results found.   Scheduled Meds: . amLODipine  10 mg Per Tube Daily  . chlorhexidine  15 mL Mouth Rinse BID  . enoxaparin (LOVENOX) injection  40 mg Subcutaneous Daily  . feeding supplement (JEVITY 1.2 CAL)  474 mL Per Tube 4x daily  . feeding supplement (PRO-STAT SUGAR FREE 64)  30 mL Per Tube QID  . free water  200 mL Per Tube Q4H  . insulin aspart  0-15 Units Subcutaneous Q4H  . levETIRAcetam  500 mg Oral BID  . mouth rinse  15 mL Mouth Rinse q12n4p  . metoprolol tartrate  25 mg Per Tube BID  . multivitamin  15 mL Per Tube Daily  . polyethylene glycol  17 g Per Tube BID  . QUEtiapine  50 mg Per Tube QHS   Continuous Infusions:   LOS: 229 days    Time spent: 25 min    Barb Merino, MD [facility] If 7PM-7AM, please contact night-coverage

## 2020-04-25 NOTE — Progress Notes (Signed)
1030AM  Patient was very cooperative this morning with oral medication, blood draws, feedings, VS and CBG check.  1340  Patient refused afternoon CBG check and feedings. MD made aware.

## 2020-04-25 NOTE — TOC Progression Note (Signed)
Transition of Care [facility]) - Progression Note    Patient Details  Name: Joshua [name] MRN: [id number] Date of Birth: Jul 14, 1981  Transition of [facility]) CM/SW Contact  Pollie Friar, RN Phone Number: 04/25/2020, 10:39 AM  Clinical Narrative:    Received information from financial counseling that patient has been approved for Medicaid disability but needs his mother to call to be set up as the payor recipient until he is set up at a SNF. CM updated the mother by voicemail yesterday and left a message again today to see if she called and had this arranged.  CM has sent his information to [employer] per his mothers request on Monday. She asked that once we had the disability if we could fax him to [facility] to see if they would accept.  TOC following.   Expected Discharge Plan: Skilled Nursing Facility Barriers to Discharge: SNF Pending payor source - LOG, SNF Pending bed offer, Inadequate or no insurance  Expected Discharge Plan and Services Expected Discharge Plan: [place] In-house Referral: Clinical Social Work Discharge Planning Services: NA Post Acute Care Choice: [place] Living arrangements for the past 2 months: Apartment                 DME Arranged: N/A DME Agency: NA       HH Arranged: NA HH Agency: NA         Social Determinants of Health (SDOH) Interventions    Readmission Risk Interventions No flowsheet data found.

## 2020-04-26 LAB — GLUCOSE, CAPILLARY
Glucose-Capillary: 93 mg/dL (ref 70–99)
Glucose-Capillary: 96 mg/dL (ref 70–99)
Glucose-Capillary: 143 mg/dL — ABNORMAL HIGH (ref 70–99)
Glucose-Capillary: 83 mg/dL (ref 70–99)
Glucose-Capillary: 93 mg/dL (ref 70–99)

## 2020-04-26 NOTE — Progress Notes (Signed)
PROGRESS NOTE    Blandon Offerdahl  XVQ:[id number] DOB: 1981/06/16 DOA: 09/09/2019 PCP: Patient, [name]   Brief Narrative: 39 year old African-American male admitted by neurosurgery on 09/09/2019 for seizure, right hemiplegia and decreased responsiveness found to have left hemispheric intracranial hemorrhage, intubated and admitted to ICU. Patient underwent craniotomy for decompression on 09/09/2019 by Dr. Vertell Limber, patient showed slow neurological recovery and prolonged hospitalization due to seizure and aspiration pneumonia.Patient continued to be awake but significant dysphagia, PEG tube was placed on 09/25/2019. Placement has been challenging as patient is uninsured and needs long-term care. CT abdomen pelvis 1/11 was negative for obstruction. On 03/19/2020 the patient's PEG tube sustained damage externally. Nursing has confirmed that it is still functional and can be clamped and flushed without complication.  Has been refusing care blood draws and examination.Palliative care has been consulted to help reconcile the patient's refusal of all interventions or exams with his full code status. Awaiting on placement.   Subjective: No overnight events.  No change in condition. Nursing staff were able to convince him to have blood drawn after 3 months. As usual, refuses to be examined and nods his head no.   Assessment & Plan:   Left Intracranial hemorrhage, likely from uncontrolled hypertension status post decompressive craniectomy and hematoma evacuation 09/09/19.CT head no vascular malformation.  Patient remains aphasic and right hemiplegia with cognitive impairment.  Refusing to participate in the care: Has been refusing oral medication and blood draws.  Palliative care was consulted to help reconcile patient's refusal of care, mother wants full code.  Psychiatry had seen the patient.  They deemed patient is unable to make his decisions and not competent to make his decisions.  He will need  long-term care.  CKD stage IIIa Creatinine 1.7.  Fairly stable.  Seizure disorder: Continue Keppra, use ativan if seizure.   Hypertension: Blood pressure is well controlled.  On amlodipine .  Hydralazine discontinued.  Klebsiella UTI -was treated with antibiotics previously  Acute respiratory failure: Resolved  Aspiration pneumonia/chest congestion: Resolved  Nutrition: s/p PEG tube placement, on tube feeding.  Tolerating well.  DVT Prophylaxis: Lovenox CODE STATUS: Full Code.  Family communication/goals of care:palliative care following.   Status is: Inpatient  Remains inpatient appropriate because:Unsafe d/c plan Dispo: The patient is from: Home              Anticipated d/c is to: SNF              Anticipated d/c date is: > 3 days              Patient currently is medically stable to d/c.  Consultants:   Trauma  PCCM  Neurosurgery  Neurology  Procedures:   Antimicrobials:  Anti-infectives (From admission, onward)   Start     Dose/Rate Route Frequency Ordered Stop   09/27/19 1130  cephALEXin (KEFLEX) 250 MG/5ML suspension 500 mg     500 mg Per Tube Every 12 hours 09/27/19 1052 09/29/19 2250   09/25/19 1445  ceFAZolin (ANCEF) IVPB 2g/100 mL premix  Status:  Discontinued     2 g 200 mL/hr over 30 Minutes Intravenous Every 12 hours 09/25/19 1417 09/27/19 1052   09/14/19 1000  Ampicillin-Sulbactam (UNASYN) 3 g in sodium chloride 0.9 % 100 mL IVPB     3 g 200 mL/hr over 30 Minutes Intravenous Every 8 hours 09/14/19 0922 09/17/19 1816   09/13/19 1100  vancomycin (VANCOCIN) IVPB 750 mg/150 ml premix  Status:  Discontinued  750 mg 150 mL/hr over 60 Minutes Intravenous Every 24 hours 09/13/19 0728 09/13/19 0838   09/12/19 1100  vancomycin (VANCOCIN) IVPB 1000 mg/200 mL premix  Status:  Discontinued     1,000 mg 200 mL/hr over 60 Minutes Intravenous Every 24 hours 09/11/19 1034 09/13/19 0728   09/11/19 1130  piperacillin-tazobactam (ZOSYN) IVPB 3.375 g  Status:   Discontinued     3.375 g 12.5 mL/hr over 240 Minutes Intravenous Every 8 hours 09/11/19 1054 09/14/19 0845   09/11/19 1100  vancomycin (VANCOCIN) 1,500 mg in sodium chloride 0.9 % 500 mL IVPB     1,500 mg 250 mL/hr over 120 Minutes Intravenous  Once 09/11/19 1034 09/11/19 1900   09/11/19 1015  piperacillin-tazobactam (ZOSYN) IVPB 4.5 g  Status:  Discontinued     4.5 g 200 mL/hr over 30 Minutes Intravenous Every 8 hours 09/11/19 1010 09/11/19 1052   09/09/19 0845  ceFAZolin (ANCEF) IVPB 2g/100 mL premix     2 g 200 mL/hr over 30 Minutes Intravenous Every 8 hours 09/09/19 0842 09/09/19 1658     Objective: Vitals:   04/25/20 2003 04/26/20 0024 04/26/20 0410 04/26/20 0835  BP: (!) 136/103 (!) 120/97 (!) 125/101 (!) (P) 136/106  Pulse: 88 81 80 (P) 69  Resp: 16 18 18  (P) 18  Temp: 97.9 F (36.6 C) 97.9 F (36.6 C) 97.8 F (36.6 C) (P) 98 F (36.7 C)  TempSrc: Oral Oral Oral (P) Oral  SpO2: 99% 98% 99% (P) 99%  Weight:      Height:        Intake/Output Summary (Last 24 hours) at 04/26/2020 1046 Last data filed at 04/26/2020 0900 Gross per 24 hour  Intake 120 ml  Output --  Net 120 ml   Filed Weights   03/21/20 0338 04/07/20 0340 04/25/20 0354  Weight: 63.1 kg 63 kg 65 kg    Examination: Looks comfortable.  No obvious abnormal exam. Pulling on the sheets with left hand.  Right hand unable to move. Resisting care. PEG tube in place intact and dry.  Data Reviewed: I have personally reviewed following labs and imaging studies  CBC: Recent Labs  Lab 04/25/20 0733  WBC 7.4  NEUTROABS 3.4  HGB 13.6  HCT 44.5  MCV 83.3  PLT 086   Basic Metabolic Panel: Recent Labs  Lab 04/25/20 0733  NA 141  K 4.1  CL 100  CO2 29  GLUCOSE 107*  BUN 37*  CREATININE 1.70*  CALCIUM 10.2  MG 1.9  PHOS 4.4   GFR: Estimated Creatinine Clearance: 53.1 mL/min (A) (by C-G formula based on SCr of 1.7 mg/dL (H)). Liver Function Tests: Recent Labs  Lab 04/25/20 0733  AST 17   ALT 13  ALKPHOS 115  BILITOT 0.4  PROT 7.7  ALBUMIN 3.5   No results for input(s): LIPASE, AMYLASE in the last 168 hours. No results for input(s): AMMONIA in the last 168 hours. Coagulation Profile: No results for input(s): INR, PROTIME in the last 168 hours. Cardiac Enzymes: No results for input(s): CKTOTAL, CKMB, CKMBINDEX, TROPONINI in the last 168 hours. BNP (last 3 results) No results for input(s): PROBNP in the last 8760 hours. HbA1C: No results for input(s): HGBA1C in the last 72 hours. CBG: Recent Labs  Lab 04/25/20 0353 04/25/20 0732 04/25/20 1639 04/25/20 2000 04/25/20 2257  GLUCAP 96 94 82 104* 131*   Lipid Profile: No results for input(s): CHOL, HDL, LDLCALC, TRIG, CHOLHDL, LDLDIRECT in the last 72 hours. Thyroid Function Tests: No  results for input(s): TSH, T4TOTAL, FREET4, T3FREE, THYROIDAB in the last 72 hours. Anemia Panel: No results for input(s): VITAMINB12, FOLATE, FERRITIN, TIBC, IRON, RETICCTPCT in the last 72 hours. Sepsis Labs: No results for input(s): PROCALCITON, LATICACIDVEN in the last 168 hours.  No results found for this or any previous visit (from the past 240 hour(s)).    Radiology Studies: No results found.   Scheduled Meds: . amLODipine  10 mg Per Tube Daily  . chlorhexidine  15 mL Mouth Rinse BID  . enoxaparin (LOVENOX) injection  40 mg Subcutaneous Daily  . feeding supplement (JEVITY 1.2 CAL)  474 mL Per Tube 4x daily  . feeding supplement (PRO-STAT SUGAR FREE 64)  30 mL Per Tube QID  . free water  200 mL Per Tube Q4H  . insulin aspart  0-15 Units Subcutaneous Q4H  . levETIRAcetam  500 mg Oral BID  . mouth rinse  15 mL Mouth Rinse q12n4p  . metoprolol tartrate  25 mg Per Tube BID  . multivitamin  15 mL Per Tube Daily  . polyethylene glycol  17 g Per Tube BID  . QUEtiapine  50 mg Per Tube QHS   Continuous Infusions:   LOS: 230 days    Time spent: 25 min    Barb Merino, MD [facility] If 7PM-7AM, please  contact night-coverage

## 2020-04-27 LAB — GLUCOSE, CAPILLARY: Glucose-Capillary: 138 mg/dL — ABNORMAL HIGH (ref 70–99)

## 2020-04-27 NOTE — Progress Notes (Signed)
PROGRESS NOTE    Joshua [name]  NKN:[id number] DOB: September 16, 1981 DOA: 09/09/2019 PCP: Patient, [name]   Brief Narrative: 39 year old African-American male admitted by neurosurgery on 09/09/2019 for seizure, right hemiplegia and decreased responsiveness found to have left hemispheric intracranial hemorrhage, intubated and admitted to ICU. Patient underwent craniotomy for decompression on 09/09/2019 by Dr. Vertell Limber, patient showed slow neurological recovery and prolonged hospitalization due to seizure and aspiration pneumonia.Patient continued to be awake but significant dysphagia, PEG tube was placed on 09/25/2019. Placement has been challenging as patient is uninsured and needs long-term care. CT abdomen pelvis 1/11 was negative for obstruction. On 03/19/2020 the patient's PEG tube sustained damage externally. Nursing has confirmed that it is still functional and can be clamped and flushed without complication.  Has been refusing care blood draws and examination.Palliative care has been consulted to help reconcile the patient's refusal of all interventions or exams with his full code status. Awaiting on placement.   Subjective: Seen and examined.  No overnight events.   Assessment & Plan:   Left Intracranial hemorrhage, likely from uncontrolled hypertension status post decompressive craniectomy and hematoma evacuation 09/09/19.CT head no vascular malformation.  Patient remains aphasic and right hemiplegia with cognitive impairment.  Refusing to participate in the care: Has been refusing oral medication and blood draws.  Palliative care was consulted to help reconcile patient's refusal of care, mother wants full code.  Psychiatry had seen the patient.  They deemed patient is unable to make his decisions and not competent to make his decisions.  He will need long-term care.  CKD stage IIIa Creatinine 1.7.  Fairly stable.  Seizure disorder: Continue Keppra, use ativan if seizure.   Hypertension:  Blood pressure is well controlled.  On amlodipine .  Hydralazine discontinued.  Klebsiella UTI -was treated with antibiotics previously  Acute respiratory failure: Resolved  Aspiration pneumonia/chest congestion: Resolved  Nutrition: s/p PEG tube placement, on tube feeding.  Tolerating well.  DVT Prophylaxis: Lovenox CODE STATUS: Full Code.  Family communication/goals of care:palliative care following.   Status is: Inpatient  Remains inpatient appropriate because:Unsafe d/c plan Dispo: The patient is from: Home              Anticipated d/c is to: SNF              Anticipated d/c date is: > 3 days              Patient currently is medically stable to d/c.  Consultants:   Trauma  PCCM  Neurosurgery  Neurology  Procedures:   Antimicrobials:  Anti-infectives (From admission, onward)   Start     Dose/Rate Route Frequency Ordered Stop   09/27/19 1130  cephALEXin (KEFLEX) 250 MG/5ML suspension 500 mg     500 mg Per Tube Every 12 hours 09/27/19 1052 09/29/19 2250   09/25/19 1445  ceFAZolin (ANCEF) IVPB 2g/100 mL premix  Status:  Discontinued     2 g 200 mL/hr over 30 Minutes Intravenous Every 12 hours 09/25/19 1417 09/27/19 1052   09/14/19 1000  Ampicillin-Sulbactam (UNASYN) 3 g in sodium chloride 0.9 % 100 mL IVPB     3 g 200 mL/hr over 30 Minutes Intravenous Every 8 hours 09/14/19 0922 09/17/19 1816   09/13/19 1100  vancomycin (VANCOCIN) IVPB 750 mg/150 ml premix  Status:  Discontinued     750 mg 150 mL/hr over 60 Minutes Intravenous Every 24 hours 09/13/19 0728 09/13/19 0838   09/12/19 1100  vancomycin (VANCOCIN) IVPB 1000 mg/200 mL  premix  Status:  Discontinued     1,000 mg 200 mL/hr over 60 Minutes Intravenous Every 24 hours 09/11/19 1034 09/13/19 0728   09/11/19 1130  piperacillin-tazobactam (ZOSYN) IVPB 3.375 g  Status:  Discontinued     3.375 g 12.5 mL/hr over 240 Minutes Intravenous Every 8 hours 09/11/19 1054 09/14/19 0845   09/11/19 1100  vancomycin (VANCOCIN)  1,500 mg in sodium chloride 0.9 % 500 mL IVPB     1,500 mg 250 mL/hr over 120 Minutes Intravenous  Once 09/11/19 1034 09/11/19 1900   09/11/19 1015  piperacillin-tazobactam (ZOSYN) IVPB 4.5 g  Status:  Discontinued     4.5 g 200 mL/hr over 30 Minutes Intravenous Every 8 hours 09/11/19 1010 09/11/19 1052   09/09/19 0845  ceFAZolin (ANCEF) IVPB 2g/100 mL premix     2 g 200 mL/hr over 30 Minutes Intravenous Every 8 hours 09/09/19 0842 09/09/19 1658     Objective: Vitals:   04/26/20 1623 04/26/20 2026 04/27/20 0008 04/27/20 0445  BP: (!) 128/96 (!) 133/98 (!) 111/92 119/90  Pulse: 81 82 88 70  Resp: 18 18 16 18   Temp: 98 F (36.7 C) 97.8 F (36.6 C) 98.2 F (36.8 C) 97.6 F (36.4 C)  TempSrc: Oral Oral Oral Oral  SpO2: 100% 100% 99% 99%  Weight:      Height:        Intake/Output Summary (Last 24 hours) at 04/27/2020 1042 Last data filed at 04/26/2020 1504 Gross per 24 hour  Intake 1422 ml  Output --  Net 1422 ml   Filed Weights   03/21/20 0338 04/07/20 0340 04/25/20 0354  Weight: 63.1 kg 63 kg 65 kg    Examination: Looks comfortable.  No obvious abnormal exam. Pulling on the sheets with left hand.  Right hand unable to move. Resisting care. PEG tube in place intact and dry.  Data Reviewed: I have personally reviewed following labs and imaging studies  CBC: Recent Labs  Lab 04/25/20 0733  WBC 7.4  NEUTROABS 3.4  HGB 13.6  HCT 44.5  MCV 83.3  PLT 573   Basic Metabolic Panel: Recent Labs  Lab 04/25/20 0733  NA 141  K 4.1  CL 100  CO2 29  GLUCOSE 107*  BUN 37*  CREATININE 1.70*  CALCIUM 10.2  MG 1.9  PHOS 4.4   GFR: Estimated Creatinine Clearance: 53.1 mL/min (A) (by C-G formula based on SCr of 1.7 mg/dL (H)). Liver Function Tests: Recent Labs  Lab 04/25/20 0733  AST 17  ALT 13  ALKPHOS 115  BILITOT 0.4  PROT 7.7  ALBUMIN 3.5   No results for input(s): LIPASE, AMYLASE in the last 168 hours. No results for input(s): AMMONIA in the last 168  hours. Coagulation Profile: No results for input(s): INR, PROTIME in the last 168 hours. Cardiac Enzymes: No results for input(s): CKTOTAL, CKMB, CKMBINDEX, TROPONINI in the last 168 hours. BNP (last 3 results) No results for input(s): PROBNP in the last 8760 hours. HbA1C: No results for input(s): HGBA1C in the last 72 hours. CBG: Recent Labs  Lab 04/26/20 0834 04/26/20 1149 04/26/20 1622 04/26/20 2031 04/27/20 0005  GLUCAP 96 143* 83 93 138*   Lipid Profile: No results for input(s): CHOL, HDL, LDLCALC, TRIG, CHOLHDL, LDLDIRECT in the last 72 hours. Thyroid Function Tests: No results for input(s): TSH, T4TOTAL, FREET4, T3FREE, THYROIDAB in the last 72 hours. Anemia Panel: No results for input(s): VITAMINB12, FOLATE, FERRITIN, TIBC, IRON, RETICCTPCT in the last 72 hours. Sepsis Labs: No  results for input(s): PROCALCITON, LATICACIDVEN in the last 168 hours.  No results found for this or any previous visit (from the past 240 hour(s)).    Radiology Studies: No results found.   Scheduled Meds: . amLODipine  10 mg Per Tube Daily  . chlorhexidine  15 mL Mouth Rinse BID  . enoxaparin (LOVENOX) injection  40 mg Subcutaneous Daily  . feeding supplement (JEVITY 1.2 CAL)  474 mL Per Tube 4x daily  . feeding supplement (PRO-STAT SUGAR FREE 64)  30 mL Per Tube QID  . free water  200 mL Per Tube Q4H  . insulin aspart  0-15 Units Subcutaneous Q4H  . levETIRAcetam  500 mg Oral BID  . mouth rinse  15 mL Mouth Rinse q12n4p  . metoprolol tartrate  25 mg Per Tube BID  . multivitamin  15 mL Per Tube Daily  . polyethylene glycol  17 g Per Tube BID  . QUEtiapine  50 mg Per Tube QHS   Continuous Infusions:   LOS: 231 days    Time spent: 25 min    Barb Merino, MD [facility] If 7PM-7AM, please contact night-coverage

## 2020-04-27 NOTE — Progress Notes (Signed)
Pt. Refused all AM meds and tube feeding.

## 2020-04-28 NOTE — Plan of Care (Signed)
  Problem: Clinical Measurements: Goal: Diagnostic test results will improve Outcome: Progressing Goal: Respiratory complications will improve Outcome: Progressing Goal: Cardiovascular complication will be avoided Outcome: Progressing   Problem: Activity: Goal: Risk for activity intolerance will decrease Outcome: Progressing   Problem: Nutrition: Goal: Adequate nutrition will be maintained Outcome: Progressing   Problem: Coping: Goal: Level of anxiety will decrease Outcome: Progressing   Problem: Elimination: Goal: Will not experience complications related to bowel motility Outcome: Progressing Goal: Will not experience complications related to urinary retention Outcome: Progressing   Problem: Pain Managment: Goal: General experience of comfort will improve Outcome: Progressing   Problem: Safety: Goal: Ability to remain free from injury will improve Outcome: Progressing   Problem: Skin Integrity: Goal: Risk for impaired skin integrity will decrease Outcome: Progressing   Problem: Education: Goal: Knowledge of disease or condition will improve Outcome: Progressing Goal: Knowledge of secondary prevention will improve Outcome: Progressing Goal: Knowledge of patient specific risk factors addressed and post discharge goals established will improve Outcome: Progressing Goal: Individualized Educational Video(s) Outcome: Progressing   Problem: Coping: Goal: Will verbalize positive feelings about self Outcome: Progressing Goal: Will identify appropriate support needs Outcome: Progressing   Problem: Health Behavior/Discharge Planning: Goal: Ability to manage health-related needs will improve Outcome: Progressing   Problem: Self-Care: Goal: Ability to participate in self-care as condition permits will improve Outcome: Progressing Goal: Ability to communicate needs accurately will improve Outcome: Progressing   Problem: Nutrition: Goal: Risk of aspiration will  decrease Outcome: Progressing Goal: Dietary intake will improve Outcome: Progressing   Problem: Intracerebral Hemorrhage Tissue Perfusion: Goal: Complications of Intracerebral Hemorrhage will be minimized Outcome: Progressing

## 2020-04-28 NOTE — Progress Notes (Signed)
PROGRESS NOTE    Joshua [name]  IHK:[id number] DOB: 1981-08-21 DOA: 09/09/2019 PCP: Patient, [name]   Brief Narrative: 39 year old African-American male admitted by neurosurgery on 09/09/2019 for seizure, right hemiplegia and decreased responsiveness found to have left hemispheric intracranial hemorrhage, intubated and admitted to ICU. Patient underwent craniotomy for decompression on 09/09/2019 by Dr. Vertell Limber, patient showed slow neurological recovery and prolonged hospitalization due to seizure and aspiration pneumonia.Patient continued to be awake but significant dysphagia, PEG tube was placed on 09/25/2019. Placement has been challenging as patient is uninsured and needs long-term care. CT abdomen pelvis 1/11 was negative for obstruction. On 03/19/2020 the patient's PEG tube sustained damage externally. Nursing has confirmed that it is still functional and can be clamped and flushed without complication.  Has been refusing care blood draws and examination.Palliative care has been consulted to help reconcile the patient's refusal of all interventions or exams with his full code status. Awaiting on placement.   Subjective: No overnight events.  As usual refusing care.  Looks comfortable.   Assessment & Plan:   Left Intracranial hemorrhage, likely from uncontrolled hypertension status post decompressive craniectomy and hematoma evacuation 09/09/19.CT head no vascular malformation.  Patient remains aphasic and right hemiplegia with cognitive impairment.  Refusing to participate in the care: Has been refusing oral medication and blood draws.  Palliative care was consulted to help reconcile patient's refusal of care, mother wants full code.  Psychiatry had seen the patient.  They deemed patient is unable to make his decisions and not competent to make his decisions.  He will need long-term care.  CKD stage IIIa Creatinine 1.7.  Fairly stable.  Seizure disorder: Continue Keppra, use ativan if  seizure.   Hypertension: Blood pressure is well controlled.  On amlodipine .  Hydralazine discontinued.  Klebsiella UTI -was treated with antibiotics previously  Acute respiratory failure: Resolved  Aspiration pneumonia/chest congestion: Resolved  Nutrition: s/p PEG tube placement, on tube feeding.  Tolerating well.  DVT Prophylaxis: Lovenox CODE STATUS: Full Code.  Family communication/goals of care:palliative care following.   Status is: Inpatient  Remains inpatient appropriate because:Unsafe d/c plan Dispo: The patient is from: Home              Anticipated d/c is to: SNF              Anticipated d/c date is: > 3 days              Patient currently is medically stable to d/c.  Consultants:   Trauma  PCCM  Neurosurgery  Neurology  Procedures:   Antimicrobials:  Anti-infectives (From admission, onward)   Start     Dose/Rate Route Frequency Ordered Stop   09/27/19 1130  cephALEXin (KEFLEX) 250 MG/5ML suspension 500 mg     500 mg Per Tube Every 12 hours 09/27/19 1052 09/29/19 2250   09/25/19 1445  ceFAZolin (ANCEF) IVPB 2g/100 mL premix  Status:  Discontinued     2 g 200 mL/hr over 30 Minutes Intravenous Every 12 hours 09/25/19 1417 09/27/19 1052   09/14/19 1000  Ampicillin-Sulbactam (UNASYN) 3 g in sodium chloride 0.9 % 100 mL IVPB     3 g 200 mL/hr over 30 Minutes Intravenous Every 8 hours 09/14/19 0922 09/17/19 1816   09/13/19 1100  vancomycin (VANCOCIN) IVPB 750 mg/150 ml premix  Status:  Discontinued     750 mg 150 mL/hr over 60 Minutes Intravenous Every 24 hours 09/13/19 0728 09/13/19 0838   09/12/19 1100  vancomycin (VANCOCIN)  IVPB 1000 mg/200 mL premix  Status:  Discontinued     1,000 mg 200 mL/hr over 60 Minutes Intravenous Every 24 hours 09/11/19 1034 09/13/19 0728   09/11/19 1130  piperacillin-tazobactam (ZOSYN) IVPB 3.375 g  Status:  Discontinued     3.375 g 12.5 mL/hr over 240 Minutes Intravenous Every 8 hours 09/11/19 1054 09/14/19 0845   09/11/19  1100  vancomycin (VANCOCIN) 1,500 mg in sodium chloride 0.9 % 500 mL IVPB     1,500 mg 250 mL/hr over 120 Minutes Intravenous  Once 09/11/19 1034 09/11/19 1900   09/11/19 1015  piperacillin-tazobactam (ZOSYN) IVPB 4.5 g  Status:  Discontinued     4.5 g 200 mL/hr over 30 Minutes Intravenous Every 8 hours 09/11/19 1010 09/11/19 1052   09/09/19 0845  ceFAZolin (ANCEF) IVPB 2g/100 mL premix     2 g 200 mL/hr over 30 Minutes Intravenous Every 8 hours 09/09/19 0842 09/09/19 1658     Objective: Vitals:   04/27/20 2347 04/28/20 0412 04/28/20 0501 04/28/20 0822  BP: 121/89 (!) 107/91  (!) 122/103  Pulse: 90 72  84  Resp: 18 16  20   Temp: 98.3 F (36.8 C) (!) 97.5 F (36.4 C)  (!) 97.4 F (36.3 C)  TempSrc: Oral Oral  Oral  SpO2: 96% 98%  100%  Weight:   42 kg   Height:       No intake or output data in the 24 hours ending 04/28/20 1151 Filed Weights   04/07/20 0340 04/25/20 0354 04/28/20 0501  Weight: 63 kg 65 kg 42 kg    Examination: Looks comfortable.  Data Reviewed: I have personally reviewed following labs and imaging studies  CBC: Recent Labs  Lab 04/25/20 0733  WBC 7.4  NEUTROABS 3.4  HGB 13.6  HCT 44.5  MCV 83.3  PLT 798   Basic Metabolic Panel: Recent Labs  Lab 04/25/20 0733  NA 141  K 4.1  CL 100  CO2 29  GLUCOSE 107*  BUN 37*  CREATININE 1.70*  CALCIUM 10.2  MG 1.9  PHOS 4.4   GFR: Estimated Creatinine Clearance: 34.3 mL/min (A) (by C-G formula based on SCr of 1.7 mg/dL (H)). Liver Function Tests: Recent Labs  Lab 04/25/20 0733  AST 17  ALT 13  ALKPHOS 115  BILITOT 0.4  PROT 7.7  ALBUMIN 3.5   No results for input(s): LIPASE, AMYLASE in the last 168 hours. No results for input(s): AMMONIA in the last 168 hours. Coagulation Profile: No results for input(s): INR, PROTIME in the last 168 hours. Cardiac Enzymes: No results for input(s): CKTOTAL, CKMB, CKMBINDEX, TROPONINI in the last 168 hours. BNP (last 3 results) No results for  input(s): PROBNP in the last 8760 hours. HbA1C: No results for input(s): HGBA1C in the last 72 hours. CBG: Recent Labs  Lab 04/26/20 0834 04/26/20 1149 04/26/20 1622 04/26/20 2031 04/27/20 0005  GLUCAP 96 143* 83 93 138*   Lipid Profile: No results for input(s): CHOL, HDL, LDLCALC, TRIG, CHOLHDL, LDLDIRECT in the last 72 hours. Thyroid Function Tests: No results for input(s): TSH, T4TOTAL, FREET4, T3FREE, THYROIDAB in the last 72 hours. Anemia Panel: No results for input(s): VITAMINB12, FOLATE, FERRITIN, TIBC, IRON, RETICCTPCT in the last 72 hours. Sepsis Labs: No results for input(s): PROCALCITON, LATICACIDVEN in the last 168 hours.  No results found for this or any previous visit (from the past 240 hour(s)).    Radiology Studies: No results found.   Scheduled Meds: . amLODipine  10 mg Per Tube  Daily  . chlorhexidine  15 mL Mouth Rinse BID  . enoxaparin (LOVENOX) injection  40 mg Subcutaneous Daily  . feeding supplement (JEVITY 1.2 CAL)  474 mL Per Tube 4x daily  . feeding supplement (PRO-STAT SUGAR FREE 64)  30 mL Per Tube QID  . free water  200 mL Per Tube Q4H  . insulin aspart  0-15 Units Subcutaneous Q4H  . levETIRAcetam  500 mg Oral BID  . mouth rinse  15 mL Mouth Rinse q12n4p  . metoprolol tartrate  25 mg Per Tube BID  . multivitamin  15 mL Per Tube Daily  . polyethylene glycol  17 g Per Tube BID  . QUEtiapine  50 mg Per Tube QHS   Continuous Infusions:   LOS: 232 days    Time spent: 25 min    Barb Merino, MD [facility] If 7PM-7AM, please contact night-coverage

## 2020-04-28 NOTE — Progress Notes (Signed)
Patient refused morning and afternoon insulin coverage. MD made aware. Will continue to monitor. Gwendolyn Grant, RN

## 2020-04-29 LAB — GLUCOSE, CAPILLARY
Glucose-Capillary: 101 mg/dL — ABNORMAL HIGH (ref 70–99)
Glucose-Capillary: 101 mg/dL — ABNORMAL HIGH (ref 70–99)
Glucose-Capillary: 105 mg/dL — ABNORMAL HIGH (ref 70–99)
Glucose-Capillary: 109 mg/dL — ABNORMAL HIGH (ref 70–99)
Glucose-Capillary: 125 mg/dL — ABNORMAL HIGH (ref 70–99)
Glucose-Capillary: 132 mg/dL — ABNORMAL HIGH (ref 70–99)
Glucose-Capillary: 133 mg/dL — ABNORMAL HIGH (ref 70–99)
Glucose-Capillary: 137 mg/dL — ABNORMAL HIGH (ref 70–99)
Glucose-Capillary: 140 mg/dL — ABNORMAL HIGH (ref 70–99)
Glucose-Capillary: 79 mg/dL (ref 70–99)
Glucose-Capillary: 81 mg/dL (ref 70–99)
Glucose-Capillary: 84 mg/dL (ref 70–99)
Glucose-Capillary: 85 mg/dL (ref 70–99)
Glucose-Capillary: 87 mg/dL (ref 70–99)
Glucose-Capillary: 89 mg/dL (ref 70–99)
Glucose-Capillary: 90 mg/dL (ref 70–99)
Glucose-Capillary: 91 mg/dL (ref 70–99)
Glucose-Capillary: 97 mg/dL (ref 70–99)
Glucose-Capillary: 98 mg/dL (ref 70–99)

## 2020-04-29 MED ORDER — LEVETIRACETAM 100 MG/ML PO SOLN
500.0000 mg | Freq: Two times a day (BID) | ORAL | Status: DC
  Administered 2020-04-29 – 2020-05-02 (×7): 500 mg
  Filled 2020-04-29 (×7): qty 5

## 2020-04-29 NOTE — Progress Notes (Signed)
PROGRESS NOTE    Joshua [name]  GEX:[id number] DOB: [birth year] DOA: 09/09/2019 PCP: Patient, [name]   Brief Narrative: 39 year old African-American male admitted by neurosurgery on 09/09/2019 for seizure, right hemiplegia and decreased responsiveness found to have left hemispheric intracranial hemorrhage, intubated and admitted to ICU. Patient underwent craniotomy for decompression on 09/09/2019 by Dr. Vertell Limber, patient showed slow neurological recovery and prolonged hospitalization due to seizure and aspiration pneumonia.Patient continued to be awake but significant dysphagia, PEG tube was placed on 09/25/2019. Placement has been challenging as patient is uninsured and needs long-term care. CT abdomen pelvis 1/11 was negative for obstruction. On 03/19/2020 the patient's PEG tube sustained damage externally. Nursing has confirmed that it is still functional and can be clamped and flushed without complication.  Has been refusing care blood draws and examination.Palliative care has been consulted to help reconcile the patient's refusal of all interventions or exams with his full code status. Awaiting on placement.   Subjective: No overnight events.  Patient looks comfortable.  He did allow tube feeding but did not take medications.   Assessment & Plan:   Left Intracranial hemorrhage, likely from uncontrolled hypertension status post decompressive craniectomy and hematoma evacuation 09/09/19.CT head no vascular malformation.  Patient remains aphasic and right hemiplegia with cognitive impairment.  Refusing to participate in the care: Has been refusing oral medication and blood draws.  Palliative care was consulted to help reconcile patient's refusal of care, mother wants full code.  Psychiatry had seen the patient.  They deemed patient is unable to make his decisions and not competent to make his decisions.  He will need long-term care.  CKD stage IIIa Creatinine 1.7.  Fairly stable.  Seizure  disorder: Continue Keppra, use ativan if seizure.   Hypertension: Blood pressure is well controlled.  On amlodipine .  Hydralazine discontinued.  Klebsiella UTI -was treated with antibiotics previously  Acute respiratory failure: Resolved  Aspiration pneumonia/chest congestion: Resolved  Nutrition: s/p PEG tube placement, on tube feeding.  Tolerating well.  DVT Prophylaxis: Lovenox CODE STATUS: Full Code.  Family communication/goals of care: Nursing updating patient's mother.   Status is: Inpatient  Remains inpatient appropriate because:Unsafe d/c plan Dispo: The patient is from: Home              Anticipated d/c is to: SNF              Anticipated d/c date is: > 3 days              Patient currently is medically stable to d/c.  Consultants:   Trauma  PCCM  Neurosurgery  Neurology  Procedures:   Antimicrobials:  Anti-infectives (From admission, onward)   Start     Dose/Rate Route Frequency Ordered Stop   09/27/19 1130  cephALEXin (KEFLEX) 250 MG/5ML suspension 500 mg     500 mg Per Tube Every 12 hours 09/27/19 1052 09/29/19 2250   09/25/19 1445  ceFAZolin (ANCEF) IVPB 2g/100 mL premix  Status:  Discontinued     2 g 200 mL/hr over 30 Minutes Intravenous Every 12 hours 09/25/19 1417 09/27/19 1052   09/14/19 1000  Ampicillin-Sulbactam (UNASYN) 3 g in sodium chloride 0.9 % 100 mL IVPB     3 g 200 mL/hr over 30 Minutes Intravenous Every 8 hours 09/14/19 0922 09/17/19 1816   09/13/19 1100  vancomycin (VANCOCIN) IVPB 750 mg/150 ml premix  Status:  Discontinued     750 mg 150 mL/hr over 60 Minutes Intravenous Every 24 hours 09/13/19 0728  09/13/19 0838   09/12/19 1100  vancomycin (VANCOCIN) IVPB 1000 mg/200 mL premix  Status:  Discontinued     1,000 mg 200 mL/hr over 60 Minutes Intravenous Every 24 hours 09/11/19 1034 09/13/19 0728   09/11/19 1130  piperacillin-tazobactam (ZOSYN) IVPB 3.375 g  Status:  Discontinued     3.375 g 12.5 mL/hr over 240 Minutes Intravenous  Every 8 hours 09/11/19 1054 09/14/19 0845   09/11/19 1100  vancomycin (VANCOCIN) 1,500 mg in sodium chloride 0.9 % 500 mL IVPB     1,500 mg 250 mL/hr over 120 Minutes Intravenous  Once 09/11/19 1034 09/11/19 1900   09/11/19 1015  piperacillin-tazobactam (ZOSYN) IVPB 4.5 g  Status:  Discontinued     4.5 g 200 mL/hr over 30 Minutes Intravenous Every 8 hours 09/11/19 1010 09/11/19 1052   09/09/19 0845  ceFAZolin (ANCEF) IVPB 2g/100 mL premix     2 g 200 mL/hr over 30 Minutes Intravenous Every 8 hours 09/09/19 0842 09/09/19 1658     Objective: Vitals:   04/28/20 0822 04/28/20 1555 04/28/20 2353 04/29/20 0828  BP: (!) 122/103 125/88 (!) 119/98 (!) 150/98  Pulse: 84 95 87 80  Resp: 20 18 16 18   Temp: (!) 97.4 F (36.3 C) 98.6 F (37 C) 98.5 F (36.9 C) 98.6 F (37 C)  TempSrc: Oral  Oral Oral  SpO2: 100% 95% 95% 95%  Weight:      Height:       No intake or output data in the 24 hours ending 04/29/20 1030 Filed Weights   04/07/20 0340 04/25/20 0354 04/28/20 0501  Weight: 63 kg 65 kg 42 kg    Examination: Looks comfortable.  On room air.  Nods his head.  Pulls bed sheets with left hand.  Data Reviewed: I have personally reviewed following labs and imaging studies  CBC: Recent Labs  Lab 04/25/20 0733  WBC 7.4  NEUTROABS 3.4  HGB 13.6  HCT 44.5  MCV 83.3  PLT 944   Basic Metabolic Panel: Recent Labs  Lab 04/25/20 0733  NA 141  K 4.1  CL 100  CO2 29  GLUCOSE 107*  BUN 37*  CREATININE 1.70*  CALCIUM 10.2  MG 1.9  PHOS 4.4   GFR: Estimated Creatinine Clearance: 34.3 mL/min (A) (by C-G formula based on SCr of 1.7 mg/dL (H)). Liver Function Tests: Recent Labs  Lab 04/25/20 0733  AST 17  ALT 13  ALKPHOS 115  BILITOT 0.4  PROT 7.7  ALBUMIN 3.5   No results for input(s): LIPASE, AMYLASE in the last 168 hours. No results for input(s): AMMONIA in the last 168 hours. Coagulation Profile: No results for input(s): INR, PROTIME in the last 168 hours. Cardiac  Enzymes: No results for input(s): CKTOTAL, CKMB, CKMBINDEX, TROPONINI in the last 168 hours. BNP (last 3 results) No results for input(s): PROBNP in the last 8760 hours. HbA1C: No results for input(s): HGBA1C in the last 72 hours. CBG: Recent Labs  Lab 04/28/20 1325 04/28/20 1627 04/28/20 2024 04/29/20 0037 04/29/20 0824  GLUCAP 140* 79 101* 105* 97   Lipid Profile: No results for input(s): CHOL, HDL, LDLCALC, TRIG, CHOLHDL, LDLDIRECT in the last 72 hours. Thyroid Function Tests: No results for input(s): TSH, T4TOTAL, FREET4, T3FREE, THYROIDAB in the last 72 hours. Anemia Panel: No results for input(s): VITAMINB12, FOLATE, FERRITIN, TIBC, IRON, RETICCTPCT in the last 72 hours. Sepsis Labs: No results for input(s): PROCALCITON, LATICACIDVEN in the last 168 hours.  No results found for this or any  previous visit (from the past 240 hour(s)).    Radiology Studies: No results found.   Scheduled Meds: . amLODipine  10 mg Per Tube Daily  . chlorhexidine  15 mL Mouth Rinse BID  . enoxaparin (LOVENOX) injection  40 mg Subcutaneous Daily  . feeding supplement (JEVITY 1.2 CAL)  474 mL Per Tube 4x daily  . feeding supplement (PRO-STAT SUGAR FREE 64)  30 mL Per Tube QID  . free water  200 mL Per Tube Q4H  . insulin aspart  0-15 Units Subcutaneous Q4H  . levETIRAcetam  500 mg Per Tube BID  . mouth rinse  15 mL Mouth Rinse q12n4p  . metoprolol tartrate  25 mg Per Tube BID  . multivitamin  15 mL Per Tube Daily  . polyethylene glycol  17 g Per Tube BID  . QUEtiapine  50 mg Per Tube QHS   Continuous Infusions:   LOS: 233 days    Time spent: 25 min    Barb Merino, MD [facility] If 7PM-7AM, please contact night-coverage

## 2020-04-29 NOTE — Progress Notes (Signed)
Patient refusing afternoon insulin coverage. Patient educated but still refusing to take insulin. MD notified. Nurse will continue to monitor. Gwendolyn Grant, RN

## 2020-04-29 NOTE — Progress Notes (Signed)
Patient refusing to let NT check blood sugars; educated patient and patient still refusing at this time.

## 2020-04-30 LAB — GLUCOSE, CAPILLARY
Glucose-Capillary: 97 mg/dL (ref 70–99)
Glucose-Capillary: 95 mg/dL (ref 70–99)
Glucose-Capillary: 95 mg/dL (ref 70–99)
Glucose-Capillary: 105 mg/dL — ABNORMAL HIGH (ref 70–99)
Glucose-Capillary: 95 mg/dL (ref 70–99)

## 2020-04-30 NOTE — Progress Notes (Signed)
PROGRESS NOTE    Joshua [name]  HVF:[id number] DOB: [birth year] DOA: 09/09/2019 PCP: Patient, [name]   Brief Narrative: 39 year old African-American male admitted by neurosurgery on 09/09/2019 for seizure, right hemiplegia and decreased responsiveness found to have left hemispheric intracranial hemorrhage, intubated and admitted to ICU. Patient underwent craniotomy for decompression on 09/09/2019 by Dr. Vertell Limber, patient showed slow neurological recovery and prolonged hospitalization due to seizure and aspiration pneumonia.Patient continued to be awake but significant dysphagia, PEG tube was placed on 09/25/2019. Placement has been challenging as patient is uninsured and needs long-term care. CT abdomen pelvis 1/11 was negative for obstruction. On 03/19/2020 the patient's PEG tube sustained damage externally. Nursing has confirmed that it is still functional and can be clamped and flushed without complication.  Has been refusing care blood draws and examination.Palliative care has been consulted to help reconcile the patient's refusal of all interventions or exams with his full code status. Awaiting on placement.   Subjective: No overnight events.  Patient looks comfortable.  Allows for tube feeding.  Most of the time does not allow blood sugar check or medications.   Assessment & Plan:   Left Intracranial hemorrhage, likely from uncontrolled hypertension status post decompressive craniectomy and hematoma evacuation 09/09/19.CT head no vascular malformation.  Patient remains aphasic and right hemiplegia with cognitive impairment.  Refusing to participate in the care: Has been refusing oral medication and blood draws.  Palliative care was consulted to help reconcile patient's refusal of care, mother wants full code.  Psychiatry had seen the patient.  They deemed patient is unable to make his decisions and not competent to make his decisions.  He will need long-term care.  CKD stage IIIa Creatinine  1.7.  Fairly stable.  Seizure disorder: Continue Keppra, use ativan if seizure.   Hypertension: Blood pressure is well controlled.  On amlodipine .  Hydralazine discontinued.  Klebsiella UTI -was treated with antibiotics previously  Acute respiratory failure: Resolved  Aspiration pneumonia/chest congestion: Resolved  Nutrition: s/p PEG tube placement, on tube feeding.  Tolerating well.  DVT Prophylaxis: Lovenox CODE STATUS: Full Code.  Family communication/goals of care: Nursing updating patient's mother.   Status is: Inpatient  Remains inpatient appropriate because:Unsafe d/c plan Dispo: The patient is from: Home              Anticipated d/c is to: SNF              Anticipated d/c date is: > 3 days              Patient currently is medically stable to d/c.  Consultants:   Trauma  PCCM  Neurosurgery  Neurology  Procedures:   Antimicrobials:  Anti-infectives (From admission, onward)   Start     Dose/Rate Route Frequency Ordered Stop   09/27/19 1130  cephALEXin (KEFLEX) 250 MG/5ML suspension 500 mg     500 mg Per Tube Every 12 hours 09/27/19 1052 09/29/19 2250   09/25/19 1445  ceFAZolin (ANCEF) IVPB 2g/100 mL premix  Status:  Discontinued     2 g 200 mL/hr over 30 Minutes Intravenous Every 12 hours 09/25/19 1417 09/27/19 1052   09/14/19 1000  Ampicillin-Sulbactam (UNASYN) 3 g in sodium chloride 0.9 % 100 mL IVPB     3 g 200 mL/hr over 30 Minutes Intravenous Every 8 hours 09/14/19 0922 09/17/19 1816   09/13/19 1100  vancomycin (VANCOCIN) IVPB 750 mg/150 ml premix  Status:  Discontinued     750 mg 150 mL/hr over 60  Minutes Intravenous Every 24 hours 09/13/19 0728 09/13/19 0838   09/12/19 1100  vancomycin (VANCOCIN) IVPB 1000 mg/200 mL premix  Status:  Discontinued     1,000 mg 200 mL/hr over 60 Minutes Intravenous Every 24 hours 09/11/19 1034 09/13/19 0728   09/11/19 1130  piperacillin-tazobactam (ZOSYN) IVPB 3.375 g  Status:  Discontinued     3.375 g 12.5 mL/hr  over 240 Minutes Intravenous Every 8 hours 09/11/19 1054 09/14/19 0845   09/11/19 1100  vancomycin (VANCOCIN) 1,500 mg in sodium chloride 0.9 % 500 mL IVPB     1,500 mg 250 mL/hr over 120 Minutes Intravenous  Once 09/11/19 1034 09/11/19 1900   09/11/19 1015  piperacillin-tazobactam (ZOSYN) IVPB 4.5 g  Status:  Discontinued     4.5 g 200 mL/hr over 30 Minutes Intravenous Every 8 hours 09/11/19 1010 09/11/19 1052   09/09/19 0845  ceFAZolin (ANCEF) IVPB 2g/100 mL premix     2 g 200 mL/hr over 30 Minutes Intravenous Every 8 hours 09/09/19 0842 09/09/19 1658     Objective: Vitals:   04/29/20 1744 04/29/20 1939 04/30/20 0001 04/30/20 0718  BP: (!) 127/98 132/90 (!) 132/91 (!) 120/91  Pulse: 78 81 74 72  Resp: 18 18 18 18   Temp: 98.4 F (36.9 C) 97.6 F (36.4 C) 98.2 F (36.8 C) 97.8 F (36.6 C)  TempSrc: Axillary Oral Axillary Oral  SpO2:  100% 100% 94%  Weight:      Height:        Intake/Output Summary (Last 24 hours) at 04/30/2020 1121 Last data filed at 04/30/2020 0358 Gross per 24 hour  Intake 660 ml  Output 0 ml  Net 660 ml   Filed Weights   04/07/20 0340 04/25/20 0354 04/28/20 0501  Weight: 63 kg 65 kg 42 kg    Examination: Looks comfortable.  On room air.  Nods his head.  Pulls bed sheets with left hand.  Data Reviewed: I have personally reviewed following labs and imaging studies  CBC: Recent Labs  Lab 04/25/20 0733  WBC 7.4  NEUTROABS 3.4  HGB 13.6  HCT 44.5  MCV 83.3  PLT 329   Basic Metabolic Panel: Recent Labs  Lab 04/25/20 0733  NA 141  K 4.1  CL 100  CO2 29  GLUCOSE 107*  BUN 37*  CREATININE 1.70*  CALCIUM 10.2  MG 1.9  PHOS 4.4   GFR: Estimated Creatinine Clearance: 34.3 mL/min (A) (by C-G formula based on SCr of 1.7 mg/dL (H)). Liver Function Tests: Recent Labs  Lab 04/25/20 0733  AST 17  ALT 13  ALKPHOS 115  BILITOT 0.4  PROT 7.7  ALBUMIN 3.5   No results for input(s): LIPASE, AMYLASE in the last 168 hours. No results for  input(s): AMMONIA in the last 168 hours. Coagulation Profile: No results for input(s): INR, PROTIME in the last 168 hours. Cardiac Enzymes: No results for input(s): CKTOTAL, CKMB, CKMBINDEX, TROPONINI in the last 168 hours. BNP (last 3 results) No results for input(s): PROBNP in the last 8760 hours. HbA1C: No results for input(s): HGBA1C in the last 72 hours. CBG: Recent Labs  Lab 04/29/20 1553 04/29/20 2000 04/29/20 2358 04/30/20 0409 04/30/20 0723  GLUCAP 87 137* 125* 97 95   Lipid Profile: No results for input(s): CHOL, HDL, LDLCALC, TRIG, CHOLHDL, LDLDIRECT in the last 72 hours. Thyroid Function Tests: No results for input(s): TSH, T4TOTAL, FREET4, T3FREE, THYROIDAB in the last 72 hours. Anemia Panel: No results for input(s): VITAMINB12, FOLATE, FERRITIN, TIBC, IRON,  RETICCTPCT in the last 72 hours. Sepsis Labs: No results for input(s): PROCALCITON, LATICACIDVEN in the last 168 hours.  No results found for this or any previous visit (from the past 240 hour(s)).    Radiology Studies: No results found.   Scheduled Meds: . amLODipine  10 mg Per Tube Daily  . chlorhexidine  15 mL Mouth Rinse BID  . enoxaparin (LOVENOX) injection  40 mg Subcutaneous Daily  . feeding supplement (JEVITY 1.2 CAL)  474 mL Per Tube 4x daily  . feeding supplement (PRO-STAT SUGAR FREE 64)  30 mL Per Tube QID  . free water  200 mL Per Tube Q4H  . insulin aspart  0-15 Units Subcutaneous Q4H  . levETIRAcetam  500 mg Per Tube BID  . mouth rinse  15 mL Mouth Rinse q12n4p  . metoprolol tartrate  25 mg Per Tube BID  . multivitamin  15 mL Per Tube Daily  . polyethylene glycol  17 g Per Tube BID  . QUEtiapine  50 mg Per Tube QHS   Continuous Infusions:   LOS: 234 days    Time spent: 25 min    Barb Merino, MD [facility] If 7PM-7AM, please contact night-coverage

## 2020-04-30 NOTE — TOC Progression Note (Signed)
Transition of Care [facility]) - Progression Note    Patient Details  Name: Karlo Goeden MRN: [id number] Date of Birth: 1981-09-25  Transition of Care [facility]) CM/SW [place], [place] Phone Number: 04/30/2020, 3:25 PM  Clinical Narrative:   CSW asked Eddie North and Helene Kelp to review referral on patient again now that his disability has been approved. Awaiting responses.    Expected Discharge Plan: Skilled Nursing Facility Barriers to Discharge: SNF Pending payor source - LOG, SNF Pending bed offer, Inadequate or no insurance  Expected Discharge Plan and Services Expected Discharge Plan: [place] In-house Referral: Clinical Social Work Discharge Planning Services: NA Post Acute Care Choice: [facility] Living arrangements for the past 2 months: Apartment                 DME Arranged: N/A DME Agency: NA       HH Arranged: NA HH Agency: NA         Social Determinants of Health (SDOH) Interventions    Readmission Risk Interventions No flowsheet data found.

## 2020-04-30 NOTE — Progress Notes (Signed)
Nutrition Follow-up  DOCUMENTATION CODES:   Severe malnutrition in context of acute illness/injury  INTERVENTION:  Continueto attempt to provide Jevity 1.2 formulavia PEG at goal volume of 474 ml (2 cartons/ARCs) given QID.   Continue attempting to offer 30 ml ProstatQID per PEG tube.   Continue offering free water flushes of200 ml every 4 hours per tube. (MD to adjust as appropriate)  Tube feeding to provide 2675 kcal, 165grams of protein, and2753m free water.  Continue PO intake for comfort as appropriate/tolerated.   NUTRITION DIAGNOSIS:   Severe Malnutrition related to acute illness(intracerebral hemorrage) as evidenced by severe fat depletion, severe muscle depletion.  Ongoing.  GOAL:   Patient will meet greater than or equal to 90% of their needs  Met with TF when pt accepts boluses.   MONITOR:   TF tolerance, Skin, Weight trends, Labs, I & O's  REASON FOR ASSESSMENT:   Ventilator, Consult Enteral/tube feeding initiation and management  ASSESSMENT:   39yo male admitted with large left intracranial hemorrhage; S/P craniotomy. PMH includes HTN, alcoholism, noncompliance.  9/12left frontal temporal craniectomy and evacuation hematoma.  9/23 Extubated.  10/1 PEG placed 11/23- Nutrition Focused Physical Exam performed, severe subcutaneous fat loss and severe muscle wasting identified. 3/23 pt's PEG tube sustained damage externally, but nursing confirmed it is still functional and can be clamped and flushed without complication. It is still in place.  Pt providing no response to RD questions. Discussed pt with RN who states pt has accepted boluses today.   Pt with no po intake in the last week.  Current tube feeding orders: 4743mJevity 1.2 cal QID, 3029mro-stat QID, 200m64mee water Q4H  Pt has not allowed labs since 01/17/20.  CBGs [id number] reviewed and include: Novolog, MVI, Miralax  Diet Order:   Diet Order            Diet regular Room service appropriate? Yes; Fluid consistency: Thin  Diet effective now              EDUCATION NEEDS:   Not appropriate for education at this time  Skin:  Skin Assessment: Reviewed RN Assessment Skin Integrity Issues:: Other (Comment) Other: skin tear right hip  Last BM:  5/3  Height:   Ht Readings from Last 1 Encounters:  09/09/19 '5\' 9"'  (1.753 m)    Weight:   Wt Readings from Last 1 Encounters:  04/28/20 42 kg    BMI:  Body mass index is 13.67 kg/m.  Estimated Nutritional Needs:   Kcal:  [id number]:  130-170 grams  Fluid:  >/= 2 L/day    AmanLarkin Ina, RD, LDN RD pager number and weekend/on-call pager number located in [place]

## 2020-05-01 LAB — GLUCOSE, CAPILLARY
Glucose-Capillary: 107 mg/dL — ABNORMAL HIGH (ref 70–99)
Glucose-Capillary: 111 mg/dL — ABNORMAL HIGH (ref 70–99)
Glucose-Capillary: 133 mg/dL — ABNORMAL HIGH (ref 70–99)
Glucose-Capillary: 83 mg/dL (ref 70–99)
Glucose-Capillary: 89 mg/dL (ref 70–99)

## 2020-05-01 LAB — RESPIRATORY PANEL BY RT PCR (FLU A&B, COVID)
Influenza A by PCR: NEGATIVE
Influenza B by PCR: NEGATIVE
SARS Coronavirus 2 by RT PCR: NEGATIVE

## 2020-05-01 MED ORDER — JEVITY 1.2 CAL PO LIQD: 474.0000 mL | Freq: Four times a day (QID) | ORAL | 0 refills | Status: DC

## 2020-05-01 MED ORDER — METOPROLOL TARTRATE 25 MG PO TABS: 25.0000 mg | ORAL_TABLET | Freq: Two times a day (BID) | ORAL | Status: DC

## 2020-05-01 MED ORDER — ACETAMINOPHEN 325 MG PO TABS: 650.0000 mg | ORAL_TABLET | ORAL | Status: AC | PRN

## 2020-05-01 MED ORDER — FREE WATER: 200.0000 mL | Status: AC

## 2020-05-01 MED ORDER — QUETIAPINE FUMARATE 50 MG PO TABS: 50.0000 mg | ORAL_TABLET | Freq: Every day | ORAL | Status: DC

## 2020-05-01 MED ORDER — POLYETHYLENE GLYCOL 3350 17 G PO PACK: 17.0000 g | PACK | Freq: Two times a day (BID) | ORAL | 0 refills | Status: DC

## 2020-05-01 MED ORDER — LEVETIRACETAM 100 MG/ML PO SOLN: 500.0000 mg | Freq: Two times a day (BID) | ORAL | 12 refills | Status: DC

## 2020-05-01 MED ORDER — PRO-STAT SUGAR FREE PO LIQD: 30.0000 mL | Freq: Four times a day (QID) | ORAL | 0 refills | Status: DC

## 2020-05-01 MED ORDER — AMLODIPINE BESYLATE 10 MG PO TABS: 10.0000 mg | ORAL_TABLET | Freq: Every day | ORAL | Status: DC

## 2020-05-01 NOTE — Progress Notes (Signed)
Have attempted to reach pts mother about SNF bed twice. Have left voicemail and am awaiting a return call.

## 2020-05-01 NOTE — Discharge Summary (Addendum)
Physician Discharge Summary  Joshua [name] MGQ:[id number] DOB: 04-05-1981 DOA: 09/09/2019  PCP: Patient, [name]  Admit date: 09/09/2019 Discharge date: 05/01/2020  Admitted From: Home Disposition: Nursing home  Recommendations for Outpatient Follow-up:  1. Follow up with PCP in 1-2 weeks 2. Please obtain BMP/CBC in one week    Discharge Condition: Stable CODE STATUS: Full code Diet recommendation: Regular diet with supervision.  Oral medications.  Discharge summary: Patient is going to nursing home today after staying in the hospital for 235 days.  He had various events happened as following. 39 year old African-American male admitted by neurosurgery on 09/09/2019 for seizure, right hemiplegia and decreased responsiveness found to have left hemispheric intracranial hemorrhage, intubated and admitted to ICU. Patient underwent craniotomy for decompression on 09/09/2019 by Dr. Vertell Limber, patient showed slow neurological recovery and prolonged hospitalization due to seizure and aspiration pneumonia.Patient continued to be awake but significant dysphagia, PEG tube was placed on 09/25/2019. Placement has been challenging as patient is uninsured and needed long-term care. CT abdomen pelvis 1/11 was negative for obstruction. On 03/19/2020 the patient's PEG tube sustained damage externally but working well.  Patient has right hemiplegia.  He has a PEG tube.  He can also eat regular diet.  He does refuse care many times.  His plan of care is as discussed below.  # Left Intracranial hemorrhage, likely from uncontrolled hypertension status post decompressive craniectomy and hematoma evacuation 09/09/19. CT head no vascular malformation.  Patient remains aphasic and right hemiplegia with cognitive impairment. Good blood pressure control.  On various blood pressure medications.  # Refusing to participate in the care: Intermittently refuses medications and blood draws.   At various times we were able to  counsel him and repeat his blood draws and they are all stable.   He has been able to eat some food, take oral medications as well as able to have tube feeding.   Seen by psychiatry, deemed unable to make his decisions.  His mother lives out of town and she is his next of kin and Media planner.  # CKD stage IIIa Creatinine 1.7.  Fairly stable.  # Seizure disorder: Continue Keppra.  No recurrent seizure for last many months.  # Hypertension: Blood pressure is well controlled on amlodipine and metoprolol.  # Nutrition: s/p PEG tube placement, on tube feeding.  Tolerating well.  Electrolytes are normal.  Patient is chronically sick, not debilitated, medically stable to transfer to skilled level of care.  Addendum, 05/02/2020 10 AM: Patient seen and examined.  Discharge summary reviewed.  No changes needed.  Discharge Diagnoses:  Principal Problem:   ICH (intracerebral hemorrhage) ([place]) Active Problems:   Subdural hematoma (HCC)   Intracerebral hemorrhage (HCC)   Cytotoxic brain edema (HCC)   Hemorrhagic stroke (HCC)   Acute respiratory failure (HCC)   Essential hypertension   Severe protein-calorie malnutrition (HCC)   CKD (chronic kidney disease), stage IV (HCC)   Seizure (HCC)   AKI (acute kidney injury) ([place])   PEG (percutaneous endoscopic gastrostomy) status ([place])   Palliative care by specialist   Goals of care, counseling/discussion    Discharge Instructions  Discharge Instructions    Ambulatory referral to Neurology   Complete by: As directed    Follow up with stroke clinic NP (Jessica Vanschaick or Cecille Rubin, if both not available, consider Zachery Dauer, or A[place]) at [facility] in about 4 weeks. Thanks.   Diet general   Complete by: As directed    Increase activity slowly  Complete by: As directed      Allergies as of 05/01/2020   No Known Allergies     Medication List    STOP taking these medications   GOODY HEADACHE PO   multivitamin with minerals  Tabs tablet     TAKE these medications   acetaminophen 325 MG tablet Commonly known as: TYLENOL Place 2 tablets (650 mg total) into feeding tube every 4 (four) hours as needed for mild pain (temp > 100.5).   amLODipine 10 MG tablet Commonly known as: NORVASC Place 1 tablet (10 mg total) into feeding tube daily. Start taking on: May 02, 2020   feeding supplement (JEVITY 1.2 CAL) Liqd Place 474 mLs into feeding tube in the morning, at noon, in the evening, and at bedtime.   feeding supplement (PRO-STAT SUGAR FREE 64) Liqd Place 30 mLs into feeding tube 4 (four) times daily.   free water Soln Place 200 mLs into feeding tube every 4 (four) hours.   levETIRAcetam 100 MG/ML solution Commonly known as: KEPPRA Place 5 mLs (500 mg total) into feeding tube 2 (two) times daily.   metoprolol tartrate 25 MG tablet Commonly known as: LOPRESSOR Place 1 tablet (25 mg total) into feeding tube 2 (two) times daily.   polyethylene glycol 17 g packet Commonly known as: MIRALAX / GLYCOLAX Place 17 g into feeding tube 2 (two) times daily.   QUEtiapine 50 MG tablet Commonly known as: SEROQUEL Place 1 tablet (50 mg total) into feeding tube at bedtime.            Durable Medical Equipment  (From admission, onward)         Start     Ordered   10/13/19 1335  For home use only DME 3 n 1  Once     10/13/19 1334   10/13/19 1334  For home use only DME Hospital bed  Once    Question Answer Comment  Length of Need 6 Months   The above medical condition requires: Patient requires the ability to reposition frequently   Head must be elevated greater than: 30 degrees   Bed type Semi-electric   Hoyer Lift Yes      10/13/19 1334   10/13/19 1333  For home use only DME standard manual wheelchair with seat cushion  Once    Comments: Patient suffers from ambulatory dysfunction which impairs their ability to perform daily activities like walking in the home.  A walking aid will not resolve issue with  performing activities of daily living. A wheelchair will allow patient to safely perform daily activities. Patient can safely propel the wheelchair in the home or has a caregiver who can provide assistance. Length of need 6 months. Accessories: elevating leg rests, wheel locks, extensions and anti-tippers.   10/13/19 1333         Follow-up Information    [facility]. Schedule an appointment as soon as possible for a visit in 4 week(s).   Specialty: Neurology Contact information: [address] [place] [phone]         No Known Allergies  Consultations:  Neurology  Neurosurgery  Palliative medicine  Psychiatry   Procedures/Studies:  No results found. (Echo, Carotid, EGD, Colonoscopy, ERCP)    Subjective: Patient seen and examined.  Looks comfortable.  As usual he refuses examination, he pulls his bed sheets and covers himself with his left hand.  Minimal to no movement on the right hand.  He did take all  his medications and tube feeding since last 24 hours.   Discharge Exam: Vitals:   05/01/20 0340 05/01/20 0723  BP: (!) 116/94 (!) 134/102  Pulse: 81 73  Resp: 19 18  Temp: 98.2 F (36.8 C) 97.6 F (36.4 C)  SpO2: 98% 100%   Vitals:   04/30/20 2016 04/30/20 2349 05/01/20 0340 05/01/20 0723  BP: (!) 123/95 (!) 122/98 (!) 116/94 (!) 134/102  Pulse: 96 91 81 73  Resp: 20 16 19 18   Temp: 98 F (36.7 C) 97.6 F (36.4 C) 98.2 F (36.8 C) 97.6 F (36.4 C)  TempSrc: Oral Oral Oral Oral  SpO2: 100% 97% 98% 100%  Weight:      Height:        General: Pt is alert, awake, not in acute distress, on room air.  Not much interactive but refuses examination. Cardiovascular: RRR, S1/S2 +, no rubs, no gallops, no added sound. Respiratory: CTA bilaterally, no wheezing, no rhonchi, no added sound Abdominal: Soft, NT, ND, bowel sounds +, PEG tube clean and intact. Extremities: no edema, no cyanosis Aphasic.   Nods his head to "NO" all the time. Resist examination on attempted movement.  Left upper and lower extremities normal.  He has weakness on the right upper and lower extremities.    The results of significant diagnostics from this hospitalization (including imaging, microbiology, ancillary and laboratory) are listed below for reference.     Microbiology: No results found for this or any previous visit (from the past 240 hour(s)).   Labs: BNP (last 3 results) No results for input(s): BNP in the last 8760 hours. Basic Metabolic Panel: Recent Labs  Lab 04/25/20 0733  NA 141  K 4.1  CL 100  CO2 29  GLUCOSE 107*  BUN 37*  CREATININE 1.70*  CALCIUM 10.2  MG 1.9  PHOS 4.4   Liver Function Tests: Recent Labs  Lab 04/25/20 0733  AST 17  ALT 13  ALKPHOS 115  BILITOT 0.4  PROT 7.7  ALBUMIN 3.5   No results for input(s): LIPASE, AMYLASE in the last 168 hours. No results for input(s): AMMONIA in the last 168 hours. CBC: Recent Labs  Lab 04/25/20 0733  WBC 7.4  NEUTROABS 3.4  HGB 13.6  HCT 44.5  MCV 83.3  PLT 274   Cardiac Enzymes: No results for input(s): CKTOTAL, CKMB, CKMBINDEX, TROPONINI in the last 168 hours. BNP: Invalid input(s): POCBNP CBG: Recent Labs  Lab 04/30/20 1552 04/30/20 2013 05/01/20 0014 05/01/20 0414 05/01/20 0725  GLUCAP 105* 95 133* 89 83   D-Dimer No results for input(s): DDIMER in the last 72 hours. Hgb A1c No results for input(s): HGBA1C in the last 72 hours. Lipid Profile No results for input(s): CHOL, HDL, LDLCALC, TRIG, CHOLHDL, LDLDIRECT in the last 72 hours. Thyroid function studies No results for input(s): TSH, T4TOTAL, T3FREE, THYROIDAB in the last 72 hours.  Invalid input(s): FREET3 Anemia work up No results for input(s): VITAMINB12, FOLATE, FERRITIN, TIBC, IRON, RETICCTPCT in the last 72 hours. Urinalysis    Component Value Date/Time   COLORURINE YELLOW (A) 09/23/2019 1108   APPEARANCEUR CLEAR (A) 09/23/2019 1108    LABSPEC 1.015 09/23/2019 1108   PHURINE 7.5 09/23/2019 1108   GLUCOSEU NEGATIVE 09/23/2019 1108   [place] 09/23/2019 1108   [place] 09/23/2019 1108   [place] 09/23/2019 1108   PROTEINUR NEGATIVE 09/23/2019 1108   UROBILINOGEN 1.0 04/05/2009 0021   NITRITE NEGATIVE 09/23/2019 1108   LEUKOCYTESUR NEGATIVE 09/23/2019 1108  Sepsis Labs Invalid input(s): PROCALCITONIN,  WBC,  LACTICIDVEN Microbiology No results found for this or any previous visit (from the past 240 hour(s)).   Time coordinating discharge:  40 minutes  SIGNED:   Barb Merino, MD  [facility] 05/01/2020, 10:07 AM

## 2020-05-02 LAB — GLUCOSE, CAPILLARY
Glucose-Capillary: 124 mg/dL — ABNORMAL HIGH (ref 70–99)
Glucose-Capillary: 72 mg/dL (ref 70–99)
Glucose-Capillary: 74 mg/dL (ref 70–99)
Glucose-Capillary: 86 mg/dL (ref 70–99)
Glucose-Capillary: 97 mg/dL (ref 70–99)

## 2020-05-02 NOTE — Progress Notes (Signed)
Nutrition Follow-up  DOCUMENTATION CODES:   Severe malnutrition in context of acute illness/injury  INTERVENTION:  Continueto attempt to provideJevity 1.2 formulavia PEG at goal volume of 474 ml (2 cartons/ARCs) given QID.   Continue attempting to offer30 ml ProstatQID perPEGtube.   Continue offering free water flushes of200 ml every 4 hours per tube. (MD to adjust as appropriate)  Tube feeding to provide 2675 kcal, 165grams of protein, and2796m free water.  Continue PO intake for comfort as appropriate/tolerated.   NUTRITION DIAGNOSIS:   Severe Malnutrition related to acute illness(intracerebral hemorrage) as evidenced by severe fat depletion, severe muscle depletion.  Ongoing.  GOAL:   Patient will meet greater than or equal to 90% of their needs  Met with TF.   MONITOR:   TF tolerance, Skin, Weight trends, Labs, I & O's  REASON FOR ASSESSMENT:   Ventilator, Consult Enteral/tube feeding initiation and management  ASSESSMENT:   39yo male admitted with large left intracranial hemorrhage; S/P craniotomy. PMH includes HTN, alcoholism, noncompliance.  9/12left frontal temporal craniectomy and evacuation hematoma.  9/23 Extubated.  10/1 PEG placed 11/23- Nutrition Focused Physical Exam performed, severe subcutaneous fat loss and severe muscle wasting identified. 3/23 pt's PEG tube sustained damage externally, but nursing confirmed it is still functional and can be clamped and flushed without complication. It is still in place.  Pt providing no response to RD questions. Discussed pt with RN.  Pt with no po intake in the last week.  Current tube feeding orders: 473mJevity 1.2 cal QID, 3022mro-stat QID, 200m54mee water Q4H  Admit wt: 63 kg Current wt: 63 kg  Pt has not allowed labs since 01/17/20.  CBGs 124-102-72-53dications reviewed and include:Novolog, MVI, Miralax  Diet Order:   Diet Order            Diet general       Diet regular Room service appropriate? Yes; Fluid consistency: Thin  Diet effective now              EDUCATION NEEDS:   Not appropriate for education at this time  Skin:  Skin Assessment: Reviewed RN Assessment Skin Integrity Issues:: Other (Comment) Other: skin tear right hip  Last BM:  5/3  Height:   Ht Readings from Last 1 Encounters:  09/09/19 _0  (1.753 m)    Weight:   Wt Readings from Last 1 Encounters:  05/02/20 63 kg    BMI:  Body mass index is 20.51 kg/m.  Estimated Nutritional Needs:   Kcal:  [id number]:  130-170 grams  Fluid:  >/= 2 L/day    AmanLarkin Ina, RD, LDN RD pager number and weekend/on-call pager number located in [place]

## 2020-05-02 NOTE — Plan of Care (Signed)
  Problem: Activity: Goal: Risk for activity intolerance will decrease 05/02/2020 1210 by Caroll Rancher, RN Outcome: Adequate for Discharge 05/02/2020 1205 by Caroll Rancher, RN Outcome: Progressing   Problem: Nutrition: Goal: Adequate nutrition will be maintained 05/02/2020 1210 by Caroll Rancher, RN Outcome: Adequate for Discharge 05/02/2020 1205 by Caroll Rancher, RN Outcome: Progressing   Problem: Coping: Goal: Level of anxiety will decrease 05/02/2020 1210 by Caroll Rancher, RN Outcome: Adequate for Discharge 05/02/2020 1205 by Caroll Rancher, RN Outcome: Progressing   Problem: Pain Managment: Goal: General experience of comfort will improve 05/02/2020 1210 by Caroll Rancher, RN Outcome: Adequate for Discharge 05/02/2020 1205 by Caroll Rancher, RN Outcome: Progressing   Problem: Safety: Goal: Ability to remain free from injury will improve 05/02/2020 1210 by Caroll Rancher, RN Outcome: Adequate for Discharge 05/02/2020 1205 by Caroll Rancher, RN Outcome: Progressing

## 2020-05-02 NOTE — TOC Transition Note (Signed)
Transition of Care [facility]) - CM/SW Discharge Note   Patient Details  Name: Keyshaun Exley MRN: [id number] Date of Birth: Mar 29, 1981  [facility] CM/SW Contact:  Geralynn Ochs, LCSW Phone Number: 05/02/2020, 11:50 AM   Clinical Narrative:   Nurse to call report to [phone], Room 207.  Transport set for 1:00 PM.    Final next level of care: [place] Barriers to Discharge: Barriers Resolved   Patient Goals and CMS Choice Patient states their goals for this hospitalization and ongoing recovery are:: Pt mother is agreeable to rehab CMS Medicare.gov Compare Post Acute Care list provided to:: Patient Represenative (must comment) Choice offered to / list presented to : Parent  Discharge Placement              Patient chooses bed at: [facility] Patient to be transferred to facility by: [place] Name of family member notified: Mother Patient and family notified of of transfer: 05/02/20  Discharge Plan and Services In-house Referral: Clinical Social Work Discharge Planning Services: NA Post Acute Care Choice: [place]          DME Arranged: N/A DME Agency: NA       HH Arranged: NA HH Agency: NA        Social Determinants of Health (SDOH) Interventions     Readmission Risk Interventions No flowsheet data found.

## 2020-05-02 NOTE — Care Management Important Message (Signed)
Important Message  Patient Details  Name: Joshua [name] MRN: [id number] Date of Birth: [birth year]   Medicare Important Message Given:  Yes     Dorette Hartel 05/02/2020, 4:03 PM

## 2020-05-02 NOTE — Progress Notes (Signed)
Patient seen and examined.  No change in his status.  His discharge summary was reviewed, no changes needed.  Can be discharged anytime when logistically appropriate.

## 2020-05-02 NOTE — Progress Notes (Signed)
Patient being discharged to [facility].  Report called.  Patient being transported by [facility].  Discharge instructions and prescription information placed in the packet for transport. No IV to remove.

## 2020-05-02 NOTE — Care Management Important Message (Signed)
Important Message  Patient Details  Name: Joshua [name] MRN: [id number] Date of Birth: June 04, 1981   Medicare Important Message Given:  Yes   Due to illness Patient was not able to sign.  Signed copy by me left with the patient belonging.   Corbin Hott 05/02/2020, 4:05 PM

## 2020-05-02 NOTE — Plan of Care (Signed)
  Problem: Activity: Goal: Risk for activity intolerance will decrease Outcome: Progressing   Problem: Nutrition: Goal: Adequate nutrition will be maintained Outcome: Progressing   Problem: Coping: Goal: Level of anxiety will decrease Outcome: Progressing   Problem: Pain Managment: Goal: General experience of comfort will improve Outcome: Progressing   Problem: Safety: Goal: Ability to remain free from injury will improve Outcome: Progressing   

## 2020-05-06 DIAGNOSIS — G8191 Hemiplegia, unspecified affecting right dominant side: Secondary | ICD-10-CM | POA: Diagnosis not present

## 2020-05-06 DIAGNOSIS — Z931 Gastrostomy status: Secondary | ICD-10-CM | POA: Diagnosis not present

## 2020-05-06 DIAGNOSIS — I129 Hypertensive chronic kidney disease with stage 1 through stage 4 chronic kidney disease, or unspecified chronic kidney disease: Secondary | ICD-10-CM | POA: Diagnosis not present

## 2020-05-06 DIAGNOSIS — I618 Other nontraumatic intracerebral hemorrhage: Secondary | ICD-10-CM | POA: Diagnosis not present

## 2020-05-06 DIAGNOSIS — E440 Moderate protein-calorie malnutrition: Secondary | ICD-10-CM | POA: Diagnosis not present

## 2020-05-06 DIAGNOSIS — I6389 Other cerebral infarction: Secondary | ICD-10-CM | POA: Diagnosis not present

## 2020-05-06 DIAGNOSIS — I10 Essential (primary) hypertension: Secondary | ICD-10-CM | POA: Diagnosis not present

## 2020-05-06 DIAGNOSIS — J690 Pneumonitis due to inhalation of food and vomit: Secondary | ICD-10-CM | POA: Diagnosis not present

## 2020-05-06 DIAGNOSIS — S065X9A Traumatic subdural hemorrhage with loss of consciousness of unspecified duration, initial encounter: Secondary | ICD-10-CM | POA: Diagnosis not present

## 2020-05-06 DIAGNOSIS — G4089 Other seizures: Secondary | ICD-10-CM | POA: Diagnosis not present

## 2020-05-06 DIAGNOSIS — F0151 Vascular dementia with behavioral disturbance: Secondary | ICD-10-CM | POA: Diagnosis not present

## 2020-05-06 DIAGNOSIS — R4701 Aphasia: Secondary | ICD-10-CM | POA: Diagnosis not present

## 2020-05-10 DIAGNOSIS — I619 Nontraumatic intracerebral hemorrhage, unspecified: Secondary | ICD-10-CM | POA: Diagnosis not present

## 2020-05-10 DIAGNOSIS — R4701 Aphasia: Secondary | ICD-10-CM | POA: Diagnosis not present

## 2020-05-10 DIAGNOSIS — G8191 Hemiplegia, unspecified affecting right dominant side: Secondary | ICD-10-CM | POA: Diagnosis not present

## 2020-05-10 DIAGNOSIS — I10 Essential (primary) hypertension: Secondary | ICD-10-CM | POA: Diagnosis not present

## 2020-05-20 DIAGNOSIS — M24561 Contracture, right knee: Secondary | ICD-10-CM | POA: Diagnosis not present

## 2020-05-20 DIAGNOSIS — Z931 Gastrostomy status: Secondary | ICD-10-CM | POA: Diagnosis not present

## 2020-05-20 DIAGNOSIS — M24562 Contracture, left knee: Secondary | ICD-10-CM | POA: Diagnosis not present

## 2020-05-20 DIAGNOSIS — R4701 Aphasia: Secondary | ICD-10-CM | POA: Diagnosis not present

## 2020-05-20 DIAGNOSIS — G8191 Hemiplegia, unspecified affecting right dominant side: Secondary | ICD-10-CM | POA: Diagnosis not present

## 2020-05-20 DIAGNOSIS — I10 Essential (primary) hypertension: Secondary | ICD-10-CM | POA: Diagnosis not present

## 2020-05-28 NOTE — Progress Notes (Signed)
[facility] [address] [place]. [place] [address] [phone]       HOSPITAL FOLLOW UP NOTE  [name] Date of Birth:  October 24, 1981 Medical Record Number:  [id number]   Reason for Referral:  hospital stroke follow up    SUBJECTIVE:   CHIEF COMPLAINT:  Chief Complaint  Patient presents with  . Follow-up    hospital fu, with aid, treatment rm    HPI:   [name] is a 39 y.o. male with history of admission for Etoh poisoning presenting with a seizure and vomiting on 09/09/2019.  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  In addition, small focus of right basal ganglia ICH.  Hemorrhage likely from uncontrolled hypertension.  Cerebral edema s/p craniotomy and hematoma evacuation treated with 3% saline protocol.  Acute respiratory failure secondary to [place] requiring intubation.  Hypertensive emergency upon arrival without prior history or diagnosis of HTN with 2D echo showing severe LVH and initiated on amlodipine 10 mg daily and labetalol 300 mg 3 times daily.  1 prior history of seizure with seizure on presentation placed on Keppra 500 mg twice daily.  Alcohol abuse with recent hospitalization for alcohol poisoning drinks 1 pint of Henness[place] per day.  LDL 86 and recommended initiation of statin at discharge.  PEG placed 09/28/2019 for dysphagia post stroke.  Other stroke risk factors include anemia of critical illness, polysubstance abuse, AKI on CKD stage III, tachycardia and VAP vs aspiration pneumonia but no prior stroke history.  Residual deficits of global aphasia, left gaze preference, likely right hemianopia versus neglect, right facial droop and right hemiplegia.  Evaluated by therapies who recommended discharge to SNF for ongoing therapy needs.  Prior to discharge, evaluated by palliative care as patient continued to refuse to  participate in care and psychiatry deemed patient not competent to make decisions with mother present full CODE STATUS and need of long-term care.  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 In addition, small focus of R basal ganglia ICH.   NSG - decompressive craniectomy and hematoma evacuation w/ flap in R abd - 09/09/19 Dr Vertell Limber  Code Stroke CT Head - Large intraparenchymal hematoma of the left basal ganglia measuring 7.8 x 4.8 cm with 14 mm of rightward midline shift and early subfalcine herniation. Intraventricular extension of hemorrhage. Small focus of intraparenchymal hemorrhage in the right basal ganglia.   CT/CTA head - 09/10/2019 - Status post left pterional craniectomy for evacuation of left basal ganglia hematoma. Residual hematoma with interspersed postoperative gas measures 6.1 x 2.9 cm. Decreased midline shift, now measuring 6 mm to the right. No intracranial arterial occlusion or high-grade stenosis. No aneurysm or vascular malformation  Ct head 9/16 and 9/20 stable crani. Stable 5-6 midline shift. Decreased R basal ganglia hemorrhage   HgbA1c 5.6  LDL 86  TTE 09/16/19 EF 70 to 75%. There is severely increased left ventricular hypertrophy.  [employer] Virus 2 - negative  UDS - negatived  VTE prophylaxis - Heparin 5000 units sq tid   No antithrombotic prior to admission, now on No antithrombotic d/t hemorrhage   Therapy recommendations: SNF  Disposition:  SNF - d/c 05/02/20 - d/c barriers include uninsured and need of LTC  Today, 05/29/2020, Joshua [name] is being seen for hospital follow-up.  He  was recently discharged for hospital to SNF on 05/01/2020 due to difficulty with placement as he is uninsured and needs long-term care.  He currently resides at [facility] and accompanied today but facility staff. Patient remains globally aphasic, and right spastic hemiplegia.  Facility  staff present with patient is unsure if he is currently receiving therapy but apparently he is currently on a regular diet supplemented via PEG tube and occasionally refusing blood draw and medications.  Limited history intake due to aphasia.  Per review of MAR, he remains on Keppra 500 mg twice daily.  Amlodipine and metoprolol have been discontinued and was started on Catapres patch.  Blood pressure today initially elevated at 173/126 and on recheck 156/98. Per prior neurosurgery notes during admission on 12/01/2019, it was not recommended to replace skull flap at that time due to his current state requiring total care.  Flap replacement possible option if he becomes more mobile.  He remains nonambulatory at this time and unable to verify if there has been follow-up with neurosurgery since discharge.  Requested SNF send recent progress notes with most recent from 05/20/2020 by Nena Polio, NP.  Per review, he continues to refuse medications with staff attempting to administer via PEG tube.  Refused physical and speech therapy despite multiple attempts.  Blood pressure elevated rate using antihypertensives hence the reasoning of discontinuing oral antihypertensives and initiated clonidine patch.  Limited nutritional intake and refusal of tube feedings.  Noted if he continues to refuse nutrition, plans on having palliative care reconsult.      ROS:   Unable to obtain due to aphasia  PMH: No past medical history on file.  PSH:  Past Surgical History:  Procedure Laterality Date  . CRANIOTOMY Left 09/09/2019   Procedure: DECOMPRESSIVE CRANIECTOMY AND  HEMATOMA EVACUATION, IMPLANTATION OF SKULL FLAP IN RIGHT ABDOMEN;  Surgeon: Erline Levine, MD;  Location: [facility];  Service: Neurosurgery;  Laterality: Left;  . ESOPHAGOGASTRODUODENOSCOPY (EGD) WITH PROPOFOL N/A 09/28/2019   Procedure: ESOPHAGOGASTRODUODENOSCOPY (EGD) WITH PROPOFOL;  Surgeon: Georganna Skeans, MD;  Location: [facility] ENDOSCOPY;  Service:  General;  Laterality: N/A;  . PEG PLACEMENT N/A 09/28/2019   Procedure: PERCUTANEOUS ENDOSCOPIC GASTROSTOMY (PEG) PLACEMENT;  Surgeon: Georganna Skeans, MD;  Location: [facility] ENDOSCOPY;  Service: General;  Laterality: N/A;    Social History:  Social History   Socioeconomic History  . Marital status: Single    Spouse name: Not on file  . Number of children: Not on file  . Years of education: Not on file  . Highest education level: Not on file  Occupational History  . Not on file  Tobacco Use  . Smoking status: Former Research scientist (life sciences)  . Smokeless tobacco: Former Network engineer and Sexual Activity  . Alcohol use: Yes  . Drug use: Not Currently  . Sexual activity: Not Currently  Other Topics Concern  . Not on file  Social History Narrative  . Not on file   Social Determinants of Health   Financial Resource Strain: Unknown  . Difficulty of Paying Living Expenses: Patient refused  Food Insecurity: Unknown  . Worried About Charity fundraiser in the Last Year: Patient refused  . Ran Out of Food in the Last Year: Patient refused  Transportation Needs: Unknown  . Lack of Transportation (Medical): Patient refused  . Lack of Transportation (Non-Medical): Patient refused  Physical Activity: Unknown  . Days of Exercise per Week: Patient refused  . Minutes of Exercise per Session: Patient refused  Stress: Unknown  .  Feeling of Stress : Patient refused  Social Connections: Unknown  . Frequency of Communication with Friends and Family: Patient refused  . Frequency of Social Gatherings with Friends and Family: Patient refused  . Attends Religious Services: Patient refused  . Active Member of Clubs or Organizations: Patient refused  . Attends Archivist Meetings: Patient refused  . Marital Status: Patient refused  Intimate Partner Violence: Unknown  . Fear of Current or Ex-Partner: Patient refused  . Emotionally Abused: Patient refused  . Physically Abused: Patient refused  . Sexually  Abused: Patient refused    Family History: No family history on file.  Medications:   Current Outpatient Medications on File Prior to Visit  Medication Sig Dispense Refill  . acetaminophen (TYLENOL) 325 MG tablet Place 2 tablets (650 mg total) into feeding tube every 4 (four) hours as needed for mild pain (temp > 100.5).    [name] Amino Acids-Protein Hydrolys (FEEDING SUPPLEMENT, PRO-STAT SUGAR FREE 64,) LIQD Place 30 mLs into feeding tube 4 (four) times daily. 887 mL 0  . levETIRAcetam (KEPPRA) 100 MG/ML solution Place 5 mLs (500 mg total) into feeding tube 2 (two) times daily. 473 mL 12  . Nutritional Supplements (FEEDING SUPPLEMENT, JEVITY 1.2 CAL,) LIQD Place 474 mLs into feeding tube in the morning, at noon, in the evening, and at bedtime.  0  . polyethylene glycol (MIRALAX / GLYCOLAX) 17 g packet Place 17 g into feeding tube 2 (two) times daily. 14 each 0  . Water For Irrigation, Sterile (FREE WATER) SOLN Place 200 mLs into feeding tube every 4 (four) hours.     No current facility-administered medications on file prior to visit.    Allergies:  No Known Allergies    OBJECTIVE:  Physical Exam  Vitals:   05/29/20 1114 05/29/20 1119 05/29/20 1145  BP: (!) 173/126 (!) 178/120 (!) 156/98  Pulse: (!) 106    Height: 5\' 5"  (1.651 m)     Body mass index is 23.11 kg/m. No exam data present  PHQ 2-9 post stroke Unable to obtain due to aphasia  General: well developed, well nourished,  middle-aged African-American male, seated, in no evident distress Head: head normocephalic and atraumatic.   Neck: supple with no carotid or supraclavicular bruits Cardiovascular: regular rate and rhythm, no murmurs Musculoskeletal: no deformity Skin:  no rash/petichiae Vascular:  Normal pulses all extremities   Neurologic Exam Mental Status: Awake and fully alert.   Global aphasia, nonverbal.  Unable to follow simple commands even with demonstration.  Able to shake head "no" but not consistently  appropriate.  Mood and affect appropriate.  Cranial Nerves: Fundoscopic exam unable to perform due to patient difficulty understanding.  Pupils equal, briskly reactive to light. Extraocular movements full without nystagmus. Visual fields blink to threat in all 4 quadrants. Hearing intact. Facial sensation intact.  Right facial droop. Motor: Spontaneous movement LUE and LLE.  Difficulty testing right side but did not demonstrate any movement of RUE and will use left hand to move RLE with painful stimuli.  RUE no response to painful stimuli Sensory.:  Unable to adequately test Coordination: Unable to adequately test Gait and Station: Deferred as patient nonambulatory Reflexes: 3+ RUE and RLE ; 1+ LUE and LLE.      NIHSS 18 1a.  Level of consciousness 0 1b. LOC questions 2 1c. LOC commands 2 2.  Best gaze 0 3.  Visual 0 4.  Facial palsy 2 5a.  Motor arm-left 4 5b.  Motor arm-right 0  6a.  Motor leg-left 3 6b.  Motor leg-right 0 7.  Limb ataxia 0 8.  Sensory 0 9.  Best language 3 10.  Dysarthria 2 11.  Extinction and inattention 0  Modified Rankin  5      ASSESSMENT/PLAN: Joshua [name] is a 39 y.o. 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 Vascular risk factors include uncontrolled HTN, HLD, seizure, alcohol abuse and polysubstance abuse.  Residual deficits of spastic right hemiplegia and global aphasia with no verbal output.  Limited assessment and HPI due to global aphasia.  Requested SNF send recent progress notes with additional information provided in HPI.       1. Left BG ICH s/p crani and hematoma evac:  -Significant residual deficits without improvement as he continues to refuse therapies.  He will likely not have much improvement or  return to a functional level due to barriers with therapy with refusing to take medications as well as limited nutritional intake -May benefit from baclofen for right-sided spasticity poststroke but deferred to facility for further determination -skull flap remains RLQ -neurosurgery does not plan on replacing unless he has functional improvement per review of hospital note -Maintain strict control of hypertension with blood pressure goal below 130/90, diabetes with hemoglobin A1c goal below 6.5% and cholesterol with LDL cholesterol (bad cholesterol) goal below 70 mg/dL.  I also advised the patient to eat a healthy diet with plenty of whole grains, cereals, fruits and vegetables, exercise regularly with at least 30 minutes of continuous activity daily and maintain ideal body weight. 2. Seizure, poststroke: Continuation of Keppra 500 mg twice daily for seizure prophylaxis 3. HTN: Elevated at today's visit and recommend ongoing monitoring via facility and adjust meds as needed    Advised to follow-up in office as needed as ongoing appointments would not be beneficial as patient cannot participate and being closely monitored by facility.  Advised to not hesitate with any questions or concerns going forward.   I spent 45 minutes of face-to-face and non-face-to-face time with patient.  This included previsit chart review, lab review, study review, order entry, electronic health record documentation, as well as review of facility progress notes.  Unable to provide education due to global aphasia.     Frann Rider, AGNP-BC  [facility] [address] [place], [place] [address]  Phone [phone] Fax [phone] Note: This document was prepared with digital dictation and possible smart phrase technology. Any transcriptional errors that result from this process are unintentional.

## 2020-05-29 ENCOUNTER — Ambulatory Visit (INDEPENDENT_AMBULATORY_CARE_PROVIDER_SITE_OTHER): Payer: Medicaid Other | Admitting: Adult Health

## 2020-05-29 ENCOUNTER — Other Ambulatory Visit: Payer: Self-pay

## 2020-05-29 ENCOUNTER — Encounter: Payer: Self-pay | Admitting: Adult Health

## 2020-05-29 VITALS — BP 156/98 | HR 106 | Ht 65.0 in

## 2020-05-29 DIAGNOSIS — I69391 Dysphagia following cerebral infarction: Secondary | ICD-10-CM

## 2020-05-29 DIAGNOSIS — Z79899 Other long term (current) drug therapy: Secondary | ICD-10-CM | POA: Diagnosis not present

## 2020-05-29 DIAGNOSIS — I69151 Hemiplegia and hemiparesis following nontraumatic intracerebral hemorrhage affecting right dominant side: Secondary | ICD-10-CM

## 2020-05-29 DIAGNOSIS — I10 Essential (primary) hypertension: Secondary | ICD-10-CM | POA: Diagnosis not present

## 2020-05-29 DIAGNOSIS — R569 Unspecified convulsions: Secondary | ICD-10-CM

## 2020-05-29 DIAGNOSIS — I611 Nontraumatic intracerebral hemorrhage in hemisphere, cortical: Secondary | ICD-10-CM

## 2020-05-29 DIAGNOSIS — I69320 Aphasia following cerebral infarction: Secondary | ICD-10-CM | POA: Diagnosis not present

## 2020-05-29 DIAGNOSIS — Z9889 Other specified postprocedural states: Secondary | ICD-10-CM | POA: Diagnosis not present

## 2020-05-29 DIAGNOSIS — I69359 Hemiplegia and hemiparesis following cerebral infarction affecting unspecified side: Secondary | ICD-10-CM | POA: Diagnosis not present

## 2020-05-29 NOTE — Patient Instructions (Signed)
Ensure working with therapies for ongoing deficits and hopeful improvement  Recommend trialing use of muscle relaxor to spasticity post stroke such as baclofen  Blood pressure elevated today - continue to monitor at facility and manage   Ensure follow up with neurosurgery for routine follow up  Requested additional paperwork be faxed to office for further information   Unable to provide much assistance or benefit with routine visits - recommend follow up as needed for right now but please do not hesitate with any questions or concerns      Thank you for coming to see [place] at [facility]. I hope we have been able to provide you high quality care today.  You may receive a patient satisfaction survey over the next few weeks. We would appreciate your feedback and comments so that we may continue to improve ourselves and the health of our patients.

## 2020-05-30 NOTE — Progress Notes (Signed)
I agree with the above plan 

## 2020-06-03 DIAGNOSIS — G8111 Spastic hemiplegia affecting right dominant side: Secondary | ICD-10-CM | POA: Diagnosis not present

## 2020-06-03 DIAGNOSIS — I619 Nontraumatic intracerebral hemorrhage, unspecified: Secondary | ICD-10-CM | POA: Diagnosis not present

## 2020-06-03 DIAGNOSIS — I10 Essential (primary) hypertension: Secondary | ICD-10-CM | POA: Diagnosis not present

## 2020-06-10 DIAGNOSIS — Z931 Gastrostomy status: Secondary | ICD-10-CM | POA: Diagnosis not present

## 2020-06-10 DIAGNOSIS — G8111 Spastic hemiplegia affecting right dominant side: Secondary | ICD-10-CM | POA: Diagnosis not present

## 2020-06-10 DIAGNOSIS — I618 Other nontraumatic intracerebral hemorrhage: Secondary | ICD-10-CM | POA: Diagnosis not present

## 2020-06-10 DIAGNOSIS — R4701 Aphasia: Secondary | ICD-10-CM | POA: Diagnosis not present

## 2020-06-10 DIAGNOSIS — G40909 Epilepsy, unspecified, not intractable, without status epilepticus: Secondary | ICD-10-CM | POA: Diagnosis not present

## 2020-06-10 DIAGNOSIS — I10 Essential (primary) hypertension: Secondary | ICD-10-CM | POA: Diagnosis not present

## 2020-06-17 DIAGNOSIS — Z20822 Contact with and (suspected) exposure to covid-19: Secondary | ICD-10-CM | POA: Diagnosis not present

## 2020-06-22 IMAGING — CT CT HEAD W/O CM
4 series · 16 of 47 positions shown, 18 images · non-contrast
Comparison: Head CT 09/13/2019

CLINICAL DATA: Intracranial hemorrhage follow up

EXAM:
CT HEAD WITHOUT CONTRAST
TECHNIQUE: Contiguous axial images were obtained from the base of the skull
through the vertex without intravenous contrast.

[Series 3: head wo · axial · 0.44mm/px · z∈[-163,-28]mm · 7 of 37 slices shown, 9 images]
[im 5/37  brain]
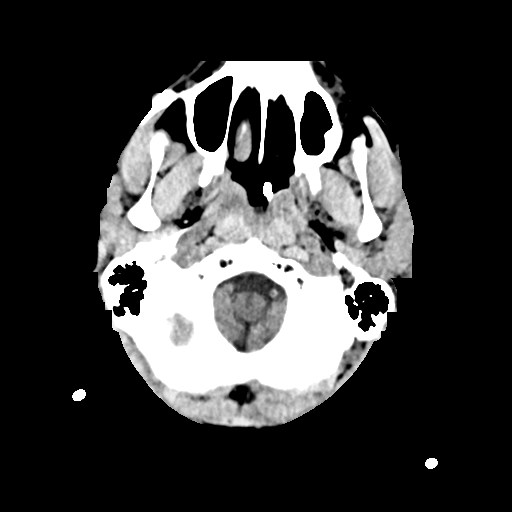
[im 5/37  bone]
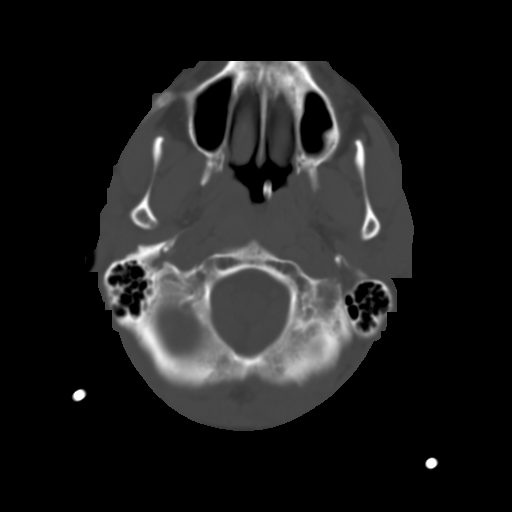
[im 10/37  brain]
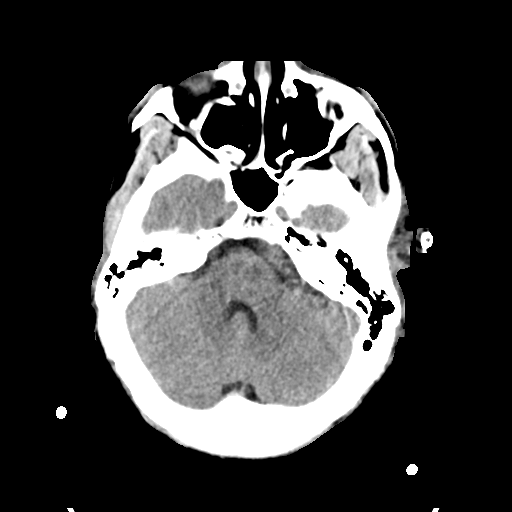
[im 14/37  brain]
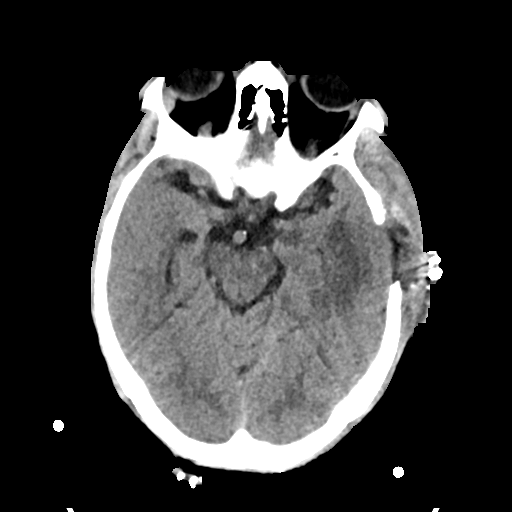
[im 19/37  brain]
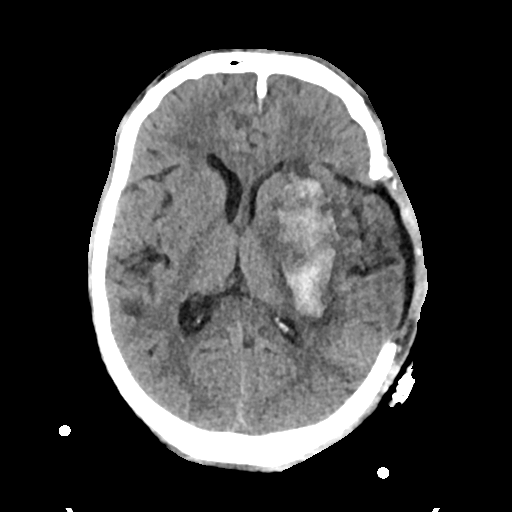
[im 23/37  brain]
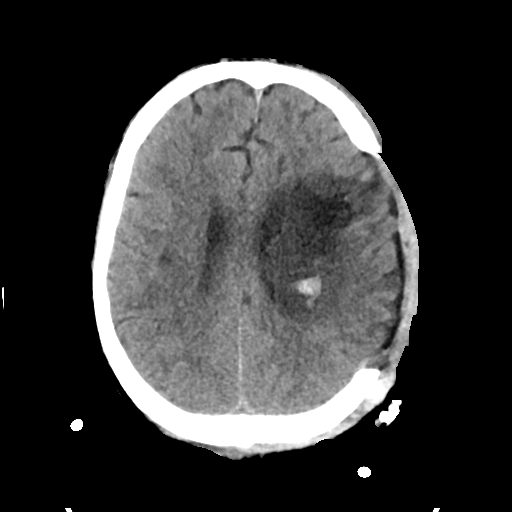
[im 23/37  bone]
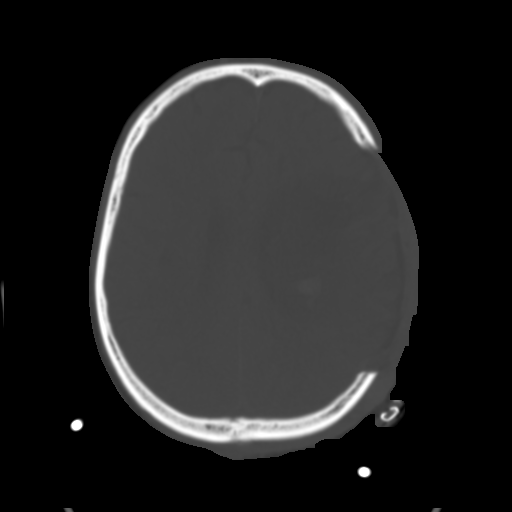
[im 28/37  brain]
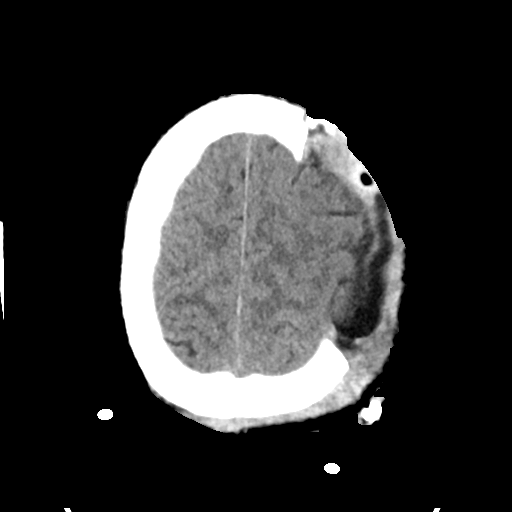
[im 32/37  brain]
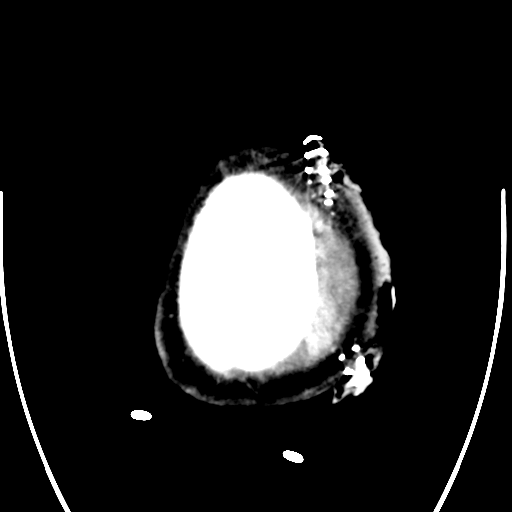

[Series 4: head bone · axial · 0.44mm/px · z∈[-165,-129]mm · 3 of 92 slices shown]
[im 10/92  bone]
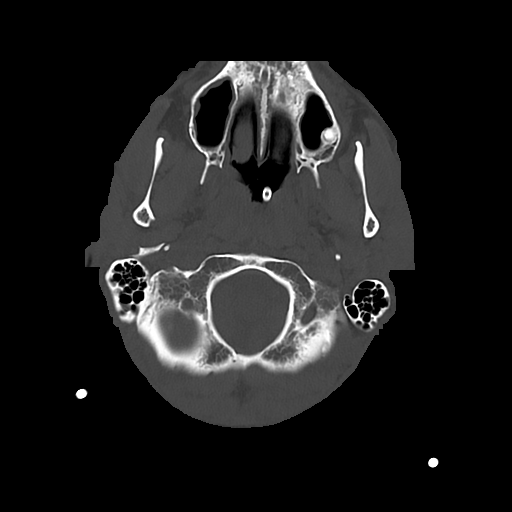
[im 19/92  bone]
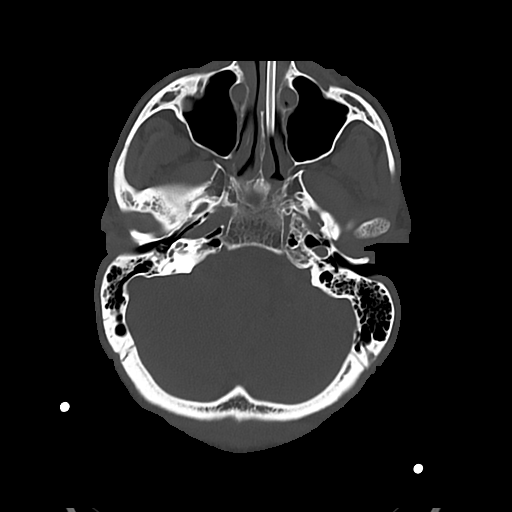
[im 28/92  bone]
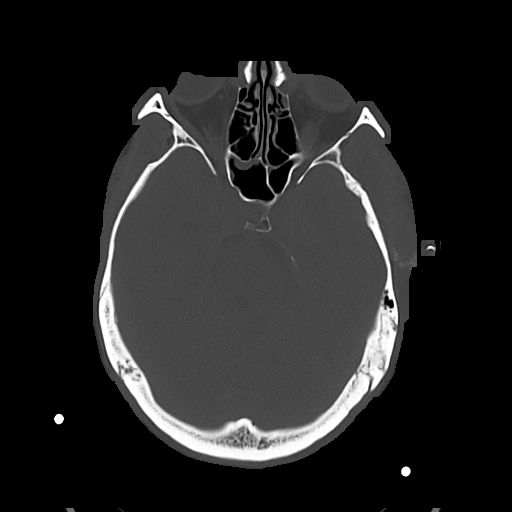

[Series 5: cor soft · coronal · 0.38mm/px · 3 of 76 slices shown]
[im 26/76  brain]
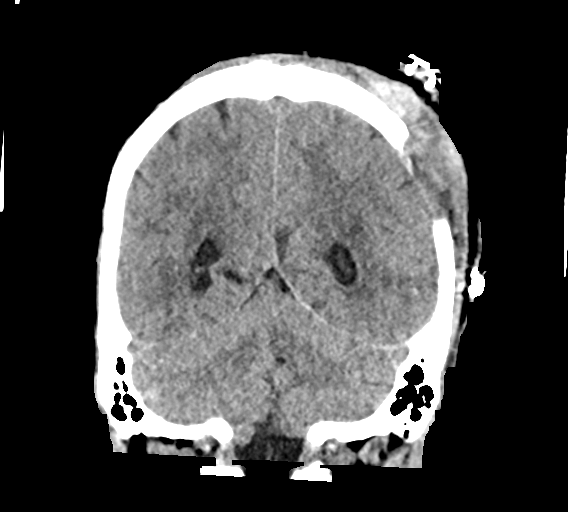
[im 34/76  brain]
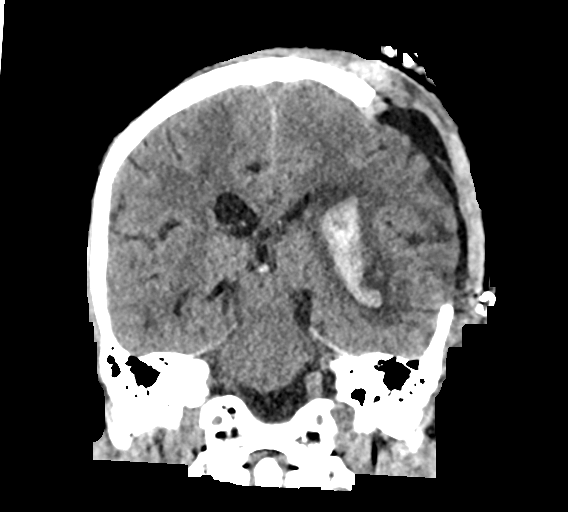
[im 42/76  brain]
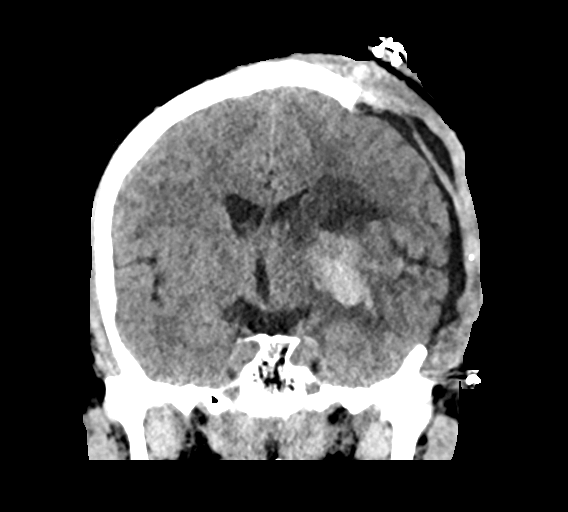

[Series 6: sag soft · sagittal · 0.38mm/px · 3 of 62 slices shown]
[im 21/62  brain]
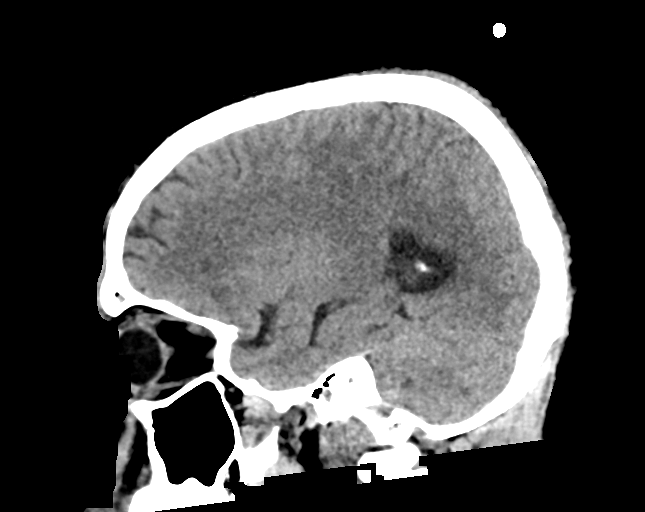
[im 31/62  brain]
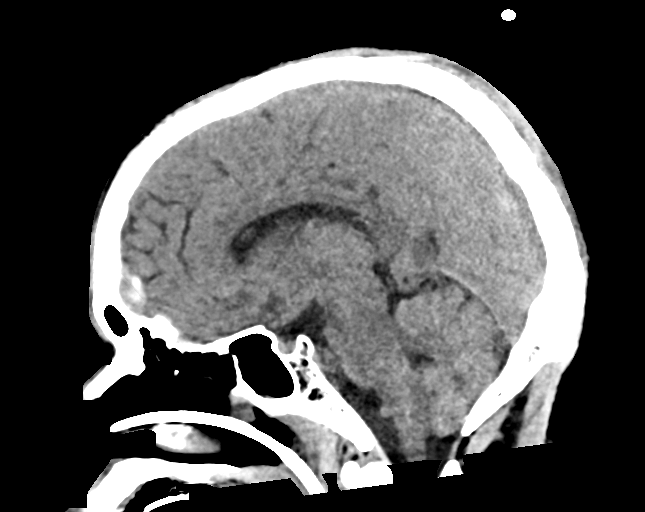
[im 41/62  brain]
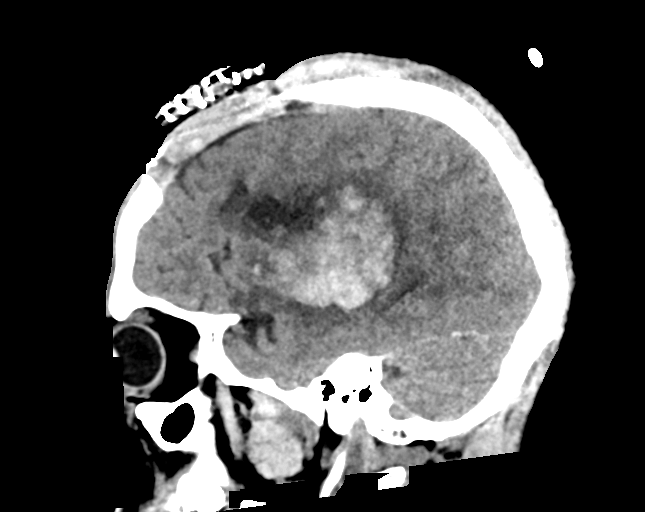

[16 of 47 positions shown; findings below may reference images not displayed]

FINDINGS: Brain: The size of the intraparenchymal hematoma centered in the
left basal ganglia is unchanged. Surrounding edema and postsurgical
changes also remain the same. There is a small amount of blood
within both occipital horns. Rightward midline shift measures 6 mm
at the level of the foramina Aujla, unchanged. No new site of
hemorrhage.

Vascular: No abnormal hyperdensity of the major intracranial
arteries or dural venous sinuses. No intracranial atherosclerosis.

Skull: Left pterional craniectomy with soft tissue swelling
overlying.

Sinuses/Orbits: No fluid levels or advanced mucosal thickening of
the visualized paranasal sinuses. No mastoid or middle ear effusion.
The orbits are normal.
IMPRESSION: 1. Unchanged size of intraparenchymal hematoma centered in the left
basal ganglia with unchanged surrounding edema and mass effect.
2. Unchanged rightward midline shift, measuring 6 mm.

## 2020-06-23 ENCOUNTER — Emergency Department (HOSPITAL_COMMUNITY): Payer: Medicaid Other

## 2020-06-23 ENCOUNTER — Inpatient Hospital Stay (HOSPITAL_COMMUNITY)
Admission: AD | Admit: 2020-06-23 | Discharge: 2020-07-05 | DRG: 082 | Disposition: A | Discharge status: Nursing Home (Includes ICF) | Payer: Medicaid Other | Source: Skilled Nursing Facility | Attending: Internal Medicine | Admitting: Internal Medicine

## 2020-06-23 ENCOUNTER — Encounter (HOSPITAL_COMMUNITY): Payer: Self-pay | Admitting: Emergency Medicine

## 2020-06-23 DIAGNOSIS — J690 Pneumonitis due to inhalation of food and vomit: Secondary | ICD-10-CM | POA: Diagnosis not present

## 2020-06-23 DIAGNOSIS — I129 Hypertensive chronic kidney disease with stage 1 through stage 4 chronic kidney disease, or unspecified chronic kidney disease: Secondary | ICD-10-CM | POA: Diagnosis present

## 2020-06-23 DIAGNOSIS — Z931 Gastrostomy status: Secondary | ICD-10-CM | POA: Diagnosis not present

## 2020-06-23 DIAGNOSIS — Z9114 Patient's other noncompliance with medication regimen: Secondary | ICD-10-CM | POA: Diagnosis not present

## 2020-06-23 DIAGNOSIS — G40901 Epilepsy, unspecified, not intractable, with status epilepticus: Secondary | ICD-10-CM | POA: Diagnosis present

## 2020-06-23 DIAGNOSIS — I160 Hypertensive urgency: Secondary | ICD-10-CM | POA: Diagnosis present

## 2020-06-23 DIAGNOSIS — I629 Nontraumatic intracranial hemorrhage, unspecified: Secondary | ICD-10-CM

## 2020-06-23 DIAGNOSIS — N183 Chronic kidney disease, stage 3 unspecified: Secondary | ICD-10-CM | POA: Diagnosis present

## 2020-06-23 DIAGNOSIS — R404 Transient alteration of awareness: Secondary | ICD-10-CM | POA: Diagnosis not present

## 2020-06-23 DIAGNOSIS — R64 Cachexia: Secondary | ICD-10-CM | POA: Diagnosis present

## 2020-06-23 DIAGNOSIS — N179 Acute kidney failure, unspecified: Secondary | ICD-10-CM | POA: Diagnosis present

## 2020-06-23 DIAGNOSIS — J3489 Other specified disorders of nose and nasal sinuses: Secondary | ICD-10-CM | POA: Diagnosis not present

## 2020-06-23 DIAGNOSIS — J9690 Respiratory failure, unspecified, unspecified whether with hypoxia or hypercapnia: Secondary | ICD-10-CM | POA: Diagnosis not present

## 2020-06-23 DIAGNOSIS — Z87891 Personal history of nicotine dependence: Secondary | ICD-10-CM | POA: Diagnosis not present

## 2020-06-23 DIAGNOSIS — Z20822 Contact with and (suspected) exposure to covid-19: Secondary | ICD-10-CM | POA: Diagnosis present

## 2020-06-23 DIAGNOSIS — E872 Acidosis: Secondary | ICD-10-CM | POA: Diagnosis present

## 2020-06-23 DIAGNOSIS — R739 Hyperglycemia, unspecified: Secondary | ICD-10-CM | POA: Diagnosis present

## 2020-06-23 DIAGNOSIS — E860 Dehydration: Secondary | ICD-10-CM | POA: Diagnosis present

## 2020-06-23 DIAGNOSIS — S06309A Unspecified focal traumatic brain injury with loss of consciousness of unspecified duration, initial encounter: Principal | ICD-10-CM | POA: Diagnosis present

## 2020-06-23 DIAGNOSIS — I499 Cardiac arrhythmia, unspecified: Secondary | ICD-10-CM | POA: Diagnosis not present

## 2020-06-23 DIAGNOSIS — I69220 Aphasia following other nontraumatic intracranial hemorrhage: Secondary | ICD-10-CM

## 2020-06-23 DIAGNOSIS — R0902 Hypoxemia: Secondary | ICD-10-CM | POA: Diagnosis not present

## 2020-06-23 DIAGNOSIS — E876 Hypokalemia: Secondary | ICD-10-CM | POA: Diagnosis present

## 2020-06-23 DIAGNOSIS — R569 Unspecified convulsions: Secondary | ICD-10-CM

## 2020-06-23 DIAGNOSIS — R4701 Aphasia: Secondary | ICD-10-CM | POA: Diagnosis present

## 2020-06-23 DIAGNOSIS — M6250 Muscle wasting and atrophy, not elsewhere classified, unspecified site: Secondary | ICD-10-CM | POA: Diagnosis present

## 2020-06-23 DIAGNOSIS — J9811 Atelectasis: Secondary | ICD-10-CM | POA: Diagnosis not present

## 2020-06-23 DIAGNOSIS — W19XXXA Unspecified fall, initial encounter: Secondary | ICD-10-CM | POA: Diagnosis present

## 2020-06-23 DIAGNOSIS — G40909 Epilepsy, unspecified, not intractable, without status epilepticus: Secondary | ICD-10-CM | POA: Diagnosis not present

## 2020-06-23 DIAGNOSIS — J9601 Acute respiratory failure with hypoxia: Secondary | ICD-10-CM | POA: Diagnosis present

## 2020-06-23 DIAGNOSIS — I69251 Hemiplegia and hemiparesis following other nontraumatic intracranial hemorrhage affecting right dominant side: Secondary | ICD-10-CM

## 2020-06-23 DIAGNOSIS — I10 Essential (primary) hypertension: Secondary | ICD-10-CM | POA: Diagnosis not present

## 2020-06-23 DIAGNOSIS — N184 Chronic kidney disease, stage 4 (severe): Secondary | ICD-10-CM | POA: Diagnosis not present

## 2020-06-23 DIAGNOSIS — Z7401 Bed confinement status: Secondary | ICD-10-CM | POA: Diagnosis not present

## 2020-06-23 DIAGNOSIS — G8191 Hemiplegia, unspecified affecting right dominant side: Secondary | ICD-10-CM | POA: Diagnosis not present

## 2020-06-23 DIAGNOSIS — R7303 Prediabetes: Secondary | ICD-10-CM | POA: Diagnosis present

## 2020-06-23 DIAGNOSIS — Y92129 Unspecified place in nursing home as the place of occurrence of the external cause: Secondary | ICD-10-CM

## 2020-06-23 DIAGNOSIS — Z79899 Other long term (current) drug therapy: Secondary | ICD-10-CM | POA: Diagnosis not present

## 2020-06-23 DIAGNOSIS — S06360A Traumatic hemorrhage of cerebrum, unspecified, without loss of consciousness, initial encounter: Secondary | ICD-10-CM | POA: Diagnosis not present

## 2020-06-23 DIAGNOSIS — R000 Tachycardia, unspecified: Secondary | ICD-10-CM | POA: Diagnosis not present

## 2020-06-23 DIAGNOSIS — E43 Unspecified severe protein-calorie malnutrition: Secondary | ICD-10-CM | POA: Diagnosis not present

## 2020-06-23 DIAGNOSIS — I616 Nontraumatic intracerebral hemorrhage, multiple localized: Secondary | ICD-10-CM | POA: Diagnosis not present

## 2020-06-23 DIAGNOSIS — G40911 Epilepsy, unspecified, intractable, with status epilepticus: Secondary | ICD-10-CM | POA: Diagnosis not present

## 2020-06-23 DIAGNOSIS — Z4682 Encounter for fitting and adjustment of non-vascular catheter: Secondary | ICD-10-CM | POA: Diagnosis not present

## 2020-06-23 DIAGNOSIS — G9389 Other specified disorders of brain: Secondary | ICD-10-CM | POA: Diagnosis not present

## 2020-06-23 DIAGNOSIS — L89610 Pressure ulcer of right heel, unstageable: Secondary | ICD-10-CM | POA: Diagnosis present

## 2020-06-23 DIAGNOSIS — J9602 Acute respiratory failure with hypercapnia: Secondary | ICD-10-CM | POA: Diagnosis present

## 2020-06-23 DIAGNOSIS — R269 Unspecified abnormalities of gait and mobility: Secondary | ICD-10-CM | POA: Diagnosis not present

## 2020-06-23 DIAGNOSIS — Z6823 Body mass index (BMI) 23.0-23.9, adult: Secondary | ICD-10-CM

## 2020-06-23 DIAGNOSIS — I619 Nontraumatic intracerebral hemorrhage, unspecified: Secondary | ICD-10-CM | POA: Diagnosis not present

## 2020-06-23 LAB — I-STAT ARTERIAL BLOOD GAS, ED
Acid-base deficit: 4 mmol/L — ABNORMAL HIGH (ref 0.0–2.0)
Bicarbonate: 22.2 mmol/L (ref 20.0–28.0)
Calcium, Ion: 1.3 mmol/L (ref 1.15–1.40)
HCT: 47 % (ref 39.0–52.0)
Hemoglobin: 16 g/dL (ref 13.0–17.0)
O2 Saturation: 94 %
Patient temperature: 98.6
Potassium: 2.7 mmol/L — CL (ref 3.5–5.1)
Sodium: 141 mmol/L (ref 135–145)
TCO2: 24 mmol/L (ref 22–32)
pCO2 arterial: 45.1 mmHg (ref 32.0–48.0)
pH, Arterial: 7.3 — ABNORMAL LOW (ref 7.350–7.450)
pO2, Arterial: 76 mmHg — ABNORMAL LOW (ref 83.0–108.0)

## 2020-06-23 LAB — COMPREHENSIVE METABOLIC PANEL
ALT: 9 U/L (ref 0–44)
AST: 20 U/L (ref 15–41)
Albumin: 3.1 g/dL — ABNORMAL LOW (ref 3.5–5.0)
Alkaline Phosphatase: 103 U/L (ref 38–126)
Anion gap: 14 (ref 5–15)
BUN: 17 mg/dL (ref 6–20)
CO2: 20 mmol/L — ABNORMAL LOW (ref 22–32)
Calcium: 8.5 mg/dL — ABNORMAL LOW (ref 8.9–10.3)
Chloride: 105 mmol/L (ref 98–111)
Creatinine, Ser: 1.65 mg/dL — ABNORMAL HIGH (ref 0.61–1.24)
GFR calc Af Amer: 59 mL/min — ABNORMAL LOW (ref >60)
GFR calc non Af Amer: 51 mL/min — ABNORMAL LOW (ref >60)
Glucose, Bld: 189 mg/dL — ABNORMAL HIGH (ref 70–99)
Potassium: 2.8 mmol/L — ABNORMAL LOW (ref 3.5–5.1)
Sodium: 139 mmol/L (ref 135–145)
Total Bilirubin: 0.4 mg/dL (ref 0.3–1.2)
Total Protein: 6.7 g/dL (ref 6.5–8.1)

## 2020-06-23 LAB — CBC
HCT: 43.9 % (ref 39.0–52.0)
Hemoglobin: 13.1 g/dL (ref 13.0–17.0)
MCH: 25.1 pg — ABNORMAL LOW (ref 26.0–34.0)
MCHC: 29.8 g/dL — ABNORMAL LOW (ref 30.0–36.0)
MCV: 84.1 fL (ref 80.0–100.0)
Platelets: 243 10*3/uL (ref 150–400)
RBC: 5.22 MIL/uL (ref 4.22–5.81)
RDW: 15.9 % — ABNORMAL HIGH (ref 11.5–15.5)
WBC: 13.4 10*3/uL — ABNORMAL HIGH (ref 4.0–10.5)
nRBC: 0 % (ref 0.0–0.2)

## 2020-06-23 LAB — LACTIC ACID, PLASMA: Lactic Acid, Venous: 7.3 mmol/L (ref 0.5–1.9)

## 2020-06-23 LAB — URINALYSIS, ROUTINE W REFLEX MICROSCOPIC
Bacteria, UA: NONE SEEN
Bilirubin Urine: NEGATIVE
Glucose, UA: >=500 mg/dL — AB
Ketones, ur: NEGATIVE mg/dL
Leukocytes,Ua: NEGATIVE
Nitrite: NEGATIVE
Protein, ur: >=300 mg/dL — AB
Specific Gravity, Urine: 1.01 (ref 1.005–1.030)
pH: 5 (ref 5.0–8.0)

## 2020-06-23 LAB — SARS CORONAVIRUS 2 BY RT PCR (HOSPITAL ORDER, PERFORMED IN ~~LOC~~ HOSPITAL LAB): SARS Coronavirus 2: NEGATIVE

## 2020-06-23 LAB — TRIGLYCERIDES: Triglycerides: 156 mg/dL — ABNORMAL HIGH (ref <150)

## 2020-06-23 LAB — CK: Total CK: 90 U/L (ref 49–397)

## 2020-06-23 IMAGING — DX DG CHEST 1V PORT
1 series · 1 of 1 positions shown · non-contrast
Comparison: 09/17/2019.

CLINICAL DATA: Intubation.  Seizure.  Head injury.

EXAM:
PORTABLE CHEST 1 VIEW

[chest]
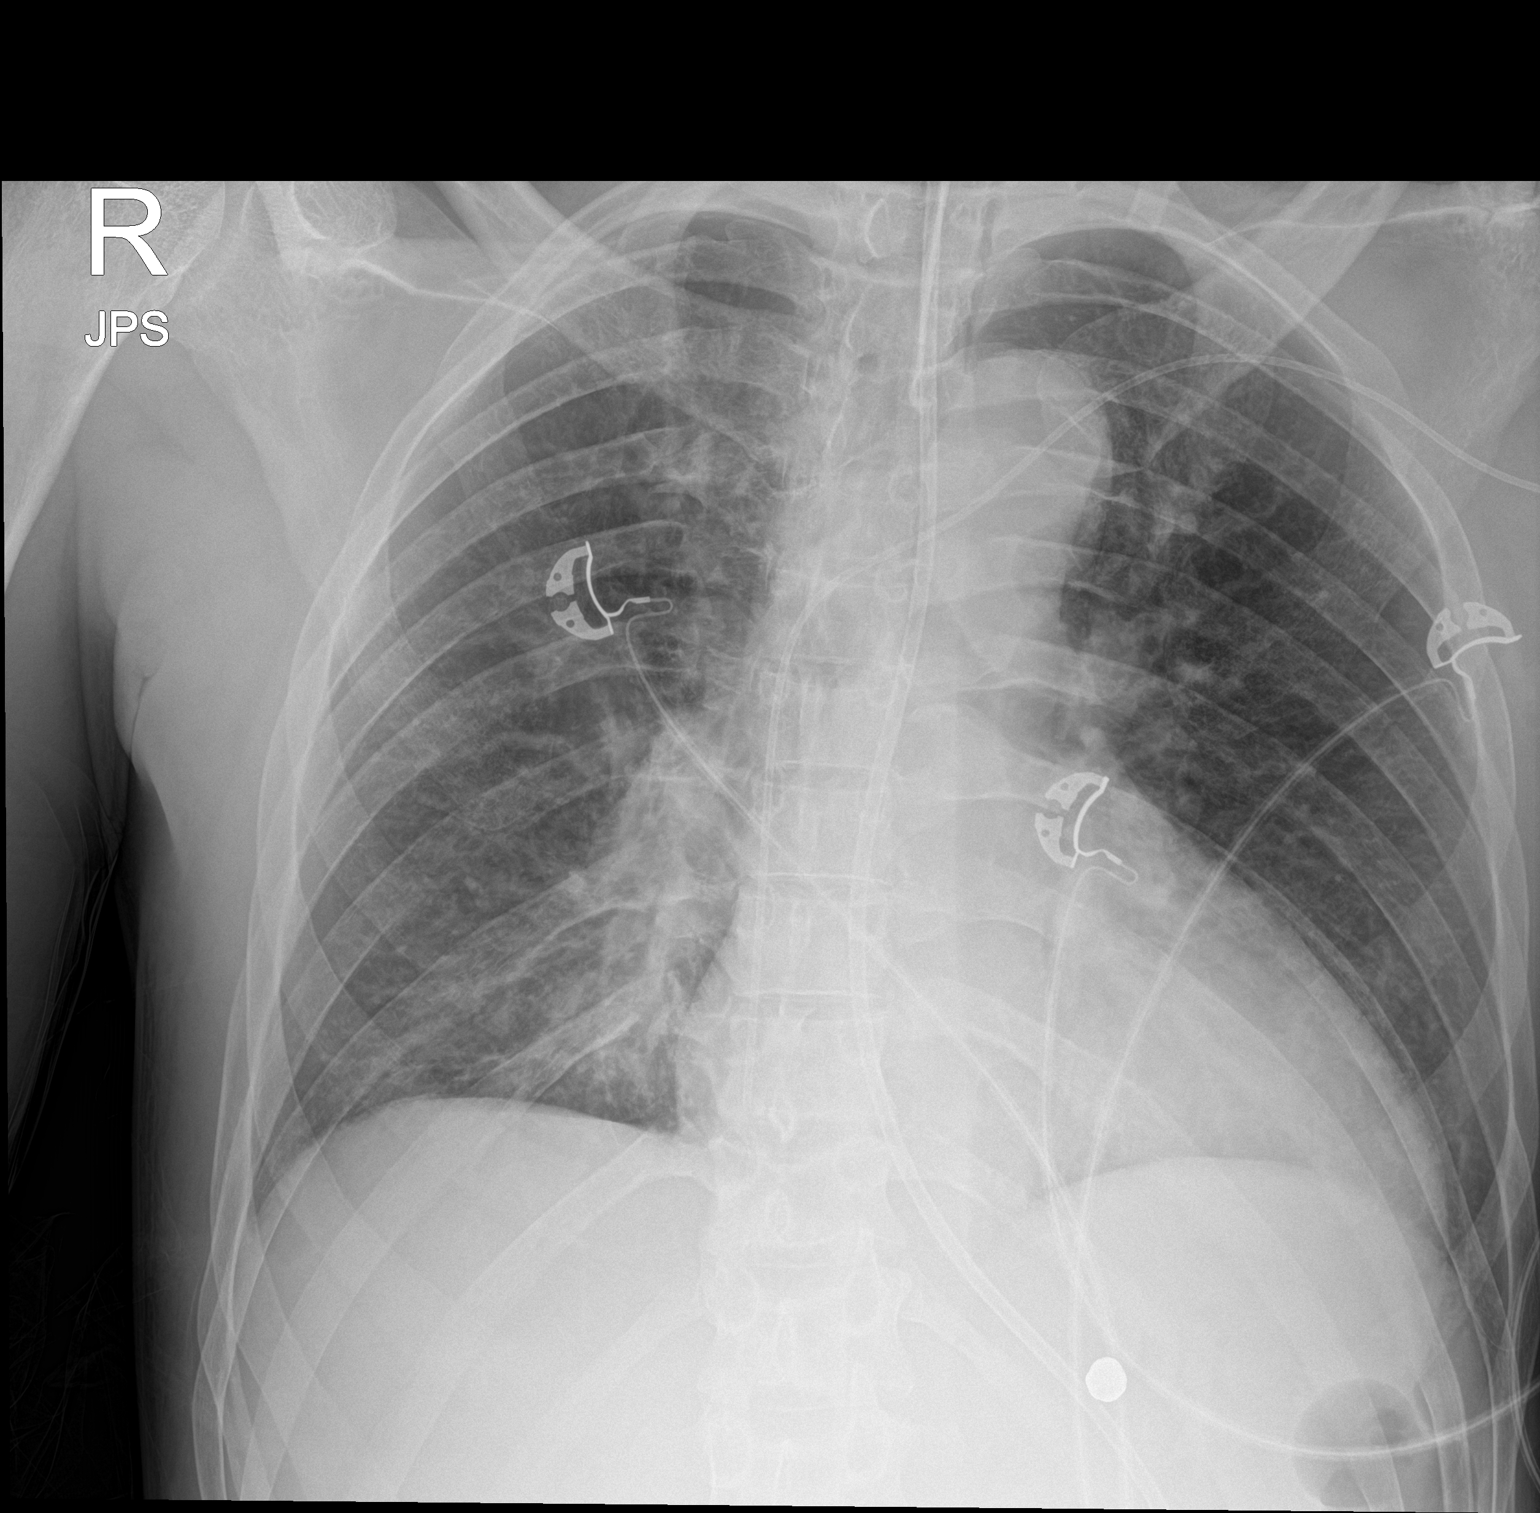

[1 of 1 positions shown; findings below may reference images not displayed]

FINDINGS: Endotracheal tube, feeding tube in stable position position. PICC
line tip noted over the right atrium. Stable cardiomegaly. Low lung
volumes with right base atelectasis/infiltrate. No pleural effusion
or pneumothorax.
IMPRESSION: 1. Endotracheal tube, feeding tube in stable position. PICC line tip
noted over the right atrium.

2.  Stable cardiomegaly.

3.  Low lung volumes with right base atelectasis/infiltrate.

## 2020-06-23 MED ORDER — MAGNESIUM SULFATE 2 GM/50ML IV SOLN
2.0000 g | INTRAVENOUS | Status: AC
  Administered 2020-06-24: 2 g via INTRAVENOUS
  Filled 2020-06-23: qty 50

## 2020-06-23 MED ORDER — VANCOMYCIN HCL IN DEXTROSE 1-5 GM/200ML-% IV SOLN
1000.0000 mg | Freq: Once | INTRAVENOUS | Status: AC
  Administered 2020-06-23: 1000 mg via INTRAVENOUS
  Filled 2020-06-23: qty 200

## 2020-06-23 MED ORDER — NICARDIPINE HCL IN NACL 20-0.86 MG/200ML-% IV SOLN
3.0000 mg/h | INTRAVENOUS | Status: DC
  Administered 2020-06-23: 5 mg/h via INTRAVENOUS
  Administered 2020-06-24: 7.5 mg/h via INTRAVENOUS
  Filled 2020-06-23 (×2): qty 200

## 2020-06-23 MED ORDER — ETOMIDATE 2 MG/ML IV SOLN
INTRAVENOUS | Status: AC | PRN
  Administered 2020-06-23: 20 mg via INTRAVENOUS

## 2020-06-23 MED ORDER — LORAZEPAM 2 MG/ML IJ SOLN: 1.0000 mg | Freq: Once | INTRAMUSCULAR | Status: DC

## 2020-06-23 MED ORDER — DOCUSATE SODIUM 100 MG PO CAPS: 100.0000 mg | ORAL_CAPSULE | Freq: Two times a day (BID) | ORAL | Status: DC | PRN

## 2020-06-23 MED ORDER — PANTOPRAZOLE SODIUM 40 MG IV SOLR
40.0000 mg | Freq: Every day | INTRAVENOUS | Status: DC
  Administered 2020-06-24 – 2020-06-26 (×4): 40 mg via INTRAVENOUS
  Filled 2020-06-23 (×4): qty 40

## 2020-06-23 MED ORDER — INSULIN ASPART 100 UNIT/ML ~~LOC~~ SOLN
0.0000 [IU] | SUBCUTANEOUS | Status: DC
  Administered 2020-06-24: 2 [IU] via SUBCUTANEOUS
  Administered 2020-06-26 – 2020-06-28 (×8): 1 [IU] via SUBCUTANEOUS

## 2020-06-23 MED ORDER — HYDRALAZINE HCL 20 MG/ML IJ SOLN
10.0000 mg | INTRAMUSCULAR | Status: AC
  Administered 2020-06-23: 10 mg via INTRAVENOUS
  Filled 2020-06-23: qty 1

## 2020-06-23 MED ORDER — POTASSIUM CHLORIDE 10 MEQ/100ML IV SOLN
10.0000 meq | Freq: Once | INTRAVENOUS | Status: AC
  Administered 2020-06-23: 10 meq via INTRAVENOUS
  Filled 2020-06-23: qty 100

## 2020-06-23 MED ORDER — LEVETIRACETAM IN NACL 1000 MG/100ML IV SOLN
1000.0000 mg | Freq: Once | INTRAVENOUS | Status: AC
  Administered 2020-06-23: 1000 mg via INTRAVENOUS
  Filled 2020-06-23: qty 100

## 2020-06-23 MED ORDER — POLYETHYLENE GLYCOL 3350 17 G PO PACK: 17.0000 g | PACK | Freq: Every day | ORAL | Status: DC | PRN

## 2020-06-23 MED ORDER — ADENOSINE 6 MG/2ML IV SOLN
12.0000 mg | Freq: Once | INTRAVENOUS | Status: AC
  Administered 2020-06-23: 12 mg via INTRAVENOUS

## 2020-06-23 MED ORDER — PIPERACILLIN-TAZOBACTAM 3.375 G IVPB 30 MIN
3.3750 g | Freq: Once | INTRAVENOUS | Status: AC
  Administered 2020-06-23: 3.375 g via INTRAVENOUS
  Filled 2020-06-23: qty 50

## 2020-06-23 MED ORDER — ADENOSINE 6 MG/2ML IV SOLN
18.0000 mg | Freq: Once | INTRAVENOUS | Status: AC
  Administered 2020-06-23: 18 mg via INTRAVENOUS

## 2020-06-23 MED ORDER — PROPOFOL 1000 MG/100ML IV EMUL
INTRAVENOUS | Status: AC
  Administered 2020-06-23: 10 ug/kg/min via INTRAVENOUS
  Filled 2020-06-23: qty 100

## 2020-06-23 MED ORDER — ROCURONIUM BROMIDE 50 MG/5ML IV SOLN
INTRAVENOUS | Status: AC | PRN
  Administered 2020-06-23: 100 mg via INTRAVENOUS

## 2020-06-23 MED ORDER — PROPOFOL 1000 MG/100ML IV EMUL
5.0000 ug/kg/min | INTRAVENOUS | Status: DC
  Administered 2020-06-24: 30 ug/kg/min via INTRAVENOUS
  Administered 2020-06-24: 15.079 ug/kg/min via INTRAVENOUS
  Administered 2020-06-25: 30 ug/kg/min via INTRAVENOUS
  Administered 2020-06-25: 10 ug/kg/min via INTRAVENOUS
  Administered 2020-06-25: 35 ug/kg/min via INTRAVENOUS
  Administered 2020-06-26: 30 ug/kg/min via INTRAVENOUS
  Filled 2020-06-23 (×6): qty 100

## 2020-06-23 MED ORDER — LORAZEPAM 2 MG/ML IJ SOLN
INTRAMUSCULAR | Status: AC
  Administered 2020-06-23: 1 mg
  Filled 2020-06-23: qty 1

## 2020-06-23 NOTE — ED Provider Notes (Signed)
[facility] EMERGENCY DEPARTMENT Provider Note   CSN: [id number] Arrival date & time: 06/23/20  2047     History Chief Complaint  Patient presents with  . Seizures    Joshua [name] is a 39 y.o. male.  HPI   The patient is a 39 year old male, he has a known history of intracerebral hemorrhage and subdural hematoma, he has had a brain edema injury after this hemorrhagic stroke, he is known to have seizures, chronic kidney disease, hypertension, he has a PEG tube but is not intubated or ventilated.  He currently lives in a long-term care facility.  According to the paramedics the patient is a full code.  He presents after having multiple seizures, he reportedly according to the paramedics had an unwitnessed seizure and an unwitnessed fall possibly from bed, then another seizure, then another seizure in the ambulance after which she was given Versed.  He arrives obtunded, hypoxic maximum oxygen around 75% on nonrebreather prehospital, the patient is mute at baseline and nonverbal.  The patient is critically ill and unable to give any information level 5 caveat applies.  History reviewed. No pertinent past medical history.  Patient Active Problem List   Diagnosis Date Noted  . PEG (percutaneous endoscopic gastrostomy) status ([place])   . Palliative care by specialist   . Goals of care, counseling/discussion   . AKI (acute kidney injury) ([place])   . Essential hypertension 10/07/2019  . Severe protein-calorie malnutrition ([place]) 10/07/2019  . CKD (chronic kidney disease), stage IV ([place]) 10/07/2019  . Seizure ([place]) 10/07/2019  . Acute respiratory failure ([place])   . Hemorrhagic stroke ([place])   . Cytotoxic brain edema ([place]) 09/10/2019  . ICH (intracerebral hemorrhage) ([place]) 09/09/2019  . Subdural hematoma ([place]) 09/09/2019  . Intracerebral hemorrhage ([place]) 09/09/2019    Past Surgical History:  Procedure Laterality Date  . CRANIOTOMY Left 09/09/2019   Procedure:  DECOMPRESSIVE CRANIECTOMY AND  HEMATOMA EVACUATION, IMPLANTATION OF SKULL FLAP IN RIGHT ABDOMEN;  Surgeon: Erline Levine, MD;  Location: [place];  Service: Neurosurgery;  Laterality: Left;  . ESOPHAGOGASTRODUODENOSCOPY (EGD) WITH PROPOFOL N/A 09/28/2019   Procedure: ESOPHAGOGASTRODUODENOSCOPY (EGD) WITH PROPOFOL;  Surgeon: Georganna Skeans, MD;  [facility] ENDOSCOPY;  Service: General;  Laterality: N/A;  . PEG PLACEMENT N/A 09/28/2019   Procedure: PERCUTANEOUS ENDOSCOPIC GASTROSTOMY (PEG) PLACEMENT;  Surgeon: Georganna Skeans, MD;  Location: [facility] ENDOSCOPY;  Service: General;  Laterality: N/A;       No family history on file.  Social History   Tobacco Use  . Smoking status: Former Research scientist (life sciences)  . Smokeless tobacco: Former Network engineer  . Vaping Use: Unknown  Substance Use Topics  . Alcohol use: Yes  . Drug use: Not Currently    Home Medications Prior to Admission medications   Medication Sig Start Date End Date Taking? Authorizing Provider  acetaminophen (TYLENOL) 325 MG tablet Place 2 tablets (650 mg total) into feeding tube every 4 (four) hours as needed for mild pain (temp > 100.5). 05/01/20   Barb Merino, MD  Amino Acids-Protein Hydrolys (FEEDING SUPPLEMENT, PRO-STAT SUGAR FREE 64,) LIQD Place 30 mLs into feeding tube 4 (four) times daily. 05/01/20   Barb Merino, MD  levETIRAcetam (KEPPRA) 100 MG/ML solution Place 5 mLs (500 mg total) into feeding tube 2 (two) times daily. 05/01/20   Barb Merino, MD  Nutritional Supplements (FEEDING SUPPLEMENT, JEVITY 1.2 CAL,) LIQD Place 474 mLs into feeding tube in the morning, at noon, in the evening, and at bedtime. 05/01/20   Ghimire,  Dante Gang, MD  polyethylene glycol (MIRALAX / GLYCOLAX) 17 g packet Place 17 g into feeding tube 2 (two) times daily. 05/01/20   Barb Merino, MD  Water For Irrigation, Sterile (FREE WATER) SOLN Place 200 mLs into feeding tube every 4 (four) hours. 05/01/20   Barb Merino, MD    Allergies    Patient has no known  allergies.  Review of Systems   Review of Systems  Unable to perform ROS: Acuity of condition    Physical Exam Updated Vital Signs BP (!) 191/146   Pulse (!) 132   Temp 97.6 F (36.4 C) (Rectal)   Resp 18   Wt 63 kg   SpO2 (!) 85%   BMI 23.11 kg/m   Physical Exam Constitutional:      Comments: Ill-appearing, acute distress, critically ill  HENT:     Head: Normocephalic and atraumatic.     Nose: Nose normal.     Mouth/Throat:     Comments: Jaw is clenched, blood coming from the mouth, multiple teeth are missing and poor dentition Eyes:     Comments: Conjunctive are erythematous, pupils seem to be equal round and reactive, slight disconjugate gaze  Neck:     Comments: Supple neck, no lymphadenopathy Cardiovascular:     Comments: Tachycardia, pulses are equal at the radial arteries Pulmonary:     Comments: Diffuse rhonchi rales tachypnea and hypoxia Abdominal:     Comments: Soft abdomen, no masses, there is some implantable mechanical device in the right upper abdomen  Musculoskeletal:     Comments: Cachectic, diffuse muscle atrophy  Skin:    Comments: No signs of significant skin breakdown  Neurological:     Comments: Obtunded, unresponsive to painful stimuli, eyes open occasionally spontaneously, does not speak or follow commands.     ED Results / Procedures / Treatments   Labs (all labs ordered are listed, but only abnormal results are displayed) Labs Reviewed  CULTURE, BLOOD (ROUTINE X 2)  CULTURE, BLOOD (ROUTINE X 2)  SARS CORONAVIRUS 2 BY RT PCR (HOSPITAL ORDER, [facility])  CBC  COMPREHENSIVE METABOLIC PANEL  LACTIC ACID, PLASMA  LACTIC ACID, PLASMA  CK  TRIGLYCERIDES  URINALYSIS, ROUTINE W REFLEX MICROSCOPIC  I-STAT ARTERIAL BLOOD GAS, ED    EKG EKG Interpretation  Date/Time:  Sunday June 23 2020 21:27:10 EDT Ventricular Rate:  147 PR Interval:    QRS Duration: 69 QT Interval:  301 QTC Calculation: 471 R  Axis:   81 Text Interpretation: Sinus tachycardia Consider right atrial enlargement LVH with secondary repolarization abnormality Anterior infarct, old ST depr, consider ischemia, inferior leads Baseline wander in lead(s) V1 Confirmed by Noemi Chapel [phone]) on 06/23/2020 10:40:15 PM   Radiology CT Head Wo Contrast  Result Date: 06/23/2020 CLINICAL DATA:  Un witnessed fall, seizure, unresponsive, previous craniotomy EXAM: CT HEAD WITHOUT CONTRAST TECHNIQUE: Contiguous axial images were obtained from the base of the skull through the vertex without intravenous contrast. COMPARISON:  09/17/2019 FINDINGS: Brain: There is a small amount of acute hemorrhage within the left frontal cortex, measuring 9 mm in size. This is in an area of extensive encephalomalacia with extracranial herniation at the site of chronic craniectomy defect. Hypodensities are seen throughout the right-sided periventricular and subcortical white matter, slightly progressive since prior study but likely reflecting chronic small vessel ischemic change. There is ex vacuo dilatation of the left lateral ventricle due to large area of encephalomalacia from prior left basal ganglia hemorrhage. Remaining midline structures are unremarkable.  Vascular: No hyperdense vessel or unexpected calcification. Skull: Large left frontotemporal craniectomy defect. No acute bony abnormalities. Sinuses/Orbits: Mucosal thickening is seen within the ethmoid air cells and right maxillary sinus. Remaining sinuses are clear. Other: None. IMPRESSION: 1. 9 mm area of acute intraparenchymal hemorrhage left frontal cortex. 2. Extensive encephalomalacia throughout the left cerebral hemisphere related to sequela from previous intraparenchymal hemorrhage. 3. 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 4. Likely chronic small-vessel ischemic changes throughout the right-sided periventricular white matter, progressive since prior exam. These  results were called by telephone at the time of interpretation on 06/23/2020 at 10:16 pm to provider [facility] , who verbally acknowledged these results. Electronically Signed   By: Randa Ngo M.D.   On: 06/23/2020 22:20   DG Chest Portable 1 View  Result Date: 06/23/2020 CLINICAL DATA:  Intubated EXAM: PORTABLE CHEST 1 VIEW COMPARISON:  09/28/2019 FINDINGS: Frontal view of the chest demonstrates endotracheal tube overlying tracheal air column tip at level of thoracic inlet. Enteric catheter passes below diaphragm tip excluded by collimation, side port projecting over gastric body. Cardiac silhouette is unremarkable. No airspace disease, effusion, or pneumothorax. No acute bony abnormalities. IMPRESSION: 1. Support devices as above. 2. No acute intrathoracic process. Electronically Signed   By: Randa Ngo M.D.   On: 06/23/2020 21:26    Procedures .Critical Care Performed by: Noemi Chapel, MD Authorized by: Noemi Chapel, MD   Critical care provider statement:    Critical care time (minutes):  35   Critical care time was exclusive of:  Separately billable procedures and treating other patients and teaching time   Critical care was necessary to treat or prevent imminent or life-threatening deterioration of the following conditions:  CNS failure or compromise and respiratory failure   Critical care was time spent personally by me on the following activities:  Blood draw for specimens, development of treatment plan with patient or surrogate, discussions with consultants, evaluation of patient's response to treatment, examination of patient, obtaining history from patient or surrogate, ordering and performing treatments and interventions, ordering and review of laboratory studies, ordering and review of radiographic studies, pulse oximetry, re-evaluation of patient's condition and review of old charts Comments:       Procedure Name: Intubation Date/Time: 06/23/2020 9:15 PM Performed by:  Noemi Chapel, MD Pre-anesthesia Checklist: Patient identified, Patient being monitored, Emergency Drugs available, Timeout performed and Suction available Oxygen Delivery Method: Non-rebreather mask Preoxygenation: Pre-oxygenation with 100% oxygen Induction Type: Rapid sequence Ventilation: Mask ventilation without difficulty Laryngoscope Size: Mac and 4 Grade View: Grade II Tube size: 7.5 mm Number of attempts: 1 Airway Equipment and Method: Stylet Placement Confirmation: ETT inserted through vocal cords under direct vision,  CO2 detector and Breath sounds checked- equal and bilateral Secured at: 23 cm Tube secured with: ETT holder Dental Injury: Teeth and Oropharynx as per pre-operative assessment  Difficulty Due To: Difficulty was unanticipated Comments:        (including critical care time)  Medications Ordered in ED Medications  vancomycin (VANCOCIN) IVPB 1000 mg/200 mL premix (1,000 mg Intravenous New Bag/Given 06/23/20 2224)  propofol (DIPRIVAN) 1000 MG/100ML infusion (30 mcg/kg/min  63 kg Intravenous Rate/Dose Change 06/23/20 2146)  LORazepam (ATIVAN) injection 1 mg (1 mg Intravenous Not Given 06/23/20 2144)  hydrALAZINE (APRESOLINE) injection 10 mg (has no administration in time range)  potassium chloride 10 mEq in 100 mL IVPB (has no administration in time range)  magnesium sulfate IVPB 2 g 50 mL (has no administration in time  range)  etomidate (AMIDATE) injection (20 mg Intravenous Given 06/23/20 2057)  rocuronium [facility]) injection (100 mg Intravenous Given 06/23/20 2059)  piperacillin-tazobactam (ZOSYN) IVPB 3.375 g (0 g Intravenous Stopped 06/23/20 2224)  levETIRAcetam (KEPPRA) IVPB 1000 mg/100 mL premix (0 mg Intravenous Stopped 06/23/20 2153)  LORazepam (ATIVAN) 2 MG/ML injection (1 mg  Given 06/23/20 2119)  adenosine (ADENOCARD) 6 MG/2ML injection 12 mg (12 mg Intravenous Given 06/23/20 2123)  adenosine (ADENOCARD) 6 MG/2ML injection 18 mg (18 mg Intravenous Given  06/23/20 2133)    ED Course  I have reviewed the triage vital signs and the nursing notes.  Pertinent labs & imaging results that were available during my care of the patient were reviewed by me and considered in my medical decision making (see chart for details).    MDM Rules/Calculators/A&P                          This patient is critically ill likely in status epilepticus, cause is unclear, the patient has had a fall, will need a CT scan to rule out intracranial hemorrhage, trauma, that being said he has a history of seizures in the past and is already taking Keppra per his medical administration record from the facility.  He received Versed prehospital but still arrives obtunded and hypoxic likely secondary to aspiration given that he was covered in vomitus including the nonrebreather that he arrived with.  The decision was made to intubate the patient on arrival secondary to this significant hypoxia and report of a full code.  Will discuss with family if possible,  The patient will be given antibiotics to cover for aspiration pneumonia, he will be given antiepileptics in the form of Keppra, sedatives in the form of Ativan and propofol for sedation, his blood pressure is currently 403 systolic.  The patient is also tachycardic, however after getting 18 mg of adenosine, the patient did not convert to sinus rhythm, he did not have underlying flutter waves, I suspect this is sinus tachycardia.  I discussed the care with Dr. Leonel Ramsay of the neurology service, he will come see the patient, we will need to make sure the patient is not in status epilepticus with continuous EEG and the patient will need to go to the intensive care unit.  Chest x-ray and CT scan of been ordered, antibiotics to cover for potential sepsis, ABG shows the patient is slightly hypoxic and acidotic but currently oxygenating better on a ventilator.  The patient maintained a consistent hypertension which was treated with  IV hydralazine, the hypokalemia was treated with intravenous potassium and magnesium, the patient continued on a propofol drip and after neurology consultation, the patient will go to the intensive care unit.  I discussed this with the family members, they state they want "everything done" and agree with intubation at this time.  I did discuss the case with the radiologist who read the CT scan and agrees that the patient does have evidence of intracranial, intraparenchymal hematoma which possibly is related to the fall.  This patient is critically ill with multiple comorbidities including status epilepticus, bleeding on the brain, metabolic abnormalities and hypoxia likely secondary to aspiration.  Discussed with Dr. Madalyn Rob of the ICU team, he will have the ICU team admit this patient to the intensive care unit  Final Clinical Impression(s) / ED Diagnoses Final diagnoses:  Intracranial hemorrhage ([place])  Status epilepticus ([place])  Hypertension, unspecified type  Hypokalemia  Aspiration pneumonia, unspecified aspiration pneumonia  type, unspecified laterality, unspecified part of lung ([place])  Acute respiratory failure with hypoxia ([place])      Noemi Chapel, MD 06/23/20 2247

## 2020-06-23 NOTE — ED Notes (Signed)
CCM at bedside 

## 2020-06-23 NOTE — ED Triage Notes (Signed)
Pt BIB [facility] from [facility], staff reports unwitnessed fall, unwitnessed seizure and then a witnessed seizure. Unresponsive on EMS arrival, left eye gaze. Pt seized again with EMS (tonic clonic), given 2.5mg  versed pta.

## 2020-06-23 NOTE — Consult Note (Signed)
Neurology Consultation Reason for Consult: Seizures Referring Physician: Sabra Heck, B  CC: Seizures  History is obtained from: chart  HPI: Joshua [name] is a 39 y.o. male with a history of seizures and previous  intracranial hematoma in September 2020.  He had a hemicraniectomy and was discharged after a 235-day stay in May of this year.  At the time of discharge he had not had seizures in quite some time.  He was discharged on Keppra 500 mg twice daily.   He was found after having an unwitnessed seizure and unwitnessed fall then was seen to have seizures.  He was given Versed, and transported to the ER.  In the ER, he received 2 mg of Ativan and was loaded with a gram of Keppra, and intubated and started on propofol.    CT head reveals a intraparenchymal hematoma near the surface of his chronically extracranially herniated brain.  Premorbid modified rankin scale: 5 ICH Score: 2   ROS: A 14 point ROS was performed and is negative except as noted in the HPI.   History reviewed. No pertinent past medical history.   FHx: Unable to obtain due to altered mental status.   Social History:  reports that he has quit smoking. He has quit using smokeless tobacco. He reports current alcohol use. He reports previous drug use.   Exam: Current vital signs: BP (!) 191/146   Pulse (!) 132   Temp 97.6 F (36.4 C) (Rectal)   Resp 18   Wt 63 kg   SpO2 (!) 85%   BMI 23.11 kg/m  Vital signs in last 24 hours: Temp:  [97.6 F (36.4 C)] 97.6 F (36.4 C) (06/27 2139) Pulse Rate:  [132] 132 (06/27 2100) Resp:  [14-18] 18 (06/27 2111) BP: (191)/(146) 191/146 (06/27 2100) SpO2:  [82 %-85 %] 85 % (06/27 2111) FiO2 (%):  [100 %] 100 % (06/27 2111) Weight:  [80 kg] 63 kg (06/27 2100)   Physical Exam  Constitutional: Appears well-developed and well-nourished.  Psych: Affect appropriate to situation Eyes: No scleral injection HENT: No OP obstrucion MSK: no joint deformities.  Cardiovascular:  Normal rate and regular rhythm.  Respiratory: Effort normal, non-labored breathing GI: Soft.  No distension. There is no tenderness.  Skin: WDI  Mental Status: Patient does not respond to verbal stimuli.  Does not respond to deep sternal rub.  Does not follow commands.  No verbalizations are noted.   Cranial Nerves: II: patient does not respond confrontation bilaterally, pupils right 3 mm, left 3 mm,and reactive bilaterally III,IV,VI: Cold caloric responses absent bilaterally. V,VII: corneal reflex absent bilaterally  VIII: patient does not respond to verbal stimuli XI: unable to test due to coma XII: unable to test due to coma  Motor: Extremities flaccid throughout.  No spontaneous movement noted.  No purposeful movements noted.  Sensory: Does not respond to noxious stimuli in any extremity.  Deep Tendon Reflexes:  Absent throughout.  Plantars: equivocal bilaterally  Cerebellar: Unable to perform due to coma  Gait: Unable to perform due to coma   I have reviewed labs in epic and the results pertinent to this consultation are: Na 139 K 2.8 Cr 1.65  I have reviewed the images obtained: CT head-small acute surface IPH with sequela of previous IVH  Impression: 39 year old male with status epielpticus and fall.  I suspect that the small IPH is likely secondary to his fall, honestly I am unclear if he had the fall precipitated the breakthrough seizures or if breakthrough seizures caused  the falls. The IPH is small and I do not think emergent surgical consultation is necessary, but would repeat CT in the AM.   Recommendations: 1) increase keppra to 1 g BID 2) repeat head CT in the AM 3) STAT EEG initially recommended,ready at the time of finalizing this note,  no current seizure activity.   This patient is critically ill and at significant risk of neurological worsening, death and care requires constant monitoring of vital signs, hemodynamics,respiratory and cardiac  monitoring, neurological assessment, discussion with family, other specialists and medical decision making of high complexity. I spent 45 minutes of neurocritical care time  in the care of  this patient. This was time spent independent of any time provided by nurse practitioner or PA.  Roland Rack, MD [facility] [phone]  If 7pm- 7am, please page neurology on call as listed in [place]. 06/24/2020  1:25 AM

## 2020-06-23 NOTE — ED Notes (Signed)
18mg  adenosine given per verbal order Dr. Sabra Heck.

## 2020-06-23 NOTE — ED Notes (Addendum)
Pt HR 150, Dr. Sabra Heck aware, zoll pads applied. 12mg  adenosine given.

## 2020-06-24 DIAGNOSIS — R569 Unspecified convulsions: Secondary | ICD-10-CM

## 2020-06-24 DIAGNOSIS — J9601 Acute respiratory failure with hypoxia: Secondary | ICD-10-CM

## 2020-06-24 DIAGNOSIS — I629 Nontraumatic intracranial hemorrhage, unspecified: Secondary | ICD-10-CM

## 2020-06-24 LAB — CBC
HCT: 45.1 % (ref 39.0–52.0)
Hemoglobin: 14.1 g/dL (ref 13.0–17.0)
MCH: 25.4 pg — ABNORMAL LOW (ref 26.0–34.0)
MCHC: 31.3 g/dL (ref 30.0–36.0)
MCV: 81.3 fL (ref 80.0–100.0)
Platelets: 258 10*3/uL (ref 150–400)
RBC: 5.55 MIL/uL (ref 4.22–5.81)
RDW: 15.9 % — ABNORMAL HIGH (ref 11.5–15.5)
WBC: 13.7 10*3/uL — ABNORMAL HIGH (ref 4.0–10.5)
nRBC: 0 % (ref 0.0–0.2)

## 2020-06-24 LAB — CBG MONITORING, ED
Glucose-Capillary: 112 mg/dL — ABNORMAL HIGH (ref 70–99)
Glucose-Capillary: 120 mg/dL — ABNORMAL HIGH (ref 70–99)
Glucose-Capillary: 180 mg/dL — ABNORMAL HIGH (ref 70–99)

## 2020-06-24 LAB — I-STAT ARTERIAL BLOOD GAS, ED
Acid-base deficit: 6 mmol/L — ABNORMAL HIGH (ref 0.0–2.0)
Bicarbonate: 18.2 mmol/L — ABNORMAL LOW (ref 20.0–28.0)
Calcium, Ion: 1.2 mmol/L (ref 1.15–1.40)
HCT: 43 % (ref 39.0–52.0)
Hemoglobin: 14.6 g/dL (ref 13.0–17.0)
O2 Saturation: 99 %
Patient temperature: 99.5
Potassium: 3 mmol/L — ABNORMAL LOW (ref 3.5–5.1)
Sodium: 140 mmol/L (ref 135–145)
TCO2: 19 mmol/L — ABNORMAL LOW (ref 22–32)
pCO2 arterial: 32.4 mmHg (ref 32.0–48.0)
pH, Arterial: 7.359 (ref 7.350–7.450)
pO2, Arterial: 129 mmHg — ABNORMAL HIGH (ref 83.0–108.0)

## 2020-06-24 LAB — LACTIC ACID, PLASMA
Lactic Acid, Venous: 6 mmol/L (ref 0.5–1.9)
Lactic Acid, Venous: 3.9 mmol/L (ref 0.5–1.9)

## 2020-06-24 LAB — BASIC METABOLIC PANEL
Anion gap: 16 — ABNORMAL HIGH (ref 5–15)
Anion gap: 12 (ref 5–15)
Anion gap: 10 (ref 5–15)
BUN: 17 mg/dL (ref 6–20)
BUN: 17 mg/dL (ref 6–20)
BUN: 19 mg/dL (ref 6–20)
CO2: 18 mmol/L — ABNORMAL LOW (ref 22–32)
CO2: 19 mmol/L — ABNORMAL LOW (ref 22–32)
CO2: 20 mmol/L — ABNORMAL LOW (ref 22–32)
Calcium: 9.1 mg/dL (ref 8.9–10.3)
Calcium: 8.7 mg/dL — ABNORMAL LOW (ref 8.9–10.3)
Calcium: 8.8 mg/dL — ABNORMAL LOW (ref 8.9–10.3)
Chloride: 104 mmol/L (ref 98–111)
Chloride: 107 mmol/L (ref 98–111)
Chloride: 108 mmol/L (ref 98–111)
Creatinine, Ser: 1.76 mg/dL — ABNORMAL HIGH (ref 0.61–1.24)
Creatinine, Ser: 1.94 mg/dL — ABNORMAL HIGH (ref 0.61–1.24)
Creatinine, Ser: 2.46 mg/dL — ABNORMAL HIGH (ref 0.61–1.24)
GFR calc Af Amer: 55 mL/min — ABNORMAL LOW (ref >60)
GFR calc Af Amer: 49 mL/min — ABNORMAL LOW (ref >60)
GFR calc Af Amer: 37 mL/min — ABNORMAL LOW (ref >60)
GFR calc non Af Amer: 47 mL/min — ABNORMAL LOW (ref >60)
GFR calc non Af Amer: 42 mL/min — ABNORMAL LOW (ref >60)
GFR calc non Af Amer: 32 mL/min — ABNORMAL LOW (ref >60)
Glucose, Bld: 211 mg/dL — ABNORMAL HIGH (ref 70–99)
Glucose, Bld: 106 mg/dL — ABNORMAL HIGH (ref 70–99)
Glucose, Bld: 110 mg/dL — ABNORMAL HIGH (ref 70–99)
Potassium: 2.6 mmol/L — CL (ref 3.5–5.1)
Potassium: 5.3 mmol/L — ABNORMAL HIGH (ref 3.5–5.1)
Potassium: 4.6 mmol/L (ref 3.5–5.1)
Sodium: 138 mmol/L (ref 135–145)
Sodium: 138 mmol/L (ref 135–145)
Sodium: 138 mmol/L (ref 135–145)

## 2020-06-24 LAB — GLUCOSE, CAPILLARY
Glucose-Capillary: 119 mg/dL — ABNORMAL HIGH (ref 70–99)
Glucose-Capillary: 105 mg/dL — ABNORMAL HIGH (ref 70–99)
Glucose-Capillary: 95 mg/dL (ref 70–99)
Glucose-Capillary: 101 mg/dL — ABNORMAL HIGH (ref 70–99)

## 2020-06-24 LAB — MRSA PCR SCREENING: MRSA by PCR: NEGATIVE

## 2020-06-24 LAB — HEMOGLOBIN A1C
Hgb A1c MFr Bld: 6 % — ABNORMAL HIGH (ref 4.8–5.6)
Mean Plasma Glucose: 125.5 mg/dL

## 2020-06-24 LAB — MAGNESIUM
Magnesium: 2 mg/dL (ref 1.7–2.4)
Magnesium: 2.2 mg/dL (ref 1.7–2.4)

## 2020-06-24 LAB — HIV ANTIBODY (ROUTINE TESTING W REFLEX): HIV Screen 4th Generation wRfx: NONREACTIVE

## 2020-06-24 LAB — PHOSPHORUS: Phosphorus: 1.2 mg/dL — ABNORMAL LOW (ref 2.5–4.6)

## 2020-06-24 MED ORDER — MAGNESIUM SULFATE 2 GM/50ML IV SOLN
2.0000 g | Freq: Once | INTRAVENOUS | Status: AC
  Administered 2020-06-24: 2 g via INTRAVENOUS
  Filled 2020-06-24: qty 50

## 2020-06-24 MED ORDER — CHLORHEXIDINE GLUCONATE 0.12% ORAL RINSE (MEDLINE KIT)
15.0000 mL | Freq: Two times a day (BID) | OROMUCOSAL | Status: DC
  Administered 2020-06-24 – 2020-06-26 (×5): 15 mL via OROMUCOSAL

## 2020-06-24 MED ORDER — ACETAMINOPHEN 325 MG PO TABS
650.0000 mg | ORAL_TABLET | Freq: Four times a day (QID) | ORAL | Status: DC | PRN
  Administered 2020-06-24: 650 mg
  Filled 2020-06-24: qty 2

## 2020-06-24 MED ORDER — SODIUM PHOSPHATES 45 MMOLE/15ML IV SOLN
30.0000 mmol | Freq: Once | INTRAVENOUS | Status: AC
  Administered 2020-06-24: 30 mmol via INTRAVENOUS
  Filled 2020-06-24: qty 10

## 2020-06-24 MED ORDER — DOCUSATE SODIUM 50 MG/5ML PO LIQD
100.0000 mg | Freq: Two times a day (BID) | ORAL | Status: DC
  Administered 2020-06-24 – 2020-06-25 (×3): 100 mg via ORAL
  Filled 2020-06-24 (×5): qty 10

## 2020-06-24 MED ORDER — FENTANYL CITRATE (PF) 100 MCG/2ML IJ SOLN
50.0000 ug | INTRAMUSCULAR | Status: DC | PRN
  Administered 2020-06-24: 50 ug via INTRAVENOUS

## 2020-06-24 MED ORDER — SODIUM CHLORIDE 0.9 % IV SOLN
INTRAVENOUS | Status: DC | PRN
  Administered 2020-06-24 (×2): 250 mL via INTRAVENOUS

## 2020-06-24 MED ORDER — SODIUM CHLORIDE 0.9 % IV SOLN
3.0000 g | Freq: Four times a day (QID) | INTRAVENOUS | Status: AC
  Administered 2020-06-24 – 2020-06-26 (×10): 3 g via INTRAVENOUS
  Filled 2020-06-24 (×8): qty 8
  Filled 2020-06-24: qty 3
  Filled 2020-06-24 (×2): qty 8

## 2020-06-24 MED ORDER — ACETAMINOPHEN 325 MG PO TABS
650.0000 mg | ORAL_TABLET | Freq: Four times a day (QID) | ORAL | Status: DC | PRN
  Administered 2020-06-25 (×2): 650 mg
  Filled 2020-06-24 (×2): qty 2

## 2020-06-24 MED ORDER — POTASSIUM CHLORIDE 10 MEQ/100ML IV SOLN
10.0000 meq | INTRAVENOUS | Status: AC
  Administered 2020-06-24 (×4): 10 meq via INTRAVENOUS
  Filled 2020-06-24 (×4): qty 100

## 2020-06-24 MED ORDER — ACETAMINOPHEN 160 MG/5ML PO SOLN: 650.0000 mg | Freq: Four times a day (QID) | ORAL | Status: DC | PRN

## 2020-06-24 MED ORDER — POLYETHYLENE GLYCOL 3350 17 G PO PACK
17.0000 g | PACK | Freq: Every day | ORAL | Status: DC
  Administered 2020-06-24: 17 g via ORAL
  Filled 2020-06-24: qty 1

## 2020-06-24 MED ORDER — ORAL CARE MOUTH RINSE
15.0000 mL | OROMUCOSAL | Status: DC
  Administered 2020-06-24 – 2020-06-26 (×20): 15 mL via OROMUCOSAL

## 2020-06-24 MED ORDER — FENTANYL CITRATE (PF) 100 MCG/2ML IJ SOLN
50.0000 ug | INTRAMUSCULAR | Status: DC | PRN
  Filled 2020-06-24: qty 2

## 2020-06-24 MED ORDER — LEVETIRACETAM IN NACL 1000 MG/100ML IV SOLN
1000.0000 mg | Freq: Two times a day (BID) | INTRAVENOUS | Status: DC
  Filled 2020-06-24: qty 100

## 2020-06-24 MED ORDER — LACTATED RINGERS IV BOLUS
1000.0000 mL | Freq: Once | INTRAVENOUS | Status: AC
  Administered 2020-06-24: 1000 mL via INTRAVENOUS

## 2020-06-24 MED ORDER — CHLORHEXIDINE GLUCONATE CLOTH 2 % EX PADS
6.0000 | MEDICATED_PAD | Freq: Every day | CUTANEOUS | Status: DC
  Administered 2020-06-24 – 2020-07-04 (×11): 6 via TOPICAL

## 2020-06-24 NOTE — ED Notes (Signed)
Mother called [name] inquiring about patient update given. [phone] ( Father Julius Bowels)

## 2020-06-24 NOTE — H&P (Addendum)
NAME:  Joshua [name], MRN:  [id number], DOB:  18-Sep-1981, LOS: 1 ADMISSION DATE:  06/23/2020, CONSULTATION DATE:  06/24/20 REFERRING MD:  EDP, CHIEF COMPLAINT:  seizure   Brief History   39 y.o. M with PMH of seizures on Keppra after previous ICH. Hemicraniectomy and prolonged hospital stay who presents from assisted living after breakthrough seizure activity and unwitnessed fall.  Intubated and found to have new L frontal ICH  History of present illness   Joshua [name] is a 39 y.o. M who suffered seizure and L hemispheric ICH in 08/2019 s/p L craniotomy and decompressive craniectomy with evacuation of ICH and prolonged hospital stay.  He was aphasic and nonverbal with right hemiplegia and significant cognitive impairment and was discharged to SNF on Keppra twice daily.   Per patient's brother, he has been at his baseline recently with no known illnesses or breakthrough seizures until today when he seized and had an unwitnessed fall possibly out of bed.  Patient had another seizure after EMS arrival with tonic-clonic movement.  On arrival to the ED he was intubated and started on propofol, neurology consulted.  A CT head was obtained left frontal ICH.  He had an episode of tachycardia in the ED which was treated with adenosine, pt was hypertensive and given Hydralazine.  Labs reveal elevated lactic acid of 7 and hypokalemia of 2.8. Patient's brother is at the bedside and confirms that he is full code and they would like aggressive care.   Past Medical History  [facility] Events   6/28 Admit to PCCM  Consults:  Neurology  Procedures:  ETT 6/27-  Significant Diagnostic Tests:  06/24/20  CXR>> no acute findings 6/28 CT head>>9 mm area of acute intraparenchymal hemorrhage left frontal cortex.  Micro Data:  6/28 blood cultures x2>> 6/27 SARS-Covid-2>> negative 6/27 urine culture>>  Antimicrobials:  Zosyn 6/27 Vancomycin 6/27  Interim history/subjective:  Patient  remained hypertensive so started on Cardene infusion  Objective   Blood pressure 117/86, pulse (!) 130, temperature (!) 96 F (35.6 C), resp. rate (!) 26, weight 63 kg, SpO2 95 %.    Vent Mode: PRVC FiO2 (%):  [100 %] 100 % Set Rate:  [18 bmp] 18 bmp Vt Set:  [600 mL] 600 mL PEEP:  [10 cmH20] 10 cmH20 Plateau Pressure:  [29 cmH20-33 cmH20] 33 cmH20  No intake or output data in the 24 hours ending 06/24/20 0039 Filed Weights   06/23/20 2100  Weight: 63 kg    General:  Well nourished M, intubated and sedated HEENT: MM pink/moist Neuro: unresponsive on propofol, pupils reactive, not triggering vent CV: s1s2 tachycardic, regular, no m/r/g PULM:  CTAB, ETT in place on full vent support GI: soft, bsx4 active  Extremities: warm/dry, muscle wasting without edema  Skin: no rashes or lesions   Resolved Hospital Problem list     Assessment & Plan:   Acute L frontal ICH and breakthrough seizure activity with subsequent hypoxic respiratory failure in the setting of prior L hemispheric ICH s/p craniotomy and decompressive craniectomy Loaded with Keppra in the ED and evaluated by Neurology -LTM EEG -sedation with propofol -Cardene gtt to maintain SBP <140 -appreciate neurology recommendations, new ICH is small and near the site of previous craniectomy, so no indication for neurosurgery consult --Maintain full vent support with SAT/SBT as tolerated -titrate Vent setting to maintain SpO2 greater than or equal to 90%. -HOB elevated 30 degrees. -Plateau pressures less than 30 cm H20.  -Follow chest x-ray,  ABG prn.   -Bronchial hygiene and RT/bronchodilator protocol.  HTN On clonidine and Norvasc chronically, hold while on Nicardipine infusion   Hypokalemia Possibly nutritional deficiency, pt has PEG on TF, given 58meq in the ED -repeat BMP and Mag, replete K as needed   Chronic renal insufficiency Creatinine 1.65, appears this may be new baseline as consistent with renal function  two months ago  Lactic acidosis Likely secondary to seizures, no evidence of infection on CXR, UA or by history  Best practice:  Diet: NPO Pain/Anxiety/Delirium protocol (if indicated): Propofol and Fentanyl VAP protocol (if indicated): HOB 30 degrees, suction prn DVT prophylaxis: SCD's GI prophylaxis: protonix Glucose control: SSI Mobility: bed rest Code Status: full code Family Communication: Brother updated at the bedside Disposition: ICU  Labs   CBC: Recent Labs  Lab 06/23/20 2110 06/23/20 2129  WBC 13.4*  --   HGB 13.1 16.0  HCT 43.9 47.0  MCV 84.1  --   PLT 243  --     Basic Metabolic Panel: Recent Labs  Lab 06/23/20 2110 06/23/20 2129  NA 139 141  K 2.8* 2.7*  CL 105  --   CO2 20*  --   GLUCOSE 189*  --   BUN 17  --   CREATININE 1.65*  --   CALCIUM 8.5*  --    GFR: Estimated Creatinine Clearance: 51.8 mL/min (A) (by C-G formula based on SCr of 1.65 mg/dL (H)). Recent Labs  Lab 06/23/20 2110  WBC 13.4*  LATICACIDVEN 7.3*    Liver Function Tests: Recent Labs  Lab 06/23/20 2110  AST 20  ALT 9  ALKPHOS 103  BILITOT 0.4  PROT 6.7  ALBUMIN 3.1*   No results for input(s): LIPASE, AMYLASE in the last 168 hours. No results for input(s): AMMONIA in the last 168 hours.  ABG    Component Value Date/Time   PHART 7.300 (L) 06/23/2020 2129   PCO2ART 45.1 06/23/2020 2129   PO2ART 76 (L) 06/23/2020 2129   HCO3 22.2 06/23/2020 2129   TCO2 24 06/23/2020 2129   ACIDBASEDEF 4.0 (H) 06/23/2020 2129   O2SAT 94.0 06/23/2020 2129     Coagulation Profile: No results for input(s): INR, PROTIME in the last 168 hours.  Cardiac Enzymes: Recent Labs  Lab 06/23/20 2110  CKTOTAL 90    HbA1C: Hgb A1c MFr Bld  Date/Time Value Ref Range Status  09/10/2019 12:56 AM 5.6 4.8 - 5.6 % Final    Comment:    (NOTE)         Prediabetes: 5.7 - 6.4         Diabetes: >6.4         Glycemic control for adults with diabetes: <7.0     CBG: No results for  input(s): GLUCAP in the last 168 hours.  Review of Systems:   Unable to obtain secondary to mental status  Past Medical History  He,  has no past medical history on file.   Surgical History    Past Surgical History:  Procedure Laterality Date  . CRANIOTOMY Left 09/09/2019   Procedure: DECOMPRESSIVE CRANIECTOMY AND  HEMATOMA EVACUATION, IMPLANTATION OF SKULL FLAP IN RIGHT ABDOMEN;  Surgeon: Erline Levine, MD;  Location: [place];  Service: Neurosurgery;  Laterality: Left;  . ESOPHAGOGASTRODUODENOSCOPY (EGD) WITH PROPOFOL N/A 09/28/2019   Procedure: ESOPHAGOGASTRODUODENOSCOPY (EGD) WITH PROPOFOL;  Surgeon: Georganna Skeans, MD;  Location: [facility];  Service: General;  Laterality: N/A;  . PEG PLACEMENT N/A 09/28/2019   Procedure: PERCUTANEOUS ENDOSCOPIC GASTROSTOMY (PEG) PLACEMENT;  Surgeon: Georganna Skeans, MD;  Location: [facility];  Service: General;  Laterality: N/A;     Social History   reports that he has quit smoking. He has quit using smokeless tobacco. He reports current alcohol use. He reports previous drug use.   Family History   His family history is not on file.   Allergies No Known Allergies   Home Medications  Prior to Admission medications   Medication Sig Start Date End Date Taking? Authorizing Provider  acetaminophen (TYLENOL) 325 MG tablet Place 2 tablets (650 mg total) into feeding tube every 4 (four) hours as needed for mild pain (temp > 100.5). Patient taking differently: Place 650 mg into feeding tube See admin instructions. 650 mg per tube two times a day and an additional 650 mg every four hours as needed for fever or pain- NOT TO EXCEED 4,000 MG/DAY FROM ALL COMBINED SOURCES 05/01/20  Yes Barb Merino, MD  amLODipine (NORVASC) 10 MG tablet Place 10 mg into feeding tube in the morning.   Yes [provider]  baclofen (LIORESAL) 10 MG tablet Place 10 mg into feeding tube at bedtime.   Yes [provider]  cloNIDine (CATAPRES - DOSED IN MG/24  HR) 0.1 mg/24hr patch Place 0.1 mg onto the skin every Friday.    Yes [provider]  levETIRAcetam (KEPPRA) 100 MG/ML solution Place 5 mLs (500 mg total) into feeding tube 2 (two) times daily. 05/01/20  Yes Barb Merino, MD  magnesium hydroxide (MILK OF MAGNESIA) 400 MG/5ML suspension Place 30 mLs into feeding tube every 12 (twelve) hours as needed for mild constipation ("for 48 hours").   Yes [provider]  NON FORMULARY Take by mouth See admin instructions. Magic Cup Frozen Desserts- One cup by mouth two times a day- with lunch and supper   Yes [provider]  polyethylene glycol (MIRALAX / GLYCOLAX) 17 g packet Place 17 g into feeding tube 2 (two) times daily. Patient taking differently: Place 17 g into feeding tube See admin instructions. Mix 17 grams into 4-8 ounces of water and administer, per tube, two times a day 05/01/20  Yes Ghimire, Dante Gang, MD  Water For Irrigation, Sterile (FREE WATER) SOLN Place 200 mLs into feeding tube every 4 (four) hours. Patient taking differently: Place 50-250 mLs into feeding tube See admin instructions. 50 ml's before and after each feeding and an additional 250 ml's every 6 hours for hydration 05/01/20  Yes Ghimire, Dante Gang, MD  Amino Acids-Protein Hydrolys (FEEDING SUPPLEMENT, PRO-STAT SUGAR FREE 64,) LIQD Place 30 mLs into feeding tube 4 (four) times daily. Patient not taking: Reported on 06/23/2020 05/01/20   Barb Merino, MD  Nutritional Supplements (FEEDING SUPPLEMENT, JEVITY 1.2 CAL,) LIQD Place 474 mLs into feeding tube in the morning, at noon, in the evening, and at bedtime. Patient not taking: Reported on 06/23/2020 05/01/20   Barb Merino, MD     Critical care time: 55 minutes       CRITICAL CARE Performed by: Otilio Carpen Naydeen Speirs   Total critical care time: 55 minutes  Critical care time was exclusive of separately billable procedures and treating other patients.  Critical care was necessary to treat or prevent imminent  or life-threatening deterioration.  Critical care was time spent personally by me on the following activities: development of treatment plan with patient and/or surrogate as well as nursing, discussions with consultants, evaluation of patient's response to treatment, examination of patient, obtaining history from patient or surrogate, ordering and performing treatments and interventions,  ordering and review of laboratory studies, ordering and review of radiographic studies, pulse oximetry and re-evaluation of patient's condition.   Otilio Carpen Kiyoshi Schaab, PA-C

## 2020-06-24 NOTE — ED Notes (Signed)
CC MD updated on patient current status.

## 2020-06-24 NOTE — ED Notes (Addendum)
Vonna Kotyk NP for Neurosurgery at bedside. Update on patient given. Safety mitten applied to left hand, patient is moving hand towards tubes.

## 2020-06-24 NOTE — Progress Notes (Signed)
Pt being transferred to 23M moving EEG equipment.

## 2020-06-24 NOTE — Procedures (Signed)
[name]  MRN: [id number]  Epilepsy Attending: Lora Havens  Referring Physician/Provider: Elease Etienne, PA Duration: 06/23/2020 2319 to 06/24/2020 0720  Patient history: 39 year old male with status epilepticus and fall. CT head showed small left frontal IPH. EEG to evaluate for seizure  Level of alertness: Comatose  AEDs during EEG study: LEV  Technical aspects: This EEG study was done with scalp electrodes positioned according to the 10-20 International system of electrode placement. Electrical activity was acquired at a sampling rate of 500Hz  and reviewed with a high frequency filter of 70Hz  and a low frequency filter of 1Hz . EEG data were recorded continuously and digitally stored.   Description: EEG showed continuous generalized and maximal left frontal region 2-3Hz  delta slowing. Sharp waves were also seen in left frontal region.  Hyperventilation and photic stimulation were not performed.     ABNORMALITY -Continuous slow, generalized and maximal left frontal region -Sharp wave, left frontal region  IMPRESSION: This study showed evidence of potential epileptogenicity as well as cortical dysfunction in left frontal region likely secondary to underlying structural abnormality. Additionally, there is evidence of severe diffuse encephalopathy, non specific to etiology but could be related to sedation. No seizures were seen throughout the recording.  [name] Barbra Sarks

## 2020-06-24 NOTE — Consult Note (Signed)
Reason for Consult: Intraparenchymal hematoma  Referring Physician: Dr. 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 He was discharged after a 235-day stay in May of this year.  At the time of discharge he had not had seizures in quite some time.  He was discharged on Keppra 500 mg twice daily.    The patient was found after having an unwitnessed seizure and unwitnessed fall, and then had a witnessed seizure. He was given Versed, and transported to the ER.  In the ER, he received 2 mg of Ativan and was loaded with a gram of Keppra, and intubated and started on propofol. EEG to evaluate for seizure.  History reviewed. No pertinent past medical history.  Past Surgical History:  Procedure Laterality Date   CRANIOTOMY Left 09/09/2019   Procedure: DECOMPRESSIVE CRANIECTOMY AND  HEMATOMA EVACUATION, IMPLANTATION OF SKULL FLAP IN RIGHT ABDOMEN;  Surgeon: Erline Levine, MD;  Location: [facility];  Service: Neurosurgery;  Laterality: Left;   ESOPHAGOGASTRODUODENOSCOPY (EGD) WITH PROPOFOL N/A 09/28/2019   Procedure: ESOPHAGOGASTRODUODENOSCOPY (EGD) WITH PROPOFOL;  Surgeon: Georganna Skeans, MD;  Location: [facility] ENDOSCOPY;  Service: General;  Laterality: N/A;   PEG PLACEMENT N/A 09/28/2019   Procedure: PERCUTANEOUS ENDOSCOPIC GASTROSTOMY (PEG) PLACEMENT;  Surgeon: Georganna Skeans, MD;  Location: [facility] ENDOSCOPY;  Service: General;  Laterality: N/A;    No family history on file.  Social History:  reports that he has quit smoking. He has quit using smokeless tobacco. He reports current alcohol use. He reports previous drug use.  Allergies: No Known Allergies  Medications: I have reviewed the patient's current medications.  Results for orders placed or  performed during the hospital encounter of 06/23/20 (from the past 48 hour(s))  CBC     Status: Abnormal   Collection Time: 06/23/20  9:10 PM  Result Value Ref Range   WBC 13.4 (H) 4.0 - 10.5 K/uL   RBC 5.22 4.22 - 5.81 MIL/uL   Hemoglobin 13.1 13.0 - 17.0 g/dL   HCT 43.9 39 - 52 %   MCV 84.1 80.0 - 100.0 fL   MCH 25.1 (L) 26.0 - 34.0 pg   MCHC 29.8 (L) 30.0 - 36.0 g/dL   RDW 15.9 (H) 11.5 - 15.5 %   Platelets 243 150 - 400 K/uL   nRBC 0.0 0.0 - 0.2 %    Comment: Performed at [facility], [place] [address], [place], [place] [address]  Comprehensive metabolic panel     Status: Abnormal   Collection Time: 06/23/20  9:10 PM  Result Value Ref Range   Sodium 139 135 - 145 mmol/L   Potassium 2.8 (L) 3.5 - 5.1 mmol/L   Chloride 105 98 - 111 mmol/L   CO2 20 (L) 22 - 32 mmol/L   Glucose, Bld 189 (H) 70 - 99 mg/dL    Comment: Glucose reference range applies only to samples taken after fasting for at least 8 hours.   BUN 17 6 - 20 mg/dL   Creatinine, Ser 1.65 (H) 0.61 - 1.24 mg/dL   Calcium 8.5 (L) 8.9 - 10.3 mg/dL   Total Protein 6.7 6.5 - 8.1 g/dL   Albumin 3.1 (L) 3.5 - 5.0 g/dL   AST 20 15 - 41 U/L   ALT 9 0 - 44 U/L  Alkaline Phosphatase 103 38 - 126 U/L   Total Bilirubin 0.4 0.3 - 1.2 mg/dL   GFR calc non Af Amer 51 (L) >60 mL/min   GFR calc Af Amer 59 (L) >60 mL/min   Anion gap 14 5 - 15    Comment: Performed at [facility] [address] [place], [place] [address]  Lactic acid, plasma     Status: Abnormal   Collection Time: 06/23/20  9:10 PM  Result Value Ref Range   Lactic Acid, Venous 7.3 (HH) 0.5 - 1.9 mmol/L    Comment: CRITICAL RESULT CALLED TO, READ BACK BY AND VERIFIED WITH: RN C STRAUGHAM @[id number]  06/23/20 BY S GEZAHEGN Performed at [facility], [place] [address], [place], [place] [address]   CK     Status: None   Collection Time: 06/23/20  9:10 PM  Result Value Ref Range   Total CK 90 49.0 - 397.0 U/L    Comment: Performed at [facility], [place] [address], [place], [place] [address]  Triglycerides     Status: Abnormal   Collection Time: 06/23/20  9:10 PM  Result Value Ref Range   Triglycerides 156 (H) <150 mg/dL    Comment: Performed at [facility] [address], [place], [place] [address]  SARS Coronavirus 2 by RT PCR (hospital order, performed in [facility] hospital lab) Nasopharyngeal Nasopharyngeal Swab     Status: None   Collection Time: 06/23/20  9:15 PM   Specimen: Nasopharyngeal Swab  Result Value Ref Range   SARS Coronavirus 2 NEGATIVE NEGATIVE    Comment: (NOTE) SARS-CoV-2 target nucleic acids are NOT DETECTED.  The SARS-CoV-2 RNA is generally detectable in upper and lower respiratory specimens during the acute phase of infection. The lowest concentration of SARS-CoV-2 viral copies this assay can detect is 250 copies / mL. A negative result does not preclude SARS-CoV-2 infection and should not be used as the sole basis for treatment or other patient management decisions.  A negative result may occur with improper specimen collection / handling, submission of specimen other than nasopharyngeal swab, presence of viral mutation(s) within the areas targeted by this assay, and inadequate number of viral copies (<250 copies / mL). A negative result must be combined with clinical observations, patient history, and epidemiological information.  Fact Sheet for Patients:   StrictlyIdeas.no  Fact Sheet for Healthcare Providers: BankingDealers.co.za  This test is not yet approved or  cleared by the [place] FDA and has been authorized for detection and/or diagnosis of SARS-CoV-2 by FDA under an Emergency Use Authorization (EUA).  This EUA will remain in effect (meaning this test can be used) for the duration of the COVID-19 declaration under Section 564(b)(1) of the Act, 21 U.S.C. section 360bbb-3(b)(1), unless the authorization is terminated or revoked  sooner.  Performed at [facility], [place] [address], [place], [place] [address]   I-Stat arterial blood gas, [facility] ED)     Status: Abnormal   Collection Time: 06/23/20  9:29 PM  Result Value Ref Range   pH, Arterial 7.300 (L) 7.35 - 7.45   pCO2 arterial 45.1 32 - 48 mmHg   pO2, Arterial 76 (L) 83 - 108 mmHg   Bicarbonate 22.2 20.0 - 28.0 mmol/L   TCO2 24 22 - 32 mmol/L   O2 Saturation 94.0 %   Acid-base deficit 4.0 (H) 0.0 - 2.0 mmol/L   Sodium 141 135 - 145 mmol/L   Potassium 2.7 (LL)  3.5 - 5.1 mmol/L   Calcium, Ion 1.30 1.15 - 1.40 mmol/L   HCT 47.0 39 - 52 %   Hemoglobin 16.0 13.0 - 17.0 g/dL   Patient temperature 98.6 F    Collection site Radial    Drawn by RT    Sample type ARTERIAL   Urinalysis, Routine w reflex microscopic     Status: Abnormal   Collection Time: 06/23/20 10:08 PM  Result Value Ref Range   Color, Urine STRAW (A) YELLOW   APPearance CLEAR CLEAR   Specific Gravity, Urine 1.010 1.005 - 1.030   pH 5.0 5.0 - 8.0   Glucose, UA >=500 (A) NEGATIVE mg/dL   Hgb urine dipstick MODERATE (A) NEGATIVE   Bilirubin Urine NEGATIVE NEGATIVE   Ketones, ur NEGATIVE NEGATIVE mg/dL   Protein, ur >=300 (A) NEGATIVE mg/dL   Nitrite NEGATIVE NEGATIVE   Leukocytes,Ua NEGATIVE NEGATIVE   RBC / HPF 0-5 0 - 5 RBC/hpf   WBC, UA 0-5 0 - 5 WBC/hpf   Bacteria, UA NONE SEEN NONE SEEN   Mucus PRESENT     Comment: Performed at [facility], [address] [place], [place] [address]  CBG monitoring, ED     Status: Abnormal   Collection Time: 06/24/20 12:44 AM  Result Value Ref Range   Glucose-Capillary 180 (H) 70 - 99 mg/dL    Comment: Glucose reference range applies only to samples taken after fasting for at least 8 hours.  Lactic acid, plasma     Status: Abnormal   Collection Time: 06/24/20 12:48 AM  Result Value Ref Range   Lactic Acid, Venous 6.0 (HH) 0.5 - 1.9 mmol/L    Comment: CRITICAL VALUE NOTED.  VALUE IS CONSISTENT WITH PREVIOUSLY REPORTED AND CALLED  VALUE. Performed at [facility], [place] [address] [place], [place] [address]   Hemoglobin A1c     Status: Abnormal   Collection Time: 06/24/20 12:48 AM  Result Value Ref Range   Hgb A1c MFr Bld 6.0 (H) 4.8 - 5.6 %    Comment: (NOTE) Pre diabetes:          5.7%-6.4%  Diabetes:              >6.4%  Glycemic control for   <7.0% adults with diabetes    Mean Plasma Glucose 125.5 mg/dL    Comment: Performed at [facility] [address], [place], [place] [address]  Magnesium     Status: None   Collection Time: 06/24/20 12:48 AM  Result Value Ref Range   Magnesium 2.0 1.7 - 2.4 mg/dL    Comment: Performed at [facility] [address], [place], [place] [address]  Basic metabolic panel     Status: Abnormal   Collection Time: 06/24/20 12:48 AM  Result Value Ref Range   Sodium 138 135 - 145 mmol/L   Potassium 2.6 (LL) 3.5 - 5.1 mmol/L    Comment: CRITICAL RESULT CALLED TO, READ BACK BY AND VERIFIED WITH: RN B OLDLAND @0159  06/24/20 BY S GEZAHEGN    Chloride 104 98 - 111 mmol/L   CO2 18 (L) 22 - 32 mmol/L   Glucose, Bld 211 (H) 70 - 99 mg/dL    Comment: Glucose reference range applies only to samples taken after fasting for at least 8 hours.   BUN 17 6 - 20 mg/dL   Creatinine, Ser 1.76 (H) 0.61 - 1.24 mg/dL   Calcium 9.1 8.9 - 10.3 mg/dL   GFR  calc non Af Amer 47 (L) >60 mL/min   GFR calc Af Amer 55 (L) >60 mL/min   Anion gap 16 (H) 5 - 15    Comment: Performed at [facility] [address] [place], [place] [address]  I-Stat arterial blood gas, ED     Status: Abnormal   Collection Time: 06/24/20  3:47 AM  Result Value Ref Range   pH, Arterial 7.359 7.35 - 7.45   pCO2 arterial 32.4 32 - 48 mmHg   pO2, Arterial 129 (H) 83 - 108 mmHg   Bicarbonate 18.2 (L) 20.0 - 28.0 mmol/L   TCO2 19 (L) 22 - 32 mmol/L   O2 Saturation 99.0 %   Acid-base deficit 6.0 (H) 0.0 - 2.0 mmol/L   Sodium 140 135 - 145 mmol/L   Potassium 3.0 (L) 3.5 - 5.1 mmol/L   Calcium,  Ion 1.20 1.15 - 1.40 mmol/L   HCT 43.0 39 - 52 %   Hemoglobin 14.6 13.0 - 17.0 g/dL   Patient temperature 99.5 F    Collection site Radial    Drawn by RT    Sample type ARTERIAL   CBG monitoring, ED     Status: Abnormal   Collection Time: 06/24/20  4:02 AM  Result Value Ref Range   Glucose-Capillary 112 (H) 70 - 99 mg/dL    Comment: Glucose reference range applies only to samples taken after fasting for at least 8 hours.  CBC     Status: Abnormal   Collection Time: 06/24/20  4:51 AM  Result Value Ref Range   WBC 13.7 (H) 4.0 - 10.5 K/uL   RBC 5.55 4.22 - 5.81 MIL/uL   Hemoglobin 14.1 13.0 - 17.0 g/dL   HCT 45.1 39 - 52 %   MCV 81.3 80.0 - 100.0 fL   MCH 25.4 (L) 26.0 - 34.0 pg   MCHC 31.3 30.0 - 36.0 g/dL   RDW 15.9 (H) 11.5 - 15.5 %   Platelets 258 150 - 400 K/uL   nRBC 0.0 0.0 - 0.2 %    Comment: Performed at [facility] [address], [place], [place] [address]  Basic metabolic panel     Status: Abnormal   Collection Time: 06/24/20  4:51 AM  Result Value Ref Range   Sodium 138 135 - 145 mmol/L   Potassium 5.3 (H) 3.5 - 5.1 mmol/L   Chloride 107 98 - 111 mmol/L   CO2 19 (L) 22 - 32 mmol/L   Glucose, Bld 106 (H) 70 - 99 mg/dL    Comment: Glucose reference range applies only to samples taken after fasting for at least 8 hours.   BUN 17 6 - 20 mg/dL   Creatinine, Ser 1.94 (H) 0.61 - 1.24 mg/dL   Calcium 8.7 (L) 8.9 - 10.3 mg/dL   GFR calc non Af Amer 42 (L) >60 mL/min   GFR calc Af Amer 49 (L) >60 mL/min   Anion gap 12 5 - 15    Comment: Performed at [facility] [address], [place], [place] [address]  Magnesium     Status: None   Collection Time: 06/24/20  4:51 AM  Result Value Ref Range   Magnesium 2.2 1.7 - 2.4 mg/dL    Comment: Performed at [facility] [address] [place], [place] [address]  Phosphorus     Status: Abnormal   Collection Time: 06/24/20  4:51 AM  Result Value Ref Range   Phosphorus 1.2 (  L) 2.5 - 4.6 mg/dL    Comment:  Performed at [facility], [place] [address], [place], [place] [address]  Lactic acid, plasma     Status: Abnormal   Collection Time: 06/24/20  7:23 AM  Result Value Ref Range   Lactic Acid, Venous 3.9 (HH) 0.5 - 1.9 mmol/L    Comment: CRITICAL VALUE NOTED.  VALUE IS CONSISTENT WITH PREVIOUSLY REPORTED AND CALLED VALUE. Performed at [facility], [place] [address] [place], [place] [address]   CBG monitoring, ED     Status: Abnormal   Collection Time: 06/24/20  8:00 AM  Result Value Ref Range   Glucose-Capillary 120 (H) 70 - 99 mg/dL    Comment: Glucose reference range applies only to samples taken after fasting for at least 8 hours.    CT Head Wo Contrast  Result Date: 06/23/2020 CLINICAL DATA:  Un witnessed fall, seizure, unresponsive, previous craniotomy EXAM: CT HEAD WITHOUT CONTRAST TECHNIQUE: Contiguous axial images were obtained from the base of the skull through the vertex without intravenous contrast. COMPARISON:  09/17/2019 FINDINGS: Brain: There is a small amount of acute hemorrhage within the left frontal cortex, measuring 9 mm in size. This is in an area of extensive encephalomalacia with extracranial herniation at the site of chronic craniectomy defect. Hypodensities are seen throughout the right-sided periventricular and subcortical white matter, slightly progressive since prior study but likely reflecting chronic small vessel ischemic change. There is ex vacuo dilatation of the left lateral ventricle due to large area of encephalomalacia from prior left basal ganglia hemorrhage. Remaining midline structures are unremarkable. Vascular: No hyperdense vessel or unexpected calcification. Skull: Large left frontotemporal craniectomy defect. No acute bony abnormalities. Sinuses/Orbits: Mucosal thickening is seen within the ethmoid air cells and right maxillary sinus. Remaining sinuses are clear. Other: None. IMPRESSION: 1. 9 mm area of acute intraparenchymal hemorrhage left frontal  cortex. 2. Extensive encephalomalacia throughout the left cerebral hemisphere related to sequela from previous intraparenchymal hemorrhage. 3. 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 4. Likely chronic small-vessel ischemic changes throughout the right-sided periventricular white matter, progressive since prior exam. These results were called by telephone at the time of interpretation on 06/23/2020 at 10:16 pm to provider [facility] , who verbally acknowledged these results. Electronically Signed   By: Randa Ngo M.D.   On: 06/23/2020 22:20   DG Chest Portable 1 View  Result Date: 06/23/2020 CLINICAL DATA:  Intubated EXAM: PORTABLE CHEST 1 VIEW COMPARISON:  09/28/2019 FINDINGS: Frontal view of the chest demonstrates endotracheal tube overlying tracheal air column tip at level of thoracic inlet. Enteric catheter passes below diaphragm tip excluded by collimation, side port projecting over gastric body. Cardiac silhouette is unremarkable. No airspace disease, effusion, or pneumothorax. No acute bony abnormalities. IMPRESSION: 1. Support devices as above. 2. No acute intrathoracic process. Electronically Signed   By: Randa Ngo M.D.   On: 06/23/2020 21:26   Overnight EEG with video  Result Date: 06/24/2020 Lora Havens, MD     06/24/2020  7:25 AM [name] MRN: [id number] Epilepsy Attending: Lora Havens Referring Physician/Provider: Elease Etienne, PA Duration: 06/23/2020 2319 to 06/24/2020 0720 Patient history: 39 year old male with status epilepticus and fall. CT head showed small left frontal IPH. EEG to evaluate for seizure Level of alertness: Comatose AEDs during EEG study: LEV Technical aspects: This EEG study was done with scalp electrodes positioned according to the 10-20 International system of electrode placement. Electrical activity was acquired  at a sampling rate of 500Hz  and reviewed with a high frequency filter of 70Hz  and a low  frequency filter of 1Hz . EEG data were recorded continuously and digitally stored. Description: EEG showed continuous generalized and maximal left frontal region 2-3Hz  delta slowing. Sharp waves were also seen in left frontal region. Hyperventilation and photic stimulation were not performed.   ABNORMALITY -Continuous slow, generalized and maximal left frontal region -Sharp wave, left frontal region IMPRESSION: This study showed evidence of potential epileptogenicity as well as cortical dysfunction in left frontal region likely secondary to underlying structural abnormality. Additionally, there is evidence of severe diffuse encephalopathy, non specific to etiology but could be related to sedation. No seizures were seen throughout the recording. Lora Havens    Review of Systems - Per HPI   Blood pressure 122/89, pulse (!) 130, temperature (!) 102.1 F (38.9 C), resp. rate 19, weight 63 kg, SpO2 100 %. Physical Exam: Patient is intubated and sedated on propofol gtt. He does not respond to verbal stimuli or deep sternal rub. No eye opening. PERRL 63mm and sluggish. He is localizing to stimuli in his LUE and intermittently reaches for his ETT. He has minimal withdrawal to noxious stimuli in his RUE and BLE with left greater than right. Corneal and cough reflex is intact.   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 Chronically enlarged left lateral ventricle is due to atrophy. Extensive encephalomalacia throughout the left cerebral hemisphere related to sequela from previous intraparenchymal hemorrhage.  It is difficult to discern between the seizure causing the fall or the fall causing the seizure.  Currently, I do not identify any emergent neurosurgical needs for the patient.  If the need arises (if family desires this to be done), we can replace his bone flap at his prior craniectomy  site. Continue supportive care.  Continue EG and antiepileptics per Neurology. Follow-up CT head in the morning. Call with any questions.  Peggyann Shoals, MD 06/24/2020, 9:12 AM

## 2020-06-24 NOTE — Progress Notes (Signed)
Stat LTM initiated. Dr. Leonel Ramsay and Dr Hortense Ramal notified. No skin breakdown noted.

## 2020-06-24 NOTE — Progress Notes (Signed)
LTM maint complete - no skin breakdown under: FP2,FP1,CZ,PZ

## 2020-06-24 NOTE — Progress Notes (Signed)
Patient transported from ED to room 5K35 without complications.

## 2020-06-24 NOTE — ED Notes (Signed)
CBG 181.  

## 2020-06-25 LAB — URINE CULTURE: Culture: NO GROWTH

## 2020-06-25 LAB — RENAL FUNCTION PANEL
Albumin: 2.6 g/dL — ABNORMAL LOW (ref 3.5–5.0)
Anion gap: 11 (ref 5–15)
BUN: 20 mg/dL (ref 6–20)
CO2: 21 mmol/L — ABNORMAL LOW (ref 22–32)
Calcium: 8.8 mg/dL — ABNORMAL LOW (ref 8.9–10.3)
Chloride: 108 mmol/L (ref 98–111)
Creatinine, Ser: 2.62 mg/dL — ABNORMAL HIGH (ref 0.61–1.24)
GFR calc Af Amer: 34 mL/min — ABNORMAL LOW (ref >60)
GFR calc non Af Amer: 29 mL/min — ABNORMAL LOW (ref >60)
Glucose, Bld: 99 mg/dL (ref 70–99)
Phosphorus: 3.6 mg/dL (ref 2.5–4.6)
Potassium: 3.9 mmol/L (ref 3.5–5.1)
Sodium: 140 mmol/L (ref 135–145)

## 2020-06-25 LAB — GLUCOSE, CAPILLARY
Glucose-Capillary: 110 mg/dL — ABNORMAL HIGH (ref 70–99)
Glucose-Capillary: 78 mg/dL (ref 70–99)
Glucose-Capillary: 89 mg/dL (ref 70–99)
Glucose-Capillary: 91 mg/dL (ref 70–99)
Glucose-Capillary: 91 mg/dL (ref 70–99)
Glucose-Capillary: 96 mg/dL (ref 70–99)

## 2020-06-25 LAB — CBC
HCT: 39.8 % (ref 39.0–52.0)
Hemoglobin: 12.8 g/dL — ABNORMAL LOW (ref 13.0–17.0)
MCH: 25.4 pg — ABNORMAL LOW (ref 26.0–34.0)
MCHC: 32.2 g/dL (ref 30.0–36.0)
MCV: 79.1 fL — ABNORMAL LOW (ref 80.0–100.0)
Platelets: 223 10*3/uL (ref 150–400)
RBC: 5.03 MIL/uL (ref 4.22–5.81)
RDW: 16.6 % — ABNORMAL HIGH (ref 11.5–15.5)
WBC: 13.7 10*3/uL — ABNORMAL HIGH (ref 4.0–10.5)
nRBC: 0 % (ref 0.0–0.2)

## 2020-06-25 MED ORDER — LEVETIRACETAM IN NACL 500 MG/100ML IV SOLN
500.0000 mg | Freq: Two times a day (BID) | INTRAVENOUS | Status: DC
  Administered 2020-06-25 – 2020-06-26 (×4): 500 mg via INTRAVENOUS
  Filled 2020-06-25 (×5): qty 100

## 2020-06-25 MED ORDER — DOCUSATE SODIUM 50 MG/5ML PO LIQD: 100.0000 mg | Freq: Two times a day (BID) | ORAL | Status: DC

## 2020-06-25 MED ORDER — SODIUM CHLORIDE 0.45 % IV SOLN
INTRAVENOUS | Status: DC
  Administered 2020-06-25: 50 mL/h via INTRAVENOUS

## 2020-06-25 MED ORDER — AMLODIPINE BESYLATE 5 MG PO TABS
5.0000 mg | ORAL_TABLET | Freq: Every day | ORAL | Status: DC
  Administered 2020-06-25: 5 mg via ORAL
  Filled 2020-06-25: qty 1

## 2020-06-25 MED ORDER — CLONIDINE HCL 0.1 MG/24HR TD PTWK
0.1000 mg | MEDICATED_PATCH | TRANSDERMAL | Status: DC
  Administered 2020-06-25: 0.1 mg via TRANSDERMAL
  Filled 2020-06-25: qty 1

## 2020-06-25 MED ORDER — LABETALOL HCL 5 MG/ML IV SOLN
5.0000 mg | INTRAVENOUS | Status: DC | PRN
  Administered 2020-06-25 – 2020-06-27 (×8): 5 mg via INTRAVENOUS
  Filled 2020-06-25 (×6): qty 4

## 2020-06-25 MED ORDER — PRO-STAT SUGAR FREE PO LIQD
30.0000 mL | Freq: Two times a day (BID) | ORAL | Status: DC
Start: 2020-06-25 — End: 2020-06-25

## 2020-06-25 MED ORDER — VITAL AF 1.2 CAL PO LIQD
1000.0000 mL | ORAL | Status: DC
  Administered 2020-06-25 – 2020-06-26 (×2): 1000 mL

## 2020-06-25 NOTE — Progress Notes (Addendum)
NAME:  Joshua [name], MRN:  [id number], DOB:  [birth year], LOS: 2 ADMISSION DATE:  06/23/2020, CONSULTATION DATE:  06/25/20 REFERRING MD:  EDP, CHIEF COMPLAINT:  seizure   Brief History   39 y.o. M with PMH of seizures on Keppra after previous ICH. Hemicraniectomy and prolonged hospital stay who presents from assisted living after breakthrough seizure activity and unwitnessed fall.  Intubated and found to have new L frontal ICH  History of present illness   Joshua [name] is a 39 y.o. M who suffered seizure and L hemispheric ICH in 08/2019 s/p L craniotomy and decompressive craniectomy with evacuation of ICH and prolonged hospital stay.  He was aphasic and nonverbal with right hemiplegia and significant cognitive impairment and was discharged to SNF on Keppra twice daily.   Per patient's brother, he has been at his baseline recently with no known illnesses or breakthrough seizures until today when he seized and had an unwitnessed fall possibly out of bed.  Patient had another seizure after EMS arrival with tonic-clonic movement.  On arrival to the ED he was intubated and started on propofol, neurology consulted.  A CT head was obtained left frontal ICH.  He had an episode of tachycardia in the ED which was treated with adenosine, pt was hypertensive and given Hydralazine.  Labs reveal elevated lactic acid of 7 and hypokalemia of 2.8. Patient's brother is at the bedside and confirms that he is full code and they would like aggressive care.   Past Medical History  [place] [facility] Events   6/28 Admit to PCCM  Consults:  Neurology  Procedures:  ETT 6/27-  Significant Diagnostic Tests:  06/25/20  CXR>> no acute findings 6/28 CT head>>9 mm area of acute intraparenchymal hemorrhage left frontal cortex. 6/28 Overnight EEG - Continuous slow, generalized and maximal left frontal region - Sharp wave, left frontal region  This study showed evidence of potential epileptogenicity as well  as cortical dysfunction in left frontal region likely secondary to underlying structural abnormality. Additionally, there is evidence of severe diffuse encephalopathy, non specific to etiology but could be related to sedation. No seizures were seen throughout the recording.  Micro Data:  6/28 blood cultures x2>> 6/27 SARS-Covid-2>> negative 6/27 urine culture>>  Antimicrobials:  Zosyn 6/27 Vancomycin 6/27 Unasyn 6/28>>   Interim history/subjective:  Off Cardene Net negative 875 cc T max 102.1 WBC 13.7 425 out OG tube Unasyn day 2 Tachy and hypertensive   Objective   Blood pressure (!) 136/114, pulse (!) 103, temperature 99.3 F (37.4 C), resp. rate 18, height 5\' 8"  (1.727 m), weight 64.5 kg, SpO2 100 %.    Vent Mode: PRVC FiO2 (%):  [50 %] 50 % Set Rate:  [18 bmp] 18 bmp Vt Set:  [600 mL] 600 mL PEEP:  [10 cmH20] 10 cmH20 Plateau Pressure:  [11 cmH20-29 cmH20] 25 cmH20   Intake/Output Summary (Last 24 hours) at 06/25/2020 0858 Last data filed at 06/25/2020 0656 Gross per 24 hour  Intake 1054.6 ml  Output 1930 ml  Net -875.4 ml   Filed Weights   06/23/20 2100 06/24/20 1232 06/25/20 0500  Weight: 63 kg 61.1 kg 64.5 kg    General:  Well nourished M, intubated and sedated, in NAD HEENT: NCAT, MM pink/moist. ETT secure and intact, OG tube secure and intact Neuro: awake , focuses , tracks at times, follows no commands CV: s1s2 tachycardic per tele, regular, no m/r/g PULM:  Bilateral chest excursion rhonchi, ETT in place on full vent  support GI: soft, bsx4 active  Extremities: warm/dry, muscle wasting without edema  Skin: no rashes or lesions, tattoos   Resolved Hospital Problem list     Assessment & Plan:   Acute L frontal ICH and breakthrough seizure activity with subsequent hypoxic respiratory failure in the setting of prior L hemispheric ICH s/p craniotomy and decompressive craniectomy Loaded with Keppra in the ED and evaluated by Neurology -LTM EEG -sedation  with propofol - Will add labetalol prn for BP and HR ( Goal BP < 315 systolic and HR > 400  -appreciate neurology recommendations, new ICH is small and near the site of previous craniectomy, so no indication for neurosurgery consult --Maintain full vent support with SAT/SBT as tolerated -titrate Vent setting to maintain SpO2 greater than or equal to 90%. -HOB elevated 30 degrees. -Plateau pressures less than 30 cm H20.  -Follow chest x-ray, ABG prn.   -Bronchial hygiene and RT/bronchodilator protocol.  Concern for Aspiration Event Continued Leukocytosis T max 102.1 Plan Trend WBC and Fever Curve  CXR in am Continue Unasyn for now  HTN BP elevated, HR elevated BP Goal is < 867 systolic Suspect will worsen once propofol is weaned off - Will add prn Lobetalol - Will start back Norvasc at 5 mg daily ( Half home dose)   Hypokalemia Possibly nutritional deficiency, pt has PEG on TF, given 39meq in the ED - Trend BMET and replete as needed - Will start trickle feeds and advance as tolerated   Chronic renal insufficiency Creatinine 1.65, appears this may be new baseline as consistent with renal function two months ago Has bumped to 2.6 on 6/29 Net negative Reynolds BMET daily Will start maintenance IVF at 37 Trickle feeds per Dietitian, advance as tolerated  Lactic acidosis Likely secondary to seizures, no evidence of infection on CXR, UA or by history Plan Trend for clearance  Best practice:  Diet: NPO, will add trickle feeds and advance as tolerated Pain/Anxiety/Delirium protocol (if indicated): Propofol and Fentanyl VAP protocol (if indicated): HOB 30 degrees, suction prn DVT prophylaxis: SCD's GI prophylaxis: protonix Glucose control: SSI Mobility: bed rest Code Status: full code Family Communication: Brother updated at the bedside Disposition: ICU  Labs   CBC: Recent Labs  Lab 06/23/20 2110 06/23/20 2129 06/24/20 0347 06/24/20 0451 06/25/20 0316   WBC 13.4*  --   --  13.7* 13.7*  HGB 13.1 16.0 14.6 14.1 12.8*  HCT 43.9 47.0 43.0 45.1 39.8  MCV 84.1  --   --  81.3 79.1*  PLT 243  --   --  258 619    Basic Metabolic Panel: Recent Labs  Lab 06/23/20 2110 06/23/20 2129 06/24/20 0048 06/24/20 0347 06/24/20 0451 06/24/20 1502 06/25/20 0316  NA 139   < > 138 140 138 138 140  K 2.8*   < > 2.6* 3.0* 5.3* 4.6 3.9  CL 105  --  104  --  107 108 108  CO2 20*  --  18*  --  19* 20* 21*  GLUCOSE 189*  --  211*  --  106* 110* 99  BUN 17  --  17  --  17 19 20   CREATININE 1.65*  --  1.76*  --  1.94* 2.46* 2.62*  CALCIUM 8.5*  --  9.1  --  8.7* 8.8* 8.8*  MG  --   --  2.0  --  2.2  --   --   PHOS  --   --   --   --  1.2*  --  3.6   < > = values in this interval not displayed.   GFR: Estimated Creatinine Clearance: 34.2 mL/min (A) (by C-G formula based on SCr of 2.62 mg/dL (H)). Recent Labs  Lab 06/23/20 2110 06/24/20 0048 06/24/20 0451 06/24/20 0723 06/25/20 0316  WBC 13.4*  --  13.7*  --  13.7*  LATICACIDVEN 7.3* 6.0*  --  3.9*  --     Liver Function Tests: Recent Labs  Lab 06/23/20 2110 06/25/20 0316  AST 20  --   ALT 9  --   ALKPHOS 103  --   BILITOT 0.4  --   PROT 6.7  --   ALBUMIN 3.1* 2.6*   No results for input(s): LIPASE, AMYLASE in the last 168 hours. No results for input(s): AMMONIA in the last 168 hours.  ABG    Component Value Date/Time   PHART 7.359 06/24/2020 0347   PCO2ART 32.4 06/24/2020 0347   PO2ART 129 (H) 06/24/2020 0347   HCO3 18.2 (L) 06/24/2020 0347   TCO2 19 (L) 06/24/2020 0347   ACIDBASEDEF 6.0 (H) 06/24/2020 0347   O2SAT 99.0 06/24/2020 0347     Coagulation Profile: No results for input(s): INR, PROTIME in the last 168 hours.  Cardiac Enzymes: Recent Labs  Lab 06/23/20 2110  CKTOTAL 90    HbA1C: Hgb A1c MFr Bld  Date/Time Value Ref Range Status  06/24/2020 12:48 AM 6.0 (H) 4.8 - 5.6 % Final    Comment:    (NOTE) Pre diabetes:          5.7%-6.4%  Diabetes:               >6.4%  Glycemic control for   <7.0% adults with diabetes   09/10/2019 12:56 AM 5.6 4.8 - 5.6 % Final    Comment:    (NOTE)         Prediabetes: 5.7 - 6.4         Diabetes: >6.4         Glycemic control for adults with diabetes: <7.0     CBG: Recent Labs  Lab 06/24/20 1516 06/24/20 1929 06/24/20 2315 06/25/20 0315 06/25/20 0719  GLUCAP 105* 95 101* 91 89    Review of Systems:   Unable to obtain secondary to mental status  Past Medical History  He,  has no past medical history on file.   Surgical History    Past Surgical History:  Procedure Laterality Date  . CRANIOTOMY Left 09/09/2019   Procedure: DECOMPRESSIVE CRANIECTOMY AND  HEMATOMA EVACUATION, IMPLANTATION OF SKULL FLAP IN RIGHT ABDOMEN;  Surgeon: Erline Levine, MD;  Location: [place];  Service: Neurosurgery;  Laterality: Left;  . ESOPHAGOGASTRODUODENOSCOPY (EGD) WITH PROPOFOL N/A 09/28/2019   Procedure: ESOPHAGOGASTRODUODENOSCOPY (EGD) WITH PROPOFOL;  Surgeon: Georganna Skeans, MD;  Location: [facility] ENDOSCOPY;  Service: General;  Laterality: N/A;  . PEG PLACEMENT N/A 09/28/2019   Procedure: PERCUTANEOUS ENDOSCOPIC GASTROSTOMY (PEG) PLACEMENT;  Surgeon: Georganna Skeans, MD;  Location: [place];  Service: General;  Laterality: N/A;     Social History   reports that he has quit smoking. He has quit using smokeless tobacco. He reports current alcohol use. He reports previous drug use.   Family History   His family history is not on file.   Allergies No Known Allergies   Home Medications  Prior to Admission medications   Medication Sig Start Date End Date Taking? Authorizing Provider  acetaminophen (TYLENOL) 325 MG tablet Place 2 tablets (650 mg total) into feeding  tube every 4 (four) hours as needed for mild pain (temp > 100.5). Patient taking differently: Place 650 mg into feeding tube See admin instructions. 650 mg per tube two times a day and an additional 650 mg every four hours as needed for fever or pain-  NOT TO EXCEED 4,000 MG/DAY FROM ALL COMBINED SOURCES 05/01/20  Yes Barb Merino, MD  amLODipine (NORVASC) 10 MG tablet Place 10 mg into feeding tube in the morning.   Yes [provider]  baclofen (LIORESAL) 10 MG tablet Place 10 mg into feeding tube at bedtime.   Yes [provider]  cloNIDine (CATAPRES - DOSED IN MG/24 HR) 0.1 mg/24hr patch Place 0.1 mg onto the skin every Friday.    Yes [provider]  levETIRAcetam (KEPPRA) 100 MG/ML solution Place 5 mLs (500 mg total) into feeding tube 2 (two) times daily. 05/01/20  Yes Barb Merino, MD  magnesium hydroxide (MILK OF MAGNESIA) 400 MG/5ML suspension Place 30 mLs into feeding tube every 12 (twelve) hours as needed for mild constipation ("for 48 hours").   Yes [provider]  NON FORMULARY Take by mouth See admin instructions. Magic Cup Frozen Desserts- One cup by mouth two times a day- with lunch and supper   Yes [provider]  polyethylene glycol (MIRALAX / GLYCOLAX) 17 g packet Place 17 g into feeding tube 2 (two) times daily. Patient taking differently: Place 17 g into feeding tube See admin instructions. Mix 17 grams into 4-8 ounces of water and administer, per tube, two times a day 05/01/20  Yes Ghimire, Dante Gang, MD  Water For Irrigation, Sterile (FREE WATER) SOLN Place 200 mLs into feeding tube every 4 (four) hours. Patient taking differently: Place 50-250 mLs into feeding tube See admin instructions. 50 ml's before and after each feeding and an additional 250 ml's every 6 hours for hydration 05/01/20  Yes Ghimire, Dante Gang, MD  Amino Acids-Protein Hydrolys (FEEDING SUPPLEMENT, PRO-STAT SUGAR FREE 64,) LIQD Place 30 mLs into feeding tube 4 (four) times daily. Patient not taking: Reported on 06/23/2020 05/01/20   Barb Merino, MD  Nutritional Supplements (FEEDING SUPPLEMENT, JEVITY 1.2 CAL,) LIQD Place 474 mLs into feeding tube in the morning, at noon, in the evening, and at bedtime. Patient not taking:  Reported on 06/23/2020 05/01/20   Barb Merino, MD     Critical care time:  minutes       Magdalen Spatz, MSN, AGACNP-BC [place] for personal pager and assignments PCCM on call pager [phone] 06/25/2020 8:59 AM

## 2020-06-25 NOTE — Procedures (Signed)
CPT/Type of Study: 81103; 24hr EEG with video Recording Date: 06/24/20 07:30 - 06/25/20 07:30  Interpreting physician: Izora Ribas, DO  History: This is a 39 year old patient, undergoing an EEG to evaluate for seizures.   Technical Description: The EEG was performed using standard setting per the guidelines of American Clinical Neurophysiology Society (ACNS).   A minimum of 21 electrodes were placed on scalp according to the International 10-20 or/and 10-10 Systems. Supplemental electrodes were placed as needed. Single EKG electrode was also used to detect cardiac arrhythmia. Patient's behavior was continuously recorded on video simultaneously with EEG. A minimum of 16 channels were used for data display. Each epoch of study was reviewed manually daily and as needed using standard referential and bipolar montages. Computerized quantitative EEG analysis (such as compressed spectral array analysis, trending, automated spike & seizure detection) were used as indicated.   Clinical State: Stupor Background: The posterior dominant rhythm was not recorded Overall Amplitude: Increased left frontal  Predominant Frequency: Delta and theta rhythms maximal left frontal  Asymmetry: Yes Sleep background: Normal sleep architecture was not seen  Abnormalities: Continuous slow generalized and regional left frontal  Rhythmic or periodic pattern: None  Epileptiform activity: Yes frequent sharp waves left frontal  Electrographic Seizure: No  Breach rhythm: Yes Reactivity: Yes  Stimulation procedures:  Hyperventilation: Not done Photic stimulation: Not done  Impression -Sharp wave, left frontal  -Continuous slow generalized and regional left frontal  This EEG shows evidence of cerebral dysfunction in the left frontal region with potential epileptogenicity and a severe diffuse encephalopathy. No EEG seizures were recorded.

## 2020-06-25 NOTE — Progress Notes (Signed)
D/C vLTM   No skin breakdown noted at d/c

## 2020-06-25 NOTE — Progress Notes (Signed)
Subjective: Patient reports on ventilator, not following commands, but does arouse to verbal stimuli. RN at bedside reporting no recent seizure activity and potential extubation today.   Objective: Vital signs in last 24 hours: Temp:  [98.6 F (37 C)-101.5 F (38.6 C)] 100.6 F (38.1 C) (06/29 1000) Pulse Rate:  [103-124] 110 (06/29 1000) Resp:  [15-26] 18 (06/29 1000) BP: (110-155)/(11-121) 147/114 (06/29 1000) SpO2:  [94 %-100 %] 97 % (06/29 1000) FiO2 (%):  [40 %-50 %] 40 % (06/29 1000) Weight:  [61.1 kg-64.5 kg] 64.5 kg (06/29 0500)  Intake/Output from previous day: 06/28 0701 - 06/29 0700 In: 1077.5 [I.V.:403.3; IV Piggyback:674.2] Out: 1930 [Urine:1505; Emesis/NG output:425] Intake/Output this shift: Total I/O In: 130.5 [I.V.:30.5; IV Piggyback:100] Out: 200 [Urine:200]  Physical Exam: Patient is intubated and sedated on propofol gtt. He responds to verbal stimuli with eye opening and eye contact but is not following any commands. However, he is not currently tracking as I walk around the room. PERRL 75mm and sluggish. Globally aphasic. He is localizing to stimuli in his LUE and intermittently reaches for his ETT. Bilateral safety mitts on and LUE non-violent restraint on for patient safety. He has minimal withdrawal to noxious stimuli in his RUE and BLE with left greater than right. Corneal and cough reflex is intact.   Lab Results: Recent Labs    06/24/20 0451 06/25/20 0316  WBC 13.7* 13.7*  HGB 14.1 12.8*  HCT 45.1 39.8  PLT 258 223   BMET Recent Labs    06/24/20 1502 06/25/20 0316  NA 138 140  K 4.6 3.9  CL 108 108  CO2 20* 21*  GLUCOSE 110* 99  BUN 19 20  CREATININE 2.46* 2.62*  CALCIUM 8.8* 8.8*    Studies/Results: CT Head Wo Contrast  Result Date: 06/23/2020 CLINICAL DATA:  Un witnessed fall, seizure, unresponsive, previous craniotomy EXAM: CT HEAD WITHOUT CONTRAST TECHNIQUE: Contiguous axial images were obtained from the base of the skull through  the vertex without intravenous contrast. COMPARISON:  09/17/2019 FINDINGS: Brain: There is a small amount of acute hemorrhage within the left frontal cortex, measuring 9 mm in size. This is in an area of extensive encephalomalacia with extracranial herniation at the site of chronic craniectomy defect. Hypodensities are seen throughout the right-sided periventricular and subcortical white matter, slightly progressive since prior study but likely reflecting chronic small vessel ischemic change. There is ex vacuo dilatation of the left lateral ventricle due to large area of encephalomalacia from prior left basal ganglia hemorrhage. Remaining midline structures are unremarkable. Vascular: No hyperdense vessel or unexpected calcification. Skull: Large left frontotemporal craniectomy defect. No acute bony abnormalities. Sinuses/Orbits: Mucosal thickening is seen within the ethmoid air cells and right maxillary sinus. Remaining sinuses are clear. Other: None. IMPRESSION: 1. 9 mm area of acute intraparenchymal hemorrhage left frontal cortex. 2. Extensive encephalomalacia throughout the left cerebral hemisphere related to sequela from previous intraparenchymal hemorrhage. 3. 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 4. Likely chronic small-vessel ischemic changes throughout the right-sided periventricular white matter, progressive since prior exam. These results were called by telephone at the time of interpretation on 06/23/2020 at 10:16 pm to provider [facility] , who verbally acknowledged these results. Electronically Signed   By: Randa Ngo M.D.   On: 06/23/2020 22:20   DG Chest Portable 1 View  Result Date: 06/23/2020 CLINICAL DATA:  Intubated EXAM: PORTABLE CHEST 1 VIEW COMPARISON:  09/28/2019 FINDINGS: Frontal view of the chest demonstrates endotracheal tube overlying tracheal air  column tip at level of thoracic inlet. Enteric catheter passes below diaphragm tip excluded  by collimation, side port projecting over gastric body. Cardiac silhouette is unremarkable. No airspace disease, effusion, or pneumothorax. No acute bony abnormalities. IMPRESSION: 1. Support devices as above. 2. No acute intrathoracic process. Electronically Signed   By: Randa Ngo M.D.   On: 06/23/2020 21:26   Overnight EEG with video  Result Date: 06/24/2020 Lora Havens, MD     06/24/2020  7:25 AM [name] MRN: [id number] Epilepsy Attending: Lora Havens Referring Physician/Provider: Elease Etienne, PA Duration: 06/23/2020 2319 to 06/24/2020 0720 Patient history: 39 year old male with status epilepticus and fall. CT head showed small left frontal IPH. EEG to evaluate for seizure Level of alertness: Comatose AEDs during EEG study: LEV Technical aspects: This EEG study was done with scalp electrodes positioned according to the 10-20 International system of electrode placement. Electrical activity was acquired at a sampling rate of 500Hz  and reviewed with a high frequency filter of 70Hz  and a low frequency filter of 1Hz . EEG data were recorded continuously and digitally stored. Description: EEG showed continuous generalized and maximal left frontal region 2-3Hz  delta slowing. Sharp waves were also seen in left frontal region. Hyperventilation and photic stimulation were not performed.   ABNORMALITY -Continuous slow, generalized and maximal left frontal region -Sharp wave, left frontal region IMPRESSION: This study showed evidence of potential epileptogenicity as well as cortical dysfunction in left frontal region likely secondary to underlying structural abnormality. Additionally, there is evidence of severe diffuse encephalopathy, non specific to etiology but could be related to sedation. No seizures were seen throughout the recording. Priyanka Barbra Sarks    Assessment/Plan: Currently, I do not identify any emergent neurosurgical needs for the patient. I attempted to reach out to  the patient's mother today to discuss goals of care and received no answer. If the need arises (if family desires this to be done), we can replace his bone flap at his prior craniectomy site. Continue supportive care.  Continue EEG and antiepileptics per Neurology. Call with any questions.    LOS: 2 days    Peggyann Shoals, MD 06/25/2020, 11:28 AM

## 2020-06-25 NOTE — Progress Notes (Addendum)
Initial Nutrition Assessment  DOCUMENTATION CODES:   Not applicable  INTERVENTION:   Initiate tube feeding via PEG: Vital AF 1.2 at 20 ml/h, increase by 10 ml every 4 hours to goal rate of 70 ml/h (1680 ml per day)  Provides 2016 kcal, 126 gm protein, 1362 ml free water daily  NUTRITION DIAGNOSIS:   Inadequate oral intake related to inability to eat as evidenced by NPO status.  GOAL:   Patient will meet greater than or equal to 90% of their needs  MONITOR:   Vent status, TF tolerance, Labs  REASON FOR ASSESSMENT:   Consult Enteral/tube feeding initiation and management  ASSESSMENT:   39 yo male admitted with seizure activity and unwitnessed fall. Found to have new L frontal ICH. PMH includes ICH (08/2019), seizures, PEG.  EEG ongoing to evaluate for seizures.  OGT output 425 ml, now clamped. Patient has PEG, RD to begin trickle tube feeding via PEG and advance as tolerated. Received MD Consult for TF initiation and management.  Per home medication list, patient was taking POs PTA. He was no longer receiving TF via PEG.   Patient is currently intubated on ventilator support. MV: 10.2 L/min Temp (24hrs), Avg:100.2 F (37.9 C), Min:98.6 F (37 C), Max:101.5 F (38.6 C)  Propofol: 3.8 ml/hr providing 100 kcal from lipid  Labs reviewed. Creat 2.62 (H) CBG: 91-89-91  Medications reviewed and include Colace, Novolog, Miralax, Keppra, Propofol.  No weight history available; suspect patient has lost muscle mass related to sequelae from [place] in September 2020.  NUTRITION - FOCUSED PHYSICAL EXAM:    Most Recent Value  Orbital Region Unable to assess  Upper Arm Region No depletion  Thoracic and Lumbar Region Unable to assess  Buccal Region Unable to assess  Temple Region Unable to assess  Clavicle Bone Region Moderate depletion  Clavicle and Acromion Bone Region Moderate depletion  Scapular Bone Region Unable to assess  Dorsal Hand Unable to assess  Patellar  Region Severe depletion  Anterior Thigh Region Severe depletion  Posterior Calf Region Severe depletion  Edema (RD Assessment) Mild  Hair Unable to assess  Eyes Unable to assess  Mouth Unable to assess  Skin Reviewed  Nails Reviewed       Diet Order:   Diet Order            Diet NPO time specified  Diet effective now                 EDUCATION NEEDS:   No education needs have been identified at this time  Skin:  Skin Assessment: Reviewed RN Assessment  Last BM:  no BM documented  Height:   Ht Readings from Last 1 Encounters:  06/24/20 5\' 8"  (1.727 m)    Weight:   Wt Readings from Last 1 Encounters:  06/25/20 64.5 kg    Ideal Body Weight:  70 kg  BMI:  Body mass index is 21.62 kg/m.  Estimated Nutritional Needs:   Kcal:  2020  Protein:  95-120 gm  Fluid:  >/= 2 L    Lucas Mallow, RD, LDN, CNSC Please refer to Amion for contact information.

## 2020-06-25 NOTE — Progress Notes (Addendum)
NEUROLOGY PROGRESS NOTE   Subjective: Patient currently breathing over the ventilator.  Follows no commands at this point in time.  Exam: Vitals:   06/25/20 0746 06/25/20 0900  BP:  (!) 143/119  Pulse:  (!) 116  Resp:  19  Temp:  100.2 F (37.9 C)  SpO2: 100% 94%     Neuro:  Mental Status: Patient currently intubated.  Does not follow commands. Will look toward voice. Cranial Nerves: II: Blinks to threat. III,IV, VI: EOMI. PERRL V,VII: Intubated, difficult to assess VIII: Looks toward voice Motor: Right arm and leg significantly spastic, moving left arm and leg spontaneously Sensory: Response to noxious stimuli on left arm and leg Plantars: Mute bilaterally     Medications:  Prior to Admission:  Medications Prior to Admission  Medication Sig Dispense Refill Last Dose  . acetaminophen (TYLENOL) 325 MG tablet Place 2 tablets (650 mg total) into feeding tube every 4 (four) hours as needed for mild pain (temp > 100.5). (Patient taking differently: Place 650 mg into feeding tube See admin instructions. 650 mg per tube two times a day and an additional 650 mg every four hours as needed for fever or pain- NOT TO EXCEED 4,000 MG/DAY FROM ALL COMBINED SOURCES)   06/23/2020 at 0900  . amLODipine (NORVASC) 10 MG tablet Place 10 mg into feeding tube in the morning.   06/23/2020 at 0900  . baclofen (LIORESAL) 10 MG tablet Place 10 mg into feeding tube at bedtime.   06/20/2020 at 2100  . cloNIDine (CATAPRES - DOSED IN MG/24 HR) 0.1 mg/24hr patch Place 0.1 mg onto the skin every Friday.    06/21/2020 at 0900  . levETIRAcetam (KEPPRA) 100 MG/ML solution Place 5 mLs (500 mg total) into feeding tube 2 (two) times daily. 473 mL 12 06/23/2020 at 0900  . magnesium hydroxide (MILK OF MAGNESIA) 400 MG/5ML suspension Place 30 mLs into feeding tube every 12 (twelve) hours as needed for mild constipation ("for 48 hours").   06/03/2020 at Unknown time  . NON FORMULARY Take by mouth See admin instructions.  Magic Cup Frozen Desserts- One cup by mouth two times a day- with lunch and supper   06/22/2020 at 1200  . polyethylene glycol (MIRALAX / GLYCOLAX) 17 g packet Place 17 g into feeding tube 2 (two) times daily. (Patient taking differently: Place 17 g into feeding tube See admin instructions. Mix 17 grams into 4-8 ounces of water and administer, per tube, two times a day) 14 each 0 06/22/2020 at 0900  . Water For Irrigation, Sterile (FREE WATER) SOLN Place 200 mLs into feeding tube every 4 (four) hours. (Patient taking differently: Place 50-250 mLs into feeding tube See admin instructions. 50 ml's before and after each feeding and an additional 250 ml's every 6 hours for hydration)   06/23/2020 at 1200  . Amino Acids-Protein Hydrolys (FEEDING SUPPLEMENT, PRO-STAT SUGAR FREE 64,) LIQD Place 30 mLs into feeding tube 4 (four) times daily. (Patient not taking: Reported on 06/23/2020) 887 mL 0 Not Taking at Unknown time  . Nutritional Supplements (FEEDING SUPPLEMENT, JEVITY 1.2 CAL,) LIQD Place 474 mLs into feeding tube in the morning, at noon, in the evening, and at bedtime. (Patient not taking: Reported on 06/23/2020)  0 Not Taking at Unknown time   Scheduled: . chlorhexidine gluconate (MEDLINE KIT)  15 mL Mouth Rinse BID  . Chlorhexidine Gluconate Cloth  6 each Topical Daily  . docusate  100 mg Oral BID  . insulin aspart  0-9 Units Subcutaneous Q4H  .  LORazepam  1 mg Intravenous Once  . mouth rinse  15 mL Mouth Rinse 10 times per day  . pantoprazole (PROTONIX) IV  40 mg Intravenous QHS  . polyethylene glycol  17 g Oral Daily   Continuous: . sodium chloride 10 mL/hr at 06/25/20 0600  . ampicillin-sulbactam (UNASYN) IV 3 g (06/25/20 0903)  . levETIRAcetam    . niCARDipine Stopped (06/24/20 0752)  . propofol (DIPRIVAN) infusion 10 mcg/kg/min (06/25/20 0830)   HWY:SHUOHF chloride, acetaminophen, docusate sodium, fentaNYL (SUBLIMAZE) injection, fentaNYL (SUBLIMAZE) injection, polyethylene  glycol  Pertinent Labs/Diagnostics: Creatinine has been trending up from 1.94 on 6/28-2.62 on 6/29  EEG 06/25/2020- Impression -Sharp wave, left frontal  -Continuous slow generalized and regional left frontal. This EEG shows evidence of cerebral dysfunction in the left frontal region with potential epileptogenicity and a severe diffuse encephalopathy. No EEG seizures were recorded.  CT Head Wo Contrast Result Date: 06/23/2020  IMPRESSION: 1. 9 mm area of acute intraparenchymal hemorrhage left frontal cortex. 2. Extensive encephalomalacia throughout the left cerebral hemisphere related to sequela from previous intraparenchymal hemorrhage. 3. 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 4. Likely chronic small-vessel ischemic changes throughout the right-sided periventricular white matter, progressive since prior exam. These results were called by telephone at the time of interpretation on 06/23/2020 at 10:16 pm to provider [facility] , who verbally acknowledged these results. Electronically Signed   By: Randa Ngo M.D.   On: 06/23/2020 22:20    Overnight EEG with video Result Date: 06/24/2020 IMPRESSION: This study showed evidence of potential epileptogenicity as well as cortical dysfunction in left frontal region likely secondary to underlying structural abnormality. Additionally, there is evidence of severe diffuse encephalopathy, non specific to etiology but could be related to sedation. No seizures were seen throughout the recording. Priyanka Cipriano Mile PA-C [facility] [phone]  Assessment: 39 year old male recently discharged after 235 day stay for left frontal ICH and left hemicraniectomy, bone flap not yet replaced, who presented with status epilepticus and fall. Suspect that the small IPH is likely secondary to fall, unclear if the fall precipitated the breakthrough seizure or breakthrough seizure was caused by the  fall. 1. Currently hooked up to LTM EEG. No electrographic seizures seen. However, there is evidence of potential epileptogenicity as well as cortical dysfunction in left frontal region likely secondary to underlying structural abnormality. Additionally, there is evidence of severe diffuse encephalopathy, 2. Currently intubated. On propofol. Not following commands but will blink to threat, look towards voice and move his LUE and LLE spontaneously.  3. AKI with increasing Cr   Recommendations: -Decrease Keppra to 500 mg twice daily due to worsened renal function.  Once patient's renal function improves, increase Keppra to 750 mg BID.  --Wean off propofol and ventilator. Extubate as soon as he can tolerate. --Discontinuing LTM EEG --Neurology will sign off. Please call if seizures recur or if there are any additional questions.   Electronically signed: Dr. Kerney Elbe 06/25/2020, 9:31 AM

## 2020-06-25 NOTE — Progress Notes (Signed)
LTM miantenance completed; no skin breakdown was seen.

## 2020-06-26 ENCOUNTER — Inpatient Hospital Stay (HOSPITAL_COMMUNITY): Payer: Medicaid Other

## 2020-06-26 LAB — GLUCOSE, CAPILLARY
Glucose-Capillary: 121 mg/dL — ABNORMAL HIGH (ref 70–99)
Glucose-Capillary: 121 mg/dL — ABNORMAL HIGH (ref 70–99)
Glucose-Capillary: 109 mg/dL — ABNORMAL HIGH (ref 70–99)
Glucose-Capillary: 123 mg/dL — ABNORMAL HIGH (ref 70–99)
Glucose-Capillary: 108 mg/dL — ABNORMAL HIGH (ref 70–99)
Glucose-Capillary: 128 mg/dL — ABNORMAL HIGH (ref 70–99)

## 2020-06-26 LAB — CBC
HCT: 33.6 % — ABNORMAL LOW (ref 39.0–52.0)
Hemoglobin: 10.5 g/dL — ABNORMAL LOW (ref 13.0–17.0)
MCH: 25 pg — ABNORMAL LOW (ref 26.0–34.0)
MCHC: 31.3 g/dL (ref 30.0–36.0)
MCV: 80 fL (ref 80.0–100.0)
Platelets: 177 10*3/uL (ref 150–400)
RBC: 4.2 MIL/uL — ABNORMAL LOW (ref 4.22–5.81)
RDW: 16.8 % — ABNORMAL HIGH (ref 11.5–15.5)
WBC: 11.9 10*3/uL — ABNORMAL HIGH (ref 4.0–10.5)
nRBC: 0 % (ref 0.0–0.2)

## 2020-06-26 LAB — BASIC METABOLIC PANEL
Anion gap: 11 (ref 5–15)
BUN: 21 mg/dL — ABNORMAL HIGH (ref 6–20)
CO2: 20 mmol/L — ABNORMAL LOW (ref 22–32)
Calcium: 8.5 mg/dL — ABNORMAL LOW (ref 8.9–10.3)
Chloride: 112 mmol/L — ABNORMAL HIGH (ref 98–111)
Creatinine, Ser: 2.51 mg/dL — ABNORMAL HIGH (ref 0.61–1.24)
GFR calc Af Amer: 36 mL/min — ABNORMAL LOW (ref >60)
GFR calc non Af Amer: 31 mL/min — ABNORMAL LOW (ref >60)
Glucose, Bld: 114 mg/dL — ABNORMAL HIGH (ref 70–99)
Potassium: 3.1 mmol/L — ABNORMAL LOW (ref 3.5–5.1)
Sodium: 143 mmol/L (ref 135–145)

## 2020-06-26 LAB — MAGNESIUM: Magnesium: 2.1 mg/dL (ref 1.7–2.4)

## 2020-06-26 MED ORDER — CLONIDINE HCL 0.2 MG PO TABS
0.1000 mg | ORAL_TABLET | Freq: Once | ORAL | Status: AC
  Administered 2020-06-26: 0.1 mg
  Filled 2020-06-26: qty 1

## 2020-06-26 MED ORDER — JEVITY 1.2 CAL PO LIQD
1000.0000 mL | ORAL | Status: DC
  Administered 2020-06-26 – 2020-06-28 (×3): 1000 mL
  Filled 2020-06-26 (×4): qty 1000

## 2020-06-26 MED ORDER — CHLORHEXIDINE GLUCONATE 0.12 % MT SOLN
15.0000 mL | Freq: Two times a day (BID) | OROMUCOSAL | Status: DC
  Administered 2020-06-27 – 2020-07-02 (×12): 15 mL via OROMUCOSAL
  Filled 2020-06-26 (×12): qty 15

## 2020-06-26 MED ORDER — POTASSIUM CHLORIDE 20 MEQ/15ML (10%) PO SOLN
30.0000 meq | ORAL | Status: AC
  Administered 2020-06-26 (×2): 30 meq
  Filled 2020-06-26 (×2): qty 30

## 2020-06-26 MED ORDER — AMLODIPINE BESYLATE 10 MG PO TABS
10.0000 mg | ORAL_TABLET | Freq: Every day | ORAL | Status: DC
  Administered 2020-06-26: 10 mg via ORAL
  Filled 2020-06-26: qty 1

## 2020-06-26 MED ORDER — CLONIDINE HCL 0.3 MG/24HR TD PTWK
0.3000 mg | MEDICATED_PATCH | TRANSDERMAL | Status: DC
  Administered 2020-06-26 – 2020-07-04 (×2): 0.3 mg via TRANSDERMAL
  Filled 2020-06-26 (×2): qty 1

## 2020-06-26 MED ORDER — ORAL CARE MOUTH RINSE
15.0000 mL | Freq: Two times a day (BID) | OROMUCOSAL | Status: DC
  Administered 2020-06-27 – 2020-07-04 (×12): 15 mL via OROMUCOSAL

## 2020-06-26 NOTE — Procedures (Signed)
CPT/Type of Study: 95718: EEG w/video 2-12hr Recording Date:  06/25/20 07:30 - 15:45  Interpreting physician: Izora Ribas, DO  History: This is a 39 year old patient, undergoing an EEG to evaluate for seizures.   Technical Description: The EEG was performed using standard setting per the guidelines of American Clinical Neurophysiology Society (ACNS).   A minimum of 21 electrodes were placed on scalp according to the International 10-20 or/and 10-10 Systems. Supplemental electrodes were placed as needed. Single EKG electrode was also used to detect cardiac arrhythmia. Patient's behavior was continuously recorded on video simultaneously with EEG. A minimum of 16 channels were used for data display. Each epoch of study was reviewed manually daily and as needed using standard referential and bipolar montages. Computerized quantitative EEG analysis (such as compressed spectral array analysis, trending, automated spike & seizure detection) were used as indicated.   Clinical State: Stupor Background: The posterior dominant rhythm was not recorded Overall Amplitude: Increased left frontal  Predominant Frequency: Delta and theta rhythms maximal left frontal  Asymmetry: Yes Sleep background: Normal sleep architecture was not seen  Abnormalities: Continuous slow generalized and regional left frontal  Rhythmic or periodic pattern: None  Epileptiform activity: Yes frequent sharp waves left frontal  Electrographic Seizure: No  Breach rhythm: Yes Reactivity: Yes  Stimulation procedures:  Hyperventilation: Not done Photic stimulation: Not done  Impression -Sharp wave, left frontal  -Continuous slow generalized and regional left frontal  This EEG shows evidence of cerebral dysfunction in the left frontal region with potential epileptogenicity and a severe diffuse encephalopathy. No EEG seizures were recorded.

## 2020-06-26 NOTE — Progress Notes (Signed)
NAME:  Joshua [name], MRN:  [id number], DOB:  1981-06-01, LOS: 3 ADMISSION DATE:  06/23/2020, CONSULTATION DATE:  06/26/20 REFERRING MD:  EDP, CHIEF COMPLAINT:  seizure   Brief History   39 y.o. M with PMH of seizures on Keppra after previous ICH. Hemicraniectomy and prolonged hospital stay who presents from assisted living after breakthrough seizure activity and unwitnessed fall.  Intubated and found to have new L frontal ICH  History of present illness   Kirke Breach is a 39 y.o. M who suffered seizure and L hemispheric ICH in 08/2019 s/p L craniotomy and decompressive craniectomy with evacuation of ICH and prolonged hospital stay.  He was aphasic and nonverbal with right hemiplegia and significant cognitive impairment and was discharged to SNF on Keppra twice daily.   Per patient's brother, he has been at his baseline recently with no known illnesses or breakthrough seizures until today when he seized and had an unwitnessed fall possibly out of bed.  Patient had another seizure after EMS arrival with tonic-clonic movement.  On arrival to the ED he was intubated and started on propofol, neurology consulted.  A CT head was obtained left frontal ICH.  He had an episode of tachycardia in the ED which was treated with adenosine, pt was hypertensive and given Hydralazine.  Labs reveal elevated lactic acid of 7 and hypokalemia of 2.8. Patient's brother is at the bedside and confirms that he is full code and they would like aggressive care.   Past Medical History  [place] [facility] Events   6/28 Admit to PCCM  Consults:  Neurology  Procedures:  ETT 6/27-  Significant Diagnostic Tests:  06/26/20  CXR>> no acute findings 6/28 CT head>>9 mm area of acute intraparenchymal hemorrhage left frontal cortex. 6/28 Overnight EEG - Continuous slow, generalized and maximal left frontal region - Sharp wave, left frontal region  This study showed evidence of potential epileptogenicity as well  as cortical dysfunction in left frontal region likely secondary to underlying structural abnormality. Additionally, there is evidence of severe diffuse encephalopathy, non specific to etiology but could be related to sedation. No seizures were seen throughout the recording.  Micro Data:  6/28 blood cultures x2>> 6/27 SARS-Covid-2>> negative 6/27 urine culture>>  Antimicrobials:  Zosyn 6/27 Vancomycin 6/27 Unasyn 6/28>> 6/30  Interim history/subjective:   Remains hypertensive Slight improvement in serum creatinine, 2.5 to I/O+ 400 cc total, urine output 1100 cc last 24 hours Drop platelets 223 >> 177 Potassium 3.1 Propofol stopped this morning and preparing to start SBT   Objective   Blood pressure (!) 149/116, pulse 93, temperature 99.1 F (37.3 C), resp. rate 18, height 5\' 8"  (1.727 m), weight 62.1 kg, SpO2 100 %.    Vent Mode: PRVC FiO2 (%):  [40 %] 40 % Set Rate:  [18 bmp] 18 bmp Vt Set:  [600 mL] 600 mL PEEP:  [8 cmH20] 8 cmH20 Plateau Pressure:  [22 cmH20-26 cmH20] 22 cmH20   Intake/Output Summary (Last 24 hours) at 06/26/2020 0834 Last data filed at 06/26/2020 0600 Gross per 24 hour  Intake 2355.65 ml  Output 1125 ml  Net 1230.65 ml   Filed Weights   06/24/20 1232 06/25/20 0500 06/26/20 0257  Weight: 61.1 kg 64.5 kg 62.1 kg    General: Thin gentleman, intubated and ventilated HEENT: ET tube in good position, no oral lesions, OG tube in place Neuro: Awake, will focus and track, did follow commands on the left.  Strong upper extremity, weak lower extremity CV:  Regular, distant, no murmur, hypertensive PULM: Clear chest, no wheeze, no crackles GI: Nondistended, positive bowel sounds Extremities: Bilateral chronic muscle wasting, no edema Skin: No rash   Resolved Hospital Problem list     Assessment & Plan:   Acute L frontal ICH and breakthrough seizure activity with subsequent hypoxic respiratory failure in the setting of prior L hemispheric ICH s/p  craniotomy and decompressive craniectomy Loaded with Keppra in the ED and evaluated by Neurology Continuous EEG monitoring discontinued Appreciate neurology evaluation Following clinically.  Reimage if change in mental status to evaluate for evolving small ICH.  No role for neurosurgery at this time  Acute respiratory failure, ventilator dependence due to [place], poor airway protection Lighten sedation and push for SBT today 6/30, goal extubation Pulmonary hygiene Some risk for possible aspiration, see below   Concern for Aspiration Event Continued Leukocytosis T max 102.1 Plan No over infiltrate chest x-ray 6/30, WBC normalizing Completes day 3 of Unasyn today 6/30 Plan to stop Unasyn today and follow clinically  HTN BP elevated, HR elevated BP Goal is < 144 systolic Suspect will worsen as propofol is weaned off Plan increase his amlodipine 6/30 Clonidine patch added back on 6/29 Labetalol as needed   Hypokalemia Possibly nutritional deficiency, pt has PEG on TF, given 81meq in the ED Continue to follow BMP, replace as indicated   Acute on chronic renal insufficiency Creatinine 1.65, appears this may be new baseline as consistent with renal function two months ago Plan Continue to follow BMP, urine output Maintenance 0.45 NS at 50 cc/h until nutrition reinitiated  Lactic acidosis Likely secondary to seizures, no evidence of infection on CXR, UA or by history Plan Cleared, resolved  Best practice:  Diet: TF Pain/Anxiety/Delirium protocol (if indicated): Propofol and Fentanyl VAP protocol (if indicated): HOB 30 degrees, suction prn DVT prophylaxis: SCD's GI prophylaxis: protonix Glucose control: SSI Mobility: bed rest Code Status: full code Family Communication: Brother updated at the bedside 6/29 Disposition: ICU  Labs   CBC: Recent Labs  Lab 06/23/20 2110 06/23/20 2110 06/23/20 2129 06/24/20 0347 06/24/20 0451 06/25/20 0316 06/26/20 0303  WBC 13.4*   --   --   --  13.7* 13.7* 11.9*  HGB 13.1   < > 16.0 14.6 14.1 12.8* 10.5*  HCT 43.9   < > 47.0 43.0 45.1 39.8 33.6*  MCV 84.1  --   --   --  81.3 79.1* 80.0  PLT 243  --   --   --  258 223 177   < > = values in this interval not displayed.    Basic Metabolic Panel: Recent Labs  Lab 06/24/20 0048 06/24/20 0048 06/24/20 0347 06/24/20 0451 06/24/20 1502 06/25/20 0316 06/26/20 0303  NA 138   < > 140 138 138 140 143  K 2.6*   < > 3.0* 5.3* 4.6 3.9 3.1*  CL 104  --   --  107 108 108 112*  CO2 18*  --   --  19* 20* 21* 20*  GLUCOSE 211*  --   --  106* 110* 99 114*  BUN 17  --   --  17 19 20  21*  CREATININE 1.76*  --   --  1.94* 2.46* 2.62* 2.51*  CALCIUM 9.1  --   --  8.7* 8.8* 8.8* 8.5*  MG 2.0  --   --  2.2  --   --  2.1  PHOS  --   --   --  1.2*  --  3.6  --    < > = values in this interval not displayed.   GFR: Estimated Creatinine Clearance: 34.4 mL/min (A) (by C-G formula based on SCr of 2.51 mg/dL (H)). Recent Labs  Lab 06/23/20 2110 06/24/20 0048 06/24/20 0451 06/24/20 0723 06/25/20 0316 06/26/20 0303  WBC 13.4*  --  13.7*  --  13.7* 11.9*  LATICACIDVEN 7.3* 6.0*  --  3.9*  --   --     Liver Function Tests: Recent Labs  Lab 06/23/20 2110 06/25/20 0316  AST 20  --   ALT 9  --   ALKPHOS 103  --   BILITOT 0.4  --   PROT 6.7  --   ALBUMIN 3.1* 2.6*   No results for input(s): LIPASE, AMYLASE in the last 168 hours. No results for input(s): AMMONIA in the last 168 hours.  ABG    Component Value Date/Time   PHART 7.359 06/24/2020 0347   PCO2ART 32.4 06/24/2020 0347   PO2ART 129 (H) 06/24/2020 0347   HCO3 18.2 (L) 06/24/2020 0347   TCO2 19 (L) 06/24/2020 0347   ACIDBASEDEF 6.0 (H) 06/24/2020 0347   O2SAT 99.0 06/24/2020 0347     Coagulation Profile: No results for input(s): INR, PROTIME in the last 168 hours.  Cardiac Enzymes: Recent Labs  Lab 06/23/20 2110  CKTOTAL 90    HbA1C: Hgb A1c MFr Bld  Date/Time Value Ref Range Status  06/24/2020  12:48 AM 6.0 (H) 4.8 - 5.6 % Final    Comment:    (NOTE) Pre diabetes:          5.7%-6.4%  Diabetes:              >6.4%  Glycemic control for   <7.0% adults with diabetes   09/10/2019 12:56 AM 5.6 4.8 - 5.6 % Final    Comment:    (NOTE)         Prediabetes: 5.7 - 6.4         Diabetes: >6.4         Glycemic control for adults with diabetes: <7.0     CBG: Recent Labs  Lab 06/25/20 1518 06/25/20 1948 06/25/20 2353 06/26/20 0331 06/26/20 0714  GLUCAP 78 96 110* 121* 121*     Critical care time:  minutes       Magdalen Spatz, MSN, AGACNP-BC [place] for personal pager and assignments PCCM on call pager [phone] 06/26/2020 8:34 AM

## 2020-06-26 NOTE — Progress Notes (Signed)
Initial Nutrition Assessment  DOCUMENTATION CODES:   Not applicable  INTERVENTION:   Continue tube feeding via PEG:  Change to Jevity 1.2 at 75 ml/h (1800 ml per day)  Provides 2160 kcal, 100 gm protein, 1458 ml free water daily  NUTRITION DIAGNOSIS:   Inadequate oral intake related to inability to eat as evidenced by NPO status.  Ongoing  GOAL:   Patient will meet greater than or equal to 90% of their needs   Met with TF  MONITOR:   Diet advancement, TF tolerance, Labs  REASON FOR ASSESSMENT:   Consult Enteral/tube feeding initiation and management  ASSESSMENT:   40 yo male admitted with seizure activity and unwitnessed fall. Found to have new L frontal ICH. PMH includes ICH (08/2019), seizures, PEG.  Discussed patient in ICU rounds and with RN today. Patient was extubated this morning. Plans to continue TF via PEG for now. Will change to a standard formula. Patient has a hx of dysphagia. He was taking POs PTA, no longer using his PEG for nutrition. He needs a swallow evaluation with SLP prior to beginning PO diet.  Labs reviewed. K 3.1 CBG: 121-121-109  Medications reviewed and include Colace, Novolog, Miralax, Keppra.   Diet Order:   Diet Order            Diet NPO time specified  Diet effective now                 EDUCATION NEEDS:   No education needs have been identified at this time  Skin:  Skin Assessment: Skin Integrity Issues: Skin Integrity Issues:: Unstageable Unstageable: R heel  Last BM:  6/30  Height:   Ht Readings from Last 1 Encounters:  06/24/20 '5\' 8"'  (1.727 m)    Weight:   Wt Readings from Last 1 Encounters:  06/26/20 62.1 kg    Ideal Body Weight:  70 kg  BMI:  Body mass index is 20.82 kg/m.  Estimated Nutritional Needs:   Kcal:  2000-2200  Protein:  90-110 gm  Fluid:  >/= 2 L    Lucas Mallow, RD, LDN, CNSC Please refer to Amion for contact information.

## 2020-06-26 NOTE — Progress Notes (Signed)
Per previous shift it was documented pt had an unstageable wound on R heel, however this area appears to be callused with no eschar. Heel foams applied bilaterally for prophylactic purposes only.

## 2020-06-26 NOTE — Progress Notes (Signed)
CRITICAL VALUE ALERT  Critical Value: K 3.1   Date & Time Notied: 06/26/20 0515  Provider Notified: Warren Lacy   Orders Received/Actions taken: TBD   Gabriel Rainwater, RN

## 2020-06-26 NOTE — Procedures (Signed)
Extubation Procedure Note  Patient Details:   Name: Joshua [name] DOB: 1981/09/11 MRN: [id number]   Airway Documentation:    Vent end date: 06/26/20 Vent end time: 0905   Evaluation  O2 sats: stable throughout Complications: No apparent complications Patient did tolerate procedure well. Bilateral Breath Sounds: Clear, Diminished   No   Patient extubated to a 4L [place]. Cuff leak was heard. No stridor was noted. RN at the bedside with RT during extubation.  Renato Gails Lester Platas 06/26/2020, 9:10 AM

## 2020-06-26 NOTE — Progress Notes (Signed)
Subjective: Patient reports on ventilator, not following commands, but does arouse to verbal stimuli.    Objective: Vital signs in last 24 hours: Temp:  [98.4 F (36.9 C)-101.7 F (38.7 C)] 99.1 F (37.3 C) (06/30 0716) Pulse Rate:  [86-122] 93 (06/30 0600) Resp:  [14-19] 18 (06/30 0600) BP: (114-153)/(93-127) 149/116 (06/30 0600) SpO2:  [94 %-100 %] 100 % (06/30 0733) FiO2 (%):  [40 %] 40 % (06/30 0733) Weight:  [62.1 kg] 62.1 kg (06/30 0257)  Intake/Output from previous day: 06/29 0701 - 06/30 0700 In: 2378.6 [I.V.:1110.4; NG/GT:530; IV Piggyback:728.2] Out: 1125 [Urine:1100; Emesis/NG output:25] Intake/Output this shift: Total I/O In: -  Out: 140 [Urine:140]  Physical Exam: Patient is intubated and sedated on propofol gtt. He responds to verbal stimuli with eye opening but is not making eye contact or following any commands. He is not tracking as I walk around the room. PERRL 75mm and sluggish. Globally aphasic. He is localizing to stimuli in his LUE and intermittently reaches for his ETT. Bilateral safety mitts on and LUE non-violent restraint on for patient safety. He has minimal withdrawal to noxious stimuli in his RUE and BLE with left greater than right. Corneal and cough reflex is intact.  Lab Results: Recent Labs    06/25/20 0316 06/26/20 0303  WBC 13.7* 11.9*  HGB 12.8* 10.5*  HCT 39.8 33.6*  PLT 223 177   BMET Recent Labs    06/25/20 0316 06/26/20 0303  NA 140 143  K 3.9 3.1*  CL 108 112*  CO2 21* 20*  GLUCOSE 99 114*  BUN 20 21*  CREATININE 2.62* 2.51*  CALCIUM 8.8* 8.5*    Studies/Results: DG CHEST PORT 1 VIEW  Result Date: 06/26/2020 CLINICAL DATA:  Respiratory failure. EXAM: PORTABLE CHEST 1 VIEW COMPARISON:  06/23/2020 FINDINGS: The endotracheal tube is 3.5 cm above the carina. The NG tube is coursing down the esophagus and into the stomach. The cardiac silhouette, mediastinal and hilar contours are within normal limits and stable. Streaky  basilar atelectasis but no infiltrates, edema or effusions. IMPRESSION: 1. Stable support apparatus. 2. Streaky bibasilar atelectasis.  No edema or effusions. Electronically Signed   By: Marijo Sanes M.D.   On: 06/26/2020 07:57    Assessment/Plan: I will attempt to contact the patient's mother today to discuss goals of care. If the need arises(if family desires this to be done), we can replace his bone flap at his prior craniectomy site. Continue supportive care. Continue antiepileptics per Neurology.Work towards extubation. Call with any questions.    LOS: 3 days    Peggyann Shoals, MD 06/26/2020, 8:46 AM

## 2020-06-26 NOTE — Progress Notes (Signed)
[place] Progress Note [name] DOB: [birth year] MRN: [id number]   Date of Service  06/26/2020  HPI/Events of Note  K+ 3.1  eICU Interventions  KCL 30 meq via PEG Q 4 hours x 2 doses        [name] 06/26/2020, 6:08 AM

## 2020-06-27 ENCOUNTER — Inpatient Hospital Stay (HOSPITAL_COMMUNITY): Payer: Medicaid Other

## 2020-06-27 LAB — BASIC METABOLIC PANEL
Anion gap: 8 (ref 5–15)
BUN: 16 mg/dL (ref 6–20)
CO2: 24 mmol/L (ref 22–32)
Calcium: 9.1 mg/dL (ref 8.9–10.3)
Chloride: 112 mmol/L — ABNORMAL HIGH (ref 98–111)
Creatinine, Ser: 1.72 mg/dL — ABNORMAL HIGH (ref 0.61–1.24)
GFR calc Af Amer: 56 mL/min — ABNORMAL LOW (ref >60)
GFR calc non Af Amer: 49 mL/min — ABNORMAL LOW (ref >60)
Glucose, Bld: 136 mg/dL — ABNORMAL HIGH (ref 70–99)
Potassium: 3.8 mmol/L (ref 3.5–5.1)
Sodium: 144 mmol/L (ref 135–145)

## 2020-06-27 LAB — PHOSPHORUS: Phosphorus: 3.4 mg/dL (ref 2.5–4.6)

## 2020-06-27 LAB — CBC
HCT: 37.9 % — ABNORMAL LOW (ref 39.0–52.0)
Hemoglobin: 12 g/dL — ABNORMAL LOW (ref 13.0–17.0)
MCH: 25 pg — ABNORMAL LOW (ref 26.0–34.0)
MCHC: 31.7 g/dL (ref 30.0–36.0)
MCV: 79 fL — ABNORMAL LOW (ref 80.0–100.0)
Platelets: 209 10*3/uL (ref 150–400)
RBC: 4.8 MIL/uL (ref 4.22–5.81)
RDW: 16.8 % — ABNORMAL HIGH (ref 11.5–15.5)
WBC: 9.3 10*3/uL (ref 4.0–10.5)
nRBC: 0.2 % (ref 0.0–0.2)

## 2020-06-27 LAB — GLUCOSE, CAPILLARY
Glucose-Capillary: 124 mg/dL — ABNORMAL HIGH (ref 70–99)
Glucose-Capillary: 125 mg/dL — ABNORMAL HIGH (ref 70–99)
Glucose-Capillary: 140 mg/dL — ABNORMAL HIGH (ref 70–99)
Glucose-Capillary: 116 mg/dL — ABNORMAL HIGH (ref 70–99)
Glucose-Capillary: 133 mg/dL — ABNORMAL HIGH (ref 70–99)
Glucose-Capillary: 121 mg/dL — ABNORMAL HIGH (ref 70–99)

## 2020-06-27 LAB — MAGNESIUM: Magnesium: 2.1 mg/dL (ref 1.7–2.4)

## 2020-06-27 MED ORDER — DOCUSATE SODIUM 50 MG/5ML PO LIQD: 100.0000 mg | Freq: Two times a day (BID) | ORAL | Status: DC | PRN

## 2020-06-27 MED ORDER — AMLODIPINE BESYLATE 10 MG PO TABS
10.0000 mg | ORAL_TABLET | Freq: Every day | ORAL | Status: DC
  Administered 2020-06-27 – 2020-06-30 (×4): 10 mg
  Filled 2020-06-27 (×5): qty 1

## 2020-06-27 MED ORDER — METOPROLOL TARTRATE 25 MG PO TABS
25.0000 mg | ORAL_TABLET | Freq: Two times a day (BID) | ORAL | Status: DC
  Administered 2020-06-27 – 2020-07-05 (×14): 25 mg
  Filled 2020-06-27 (×5): qty 1
  Filled 2020-06-27 (×2): qty 2
  Filled 2020-06-27 (×2): qty 1
  Filled 2020-06-27: qty 2
  Filled 2020-06-27 (×2): qty 1
  Filled 2020-06-27: qty 2
  Filled 2020-06-27 (×5): qty 1

## 2020-06-27 MED ORDER — SODIUM CHLORIDE 0.9 % IV SOLN
750.0000 mg | Freq: Two times a day (BID) | INTRAVENOUS | Status: DC
  Administered 2020-06-27 (×2): 750 mg via INTRAVENOUS
  Filled 2020-06-27 (×4): qty 7.5

## 2020-06-27 NOTE — Evaluation (Signed)
Occupational Therapy Evaluation and Discharge Patient Details Name: Joshua [name] MRN: [id number] DOB: 10-15-1981 Today's Date: 06/27/2020    History of Present Illness Pt is a 39 year old man admitted on 06/23/20 from his SNF after falling OOB with break through seizure. 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 D/C to SNF in April 2021. Pt with residual aphasia, non verbal, with R hemiplegia.   Clinical Impression   Pt likely functioning very close if not at his baseline. No OT needs.    Follow Up Recommendations  SNF;Supervision/Assistance - 24 hour    Equipment Recommendations  None recommended by OT    Recommendations for Other Services       Precautions / Restrictions Precautions Precautions: Fall Precaution Comments: PEG, incontinent of bowel      Mobility Bed Mobility Overal bed mobility: Needs Assistance Bed Mobility: Rolling;Sidelying to Sit;Supine to Sit Rolling: Max assist Sidelying to sit: Mod assist Supine to sit: Mod assist     General bed mobility comments: assist for all aspects, rolled for pericare  Transfers Overall transfer level: Needs assistance   Transfers: Sit to/from Stand Sit to Stand: Mod assist         General transfer comment: mod assist from elevated bed to partially stand, pt with knee contractures    Balance Overall balance assessment: Needs assistance Sitting-balance support: Feet supported Sitting balance-Leahy Scale: Fair Sitting balance - Comments: min guard assist, noted to lose balance toward L and unable to right     Standing balance-Leahy Scale: Zero                             ADL either performed or assessed with clinical judgement   ADL Overall ADL's : At baseline                                             Vision Patient Visual Report: No change from baseline       Perception     Praxis      Pertinent Vitals/Pain Pain Assessment:  Faces Faces Pain Scale: Hurts little more Pain Location: R UE with ROM, buttocks with pericare Pain Descriptors / Indicators: Grimacing;Guarding Pain Intervention(s): Monitored during session     Hand Dominance     Extremity/Trunk Assessment Upper Extremity Assessment Upper Extremity Assessment: RUE deficits/detail RUE Deficits / Details: increased flexor tone throughout, per chart review, pt with hand splint but did not tolerate RUE Coordination: decreased fine motor;decreased gross motor   Lower Extremity Assessment Lower Extremity Assessment: Defer to PT evaluation       Communication Communication Communication: Receptive difficulties;Expressive difficulties (non verbal)   Cognition Arousal/Alertness: Awake/alert Behavior During Therapy: WFL for tasks assessed/performed Overall Cognitive Status: No family/caregiver present to determine baseline cognitive functioning                                     General Comments       Exercises     Shoulder Instructions      Home Living Family/patient expects to be discharged to:: Skilled nursing facility  Prior Functioning/Environment Level of Independence: Needs assistance  Gait / Transfers Assistance Needed: bed bound, assisted for transfers/mobility ADL's / Homemaking Assistance Needed: fed via PEG, likely dependent in ADL at SNF            OT Problem List:        OT Treatment/Interventions:      OT Goals(Current goals can be found in the care plan section)    OT Frequency:     Barriers to D/C:            Co-evaluation PT/OT/SLP Co-Evaluation/Treatment: Yes Reason for Co-Treatment: For patient/therapist safety;Complexity of the patient's impairments (multi-system involvement)   OT goals addressed during session: Strengthening/ROM      AM-PAC OT "6 Clicks" Daily Activity     Outcome Measure Help from another person eating meals?:  Total Help from another person taking care of personal grooming?: A Lot Help from another person toileting, which includes using toliet, bedpan, or urinal?: Total Help from another person bathing (including washing, rinsing, drying)?: Total Help from another person to put on and taking off regular upper body clothing?: Total Help from another person to put on and taking off regular lower body clothing?: Total 6 Click Score: 7   End of Session Nurse Communication: Other (comment) (aware condom cath is off)  Activity Tolerance: Patient tolerated treatment well Patient left: in bed;with call bell/phone within reach;with bed alarm set  OT Visit Diagnosis: Muscle weakness (generalized) (M62.81);Hemiplegia and hemiparesis;Pain;Other symptoms and signs involving cognitive function Hemiplegia - Right/Left: Right Hemiplegia - dominant/non-dominant: Dominant Hemiplegia - caused by: Other Nontraumatic intracranial hemorrhage Pain - Right/Left: Right Pain - part of body: Arm                Time: 3557-3220 OT Time Calculation (min): 30 min Charges:  OT General Charges $OT Visit: 1 Visit OT Evaluation $OT Eval Moderate Complexity: 1 Mod  Nestor Lewandowsky, OTR/L Acute Rehabilitation Services Pager: [phone] Office: [phone]  Malka So 06/27/2020, 3:55 PM

## 2020-06-27 NOTE — Progress Notes (Signed)
Subjective: Patient reports extubated yesterday, alert, making eye contact, and tracking. He is not following commands and remains non-verbal.   Objective: Vital signs in last 24 hours: Temp:  [97.6 F (36.4 C)-99.3 F (37.4 C)] 98 F (36.7 C) (07/01 0731) Pulse Rate:  [95-121] 100 (07/01 0700) Resp:  [22-44] 22 (07/01 0700) BP: (123-158)/(104-120) 144/119 (07/01 0700) SpO2:  [92 %-100 %] 94 % (07/01 0700) FiO2 (%):  [40 %] 40 % (06/30 1200) Weight:  [60.3 kg] 60.3 kg (07/01 0336)  Intake/Output from previous day: 06/30 0701 - 07/01 0700 In: 2975.6 [I.V.:1071.8; AU/QJ:3354.5; IV Piggyback:400] Out: 6256 [Urine:4140] Intake/Output this shift: No intake/output data recorded.  Physical Exam: Patient extubated. He is alert, making eye contact, and tracking. He does not appear to be following verbal commands and appears to be able to follow simple commands through the use of pantomime. Globally aphasic. PERRL 72mm and sluggish.  He is purposeful in his LUE and able to move against gravity only. He has minimal spontaneous movement in his BLE. RUE with minimal withdrawal to noxious stimuli.    Lab Results: Recent Labs    06/26/20 0303 06/27/20 0250  WBC 11.9* 9.3  HGB 10.5* 12.0*  HCT 33.6* 37.9*  PLT 177 209   BMET Recent Labs    06/26/20 0303 06/27/20 0250  NA 143 144  K 3.1* 3.8  CL 112* 112*  CO2 20* 24  GLUCOSE 114* 136*  BUN 21* 16  CREATININE 2.51* 1.72*  CALCIUM 8.5* 9.1    Studies/Results: DG Chest Port 1 View  Result Date: 06/27/2020 CLINICAL DATA:  Acute respiratory failure.  Hypoxia. EXAM: PORTABLE CHEST 1 VIEW COMPARISON:  06/26/2020.  06/23/2020. FINDINGS: Interim extubation and removal of NG tube. Right base infiltrate consistent with pneumonia. Aspiration cannot be excluded. No pleural effusion or pneumothorax. Heart size stable. No pulmonary venous congestion. No acute bony abnormality identified. IMPRESSION: Interim extubation and removal of NG tube.  Right base infiltrate consistent with pneumonia. Aspiration cannot be excluded Electronically Signed   By: Marcello Moores  Register   On: 06/27/2020 06:56   DG CHEST PORT 1 VIEW  Result Date: 06/26/2020 CLINICAL DATA:  Respiratory failure. EXAM: PORTABLE CHEST 1 VIEW COMPARISON:  06/23/2020 FINDINGS: The endotracheal tube is 3.5 cm above the carina. The NG tube is coursing down the esophagus and into the stomach. The cardiac silhouette, mediastinal and hilar contours are within normal limits and stable. Streaky basilar atelectasis but no infiltrates, edema or effusions. IMPRESSION: 1. Stable support apparatus. 2. Streaky bibasilar atelectasis.  No edema or effusions. Electronically Signed   By: Marijo Sanes M.D.   On: 06/26/2020 07:57    Assessment/Plan: The patient appears to be close to his baseline status. I will continue to contact the patient's mother to discuss goals of care. If the need arises(if family desires this to be done), we can replace his bone flap at his prior craniectomy site. Continue supportive care. Continue antiepileptics per Neurology. Call with any questions.    LOS: 4 days    Peggyann Shoals, MD 06/27/2020, 8:32 AM

## 2020-06-27 NOTE — Progress Notes (Signed)
NAME:  Joshua [name], MRN:  [id number], DOB:  Oct 15, 1981, LOS: 4 ADMISSION DATE:  06/23/2020, CONSULTATION DATE:  06/27/20 REFERRING MD:  EDP, CHIEF COMPLAINT:  seizure   Brief History   39 y.o. M with PMH of seizures on Keppra after previous ICH. Hemicraniectomy and prolonged hospital stay who presents from assisted living after breakthrough seizure activity and unwitnessed fall.  Intubated and found to have new L frontal ICH  History of present illness   Joshua [name] is a 39 y.o. M who suffered seizure and L hemispheric ICH in 08/2019 s/p L craniotomy and decompressive craniectomy with evacuation of ICH and prolonged hospital stay.  He was aphasic and nonverbal with right hemiplegia and significant cognitive impairment and was discharged to SNF on Keppra twice daily.   Per patient's brother, he has been at his baseline recently with no known illnesses or breakthrough seizures until today when he seized and had an unwitnessed fall possibly out of bed.  Patient had another seizure after EMS arrival with tonic-clonic movement.  On arrival to the ED he was intubated and started on propofol, neurology consulted.  A CT head was obtained left frontal ICH.  He had an episode of tachycardia in the ED which was treated with adenosine, pt was hypertensive and given Hydralazine.  Labs reveal elevated lactic acid of 7 and hypokalemia of 2.8. Patient's brother is at the bedside and confirms that he is full code and they would like aggressive care.   Past Medical History  [facility] Events   6/28 Admit to PCCM  Consults:  Neurology  Procedures:  ETT 6/27 >> 6/30  Significant Diagnostic Tests:  06/27/20  CXR>> no acute findings 6/28 CT head>>9 mm area of acute intraparenchymal hemorrhage left frontal cortex. 6/28 Overnight EEG - Continuous slow, generalized and maximal left frontal region - Sharp wave, left frontal region  This study showed evidence of potential epileptogenicity  as well as cortical dysfunction in left frontal region likely secondary to underlying structural abnormality. Additionally, there is evidence of severe diffuse encephalopathy, non specific to etiology but could be related to sedation. No seizures were seen throughout the recording.  Micro Data:  6/28 blood cultures x2>> 6/27 SARS-Covid-2>> negative 6/27 urine culture>>  Antimicrobials:  Zosyn 6/27 Vancomycin 6/27 Unasyn 6/28>> 6/30  Interim history/subjective:   Extubated successfully 6/30 Hypertensive post extubation Renal fxn improving   Objective   Blood pressure (!) 144/119, pulse 100, temperature 98 F (36.7 C), temperature source Oral, resp. rate (!) 22, height 5\' 8"  (1.727 m), weight 60.3 kg, SpO2 94 %.    Vent Mode: PSV;CPAP FiO2 (%):  [40 %] 40 % PEEP:  [5 cmH20] 5 cmH20 Pressure Support:  [5 cmH20] 5 cmH20   Intake/Output Summary (Last 24 hours) at 06/27/2020 0739 Last data filed at 06/27/2020 0600 Gross per 24 hour  Intake 2975.58 ml  Output 4140 ml  Net -1164.42 ml   Filed Weights   06/25/20 0500 06/26/20 0257 06/27/20 0336  Weight: 64.5 kg 62.1 kg 60.3 kg    General: Thin gentleman, no distress HEENT: OP clear, PERRL Neuro: Awake, will focus and track, follows commands on the left.  Strong upper extremity, weak lower extremity CV: Regular, tachy, distant, no M PULM: Clear B GI: Nondistended, positive bowel sounds, PEG Extremities: Bilateral chronic muscle wasting, no edema Skin: no rash   Resolved Hospital Problem list   Lactic acidosis  Acute respiratory failure Aspiration PNA (treated)  Assessment & Plan:  Acute L frontal ICH and breakthrough seizure activity with subsequent hypoxic respiratory failure in the setting of prior L hemispheric ICH s/p craniotomy and decompressive craniectomy Loaded with Keppra in the ED and evaluated by Neurology >> plan to continue, renal dose adjusted Continuous EEG monitoring completed. No overt sz but  epileptiform focus L frontal region Appreciate neurology and NSGY assistance. May consider bone flap replacement depending on family's wishes Following MS clinically.  Will reimage if change in mental status to evaluate for evolving small ICH.   Acute respiratory failure, ventilator dependence due to Andrew, poor airway protection. Resolved  Continue to push pulmonary hygiene  Concern for Aspiration Event, PNA Plan No over infiltrate chest x-ray 6/30, WBC normalizing Completed day 3 of Unasyn today 6/30 Following off abx  HTN BP elevated, HR elevated BP Goal is < 250 systolic Increased amlodipine and clonidine 6/30, may need to increase further depending on trend Add metoprolol 7/1 Labetalol as needed   Hypokalemia Possibly nutritional deficiency, pt has PEG on TF Continue to follow BMP, replace as indicated   Acute on chronic renal insufficiency Creatinine 1.65, appears this may be new baseline as consistent with renal function two months ago. Improved, now approaching baseline.  Plan Continue to follow BMP, urine output Maintenance 0.45 NS at 50 cc/h until nutrition reinitiated  Lactic acidosis, resolved Likely secondary to seizures, no evidence of infection on CXR, UA or by history  Best practice:  Diet: TF Pain/Anxiety/Delirium protocol (if indicated): off VAP protocol (if indicated): HOB 30 degrees, suction prn DVT prophylaxis: SCD's GI prophylaxis: protonix Glucose control: SSI Mobility: bed rest Code Status: full code Family Communication: Attempted to call Brother and Mother 7/1, will try again later today Disposition: ICU >> can move to floor bed on 7/1  Labs   CBC: Recent Labs  Lab 06/23/20 2110 06/23/20 2129 06/24/20 0347 06/24/20 0451 06/25/20 0316 06/26/20 0303 06/27/20 0250  WBC 13.4*  --   --  13.7* 13.7* 11.9* 9.3  HGB 13.1   < > 14.6 14.1 12.8* 10.5* 12.0*  HCT 43.9   < > 43.0 45.1 39.8 33.6* 37.9*  MCV 84.1  --   --  81.3 79.1* 80.0 79.0*   PLT 243  --   --  258 223 177 209   < > = values in this interval not displayed.    Basic Metabolic Panel: Recent Labs  Lab 06/24/20 0048 06/24/20 0347 06/24/20 0451 06/24/20 1502 06/25/20 0316 06/26/20 0303 06/27/20 0250  NA 138   < > 138 138 140 143 144  K 2.6*   < > 5.3* 4.6 3.9 3.1* 3.8  CL 104   < > 107 108 108 112* 112*  CO2 18*   < > 19* 20* 21* 20* 24  GLUCOSE 211*   < > 106* 110* 99 114* 136*  BUN 17   < > 17 19 20  21* 16  CREATININE 1.76*   < > 1.94* 2.46* 2.62* 2.51* 1.72*  CALCIUM 9.1   < > 8.7* 8.8* 8.8* 8.5* 9.1  MG 2.0  --  2.2  --   --  2.1 2.1  PHOS  --   --  1.2*  --  3.6  --  3.4   < > = values in this interval not displayed.   GFR: Estimated Creatinine Clearance: 48.7 mL/min (A) (by C-G formula based on SCr of 1.72 mg/dL (H)). Recent Labs  Lab 06/23/20 2110 06/23/20 2110 06/24/20 5397 06/24/20 0451 06/24/20 6734 06/25/20 0316  06/26/20 0303 06/27/20 0250  WBC 13.4*   < >  --  13.7*  --  13.7* 11.9* 9.3  LATICACIDVEN 7.3*  --  6.0*  --  3.9*  --   --   --    < > = values in this interval not displayed.    Liver Function Tests: Recent Labs  Lab 06/23/20 2110 06/25/20 0316  AST 20  --   ALT 9  --   ALKPHOS 103  --   BILITOT 0.4  --   PROT 6.7  --   ALBUMIN 3.1* 2.6*   No results for input(s): LIPASE, AMYLASE in the last 168 hours. No results for input(s): AMMONIA in the last 168 hours.  ABG    Component Value Date/Time   PHART 7.359 06/24/2020 0347   PCO2ART 32.4 06/24/2020 0347   PO2ART 129 (H) 06/24/2020 0347   HCO3 18.2 (L) 06/24/2020 0347   TCO2 19 (L) 06/24/2020 0347   ACIDBASEDEF 6.0 (H) 06/24/2020 0347   O2SAT 99.0 06/24/2020 0347     Coagulation Profile: No results for input(s): INR, PROTIME in the last 168 hours.  Cardiac Enzymes: Recent Labs  Lab 06/23/20 2110  CKTOTAL 90    HbA1C: Hgb A1c MFr Bld  Date/Time Value Ref Range Status  06/24/2020 12:48 AM 6.0 (H) 4.8 - 5.6 % Final    Comment:    (NOTE) Pre  diabetes:          5.7%-6.4%  Diabetes:              >6.4%  Glycemic control for   <7.0% adults with diabetes   09/10/2019 12:56 AM 5.6 4.8 - 5.6 % Final    Comment:    (NOTE)         Prediabetes: 5.7 - 6.4         Diabetes: >6.4         Glycemic control for adults with diabetes: <7.0     CBG: Recent Labs  Lab 06/26/20 1505 06/26/20 1903 06/26/20 2310 06/27/20 0314 06/27/20 0729  GLUCAP 123* 108* 128* 124* 125*     Critical care time: NA     Baltazar Apo, MD, PhD 06/27/2020, 7:55 AM [facility] [phone] or if no answer [phone]

## 2020-06-27 NOTE — Evaluation (Signed)
Physical Therapy Evaluation Patient Details Name: Joshua [name] MRN: [id number] DOB: 15-Jul-1981 Today's Date: 06/27/2020   History of Present Illness  Pt is a 39 year old man admitted on 06/23/20 from his SNF after falling OOB with break through seizure. 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 D/C to SNF in April 2021. Pt with residual aphasia, non verbal, with R hemiplegia.  Clinical Impression  Pt admitted with/for seizure and ICH.  Pt currently limited functionally due to the problems listed and needing mod to max assist of 2 persons at times ( See problems list.)   Pt will benefit from PT to maximize function and safety in order to get ready for next venue listed below.     Follow Up Recommendations SNF;Supervision/Assistance - 24 hour    Equipment Recommendations  Other (comment) (TBA)    Recommendations for Other Services       Precautions / Restrictions Precautions Precautions: Fall Precaution Comments: PEG, incontinent of bowel      Mobility  Bed Mobility Overal bed mobility: Needs Assistance Bed Mobility: Rolling;Sidelying to Sit;Supine to Sit Rolling: Max assist Sidelying to sit: Mod assist Supine to sit: Mod assist     General bed mobility comments: assist for all aspects, rolled for pericare  Transfers Overall transfer level: Needs assistance   Transfers: Sit to/from Stand Sit to Stand: Mod assist         General transfer comment: mod assist from elevated bed to partially stand, pt with knee contractures  Ambulation/Gait                Stairs            Wheelchair Mobility    Modified Rankin (Stroke Patients Only) Modified Rankin (Stroke Patients Only) Modified Rankin: Severe disability     Balance Overall balance assessment: Needs assistance Sitting-balance support: Feet supported Sitting balance-Leahy Scale: Fair Sitting balance - Comments: min guard assist, noted to lose balance toward L  and unable to right.  Once started focusing on a kicking out/LAQ task, pt maintained sitting balance with min guard.     Standing balance-Leahy Scale: Zero Standing balance comment: reliant on significant external support                             Pertinent Vitals/Pain Pain Assessment: Faces Faces Pain Scale: Hurts little more Pain Location: R UE with ROM, buttocks with pericare Pain Descriptors / Indicators: Grimacing;Guarding Pain Intervention(s): Monitored during session    Home Living Family/patient expects to be discharged to:: Skilled nursing facility                      Prior Function Level of Independence: Needs assistance   Gait / Transfers Assistance Needed: bed bound, assisted for transfers/mobility  ADL's / Homemaking Assistance Needed: fed via PEG, likely dependent in ADL at SNF        Hand Dominance        Extremity/Trunk Assessment        Lower Extremity Assessment Lower Extremity Assessment: RLE deficits/detail;LLE deficits/detail RLE Deficits / Details: mild knee extension contracture, increased flexor tone, minimal voluntary movement or to command, but minimal movement in synergy. RLE Coordination: decreased fine motor;decreased gross motor LLE Deficits / Details: mild extension contracture with generalized weakness, able to bear weight in stance, but not fulling extend. LLE Coordination: decreased fine motor  Communication   Communication: Receptive difficulties;Expressive difficulties (non verbal)  Cognition Arousal/Alertness: Awake/alert Behavior During Therapy: WFL for tasks assessed/performed Overall Cognitive Status: No family/caregiver present to determine baseline cognitive functioning                                        General Comments      Exercises     Assessment/Plan    PT Assessment Patient needs continued PT services  PT Problem List Decreased strength;Decreased activity  tolerance;Decreased balance;Decreased mobility;Decreased coordination;Decreased knowledge of precautions;Impaired tone;Decreased skin integrity;Pain       PT Treatment Interventions Functional mobility training;Therapeutic activities;Therapeutic exercise;Balance training;Neuromuscular re-education;Patient/family education    PT Goals (Current goals can be found in the Care Plan section)  Acute Rehab PT Goals Patient Stated Goal: not stated PT Goal Formulation: Patient unable to participate in goal setting Time For Goal Achievement: 07/11/20 Potential to Achieve Goals: Fair    Frequency Min 2X/week   Barriers to discharge        Co-evaluation PT/OT/SLP Co-Evaluation/Treatment: Yes Reason for Co-Treatment: For patient/therapist safety PT goals addressed during session: Mobility/safety with mobility;Strengthening/ROM OT goals addressed during session: Strengthening/ROM       AM-PAC PT "6 Clicks" Mobility  Outcome Measure Help needed turning from your back to your side while in a flat bed without using bedrails?: A Lot Help needed moving from lying on your back to sitting on the side of a flat bed without using bedrails?: A Lot Help needed moving to and from a bed to a chair (including a wheelchair)?: A Lot Help needed standing up from a chair using your arms (e.g., wheelchair or bedside chair)?: A Lot Help needed to walk in hospital room?: Total Help needed climbing 3-5 steps with a railing? : Total 6 Click Score: 10    End of Session Equipment Utilized During Treatment: Gait belt Activity Tolerance: Patient tolerated treatment well;Patient limited by fatigue Patient left: with call bell/phone within reach;in bed;with bed alarm set Nurse Communication: Need for lift equipment;Mobility status PT Visit Diagnosis: Other abnormalities of gait and mobility (R26.89);Muscle weakness (generalized) (M62.81);Other symptoms and signs involving the nervous system (R29.898);Hemiplegia and  hemiparesis Hemiplegia - Right/Left: Right Hemiplegia - caused by: Nontraumatic intracerebral hemorrhage    Time: 1453-1513 PT Time Calculation (min) (ACUTE ONLY): 20 min   Charges:   PT Evaluation $PT Eval High Complexity: 1 High          06/27/2020  Ginger Carne., PT Acute Rehabilitation Services [phone]  (pager) [phone]  (office)  Tessie Fass Kashae Carstens 06/27/2020, 6:44 PM

## 2020-06-28 ENCOUNTER — Inpatient Hospital Stay (HOSPITAL_COMMUNITY): Payer: Medicaid Other

## 2020-06-28 DIAGNOSIS — E876 Hypokalemia: Secondary | ICD-10-CM

## 2020-06-28 DIAGNOSIS — J690 Pneumonitis due to inhalation of food and vomit: Secondary | ICD-10-CM

## 2020-06-28 LAB — BASIC METABOLIC PANEL
Anion gap: 12 (ref 5–15)
BUN: 18 mg/dL (ref 6–20)
CO2: 22 mmol/L (ref 22–32)
Calcium: 9.2 mg/dL (ref 8.9–10.3)
Chloride: 107 mmol/L (ref 98–111)
Creatinine, Ser: 1.48 mg/dL — ABNORMAL HIGH (ref 0.61–1.24)
GFR calc Af Amer: >60 mL/min (ref >60)
GFR calc non Af Amer: 58 mL/min — ABNORMAL LOW (ref >60)
Glucose, Bld: 124 mg/dL — ABNORMAL HIGH (ref 70–99)
Potassium: 3.5 mmol/L (ref 3.5–5.1)
Sodium: 141 mmol/L (ref 135–145)

## 2020-06-28 LAB — GLUCOSE, CAPILLARY
Glucose-Capillary: 126 mg/dL — ABNORMAL HIGH (ref 70–99)
Glucose-Capillary: 119 mg/dL — ABNORMAL HIGH (ref 70–99)
Glucose-Capillary: 109 mg/dL — ABNORMAL HIGH (ref 70–99)
Glucose-Capillary: 92 mg/dL (ref 70–99)
Glucose-Capillary: 113 mg/dL — ABNORMAL HIGH (ref 70–99)

## 2020-06-28 LAB — CULTURE, BLOOD (ROUTINE X 2): Culture: NO GROWTH

## 2020-06-28 LAB — MAGNESIUM: Magnesium: 1.7 mg/dL (ref 1.7–2.4)

## 2020-06-28 LAB — PHOSPHORUS: Phosphorus: 4.5 mg/dL (ref 2.5–4.6)

## 2020-06-28 MED ORDER — LEVETIRACETAM 100 MG/ML PO SOLN
750.0000 mg | Freq: Two times a day (BID) | ORAL | Status: DC
  Administered 2020-06-28 – 2020-07-05 (×12): 750 mg
  Filled 2020-06-28 (×4): qty 7.5
  Filled 2020-06-28: qty 10
  Filled 2020-06-28 (×8): qty 7.5
  Filled 2020-06-28: qty 10
  Filled 2020-06-28 (×3): qty 7.5

## 2020-06-28 MED ORDER — HYDRALAZINE HCL 25 MG PO TABS
25.0000 mg | ORAL_TABLET | Freq: Three times a day (TID) | ORAL | Status: DC
  Administered 2020-06-28 – 2020-06-30 (×8): 25 mg via ORAL
  Filled 2020-06-28 (×9): qty 1

## 2020-06-28 NOTE — Progress Notes (Signed)
PROGRESS NOTE    Joshua [name]  HOZ:[id number] DOB: 07-10-1981 DOA: 06/23/2020 PCP: Patient, [name]   Chief Complaint  Patient presents with  . 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  On arrival to ED was intubated for airway protection.  CT of the head shows new left frontal ICH.  He had an episode of tachycardia in the ED which was treated with adenosine.  He was extubated on 06/26/2020.  Neurology consulted and recommendations given.  Neurosurgery on board.  Patient was transferred from [facility] service to [facility] on 06/28/2020.  Assessment & Plan:   Active Problems:   Intracranial hemorrhage (HCC)   Seizure (HCC)   Acute left frontal intracranial hemorrhage and breakthrough seizures with subsequent acute respiratory failure in the setting of prior left hemispheric intracranial hemorrhage.  History of craniotomy and decompression craniectomy.   Patient was initially loaded with Keppra in the ED and evaluated by neurology.  Transition to oral Keppra.  Continuous EEG completed which does not show any overt seizures but was found to have epileptiform focus in the left frontal region.  Neurosurgery on board and possible consideration for bone flap replacement. Continue to monitor mental status. Patient appears to be at baseline at this time.   Acute respiratory failure with hypoxia secondary to seizures s/p intubation and extubated on 06/26/2020. Continue with pulmonary hygiene.   Possible aspiration event Chest x-ray on 6/30 does not show any infiltrate.  Completed 3 days of Unasyn. Patient is afebrile and WBC count within normal limits  today.    Hypertensive urgency. Patient on clonidine and amlodipine and metoprolol was added.  Labetalol as needed.  Hydralazine will be added today.   Hypokalemia Replaced.   Lactic acidosis.  Resolved.  Probably from seizures.    AKI;  probably from dehydration.  Creatinine on admission is 2.62 improved to 1.48 which is better than his baseline.    DVT prophylaxis: scd's Code Status: FULL CODE.  Family Communication: none at bedside, family not at bedside.  Disposition:   Status is: Inpatient  Remains inpatient appropriate because:Ongoing diagnostic testing needed not appropriate for outpatient work up   Dispo: The patient is from: SNF              Anticipated d/c is to: SNF              Anticipated d/c date is: 2 days              Patient currently is not medically stable to d/c.       Consultants:   Neurosurgery  PCCM   Procedures: none.    Antimicrobials: none.    Subjective: Patient is nonverbal and not responding to commands.  He is alert and appears comfortable  Objective: Vitals:   06/28/20 0600 06/28/20 0700 06/28/20 0900 06/28/20 1100  BP: (!) 127/108  (!) 161/116   Pulse: (!) 34  (!) 103   Resp: (!) 34  (!) 27   Temp:  97.8 F (36.6 C)  97.6 F (36.4 C)  TempSrc:  Oral  Oral  SpO2: 98%  97%   Weight:      Height:        Intake/Output Summary (Last 24 hours) at  06/28/2020 1334 Last data filed at 06/28/2020 1000 Gross per 24 hour  Intake 1577.65 ml  Output 925 ml  Net 652.65 ml   Filed Weights   06/26/20 0257 06/27/20 0336 06/28/20 0500  Weight: 62.1 kg 60.3 kg 58.4 kg    Examination:  General exam: Appears calm and comfortable  Respiratory system: Clear to auscultation. Respiratory effort normal. Cardiovascular system: S1 & S2 heard, RRR. No JVD, No pedal edema. Gastrointestinal system: Abdomen is nondistended, soft and nontender.  Normal bowel sounds heard. Central nervous system: Alert , appears non verbal. Able to  move extremities voluntarily.  Extremities: No pedal edema.  Skin: no rashes seen.  Psychiatry:  Cannot be assessed.     Data Reviewed: I have personally reviewed following labs and imaging studies  CBC: Recent Labs  Lab 06/23/20 2110 06/23/20 2129 06/24/20 0347 06/24/20 0451 06/25/20 0316 06/26/20 0303 06/27/20 0250  WBC 13.4*  --   --  13.7* 13.7* 11.9* 9.3  HGB 13.1   < > 14.6 14.1 12.8* 10.5* 12.0*  HCT 43.9   < > 43.0 45.1 39.8 33.6* 37.9*  MCV 84.1  --   --  81.3 79.1* 80.0 79.0*  PLT 243  --   --  258 223 177 209   < > = values in this interval not displayed.    Basic Metabolic Panel: Recent Labs  Lab 06/24/20 0048 06/24/20 0347 06/24/20 0451 06/24/20 0451 06/24/20 1502 06/25/20 0316 06/26/20 0303 06/27/20 0250 06/28/20 0620  NA 138   < > 138   < > 138 140 143 144 141  K 2.6*   < > 5.3*   < > 4.6 3.9 3.1* 3.8 3.5  CL 104   < > 107   < > 108 108 112* 112* 107  CO2 18*   < > 19*   < > 20* 21* 20* 24 22  GLUCOSE 211*   < > 106*   < > 110* 99 114* 136* 124*  BUN 17   < > 17   < > 19 20 21* 16 18  CREATININE 1.76*   < > 1.94*   < > 2.46* 2.62* 2.51* 1.72* 1.48*  CALCIUM 9.1   < > 8.7*   < > 8.8* 8.8* 8.5* 9.1 9.2  MG 2.0  --  2.2  --   --   --  2.1 2.1 1.7  PHOS  --   --  1.2*  --   --  3.6  --  3.4 4.5   < > = values in this interval not displayed.    GFR: Estimated Creatinine Clearance: 54.8 mL/min (A) (by C-G formula based on SCr of 1.48 mg/dL (H)).  Liver Function Tests: Recent Labs  Lab 06/23/20 2110 06/25/20 0316  AST 20  --   ALT 9  --   ALKPHOS 103  --   BILITOT 0.4  --   PROT 6.7  --   ALBUMIN 3.1* 2.6*    CBG: Recent Labs  Lab 06/27/20 1903 06/27/20 2308 06/28/20 0307 06/28/20 0711 06/28/20 1206  GLUCAP 133* 121* 126* 119* 109*     Recent Results (from the past 240 hour(s))  Blood culture (routine x 2)     Status: None   Collection Time: 06/23/20  9:10 PM   Specimen: BLOOD  Result Value Ref Range Status   Specimen  Description BLOOD LEFT ANTECUBITAL  Final   Special Requests   Final    BOTTLES DRAWN AEROBIC AND ANAEROBIC  Blood Culture results may not be optimal due to an inadequate volume of blood received in culture bottles   Culture   Final    NO GROWTH 5 DAYS Performed at [facility] [address], [place], [place] [address]    Report Status 06/28/2020 FINAL  Final  SARS Coronavirus 2 by RT PCR (hospital order, performed in [facility] hospital lab) Nasopharyngeal Nasopharyngeal Swab     Status: None   Collection Time: 06/23/20  9:15 PM   Specimen: Nasopharyngeal Swab  Result Value Ref Range Status   SARS Coronavirus 2 NEGATIVE NEGATIVE Final    Comment: (NOTE) SARS-CoV-2 target nucleic acids are NOT DETECTED.  The SARS-CoV-2 RNA is generally detectable in upper and lower respiratory specimens during the acute phase of infection. The lowest concentration of SARS-CoV-2 viral copies this assay can detect is 250 copies / mL. A negative result does not preclude SARS-CoV-2 infection and should not be used as the sole basis for treatment or other patient management decisions.  A negative result may occur with improper specimen collection / handling, submission of specimen other than nasopharyngeal swab, presence of viral mutation(s) within the areas targeted by this assay, and inadequate number of viral copies (<250 copies / mL). A negative result must be combined with clinical observations, patient history, and epidemiological information.  Fact Sheet for Patients:   StrictlyIdeas.no  Fact Sheet for Healthcare Providers: BankingDealers.co.za  This test is not yet approved or  cleared by the [place] FDA and has been authorized for detection and/or diagnosis of SARS-CoV-2 by FDA under an Emergency Use Authorization (EUA).  This EUA will remain in effect (meaning this test can be used) for the duration of the COVID-19 declaration  under Section 564(b)(1) of the Act, 21 U.S.C. section 360bbb-3(b)(1), unless the authorization is terminated or revoked sooner.  Performed at [facility], [place] [address], [place], [place] [address]   Urine Culture     Status: None   Collection Time: 06/23/20 10:08 PM   Specimen: Urine, Random  Result Value Ref Range Status   Specimen Description URINE, RANDOM  Final   Special Requests NONE  Final   Culture   Final    NO GROWTH Performed at [facility], [place] [address], [place], [place] [address]    Report Status 06/25/2020 FINAL  Final  Blood culture (routine x 2)     Status: None (Preliminary result)   Collection Time: 06/24/20  1:03 AM   Specimen: BLOOD LEFT WRIST  Result Value Ref Range Status   Specimen Description BLOOD LEFT WRIST  Final   Special Requests   Final    BOTTLES DRAWN AEROBIC ONLY Blood Culture results may not be optimal due to an inadequate volume of blood received in culture bottles   Culture   Final    NO GROWTH 4 DAYS Performed at [facility], [place] [address], [place],  [address]    Report Status PENDING  Incomplete  MRSA PCR Screening     Status: None   Collection Time: 06/24/20 12:36 PM   Specimen: Nasal Mucosa; Nasopharyngeal  Result Value Ref Range Status   MRSA by PCR NEGATIVE NEGATIVE Final    Comment:        The GeneXpert MRSA Assay (FDA approved for NASAL specimens only), is one component of a comprehensive MRSA colonization surveillance program. It is not intended to diagnose MRSA infection nor to guide or monitor treatment for MRSA infections.  Performed at [facility], [place] [address], [place], [place] [address]          Radiology Studies: DG Chest Port 1 View  Result Date: 06/27/2020 CLINICAL DATA:  Acute respiratory failure.  Hypoxia. EXAM: PORTABLE CHEST 1 VIEW COMPARISON:  06/26/2020.  06/23/2020. FINDINGS: Interim extubation and removal of NG tube. Right base infiltrate consistent with  pneumonia. Aspiration cannot be excluded. No pleural effusion or pneumothorax. Heart size stable. No pulmonary venous congestion. No acute bony abnormality identified. IMPRESSION: Interim extubation and removal of NG tube. Right base infiltrate consistent with pneumonia. Aspiration cannot be excluded Electronically Signed   By: Marcello Moores  Register   On: 06/27/2020 06:56   DG Swallowing Func-Speech Pathology  Result Date: 06/28/2020 Objective Swallowing Evaluation: Type of Study: MBS-Modified Barium Swallow Study  Patient Details Name: Jamell Laymon MRN: [id number] Date of Birth: [birth year] Today's Date: 06/28/2020 Time: SLP Start Time (ACUTE ONLY): 1002 -SLP Stop Time (ACUTE ONLY): 1016 SLP Time Calculation (min) (ACUTE ONLY): 14 min Past Medical History: No past medical history on file. Past Surgical History: Past Surgical History: Procedure Laterality Date . CRANIOTOMY Left 09/09/2019  Procedure: DECOMPRESSIVE CRANIECTOMY AND  HEMATOMA EVACUATION, IMPLANTATION OF SKULL FLAP IN RIGHT ABDOMEN;  Surgeon: Erline Levine, MD;  Location: [facility];  Service: Neurosurgery;  Laterality: Left; . ESOPHAGOGASTRODUODENOSCOPY (EGD) WITH PROPOFOL N/A 09/28/2019  Procedure: ESOPHAGOGASTRODUODENOSCOPY (EGD) WITH PROPOFOL;  Surgeon: Georganna Skeans, MD;  Location[facility] ENDOSCOPY;  Service: General;  Laterality: N/A; . PEG PLACEMENT N/A 09/28/2019  Procedure: PERCUTANEOUS ENDOSCOPIC GASTROSTOMY (PEG) PLACEMENT;  Surgeon: Georganna Skeans, MD;  Location: [facility] ENDOSCOPY;  Service: General;  Laterality: N/A; HPI: Pt is a 39 year old man admitted on 06/23/20 from his SNF after falling OOB with break through seizure. 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 Intubated 6/27-6/30. CXR Interim extubation and removal of NG tube. Right base infiltrate consistent with pneumonia. Aspiration cannot be excluded. 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 D/C to SNF in April 2021. Pt with residual aphasia, non verbal, with R hemiplegia. Familiar to [facility]- had  two bedside swallow assessments and treatments from 09/22/19 which he refused all po's, however on 12/12/20 he was acceting of water, Coke and crackers and was started on regular/thin liquids. He intermittently refused po's and ST discharged on 01/08/20.  No data recorded Assessment / Plan / Recommendation CHL IP CLINICAL IMPRESSIONS 06/28/2020 Clinical Impression Khye demonstrated minimal oral dysphagia and functional pharyngeal dysphagia despite recent ICH, 4 day intubation and history of ICH in 2020. Piecemeal transit of thin intermittently and minimal premature spill. Overall swallow was timely with one trial of thin reaching valleculae for 4-5 seconds before swallow initiated. Trace-minimal residue in valleculae and pyriform sinuses cleairng with subequent swallows. Laryngeal vestibule was protected throughout each swallow thus preventing penetration and aspiration. He independently consumed small-medium size sips. Esophageal scan was unremarkable. A regular texture diet and thin liquids ordered, straws allowed, pills whole in puree and full supervision initially to ensure following precautions and continued ST.      SLP Visit Diagnosis Dysphagia, unspecified (R13.10) Attention and concentration deficit following -- Frontal lobe and executive function deficit following -- Impact on safety and function Mild aspiration risk;Moderate aspiration risk   CHL IP TREATMENT RECOMMENDATION 06/28/2020 Treatment Recommendations Defer until completion of intrumental exam   Prognosis 12/13/2019 Prognosis for Safe Diet Advancement Guarded Barriers to Reach Goals Behavior Barriers/Prognosis Comment -- CHL IP DIET RECOMMENDATION 01/03/2020 SLP Diet Recommendations -- Liquid Administration via --  Medication Administration -- Compensations Minimize environmental distractions Postural Changes --   CHL IP OTHER RECOMMENDATIONS 12/13/2019 Recommended Consults -- Oral Care Recommendations -- Other Recommendations Have oral suction  available   CHL IP FOLLOW UP RECOMMENDATIONS 06/28/2020 Follow up Recommendations Skilled Nursing facility   [facility] IP FREQUENCY AND DURATION 12/13/2019 Speech Therapy Frequency (ACUTE ONLY) min 2x/week Treatment Duration 2 weeks      CHL IP ORAL PHASE 06/28/2020 Oral Phase Impaired Oral - Pudding Teaspoon -- Oral - Pudding Cup -- Oral - Honey Teaspoon -- Oral - Honey Cup -- Oral - Nectar Teaspoon -- Oral - Nectar Cup -- Oral - Nectar Straw -- Oral - Thin Teaspoon -- Oral - Thin Cup Piecemeal swallowing;Premature spillage Oral - Thin Straw WFL Oral - Puree -- Oral - Mech Soft -- Oral - Regular Delayed oral transit;Other (Comment) Oral - Multi-Consistency -- Oral - Pill -- Oral Phase - Comment --  CHL IP PHARYNGEAL PHASE 06/28/2020 Pharyngeal Phase Impaired Pharyngeal- Pudding Teaspoon -- Pharyngeal -- Pharyngeal- Pudding Cup -- Pharyngeal -- Pharyngeal- Honey Teaspoon -- Pharyngeal -- Pharyngeal- Honey Cup -- Pharyngeal -- Pharyngeal- Nectar Teaspoon -- Pharyngeal -- Pharyngeal- Nectar Cup -- Pharyngeal -- Pharyngeal- Nectar Straw -- Pharyngeal -- Pharyngeal- Thin Teaspoon -- Pharyngeal -- Pharyngeal- Thin Cup Pharyngeal residue - valleculae;Pharyngeal residue - pyriform Pharyngeal -- Pharyngeal- Thin Straw Pharyngeal residue - valleculae;Pharyngeal residue - pyriform;Delayed swallow initiation-vallecula Pharyngeal -- Pharyngeal- Puree -- Pharyngeal -- Pharyngeal- Mechanical Soft -- Pharyngeal -- Pharyngeal- Regular WFL Pharyngeal -- Pharyngeal- Multi-consistency -- Pharyngeal -- Pharyngeal- Pill -- Pharyngeal -- Pharyngeal Comment --  CHL IP CERVICAL ESOPHAGEAL PHASE 06/28/2020 Cervical Esophageal Phase WFL Pudding Teaspoon -- Pudding Cup -- Honey Teaspoon -- Honey Cup -- Nectar Teaspoon -- Nectar Cup -- Nectar Straw -- Thin Teaspoon -- Thin Cup -- Thin Straw -- Puree -- Mechanical Soft -- Regular -- Multi-consistency -- Pill -- Cervical Esophageal Comment -- [name] 06/28/2020, 10:52 AM [name].Ed  Actor Pager [phone] Office [phone]                   Scheduled Meds: . amLODipine  10 mg Per Tube Daily  . chlorhexidine  15 mL Mouth Rinse BID  . Chlorhexidine Gluconate Cloth  6 each Topical Daily  . cloNIDine  0.3 mg Transdermal Weekly  . insulin aspart  0-9 Units Subcutaneous Q4H  . levETIRAcetam  750 mg Per Tube BID  . mouth rinse  15 mL Mouth Rinse q12n4p  . metoprolol tartrate  25 mg Per Tube BID   Continuous Infusions: . sodium chloride Stopped (06/28/20 0932)  . sodium chloride 10 mL/hr at 06/25/20 0941     LOS: 5 days        Hosie Poisson, MD [facility]   To contact the attending provider between 7A-7P or the covering provider during after hours 7P-7A, please log into the web site [email or web address] and access using universal [place] password for that web site. If you do not have the password, please call the hospital operator.  06/28/2020, 1:34 PM

## 2020-06-28 NOTE — Progress Notes (Addendum)
   Daily Progress Note   [name]       Date: 06/28/2020 DOB: 1981/01/24  Age: 39 y.o. MRN#: [id number] Attending Physician: Hosie Poisson, MD Primary Care Physician: Patient, [name] Admit Date: 06/23/2020  Reason for Consultation/Follow-up: Establishing goals of care  Referral received for goals of care discussion. Chart reviewed and updates received from RN. Patient assessed at the bedside. He is awake and alert. Non verbal but will gesture needs and follow some commands. No family at the bedside.   Patient is well known to the Palliative Medicine team from previous extended admission. At that time mother and family members were direct in their goals of care with clear expectations to continue with full code/full scope/full aggressive interventions and not interested in further discussions otherwise.   I have attempted to reach mother on 2 occassions today and voice message with contact information left. Per RN mother did call to department and verbalized awareness of missed calls. She confirms it is hard for her to answer and speak on the phone during her work hours. Per RN mother reports she would want full scope care including flap replacement if needed.   PMT will continue to make attempts to connect and have further goals of care discussions with mother. Also hopeful she will return call.   Thank you for allowing the Palliative Medicine Team to assist in the care of Joshua [name]. Detailed note and recommendations to follow once GOC can be conducted with mother.   1510: attempted to contact mother again. Unable to reach. Additional voicemail with contact information left.   Alda Lea, AGPCNP-BC  Palliative Medicine Team [phone]   NO CHARGE

## 2020-06-28 NOTE — Progress Notes (Signed)
Subjective: Patient reports alert, making eye contact, and tracking. He is not following commands and remains non-verbal. Awaiting transfer out of the ICU to the floor.   Objective: Vital signs in last 24 hours: Temp:  [97.4 F (36.3 C)-99.3 F (37.4 C)] 97.8 F (36.6 C) (07/02 0700) Pulse Rate:  [34-125] 34 (07/02 0600) Resp:  [13-51] 34 (07/02 0600) BP: (127-158)/(108-125) 127/108 (07/02 0600) SpO2:  [92 %-98 %] 98 % (07/02 0600) Weight:  [58.4 kg] 58.4 kg (07/02 0500)  Intake/Output from previous day: 07/01 0701 - 07/02 0700 In: 2127.3 [I.V.:262.3; NG/GT:1650; IV Piggyback:215] Out: 1021 [Urine:1675] Intake/Output this shift: No intake/output data recorded.  Physical Exam: He is alert, making eye contact, and tracking. He does not appear to be following verbal commands and appears to be able to follow simple commands through the use of pantomime. Globally aphasic. PERRL 15mm and sluggish.  He is purposeful in his LUE and able to move against gravity only. He has minimal spontaneous movement in his BLE. RUE with minimal withdrawal to noxious stimuli.  Lab Results: Recent Labs    06/26/20 0303 06/27/20 0250  WBC 11.9* 9.3  HGB 10.5* 12.0*  HCT 33.6* 37.9*  PLT 177 209   BMET Recent Labs    06/27/20 0250 06/28/20 0620  NA 144 141  K 3.8 3.5  CL 112* 107  CO2 24 22  GLUCOSE 136* 124*  BUN 16 18  CREATININE 1.72* 1.48*  CALCIUM 9.1 9.2    Studies/Results: DG Chest Port 1 View  Result Date: 06/27/2020 CLINICAL DATA:  Acute respiratory failure.  Hypoxia. EXAM: PORTABLE CHEST 1 VIEW COMPARISON:  06/26/2020.  06/23/2020. FINDINGS: Interim extubation and removal of NG tube. Right base infiltrate consistent with pneumonia. Aspiration cannot be excluded. No pleural effusion or pneumothorax. Heart size stable. No pulmonary venous congestion. No acute bony abnormality identified. IMPRESSION: Interim extubation and removal of NG tube. Right base infiltrate consistent with  pneumonia. Aspiration cannot be excluded Electronically Signed   By: Marcello Moores  Register   On: 06/27/2020 06:56    Assessment/Plan: The patient appears to be close to his baseline status. I attempted to call the patient's mother again this morning and received no answer. If the need arises(if family desires this to be done), we can replace his bone flap at his prior craniectomy site. I will consult palliative care today for assistance in decision making in the presence of lack of family involvement. Continue supportive care.Continue antiepileptics per Neurology.Call with any questions.    LOS: 5 days    Peggyann Shoals, MD 06/28/2020, 7:53 AM

## 2020-06-28 NOTE — Evaluation (Signed)
Clinical/Bedside Swallow Evaluation Patient Details  Name: Joshua [name] MRN: [id number] Date of Birth: 03/06/1981  Today's Date: 06/28/2020 Time: SLP Start Time (ACUTE ONLY): 0856 SLP Stop Time (ACUTE ONLY): 0913 SLP Time Calculation (min) (ACUTE ONLY): 17 min  Past Medical History: History reviewed. No pertinent past medical history. Past Surgical History:  Past Surgical History:  Procedure Laterality Date  . CRANIOTOMY Left 09/09/2019   Procedure: DECOMPRESSIVE CRANIECTOMY AND  HEMATOMA EVACUATION, IMPLANTATION OF SKULL FLAP IN RIGHT ABDOMEN;  Surgeon: Joshua Levine, MD;  Location: [place];  Service: Neurosurgery;  Laterality: Left;  . ESOPHAGOGASTRODUODENOSCOPY (EGD) WITH PROPOFOL N/A 09/28/2019   Procedure: ESOPHAGOGASTRODUODENOSCOPY (EGD) WITH PROPOFOL;  Surgeon: Joshua Skeans, MD;  Location: [facility] ENDOSCOPY;  Service: General;  Laterality: N/A;  . PEG PLACEMENT N/A 09/28/2019   Procedure: PERCUTANEOUS ENDOSCOPIC GASTROSTOMY (PEG) PLACEMENT;  Surgeon: Joshua Skeans, MD;  Location: [facility] ENDOSCOPY;  Service: General;  Laterality: N/A;   HPI:  Pt is a 39 year old man admitted on 06/23/20 from his SNF after falling OOB with break through seizure. 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 Intubated 6/27-6/30. CXR Interim extubation and removal of NG tube. Right base infiltrate consistent with pneumonia. Aspiration cannot be excluded. 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 D/C to SNF in April 2021. Pt with residual aphasia, non verbal, with R hemiplegia. Familiar to Joshua [name]- had two bedside swallow assessments and treatments from 09/22/19 which he refused all po's, however on 12/12/20 he was acceting of water, Coke and crackers and was started on regular/thin liquids. He intermittently refused po's and ST discharged on 01/08/20.   Assessment / Plan / Recommendation Clinical Impression  Pt familiar to ST services with baseline aphasia and history of refusing most po's throughout prior admission.  Today he readily accepted ice chip, water, applesauce and cracker. He vocalized vowel sounds which did not appear hoarse from 4 day intubation and low intensity, without grimace during swallow. 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 From subjective view, swallow onset seemed timely. There were two delayed coughs and instrumental testing needed due to increased risk of dysphagia with history of and new ICH with deconditioning. Schedule this morning at 10.   SLP Visit Diagnosis: Dysphagia, unspecified (R13.10)    Aspiration Risk  Mild aspiration risk;Moderate aspiration risk    Diet Recommendation NPO   Medication Administration: Via alternative means    Other  Recommendations Oral Care Recommendations: Oral care QID   Follow up Recommendations Skilled Nursing facility      Frequency and Duration            Prognosis        Swallow Study   General HPI: Pt is a 39 year old man admitted on 06/23/20 from his SNF after falling OOB with break through seizure. 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 Intubated 6/27-6/30. CXR Interim extubation and removal of NG tube. Right base infiltrate consistent with pneumonia. Aspiration cannot be excluded. 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 D/C to SNF in April 2021. Pt with residual aphasia, non verbal, with R hemiplegia. Familiar to Joshua [name]- had two bedside swallow assessments and treatments from 09/22/19 which he refused all po's, however on 12/12/20 he was acceting of water, Coke and crackers and was started on regular/thin liquids. He intermittently refused po's and ST discharged on 01/08/20. Type of Study: Bedside Swallow Evaluation Previous Swallow Assessment:  (see HPI) Diet Prior to this Study: NPO;PEG tube Temperature Spikes Noted: No  Respiratory Status: Room air History of Recent Intubation: Yes Length of Intubations (days):  (4) Date extubated: 06/26/20 Behavior/Cognition: Alert;Cooperative;Pleasant  mood;Distractible;Requires cueing Oral Cavity Assessment: Other (comment) (lingual candidas) Oral Care Completed by SLP: Yes Oral Cavity - Dentition: Adequate natural dentition Vision: Functional for self-feeding Self-Feeding Abilities: Able to feed self Patient Positioning: Upright in bed Baseline Vocal Quality: Low vocal intensity Volitional Cough:  (uanble-likely apraxia) Volitional Swallow: Unable to elicit    Oral/Motor/Sensory Function Overall Oral Motor/Sensory Function:  (was unable due to apraxia- no overt focal weakness)   Ice Chips Ice chips: Within functional limits Presentation: Spoon   Thin Liquid Thin Liquid: Impaired Presentation: Spoon;Cup Oral Phase Impairments: Reduced labial seal Oral Phase Functional Implications: Right anterior spillage Pharyngeal  Phase Impairments: Throat Clearing - Delayed    Nectar Thick Nectar Thick Liquid: Not tested   Honey Thick Honey Thick Liquid: Not tested   Puree Puree: Within functional limits   Solid     Solid: Within functional limits      Joshua [name] 06/28/2020,9:35 AM    Joshua [name].Ed Risk analyst [phone] Office [phone]

## 2020-06-29 LAB — GLUCOSE, CAPILLARY
Glucose-Capillary: 87 mg/dL (ref 70–99)
Glucose-Capillary: 99 mg/dL (ref 70–99)
Glucose-Capillary: 123 mg/dL — ABNORMAL HIGH (ref 70–99)
Glucose-Capillary: 116 mg/dL — ABNORMAL HIGH (ref 70–99)

## 2020-06-29 LAB — CBC
HCT: 40.4 % (ref 39.0–52.0)
Hemoglobin: 13 g/dL (ref 13.0–17.0)
MCH: 25.4 pg — ABNORMAL LOW (ref 26.0–34.0)
MCHC: 32.2 g/dL (ref 30.0–36.0)
MCV: 79.1 fL — ABNORMAL LOW (ref 80.0–100.0)
Platelets: 303 10*3/uL (ref 150–400)
RBC: 5.11 MIL/uL (ref 4.22–5.81)
RDW: 16.6 % — ABNORMAL HIGH (ref 11.5–15.5)
WBC: 7.8 10*3/uL (ref 4.0–10.5)
nRBC: 0 % (ref 0.0–0.2)

## 2020-06-29 LAB — BASIC METABOLIC PANEL
Anion gap: 9 (ref 5–15)
BUN: 21 mg/dL — ABNORMAL HIGH (ref 6–20)
CO2: 25 mmol/L (ref 22–32)
Calcium: 9.7 mg/dL (ref 8.9–10.3)
Chloride: 104 mmol/L (ref 98–111)
Creatinine, Ser: 1.46 mg/dL — ABNORMAL HIGH (ref 0.61–1.24)
GFR calc Af Amer: >60 mL/min (ref >60)
GFR calc non Af Amer: 59 mL/min — ABNORMAL LOW (ref >60)
Glucose, Bld: 106 mg/dL — ABNORMAL HIGH (ref 70–99)
Potassium: 3.9 mmol/L (ref 3.5–5.1)
Sodium: 138 mmol/L (ref 135–145)

## 2020-06-29 LAB — CULTURE, BLOOD (ROUTINE X 2): Culture: NO GROWTH

## 2020-06-29 MED ORDER — BACLOFEN 10 MG PO TABS
10.0000 mg | ORAL_TABLET | Freq: Every day | ORAL | Status: DC
  Administered 2020-06-29 – 2020-07-02 (×4): 10 mg
  Filled 2020-06-29 (×6): qty 1

## 2020-06-29 NOTE — Progress Notes (Signed)
PROGRESS NOTE    Joshua [name]  NOB:[id number] DOB: [birth year] DOA: 06/23/2020 PCP: Patient, [name]   Chief Complaint  Patient presents with  . 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  On arrival to ED was intubated for airway protection.  CT of the head shows new left frontal ICH.  He had an episode of tachycardia in the ED which was treated with adenosine.  He was extubated on 06/26/2020.  Neurology consulted and recommendations given.  Neurosurgery on board.  Patient was transferred from [facility] service to [facility] on 06/28/2020. Pt seen and examined, no new complaints.   Assessment & Plan:   Active Problems:   Intracranial hemorrhage (HCC)   Seizure (HCC)   Acute left frontal intracranial hemorrhage and breakthrough seizures with subsequent acute respiratory failure in the setting of prior left hemispheric intracranial hemorrhage.  History of craniotomy and decompression craniectomy.   Patient was initially loaded with Keppra in the ED and evaluated by neurology.  Transitioned to oral Keppra.  Continuous EEG completed which does not show any overt seizures but was found to have epileptiform focus in the left frontal region.  Neurosurgery on board and possible consideration for bone flap replacement/ cranioplasty next week.  Continue to monitor mental status. Patient appears to be at baseline at this time. He is non verbal, able to move his upper extremity.    Acute respiratory failure with hypoxia secondary to seizures s/p intubation and extubated on 06/26/2020. Continue with pulmonary hygiene. Pt is on RA, with good sats.    Possible aspiration event Chest  x-ray on 6/30 does not show any infiltrate.  Completed 3 days of Unasyn. Pt afebrile, no Leukocytosis.     Hypertensive urgency. BP parameters are better today. Continue with norvasc, metoprolol, hydralazine, and clonidione.    Hypokalemia Replaced.    Lactic acidosis.  Resolved.  Probably from seizures.    AKI;  probably from dehydration. Improving.  Creatinine on admission is 2.62 improved to 1.46   DVT prophylaxis: scd's Code Status: FULL CODE.  Family Communication: None at bedside.  Disposition:   Status is: Inpatient  Remains inpatient appropriate because:Ongoing diagnostic testing needed not appropriate for outpatient work up   Dispo: The patient is from: SNF              Anticipated d/c is to: SNF              Anticipated d/c date is: > 3 days              Patient currently is not medically stable to d/c.       Consultants:   Neurosurgery  PCCM   Procedures: none.    Antimicrobials: none.    Subjective: Pt is non verbal.   Objective: Vitals:   06/29/20 0636 06/29/20 0821 06/29/20 1303 06/29/20 1650  BP: (!) 149/120 (!) 126/105 119/90 (!) 123/93  Pulse: (!) 104 (!) 104 99 (!) 104  Resp: 19  18   Temp: 97.6 F (36.4 C) 97.9 F (36.6 C) 97.9 F (36.6 C) 98.2 F (36.8 C)  TempSrc: Oral Oral Oral Oral  SpO2: 97% 98% 96% 98%  Weight:      Height:  Intake/Output Summary (Last 24 hours) at 06/29/2020 1657 Last data filed at 06/29/2020 1600 Gross per 24 hour  Intake 741.69 ml  Output 775 ml  Net -33.31 ml   Filed Weights   06/27/20 0336 06/28/20 0500 06/29/20 0500  Weight: 60.3 kg 58.4 kg 57.3 kg    Examination:  General exam: Alert and comfortable, no distress noted Respiratory system: Clear to auscultation bilaterally, no wheezing or rhonchi Cardiovascular system: S1-S2 heard, regular rate rhythm, no JVD, no pedal edema Gastrointestinal system: Abdomen is soft, nontender, nondistended, bowel sounds normal, G-tube in  place. Central nervous system: Alert, patient nonverbal Extremities: No pedal edema Skin: No rashes seen Psychiatry:  Cannot be assessed.     Data Reviewed: I have personally reviewed following labs and imaging studies  CBC: Recent Labs  Lab 06/24/20 0451 06/25/20 0316 06/26/20 0303 06/27/20 0250 06/29/20 0500  WBC 13.7* 13.7* 11.9* 9.3 7.8  HGB 14.1 12.8* 10.5* 12.0* 13.0  HCT 45.1 39.8 33.6* 37.9* 40.4  MCV 81.3 79.1* 80.0 79.0* 79.1*  PLT 258 223 177 209 333    Basic Metabolic Panel: Recent Labs  Lab 06/24/20 0048 06/24/20 0347 06/24/20 0451 06/24/20 1502 06/25/20 0316 06/26/20 0303 06/27/20 0250 06/28/20 0620 06/29/20 0500  NA 138   < > 138   < > 140 143 144 141 138  K 2.6*   < > 5.3*   < > 3.9 3.1* 3.8 3.5 3.9  CL 104   < > 107   < > 108 112* 112* 107 104  CO2 18*   < > 19*   < > 21* 20* 24 22 25   GLUCOSE 211*   < > 106*   < > 99 114* 136* 124* 106*  BUN 17   < > 17   < > 20 21* 16 18 21*  CREATININE 1.76*   < > 1.94*   < > 2.62* 2.51* 1.72* 1.48* 1.46*  CALCIUM 9.1   < > 8.7*   < > 8.8* 8.5* 9.1 9.2 9.7  MG 2.0  --  2.2  --   --  2.1 2.1 1.7  --   PHOS  --   --  1.2*  --  3.6  --  3.4 4.5  --    < > = values in this interval not displayed.    GFR: Estimated Creatinine Clearance: 54.5 mL/min (A) (by C-G formula based on SCr of 1.46 mg/dL (H)).  Liver Function Tests: Recent Labs  Lab 06/23/20 2110 06/25/20 0316  AST 20  --   ALT 9  --   ALKPHOS 103  --   BILITOT 0.4  --   PROT 6.7  --   ALBUMIN 3.1* 2.6*    CBG: Recent Labs  Lab 06/28/20 1950 06/29/20 0106 06/29/20 0400 06/29/20 0746 06/29/20 1134  GLUCAP 113* 87 99 123* 116*     Recent Results (from the past 240 hour(s))  Blood culture (routine x 2)     Status: None   Collection Time: 06/23/20  9:10 PM   Specimen: BLOOD  Result Value Ref Range Status   Specimen Description BLOOD LEFT ANTECUBITAL  Final   Special Requests   Final    BOTTLES DRAWN AEROBIC AND ANAEROBIC Blood  Culture results may not be optimal due to an inadequate volume of blood received in culture bottles   Culture   Final    NO GROWTH 5 DAYS Performed at [facility], [place] [address], [place], [place] [address]  Report Status 06/28/2020 FINAL  Final  SARS Coronavirus 2 by RT PCR (hospital order, performed in [facility] hospital lab) Nasopharyngeal Nasopharyngeal Swab     Status: None   Collection Time: 06/23/20  9:15 PM   Specimen: Nasopharyngeal Swab  Result Value Ref Range Status   SARS Coronavirus 2 NEGATIVE NEGATIVE Final    Comment: (NOTE) SARS-CoV-2 target nucleic acids are NOT DETECTED.  The SARS-CoV-2 RNA is generally detectable in upper and lower respiratory specimens during the acute phase of infection. The lowest concentration of SARS-CoV-2 viral copies this assay can detect is 250 copies / mL. A negative result does not preclude SARS-CoV-2 infection and should not be used as the sole basis for treatment or other patient management decisions.  A negative result may occur with improper specimen collection / handling, submission of specimen other than nasopharyngeal swab, presence of viral mutation(s) within the areas targeted by this assay, and inadequate number of viral copies (<250 copies / mL). A negative result must be combined with clinical observations, patient history, and epidemiological information.  Fact Sheet for Patients:   StrictlyIdeas.no  Fact Sheet for Healthcare Providers: BankingDealers.co.za  This test is not yet approved or  cleared by the [place] FDA and has been authorized for detection and/or diagnosis of SARS-CoV-2 by FDA under an Emergency Use Authorization (EUA).  This EUA will remain in effect (meaning this test can be used) for the duration of the COVID-19 declaration under Section 564(b)(1) of the Act, 21 U.S.C. section 360bbb-3(b)(1), unless the authorization is terminated  or revoked sooner.  Performed at [facility], [place] [address] [place], [place] [address]   Urine Culture     Status: None   Collection Time: 06/23/20 10:08 PM   Specimen: Urine, Random  Result Value Ref Range Status   Specimen Description URINE, RANDOM  Final   Special Requests NONE  Final   Culture   Final    NO GROWTH Performed at [facility], [place] [address] [place], [place] [address]    Report Status 06/25/2020 FINAL  Final  Blood culture (routine x 2)     Status: None   Collection Time: 06/24/20  1:03 AM   Specimen: BLOOD LEFT WRIST  Result Value Ref Range Status   Specimen Description BLOOD LEFT WRIST  Final   Special Requests   Final    BOTTLES DRAWN AEROBIC ONLY Blood Culture results may not be optimal due to an inadequate volume of blood received in culture bottles   Culture   Final    NO GROWTH 5 DAYS Performed at [facility], [place] [address] [place], [place] [address]    Report Status 06/29/2020 FINAL  Final  MRSA PCR Screening     Status: None   Collection Time: 06/24/20 12:36 PM   Specimen: Nasal Mucosa; Nasopharyngeal  Result Value Ref Range Status   MRSA by PCR NEGATIVE NEGATIVE Final    Comment:        The GeneXpert MRSA Assay (FDA approved for NASAL specimens only), is one component of a comprehensive MRSA colonization surveillance program. It is not intended to diagnose MRSA infection nor to guide or monitor treatment for MRSA infections. Performed at [facility], [place] [address] [place], [place] [address]          Radiology Studies: DG Swallowing Func-Speech Pathology  Result Date: 06/28/2020 Objective Swallowing Evaluation: Type of Study: MBS-Modified Barium Swallow Study  Patient Details Name: Rayjon Wery  MRN: [id number] Date of Birth: December 16, 1981 Today's Date: 06/28/2020 Time: SLP Start Time (ACUTE ONLY): 1002 -SLP Stop Time (ACUTE ONLY): 1016 SLP Time Calculation (min) (ACUTE ONLY): 14 min Past Medical History: No  past medical history on file. Past Surgical History: Past Surgical History: Procedure Laterality Date . CRANIOTOMY Left 09/09/2019  Procedure: DECOMPRESSIVE CRANIECTOMY AND  HEMATOMA EVACUATION, IMPLANTATION OF SKULL FLAP IN RIGHT ABDOMEN;  Surgeon: Erline Levine, MD;  Location: [place];  Service: Neurosurgery;  Laterality: Left; . ESOPHAGOGASTRODUODENOSCOPY (EGD) WITH PROPOFOL N/A 09/28/2019  Procedure: ESOPHAGOGASTRODUODENOSCOPY (EGD) WITH PROPOFOL;  Surgeon: Georganna Skeans, MD;  Location: [facility] ENDOSCOPY;  Service: General;  Laterality: N/A; . PEG PLACEMENT N/A 09/28/2019  Procedure: PERCUTANEOUS ENDOSCOPIC GASTROSTOMY (PEG) PLACEMENT;  Surgeon: Georganna Skeans, MD;  Location: [facility] ENDOSCOPY;  Service: General;  Laterality: N/A; HPI: Pt is a 39 year old man admitted on 06/23/20 from his SNF after falling OOB with break through seizure. 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 Intubated 6/27-6/30. CXR Interim extubation and removal of NG tube. Right base infiltrate consistent with pneumonia. Aspiration cannot be excluded. 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 D/C to SNF in April 2021. Pt with residual aphasia, non verbal, with R hemiplegia. Familiar to [place]- had two bedside swallow assessments and treatments from 09/22/19 which he refused all po's, however on 12/12/20 he was acceting of water, Coke and crackers and was started on regular/thin liquids. He intermittently refused po's and ST discharged on 01/08/20.  No data recorded Assessment / Plan / Recommendation CHL IP CLINICAL IMPRESSIONS 06/28/2020 Clinical Impression Melroy demonstrated minimal oral dysphagia and functional pharyngeal dysphagia despite recent ICH, 4 day intubation and history of ICH in 2020. Piecemeal transit of thin intermittently and minimal premature spill. Overall swallow was timely with one trial of thin reaching valleculae for 4-5 seconds before swallow initiated. Trace-minimal residue in valleculae and pyriform sinuses cleairng with  subequent swallows. Laryngeal vestibule was protected throughout each swallow thus preventing penetration and aspiration. He independently consumed small-medium size sips. Esophageal scan was unremarkable. A regular texture diet and thin liquids ordered, straws allowed, pills whole in puree and full supervision initially to ensure following precautions and continued ST.      SLP Visit Diagnosis Dysphagia, unspecified (R13.10) Attention and concentration deficit following -- Frontal lobe and executive function deficit following -- Impact on safety and function Mild aspiration risk;Moderate aspiration risk   CHL IP TREATMENT RECOMMENDATION 06/28/2020 Treatment Recommendations Defer until completion of intrumental exam   Prognosis 12/13/2019 Prognosis for Safe Diet Advancement Guarded Barriers to Reach Goals Behavior Barriers/Prognosis Comment -- CHL IP DIET RECOMMENDATION 01/03/2020 SLP Diet Recommendations -- Liquid Administration via -- Medication Administration -- Compensations Minimize environmental distractions Postural Changes --   CHL IP OTHER RECOMMENDATIONS 12/13/2019 Recommended Consults -- Oral Care Recommendations -- Other Recommendations Have oral suction available   CHL IP FOLLOW UP RECOMMENDATIONS 06/28/2020 Follow up Recommendations Skilled Nursing facility   [facility] IP FREQUENCY AND DURATION 12/13/2019 Speech Therapy Frequency (ACUTE ONLY) min 2x/week Treatment Duration 2 weeks      CHL IP ORAL PHASE 06/28/2020 Oral Phase Impaired Oral - Pudding Teaspoon -- Oral - Pudding Cup -- Oral - Honey Teaspoon -- Oral - Honey Cup -- Oral - Nectar Teaspoon -- Oral - Nectar Cup -- Oral - Nectar Straw -- Oral - Thin Teaspoon -- Oral - Thin Cup Piecemeal swallowing;Premature spillage Oral - Thin Straw WFL Oral - Puree -- Oral - Mech Soft -- Oral - Regular Delayed oral transit;Other (  Comment) Oral - Multi-Consistency -- Oral - Pill -- Oral Phase - Comment --  CHL IP PHARYNGEAL PHASE 06/28/2020 Pharyngeal Phase Impaired  Pharyngeal- Pudding Teaspoon -- Pharyngeal -- Pharyngeal- Pudding Cup -- Pharyngeal -- Pharyngeal- Honey Teaspoon -- Pharyngeal -- Pharyngeal- Honey Cup -- Pharyngeal -- Pharyngeal- Nectar Teaspoon -- Pharyngeal -- Pharyngeal- Nectar Cup -- Pharyngeal -- Pharyngeal- Nectar Straw -- Pharyngeal -- Pharyngeal- Thin Teaspoon -- Pharyngeal -- Pharyngeal- Thin Cup Pharyngeal residue - valleculae;Pharyngeal residue - pyriform Pharyngeal -- Pharyngeal- Thin Straw Pharyngeal residue - valleculae;Pharyngeal residue - pyriform;Delayed swallow initiation-vallecula Pharyngeal -- Pharyngeal- Puree -- Pharyngeal -- Pharyngeal- Mechanical Soft -- Pharyngeal -- Pharyngeal- Regular WFL Pharyngeal -- Pharyngeal- Multi-consistency -- Pharyngeal -- Pharyngeal- Pill -- Pharyngeal -- Pharyngeal Comment --  CHL IP CERVICAL ESOPHAGEAL PHASE 06/28/2020 Cervical Esophageal Phase WFL Pudding Teaspoon -- Pudding Cup -- Honey Teaspoon -- Honey Cup -- Nectar Teaspoon -- Nectar Cup -- Nectar Straw -- Thin Teaspoon -- Thin Cup -- Thin Straw -- Puree -- Mechanical Soft -- Regular -- Multi-consistency -- Pill -- Cervical Esophageal Comment -- [name] 06/28/2020, 10:52 AM [name].Ed Actor Pager [phone] Office [phone]                   Scheduled Meds: . amLODipine  10 mg Per Tube Daily  . baclofen  10 mg Per Tube QHS  . chlorhexidine  15 mL Mouth Rinse BID  . Chlorhexidine Gluconate Cloth  6 each Topical Daily  . cloNIDine  0.3 mg Transdermal Weekly  . hydrALAZINE  25 mg Oral Q8H  . levETIRAcetam  750 mg Per Tube BID  . mouth rinse  15 mL Mouth Rinse q12n4p  . metoprolol tartrate  25 mg Per Tube BID   Continuous Infusions: . sodium chloride Stopped (06/28/20 0932)  . sodium chloride 10 mL/hr at 06/25/20 0941     LOS: 6 days        Hosie Poisson, MD [facility]   To contact the attending provider between 7A-7P or the covering provider during after hours 7P-7A,  please log into the web site [email or web address] and access using universal [place] password for that web site. If you do not have the password, please call the hospital operator.  06/29/2020, 4:57 PM

## 2020-06-29 NOTE — Plan of Care (Signed)

## 2020-06-29 NOTE — Progress Notes (Signed)
Subjective: NAEs  Objective: Vital signs in last 24 hours: Temp:  [97.6 F (36.4 C)-98.1 F (36.7 C)] 97.9 F (36.6 C) (07/03 0821) Pulse Rate:  [90-110] 104 (07/03 0821) Resp:  [19-33] 19 (07/03 0636) BP: (126-153)/(105-128) 126/105 (07/03 0821) SpO2:  [96 %-100 %] 98 % (07/03 0821) Weight:  [57.3 kg] 57.3 kg (07/03 0500)  Intake/Output from previous day: 07/02 0701 - 07/03 0700 In: 625.4 [P.O.:150; I.V.:25.4; NG/GT:450] Out: 975 [Urine:975] Intake/Output this shift: Total I/O In: 240 [P.O.:240] Out: -   Heis alert, making eye contact, and tracking. Globally aphasic. PERRL 51mm and sluggish. Localizes LUE  Lab Results: Recent Labs    06/27/20 0250 06/29/20 0500  WBC 9.3 7.8  HGB 12.0* 13.0  HCT 37.9* 40.4  PLT 209 303   BMET Recent Labs    06/28/20 0620 06/29/20 0500  NA 141 138  K 3.5 3.9  CL 107 104  CO2 22 25  GLUCOSE 124* 106*  BUN 18 21*  CREATININE 1.48* 1.46*  CALCIUM 9.2 9.7    Studies/Results: DG Swallowing Func-Speech Pathology  Result Date: 06/28/2020 Objective Swallowing Evaluation: Type of Study: MBS-Modified Barium Swallow Study  Patient Details Name: Joshua [name] MRN: [id number] Date of Birth: [birth year] Today's Date: 06/28/2020 Time: SLP Start Time (ACUTE ONLY): 1002 -SLP Stop Time (ACUTE ONLY): 1016 SLP Time Calculation (min) (ACUTE ONLY): 14 min Past Medical History: No past medical history on file. Past Surgical History: Past Surgical History: Procedure Laterality Date . CRANIOTOMY Left 09/09/2019  Procedure: DECOMPRESSIVE CRANIECTOMY AND  HEMATOMA EVACUATION, IMPLANTATION OF SKULL FLAP IN RIGHT ABDOMEN;  Surgeon: Erline Levine, MD;  Location: [facility];  Service: Neurosurgery;  Laterality: Left; . ESOPHAGOGASTRODUODENOSCOPY (EGD) WITH PROPOFOL N/A 09/28/2019  Procedure: ESOPHAGOGASTRODUODENOSCOPY (EGD) WITH PROPOFOL;  Surgeon: Georganna Skeans, MD;  Location: [facility] ENDOSCOPY;  Service: General;  Laterality: N/A; . PEG PLACEMENT N/A 09/28/2019  Procedure:  PERCUTANEOUS ENDOSCOPIC GASTROSTOMY (PEG) PLACEMENT;  Surgeon: Georganna Skeans, MD;  Location: [facility] ENDOSCOPY;  Service: General;  Laterality: N/A; HPI: Pt is a 39 year old man admitted on 06/23/20 from his SNF after falling OOB with break through seizure. 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 Intubated 6/27-6/30. CXR Interim extubation and removal of NG tube. Right base infiltrate consistent with pneumonia. Aspiration cannot be excluded. 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 D/C to SNF in April 2021. Pt with residual aphasia, non verbal, with R hemiplegia. Familiar to [place]- had two bedside swallow assessments and treatments from 09/22/19 which he refused all po's, however on 12/12/20 he was acceting of water, Coke and crackers and was started on regular/thin liquids. He intermittently refused po's and ST discharged on 01/08/20.  No data recorded Assessment / Plan / Recommendation CHL IP CLINICAL IMPRESSIONS 06/28/2020 Clinical Impression [name] demonstrated minimal oral dysphagia and functional pharyngeal dysphagia despite recent ICH, 4 day intubation and history of ICH in 2020. Piecemeal transit of thin intermittently and minimal premature spill. Overall swallow was timely with one trial of thin reaching valleculae for 4-5 seconds before swallow initiated. Trace-minimal residue in valleculae and pyriform sinuses cleairng with subequent swallows. Laryngeal vestibule was protected throughout each swallow thus preventing penetration and aspiration. He independently consumed small-medium size sips. Esophageal scan was unremarkable. A regular texture diet and thin liquids ordered, straws allowed, pills whole in puree and full supervision initially to ensure following precautions and continued ST.      SLP Visit Diagnosis Dysphagia, unspecified (R13.10) Attention and concentration deficit following -- Frontal lobe and  executive function deficit following -- Impact on safety and function Mild aspiration  risk;Moderate aspiration risk   CHL IP TREATMENT RECOMMENDATION 06/28/2020 Treatment Recommendations Defer until completion of intrumental exam   Prognosis 12/13/2019 Prognosis for Safe Diet Advancement Guarded Barriers to Reach Goals Behavior Barriers/Prognosis Comment -- CHL IP DIET RECOMMENDATION 01/03/2020 SLP Diet Recommendations -- Liquid Administration via -- Medication Administration -- Compensations Minimize environmental distractions Postural Changes --   CHL IP OTHER RECOMMENDATIONS 12/13/2019 Recommended Consults -- Oral Care Recommendations -- Other Recommendations Have oral suction available   CHL IP FOLLOW UP RECOMMENDATIONS 06/28/2020 Follow up Recommendations Skilled Nursing facility   [facility] IP FREQUENCY AND DURATION 12/13/2019 Speech Therapy Frequency (ACUTE ONLY) min 2x/week Treatment Duration 2 weeks      CHL IP ORAL PHASE 06/28/2020 Oral Phase Impaired Oral - Pudding Teaspoon -- Oral - Pudding Cup -- Oral - Honey Teaspoon -- Oral - Honey Cup -- Oral - Nectar Teaspoon -- Oral - Nectar Cup -- Oral - Nectar Straw -- Oral - Thin Teaspoon -- Oral - Thin Cup Piecemeal swallowing;Premature spillage Oral - Thin Straw WFL Oral - Puree -- Oral - Mech Soft -- Oral - Regular Delayed oral transit;Other (Comment) Oral - Multi-Consistency -- Oral - Pill -- Oral Phase - Comment --  CHL IP PHARYNGEAL PHASE 06/28/2020 Pharyngeal Phase Impaired Pharyngeal- Pudding Teaspoon -- Pharyngeal -- Pharyngeal- Pudding Cup -- Pharyngeal -- Pharyngeal- Honey Teaspoon -- Pharyngeal -- Pharyngeal- Honey Cup -- Pharyngeal -- Pharyngeal- Nectar Teaspoon -- Pharyngeal -- Pharyngeal- Nectar Cup -- Pharyngeal -- Pharyngeal- Nectar Straw -- Pharyngeal -- Pharyngeal- Thin Teaspoon -- Pharyngeal -- Pharyngeal- Thin Cup Pharyngeal residue - valleculae;Pharyngeal residue - pyriform Pharyngeal -- Pharyngeal- Thin Straw Pharyngeal residue - valleculae;Pharyngeal residue - pyriform;Delayed swallow initiation-vallecula Pharyngeal -- Pharyngeal- Puree  -- Pharyngeal -- Pharyngeal- Mechanical Soft -- Pharyngeal -- Pharyngeal- Regular WFL Pharyngeal -- Pharyngeal- Multi-consistency -- Pharyngeal -- Pharyngeal- Pill -- Pharyngeal -- Pharyngeal Comment --  CHL IP CERVICAL ESOPHAGEAL PHASE 06/28/2020 Cervical Esophageal Phase WFL Pudding Teaspoon -- Pudding Cup -- Honey Teaspoon -- Honey Cup -- Nectar Teaspoon -- Nectar Cup -- Nectar Straw -- Thin Teaspoon -- Thin Cup -- Thin Straw -- Puree -- Mechanical Soft -- Regular -- Multi-consistency -- Pill -- Cervical Esophageal Comment -- [name] 06/28/2020, 10:52 AM [name].Ed Actor Pager [phone] Office [phone]               Assessment/Plan: Planning for cranioplasty with Dr. Vertell Limber this upcoming week possibly   [name] 06/29/2020, 11:37 AM

## 2020-06-29 NOTE — Progress Notes (Signed)
Pt arrived on unit 0635. Pt currently stable. No communication, few head gestures. VS good. Will continue to monitor.

## 2020-06-30 NOTE — Plan of Care (Signed)
  Problem: Education: Goal: Knowledge of General Education information will improve Description: Including pain rating scale, medication(s)/side effects and non-pharmacologic comfort measures Outcome: Progressing   Problem: Health Behavior/Discharge Planning: Goal: Ability to manage health-related needs will improve Outcome: Progressing   Problem: Activity: Goal: Risk for activity intolerance will decrease Outcome: Progressing   Problem: Nutrition: Goal: Adequate nutrition will be maintained Outcome: Progressing   Problem: Coping: Goal: Level of anxiety will decrease Outcome: Progressing   Problem: Elimination: Goal: Will not experience complications related to bowel motility Outcome: Progressing   Problem: Pain Managment: Goal: General experience of comfort will improve Outcome: Progressing   Problem: Safety: Goal: Ability to remain free from injury will improve Outcome: Progressing   

## 2020-06-30 NOTE — Progress Notes (Signed)
Subjective: NAEs o/n  Objective: Vital signs in last 24 hours: Temp:  [97.5 F (36.4 C)-98.8 F (37.1 C)] 98.8 F (37.1 C) (07/04 0830) Pulse Rate:  [95-104] 99 (07/04 0830) Resp:  [14-18] 15 (07/04 0830) BP: (115-131)/(89-104) 122/89 (07/04 0830) SpO2:  [96 %-98 %] 97 % (07/04 0830)  Intake/Output from previous day: 07/03 0701 - 07/04 0700 In: 861.7 [P.O.:480; I.V.:381.7] Out: -  Intake/Output this shift: Total I/O In: 120 [P.O.:120] Out: -   Eyes open, abulic, aphasic.  Trzcks, localizes UEs  Lab Results: Recent Labs    06/29/20 0500  WBC 7.8  HGB 13.0  HCT 40.4  PLT 303   BMET Recent Labs    06/28/20 0620 06/29/20 0500  NA 141 138  K 3.5 3.9  CL 107 104  CO2 22 25  GLUCOSE 124* 106*  BUN 18 21*  CREATININE 1.48* 1.46*  CALCIUM 9.2 9.7    Studies/Results: No results found.  Assessment/Plan: - awaiting cranioplasty   Joshua [name] 06/30/2020, 11:28 AM

## 2020-06-30 NOTE — Progress Notes (Signed)
PROGRESS NOTE    Joshua [name]  TOI:[id number] DOB: 03-29-1981 DOA: 06/23/2020 PCP: Patient, [name]   Chief Complaint  Patient presents with  . 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  On arrival to ED was intubated for airway protection.  CT of the head shows new left frontal ICH.  He had an episode of tachycardia in the ED which was treated with adenosine.  He was extubated on 06/26/2020.  Neurology consulted and recommendations given.  Neurosurgery on board.  Patient was transferred from [facility] service to [facility] on 06/28/2020. Patient seen and examined today, waiting for cranioplasty.   Assessment & Plan:   Active Problems:   Intracranial hemorrhage (HCC)   Seizure (HCC)   Acute left frontal intracranial hemorrhage and breakthrough seizures with subsequent acute respiratory failure in the setting of prior left hemispheric intracranial hemorrhage.  History of craniotomy and decompression craniectomy.   Patient was initially loaded with Keppra in the ED and evaluated by neurology.  Transitioned to oral Keppra.  Continuous EEG completed which does not show any overt seizures but was found to have epileptiform focus in the left frontal region.  Neurosurgery on board and possible consideration for bone flap replacement/ cranioplasty next week.  Continue to monitor his mental status Patient appears to be at baseline at this time. He is non verbal, able to move his upper extremity.    Acute respiratory failure with hypoxia secondary to seizures s/p intubation and extubated on 06/26/2020. Continue with pulmonary hygiene.  Patient is on room air with good  sats.   Possible aspiration event Chest x-ray on 6/30 does not show any infiltrate.  Completed 3 days of Unasyn. Currently afebrile, no leukocytosis, does not appear to have shortness of breath    Hypertensive urgency. Blood pressure parameters are well controlled, continue with norvasc, metoprolol, hydralazine, and clonidione.    Hypokalemia Replaced   Lactic acidosis.  Resolved.  Probably from seizures.    AKI;  probably from dehydration. Improving.  Creatinine on admission is 2.62 improved to 1.46. Repeat BMP in the morning   DVT prophylaxis: scd's Code Status: FULL CODE.  Family Communication: None at bedside.  Disposition:   Status is: Inpatient  Remains inpatient appropriate because:Ongoing diagnostic testing needed not appropriate for outpatient work up   Dispo: The patient is from: SNF              Anticipated d/c is to: SNF              Anticipated d/c date is: > 3 days              Patient currently is not medically stable to d/c.       Consultants:   Neurosurgery  PCCM   Procedures: none.    Antimicrobials: none.    Subjective: Patient is nonverbal  Objective: Vitals:   06/29/20 2151 06/30/20 0300 06/30/20 0502 06/30/20 0830  BP: (!) 127/104 (!) 115/93 (!) 118/92 122/89  Pulse: 98 97 95 99  Resp:  14  15  Temp:  (!) 97.5 F (36.4 C)  98.8 F (37.1 C)  TempSrc:  Oral  Oral  SpO2:  98%  97%  Weight:      Height:  Intake/Output Summary (Last 24 hours) at 06/30/2020 0851 Last data filed at 06/30/2020 0600 Gross per 24 hour  Intake 861.69 ml  Output --  Net 861.69 ml   Filed Weights   06/27/20 0336 06/28/20 0500 06/29/20 0500  Weight: 60.3 kg 58.4 kg 57.3 kg    Examination:  General exam: Alert and comfortable, no distress noted Respiratory system: Clear to auscultation bilaterally, no wheezing or rhonchi Cardiovascular system: S1-S2 heard, regular rate rhythm, no JVD, no pedal edema Gastrointestinal system:  Abdomen is soft, not distended, bowel sounds normal, G-tube in place Central nervous system: Alert, patient is nonverbal Extremities: No pedal edema Skin: No rashes evident Psychiatry: Cannot be assessed    Data Reviewed: I have personally reviewed following labs and imaging studies  CBC: Recent Labs  Lab 06/24/20 0451 06/25/20 0316 06/26/20 0303 06/27/20 0250 06/29/20 0500  WBC 13.7* 13.7* 11.9* 9.3 7.8  HGB 14.1 12.8* 10.5* 12.0* 13.0  HCT 45.1 39.8 33.6* 37.9* 40.4  MCV 81.3 79.1* 80.0 79.0* 79.1*  PLT 258 223 177 209 671    Basic Metabolic Panel: Recent Labs  Lab 06/24/20 0048 06/24/20 0347 06/24/20 0451 06/24/20 1502 06/25/20 0316 06/26/20 0303 06/27/20 0250 06/28/20 0620 06/29/20 0500  NA 138   < > 138   < > 140 143 144 141 138  K 2.6*   < > 5.3*   < > 3.9 3.1* 3.8 3.5 3.9  CL 104   < > 107   < > 108 112* 112* 107 104  CO2 18*   < > 19*   < > 21* 20* 24 22 25   GLUCOSE 211*   < > 106*   < > 99 114* 136* 124* 106*  BUN 17   < > 17   < > 20 21* 16 18 21*  CREATININE 1.76*   < > 1.94*   < > 2.62* 2.51* 1.72* 1.48* 1.46*  CALCIUM 9.1   < > 8.7*   < > 8.8* 8.5* 9.1 9.2 9.7  MG 2.0  --  2.2  --   --  2.1 2.1 1.7  --   PHOS  --   --  1.2*  --  3.6  --  3.4 4.5  --    < > = values in this interval not displayed.    GFR: Estimated Creatinine Clearance: 54.5 mL/min (A) (by C-G formula based on SCr of 1.46 mg/dL (H)).  Liver Function Tests: Recent Labs  Lab 06/23/20 2110 06/25/20 0316  AST 20  --   ALT 9  --   ALKPHOS 103  --   BILITOT 0.4  --   PROT 6.7  --   ALBUMIN 3.1* 2.6*    CBG: Recent Labs  Lab 06/28/20 1950 06/29/20 0106 06/29/20 0400 06/29/20 0746 06/29/20 1134  GLUCAP 113* 87 99 123* 116*     Recent Results (from the past 240 hour(s))  Blood culture (routine x 2)     Status: None   Collection Time: 06/23/20  9:10 PM   Specimen: BLOOD  Result Value Ref Range Status   Specimen Description BLOOD LEFT ANTECUBITAL  Final   Special  Requests   Final    BOTTLES DRAWN AEROBIC AND ANAEROBIC Blood Culture results may not be optimal due to an inadequate volume of blood received in culture bottles   Culture   Final    NO GROWTH 5 DAYS Performed at [facility], [place] [address] [place], [place] [address]  Report Status 06/28/2020 FINAL  Final  SARS Coronavirus 2 by RT PCR (hospital order, performed in [facility] hospital lab) Nasopharyngeal Nasopharyngeal Swab     Status: None   Collection Time: 06/23/20  9:15 PM   Specimen: Nasopharyngeal Swab  Result Value Ref Range Status   SARS Coronavirus 2 NEGATIVE NEGATIVE Final    Comment: (NOTE) SARS-CoV-2 target nucleic acids are NOT DETECTED.  The SARS-CoV-2 RNA is generally detectable in upper and lower respiratory specimens during the acute phase of infection. The lowest concentration of SARS-CoV-2 viral copies this assay can detect is 250 copies / mL. A negative result does not preclude SARS-CoV-2 infection and should not be used as the sole basis for treatment or other patient management decisions.  A negative result may occur with improper specimen collection / handling, submission of specimen other than nasopharyngeal swab, presence of viral mutation(s) within the areas targeted by this assay, and inadequate number of viral copies (<250 copies / mL). A negative result must be combined with clinical observations, patient history, and epidemiological information.  Fact Sheet for Patients:   StrictlyIdeas.no  Fact Sheet for Healthcare Providers: BankingDealers.co.za  This test is not yet approved or  cleared by the [place] FDA and has been authorized for detection and/or diagnosis of SARS-CoV-2 by FDA under an Emergency Use Authorization (EUA).  This EUA will remain in effect (meaning this test can be used) for the duration of the COVID-19 declaration under Section 564(b)(1) of the Act, 21 U.S.C. section  360bbb-3(b)(1), unless the authorization is terminated or revoked sooner.  Performed at [facility], [place] [address] [place], [place] [address]   Urine Culture     Status: None   Collection Time: 06/23/20 10:08 PM   Specimen: Urine, Random  Result Value Ref Range Status   Specimen Description URINE, RANDOM  Final   Special Requests NONE  Final   Culture   Final    NO GROWTH Performed at [facility], [place] [address] [place], [place] [address]    Report Status 06/25/2020 FINAL  Final  Blood culture (routine x 2)     Status: None   Collection Time: 06/24/20  1:03 AM   Specimen: BLOOD LEFT WRIST  Result Value Ref Range Status   Specimen Description BLOOD LEFT WRIST  Final   Special Requests   Final    BOTTLES DRAWN AEROBIC ONLY Blood Culture results may not be optimal due to an inadequate volume of blood received in culture bottles   Culture   Final    NO GROWTH 5 DAYS Performed at [facility], [place] [address], [place], [place] [address]    Report Status 06/29/2020 FINAL  Final  MRSA PCR Screening     Status: None   Collection Time: 06/24/20 12:36 PM   Specimen: Nasal Mucosa; Nasopharyngeal  Result Value Ref Range Status   MRSA by PCR NEGATIVE NEGATIVE Final    Comment:        The GeneXpert MRSA Assay (FDA approved for NASAL specimens only), is one component of a comprehensive MRSA colonization surveillance program. It is not intended to diagnose MRSA infection nor to guide or monitor treatment for MRSA infections. Performed at [facility], [place] [address], [place], [place] [address]          Radiology Studies: DG Swallowing Func-Speech Pathology  Result Date: 06/28/2020 Objective Swallowing Evaluation: Type of Study: MBS-Modified Barium Swallow Study  Patient Details Name: Cynthia Stainback  MRN: [id number] Date of Birth: 10/07/1981 Today's Date: 06/28/2020 Time: SLP Start Time (ACUTE ONLY): 1002 -SLP Stop Time (ACUTE ONLY): 1016 SLP Time  Calculation (min) (ACUTE ONLY): 14 min Past Medical History: No past medical history on file. Past Surgical History: Past Surgical History: Procedure Laterality Date . CRANIOTOMY Left 09/09/2019  Procedure: DECOMPRESSIVE CRANIECTOMY AND  HEMATOMA EVACUATION, IMPLANTATION OF SKULL FLAP IN RIGHT ABDOMEN;  Surgeon: Erline Levine, MD;  Location: [facility];  Service: Neurosurgery;  Laterality: Left; . ESOPHAGOGASTRODUODENOSCOPY (EGD) WITH PROPOFOL N/A 09/28/2019  Procedure: ESOPHAGOGASTRODUODENOSCOPY (EGD) WITH PROPOFOL;  Surgeon: Georganna Skeans, MD;  Location: [facility] ENDOSCOPY;  Service: General;  Laterality: N/A; . PEG PLACEMENT N/A 09/28/2019  Procedure: PERCUTANEOUS ENDOSCOPIC GASTROSTOMY (PEG) PLACEMENT;  Surgeon: Georganna Skeans, MD;  Location: [facility] ENDOSCOPY;  Service: General;  Laterality: N/A; HPI: Pt is a 39 year old man admitted on 06/23/20 from his SNF after falling OOB with break through seizure. 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 Intubated 6/27-6/30. CXR Interim extubation and removal of NG tube. Right base infiltrate consistent with pneumonia. Aspiration cannot be excluded. 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 D/C to SNF in April 2021. Pt with residual aphasia, non verbal, with R hemiplegia. Familiar to [place]- had two bedside swallow assessments and treatments from 09/22/19 which he refused all po's, however on 12/12/20 he was acceting of water, Coke and crackers and was started on regular/thin liquids. He intermittently refused po's and ST discharged on 01/08/20.  No data recorded Assessment / Plan / Recommendation CHL IP CLINICAL IMPRESSIONS 06/28/2020 Clinical Impression Seger demonstrated minimal oral dysphagia and functional pharyngeal dysphagia despite recent ICH, 4 day intubation and history of ICH in 2020. Piecemeal transit of thin intermittently and minimal premature spill. Overall swallow was timely with one trial of thin reaching valleculae for 4-5 seconds before swallow initiated. Trace-minimal  residue in valleculae and pyriform sinuses cleairng with subequent swallows. Laryngeal vestibule was protected throughout each swallow thus preventing penetration and aspiration. He independently consumed small-medium size sips. Esophageal scan was unremarkable. A regular texture diet and thin liquids ordered, straws allowed, pills whole in puree and full supervision initially to ensure following precautions and continued ST.      SLP Visit Diagnosis Dysphagia, unspecified (R13.10) Attention and concentration deficit following -- Frontal lobe and executive function deficit following -- Impact on safety and function Mild aspiration risk;Moderate aspiration risk   CHL IP TREATMENT RECOMMENDATION 06/28/2020 Treatment Recommendations Defer until completion of intrumental exam   Prognosis 12/13/2019 Prognosis for Safe Diet Advancement Guarded Barriers to Reach Goals Behavior Barriers/Prognosis Comment -- CHL IP DIET RECOMMENDATION 01/03/2020 SLP Diet Recommendations -- Liquid Administration via -- Medication Administration -- Compensations Minimize environmental distractions Postural Changes --   CHL IP OTHER RECOMMENDATIONS 12/13/2019 Recommended Consults -- Oral Care Recommendations -- Other Recommendations Have oral suction available   CHL IP FOLLOW UP RECOMMENDATIONS 06/28/2020 Follow up Recommendations Skilled Nursing facility   [facility] IP FREQUENCY AND DURATION 12/13/2019 Speech Therapy Frequency (ACUTE ONLY) min 2x/week Treatment Duration 2 weeks      CHL IP ORAL PHASE 06/28/2020 Oral Phase Impaired Oral - Pudding Teaspoon -- Oral - Pudding Cup -- Oral - Honey Teaspoon -- Oral - Honey Cup -- Oral - Nectar Teaspoon -- Oral - Nectar Cup -- Oral - Nectar Straw -- Oral - Thin Teaspoon -- Oral - Thin Cup Piecemeal swallowing;Premature spillage Oral - Thin Straw WFL Oral - Puree -- Oral - Mech Soft -- Oral - Regular Delayed oral transit;Other (  Comment) Oral - Multi-Consistency -- Oral - Pill -- Oral Phase - Comment --  CHL IP  PHARYNGEAL PHASE 06/28/2020 Pharyngeal Phase Impaired Pharyngeal- Pudding Teaspoon -- Pharyngeal -- Pharyngeal- Pudding Cup -- Pharyngeal -- Pharyngeal- Honey Teaspoon -- Pharyngeal -- Pharyngeal- Honey Cup -- Pharyngeal -- Pharyngeal- Nectar Teaspoon -- Pharyngeal -- Pharyngeal- Nectar Cup -- Pharyngeal -- Pharyngeal- Nectar Straw -- Pharyngeal -- Pharyngeal- Thin Teaspoon -- Pharyngeal -- Pharyngeal- Thin Cup Pharyngeal residue - valleculae;Pharyngeal residue - pyriform Pharyngeal -- Pharyngeal- Thin Straw Pharyngeal residue - valleculae;Pharyngeal residue - pyriform;Delayed swallow initiation-vallecula Pharyngeal -- Pharyngeal- Puree -- Pharyngeal -- Pharyngeal- Mechanical Soft -- Pharyngeal -- Pharyngeal- Regular WFL Pharyngeal -- Pharyngeal- Multi-consistency -- Pharyngeal -- Pharyngeal- Pill -- Pharyngeal -- Pharyngeal Comment --  CHL IP CERVICAL ESOPHAGEAL PHASE 06/28/2020 Cervical Esophageal Phase WFL Pudding Teaspoon -- Pudding Cup -- Honey Teaspoon -- Honey Cup -- Nectar Teaspoon -- Nectar Cup -- Nectar Straw -- Thin Teaspoon -- Thin Cup -- Thin Straw -- Puree -- Mechanical Soft -- Regular -- Multi-consistency -- Pill -- Cervical Esophageal Comment -- [name] 06/28/2020, 10:52 AM [name].Ed Actor Pager [phone] Office [phone]                   Scheduled Meds: . amLODipine  10 mg Per Tube Daily  . baclofen  10 mg Per Tube QHS  . chlorhexidine  15 mL Mouth Rinse BID  . Chlorhexidine Gluconate Cloth  6 each Topical Daily  . cloNIDine  0.3 mg Transdermal Weekly  . hydrALAZINE  25 mg Oral Q8H  . levETIRAcetam  750 mg Per Tube BID  . mouth rinse  15 mL Mouth Rinse q12n4p  . metoprolol tartrate  25 mg Per Tube BID   Continuous Infusions: . sodium chloride Stopped (06/28/20 0932)  . sodium chloride 10 mL/hr at 06/25/20 0941     LOS: 7 days        Hosie Poisson, MD [facility]   To contact the attending provider between  7A-7P or the covering provider during after hours 7P-7A, please log into the web site [email or web address] and access using universal [place] password for that web site. If you do not have the password, please call the hospital operator.  06/30/2020, 8:51 AM

## 2020-06-30 NOTE — Plan of Care (Signed)
Found $20 in patient's bed, removed from patient bed and placed in belongings bag in cabinet.     Problem: Education: Goal: Knowledge of General Education information will improve Description: Including pain rating scale, medication(s)/side effects and non-pharmacologic comfort measures Outcome: Progressing   Problem: Activity: Goal: Risk for activity intolerance will decrease Outcome: Progressing   Problem: Nutrition: Goal: Adequate nutrition will be maintained Outcome: Progressing   Problem: Pain Managment: Goal: General experience of comfort will improve Outcome: Progressing   Problem: Safety: Goal: Ability to remain free from injury will improve Outcome: Progressing   Problem: Skin Integrity: Goal: Risk for impaired skin integrity will decrease Outcome: Progressing

## 2020-06-30 NOTE — Progress Notes (Signed)
   Palliative Medicine Inpatient Follow Up Note   I called patients mother, Joshua [name] this morning. I introduced Palliative Medicine as specialized medical care for people living with serious illness. It focuses on providing relief from the symptoms and stress of a serious illness. The goal is to improve quality of life for both the patient and the family.I shared with Joshua [name] that we had spoken a few months ago about Joshua [name] and his current health burdens. She stated that she has been clear on wanting anything and everything done for him. I asked Joshua [name] if she had the opportunity to see him recently and what her insights on his present state was? Unfortunately she appears to have hung the phone up. I called back multiple times though the phone rang through to voicemail. There is very little the Palliative care team can do to help this situation presently as the patients family seems unwilling to talk with [place].   Discussed with Dr. Karleen Hampshire  Time Spent: No Charge Greater than 50% of the time was spent in counseling and coordination of care ______________________________________________________________________________________ Startup Team Team Cell Phone: [phone] Please utilize secure chat with additional questions, if there is no response within 30 minutes please call the above phone number  Palliative Medicine Team providers are available by phone from 7am to 7pm daily and can be reached through the team cell phone.  Should this patient require assistance outside of these hours, please call the patient's attending physician.

## 2020-07-01 ENCOUNTER — Encounter (HOSPITAL_COMMUNITY): Payer: Self-pay | Admitting: Pulmonary Disease

## 2020-07-01 LAB — BASIC METABOLIC PANEL
Anion gap: 12 (ref 5–15)
BUN: 33 mg/dL — ABNORMAL HIGH (ref 6–20)
CO2: 22 mmol/L (ref 22–32)
Calcium: 9.6 mg/dL (ref 8.9–10.3)
Chloride: 105 mmol/L (ref 98–111)
Creatinine, Ser: 1.87 mg/dL — ABNORMAL HIGH (ref 0.61–1.24)
GFR calc Af Amer: 51 mL/min — ABNORMAL LOW (ref >60)
GFR calc non Af Amer: 44 mL/min — ABNORMAL LOW (ref >60)
Glucose, Bld: 117 mg/dL — ABNORMAL HIGH (ref 70–99)
Potassium: 3.8 mmol/L (ref 3.5–5.1)
Sodium: 139 mmol/L (ref 135–145)

## 2020-07-01 NOTE — Plan of Care (Signed)
  Problem: Education: Goal: Knowledge of General Education information will improve Description: Including pain rating scale, medication(s)/side effects and non-pharmacologic comfort measures Outcome: Progressing   Problem: Clinical Measurements: Goal: Ability to maintain clinical measurements within normal limits will improve Outcome: Progressing   Problem: Nutrition: Goal: Adequate nutrition will be maintained Outcome: Progressing   Problem: Elimination: Goal: Will not experience complications related to bowel motility Outcome: Progressing   Problem: Safety: Goal: Ability to remain free from injury will improve Outcome: Progressing

## 2020-07-01 NOTE — Progress Notes (Signed)
  Speech Language Pathology Treatment: Dysphagia  Patient Details Name: Joshua [name] MRN: [id number] DOB: Jan 26, 1981 Today's Date: 07/01/2020 Time: 1208-1219 SLP Time Calculation (min) (ACUTE ONLY): 11 min  Assessment / Plan / Recommendation Clinical Impression  F/u after Friday's MBS.  Pt currently on a regular diet with thin liquids.  During session today, he was seated in chair, verbalizing some one-word utterances that were not intelligible, nodded head yes/no to several questions, but responses were not consistent.  He fed himself crackers and thin water from a straw with adequate oral attention, effective mastication, and no s/s of aspiration, supporting results from MBS.  Recommend continuing current diet; swallowing is stable, there are no further needs from acute care SLP services - we will sign off.   HPI HPI: Pt is a 39 year old man admitted on 06/23/20 from his SNF after falling OOB with break through seizure. 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 Intubated 6/27-6/30. CXR Interim extubation and removal of NG tube. Right base infiltrate consistent with pneumonia. Aspiration cannot be excluded. 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 D/C to SNF in April 2021. Pt with residual aphasia, non verbal, with R hemiplegia. Familiar to [place]- had two bedside swallow assessments and treatments from 09/22/19 which he refused all po's, however on 12/13/19 he was acceting of water, Coke and crackers and was started on regular/thin liquids. He intermittently refused po's and ST discharged on 01/08/20.      SLP Plan  All goals met       Recommendations  Diet recommendations: Regular;Thin liquid Liquids provided via: Cup;Straw Medication Administration: Whole meds with puree Supervision: Intermittent supervision to cue for compensatory strategies Compensations: Minimize environmental distractions Postural Changes and/or Swallow Maneuvers: Out of bed for meals                Oral Care  Recommendations: Oral care BID Follow up Recommendations: Skilled Nursing facility Plan: All goals met       GO              Joshua [name], [place] CCC/SLP Acute Rehabilitation Services Office number [phone] Pager [phone]   Joshua [name] 07/01/2020, 12:22 PM

## 2020-07-01 NOTE — Progress Notes (Signed)
PROGRESS NOTE    Joshua [name]  FOY:[id number] DOB: January 19, 1981 DOA: 06/23/2020 PCP: Patient, [name]   Chief Complaint  Patient presents with  . 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  On arrival to ED was intubated for airway protection.  CT of the head shows new left frontal ICH.  He had an episode of tachycardia in the ED which was treated with adenosine.  He was extubated on 06/26/2020.  Neurology consulted and recommendations given.  Neurosurgery on board.  Patient was transferred from [facility] service to [facility] on 06/28/2020.  Patient seen and examined at bedside he is currently waiting for cranioplasty to be scheduled later this week Reported by RN that his pressures have been borderline, we have discontinued Norvasc and hydralazine.   Assessment & Plan:   Active Problems:   Intracranial hemorrhage (HCC)   Seizure (HCC)   Acute left frontal intracranial hemorrhage and breakthrough seizures with subsequent acute respiratory failure in the setting of prior left hemispheric intracranial hemorrhage.  History of craniotomy and decompression craniectomy.   Patient was initially loaded with Keppra in the ED and evaluated by neurology.  Transitioned to oral Keppra.  Continuous EEG completed which does not show any overt seizures but was found to have epileptiform focus in the left frontal region.  Neurosurgery on board and possible consideration for bone flap replacement/ cranioplasty next week.  Continue to monitor his mental status Patient appears to be at baseline at this time. He is non verbal, but is able to track and feed himself.   Acute respiratory failure  with hypoxia secondary to seizures s/p intubation and extubated on 06/26/2020. Continue with pulmonary hygiene.  Patient is on room air with good sats.   Possible aspiration event Chest x-ray on 6/30 does not show any infiltrate.  Completed 3 days of Unasyn. Currently afebrile, no leukocytosis, does not appear to have shortness of breath    Hypertensive urgency. Blood pressure parameters are borderline normal, DC Norvasc and hydralazine.  Continue with metoprolol 25 mg twice daily   Hypokalemia Replaced   Lactic acidosis.  Resolved.  Probably from seizures.    AKI;  probably from dehydration. Improving.  Creatinine on admission is 2.62, improved to 1.4 but slight worsening to 1.8 this morning probably from borderline blood pressures.  Encourage oral intake.   Leukocytosis probably reactive - resolved.    DVT prophylaxis: scd's Code Status: FULL CODE.  Family Communication: None at bedside.  Disposition:   Status is: Inpatient  Remains inpatient appropriate because:Ongoing diagnostic testing needed not appropriate for outpatient work up   Dispo: The patient is from: SNF              Anticipated d/c is to: SNF              Anticipated d/c date is: > 3 days              Patient currently is not medically stable to d/c.       Consultants:   Neurosurgery  PCCM   Procedures: none.    Antimicrobials: none.    Subjective: He is nonverbal but able to track and feed himself, no distress noted  Objective: Vitals:   06/30/20 1404  06/30/20 2037 07/01/20 0630 07/01/20 0845  BP: 115/87 118/83 108/82 110/89  Pulse: (!) 103 (!) 106 99 98  Resp: 16  16 16   Temp: 98 F (36.7 C) 97.7 F (36.5 C) 97.7 F (36.5 C) 97.6 F (36.4 C)  TempSrc: Oral Oral Oral Oral  SpO2: 97% 95% 95% 97%  Weight:      Height:        Intake/Output Summary (Last 24 hours) at 07/01/2020 1010 Last data filed at 07/01/2020 0900 Gross per 24 hour  Intake 360 ml  Output 450 ml  Net  -90 ml   Filed Weights   06/27/20 0336 06/28/20 0500 06/29/20 0500  Weight: 60.3 kg 58.4 kg 57.3 kg    Examination:  General exam: Alert and comfortable no distress noted Respiratory system: Clear to auscultation bilaterally, no wheezing or rhonchi, no tachypnea Cardiovascular system: S1-S2 heard, tachycardic, no JVD, no pedal edema Gastrointestinal system: Abdomen is soft, nontender, nondistended, bowel sounds normal, G-tube in place Central nervous system: Patient is alert but nonverbal, able to track people and able to feed himself. Extremities: No pedal edema, cyanosis or clubbing Skin: No rashes seen Psychiatry: Cannot be assessed   Data Reviewed: I have personally reviewed following labs and imaging studies  CBC: Recent Labs  Lab 06/25/20 0316 06/26/20 0303 06/27/20 0250 06/29/20 0500  WBC 13.7* 11.9* 9.3 7.8  HGB 12.8* 10.5* 12.0* 13.0  HCT 39.8 33.6* 37.9* 40.4  MCV 79.1* 80.0 79.0* 79.1*  PLT 223 177 209 850    Basic Metabolic Panel: Recent Labs  Lab 06/25/20 0316 06/25/20 0316 06/26/20 0303 06/27/20 0250 06/28/20 0620 06/29/20 0500 07/01/20 0910  NA 140   < > 143 144 141 138 139  K 3.9   < > 3.1* 3.8 3.5 3.9 3.8  CL 108   < > 112* 112* 107 104 105  CO2 21*   < > 20* 24 22 25 22   GLUCOSE 99   < > 114* 136* 124* 106* 117*  BUN 20   < > 21* 16 18 21* 33*  CREATININE 2.62*   < > 2.51* 1.72* 1.48* 1.46* 1.87*  CALCIUM 8.8*   < > 8.5* 9.1 9.2 9.7 9.6  MG  --   --  2.1 2.1 1.7  --   --   PHOS 3.6  --   --  3.4 4.5  --   --    < > = values in this interval not displayed.    GFR: Estimated Creatinine Clearance: 42.6 mL/min (A) (by C-G formula based on SCr of 1.87 mg/dL (H)).  Liver Function Tests: Recent Labs  Lab 06/25/20 0316  ALBUMIN 2.6*    CBG: Recent Labs  Lab 06/28/20 1950 06/29/20 0106 06/29/20 0400 06/29/20 0746 06/29/20 1134  GLUCAP 113* 87 99 123* 116*     Recent Results (from the past 240 hour(s))  Blood culture (routine x  2)     Status: None   Collection Time: 06/23/20  9:10 PM   Specimen: BLOOD  Result Value Ref Range Status   Specimen Description BLOOD LEFT ANTECUBITAL  Final   Special Requests   Final    BOTTLES DRAWN AEROBIC AND ANAEROBIC Blood Culture results may not be optimal due to an inadequate volume of blood received in culture bottles   Culture   Final    NO GROWTH 5 DAYS Performed at [facility], [place] [address] [place], [place] [address]    Report Status 06/28/2020 FINAL  Final  SARS Coronavirus 2 by RT PCR (hospital order, performed in [facility] hospital lab) Nasopharyngeal Nasopharyngeal Swab     Status: None   Collection Time: 06/23/20  9:15 PM   Specimen: Nasopharyngeal Swab  Result Value Ref Range Status   SARS Coronavirus 2 NEGATIVE NEGATIVE Final    Comment: (NOTE) SARS-CoV-2 target nucleic acids are NOT DETECTED.  The SARS-CoV-2 RNA is generally detectable in upper and lower respiratory specimens during the acute phase of infection. The lowest concentration of SARS-CoV-2 viral copies this assay can detect is 250 copies / mL. A negative result does not preclude SARS-CoV-2 infection and should not be used as the sole basis for treatment or other patient management decisions.  A negative result may occur with improper specimen collection / handling, submission of specimen other than nasopharyngeal swab, presence of viral mutation(s) within the areas targeted by this assay, and inadequate number of viral copies (<250 copies / mL). A negative result must be combined with clinical observations, patient history, and epidemiological information.  Fact Sheet for Patients:   StrictlyIdeas.no  Fact Sheet for Healthcare Providers: BankingDealers.co.za  This test is not yet approved or  cleared by the [place] FDA and has been authorized for detection and/or diagnosis of SARS-CoV-2 by FDA under an Emergency Use Authorization  (EUA).  This EUA will remain in effect (meaning this test can be used) for the duration of the COVID-19 declaration under Section 564(b)(1) of the Act, 21 U.S.C. section 360bbb-3(b)(1), unless the authorization is terminated or revoked sooner.  Performed at [facility], [place] [address] [place], [place] [address]   Urine Culture     Status: None   Collection Time: 06/23/20 10:08 PM   Specimen: Urine, Random  Result Value Ref Range Status   Specimen Description URINE, RANDOM  Final   Special Requests NONE  Final   Culture   Final    NO GROWTH Performed at [facility], [place] [address] [place], [place] [address]    Report Status 06/25/2020 FINAL  Final  Blood culture (routine x 2)     Status: None   Collection Time: 06/24/20  1:03 AM   Specimen: BLOOD LEFT WRIST  Result Value Ref Range Status   Specimen Description BLOOD LEFT WRIST  Final   Special Requests   Final    BOTTLES DRAWN AEROBIC ONLY Blood Culture results may not be optimal due to an inadequate volume of blood received in culture bottles   Culture   Final    NO GROWTH 5 DAYS Performed at [facility], [place] [address] [place], [place] [address]    Report Status 06/29/2020 FINAL  Final  MRSA PCR Screening     Status: None   Collection Time: 06/24/20 12:36 PM   Specimen: Nasal Mucosa; Nasopharyngeal  Result Value Ref Range Status   MRSA by PCR NEGATIVE NEGATIVE Final    Comment:        The GeneXpert MRSA Assay (FDA approved for NASAL specimens only), is one component of a comprehensive MRSA colonization surveillance program. It is not intended to diagnose MRSA infection nor to guide or monitor treatment for MRSA infections. Performed at [facility], [place] [address] [place], [place] [address]          Radiology Studies: No results found.      Scheduled Meds: . baclofen  10 mg Per Tube QHS  . chlorhexidine  15 mL Mouth Rinse BID  .  Chlorhexidine Gluconate Cloth  6 each  Topical Daily  . cloNIDine  0.3 mg Transdermal Weekly  . levETIRAcetam  750 mg Per Tube BID  . mouth rinse  15 mL Mouth Rinse q12n4p  . metoprolol tartrate  25 mg Per Tube BID   Continuous Infusions:    LOS: 8 days        Hosie Poisson, MD [facility]   To contact the attending provider between 7A-7P or the covering provider during after hours 7P-7A, please log into the web site [email or web address] and access using universal [place] password for that web site. If you do not have the password, please call the hospital operator.  07/01/2020, 10:10 AM

## 2020-07-01 NOTE — Progress Notes (Signed)
Pt 48 mom has been updated with pt current status/condition. All questions and concerns were answered satisfactorily.

## 2020-07-01 NOTE — Progress Notes (Signed)
Subjective: NAEs o/n  Objective: Vital signs in last 24 hours: Temp:  [97.6 F (36.4 C)-98 F (36.7 C)] 97.6 F (36.4 C) (07/05 0845) Pulse Rate:  [98-106] 98 (07/05 0845) Resp:  [16] 16 (07/05 0845) BP: (108-118)/(82-89) 110/89 (07/05 0845) SpO2:  [95 %-97 %] 97 % (07/05 0845)  Intake/Output from previous day: 07/04 0701 - 07/05 0700 In: 360 [P.O.:360] Out: 850 [Urine:850] Intake/Output this shift: Total I/O In: 120 [P.O.:120] Out: -   Awake, alert.  Eyes open spont.  Global apasia but purposeful in right arm and leg. Flap soft  Lab Results: Recent Labs    06/29/20 0500  WBC 7.8  HGB 13.0  HCT 40.4  PLT 303   BMET Recent Labs    06/29/20 0500 07/01/20 0910  NA 138 139  K 3.9 3.8  CL 104 105  CO2 25 22  GLUCOSE 106* 117*  BUN 21* 33*  CREATININE 1.46* 1.87*  CALCIUM 9.7 9.6    Studies/Results: No results found.  Assessment/Plan: Cranial defect - awaiting cranioplasty with Dr. Amanda Cockayne 07/01/2020, 10:29 AM

## 2020-07-01 NOTE — Plan of Care (Signed)
°  Problem: Clinical Measurements: Goal: Ability to maintain clinical measurements within normal limits will improve Outcome: Progressing   Problem: Nutrition: Goal: Adequate nutrition will be maintained Outcome: Progressing   Problem: Safety: Goal: Ability to remain free from injury will improve Outcome: Progressing   Problem: Skin Integrity: Goal: Risk for impaired skin integrity will decrease Outcome: Progressing

## 2020-07-01 NOTE — Progress Notes (Signed)
Physical Therapy Treatment Patient Details Name: Joshua [name] MRN: [id number] DOB: 1981/05/03 Today's Date: 07/01/2020    History of Present Illness Pt is a 39 year old man admitted on 06/23/20 from his SNF after falling OOB with break through seizure. 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 D/C to SNF in April 2021. Pt with residual aphasia, non verbal, with R hemiplegia.    PT Comments    Pt sidelying to the R in bed.  PTA assisted in rolling and elevating patient to a seated position.  Pt very pleasant and participatory this session.  He was able to follow commands to reach for recliner and performed squat pivot from bed to chair.  Continue to recommend SNF placement at d/c.      Follow Up Recommendations  SNF;Supervision/Assistance - 24 hour     Equipment Recommendations  Other (comment) (TBA)    Recommendations for Other Services       Precautions / Restrictions Precautions Precautions: Fall Precaution Comments: PEG, incontinent of bowel Restrictions Weight Bearing Restrictions: No    Mobility  Bed Mobility Overal bed mobility: Needs Assistance Bed Mobility: Rolling;Sidelying to Sit;Supine to Sit Rolling: Max assist Sidelying to sit: Mod assist       General bed mobility comments: Pt in R sidelying required rolling to L side of bed and assistance to elevate trunk into a seated position.  Once in sitting able to maintain balance unassisted with L UE support.  Transfers Overall transfer level: Needs assistance Equipment used: None Transfers: Squat Pivot Transfers Sit to Stand: Mod assist;+2 physical assistance         General transfer comment: Mod assistance to boost into standing and move hips from surface to surface.  Pt reaching for L arm rests to pull himself into his recliner.  Ambulation/Gait                 Stairs             Wheelchair Mobility    Modified Rankin (Stroke Patients Only)        Balance Overall balance assessment: Needs assistance Sitting-balance support: Feet supported Sitting balance-Leahy Scale: Fair Sitting balance - Comments: LOB posterior until able to support himself with LUE.                                    Cognition Arousal/Alertness: Awake/alert Behavior During Therapy: WFL for tasks assessed/performed Overall Cognitive Status: History of cognitive impairments - at baseline (Pt cognition is much improved from last admission and he did follow commands to participate in session this am.)                                        Exercises      General Comments        Pertinent Vitals/Pain Pain Assessment: Faces Faces Pain Scale: No hurt    Home Living                      Prior Function            PT Goals (current goals can now be found in the care plan section) Acute Rehab PT Goals Patient Stated Goal: not stated Potential to Achieve Goals: Fair Progress towards PT goals: Progressing  toward goals    Frequency    Min 2X/week      PT Plan Current plan remains appropriate    Co-evaluation              AM-PAC PT "6 Clicks" Mobility   Outcome Measure  Help needed turning from your back to your side while in a flat bed without using bedrails?: A Lot Help needed moving from lying on your back to sitting on the side of a flat bed without using bedrails?: A Lot Help needed moving to and from a bed to a chair (including a wheelchair)?: A Lot Help needed standing up from a chair using your arms (e.g., wheelchair or bedside chair)?: A Lot Help needed to walk in hospital room?: Total Help needed climbing 3-5 steps with a railing? : Total 6 Click Score: 10    End of Session Equipment Utilized During Treatment: Gait belt Activity Tolerance: Patient tolerated treatment well;Patient limited by fatigue Patient left: with call bell/phone within reach;in chair;with chair alarm set  (sitter alarm belt) Nurse Communication: Mobility status PT Visit Diagnosis: Other abnormalities of gait and mobility (R26.89);Muscle weakness (generalized) (M62.81);Other symptoms and signs involving the nervous system (R29.898);Hemiplegia and hemiparesis Hemiplegia - Right/Left: Right Hemiplegia - caused by: Nontraumatic intracerebral hemorrhage     Time: 1120-1135 PT Time Calculation (min) (ACUTE ONLY): 15 min  Charges:  $Therapeutic Activity: 8-22 mins                     Joshua [name] , PTA Acute Rehabilitation Services Pager [phone] Office [phone]     Joshua [name] 07/01/2020, 12:21 PM

## 2020-07-01 NOTE — Progress Notes (Signed)
Late Entry MBS    06/28/20 1021  SLP Visit Information  SLP Received On 06/28/20  Pain Assessment  Pain Assessment Faces  Faces Pain Scale 0  General Information  HPI Pt is a 39 year old man admitted on 06/23/20 from his SNF after falling OOB with break through seizure. 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 Intubated 6/27-6/30. CXR Interim extubation and removal of NG tube. Right base infiltrate consistent with pneumonia. Aspiration cannot be excluded. 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 D/C to SNF in April 2021. Pt with residual aphasia, non verbal, with R hemiplegia. Familiar to [place]- had two bedside swallow assessments and treatments from 09/22/19 which he refused all po's, however on 12/12/20 he was acceting of water, Coke and crackers and was started on regular/thin liquids. He intermittently refused po's and ST discharged on 01/08/20.  Type of Study MBS-Modified Barium Swallow Study  Previous Swallow Assessment see HPI  Diet Prior to this Study NPO;PEG tube  Temperature Spikes Noted No  Respiratory Status Room air  History of Recent Intubation Yes  Length of Intubations (days) 4 days  Date extubated 06/26/20  Behavior/Cognition Alert;Cooperative;Pleasant mood;Requires cueing  Oral Care Completed by SLP Recent completion by patient/staff  Oral Cavity - Dentition Adequate natural dentition  Vision Functional for self feeding  Self-Feeding Abilities Able to feed self  Patient Positioning Upright in chair  Baseline Vocal Quality  (low during vocalization)  Volitional Cough  (unable)  Volitional Swallow Unable to elicit  Anatomy [facility]  Pharyngeal Secretions Not observed secondary MBS  Oral Assessment (Complete on admission/transfer/change in patient condition)  Does patient have any of the following "high(er) risk" factors? Nutritional status - fluids only or NPO for >24 hours;Diet - patient on tube feedings  Patient is HIGH RISK: Non-ventilated Order set for Adult Oral  Care Protocol initiated - "High Risk Patients - Non-Ventilated" option selected  (see row information)  Oral Motor/Sensory Function  Overall Oral Motor/Sensory Function  (was unable due to apraxia- no overt focal weakness)  Oral Preparation/Oral Phase  Oral Phase Impaired  Oral - Thin  Oral - Thin Cup Piecemeal swallowing;Premature spillage  Oral - Thin Straw WFL  Oral - Solids  Oral - Regular Delayed oral transit;Other (Comment) (mildly prolonged mastication)  Pharyngeal Phase  Pharyngeal Phase Impaired  Pharyngeal - Thin  Pharyngeal- Thin Cup Pharyngeal residue - valleculae;Pharyngeal residue - pyriform  Pharyngeal- Thin Straw Pharyngeal residue - valleculae;Pharyngeal residue - pyriform;Delayed swallow initiation-vallecula  Pharyngeal - Solids  Pharyngeal- Regular WFL  Cervical Esophageal Phase  Cervical Esophageal Phase [facility]  Clinical Impression  Clinical Impression Joshua [name] demonstrated minimal oral dysphagia and functional pharyngeal dysphagia despite recent ICH, 4 day intubation and history of ICH in 2020. Piecemeal transit of thin intermittently and minimal premature spill. Overall swallow was timely with one trial of thin reaching valleculae for 4-5 seconds before swallow initiated. Trace-minimal residue in valleculae and pyriform sinuses cleairng with subequent swallows. Laryngeal vestibule was protected throughout each swallow thus preventing penetration and aspiration. He independently consumed small-medium size sips. Esophageal scan was unremarkable. A regular texture diet and thin liquids ordered, straws allowed, pills whole in puree and full supervision initially to ensure following precautions and continued ST.       SLP Time Calculation  SLP Start Time (ACUTE ONLY) 1002  SLP Stop Time (ACUTE ONLY) 1016  SLP Time Calculation (min) (ACUTE ONLY) 14 min  Joshua [name] [place].Ed Risk analyst [phone] Office [phone]

## 2020-07-02 DIAGNOSIS — I10 Essential (primary) hypertension: Secondary | ICD-10-CM

## 2020-07-02 MED ORDER — ENSURE ENLIVE PO LIQD
237.0000 mL | Freq: Three times a day (TID) | ORAL | Status: DC
  Administered 2020-07-03 – 2020-07-05 (×5): 237 mL via ORAL

## 2020-07-02 MED ORDER — ADULT MULTIVITAMIN W/MINERALS CH
1.0000 | ORAL_TABLET | Freq: Every day | ORAL | Status: DC
  Administered 2020-07-02 – 2020-07-03 (×2): 1 via ORAL
  Filled 2020-07-02 (×4): qty 1

## 2020-07-02 NOTE — Progress Notes (Addendum)
Myself and my nurse practitioner have attempted to contact the mother multiple times during this admission to discuss the goals of care for the patient. Despite our attempts, we have not been able to speak with her. A palliative care consult was made in an attempt to aide in determining goals of care and what the family would like to do moving forward. Palliative care briefly spoke to the mother before being hung up on. It was stated that the mother wants everything done for the patient. Looking at the patient's baseline status, and the complications and extended hospital stay during his last admission, I do not believe that the benefits from having a cranioplasty would outweigh the risks involved with the surgery. Doing "everything" for the patient does not mean doing  surgeries that would have minimal benefit and put the patient at risk for potential complications. The mother is unavailable at this time to discuss the patient's care, nor discuss the risks and benefits involved with a cranioplasty. Therefore, after doing a risk-benefit analysis I do not feel that putting the patient through a cranioplasty is the correct thing to do as this could put the patient at risk for potentially life threatening complications. As the patient is bed-bound and immobile, I do not think the risk of continuing without a cranioplasty is that high. It seems to me that the risks of surgery outweigh potential benefits to the patient.  I reviewed the situation with Dr. Andria Frames, [place], who agreed with my recommendations and said he would leave supporting documentation to that effect.

## 2020-07-02 NOTE — Progress Notes (Signed)
PROGRESS NOTE    Joshua [name]  EHM:[id number] DOB: 1981/04/04 DOA: 06/23/2020 PCP: Patient, [name]   Chief Complaint  Patient presents with  . 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  On arrival to ED was intubated for airway protection.  CT of the head shows new left frontal ICH.  He had an episode of tachycardia in the ED which was treated with adenosine.  He was extubated on 06/26/2020.  Neurology consulted and recommendations given.  Neurosurgery on board.  Patient was transferred from [facility] service to [facility] on 06/28/2020. Initial plan was for a cranioplasty later this week, but keeping in mind patient's baseline status, complications, Neurosurgery suggests and does not believe that benefits outweigh the risks.  Patient seen and examined at bedside, no change in mental status. No over night events    Assessment & Plan:   Active Problems:   Intracranial hemorrhage (HCC)   Seizure (HCC)   Acute left frontal intracranial hemorrhage and breakthrough seizures with subsequent acute respiratory failure in the setting of prior left hemispheric intracranial hemorrhage.  History of craniotomy and decompression craniectomy.   Patient was initially loaded with Keppra in the ED and evaluated by neurology.  Transitioned to oral Keppra.  Continuous EEG completed which does not show any overt seizures but was found to have epileptiform focus in the left frontal region.  Neurosurgery on board, suggested that proceeding with cranioplasty would not benefit him due to high risk of complications. Continue to monitor his mental status Patient appears to be at baseline at this  time. He is non verbal, but is able to track and feed himself.   Acute respiratory failure with hypoxia secondary to seizures s/p intubation and extubated on 06/26/2020. Continue with pulmonary hygiene.  Patient is on room air with good sats. No sob or chest pain.    Possible aspiration event Chest x-ray on 6/30 does not show any infiltrate.  Completed 3 days of Unasyn. Currently afebrile, no leukocytosis, .     Hypertensive urgency. Blood pressure parameters are borderline normal, DC Norvasc and hydralazine.  Continue with metoprolol 25 mg twice daily   Hypokalemia Replaced   Lactic acidosis.  Resolved.  Probably from seizures.    AKI;  probably from dehydration. Improving.  Creatinine on admission is 2.62, improved to 1.4 but slight worsening to 1.8 this morning probably from borderline blood pressures. Repeat BMP today.   Encourage oral intake.   Leukocytosis probably reactive - resolved.     Palliative care consulted initially but unfortunately patient's mother has hung up during the phone conversation.   DVT prophylaxis: scd's Code Status: FULL CODE.  Family Communication: None at bedside. Called mother, unable to reach her. Disposition:   Status is: Inpatient  Remains inpatient appropriate because:Unsafe d/c plan   Dispo: The patient is from: SNF              Anticipated d/c is to: SNF              Anticipated d/c date is: > 3 days              Patient currently is medically stable to d/c.       Consultants:   Neurosurgery  PCCM   Procedures: none.    Antimicrobials: none.    Subjective: Nonverbal, appears comfortable. Not in distress.   Objective: Vitals:   07/01/20 1955 07/02/20 0443 07/02/20 0500 07/02/20 0929  BP: 121/88 119/78  (!) 124/99  Pulse: 86 82  77  Resp: 18 18  17   Temp: 97.7 F (36.5 C) 97.6 F (36.4 C)  (!) 97.5 F (36.4 C)  TempSrc: Oral Oral  Oral  SpO2: 99% 98%  98%  Weight:   65.8 kg   Height:         Intake/Output Summary (Last 24 hours) at 07/02/2020 1232 Last data filed at 07/02/2020 0857 Gross per 24 hour  Intake 240 ml  Output 900 ml  Net -660 ml   Filed Weights   06/28/20 0500 06/29/20 0500 07/02/20 0500  Weight: 58.4 kg 57.3 kg 65.8 kg    Examination:  General exam: Alert, appears comfortable no interest distracted Respiratory system: Clear to auscultation bilaterally, no wheezing or rhonchi no tachypnea Cardiovascular system: S1-S2 heard, regular rate rhythm, no JVD, no pedal edema Gastrointestinal system: Abdomen is soft, nontender, nondistended, bowel sounds normal, G-tube in place Central nervous system: Patient is alert, nonverbal but able to track people and feed himself Extremities: No pedal edema Skin: No rashes seen Psychiatry: Cannot be assessed   Data Reviewed: I have personally reviewed following labs and imaging studies  CBC: Recent Labs  Lab 06/26/20 0303 06/27/20 0250 06/29/20 0500  WBC 11.9* 9.3 7.8  HGB 10.5* 12.0* 13.0  HCT 33.6* 37.9* 40.4  MCV 80.0 79.0* 79.1*  PLT 177 209 323    Basic Metabolic Panel: Recent Labs  Lab 06/26/20 0303 06/27/20 0250 06/28/20 0620 06/29/20 0500 07/01/20 0910  NA 143 144 141 138 139  K 3.1* 3.8 3.5 3.9 3.8  CL 112* 112* 107 104 105  CO2 20* 24 22 25 22   GLUCOSE 114* 136* 124* 106* 117*  BUN 21* 16 18 21* 33*  CREATININE 2.51* 1.72* 1.48* 1.46* 1.87*  CALCIUM 8.5* 9.1 9.2 9.7 9.6  MG 2.1 2.1 1.7  --   --   PHOS  --  3.4 4.5  --   --     GFR: Estimated Creatinine Clearance: 48.9 mL/min (A) (by C-G formula based on SCr of 1.87 mg/dL (H)).  Liver Function Tests: No results for input(s): AST, ALT, ALKPHOS, BILITOT, PROT, ALBUMIN in the last 168 hours.  CBG: Recent Labs  Lab 06/28/20 1950 06/29/20 0106 06/29/20 0400 06/29/20 0746 06/29/20 1134  GLUCAP 113* 87 99 123* 116*     Recent Results (from the past 240 hour(s))  Blood culture (routine x 2)     Status: None   Collection Time:  06/23/20  9:10 PM   Specimen: BLOOD  Result Value Ref Range Status   Specimen Description BLOOD LEFT ANTECUBITAL  Final   Special Requests   Final    BOTTLES DRAWN AEROBIC AND ANAEROBIC Blood Culture results may not be optimal due to an inadequate volume of blood received in culture bottles   Culture   Final    NO GROWTH 5 DAYS Performed at [facility], [place] [address], [place], [place] [address]    Report Status 06/28/2020 FINAL  Final  SARS Coronavirus 2 by RT PCR (hospital order, performed in [facility] hospital lab) Nasopharyngeal Nasopharyngeal Swab     Status: None   Collection Time: 06/23/20  9:15 PM   Specimen: Nasopharyngeal Swab  Result Value Ref Range Status  SARS Coronavirus 2 NEGATIVE NEGATIVE Final    Comment: (NOTE) SARS-CoV-2 target nucleic acids are NOT DETECTED.  The SARS-CoV-2 RNA is generally detectable in upper and lower respiratory specimens during the acute phase of infection. The lowest concentration of SARS-CoV-2 viral copies this assay can detect is 250 copies / mL. A negative result does not preclude SARS-CoV-2 infection and should not be used as the sole basis for treatment or other patient management decisions.  A negative result may occur with improper specimen collection / handling, submission of specimen other than nasopharyngeal swab, presence of viral mutation(s) within the areas targeted by this assay, and inadequate number of viral copies (<250 copies / mL). A negative result must be combined with clinical observations, patient history, and epidemiological information.  Fact Sheet for Patients:   StrictlyIdeas.no  Fact Sheet for Healthcare Providers: BankingDealers.co.za  This test is not yet approved or  cleared by the [place] FDA and has been authorized for detection and/or diagnosis of SARS-CoV-2 by FDA under an Emergency Use Authorization (EUA).  This EUA will remain in effect  (meaning this test can be used) for the duration of the COVID-19 declaration under Section 564(b)(1) of the Act, 21 U.S.C. section 360bbb-3(b)(1), unless the authorization is terminated or revoked sooner.  Performed at [facility], [place] [address] [place], [place] [address]   Urine Culture     Status: None   Collection Time: 06/23/20 10:08 PM   Specimen: Urine, Random  Result Value Ref Range Status   Specimen Description URINE, RANDOM  Final   Special Requests NONE  Final   Culture   Final    NO GROWTH Performed at [facility], [place] [address] [place], [place] [address]    Report Status 06/25/2020 FINAL  Final  Blood culture (routine x 2)     Status: None   Collection Time: 06/24/20  1:03 AM   Specimen: BLOOD LEFT WRIST  Result Value Ref Range Status   Specimen Description BLOOD LEFT WRIST  Final   Special Requests   Final    BOTTLES DRAWN AEROBIC ONLY Blood Culture results may not be optimal due to an inadequate volume of blood received in culture bottles   Culture   Final    NO GROWTH 5 DAYS Performed at [facility], [place] [address], [place], [place] [address]    Report Status 06/29/2020 FINAL  Final  MRSA PCR Screening     Status: None   Collection Time: 06/24/20 12:36 PM   Specimen: Nasal Mucosa; Nasopharyngeal  Result Value Ref Range Status   MRSA by PCR NEGATIVE NEGATIVE Final    Comment:        The GeneXpert MRSA Assay (FDA approved for NASAL specimens only), is one component of a comprehensive MRSA colonization surveillance program. It is not intended to diagnose MRSA infection nor to guide or monitor treatment for MRSA infections. Performed at [facility], [place] [address], [place],  [address]          Radiology Studies: No results found.      Scheduled Meds: . baclofen  10 mg Per Tube QHS  . chlorhexidine  15 mL Mouth Rinse BID  . Chlorhexidine Gluconate Cloth  6 each Topical Daily  . cloNIDine  0.3 mg  Transdermal Weekly  . levETIRAcetam  750 mg Per Tube BID  . mouth rinse  15 mL Mouth Rinse q12n4p  . metoprolol tartrate  25 mg Per Tube BID  Continuous Infusions:    LOS: 9 days        Hosie Poisson, MD [facility]   To contact the attending provider between 7A-7P or the covering provider during after hours 7P-7A, please log into the web site [email or web address] and access using universal [place] password for that web site. If you do not have the password, please call the hospital operator.  07/02/2020, 12:32 PM

## 2020-07-02 NOTE — Progress Notes (Signed)
Nutrition Follow-up  DOCUMENTATION CODES:   Not applicable  INTERVENTION:   -Ensure Enlive po TID, each supplement provides 350 kcal and 20 grams of protein -MVI with minerals daily -Magic cup TID with meals, each supplement provides 290 kcal and 9 grams of protein  NUTRITION DIAGNOSIS:   Inadequate oral intake related to inability to eat as evidenced by NPO status.  Progressing; advanced to PO diet on 06/28/20  GOAL:   Patient will meet greater than or equal to 90% of their needs  Progressing   MONITOR:   PO intake, Supplement acceptance, Labs, Weight trends, Skin, I & O's  REASON FOR ASSESSMENT:   Consult Enteral/tube feeding initiation and management  ASSESSMENT:   39 yo male admitted with seizure activity and unwitnessed fall. Found to have new L frontal ICH. PMH includes ICH (08/2019), seizures, PEG.  7/2- TF d/c, s/p MBSS- advanced to regular diet with thin liquids  Reviewed I/O's: -660 ml x 24 hours and -666 ml since admission  UOP: 900 ml x 24 hours   Pt lying in bed at time of visit. He was not responsive to this RD.   Pt consuming a regular diet. Noted meal completion 25-50%. Per previous RD notes, pt was consuming a PO diet and not using his PEG PTA. Given decreased oral intake, pt would greatly benefit from addition of oral nutrition supplements.   Labs reviewed: CBGS: 99-123.   Diet Order:   Diet Order            Diet regular Room service appropriate? Yes with Assist; Fluid consistency: Thin  Diet effective now                 EDUCATION NEEDS:   No education needs have been identified at this time  Skin:  Skin Assessment: Skin Integrity Issues: Skin Integrity Issues:: Unstageable Unstageable: R heel  Last BM:  06/27/20  Height:   Ht Readings from Last 1 Encounters:  06/24/20 5\' 8"  (1.727 m)    Weight:   Wt Readings from Last 1 Encounters:  07/02/20 65.8 kg    Ideal Body Weight:  70 kg  BMI:  Body mass index is 22.06  kg/m.  Estimated Nutritional Needs:   Kcal:  2000-2200  Protein:  90-110 gm  Fluid:  >/= 2 L    Loistine Chance, RD, LDN, [place] Registered Dietitian II Certified Diabetes Care and Education Specialist Please refer to [facility] for RD and/or RD on-call/weekend/after hours pager

## 2020-07-02 NOTE — Ethics Note (Signed)
Asked question by Dr. Terrance Mass: Should this patient have cranioplasty? Since ethics doesn't make medical recommendations, our input is mostly on process: I.e. how to come to a best answer for that question.  Caveat: I am unable to speak to patient and did not attempt to contact the mother myself.  I am answering this question based on the information provided by Dr. Vertell Limber.  First, who is involved: 1. The patient lacks capacity and cannot give input. 2. The patient's mother has been difficult to reach.  The limited contact (I am told) was a brief, "I told you to do everything."  [place] holds that surrogate decision makers must be "reasonably available."  From what I have been told, the mother has not been reasonably available to engage in a nuanced discussion of this issue.  So, I believe it is left to the physicians to decide based on risk benefit ratio.  If the benefits outweigh the risk and following the mother's admonition to do everything, the treatment should be offered.  However, if the risks outweigh the benefit, the treatment should not be offered.  The benefits of cranioplasty would be to decrease the risk of direct brain trauma for the unprotected portion of his brain.  It has been one year since his initial event.  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  With that bedridden existence, the possibility of brain trauma is low.  To support that notion of low benefit, none of the patient's caregivers has ever expressed concern that the unprotected head area poses concern or a barrier to the care of the patient.  The risks are consequential.  The surgical risks (bleeding, infection) are the most obvious.  There is also the risk of putting this patient through a painful procedure that he would not understand.  It would be difficult to assess and treat any post op pain given his cognitive limitations.    Dr. Vertell Limber seems to have been thoughtful in his approach  to decision making and to have followed the correct process.  In fact, he is going out of his way to do what he believes represents the best interests of the patient.  A decision to operate would face less scrutiny and would result in more remuneration.  He is trying to do the ethical thing.  Because this decision is a subjective weighing of the risk benefit ratio, it is good to have a second opinion.  If the primary care group agrees with Dr. Vertell Limber, they can be the second opinion.  If they disagree and feel surgery would be best, it would be prudent to obtain a second, neurosurgical opinion.

## 2020-07-03 LAB — CBC
HCT: 39.3 % (ref 39.0–52.0)
Hemoglobin: 12.4 g/dL — ABNORMAL LOW (ref 13.0–17.0)
MCH: 25.3 pg — ABNORMAL LOW (ref 26.0–34.0)
MCHC: 31.6 g/dL (ref 30.0–36.0)
MCV: 80 fL (ref 80.0–100.0)
Platelets: 412 10*3/uL — ABNORMAL HIGH (ref 150–400)
RBC: 4.91 MIL/uL (ref 4.22–5.81)
RDW: 16.6 % — ABNORMAL HIGH (ref 11.5–15.5)
WBC: 5.7 10*3/uL (ref 4.0–10.5)
nRBC: 0 % (ref 0.0–0.2)

## 2020-07-03 NOTE — Plan of Care (Signed)
  Problem: Education: Goal: Knowledge of General Education information will improve Description: Including pain rating scale, medication(s)/side effects and non-pharmacologic comfort measures Outcome: Progressing   Problem: Health Behavior/Discharge Planning: Goal: Ability to manage health-related needs will improve Outcome: Progressing   Problem: Clinical Measurements: Goal: Ability to maintain clinical measurements within normal limits will improve Outcome: Progressing Goal: Will remain free from infection Outcome: Progressing Goal: Diagnostic test results will improve Outcome: Progressing Goal: Respiratory complications will improve Outcome: Progressing Goal: Cardiovascular complication will be avoided Outcome: Progressing   Problem: Activity: Goal: Risk for activity intolerance will decrease Outcome: Progressing   Problem: Nutrition: Goal: Adequate nutrition will be maintained Outcome: Progressing   Problem: Coping: Goal: Level of anxiety will decrease Outcome: Progressing   Problem: Elimination: Goal: Will not experience complications related to bowel motility Outcome: Progressing Goal: Will not experience complications related to urinary retention Outcome: Progressing   Problem: Pain Managment: Goal: General experience of comfort will improve Outcome: Progressing   Problem: Safety: Goal: Ability to remain free from injury will improve Outcome: Progressing   Problem: Skin Integrity: Goal: Risk for impaired skin integrity will decrease Outcome: Progressing   Problem: Activity: Goal: Ability to tolerate increased activity will improve Outcome: Progressing   Problem: Respiratory: Goal: Ability to maintain a clear airway and adequate ventilation will improve Outcome: Progressing   Problem: Role Relationship: Goal: Method of communication will improve Outcome: Progressing   Problem: Activity: Goal: Ability to tolerate increased activity will  improve Outcome: Progressing   Problem: Respiratory: Goal: Ability to maintain a clear airway and adequate ventilation will improve Outcome: Progressing   Problem: Role Relationship: Goal: Method of communication will improve Outcome: Progressing

## 2020-07-03 NOTE — Progress Notes (Addendum)
[facility] PROGRESS NOTE  Joshua [name] RCB:[id number] DOB: 1981-09-02 DOA: 06/23/2020 PCP: Patient, [name]  Status: Inpatient--Remains inpatient appropriate because:Inpatient level of care appropriate due to severity of illness; clarification from ethics team regarding safety and efficacy of proceeding with cranioplasty   Dispo: The patient is from: SNF              Anticipated d/c is to: SNF              Anticipated d/c date is: 2 days              Patient currently is medically stable to d/c.  **Current situation complicated by patient lacking capacity and inability to give informed consent/input regarding care.  Ethics team has documented that the patient's mother has been difficult to reach and therefore was not reasonably available decisions regarding potential surgical procedure on the patient.  Ethics committee has determined extensive discussion with neurosurgical team.although cranioplasty would decrease the risk of direct brain trauma for the unprotected portion of his brain patient is bed ridden therefore possibility of brain trauma low and that the surgical risks including bleeding and infection are quite consequential therefore risk of procedure outweighs benefit.  I have discussed with attending physician Dr. Francia Greaves who agrees with ethics input therefore cranioplasty will not be pursued during this hospitalization. **Palliative care apparently consulted early during the hospitalization.  Attempted to contact patient's mother but she hung up on the provider.  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  On arrival to ED was intubated for airway protection.  CT of the head  shows new left frontal ICH.  He had an episode of tachycardia in the ED which was treated with adenosine.  He was extubated on 06/26/2020.  Neurology consulted and recommendations given.  Neurosurgery on board.  Patient was transferred from [facility] service to [facility] on 06/28/2020. Initial plan was for a cranioplasty later this week, but keeping in mind patient's baseline status, complications, Neurosurgery suggests and does not believe that benefits outweigh the risks.   Subjective: Patient is awake and alert.  Able to follow some simple commands.  Does grunt occasionally. Able to convey that he has no sensation in his lower extremities when asked. Interested in finding alternative means for communication such as an Fish farm manager. Patient ? understood when informed that risks of potential surgery that had been considered would be harmful to him.  Objective: Vitals:   07/03/20 0622 07/03/20 0823  BP: 105/84 113/90  Pulse: 70 66  Resp: 16 16  Temp: (!) 97.4 F (36.3 C) 97.8 F (36.6 C)  SpO2: 97% 97%    Intake/Output Summary (Last 24 hours) at 07/03/2020 1419 Last data filed at 07/03/2020 1300 Gross per 24 hour  Intake 487.5 ml  Output 800 ml  Net -312.5 ml   Filed Weights   06/28/20 0500 06/29/20 0500 07/02/20 0500  Weight: 58.4 kg 57.3 kg 65.8 kg    Exam:  Constitutional: NAD, calm, comfortable Eyes: PERRL, lids and conjunctivae normal ENMT: Mucous membranes are moist. Posterior pharynx clear of any exudate or lesions.Normal dentition.  Respiratory: clear to auscultation bilaterally, no wheezing, no crackles. Normal respiratory effort.  Room Cardiovascular: Regular rate and rhythm, no murmurs / rubs /  gallops. No extremity edema. 2+ pedal pulses. No carotid bruits.  Abdomen: no tenderness, no masses palpated. No hepatosplenomegaly. Bowel sounds positive. PEG tube in place. Musculoskeletal: no clubbing / cyanosis. No joint deformity upper and lower extremities. ?  Bilateral lower  extremity contractures at knees.  Muscle tone right upper extremity and bilateral lower extremities Skin: no rashes, lesions, ulcers. No induration Neurologic: CN 2-12 grossly intact based on visual inspection,. Sensation intact for extremities but no sensation in bilateral lower extremities, DTRs not tested. Strength 3-4/5 upper extremity, unable to use right upper extremity, no sensation or motor activity bilateral lower extremities Psychiatric: Unable to adequately assess given patient's nonverbal status.  He did appear to engage when addressed in verbal conversation, attempted to follow simple commands   Assessment/Plan: Acute left frontal intracranial hemorrhage/History of craniotomy and decompression craniectomy/associated chronic seizure disorder.   Patient was initially loaded with Keppra in the ED and evaluated by neurology.  Transitioned to oral Keppra.   Continuous EEG completed which does not show any overt seizures but was found to have epileptiform focus in the left frontal region.   Neurosurgery on board, suggested that proceeding with cranioplasty would not benefit him due to high risk of complications. Continue to monitor his mental status Given difficulty in contacting patient's mother Ethics physician was consulted-(see note for details) patient lacks capacity and cannot give input, patient's mother difficult to reach and surrogate decision makers must be reasonably available and it was determined mother has not been reasonably available.  Risks of proceeding with cranioplasty outweigh benefits given for now for proceeding in the first place was to decrease risk of direct brain trauma and patient is nonambulatory and bedridden at baseline.  Surgical risks include bleeding and infection.  The surgical procedure in and of itself would also be very painful. Patient appears to be at baseline at this time. He is non verbal, but is able to track providers in the room and feed  himself.  Chronic aphasia secondary to above Patient appears to have interest in obtaining alternative methodology for communicating SLP contacted for speech-language-unclear if we have letter board or iPad available for patient to use Patient able to use left hand but apparently at baseline prior to initial [place] may have been right-handed since to convey that he cannot write with his left hand when asked  Acute respiratory failure with hypoxia secondary to seizures s/p intubation  Extubated on 06/26/2020. Continue with pulmonary hygiene.   Patient is on room air with good sats. No sob or chest pain.   Possible aspiration event Chest x-ray 6/30 without infiltrate.   Has completed 3 days of Unasyn. Currently afebrile, no leukocytosis, .  SLP has evaluated with recommendations for regular diet thin liquids with intermittent supervision  Hypertensive urgency/hypertension Norvasc and hydralazine continue this admission.  Marked elevations in blood pressure at presentation likely related to underlying ICH physiology Continue with metoprolol 25 mg twice daily  Hypokalemia Replaced Potassium 3.8 on 7/5  Lactic acidosis.  Resolved.  Probably from seizures.   AKI Secondary to dehydration which has resolved Creatinine on admission is 2.62, improved to 1.4 but slight worsening to 1.8 as of 7/5 today.    Encourage oral intake. Repeat labs on 7/8  Leukocytosis  Reactive secondary to dehydration presenting symptomatology Afebrile and without any source of infection Most recent white count 5.7    Data Reviewed: Basic Metabolic Panel: Recent Labs  Lab 06/27/20 0250 06/28/20 0620 06/29/20 0500 07/01/20 0910  NA 144 141  138 139  K 3.8 3.5 3.9 3.8  CL 112* 107 104 105  CO2 24 22 25 22   GLUCOSE 136* 124* 106* 117*  BUN 16 18 21* 33*  CREATININE 1.72* 1.48* 1.46* 1.87*  CALCIUM 9.1 9.2 9.7 9.6  MG 2.1 1.7  --   --   PHOS 3.4 4.5  --   --    Liver Function Tests: No  results for input(s): AST, ALT, ALKPHOS, BILITOT, PROT, ALBUMIN in the last 168 hours. No results for input(s): LIPASE, AMYLASE in the last 168 hours. No results for input(s): AMMONIA in the last 168 hours. CBC: Recent Labs  Lab 06/27/20 0250 06/29/20 0500 07/03/20 0443  WBC 9.3 7.8 5.7  HGB 12.0* 13.0 12.4*  HCT 37.9* 40.4 39.3  MCV 79.0* 79.1* 80.0  PLT 209 303 412*   Cardiac Enzymes: No results for input(s): CKTOTAL, CKMB, CKMBINDEX, TROPONINI in the last 168 hours. BNP (last 3 results) No results for input(s): BNP in the last 8760 hours.  ProBNP (last 3 results) No results for input(s): PROBNP in the last 8760 hours.  CBG: Recent Labs  Lab 06/28/20 1950 06/29/20 0106 06/29/20 0400 06/29/20 0746 06/29/20 1134  GLUCAP 113* 87 99 123* 116*    Recent Results (from the past 240 hour(s))  Blood culture (routine x 2)     Status: None   Collection Time: 06/23/20  9:10 PM   Specimen: BLOOD  Result Value Ref Range Status   Specimen Description BLOOD LEFT ANTECUBITAL  Final   Special Requests   Final    BOTTLES DRAWN AEROBIC AND ANAEROBIC Blood Culture results may not be optimal due to an inadequate volume of blood received in culture bottles   Culture   Final    NO GROWTH 5 DAYS Performed at [facility], [place] [address] [place], [place] [address]    Report Status 06/28/2020 FINAL  Final  SARS Coronavirus 2 by RT PCR (hospital order, performed in [facility] hospital lab) Nasopharyngeal Nasopharyngeal Swab     Status: None   Collection Time: 06/23/20  9:15 PM   Specimen: Nasopharyngeal Swab  Result Value Ref Range Status   SARS Coronavirus 2 NEGATIVE NEGATIVE Final    Comment: (NOTE) SARS-CoV-2 target nucleic acids are NOT DETECTED.  The SARS-CoV-2 RNA is generally detectable in upper and lower respiratory specimens during the acute phase of infection. The lowest concentration of SARS-CoV-2 viral copies this assay can detect is 250 copies / mL. A negative  result does not preclude SARS-CoV-2 infection and should not be used as the sole basis for treatment or other patient management decisions.  A negative result may occur with improper specimen collection / handling, submission of specimen other than nasopharyngeal swab, presence of viral mutation(s) within the areas targeted by this assay, and inadequate number of viral copies (<250 copies / mL). A negative result must be combined with clinical observations, patient history, and epidemiological information.  Fact Sheet for Patients:   StrictlyIdeas.no  Fact Sheet for Healthcare Providers: BankingDealers.co.za  This test is not yet approved or  cleared by the [place] FDA and has been authorized for detection and/or diagnosis of SARS-CoV-2 by FDA under an Emergency Use Authorization (EUA).  This EUA will remain in effect (meaning this test can be used) for the duration of the COVID-19 declaration under Section 564(b)(1) of the Act, 21 U.S.C. section 360bbb-3(b)(1), unless the authorization is terminated or revoked sooner.  Performed at [facility], [place]  [address], [place], [place] [address]   Urine Culture     Status: None   Collection Time: 06/23/20 10:08 PM   Specimen: Urine, Random  Result Value Ref Range Status   Specimen Description URINE, RANDOM  Final   Special Requests NONE  Final   Culture   Final    NO GROWTH Performed at [facility], [place] [address] [place], [place] [address]    Report Status 06/25/2020 FINAL  Final  Blood culture (routine x 2)     Status: None   Collection Time: 06/24/20  1:03 AM   Specimen: BLOOD LEFT WRIST  Result Value Ref Range Status   Specimen Description BLOOD LEFT WRIST  Final   Special Requests   Final    BOTTLES DRAWN AEROBIC ONLY Blood Culture results may not be optimal due to an inadequate volume of blood received in culture bottles   Culture   Final    NO GROWTH 5  DAYS Performed at [facility], [place] [address] [place], [place] [address]    Report Status 06/29/2020 FINAL  Final  MRSA PCR Screening     Status: None   Collection Time: 06/24/20 12:36 PM   Specimen: Nasal Mucosa; Nasopharyngeal  Result Value Ref Range Status   MRSA by PCR NEGATIVE NEGATIVE Final    Comment:        The GeneXpert MRSA Assay (FDA approved for NASAL specimens only), is one component of a comprehensive MRSA colonization surveillance program. It is not intended to diagnose MRSA infection nor to guide or monitor treatment for MRSA infections. Performed at [facility], [place] [address], [place], [place] [address]      Studies: No results found.  Scheduled Meds:  baclofen  10 mg Per Tube QHS   chlorhexidine  15 mL Mouth Rinse BID   Chlorhexidine Gluconate Cloth  6 each Topical Daily   cloNIDine  0.3 mg Transdermal Weekly   feeding supplement (ENSURE ENLIVE)  237 mL Oral TID BM   levETIRAcetam  750 mg Per Tube BID   mouth rinse  15 mL Mouth Rinse q12n4p   metoprolol tartrate  25 mg Per Tube BID   multivitamin with minerals  1 tablet Oral Daily   Continuous Infusions:  Active Problems:   Intracranial hemorrhage ([place])   Seizure ([place])     Code Status: Full Family Communication: Patient only. DVT prophylaxis: SCDs in context of intracranial hemorrhage at time of presentation   Consultants:  Neurosurgery  PCCM  Ethics  Procedures:  None  Antibiotics:  None   Time spent: [place] ANP  [facility] Pager [phone]. If 7PM-7AM, please contact night-coverage at [email or web address] 07/03/2020, 2:19 PM  LOS: 10 days

## 2020-07-04 ENCOUNTER — Other Ambulatory Visit: Payer: Self-pay

## 2020-07-04 LAB — SARS CORONAVIRUS 2 BY RT PCR (HOSPITAL ORDER, PERFORMED IN ~~LOC~~ HOSPITAL LAB): SARS Coronavirus 2: NEGATIVE

## 2020-07-04 MED ORDER — ADULT MULTIVITAMIN W/MINERALS CH: 1.0000 | ORAL_TABLET | Freq: Every day | ORAL | Status: DC

## 2020-07-04 MED ORDER — METOPROLOL TARTRATE 25 MG PO TABS: 25.0000 mg | ORAL_TABLET | Freq: Two times a day (BID) | ORAL | Status: DC

## 2020-07-04 MED ORDER — LEVETIRACETAM 100 MG/ML PO SOLN: 750.0000 mg | Freq: Two times a day (BID) | ORAL | 12 refills | Status: DC

## 2020-07-04 NOTE — Progress Notes (Signed)
[facility] PROGRESS NOTE  Ashante Snelling IFO:[id number] DOB: Mar 12, 1981 DOA: 06/23/2020 PCP: Patient, [name]  Status: Inpatient--Remains inpatient appropriate because:Inpatient level of care appropriate due to severity of illness; clarification from ethics team regarding safety and efficacy of proceeding with cranioplasty   Dispo: The patient is from: SNF              Anticipated d/c is to: SNF              Anticipated d/c date is: 2 days              Patient currently is medically stable to d/c. **7/8 awaiting preapproval from Medicaid for patient to return to facility   **Current situation complicated by patient lacking capacity and inability to give informed consent/input regarding care.  Ethics team has documented that the patient's mother has been difficult to reach and therefore was not reasonably available decisions regarding potential surgical procedure on the patient.  Ethics committee has determined extensive discussion with neurosurgical team.although cranioplasty would decrease the risk of direct brain trauma for the unprotected portion of his brain patient is bed ridden therefore possibility of brain trauma low and that the surgical risks including bleeding and infection are quite consequential therefore risk of procedure outweighs benefit.  I have discussed with attending physician Dr. Francia Greaves who agrees with ethics input therefore cranioplasty will not be pursued during this hospitalization.  **Palliative care consulted early during the hospitalization.  Attempted to contact patient's mother but she hung up on the provider.  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   On arrival to ED was intubated for airway protection.  CT of the head shows new left frontal ICH.  He had an episode of tachycardia in the ED which was treated with adenosine.  He was extubated on 06/26/2020.  Neurology consulted and recommendations given.  Neurosurgery on board.  Patient was transferred from [facility] service to [facility] on 06/28/2020. Initial plan was for a cranioplasty later this week, but keeping in mind patient's baseline status, complications, Neurosurgery suggests and does not believe that benefits outweigh the risks.   Subjective: Patient is awake.  Denies pain when asked. Refused medications overnight.  Emphasized importance of him taking his pressure medications as prescribed to control blood pressure and prevent future brain hemorrhages  Objective: Vitals:   07/04/20 0333 07/04/20 0857  BP: (!) 133/96 (!) 128/96  Pulse: 67 63  Resp: 17 17  Temp: (!) 97.3 F (36.3 C) 97.6 F (36.4 C)  SpO2: 99% 100%    Intake/Output Summary (Last 24 hours) at 07/04/2020 1337 Last data filed at 07/04/2020 0900 Gross per 24 hour  Intake 360 ml  Output 200 ml  Net 160 ml   Filed Weights   06/29/20 0500 07/02/20 0500 07/04/20 0500  Weight: 57.3 kg 65.8 kg 63 kg    Exam:  Constitutional: NAD, calm, comfortable Respiratory: clear to auscultation bilaterally, no wheezing, no crackles. Normal respiratory effort.  Room Cardiovascular: Regular rate and rhythm, no murmurs / rubs / gallops. No extremity edema. 2+ pedal pulses.  Abdomen: no tenderness, no masses palpated. No hepatosplenomegaly. Bowel sounds positive. PEG tube in place/site unremarkable. Musculoskeletal: no clubbing / cyanosis. No joint deformity upper and lower extremities. ?  Bilateral  lower extremity contractures at knees. Skin: no rashes, lesions, ulcers. No induration Neurologic: CN 2-12 grossly intact based on visual inspection,. Sensation intact for extremities but no sensation in bilateral lower extremities, DTRs not tested.  Strength 3-4/5 upper extremity, unable to use right upper extremity, no sensation or motor activity bilateral lower extremities Psychiatric: Unable to adequately assess given patient's nonverbal status.  He did appear to engage when addressed in verbal conversation, attempted to follow simple commands   Assessment/Plan: Acute left frontal intracranial hemorrhage/History of craniotomy and decompression craniectomy/associated chronic seizure disorder.   Patient was initially loaded with Keppra in the ED and evaluated by neurology.  Transitioned to oral Keppra.   Continuous EEG completed which does not show any overt seizures but was found to have epileptiform focus in the left frontal region.   Neurosurgery on board, suggested that proceeding with cranioplasty would not benefit him due to high risk of complications. Continue to monitor his mental status Given difficulty in contacting patient's mother Ethics physician was consulted-(see note for details) patient lacks capacity and cannot give input, patient's mother difficult to reach and surrogate decision makers must be reasonably available and it was determined mother has not been reasonably available.  Risks of proceeding with cranioplasty outweigh benefits given for now for proceeding in the first place was to decrease risk of direct brain trauma and patient is nonambulatory and bedridden at baseline.  Surgical risks include bleeding and infection.  The surgical procedure in and of itself would also be very painful. Patient appears to be at baseline at this time. He is non verbal, but is able to track providers in the room and feed himself.  He is stable to discharge home  Chronic aphasia secondary to above At baseline  Acute respiratory failure with hypoxia secondary to seizures s/p intubation  Extubated on 06/26/2020. Patient is on room air with good sats.    Possible aspiration event Chest x-ray 6/30 without infiltrate.   Has completed 3  days of Unasyn. Currently afebrile, no leukocytosis, .  SLP has evaluated with recommendations for regular diet thin liquids with intermittent supervision  Hypertensive urgency/hypertension Continue Norvasc and hydralazine  Marked elevations in blood pressure at presentation likely related to underlying ICH physiology Continue with metoprolol 25 mg twice daily  Hypokalemia Replaced Potassium 3.8 on 7/5  Lactic acidosis.  Resolved.  Probably from seizures.   AKI Secondary to dehydration which has resolved Creatinine on admission is 2.62, improved to 1.4 and had increased to 1.8 as of 7/5  Encourage oral intake.  Leukocytosis  Reactive secondary to dehydration presenting symptomatology Afebrile and without any source of infection Most recent white count 5.7    Data Reviewed: Basic Metabolic Panel: Recent Labs  Lab 06/28/20 0620 06/29/20 0500 07/01/20 0910  NA 141 138 139  K 3.5 3.9 3.8  CL 107 104 105  CO2 22 25 22   GLUCOSE 124* 106* 117*  BUN 18 21* 33*  CREATININE 1.48* 1.46* 1.87*  CALCIUM 9.2 9.7 9.6  MG 1.7  --   --   PHOS 4.5  --   --    Liver Function Tests: No results for input(s): AST, ALT, ALKPHOS, BILITOT, PROT, ALBUMIN in the last 168 hours. No results for input(s): LIPASE, AMYLASE in the last 168 hours. No results for input(s): AMMONIA in the last 168 hours. CBC: Recent Labs  Lab 06/29/20 0500 07/03/20 0443  WBC 7.8 5.7  HGB 13.0 12.4*  HCT 40.4 39.3  MCV 79.1* 80.0  PLT 303 412*   Cardiac Enzymes: No results for input(s): CKTOTAL, CKMB, CKMBINDEX, TROPONINI in the last 168 hours. BNP (last 3 results) No results for input(s): BNP in the last 8760 hours.  ProBNP (last 3 results) No results for input(s): PROBNP in the last 8760 hours.  CBG: Recent Labs  Lab 06/28/20 1950 06/29/20 0106 06/29/20 0400 06/29/20 0746 06/29/20 1134  GLUCAP 113* 87 99 123* 116*    No results found for this or any previous visit (from the past 240  hour(s)).   Studies: No results found.  Scheduled Meds: . baclofen  10 mg Per Tube QHS  . chlorhexidine  15 mL Mouth Rinse BID  . Chlorhexidine Gluconate Cloth  6 each Topical Daily  . cloNIDine  0.3 mg Transdermal Weekly  . feeding supplement (ENSURE ENLIVE)  237 mL Oral TID BM  . levETIRAcetam  750 mg Per Tube BID  . mouth rinse  15 mL Mouth Rinse q12n4p  . metoprolol tartrate  25 mg Per Tube BID  . multivitamin with minerals  1 tablet Oral Daily   Continuous Infusions:  Active Problems:   Intracranial hemorrhage ([place])   Seizure ([place])     Code Status: Full Family Communication: Patient only. DVT prophylaxis: SCDs in context of intracranial hemorrhage at time of presentation   Consultants:  Neurosurgery  PCCM  Ethics  Procedures:  None  Antibiotics:  None   Time spent: [place]  [facility] Pager [phone]. If 7PM-7AM, please contact night-coverage at [email or web address] 07/04/2020, 1:37 PM  LOS: 11 days

## 2020-07-04 NOTE — NC FL2 (Signed)
[place] LEVEL OF CARE SCREENING TOOL     IDENTIFICATION  [name] Birthdate: 1981/12/13 Sex: male Admission Date (Current Location): 06/23/2020  [place] and [place] Number:  Kathleen Argue [id number] [place] and Address:  [facility] [place]. [facility], [place] [address] [place], [place] [address]      Provider Number: [id number]  Attending Physician Name and Address:  Kayleen Memos, DO  Relative Name and Phone Number:  [name] - mother - [phone]    Current Level of Care: Hospital Recommended Level of Care: [place] Prior Approval Number:    Date Approved/Denied:   PASRR Number: [id number]  Discharge Plan: SNF ([facility])    Current Diagnoses: Patient Active Problem List   Diagnosis Date Noted  . PEG (percutaneous endoscopic gastrostomy) status ([place])   . Palliative care by specialist   . Goals of care, counseling/discussion   . AKI (acute kidney injury) ([place])   . Essential hypertension 10/07/2019  . Severe protein-calorie malnutrition ([place]) 10/07/2019  . CKD (chronic kidney disease), stage IV ([place]) 10/07/2019  . Seizure ([place]) 10/07/2019  . Acute respiratory failure ([place])   . Hemorrhagic stroke ([place])   . Cytotoxic brain edema ([place]) 09/10/2019  . ICH (intracerebral hemorrhage) ([place]) 09/09/2019  . Subdural hematoma ([place]) 09/09/2019  . Intracranial hemorrhage ([place]) 09/09/2019    Orientation RESPIRATION BLADDER Height & Weight     Self (nods appropriately to questions)  Normal Incontinent Weight: 63 kg Height:  5\' 8"  (172.7 cm)  BEHAVIORAL SYMPTOMS/MOOD NEUROLOGICAL BOWEL NUTRITION STATUS    Convulsions/Seizures Incontinent Diet, Supplemental, Feeding tube (See Discharge Summary)  AMBULATORY STATUS COMMUNICATION OF NEEDS Skin     Verbally Other (Comment) (Right heal)                       Personal Care Assistance Level of Assistance  Bathing, Feeding, Dressing Bathing Assistance: Maximum  assistance Feeding assistance: Maximum assistance Dressing Assistance: Maximum assistance     Functional Limitations Info  Speech     Speech Info: Impaired    SPECIAL CARE FACTORS FREQUENCY  PT (By licensed PT), OT (By licensed OT)     PT Frequency: See Discharge summary OT Frequency: See Discharge Summary            Contractures Contractures Info: Not present    Additional Factors Info  Code Status, Allergies, Psychotropic Code Status Info: Full code Allergies Info: NKDA Psychotropic Info: Keppra         Current Medications (07/04/2020):  This is the current hospital active medication list Current Facility-Administered Medications  Medication Dose Route Frequency Provider Last Rate Last Admin  . acetaminophen (TYLENOL) tablet 650 mg  650 mg Per Tube Q6H PRN Collene Gobble, MD   650 mg at 06/25/20 2015  . baclofen (LIORESAL) tablet 10 mg  10 mg Per Tube QHS Collene Gobble, MD   10 mg at 07/02/20 2145  . chlorhexidine (PERIDEX) 0.12 % solution 15 mL  15 mL Mouth Rinse BID Collene Gobble, MD   15 mL at 07/02/20 2145  . Chlorhexidine Gluconate Cloth 2 % PADS 6 each  6 each Topical Daily Collene Gobble, MD   6 each at 07/04/20 1036  . cloNIDine (CATAPRES - Dosed in mg/24 hr) patch 0.3 mg  0.3 mg Transdermal Weekly Collene Gobble, MD   0.3 mg at 07/04/20 1034  . feeding supplement (ENSURE ENLIVE) (ENSURE ENLIVE) liquid 237 mL  237 mL Oral TID BM Hosie Poisson, MD   237 mL at 07/03/20 1405  . labetalol (NORMODYNE) injection 5 mg  5 mg Intravenous Q2H PRN Collene Gobble, MD   5 mg at 06/27/20 1720  . levETIRAcetam (KEPPRA) 100 MG/ML solution 750 mg  750 mg Per Tube BID Rumbarger, Rachel L, RPH   750 mg at 07/03/20 0921  . MEDLINE mouth rinse  15 mL Mouth Rinse q12n4p Collene Gobble, MD   15 mL at 07/02/20 1500  . metoprolol tartrate (LOPRESSOR) tablet 25 mg  25 mg Per Tube BID Hosie Poisson, MD   25 mg at 07/03/20 9381  . multivitamin with minerals tablet 1 tablet  1  tablet Oral Daily Hosie Poisson, MD   1 tablet at 07/03/20 0921  . polyethylene glycol (MIRALAX / GLYCOLAX) packet 17 g  17 g Oral Daily PRN Collene Gobble, MD         Discharge Medications: Please see discharge summary for a list of discharge medications.  Relevant Imaging Results:  Relevant Lab Results:   Additional Information SS#: [id number]  Curlene Labrum, RN

## 2020-07-04 NOTE — TOC Initial Note (Signed)
Transition of Care [facility]) - Initial/Assessment Note    Patient Details  Name: Joshua [name] MRN: [id number] Date of Birth: 09/22/1981  Transition [facility]) CM/SW Contact:    Curlene Labrum, RN Phone Number: 07/04/2020, 2:07 PM  Clinical Narrative:                 Case Management spoke with Dr. Nevada Crane this morning and the patient is clear medically to return to [facility].  FL2 completed and signed by physician.  Clinicals were placed in the hub so that [place], [place] at [place] could facilitate approval for insurance/ Medicaid to return the patient back to the nursing home.  Expected Discharge Plan: Long Term Nursing Home Barriers to Discharge: No Barriers Identified   Patient Goals and CMS Choice Patient states their goals for this hospitalization and ongoing recovery are:: Patient will be transferred back to [facility] CMS Medicare.gov Compare Post Acute Care list provided to:: Other (Comment Required) (No answer to family)    Expected Discharge Plan and Services Expected Discharge Plan: [place]   Discharge Planning Services: CM Consult Post Acute Care Choice: Long Term Acute Care (LTAC) Living arrangements for the past 2 months: [place]                                      Prior Living Arrangements/Services Living arrangements for the past 2 months: [place] Lives with:: Facility Resident Patient language and need for interpreter reviewed:: Yes Do you feel safe going back to the place where you live?: Yes      Need for Family Participation in Patient Care: No (Comment) Care giver support system in place?: Yes (comment)   Criminal Activity/Legal Involvement Pertinent to Current Situation/Hospitalization: No - Comment as needed  Activities of Daily Living   ADL Screening (condition at time of admission) Patient's cognitive ability adequate to safely complete daily  activities?: No Is the patient deaf or have difficulty hearing?: No Does the patient have difficulty seeing, even when wearing glasses/contacts?: No Does the patient have difficulty concentrating, remembering, or making decisions?: Yes Patient able to express need for assistance with ADLs?: Yes Does the patient have difficulty dressing or bathing?: Yes Independently performs ADLs?: No Communication: Needs assistance Is this a change from baseline?: Pre-admission baseline Dressing (OT): Needs assistance Is this a change from baseline?: Pre-admission baseline Grooming: Dependent Is this a change from baseline?: Pre-admission baseline Feeding: Dependent Is this a change from baseline?: Pre-admission baseline Bathing: Dependent Is this a change from baseline?: Pre-admission baseline Toileting: Dependent Is this a change from baseline?: Pre-admission baseline In/Out Bed: Dependent Is this a change from baseline?: Pre-admission baseline Walks in Home: Dependent Is this a change from baseline?: Pre-admission baseline Does the patient have difficulty walking or climbing stairs?: Yes Weakness of Legs: Right Weakness of Arms/Hands: Right  Permission Sought/Granted Permission sought to share information with : Case Manager, Customer service manager, Family Supports Permission granted to share information with : Yes, Verbal Permission Granted     Permission granted to share info w AGENCY: [facility]        Emotional Assessment Appearance:: Appears stated age Attitude/Demeanor/Rapport: Other (comment) (Nods to communicate) Affect (typically observed): Quiet Orientation: : Oriented to Self Alcohol / Substance Use: Not Applicable Psych Involvement: No (comment)  Admission diagnosis:  Hypokalemia [E87.6] Status epilepticus ([place]) [W80.321]  Seizure ([place]) [R56.9] Intracranial hemorrhage ([place]) [I62.9] Acute respiratory failure with hypoxia (HCC) [J96.01] Aspiration  pneumonia, unspecified aspiration pneumonia type, unspecified laterality, unspecified part of lung ([place]) [J69.0] Hypertension, unspecified type [I10] Patient Active Problem List   Diagnosis Date Noted  . PEG (percutaneous endoscopic gastrostomy) status ([place])   . Palliative care by specialist   . Goals of care, counseling/discussion   . AKI (acute kidney injury) ([place])   . Essential hypertension 10/07/2019  . Severe protein-calorie malnutrition ([place]) 10/07/2019  . CKD (chronic kidney disease), stage IV ([place]) 10/07/2019  . Seizure ([place]) 10/07/2019  . Acute respiratory failure ([place])   . Hemorrhagic stroke ([place])   . Cytotoxic brain edema ([place]) 09/10/2019  . ICH (intracerebral hemorrhage) ([place]) 09/09/2019  . Subdural hematoma ([place]) 09/09/2019  . Intracranial hemorrhage ([place]) 09/09/2019   PCP:  Patient, [name] Pharmacy:   [id number] - [place], [place] [address] Phone: [phone] Fax: [phone]     Social Determinants of Health (SDOH) Interventions    Readmission Risk Interventions Readmission Risk Prevention Plan 07/04/2020  Transportation Screening Complete  PCP or Specialist Appt within 5-7 Days Complete  Home Care Screening Complete  Medication Review (RN CM) Complete  Some recent data might be hidden

## 2020-07-04 NOTE — Progress Notes (Signed)
Physical Therapy Treatment Patient Details Name: Joshua [name] MRN: [id number] DOB: May 18, 1981 Today's Date: 07/04/2020    History of Present Illness Pt is a 39 year old man admitted on 06/23/20 from his SNF after falling OOB with break through seizure. 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 D/C to SNF in April 2021. Pt with residual aphasia, non verbal, with R hemiplegia.    PT Comments    Pt supine in bed on arrival, he was following commands well to move to edge of bed.  Once in standing positioned feet on stedy for attempt to sit to stand.  Pt was resistant then to all movements and returned back to bed.  He did tolerate 10 min sitting edge of bed.  Continue to recommend SNF placement at d/c.      Follow Up Recommendations  SNF;Supervision/Assistance - 24 hour     Equipment Recommendations  Other (comment) (TBA)    Recommendations for Other Services       Precautions / Restrictions Precautions Precautions: Fall Precaution Comments: PEG, incontinent of bowel Restrictions Weight Bearing Restrictions: No    Mobility  Bed Mobility Overal bed mobility: Needs Assistance Bed Mobility: Rolling;Sidelying to Sit;Supine to Sit Rolling: Max assist Sidelying to sit: Mod assist Supine to sit: Mod assist     General bed mobility comments: Pt in sidelying to the R side.  He was able to follow commands to come to sitting and maintains sitting upright at edge of bed.  He was resistant to transfer training to returned back to bed.  He continues to require assistance to move B LEs into and out of the bed.  B knees remain contracted.  Transfers                    Ambulation/Gait                 Stairs             Wheelchair Mobility    Modified Rankin (Stroke Patients Only)       Balance     Sitting balance-Leahy Scale: Fair Sitting balance - Comments: with L UE support.                                     Cognition Arousal/Alertness: Awake/alert Behavior During Therapy: WFL for tasks assessed/performed Overall Cognitive Status: History of cognitive impairments - at baseline                                        Exercises      General Comments        Pertinent Vitals/Pain Pain Assessment: Faces Faces Pain Scale: No hurt    Home Living                      Prior Function            PT Goals (current goals can now be found in the care plan section) Acute Rehab PT Goals Patient Stated Goal: not stated Potential to Achieve Goals: Fair Progress towards PT goals: Progressing toward goals    Frequency    Min 2X/week      PT Plan Current plan remains appropriate    Co-evaluation  AM-PAC PT "6 Clicks" Mobility   Outcome Measure  Help needed turning from your back to your side while in a flat bed without using bedrails?: A Lot Help needed moving from lying on your back to sitting on the side of a flat bed without using bedrails?: A Lot Help needed moving to and from a bed to a chair (including a wheelchair)?: A Lot Help needed standing up from a chair using your arms (e.g., wheelchair or bedside chair)?: A Lot Help needed to walk in hospital room?: Total Help needed climbing 3-5 steps with a railing? : Total 6 Click Score: 10    End of Session Equipment Utilized During Treatment: Gait belt Activity Tolerance: Patient tolerated treatment well;Patient limited by fatigue Patient left: with call bell/phone within reach;in chair;with chair alarm set Nurse Communication: Mobility status PT Visit Diagnosis: Other abnormalities of gait and mobility (R26.89);Muscle weakness (generalized) (M62.81);Other symptoms and signs involving the nervous system (R29.898);Hemiplegia and hemiparesis Hemiplegia - Right/Left: Right Hemiplegia - caused by: Nontraumatic intracerebral hemorrhage     Time: 1436-1500 PT Time Calculation  (min) (ACUTE ONLY): 24 min  Charges:  $Therapeutic Activity: 8-22 mins                     [name] , PTA Acute Rehabilitation Services Pager [phone] Office [phone]     Jaynie Hitch [name] 07/04/2020, 6:01 PM

## 2020-07-04 NOTE — Progress Notes (Signed)
I appreciate Dr. Lowella Bandy input.  I agree with his assessment of the risks/benefits of additional surgery, as well as his recommendations for a second opinion.  If the primary service agrees, then no further neurosurgical input is required.  If additional neurosurgical input is desired, then one of my partners will be available to give his opinion.

## 2020-07-04 NOTE — Plan of Care (Signed)
  Problem: Clinical Measurements: Goal: Respiratory complications will improve Outcome: Progressing Note: On room air   Problem: Nutrition: Goal: Adequate nutrition will be maintained Outcome: Progressing Note: Eating about 25% every meal   Problem: Coping: Goal: Level of anxiety will decrease Outcome: Progressing   Problem: Elimination: Goal: Will not experience complications related to urinary retention Outcome: Progressing   Problem: Pain Managment: Goal: General experience of comfort will improve Outcome: Progressing Note: No pain noted   Problem: Safety: Goal: Ability to remain free from injury will improve Outcome: Progressing

## 2020-07-04 NOTE — Progress Notes (Signed)
Pt not cooperative with care.  Would not take meds, allow application or removal of clonidine patch, allow flush of his IV, allow flush of Peg tube.

## 2020-07-04 NOTE — Evaluation (Signed)
Speech Language Pathology Evaluation Patient Details Name: Joshua [name] MRN: [id number] DOB: Oct 10, 1981 Today's Date: 07/04/2020 Time: 4917-9150 SLP Time Calculation (min) (ACUTE ONLY): 32 min  Problem List:  Patient Active Problem List   Diagnosis Date Noted  . PEG (percutaneous endoscopic gastrostomy) status ([place])   . Palliative care by specialist   . Goals of care, counseling/discussion   . AKI (acute kidney injury) ([place])   . Essential hypertension 10/07/2019  . Severe protein-calorie malnutrition ([place]) 10/07/2019  . CKD (chronic kidney disease), stage IV ([place]) 10/07/2019  . Seizure ([place]) 10/07/2019  . Acute respiratory failure ([place])   . Hemorrhagic stroke ([place])   . Cytotoxic brain edema ([place]) 09/10/2019  . ICH (intracerebral hemorrhage) ([place]) 09/09/2019  . Subdural hematoma ([place]) 09/09/2019  . Intracranial hemorrhage ([place]) 09/09/2019   Past Medical History: History reviewed. No pertinent past medical history. Past Surgical History:  Past Surgical History:  Procedure Laterality Date  . CRANIOTOMY Left 09/09/2019   Procedure: DECOMPRESSIVE CRANIECTOMY AND  HEMATOMA EVACUATION, IMPLANTATION OF SKULL FLAP IN RIGHT ABDOMEN;  Surgeon: Erline Levine, MD;  Location: [facility];  Service: Neurosurgery;  Laterality: Left;  . ESOPHAGOGASTRODUODENOSCOPY (EGD) WITH PROPOFOL N/A 09/28/2019   Procedure: ESOPHAGOGASTRODUODENOSCOPY (EGD) WITH PROPOFOL;  Surgeon: Georganna Skeans, MD;  Location: [facility] ENDOSCOPY;  Service: General;  Laterality: N/A;  . PEG PLACEMENT N/A 09/28/2019   Procedure: PERCUTANEOUS ENDOSCOPIC GASTROSTOMY (PEG) PLACEMENT;  Surgeon: Georganna Skeans, MD;  Location: [facility] ENDOSCOPY;  Service: General;  Laterality: N/A;   HPI:  Pt is a 39 year old man admitted on 06/23/20 from his SNF after falling OOB with break through seizure. 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 Intubated 6/27-6/30. CXR Interim extubation and removal of NG tube. Right base infiltrate consistent with pneumonia. Aspiration cannot  be excluded. 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 D/C to SNF in April 2021. Pt with residual aphasia, non verbal, with R hemiplegia. Familiar to [place]- had two bedside swallow assessments and treatments from 09/22/19 which he refused all po's, however on 12/13/19 he was acceting of water, Coke and crackers and was started on regular/thin liquids. He intermittently refused po's and ST discharged on 01/08/20.   Assessment / Plan / Recommendation Clinical Impression  Pt known to ST service from prior admission. He has baseline global aphasia. Evaluation requested in hopes of improving communication with staff during hospitalization. Pt was pleasant and cooperative for evaluation, but largely non-verbal. He imitated vowel sounds with acc'y in 2/5 trials despite max cues. 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 Pt's greatest strength was in his yes/no acc'y at simple level. Pt had 100% acc'y at basic biographical and environmental level, but acc'y declined with more complex questions. Pt answered yes/no by nodding/shaking head. He was also noted to use thumbs up x1 during session. Attempts were made at gestural/nonverbal communication via pointing, requesting pt point to certain objects/items on communication board. Pt was noted to point finger in the air, but not on the board. With max cues/modeling, pt imitated SLP by touching the paper, but was unable to make a meaningful communication attempt. At this time, pt/staff are recommended to utilize simple yes/no to communicate as much as possible. ST service to trial therapy pending participation/effort by patient. Recommend ST at next level of care.    SLP Assessment  SLP Recommendation/Assessment: Patient needs continued Speech Lanaguage Pathology Services SLP Visit Diagnosis: Apraxia (R48.2);Aphasia (R47.01)    Follow Up Recommendations  Inpatient Rehab;Skilled Nursing facility  Frequency and Duration min  2x/week  1 week      SLP Evaluation Cognition  Overall Cognitive Status: History of cognitive impairments - at baseline Arousal/Alertness: Awake/alert       Comprehension  Auditory Comprehension Overall Auditory Comprehension: Impaired Yes/No Questions: Impaired Complex Questions: 25-49% accurate Commands: Impaired One Step Basic Commands: 0-24% accurate Two Step Basic Commands: 0-24% accurate Multistep Basic Commands: 0-24% accurate Complex Commands: 0-24% accurate Interfering Components: Attention;Motor planning Reading Comprehension Reading Status: Impaired Word level: Impaired    Expression Expression Primary Mode of Expression: Nonverbal - gestures Verbal Expression Overall Verbal Expression: Impaired at baseline Initiation: Impaired Repetition: Impaired Level of Impairment: Word level Naming: Impairment Responsive: 0-25% accurate Confrontation: Impaired Pragmatics: No impairment Non-Verbal Means of Communication: Gestures Written Expression Written Expression: Not tested   Oral / Motor  Oral Motor/Sensory Function Overall Oral Motor/Sensory Function: Other (comment) (was unable due to apraxia- no overt focal weakness)                      [place] P. Musa Rewerts, M.S., [place] Pathologist Acute Rehabilitation Services Pager: [place] 07/04/2020, 1:55 PM

## 2020-07-05 DIAGNOSIS — G40909 Epilepsy, unspecified, not intractable, without status epilepticus: Secondary | ICD-10-CM | POA: Diagnosis not present

## 2020-07-05 DIAGNOSIS — I129 Hypertensive chronic kidney disease with stage 1 through stage 4 chronic kidney disease, or unspecified chronic kidney disease: Secondary | ICD-10-CM | POA: Diagnosis not present

## 2020-07-05 DIAGNOSIS — G8191 Hemiplegia, unspecified affecting right dominant side: Secondary | ICD-10-CM | POA: Diagnosis not present

## 2020-07-05 DIAGNOSIS — G939 Disorder of brain, unspecified: Secondary | ICD-10-CM | POA: Diagnosis present

## 2020-07-05 DIAGNOSIS — I619 Nontraumatic intracerebral hemorrhage, unspecified: Secondary | ICD-10-CM | POA: Diagnosis not present

## 2020-07-05 DIAGNOSIS — Z91148 Patient's other noncompliance with medication regimen for other reason: Secondary | ICD-10-CM

## 2020-07-05 DIAGNOSIS — I160 Hypertensive urgency: Secondary | ICD-10-CM | POA: Diagnosis present

## 2020-07-05 DIAGNOSIS — Z9114 Patient's other noncompliance with medication regimen: Secondary | ICD-10-CM

## 2020-07-05 DIAGNOSIS — N179 Acute kidney failure, unspecified: Secondary | ICD-10-CM

## 2020-07-05 DIAGNOSIS — E43 Unspecified severe protein-calorie malnutrition: Secondary | ICD-10-CM | POA: Diagnosis not present

## 2020-07-05 DIAGNOSIS — Z931 Gastrostomy status: Secondary | ICD-10-CM | POA: Diagnosis not present

## 2020-07-05 DIAGNOSIS — N184 Chronic kidney disease, stage 4 (severe): Secondary | ICD-10-CM | POA: Diagnosis not present

## 2020-07-05 MED ORDER — CLONIDINE 0.3 MG/24HR TD PTWK: 0.3000 mg | MEDICATED_PATCH | TRANSDERMAL | 12 refills | Status: DC

## 2020-07-05 NOTE — Discharge Summary (Signed)
Physician Discharge Summary  Edmund Holcomb MVH:[id number] DOB: [birth year] DOA: 06/23/2020  PCP: Patient, [name]  Admit date: 06/23/2020 Discharge date: 07/05/2020  Time spent: >30 minutes  Recommendations for Outpatient Follow-up:  1. Recommend check levetiracetam levels in 1 week 2. Patient has had several changes in medications as follows: Clonidine patch increased from 0.1 mg to 0.3 mg weekly, Keppra dosage increased from 500 mg to 750 mg twice daily, and metoprolol has also been added. 3. He also has a heel decubitus that will need to be evaluated by the registered nurse/wound care provider at time of admission.   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   Discharge Condition: Stable  Diet recommendation: Regular with thin liquids, has gastrostomy tube to use for supplemental feeds and medications  Filed Weights   07/02/20 0500 07/04/20 0500 07/05/20 0700  Weight: 65.8 kg 63 kg 63 kg    History of present illness:  39 year old gentleman with prior history of seizures on Keppra, 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 On arrival to ED was intubated for airway protection. CT of the head shows new left frontal ICH. He had an episode of tachycardia in the ED which was treated with adenosine. He was extubated on 06/26/2020. Neurology consulted and recommendations given. Neurosurgery on board. Patient was transferred from [facility] service to [facility] on 06/28/2020. Initial  plan was for a cranioplasty later this week, but keeping in mind patient's baseline status, complications, Neurosurgery suggests and does not believe that benefits outweigh the risks.  Hospital Course:   Acute left frontal intracranial hemorrhage/History of craniotomy and decompression craniectomy/associated chronic seizure disorder.  Patient was initially loaded with Keppra in the ED and evaluated by neurology. Transitioned to oral Keppra with dose increased to 750 mg twice daily this admission Continuous EEG completed which does not show any overt seizures but was found to have epileptiform focus in the left frontal region.  Neurosurgery on board, suggested that proceeding with cranioplasty would not benefit him due to high risk of complications.Continue to monitor his mental status Given difficulty in contacting patient's mother Ethics physician was consulted-(see note for details) patient lacks capacity and cannot give input, patient's mother difficult to reach and surrogate decision makers must be reasonably available and it was determined mother has not been reasonably available.  Risks of proceeding with cranioplasty outweigh benefits for now; rationale for proceeding in the first place was to decrease risk of direct brain trauma and patient is nonambulatory and bedridden at baseline.  Surgical risks include bleeding and infection.  The surgical procedure in and of itself would also be very painful. Patient appears to be at baseline at this time. He is non verbal, but is able to track providers in the room and feed himself.  He is stable to discharge home  Chronic aphasia secondary to above At baseline  Acute respiratory failure with hypoxia secondary to seizures s/p intubation  Extubated on 06/26/2020. Patient is on room air with good sats.   Possible aspiration event Chest x-ray 6/30 without infiltrate.  Has completed 3 days of Unasyn. Currently afebrile, no  leukocytosis,. SLP has evaluated with recommendations for regular diet thin liquids with intermittent supervision  Hypertensive urgency/hypertension Noncompliance with medical therapies due to intermittent dosing of prescribed medication Continue Norvasc, clonidine and hydralazine  Marked elevations in blood pressure at presentation likely related to underlying ICH physiology Continue with metoprolol 25 mg twice daily which was added during this admission Clonidine dose was increased from 0.1 to 0.3 mg weekly during this admission Patient's blood pressures have been labile during the hospitalization primarily due to patient refusing/inconsistently taking medications-he has also refused clonidine patch at times.  At times refuses to allow staff to give medications through PEG tube.  This appears to be a chronic behavior and would not be reason to keep patient in the hospital regards to blood pressure control.  Hypokalemia Replaced Potassium 3.8 on 7/5  Lactic acidosis. Resolved. Probably from seizures.   AKI Secondary to dehydration which has resolved Creatinine on admission is 2.62, improved to 1.4 and had increased to 1.8 as of 7/5  Encourage oral intake.  Leukocytosis  Reactive secondary to dehydration presenting symptomatology Afebrile and without any source of infection Most recent white count 5.7  Procedures: None  Consultations:  Neurosurgery  PCCM  Ethics   Discharge Exam: Vitals:   07/05/20 0301 07/05/20 0817  BP: (!) 139/112 (!) 139/108  Pulse: 66 63  Resp: 18 18  Temp: 98.1 F (36.7 C) 98.3 F (36.8 C)  SpO2: 98% 100%   Constitutional: NAD, calm, comfortable Respiratory: clear to auscultation bilaterally, no wheezing, no crackles. Normal respiratory effort.  Room Cardiovascular: Regular rate and rhythm, no murmurs / rubs / gallops. No extremity edema. 2+ pedal pulses.  Abdomen: no tenderness, no masses palpated. No hepatosplenomegaly. Bowel  sounds positive. PEG tube in place/site unremarkable. Musculoskeletal: no clubbing / cyanosis. No joint deformity upper and lower extremities. ?  Bilateral lower extremity contractures at knees. Skin: no rashes, lesions, ulcers. No induration Neurologic: CN 2-12 grossly intact based on visual inspection,. Sensation intact for extremities but no sensation in bilateral lower extremities, DTRs not tested. Strength 3-4/5 upper extremity, unable to use right upper extremity, no sensation or motor activity bilateral lower extremities Psychiatric: Unable to adequately assess given patient's nonverbal status.  He did appear to engage when addressed in verbal conversation, attempted to follow simple commands  Discharge Instructions   Discharge Instructions    Diet - low sodium heart healthy   Complete by: As directed    Discharge wound care:   Complete by: As directed    Please have wound care nurse at facility evaluate wound and initiate wound care per protocol Recommend consider Santyl covered by occlusive dressing to be changed daily.   Increase activity slowly   Complete by: As directed      Allergies as of 07/05/2020   No Known Allergies     Medication List    STOP taking these medications   cloNIDine 0.1 mg/24hr patch Commonly known as: CATAPRES - Dosed in mg/24 hr Replaced by: cloNIDine 0.3 mg/24hr patch     TAKE these medications   acetaminophen 325 MG tablet Commonly known as: TYLENOL Place 2 tablets (650 mg total) into feeding tube every 4 (four) hours as needed for mild pain (temp > 100.5). What changed:   when to take this  additional instructions   amLODipine 10 MG tablet Commonly known as: NORVASC Place 10 mg into feeding tube in the morning.   baclofen 10 MG  tablet Commonly known as: LIORESAL Place 10 mg into feeding tube at bedtime.   cloNIDine 0.3 mg/24hr patch Commonly known as: CATAPRES - Dosed in mg/24 hr Place 1 patch (0.3 mg total) onto the skin once a  week. Start taking on: July 10, 2020 Replaces: cloNIDine 0.1 mg/24hr patch   feeding supplement (JEVITY 1.2 CAL) Liqd Place 474 mLs into feeding tube in the morning, at noon, in the evening, and at bedtime.   feeding supplement (PRO-STAT SUGAR FREE 64) Liqd Place 30 mLs into feeding tube 4 (four) times daily.   free water Soln Place 200 mLs into feeding tube every 4 (four) hours. What changed:   how much to take  when to take this  additional instructions   levETIRAcetam 100 MG/ML solution Commonly known as: KEPPRA Place 7.5 mLs (750 mg total) into feeding tube 2 (two) times daily. What changed: how much to take   magnesium hydroxide 400 MG/5ML suspension Commonly known as: MILK OF MAGNESIA Place 30 mLs into feeding tube every 12 (twelve) hours as needed for mild constipation ("for 48 hours").   metoprolol tartrate 25 MG tablet Commonly known as: LOPRESSOR Place 1 tablet (25 mg total) into feeding tube 2 (two) times daily.   multivitamin with minerals Tabs tablet Take 1 tablet by mouth daily.   NON FORMULARY Take by mouth See admin instructions. Magic Cup Frozen Desserts- One cup by mouth two times a day- with lunch and supper   polyethylene glycol 17 g packet Commonly known as: MIRALAX / GLYCOLAX Place 17 g into feeding tube 2 (two) times daily. What changed:   when to take this  additional instructions            Discharge Care Instructions  (From admission, onward)         Start     Ordered   07/05/20 0000  Discharge wound care:       Comments: Please have wound care nurse at facility evaluate wound and initiate wound care per protocol Recommend consider Santyl covered by occlusive dressing to be changed daily.   07/05/20 0951         No Known Allergies    The results of significant diagnostics from this hospitalization (including imaging, microbiology, ancillary and laboratory) are listed below for reference.    Significant Diagnostic  Studies: CT Head Wo Contrast  Result Date: 06/23/2020 CLINICAL DATA:  Un witnessed fall, seizure, unresponsive, previous craniotomy EXAM: CT HEAD WITHOUT CONTRAST TECHNIQUE: Contiguous axial images were obtained from the base of the skull through the vertex without intravenous contrast. COMPARISON:  09/17/2019 FINDINGS: Brain: There is a small amount of acute hemorrhage within the left frontal cortex, measuring 9 mm in size. This is in an area of extensive encephalomalacia with extracranial herniation at the site of chronic craniectomy defect. Hypodensities are seen throughout the right-sided periventricular and subcortical white matter, slightly progressive since prior study but likely reflecting chronic small vessel ischemic change. There is ex vacuo dilatation of the left lateral ventricle due to large area of encephalomalacia from prior left basal ganglia hemorrhage. Remaining midline structures are unremarkable. Vascular: No hyperdense vessel or unexpected calcification. Skull: Large left frontotemporal craniectomy defect. No acute bony abnormalities. Sinuses/Orbits: Mucosal thickening is seen within the ethmoid air cells and right maxillary sinus. Remaining sinuses are clear. Other: None. IMPRESSION: 1. 9 mm area of acute intraparenchymal hemorrhage left frontal cortex. 2. Extensive encephalomalacia throughout the left cerebral hemisphere related to sequela from previous intraparenchymal hemorrhage. 3. 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 4. Likely chronic small-vessel ischemic changes throughout the right-sided periventricular white matter, progressive since prior exam. These results were called by telephone at the time of interpretation on 06/23/2020 at 10:16 pm to provider [facility] , who verbally acknowledged these results. Electronically Signed   By: Randa Ngo M.D.   On: 06/23/2020 22:20   DG Chest Port 1 View  Result Date: 06/27/2020 CLINICAL DATA:   Acute respiratory failure.  Hypoxia. EXAM: PORTABLE CHEST 1 VIEW COMPARISON:  06/26/2020.  06/23/2020. FINDINGS: Interim extubation and removal of NG tube. Right base infiltrate consistent with pneumonia. Aspiration cannot be excluded. No pleural effusion or pneumothorax. Heart size stable. No pulmonary venous congestion. No acute bony abnormality identified. IMPRESSION: Interim extubation and removal of NG tube. Right base infiltrate consistent with pneumonia. Aspiration cannot be excluded Electronically Signed   By: Marcello Moores  Register   On: 06/27/2020 06:56   DG CHEST PORT 1 VIEW  Result Date: 06/26/2020 CLINICAL DATA:  Respiratory failure. EXAM: PORTABLE CHEST 1 VIEW COMPARISON:  06/23/2020 FINDINGS: The endotracheal tube is 3.5 cm above the carina. The NG tube is coursing down the esophagus and into the stomach. The cardiac silhouette, mediastinal and hilar contours are within normal limits and stable. Streaky basilar atelectasis but no infiltrates, edema or effusions. IMPRESSION: 1. Stable support apparatus. 2. Streaky bibasilar atelectasis.  No edema or effusions. Electronically Signed   By: Marijo Sanes M.D.   On: 06/26/2020 07:57   DG Chest Portable 1 View  Result Date: 06/23/2020 CLINICAL DATA:  Intubated EXAM: PORTABLE CHEST 1 VIEW COMPARISON:  09/28/2019 FINDINGS: Frontal view of the chest demonstrates endotracheal tube overlying tracheal air column tip at level of thoracic inlet. Enteric catheter passes below diaphragm tip excluded by collimation, side port projecting over gastric body. Cardiac silhouette is unremarkable. No airspace disease, effusion, or pneumothorax. No acute bony abnormalities. IMPRESSION: 1. Support devices as above. 2. No acute intrathoracic process. Electronically Signed   By: Randa Ngo M.D.   On: 06/23/2020 21:26   DG Swallowing Func-Speech Pathology  Result Date: 06/28/2020 Objective Swallowing Evaluation: Type of Study: MBS-Modified Barium Swallow Study  Patient  Details Name: Ward Boissonneault MRN: [id number] Date of Birth: 07/10/1981 Today's Date: 06/28/2020 Time: SLP Start Time (ACUTE ONLY): 1002 -SLP Stop Time (ACUTE ONLY): 1016 SLP Time Calculation (min) (ACUTE ONLY): 14 min Past Medical History: No past medical history on file. Past Surgical History: Past Surgical History: Procedure Laterality Date . CRANIOTOMY Left 09/09/2019  Procedure: DECOMPRESSIVE CRANIECTOMY AND  HEMATOMA EVACUATION, IMPLANTATION OF SKULL FLAP IN RIGHT ABDOMEN;  Surgeon: Erline Levine, MD;  Location: [facility];  Service: Neurosurgery;  Laterality: Left; . ESOPHAGOGASTRODUODENOSCOPY (EGD) WITH PROPOFOL N/A 09/28/2019  Procedure: ESOPHAGOGASTRODUODENOSCOPY (EGD) WITH PROPOFOL;  Surgeon: Georganna Skeans, MD;  Location: [facility] ENDOSCOPY;  Service: General;  Laterality: N/A; . PEG PLACEMENT N/A 09/28/2019  Procedure: PERCUTANEOUS ENDOSCOPIC GASTROSTOMY (PEG) PLACEMENT;  Surgeon: Georganna Skeans, MD;  Location: [facility] ENDOSCOPY;  Service: General;  Laterality: N/A; HPI: Pt is a 39 year old man admitted on 06/23/20 from his SNF after falling OOB with break through seizure. 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 Intubated 6/27-6/30. CXR Interim extubation and removal of NG tube. Right base infiltrate consistent with pneumonia. Aspiration cannot be excluded. 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 D/C to SNF in April 2021. Pt with residual aphasia, non verbal, with R hemiplegia. Familiar to [place]- had two bedside swallow assessments and treatments from  09/22/19 which he refused all po's, however on 12/12/20 he was acceting of water, Coke and crackers and was started on regular/thin liquids. He intermittently refused po's and ST discharged on 01/08/20.  No data recorded Assessment / Plan / Recommendation CHL IP CLINICAL IMPRESSIONS 06/28/2020 Clinical Impression [place] demonstrated minimal oral dysphagia and functional pharyngeal dysphagia despite recent ICH, 4 day intubation and history of ICH in 2020. Piecemeal transit of  thin intermittently and minimal premature spill. Overall swallow was timely with one trial of thin reaching valleculae for 4-5 seconds before swallow initiated. Trace-minimal residue in valleculae and pyriform sinuses cleairng with subequent swallows. Laryngeal vestibule was protected throughout each swallow thus preventing penetration and aspiration. He independently consumed small-medium size sips. Esophageal scan was unremarkable. A regular texture diet and thin liquids ordered, straws allowed, pills whole in puree and full supervision initially to ensure following precautions and continued ST.      SLP Visit Diagnosis Dysphagia, unspecified (R13.10) Attention and concentration deficit following -- Frontal lobe and executive function deficit following -- Impact on safety and function Mild aspiration risk;Moderate aspiration risk   CHL IP TREATMENT RECOMMENDATION 06/28/2020 Treatment Recommendations Defer until completion of intrumental exam   Prognosis 12/13/2019 Prognosis for Safe Diet Advancement Guarded Barriers to Reach Goals Behavior Barriers/Prognosis Comment -- CHL IP DIET RECOMMENDATION 01/03/2020 SLP Diet Recommendations -- Liquid Administration via -- Medication Administration -- Compensations Minimize environmental distractions Postural Changes --   CHL IP OTHER RECOMMENDATIONS 12/13/2019 Recommended Consults -- Oral Care Recommendations -- Other Recommendations Have oral suction available   CHL IP FOLLOW UP RECOMMENDATIONS 06/28/2020 Follow up Recommendations Skilled Nursing facility   [facility] IP FREQUENCY AND DURATION 12/13/2019 Speech Therapy Frequency (ACUTE ONLY) min 2x/week Treatment Duration 2 weeks      CHL IP ORAL PHASE 06/28/2020 Oral Phase Impaired Oral - Pudding Teaspoon -- Oral - Pudding Cup -- Oral - Honey Teaspoon -- Oral - Honey Cup -- Oral - Nectar Teaspoon -- Oral - Nectar Cup -- Oral - Nectar Straw -- Oral - Thin Teaspoon -- Oral - Thin Cup Piecemeal swallowing;Premature spillage Oral - Thin  Straw WFL Oral - Puree -- Oral - Mech Soft -- Oral - Regular Delayed oral transit;Other (Comment) Oral - Multi-Consistency -- Oral - Pill -- Oral Phase - Comment --  CHL IP PHARYNGEAL PHASE 06/28/2020 Pharyngeal Phase Impaired Pharyngeal- Pudding Teaspoon -- Pharyngeal -- Pharyngeal- Pudding Cup -- Pharyngeal -- Pharyngeal- Honey Teaspoon -- Pharyngeal -- Pharyngeal- Honey Cup -- Pharyngeal -- Pharyngeal- Nectar Teaspoon -- Pharyngeal -- Pharyngeal- Nectar Cup -- Pharyngeal -- Pharyngeal- Nectar Straw -- Pharyngeal -- Pharyngeal- Thin Teaspoon -- Pharyngeal -- Pharyngeal- Thin Cup Pharyngeal residue - valleculae;Pharyngeal residue - pyriform Pharyngeal -- Pharyngeal- Thin Straw Pharyngeal residue - valleculae;Pharyngeal residue - pyriform;Delayed swallow initiation-vallecula Pharyngeal -- Pharyngeal- Puree -- Pharyngeal -- Pharyngeal- Mechanical Soft -- Pharyngeal -- Pharyngeal- Regular WFL Pharyngeal -- Pharyngeal- Multi-consistency -- Pharyngeal -- Pharyngeal- Pill -- Pharyngeal -- Pharyngeal Comment --  CHL IP CERVICAL ESOPHAGEAL PHASE 06/28/2020 Cervical Esophageal Phase WFL Pudding Teaspoon -- Pudding Cup -- Honey Teaspoon -- Honey Cup -- Nectar Teaspoon -- Nectar Cup -- Nectar Straw -- Thin Teaspoon -- Thin Cup -- Thin Straw -- Puree -- Mechanical Soft -- Regular -- Multi-consistency -- Pill -- Cervical Esophageal Comment -- [name] 06/28/2020, 10:52 AM [name].Ed Actor Pager [phone] Office [phone]              Overnight EEG with video  Result Date: 06/24/2020 Hortense Ramal,  Cecil Cranker, MD     06/24/2020  7:25 AM [name] MRN: [id number] Epilepsy Attending: Lora Havens Referring Physician/Provider: Elease Etienne, PA Duration: 06/23/2020 2319 to 06/24/2020 0720 Patient history: 39 year old male with status epilepticus and fall. CT head showed small left frontal IPH. EEG to evaluate for seizure Level of alertness: Comatose AEDs during EEG  study: LEV Technical aspects: This EEG study was done with scalp electrodes positioned according to the 10-20 International system of electrode placement. Electrical activity was acquired at a sampling rate of 500Hz  and reviewed with a high frequency filter of 70Hz  and a low frequency filter of 1Hz . EEG data were recorded continuously and digitally stored. Description: EEG showed continuous generalized and maximal left frontal region 2-3Hz  delta slowing. Sharp waves were also seen in left frontal region. Hyperventilation and photic stimulation were not performed.   ABNORMALITY -Continuous slow, generalized and maximal left frontal region -Sharp wave, left frontal region IMPRESSION: This study showed evidence of potential epileptogenicity as well as cortical dysfunction in left frontal region likely secondary to underlying structural abnormality. Additionally, there is evidence of severe diffuse encephalopathy, non specific to etiology but could be related to sedation. No seizures were seen throughout the recording. Lora Havens    Microbiology: Recent Results (from the past 240 hour(s))  SARS Coronavirus 2 by RT PCR (hospital order, performed in [facility] hospital lab) Nasopharyngeal Nasopharyngeal Swab     Status: None   Collection Time: 07/04/20  2:24 PM   Specimen: Nasopharyngeal Swab  Result Value Ref Range Status   SARS Coronavirus 2 NEGATIVE NEGATIVE Final    Comment: (NOTE) SARS-CoV-2 target nucleic acids are NOT DETECTED.  The SARS-CoV-2 RNA is generally detectable in upper and lower respiratory specimens during the acute phase of infection. The lowest concentration of SARS-CoV-2 viral copies this assay can detect is 250 copies / mL. A negative result does not preclude SARS-CoV-2 infection and should not be used as the sole basis for treatment or other patient management decisions.  A negative result may occur with improper specimen collection / handling, submission of specimen  other than nasopharyngeal swab, presence of viral mutation(s) within the areas targeted by this assay, and inadequate number of viral copies (<250 copies / mL). A negative result must be combined with clinical observations, patient history, and epidemiological information.  Fact Sheet for Patients:   StrictlyIdeas.no  Fact Sheet for Healthcare Providers: BankingDealers.co.za  This test is not yet approved or  cleared by the [place] FDA and has been authorized for detection and/or diagnosis of SARS-CoV-2 by FDA under an Emergency Use Authorization (EUA).  This EUA will remain in effect (meaning this test can be used) for the duration of the COVID-19 declaration under Section 564(b)(1) of the Act, 21 U.S.C. section 360bbb-3(b)(1), unless the authorization is terminated or revoked sooner.  Performed at [facility], [place] [address], [place], [place] [address]      Labs: Basic Metabolic Panel: Recent Labs  Lab 06/29/20 0500 07/01/20 0910  NA 138 139  K 3.9 3.8  CL 104 105  CO2 25 22  GLUCOSE 106* 117*  BUN 21* 33*  CREATININE 1.46* 1.87*  CALCIUM 9.7 9.6   Liver Function Tests: No results for input(s): AST, ALT, ALKPHOS, BILITOT, PROT, ALBUMIN in the last 168 hours. No results for input(s): LIPASE, AMYLASE in the last 168 hours. No results for input(s): AMMONIA in the last 168 hours. CBC: Recent Labs  Lab 06/29/20 0500 07/03/20  0443  WBC 7.8 5.7  HGB 13.0 12.4*  HCT 40.4 39.3  MCV 79.1* 80.0  PLT 303 412*   Cardiac Enzymes: No results for input(s): CKTOTAL, CKMB, CKMBINDEX, TROPONINI in the last 168 hours. BNP: BNP (last 3 results) No results for input(s): BNP in the last 8760 hours.  ProBNP (last 3 results) No results for input(s): PROBNP in the last 8760 hours.  CBG: Recent Labs  Lab 06/28/20 1950 06/29/20 0106 06/29/20 0400 06/29/20 0746 06/29/20 1134  GLUCAP 113* 87 99 123* 116*        Signed:  Erin Hearing ANP.  [facility] 07/05/2020, 10:12 AM

## 2020-07-05 NOTE — Plan of Care (Signed)
  Problem: Education: Goal: Knowledge of General Education information will improve Description: Including pain rating scale, medication(s)/side effects and non-pharmacologic comfort measures Outcome: Progressing   Problem: Health Behavior/Discharge Planning: Goal: Ability to manage health-related needs will improve Outcome: Progressing   Problem: Clinical Measurements: Goal: Ability to maintain clinical measurements within normal limits will improve Outcome: Progressing Goal: Will remain free from infection Outcome: Progressing Goal: Diagnostic test results will improve Outcome: Progressing Goal: Respiratory complications will improve Outcome: Progressing Goal: Cardiovascular complication will be avoided Outcome: Progressing   Problem: Activity: Goal: Risk for activity intolerance will decrease Outcome: Progressing   Problem: Nutrition: Goal: Adequate nutrition will be maintained Outcome: Progressing   Problem: Coping: Goal: Level of anxiety will decrease Outcome: Progressing   Problem: Elimination: Goal: Will not experience complications related to bowel motility Outcome: Progressing Goal: Will not experience complications related to urinary retention Outcome: Progressing   Problem: Pain Managment: Goal: General experience of comfort will improve Outcome: Progressing   Problem: Safety: Goal: Ability to remain free from injury will improve Outcome: Progressing   Problem: Skin Integrity: Goal: Risk for impaired skin integrity will decrease Outcome: Progressing   Problem: Activity: Goal: Ability to tolerate increased activity will improve Outcome: Progressing   Problem: Respiratory: Goal: Ability to maintain a clear airway and adequate ventilation will improve Outcome: Progressing   Problem: Role Relationship: Goal: Method of communication will improve Outcome: Progressing   Problem: Activity: Goal: Ability to tolerate increased activity will  improve Outcome: Progressing   Problem: Respiratory: Goal: Ability to maintain a clear airway and adequate ventilation will improve Outcome: Progressing   Problem: Role Relationship: Goal: Method of communication will improve Outcome: Progressing

## 2020-07-05 NOTE — TOC Progression Note (Signed)
Transition of Care [facility]) - Progression Note    Patient Details  Name: Joshua [name] MRN: [id number] Date of Birth: [birth year]  Transition of Care [facility]) CM/SW [place], RN Phone Number: 07/05/2020, 12:49 PM  Clinical Narrative:    Case management spoke with Chantelle Little, CM at [facility] and the facility is still waiting on confirmation for insurance authorization.  I placed all clinicals and discharge summary in the hub for the facility so as soon as authorization is approved I will call [facility] for transport.   Expected Discharge Plan: Long Term Nursing Home Barriers to Discharge: No Barriers Identified  Expected Discharge Plan and Services Expected Discharge Plan: [place]   Discharge Planning Services: CM Consult Post Acute Care Choice: Long Term Acute Care (LTAC) Living arrangements for the past 2 months: [place] Expected Discharge Date: 07/05/20                     Social Determinants of Health (SDOH) Interventions    Readmission Risk Interventions Readmission Risk Prevention Plan 07/04/2020  Transportation Screening Complete  PCP or Specialist Appt within 5-7 Days Complete  Home Care Screening Complete  Medication Review (RN CM) Complete  Some recent data might be hidden

## 2020-07-05 NOTE — Progress Notes (Signed)
Discharge package printed and sent with transport. Called [facility] and report given to nurse [place].

## 2020-07-08 DIAGNOSIS — I629 Nontraumatic intracranial hemorrhage, unspecified: Secondary | ICD-10-CM | POA: Diagnosis not present

## 2020-07-08 DIAGNOSIS — I10 Essential (primary) hypertension: Secondary | ICD-10-CM | POA: Diagnosis not present

## 2020-07-08 DIAGNOSIS — E878 Other disorders of electrolyte and fluid balance, not elsewhere classified: Secondary | ICD-10-CM | POA: Diagnosis not present

## 2020-07-08 DIAGNOSIS — J690 Pneumonitis due to inhalation of food and vomit: Secondary | ICD-10-CM | POA: Diagnosis not present

## 2020-07-08 DIAGNOSIS — Z5329 Procedure and treatment not carried out because of patient's decision for other reasons: Secondary | ICD-10-CM | POA: Diagnosis not present

## 2020-07-08 DIAGNOSIS — G4089 Other seizures: Secondary | ICD-10-CM | POA: Diagnosis not present

## 2020-07-08 DIAGNOSIS — F0151 Vascular dementia with behavioral disturbance: Secondary | ICD-10-CM | POA: Diagnosis not present

## 2020-07-08 DIAGNOSIS — I479 Paroxysmal tachycardia, unspecified: Secondary | ICD-10-CM | POA: Diagnosis not present

## 2020-07-12 DIAGNOSIS — G4089 Other seizures: Secondary | ICD-10-CM | POA: Diagnosis not present

## 2020-07-12 DIAGNOSIS — I629 Nontraumatic intracranial hemorrhage, unspecified: Secondary | ICD-10-CM | POA: Diagnosis not present

## 2020-07-12 DIAGNOSIS — Z5329 Procedure and treatment not carried out because of patient's decision for other reasons: Secondary | ICD-10-CM | POA: Diagnosis not present

## 2020-07-17 DIAGNOSIS — Z931 Gastrostomy status: Secondary | ICD-10-CM | POA: Diagnosis not present

## 2020-07-17 DIAGNOSIS — I129 Hypertensive chronic kidney disease with stage 1 through stage 4 chronic kidney disease, or unspecified chronic kidney disease: Secondary | ICD-10-CM | POA: Diagnosis not present

## 2020-07-17 DIAGNOSIS — L89610 Pressure ulcer of right heel, unstageable: Secondary | ICD-10-CM | POA: Diagnosis not present

## 2020-07-17 DIAGNOSIS — E43 Unspecified severe protein-calorie malnutrition: Secondary | ICD-10-CM | POA: Diagnosis not present

## 2020-07-17 DIAGNOSIS — G40909 Epilepsy, unspecified, not intractable, without status epilepticus: Secondary | ICD-10-CM | POA: Diagnosis not present

## 2020-07-17 DIAGNOSIS — I619 Nontraumatic intracerebral hemorrhage, unspecified: Secondary | ICD-10-CM | POA: Diagnosis not present

## 2020-07-17 DIAGNOSIS — N184 Chronic kidney disease, stage 4 (severe): Secondary | ICD-10-CM | POA: Diagnosis not present

## 2020-07-17 DIAGNOSIS — G8191 Hemiplegia, unspecified affecting right dominant side: Secondary | ICD-10-CM | POA: Diagnosis not present

## 2020-07-19 DIAGNOSIS — G40909 Epilepsy, unspecified, not intractable, without status epilepticus: Secondary | ICD-10-CM | POA: Diagnosis not present

## 2020-07-19 DIAGNOSIS — Z5329 Procedure and treatment not carried out because of patient's decision for other reasons: Secondary | ICD-10-CM | POA: Diagnosis not present

## 2020-07-24 DIAGNOSIS — G40909 Epilepsy, unspecified, not intractable, without status epilepticus: Secondary | ICD-10-CM | POA: Diagnosis not present

## 2020-07-24 DIAGNOSIS — I619 Nontraumatic intracerebral hemorrhage, unspecified: Secondary | ICD-10-CM | POA: Diagnosis not present

## 2020-07-24 DIAGNOSIS — I129 Hypertensive chronic kidney disease with stage 1 through stage 4 chronic kidney disease, or unspecified chronic kidney disease: Secondary | ICD-10-CM | POA: Diagnosis not present

## 2020-07-24 DIAGNOSIS — Z931 Gastrostomy status: Secondary | ICD-10-CM | POA: Diagnosis not present

## 2020-07-24 DIAGNOSIS — N184 Chronic kidney disease, stage 4 (severe): Secondary | ICD-10-CM | POA: Diagnosis not present

## 2020-07-24 DIAGNOSIS — G8191 Hemiplegia, unspecified affecting right dominant side: Secondary | ICD-10-CM | POA: Diagnosis not present

## 2020-07-24 DIAGNOSIS — L89610 Pressure ulcer of right heel, unstageable: Secondary | ICD-10-CM | POA: Diagnosis not present

## 2020-07-24 DIAGNOSIS — E43 Unspecified severe protein-calorie malnutrition: Secondary | ICD-10-CM | POA: Diagnosis not present

## 2020-07-28 DIAGNOSIS — G8191 Hemiplegia, unspecified affecting right dominant side: Secondary | ICD-10-CM | POA: Diagnosis not present

## 2020-07-28 DIAGNOSIS — L89610 Pressure ulcer of right heel, unstageable: Secondary | ICD-10-CM | POA: Diagnosis not present

## 2020-07-28 DIAGNOSIS — I619 Nontraumatic intracerebral hemorrhage, unspecified: Secondary | ICD-10-CM | POA: Diagnosis not present

## 2020-07-28 DIAGNOSIS — G40909 Epilepsy, unspecified, not intractable, without status epilepticus: Secondary | ICD-10-CM | POA: Diagnosis not present

## 2020-07-28 DIAGNOSIS — N184 Chronic kidney disease, stage 4 (severe): Secondary | ICD-10-CM | POA: Diagnosis not present

## 2020-07-28 DIAGNOSIS — E43 Unspecified severe protein-calorie malnutrition: Secondary | ICD-10-CM | POA: Diagnosis not present

## 2020-07-28 DIAGNOSIS — Z931 Gastrostomy status: Secondary | ICD-10-CM | POA: Diagnosis not present

## 2020-07-28 DIAGNOSIS — I129 Hypertensive chronic kidney disease with stage 1 through stage 4 chronic kidney disease, or unspecified chronic kidney disease: Secondary | ICD-10-CM | POA: Diagnosis not present

## 2020-08-07 DIAGNOSIS — L89610 Pressure ulcer of right heel, unstageable: Secondary | ICD-10-CM | POA: Diagnosis not present

## 2020-08-07 DIAGNOSIS — I129 Hypertensive chronic kidney disease with stage 1 through stage 4 chronic kidney disease, or unspecified chronic kidney disease: Secondary | ICD-10-CM | POA: Diagnosis not present

## 2020-08-07 DIAGNOSIS — N184 Chronic kidney disease, stage 4 (severe): Secondary | ICD-10-CM | POA: Diagnosis not present

## 2020-08-07 DIAGNOSIS — G40909 Epilepsy, unspecified, not intractable, without status epilepticus: Secondary | ICD-10-CM | POA: Diagnosis not present

## 2020-08-07 DIAGNOSIS — Z931 Gastrostomy status: Secondary | ICD-10-CM | POA: Diagnosis not present

## 2020-08-07 DIAGNOSIS — I619 Nontraumatic intracerebral hemorrhage, unspecified: Secondary | ICD-10-CM | POA: Diagnosis not present

## 2020-08-07 DIAGNOSIS — E43 Unspecified severe protein-calorie malnutrition: Secondary | ICD-10-CM | POA: Diagnosis not present

## 2020-08-07 DIAGNOSIS — G8191 Hemiplegia, unspecified affecting right dominant side: Secondary | ICD-10-CM | POA: Diagnosis not present

## 2020-08-25 ENCOUNTER — Inpatient Hospital Stay (HOSPITAL_COMMUNITY)
Admission: EM | Admit: 2020-08-25 | Discharge: 2020-09-02 | DRG: 100 | Disposition: A | Discharge status: Skilled Nursing Facility | Payer: Medicaid Other | Source: Skilled Nursing Facility | Attending: Family Medicine | Admitting: Family Medicine

## 2020-08-25 ENCOUNTER — Emergency Department (HOSPITAL_COMMUNITY): Payer: Medicaid Other

## 2020-08-25 DIAGNOSIS — I129 Hypertensive chronic kidney disease with stage 1 through stage 4 chronic kidney disease, or unspecified chronic kidney disease: Secondary | ICD-10-CM | POA: Diagnosis present

## 2020-08-25 DIAGNOSIS — G40901 Epilepsy, unspecified, not intractable, with status epilepticus: Secondary | ICD-10-CM | POA: Diagnosis not present

## 2020-08-25 DIAGNOSIS — E162 Hypoglycemia, unspecified: Secondary | ICD-10-CM | POA: Diagnosis not present

## 2020-08-25 DIAGNOSIS — I639 Cerebral infarction, unspecified: Secondary | ICD-10-CM

## 2020-08-25 DIAGNOSIS — I6783 Posterior reversible encephalopathy syndrome: Secondary | ICD-10-CM

## 2020-08-25 DIAGNOSIS — Z7401 Bed confinement status: Secondary | ICD-10-CM

## 2020-08-25 DIAGNOSIS — G8191 Hemiplegia, unspecified affecting right dominant side: Secondary | ICD-10-CM | POA: Diagnosis present

## 2020-08-25 DIAGNOSIS — J189 Pneumonia, unspecified organism: Secondary | ICD-10-CM | POA: Diagnosis present

## 2020-08-25 DIAGNOSIS — J9602 Acute respiratory failure with hypercapnia: Secondary | ICD-10-CM | POA: Diagnosis present

## 2020-08-25 DIAGNOSIS — E876 Hypokalemia: Secondary | ICD-10-CM | POA: Diagnosis not present

## 2020-08-25 DIAGNOSIS — R569 Unspecified convulsions: Secondary | ICD-10-CM

## 2020-08-25 DIAGNOSIS — E872 Acidosis: Secondary | ICD-10-CM | POA: Diagnosis present

## 2020-08-25 DIAGNOSIS — N179 Acute kidney failure, unspecified: Secondary | ICD-10-CM | POA: Diagnosis present

## 2020-08-25 DIAGNOSIS — J9601 Acute respiratory failure with hypoxia: Secondary | ICD-10-CM | POA: Diagnosis present

## 2020-08-25 DIAGNOSIS — N1831 Chronic kidney disease, stage 3a: Secondary | ICD-10-CM | POA: Diagnosis present

## 2020-08-25 DIAGNOSIS — Z87891 Personal history of nicotine dependence: Secondary | ICD-10-CM

## 2020-08-25 DIAGNOSIS — G9389 Other specified disorders of brain: Secondary | ICD-10-CM | POA: Diagnosis present

## 2020-08-25 DIAGNOSIS — Z79899 Other long term (current) drug therapy: Secondary | ICD-10-CM

## 2020-08-25 DIAGNOSIS — J939 Pneumothorax, unspecified: Secondary | ICD-10-CM

## 2020-08-25 DIAGNOSIS — I161 Hypertensive emergency: Secondary | ICD-10-CM | POA: Diagnosis present

## 2020-08-25 DIAGNOSIS — R0602 Shortness of breath: Secondary | ICD-10-CM

## 2020-08-25 DIAGNOSIS — I611 Nontraumatic intracerebral hemorrhage in hemisphere, cortical: Secondary | ICD-10-CM | POA: Diagnosis present

## 2020-08-25 DIAGNOSIS — Z9114 Patient's other noncompliance with medication regimen: Secondary | ICD-10-CM

## 2020-08-25 DIAGNOSIS — Z20822 Contact with and (suspected) exposure to covid-19: Secondary | ICD-10-CM | POA: Diagnosis present

## 2020-08-25 LAB — COMPREHENSIVE METABOLIC PANEL
ALT: 11 U/L (ref 0–44)
AST: 22 U/L (ref 15–41)
Albumin: 3.3 g/dL — ABNORMAL LOW (ref 3.5–5.0)
Alkaline Phosphatase: 115 U/L (ref 38–126)
Anion gap: 22 — ABNORMAL HIGH (ref 5–15)
BUN: 19 mg/dL (ref 6–20)
CO2: 19 mmol/L — ABNORMAL LOW (ref 22–32)
Calcium: 9.8 mg/dL (ref 8.9–10.3)
Chloride: 96 mmol/L — ABNORMAL LOW (ref 98–111)
Creatinine, Ser: 1.72 mg/dL — ABNORMAL HIGH (ref 0.61–1.24)
GFR calc Af Amer: 56 mL/min — ABNORMAL LOW (ref >60)
GFR calc non Af Amer: 49 mL/min — ABNORMAL LOW (ref >60)
Glucose, Bld: 205 mg/dL — ABNORMAL HIGH (ref 70–99)
Potassium: 3.3 mmol/L — ABNORMAL LOW (ref 3.5–5.1)
Sodium: 137 mmol/L (ref 135–145)
Total Bilirubin: 0.4 mg/dL (ref 0.3–1.2)
Total Protein: 8.2 g/dL — ABNORMAL HIGH (ref 6.5–8.1)

## 2020-08-25 LAB — CBC
HCT: 47.9 % (ref 39.0–52.0)
Hemoglobin: 14.4 g/dL (ref 13.0–17.0)
MCH: 25.4 pg — ABNORMAL LOW (ref 26.0–34.0)
MCHC: 30.1 g/dL (ref 30.0–36.0)
MCV: 84.5 fL (ref 80.0–100.0)
Platelets: 404 10*3/uL — ABNORMAL HIGH (ref 150–400)
RBC: 5.67 MIL/uL (ref 4.22–5.81)
RDW: 15.3 % (ref 11.5–15.5)
WBC: 17.2 10*3/uL — ABNORMAL HIGH (ref 4.0–10.5)
nRBC: 0 % (ref 0.0–0.2)

## 2020-08-25 LAB — SARS CORONAVIRUS 2 BY RT PCR (HOSPITAL ORDER, PERFORMED IN ~~LOC~~ HOSPITAL LAB): SARS Coronavirus 2: NEGATIVE

## 2020-08-25 LAB — I-STAT ARTERIAL BLOOD GAS, ED
Acid-Base Excess: 2 mmol/L (ref 0.0–2.0)
Bicarbonate: 31.6 mmol/L — ABNORMAL HIGH (ref 20.0–28.0)
Calcium, Ion: 1.32 mmol/L (ref 1.15–1.40)
HCT: 43 % (ref 39.0–52.0)
Hemoglobin: 14.6 g/dL (ref 13.0–17.0)
O2 Saturation: 93 %
Patient temperature: 98.6
Potassium: 3.4 mmol/L — ABNORMAL LOW (ref 3.5–5.1)
Sodium: 136 mmol/L (ref 135–145)
TCO2: 34 mmol/L — ABNORMAL HIGH (ref 22–32)
pCO2 arterial: 71.8 mmHg (ref 32.0–48.0)
pH, Arterial: 7.252 — ABNORMAL LOW (ref 7.350–7.450)
pO2, Arterial: 82 mmHg — ABNORMAL LOW (ref 83.0–108.0)

## 2020-08-25 LAB — CBG MONITORING, ED: Glucose-Capillary: 197 mg/dL — ABNORMAL HIGH (ref 70–99)

## 2020-08-25 LAB — MAGNESIUM: Magnesium: 2.1 mg/dL (ref 1.7–2.4)

## 2020-08-25 MED ORDER — CLEVIDIPINE BUTYRATE 0.5 MG/ML IV EMUL
0.0000 mg/h | INTRAVENOUS | Status: DC
  Administered 2020-08-25: 1 mg/h via INTRAVENOUS
  Filled 2020-08-25 (×2): qty 50

## 2020-08-25 MED ORDER — SODIUM CHLORIDE 0.9 % IV SOLN
750.0000 mg | Freq: Two times a day (BID) | INTRAVENOUS | Status: DC
  Administered 2020-08-26 (×2): 750 mg via INTRAVENOUS
  Filled 2020-08-25 (×7): qty 7.5

## 2020-08-25 MED ORDER — LABETALOL HCL 5 MG/ML IV SOLN
10.0000 mg | Freq: Once | INTRAVENOUS | Status: AC
  Administered 2020-08-25: 10 mg via INTRAVENOUS

## 2020-08-25 MED ORDER — PROPOFOL 1000 MG/100ML IV EMUL
INTRAVENOUS | Status: AC
  Administered 2020-08-25: 25 ug/kg/min
  Filled 2020-08-25: qty 100

## 2020-08-25 MED ORDER — SODIUM CHLORIDE 0.9 % IV SOLN
2000.0000 mg | Freq: Once | INTRAVENOUS | Status: AC
  Administered 2020-08-25: 2000 mg via INTRAVENOUS
  Filled 2020-08-25: qty 20

## 2020-08-25 MED ORDER — ETOMIDATE 2 MG/ML IV SOLN
INTRAVENOUS | Status: AC | PRN
  Administered 2020-08-25: 20 mg via INTRAVENOUS

## 2020-08-25 MED ORDER — ROCURONIUM BROMIDE 50 MG/5ML IV SOLN
INTRAVENOUS | Status: AC | PRN
  Administered 2020-08-25: 60 mg via INTRAVENOUS

## 2020-08-25 MED ORDER — LORAZEPAM 2 MG/ML IJ SOLN
1.0000 mg | Freq: Once | INTRAMUSCULAR | Status: AC
  Administered 2020-08-25: 1 mg via INTRAVENOUS
  Filled 2020-08-25: qty 1

## 2020-08-25 NOTE — ED Provider Notes (Signed)
[facility] EMERGENCY DEPARTMENT Provider Note   CSN: [id number] Arrival date & time: 08/25/20  1935     History Chief Complaint  Patient presents with  . Seizures  . Altered Mental Status    Joshua [name] is a 39 y.o. male.  He is a history of seizure disorder, hemorrhagic stroke requiring craniotomy, and is a long-term care facility.  Baseline per EMS is that he can point to things.  Per EMS staff noticed for seizures lasting minute at a time tonic-clonic with no return normal mental status in between.  EMS reported 1 observed seizure from them.  They gave him IM Versed.  Patient is not exhibiting any seizure activity on arrival but has a left gaze preference that does not sound like his baseline.  On a nonrebreather.  Level 5 caveat secondary to acuity of condition and patient nonverbal.  The history is provided by the EMS personnel. The history is limited by the condition of the patient.  Seizures Seizure activity on arrival: no   Seizure type:  Grand mal Initial focality:  Unable to specify Episode characteristics: generalized shaking   Return to baseline: no   Severity:  Unable to specify Number of seizures this episode:  5 Progression:  Unchanged PTA treatment:  Midazolam History of seizures: yes   Current therapy:  Levetiracetam Compliance with current therapy:  Poor Altered Mental Status Associated symptoms: seizures        No past medical history on file.  Patient Active Problem List   Diagnosis Date Noted  . Hypertensive urgency 07/05/2020  . Noncompliance with medication treatment due to intermittent use of medication 07/05/2020  . 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 PEG (percutaneous endoscopic gastrostomy) status ([place])   . Palliative care by specialist   . Goals of care, counseling/discussion   . Acute kidney injury ([place])   . Malignant hypertension 10/07/2019  . Severe protein-calorie malnutrition ([place]) 10/07/2019  . CKD (chronic  kidney disease), stage IV ([place]) 10/07/2019  . Seizure ([place]) 10/07/2019  . Acute respiratory failure with hypoxia ([place])   . Hemorrhagic stroke ([place])   . Cytotoxic brain edema ([place]) 09/10/2019  . ICH (intracerebral hemorrhage) ([place]) 09/09/2019  . Subdural hematoma ([place]) 09/09/2019  . Intracranial hemorrhage ([place]) 09/09/2019    Past Surgical History:  Procedure Laterality Date  . CRANIOTOMY Left 09/09/2019   Procedure: DECOMPRESSIVE CRANIECTOMY AND  HEMATOMA EVACUATION, IMPLANTATION OF SKULL FLAP IN RIGHT ABDOMEN;  Surgeon: Erline Levine, MD;  Location: [facility];  Service: Neurosurgery;  Laterality: Left;  . ESOPHAGOGASTRODUODENOSCOPY (EGD) WITH PROPOFOL N/A 09/28/2019   Procedure: ESOPHAGOGASTRODUODENOSCOPY (EGD) WITH PROPOFOL;  Surgeon: Georganna Skeans, MD;  Location: [facility] ENDOSCOPY;  Service: General;  Laterality: N/A;  . PEG PLACEMENT N/A 09/28/2019   Procedure: PERCUTANEOUS ENDOSCOPIC GASTROSTOMY (PEG) PLACEMENT;  Surgeon: Georganna Skeans, MD;  Location: [facility] ENDOSCOPY;  Service: General;  Laterality: N/A;       No family history on file.  Social History   Tobacco Use  . Smoking status: Former Research scientist (life sciences)  . Smokeless tobacco: Former Network engineer  . Vaping Use: Unknown  Substance Use Topics  . Alcohol use: Yes  . Drug use: Not Currently    Home Medications Prior to Admission medications   Medication Sig Start Date End Date Taking? Authorizing Provider  acetaminophen (TYLENOL) 325 MG tablet Place 2 tablets (650 mg total) into feeding tube every 4 (four) hours as needed for mild pain (temp > 100.5). Patient taking differently: Place  650 mg into feeding tube See admin instructions. 650 mg per tube two times a day and an additional 650 mg every four hours as needed for fever or pain- NOT TO EXCEED 4,000 MG/DAY FROM ALL COMBINED SOURCES 05/01/20   Barb Merino, MD  Amino Acids-Protein Hydrolys (FEEDING SUPPLEMENT, PRO-STAT SUGAR FREE 64,) LIQD Place 30 mLs into feeding tube 4 (four) times  daily. Patient not taking: Reported on 06/23/2020 05/01/20   Barb Merino, MD  amLODipine (NORVASC) 10 MG tablet Place 10 mg into feeding tube in the morning.    [provider]  baclofen (LIORESAL) 10 MG tablet Place 10 mg into feeding tube at bedtime.    [provider]  cloNIDine (CATAPRES - DOSED IN MG/24 HR) 0.3 mg/24hr patch Place 1 patch (0.3 mg total) onto the skin once a week. 07/10/20   Samella Parr, NP  levETIRAcetam (KEPPRA) 100 MG/ML solution Place 7.5 mLs (750 mg total) into feeding tube 2 (two) times daily. 07/04/20   Samella Parr, NP  magnesium hydroxide (MILK OF MAGNESIA) 400 MG/5ML suspension Place 30 mLs into feeding tube every 12 (twelve) hours as needed for mild constipation ("for 48 hours").    [provider]  metoprolol tartrate (LOPRESSOR) 25 MG tablet Place 1 tablet (25 mg total) into feeding tube 2 (two) times daily. 07/04/20   Samella Parr, NP  Multiple Vitamin (MULTIVITAMIN WITH MINERALS) TABS tablet Take 1 tablet by mouth daily. 07/05/20   Samella Parr, NP  NON FORMULARY Take by mouth See admin instructions. Magic Cup Frozen Desserts- One cup by mouth two times a day- with lunch and supper    [provider]  Nutritional Supplements (FEEDING SUPPLEMENT, JEVITY 1.2 CAL,) LIQD Place 474 mLs into feeding tube in the morning, at noon, in the evening, and at bedtime. Patient not taking: Reported on 06/23/2020 05/01/20   Barb Merino, MD  polyethylene glycol (MIRALAX / GLYCOLAX) 17 g packet Place 17 g into feeding tube 2 (two) times daily. Patient taking differently: Place 17 g into feeding tube See admin instructions. Mix 17 grams into 4-8 ounces of water and administer, per tube, two times a day 05/01/20   Barb Merino, MD  Water For Irrigation, Sterile (FREE WATER) SOLN Place 200 mLs into feeding tube every 4 (four) hours. Patient taking differently: Place 50-250 mLs into feeding tube See admin instructions. 50 ml's before and  after each feeding and an additional 250 ml's every 6 hours for hydration 05/01/20   Barb Merino, MD    Allergies    Patient has no known allergies.  Review of Systems   Review of Systems  Unable to perform ROS: Mental status change  Neurological: Positive for seizures.    Physical Exam Updated Vital Signs BP (!) 196/140 (BP Location: Right Arm)   Pulse (!) 140   Temp 98.9 F (37.2 C) (Axillary)   Resp (!) 32   SpO2 96%   Physical Exam Vitals and nursing note reviewed.  Constitutional:      Appearance: He is well-developed. He is cachectic. He is ill-appearing.  HENT:     Head: Normocephalic and atraumatic.     Comments: Patient has skeletal abnormality left temporal area question status post [place]. Eyes:     Conjunctiva/sclera: Conjunctivae normal.  Cardiovascular:     Rate and Rhythm: Regular rhythm. Tachycardia present.     Heart sounds: No murmur heard.   Pulmonary:     Effort: Pulmonary effort is normal. No respiratory  distress.     Breath sounds: Normal breath sounds.  Abdominal:     Palpations: Abdomen is soft.     Tenderness: There is no abdominal tenderness.     Comments: Metallic foreign body right lower quadrant question bladder stimulator.  Musculoskeletal:        General: No deformity.     Cervical back: Neck supple.  Skin:    General: Skin is warm and dry.     Capillary Refill: Capillary refill takes less than 2 seconds.  Neurological:     Comments: Patient has left gaze preference, not following any commands, breathing spontaneously, no withdraw to pain.  No blink to threat.     ED Results / Procedures / Treatments   Labs (all labs ordered are listed, but only abnormal results are displayed) Labs Reviewed  COMPREHENSIVE METABOLIC PANEL - Abnormal; Notable for the following components:      Result Value   Potassium 3.3 (*)    Chloride 96 (*)    CO2 19 (*)    Glucose, Bld 205 (*)    Creatinine, Ser 1.72 (*)    Total Protein 8.2 (*)     Albumin 3.3 (*)    GFR calc non Af Amer 49 (*)    GFR calc Af Amer 56 (*)    Anion gap 22 (*)    All other components within normal limits  CBC - Abnormal; Notable for the following components:   WBC 17.2 (*)    MCH 25.4 (*)    Platelets 404 (*)    All other components within normal limits  CBG MONITORING, ED - Abnormal; Notable for the following components:   Glucose-Capillary 197 (*)    All other components within normal limits  SARS CORONAVIRUS 2 BY RT PCR (HOSPITAL ORDER, [facility])  MAGNESIUM  LEVETIRACETAM LEVEL    EKG EKG Interpretation  Date/Time:  Sunday August 25 2020 20:16:14 EDT Ventricular Rate:  109 PR Interval:    QRS Duration: 75 QT Interval:  326 QTC Calculation: 439 R Axis:   81 Text Interpretation: Sinus tachycardia Atrial premature complex LAE, consider biatrial enlargement Borderline repolarization abnormality Confirmed by Aletta Edouard [phone]) on 08/25/2020 8:18:56 PM   Radiology CT HEAD WO CONTRAST  Result Date: 08/25/2020 CLINICAL DATA:  Initial evaluation for acute seizure. EXAM: CT HEAD WITHOUT CONTRAST TECHNIQUE: Contiguous axial images were obtained from the base of the skull through the vertex without intravenous contrast. COMPARISON:  Comparison made with prior head CT from 06/23/2020. FINDINGS: Brain: Postoperative changes from prior left craniectomy for intraparenchymal hematoma evacuation again seen. Extensive encephalomalacia again seen throughout the left cerebral hemisphere, with brain parenchyma protruding through the craniectomy defect, worsened from prior exam. Associated ex vacuo dilatation of the left lateral ventricle. Small chronic subdural hygromas at the level of the craniectomy defect measure up to 6 mm, similar to previous (series 5, image 40). Additionally, there is a 1.6 cm focus of intraparenchymal hyperdensity at the anterior margin of the herniated left frontal cortex, most consistent with hemorrhage. A  similar hemorrhage with seen at this location on 06/23/2020, although this is increased in size as compared to prior. Given the degree of hyperdensity, this is favored to reflect a new hemorrhage from prior. In addition, there is increased hypodensity seen involving the left thalamus extending into the left greater than right midbrain and brainstem. Increased hypodensity extends into the cerebellum bilaterally. Increased confluent hypodensity seen throughout the white matter of the right centrum  semi ovale as well. Findings are nonspecific, and could reflect changes related to seizure. Possible PRES could also be considered. Additionally, there is question of subtle parenchymal hypodensity involving the posterior right parietal cortex with subtle blurring of the gray-white matter differentiation (., Nonspecific, but could reflect changes related to seizure or ischemia No other acute intracranial hemorrhage. No other definite large vessel territory infarct. No mass lesion or other extra-axial fluid collection. No significant midline shift. Vascular: No asymmetric hyperdense vessel. Intracranial circulation diffusely dolichoectatic in appearance. Skull: Prior left hemi craniectomy. No visible scalp soft tissue injury. Sinuses/Orbits: Globes and orbital soft tissues demonstrate no acute finding. Mild scattered mucosal thickening noted within the ethmoidal air cells. Nasal trumpet in place. Mastoid air cells are clear. Other: None. IMPRESSION: 1. Postoperative changes from prior left hemi craniectomy for intraparenchymal hematoma evacuation. Extensive encephalomalacia throughout the left cerebral hemisphere, with brain parenchyma protruding through the craniectomy defect, worsened from prior. 2. 1.6 cm focus of intraparenchymal hyperdensity at the anterior margin of the herniated left frontal cortex, new/increased in size from previous. 3. Increased hypodensity involving the left thalamus, left greater than right  midbrain, brainstem, and cerebellum, with additional confluent and somewhat worsened hypodensity throughout the white matter of the right centrum semi ovale. Findings are nonspecific, and could be related to seizure/status epilepticus, although the diffuse nature of these findings would be somewhat unusual for this entity. Possible PRES could also be considered. Nonspecific inflammation/encephalitis could also be considered in the correct clinical setting. 4. Question subtle parenchymal hypodensity involving the right parietal cortex. Changes related to seizure and/or PRES favored, although possible ischemia could also be considered. Critical Value/emergent results were called by telephone at the time of interpretation on 08/25/2020 at 8:49 pm to provider Damacio Weisgerber as well as Dr. Rory Percy, Who verbally acknowledged these results. Electronically Signed   By: Jeannine Boga M.D.   On: 08/25/2020 21:10   DG Chest Port 1 View  Result Date: 08/25/2020 CLINICAL DATA:  Shortness of breath. EXAM: PORTABLE CHEST 1 VIEW COMPARISON:  Radiograph 06/27/2020 FINDINGS: Improved opacities in the right hemithorax from prior exam. Mild residual at the right lung base. Left lung is clear. Stable heart size and mediastinal contours. No pleural effusion or pneumothorax. No pulmonary edema. No acute osseous abnormalities are seen. IMPRESSION: Improved opacities in the right hemithorax from July 1st exam. Mild residual atelectasis at the right lung base. Electronically Signed   By: Keith Rake M.D.   On: 08/25/2020 20:28    Procedures .Critical Care Performed by: Hayden Rasmussen, MD Authorized by: Hayden Rasmussen, MD   Critical care provider statement:    Critical care time (minutes):  60   Critical care time was exclusive of:  Separately billable procedures and treating other patients   Critical care was necessary to treat or prevent imminent or life-threatening deterioration of the following conditions:   CNS failure or compromise   Critical care was time spent personally by me on the following activities:  Discussions with consultants, evaluation of patient's response to treatment, examination of patient, ordering and performing treatments and interventions, ordering and review of laboratory studies, ordering and review of radiographic studies, pulse oximetry, re-evaluation of patient's condition, obtaining history from patient or surrogate and review of old charts   I assumed direction of critical care for this patient from another provider in my specialty: no     (including critical care time)  Medications Ordered in ED Medications  LORazepam (ATIVAN) injection 1 mg (1  mg Intravenous Given 08/25/20 1951)  levETIRAcetam (KEPPRA) 2,000 mg in sodium chloride 0.9 % 250 mL IVPB (0 mg Intravenous Stopped 08/25/20 2011)  labetalol (NORMODYNE) injection 10 mg (10 mg Intravenous Given 08/25/20 1956)  labetalol (NORMODYNE) injection 10 mg (10 mg Intravenous Given 08/25/20 2044)    ED Course  I have reviewed the triage vital signs and the nursing notes.  Pertinent labs & imaging results that were available during my care of the patient were reviewed by me and considered in my medical decision making (see chart for details).  Clinical Course as of Aug 25 2205  Sun Aug 25, 2020  1944 I have paged Dr. Rory Percy neurology prior to the patient's arrival with concerns that he may need aggressive treatment.  He was present on the patient's arrival.  He is helping with care.   [MB]  1952 Ordered patient to see receive IV Ativan and IV Keppra load.   [MB]  1953 Dr. Rory Percy was able to reach family members and they would wish the patient to be full code.   [MB]  2013 Reassessment-no gross findings on the patient's stat head CT.  Currently does not have a left gaze preference now.  Tachycardia and hypertension somewhat improved after 10 of labetalol.  Awaiting further neurology recommendations.  Will need to be  admitted to the hospital for further management.   [MB]  2024 Patient's head CT interpreted by me is markedly abnormal with missing calvarium on the left side and asymmetry of brain tissue.  I do not appreciate any acute bleed but will wait on neuroradiology.   [MB]  2024 Portable chest x-ray interpreted by me as showing some asymmetry in the lung markings, widened mediastinum question technique.  Awaiting radiology reading.   [MB]  2036 Discussed with Dr. Sedonia Small ED physician in [facility] pod who will take over for care for him when I leave.  Plan is to follow-up on EEG and neurology recommendations.   [MB]    Clinical Course User Index [MB] Hayden Rasmussen, MD   MDM Rules/Calculators/A&P                         This patient complains of multiple seizures without return to normal mental status; this involves an extensive number of treatment Options and is a complaint that carries with it a high risk of complications and Morbidity. The differential includes seizure, bleed, press, stroke, status epilepticus, subtherapeutic seizure medications, infection  I ordered, reviewed and interpreted labs, which included CBC with elevated white count, stable hemoglobin, chemistries with mildly low potassium elevated glucose, elevated creatinine but at baseline, negative Covid, normal magnesium I ordered medication IV Ativan and IV Keppra load, IV labetalol for hypertension I ordered imaging studies which included chest x-ray and CT head and I independently    visualized and interpreted imaging which showed no acute infiltrates, abnormal CT head with evidence of craniotomy.  Need radiology assistance. Additional history obtained from EMS Previous records obtained and reviewed in epic, prolonged hospital admission for intercranial hemorrhage requiring neurosurgical procedures I consulted neurology Dr. Rory Percy and discussed lab and imaging findings  Critical Interventions: Management of hypertensive emergency  and possible status epilepticus with IV medications and frequent reassessments  After the interventions stated above, I reevaluated the patient and found patient's blood pressure to be somewhat improved and improvement in tachycardia.  Left eye deviation has resolved although possibly still having subclinical status.  Getting set up for EEG and  will need admission to the hospital for further management.   Final Clinical Impression(s) / ED Diagnoses Final diagnoses:  SOB (shortness of breath)  Status epilepticus [facility])    Rx / DC Orders ED Discharge Orders    None       Hayden Rasmussen, MD 08/25/20 2220

## 2020-08-25 NOTE — ED Triage Notes (Addendum)
EMS arrival from [place] post status seizure. Per EMS pt had 3 witnessed grand mal seizures remaining unresponsive GCS 3, Lsided gaze, clinched jaw. Per EMS staff reports pt refused Keppra x3 days. Per EMS pt baseline deficits from past CVA, bedbound, non verbal, able to understand commands, points to respond.  EMS gave 5mg  IV versed, placed NPA R nare.

## 2020-08-25 NOTE — ED Provider Notes (Signed)
  [facility] EMERGENCY DEPARTMENT Procedure Note  Procedure Name: Intubation Date/Time: 08/25/2020 11:12 PM Performed by: Renold Genta, MD Pre-anesthesia Checklist: Patient identified, Emergency Drugs available, Suction available, Patient being monitored and Timeout performed Oxygen Delivery Method: Non-rebreather mask Preoxygenation: Pre-oxygenation with 100% oxygen Induction Type: Rapid sequence Ventilation: Mask ventilation without difficulty Laryngoscope Size: Mac and 4 Grade View: Grade II Nasal Tubes: Right Tube size: 7.5 mm Number of attempts: 1 Placement Confirmation: ETT inserted through vocal cords under direct vision,  Positive ETCO2,  CO2 detector and Breath sounds checked- equal and bilateral Secured at: 25 cm Tube secured with: ETT holder Dental Injury: Teeth and Oropharynx as per pre-operative assessment  Difficulty Due To: Difficult Airway- due to anterior larynx      Labs, studies and imaging reviewed by myself and considered in medical decision making if ordered. Imaging interpreted by radiology. Pt was discussed with my attending, Dr. Sedonia Small  Electronically signed by:  Roderic Palau [place]8/29/202111:13 PM       Renold Genta, MD 08/25/20 2313    Maudie Flakes, MD 08/26/20 [phone]

## 2020-08-25 NOTE — Progress Notes (Signed)
Same day follow-up  Preliminary EEG negative for seizures. Has MRI safe EEG leads in place. Can get MRI and then resume back on LTM EEG. Reevaluated by ED physician-unable to protect his airway-requires intubation for airway protection. Will be admitted to the ICU.  Based on the EEG, Currently not in status epilepticus but I will continue LTM EEG.  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  Also has leukocytosis, which could simply be marginalization in the setting of seizures but -look for source of infection-would first work-up for systemic infection and if no causes found, would look for any evidence of CNS infection.    Also an MRI with and without contrast would be helpful in showing any abnormal meningeal enhancement which might point towards a CNS infection.  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 -Work-up for leukocytosis per ED and primary team. -As far as his blood pressure goal is concerned, the left frontal bleed is bizarre-it was present in June and continues to remain present, I am not sure if that truly is new bleed or expansion of the bleed or just signal changes from that bleeding that-that said, his systolic blood pressures were in the 200s on presentation.  If the suspicion is high for posterior reversible encephalopathy syndrome, I would try to reduce his blood pressures and treated like hypertensive emergency bringing blood pressures down by  20% every day.  So far the next 12 or so hours, the goal blood pressure should be less than 180.  He got 1 dose of labetalol IV and his systolic blood pressures came down to 160s with a heart rate down to 100 from 140s.  Cleviprex can be used to control blood pressures if needed.  Plan discussed with Dr. Sedonia Small in the ER as well as Dr. Gillermina Phy, PCCM over the phone.   -- Amie Portland, MD [facility] Pager: [phone] If 7pm to 7am, please call on call as listed on AMION.   CC TIME ATTESTATION Additional 30 minutes critical care time spent to treat imminent life-threatening condition/deterioration due to breakthrough seizure, concern for ICH, hypertensive emergency, posterior reversible encephalopathy syndrome.  Patient is critically ill and at significant risk for neurological worsening and/or death and requires constant monitoring.  Critical care time was spent on development of treatment plan, discussion with other consultants, review of imaging, review of diagnostic data including EEG, review of laboratory studies.

## 2020-08-25 NOTE — ED Notes (Signed)
EEG in progress at this time.

## 2020-08-25 NOTE — ED Notes (Signed)
MRI to be contacted per end of EEG.

## 2020-08-25 NOTE — Progress Notes (Signed)
LTM started. MRI electrodes glued, no skin break down noted. Tested pt event button. Nursing staff educated regarding pt event button as well as disconnect and reconnect for MRI.

## 2020-08-25 NOTE — ED Notes (Signed)
Portable Xray at bedside.

## 2020-08-25 NOTE — Procedures (Signed)
[name]  MRN: [id number]  Epilepsy Attending: Lora Havens  Referring Physician/Provider: Dr Amie Portland Date: 8/29/201 Duration: 27.14 mins  Patient history: 39 year old male with history of seizure and prior left ICH s/p hemi-crani who presented after multiple breakthrough seizures. EEG to evaluate for seizure  Level of alertness: lethargic  AEDs during EEG study: Ativan, lev  Technical aspects: This EEG study was done with scalp electrodes positioned according to the 10-20 International system of electrode placement. Electrical activity was acquired at a sampling rate of 500Hz  and reviewed with a high frequency filter of 70Hz  and a low frequency filter of 1Hz . EEG data were recorded continuously and digitally stored.   Description:  EEG showed continuous generalized and lateralized left hemisphere 3 to 6 Hz theta-delta slowing admixed with generalized 15-18Hz  beta activity. Hyperventilation and photic stimulation were not performed.     Of note, this study was technically difficult due to significant movement and electrode artifact  ABNORMALITY -Excessive beta, generalized -Continuous slow, generalized, lateralized left hemisphere  IMPRESSION: This technically difficult study is suggestive of cortical dysfunction in left hemisphere due to underlying encephalomalacia, post ictal state. Additionally, there is evidence of severe diffuse encephalopathy, nonspecific etiology but likely related to sedation. No seizures or epileptiform discharges were seen throughout the recording.  [name]

## 2020-08-25 NOTE — ED Notes (Signed)
Pt transported to CT and returned In stable condition. Family made aware of pt status at this time.

## 2020-08-25 NOTE — ED Notes (Signed)
Portable Xray at beside

## 2020-08-25 NOTE — ED Provider Notes (Signed)
  Provider Note MRN:  [id number]  Arrival date & time: 08/25/20    ED Course and Medical Decision Making  Assumed care from Dr. Melina Copa at shift change.  Patient continues to appear ill, undergoing EEG monitoring.  Neurology considering subclinical status versus press.  Patient on nonrebreather oxygen but unclear as to his true oxygen requirement.  I was able to transition him down to 4 L nasal cannula and maintain saturations in the mid 90s.  I discussed admission with both the hospital stepdown unit and the intensivist.  ABG was obtained for further clarification, revealing acidosis and hypercarbia.  Given patient's mental status, poor candidate for BiPAP.  Given this and his need for continued care and need for travel to MRI, patient was intubated for hypercarbic respiratory failure and airway protection.  See separate note by Dr. Lin Landsman for details.  Admitted to intensivist service for further care.  .Critical Care Performed by: Maudie Flakes, MD Authorized by: Maudie Flakes, MD   Critical care provider statement:    Critical care time (minutes):  45   Critical care was necessary to treat or prevent imminent or life-threatening deterioration of the following conditions:  CNS failure or compromise   Critical care was time spent personally by me on the following activities:  Discussions with consultants, evaluation of patient's response to treatment, examination of patient, ordering and performing treatments and interventions, ordering and review of laboratory studies, ordering and review of radiographic studies, pulse oximetry, re-evaluation of patient's condition, obtaining history from patient or surrogate and review of old charts   I assumed direction of critical care for this patient from another provider in my specialty: yes      Final Clinical Impressions(s) / ED Diagnoses     ICD-10-CM   1. Status epilepticus ([place])  G40.901   2. SOB (shortness of breath)  R06.02 DG Chest [facility] 1  View    DG Chest [place] 1 View  3. 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 Acute respiratory failure with hypoxia and hypercapnia ([facility])  J96.01    J96.02     ED Discharge Orders    None      Discharge Instructions   None     Barth Kirks. Sedonia Small, [place] [email or web address]    Maudie Flakes, MD 08/25/20 [phone]

## 2020-08-25 NOTE — Consult Note (Signed)
Neurology Consultation  Reason for Consult:, Status epilepticus Referring Physician: Dr. Aletta Edouard, EDP  CC: Seizure, status epilepticus  History is obtained from: EMS, mother over the phone  HPI: Joshua [name] is a 39 y.o. male significant past medical history for seizures and an intracranial hematoma in September 2020, he had a hemicraniectomy and had an extended upwards of 200-day stay in the hospital with a eventual discharge in May 2021, returned back again in June with seizures, was in his nursing facility, where EMS was called today for multiple seizures. Presumably he had upwards of 5 seizures without return to baseline. At baseline, he is able to track people around and is awake, although nonverbal with right hemiplegia but during and after the seizures he was very different from his baseline and that is why EMS was called. He was given Versed and route and brought in as an emergent evaluation. I was informed by the ED doctor of his arrival so that I could examine him, in case he needed an emergent intubation. I examined the patient in the emergency room.  I called his mother.  The mother had seen the patient week before last.  She said that he was able to track them in the room and did seem like he would comprehend some of the things that they said but was nonverbal and was not able to move the right side. Of note, EMS also reports that the facility said that the patient had been refusing his antiepileptics and probably did not get Keppra over the last few days. No other history is available at this time.   LKW: Unknown and unclear tpa given?: no, seizures, unclear last normal Premorbid modified Rankin scale (mRS): 5 ICH SCORE - 2 for GCS   ROS: Unable to obtain due to altered mental status  No past medical history on file. Past medical history Seizures, ICH, prior left hemicraniectomy  No family history on file. No family history of stroke  Social History:   reports  that he has quit smoking. He has quit using smokeless tobacco. He reports current alcohol use. He reports previous drug use.   Medications No current facility-administered medications for this encounter.  Current Outpatient Medications:    acetaminophen (TYLENOL) 325 MG tablet, Place 2 tablets (650 mg total) into feeding tube every 4 (four) hours as needed for mild pain (temp > 100.5). (Patient taking differently: Place 650 mg into feeding tube See admin instructions. 650 mg per tube two times a day and an additional 650 mg every four hours as needed for fever or pain- NOT TO EXCEED 4,000 MG/DAY FROM ALL COMBINED SOURCES), Disp:  , Rfl:    Amino Acids-Protein Hydrolys (FEEDING SUPPLEMENT, PRO-STAT SUGAR FREE 64,) LIQD, Place 30 mLs into feeding tube 4 (four) times daily. (Patient not taking: Reported on 06/23/2020), Disp: 887 mL, Rfl: 0   amLODipine (NORVASC) 10 MG tablet, Place 10 mg into feeding tube in the morning., Disp: , Rfl:    baclofen (LIORESAL) 10 MG tablet, Place 10 mg into feeding tube at bedtime., Disp: , Rfl:    cloNIDine (CATAPRES - DOSED IN MG/24 HR) 0.3 mg/24hr patch, Place 1 patch (0.3 mg total) onto the skin once a week., Disp: 4 patch, Rfl: 12   levETIRAcetam (KEPPRA) 100 MG/ML solution, Place 7.5 mLs (750 mg total) into feeding tube 2 (two) times daily., Disp: 473 mL, Rfl: 12   magnesium hydroxide (MILK OF MAGNESIA) 400 MG/5ML suspension, Place 30 mLs into feeding tube every 12 (  twelve) hours as needed for mild constipation ("for 48 hours")., Disp: , Rfl:    metoprolol tartrate (LOPRESSOR) 25 MG tablet, Place 1 tablet (25 mg total) into feeding tube 2 (two) times daily., Disp: , Rfl:    Multiple Vitamin (MULTIVITAMIN WITH MINERALS) TABS tablet, Take 1 tablet by mouth daily., Disp: , Rfl:    NON FORMULARY, Take by mouth See admin instructions. Magic Cup Frozen Desserts- One cup by mouth two times a day- with lunch and supper, Disp: , Rfl:    Nutritional Supplements  (FEEDING SUPPLEMENT, JEVITY 1.2 CAL,) LIQD, Place 474 mLs into feeding tube in the morning, at noon, in the evening, and at bedtime. (Patient not taking: Reported on 06/23/2020), Disp: , Rfl: 0   polyethylene glycol (MIRALAX / GLYCOLAX) 17 g packet, Place 17 g into feeding tube 2 (two) times daily. (Patient taking differently: Place 17 g into feeding tube See admin instructions. Mix 17 grams into 4-8 ounces of water and administer, per tube, two times a day), Disp: 14 each, Rfl: 0   Water For Irrigation, Sterile (FREE WATER) SOLN, Place 200 mLs into feeding tube every 4 (four) hours. (Patient taking differently: Place 50-250 mLs into feeding tube See admin instructions. 50 ml's before and after each feeding and an additional 250 ml's every 6 hours for hydration), Disp:  , Rfl:    Exam: Current vital signs: BP (!) 169/138    Pulse (!) 110    Temp 98.9 F (37.2 C) (Axillary)    Resp (!) 26    SpO2 95%  Vital signs in last 24 hours: Temp:  [98.9 F (37.2 C)] 98.9 F (37.2 C) (08/29 1937) Pulse Rate:  [110-140] 110 (08/29 2000) Resp:  [26-32] 26 (08/29 2000) BP: (169-196)/(138-140) 169/138 (08/29 2000) SpO2:  [95 %-96 %] 95 % (08/29 2000) General: Unresponsive with eyes open, no apparent distress, no generalized shaking seen. HEENT: Normocephalic atraumatic, cachectic looking man CVS: Regular rhythm Respiratory: Breathing well saturating normally on room air, for now protecting his airway Extremities, extremely cachectic Neurological exam Unresponsive with eyes open, no response to voice or noxious stimulation Forced gaze deviation to the left. A few minutes after, the gaze became midline. Pupils are equal round reactive to light No spontaneous movement No movement to noxious immolation Flaccid all 4 extremities Corneal reflexes present   Labs I have reviewed labs in epic and the results pertinent to this consultation are:  CBC    Component Value Date/Time   WBC 17.2 (H)  08/25/2020 1946   RBC 5.67 08/25/2020 1946   HGB 14.4 08/25/2020 1946   HCT 47.9 08/25/2020 1946   PLT 404 (H) 08/25/2020 1946   MCV 84.5 08/25/2020 1946   MCH 25.4 (L) 08/25/2020 1946   MCHC 30.1 08/25/2020 1946   RDW 15.3 08/25/2020 1946   LYMPHSABS 2.9 04/25/2020 0733   MONOABS 0.6 04/25/2020 0733   EOSABS 0.3 04/25/2020 0733   BASOSABS 0.0 04/25/2020 0733    CMP     Component Value Date/Time   NA 139 07/01/2020 0910   K 3.8 07/01/2020 0910   CL 105 07/01/2020 0910   CO2 22 07/01/2020 0910   GLUCOSE 117 (H) 07/01/2020 0910   BUN 33 (H) 07/01/2020 0910   CREATININE 1.87 (H) 07/01/2020 0910   CALCIUM 9.6 07/01/2020 0910   PROT 6.7 06/23/2020 2110   ALBUMIN 2.6 (L) 06/25/2020 0316   AST 20 06/23/2020 2110   ALT 9 06/23/2020 2110   ALKPHOS 103 06/23/2020 2110  BILITOT 0.4 06/23/2020 2110   GFRNONAA 44 (L) 07/01/2020 0910   GFRAA 51 (L) 07/01/2020 0910    Lipid Panel     Component Value Date/Time   CHOL 134 09/16/2019 0613   TRIG 156 (H) 06/23/2020 2110   HDL 28 (L) 09/16/2019 0613   CHOLHDL 4.8 09/16/2019 0613   VLDL 20 09/16/2019 0613   LDLCALC 86 09/16/2019 0613     Imaging I have reviewed the images obtained: Stat CT head showing large area of encephalomalacia in the left hemisphere with missing bone flap, with a small hyperintensity in the anterior frontal lobe which was present also in June but is probably increased or new given the time that has passed since last scan. Other concerning findings is hypodensity in left thalamus and worsened white matter disease in the right hemisphere-which could be reflective of sequela of ongoing hypertension or posterior reversible encephalopathy syndrome.  Hard to say on this CAT scan.  FORMAL CT READ IMPRESSION: 1. Postoperative changes from prior left hemi craniectomy for intraparenchymal hematoma evacuation. Extensive encephalomalacia throughout the left cerebral hemisphere, with brain parenchyma protruding through  the craniectomy defect, worsened from prior. 2. 1.6 cm focus of intraparenchymal hyperdensity at the anterior margin of the herniated left frontal cortex, new/increased in size from previous. 3. Increased hypodensity involving the left thalamus, left greater than right midbrain, brainstem, and cerebellum, with additional confluent and somewhat worsened hypodensity throughout the white matter of the right centrum semi ovale. Findings are nonspecific, and could be related to seizure/status epilepticus, although the diffuse nature of these findings would be somewhat unusual for this entity. Possible PRES could also be considered. Nonspecific inflammation/encephalitis could also be considered in the correct clinical setting. 4. Question subtle parenchymal hypodensity involving the right parietal cortex. Changes related to seizure and/or PRES favored, although possible ischemia could also be considered.  Assessment: 39 year old man with a large ICH requiring hemicraniectomy last year followed by development of seizures, prolonged hospital stay last year of upwards of 200 days, finally discharged to a facility in May 2021, returning again in June with breakthrough seizures and discharged back to the facility, returns again with breakthrough seizures and possible status epilepticus. Also extremely hypertensive on arrival Examination revealed unresponsiveness with left gaze forced deviation. Likely in status epilepticus CT scan with a small anterior frontal bleed-similar location to the prior but may be either bigger in size or new.  Other concerning finding is multiple hypodensities including the left thalamus, left cerebral white matter and right cerebral white matter concerning for posterior reversible encephalopathy syndrome.  Infectious causes could also be entertained as differentials. An MRI would give a better picture of what this might be.  Impression: -Status epilepticus and breakthrough  seizure in patient with known seizure secondary to large ICH and hemicraniectomy -Hypertensive emergency-evaluate for posterior reversible encephalopathy syndrome -Noncompliance to medications -Due to leukocytosis, infectious process cannot be ruled out completely.  Recommendations: Ativan 1 mg IV x1 Load with Keppra 2 g IV x1 Continue Keppra 750 twice daily-home dose Stat EEG-technologist notified and on her way. Likely would require video EEG monitoring Spoke with the mother-she would like full scope of care and intubation if needed for seizure control with heavy sedation. Urinalysis and chest x-ray And infectious etiology cannot be ruled out but I would first look for systemic infections prior to chasing work-up for CNS infection at this time.   Further management recommendations after EEG.  Plan discussed with Dr. Melina Copa -- Amie Portland, MD [facility]  Pager: [phone] If 7pm to 7am, please call on call as listed on AMION.   CRITICAL CARE ATTESTATION Performed by: Amie Portland, MD Total critical care time: 61 minutes Critical care time was exclusive of separately billable procedures and treating other patients and/or supervising APPs/Residents/Students Critical care was necessary to treat or prevent imminent or life-threatening deterioration due to status epilepticus, breakthrough seizures, prior ICH This patient is critically ill and at significant risk for neurological worsening and/or death and care requires constant monitoring. Critical care was time spent personally by me on the following activities: development of treatment plan with patient and/or surrogate as well as nursing, discussions with consultants, evaluation of patient's response to treatment, examination of patient, obtaining history from patient or surrogate, ordering and performing treatments and interventions, ordering and review of laboratory studies, ordering and review of radiographic studies,  pulse oximetry, re-evaluation of patient's condition, participation in multidisciplinary rounds and medical decision making of high complexity in the care of this patient.

## 2020-08-25 NOTE — Progress Notes (Signed)
Stat EEG completed. Pt about to be intubated, Initiating LTM after intubation.  MRI electrodes applied per MD. Pt to go to MRI later.

## 2020-08-26 ENCOUNTER — Inpatient Hospital Stay (HOSPITAL_COMMUNITY): Payer: Medicaid Other

## 2020-08-26 ENCOUNTER — Emergency Department (HOSPITAL_COMMUNITY): Payer: Medicaid Other

## 2020-08-26 DIAGNOSIS — G9389 Other specified disorders of brain: Secondary | ICD-10-CM | POA: Diagnosis present

## 2020-08-26 DIAGNOSIS — N1831 Chronic kidney disease, stage 3a: Secondary | ICD-10-CM | POA: Diagnosis present

## 2020-08-26 DIAGNOSIS — Z20822 Contact with and (suspected) exposure to covid-19: Secondary | ICD-10-CM | POA: Diagnosis present

## 2020-08-26 DIAGNOSIS — E872 Acidosis: Secondary | ICD-10-CM | POA: Diagnosis present

## 2020-08-26 DIAGNOSIS — J9602 Acute respiratory failure with hypercapnia: Secondary | ICD-10-CM | POA: Diagnosis present

## 2020-08-26 DIAGNOSIS — G40901 Epilepsy, unspecified, not intractable, with status epilepticus: Secondary | ICD-10-CM | POA: Diagnosis present

## 2020-08-26 DIAGNOSIS — Z9114 Patient's other noncompliance with medication regimen: Secondary | ICD-10-CM | POA: Diagnosis not present

## 2020-08-26 DIAGNOSIS — R569 Unspecified convulsions: Secondary | ICD-10-CM | POA: Diagnosis not present

## 2020-08-26 DIAGNOSIS — G8191 Hemiplegia, unspecified affecting right dominant side: Secondary | ICD-10-CM | POA: Diagnosis present

## 2020-08-26 DIAGNOSIS — E876 Hypokalemia: Secondary | ICD-10-CM | POA: Diagnosis not present

## 2020-08-26 DIAGNOSIS — I129 Hypertensive chronic kidney disease with stage 1 through stage 4 chronic kidney disease, or unspecified chronic kidney disease: Secondary | ICD-10-CM | POA: Diagnosis present

## 2020-08-26 DIAGNOSIS — Z7401 Bed confinement status: Secondary | ICD-10-CM | POA: Diagnosis not present

## 2020-08-26 DIAGNOSIS — J189 Pneumonia, unspecified organism: Secondary | ICD-10-CM | POA: Diagnosis present

## 2020-08-26 DIAGNOSIS — I161 Hypertensive emergency: Secondary | ICD-10-CM | POA: Diagnosis present

## 2020-08-26 DIAGNOSIS — Z79899 Other long term (current) drug therapy: Secondary | ICD-10-CM | POA: Diagnosis not present

## 2020-08-26 DIAGNOSIS — J9601 Acute respiratory failure with hypoxia: Secondary | ICD-10-CM

## 2020-08-26 DIAGNOSIS — E162 Hypoglycemia, unspecified: Secondary | ICD-10-CM | POA: Diagnosis not present

## 2020-08-26 DIAGNOSIS — I6783 Posterior reversible encephalopathy syndrome: Secondary | ICD-10-CM

## 2020-08-26 DIAGNOSIS — N179 Acute kidney failure, unspecified: Secondary | ICD-10-CM | POA: Diagnosis present

## 2020-08-26 DIAGNOSIS — I611 Nontraumatic intracerebral hemorrhage in hemisphere, cortical: Secondary | ICD-10-CM | POA: Diagnosis present

## 2020-08-26 DIAGNOSIS — Z87891 Personal history of nicotine dependence: Secondary | ICD-10-CM | POA: Diagnosis not present

## 2020-08-26 LAB — URINALYSIS, ROUTINE W REFLEX MICROSCOPIC
Bilirubin Urine: NEGATIVE
Glucose, UA: 50 mg/dL — AB
Ketones, ur: NEGATIVE mg/dL
Leukocytes,Ua: NEGATIVE
Nitrite: NEGATIVE
Protein, ur: >=300 mg/dL — AB
Specific Gravity, Urine: 1.014 (ref 1.005–1.030)
pH: 6 (ref 5.0–8.0)

## 2020-08-26 LAB — CBC
HCT: 49 % (ref 39.0–52.0)
Hemoglobin: 15.3 g/dL (ref 13.0–17.0)
MCH: 25.1 pg — ABNORMAL LOW (ref 26.0–34.0)
MCHC: 31.2 g/dL (ref 30.0–36.0)
MCV: 80.5 fL (ref 80.0–100.0)
Platelets: 351 10*3/uL (ref 150–400)
RBC: 6.09 MIL/uL — ABNORMAL HIGH (ref 4.22–5.81)
RDW: 15.3 % (ref 11.5–15.5)
WBC: 12.9 10*3/uL — ABNORMAL HIGH (ref 4.0–10.5)
nRBC: 0 % (ref 0.0–0.2)

## 2020-08-26 LAB — I-STAT ARTERIAL BLOOD GAS, ED
Acid-Base Excess: 5 mmol/L — ABNORMAL HIGH (ref 0.0–2.0)
Bicarbonate: 27.4 mmol/L (ref 20.0–28.0)
Calcium, Ion: 1.25 mmol/L (ref 1.15–1.40)
HCT: 49 % (ref 39.0–52.0)
Hemoglobin: 16.7 g/dL (ref 13.0–17.0)
O2 Saturation: 92 %
Patient temperature: 99
Potassium: 3.7 mmol/L (ref 3.5–5.1)
Sodium: 139 mmol/L (ref 135–145)
TCO2: 28 mmol/L (ref 22–32)
pCO2 arterial: 33.4 mmHg (ref 32.0–48.0)
pH, Arterial: 7.523 — ABNORMAL HIGH (ref 7.350–7.450)
pO2, Arterial: 57 mmHg — ABNORMAL LOW (ref 83.0–108.0)

## 2020-08-26 LAB — PHOSPHORUS: Phosphorus: 2.8 mg/dL (ref 2.5–4.6)

## 2020-08-26 LAB — GLUCOSE, CAPILLARY
Glucose-Capillary: 157 mg/dL — ABNORMAL HIGH (ref 70–99)
Glucose-Capillary: 168 mg/dL — ABNORMAL HIGH (ref 70–99)
Glucose-Capillary: 130 mg/dL — ABNORMAL HIGH (ref 70–99)
Glucose-Capillary: 131 mg/dL — ABNORMAL HIGH (ref 70–99)

## 2020-08-26 LAB — BASIC METABOLIC PANEL
Anion gap: 13 (ref 5–15)
BUN: 20 mg/dL (ref 6–20)
CO2: 25 mmol/L (ref 22–32)
Calcium: 9.8 mg/dL (ref 8.9–10.3)
Chloride: 98 mmol/L (ref 98–111)
Creatinine, Ser: 1.6 mg/dL — ABNORMAL HIGH (ref 0.61–1.24)
GFR calc Af Amer: >60 mL/min (ref >60)
GFR calc non Af Amer: 53 mL/min — ABNORMAL LOW (ref >60)
Glucose, Bld: 129 mg/dL — ABNORMAL HIGH (ref 70–99)
Potassium: 3.3 mmol/L — ABNORMAL LOW (ref 3.5–5.1)
Sodium: 136 mmol/L (ref 135–145)

## 2020-08-26 LAB — HEMOGLOBIN A1C
Hgb A1c MFr Bld: 5.9 % — ABNORMAL HIGH (ref 4.8–5.6)
Mean Plasma Glucose: 122.63 mg/dL

## 2020-08-26 LAB — MAGNESIUM: Magnesium: 2 mg/dL (ref 1.7–2.4)

## 2020-08-26 LAB — LACTIC ACID, PLASMA: Lactic Acid, Venous: 1.7 mmol/L (ref 0.5–1.9)

## 2020-08-26 LAB — MRSA PCR SCREENING: MRSA by PCR: NEGATIVE

## 2020-08-26 MED ORDER — INSULIN ASPART 100 UNIT/ML ~~LOC~~ SOLN
0.0000 [IU] | SUBCUTANEOUS | Status: DC
  Administered 2020-08-26: 2 [IU] via SUBCUTANEOUS
  Administered 2020-08-26: 3 [IU] via SUBCUTANEOUS
  Administered 2020-08-26 – 2020-09-02 (×14): 2 [IU] via SUBCUTANEOUS

## 2020-08-26 MED ORDER — DOCUSATE SODIUM 100 MG PO CAPS: 100.0000 mg | ORAL_CAPSULE | Freq: Two times a day (BID) | ORAL | Status: DC | PRN

## 2020-08-26 MED ORDER — CHLORHEXIDINE GLUCONATE CLOTH 2 % EX PADS
6.0000 | MEDICATED_PAD | Freq: Every day | CUTANEOUS | Status: DC
  Administered 2020-08-27 – 2020-09-02 (×7): 6 via TOPICAL

## 2020-08-26 MED ORDER — POLYETHYLENE GLYCOL 3350 17 G PO PACK: 17.0000 g | PACK | Freq: Every day | ORAL | Status: DC | PRN

## 2020-08-26 MED ORDER — LACTATED RINGERS IV BOLUS
500.0000 mL | Freq: Once | INTRAVENOUS | Status: AC
  Administered 2020-08-26: 500 mL via INTRAVENOUS

## 2020-08-26 MED ORDER — FENTANYL 2500MCG IN NS 250ML (10MCG/ML) PREMIX INFUSION
50.0000 ug/h | INTRAVENOUS | Status: DC
  Administered 2020-08-26: 50 ug/h via INTRAVENOUS
  Filled 2020-08-26: qty 250

## 2020-08-26 MED ORDER — VANCOMYCIN HCL 1250 MG/250ML IV SOLN
1250.0000 mg | INTRAVENOUS | Status: DC
  Filled 2020-08-26: qty 250

## 2020-08-26 MED ORDER — PANTOPRAZOLE SODIUM 40 MG IV SOLR
40.0000 mg | Freq: Every day | INTRAVENOUS | Status: DC
  Administered 2020-08-26 (×2): 40 mg via INTRAVENOUS
  Filled 2020-08-26 (×2): qty 40

## 2020-08-26 MED ORDER — PIPERACILLIN-TAZOBACTAM 3.375 G IVPB
3.3750 g | Freq: Three times a day (TID) | INTRAVENOUS | Status: DC
  Administered 2020-08-26 – 2020-08-28 (×7): 3.375 g via INTRAVENOUS
  Filled 2020-08-26 (×7): qty 50

## 2020-08-26 MED ORDER — GADOBUTROL 1 MMOL/ML IV SOLN
6.0000 mL | Freq: Once | INTRAVENOUS | Status: AC | PRN
  Administered 2020-08-26: 6 mL via INTRAVENOUS

## 2020-08-26 MED ORDER — FENTANYL CITRATE (PF) 100 MCG/2ML IJ SOLN: 50.0000 ug | INTRAMUSCULAR | Status: DC | PRN

## 2020-08-26 MED ORDER — FENTANYL BOLUS VIA INFUSION
50.0000 ug | INTRAVENOUS | Status: DC | PRN
  Filled 2020-08-26: qty 50

## 2020-08-26 MED ORDER — FENTANYL CITRATE (PF) 100 MCG/2ML IJ SOLN
50.0000 ug | Freq: Once | INTRAMUSCULAR | Status: AC
  Administered 2020-08-26: 50 ug via INTRAVENOUS
  Filled 2020-08-26: qty 2

## 2020-08-26 MED ORDER — POTASSIUM CHLORIDE 20 MEQ PO PACK
40.0000 meq | PACK | Freq: Once | ORAL | Status: AC
  Administered 2020-08-26: 40 meq
  Filled 2020-08-26: qty 2

## 2020-08-26 MED ORDER — METOPROLOL TARTRATE 5 MG/5ML IV SOLN
5.0000 mg | Freq: Once | INTRAVENOUS | Status: AC
  Administered 2020-08-26: 5 mg via INTRAVENOUS
  Filled 2020-08-26: qty 5

## 2020-08-26 MED ORDER — CHLORHEXIDINE GLUCONATE 0.12% ORAL RINSE (MEDLINE KIT)
15.0000 mL | Freq: Two times a day (BID) | OROMUCOSAL | Status: DC
  Administered 2020-08-26 – 2020-08-28 (×5): 15 mL via OROMUCOSAL

## 2020-08-26 MED ORDER — ORAL CARE MOUTH RINSE
15.0000 mL | OROMUCOSAL | Status: DC
  Administered 2020-08-26 – 2020-08-28 (×21): 15 mL via OROMUCOSAL

## 2020-08-26 MED ORDER — FENTANYL CITRATE (PF) 100 MCG/2ML IJ SOLN
50.0000 ug | INTRAMUSCULAR | Status: DC | PRN
  Administered 2020-08-26: 100 ug via INTRAVENOUS
  Filled 2020-08-26: qty 2

## 2020-08-26 NOTE — Procedures (Addendum)
[name]  MRN: [id number]  Epilepsy Attending: Lora Havens  Referring Physician/Provider: Dr Amie Portland Duration:  08/25/2020 2307 to 08/26/2020 2307  Patient history: 39 year old male with history of seizure and prior left ICH s/p hemi-crani who presented after multiple breakthrough seizures. EEG to evaluate for seizure  Level of alertness: lethargic  AEDs during EEG study: Ativan, lev  Technical aspects: This EEG study was done with scalp electrodes positioned according to the 10-20 International system of electrode placement. Electrical activity was acquired at a sampling rate of 500Hz  and reviewed with a high frequency filter of 70Hz  and a low frequency filter of 1Hz . EEG data were recorded continuously and digitally stored.   Description:  EEG showed continuous generalized and maximal bifrontal, rhythmic 2 to 3 Hz delta slowing.  Generalized and lateralized left hemisphere periodic epileptiform discharges  at 1 Hz were also noted initially in the study. Hyperventilation and photic stimulation were not performed.     Of note, parts of this study was technically difficult due to significant movement and electrode artifact  ABNORMALITY -Periodic epileptiform discharges, generalized and lateralized left hemisphere -Continuous rhythmic delta slow, generalized  IMPRESSION: This technically difficult study is suggestive of generalized epileptogenicity, seen predominantly in the left hemisphere likely secondary to underlying encephalomalacia.  Additionally, there is evidence of moderate to  severe diffuse encephalopathy, nonspecific etiology.  No seizures were seen during the study.  Clatie Kessen Barbra Sarks

## 2020-08-26 NOTE — Progress Notes (Signed)
MRI brain with without contrast completed Patchy multifocal T2 and flair signal abnormality involving the left thalamus, brainstem, cerebellum, right cerebral white matter-most consistent with a posterior reversible encephalopathy syndrome. Difficult exclude infections but no abnormal enhancement. Gyriform diffusion abnormality in the right parietotemporal cortex and mesial right temporal lobe most consistent with seizure edema. Postoperative changes from prior left craniotomy for IPH with extensive underlying encephalomalacia within the left cerebral hemisphere with protrusion of brain parenchyma through the craniotomy defect and associated scattered superficial siderosis. 1.6 cm intraparenchymal hemorrhage at the anterior margin of the herniated left cortex-stable from prior CT. Multiple scattered chronic microhemorrhages most prominent about the deep gray nuclei and brainstem most suggestive of chronic poorly controlled hypertension.  Review of the MRI felt to be most consistent with posterior reversible encephalopathy syndrome. No need for emergent spinal tap as infection is felt less likely given the overall clinical picture including that of chronic microhemorrhages suggesting poorly controlled hypertension and presentation with extreme hypertension and seizures.  Neurology will continue to follow with you.  -- Amie Portland, MD [facility] Pager: [phone] If 7pm to 7am, please call on call as listed on AMION.

## 2020-08-26 NOTE — ED Notes (Signed)
Full linen change, brief, and incontinence care provided. Foley inserted with provider approval.

## 2020-08-26 NOTE — ED Notes (Signed)
Pt HR still elevated and pt is tense and biting down on ETT and OG. Given fentanyl

## 2020-08-26 NOTE — H&P (Addendum)
NAME:  Joshua [name], MRN:  [id number], DOB:  01-03-1981, LOS: 0 ADMISSION DATE:  08/25/2020, CONSULTATION DATE:  08/26/20 REFERRING MD:  EDP CHIEF COMPLAINT:  seizures  Brief History   39 y.o. 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  At baseline he is nonverbal, though is awake and tracks with his eyes.  He was noted to have around 5 breakthrough seizures today at his facility without return to baseline so EMS was called.  There is mention he may have been refusing his Keppra.  He was given Versed in route.  In the ED, CT head with small anterior frontal bleed, possibly larger than previous with multiple hypodensities in the left thalamus, left cerebral and right cerebral white matter concerning for possible PRES. As he was obtunded with hypercarbia, he was intubated for airway protection.  LTM EEG initiated without initial seizure activity.  He was started on Cleviprex for blood pressure control and MRI ordered.  Labs significant for leukocytosis without clear source of infection.  PCCM consulted for admission  Past Medical History  Seizure disorder, [place] [facility]   8/30 Admit to PCCM  Consults:  Neurology  Procedures:  8/29 ETT  Significant Diagnostic Tests:  8/29 CT head>>1.6 cm focus of intraparenchymal hyperdensity , Increased hypodensity involving the left thalamus, midbrain, brainstem, and cerebellum. Findings are nonspecific, and could be related to seizure/status epilepticus, although the diffuse nature of these findings would be  somewhat unusual for thisentity. Possible PRES could also be considered 8/29 CXR>>Interval development of right lower lobe consolidation, favor atelectasis.  Micro Data:  8/29 Sars-CoV-2>>negative  Antimicrobials:    Interim history/subjective:  Pt started on Cleviprex for HTN  Objective   Blood pressure (!) 201/149, pulse (!) 114, temperature 98.9 F (37.2 C), temperature source Axillary, resp. rate (!) 23, height 6' (1.829 m), SpO2 97 %.    Vent Mode: PRVC FiO2 (%):  [100 %] 100 % Set Rate:  [16 bmp] 16 bmp Vt Set:  [620 mL] 620 mL PEEP:  [5 cmH20] 5 cmH20 Plateau Pressure:  [20 cmH20] 20 cmH20   Intake/Output Summary (Last 24 hours) at 08/26/2020 0026 Last data filed at 08/25/2020 2011 Gross per 24 hour  Intake 250 ml  Output --  Net 250 ml   There were no vitals filed for this visit.  General:  Thin, chronically ill-appearing M, unresponsive  HEENT: MM pink/moist, ETT in place Neuro: unresponsive on propofol, no seizure activity or myoclonus, PERRLA   CV: s1s2 rrr, no m/r/g PULM:  Decreased air movement bilateral bases, some rhonchi RLL GI: soft, bsx4 active Extremities: warm/dry, thin, no  edema  Skin: no rashes or lesions   Resolved Hospital Problem list     Assessment & Plan:   Seizures with acute on chronic encephalopathy and possible PRES/ Hypertensive Emergency Appreciate Neurology recommendations -Received Ativan and loaded with Keppra, continue 750mg  bid -MRI to further evaluate L frontal increased hyperdensity and scattered hypodensities  -continue LTM -Neuro checks and seizure precautions -Cleviprex infusion started for HTN, goal for next 12 hrs is SBP <180 with 20%  reduction every day -Propofol for sedation  Hypercapnic  Respiratory Failure Prior ABG's without CO2 retention, possibly related to encephalopathy and seizure activity -Maintain full vent support with SAT/SBT as tolerated -titrate Vent setting to maintain SpO2 greater than or equal  to 90%. -HOB elevated 30 degrees. -Plateau pressures less than 30 cm H20.  -Follow chest x-ray, ABG prn.   -Bronchial hygiene and RT/bronchodilator protocol. -repeat ABG   Leukocytosis  Afebrile in the ED, likely atelectasis on CXR and UA is pending, may be reactive in the setting of seizure activity -Follow UA and obtain cultures, hold antibiotics unless clinical picture more clearly points to infectious process   AGMA  CO2 19 with Gap 22 -check lactic acid and follow   CKD -Creatinine 1.7 near baseline, avoid nephrotoxins as able, monitor UOP and electrolytes  Best practice:  Diet: NPO Pain/Anxiety/Delirium protocol (if indicated): Propofol/Fentanyl VAP protocol (if indicated): HOB 30 degrees, suction prn DVT prophylaxis: SCD's GI prophylaxis: protonix Glucose control: SSI Mobility: bed rest Code Status: full code Family Communication: updated by Neurology Disposition: ICU  Labs   CBC: Recent Labs  Lab 08/25/20 1946 08/25/20 2240  WBC 17.2*  --   HGB 14.4 14.6  HCT 47.9 43.0  MCV 84.5  --   PLT 404*  --     Basic Metabolic Panel: Recent Labs  Lab 08/25/20 1946 08/25/20 2240  NA 137 136  K 3.3* 3.4*  CL 96*  --   CO2 19*  --   GLUCOSE 205*  --   BUN 19  --   CREATININE 1.72*  --   CALCIUM 9.8  --   MG 2.1  --    GFR: CrCl cannot be calculated (Unknown ideal weight.). Recent Labs  Lab 08/25/20 1946  WBC 17.2*    Liver Function Tests: Recent Labs  Lab 08/25/20 1946  AST 22  ALT 11  ALKPHOS 115  BILITOT 0.4  PROT 8.2*  ALBUMIN 3.3*   No results for input(s): LIPASE, AMYLASE in the last 168 hours. No results for input(s): AMMONIA in the last 168 hours.  ABG    Component Value Date/Time   PHART 7.252 (L) 08/25/2020 2240   PCO2ART 71.8 (HH) 08/25/2020 2240   PO2ART 82 (L) 08/25/2020 2240   HCO3 31.6 (H) 08/25/2020 2240   TCO2 34 (H) 08/25/2020 2240   ACIDBASEDEF 6.0 (H) 06/24/2020 0347   O2SAT 93.0 08/25/2020 2240      Coagulation Profile: No results for input(s): INR, PROTIME in the last 168 hours.  Cardiac Enzymes: No results for input(s): CKTOTAL, CKMB, CKMBINDEX, TROPONINI in the last 168 hours.  HbA1C: Hgb A1c MFr Bld  Date/Time Value Ref Range Status  06/24/2020 12:48 AM 6.0 (H) 4.8 - 5.6 % Final    Comment:    (NOTE) Pre diabetes:          5.7%-6.4%  Diabetes:              >6.4%  Glycemic control for   <7.0% adults with diabetes   09/10/2019 12:56 AM 5.6 4.8 - 5.6 % Final    Comment:    (NOTE)         Prediabetes: 5.7 - 6.4         Diabetes: >6.4         Glycemic control for adults with diabetes: <7.0     CBG: Recent Labs  Lab 08/25/20 1938  GLUCAP 197*    Review of Systems:   Unable to obtain secondary to  mental status  Past Medical History  He,  has no past medical history on file.   Surgical History    Past Surgical History:  Procedure Laterality Date  . CRANIOTOMY Left 09/09/2019   Procedure: DECOMPRESSIVE CRANIECTOMY AND  HEMATOMA EVACUATION, IMPLANTATION OF SKULL FLAP IN RIGHT ABDOMEN;  Surgeon: Erline Levine, MD;  Location: [place];  Service: Neurosurgery;  Laterality: Left;  . ESOPHAGOGASTRODUODENOSCOPY (EGD) WITH PROPOFOL N/A 09/28/2019   Procedure: ESOPHAGOGASTRODUODENOSCOPY (EGD) WITH PROPOFOL;  Surgeon: Georganna Skeans, MD;  Location: [facility] ENDOSCOPY;  Service: General;  Laterality: N/A;  . PEG PLACEMENT N/A 09/28/2019   Procedure: PERCUTANEOUS ENDOSCOPIC GASTROSTOMY (PEG) PLACEMENT;  Surgeon: Georganna Skeans, MD;  Location: [place];  Service: General;  Laterality: N/A;     Social History   reports that he has quit smoking. He has quit using smokeless tobacco. He reports current alcohol use. He reports previous drug use.   Family History   His family history is not on file.   Allergies No Known Allergies   Home Medications  Prior to Admission medications   Medication Sig Start Date End Date Taking? Authorizing Provider  acetaminophen (TYLENOL) 325  MG tablet Place 2 tablets (650 mg total) into feeding tube every 4 (four) hours as needed for mild pain (temp > 100.5). Patient taking differently: Place 650 mg into feeding tube See admin instructions. 650 mg per tube two times a day and an additional 650 mg every four hours as needed for fever or pain- NOT TO EXCEED 4,000 MG/DAY FROM ALL COMBINED SOURCES 05/01/20  Yes Barb Merino, MD  amLODipine (NORVASC) 10 MG tablet Place 10 mg into feeding tube in the morning.   Yes [provider]  baclofen (LIORESAL) 10 MG tablet Place 10 mg into feeding tube at bedtime.   Yes [provider]  cloNIDine (CATAPRES - DOSED IN MG/24 HR) 0.3 mg/24hr patch Place 1 patch (0.3 mg total) onto the skin once a week. 07/10/20  Yes Samella Parr, NP  levETIRAcetam (KEPPRA) 100 MG/ML solution Place 7.5 mLs (750 mg total) into feeding tube 2 (two) times daily. 07/04/20  Yes Samella Parr, NP  LORazepam (ATIVAN) 2 MG/ML concentrated solution Take 1 mg by mouth daily as needed for seizure.   Yes [provider]  magnesium hydroxide (MILK OF MAGNESIA) 400 MG/5ML suspension Place 30 mLs into feeding tube every 12 (twelve) hours as needed for mild constipation ("for 48 hours").   Yes [provider]  Multiple Vitamin (MULTIVITAMIN WITH MINERALS) TABS tablet Take 1 tablet by mouth daily. 07/05/20  Yes Samella Parr, NP  NON FORMULARY Take by mouth See admin instructions. Magic Cup Frozen Desserts- One cup by mouth two times a day- with lunch and supper   Yes [provider]  polyethylene glycol (MIRALAX / GLYCOLAX) 17 g packet Place 17 g into feeding tube 2 (two) times daily. Patient taking differently: Place 17 g into feeding tube See admin instructions. Mix 17 grams into 4-8 ounces of water and administer, per tube, two times a day 05/01/20  Yes Ghimire, Dante Gang, MD  Amino Acids-Protein Hydrolys (FEEDING SUPPLEMENT, PRO-STAT SUGAR FREE 64,) LIQD Place 30 mLs into feeding tube 4 (four)  times daily. Patient not taking: Reported on 06/23/2020 05/01/20   Barb Merino, MD  metoprolol tartrate (LOPRESSOR) 25 MG tablet Place 1 tablet (25 mg total) into feeding tube 2 (two) times daily. Patient not taking: Reported on 08/25/2020 07/04/20   Samella Parr, NP  Nutritional Supplements (FEEDING  SUPPLEMENT, JEVITY 1.2 CAL,) LIQD Place 474 mLs into feeding tube in the morning, at noon, in the evening, and at bedtime. Patient not taking: Reported on 06/23/2020 05/01/20   Barb Merino, MD  Water For Irrigation, Sterile (FREE WATER) SOLN Place 200 mLs into feeding tube every 4 (four) hours. Patient taking differently: Place 50-250 mLs into feeding tube See admin instructions. 50 ml's before and after each feeding and an additional 250 ml's every 6 hours for hydration 05/01/20   Barb Merino, MD     Critical care time: 50 minutes    CRITICAL CARE Performed by: Otilio Carpen Gleason   Total critical care time: 50 minutes  Critical care time was exclusive of separately billable procedures and treating other patients.  Critical care was necessary to treat or prevent imminent or life-threatening deterioration.  Critical care was time spent personally by me on the following activities: development of treatment plan with patient and/or surrogate as well as nursing, discussions with consultants, evaluation of patient's response to treatment, examination of patient, obtaining history from patient or surrogate, ordering and performing treatments and interventions, ordering and review of laboratory studies, ordering and review of radiographic studies, pulse oximetry and re-evaluation of patient's condition.   Otilio Carpen Gleason, PA-C  Attending MD note  Patient was independently seen and examined, treatment plan was discussed with the  Advance Practice Provider. I agree with the above note by PA Gleason. I have personally reviewed the clinical findings, labs, ECG, imaging studies and management of this  patient in detail. I have also reviewed the orders written for this patient which were under my direction. I agree with the documentation, as recorded by the Advance Practice Provider.   Briefly, Joshua [name] is a 39 y.o. male with a history of of Seizures, ICH, baseline non verbal, awake and tracks.  Now is here with worsening AMS, seizures today.   Thought to have PRES.   Intubated for airway protection in ED, also hypercarbic.   Developed worsening oxygen requirements, increasing HR.  B infiltriates on cxr and [place].   Subjective: Intubated sedated,   Objective: Vitals:   08/26/20 0700 08/26/20 0730  BP: (!) 120/96 (!) 118/105  Pulse: (!) 149 (!) 146  Resp: 15 12  Temp:    SpO2: 96% 97%    FiO2 (%):  [30 %] 30 % No intake or output data in the 24 hours ending 10/26/18 2358   General:  Sedated, non responsive,  Neuro:  Non responsive, heavily sedated on fentanyl and propofol HEENT:  [place]/AT, No JVD noted, PERRL Cardiovascular:  RRR, no MRG Lungs:  Crackles B, symmetric BS bilaterally Abdomen:  Soft, non-distended Musculoskeletal:  No acute deformity Skin:  Intact, MMM  CXR images Deep sulcus sign?  Not confirmed PTX by [place] lung.   B infiltrates developing/effusions.   Impression/Plan: Encephalopathy: possible PRES.  ICH also noted but old.  Cont BP control   Decrease BP, Increase HR, Increased temp increased o2 req:  Possible aspiration PNA.   Will add abtx.  Cultures pending.  Fluid bolus pending.  Trial of small dose of metop for tachycardia.   My cc time 45  minutes  This patient is critically ill, requiring high complexity decision making for assessment and plan, frequent evaluation, application of advanced monitoring and extensive interpretations of multiple databases.    Critical Care time devoted to patient care services described in this note is 45 minutes, not including time spent on procedures, teaching or supervising.  Collier Bullock, MD

## 2020-08-26 NOTE — ED Notes (Signed)
EEG monitoring was removed for MRI

## 2020-08-26 NOTE — ED Notes (Signed)
Pt tx by this RN and RT to MRI. Pt placed on table for imaging.

## 2020-08-26 NOTE — Progress Notes (Signed)
NEUROLOGY CONSULTATION NOTE   Date of service: August 26, 2020 [name] MRN:  [id number] DOB:  08/02/1981  Interval Hx   Discussed with patient's mom and staff at [facility] where he resides. He is bed bound at baseline, can move his LUE and occasionally say a word of 2 and understand basic commands. Has difficulty with making complex decision.  He refuses medications through Peg tube and can get violent towards staff. Staff did tell me that he will only agree to taking medications about 3 days out of a week. They would call his family and they would sometimes be able to convince him to take his Keppra and blood pressure medications.  Currently he is being weaned off fentanyl.   Past History  No past medical history on file. Past Surgical History:  Procedure Laterality Date  . CRANIOTOMY Left 09/09/2019   Procedure: DECOMPRESSIVE CRANIECTOMY AND  HEMATOMA EVACUATION, IMPLANTATION OF SKULL FLAP IN RIGHT ABDOMEN;  Surgeon: Erline Levine, MD;  Location: [place];  Service: Neurosurgery;  Laterality: Left;  . ESOPHAGOGASTRODUODENOSCOPY (EGD) WITH PROPOFOL N/A 09/28/2019   Procedure: ESOPHAGOGASTRODUODENOSCOPY (EGD) WITH PROPOFOL;  Surgeon: Georganna Skeans, MD;  Location: [facility] ENDOSCOPY;  Service: General;  Laterality: N/A;  . PEG PLACEMENT N/A 09/28/2019   Procedure: PERCUTANEOUS ENDOSCOPIC GASTROSTOMY (PEG) PLACEMENT;  Surgeon: Georganna Skeans, MD;  Location: [facility] ENDOSCOPY;  Service: General;  Laterality: N/A;   No family history on file. Social History   Socioeconomic History  . Marital status: Single    Spouse name: Not on file  . Number of children: Not on file  . Years of education: Not on file  . Highest education level: Not on file  Occupational History  . Not on file  Tobacco Use  . Smoking status: Former Research scientist (life sciences)  . Smokeless tobacco: Former Network engineer  . Vaping Use: Unknown  Substance and Sexual Activity  . Alcohol use: Yes  . Drug use: Not  Currently  . Sexual activity: Not Currently  Other Topics Concern  . Not on file  Social History Narrative  . Not on file   Social Determinants of Health   Financial Resource Strain: Unknown  . Difficulty of Paying Living Expenses: Patient refused  Food Insecurity: Unknown  . Worried About Charity fundraiser in the Last Year: Patient refused  . Ran Out of Food in the Last Year: Patient refused  Transportation Needs: Unknown  . Lack of Transportation (Medical): Patient refused  . Lack of Transportation (Non-Medical): Patient refused  Physical Activity: Unknown  . Days of Exercise per Week: Patient refused  . Minutes of Exercise per Session: Patient refused  Stress: Unknown  . Feeling of Stress : Patient refused  Social Connections: Unknown  . Frequency of Communication with Friends and Family: Patient refused  . Frequency of Social Gatherings with Friends and Family: Patient refused  . Attends Religious Services: Patient refused  . Active Member of Clubs or Organizations: Patient refused  . Attends Archivist Meetings: Patient refused  . Marital Status: Patient refused   No Known Allergies  Medications   Medications Prior to Admission  Medication Sig Dispense Refill Last Dose  . acetaminophen (TYLENOL) 325 MG tablet Place 2 tablets (650 mg total) into feeding tube every 4 (four) hours as needed for mild pain (temp > 100.5). (Patient taking differently: Place 650 mg into feeding tube See admin instructions. 650 mg per tube two times a day and an additional 650 mg every  four hours as needed for fever or pain- NOT TO EXCEED 4,000 MG/DAY FROM ALL COMBINED SOURCES)   unkn  . amLODipine (NORVASC) 10 MG tablet Place 10 mg into feeding tube in the morning.   08/25/2020 at Unknown time  . baclofen (LIORESAL) 10 MG tablet Place 10 mg into feeding tube at bedtime.   08/24/2020 at Unknown time  . cloNIDine (CATAPRES - DOSED IN MG/24 HR) 0.3 mg/24hr patch Place 1 patch (0.3 mg  total) onto the skin once a week. 4 patch 12 08/21/2020  . levETIRAcetam (KEPPRA) 100 MG/ML solution Place 7.5 mLs (750 mg total) into feeding tube 2 (two) times daily. 473 mL 12 08/25/2020 at Unknown time  . LORazepam (ATIVAN) 2 MG/ML concentrated solution Take 1 mg by mouth daily as needed for seizure.     . magnesium hydroxide (MILK OF MAGNESIA) 400 MG/5ML suspension Place 30 mLs into feeding tube every 12 (twelve) hours as needed for mild constipation ("for 48 hours").   unkn  . Multiple Vitamin (MULTIVITAMIN WITH MINERALS) TABS tablet Take 1 tablet by mouth daily.   08/25/2020 at Unknown time  . NON FORMULARY Take by mouth See admin instructions. Magic Cup Frozen Desserts- One cup by mouth two times a day- with lunch and supper   08/25/2020 at Unknown time  . polyethylene glycol (MIRALAX / GLYCOLAX) 17 g packet Place 17 g into feeding tube 2 (two) times daily. (Patient taking differently: Place 17 g into feeding tube See admin instructions. Mix 17 grams into 4-8 ounces of water and administer, per tube, two times a day) 14 each 0 08/25/2020 at Unknown time  . Amino Acids-Protein Hydrolys (FEEDING SUPPLEMENT, PRO-STAT SUGAR FREE 64,) LIQD Place 30 mLs into feeding tube 4 (four) times daily. (Patient not taking: Reported on 06/23/2020) 887 mL 0 Not Taking at Unknown time  . metoprolol tartrate (LOPRESSOR) 25 MG tablet Place 1 tablet (25 mg total) into feeding tube 2 (two) times daily. (Patient not taking: Reported on 08/25/2020)   Not Taking at Unknown time  . Nutritional Supplements (FEEDING SUPPLEMENT, JEVITY 1.2 CAL,) LIQD Place 474 mLs into feeding tube in the morning, at noon, in the evening, and at bedtime. (Patient not taking: Reported on 06/23/2020)  0   . Water For Irrigation, Sterile (FREE WATER) SOLN Place 200 mLs into feeding tube every 4 (four) hours. (Patient taking differently: Place 50-250 mLs into feeding tube See admin instructions. 50 ml's before and after each feeding and an additional 250  ml's every 6 hours for hydration)        Vitals  Temp:  [98.5 F (36.9 C)-99.9 F (37.7 C)] 98.5 F (36.9 C) (08/30 1200) Pulse Rate:  [58-160] 133 (08/30 1300) Resp:  [12-32] 12 (08/30 1300) BP: (107-213)/(77-169) 133/114 (08/30 1300) SpO2:  [94 %-99 %] 98 % (08/30 1300) Arterial Line BP: (107-123)/(88-94) 107/88 (08/30 0900) FiO2 (%):  [60 %-100 %] 60 % (08/30 0859) Weight:  [57.2 kg] 57.2 kg (08/30 0400)  Body mass index is 17.1 kg/m.  Physical Exam   General: Laying comfortably in bed; intubated and in no acute distress. HENT: Normal oropharynx and mucosa. Normal external appearance of ears and nose. Neck: Supple, no pain or tenderness CV: No JVD. No peripheral edema. Pulmonary: Symmetric Chest rise. Normal respiratory effort. Abdomen: Soft to touch, non-tender Ext: No cyanosis, edema, contractures in LUE and BL lower extermities with R worse than left. Skin: No visible rash. Normal palpation of skin.   Musculoskeletal: Normal digits and nails  by inspection. No clubbing.  Neurologic Examination  Mental status/Cognition: intubated. R facial grimace to nares stimulation. Cranial nerves:  Pupils 72mm BL with no response to light. Intact cough, gag, corneals are positive BL and Occulocephalic positive. No gaze deviation noted.  Motor and sensory: Contractures in all extremities except RUE. No response to pain in BL upper extremities. Triple flexion in BL lower extremities.  Labs   Keppra levels pending. Leukocytosis on CBC is trending down spontaneously.  Imaging and Diagnostic studies  MRI Brain with and without contrast:  1. Patchy multifocal T2/FLAIR signal abnormality involving the left thalamus, brainstem, cerebellum, and right cerebral white matter. Appearance felt to be most consistent with PRES. Possible encephalitis/cerebritis difficult to exclude, although felt to be less likely. Correlation with CSF analysis would be recommended if there is concern for  possible intracranial infection. 2. Gyriform diffusion abnormality involving the right parietotemporal cortex and mesial right temporal lobe, most consistent with seizure. 3. Postoperative changes from prior left craniectomy for intraparenchymal hematoma evacuation. Extensive encephalomalacia within the underlying left cerebral hemisphere with protrusion of brain parenchyma through the craniectomy defect. Associated scattered superficial siderosis as above. 4. 1.6 cm intraparenchymal hemorrhage at the anterior margin of the herniated left frontal cortex, stable from prior CT. 5. Multiple scattered chronic micro hemorrhages, most prevalent about the deep gray nuclei and brainstem, most suggestive of chronic poorly controlled hypertension.  Impression   Joshua [name] is a 39 y.o. male with PMH significant for large ICH requiring hemicraniectomy last year followed by development of seizures, prolonged hospital stay last year of upwards of 200 days, finally discharged to a facility in May 2021, returning again in June with breakthrough seizures and discharged back to the facility, returns again with breakthrough seizures and possible status epilepticus in the setting of medication non compliance at the facility 2/2 patient refusal. 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  I reiterated the importance of medications with family and with staff. Patient does not have the capacity to make decisions by himself and attempt should be made to redirect and calm him and give medications via PEG tube.  He has not developed any fever and WBC count is trending down. CXR concerning for possible aspiration PNA for which he is on Zosyn.  Recommendations  - continue Keppra 750mg  BID. - continue cEEG overnight - will likely take off in AM tomorrow if negative. - Agree with weaning off sedation -  Recommend aiming for  gradual normotension. No more than 25 percent drop in SBP over 24 hours. ______________________________________________________________________   Thank you for the opportunity to take part in the care of this patient. If you have any further questions, please contact the neurology consultation attending.  Signed,  [place] Pager Number [phone]

## 2020-08-26 NOTE — ED Notes (Signed)
Pt still noted to be biting on ETT intermittently.

## 2020-08-26 NOTE — Progress Notes (Signed)
Pt admitted from ED. Reported fentanyl started for elevated HR 140s. HR remains elevated despite further increases in fentanyl and propofol. Pt only opens eyes to pain. [place] notified. Awaiting orders.   Belongings include: cell phone and shirt

## 2020-08-26 NOTE — Procedures (Signed)
Arterial Catheter Insertion Procedure Note  Joshua [name]  [id number]  09-Dec-1981  Date:08/26/20  Time:7:04 AM    Provider Performing: Otilio Carpen Cheryln Balcom    Procedure: Insertion of Arterial Line [phone]) with US guidance (81829)   Indication(s) Blood pressure monitoring and/or need for frequent ABGs  Consent Unable to obtain consent due to emergent nature of procedure.  Anesthesia None   Time Out Verified patient identification, verified procedure, site/side was marked, verified correct patient position, special equipment/implants available, medications/allergies/relevant history reviewed, required imaging and test results available.   Sterile Technique Maximal sterile technique including full sterile barrier drape, hand hygiene, sterile gown, sterile gloves, mask, hair covering, sterile ultrasound probe cover (if used).   Procedure Description Area of catheter insertion was cleaned with chlorhexidine and draped in sterile fashion. With real-time ultrasound guidance an arterial catheter was placed into the left radial artery.  Appropriate arterial tracings confirmed on monitor.     Complications/Tolerance None; patient tolerated the procedure well.   EBL Minimal   Specimen(s) None  Otilio Carpen Draylon Mercadel, PA-C

## 2020-08-26 NOTE — Progress Notes (Signed)
LTM maint complete - no skin breakdown under, tech fixed P8, P3, ecg. maint fp1, fp2, f7

## 2020-08-26 NOTE — Progress Notes (Signed)
Pharmacy Antibiotic Note  Nasiah Lehenbauer is a 39 y.o. male admitted on 08/25/2020 with seizures, VDRF and PNA.  Pharmacy has been consulted for Vancomycin  dosing.  Plan: Vancomycin 1250 mg IV q48h Zosyn 3.375 g IV q8h   Height: 6' (182.9 cm) Weight: 57.2 kg (126 lb 1.7 oz) IBW/kg (Calculated) : 77.6  Temp (24hrs), Avg:99.5 F (37.5 C), Min:98.9 F (37.2 C), Max:99.9 F (37.7 C)  Recent Labs  Lab 08/25/20 1946 08/26/20 0239  WBC 17.2* 12.9*  CREATININE 1.72* 1.60*  LATICACIDVEN  --  1.7    Estimated Creatinine Clearance: 49.7 mL/min (A) (by C-G formula based on SCr of 1.6 mg/dL (H)).    No Known Allergies   Caryl Pina 08/26/2020 7:17 AM

## 2020-08-27 ENCOUNTER — Other Ambulatory Visit: Payer: Self-pay

## 2020-08-27 LAB — GLUCOSE, CAPILLARY
Glucose-Capillary: 112 mg/dL — ABNORMAL HIGH (ref 70–99)
Glucose-Capillary: 115 mg/dL — ABNORMAL HIGH (ref 70–99)
Glucose-Capillary: 116 mg/dL — ABNORMAL HIGH (ref 70–99)
Glucose-Capillary: 119 mg/dL — ABNORMAL HIGH (ref 70–99)
Glucose-Capillary: 122 mg/dL — ABNORMAL HIGH (ref 70–99)
Glucose-Capillary: 131 mg/dL — ABNORMAL HIGH (ref 70–99)
Glucose-Capillary: 133 mg/dL — ABNORMAL HIGH (ref 70–99)

## 2020-08-27 LAB — LEVETIRACETAM LEVEL: Levetiracetam Lvl: <1 ug/mL — ABNORMAL LOW (ref 10.0–40.0)

## 2020-08-27 LAB — PHOSPHORUS
Phosphorus: 4.1 mg/dL (ref 2.5–4.6)
Phosphorus: 2.9 mg/dL (ref 2.5–4.6)

## 2020-08-27 LAB — MAGNESIUM
Magnesium: 1.9 mg/dL (ref 1.7–2.4)
Magnesium: 2 mg/dL (ref 1.7–2.4)

## 2020-08-27 MED ORDER — SODIUM CHLORIDE 0.9 % IV BOLUS
1000.0000 mL | Freq: Once | INTRAVENOUS | Status: AC
  Administered 2020-08-27: 1000 mL via INTRAVENOUS

## 2020-08-27 MED ORDER — METOPROLOL TARTRATE 50 MG PO TABS
50.0000 mg | ORAL_TABLET | Freq: Two times a day (BID) | ORAL | Status: DC
  Administered 2020-08-27 – 2020-09-02 (×12): 50 mg
  Filled 2020-08-27 (×12): qty 1

## 2020-08-27 MED ORDER — LISINOPRIL 5 MG PO TABS
5.0000 mg | ORAL_TABLET | Freq: Every day | ORAL | Status: DC
  Administered 2020-08-27 – 2020-08-28 (×2): 5 mg via ORAL
  Filled 2020-08-27 (×2): qty 1

## 2020-08-27 MED ORDER — VITAL AF 1.2 CAL PO LIQD
1000.0000 mL | ORAL | Status: AC
  Administered 2020-08-27 – 2020-08-29 (×3): 1000 mL
  Filled 2020-08-27 (×3): qty 1000

## 2020-08-27 MED ORDER — ACETAMINOPHEN 325 MG PO TABS
650.0000 mg | ORAL_TABLET | ORAL | Status: DC | PRN
  Administered 2020-08-28 – 2020-08-29 (×2): 650 mg
  Filled 2020-08-27 (×2): qty 2

## 2020-08-27 MED ORDER — LABETALOL HCL 5 MG/ML IV SOLN
10.0000 mg | INTRAVENOUS | Status: DC | PRN
  Administered 2020-08-29 (×2): 10 mg via INTRAVENOUS
  Filled 2020-08-27 (×2): qty 4

## 2020-08-27 MED ORDER — PANTOPRAZOLE SODIUM 40 MG PO PACK
40.0000 mg | PACK | Freq: Every day | ORAL | Status: DC
  Administered 2020-08-27 – 2020-08-28 (×2): 40 mg
  Filled 2020-08-27 (×2): qty 20

## 2020-08-27 MED ORDER — FENTANYL CITRATE (PF) 100 MCG/2ML IJ SOLN
INTRAMUSCULAR | Status: AC
  Filled 2020-08-27: qty 2

## 2020-08-27 MED ORDER — FENTANYL CITRATE (PF) 100 MCG/2ML IJ SOLN
50.0000 ug | INTRAMUSCULAR | Status: DC | PRN
  Administered 2020-08-27 (×2): 50 ug via INTRAVENOUS

## 2020-08-27 MED ORDER — METOPROLOL TARTRATE 25 MG PO TABS
25.0000 mg | ORAL_TABLET | Freq: Two times a day (BID) | ORAL | Status: DC
  Administered 2020-08-27: 25 mg
  Filled 2020-08-27: qty 1

## 2020-08-27 MED ORDER — LEVETIRACETAM 100 MG/ML PO SOLN
750.0000 mg | Freq: Two times a day (BID) | ORAL | Status: DC
  Administered 2020-08-27 – 2020-08-28 (×3): 750 mg
  Administered 2020-08-28: 500 mg
  Administered 2020-08-29 – 2020-09-02 (×9): 750 mg
  Filled 2020-08-27: qty 10
  Filled 2020-08-27 (×2): qty 7.5
  Filled 2020-08-27: qty 10
  Filled 2020-08-27 (×2): qty 7.5
  Filled 2020-08-27: qty 10
  Filled 2020-08-27: qty 7.5
  Filled 2020-08-27 (×2): qty 10
  Filled 2020-08-27 (×2): qty 7.5
  Filled 2020-08-27: qty 10
  Filled 2020-08-27: qty 7.5

## 2020-08-27 MED ORDER — HYDROCHLOROTHIAZIDE 25 MG PO TABS
25.0000 mg | ORAL_TABLET | Freq: Every day | ORAL | Status: DC
  Administered 2020-08-27: 25 mg via ORAL
  Filled 2020-08-27 (×2): qty 1

## 2020-08-27 MED ORDER — VITAL HIGH PROTEIN PO LIQD: 1000.0000 mL | ORAL | Status: DC

## 2020-08-27 MED ORDER — AMLODIPINE BESYLATE 10 MG PO TABS
10.0000 mg | ORAL_TABLET | Freq: Every morning | ORAL | Status: DC
  Administered 2020-08-27 – 2020-09-02 (×6): 10 mg
  Filled 2020-08-27 (×8): qty 1

## 2020-08-27 MED ORDER — PROSOURCE TF PO LIQD
45.0000 mL | Freq: Two times a day (BID) | ORAL | Status: DC
  Administered 2020-08-27: 45 mL
  Filled 2020-08-27: qty 45

## 2020-08-27 MED ORDER — FENTANYL 2500MCG IN NS 250ML (10MCG/ML) PREMIX INFUSION
0.0000 ug/h | INTRAVENOUS | Status: DC
  Administered 2020-08-27: 50 ug/h via INTRAVENOUS

## 2020-08-27 MED ORDER — BACLOFEN 10 MG PO TABS
10.0000 mg | ORAL_TABLET | Freq: Every day | ORAL | Status: DC
  Administered 2020-08-27 – 2020-09-01 (×6): 10 mg
  Filled 2020-08-27 (×6): qty 1

## 2020-08-27 NOTE — H&P (Signed)
NAME:  Joshua [name], MRN:  [id number], DOB:  [birth year], LOS: 1 ADMISSION DATE:  08/25/2020, CONSULTATION DATE:  08/27/20 REFERRING MD:  EDP CHIEF COMPLAINT:  seizures  Brief History   39 y.o. 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  At baseline he is nonverbal, though is awake and tracks with his eyes.  He was noted to have around 5 breakthrough seizures today at his facility without return to baseline so EMS was called.  There is mention he may have been refusing his Keppra.  He was given Versed in route.  In the ED, CT head with small anterior frontal bleed, possibly larger than previous with multiple hypodensities in the left thalamus, left cerebral and right cerebral white matter concerning for possible PRES. As he was obtunded with hypercarbia, he was intubated for airway protection.  LTM EEG initiated without initial seizure activity.  He was started on Cleviprex for blood pressure control and MRI ordered.  Labs significant for leukocytosis without clear source of infection.  PCCM consulted for admission  Past Medical History  Seizure disorder, [place] [facility] Events   8/30 Admit to PCCM  Consults:  Neurology  Procedures:  8/29 ETT  Significant Diagnostic Tests:  8/29 CT head>>1.6 cm focus of intraparenchymal hyperdensity , Increased hypodensity involving the left thalamus, midbrain, brainstem, and cerebellum. Findings are nonspecific, and could be related to seizure/status epilepticus, although the diffuse nature of these findings would be  somewhat unusual for thisentity. Possible PRES could also be considered 8/29 CXR>>Interval development of right lower lobe consolidation, favor atelectasis.  Micro Data:  8/29 Sars-CoV-2>>negative  Antimicrobials:  More awake today.  Tolerating SBT when not biting on tube.  Hypertensive.  Interim history/subjective:  Pt started on Cleviprex for HTN  Objective   Blood pressure (!) 197/124, pulse (!) 127, temperature 98.8 F (37.1 C), temperature source Axillary, resp. rate 19, height 6' (1.829 m), weight 60.5 kg, SpO2 91 %.    Vent Mode: PRVC FiO2 (%):  [40 %-60 %] 40 % Set Rate:  [12 bmp] 12 bmp Vt Set:  [620 mL] 620 mL PEEP:  [5 cmH20] 5 cmH20 Plateau Pressure:  [16 cmH20-17 cmH20] 16 cmH20   Intake/Output Summary (Last 24 hours) at 08/27/2020 1102 Last data filed at 08/27/2020 1000 Gross per 24 hour  Intake 481.03 ml  Output 200 ml  Net 281.03 ml   Filed Weights   08/26/20 0400 08/27/20 0425  Weight: 57.2 kg 60.5 kg    General:  Thin, chronically ill-appearing M, appears awake. HEENT: MM pink/moist, ETT in place Neuro: Alert.  Eyes open.  No seizure activity.  Reaching for endotracheal tube with left arm. CV: s1s2 rrr, no m/r/g PULM: Chest clear bilaterally GI: soft, bsx4 active Extremities: warm/dry, thin, no  edema  Skin: no rashes or lesions   Resolved Hospital Problem list     Assessment & Plan:   Critically ill due to seizures with acute on chronic encephalopathy and possible PRES/ Hypertensive Emergency No further seizure activity. -Discontinue continuous EEG -Continue current antihypertensive regimen.  Hypertension/PRES -Resume home medications except for  clonidine patch which may cause somnolence. -Adding hydrochlorothiazide and starting lisinopril. -Follow creatinine  Critically ill due to hypercapnic  Respiratory Failure requiring mechanical ventilation. -SBT today and extubate if tolerated.  Leukocytosis -WBC and gram-negative rods on  tracheal aspirate. -Continue Zosyn until speciation. -Treat for 7 days for suspected pneumonia.  CKD -Creatinine 1.7 near baseline, avoid nephrotoxins as able, monitor UOP and electrolytes -ACE inhibitor should prevent further progression of renal disease.  Best practice:  Diet: NPO Pain/Anxiety/Delirium protocol (if indicated): Propofol/Fentanyl VAP protocol (if indicated): HOB 30 degrees, suction prn DVT prophylaxis: SCD's GI prophylaxis: protonix Glucose control: SSI Mobility: bed rest Code Status: full code Family Communication: updated by Neurology Disposition: ICU  Labs   CBC: Recent Labs  Lab 08/25/20 1946 08/25/20 2240 08/26/20 0239 08/26/20 0317  WBC 17.2*  --  12.9*  --   HGB 14.4 14.6 15.3 16.7  HCT 47.9 43.0 49.0 49.0  MCV 84.5  --  80.5  --   PLT 404*  --  351  --     Basic Metabolic Panel: Recent Labs  Lab 08/25/20 1946 08/25/20 2240 08/26/20 0239 08/26/20 0317 08/27/20 0848  NA 137 136 136 139  --   K 3.3* 3.4* 3.3* 3.7  --   CL 96*  --  98  --   --   CO2 19*  --  25  --   --   GLUCOSE 205*  --  129*  --   --   BUN 19  --  20  --   --   CREATININE 1.72*  --  1.60*  --   --   CALCIUM 9.8  --  9.8  --   --   MG 2.1  --  2.0  --  1.9  PHOS  --   --  2.8  --  4.1   GFR: Estimated Creatinine Clearance: 52.5 mL/min (A) (by C-G formula based on SCr of 1.6 mg/dL (H)). Recent Labs  Lab 08/25/20 1946 08/26/20 0239  WBC 17.2* 12.9*  LATICACIDVEN  --  1.7    Liver Function Tests: Recent Labs  Lab 08/25/20 1946  AST 22  ALT 11  ALKPHOS 115  BILITOT 0.4  PROT 8.2*  ALBUMIN 3.3*   No results for input(s): LIPASE, AMYLASE in the last 168 hours. No results for input(s): AMMONIA in the last 168 hours.  ABG    Component Value Date/Time   PHART 7.523 (H) 08/26/2020 0317   PCO2ART 33.4 08/26/2020 0317   PO2ART 57 (L) 08/26/2020 0317   HCO3 27.4 08/26/2020 0317   TCO2 28 08/26/2020 0317   ACIDBASEDEF 6.0 (H) 06/24/2020 0347   O2SAT 92.0  08/26/2020 0317     Coagulation Profile: No results for input(s): INR, PROTIME in the last 168 hours.  Cardiac Enzymes: No results for input(s): CKTOTAL, CKMB, CKMBINDEX, TROPONINI in the last 168 hours.  HbA1C: Hgb A1c MFr Bld  Date/Time Value Ref Range Status  08/26/2020 02:39 AM 5.9 (H) 4.8 - 5.6 % Final    Comment:    (NOTE) Pre diabetes:          5.7%-6.4%  Diabetes:              >6.4%  Glycemic control for   <7.0% adults with diabetes   06/24/2020 12:48 AM 6.0 (H) 4.8 - 5.6 % Final    Comment:    (NOTE) Pre diabetes:          5.7%-6.4%  Diabetes:              >  6.4%  Glycemic control for   <7.0% adults with diabetes     CBG: Recent Labs  Lab 08/26/20 0804 08/26/20 1157 08/26/20 1544 08/26/20 2000 08/27/20 0807  GLUCAP 157* 168* 130* 131* 116*     CRITICAL CARE Performed by: Kipp Brood   Total critical care time:40 minutes  Critical care time was exclusive of separately billable procedures and treating other patients.  Critical care was necessary to treat or prevent imminent or life-threatening deterioration.  Critical care was time spent personally by me on the following activities: development of treatment plan with patient and/or surrogate as well as nursing, discussions with consultants, evaluation of patient's response to treatment, examination of patient, obtaining history from patient or surrogate, ordering and performing treatments and interventions, ordering and review of laboratory studies, ordering and review of radiographic studies, pulse oximetry and re-evaluation of patient's condition.   Kipp Brood, MD

## 2020-08-27 NOTE — Progress Notes (Signed)
vLTM EEG complete. No skin breakdown 

## 2020-08-27 NOTE — Progress Notes (Signed)
Initial Nutrition Assessment  DOCUMENTATION CODES:   Non-severe (moderate) malnutrition in context of chronic illness, Underweight  INTERVENTION:   Initiate tube feeding via PEG tube: Vital AF 1.2 at 60 ml/h (1440 ml per day)  Provides 1728 kcal, 108 gm protein, 1167 ml free water daily   NUTRITION DIAGNOSIS:   Moderate Malnutrition related to chronic illness (sz disorder with lengthy hospitalization) as evidenced by mild fat depletion, mild muscle depletion.  GOAL:   Patient will meet greater than or equal to 90% of their needs  MONITOR:   TF tolerance, Vent status  REASON FOR ASSESSMENT:   Consult Enteral/tube feeding initiation and management  ASSESSMENT:   Pt with PMH of seizure disorder and ICH in 08/2019 s/p hemicraniectomy, PEG placement with prolonged hospital stay with d/c to SNF 04/2020. Pt admitted with breakthrough seizures with report of refusing medications.   Pt discussed during ICU rounds and with RN.  Per RN pt on no sedation.   Per chart review pt with PEG placement during previous admission however pt had advanced to PO diet and was eating and not using PEG PTA.  During previous admission pt was dx with severe malnutrition, this has improved however pt continues to meet criteria for moderate malnutrition.   Patient is currently intubated on ventilator support MV: 6.1 L/min Temp (24hrs), Avg:98.7 F (37.1 C), Min:97.7 F (36.5 C), Max:99.3 F (37.4 C)  Medications reviewed and include: SSI Labs reviewed: CBG's: 116-131   NUTRITION - FOCUSED PHYSICAL EXAM:  Right sided depletions not listed below; likely from disuse     Most Recent Value  Orbital Region Mild depletion  Upper Arm Region No depletion  Thoracic and Lumbar Region Mild depletion  Buccal Region Unable to assess  Temple Region Mild depletion  Clavicle Bone Region Moderate depletion  Clavicle and Acromion Bone Region Mild depletion  Scapular Bone Region Unable to assess  Dorsal  Hand Mild depletion  Patellar Region No depletion  Anterior Thigh Region Mild depletion  Posterior Calf Region Mild depletion  Edema (RD Assessment) None  Hair Reviewed  Eyes Unable to assess  Mouth Unable to assess  Skin Reviewed  Nails Reviewed       Diet Order:   Diet Order            Diet NPO time specified  Diet effective now                 EDUCATION NEEDS:   No education needs have been identified at this time  Skin:     Last BM:     Height:   Ht Readings from Last 1 Encounters:  08/26/20 6' (1.829 m)    Weight:   Wt Readings from Last 1 Encounters:  08/27/20 60.5 kg    Ideal Body Weight:     BMI:  Body mass index is 18.09 kg/m.  Estimated Nutritional Needs:   Kcal:  1800  Protein:  90-110 grams  Fluid:  > 1.8 L/day  Lockie Pares., RD, LDN, CNSC See AMiON for contact information

## 2020-08-27 NOTE — Procedures (Signed)
Patient Name:Joshua [name] JYL:[id number] Epilepsy Attending:Jasmarie Coppock Barbra Sarks Referring Physician/Provider:Dr Amie [place] Duration: 08/26/2020 2307 to 08/27/2020 0959  Patient history:39 year old male with history of seizure and prior left ICH s/p hemi-crani who presented after multiple breakthrough seizures. EEG to evaluate for seizure  Level of alertness:lethargic  AEDs during EEG study:Ativan, lev  Technical aspects: This EEG study was done with scalp electrodes positioned according to the 10-20 International system of electrode placement. Electrical activity was acquired at a sampling rate of 500Hz  and reviewed with a high frequency filter of 70Hz  and a low frequency filter of 1Hz . EEG data were recorded continuously and digitally stored.   Description: EEG showed continuous generalized polymorphic 3 to 6 Hz theta-delta hz slowing. Hyperventilation and photic stimulation were not performed.  Of note, parts of this study was technically difficult due to significant movement and electrode artifact  ABNORMALITY -Continuous slow, generalized   IMPRESSION: Thistechnically difficultstudy is suggestive of moderate diffuse encephalopathy, nonspecific etiology.  No seizures or definite epileptiform discharges were seen during the study.  EEG appears to be improving compared to previous day.  [name] Barbra Sarks

## 2020-08-28 ENCOUNTER — Encounter (HOSPITAL_COMMUNITY): Payer: Self-pay | Admitting: Pulmonary Disease

## 2020-08-28 LAB — BASIC METABOLIC PANEL
Anion gap: 11 (ref 5–15)
BUN: 55 mg/dL — ABNORMAL HIGH (ref 6–20)
CO2: 27 mmol/L (ref 22–32)
Calcium: 8.8 mg/dL — ABNORMAL LOW (ref 8.9–10.3)
Chloride: 104 mmol/L (ref 98–111)
Creatinine, Ser: 2.65 mg/dL — ABNORMAL HIGH (ref 0.61–1.24)
GFR calc Af Amer: 33 mL/min — ABNORMAL LOW (ref >60)
GFR calc non Af Amer: 29 mL/min — ABNORMAL LOW (ref >60)
Glucose, Bld: 114 mg/dL — ABNORMAL HIGH (ref 70–99)
Potassium: 3.5 mmol/L (ref 3.5–5.1)
Sodium: 142 mmol/L (ref 135–145)

## 2020-08-28 LAB — CBC
HCT: 32.4 % — ABNORMAL LOW (ref 39.0–52.0)
Hemoglobin: 10.2 g/dL — ABNORMAL LOW (ref 13.0–17.0)
MCH: 25.9 pg — ABNORMAL LOW (ref 26.0–34.0)
MCHC: 31.5 g/dL (ref 30.0–36.0)
MCV: 82.2 fL (ref 80.0–100.0)
Platelets: 281 10*3/uL (ref 150–400)
RBC: 3.94 MIL/uL — ABNORMAL LOW (ref 4.22–5.81)
RDW: 16.4 % — ABNORMAL HIGH (ref 11.5–15.5)
WBC: 16.3 10*3/uL — ABNORMAL HIGH (ref 4.0–10.5)
nRBC: 0 % (ref 0.0–0.2)

## 2020-08-28 LAB — MAGNESIUM
Magnesium: 2.2 mg/dL (ref 1.7–2.4)
Magnesium: 2.1 mg/dL (ref 1.7–2.4)

## 2020-08-28 LAB — GLUCOSE, CAPILLARY
Glucose-Capillary: 100 mg/dL — ABNORMAL HIGH (ref 70–99)
Glucose-Capillary: 114 mg/dL — ABNORMAL HIGH (ref 70–99)
Glucose-Capillary: 115 mg/dL — ABNORMAL HIGH (ref 70–99)
Glucose-Capillary: 120 mg/dL — ABNORMAL HIGH (ref 70–99)
Glucose-Capillary: 130 mg/dL — ABNORMAL HIGH (ref 70–99)
Glucose-Capillary: 96 mg/dL (ref 70–99)

## 2020-08-28 LAB — PHOSPHORUS
Phosphorus: 2.7 mg/dL (ref 2.5–4.6)
Phosphorus: 1.6 mg/dL — ABNORMAL LOW (ref 2.5–4.6)

## 2020-08-28 MED ORDER — SODIUM CHLORIDE 0.9 % IV SOLN
2.0000 g | INTRAVENOUS | Status: DC
  Administered 2020-08-28 – 2020-09-02 (×6): 2 g via INTRAVENOUS
  Filled 2020-08-28: qty 2
  Filled 2020-08-28: qty 20
  Filled 2020-08-28 (×4): qty 2
  Filled 2020-08-28: qty 20

## 2020-08-28 MED ORDER — CHLORHEXIDINE GLUCONATE 0.12 % MT SOLN
15.0000 mL | Freq: Two times a day (BID) | OROMUCOSAL | Status: DC
  Administered 2020-08-28 – 2020-09-02 (×9): 15 mL via OROMUCOSAL
  Filled 2020-08-28 (×8): qty 15

## 2020-08-28 MED ORDER — ORAL CARE MOUTH RINSE
15.0000 mL | Freq: Two times a day (BID) | OROMUCOSAL | Status: DC
  Administered 2020-08-28 – 2020-09-02 (×10): 15 mL via OROMUCOSAL

## 2020-08-28 MED ORDER — HYDROCHLOROTHIAZIDE 25 MG PO TABS
25.0000 mg | ORAL_TABLET | Freq: Every day | ORAL | Status: DC
  Administered 2020-08-28: 25 mg

## 2020-08-28 NOTE — Progress Notes (Signed)
NAME:  Joshua [name], MRN:  [id number], DOB:  April 05, 1981, LOS: 2 ADMISSION DATE:  08/25/2020, CONSULTATION DATE:  08/28/20 REFERRING MD:  EDP CHIEF COMPLAINT:  seizures  Brief History   39 y.o. 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  At baseline he is nonverbal, though is awake and tracks with his eyes.  He was noted to have around 5 breakthrough seizures today at his facility without return to baseline so EMS was called.  There is mention he may have been refusing his Keppra.  He was given Versed in route.  In the ED, CT head with small anterior frontal bleed, possibly larger than previous with multiple hypodensities in the left thalamus, left cerebral and right cerebral white matter concerning for possible PRES. As he was obtunded with hypercarbia, he was intubated for airway protection.  LTM EEG initiated without initial seizure activity.  He was started on Cleviprex for blood pressure control and MRI ordered.  Labs significant for leukocytosis without clear source of infection.  PCCM consulted for admission  Past Medical History  Seizure disorder, [place] [facility] Events   8/30 Admit to PCCM  Consults:  Neurology  Procedures:  8/29 ETT > 9/1  Significant Diagnostic Tests:  8/29 CT head>>1.6 cm focus of intraparenchymal hyperdensity , Increased hypodensity involving the left thalamus, midbrain, brainstem, and cerebellum. Findings are nonspecific, and could be related to seizure/status epilepticus, although the diffuse nature of these findings  would be somewhat unusual for thisentity. Possible PRES could also be considered 8/29 CXR>>Interval development of right lower lobe consolidation, favor Atelectasis. MRI brain 8/30 > Imaging consistent with PRES. Possible encephalitis/cerebritis difficult to exclude. Gyriform diffusion abnormality involving the right parietotemporal cortex and mesial right temporal lobe, most consistent with seizure. Postoperative changes from prior left craniectomy for intraparenchymal hematoma evacuation. Extensive encephalomalacia within the underlying left cerebral hemisphere with protrusion of brain parenchyma through the craniectomy defect. 1.6 cm intraparenchymal hemorrhage at the anterior margin of the herniated left frontal cortex. Multiple scattered chronic micro hemorrhages, most prevalent about the deep gray nuclei and brainstem.  8/31 continuous EEG > no sz. Slowing.   Micro Data:  8/29 Sars-CoV-2>>negative Sputum 8/30 > moderate Strep pneumoniae >>> Sputum 8/30 > Moderate yeast >>> Blood 8/30 >  Antimicrobials:  Zosyn 8/30 >  Interim history/subjective:  More awake today. Weaning well on SBT.   Objective   Blood pressure (!) 139/97, pulse (!) 101, temperature 98.9 F (37.2 C), temperature source Axillary, resp. rate 19, height 6' (1.829 m), weight 59.3 kg, SpO2 100 %.    Vent Mode: CPAP;PSV FiO2 (%):  [40 %] 40 % Set Rate:  [12 bmp] 12 bmp Vt Set:  [620 mL] 620 mL PEEP:  [5 cmH20] 5 cmH20 Pressure Support:  [5 cmH20-10 cmH20] 5 cmH20 Plateau Pressure:  [15 cmH20-17 cmH20] 17 cmH20   Intake/Output Summary (Last 24 hours) at 08/28/2020 1030 Last data filed at 08/28/2020 1000 Gross per 24 hour  Intake 1884.03 ml  Output 1035 ml  Net 849.03 ml   Filed Weights   08/26/20 0400 08/27/20 0425 08/28/20 0500  Weight: 57.2 kg 60.5 kg 59.3 kg    General: Thin middle aged male in NAD HEENT: [place]/AT, PERRL, no JVD Neuro: awake, moving LUE, not following commands.  CV: RRR, no MRG PULM: Clear  bilateral breath sounds GI: Soft, non-tender, non-distended Extremities: No acute deformity. No edema.  Skin: Grossly intact.    Resolved Hospital Problem list     Assessment & Plan:   Seizure - long term EEG discontinued 8/31 - continue keppra  Hypertension/PRES: now off clevidipine  - amlodipine, HCTZ, lisinopril, metoprolol - Do not restart home clonidine due to somnolence.  - Send BMP due to recent starting of lisinopril  Hypercapnic  Respiratory Failure requiring mechanical ventilation. - extubate  CAP - strep pneumoniae on cultures awaiting sensitivities - Narrow to ceftriaxone - Treat for 7 days for suspected pneumonia.  CKD - Creatinine 1.7 near baseline, avoid nephrotoxins as able, monitor UOP and electrolytes - ACE inhibitor should prevent further progression of renal disease. - Repeat BMP  Best practice:  Diet: NPO Pain/Anxiety/Delirium protocol (if indicated): Propofol/Fentanyl VAP protocol (if indicated): HOB 30 degrees, suction prn DVT prophylaxis: SCD's GI prophylaxis: protonix Glucose control: SSI Mobility: bed rest Code Status: full code Family Communication: Attempted to call mother and brother, could not leave message Disposition: ICU  Labs   CBC: Recent Labs  Lab 08/25/20 1946 08/25/20 2240 08/26/20 0239 08/26/20 0317  WBC 17.2*  --  12.9*  --   HGB 14.4 14.6 15.3 16.7  HCT 47.9 43.0 49.0 49.0  MCV 84.5  --  80.5  --   PLT 404*  --  351  --     Basic Metabolic Panel: Recent Labs  Lab 08/25/20 1946 08/25/20 2240 08/26/20 0239 08/26/20 0317 08/27/20 0848 08/27/20 1812 08/28/20 0407  NA 137 136 136 139  --   --   --   K 3.3* 3.4* 3.3* 3.7  --   --   --   CL 96*  --  98  --   --   --   --   CO2 19*  --  25  --   --   --   --   GLUCOSE 205*  --  129*  --   --   --   --   BUN 19  --  20  --   --   --   --   CREATININE 1.72*  --  1.60*  --   --   --   --   CALCIUM 9.8  --  9.8  --   --   --   --   MG 2.1  --  2.0  --  1.9 2.0  2.2  PHOS  --   --  2.8  --  4.1 2.9 2.7   GFR: Estimated Creatinine Clearance: 51.5 mL/min (A) (by C-G formula based on SCr of 1.6 mg/dL (H)). Recent Labs  Lab 08/25/20 1946 08/26/20 0239  WBC 17.2* 12.9*  LATICACIDVEN  --  1.7    Liver Function Tests: Recent Labs  Lab 08/25/20 1946  AST 22  ALT 11  ALKPHOS 115  BILITOT 0.4  PROT 8.2*  ALBUMIN 3.3*   No results for input(s): LIPASE, AMYLASE in the last 168 hours. No results for input(s): AMMONIA in the last 168 hours.  ABG    Component Value Date/Time   PHART 7.523 (H) 08/26/2020 0317   PCO2ART 33.4 08/26/2020 0317   PO2ART 57 (L) 08/26/2020 0317   HCO3 27.4 08/26/2020 0317  TCO2 28 08/26/2020 0317   ACIDBASEDEF 6.0 (H) 06/24/2020 0347   O2SAT 92.0 08/26/2020 0317     Coagulation Profile: No results for input(s): INR, PROTIME in the last 168 hours.  Cardiac Enzymes: No results for input(s): CKTOTAL, CKMB, CKMBINDEX, TROPONINI in the last 168 hours.  HbA1C: Hgb A1c MFr Bld  Date/Time Value Ref Range Status  08/26/2020 02:39 AM 5.9 (H) 4.8 - 5.6 % Final    Comment:    (NOTE) Pre diabetes:          5.7%-6.4%  Diabetes:              >6.4%  Glycemic control for   <7.0% adults with diabetes   06/24/2020 12:48 AM 6.0 (H) 4.8 - 5.6 % Final    Comment:    (NOTE) Pre diabetes:          5.7%-6.4%  Diabetes:              >6.4%  Glycemic control for   <7.0% adults with diabetes     CBG: Recent Labs  Lab 08/27/20 1535 08/27/20 1941 08/27/20 2329 08/28/20 0332 08/28/20 0723  GLUCAP 112* 133* 122* 96 120*    Critical care time 35 mins   Georgann Housekeeper, AGACNP-BC [place] for personal pager PCCM on call pager [phone]  08/28/2020 10:50 AM

## 2020-08-28 NOTE — NC FL2 (Signed)
[place] LEVEL OF CARE SCREENING TOOL     IDENTIFICATION  [name] Birthdate: [birth year] Sex: male Admission Date (Current Location): 08/25/2020  [place] and [place] Number:  Kathleen Argue [phone] [place] and Address:  [facility] [place] [address] [place], [place] [address]      Provider Number: [id number]  Attending Physician Name and Address:  Collene Gobble, MD  Relative Name and Phone Number:  [name], mother    Current Level of Care: Hospital Recommended Level of Care: [place] Prior Approval Number:    Date Approved/Denied:   PASRR Number: [id number]  Discharge Plan: SNF    Current Diagnoses: Patient Active Problem List   Diagnosis Date Noted  . Status epilepticus ([place])   . PRES (posterior reversible encephalopathy syndrome)   . Hypertensive urgency 07/05/2020  . Noncompliance with medication treatment due to intermittent use of medication 07/05/2020  . 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 PEG (percutaneous endoscopic gastrostomy) status ([place])   . Palliative care by specialist   . Goals of care, counseling/discussion   . Acute kidney injury ([place])   . Malignant hypertension 10/07/2019  . Severe protein-calorie malnutrition ([place]) 10/07/2019  . CKD (chronic kidney disease), stage IV ([place]) 10/07/2019  . Seizure ([place]) 10/07/2019  . Acute respiratory failure with hypoxia and hypercapnia (HCC)   . Hemorrhagic stroke ([place])   . Cytotoxic brain edema ([place]) 09/10/2019  . ICH (intracerebral hemorrhage) ([place]) 09/09/2019  . Subdural hematoma ([place]) 09/09/2019  . Intracranial hemorrhage ([place]) 09/09/2019    Orientation RESPIRATION BLADDER Height & Weight     Self  O2 Incontinent Weight: 130 lb 11.7 oz (59.3 kg) Height:  6' (182.9 cm)  BEHAVIORAL SYMPTOMS/MOOD NEUROLOGICAL BOWEL NUTRITION STATUS    Convulsions/Seizures, Grand mal Incontinent Feeding tube  AMBULATORY STATUS COMMUNICATION  OF NEEDS Skin   Total Care Non-Verbally Normal                       Personal Care Assistance Level of Assistance  Total care       Total Care Assistance: Maximum assistance   Functional Limitations Info             SPECIAL CARE FACTORS FREQUENCY                       Contractures Contractures Info: Not present    Additional Factors Info  Code Status, Allergies Code Status Info: full Allergies Info: NKA           Current Medications (08/28/2020):  This is the current hospital active medication list Current Facility-Administered Medications  Medication Dose Route Frequency Provider Last Rate Last Admin  . acetaminophen (TYLENOL) tablet 650 mg  650 mg Per Tube Q4H PRN Agarwala, Einar Grad, MD      . amLODipine (NORVASC) tablet 10 mg  10 mg Per Tube q AM Kipp Brood, MD   10 mg at 08/27/20 0849  . baclofen (LIORESAL) tablet 10 mg  10 mg Per Tube QHS Kipp Brood, MD   10 mg at 08/27/20 2116  . cefTRIAXone (ROCEPHIN) 2 g in sodium chloride 0.9 % 100 mL IVPB  2 g Intravenous Q24H Corey Harold, NP 200 mL/hr at 08/28/20 1217 2 g at 08/28/20 1217  . chlorhexidine (PERIDEX) 0.12 % solution 15 mL  15 mL Mouth Rinse BID Corey Harold, NP      . Chlorhexidine Gluconate Cloth 2 %  PADS 6 each  6 each Topical Daily Collier Bullock, MD   6 each at 08/28/20 1000  . docusate sodium (COLACE) capsule 100 mg  100 mg Oral BID PRN Gleason, Otilio Carpen, PA-C      . feeding supplement (VITAL AF 1.2 CAL) liquid 1,000 mL  1,000 mL Per Tube Continuous Kipp Brood, MD 60 mL/hr at 08/28/20 0955 1,000 mL at 08/28/20 0955  . fentaNYL (SUBLIMAZE) injection 50 mcg  50 mcg Intravenous Q1H PRN Kipp Brood, MD   50 mcg at 08/27/20 1705  . insulin aspart (novoLOG) injection 0-15 Units  0-15 Units Subcutaneous Q4H Gleason, Otilio Carpen, PA-C   2 Units at 08/28/20 0006  . labetalol (NORMODYNE) injection 10 mg  10 mg Intravenous Q10 min PRN Kipp Brood, MD      . levETIRAcetam (KEPPRA) 100 MG/ML  solution 750 mg  750 mg Per Tube BID Kipp Brood, MD   750 mg at 08/28/20 0948  . MEDLINE mouth rinse  15 mL Mouth Rinse q12n4p Corey Harold, NP   15 mL at 08/28/20 1426  . metoprolol tartrate (LOPRESSOR) tablet 50 mg  50 mg Per Tube BID Kipp Brood, MD   50 mg at 08/28/20 0949  . pantoprazole sodium (PROTONIX) 40 mg/20 mL oral suspension 40 mg  40 mg Per Tube QHS Collier Bullock, MD   40 mg at 08/27/20 2116  . polyethylene glycol (MIRALAX / GLYCOLAX) packet 17 g  17 g Oral Daily PRN Gleason, Otilio Carpen, PA-C         Discharge Medications: Please see discharge summary for a list of discharge medications.  Relevant Imaging Results:  Relevant Lab Results:   Additional Information SS#: [id number]  Joanne Chars, LCSW

## 2020-08-28 NOTE — Progress Notes (Signed)
NEUROLOGY CONSULTATION NOTE   Date of service: August 28, 2020 [name] MRN:  [id number] DOB:  October 22, 1981  Interval Hx   Briefly maintains eye contact. Moving his RUE and RLE spontaneously. Team planning on SBT and potential extubation today.  Vitals  Temp:  [97.9 F (36.6 C)-99.7 F (37.6 C)] 98.1 F (36.7 C) (09/01 0800) Pulse Rate:  [10-124] 69 (09/01 1200) Resp:  [12-22] 17 (09/01 1200) BP: (95-177)/(78-120) 115/85 (09/01 1200) SpO2:  [95 %-100 %] 100 % (09/01 1200) FiO2 (%):  [40 %] 40 % (09/01 0950) Weight:  [59.3 kg] 59.3 kg (09/01 0500)  Body mass index is 17.73 kg/m.  Physical Exam   General: Laying comfortably in bed; intubated and in no acute distress. HENT: Normal oropharynx and mucosa. Normal external appearance of ears and nose. Neck: Supple, no pain or tenderness CV: No JVD. No peripheral edema. Pulmonary: Symmetric Chest rise. Normal respiratory effort. Abdomen: Soft to touch, non-tender Ext: No cyanosis, edema, contractures in LUE and BL lower extermities with R worse than left. Skin: No visible rash. Normal palpation of skin.   Musculoskeletal: Normal digits and nails by inspection. No clubbing.  Neurologic Examination  Mental status/Cognition: intubated. Eyes open. Briefly maintains eye contact. Cranial nerves:  Pupils 78mm BL with sluggish and minimal reactivity to light. Intact cough, gag, corneals are positive BL and Occulocephalic positive. No gaze deviation noted.  Motor and sensory: Moving RUE spontaneously and in possey mitts to prevent him from pulling out the ETT. Moves RLE spontaneously and localizes to pain in RLE. Contractures in all extremities except RUE.   Labs   Keppra levels pending. Leukocytosis on CBC is trending down spontaneously.  Imaging and Diagnostic studies  MRI Brain with and without contrast:  1. Patchy multifocal T2/FLAIR signal abnormality involving the left thalamus, brainstem, cerebellum, and  right cerebral white matter. Appearance felt to be most consistent with PRES. Possible encephalitis/cerebritis difficult to exclude, although felt to be less likely. Correlation with CSF analysis would be recommended if there is concern for possible intracranial infection. 2. Gyriform diffusion abnormality involving the right parietotemporal cortex and mesial right temporal lobe, most consistent with seizure. 3. Postoperative changes from prior left craniectomy for intraparenchymal hematoma evacuation. Extensive encephalomalacia within the underlying left cerebral hemisphere with protrusion of brain parenchyma through the craniectomy defect. Associated scattered superficial siderosis as above. 4. 1.6 cm intraparenchymal hemorrhage at the anterior margin of the herniated left frontal cortex, stable from prior CT. 5. Multiple scattered chronic micro hemorrhages, most prevalent about the deep gray nuclei and brainstem, most suggestive of chronic poorly controlled hypertension.  Impression   Joshua [name] is a 39 y.o. male with PMH significant for large ICH requiring hemicraniectomy last year followed by development of seizures, prolonged hospital stay last year of upwards of 200 days, finally discharged to a facility in May 2021, returning again in June with breakthrough seizures and discharged back to the facility, returns again with breakthrough seizures and possible status epilepticus in the setting of medication non compliance at the facility 2/2 patient refusal.  Exam with intubated, eyes open, brief eye contact. Improved mentation, moving RUE and RLE spontaneously.  Recommendations  - continue Keppra 750mg  BID. ______________________________________________________________________   Thank you for the opportunity to take part in the care of this patient. If you have any further questions, please contact the neurology consultation attending.  Signed,  [place] Pager Number [phone]

## 2020-08-28 NOTE — Procedures (Signed)
Extubation Procedure Note  Patient Details:   Name: Joshua [name] DOB: 12/26/1981 MRN: [id number]   Airway Documentation:    Vent end date: 08/28/20 Vent end time: 1015   Evaluation  O2 sats: stable throughout Complications: No apparent complications Patient did tolerate procedure well. Bilateral Breath Sounds: Clear, Diminished   No   RT extubated patient to 4L [place] per MD order with RN at bedside. Positive cuff leak noted. No stridor noted at this time. RT will continue to monitor as needed.   Fabiola Backer 08/28/2020, 10:19 AM

## 2020-08-29 LAB — GLUCOSE, CAPILLARY
Glucose-Capillary: 104 mg/dL — ABNORMAL HIGH (ref 70–99)
Glucose-Capillary: 122 mg/dL — ABNORMAL HIGH (ref 70–99)
Glucose-Capillary: 127 mg/dL — ABNORMAL HIGH (ref 70–99)
Glucose-Capillary: 88 mg/dL (ref 70–99)
Glucose-Capillary: 90 mg/dL (ref 70–99)
Glucose-Capillary: 95 mg/dL (ref 70–99)

## 2020-08-29 LAB — CBC
HCT: 37.5 % — ABNORMAL LOW (ref 39.0–52.0)
Hemoglobin: 11.5 g/dL — ABNORMAL LOW (ref 13.0–17.0)
MCH: 24.8 pg — ABNORMAL LOW (ref 26.0–34.0)
MCHC: 30.7 g/dL (ref 30.0–36.0)
MCV: 81 fL (ref 80.0–100.0)
Platelets: 301 10*3/uL (ref 150–400)
RBC: 4.63 MIL/uL (ref 4.22–5.81)
RDW: 16.2 % — ABNORMAL HIGH (ref 11.5–15.5)
WBC: 12.2 10*3/uL — ABNORMAL HIGH (ref 4.0–10.5)
nRBC: 0.2 % (ref 0.0–0.2)

## 2020-08-29 LAB — BASIC METABOLIC PANEL
Anion gap: 9 (ref 5–15)
BUN: 42 mg/dL — ABNORMAL HIGH (ref 6–20)
CO2: 29 mmol/L (ref 22–32)
Calcium: 9.6 mg/dL (ref 8.9–10.3)
Chloride: 106 mmol/L (ref 98–111)
Creatinine, Ser: 2.08 mg/dL — ABNORMAL HIGH (ref 0.61–1.24)
GFR calc Af Amer: 45 mL/min — ABNORMAL LOW (ref >60)
GFR calc non Af Amer: 39 mL/min — ABNORMAL LOW (ref >60)
Glucose, Bld: 98 mg/dL (ref 70–99)
Potassium: 3.5 mmol/L (ref 3.5–5.1)
Sodium: 144 mmol/L (ref 135–145)

## 2020-08-29 MED ORDER — CLONIDINE HCL 0.3 MG/24HR TD PTWK
0.3000 mg | MEDICATED_PATCH | TRANSDERMAL | Status: DC
  Administered 2020-08-29: 0.3 mg via TRANSDERMAL
  Filled 2020-08-29: qty 1

## 2020-08-29 MED ORDER — FREE WATER
200.0000 mL | Freq: Four times a day (QID) | Status: DC
  Administered 2020-08-29 – 2020-09-02 (×17): 200 mL

## 2020-08-29 NOTE — Progress Notes (Signed)
SLP Cancellation Note  Patient Details Name: Joshua [name] MRN: [id number] DOB: 03-Jan-1981   Cancelled treatment:       Unable to complete BSE at this time, due to pt refusal to participate. Pt was awake and alert, nonvocal. He would not allow oral care or PO trials. Pt turned his face away, tightened lip closure, and pushed my hand away with all attempts.   Pt is known to ST services from previous admissions. MBS was completed 06/28/20 with recommendation for regular solids, thin liquids (via cup or straw), meds whole in puree.   Consulted with NP - given no new neurological event per MRI, pt should tolerate resuming regular diet and thin liquids. He has PEG tube if he continues to refuse PO intake. SLP will follow for assessment/diet tolerance.   Jahna Liebert B. [name], [facility], [place] Speech Language Pathologist Office: [phone]  [name] 08/29/2020, 9:34 AM

## 2020-08-29 NOTE — Progress Notes (Addendum)
NAME:  Joshua [name], MRN:  [id number], DOB:  02-21-1981, LOS: 3 ADMISSION DATE:  08/25/2020, CONSULTATION DATE:  08/29/20 REFERRING MD:  EDP CHIEF COMPLAINT:  seizures  Brief History   39 y.o. 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  At baseline he is nonverbal, though is awake and tracks with his eyes.  He was noted to have around 5 breakthrough seizures today at his facility without return to baseline so EMS was called.  There is mention he may have been refusing his Keppra.  He was given Versed in route.  In the ED, CT head with small anterior frontal bleed, possibly larger than previous with multiple hypodensities in the left thalamus, left cerebral and right cerebral white matter concerning for possible PRES. As he was obtunded with hypercarbia, he was intubated for airway protection.  LTM EEG initiated without initial seizure activity.  He was started on Cleviprex for blood pressure control and MRI ordered.  Labs significant for leukocytosis without clear source of infection.  PCCM consulted for admission  Past Medical History  Seizure disorder, [place] Hospital Events   8/30 Admit to PCCM  Consults:  Neurology  Procedures:  8/29 ETT > 9/1  Significant Diagnostic Tests:  8/29 CT head>>1.6 cm focus of intraparenchymal hyperdensity , Increased hypodensity involving the left thalamus, midbrain, brainstem, and cerebellum. Findings are nonspecific, and could be related to seizure/status epilepticus, although the diffuse nature of these findings  would be somewhat unusual for thisentity. Possible PRES could also be considered 8/29 CXR>>Interval development of right lower lobe consolidation, favor Atelectasis. MRI brain 8/30 > Imaging consistent with PRES. Possible encephalitis/cerebritis difficult to exclude. Gyriform diffusion abnormality involving the right parietotemporal cortex and mesial right temporal lobe, most consistent with seizure. Postoperative changes from prior left craniectomy for intraparenchymal hematoma evacuation. Extensive encephalomalacia within the underlying left cerebral hemisphere with protrusion of brain parenchyma through the craniectomy defect. 1.6 cm intraparenchymal hemorrhage at the anterior margin of the herniated left frontal cortex. Multiple scattered chronic micro hemorrhages, most prevalent about the deep gray nuclei and brainstem.  8/31 continuous EEG > no sz. Slowing.   Micro Data:  8/29 Sars-CoV-2>>negative Sputum 8/30 > moderate Strep pneumoniae >>> Sputum 8/30 > Moderate yeast >>> Blood 8/30 >  Antimicrobials:  Zosyn 8/30 > 9/1 CTX 9/1 > 7 day course  Interim history/subjective:  Extubated yesterday and tolerating well.   Objective   Blood pressure (!) 157/120, pulse (!) 114, temperature 98.5 F (36.9 C), temperature source Axillary, resp. rate (!) 27, height 6' (1.829 m), weight 57.4 kg, SpO2 100 %.    Vent Mode: CPAP;PSV FiO2 (%):  [40 %] 40 % PEEP:  [5 cmH20] 5 cmH20 Pressure Support:  [5 cmH20] 5 cmH20   Intake/Output Summary (Last 24 hours) at 08/29/2020 0930 Last data filed at 08/29/2020 0800 Gross per 24 hour  Intake 1716.21 ml  Output 2075 ml  Net -358.79 ml   Filed Weights   08/27/20 0425 08/28/20 0500 08/29/20 0400  Weight: 60.5 kg 59.3 kg 57.4 kg    General: Thin middle aged male in  NAD HEENT: [place]/AT, PERRL, no JVD Neuro: Awake, non-verbal, does not follow commands.   CV: RRR, no MRG PULM: Clear no distress, protecting airway GI: Soft, non-tender,  non-distended Extremities: No acute deformity. No edema.  Skin: Grossly intact.    Resolved Hospital Problem list   Hypercapnic  Respiratory Failure requiring mechanical ventilation.  Assessment & Plan:   Seizure - Neurology has signed off with plans to continue Keppra at current dose. He has also called SNF and addressed the importance of compliance.   Hypertension/PRES: now off clevidipine  - amlodipine, metoprolol - lisinopril and HCTZ discontinued due to serum creatinine bump.  - add back clonidine transdermal - Send BMP due to recent starting of lisinopril  CAP - strep pneumoniae on cultures awaiting sensitivities - Narrow to ceftriaxone - Treat for 7 days for suspected pneumonia.  CKD- mild crt bump after ACE-i and HCTZ started - Repeat BMP  Nutrition - TF - add regular diet - Free water for now until diet picks up.  Transfer out of ICU, will plan to transfer care to [facility].    Best practice:  Diet: NPO Pain/Anxiety/Delirium protocol (if indicated): Propofol/Fentanyl VAP protocol (if indicated): HOB 30 degrees, suction prn DVT prophylaxis: SCD's GI prophylaxis: protonix Glucose control: SSI Mobility: bed rest Code Status: full code Family Communication: Unable to reach family Disposition: ICU > Med -surg  Labs   CBC: Recent Labs  Lab 08/25/20 1946 08/25/20 1946 08/25/20 2240 08/26/20 0239 08/26/20 0317 08/28/20 1040 08/29/20 0444  WBC 17.2*  --   --  12.9*  --  16.3* 12.2*  HGB 14.4   < > 14.6 15.3 16.7 10.2* 11.5*  HCT 47.9   < > 43.0 49.0 49.0 32.4* 37.5*  MCV 84.5  --   --  80.5  --  82.2 81.0  PLT 404*  --   --  351  --  281 301   < > = values in this interval not displayed.    Basic Metabolic Panel: Recent Labs  Lab 08/25/20 1946 08/25/20 1946 08/25/20 2240 08/26/20 0239 08/26/20 0317 08/27/20 0848 08/27/20 1812 08/28/20 0407 08/28/20 1040 08/28/20 1806 08/29/20 0444  NA 137   < > 136 136 139  --   --   --  142  --  144  K 3.3*    < > 3.4* 3.3* 3.7  --   --   --  3.5  --  3.5  CL 96*  --   --  98  --   --   --   --  104  --  106  CO2 19*  --   --  25  --   --   --   --  27  --  29  GLUCOSE 205*  --   --  129*  --   --   --   --  114*  --  98  BUN 19  --   --  20  --   --   --   --  55*  --  42*  CREATININE 1.72*  --   --  1.60*  --   --   --   --  2.65*  --  2.08*  CALCIUM 9.8  --   --  9.8  --   --   --   --  8.8*  --  9.6  MG 2.1   < >  --  2.0  --  1.9 2.0 2.2  --  2.1  --   PHOS  --   --   --  2.8  --  4.1 2.9 2.7  --  1.6*  --    < > = values in this interval not displayed.   GFR: Estimated Creatinine Clearance: 38.3 mL/min (A) (by C-G formula based on SCr of 2.08 mg/dL (H)). Recent Labs  Lab 08/25/20 1946 08/26/20 0239 08/28/20 1040 08/29/20 0444  WBC 17.2* 12.9* 16.3* 12.2*  LATICACIDVEN  --  1.7  --   --     Liver Function Tests: Recent Labs  Lab 08/25/20 1946  AST 22  ALT 11  ALKPHOS 115  BILITOT 0.4  PROT 8.2*  ALBUMIN 3.3*   No results for input(s): LIPASE, AMYLASE in the last 168 hours. No results for input(s): AMMONIA in the last 168 hours.  ABG    Component Value Date/Time   PHART 7.523 (H) 08/26/2020 0317   PCO2ART 33.4 08/26/2020 0317   PO2ART 57 (L) 08/26/2020 0317   HCO3 27.4 08/26/2020 0317   TCO2 28 08/26/2020 0317   ACIDBASEDEF 6.0 (H) 06/24/2020 0347   O2SAT 92.0 08/26/2020 0317     Coagulation Profile: No results for input(s): INR, PROTIME in the last 168 hours.  Cardiac Enzymes: No results for input(s): CKTOTAL, CKMB, CKMBINDEX, TROPONINI in the last 168 hours.  HbA1C: Hgb A1c MFr Bld  Date/Time Value Ref Range Status  08/26/2020 02:39 AM 5.9 (H) 4.8 - 5.6 % Final    Comment:    (NOTE) Pre diabetes:          5.7%-6.4%  Diabetes:              >6.4%  Glycemic control for   <7.0% adults with diabetes   06/24/2020 12:48 AM 6.0 (H) 4.8 - 5.6 % Final    Comment:    (NOTE) Pre diabetes:          5.7%-6.4%  Diabetes:              >6.4%  Glycemic control  for   <7.0% adults with diabetes     CBG: Recent Labs  Lab 08/28/20 1535 08/28/20 1947 08/28/20 2332 08/29/20 0349 08/29/20 0804  GLUCAP 100* 114* 130* 95 88    Critical care time 36 mins   Georgann Housekeeper, AGACNP-BC [place] for personal pager PCCM on call pager [phone]  08/29/2020 9:30 AM    Attending Note:  I have examined patient, reviewed labs, studies and notes.   39 year old man with a history of intercerebral bleed in the past seizure disorder, baseline impaired neurological function after a long complicated hospitalization.  He is reportedly able to track but is nonverbal.  Admitted with breakthrough refractory seizures on 8/31.  CT head consistent with possible PRES.  He required neuro protection ventilation, Cleviprex for blood pressure control.  He was able to be extubated on 9/1. Vitals:   08/29/20 0600 08/29/20 0700 08/29/20 0800 08/29/20 1200  BP: (!) 166/111 (!) 156/124 (!) 157/120   Pulse: 92 100 (!) 114   Resp: (!) 23 (!) 25 (!) 27   Temp:   98.5 F (36.9 C) 100.2 F (37.9 C)  TempSrc:   Axillary Axillary  SpO2: 98% 97% 100%   Weight:      Height:      More active today, moving in the bed, moving both upper extremities.  He has eyes open and appears to track but does not phonate.  Oropharynx clear, no stridor.  Heart regular without a murmur.  Abdomen benign.  Lungs clear bilaterally.  No significant edema.  Respiratory failure in the setting of refractory seizures.  Successfully extubated.  Plan to continue to push his pulmonary hygiene.  Breakthrough seizure activity, probably in part exacerbated by poorly controlled hypertension and PR ES.  Also concerned that his medical compliance has been poor as he tries to refuse medications at SNF.  He is back on Keppra.  Long-term EEG stopped 8/31.  No further seizure activity reported.  Hypertension/PR ES.  Cleviprex has been weaned off.  He is on metoprolol,  amlodipine.  Lisinopril and HCTZ were held due to renal insufficiency.  Acute on chronic kidney disease.  As above ACE inhibitor and HCTZ are on hold.  Follow BMP for normalization.  Plan to transition out of intensive care unit and ask TR[facility] to assume his care as of 9/3  Independent critical care time is 31 minutes.   Baltazar Apo, MD, PhD 08/29/2020, 2:29 PM [facility] [phone] or if no answer [phone]

## 2020-08-29 NOTE — Progress Notes (Signed)
NEUROLOGY CONSULTATION NOTE   Date of service: August 29, 2020 [name] MRN:  [id number] DOB:  05/09/1981  Interval Hx   Extubated, maintains eye contact, localizes in RUE.  Vitals  Temp:  [98.4 F (36.9 C)-100.5 F (38.1 C)] 98.4 F (36.9 C) (09/02 0400) Pulse Rate:  [69-121] 114 (09/02 0800) Resp:  [13-27] 27 (09/02 0800) BP: (109-166)/(85-124) 157/120 (09/02 0800) SpO2:  [93 %-100 %] 100 % (09/02 0800) FiO2 (%):  [40 %] 40 % (09/01 0950) Weight:  [57.4 kg] 57.4 kg (09/02 0400)  Body mass index is 17.16 kg/m.  Physical Exam   Neurologic Examination  Mental status/Cognition: Eyes open. maintains eye contact, tracks my face across the room. Cranial nerves:  Pupils 31mm BL with sluggish and minimal reactivity to light. Intact cough, gag, corneals are positive BL and Occulocephalic positive. No gaze deviation noted.  Motor and sensory: Localizes with RUE with spontaneous movement in BL lower extremities. Contractures in all extremities except RUE.  Labs   Keppra levels obtained at presentation are undetectable.  Imaging and Diagnostic studies  MRI Brain with and without contrast:  1. Patchy multifocal T2/FLAIR signal abnormality involving the left thalamus, brainstem, cerebellum, and right cerebral white matter. Appearance felt to be most consistent with PRES. Possible encephalitis/cerebritis difficult to exclude, although felt to be less likely. Correlation with CSF analysis would be recommended if there is concern for possible intracranial infection. 2. Gyriform diffusion abnormality involving the right parietotemporal cortex and mesial right temporal lobe, most consistent with seizure. 3. Postoperative changes from prior left craniectomy for intraparenchymal hematoma evacuation. Extensive encephalomalacia within the underlying left cerebral hemisphere with protrusion of brain parenchyma through the craniectomy defect. Associated scattered  superficial siderosis as above. 4. 1.6 cm intraparenchymal hemorrhage at the anterior margin of the herniated left frontal cortex, stable from prior CT. 5. Multiple scattered chronic micro hemorrhages, most prevalent about the deep gray nuclei and brainstem, most suggestive of chronic poorly controlled hypertension.  Impression   Joshua [name] is a 39 y.o. male with PMH significant for large ICH requiring hemicraniectomy last year followed by development of seizures, prolonged hospital stay last year of upwards of 200 days, finally discharged to a facility in May 2021, returning again in June with breakthrough seizures and discharged back to the facility, returns again with breakthrough seizures and possible status epilepticus in the setting of medication non compliance at the facility 2/2 patient refusal. Keppra level undetectable at presentation.  I personally discussed with the staff at the facility the importance of compliance with Keppra to prevent him from going into status epilepticus. Also discussed with family.  Recommendations  - continue Keppra 750mg  BID. - some concern for BL cataracts specially with minimally reactive pupils. Would recommend outpatient Ophthalmology followup. Neurology inpatient team will signoff. Please feel free to contact us with any questions or concerns. ______________________________________________________________________   Thank you for the opportunity to take part in the care of this patient. If you have any further questions, please contact the neurology consultation attending.  Signed,  [place] Pager Number [phone]

## 2020-08-30 LAB — CBC
HCT: 40.5 % (ref 39.0–52.0)
Hemoglobin: 12.3 g/dL — ABNORMAL LOW (ref 13.0–17.0)
MCH: 24.5 pg — ABNORMAL LOW (ref 26.0–34.0)
MCHC: 30.4 g/dL (ref 30.0–36.0)
MCV: 80.7 fL (ref 80.0–100.0)
Platelets: 331 10*3/uL (ref 150–400)
RBC: 5.02 MIL/uL (ref 4.22–5.81)
RDW: 16.2 % — ABNORMAL HIGH (ref 11.5–15.5)
WBC: 7.8 10*3/uL (ref 4.0–10.5)
nRBC: 0.5 % — ABNORMAL HIGH (ref 0.0–0.2)

## 2020-08-30 LAB — BASIC METABOLIC PANEL
Anion gap: 13 (ref 5–15)
BUN: 33 mg/dL — ABNORMAL HIGH (ref 6–20)
CO2: 27 mmol/L (ref 22–32)
Calcium: 9.8 mg/dL (ref 8.9–10.3)
Chloride: 106 mmol/L (ref 98–111)
Creatinine, Ser: 1.84 mg/dL — ABNORMAL HIGH (ref 0.61–1.24)
GFR calc Af Amer: 52 mL/min — ABNORMAL LOW (ref >60)
GFR calc non Af Amer: 45 mL/min — ABNORMAL LOW (ref >60)
Glucose, Bld: 125 mg/dL — ABNORMAL HIGH (ref 70–99)
Potassium: 3.1 mmol/L — ABNORMAL LOW (ref 3.5–5.1)
Sodium: 146 mmol/L — ABNORMAL HIGH (ref 135–145)

## 2020-08-30 LAB — GLUCOSE, CAPILLARY
Glucose-Capillary: 115 mg/dL — ABNORMAL HIGH (ref 70–99)
Glucose-Capillary: 116 mg/dL — ABNORMAL HIGH (ref 70–99)
Glucose-Capillary: 126 mg/dL — ABNORMAL HIGH (ref 70–99)
Glucose-Capillary: 64 mg/dL — ABNORMAL LOW (ref 70–99)
Glucose-Capillary: 64 mg/dL — ABNORMAL LOW (ref 70–99)
Glucose-Capillary: 68 mg/dL — ABNORMAL LOW (ref 70–99)
Glucose-Capillary: 125 mg/dL — ABNORMAL HIGH (ref 70–99)

## 2020-08-30 MED ORDER — PROSOURCE TF PO LIQD
45.0000 mL | Freq: Two times a day (BID) | ORAL | Status: DC
  Administered 2020-08-31 – 2020-09-02 (×5): 45 mL
  Filled 2020-08-30 (×5): qty 45

## 2020-08-30 MED ORDER — OSMOLITE 1.5 CAL PO LIQD
1000.0000 mL | ORAL | Status: DC
  Administered 2020-08-30 – 2020-08-31 (×2): 1000 mL
  Filled 2020-08-30 (×6): qty 1000

## 2020-08-30 MED ORDER — POTASSIUM CHLORIDE CRYS ER 20 MEQ PO TBCR: 40.0000 meq | EXTENDED_RELEASE_TABLET | ORAL | Status: DC

## 2020-08-30 MED ORDER — POTASSIUM CHLORIDE 20 MEQ/15ML (10%) PO SOLN
40.0000 meq | Freq: Two times a day (BID) | ORAL | Status: AC
  Administered 2020-08-30 (×2): 40 meq via ORAL
  Filled 2020-08-30 (×2): qty 30

## 2020-08-30 NOTE — Progress Notes (Signed)
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 At baseline patient is nonverbal he was noted to have five breakthrough seizures at the skilled facility without return to baseline so EMS was called. Patient has been refusing Keppra at the facility.  In the ED CT of the head showed small anterior frontal bleed possibly larger than previous with multiple hypodensities in the left thalamus, left cerebral and right cerebral white matter concerning for possible PR ES. Patient was obtunded with hypercarbia so he was intubated for airway protection. LTM EEG was done which did not show seizure activity. He was started on Cleviprex for blood pressure control. He was weaned off Cleviprex. Patient was extubated and transferred to floor.    Subjective   Patient seen and examined, no new complaints.   Assessment/Plan:     1. Seizure-patient presented with breakthrough seizure, he has been noncompliant with Keppra. Neurology signed off. Continue Keppra 750 mg twice daily per tube. Seizure precautions. 2. Acute hypercarbic respiratory failure-insetting of seizure, currently extubated. 3. Hypertension/PR ES-patient was started on clevidipine which has been weaned off. Blood pressures controlled, continue amlodipine, metoprolol. Lisinopril and HCTZ have been discontinued due to elevated creatinine. Continue clonidine 0.3 mg transdermal weekly. 4. Community acquired pneumonia-patient was started on vancomycin, Zosyn. Antibiotics have been narrowed down to ceftriaxone. Blood cultures x2 are negative to date. 5. Hypokalemia-potassium is 3.1, will replace potassium and follow BMP in am. 6. Acute kidney injury on CKD stage IIIa-patient baseline  creatinine is around 1.5. He presented with elevated creatinine likely from HCTZ and lisinopril. Both these medications have been discontinued. Creatinine improved to 1.84 today. Very close to baseline. Follow BMP in am. 7. Nutrition-swallow valuation done, patient started on regular diet with thin liquids. Patient get medication through PEG tube. Also getting Prosource tube feeds 45 mill twice daily      COVID-19 Labs  No results for input(s): DDIMER, FERRITIN, LDH, CRP in the last 72 hours.  Lab Results  Component Value Date   SARSCOV2NAA NEGATIVE 08/25/2020   SARSCOV2NAA NEGATIVE 07/04/2020   [place] NEGATIVE 06/23/2020   [place] NEGATIVE 05/01/2020     Scheduled medications:   . amLODipine  10 mg Per Tube q AM  . baclofen  10 mg Per Tube QHS  . chlorhexidine  15 mL Mouth Rinse BID  . Chlorhexidine Gluconate Cloth  6 each Topical Daily  . cloNIDine  0.3 mg Transdermal Weekly  . [START ON 08/31/2020] feeding supplement (PROSource TF)  45 mL Per Tube BID  . free water  200 mL Per Tube Q6H  . insulin aspart  0-15 Units Subcutaneous Q4H  . levETIRAcetam  750 mg Per Tube BID  . mouth rinse  15 mL Mouth Rinse q12n4p  . metoprolol tartrate  50 mg Per Tube BID  . potassium chloride  40 mEq Oral BID         CBG: Recent Labs  Lab 08/29/20 1935 08/29/20 2351 08/30/20 0446 08/30/20 0749 08/30/20 1217  GLUCAP 104* 122* 115* 116* 126*    SpO2: 99 % O2 Flow Rate (L/min): 4 L/min FiO2 (%): 40 %    CBC: Recent Labs  Lab 08/25/20 1946 08/25/20 2240 08/26/20 0239 08/26/20 0317 08/28/20 1040 08/29/20 0444 08/30/20 0446  WBC 17.2*  --  12.9*  --  16.3* 12.2* 7.8  HGB 14.4   < > 15.3 16.7 10.2* 11.5* 12.3*  HCT 47.9   < > 49.0 49.0 32.4* 37.5* 40.5  MCV 84.5  --  80.5  --  82.2 81.0 80.7  PLT 404*  --  351  --  281 301 331   < > = values in this interval not displayed.    Basic Metabolic Panel: Recent Labs  Lab 08/25/20 1946 08/25/20 2240 08/26/20 0239  08/26/20 0317 08/27/20 0848 08/27/20 1812 08/28/20 0407 08/28/20 1040 08/28/20 1806 08/29/20 0444 08/30/20 0446  NA 137   < > 136 139  --   --   --  142  --  144 146*  K 3.3*   < > 3.3* 3.7  --   --   --  3.5  --  3.5 3.1*  CL 96*  --  98  --   --   --   --  104  --  106 106  CO2 19*  --  25  --   --   --   --  27  --  29 27  GLUCOSE 205*  --  129*  --   --   --   --  114*  --  98 125*  BUN 19  --  20  --   --   --   --  55*  --  42* 33*  CREATININE 1.72*  --  1.60*  --   --   --   --  2.65*  --  2.08* 1.84*  CALCIUM 9.8  --  9.8  --   --   --   --  8.8*  --  9.6 9.8  MG 2.1   < > 2.0  --  1.9 2.0 2.2  --  2.1  --   --   PHOS  --   --  2.8  --  4.1 2.9 2.7  --  1.6*  --   --    < > = values in this interval not displayed.     Liver Function Tests: Recent Labs  Lab 08/25/20 1946  AST 22  ALT 11  ALKPHOS 115  BILITOT 0.4  PROT 8.2*  ALBUMIN 3.3*     Antibiotics: Anti-infectives (From admission, onward)   Start     Dose/Rate Route Frequency Ordered Stop   08/28/20 1200  cefTRIAXone (ROCEPHIN) 2 g in sodium chloride 0.9 % 100 mL IVPB        2 g 200 mL/hr over 30 Minutes Intravenous Every 24 hours 08/28/20 1046     08/26/20 0800  vancomycin (VANCOREADY) IVPB 1250 mg/250 mL  Status:  Discontinued        1,250 mg 166.7 mL/hr over 90 Minutes Intravenous Every 48 hours 08/26/20 0721 08/26/20 0901   08/26/20 0800  piperacillin-tazobactam (ZOSYN) IVPB 3.375 g  Status:  Discontinued        3.375 g 12.5 mL/hr over 240 Minutes Intravenous Every 8 hours 08/26/20 0721 08/28/20 1046       DVT prophylaxis: SCDs  Code Status: Full code  Family Communication: No family at bedside    Status is: Inpatient  Dispo: The patient is from: Skilled nursing facility              Anticipated d/c is to: Skilled nursing facility              Anticipated d/c date is: 09/02/2020  Patient currently not medically stable for discharge  Barrier to discharge-treatment for  pneumonia,  Pressure Injury 06/25/20 Heel Right Unstageable - Full thickness tissue loss in which the base of the injury is covered by slough (yellow, tan, gray, green or brown) and/or eschar (tan, brown or black) in the wound bed. (Active)  06/25/20   Location: Heel  Location Orientation: Right  Staging: Unstageable - Full thickness tissue loss in which the base of the injury is covered by slough (yellow, tan, gray, green or brown) and/or eschar (tan, brown or black) in the wound bed.  Wound Description (Comments):   Present on Admission:          Consultants:  PCCM  Neurology  Procedures:     Objective   Vitals:   08/29/20 2348 08/30/20 0449 08/30/20 0746 08/30/20 0746  BP: (!) 123/96 (!) 129/111 (!) 136/123 (!) 136/123  Pulse: 87 95 95 95  Resp: 18 18 20 20   Temp: 98.7 F (37.1 C) 98.3 F (36.8 C) 98.6 F (37 C) 98.6 F (37 C)  TempSrc:      SpO2: 96% 100%  99%  Weight:      Height:        Intake/Output Summary (Last 24 hours) at 08/30/2020 1421 Last data filed at 08/30/2020 7672 Gross per 24 hour  Intake 1200 ml  Output 1200 ml  Net 0 ml    09/01 1901 - 09/03 0700 In: 2040  Out: 0947 [[id number]]  Filed Weights   08/27/20 0425 08/28/20 0500 08/29/20 0400  Weight: 60.5 kg 59.3 kg 57.4 kg    Physical Examination:   Alert, nonverbal, refused to be examined   Data Reviewed:   Recent Results (from the past 240 hour(s))  SARS Coronavirus 2 by RT PCR (hospital order, performed in [facility] hospital lab) Nasopharyngeal Nasopharyngeal Swab     Status: None   Collection Time: 08/25/20  7:44 PM   Specimen: Nasopharyngeal Swab  Result Value Ref Range Status   SARS Coronavirus 2 NEGATIVE NEGATIVE Final    Comment: (NOTE) SARS-CoV-2 target nucleic acids are NOT DETECTED.  The SARS-CoV-2 RNA is generally detectable in upper and lower respiratory specimens during the acute phase of infection. The lowest concentration of SARS-CoV-2 viral copies this  assay can detect is 250 copies / mL. A negative result does not preclude SARS-CoV-2 infection and should not be used as the sole basis for treatment or other patient management decisions.  A negative result may occur with improper specimen collection / handling, submission of specimen other than nasopharyngeal swab, presence of viral mutation(s) within the areas targeted by this assay, and inadequate number of viral copies (<250 copies / mL). A negative result must be combined with clinical observations, patient history, and epidemiological information.  Fact Sheet for Patients:   StrictlyIdeas.no  Fact Sheet for Healthcare Providers: BankingDealers.co.za  This test is not yet approved or  cleared by the [place] FDA and has been authorized for detection and/or diagnosis of SARS-CoV-2 by FDA under an Emergency Use Authorization (EUA).  This EUA will remain in effect (meaning this test can be used) for the duration of the COVID-19 declaration under Section 564(b)(1) of the Act, 21 U.S.C. section 360bbb-3(b)(1), unless the authorization is terminated or revoked sooner.  Performed at [facility], [place] [address], [place], [place] [address]   Culture, respiratory (non-expectorated)     Status: None (Preliminary result)   Collection Time: 08/26/20  1:11 AM   Specimen: Tracheal  Aspirate; Respiratory  Result Value Ref Range Status   Specimen Description TRACHEAL ASPIRATE  Final   Special Requests NONE  Final   Gram Stain   Final    ABUNDANT WBC PRESENT, PREDOMINANTLY PMN MODERATE GRAM POSITIVE COCCI MODERATE YEAST    Culture   Final    MODERATE STREPTOCOCCUS PNEUMONIAE REATTEMPTING ORGANISM ISOLATION Performed at [facility], [address] [place], [place] [address]    Report Status PENDING  Incomplete  Culture, blood (routine x 2)     Status: None (Preliminary result)   Collection Time: 08/26/20  2:43 AM    Specimen: BLOOD  Result Value Ref Range Status   Specimen Description BLOOD SITE NOT SPECIFIED  Final   Special Requests   Final    BOTTLES DRAWN AEROBIC AND ANAEROBIC Blood Culture adequate volume   Culture   Final    NO GROWTH 4 DAYS Performed at [facility], [address], [place], [place] [address]    Report Status PENDING  Incomplete  Culture, blood (routine x 2)     Status: None (Preliminary result)   Collection Time: 08/26/20  2:43 AM   Specimen: BLOOD  Result Value Ref Range Status   Specimen Description BLOOD SITE NOT SPECIFIED  Final   Special Requests   Final    BOTTLES DRAWN AEROBIC AND ANAEROBIC Blood Culture results may not be optimal due to an excessive volume of blood received in culture bottles   Culture   Final    NO GROWTH 4 DAYS Performed at [facility], [place] [address], [place], [place] [address]    Report Status PENDING  Incomplete  MRSA PCR Screening     Status: None   Collection Time: 08/26/20  4:39 AM   Specimen: Nasopharyngeal  Result Value Ref Range Status   MRSA by PCR NEGATIVE NEGATIVE Final    Comment:        The GeneXpert MRSA Assay (FDA approved for NASAL specimens only), is one component of a comprehensive MRSA colonization surveillance program. It is not intended to diagnose MRSA infection nor to guide or monitor treatment for MRSA infections. Performed at [facility], [place] [address] [place], [place] [address]   Culture, blood (routine x 2)     Status: None (Preliminary result)   Collection Time: 08/26/20 11:46 AM   Specimen: BLOOD LEFT HAND  Result Value Ref Range Status   Specimen Description BLOOD LEFT HAND  Final   Special Requests   Final    BOTTLES DRAWN AEROBIC ONLY Blood Culture adequate volume   Culture   Final    NO GROWTH 4 DAYS Performed at [facility], [place] [address] [place], [place] [address]    Report Status PENDING  Incomplete  Culture, blood (routine x 2)     Status: None (Preliminary  result)   Collection Time: 08/26/20 11:47 AM   Specimen: BLOOD RIGHT ARM  Result Value Ref Range Status   Specimen Description BLOOD RIGHT ARM  Final   Special Requests   Final    BOTTLES DRAWN AEROBIC ONLY Blood Culture adequate volume   Culture   Final    NO GROWTH 4 DAYS Performed at [facility], [place] [address] [place], [place] [address]    Report Status PENDING  Incomplete         Oswald Hillock   [facility] If 7PM-7AM, please contact night-coverage at [email or web address], Office  [phone]  08/30/2020, 2:21 PM  LOS: 4 days

## 2020-08-30 NOTE — Progress Notes (Signed)
Nutrition Follow-up  DOCUMENTATION CODES:   Non-severe (moderate) malnutrition in context of chronic illness, Underweight  INTERVENTION:   -D/c Vital AF 1.2  Initiate Osmolite 1.5 @ 55 ml/hr via PEG  45 ml Prosource TF BID  200 ml free water every 6 hours  Tube feeding regimen provides 2002 kcal (100% of needs), 110 grams of protein, and 914 ml of H2O. Total free water: 1714 ml/ day  NUTRITION DIAGNOSIS:   Moderate Malnutrition related to chronic illness (sz disorder with lengthy hospitalization) as evidenced by mild fat depletion, mild muscle depletion.  Ongoing  GOAL:   Patient will meet greater than or equal to 90% of their needs  Met with TF  MONITOR:   Diet advancement, PO intake, Labs, Weight trends, TF tolerance, Skin, I & O's  REASON FOR ASSESSMENT:   Consult Enteral/tube feeding initiation and management  ASSESSMENT:   Pt with PMH of seizure disorder and ICH in 08/2019 s/p hemicraniectomy, PEG placement with prolonged hospital stay with d/c to SNF 04/2020. Pt admitted with breakthrough seizures with report of refusing medications.  9/1- extubated, s/p BSE- refused but advanced to regular diet with thin liquids  Reviewed I/O's: +740 ml x 24 hours and +1.5 L since admission  UOP: 700 ml x 24 hours  Pt lying in bed at time of visit. He did not interact with RD at time of visit.   Observed breakfast tray, which is untouched. Plan to continue TF for now, as pt is refusing PO's. Pt also refused BSE from SLP yesterday.   Vital AF 1.2 is infusing at 60 ml/hr with PEG tube (also receiving 200 ml free water flushes every 6 hours). Complete TF regimen providing 1728 kcals, 108 grams protein, and 1167 ml free water daily. Total free water: 1967 ml daily. Will transition to standard formula now that pt has been extubated and transitioned out of ICU.   Plan to discharge back to [facility] once medically stable.   Labs reviewed: CBGS: 90-127 (inpatient orders for  glycemic control are 0-15 units inuslin aspart every 4 hours).   Diet Order:   Diet Order            Diet regular Room service appropriate? Yes; Fluid consistency: Thin  Diet effective now                 EDUCATION NEEDS:   No education needs have been identified at this time  Skin:  Skin Assessment: Reviewed RN Assessment  Last BM:  08/29/20  Height:   Ht Readings from Last 1 Encounters:  08/26/20 6' (1.829 m)    Weight:   Wt Readings from Last 1 Encounters:  08/29/20 57.4 kg    BMI:  Body mass index is 17.16 kg/m.  Estimated Nutritional Needs:   Kcal:  1800-2000  Protein:  100-115 grams  Fluid:  > 1.8 L    Loistine Chance, RD, LDN, [place] Registered Dietitian II Certified Diabetes Care and Education Specialist Please refer to [facility] for RD and/or RD on-call/weekend/after hours pager

## 2020-08-30 NOTE — Progress Notes (Signed)
Hypoglycemic Event  CBG: 64 @1616   Treatment: 4 oz juice/soda  Symptoms: None  Follow-up CBG: Time: 1637 CBG Result: 68  Possible Reasons for Event: Other: Tube feeding stopped for bath, and feed change   Comments/MD notified:Lama, MD    Anda Kraft

## 2020-08-30 NOTE — Progress Notes (Signed)
Joshua Saas, MD that patient Potassium 3.1 and Sodium 146.

## 2020-08-30 NOTE — Evaluation (Signed)
Clinical/Bedside Swallow Evaluation Patient Details  Name: Joshua [name] MRN: [id number] Date of Birth: 10-09-1981  Today's Date: 08/30/2020 Time: SLP Start Time (ACUTE ONLY): 1350 SLP Stop Time (ACUTE ONLY): 1413 SLP Time Calculation (min) (ACUTE ONLY): 23 min  Past Medical History: No past medical history on file. Past Surgical History:  Past Surgical History:  Procedure Laterality Date  . CRANIOTOMY Left 09/09/2019   Procedure: DECOMPRESSIVE CRANIECTOMY AND  HEMATOMA EVACUATION, IMPLANTATION OF SKULL FLAP IN RIGHT ABDOMEN;  Surgeon: Erline Levine, MD;  Location: [facility];  Service: Neurosurgery;  Laterality: Left;  . ESOPHAGOGASTRODUODENOSCOPY (EGD) WITH PROPOFOL N/A 09/28/2019   Procedure: ESOPHAGOGASTRODUODENOSCOPY (EGD) WITH PROPOFOL;  Surgeon: Georganna Skeans, MD;  Location: [facility] ENDOSCOPY;  Service: General;  Laterality: N/A;  . PEG PLACEMENT N/A 09/28/2019   Procedure: PERCUTANEOUS ENDOSCOPIC GASTROSTOMY (PEG) PLACEMENT;  Surgeon: Georganna Skeans, MD;  Location: [facility] ENDOSCOPY;  Service: General;  Laterality: N/A;   HPI:  39yo male admitted 08/27/20 with multiple breakthrough seizures. Intubated for hypercarbia and airway protection. Extubated 9/1. PMH: seizures, ICH (08/2019), hemicarniectomy. CXR 8/30 = BLL collapse or consolidation. 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 No evidence for acute vascular infarct.   Assessment / Plan / Recommendation Clinical Impression  Pt continues to demonstrate refusal of PO, consistent with prior assessments. Pt was positioned upright, POs placed on tray. Pt picked up lidded cup several times, attending to it, but never taking a sip, always putting it back down. Pt was siphoned some coke with a straw and he spit it out. One swallow observed when pt agreed to one total assist bite of ice cream. He then grimaced and waved everything else away. Pt can continue to be offered regular solids and thin liquids consistent with prior assessment, but  intake may be variable and at times non existant. No SLP f/u warranted at this time.  SLP Visit Diagnosis: Dysphagia, unspecified (R13.10)    Aspiration Risk  Risk for inadequate nutrition/hydration    Diet Recommendation Regular;Thin liquid   Liquid Administration via: Cup;Straw Medication Administration: Via alternative means Supervision: Staff to assist with self feeding    Other  Recommendations Oral Care Recommendations: Oral care BID   Follow up Recommendations 24 hour supervision/assistance      Frequency and Duration            Prognosis        Swallow Study   General Date of Onset: 08/26/20 HPI: 39yo male admitted 08/27/20 with multiple breakthrough seizures. Intubated for hypercarbia and airway protection. Extubated 9/1. PMH: seizures, ICH (08/2019), hemicarniectomy. CXR 8/30 = BLL collapse or consolidation. 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 No evidence for acute vascular infarct. Type of Study: Bedside Swallow Evaluation Previous Swallow Assessment: most recent MBS 06/28/20 = reg/thin, no pen/asp. Pills whole in puree Diet Prior to this Study: Regular;Thin liquids Temperature Spikes Noted: No Respiratory Status: Room air History of Recent Intubation: No Date extubated: 08/28/20 Behavior/Cognition: Alert;Doesn't follow directions;Uncooperative Oral Cavity Assessment: Within Functional Limits Oral Care Completed by SLP: No Oral Cavity - Dentition: Poor condition Self-Feeding Abilities: Refused PO;Total assist Patient Positioning: Upright in bed Baseline Vocal Quality: Not observed    Oral/Motor/Sensory Function Overall Oral Motor/Sensory Function: Within functional limits   Ice Chips     Thin Liquid Thin Liquid: Impaired Presentation: Straw Other Comments: pt spit out coke    Nectar Thick Nectar Thick Liquid: Not tested   Honey Thick Honey Thick Liquid: Not tested   Puree Puree: Within functional limits Presentation: Spoon  Solid     Solid: Not tested     Herbie Baltimore, MA CCC-SLP  Acute Rehabilitation Services Pager [phone] Office [phone]  Lynann Beaver 08/30/2020,2:13 PM

## 2020-08-30 NOTE — Progress Notes (Signed)
CSW spoke with [place] at [facility].  Pt is resident there and can return.  Does have family support. Winferd Humphrey, MSW, LCSW 08/30/2020 9:35 AM

## 2020-08-31 LAB — CULTURE, RESPIRATORY W GRAM STAIN

## 2020-08-31 LAB — GLUCOSE, CAPILLARY
Glucose-Capillary: 106 mg/dL — ABNORMAL HIGH (ref 70–99)
Glucose-Capillary: 112 mg/dL — ABNORMAL HIGH (ref 70–99)
Glucose-Capillary: 120 mg/dL — ABNORMAL HIGH (ref 70–99)
Glucose-Capillary: 125 mg/dL — ABNORMAL HIGH (ref 70–99)
Glucose-Capillary: 128 mg/dL — ABNORMAL HIGH (ref 70–99)
Glucose-Capillary: 133 mg/dL — ABNORMAL HIGH (ref 70–99)
Glucose-Capillary: 143 mg/dL — ABNORMAL HIGH (ref 70–99)
Glucose-Capillary: 78 mg/dL (ref 70–99)

## 2020-08-31 LAB — CULTURE, BLOOD (ROUTINE X 2)
Culture: NO GROWTH
Culture: NO GROWTH
Culture: NO GROWTH
Culture: NO GROWTH
Special Requests: ADEQUATE
Special Requests: ADEQUATE
Special Requests: ADEQUATE

## 2020-08-31 LAB — BASIC METABOLIC PANEL
Anion gap: 9 (ref 5–15)
BUN: 30 mg/dL — ABNORMAL HIGH (ref 6–20)
CO2: 26 mmol/L (ref 22–32)
Calcium: 9.8 mg/dL (ref 8.9–10.3)
Chloride: 109 mmol/L (ref 98–111)
Creatinine, Ser: 1.56 mg/dL — ABNORMAL HIGH (ref 0.61–1.24)
GFR calc Af Amer: >60 mL/min (ref >60)
GFR calc non Af Amer: 55 mL/min — ABNORMAL LOW (ref >60)
Glucose, Bld: 111 mg/dL — ABNORMAL HIGH (ref 70–99)
Potassium: 4.2 mmol/L (ref 3.5–5.1)
Sodium: 144 mmol/L (ref 135–145)

## 2020-08-31 NOTE — Progress Notes (Signed)
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 At baseline patient is nonverbal he was noted to have five breakthrough seizures at the skilled facility without return to baseline so EMS was called. Patient has been refusing Keppra at the facility.  In the ED CT of the head showed small anterior frontal bleed possibly larger than previous with multiple hypodensities in the left thalamus, left cerebral and right cerebral white matter concerning for possible PR ES. Patient was obtunded with hypercarbia so he was intubated for airway protection. LTM EEG was done which did not show seizure activity. He was started on Cleviprex for blood pressure control. He was weaned off Cleviprex. Patient was extubated and transferred to floor.    Subjective   Patient seen, had hypoglycemia this morning after tube feeding was stopped for bath and food change.  He was given juice/soda.  Blood glucose improved.  Last CBG was 120 at 12:42 PM   Assessment/Plan:     1. Seizure-patient presented with breakthrough seizure, he has been noncompliant with Keppra. Neurology signed off. Continue Keppra 750 mg twice daily per tube. Seizure precautions. 2. Acute hypercarbic respiratory failure-insetting of seizure, resolved, patient was extubated.   3. Hypertension/PR ES-patient was started on clevidipine which has been weaned off. Blood pressures controlled, continue amlodipine, metoprolol. Lisinopril and HCTZ have been discontinued due to elevated creatinine. Continue clonidine 0.3 mg transdermal weekly. 4. Community acquired pneumonia-patient was started on vancomycin, Zosyn. Antibiotics have been narrowed down to ceftriaxone. Blood cultures x2 are negative to  date. 5. Hypokalemia-replete 6. Acute kidney injury on CKD stage IIIa-patient baseline creatinine is around 1.5. He presented with elevated creatinine likely from HCTZ and lisinopril. Both these medications have been discontinued. Creatinine improved to 1.56. 7. Nutrition-swallow valuation done, patient started on regular diet with thin liquids. Patient get medication through PEG tube. Also getting Prosource tube feeds 45 mill twice daily      COVID-19 Labs  No results for input(s): DDIMER, FERRITIN, LDH, CRP in the last 72 hours.  Lab Results  Component Value Date   SARSCOV2NAA NEGATIVE 08/25/2020   SARSCOV2NAA NEGATIVE 07/04/2020   [place] NEGATIVE 06/23/2020   [place] NEGATIVE 05/01/2020     Scheduled medications:   . amLODipine  10 mg Per Tube q AM  . baclofen  10 mg Per Tube QHS  . chlorhexidine  15 mL Mouth Rinse BID  . Chlorhexidine Gluconate Cloth  6 each Topical Daily  . cloNIDine  0.3 mg Transdermal Weekly  . feeding supplement (PROSource TF)  45 mL Per Tube BID  . free water  200 mL Per Tube Q6H  . insulin aspart  0-15 Units Subcutaneous Q4H  . levETIRAcetam  750 mg Per Tube BID  . mouth rinse  15 mL Mouth Rinse q12n4p  . metoprolol tartrate  50 mg Per Tube BID         CBG: Recent Labs  Lab 08/30/20 2105 08/31/20 0029 08/31/20 0436 08/31/20 0747 08/31/20 1242  GLUCAP 125* 125* 143* 133* 120*    SpO2: 100 % O2 Flow Rate (L/min): 4 L/min FiO2 (%): 40 %    CBC: Recent Labs  Lab 08/25/20 1946 08/25/20 2240 08/26/20 0239 08/26/20 0317 08/28/20 1040 08/29/20 0444 08/30/20 0446  WBC 17.2*  --  12.9*  --  16.3* 12.2* 7.8  HGB 14.4   < > 15.3 16.7 10.2* 11.5* 12.3*  HCT 47.9   < > 49.0 49.0 32.4* 37.5* 40.5  MCV 84.5  --  80.5  --  82.2 81.0 80.7  PLT 404*  --  351  --  281 301 331   < > = values in this interval not displayed.    Basic Metabolic Panel: Recent Labs  Lab 08/26/20 0239 08/26/20 0239 08/26/20 0317 08/27/20 0848  08/27/20 1812 08/28/20 0407 08/28/20 1040 08/28/20 1806 08/29/20 0444 08/30/20 0446 08/31/20 0106  NA 136   < > 139  --   --   --  142  --  144 146* 144  K 3.3*   < > 3.7  --   --   --  3.5  --  3.5 3.1* 4.2  CL 98  --   --   --   --   --  104  --  106 106 109  CO2 25  --   --   --   --   --  27  --  29 27 26   GLUCOSE 129*  --   --   --   --   --  114*  --  98 125* 111*  BUN 20  --   --   --   --   --  55*  --  42* 33* 30*  CREATININE 1.60*  --   --   --   --   --  2.65*  --  2.08* 1.84* 1.56*  CALCIUM 9.8  --   --   --   --   --  8.8*  --  9.6 9.8 9.8  MG 2.0  --   --  1.9 2.0 2.2  --  2.1  --   --   --   PHOS 2.8  --   --  4.1 2.9 2.7  --  1.6*  --   --   --    < > = values in this interval not displayed.     Liver Function Tests: Recent Labs  Lab 08/25/20 1946  AST 22  ALT 11  ALKPHOS 115  BILITOT 0.4  PROT 8.2*  ALBUMIN 3.3*     Antibiotics: Anti-infectives (From admission, onward)   Start     Dose/Rate Route Frequency Ordered Stop   08/28/20 1200  cefTRIAXone (ROCEPHIN) 2 g in sodium chloride 0.9 % 100 mL IVPB        2 g 200 mL/hr over 30 Minutes Intravenous Every 24 hours 08/28/20 1046     08/26/20 0800  vancomycin (VANCOREADY) IVPB 1250 mg/250 mL  Status:  Discontinued        1,250 mg 166.7 mL/hr over 90 Minutes Intravenous Every 48 hours 08/26/20 0721 08/26/20 0901   08/26/20 0800  piperacillin-tazobactam (ZOSYN) IVPB 3.375 g  Status:  Discontinued        3.375 g 12.5 mL/hr over 240 Minutes Intravenous Every 8 hours 08/26/20 0721 08/28/20 1046       DVT prophylaxis: SCDs  Code Status: Full code  Family Communication: No family at bedside    Status is: Inpatient  Dispo: The patient is from: Skilled nursing facility              Anticipated d/c is to: Skilled nursing facility              Anticipated d/c date is: 09/02/2020  Patient currently not medically stable for discharge  Barrier to discharge-treatment for  pneumonia,  Pressure Injury 06/25/20 Heel Right Unstageable - Full thickness tissue loss in which the base of the injury is covered by slough (yellow, tan, gray, green or brown) and/or eschar (tan, brown or black) in the wound bed. (Active)  06/25/20   Location: Heel  Location Orientation: Right  Staging: Unstageable - Full thickness tissue loss in which the base of the injury is covered by slough (yellow, tan, gray, green or brown) and/or eschar (tan, brown or black) in the wound bed.  Wound Description (Comments):   Present on Admission:          Consultants:  PCCM  Neurology  Procedures:     Objective   Vitals:   08/31/20 0300 08/31/20 0745 08/31/20 1044 08/31/20 1240  BP:  (!) 144/110 (!) 141/114 (!) 144/108  Pulse:  91 97 90  Resp:  18  20  Temp:  97.8 F (36.6 C)  98.4 F (36.9 C)  TempSrc:      SpO2:  98% 98% 100%  Weight: 59.7 kg     Height:        Intake/Output Summary (Last 24 hours) at 08/31/2020 1459 Last data filed at 08/31/2020 1013 Gross per 24 hour  Intake --  Output 901 ml  Net -901 ml    09/02 1901 - 09/04 0700 In: 240  Out: 1400 [Urine:1400]  Filed Weights   08/28/20 0500 08/29/20 0400 08/31/20 0300  Weight: 59.3 kg 57.4 kg 59.7 kg    Physical Examination:  Alert, nonverbal, refused to be examined   Data Reviewed:   Recent Results (from the past 240 hour(s))  SARS Coronavirus 2 by RT PCR (hospital order, performed in [facility] hospital lab) Nasopharyngeal Nasopharyngeal Swab     Status: None   Collection Time: 08/25/20  7:44 PM   Specimen: Nasopharyngeal Swab  Result Value Ref Range Status   SARS Coronavirus 2 NEGATIVE NEGATIVE Final    Comment: (NOTE) SARS-CoV-2 target nucleic acids are NOT DETECTED.  The SARS-CoV-2 RNA is generally detectable in upper and lower respiratory specimens during the acute phase of infection. The lowest concentration of SARS-CoV-2 viral copies this assay can detect is 250 copies / mL. A  negative result does not preclude SARS-CoV-2 infection and should not be used as the sole basis for treatment or other patient management decisions.  A negative result may occur with improper specimen collection / handling, submission of specimen other than nasopharyngeal swab, presence of viral mutation(s) within the areas targeted by this assay, and inadequate number of viral copies (<250 copies / mL). A negative result must be combined with clinical observations, patient history, and epidemiological information.  Fact Sheet for Patients:   StrictlyIdeas.no  Fact Sheet for Healthcare Providers: BankingDealers.co.za  This test is not yet approved or  cleared by the [place] FDA and has been authorized for detection and/or diagnosis of SARS-CoV-2 by FDA under an Emergency Use Authorization (EUA).  This EUA will remain in effect (meaning this test can be used) for the duration of the COVID-19 declaration under Section 564(b)(1) of the Act, 21 U.S.C. section 360bbb-3(b)(1), unless the authorization is terminated or revoked sooner.  Performed at [facility], [place] [address] [place], [place] [address]   Culture, respiratory (non-expectorated)     Status: None   Collection Time: 08/26/20  1:11 AM   Specimen: Tracheal Aspirate; Respiratory  Result Value Ref Range Status  Specimen Description TRACHEAL ASPIRATE  Final   Special Requests NONE  Final   Gram Stain   Final    ABUNDANT WBC PRESENT, PREDOMINANTLY PMN MODERATE GRAM POSITIVE COCCI MODERATE YEAST Performed at [facility], [place] [address], [place], [place] [address]    Culture MODERATE STREPTOCOCCUS PNEUMONIAE  Final   Report Status 08/31/2020 FINAL  Final   Organism ID, Bacteria STREPTOCOCCUS PNEUMONIAE  Final      Susceptibility   Streptococcus pneumoniae - MIC*    ERYTHROMYCIN <=0.12 SENSITIVE Sensitive     LEVOFLOXACIN 0.5 SENSITIVE Sensitive      VANCOMYCIN 0.5 SENSITIVE Sensitive     PENICILLIN (meningitis) <=0.06 SENSITIVE Sensitive     PENO - penicillin <=0.06      PENICILLIN (non-meningitis) <=0.06 SENSITIVE Sensitive     PENICILLIN (oral) <=0.06 SENSITIVE Sensitive     CEFTRIAXONE (non-meningitis) 0.25 SENSITIVE Sensitive     CEFTRIAXONE (meningitis) 0.25 SENSITIVE Sensitive     * MODERATE STREPTOCOCCUS PNEUMONIAE  Culture, blood (routine x 2)     Status: None (Preliminary result)   Collection Time: 08/26/20  2:43 AM   Specimen: BLOOD  Result Value Ref Range Status   Specimen Description BLOOD SITE NOT SPECIFIED  Final   Special Requests   Final    BOTTLES DRAWN AEROBIC AND ANAEROBIC Blood Culture adequate volume   Culture   Final    NO GROWTH 4 DAYS Performed at [facility], [address], [place], [place] [address]    Report Status PENDING  Incomplete  Culture, blood (routine x 2)     Status: None (Preliminary result)   Collection Time: 08/26/20  2:43 AM   Specimen: BLOOD  Result Value Ref Range Status   Specimen Description BLOOD SITE NOT SPECIFIED  Final   Special Requests   Final    BOTTLES DRAWN AEROBIC AND ANAEROBIC Blood Culture results may not be optimal due to an excessive volume of blood received in culture bottles   Culture   Final    NO GROWTH 4 DAYS Performed at [facility], [place] [address] [place], [place] [address]    Report Status PENDING  Incomplete  MRSA PCR Screening     Status: None   Collection Time: 08/26/20  4:39 AM   Specimen: Nasopharyngeal  Result Value Ref Range Status   MRSA by PCR NEGATIVE NEGATIVE Final    Comment:        The GeneXpert MRSA Assay (FDA approved for NASAL specimens only), is one component of a comprehensive MRSA colonization surveillance program. It is not intended to diagnose MRSA infection nor to guide or monitor treatment for MRSA infections. Performed at [facility], [place] [address] [place], [place] [address]   Culture, blood (routine  x 2)     Status: None (Preliminary result)   Collection Time: 08/26/20 11:46 AM   Specimen: BLOOD LEFT HAND  Result Value Ref Range Status   Specimen Description BLOOD LEFT HAND  Final   Special Requests   Final    BOTTLES DRAWN AEROBIC ONLY Blood Culture adequate volume   Culture   Final    NO GROWTH 4 DAYS Performed at [facility], [place] [address] [place], [place] [address]    Report Status PENDING  Incomplete  Culture, blood (routine x 2)     Status: None (Preliminary result)   Collection Time: 08/26/20 11:47 AM   Specimen: BLOOD RIGHT ARM  Result Value Ref Range  Status   Specimen Description BLOOD RIGHT ARM  Final   Special Requests   Final    BOTTLES DRAWN AEROBIC ONLY Blood Culture adequate volume   Culture   Final    NO GROWTH 4 DAYS Performed at [facility], [address], [place], [place] [address]    Report Status PENDING  Incomplete         Oswald Hillock   [facility] If 7PM-7AM, please contact night-coverage at [email or web address], Office  [phone]   08/31/2020, 2:59 PM  LOS: 5 days

## 2020-09-01 LAB — GLUCOSE, CAPILLARY
Glucose-Capillary: 100 mg/dL — ABNORMAL HIGH (ref 70–99)
Glucose-Capillary: 103 mg/dL — ABNORMAL HIGH (ref 70–99)
Glucose-Capillary: 109 mg/dL — ABNORMAL HIGH (ref 70–99)
Glucose-Capillary: 115 mg/dL — ABNORMAL HIGH (ref 70–99)
Glucose-Capillary: 120 mg/dL — ABNORMAL HIGH (ref 70–99)
Glucose-Capillary: 126 mg/dL — ABNORMAL HIGH (ref 70–99)

## 2020-09-01 NOTE — Progress Notes (Signed)
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 At baseline patient is nonverbal he was noted to have five breakthrough seizures at the skilled facility without return to baseline so EMS was called. Patient has been refusing Keppra at the facility.  In the ED CT of the head showed small anterior frontal bleed possibly larger than previous with multiple hypodensities in the left thalamus, left cerebral and right cerebral white matter concerning for possible PR ES. Patient was obtunded with hypercarbia so he was intubated for airway protection. LTM EEG was done which did not show seizure activity. He was started on Cleviprex for blood pressure control. He was weaned off Cleviprex. Patient was extubated and transferred to floor.    Subjective   Patient seen and examined, no new complaints.   Assessment/Plan:     1. Seizure-patient presented with breakthrough seizure, he has been noncompliant with Keppra. Neurology signed off. Continue Keppra 750 mg twice daily per tube. Seizure precautions. 2. Acute hypercarbic respiratory failure-insetting of seizure, resolved, patient was extubated.   3. Hypertension/PR ES-patient was started on clevidipine which has been weaned off. Blood pressures controlled, continue amlodipine, metoprolol. Lisinopril and HCTZ have been discontinued due to elevated creatinine. Continue clonidine 0.3 mg transdermal weekly. 4. Community acquired pneumonia-patient was started on vancomycin, Zosyn. Antibiotics have been narrowed down to ceftriaxone. Blood cultures x2 are negative to date. 5. Hypokalemia-replete 6. Acute kidney injury on CKD stage IIIa-patient baseline creatinine is around 1.5. He presented with  elevated creatinine likely from HCTZ and lisinopril. Both these medications have been discontinued. Creatinine improved to 1.56. 7. Nutrition-swallow valuation done, patient started on regular diet with thin liquids. Patient get medication through PEG tube. Also getting Prosource tube feeds 45 mill twice daily      COVID-19 Labs  No results for input(s): DDIMER, FERRITIN, LDH, CRP in the last 72 hours.  Lab Results  Component Value Date   SARSCOV2NAA NEGATIVE 08/25/2020   SARSCOV2NAA NEGATIVE 07/04/2020   [place] NEGATIVE 06/23/2020   [place] NEGATIVE 05/01/2020     Scheduled medications:   . amLODipine  10 mg Per Tube q AM  . baclofen  10 mg Per Tube QHS  . chlorhexidine  15 mL Mouth Rinse BID  . Chlorhexidine Gluconate Cloth  6 each Topical Daily  . cloNIDine  0.3 mg Transdermal Weekly  . feeding supplement (PROSource TF)  45 mL Per Tube BID  . free water  200 mL Per Tube Q6H  . insulin aspart  0-15 Units Subcutaneous Q4H  . levETIRAcetam  750 mg Per Tube BID  . mouth rinse  15 mL Mouth Rinse q12n4p  . metoprolol tartrate  50 mg Per Tube BID         CBG: Recent Labs  Lab 08/31/20 1821 08/31/20 1950 08/31/20 2334 09/01/20 0320 09/01/20 0728  GLUCAP 78 106* 128* 115* 126*    SpO2: 96 % O2 Flow Rate (L/min): 4 L/min FiO2 (%): 40 %    CBC: Recent Labs  Lab 08/25/20 1946 08/25/20 2240 08/26/20 0239 08/26/20 0317 08/28/20 1040 08/29/20 0444 08/30/20 0446  WBC 17.2*  --  12.9*  --  16.3* 12.2* 7.8  HGB 14.4   < > 15.3 16.7 10.2* 11.5* 12.3*  HCT 47.9   < >  49.0 49.0 32.4* 37.5* 40.5  MCV 84.5  --  80.5  --  82.2 81.0 80.7  PLT 404*  --  351  --  281 301 331   < > = values in this interval not displayed.    Basic Metabolic Panel: Recent Labs  Lab 08/26/20 0239 08/26/20 0239 08/26/20 0317 08/27/20 0848 08/27/20 1812 08/28/20 0407 08/28/20 1040 08/28/20 1806 08/29/20 0444 08/30/20 0446 08/31/20 0106  NA 136   < > 139  --   --   --   142  --  144 146* 144  K 3.3*   < > 3.7  --   --   --  3.5  --  3.5 3.1* 4.2  CL 98  --   --   --   --   --  104  --  106 106 109  CO2 25  --   --   --   --   --  27  --  29 27 26   GLUCOSE 129*  --   --   --   --   --  114*  --  98 125* 111*  BUN 20  --   --   --   --   --  55*  --  42* 33* 30*  CREATININE 1.60*  --   --   --   --   --  2.65*  --  2.08* 1.84* 1.56*  CALCIUM 9.8  --   --   --   --   --  8.8*  --  9.6 9.8 9.8  MG 2.0  --   --  1.9 2.0 2.2  --  2.1  --   --   --   PHOS 2.8  --   --  4.1 2.9 2.7  --  1.6*  --   --   --    < > = values in this interval not displayed.     Liver Function Tests: Recent Labs  Lab 08/25/20 1946  AST 22  ALT 11  ALKPHOS 115  BILITOT 0.4  PROT 8.2*  ALBUMIN 3.3*     Antibiotics: Anti-infectives (From admission, onward)   Start     Dose/Rate Route Frequency Ordered Stop   08/28/20 1200  cefTRIAXone (ROCEPHIN) 2 g in sodium chloride 0.9 % 100 mL IVPB        2 g 200 mL/hr over 30 Minutes Intravenous Every 24 hours 08/28/20 1046     08/26/20 0800  vancomycin (VANCOREADY) IVPB 1250 mg/250 mL  Status:  Discontinued        1,250 mg 166.7 mL/hr over 90 Minutes Intravenous Every 48 hours 08/26/20 0721 08/26/20 0901   08/26/20 0800  piperacillin-tazobactam (ZOSYN) IVPB 3.375 g  Status:  Discontinued        3.375 g 12.5 mL/hr over 240 Minutes Intravenous Every 8 hours 08/26/20 0721 08/28/20 1046       DVT prophylaxis: SCDs  Code Status: Full code  Family Communication: No family at bedside    Status is: Inpatient  Dispo: The patient is from: Skilled nursing facility              Anticipated d/c is to: Skilled nursing facility              Anticipated d/c date is: 09/02/2020              Patient currently not medically stable for discharge  Barrier to discharge-treatment for pneumonia,  Pressure  Injury 06/25/20 Heel Right Unstageable - Full thickness tissue loss in which the base of the injury is covered by slough (yellow, tan,  gray, green or brown) and/or eschar (tan, brown or black) in the wound bed. (Active)  06/25/20   Location: Heel  Location Orientation: Right  Staging: Unstageable - Full thickness tissue loss in which the base of the injury is covered by slough (yellow, tan, gray, green or brown) and/or eschar (tan, brown or black) in the wound bed.  Wound Description (Comments):   Present on Admission:      Consultants:  PCCM  Neurology    Objective   Vitals:   08/31/20 1700 08/31/20 2157 09/01/20 0323 09/01/20 0758  BP:  (!) 137/109  (!) 137/112  Pulse:  89  86  Resp:  18  19  Temp:  98.6 F (37 C)  97.8 F (36.6 C)  TempSrc:  Axillary    SpO2: 100% 98%  96%  Weight:   60 kg   Height:        Intake/Output Summary (Last 24 hours) at 09/01/2020 1129 Last data filed at 09/01/2020 0500 Gross per 24 hour  Intake 2431.58 ml  Output 750 ml  Net 1681.58 ml    09/03 1901 - 09/05 0700 In: 2431.6  Out: 2423 [Urine:1650]  Filed Weights   08/29/20 0400 08/31/20 0300 09/01/20 0323  Weight: 57.4 kg 59.7 kg 60 kg    Physical Examination:  General-appears in no acute distress Heart-S1-S2, regular, no murmur auscultated Lungs-clear to auscultation bilaterally, no wheezing or crackles auscultated Abdomen-soft, nontender, no organomegaly Extremities-no edema in the lower extremities Neuro-alert, nonverbal, not following commands   Data Reviewed:   Recent Results (from the past 240 hour(s))  SARS Coronavirus 2 by RT PCR (hospital order, performed in [facility] hospital lab) Nasopharyngeal Nasopharyngeal Swab     Status: None   Collection Time: 08/25/20  7:44 PM   Specimen: Nasopharyngeal Swab  Result Value Ref Range Status   SARS Coronavirus 2 NEGATIVE NEGATIVE Final    Comment: (NOTE) SARS-CoV-2 target nucleic acids are NOT DETECTED.  The SARS-CoV-2 RNA is generally detectable in upper and lower respiratory specimens during the acute phase of infection. The lowest concentration of  SARS-CoV-2 viral copies this assay can detect is 250 copies / mL. A negative result does not preclude SARS-CoV-2 infection and should not be used as the sole basis for treatment or other patient management decisions.  A negative result may occur with improper specimen collection / handling, submission of specimen other than nasopharyngeal swab, presence of viral mutation(s) within the areas targeted by this assay, and inadequate number of viral copies (<250 copies / mL). A negative result must be combined with clinical observations, patient history, and epidemiological information.  Fact Sheet for Patients:   StrictlyIdeas.no  Fact Sheet for Healthcare Providers: BankingDealers.co.za  This test is not yet approved or  cleared by the [place] FDA and has been authorized for detection and/or diagnosis of SARS-CoV-2 by FDA under an Emergency Use Authorization (EUA).  This EUA will remain in effect (meaning this test can be used) for the duration of the COVID-19 declaration under Section 564(b)(1) of the Act, 21 U.S.C. section 360bbb-3(b)(1), unless the authorization is terminated or revoked sooner.  Performed at [facility], [place] [address] [place], [place] [address]   Culture, respiratory (non-expectorated)     Status: None   Collection Time: 08/26/20  1:11 AM   Specimen: Tracheal Aspirate; Respiratory  Result  Value Ref Range Status   Specimen Description TRACHEAL ASPIRATE  Final   Special Requests NONE  Final   Gram Stain   Final    ABUNDANT WBC PRESENT, PREDOMINANTLY PMN MODERATE GRAM POSITIVE COCCI MODERATE YEAST Performed at [facility], [place] [address] [place], [place] [address]    Culture MODERATE STREPTOCOCCUS PNEUMONIAE  Final   Report Status 08/31/2020 FINAL  Final   Organism ID, Bacteria STREPTOCOCCUS PNEUMONIAE  Final      Susceptibility   Streptococcus pneumoniae - MIC*    ERYTHROMYCIN <=0.12  SENSITIVE Sensitive     LEVOFLOXACIN 0.5 SENSITIVE Sensitive     VANCOMYCIN 0.5 SENSITIVE Sensitive     PENICILLIN (meningitis) <=0.06 SENSITIVE Sensitive     PENO - penicillin <=0.06      PENICILLIN (non-meningitis) <=0.06 SENSITIVE Sensitive     PENICILLIN (oral) <=0.06 SENSITIVE Sensitive     CEFTRIAXONE (non-meningitis) 0.25 SENSITIVE Sensitive     CEFTRIAXONE (meningitis) 0.25 SENSITIVE Sensitive     * MODERATE STREPTOCOCCUS PNEUMONIAE  Culture, blood (routine x 2)     Status: None   Collection Time: 08/26/20  2:43 AM   Specimen: BLOOD  Result Value Ref Range Status   Specimen Description BLOOD SITE NOT SPECIFIED  Final   Special Requests   Final    BOTTLES DRAWN AEROBIC AND ANAEROBIC Blood Culture adequate volume   Culture   Final    NO GROWTH 5 DAYS Performed at [facility], [address], [place], [place] [address]    Report Status 08/31/2020 FINAL  Final  Culture, blood (routine x 2)     Status: None   Collection Time: 08/26/20  2:43 AM   Specimen: BLOOD  Result Value Ref Range Status   Specimen Description BLOOD SITE NOT SPECIFIED  Final   Special Requests   Final    BOTTLES DRAWN AEROBIC AND ANAEROBIC Blood Culture results may not be optimal due to an excessive volume of blood received in culture bottles   Culture   Final    NO GROWTH 5 DAYS Performed at [facility], [place] [address] [place], [place] [address]    Report Status 08/31/2020 FINAL  Final  MRSA PCR Screening     Status: None   Collection Time: 08/26/20  4:39 AM   Specimen: Nasopharyngeal  Result Value Ref Range Status   MRSA by PCR NEGATIVE NEGATIVE Final    Comment:        The GeneXpert MRSA Assay (FDA approved for NASAL specimens only), is one component of a comprehensive MRSA colonization surveillance program. It is not intended to diagnose MRSA infection nor to guide or monitor treatment for MRSA infections. Performed at [facility], [place] [address] [place], [place]  [address]   Culture, blood (routine x 2)     Status: None   Collection Time: 08/26/20 11:46 AM   Specimen: BLOOD LEFT HAND  Result Value Ref Range Status   Specimen Description BLOOD LEFT HAND  Final   Special Requests   Final    BOTTLES DRAWN AEROBIC ONLY Blood Culture adequate volume   Culture   Final    NO GROWTH 5 DAYS Performed at [facility], [place] [address], [place], [place] [address]    Report Status 08/31/2020 FINAL  Final  Culture, blood (routine x 2)     Status: None   Collection Time: 08/26/20 11:47 AM   Specimen: BLOOD RIGHT ARM  Result Value Ref  Range Status   Specimen Description BLOOD RIGHT ARM  Final   Special Requests   Final    BOTTLES DRAWN AEROBIC ONLY Blood Culture adequate volume   Culture   Final    NO GROWTH 5 DAYS Performed at [facility], [address], [place], [place] [address]    Report Status 08/31/2020 FINAL  Final         Oswald Hillock   [facility] If 7PM-7AM, please contact night-coverage at [email or web address], Office  [phone]   09/01/2020, 11:29 AM  LOS: 6 days

## 2020-09-02 LAB — GLUCOSE, CAPILLARY
Glucose-Capillary: 141 mg/dL — ABNORMAL HIGH (ref 70–99)
Glucose-Capillary: 103 mg/dL — ABNORMAL HIGH (ref 70–99)
Glucose-Capillary: 136 mg/dL — ABNORMAL HIGH (ref 70–99)

## 2020-09-02 LAB — SARS CORONAVIRUS 2 BY RT PCR (HOSPITAL ORDER, PERFORMED IN ~~LOC~~ HOSPITAL LAB): SARS Coronavirus 2: NEGATIVE

## 2020-09-02 MED ORDER — LORAZEPAM 2 MG/ML PO CONC: 1.0000 mg | Freq: Every day | ORAL | 0 refills | Status: AC | PRN

## 2020-09-02 MED ORDER — METOPROLOL TARTRATE 50 MG PO TABS: 50.0000 mg | ORAL_TABLET | Freq: Two times a day (BID) | ORAL | Status: AC

## 2020-09-02 NOTE — Discharge Summary (Signed)
Physician Discharge Summary  Joshua [name] RWE:[id number] DOB: 04-18-1981 DOA: 08/25/2020  PCP: Patient, [name]  Admit date: 08/25/2020 Discharge date: 09/02/2020  Time spent: 50 minutes  Recommendations for Outpatient Follow-up:  Continue Keppra for seizures  Discharge Diagnoses:  Active Problems:   Acute respiratory failure with hypoxia and hypercapnia (HCC)   Seizure (HCC)   PRES (posterior reversible encephalopathy syndrome)   Discharge Condition: Stable  Diet recommendation: Tube feeding  Filed Weights   08/31/20 0300 09/01/20 0323 09/02/20 0300  Weight: 59.7 kg 60 kg 61 kg    History of present illness:  39 year old male with medical history of seizures, 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 At baseline patient is nonverbal he was noted to have five breakthrough seizures at the skilled facility without return to baseline so EMS was called. Patient has been refusing Keppra at the facility.  In the ED CT of the head showed small anterior frontal bleed possibly larger than previous with multiple hypodensities in the left thalamus, left cerebral and right cerebral white matter concerning for possible PR ES. Patient was obtunded with hypercarbia so he was intubated for airway protection. LTM EEG was done which did not show seizure activity. He was started on Cleviprex for blood pressure control. He was weaned off Cleviprex. Patient was extubated and transferred to floor.  Hospital Course:   1. Seizure-patient presented with breakthrough seizure, he has been noncompliant with Keppra. Neurology signed off. Continue Keppra 750 mg twice daily per tube. Seizure precautions. 2. Acute hypercarbic respiratory failure-insetting of seizure, resolved, patient was extubated.   3. Hypertension/PR ES-patient was started on clevidipine which has been weaned off. Blood pressures controlled, continue  amlodipine, metoprolol. Lisinopril and HCTZ have been discontinued due to elevated creatinine. Continue clonidine 0.3 mg transdermal weekly. 4. Community acquired pneumonia-patient was started on vancomycin, Zosyn. Antibiotics have been narrowed down to ceftriaxone.  He has completed 5 days of ceftriaxone.  Blood cultures x2 are negative to date. 5. Hypokalemia-replete 6. Acute kidney injury on CKD stage IIIa-patient baseline creatinine is around 1.5. He presented with elevated creatinine likely from HCTZ and lisinopril. Both these medications have been discontinued. Creatinine improved to 1.56. 7. Nutrition-swallow valuation done, patient started on regular diet with thin liquids. Patient get medication through PEG tube.  Continue tube feeding  Procedures:    Consultations:  PCCM  Neurology  Discharge Exam: Vitals:   09/01/20 2140 09/02/20 0745  BP: (!) 134/107 (!) 127/101  Pulse: 80 89  Resp: 18 16  Temp: 98.4 F (36.9 C) (!) 96 F (35.6 C)  SpO2: 98% 99%    General: Appears in no acute distress Cardiovascular: S1-S2, regular, no murmur auscultated Respiratory: Clear to auscultation bilaterally  Discharge Instructions   Discharge Instructions    Diet - low sodium heart healthy   Complete by: As directed    Increase activity slowly   Complete by: As directed      Allergies as of 09/02/2020   No Known Allergies     Medication List    TAKE these medications   acetaminophen 325 MG tablet Commonly known as: TYLENOL Place 2 tablets (650 mg total) into feeding tube every 4 (four) hours as needed for mild pain (temp > 100.5). What changed:   when to take this  additional instructions   amLODipine 10 MG tablet Commonly known as: NORVASC Place 10 mg into feeding tube in the morning.  baclofen 10 MG tablet Commonly known as: LIORESAL Place 10 mg into feeding tube at bedtime.   cloNIDine 0.3 mg/24hr patch Commonly known as: CATAPRES - Dosed in mg/24 hr Place 1  patch (0.3 mg total) onto the skin once a week.   feeding supplement (JEVITY 1.2 CAL) Liqd Place 474 mLs into feeding tube in the morning, at noon, in the evening, and at bedtime.   feeding supplement (PRO-STAT SUGAR FREE 64) Liqd Place 30 mLs into feeding tube 4 (four) times daily.   free water Soln Place 200 mLs into feeding tube every 4 (four) hours. What changed:   how much to take  when to take this  additional instructions   levETIRAcetam 100 MG/ML solution Commonly known as: KEPPRA Place 7.5 mLs (750 mg total) into feeding tube 2 (two) times daily.   LORazepam 2 MG/ML concentrated solution Commonly known as: ATIVAN Place 0.5 mLs (1 mg total) into feeding tube daily as needed for seizure. What changed: how to take this   magnesium hydroxide 400 MG/5ML suspension Commonly known as: MILK OF MAGNESIA Place 30 mLs into feeding tube every 12 (twelve) hours as needed for mild constipation ("for 48 hours").   metoprolol tartrate 50 MG tablet Commonly known as: LOPRESSOR Place 1 tablet (50 mg total) into feeding tube 2 (two) times daily. What changed:   medication strength  how much to take   multivitamin with minerals Tabs tablet Take 1 tablet by mouth daily.   NON FORMULARY Take by mouth See admin instructions. Magic Cup Frozen Desserts- One cup by mouth two times a day- with lunch and supper   polyethylene glycol 17 g packet Commonly known as: MIRALAX / GLYCOLAX Place 17 g into feeding tube 2 (two) times daily. What changed:   when to take this  additional instructions      No Known Allergies  Contact information for after-discharge care    Destination    [facility] .   Service: Skilled Nursing Contact information: [address] [place] [phone]                   The results of significant diagnostics from this hospitalization (including imaging, microbiology, ancillary and laboratory) are listed  below for reference.    Significant Diagnostic Studies: EEG  Result Date: 08/25/2020 Joshua Havens, MD     08/25/2020 11:02 PM [name] MRN: [id number] Epilepsy Attending: Lora [name] Referring Physician/Provider: Dr Amie Portland Date: 8/29/201 Duration: 27.14 mins Patient history: 39 year old male with history of seizure and prior left ICH s/p hemi-crani who presented after multiple breakthrough seizures. EEG to evaluate for seizure Level of alertness: lethargic AEDs during EEG study: Ativan, lev Technical aspects: This EEG study was done with scalp electrodes positioned according to the 10-20 International system of electrode placement. Electrical activity was acquired at a sampling rate of 500Hz  and reviewed with a high frequency filter of 70Hz  and a low frequency filter of 1Hz . EEG data were recorded continuously and digitally stored. Description:  EEG showed continuous generalized and lateralized left hemisphere 3 to 6 Hz theta-delta slowing admixed with generalized 15-18Hz  beta activity. Hyperventilation and photic stimulation were not performed.   Of note, this study was technically difficult due to significant movement and electrode artifact ABNORMALITY -Excessive beta, generalized -Continuous slow, generalized, lateralized left hemisphere IMPRESSION: This technically difficult study is suggestive of cortical dysfunction in left hemisphere due to underlying encephalomalacia, post ictal state. Additionally, there  is evidence of severe diffuse encephalopathy, nonspecific etiology but likely related to sedation. No seizures or epileptiform discharges were seen throughout the recording. Joshua [name]   DG Abd 1 View  Result Date: 08/25/2020 CLINICAL DATA:  Intubated, gastrostomy tube EXAM: ABDOMEN - 1 VIEW COMPARISON:  03/01/2020 FINDINGS: Two supine frontal views of the abdomen and pelvis are obtained. Percutaneous gastrostomy tube overlies left upper quadrant. Bowel gas  pattern is unremarkable. No masses or abnormal calcifications. No acute bony abnormalities. IMPRESSION: 1. Unremarkable bowel gas pattern. Electronically Signed   By: Randa Ngo M.D.   On: 08/25/2020 23:27   CT HEAD WO CONTRAST  Result Date: 08/25/2020 CLINICAL DATA:  Initial evaluation for acute seizure. EXAM: CT HEAD WITHOUT CONTRAST TECHNIQUE: Contiguous axial images were obtained from the base of the skull through the vertex without intravenous contrast. COMPARISON:  Comparison made with prior head CT from 06/23/2020. FINDINGS: Brain: Postoperative changes from prior left craniectomy for intraparenchymal hematoma evacuation again seen. Extensive encephalomalacia again seen throughout the left cerebral hemisphere, with brain parenchyma protruding through the craniectomy defect, worsened from prior exam. Associated ex vacuo dilatation of the left lateral ventricle. Small chronic subdural hygromas at the level of the craniectomy defect measure up to 6 mm, similar to previous (series 5, image 40). Additionally, there is a 1.6 cm focus of intraparenchymal hyperdensity at the anterior margin of the herniated left frontal cortex, most consistent with hemorrhage. A similar hemorrhage with seen at this location on 06/23/2020, although this is increased in size as compared to prior. Given the degree of hyperdensity, this is favored to reflect a new hemorrhage from prior. In addition, there is increased hypodensity seen involving the left thalamus extending into the left greater than right midbrain and brainstem. Increased hypodensity extends into the cerebellum bilaterally. Increased confluent hypodensity seen throughout the white matter of the right centrum semi ovale as well. Findings are nonspecific, and could reflect changes related to seizure. Possible PRES could also be considered. Additionally, there is question of subtle parenchymal hypodensity involving the posterior right parietal cortex with subtle  blurring of the gray-white matter differentiation (., Nonspecific, but could reflect changes related to seizure or ischemia No other acute intracranial hemorrhage. No other definite large vessel territory infarct. No mass lesion or other extra-axial fluid collection. No significant midline shift. Vascular: No asymmetric hyperdense vessel. Intracranial circulation diffusely dolichoectatic in appearance. Skull: Prior left hemi craniectomy. No visible scalp soft tissue injury. Sinuses/Orbits: Globes and orbital soft tissues demonstrate no acute finding. Mild scattered mucosal thickening noted within the ethmoidal air cells. Nasal trumpet in place. Mastoid air cells are clear. Other: None. IMPRESSION: 1. Postoperative changes from prior left hemi craniectomy for intraparenchymal hematoma evacuation. Extensive encephalomalacia throughout the left cerebral hemisphere, with brain parenchyma protruding through the craniectomy defect, worsened from prior. 2. 1.6 cm focus of intraparenchymal hyperdensity at the anterior margin of the herniated left frontal cortex, new/increased in size from previous. 3. Increased hypodensity involving the left thalamus, left greater than right midbrain, brainstem, and cerebellum, with additional confluent and somewhat worsened hypodensity throughout the white matter of the right centrum semi ovale. Findings are nonspecific, and could be related to seizure/status epilepticus, although the diffuse nature of these findings would be somewhat unusual for this entity. Possible PRES could also be considered. Nonspecific inflammation/encephalitis could also be considered in the correct clinical setting. 4. Question subtle parenchymal hypodensity involving the right parietal cortex. Changes related to seizure and/or PRES favored, although possible ischemia could also  be considered. Critical Value/emergent results were called by telephone at the time of interpretation on 08/25/2020 at 8:49 pm to  provider MICHAEL BUTLER as well as Dr. Rory Percy, Who verbally acknowledged these results. Electronically Signed   By: Jeannine Boga M.D.   On: 08/25/2020 21:10   MR BRAIN W WO CONTRAST  Result Date: 08/26/2020 CLINICAL DATA:  Initial evaluation for acute seizure, hypertensive emergency. EXAM: MRI HEAD WITHOUT AND WITH CONTRAST TECHNIQUE: Multiplanar, multiecho pulse sequences of the brain and surrounding structures were obtained without and with intravenous contrast. CONTRAST:  80mL GADAVIST GADOBUTROL 1 MMOL/ML IV SOLN COMPARISON:  Comparison made with prior CT from 08/25/2020 as well as multiple previous studies. FINDINGS: Brain: Postoperative changes from prior left craniectomy for evacuation of left cerebral intraparenchymal hematoma again seen. Extensive encephalomalacia and gliosis seen throughout the underlying left cerebral hemisphere with associated scattered areas of chronic hemosiderin staining. Brain parenchyma protrudes through the craniectomy defect. Small simple appearing subdural collections at the craniectomy site measure up to 5-6 mm. Acute intraparenchymal hemorrhage measuring approximately 1.6 cm again seen at the anterior margin of the craniectomy site, stable from prior CT. Multiple scattered foci of T2/FLAIR hyperintensity seen involving the left thalamus, left greater than right midbrain, with inferior extension into the medulla and visualized upper brainstem. Patchy involvement of the both cerebellar hemispheres. Additionally, prominent T2/FLAIR signal intensity seen involving the cerebral white matter of the right centrum semi ovale. No associated hemorrhage or significant enhancement. Findings are nonspecific, but favored to reflect changes related to PRES. Nonspecific encephalitis/cerebritis difficult to exclude, and could also be considered. Gyriform diffusion abnormality involving the right parietotemporal cortex is seen (series 5, image 86, 75). Patchy involvement of the right  thalamus and mesial right temporal lobe/right hippocampus. Findings felt to be most consistent with changes related to seizure/status epilepticus. No significant mass effect. No evidence for acute vascular infarct. Multiple scattered chronic micro hemorrhages are seen, most pronounced about the brainstem and deep gray nuclei, likely related to poorly controlled hypertension. 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 No other mass lesion or significant mass effect. Ex vacuo dilatation of the left lateral ventricle without hydrocephalus. Mild asymmetric left a meningeal enhancement involving the right temporoparietal region favored to be reactive in nature due to underlying seizure. No other abnormal enhancement. Pituitary gland and suprasellar region within normal limits. Midline structures intact. Vascular: Major intracranial vascular flow voids are maintained. Diffuse dolichoectatic appearance of the intracranial circulation noted, suggesting chronic underlying hypertension. Skull and upper cervical spine: Craniocervical junction within normal limits. Upper cervical spine normal. Bone marrow signal intensity within normal limits. Prior left craniectomy. No other scalp soft tissue abnormality. Sinuses/Orbits: Globes and orbital soft tissues demonstrate no acute finding. Scattered mucosal thickening noted within the sphenoid ethmoidal sinuses. Fluid seen layering within the nasopharynx and posterior nasal passages. Patient is intubated. No significant mastoid effusion. Other: None. IMPRESSION: 1. Patchy multifocal T2/FLAIR signal abnormality involving the left thalamus, brainstem, cerebellum, and right cerebral white matter. Appearance felt to be most consistent with PRES. Possible encephalitis/cerebritis difficult to exclude, although felt to be less likely. Correlation with CSF analysis would be  recommended if there is concern for possible intracranial infection. 2. Gyriform diffusion abnormality involving the right parietotemporal cortex and mesial right temporal lobe, most consistent with seizure. 3. Postoperative changes from prior left craniectomy for intraparenchymal hematoma evacuation. Extensive encephalomalacia within the underlying left cerebral hemisphere with protrusion of  brain parenchyma through the craniectomy defect. Associated scattered superficial siderosis as above. 4. 1.6 cm intraparenchymal hemorrhage at the anterior margin of the herniated left frontal cortex, stable from prior CT. 5. Multiple scattered chronic micro hemorrhages, most prevalent about the deep gray nuclei and brainstem, most suggestive of chronic poorly controlled hypertension. Electronically Signed   By: Jeannine Boga M.D.   On: 08/26/2020 02:01   DG CHEST PORT 1 VIEW  Result Date: 08/26/2020 CLINICAL DATA:  Pneumothorax EXAM: PORTABLE CHEST 1 VIEW COMPARISON:  Yesterday FINDINGS: Endotracheal tube with tip between the clavicular heads and carina. The orogastric tube tip reaches the upper stomach with side port over the lower esophagus. Hazy and streaky opacities at the lung bases. No visible pneumothorax, limited at the right apex due to overlapping hardware. Normal heart size. IMPRESSION: 1. Bilateral lower lobe collapse or consolidation. 2. Orogastric tube with tip over the stomach and side-port over the esophagus. Electronically Signed   By: Monte Fantasia M.D.   On: 08/26/2020 05:50   DG Chest Portable 1 View  Result Date: 08/25/2020 CLINICAL DATA:  Intubated EXAM: PORTABLE CHEST 1 VIEW COMPARISON:  08/25/2020 FINDINGS: Single frontal view of the chest demonstrates endotracheal tube overlying tracheal air column, tip at level of thoracic inlet. The cardiac silhouette is unremarkable. Increased density at the right lung base with obscuration of the right hemidiaphragm consistent with right lower lobe  consolidation, likely atelectasis. No effusion or pneumothorax. No acute bony abnormalities. IMPRESSION: 1. Interval development of right lower lobe consolidation, favor atelectasis. 2. Otherwise no complication after intubation. Electronically Signed   By: Randa Ngo M.D.   On: 08/25/2020 23:28   DG Chest Port 1 View  Result Date: 08/25/2020 CLINICAL DATA:  Shortness of breath. EXAM: PORTABLE CHEST 1 VIEW COMPARISON:  Radiograph 06/27/2020 FINDINGS: Improved opacities in the right hemithorax from prior exam. Mild residual at the right lung base. Left lung is clear. Stable heart size and mediastinal contours. No pleural effusion or pneumothorax. No pulmonary edema. No acute osseous abnormalities are seen. IMPRESSION: Improved opacities in the right hemithorax from July 1st exam. Mild residual atelectasis at the right lung base. Electronically Signed   By: Keith Rake M.D.   On: 08/25/2020 20:28   Overnight EEG with video  Result Date: 08/26/2020 Joshua Havens, MD     08/27/2020  9:06 AM [name] MRN: [id number] Epilepsy Attending: Lora [name] Referring Physician/Provider: Dr Amie Portland Duration:  08/25/2020 2307 to 08/26/2020 2307  Patient history: 39 year old male with history of seizure and prior left ICH s/p hemi-crani who presented after multiple breakthrough seizures. EEG to evaluate for seizure  Level of alertness: lethargic  AEDs during EEG study: Ativan, lev  Technical aspects: This EEG study was done with scalp electrodes positioned according to the 10-20 International system of electrode placement. Electrical activity was acquired at a sampling rate of 500Hz  and reviewed with a high frequency filter of 70Hz  and a low frequency filter of 1Hz . EEG data were recorded continuously and digitally stored.  Description:  EEG showed continuous generalized and maximal bifrontal, rhythmic 2 to 3 Hz delta slowing.  Generalized and lateralized left hemisphere periodic  epileptiform discharges  at 1 Hz were also noted initially in the study. Hyperventilation and photic stimulation were not performed.    Of note, parts of this study was technically difficult due to significant movement and electrode artifact  ABNORMALITY -Periodic epileptiform discharges, generalized and lateralized left hemisphere -Continuous rhythmic delta  slow, generalized  IMPRESSION: This technically difficult study is suggestive of generalized epileptogenicity, seen predominantly in the left hemisphere likely secondary to underlying encephalomalacia.  Additionally, there is evidence of moderate to  severe diffuse encephalopathy, nonspecific etiology.  No seizures were seen during the study.  Joshua [name]    Microbiology: Recent Results (from the past 240 hour(s))  SARS Coronavirus 2 by RT PCR (hospital order, performed in [facility] hospital lab) Nasopharyngeal Nasopharyngeal Swab     Status: None   Collection Time: 08/25/20  7:44 PM   Specimen: Nasopharyngeal Swab  Result Value Ref Range Status   SARS Coronavirus 2 NEGATIVE NEGATIVE Final    Comment: (NOTE) SARS-CoV-2 target nucleic acids are NOT DETECTED.  The SARS-CoV-2 RNA is generally detectable in upper and lower respiratory specimens during the acute phase of infection. The lowest concentration of SARS-CoV-2 viral copies this assay can detect is 250 copies / mL. A negative result does not preclude SARS-CoV-2 infection and should not be used as the sole basis for treatment or other patient management decisions.  A negative result may occur with improper specimen collection / handling, submission of specimen other than nasopharyngeal swab, presence of viral mutation(s) within the areas targeted by this assay, and inadequate number of viral copies (<250 copies / mL). A negative result must be combined with clinical observations, patient history, and epidemiological information.  Fact Sheet for Patients:    StrictlyIdeas.no  Fact Sheet for Healthcare Providers: BankingDealers.co.za  This test is not yet approved or  cleared by the [place] FDA and has been authorized for detection and/or diagnosis of SARS-CoV-2 by FDA under an Emergency Use Authorization (EUA).  This EUA will remain in effect (meaning this test can be used) for the duration of the COVID-19 declaration under Section 564(b)(1) of the Act, 21 U.S.C. section 360bbb-3(b)(1), unless the authorization is terminated or revoked sooner.  Performed at [facility], [place] [address], [place], [place] [address]   Culture, respiratory (non-expectorated)     Status: None   Collection Time: 08/26/20  1:11 AM   Specimen: Tracheal Aspirate; Respiratory  Result Value Ref Range Status   Specimen Description TRACHEAL ASPIRATE  Final   Special Requests NONE  Final   Gram Stain   Final    ABUNDANT WBC PRESENT, PREDOMINANTLY PMN MODERATE GRAM POSITIVE COCCI MODERATE YEAST Performed at [facility], [place] [address] [place], [place] [address]    Culture MODERATE STREPTOCOCCUS PNEUMONIAE  Final   Report Status 08/31/2020 FINAL  Final   Organism ID, Bacteria STREPTOCOCCUS PNEUMONIAE  Final      Susceptibility   Streptococcus pneumoniae - MIC*    ERYTHROMYCIN <=0.12 SENSITIVE Sensitive     LEVOFLOXACIN 0.5 SENSITIVE Sensitive     VANCOMYCIN 0.5 SENSITIVE Sensitive     PENICILLIN (meningitis) <=0.06 SENSITIVE Sensitive     PENO - penicillin <=0.06      PENICILLIN (non-meningitis) <=0.06 SENSITIVE Sensitive     PENICILLIN (oral) <=0.06 SENSITIVE Sensitive     CEFTRIAXONE (non-meningitis) 0.25 SENSITIVE Sensitive     CEFTRIAXONE (meningitis) 0.25 SENSITIVE Sensitive     * MODERATE STREPTOCOCCUS PNEUMONIAE  Culture, blood (routine x 2)     Status: None   Collection Time: 08/26/20  2:43 AM   Specimen: BLOOD  Result Value Ref Range Status   Specimen Description BLOOD SITE NOT  SPECIFIED  Final   Special Requests   Final    BOTTLES DRAWN AEROBIC AND ANAEROBIC Blood Culture adequate  volume   Culture   Final    NO GROWTH 5 DAYS Performed at [facility], [place] [address], [place], [place] [address]    Report Status 08/31/2020 FINAL  Final  Culture, blood (routine x 2)     Status: None   Collection Time: 08/26/20  2:43 AM   Specimen: BLOOD  Result Value Ref Range Status   Specimen Description BLOOD SITE NOT SPECIFIED  Final   Special Requests   Final    BOTTLES DRAWN AEROBIC AND ANAEROBIC Blood Culture results may not be optimal due to an excessive volume of blood received in culture bottles   Culture   Final    NO GROWTH 5 DAYS Performed at [facility], [place] [address] [place], [place] [address]    Report Status 08/31/2020 FINAL  Final  MRSA PCR Screening     Status: None   Collection Time: 08/26/20  4:39 AM   Specimen: Nasopharyngeal  Result Value Ref Range Status   MRSA by PCR NEGATIVE NEGATIVE Final    Comment:        The GeneXpert MRSA Assay (FDA approved for NASAL specimens only), is one component of a comprehensive MRSA colonization surveillance program. It is not intended to diagnose MRSA infection nor to guide or monitor treatment for MRSA infections. Performed at [facility], [place] [address] [place], [place] [address]   Culture, blood (routine x 2)     Status: None   Collection Time: 08/26/20 11:46 AM   Specimen: BLOOD LEFT HAND  Result Value Ref Range Status   Specimen Description BLOOD LEFT HAND  Final   Special Requests   Final    BOTTLES DRAWN AEROBIC ONLY Blood Culture adequate volume   Culture   Final    NO GROWTH 5 DAYS Performed at [facility], [place] [address] [place], [place] [address]    Report Status 08/31/2020 FINAL  Final  Culture, blood (routine x 2)     Status: None   Collection Time: 08/26/20 11:47 AM   Specimen: BLOOD RIGHT ARM  Result Value Ref Range Status   Specimen Description BLOOD  RIGHT ARM  Final   Special Requests   Final    BOTTLES DRAWN AEROBIC ONLY Blood Culture adequate volume   Culture   Final    NO GROWTH 5 DAYS Performed at [facility], [place] [address], [place], [place] [address]    Report Status 08/31/2020 FINAL  Final     Labs: Basic Metabolic Panel: Recent Labs  Lab 08/27/20 0848 08/27/20 1812 08/28/20 0407 08/28/20 1040 08/28/20 1806 08/29/20 0444 08/30/20 0446 08/31/20 0106  NA  --   --   --  142  --  144 146* 144  K  --   --   --  3.5  --  3.5 3.1* 4.2  CL  --   --   --  104  --  106 106 109  CO2  --   --   --  27  --  29 27 26   GLUCOSE  --   --   --  114*  --  98 125* 111*  BUN  --   --   --  55*  --  42* 33* 30*  CREATININE  --   --   --  2.65*  --  2.08* 1.84* 1.56*  CALCIUM  --   --   --  8.8*  --  9.6 9.8  9.8  MG 1.9 2.0 2.2  --  2.1  --   --   --   PHOS 4.1 2.9 2.7  --  1.6*  --   --   --    Liver Function Tests: No results for input(s): AST, ALT, ALKPHOS, BILITOT, PROT, ALBUMIN in the last 168 hours. No results for input(s): LIPASE, AMYLASE in the last 168 hours. No results for input(s): AMMONIA in the last 168 hours. CBC: Recent Labs  Lab 08/28/20 1040 08/29/20 0444 08/30/20 0446  WBC 16.3* 12.2* 7.8  HGB 10.2* 11.5* 12.3*  HCT 32.4* 37.5* 40.5  MCV 82.2 81.0 80.7  PLT 281 301 331    CBG: Recent Labs  Lab 09/01/20 1633 09/01/20 1934 09/01/20 2332 09/02/20 0347 09/02/20 0747  GLUCAP 103* 120* 109* 141* 103*       Signed:  Oswald Hillock MD.  [facility] 09/02/2020, 11:13 AM

## 2020-09-02 NOTE — TOC Initial Note (Signed)
Transition of Care [facility]) - Initial/Assessment Note    Patient Details  Name: Joshua [name] MRN: [id number] Date of Birth: March 23, 1981  [facility]) CM/SW Contact:    Joanne Chars, LCSW Phone Number: 09/02/2020, 9:46 AM  Clinical Narrative:      Pt non verbal, CSW spoke with pt sister, Wynelle Link, informed her of pt DC and transfer back to [facility] today.  Pt has been at [facility] since May 2021, refuses a lot of services there.  Family still hoping to bring him home once he improves.  Pt is not vaccinated.               Expected Discharge Plan: Skilled Nursing Facility Barriers to Discharge: No Barriers Identified   Patient Goals and CMS Choice Patient states their goals for this hospitalization and ongoing recovery are:: go back home eventually      Expected Discharge Plan and Services Expected Discharge Plan: [place] Choice: [place] Living arrangements for the past 2 months: [place]                                      Prior Living Arrangements/Services Living arrangements for the past 2 months: [place] Lives with:: Facility Resident Patient language and need for interpreter reviewed:: No        Need for Family Participation in Patient Care: Yes (Comment) Care giver support system in place?: Yes (comment)   Criminal Activity/Legal Involvement Pertinent to Current Situation/Hospitalization: No - Comment as needed  Activities of Daily Living Home Assistive Devices/Equipment: Wheelchair ADL Screening (condition at time of admission) Patient's cognitive ability adequate to safely complete daily activities?: No Is the patient deaf or have difficulty hearing?: No Does the patient have difficulty seeing, even when wearing glasses/contacts?: No Does the patient have difficulty concentrating, remembering, or making decisions?: Yes Patient able to express need for  assistance with ADLs?: No Does the patient have difficulty dressing or bathing?: Yes Independently performs ADLs?: No Communication: Dependent Is this a change from baseline?: Pre-admission baseline Dressing (OT): Dependent Is this a change from baseline?: Pre-admission baseline Grooming: Dependent Is this a change from baseline?: Pre-admission baseline Feeding: Dependent Is this a change from baseline?: Pre-admission baseline Bathing: Dependent Is this a change from baseline?: Pre-admission baseline Toileting: Dependent Is this a change from baseline?: Pre-admission baseline In/Out Bed: Dependent Is this a change from baseline?: Pre-admission baseline Walks in Home: Dependent Is this a change from baseline?: Pre-admission baseline Does the patient have difficulty walking or climbing stairs?: Yes Weakness of Legs: Both Weakness of Arms/Hands: Both  Permission Sought/Granted Permission sought to share information with :  (pt non verbal, spoke with sister)                Emotional Assessment Appearance::  (UTA. Assessment completed remotely)   Affect (typically observed): Unable to Assess Orientation: : Oriented to Self Alcohol / Substance Use: Not Applicable Psych Involvement: No (comment)  Admission diagnosis:  Status epilepticus ([place]) [G40.901] Seizure (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[place])    Palliative care by specialist    Goals of care, counseling/discussion    Acute kidney injury  ([place])    Malignant hypertension 10/07/2019   Severe protein-calorie malnutrition ([place]) 10/07/2019   CKD (chronic kidney disease), stage IV ([place]) 10/07/2019   Seizure ([place]) 10/07/2019   Acute respiratory failure with hypoxia and hypercapnia ([place])    Hemorrhagic stroke ([place])    Cytotoxic brain edema ([place]) 09/10/2019   ICH (intracerebral hemorrhage) ([place]) 09/09/2019   Subdural hematoma ([place]) 09/09/2019   Intracranial hemorrhage ([place]) 09/09/2019   PCP:  Patient, [name] Pharmacy:   [id number] - [place], [place] [address] Phone: [phone] Fax: [phone]     Social Determinants of Health ([place]) Interventions    Readmission Risk Interventions Readmission Risk Prevention Plan 07/04/2020  Transportation Screening Complete  PCP or Specialist Appt within 5-7 Days Complete  Home Care Screening Complete  Medication Review (RN CM) Complete  Some recent data might be hidden

## 2020-09-02 NOTE — NC FL2 (Signed)
[place] LEVEL OF CARE SCREENING TOOL     IDENTIFICATION  [name] Birthdate: Feb 19, 1981 Sex: male Admission Date (Current Location): 08/25/2020  [place] and [place] Number:  Kathleen Argue [phone] [place] and Address:  [facility] [place], [place] [address], [place], [place] [address]      Provider Number: [id number]  Attending Physician Name and Address:  Oswald Hillock, MD  Relative Name and Phone Number:  Hailey [name], JGOTLX,[phone]    Current Level of Care: Hospital Recommended Level of Care: [facility] Prior Approval Number:    Date Approved/Denied:   PASRR Number: [id number]  Discharge Plan: SNF    Current Diagnoses: Patient Active Problem List   Diagnosis Date Noted  . Status epilepticus ([place])   . PRES (posterior reversible encephalopathy syndrome)   . Hypertensive urgency 07/05/2020  . Noncompliance with medication treatment due to intermittent use of medication 07/05/2020  . 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 PEG (percutaneous endoscopic gastrostomy) status ([place])   . Palliative care by specialist   . Goals of care, counseling/discussion   . Acute kidney injury ([place])   . Malignant hypertension 10/07/2019  . Severe protein-calorie malnutrition ([place]) 10/07/2019  . CKD (chronic kidney disease), stage IV ([place]) 10/07/2019  . Seizure ([place]) 10/07/2019  . Acute respiratory failure with hypoxia and hypercapnia (HCC)   . Hemorrhagic stroke ([place])   . Cytotoxic brain edema ([place]) 09/10/2019  . ICH (intracerebral hemorrhage) ([place]) 09/09/2019  . Subdural hematoma ([place]) 09/09/2019  . Intracranial hemorrhage ([place]) 09/09/2019    Orientation RESPIRATION BLADDER Height & Weight     Self  Normal Incontinent Weight: 134 lb 7.7 oz (61 kg) Height:  6' (182.9 cm)  BEHAVIORAL SYMPTOMS/MOOD NEUROLOGICAL BOWEL NUTRITION STATUS    Convulsions/Seizures, Grand mal Incontinent Diet (regular.  See discharge  summary)  AMBULATORY STATUS COMMUNICATION OF NEEDS Skin   Total Care Non-Verbally Normal                       Personal Care Assistance Level of Assistance  Total care       Total Care Assistance: Maximum assistance   Functional Limitations Info  Sight, Hearing, Speech Sight Info: Adequate Hearing Info: Adequate Speech Info: Impaired    SPECIAL CARE FACTORS FREQUENCY                       Contractures Contractures Info: Not present    Additional Factors Info  Code Status, Allergies Code Status Info: full Allergies Info: NKA           Current Medications (09/02/2020):  This is the current hospital active medication list Current Facility-Administered Medications  Medication Dose Route Frequency Provider Last Rate Last Admin  . acetaminophen (TYLENOL) tablet 650 mg  650 mg Per Tube Q4H PRN Kipp Brood, MD   650 mg at 08/29/20 1820  . amLODipine (NORVASC) tablet 10 mg  10 mg Per Tube q AM Kipp Brood, MD   10 mg at 09/02/20 0602  . baclofen (LIORESAL) tablet 10 mg  10 mg Per Tube QHS Kipp Brood, MD   10 mg at 09/01/20 2212  . cefTRIAXone (ROCEPHIN) 2 g in sodium chloride 0.9 % 100 mL IVPB  2 g Intravenous Q24H Corey Harold, NP 200 mL/hr at 09/01/20 1306 2 g at 09/01/20 1306  . chlorhexidine (PERIDEX) 0.12 % solution 15 mL  15 mL Mouth Rinse BID Corey Harold, NP  15 mL at 09/02/20 0852  . Chlorhexidine Gluconate Cloth 2 % PADS 6 each  6 each Topical Daily Collier Bullock, MD   6 each at 09/02/20 [phone]  . cloNIDine (CATAPRES - Dosed in mg/24 hr) patch 0.3 mg  0.3 mg Transdermal Weekly Corey Harold, NP   0.3 mg at 08/29/20 1323  . docusate sodium (COLACE) capsule 100 mg  100 mg Oral BID PRN Gleason, Otilio Carpen, PA-C      . feeding supplement (OSMOLITE 1.5 CAL) liquid 1,000 mL  1,000 mL Per Tube Continuous Oswald Hillock, MD 55 mL/hr at 08/31/20 1103 1,000 mL at 08/31/20 1103  . feeding supplement (PROSource TF) liquid 45 mL  45 mL Per Tube BID Oswald Hillock, MD   45 mL at 09/02/20 0851  . fentaNYL (SUBLIMAZE) injection 50 mcg  50 mcg Intravenous Q1H PRN Kipp Brood, MD   50 mcg at 08/27/20 1705  . free water 200 mL  200 mL Per Tube Q6H Corey Harold, NP   200 mL at 09/02/20 0601  . insulin aspart (novoLOG) injection 0-15 Units  0-15 Units Subcutaneous Q4H Gleason, Otilio Carpen, PA-C   2 Units at 09/02/20 0400  . labetalol (NORMODYNE) injection 10 mg  10 mg Intravenous Q10 min PRN Kipp Brood, MD   10 mg at 08/29/20 1820  . levETIRAcetam (KEPPRA) 100 MG/ML solution 750 mg  750 mg Per Tube BID Kipp Brood, MD   750 mg at 09/02/20 0852  . MEDLINE mouth rinse  15 mL Mouth Rinse q12n4p Corey Harold, NP   15 mL at 09/01/20 1724  . metoprolol tartrate (LOPRESSOR) tablet 50 mg  50 mg Per Tube BID Kipp Brood, MD   50 mg at 09/02/20 0852  . polyethylene glycol (MIRALAX / GLYCOLAX) packet 17 g  17 g Oral Daily PRN Gleason, Otilio Carpen, PA-C         Discharge Medications: Please see discharge summary for a list of discharge medications.  Relevant Imaging Results:  Relevant Lab Results:   Additional Information SS#: [id number]  Joanne Chars, LCSW

## 2020-09-02 NOTE — TOC Transition Note (Addendum)
Transition of Care [facility]) - CM/SW Discharge Note   Patient Details  Name: Joshua [name] MRN: [id number] Date of Birth: Dec 10, 1981  [facility]) CM/SW Contact:  Joanne Chars, LCSW Phone Number: 09/02/2020, 1:48 PM   Clinical Narrative:   Pt returning to [place], room 201. .  RN call report to [phone].  PTAR called [phone].    Final next level of care: Skilled Nursing Facility Barriers to Discharge: No Barriers Identified   Patient Goals and CMS Choice Patient states their goals for this hospitalization and ongoing recovery are:: go back home eventually      Discharge Placement              Patient [facility] bed at: [facility] Patient to be transferred to facility by: [facility] Name of family member notified: Hailey, sister Patient and family notified of of transfer: 09/02/20  Discharge Plan and Services     Post Acute Care Choice: [place]                               Social Determinants of Health (SDOH) Interventions     Readmission Risk Interventions Readmission Risk Prevention Plan 07/04/2020  Transportation Screening Complete  PCP or Specialist Appt within 5-7 Days Complete  Home Care Screening Complete  Medication Review (RN CM) Complete  Some recent data might be hidden

## 2020-09-20 ENCOUNTER — Telehealth: Payer: Self-pay | Admitting: General Practice

## 2020-09-20 NOTE — Telephone Encounter (Signed)
Joshua [name] with [facility] calling to let the dr know the pt's start of care will be delayed.  It will be 09/21/20.  cb [phone]

## 2020-09-22 ENCOUNTER — Emergency Department (HOSPITAL_COMMUNITY)
Admission: EM | Admit: 2020-09-22 | Discharge: 2020-09-22 | Disposition: A | Discharge status: Home / Self Care | Payer: Medicaid Other | Attending: Emergency Medicine | Admitting: Emergency Medicine

## 2020-09-22 ENCOUNTER — Other Ambulatory Visit: Payer: Self-pay

## 2020-09-22 ENCOUNTER — Emergency Department (HOSPITAL_COMMUNITY): Payer: Medicaid Other

## 2020-09-22 DIAGNOSIS — I129 Hypertensive chronic kidney disease with stage 1 through stage 4 chronic kidney disease, or unspecified chronic kidney disease: Secondary | ICD-10-CM | POA: Insufficient documentation

## 2020-09-22 DIAGNOSIS — N184 Chronic kidney disease, stage 4 (severe): Secondary | ICD-10-CM | POA: Insufficient documentation

## 2020-09-22 DIAGNOSIS — R569 Unspecified convulsions: Secondary | ICD-10-CM | POA: Diagnosis present

## 2020-09-22 DIAGNOSIS — G40909 Epilepsy, unspecified, not intractable, without status epilepticus: Secondary | ICD-10-CM | POA: Diagnosis not present

## 2020-09-22 DIAGNOSIS — Z79899 Other long term (current) drug therapy: Secondary | ICD-10-CM | POA: Insufficient documentation

## 2020-09-22 LAB — CBC WITH DIFFERENTIAL/PLATELET
Abs Immature Granulocytes: 0.02 10*3/uL (ref 0.00–0.07)
Basophils Absolute: 0 10*3/uL (ref 0.0–0.1)
Basophils Relative: 1 %
Eosinophils Absolute: 0.3 10*3/uL (ref 0.0–0.5)
Eosinophils Relative: 5 %
HCT: 39.2 % (ref 39.0–52.0)
Hemoglobin: 12.2 g/dL — ABNORMAL LOW (ref 13.0–17.0)
Immature Granulocytes: 0 %
Lymphocytes Relative: 34 %
Lymphs Abs: 2.1 10*3/uL (ref 0.7–4.0)
MCH: 25.7 pg — ABNORMAL LOW (ref 26.0–34.0)
MCHC: 31.1 g/dL (ref 30.0–36.0)
MCV: 82.5 fL (ref 80.0–100.0)
Monocytes Absolute: 0.5 10*3/uL (ref 0.1–1.0)
Monocytes Relative: 8 %
Neutro Abs: 3.2 10*3/uL (ref 1.7–7.7)
Neutrophils Relative %: 52 %
Platelets: 252 10*3/uL (ref 150–400)
RBC: 4.75 MIL/uL (ref 4.22–5.81)
RDW: 15.8 % — ABNORMAL HIGH (ref 11.5–15.5)
WBC: 6.2 10*3/uL (ref 4.0–10.5)
nRBC: 0 % (ref 0.0–0.2)

## 2020-09-22 LAB — COMPREHENSIVE METABOLIC PANEL
ALT: 11 U/L (ref 0–44)
AST: 21 U/L (ref 15–41)
Albumin: 3.2 g/dL — ABNORMAL LOW (ref 3.5–5.0)
Alkaline Phosphatase: 86 U/L (ref 38–126)
Anion gap: 10 (ref 5–15)
BUN: 13 mg/dL (ref 6–20)
CO2: 29 mmol/L (ref 22–32)
Calcium: 9.7 mg/dL (ref 8.9–10.3)
Chloride: 101 mmol/L (ref 98–111)
Creatinine, Ser: 1.5 mg/dL — ABNORMAL HIGH (ref 0.61–1.24)
GFR calc Af Amer: >60 mL/min (ref >60)
GFR calc non Af Amer: 57 mL/min — ABNORMAL LOW (ref >60)
Glucose, Bld: 102 mg/dL — ABNORMAL HIGH (ref 70–99)
Potassium: 4.4 mmol/L (ref 3.5–5.1)
Sodium: 140 mmol/L (ref 135–145)
Total Bilirubin: 0.2 mg/dL — ABNORMAL LOW (ref 0.3–1.2)
Total Protein: 7.1 g/dL (ref 6.5–8.1)

## 2020-09-22 LAB — CBG MONITORING, ED: Glucose-Capillary: 98 mg/dL (ref 70–99)

## 2020-09-22 MED ORDER — LEVETIRACETAM IN NACL 1500 MG/100ML IV SOLN
1500.0000 mg | Freq: Once | INTRAVENOUS | Status: AC
  Administered 2020-09-22: 1500 mg via INTRAVENOUS
  Filled 2020-09-22 (×2): qty 100

## 2020-09-22 NOTE — Discharge Instructions (Signed)
Your testing today looked good, there is no signs of any abnormal findings on the CT scan, no new strokes, no new bleeding, the blood work was normal. Please make sure that you are giving the liquid Keppra through the feeding tube as prescribed by the doctor and follow-up with the neurologist. You may bring Japhet back to the emergency department if you feel like there is any severe or worsening condition or for any other concerns otherwise follow-up with your family doctor.  Please be seen by the neurologist within the next 2 weeks, see the phone number above

## 2020-09-22 NOTE — ED Triage Notes (Addendum)
Pt BIB [facility] from home, pt left [facility] 3 days ago. Family witnessed him having a seizure, they report he is noncompliant with his meds. Pt has hx of stroke and seizures. Pt postictal, loss of bladder   22g RH   received 2.5mg  Versed

## 2020-09-22 NOTE — ED Notes (Signed)
Family verbalizes understanding of discharge instructions. Opportunity for questioning and answers were provided. Armband removed by staff, pt discharged from ED in wheelchair to home.

## 2020-09-22 NOTE — ED Notes (Signed)
Family en route to provide transportation for patient.

## 2020-09-22 NOTE — ED Provider Notes (Signed)
[facility] EMERGENCY DEPARTMENT Provider Note   CSN: [id number] Arrival date & time: 09/22/20  1146     History Chief Complaint  Patient presents with  . Seizures    Joshua [name] is a 39 y.o. male.  HPI   This patient is a 39 year old male, he has a history of recent diagnosis of intracranial hemorrhage for which she was admitted to the hospital.  He had subsequent craniotomy as well as seizures for which she has been taking Keppra.  He was discharged initially to a rehab facility and 3 days ago was transferred to his home, he has been living with family since that time and the report per the family to the paramedics this morning at least as far as the paramedics tell me is that he has not been taking his [place].  This is supposed to be dosed through his PEG tube however it is not clear that he is actually getting it.  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  He does not talk at baseline, the paramedics were told that he had some mild shaking activity but mostly staring off.  In route to the hospital there was some shaking of his arm followed by some confusion and altered mental status prompting the paramedics to give him 2.5 mg of Versed.  There was no further seizure activity since that time and he is now responsive to painful stimuli, following simple commands but still nonverbal which seems to be baseline according to the paramedic report.  I reviewed the patient's medical history at length including his most recent hospitalization.  The patient was admitted to the hospital August 29 of 2021 and discharged on 6 September.  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  He was discharged home in improved condition to the nursing facility.  No past medical history on file.  Patient Active Problem List    Diagnosis Date Noted  . Status epilepticus ([place])   . PRES (posterior reversible encephalopathy syndrome)   . Hypertensive urgency 07/05/2020  . Noncompliance with medication treatment due to intermittent use of medication 07/05/2020  . 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 PEG (percutaneous endoscopic gastrostomy) status ([place])   . Palliative care by specialist   . Goals of care, counseling/discussion   . Acute kidney injury ([place])   . Malignant hypertension 10/07/2019  . Severe protein-calorie malnutrition ([place]) 10/07/2019  . CKD (chronic kidney disease), stage IV ([place]) 10/07/2019  . Seizure ([place]) 10/07/2019  . Acute respiratory failure with hypoxia and hypercapnia ([facility])   . Hemorrhagic stroke ([place])   . Cytotoxic brain edema ([place]) 09/10/2019  . ICH (intracerebral hemorrhage) ([place]) 09/09/2019  . Subdural hematoma ([place]) 09/09/2019  . Intracranial hemorrhage ([place]) 09/09/2019    Past Surgical History:  Procedure Laterality Date  . CRANIOTOMY Left 09/09/2019   Procedure: DECOMPRESSIVE CRANIECTOMY AND  HEMATOMA EVACUATION, IMPLANTATION OF SKULL FLAP IN RIGHT ABDOMEN;  Surgeon: Erline Levine, MD;  Location: [facility];  Service: Neurosurgery;  Laterality: Left;  . ESOPHAGOGASTRODUODENOSCOPY (EGD) WITH PROPOFOL N/A 09/28/2019   Procedure: ESOPHAGOGASTRODUODENOSCOPY (EGD) WITH PROPOFOL;  Surgeon: Georganna Skeans, MD;  Location: [facility] ENDOSCOPY;  Service: General;  Laterality: N/A;  . PEG PLACEMENT N/A 09/28/2019   Procedure: PERCUTANEOUS ENDOSCOPIC GASTROSTOMY (PEG) PLACEMENT;  Surgeon: Georganna Skeans, MD;  Location: [facility];  Service: General;  Laterality: N/A;  No family history on file.  Social History   Tobacco Use  . Smoking status: Former Research scientist (life sciences)  . Smokeless tobacco: Former Network engineer  . Vaping Use: Unknown  Substance Use Topics  . Alcohol use: Yes  . Drug use: Not Currently    Home Medications Prior to Admission medications   Medication Sig Start Date End Date  Taking? Authorizing Provider  acetaminophen (TYLENOL) 325 MG tablet Place 2 tablets (650 mg total) into feeding tube every 4 (four) hours as needed for mild pain (temp > 100.5). Patient taking differently: Place 650 mg into feeding tube See admin instructions. 650 mg per tube two times a day and an additional 650 mg every four hours as needed for fever or pain- NOT TO EXCEED 4,000 MG/DAY FROM ALL COMBINED SOURCES 05/01/20   Barb Merino, MD  Amino Acids-Protein Hydrolys (FEEDING SUPPLEMENT, PRO-STAT SUGAR FREE 64,) LIQD Place 30 mLs into feeding tube 4 (four) times daily. Patient not taking: Reported on 06/23/2020 05/01/20   Barb Merino, MD  amLODipine (NORVASC) 10 MG tablet Place 10 mg into feeding tube in the morning.    [provider]  baclofen (LIORESAL) 10 MG tablet Place 10 mg into feeding tube at bedtime.    [provider]  cloNIDine (CATAPRES - DOSED IN MG/24 HR) 0.3 mg/24hr patch Place 1 patch (0.3 mg total) onto the skin once a week. 07/10/20   Samella Parr, NP  levETIRAcetam (KEPPRA) 100 MG/ML solution Place 7.5 mLs (750 mg total) into feeding tube 2 (two) times daily. 07/04/20   Samella Parr, NP  LORazepam (ATIVAN) 2 MG/ML concentrated solution Place 0.5 mLs (1 mg total) into feeding tube daily as needed for seizure. 09/02/20   Oswald Hillock, MD  magnesium hydroxide (MILK OF MAGNESIA) 400 MG/5ML suspension Place 30 mLs into feeding tube every 12 (twelve) hours as needed for mild constipation ("for 48 hours").    [provider]  metoprolol tartrate (LOPRESSOR) 50 MG tablet Place 1 tablet (50 mg total) into feeding tube 2 (two) times daily. 09/02/20   Oswald Hillock, MD  Multiple Vitamin (MULTIVITAMIN WITH MINERALS) TABS tablet Take 1 tablet by mouth daily. 07/05/20   Samella Parr, NP  NON FORMULARY Take by mouth See admin instructions. Magic Cup Frozen Desserts- One cup by mouth two times a day- with lunch and supper    [provider]  Nutritional  Supplements (FEEDING SUPPLEMENT, JEVITY 1.2 CAL,) LIQD Place 474 mLs into feeding tube in the morning, at noon, in the evening, and at bedtime. Patient not taking: Reported on 06/23/2020 05/01/20   Barb Merino, MD  polyethylene glycol (MIRALAX / GLYCOLAX) 17 g packet Place 17 g into feeding tube 2 (two) times daily. Patient taking differently: Place 17 g into feeding tube See admin instructions. Mix 17 grams into 4-8 ounces of water and administer, per tube, two times a day 05/01/20   Barb Merino, MD  Water For Irrigation, Sterile (FREE WATER) SOLN Place 200 mLs into feeding tube every 4 (four) hours. Patient taking differently: Place 50-250 mLs into feeding tube See admin instructions. 50 ml's before and after each feeding and an additional 250 ml's every 6 hours for hydration 05/01/20   Barb Merino, MD    Allergies    Patient has no known allergies.  Review of Systems   Review of Systems  Unable to perform ROS: Patient nonverbal    Physical Exam Updated Vital Signs BP 138/90 (BP Location: Right  Arm)   Pulse 66   Temp 97.6 F (36.4 C) (Axillary)   Resp 16   Ht 1.829 m (6')   Wt 61 kg   SpO2 100%   BMI 18.24 kg/m   Physical Exam Vitals and nursing note reviewed.  Constitutional:      General: He is not in acute distress.    Appearance: He is well-developed.  HENT:     Head: Normocephalic and atraumatic.     Mouth/Throat:     Pharynx: No oropharyngeal exudate.  Eyes:     General: No scleral icterus.       Right eye: No discharge.        Left eye: No discharge.     Conjunctiva/sclera: Conjunctivae normal.     Pupils: Pupils are equal, round, and reactive to light.  Neck:     Thyroid: No thyromegaly.     Vascular: No JVD.  Cardiovascular:     Rate and Rhythm: Normal rate and regular rhythm.     Heart sounds: Normal heart sounds. No murmur heard.  No friction rub. No gallop.   Pulmonary:     Effort: Pulmonary effort is normal. No respiratory distress.     Breath  sounds: Normal breath sounds. No wheezing or rales.  Abdominal:     General: Bowel sounds are normal. There is no distension.     Palpations: Abdomen is soft. There is no mass.     Tenderness: There is no abdominal tenderness.  Musculoskeletal:        General: No tenderness. Normal range of motion.     Cervical back: Normal range of motion and neck supple.     Comments: The patient is very thin, there is no deformity the extremities, joints are supple diffusely, compartments are soft diffusely  Lymphadenopathy:     Cervical: No cervical adenopathy.  Skin:    General: Skin is warm and dry.     Findings: No erythema or rash.     Comments: There is no skin breakdown  Neurological:     Mental Status: He is alert.     Coordination: Coordination normal.     Comments: The patient is able to follow only simple commands, he does appear to be at his baseline with his level of alertness, there is no active or ongoing seizure activity.  He is nonverbal.  He has some right-sided weakness compared to left side  Psychiatric:        Behavior: Behavior normal.     ED Results / Procedures / Treatments   Labs (all labs ordered are listed, but only abnormal results are displayed) Labs Reviewed  CBC WITH DIFFERENTIAL/PLATELET - Abnormal; Notable for the following components:      Result Value   Hemoglobin 12.2 (*)    MCH 25.7 (*)    RDW 15.8 (*)    All other components within normal limits  COMPREHENSIVE METABOLIC PANEL - Abnormal; Notable for the following components:   Glucose, Bld 102 (*)    Creatinine, Ser 1.50 (*)    Albumin 3.2 (*)    Total Bilirubin 0.2 (*)    GFR calc non Af Amer 57 (*)    All other components within normal limits  CBG MONITORING, ED    EKG EKG Interpretation  Date/Time:  Sunday September 22 2020 13:30:36 EDT Ventricular Rate:  60 PR Interval:    QRS Duration: 95 QT Interval:  400 QTC Calculation: 400 R Axis:   50 Text Interpretation: Sinus rhythm  Probable  anteroseptal infarct, old Nonspecific T abnormalities, lateral leads consistent with prior EKG - tachycardia has resolved Confirmed by Noemi Chapel [phone]) on 09/22/2020 1:45:02 PM   Radiology CT Head Wo Contrast  Result Date: 09/22/2020 CLINICAL DATA:  Witness seizure.  History of craniotomy EXAM: CT HEAD WITHOUT CONTRAST TECHNIQUE: Contiguous axial images were obtained from the base of the skull through the vertex without intravenous contrast. COMPARISON:  Brain MRI 08/26/2020 FINDINGS: Brain: Left MCA distribution encephalomalacia with bulging of the gliotic brain through the calvarial defect. Generalized brain atrophy for age with patchy white matter low-density likely from premature chronic small vessel ischemia. No visible acute infarct, acute hemorrhage, hydrocephalus, or collection. Vascular: No hyperdense vessel.  Intracranial vascular tortuosity Skull: Left craniectomy defect. Sinuses/Orbits: Negative IMPRESSION: 1. No acute finding. 2. Remote left MCA territory infarct with bulging through the craniectomy defect. 3. Premature atrophy and chronic white matter disease. Electronically Signed   By: Monte Fantasia M.D.   On: 09/22/2020 13:36    Procedures Procedures (including critical care time)  Medications Ordered in ED Medications  levETIRAcetam (KEPPRA) IVPB 1500 mg/ 100 mL premix (0 mg Intravenous Stopped 09/22/20 1503)    ED Course  I have reviewed the triage vital signs and the nursing notes.  Pertinent labs & imaging results that were available during my care of the patient were reviewed by me and considered in my medical decision making (see chart for details).    MDM Rules/Calculators/A&P                           The patient has had a seizure, I would suspect this given his lack of Keppra usage, his noncompliance is concerning given the presence of family at home to help him with his tube medication administration.  Will reach out to family if possible.  Additionally the  patient has had evidence of hemorrhage both on his initial CT scan as well as the most recent CT scan.  We will repeat the CT to make sure there is not worsening hemorrhage, labs pending, Keppra dosed.  He will be placed on a cardiac monitor and an EKG will be obtained  No further seizures after multiple hours CT without any further bleeding or abnormalities Pt has been given Keppra - CBC without any findings of concern CMP shows Cr of 1.5, better than previous measurements during prior admission.  I called the patients mother and step father - they are not his caregivers and referenced his brother who lives with him,  I discussed the care with the patient's brother who is his primary caregiver, phone number [phone]. He states that he has been giving him his medication as was given to him from the nursing facility when he picked him up 2 days ago. He states that he does not like to take pills so sometimes he has to put it in his food. He does not have any liquid medicine for him, the patient did come with a prescription for liquid medications for the Keppra and other medicines, the brother has been made aware of this, the patient is stable for discharge home    Final Clinical Impression(s) / ED Diagnoses Final diagnoses:  Seizure [facility])      Noemi Chapel, MD 09/22/20 1504

## 2020-09-24 ENCOUNTER — Ambulatory Visit: Payer: Medicaid Other | Admitting: Emergency Medicine

## 2020-10-04 ENCOUNTER — Emergency Department (HOSPITAL_COMMUNITY): Payer: Medicaid Other

## 2020-10-04 ENCOUNTER — Other Ambulatory Visit: Payer: Self-pay

## 2020-10-04 ENCOUNTER — Emergency Department (HOSPITAL_COMMUNITY)
Admission: EM | Admit: 2020-10-04 | Discharge: 2020-10-04 | Disposition: A | Discharge status: Home / Self Care | Payer: Medicaid Other | Attending: Emergency Medicine | Admitting: Emergency Medicine

## 2020-10-04 DIAGNOSIS — N184 Chronic kidney disease, stage 4 (severe): Secondary | ICD-10-CM | POA: Diagnosis not present

## 2020-10-04 DIAGNOSIS — Z87891 Personal history of nicotine dependence: Secondary | ICD-10-CM | POA: Diagnosis not present

## 2020-10-04 DIAGNOSIS — K5900 Constipation, unspecified: Secondary | ICD-10-CM | POA: Diagnosis present

## 2020-10-04 LAB — URINALYSIS, ROUTINE W REFLEX MICROSCOPIC
Bacteria, UA: NONE SEEN
Bilirubin Urine: NEGATIVE
Glucose, UA: NEGATIVE mg/dL
Hgb urine dipstick: NEGATIVE
Ketones, ur: NEGATIVE mg/dL
Nitrite: NEGATIVE
Protein, ur: NEGATIVE mg/dL
Specific Gravity, Urine: 1.012 (ref 1.005–1.030)
pH: 8 (ref 5.0–8.0)

## 2020-10-04 LAB — COMPREHENSIVE METABOLIC PANEL
ALT: 12 U/L (ref 0–44)
AST: 18 U/L (ref 15–41)
Albumin: 3.6 g/dL (ref 3.5–5.0)
Alkaline Phosphatase: 77 U/L (ref 38–126)
Anion gap: 13 (ref 5–15)
BUN: 15 mg/dL (ref 6–20)
CO2: 23 mmol/L (ref 22–32)
Calcium: 9.8 mg/dL (ref 8.9–10.3)
Chloride: 105 mmol/L (ref 98–111)
Creatinine, Ser: 1.47 mg/dL — ABNORMAL HIGH (ref 0.61–1.24)
GFR, Estimated: 59 mL/min — ABNORMAL LOW (ref >60)
Glucose, Bld: 91 mg/dL (ref 70–99)
Potassium: 4.3 mmol/L (ref 3.5–5.1)
Sodium: 141 mmol/L (ref 135–145)
Total Bilirubin: 0.4 mg/dL (ref 0.3–1.2)
Total Protein: 7.9 g/dL (ref 6.5–8.1)

## 2020-10-04 LAB — CBC WITH DIFFERENTIAL/PLATELET
Abs Immature Granulocytes: 0.01 10*3/uL (ref 0.00–0.07)
Basophils Absolute: 0 10*3/uL (ref 0.0–0.1)
Basophils Relative: 1 %
Eosinophils Absolute: 0.3 10*3/uL (ref 0.0–0.5)
Eosinophils Relative: 5 %
HCT: 43.4 % (ref 39.0–52.0)
Hemoglobin: 13.5 g/dL (ref 13.0–17.0)
Immature Granulocytes: 0 %
Lymphocytes Relative: 36 %
Lymphs Abs: 2.2 10*3/uL (ref 0.7–4.0)
MCH: 25.2 pg — ABNORMAL LOW (ref 26.0–34.0)
MCHC: 31.1 g/dL (ref 30.0–36.0)
MCV: 81.1 fL (ref 80.0–100.0)
Monocytes Absolute: 0.5 10*3/uL (ref 0.1–1.0)
Monocytes Relative: 8 %
Neutro Abs: 3.1 10*3/uL (ref 1.7–7.7)
Neutrophils Relative %: 50 %
Platelets: 268 10*3/uL (ref 150–400)
RBC: 5.35 MIL/uL (ref 4.22–5.81)
RDW: 15.8 % — ABNORMAL HIGH (ref 11.5–15.5)
WBC: 6.1 10*3/uL (ref 4.0–10.5)
nRBC: 0 % (ref 0.0–0.2)

## 2020-10-04 LAB — LIPASE, BLOOD: Lipase: 38 U/L (ref 11–51)

## 2020-10-04 MED ORDER — POLYETHYLENE GLYCOL 3350 17 G PO PACK
17.0000 g | PACK | ORAL | Status: DC
Start: 2020-10-04 — End: 2020-10-08

## 2020-10-04 MED ORDER — LORAZEPAM 2 MG/ML IJ SOLN
1.0000 mg | Freq: Once | INTRAMUSCULAR | Status: AC
  Administered 2020-10-04: 1 mg via INTRAMUSCULAR
  Filled 2020-10-04: qty 1

## 2020-10-04 MED ORDER — IOHEXOL 300 MG/ML  SOLN
100.0000 mL | Freq: Once | INTRAMUSCULAR | Status: AC | PRN
  Administered 2020-10-04: 100 mL via INTRAVENOUS

## 2020-10-04 NOTE — Discharge Instructions (Signed)
Follow-up with your regular care provider as instructed.   Return for any problem.  Use miralax as previously prescribed for treatment of constipation.

## 2020-10-04 NOTE — ED Triage Notes (Signed)
PT BIBA from home with 5 days of constipation. Pt has a PEG tube in place as well. Resp even and unlabored. Skin color WNL.

## 2020-10-04 NOTE — ED Provider Notes (Signed)
[facility] EMERGENCY DEPARTMENT Provider Note   CSN: [id number] Arrival date & time: 10/04/20  1023     History Chief Complaint  Patient presents with  . Constipation    Joshua [name] is a 39 y.o. male.  39 year old male with prior medical history as detailed below presents for evaluation of reported constipation.  Patient is unable to provide history given his prior history of intracranial hemorrhage.  Patient's history is obtained from his brother Joshua [name] [phone] -who reports that the patient has had no bowel movement for the last 4 to 5 days.  Patient is otherwise without reported episodes of vomiting, fever, significant distress.  Patient lives at home with his brother Joshua [name].  The history is provided by the patient and medical records.  Constipation Severity:  Mild Time since last bowel movement:  4 days Timing:  Constant Progression:  Unchanged Chronicity:  New Stool description:  None produced Relieved by:  Nothing Worsened by:  Nothing Associated symptoms: no abdominal pain and no fever        No past medical history on file.  Patient Active Problem List   Diagnosis Date Noted  . Status epilepticus ([place])   . PRES (posterior reversible encephalopathy syndrome)   . Hypertensive urgency 07/05/2020  . Noncompliance with medication treatment due to intermittent use of medication 07/05/2020  . 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 PEG (percutaneous endoscopic gastrostomy) status ([place])   . Palliative care by specialist   . Goals of care, counseling/discussion   . Acute kidney injury ([place])   . Malignant hypertension 10/07/2019  . Severe protein-calorie malnutrition ([place]) 10/07/2019  . CKD (chronic kidney disease), stage IV ([place]) 10/07/2019  . Seizure ([place]) 10/07/2019  . Acute respiratory failure with hypoxia and hypercapnia (HCC)   . Hemorrhagic stroke ([place])   . Cytotoxic brain edema ([place]) 09/10/2019  . ICH (intracerebral  hemorrhage) ([place]) 09/09/2019  . Subdural hematoma ([place]) 09/09/2019  . Intracranial hemorrhage ([place]) 09/09/2019    Past Surgical History:  Procedure Laterality Date  . CRANIOTOMY Left 09/09/2019   Procedure: DECOMPRESSIVE CRANIECTOMY AND  HEMATOMA EVACUATION, IMPLANTATION OF SKULL FLAP IN RIGHT ABDOMEN;  Surgeon: Erline Levine, MD;  Location: [facility];  Service: Neurosurgery;  Laterality: Left;  . ESOPHAGOGASTRODUODENOSCOPY (EGD) WITH PROPOFOL N/A 09/28/2019   Procedure: ESOPHAGOGASTRODUODENOSCOPY (EGD) WITH PROPOFOL;  Surgeon: Georganna Skeans, MD;  Location: [facility] ENDOSCOPY;  Service: General;  Laterality: N/A;  . PEG PLACEMENT N/A 09/28/2019   Procedure: PERCUTANEOUS ENDOSCOPIC GASTROSTOMY (PEG) PLACEMENT;  Surgeon: Georganna Skeans, MD;  Location: [facility] ENDOSCOPY;  Service: General;  Laterality: N/A;       No family history on file.  Social History   Tobacco Use  . Smoking status: Former Research scientist (life sciences)  . Smokeless tobacco: Former Network engineer  . Vaping Use: Unknown  Substance Use Topics  . Alcohol use: Yes  . Drug use: Not Currently    Home Medications Prior to Admission medications   Medication Sig Start Date End Date Taking? Authorizing Provider  acetaminophen (TYLENOL) 325 MG tablet Place 2 tablets (650 mg total) into feeding tube every 4 (four) hours as needed for mild pain (temp > 100.5). Patient taking differently: Place 650 mg into feeding tube See admin instructions. 650 mg per tube two times a day and an additional 650 mg every four hours as needed for fever or pain- NOT TO EXCEED 4,000 MG/DAY FROM ALL COMBINED SOURCES 05/01/20   Barb Merino, MD  Amino Acids-Protein  Hydrolys (FEEDING SUPPLEMENT, PRO-STAT SUGAR FREE 64,) LIQD Place 30 mLs into feeding tube 4 (four) times daily. Patient not taking: Reported on 06/23/2020 05/01/20   Barb Merino, MD  amLODipine (NORVASC) 10 MG tablet Place 10 mg into feeding tube in the morning.    [provider]  baclofen (LIORESAL) 10 MG  tablet Place 10 mg into feeding tube at bedtime.    [provider]  cloNIDine (CATAPRES - DOSED IN MG/24 HR) 0.3 mg/24hr patch Place 1 patch (0.3 mg total) onto the skin once a week. 07/10/20   Samella Parr, NP  levETIRAcetam (KEPPRA) 100 MG/ML solution Place 7.5 mLs (750 mg total) into feeding tube 2 (two) times daily. 07/04/20   Samella Parr, NP  LORazepam (ATIVAN) 2 MG/ML concentrated solution Place 0.5 mLs (1 mg total) into feeding tube daily as needed for seizure. 09/02/20   Oswald Hillock, MD  magnesium hydroxide (MILK OF MAGNESIA) 400 MG/5ML suspension Place 30 mLs into feeding tube every 12 (twelve) hours as needed for mild constipation ("for 48 hours").    [provider]  metoprolol tartrate (LOPRESSOR) 50 MG tablet Place 1 tablet (50 mg total) into feeding tube 2 (two) times daily. 09/02/20   Oswald Hillock, MD  Multiple Vitamin (MULTIVITAMIN WITH MINERALS) TABS tablet Take 1 tablet by mouth daily. 07/05/20   Samella Parr, NP  NON FORMULARY Take by mouth See admin instructions. Magic Cup Frozen Desserts- One cup by mouth two times a day- with lunch and supper    [provider]  Nutritional Supplements (FEEDING SUPPLEMENT, JEVITY 1.2 CAL,) LIQD Place 474 mLs into feeding tube in the morning, at noon, in the evening, and at bedtime. Patient not taking: Reported on 06/23/2020 05/01/20   Barb Merino, MD  polyethylene glycol (MIRALAX / GLYCOLAX) 17 g packet Place 17 g into feeding tube 2 (two) times daily. Patient taking differently: Place 17 g into feeding tube See admin instructions. Mix 17 grams into 4-8 ounces of water and administer, per tube, two times a day 05/01/20   Barb Merino, MD  Water For Irrigation, Sterile (FREE WATER) SOLN Place 200 mLs into feeding tube every 4 (four) hours. Patient taking differently: Place 50-250 mLs into feeding tube See admin instructions. 50 ml's before and after each feeding and an additional 250 ml's every 6 hours for  hydration 05/01/20   Barb Merino, MD    Allergies    Patient has no known allergies.  Review of Systems   Review of Systems  Constitutional: Negative for fever.  Gastrointestinal: Positive for constipation. Negative for abdominal pain.  All other systems reviewed and are negative.   Physical Exam Updated Vital Signs BP (!) 141/104 (BP Location: Left Arm)   Pulse 69   Resp 14   SpO2 100%   Physical Exam Vitals and nursing note reviewed.  Constitutional:      General: He is not in acute distress.    Appearance: Normal appearance. He is well-developed.     Comments: Uncooperative with exam   HENT:     Head: Normocephalic and atraumatic.  Eyes:     Conjunctiva/sclera: Conjunctivae normal.     Pupils: Pupils are equal, round, and reactive to light.  Cardiovascular:     Rate and Rhythm: Normal rate and regular rhythm.     Heart sounds: Normal heart sounds.  Pulmonary:     Effort: Pulmonary effort is normal. No respiratory distress.     Breath sounds: Normal breath  sounds.  Abdominal:     General: There is no distension.     Palpations: Abdomen is soft.     Tenderness: There is no abdominal tenderness.     Comments: PEG tube in place   Musculoskeletal:        General: No deformity. Normal range of motion.     Cervical back: Normal range of motion and neck supple.  Skin:    General: Skin is warm and dry.  Neurological:     Mental Status: He is alert. Mental status is at baseline.     ED Results / Procedures / Treatments   Labs (all labs ordered are listed, but only abnormal results are displayed) Labs Reviewed  URINALYSIS, ROUTINE W REFLEX MICROSCOPIC - Abnormal; Notable for the following components:      Result Value   APPearance HAZY (*)    Leukocytes,Ua TRACE (*)    All other components within normal limits  COMPREHENSIVE METABOLIC PANEL - Abnormal; Notable for the following components:   Creatinine, Ser 1.47 (*)    GFR, Estimated 59 (*)    All other  components within normal limits  CBC WITH DIFFERENTIAL/PLATELET - Abnormal; Notable for the following components:   MCH 25.2 (*)    RDW 15.8 (*)    All other components within normal limits  LIPASE, BLOOD    EKG None  Radiology CT ABDOMEN PELVIS W CONTRAST  Result Date: 10/04/2020 CLINICAL DATA:  Acute abdominal pain.  Constipation. EXAM: CT ABDOMEN AND PELVIS WITH CONTRAST TECHNIQUE: Multidetector CT imaging of the abdomen and pelvis was performed using the standard protocol following bolus administration of intravenous contrast. CONTRAST:  166mL OMNIPAQUE IOHEXOL 300 MG/ML  SOLN COMPARISON:  None. FINDINGS: Lower chest: Lung bases are clear. Hepatobiliary: No focal hepatic lesion. No biliary duct dilatation. Common bile duct is normal. Pancreas: Pancreas is normal. No ductal dilatation. No pancreatic inflammation. Spleen: Normal spleen Adrenals/urinary tract: Adrenal glands and kidneys are normal. The ureters and bladder normal. Stomach/Bowel: Gastrostomy tube with retention bulb in the gastric antrum. Stomach is normal. Duodenum and small-bowel normal. Terminal ileum normal. Appendix not identified but no secondary signs appendicitis. Ascending and transverse colon are normal. Normal volume stool. Descending and sigmoid colon normal. Normal volume stool. Vascular/Lymphatic: Abdominal aorta is normal caliber. No periportal or retroperitoneal adenopathy. No pelvic adenopathy. Reproductive: Prostate unremarkable Other: No free fluid. Musculoskeletal: No aggressive osseous lesion. Two craniectomy flaps are present in the anterior wall of the RIGHT abdomen. IMPRESSION: 1. No acute abdominopelvic findings. 2. Percutaneous gastrostomy tube position in the stomach without complication. 3. Normal volume stool. Electronically Signed   By: Suzy Bouchard M.D.   On: 10/04/2020 14:27    Procedures Procedures (including critical care time)  Medications Ordered in ED Medications  LORazepam (ATIVAN)  injection 1 mg (1 mg Intramuscular Given 10/04/20 1109)    ED Course  I have reviewed the triage vital signs and the nursing notes.  Pertinent labs & imaging results that were available during my care of the patient were reviewed by me and considered in my medical decision making (see chart for details).    MDM Rules/Calculators/A&P                          MDM  Screen complete  Montrail Mehrer was evaluated in Emergency Department on 10/04/2020 for the symptoms described in the history of present illness. He was evaluated in the context of the global COVID-19 pandemic, which  necessitated consideration that the patient might be at risk for infection with the SARS-CoV-2 virus that causes COVID-19. Institutional protocols and algorithms that pertain to the evaluation of patients at risk for COVID-19 are in a state of rapid change based on information released by regulatory bodies including the CDC and federal and state organizations. These policies and algorithms were followed during the patient's care in the ED.  Patient is presenting for evaluation of constipation.  Workup in the ED does not demonstrate evidence of acute pathology.  Patient appropriate for outpatient care.      Final Clinical Impression(s) / ED Diagnoses Final diagnoses:  Constipation, unspecified constipation type    Rx / DC Orders ED Discharge Orders    None       Valarie Merino, MD 10/04/20 1601

## 2020-10-04 NOTE — ED Notes (Addendum)
Called [facility] for transport to [address], [place].

## 2020-10-04 NOTE — ED Notes (Signed)
Girl Friend at bedside is going home now since pt is going home via [place]. She requests Brother [name] can be called at [phone].

## 2020-10-08 ENCOUNTER — Other Ambulatory Visit: Payer: Self-pay

## 2020-10-08 ENCOUNTER — Ambulatory Visit (INDEPENDENT_AMBULATORY_CARE_PROVIDER_SITE_OTHER): Payer: Medicaid Other | Admitting: Emergency Medicine

## 2020-10-08 ENCOUNTER — Encounter: Payer: Self-pay | Admitting: Emergency Medicine

## 2020-10-08 VITALS — BP 119/88 | HR 114 | Temp 99.2°F

## 2020-10-08 DIAGNOSIS — K5900 Constipation, unspecified: Secondary | ICD-10-CM | POA: Diagnosis not present

## 2020-10-08 MED ORDER — POLYETHYLENE GLYCOL 3350 17 G PO PACK: 17.0000 g | PACK | ORAL | 3 refills | Status: AC

## 2020-10-08 NOTE — Progress Notes (Signed)
Joshua [name] 39 y.o.   Chief Complaint  Patient presents with  . Constipation    per family member (brother) has not used bathroom in 10 days    HISTORY OF PRESENT ILLNESS: This is a 39 y.o. male first visit to this office. Patient has a long list of multiple complex medical problems including noncompliance with treatments. One of the problems is aphasia with brain damage.  Wheelchair-bound.  Here with his brother who is providing some of the history however he is not very familiar with all the patient's medical problems. Here today complaining of constipation which is not a new problem.  Patient has a feeding gastric tube.  He does not swallow medications or food. Extensive medical history.  Medical records reviewed with patient's brother. No other complaints or medical concerns today.  HPI   Prior to Admission medications   Medication Sig Start Date End Date Taking? Authorizing Provider  Amino Acids-Protein Hydrolys (FEEDING SUPPLEMENT, PRO-STAT SUGAR FREE 64,) LIQD Place 30 mLs into feeding tube 4 (four) times daily. 05/01/20  Yes Barb Merino, MD  amLODipine (NORVASC) 10 MG tablet Place 10 mg into feeding tube in the morning.   Yes [provider]  baclofen (LIORESAL) 10 MG tablet Place 10 mg into feeding tube at bedtime.   Yes [provider]  levETIRAcetam (KEPPRA) 100 MG/ML solution Place 7.5 mLs (750 mg total) into feeding tube 2 (two) times daily. 07/04/20  Yes Samella Parr, NP  LORazepam (ATIVAN) 2 MG/ML concentrated solution Place 0.5 mLs (1 mg total) into feeding tube daily as needed for seizure. 09/02/20  Yes Oswald Hillock, MD  metoprolol tartrate (LOPRESSOR) 50 MG tablet Place 1 tablet (50 mg total) into feeding tube 2 (two) times daily. 09/02/20  Yes Oswald Hillock, MD  Nutritional Supplements (FEEDING SUPPLEMENT, JEVITY 1.2 CAL,) LIQD Place 474 mLs into feeding tube in the morning, at noon, in the evening, and at bedtime. 05/01/20  Yes Barb Merino, MD   Water For Irrigation, Sterile (FREE WATER) SOLN Place 200 mLs into feeding tube every 4 (four) hours. Patient taking differently: Place 50-250 mLs into feeding tube See admin instructions. 50 ml's before and after each feeding and an additional 250 ml's every 6 hours for hydration 05/01/20  Yes Barb Merino, MD  acetaminophen (TYLENOL) 325 MG tablet Place 2 tablets (650 mg total) into feeding tube every 4 (four) hours as needed for mild pain (temp > 100.5). Patient not taking: Reported on 10/08/2020 05/01/20   Barb Merino, MD  cloNIDine (CATAPRES - DOSED IN MG/24 HR) 0.3 mg/24hr patch Place 1 patch (0.3 mg total) onto the skin once a week. Patient not taking: Reported on 10/08/2020 07/10/20   Samella Parr, NP  magnesium hydroxide (MILK OF MAGNESIA) 400 MG/5ML suspension Place 30 mLs into feeding tube every 12 (twelve) hours as needed for mild constipation ("for 48 hours"). Patient not taking: Reported on 10/08/2020    [provider]  Multiple Vitamin (MULTIVITAMIN WITH MINERALS) TABS tablet Take 1 tablet by mouth daily. Patient not taking: Reported on 10/08/2020 07/05/20   Samella Parr, NP  NON FORMULARY Take by mouth See admin instructions. Magic Cup Frozen Desserts- One cup by mouth two times a day- with lunch and supper Patient not taking: Reported on 10/08/2020    [provider]  polyethylene glycol (MIRALAX / GLYCOLAX) 17 g packet Place 17 g into feeding tube See admin instructions. Mix 17 grams into 4-8 ounces of water and administer, per  tube, two times a day Patient not taking: Reported on 10/08/2020 10/04/20   Valarie Merino, MD    No Known Allergies  Patient Active Problem List   Diagnosis Date Noted  . Status epilepticus ([place])   . PRES (posterior reversible encephalopathy syndrome)   . Hypertensive urgency 07/05/2020  . Noncompliance with medication treatment due to intermittent use of medication 07/05/2020  . 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  PEG (percutaneous endoscopic gastrostomy) status ([place])   . Palliative care by specialist   . Goals of care, counseling/discussion   . Acute kidney injury ([place])   . Malignant hypertension 10/07/2019  . Severe protein-calorie malnutrition ([place]) 10/07/2019  . CKD (chronic kidney disease), stage IV ([place]) 10/07/2019  . Seizure ([place]) 10/07/2019  . Acute respiratory failure with hypoxia and hypercapnia (HCC)   . Hemorrhagic stroke ([place])   . Cytotoxic brain edema ([place]) 09/10/2019  . ICH (intracerebral hemorrhage) ([place]) 09/09/2019  . Subdural hematoma ([place]) 09/09/2019  . Intracranial hemorrhage ([place]) 09/09/2019    History reviewed. No pertinent past medical history.  Past Surgical History:  Procedure Laterality Date  . CRANIOTOMY Left 09/09/2019   Procedure: DECOMPRESSIVE CRANIECTOMY AND  HEMATOMA EVACUATION, IMPLANTATION OF SKULL FLAP IN RIGHT ABDOMEN;  Surgeon: Erline Levine, MD;  Location: [facility];  Service: Neurosurgery;  Laterality: Left;  . ESOPHAGOGASTRODUODENOSCOPY (EGD) WITH PROPOFOL N/A 09/28/2019   Procedure: ESOPHAGOGASTRODUODENOSCOPY (EGD) WITH PROPOFOL;  Surgeon: Georganna Skeans, MD;  Location: [facility] ENDOSCOPY;  Service: General;  Laterality: N/A;  . PEG PLACEMENT N/A 09/28/2019   Procedure: PERCUTANEOUS ENDOSCOPIC GASTROSTOMY (PEG) PLACEMENT;  Surgeon: Georganna Skeans, MD;  Location[facility] ENDOSCOPY;  Service: General;  Laterality: N/A;    Social History   Socioeconomic History  . Marital status: Single    Spouse name: Not on file  . Number of children: Not on file  . Years of education: Not on file  . Highest education level: Not on file  Occupational History  . Not on file  Tobacco Use  . Smoking status: Former Research scientist (life sciences)  . Smokeless tobacco: Former Network engineer  . Vaping Use: Unknown  Substance and Sexual Activity  . Alcohol use: Yes  . Drug use: Not Currently  . Sexual activity: Not Currently  Other Topics Concern  . Not on file  Social History Narrative  . Not on  file   Social Determinants of Health   Financial Resource Strain: Unknown  . Difficulty of Paying Living Expenses: Patient refused  Food Insecurity: Unknown  . Worried About Charity fundraiser in the Last Year: Patient refused  . Ran Out of Food in the Last Year: Patient refused  Transportation Needs: Unknown  . Lack of Transportation (Medical): Patient refused  . Lack of Transportation (Non-Medical): Patient refused  Physical Activity: Unknown  . Days of Exercise per Week: Patient refused  . Minutes of Exercise per Session: Patient refused  Stress: Unknown  . Feeling of Stress : Patient refused  Social Connections: Unknown  . Frequency of Communication with Friends and Family: Patient refused  . Frequency of Social Gatherings with Friends and Family: Patient refused  . Attends Religious Services: Patient refused  . Active Member of Clubs or Organizations: Patient refused  . Attends Archivist Meetings: Patient refused  . Marital Status: Patient refused  Intimate Partner Violence: Unknown  . Fear of Current or Ex-Partner: Patient refused  . Emotionally Abused: Patient refused  . Physically Abused: Patient refused  . Sexually Abused: Patient  refused    History reviewed. No pertinent family history.   Review of Systems  Constitutional: Negative for fever.  Respiratory: Negative for cough and shortness of breath.   Gastrointestinal: Positive for constipation. Negative for abdominal pain, nausea and vomiting.  Skin: Negative for rash.   Today's Vitals   10/08/20 0909  BP: 119/88  Pulse: (!) 114  Temp: 99.2 F (37.3 C)  TempSrc: Temporal   There is no height or weight on file to calculate BMI.   Physical Exam Vitals reviewed.  HENT:     Head: Normocephalic.  Cardiovascular:     Rate and Rhythm: Normal rate.  Pulmonary:     Effort: Pulmonary effort is normal.  Abdominal:     General: There is no distension.     Tenderness: There is no abdominal  tenderness. There is no guarding.     Comments: Feeding tube in place  Skin:    General: Skin is warm and dry.      ASSESSMENT & PLAN: After thorough and careful review of patient's medical records I believe patient's chronic medical care will be better served at a different more specialized office. It is in this patient's best interest to pursue long-term medical care with a different primary care provider. This was clearly communicated to patient's brother. [place] was seen today for constipation.  Diagnoses and all orders for this visit:  Constipation, unspecified constipation type -     polyethylene glycol (MIRALAX / GLYCOLAX) 17 g packet; Place 17 g into feeding tube See admin instructions. Mix 17 grams into 4-8 ounces of water and administer, per tube, two times a day     Agustina Caroli, MD [facility] [place]

## 2020-10-08 NOTE — Patient Instructions (Addendum)
     If you have lab work done today you will be contacted with your lab results within the next 2 weeks.  If you have not heard from [place] then please contact us. The fastest way to get your results is to register for My Chart.   IF you received an x-ray today, you will receive an invoice from [facility]. Please contact [facility] at [phone] with questions or concerns regarding your invoice.   IF you received labwork today, you will receive an invoice from [facility]. Please contact [facility] at [phone] with questions or concerns regarding your invoice.   Our billing staff will not be able to assist you with questions regarding bills from these companies.  You will be contacted with the lab results as soon as they are available. The fastest way to get your results is to activate your My Chart account. Instructions are located on the last page of this paperwork. If you have not heard from [place] regarding the results in 2 weeks, please contact this office.     Constipation, Adult Constipation is when a person:  Poops (has a bowel movement) fewer times in a week than normal.  Has a hard time pooping.  Has poop that is dry, hard, or bigger than normal. Follow these instructions at home: Eating and drinking   Eat foods that have a lot of fiber, such as: ? Fresh fruits and vegetables. ? Whole grains. ? Beans.  Eat less of foods that are high in fat, low in fiber, or overly processed, such as: ? [place] fries. ? Hamburgers. ? Cookies. ? Candy. ? Soda.  Drink enough fluid to keep your pee (urine) clear or pale yellow. General instructions  Exercise regularly or as told by your doctor.  Go to the restroom when you feel like you need to poop. Do not hold it in.  Take over-the-counter and prescription medicines only as told by your doctor. These include any fiber supplements.  Do pelvic floor retraining exercises, such as: ? Doing deep breathing while  relaxing your lower belly (abdomen). ? Relaxing your pelvic floor while pooping.  Watch your condition for any changes.  Keep all follow-up visits as told by your doctor. This is important. Contact a doctor if:  You have pain that gets worse.  You have a fever.  You have not pooped for 4 days.  You throw up (vomit).  You are not hungry.  You lose weight.  You are bleeding from the anus.  You have thin, pencil-like poop (stool). Get help right away if:  You have a fever, and your symptoms suddenly get worse.  You leak poop or have blood in your poop.  Your belly feels hard or bigger than normal (is bloated).  You have very bad belly pain.  You feel dizzy or you faint. This information is not intended to replace advice given to you by your health care provider. Make sure you discuss any questions you have with your health care provider. Document Revised: 11/26/2017 Document Reviewed: 06/03/2016 Elsevier Patient Education  2020 [employer].

## 2020-12-23 ENCOUNTER — Ambulatory Visit: Payer: Medicaid Other | Admitting: Emergency Medicine

## 2021-02-04 ENCOUNTER — Inpatient Hospital Stay (HOSPITAL_COMMUNITY)
Admission: EM | Admit: 2021-02-04 | Discharge: 2021-03-07 | DRG: 101 | Disposition: A | Discharge status: Home Health | Payer: Medicaid - Out of State | Attending: Family Medicine | Admitting: Family Medicine

## 2021-02-04 ENCOUNTER — Encounter (HOSPITAL_COMMUNITY): Payer: Self-pay | Admitting: Student

## 2021-02-04 ENCOUNTER — Emergency Department (HOSPITAL_COMMUNITY): Payer: Medicaid - Out of State

## 2021-02-04 ENCOUNTER — Other Ambulatory Visit: Payer: Self-pay

## 2021-02-04 DIAGNOSIS — R1310 Dysphagia, unspecified: Secondary | ICD-10-CM | POA: Diagnosis present

## 2021-02-04 DIAGNOSIS — R451 Restlessness and agitation: Secondary | ICD-10-CM | POA: Diagnosis not present

## 2021-02-04 DIAGNOSIS — R509 Fever, unspecified: Secondary | ICD-10-CM

## 2021-02-04 DIAGNOSIS — I358 Other nonrheumatic aortic valve disorders: Secondary | ICD-10-CM | POA: Diagnosis not present

## 2021-02-04 DIAGNOSIS — I330 Acute and subacute infective endocarditis: Secondary | ICD-10-CM | POA: Diagnosis not present

## 2021-02-04 DIAGNOSIS — B957 Other staphylococcus as the cause of diseases classified elsewhere: Secondary | ICD-10-CM | POA: Diagnosis not present

## 2021-02-04 DIAGNOSIS — Z1629 Resistance to other single specified antibiotic: Secondary | ICD-10-CM | POA: Diagnosis not present

## 2021-02-04 DIAGNOSIS — G40919 Epilepsy, unspecified, intractable, without status epilepticus: Secondary | ICD-10-CM

## 2021-02-04 DIAGNOSIS — G9389 Other specified disorders of brain: Secondary | ICD-10-CM | POA: Diagnosis present

## 2021-02-04 DIAGNOSIS — I69351 Hemiplegia and hemiparesis following cerebral infarction affecting right dominant side: Secondary | ICD-10-CM

## 2021-02-04 DIAGNOSIS — E440 Moderate protein-calorie malnutrition: Secondary | ICD-10-CM | POA: Insufficient documentation

## 2021-02-04 DIAGNOSIS — G939 Disorder of brain, unspecified: Secondary | ICD-10-CM | POA: Diagnosis present

## 2021-02-04 DIAGNOSIS — G931 Anoxic brain damage, not elsewhere classified: Secondary | ICD-10-CM | POA: Diagnosis present

## 2021-02-04 DIAGNOSIS — G9349 Other encephalopathy: Secondary | ICD-10-CM | POA: Diagnosis present

## 2021-02-04 DIAGNOSIS — Z431 Encounter for attention to gastrostomy: Secondary | ICD-10-CM

## 2021-02-04 DIAGNOSIS — I10 Essential (primary) hypertension: Secondary | ICD-10-CM

## 2021-02-04 DIAGNOSIS — Z20822 Contact with and (suspected) exposure to covid-19: Secondary | ICD-10-CM | POA: Diagnosis present

## 2021-02-04 DIAGNOSIS — G40909 Epilepsy, unspecified, not intractable, without status epilepticus: Principal | ICD-10-CM | POA: Diagnosis present

## 2021-02-04 DIAGNOSIS — E43 Unspecified severe protein-calorie malnutrition: Secondary | ICD-10-CM | POA: Diagnosis present

## 2021-02-04 DIAGNOSIS — Z931 Gastrostomy status: Secondary | ICD-10-CM

## 2021-02-04 DIAGNOSIS — R4701 Aphasia: Secondary | ICD-10-CM | POA: Diagnosis present

## 2021-02-04 DIAGNOSIS — I809 Phlebitis and thrombophlebitis of unspecified site: Secondary | ICD-10-CM

## 2021-02-04 DIAGNOSIS — T801XXA Vascular complications following infusion, transfusion and therapeutic injection, initial encounter: Secondary | ICD-10-CM | POA: Diagnosis not present

## 2021-02-04 DIAGNOSIS — N1831 Chronic kidney disease, stage 3a: Secondary | ICD-10-CM | POA: Diagnosis present

## 2021-02-04 DIAGNOSIS — Z681 Body mass index (BMI) 19 or less, adult: Secondary | ICD-10-CM

## 2021-02-04 DIAGNOSIS — Z23 Encounter for immunization: Secondary | ICD-10-CM

## 2021-02-04 DIAGNOSIS — E876 Hypokalemia: Secondary | ICD-10-CM | POA: Diagnosis present

## 2021-02-04 DIAGNOSIS — I69220 Aphasia following other nontraumatic intracranial hemorrhage: Secondary | ICD-10-CM

## 2021-02-04 DIAGNOSIS — I129 Hypertensive chronic kidney disease with stage 1 through stage 4 chronic kidney disease, or unspecified chronic kidney disease: Secondary | ICD-10-CM | POA: Diagnosis present

## 2021-02-04 DIAGNOSIS — Z87891 Personal history of nicotine dependence: Secondary | ICD-10-CM

## 2021-02-04 DIAGNOSIS — I808 Phlebitis and thrombophlebitis of other sites: Secondary | ICD-10-CM | POA: Diagnosis not present

## 2021-02-04 DIAGNOSIS — R569 Unspecified convulsions: Secondary | ICD-10-CM

## 2021-02-04 DIAGNOSIS — Z79899 Other long term (current) drug therapy: Secondary | ICD-10-CM

## 2021-02-04 DIAGNOSIS — Z87820 Personal history of traumatic brain injury: Secondary | ICD-10-CM

## 2021-02-04 DIAGNOSIS — Y848 Other medical procedures as the cause of abnormal reaction of the patient, or of later complication, without mention of misadventure at the time of the procedure: Secondary | ICD-10-CM | POA: Diagnosis not present

## 2021-02-04 DIAGNOSIS — G319 Degenerative disease of nervous system, unspecified: Secondary | ICD-10-CM | POA: Diagnosis present

## 2021-02-04 DIAGNOSIS — R64 Cachexia: Secondary | ICD-10-CM | POA: Diagnosis present

## 2021-02-04 DIAGNOSIS — R7881 Bacteremia: Secondary | ICD-10-CM | POA: Diagnosis not present

## 2021-02-04 LAB — CBG MONITORING, ED: Glucose-Capillary: 112 mg/dL — ABNORMAL HIGH (ref 70–99)

## 2021-02-04 MED ORDER — LORAZEPAM 2 MG/ML IJ SOLN
1.0000 mg | Freq: Once | INTRAMUSCULAR | Status: DC
  Filled 2021-02-04: qty 1

## 2021-02-04 MED ORDER — LORAZEPAM 2 MG/ML IJ SOLN
1.0000 mg | Freq: Once | INTRAMUSCULAR | Status: AC
  Administered 2021-02-04: 1 mg via INTRAVENOUS

## 2021-02-04 MED ORDER — LEVETIRACETAM IN NACL 1000 MG/100ML IV SOLN
2000.0000 mg | Freq: Once | INTRAVENOUS | Status: AC
  Administered 2021-02-04: 2000 mg via INTRAVENOUS
  Filled 2021-02-04: qty 200

## 2021-02-04 NOTE — ED Provider Notes (Signed)
[facility] EMERGENCY DEPARTMENT Provider Note   CSN: [id number] Arrival date & time: 02/04/21  2214     History Chief Complaint  Patient presents with  . Possible seizure    Joshua [name] is a 40 y.o. male with a hx of ICH with subsequent craniotomy & seizures for which he has been taking keppra, subdural hematoma, status epilepticus, PRES, CKD, & prior status epilepticus who presents to the ED via EMS from home due to concern for possible seizure.   Level 5 caveat applies as patient is non-verbal.  I called & spoke with patient's mother [name] for additional history- she states @ baseline patient is nonverbal, non ambulatory. He has R sided weakness, typically can move his LUE/LLE, and move his eyes & track people. She states his eyes are not typically fixed to one side- when asked if they are fixed to the left she states that only happens when he is having a seizure.  She was not with the patient tonight therefore is unsure what happened that prompted ED visit.   HPI     History reviewed. No pertinent past medical history.  Patient Active Problem List   Diagnosis Date Noted  . Status epilepticus ([place])   . PRES (posterior reversible encephalopathy syndrome)   . Hypertensive urgency 07/05/2020  . Noncompliance with medication treatment due to intermittent use of medication 07/05/2020  . 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 PEG (percutaneous endoscopic gastrostomy) status ([place])   . Palliative care by specialist   . Goals of care, counseling/discussion   . Acute kidney injury ([place])   . Malignant hypertension 10/07/2019  . Severe protein-calorie malnutrition ([place]) 10/07/2019  . CKD (chronic kidney disease), stage IV ([place]) 10/07/2019  . Seizure ([place]) 10/07/2019  . Acute respiratory failure with hypoxia and hypercapnia (HCC)   . Hemorrhagic stroke ([place])   . Cytotoxic brain edema ([place]) 09/10/2019  . ICH (intracerebral hemorrhage) ([place]) 09/09/2019  .  Subdural hematoma ([place]) 09/09/2019  . Intracranial hemorrhage ([place]) 09/09/2019    Past Surgical History:  Procedure Laterality Date  . CRANIOTOMY Left 09/09/2019   Procedure: DECOMPRESSIVE CRANIECTOMY AND  HEMATOMA EVACUATION, IMPLANTATION OF SKULL FLAP IN RIGHT ABDOMEN;  Surgeon: Erline Levine, MD;  Location: [facility];  Service: Neurosurgery;  Laterality: Left;  . ESOPHAGOGASTRODUODENOSCOPY (EGD) WITH PROPOFOL N/A 09/28/2019   Procedure: ESOPHAGOGASTRODUODENOSCOPY (EGD) WITH PROPOFOL;  Surgeon: Georganna Skeans, MD;  Location: [facility] ENDOSCOPY;  Service: General;  Laterality: N/A;  . PEG PLACEMENT N/A 09/28/2019   Procedure: PERCUTANEOUS ENDOSCOPIC GASTROSTOMY (PEG) PLACEMENT;  Surgeon: Georganna Skeans, MD;  Location: [facility];  Service: General;  Laterality: N/A;       History reviewed. No pertinent family history.  Social History   Tobacco Use  . Smoking status: Former Research scientist (life sciences)  . Smokeless tobacco: Former Network engineer  . Vaping Use: Unknown  Substance Use Topics  . Alcohol use: Yes  . Drug use: Not Currently    Home Medications Prior to Admission medications   Medication Sig Start Date End Date Taking? Authorizing Provider  acetaminophen (TYLENOL) 325 MG tablet Place 2 tablets (650 mg total) into feeding tube every 4 (four) hours as needed for mild pain (temp > 100.5). Patient not taking: Reported on 10/08/2020 05/01/20   Barb Merino, MD  Amino Acids-Protein Hydrolys (FEEDING SUPPLEMENT, PRO-STAT SUGAR FREE 64,) LIQD Place 30 mLs into feeding tube 4 (four) times daily. 05/01/20   Barb Merino, MD  amLODipine (NORVASC) 10 MG tablet Place  10 mg into feeding tube in the morning.    [provider]  baclofen (LIORESAL) 10 MG tablet Place 10 mg into feeding tube at bedtime.    [provider]  cloNIDine (CATAPRES - DOSED IN MG/24 HR) 0.3 mg/24hr patch Place 1 patch (0.3 mg total) onto the skin once a week. Patient not taking: Reported on 10/08/2020 07/10/20   Samella Parr, NP  levETIRAcetam (KEPPRA) 100 MG/ML solution Place 7.5 mLs (750 mg total) into feeding tube 2 (two) times daily. 07/04/20   Samella Parr, NP  LORazepam (ATIVAN) 2 MG/ML concentrated solution Place 0.5 mLs (1 mg total) into feeding tube daily as needed for seizure. 09/02/20   Oswald Hillock, MD  magnesium hydroxide (MILK OF MAGNESIA) 400 MG/5ML suspension Place 30 mLs into feeding tube every 12 (twelve) hours as needed for mild constipation ("for 48 hours"). Patient not taking: Reported on 10/08/2020    [provider]  metoprolol tartrate (LOPRESSOR) 50 MG tablet Place 1 tablet (50 mg total) into feeding tube 2 (two) times daily. 09/02/20   Oswald Hillock, MD  Multiple Vitamin (MULTIVITAMIN WITH MINERALS) TABS tablet Take 1 tablet by mouth daily. Patient not taking: Reported on 10/08/2020 07/05/20   Samella Parr, NP  NON FORMULARY Take by mouth See admin instructions. Magic Cup Frozen Desserts- One cup by mouth two times a day- with lunch and supper Patient not taking: Reported on 10/08/2020    [provider]  Nutritional Supplements (FEEDING SUPPLEMENT, JEVITY 1.2 CAL,) LIQD Place 474 mLs into feeding tube in the morning, at noon, in the evening, and at bedtime. 05/01/20   Barb Merino, MD  polyethylene glycol (MIRALAX / GLYCOLAX) 17 g packet Place 17 g into feeding tube See admin instructions. Mix 17 grams into 4-8 ounces of water and administer, per tube, two times a day 10/08/20   Horald Pollen, MD  Water For Irrigation, Sterile (FREE WATER) SOLN Place 200 mLs into feeding tube every 4 (four) hours. Patient taking differently: Place 50-250 mLs into feeding tube See admin instructions. 50 ml's before and after each feeding and an additional 250 ml's every 6 hours for hydration 05/01/20   Barb Merino, MD    Allergies    Patient has no known allergies.  Review of Systems   Review of Systems  Unable to perform ROS: Patient nonverbal    Physical  Exam Updated Vital Signs BP (!) 117/94 (BP Location: Right Arm)   Pulse 89   Temp 98.2 F (36.8 C) (Oral)   Resp 16   SpO2 99%   Physical Exam Vitals and nursing note reviewed.  Constitutional:      Appearance: He is underweight.  HENT:     Head: Atraumatic.  Eyes:     Comments: Gaze deviated to the left.   Cardiovascular:     Rate and Rhythm: Normal rate and regular rhythm.  Pulmonary:     Effort: Pulmonary effort is normal. No respiratory distress.     Breath sounds: Normal breath sounds. No wheezing, rhonchi or rales.  Abdominal:     General: There is no distension.     Palpations: Abdomen is soft.     Tenderness: There is no abdominal tenderness.     Comments: PEG tube in place.   Musculoskeletal:     Right lower leg: No edema.     Left lower leg: No edema.  Skin:    General: Skin is warm and dry.  Neurological:  Mental Status: He is alert.     Comments: RUE contracted. Lower extremities with increased tone. LUE without active movement but does not appear contracted. Gaze deviated to the left. Non verbal, not following commands.      ED Results / Procedures / Treatments   Labs (all labs ordered are listed, but only abnormal results are displayed) Labs Reviewed  CBG MONITORING, ED - Abnormal; Notable for the following components:      Result Value   Glucose-Capillary 112 (*)    All other components within normal limits  RESP PANEL BY RT-PCR (FLU A&B, COVID) [id number]  BASIC METABOLIC PANEL  CBC WITH DIFFERENTIAL/PLATELET  URINALYSIS, ROUTINE W REFLEX MICROSCOPIC    EKG None  Radiology CT Head Wo Contrast  Result Date: 02/04/2021 CLINICAL DATA:  Seizure EXAM: CT HEAD WITHOUT CONTRAST TECHNIQUE: Contiguous axial images were obtained from the base of the skull through the vertex without intravenous contrast. COMPARISON:  09/22/2020 FINDINGS: Brain: Large area of encephalomalacia in the left MCA territory related to prior infarct. There is volume loss with ex  vacuo dilatation of the lateral ventricles. No acute hemorrhage or extra-axial collection. Vascular: No abnormal hyperdensity of the major intracranial arteries or dural venous sinuses. No intracranial atherosclerosis. Skull: Remote left pterional craniectomy. Sinuses/Orbits: No fluid levels or advanced mucosal thickening of the visualized paranasal sinuses. No mastoid or middle ear effusion. The orbits are normal. IMPRESSION: 1. No acute intracranial abnormality. 2. 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 Electronically Signed   By: Ulyses Jarred M.D.   On: 02/04/2021 23:56   DG Chest Portable 1 View  Result Date: 02/05/2021 CLINICAL DATA:  Seizure EXAM: PORTABLE CHEST 1 VIEW COMPARISON:  None. FINDINGS: The heart size and mediastinal contours are within normal limits. Mildly hazy airspace opacity seen within the right mid and lower lung. No large airspace consolidation or pleural effusion. The visualized skeletal structures are unremarkable. IMPRESSION: Mild hazy airspace opacity within the right mid and lower lung which could be due to atelectasis and/or infectious etiology. Electronically Signed   By: Prudencio Pair M.D.   On: 02/05/2021 00:00    Procedures Procedures   Medications Ordered in ED Medications  LORazepam (ATIVAN) injection 1 mg (has no administration in time range)  LORazepam (ATIVAN) injection 1 mg (has no administration in time range)    ED Course  I have reviewed the triage vital signs and the nursing notes.  Pertinent labs & imaging results that were available during my care of the patient were reviewed by me and considered in my medical decision making (see chart for details).    MDM Rules/Calculators/A&P                         Patient presents to the ED for possible seizure activity.  Upon my initial assessment of the patient he was placed in a hallway bed, his vitals were without significant abnormality, and he is noted to  have a fixed deviated gaze to the left. I called & spoke with his mother who relayed this is not his baseline and is typical of his seizure activity. 1 mg of ativan ordered. Alerted nursing staff of need for placement in a room on a monitor. Maintaining respirations at this time.   Additional history obtained:  Additional history obtained from chart review & nursing note review.  Admission for [place] requiring hemicraniectomy followed by development of seizures, had left forced deviated  gaze during hospitalization.   22:53: Ativan given.   23:00: No significant change. 2 g of keppra ordered. Plan to discuss w/ neurology. Significant other at bedside with patient's brother on speaker phone who relays patient has been in his normal state of health and receiving all medications as prescribed, started to look to the left which concerned them that he was going to have a seizure prompting EMS call.   23:14: CONSULT: Discussed with neurologist Dr. Rory Percy- in agreement with interventions thus far, needs head CT, will see patient in the ED.   Findings and plan of care discussed with supervising physician Dr. Francia Greaves who is in agreement.   Lab Tests:  I Ordered, reviewed, and interpreted labs, which included:  CBG: 112 CBC: Unremarkable.  BMP: Mild elevation in creatinine- similar to prior.   23:43: Re-discussed with Dr. Rory Percy who has evaluated patient- gaze returning to midline, tracking some. Recommends admission to medicine, will need to increase in his keppra dosing, routine EEG in the AM- see his notes for details. Recommends UA & CXR. Appreciate input.   Imaging Studies ordered:  I ordered imaging studies which included CT head & CXR, I independently reviewed, formal radiology impression shows:  CT head:  1. No acute intracranial abnormality. 2. 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  CXR: Mild hazy airspace opacity within the right mid and lower lung  which could be due to atelectasis and/or infectious etiology-- no leukocytosis, no reported cough from family, covid pending, abx deferred at this time.   00:41: CONSULT: Discussed with hospitalist Dr. Tonie Griffith- accepts admission.   Portions of this note were generated with [employer]obby[employer]. Dictation errors may occur despite best attempts at proofreading.  Final Clinical Impression(s) / ED Diagnoses Final diagnoses:  Seizure [facility])    Rx / DC Orders ED Discharge Orders    None       Amaryllis Dyke, PA-C 02/05/21 [id number]    Valarie Merino, MD 02/06/21 1042

## 2021-02-04 NOTE — Consult Note (Signed)
Neurology Consultation  Reason for Consult: Seizures Referring Physician: Dr Francia Greaves, EDP  CC: Seizures  History is obtained from: Chart review  HPI: Joshua [name] is a 40 y.o. male was a past medical history of a large [place] in September 2020 requiring hemicraniectomy and evacuation followed by an extended in hospital stay of 200 birthdays, eventually discharged home in May 2021, followed by development of seizures and multiple ER visits and hospital admissions for concern for seizures/seizure-like activity/concern for status, brought in again today for concern for seizures. Patient at baseline is nonverbal with right hemiplegia and requires full assistance with ADLs, and is being cared for at home by family.  There was a family member who had accompanied the patient but had left the hospital by the time I was there to see him.  The concern today was that he had a leftward gaze, which usually happens when he has a seizure and the gaze did not break concerning the family for a seizure. While initially evaluated in the ED by ED providers, he continued to have a leftward gaze.  He was given 1 dose of Ativan followed by a load of Keppra 2 g IV x1.  By the time of my evaluation, he had no gaze preference.  Detailed exam below. Patient unable to provide any history.  ROS:  Unable to obtain due to altered mental status.    Past medical history: ICH, prior left hemicraniectomy, right hemiparesis, seizures   History reviewed. No pertinent family history. No family history of stroke  Social History:   reports that he has quit smoking. He has quit using smokeless tobacco. He reports current alcohol use. He reports previous drug use.  Medications No current facility-administered medications for this encounter.  Current Outpatient Medications:  .  acetaminophen (TYLENOL) 325 MG tablet, Place 2 tablets (650 mg total) into feeding tube every 4 (four) hours as needed for mild pain (temp > 100.5).  (Patient not taking: Reported on 10/08/2020), Disp:  , Rfl:  .  Amino Acids-Protein Hydrolys (FEEDING SUPPLEMENT, PRO-STAT SUGAR FREE 64,) LIQD, Place 30 mLs into feeding tube 4 (four) times daily., Disp: 887 mL, Rfl: 0 .  amLODipine (NORVASC) 10 MG tablet, Place 10 mg into feeding tube in the morning., Disp: , Rfl:  .  baclofen (LIORESAL) 10 MG tablet, Place 10 mg into feeding tube at bedtime., Disp: , Rfl:  .  cloNIDine (CATAPRES - DOSED IN MG/24 HR) 0.3 mg/24hr patch, Place 1 patch (0.3 mg total) onto the skin once a week. (Patient not taking: Reported on 10/08/2020), Disp: 4 patch, Rfl: 12 .  levETIRAcetam (KEPPRA) 100 MG/ML solution, Place 7.5 mLs (750 mg total) into feeding tube 2 (two) times daily., Disp: 473 mL, Rfl: 12 .  LORazepam (ATIVAN) 2 MG/ML concentrated solution, Place 0.5 mLs (1 mg total) into feeding tube daily as needed for seizure., Disp: 10 mL, Rfl: 0 .  magnesium hydroxide (MILK OF MAGNESIA) 400 MG/5ML suspension, Place 30 mLs into feeding tube every 12 (twelve) hours as needed for mild constipation ("for 48 hours"). (Patient not taking: Reported on 10/08/2020), Disp: , Rfl:  .  metoprolol tartrate (LOPRESSOR) 50 MG tablet, Place 1 tablet (50 mg total) into feeding tube 2 (two) times daily., Disp: , Rfl:  .  Multiple Vitamin (MULTIVITAMIN WITH MINERALS) TABS tablet, Take 1 tablet by mouth daily. (Patient not taking: Reported on 10/08/2020), Disp: , Rfl:  .  NON FORMULARY, Take by mouth See admin instructions. Magic Cup Frozen Desserts- One cup  by mouth two times a day- with lunch and supper (Patient not taking: Reported on 10/08/2020), Disp: , Rfl:  .  Nutritional Supplements (FEEDING SUPPLEMENT, JEVITY 1.2 CAL,) LIQD, Place 474 mLs into feeding tube in the morning, at noon, in the evening, and at bedtime., Disp: , Rfl: 0 .  polyethylene glycol (MIRALAX / GLYCOLAX) 17 g packet, Place 17 g into feeding tube See admin instructions. Mix 17 grams into 4-8 ounces of water and  administer, per tube, two times a day, Disp: 14 each, Rfl: 3 .  Water For Irrigation, Sterile (FREE WATER) SOLN, Place 200 mLs into feeding tube every 4 (four) hours. (Patient taking differently: Place 50-250 mLs into feeding tube See admin instructions. 50 ml's before and after each feeding and an additional 250 ml's every 6 hours for hydration), Disp:  , Rfl:    Exam: Current vital signs: BP 130/86   Pulse 96   Temp 98.2 F (36.8 C) (Oral)   Resp (!) 29   SpO2 99%  Vital signs in last 24 hours: Temp:  [98.2 F (36.8 C)] 98.2 F (36.8 C) (02/08 2221) Pulse Rate:  [89-96] 96 (02/08 2315) Resp:  [16-29] 29 (02/08 2315) BP: (117-130)/(86-94) 130/86 (02/08 2315) SpO2:  [99 %] 99 % (02/08 2315)  General: Cachectic looking man, he is awake, eyes open, in no distress HEENT: No flap on the left hemicrania site-soft, dry oral mucous membranes CVS: Regular rate rhythm Respiratory: Breathing well and saturating normally on room air Extremities: Warm well perfused, cachectic. Neurological exam Eyes open, does not respond to voice, does not respond to noxious stimulation. Does not follow any commands but it does look like that he was able to track me on both sides. Nonverbal Cranial nerves: Pupils are equal round reactive to light, no forced gaze or gaze deviation, blinks to threat inconsistently from both sides. Motor examination: Increased tone and contractures in all fours, worse on right.    Does not spontaneously move any extremities.  To noxious stimulation, no significant movement or grimacing is appreciated. Sensory examination: As above  Labs I have reviewed labs in epic and the results pertinent to this consultation are: No new labs  CBC    Component Value Date/Time   WBC 6.1 10/04/2020 1144   RBC 5.35 10/04/2020 1144   HGB 13.5 10/04/2020 1144   HCT 43.4 10/04/2020 1144   PLT 268 10/04/2020 1144   MCV 81.1 10/04/2020 1144   MCH 25.2 (L) 10/04/2020 1144   MCHC 31.1  10/04/2020 1144   RDW 15.8 (H) 10/04/2020 1144   LYMPHSABS 2.2 10/04/2020 1144   MONOABS 0.5 10/04/2020 1144   EOSABS 0.3 10/04/2020 1144   BASOSABS 0.0 10/04/2020 1144    CMP     Component Value Date/Time   NA 141 10/04/2020 1144   K 4.3 10/04/2020 1144   CL 105 10/04/2020 1144   CO2 23 10/04/2020 1144   GLUCOSE 91 10/04/2020 1144   BUN 15 10/04/2020 1144   CREATININE 1.47 (H) 10/04/2020 1144   CALCIUM 9.8 10/04/2020 1144   PROT 7.9 10/04/2020 1144   ALBUMIN 3.6 10/04/2020 1144   AST 18 10/04/2020 1144   ALT 12 10/04/2020 1144   ALKPHOS 77 10/04/2020 1144   BILITOT 0.4 10/04/2020 1144   GFRNONAA 59 (L) 10/04/2020 1144   GFRAA >60 09/22/2020 1153    Lipid Panel     Component Value Date/Time   CHOL 134 09/16/2019 0613   TRIG 156 (H) 06/23/2020 2110  HDL 28 (L) 09/16/2019 0613   CHOLHDL 4.8 09/16/2019 0613   VLDL 20 09/16/2019 0613   LDLCALC 86 09/16/2019 0613     Imaging I have reviewed the images obtained:  CT-scan of the brain-radiologist read pending-on my review, unchanged from before encephalomalacia in the left MCA territory with craniotomy defect with no bone flap, with bulging of the gliotic brain through the calvarial defect.  Generalized brain atrophy, progressed from previous.  Premature chronic small vessel ischemia.   Assessment:  40 year old with a prior large left ICH status post craniotomy with no bone flap, seizures and possible status epilepticus in the past, presenting with concern for seizures. At baseline, very poor exam. Unclear how much of a change this is right now but he did present with left gaze deviation which has now resolved after administration of Ativan and Keppra. Breakthrough seizure is definitely a possibility given such a large area of encephalomalacia in the left hemisphere and a leftward gaze would likely point towards a postictal state rather than active seizures, which would, if emanating from the left hemisphere, deviated  gaze to the right. We should look for factors that can lower his seizure threshold such as systemic infection and toxic metabolic derangements.  Impression Breakthrough seizure Low suspicion for status epilepticus Prior ICH with progressive brain atrophy   Recommendations: Loaded with Keppra 2 g already-agree with the load. Increase Keppra from 750 twice daily to 1 g twice daily. Routine EEG in the morning Seizure precautions Ativan only if seizure lasting more than 5 minutes-also please notify neurology at that time. Check urinalysis, chest x-ray. Treat any toxic metabolic derangements if found.  Discussed my plan with Sammy Petrucelli, PA-C in the emergency room.  -- Amie Portland, MD Neurologist [facility] Pager: [phone]

## 2021-02-04 NOTE — ED Triage Notes (Signed)
BIB [facility] from home. Family told EMS that they think he is going to have a seizure. Pt has not had a seizure. Compliant with medication. Baseline is non verbal and total care.

## 2021-02-05 ENCOUNTER — Encounter (HOSPITAL_COMMUNITY): Payer: Self-pay | Admitting: Family Medicine

## 2021-02-05 DIAGNOSIS — R1310 Dysphagia, unspecified: Secondary | ICD-10-CM | POA: Diagnosis present

## 2021-02-05 DIAGNOSIS — R4701 Aphasia: Secondary | ICD-10-CM

## 2021-02-05 DIAGNOSIS — Z931 Gastrostomy status: Secondary | ICD-10-CM | POA: Diagnosis not present

## 2021-02-05 DIAGNOSIS — Z23 Encounter for immunization: Secondary | ICD-10-CM | POA: Diagnosis not present

## 2021-02-05 DIAGNOSIS — T801XXA Vascular complications following infusion, transfusion and therapeutic injection, initial encounter: Secondary | ICD-10-CM | POA: Diagnosis not present

## 2021-02-05 DIAGNOSIS — G931 Anoxic brain damage, not elsewhere classified: Secondary | ICD-10-CM | POA: Diagnosis not present

## 2021-02-05 DIAGNOSIS — R451 Restlessness and agitation: Secondary | ICD-10-CM | POA: Diagnosis not present

## 2021-02-05 DIAGNOSIS — Z681 Body mass index (BMI) 19 or less, adult: Secondary | ICD-10-CM | POA: Diagnosis not present

## 2021-02-05 DIAGNOSIS — Z87891 Personal history of nicotine dependence: Secondary | ICD-10-CM | POA: Diagnosis not present

## 2021-02-05 DIAGNOSIS — G939 Disorder of brain, unspecified: Secondary | ICD-10-CM

## 2021-02-05 DIAGNOSIS — I69220 Aphasia following other nontraumatic intracranial hemorrhage: Secondary | ICD-10-CM | POA: Diagnosis not present

## 2021-02-05 DIAGNOSIS — Z79899 Other long term (current) drug therapy: Secondary | ICD-10-CM | POA: Diagnosis not present

## 2021-02-05 DIAGNOSIS — R569 Unspecified convulsions: Secondary | ICD-10-CM | POA: Diagnosis present

## 2021-02-05 DIAGNOSIS — I69351 Hemiplegia and hemiparesis following cerebral infarction affecting right dominant side: Secondary | ICD-10-CM | POA: Diagnosis not present

## 2021-02-05 DIAGNOSIS — R7881 Bacteremia: Secondary | ICD-10-CM | POA: Diagnosis not present

## 2021-02-05 DIAGNOSIS — I129 Hypertensive chronic kidney disease with stage 1 through stage 4 chronic kidney disease, or unspecified chronic kidney disease: Secondary | ICD-10-CM | POA: Diagnosis present

## 2021-02-05 DIAGNOSIS — E43 Unspecified severe protein-calorie malnutrition: Secondary | ICD-10-CM | POA: Diagnosis not present

## 2021-02-05 DIAGNOSIS — G319 Degenerative disease of nervous system, unspecified: Secondary | ICD-10-CM | POA: Diagnosis present

## 2021-02-05 DIAGNOSIS — N1831 Chronic kidney disease, stage 3a: Secondary | ICD-10-CM | POA: Diagnosis present

## 2021-02-05 DIAGNOSIS — I808 Phlebitis and thrombophlebitis of other sites: Secondary | ICD-10-CM | POA: Diagnosis not present

## 2021-02-05 DIAGNOSIS — E440 Moderate protein-calorie malnutrition: Secondary | ICD-10-CM | POA: Diagnosis not present

## 2021-02-05 DIAGNOSIS — I10 Essential (primary) hypertension: Secondary | ICD-10-CM | POA: Diagnosis not present

## 2021-02-05 DIAGNOSIS — G40909 Epilepsy, unspecified, not intractable, without status epilepticus: Secondary | ICD-10-CM | POA: Diagnosis not present

## 2021-02-05 DIAGNOSIS — Z20822 Contact with and (suspected) exposure to covid-19: Secondary | ICD-10-CM | POA: Diagnosis not present

## 2021-02-05 DIAGNOSIS — B957 Other staphylococcus as the cause of diseases classified elsewhere: Secondary | ICD-10-CM | POA: Diagnosis not present

## 2021-02-05 DIAGNOSIS — Z87820 Personal history of traumatic brain injury: Secondary | ICD-10-CM | POA: Diagnosis not present

## 2021-02-05 DIAGNOSIS — E876 Hypokalemia: Secondary | ICD-10-CM | POA: Diagnosis present

## 2021-02-05 DIAGNOSIS — G9389 Other specified disorders of brain: Secondary | ICD-10-CM | POA: Diagnosis present

## 2021-02-05 DIAGNOSIS — Y848 Other medical procedures as the cause of abnormal reaction of the patient, or of later complication, without mention of misadventure at the time of the procedure: Secondary | ICD-10-CM | POA: Diagnosis not present

## 2021-02-05 DIAGNOSIS — R64 Cachexia: Secondary | ICD-10-CM | POA: Diagnosis not present

## 2021-02-05 LAB — CBC
HCT: 45.7 % (ref 39.0–52.0)
Hemoglobin: 14.1 g/dL (ref 13.0–17.0)
MCH: 25.9 pg — ABNORMAL LOW (ref 26.0–34.0)
MCHC: 30.9 g/dL (ref 30.0–36.0)
MCV: 83.9 fL (ref 80.0–100.0)
Platelets: 279 10*3/uL (ref 150–400)
RBC: 5.45 MIL/uL (ref 4.22–5.81)
RDW: 15.7 % — ABNORMAL HIGH (ref 11.5–15.5)
WBC: 5.7 10*3/uL (ref 4.0–10.5)
nRBC: 0 % (ref 0.0–0.2)

## 2021-02-05 LAB — BASIC METABOLIC PANEL
Anion gap: 11 (ref 5–15)
Anion gap: 9 (ref 5–15)
BUN: 17 mg/dL (ref 6–20)
BUN: 17 mg/dL (ref 6–20)
CO2: 25 mmol/L (ref 22–32)
CO2: 25 mmol/L (ref 22–32)
Calcium: 9.3 mg/dL (ref 8.9–10.3)
Calcium: 9.7 mg/dL (ref 8.9–10.3)
Chloride: 104 mmol/L (ref 98–111)
Chloride: 106 mmol/L (ref 98–111)
Creatinine, Ser: 1.36 mg/dL — ABNORMAL HIGH (ref 0.61–1.24)
Creatinine, Ser: 1.32 mg/dL — ABNORMAL HIGH (ref 0.61–1.24)
GFR, Estimated: >60 mL/min (ref >60)
GFR, Estimated: >60 mL/min (ref >60)
Glucose, Bld: 111 mg/dL — ABNORMAL HIGH (ref 70–99)
Glucose, Bld: 114 mg/dL — ABNORMAL HIGH (ref 70–99)
Potassium: 3.7 mmol/L (ref 3.5–5.1)
Potassium: 3.8 mmol/L (ref 3.5–5.1)
Sodium: 140 mmol/L (ref 135–145)
Sodium: 140 mmol/L (ref 135–145)

## 2021-02-05 LAB — RESP PANEL BY RT-PCR (FLU A&B, COVID) ARPGX2
Influenza A by PCR: NEGATIVE
Influenza B by PCR: NEGATIVE
SARS Coronavirus 2 by RT PCR: NEGATIVE

## 2021-02-05 LAB — CBC WITH DIFFERENTIAL/PLATELET
Abs Immature Granulocytes: 0.01 10*3/uL (ref 0.00–0.07)
Basophils Absolute: 0 10*3/uL (ref 0.0–0.1)
Basophils Relative: 0 %
Eosinophils Absolute: 0.4 10*3/uL (ref 0.0–0.5)
Eosinophils Relative: 6 %
HCT: 47.5 % (ref 39.0–52.0)
Hemoglobin: 14.6 g/dL (ref 13.0–17.0)
Immature Granulocytes: 0 %
Lymphocytes Relative: 33 %
Lymphs Abs: 2.3 10*3/uL (ref 0.7–4.0)
MCH: 25.4 pg — ABNORMAL LOW (ref 26.0–34.0)
MCHC: 30.7 g/dL (ref 30.0–36.0)
MCV: 82.8 fL (ref 80.0–100.0)
Monocytes Absolute: 0.5 10*3/uL (ref 0.1–1.0)
Monocytes Relative: 8 %
Neutro Abs: 3.7 10*3/uL (ref 1.7–7.7)
Neutrophils Relative %: 53 %
Platelets: 270 10*3/uL (ref 150–400)
RBC: 5.74 MIL/uL (ref 4.22–5.81)
RDW: 15.3 % (ref 11.5–15.5)
WBC: 7 10*3/uL (ref 4.0–10.5)
nRBC: 0 % (ref 0.0–0.2)

## 2021-02-05 MED ORDER — HYDRALAZINE HCL 20 MG/ML IJ SOLN: 10.0000 mg | Freq: Four times a day (QID) | INTRAMUSCULAR | Status: DC | PRN

## 2021-02-05 MED ORDER — METOPROLOL TARTRATE 50 MG PO TABS
50.0000 mg | ORAL_TABLET | Freq: Two times a day (BID) | ORAL | Status: DC
  Administered 2021-02-05 – 2021-03-07 (×60): 50 mg
  Filled 2021-02-05 (×40): qty 1
  Filled 2021-02-05: qty 2
  Filled 2021-02-05 (×7): qty 1
  Filled 2021-02-05: qty 2
  Filled 2021-02-05 (×9): qty 1
  Filled 2021-02-05: qty 2
  Filled 2021-02-05 (×2): qty 1

## 2021-02-05 MED ORDER — LEVETIRACETAM IN NACL 1000 MG/100ML IV SOLN
1000.0000 mg | Freq: Two times a day (BID) | INTRAVENOUS | Status: DC
  Administered 2021-02-05 – 2021-02-09 (×9): 1000 mg via INTRAVENOUS
  Filled 2021-02-05 (×9): qty 100

## 2021-02-05 MED ORDER — LACTATED RINGERS IV SOLN: INTRAVENOUS | Status: DC

## 2021-02-05 MED ORDER — FREE WATER
200.0000 mL | Status: DC
  Administered 2021-02-05 – 2021-02-06 (×6): 200 mL

## 2021-02-05 MED ORDER — AMLODIPINE BESYLATE 10 MG PO TABS
10.0000 mg | ORAL_TABLET | Freq: Every morning | ORAL | Status: DC
  Administered 2021-02-05 – 2021-02-25 (×20): 10 mg
  Filled 2021-02-05: qty 2
  Filled 2021-02-05 (×19): qty 1

## 2021-02-05 NOTE — H&P (Signed)
History and Physical    Joshua [name] [id number] DOB: [birth year] DOA: 02/04/2021  PCP: Patient, [name]   Patient coming from: Home  Chief Complaint:  seizure  HPI: Joshua [name] is a 40 y.o. 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 Presents today by EMS for concern for seizures. Patient at baseline is nonverbal with right hemiplegia and requires full assistance with ADLs, and is being cared for at home by family. There was a family member who had accompanied the patient but had left the hospital by the time I was there to see him. Reportedly family was concerned today as Joshua [name] had a leftward gaze, which usually happens when he has a seizure and the gaze did not break He continued to have a leftward gaze after arrival and he was given 1 dose of Ativan followed by a load of Keppra 2 g IV x1.  By the time of my evaluation, he had no gaze preference. Patient unable to provide any history.  Review of Systems:  Review of system cannot be obtained due to pt being aphasic at baseline  History reviewed. No pertinent past medical history.  Past Surgical History:  Procedure Laterality Date  . CRANIOTOMY Left 09/09/2019   Procedure: DECOMPRESSIVE CRANIECTOMY AND  HEMATOMA EVACUATION, IMPLANTATION OF SKULL FLAP IN RIGHT ABDOMEN;  Surgeon: Erline Levine, MD;  Location: [place];  Service: Neurosurgery;  Laterality: Left;  . ESOPHAGOGASTRODUODENOSCOPY (EGD) WITH PROPOFOL N/A 09/28/2019   Procedure: ESOPHAGOGASTRODUODENOSCOPY (EGD) WITH PROPOFOL;  Surgeon: Georganna Skeans, MD;  Location: [facility];  Service: General;  Laterality: N/A;  . PEG PLACEMENT N/A 09/28/2019   Procedure: PERCUTANEOUS  ENDOSCOPIC GASTROSTOMY (PEG) PLACEMENT;  Surgeon: Georganna Skeans, MD;  Location: [place];  Service: General;  Laterality: N/A;    Social History  reports that he has quit smoking. He has quit using smokeless tobacco. He reports current alcohol use. He reports previous drug use.  No Known Allergies  History reviewed. No pertinent family history.   Prior to Admission medications   Medication Sig Start Date End Date Taking? Authorizing Provider  acetaminophen (TYLENOL) 325 MG tablet Place 2 tablets (650 mg total) into feeding tube every 4 (four) hours as needed for mild pain (temp > 100.5). Patient not taking: Reported on 10/08/2020 05/01/20   Barb Merino, MD  Amino Acids-Protein Hydrolys (FEEDING SUPPLEMENT, PRO-STAT SUGAR FREE 64,) LIQD Place 30 mLs into feeding tube 4 (four) times daily. 05/01/20   Barb Merino, MD  amLODipine (NORVASC) 10 MG tablet Place 10 mg into feeding tube in the morning.    [provider]  baclofen (LIORESAL) 10 MG tablet Place 10 mg into feeding tube at bedtime.    [provider]  cloNIDine (CATAPRES - DOSED IN MG/24 HR) 0.3 mg/24hr patch Place 1 patch (0.3 mg total) onto the skin once a week. Patient not taking: Reported on 10/08/2020 07/10/20   Samella Parr, NP  levETIRAcetam (KEPPRA) 100 MG/ML solution Place 7.5 mLs (750 mg total) into feeding tube 2 (two) times daily. 07/04/20   Samella Parr, NP  LORazepam (ATIVAN) 2 MG/ML concentrated solution Place 0.5 mLs (1 mg total) into feeding tube daily as needed for seizure. 09/02/20   Oswald Hillock, MD  magnesium hydroxide (MILK OF MAGNESIA)  400 MG/5ML suspension Place 30 mLs into feeding tube every 12 (twelve) hours as needed for mild constipation ("for 48 hours"). Patient not taking: Reported on 10/08/2020    [provider]  metoprolol tartrate (LOPRESSOR) 50 MG tablet Place 1 tablet (50 mg total) into feeding tube 2 (two) times daily. 09/02/20   Oswald Hillock, MD  Multiple  Vitamin (MULTIVITAMIN WITH MINERALS) TABS tablet Take 1 tablet by mouth daily. Patient not taking: Reported on 10/08/2020 07/05/20   Samella Parr, NP  NON FORMULARY Take by mouth See admin instructions. Magic Cup Frozen Desserts- One cup by mouth two times a day- with lunch and supper Patient not taking: Reported on 10/08/2020    [provider]  Nutritional Supplements (FEEDING SUPPLEMENT, JEVITY 1.2 CAL,) LIQD Place 474 mLs into feeding tube in the morning, at noon, in the evening, and at bedtime. 05/01/20   Barb Merino, MD  polyethylene glycol (MIRALAX / GLYCOLAX) 17 g packet Place 17 g into feeding tube See admin instructions. Mix 17 grams into 4-8 ounces of water and administer, per tube, two times a day 10/08/20   Horald Pollen, MD  Water For Irrigation, Sterile (FREE WATER) SOLN Place 200 mLs into feeding tube every 4 (four) hours. Patient taking differently: Place 50-250 mLs into feeding tube See admin instructions. 50 ml's before and after each feeding and an additional 250 ml's every 6 hours for hydration 05/01/20   Barb Merino, MD    Physical Exam: Vitals:   02/04/21 2221 02/04/21 2315 02/05/21 0100  BP: (!) 117/94 130/86 (!) 145/105  Pulse: 89 96 77  Resp: 16 (!) 29 18  Temp: 98.2 F (36.8 C)    TempSrc: Oral    SpO2: 99% 99% 100%    Constitutional: NAD, calm, comfortable Vitals:   02/04/21 2221 02/04/21 2315 02/05/21 0100  BP: (!) 117/94 130/86 (!) 145/105  Pulse: 89 96 77  Resp: 16 (!) 29 18  Temp: 98.2 F (36.8 C)    TempSrc: Oral    SpO2: 99% 99% 100%   General:  Thin middle aged AA male  Eyes: PERRL, conjunctivae normal.  Sclera nonicteric HENT:  [place]/AT, external ears normal.  Nares patent without epistasis.  Mucous membranes are moist Neck: Soft, normal passive range of motion, supple, no masses, no thyromegaly.  Trachea midline Respiratory: clear to auscultation bilaterally, no wheezing, no crackles. Normal respiratory effort. No accessory  muscle use.  Cardiovascular: Regular rate and rhythm, no murmurs / rubs / gallops. No extremity edema. 2+ pedal pulses Abdomen: Soft, no tenderness, nondistended, no rebound or guarding.  No masses palpated. No hepatosplenomegaly. Bowel sounds normoactive Musculoskeletal: Cachectic. no cyanosis. No joint deformity upper and lower extremities. Normal muscle tone.  Skin: Warm, dry, intact no rashes, lesions, ulcers. No induration Neurologic: Contractures of upper extremities and increased muscle tone. Grimaces to painful stimuli but does not withdraw  Labs on Admission: I have personally reviewed following labs and imaging studies  CBC: Recent Labs  Lab 02/04/21 2244  WBC 7.0  NEUTROABS 3.7  HGB 14.6  HCT 47.5  MCV 82.8  PLT [id number]    Basic Metabolic Panel: Recent Labs  Lab 02/04/21 2244  NA 140  K 3.7  CL 104  CO2 25  GLUCOSE 111*  BUN 17  CREATININE 1.36*  CALCIUM 9.3    GFR: CrCl cannot be calculated (Unknown ideal weight.).  Liver Function Tests: No results for input(s): AST, ALT, ALKPHOS, BILITOT, PROT, ALBUMIN in the last 168  hours.  Urine analysis:    Component Value Date/Time   COLORURINE YELLOW 10/04/2020 1210   APPEARANCEUR HAZY (A) 10/04/2020 1210   LABSPEC 1.012 10/04/2020 1210   PHURINE 8.0 10/04/2020 1210   GLUCOSEU NEGATIVE 10/04/2020 1210   HGBUR NEGATIVE 10/04/2020 1210   BILIRUBINUR NEGATIVE 10/04/2020 1210   KETONESUR NEGATIVE 10/04/2020 1210   PROTEINUR NEGATIVE 10/04/2020 1210   UROBILINOGEN 1.0 04/05/2009 0021   NITRITE NEGATIVE 10/04/2020 1210   LEUKOCYTESUR TRACE (A) 10/04/2020 1210    Radiological Exams on Admission: CT Head Wo Contrast  Result Date: 02/04/2021 CLINICAL DATA:  Seizure EXAM: CT HEAD WITHOUT CONTRAST TECHNIQUE: Contiguous axial images were obtained from the base of the skull through the vertex without intravenous contrast. COMPARISON:  09/22/2020 FINDINGS: Brain: Large area of encephalomalacia in the left MCA territory  related to prior infarct. There is volume loss with ex vacuo dilatation of the lateral ventricles. No acute hemorrhage or extra-axial collection. Vascular: No abnormal hyperdensity of the major intracranial arteries or dural venous sinuses. No intracranial atherosclerosis. Skull: Remote left pterional craniectomy. Sinuses/Orbits: No fluid levels or advanced mucosal thickening of the visualized paranasal sinuses. No mastoid or middle ear effusion. The orbits are normal. IMPRESSION: 1. No acute intracranial abnormality. 2. 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 Electronically Signed   By: Ulyses Jarred M.D.   On: 02/04/2021 23:56   DG Chest Portable 1 View  Result Date: 02/05/2021 CLINICAL DATA:  Seizure EXAM: PORTABLE CHEST 1 VIEW COMPARISON:  None. FINDINGS: The heart size and mediastinal contours are within normal limits. Mildly hazy airspace opacity seen within the right mid and lower lung. No large airspace consolidation or pleural effusion. The visualized skeletal structures are unremarkable. IMPRESSION: Mild hazy airspace opacity within the right mid and lower lung which could be due to atelectasis and/or infectious etiology. Electronically Signed   By: Prudencio Pair M.D.   On: 02/05/2021 00:00    Assessment/Plan Principal Problem:   Seizure  Mr. Newnham is admitted to medical telemetry floor.  Was given loading dose of keppra 2000 mg in ER. Continue keppra 1000 mg bid.  Obtain EEG in am.  Neurology consulted and appreciate Dr. Johny Chess assistance with pt care.   Active Problems:   Essential hypertension Monitor BP. Resume home antihypertensives when pt more alert and able to take po.    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 Rory Percy Admission status:  Inpatient   Yevonne Aline Aziza Stuckert MD [facility]  How to contact the [facility] Attending or Consulting provider [place] or covering provider during after hours [place], for this patient?   1. Check the care team in [facility] and look for a) attending/consulting [facility] provider listed and b) the [facility] team listed 2. Log into [email or web address] and use [facility] universal password to access. If you do not have the password, please contact the hospital operator. 3. Locate the [facility] provider you are looking for under [facility] and page to a number that you can be directly reached. 4. If you still have difficulty reaching the provider, please page the  DOC (Director on Call) for the Hospitalists listed on amion for assistance.  02/05/2021, 1:59 AM

## 2021-02-05 NOTE — ED Notes (Signed)
Changed pt brief and bed linens with assistance of RN [employer]

## 2021-02-05 NOTE — Progress Notes (Addendum)
[facility]                                                                              Patient Demographics  Joshua [name], is a 40 y.o. male, DOB - 27-Sep-1981, [id number]  Admit date - 02/04/2021   Admitting Physician Eben Burow, MD  Outpatient Primary MD for the patient is Patient, [name]  Outpatient specialists:   LOS - 0  days   Medical records reviewed and are as summarized below:    Chief Complaint  Patient presents with  . 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  Presented by EMS for concern for seizures. Patient at baseline is nonverbal with right hemiplegia and requires full assistance with ADLs, and is being cared for at home by family.  Reportedly family was concerned today as Mr. Maslo had a leftward gaze, which usually happens when he has a seizure and the gaze did not break He continued to have a leftward gaze after arrival and he was given 1 dose of Ativan followed by a load of Keppra 2 g IV x1.   Assessment & Plan    Principal Problem:   Seizure ([place]) in the setting of large left ICH status post craniotomy, recurrent seizures -Awake but nonverbal, does not follow commands, presented with left gaze deviation, has resolved with Ativan and Keppra -Neurology was consulted, likely breakthrough seizure given large area of encephalomalacia in the left hemisphere -Keppra increased to 1 g twice daily.  Ativan only seizure> 5 minutes -Rule out any systemic infection, Obtain UA and culture -Chest x-ray showed mild hazy airspace opacity in the right mid and lower lung could be due to atelectasis and/or  infectious etiology.  No respiratory symptoms, no hypoxia fevers or leukocytosis. -COVID-19 test negative -EEG pending  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, history of large left ICH status post craniotomy -Currently at baseline, chronic    Essential hypertension, has history of PRES -Resume amlodipine, metoprolol -Added hydralazine IV as needed with parameters  CKD stage IIIa -Creatinine currently at baseline, follow  Nutrition/PEG tube -Currently somnolent, does not follow commands, Npo -Dietitian consult for initiating tube feeds, free water 200 cc every 4 hours -KVO IV fluids once tolerating tube feeds and free water initiated  Code Status: Full CODE STATUS DVT Prophylaxis:  SCDs Start: 02/05/21 0213   Level of Care: Level of care: Telemetry Medical Family Communication: Called patient's mother, Mrs [name], on phone number [phone] and updated today.  Disposition Plan:     Status is: Inpatient  Remains inpatient appropriate because:Inpatient level of care appropriate due to severity of illness   Dispo: The patient is from: Home              Anticipated d/c is to: tbd  Anticipated d/c date is: 2 days              Patient currently is not medically stable to d/c.   Difficult to place patient No      Time Spent in minutes   35 mins  Procedures:  none  Consultants:   Neurology   Antimicrobials:   Anti-infectives (From admission, onward)   None          Medications  Scheduled Meds: Continuous Infusions: . lactated ringers 75 mL/hr at 02/05/21 0345  . levETIRAcetam Stopped (02/05/21 [id number])   PRN Meds:.      Subjective:   Eri Schu was seen and examined today.  Unable to obtain review of system from the patient due to his nonverbal state.  Not following commands.  No ongoing seizures at the time of my examination.  Awake, nonverbal.  Appears to be comfortable, no pain.  No ongoing fever chills  diarrhea  Objective:   Vitals:   02/05/21 0500 02/05/21 0715 02/05/21 0800 02/05/21 0915  BP: (!) 131/108 (!) 151/99 (!) 150/114 (!) 151/105  Pulse: 76 85 90 87  Resp: '13 19 19 '$ (!) 21  Temp:      TempSrc:      SpO2: 100% 100% 99% 100%   No intake or output data in the 24 hours ending 02/05/21 1002   Wt Readings from Last 3 Encounters:  10/04/20 65.8 kg  09/22/20 61 kg  09/02/20 61 kg     Exam  General: Awake but nonverbal  Cardiovascular: S1 S2 auscultated, no murmurs, RRR  Respiratory: CTA B  Gastrointestinal: Soft, nontender, nondistended, + bowel sounds  Ext: no pedal edema bilaterally  Neuro: does not follow commands  Musculoskeletal: No digital cyanosis, clubbing  Skin: No rashes  Psych: nonverbal   Data Reviewed:  I have personally reviewed following labs and imaging studies  Micro Results Recent Results (from the past 240 hour(s))  Resp Panel by RT-PCR (Flu A&B, Covid) Nasopharyngeal Swab     Status: None   Collection Time: 02/05/21 12:57 AM   Specimen: Nasopharyngeal Swab; Nasopharyngeal(NP) swabs in vial transport medium  Result Value Ref Range Status   SARS Coronavirus 2 by RT PCR NEGATIVE NEGATIVE Final    Comment: (NOTE) SARS-CoV-2 target nucleic acids are NOT DETECTED.  The SARS-CoV-2 RNA is generally detectable in upper respiratory specimens during the acute phase of infection. The lowest concentration of SARS-CoV-2 viral copies this assay can detect is 138 copies/mL. A negative result does not preclude SARS-Cov-2 infection and should not be used as the sole basis for treatment or other patient management decisions. A negative result may occur with  improper specimen collection/handling, submission of specimen other than nasopharyngeal swab, presence of viral mutation(s) within the areas targeted by this assay, and inadequate number of viral copies(<138 copies/mL). A negative result must be combined with clinical observations, patient  history, and epidemiological information. The expected result is Negative.  Fact Sheet for Patients:  EntrepreneurPulse.com.au  Fact Sheet for Healthcare Providers:  IncredibleEmployment.be  This test is no t yet approved or cleared by the [place] FDA and  has been authorized for detection and/or diagnosis of SARS-CoV-2 by FDA under an Emergency Use Authorization (EUA). This EUA will remain  in effect (meaning this test can be used) for the duration of the COVID-19 declaration under Section 564(b)(1) of the Act, 21 U.S.C.section 360bbb-3(b)(1), unless the authorization is terminated  or revoked sooner.       Influenza A by  PCR NEGATIVE NEGATIVE Final   Influenza B by PCR NEGATIVE NEGATIVE Final    Comment: (NOTE) The Xpert Xpress SARS-CoV-2/FLU/RSV plus assay is intended as an aid in the diagnosis of influenza from Nasopharyngeal swab specimens and should not be used as a sole basis for treatment. Nasal washings and aspirates are unacceptable for Xpert Xpress SARS-CoV-2/FLU/RSV testing.  Fact Sheet for Patients: EntrepreneurPulse.com.au  Fact Sheet for Healthcare Providers: IncredibleEmployment.be  This test is not yet approved or cleared by the [place] FDA and has been authorized for detection and/or diagnosis of SARS-CoV-2 by FDA under an Emergency Use Authorization (EUA). This EUA will remain in effect (meaning this test can be used) for the duration of the COVID-19 declaration under Section 564(b)(1) of the Act, 21 U.S.C. section 360bbb-3(b)(1), unless the authorization is terminated or revoked.  Performed at [facility], [place] [address] [place], [place] [address]     Radiology Reports CT Head Wo Contrast  Result Date: 02/04/2021 CLINICAL DATA:  Seizure EXAM: CT HEAD WITHOUT CONTRAST TECHNIQUE: Contiguous axial images were obtained from the base of the skull through the vertex  without intravenous contrast. COMPARISON:  09/22/2020 FINDINGS: Brain: Large area of encephalomalacia in the left MCA territory related to prior infarct. There is volume loss with ex vacuo dilatation of the lateral ventricles. No acute hemorrhage or extra-axial collection. Vascular: No abnormal hyperdensity of the major intracranial arteries or dural venous sinuses. No intracranial atherosclerosis. Skull: Remote left pterional craniectomy. Sinuses/Orbits: No fluid levels or advanced mucosal thickening of the visualized paranasal sinuses. No mastoid or middle ear effusion. The orbits are normal. IMPRESSION: 1. No acute intracranial abnormality. 2. 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 Electronically Signed   By: Ulyses Jarred M.D.   On: 02/04/2021 23:56   DG Chest Portable 1 View  Result Date: 02/05/2021 CLINICAL DATA:  Seizure EXAM: PORTABLE CHEST 1 VIEW COMPARISON:  None. FINDINGS: The heart size and mediastinal contours are within normal limits. Mildly hazy airspace opacity seen within the right mid and lower lung. No large airspace consolidation or pleural effusion. The visualized skeletal structures are unremarkable. IMPRESSION: Mild hazy airspace opacity within the right mid and lower lung which could be due to atelectasis and/or infectious etiology. Electronically Signed   By: Prudencio Pair M.D.   On: 02/05/2021 00:00    Lab Data:  CBC: Recent Labs  Lab 02/04/21 2244 02/05/21 0333  WBC 7.0 5.7  NEUTROABS 3.7  --   HGB 14.6 14.1  HCT 47.5 45.7  MCV 82.8 83.9  PLT 270 [id number]   Basic Metabolic Panel: Recent Labs  Lab 02/04/21 2244 02/05/21 0333  NA 140 140  K 3.7 3.8  CL 104 106  CO2 25 25  GLUCOSE 111* 114*  BUN 17 17  CREATININE 1.36* 1.32*  CALCIUM 9.3 9.7   GFR: CrCl cannot be calculated (Unknown ideal weight.). Liver Function Tests: No results for input(s): AST, ALT, ALKPHOS, BILITOT, PROT, ALBUMIN in the last 168 hours. No  results for input(s): LIPASE, AMYLASE in the last 168 hours. No results for input(s): AMMONIA in the last 168 hours. Coagulation Profile: No results for input(s): INR, PROTIME in the last 168 hours. Cardiac Enzymes: No results for input(s): CKTOTAL, CKMB, CKMBINDEX, TROPONINI in the last 168 hours. BNP (last 3 results) No results for input(s): PROBNP in the last 8760 hours. HbA1C: No results for input(s): HGBA1C in the last 72 hours. CBG: Recent Labs  Lab 02/04/21  2320  GLUCAP 112*   Lipid Profile: No results for input(s): CHOL, HDL, LDLCALC, TRIG, CHOLHDL, LDLDIRECT in the last 72 hours. Thyroid Function Tests: No results for input(s): TSH, T4TOTAL, FREET4, T3FREE, THYROIDAB in the last 72 hours. Anemia Panel: No results for input(s): VITAMINB12, FOLATE, FERRITIN, TIBC, IRON, RETICCTPCT in the last 72 hours. Urine analysis:    Component Value Date/Time   COLORURINE YELLOW 10/04/2020 1210   APPEARANCEUR HAZY (A) 10/04/2020 1210   LABSPEC 1.012 10/04/2020 1210   PHURINE 8.0 10/04/2020 1210   GLUCOSEU NEGATIVE 10/04/2020 1210   HGBUR NEGATIVE 10/04/2020 1210   BILIRUBINUR NEGATIVE 10/04/2020 1210   KETONESUR NEGATIVE 10/04/2020 1210   PROTEINUR NEGATIVE 10/04/2020 1210   UROBILINOGEN 1.0 04/05/2009 0021   NITRITE NEGATIVE 10/04/2020 1210   LEUKOCYTESUR TRACE (A) 10/04/2020 1210     Karsin Pesta M.D. [facility] 02/05/2021, 10:02 AM   Call night coverage person covering after 7pm

## 2021-02-05 NOTE — Progress Notes (Signed)
Report obtained from ED RN Benjamine Mola, EVS still cleaning room at this time

## 2021-02-05 NOTE — Progress Notes (Signed)
Discontinuing EEG orders per Dr. Cheral Marker due to patient being to combative/not cooperative with EEG.

## 2021-02-05 NOTE — Progress Notes (Signed)
Unable to perform EEG due to patient hitting EEG tech multiple times while attempting hookup. MD notified.

## 2021-02-06 DIAGNOSIS — R4701 Aphasia: Secondary | ICD-10-CM | POA: Diagnosis not present

## 2021-02-06 DIAGNOSIS — G939 Disorder of brain, unspecified: Secondary | ICD-10-CM | POA: Diagnosis not present

## 2021-02-06 DIAGNOSIS — I10 Essential (primary) hypertension: Secondary | ICD-10-CM | POA: Diagnosis not present

## 2021-02-06 DIAGNOSIS — E43 Unspecified severe protein-calorie malnutrition: Secondary | ICD-10-CM

## 2021-02-06 DIAGNOSIS — R569 Unspecified convulsions: Secondary | ICD-10-CM

## 2021-02-06 LAB — CBC
HCT: 41.5 % (ref 39.0–52.0)
Hemoglobin: 13.5 g/dL (ref 13.0–17.0)
MCH: 26.4 pg (ref 26.0–34.0)
MCHC: 32.5 g/dL (ref 30.0–36.0)
MCV: 81.1 fL (ref 80.0–100.0)
Platelets: 260 10*3/uL (ref 150–400)
RBC: 5.12 MIL/uL (ref 4.22–5.81)
RDW: 15.2 % (ref 11.5–15.5)
WBC: 7.2 10*3/uL (ref 4.0–10.5)
nRBC: 0 % (ref 0.0–0.2)

## 2021-02-06 LAB — BASIC METABOLIC PANEL
Anion gap: 10 (ref 5–15)
BUN: 11 mg/dL (ref 6–20)
CO2: 27 mmol/L (ref 22–32)
Calcium: 9.5 mg/dL (ref 8.9–10.3)
Chloride: 100 mmol/L (ref 98–111)
Creatinine, Ser: 1.39 mg/dL — ABNORMAL HIGH (ref 0.61–1.24)
GFR, Estimated: >60 mL/min (ref >60)
Glucose, Bld: 112 mg/dL — ABNORMAL HIGH (ref 70–99)
Potassium: 3.3 mmol/L — ABNORMAL LOW (ref 3.5–5.1)
Sodium: 137 mmol/L (ref 135–145)

## 2021-02-06 LAB — PROCALCITONIN: Procalcitonin: <0.1 ng/mL

## 2021-02-06 MED ORDER — POTASSIUM CHLORIDE 10 MEQ/100ML IV SOLN
10.0000 meq | INTRAVENOUS | Status: AC
  Administered 2021-02-06 (×3): 10 meq via INTRAVENOUS
  Filled 2021-02-06 (×3): qty 100

## 2021-02-06 MED ORDER — HALOPERIDOL LACTATE 5 MG/ML IJ SOLN
0.5000 mg | Freq: Four times a day (QID) | INTRAMUSCULAR | Status: DC | PRN
  Administered 2021-02-14 – 2021-03-03 (×4): 0.5 mg via INTRAVENOUS
  Filled 2021-02-06 (×5): qty 1

## 2021-02-06 MED ORDER — HYDRALAZINE HCL 10 MG PO TABS
10.0000 mg | ORAL_TABLET | Freq: Three times a day (TID) | ORAL | Status: DC
  Administered 2021-02-06 – 2021-02-07 (×4): 10 mg
  Filled 2021-02-06 (×4): qty 1

## 2021-02-06 NOTE — Progress Notes (Signed)
Neurology Progress Note   S:// Seen and examined in follow-up. Agitated during the day and would not permit EEG. Seems back to his baseline   O:// Current vital signs: BP (!) 154/107 (BP Location: Left Leg)   Pulse 73   Temp 98.3 F (36.8 C) (Oral)   Resp 18   SpO2 100%  Vital signs in last 24 hours: Temp:  [98 F (36.7 C)-98.3 F (36.8 C)] 98.3 F (36.8 C) (02/10 0319) Pulse Rate:  [73-90] 73 (02/10 0319) Resp:  [12-21] 18 (02/10 0319) BP: (131-166)/(94-139) 154/107 (02/10 0319) SpO2:  [98 %-100 %] 100 % (02/10 0319)  General: Cachectic looking man, sleeping comfortably in bed HEENT: Left hemicraniectomy site with no flap and bulging, soft, nontender CVS: Regular rhythm Respiratory: Breathing well and saturating normally on room air Neurological exam Sleeping, does not open eyes to voice but resists passive eye opening Does not follow commands Pupils equal round reactive to light No forced gaze deviation or gaze preference Did not blink to threat from either side Completely nonverbal Increased tone and contractures in all fours worse than right. Does not spontaneously move extremity No significant movement to noxious stimulation either   Medications  Current Facility-Administered Medications:  .  amLODipine (NORVASC) tablet 10 mg, 10 mg, Per Tube, q AM, Rai, Ripudeep K, MD, 10 mg at 02/05/21 1212 .  free water 200 mL, 200 mL, Per Tube, Q4H, Rai, Ripudeep K, MD, 200 mL at 02/05/21 2306 .  hydrALAZINE (APRESOLINE) injection 10 mg, 10 mg, Intravenous, Q6H PRN, Rai, Ripudeep K, MD .  lactated ringers infusion, , Intravenous, Continuous, Rai, Ripudeep K, MD, Last Rate: 75 mL/hr at 02/05/21 2244, New Bag at 02/05/21 2244 .  levETIRAcetam (KEPPRA) IVPB 1000 mg/100 mL premix, 1,000 mg, Intravenous, Q12H, Chotiner, Yevonne Aline, MD, Last Rate: 400 mL/hr at 02/05/21 2249, 1,000 mg at 02/05/21 2249 .  metoprolol tartrate (LOPRESSOR) tablet 50 mg, 50 mg, Per Tube, BID, Rai,  Ripudeep K, MD, 50 mg at 02/05/21 2256 Labs CBC    Component Value Date/Time   WBC 5.7 02/05/2021 0333   RBC 5.45 02/05/2021 0333   HGB 14.1 02/05/2021 0333   HCT 45.7 02/05/2021 0333   PLT 279 02/05/2021 0333   MCV 83.9 02/05/2021 0333   MCH 25.9 (L) 02/05/2021 0333   MCHC 30.9 02/05/2021 0333   RDW 15.7 (H) 02/05/2021 0333   LYMPHSABS 2.3 02/04/2021 2244   MONOABS 0.5 02/04/2021 2244   EOSABS 0.4 02/04/2021 2244   BASOSABS 0.0 02/04/2021 2244    CMP     Component Value Date/Time   NA 140 02/05/2021 0333   K 3.8 02/05/2021 0333   CL 106 02/05/2021 0333   CO2 25 02/05/2021 0333   GLUCOSE 114 (H) 02/05/2021 0333   BUN 17 02/05/2021 0333   CREATININE 1.32 (H) 02/05/2021 0333   CALCIUM 9.7 02/05/2021 0333   PROT 7.9 10/04/2020 1144   ALBUMIN 3.6 10/04/2020 1144   AST 18 10/04/2020 1144   ALT 12 10/04/2020 1144   ALKPHOS 77 10/04/2020 1144   BILITOT 0.4 10/04/2020 1144   GFRNONAA >60 02/05/2021 0333   GFRAA >60 09/22/2020 1153    Imaging I have reviewed images in epic and the results pertinent to this consultation are: No new imaging to review  Assessment:  40 year old with prior large left ICH status post craniotomy with no wound flap, seizures and possible status abilities in the past presenting for concern for seizures.  At baseline very poor exam with  nonverbal, not following commands and right hemiparesis increased tone in all fours. Likely breakthrough seizure versus behavioral changes related to brain atrophy and encephalomalacia.  Recommendations: Continue Keppra 1 g twice daily Cancel EEG-not cooperative.  Clinically not appearing to be having seizure or an status epilepticus at this time. Ativan only seizure lasting more than 5 minutes-also call neurology at that time Infectious work-up revealed a questionable basilar atelectasis versus infection-management per primary team as you are.  UA pending-treatment per primary team if concerning for UTI. Neurology  will be available as needed-please call with questions.  -- Amie Portland, MD Neurologist [facility] Pager: [phone]

## 2021-02-06 NOTE — Plan of Care (Signed)

## 2021-02-06 NOTE — Progress Notes (Addendum)
[facility]                                                                              Patient Demographics  Joshua [name], is a 40 y.o. male, DOB - 1981/04/21, [id number]  Admit date - 02/04/2021   Admitting Physician Eben Burow, MD  Outpatient Primary MD for the patient is Patient, [name]  Outpatient specialists:   LOS - 1  days   Medical records reviewed and are as summarized below:    Chief Complaint  Patient presents with  . 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  Presented by EMS for concern for seizures. Patient at baseline is nonverbal with right hemiplegia and requires full assistance with ADLs, and is being cared for at home by family.  Reportedly family was concerned today as Joshua [name] had a leftward gaze, which usually happens when he has a seizure and the gaze did not break He continued to have a leftward gaze after arrival and he was given 1 dose of Ativan followed by a load of Keppra 2 g IV x1.   Assessment & Plan    Principal Problem:   Seizure ([place]) in the setting of large left ICH status post craniotomy, recurrent seizures -Much more alert and awake today, nonverbal at baseline.  Presented with left gaze deviation, has resolved with Ativan and Keppra  -Neurology was consulted, likely breakthrough seizure given large area of encephalomalacia in the left hemisphere - Keppra increased to 1 g twice daily.  Ativan only seizure> 5 minutes - Chest x-ray showed mild hazy airspace opacity in the right mid and lower lung could be due to atelectasis and/or infectious etiology.  No respiratory  symptoms -COVID-19 test negative.  Procalcitonin less than 0.1, no systemic signs of infection, hold off on antibiotics.   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, history of large left ICH status post craniotomy -Currently alert and awake, nonverbal at baseline, chronic    Essential hypertension, has history of PRES -BP elevated and not well controlled, continue amlodipine, metoprolol  -Added hydralazine 10 mg 3 times daily via tube, continue IV as needed with parameters  CKD stage IIIa -Creatinine continues to be at baseline, follow  Nutrition/PEG tube, dysphagia -SLP evaluation done, placed on dysphagia 2 diet with thin liquids, per mother eats normal food -Continue IV fluid hydration, tube feeds if patient is not eating  Hypokalemia -Replaced IV  Code Status: Full CODE STATUS DVT Prophylaxis:  SCDs Start: 02/05/21 0213   Level of Care: Level of care: Telemetry Medical Family Communication: Called patient's mother, Mrs [name], on phone number [phone] and updated on 2/9 and 2/10.  Patient's mother stated that patient currently lives with her sister and it has been difficult taking care of him.  She is requesting skilled nursing facility.  Placed PT OT, TOC consult.  She also wants him placed in [place], requested patient's mother to speak with SW regarding the logistics.  Disposition Plan:     Status is: Inpatient  Remains inpatient appropriate because:Inpatient level of care appropriate due to severity of illness   Dispo: The patient is from: Home              Anticipated d/c is to: tbd              Anticipated d/c date is: 2 days              Patient currently is not medically stable to d/c.     Difficult to place patient No      Time Spent in minutes   35 mins  Procedures:  none  Consultants:   Neurology   Antimicrobials:   Anti-infectives (From admission, onward)   None         Medications  Scheduled Meds: . amLODipine  10  mg Per Tube q AM  . free water  200 mL Per Tube Q4H  . metoprolol tartrate  50 mg Per Tube BID   Continuous Infusions: . lactated ringers 75 mL/hr at 02/05/21 2244  . levETIRAcetam 1,000 mg (02/06/21 0839)   PRN Meds:.      Subjective:   Joshua [name] was seen and examined today.  Alert and awake however nonverbal, unable to obtain review of system from the patient.  BP elevated.  Appears to be comfortable, no pain.  No further seizures.    Objective:   Vitals:   02/05/21 2342 02/06/21 0319 02/06/21 0726 02/06/21 1116  BP: (!) 166/111 (!) 154/107 (!) 153/105 (!) 137/96  Pulse: 78 73 80 75  Resp: '16 18 18 18  '$ Temp: 98.3 F (36.8 C) 98.3 F (36.8 C) 98.1 F (36.7 C) 98.9 F (37.2 C)  TempSrc: Oral Oral Axillary Oral  SpO2: 98% 100% 100% 100%    Intake/Output Summary (Last 24 hours) at 02/06/2021 1253 Last data filed at 02/06/2021 0600 Gross per 24 hour  Intake 1810.43 ml  Output 300 ml  Net 1510.43 ml     Wt Readings from Last 3 Encounters:  10/04/20 65.8 kg  09/22/20 61 kg  09/02/20 61 kg   Physical Exam  General: Alert and awake, NAD, nonverbal at baseline  Cardiovascular: S1 S2 clear, RRR. No pedal edema b/l  Respiratory: CTA B  Gastrointestinal: Soft, nontender, nondistended, NBS  Ext: no pedal edema bilaterally  Neuro: does not follow commands  Musculoskeletal: No cyanosis, clubbing  Skin: No rashes  Psych: nonverbal   Data Reviewed:  I have personally reviewed following labs and imaging studies  Micro Results Recent Results (from the past 240 hour(s))  Resp Panel by RT-PCR (Flu A&B, Covid) Nasopharyngeal Swab     Status: None   Collection Time: 02/05/21 12:57 AM   Specimen: Nasopharyngeal Swab; Nasopharyngeal(NP) swabs in vial transport medium  Result Value Ref Range Status   SARS Coronavirus 2 by RT PCR NEGATIVE NEGATIVE Final    Comment: (NOTE) SARS-CoV-2 target nucleic acids are NOT DETECTED.  The SARS-CoV-2 RNA is generally  detectable in upper respiratory specimens during the acute phase of infection. The lowest concentration of SARS-CoV-2 viral copies this assay can detect is 138 copies/mL. A negative result does not preclude SARS-Cov-2 infection and should not be used as the sole basis for treatment or other patient management decisions. A negative result may occur with  improper specimen collection/handling,  submission of specimen other than nasopharyngeal swab, presence of viral mutation(s) within the areas targeted by this assay, and inadequate number of viral copies(<138 copies/mL). A negative result must be combined with clinical observations, patient history, and epidemiological information. The expected result is Negative.  Fact Sheet for Patients:  EntrepreneurPulse.com.au  Fact Sheet for Healthcare Providers:  IncredibleEmployment.be  This test is no t yet approved or cleared by the [place] FDA and  has been authorized for detection and/or diagnosis of SARS-CoV-2 by FDA under an Emergency Use Authorization (EUA). This EUA will remain  in effect (meaning this test can be used) for the duration of the COVID-19 declaration under Section 564(b)(1) of the Act, 21 U.S.C.section 360bbb-3(b)(1), unless the authorization is terminated  or revoked sooner.       Influenza A by PCR NEGATIVE NEGATIVE Final   Influenza B by PCR NEGATIVE NEGATIVE Final    Comment: (NOTE) The Xpert Xpress SARS-CoV-2/FLU/RSV plus assay is intended as an aid in the diagnosis of influenza from Nasopharyngeal swab specimens and should not be used as a sole basis for treatment. Nasal washings and aspirates are unacceptable for Xpert Xpress SARS-CoV-2/FLU/RSV testing.  Fact Sheet for Patients: EntrepreneurPulse.com.au  Fact Sheet for Healthcare Providers: IncredibleEmployment.be  This test is not yet approved or cleared by the [place] FDA  and has been authorized for detection and/or diagnosis of SARS-CoV-2 by FDA under an Emergency Use Authorization (EUA). This EUA will remain in effect (meaning this test can be used) for the duration of the COVID-19 declaration under Section 564(b)(1) of the Act, 21 U.S.C. section 360bbb-3(b)(1), unless the authorization is terminated or revoked.  Performed at [facility], [place] [address] [place], [place] [address]     Radiology Reports CT Head Wo Contrast  Result Date: 02/04/2021 CLINICAL DATA:  Seizure EXAM: CT HEAD WITHOUT CONTRAST TECHNIQUE: Contiguous axial images were obtained from the base of the skull through the vertex without intravenous contrast. COMPARISON:  09/22/2020 FINDINGS: Brain: Large area of encephalomalacia in the left MCA territory related to prior infarct. There is volume loss with ex vacuo dilatation of the lateral ventricles. No acute hemorrhage or extra-axial collection. Vascular: No abnormal hyperdensity of the major intracranial arteries or dural venous sinuses. No intracranial atherosclerosis. Skull: Remote left pterional craniectomy. Sinuses/Orbits: No fluid levels or advanced mucosal thickening of the visualized paranasal sinuses. No mastoid or middle ear effusion. The orbits are normal. IMPRESSION: 1. No acute intracranial abnormality. 2. 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 Electronically Signed   By: Ulyses Jarred M.D.   On: 02/04/2021 23:56   DG Chest Portable 1 View  Result Date: 02/05/2021 CLINICAL DATA:  Seizure EXAM: PORTABLE CHEST 1 VIEW COMPARISON:  None. FINDINGS: The heart size and mediastinal contours are within normal limits. Mildly hazy airspace opacity seen within the right mid and lower lung. No large airspace consolidation or pleural effusion. The visualized skeletal structures are unremarkable. IMPRESSION: Mild hazy airspace opacity within the right mid and lower lung which could be due to  atelectasis and/or infectious etiology. Electronically Signed   By: Prudencio Pair M.D.   On: 02/05/2021 00:00    Lab Data:  CBC: Recent Labs  Lab 02/04/21 2244 02/05/21 0333 02/06/21 0345  WBC 7.0 5.7 7.2  NEUTROABS 3.7  --   --   HGB 14.6 14.1 13.5  HCT 47.5 45.7 41.5  MCV 82.8 83.9 81.1  PLT 270 279 260   Basic  Metabolic Panel: Recent Labs  Lab 02/04/21 2244 02/05/21 0333 02/06/21 0345  NA 140 140 137  K 3.7 3.8 3.3*  CL 104 106 100  CO2 '25 25 27  '$ GLUCOSE 111* 114* 112*  BUN '17 17 11  '$ CREATININE 1.36* 1.32* 1.39*  CALCIUM 9.3 9.7 9.5   GFR: CrCl cannot be calculated (Unknown ideal weight.). Liver Function Tests: No results for input(s): AST, ALT, ALKPHOS, BILITOT, PROT, ALBUMIN in the last 168 hours. No results for input(s): LIPASE, AMYLASE in the last 168 hours. No results for input(s): AMMONIA in the last 168 hours. Coagulation Profile: No results for input(s): INR, PROTIME in the last 168 hours. Cardiac Enzymes: No results for input(s): CKTOTAL, CKMB, CKMBINDEX, TROPONINI in the last 168 hours. BNP (last 3 results) No results for input(s): PROBNP in the last 8760 hours. HbA1C: No results for input(s): HGBA1C in the last 72 hours. CBG: Recent Labs  Lab 02/04/21 2320  GLUCAP 112*   Lipid Profile: No results for input(s): CHOL, HDL, LDLCALC, TRIG, CHOLHDL, LDLDIRECT in the last 72 hours. Thyroid Function Tests: No results for input(s): TSH, T4TOTAL, FREET4, T3FREE, THYROIDAB in the last 72 hours. Anemia Panel: No results for input(s): VITAMINB12, FOLATE, FERRITIN, TIBC, IRON, RETICCTPCT in the last 72 hours. Urine analysis:    Component Value Date/Time   COLORURINE YELLOW 10/04/2020 1210   APPEARANCEUR HAZY (A) 10/04/2020 1210   LABSPEC 1.012 10/04/2020 1210   PHURINE 8.0 10/04/2020 1210   GLUCOSEU NEGATIVE 10/04/2020 1210   HGBUR NEGATIVE 10/04/2020 1210   BILIRUBINUR NEGATIVE 10/04/2020 1210   KETONESUR NEGATIVE 10/04/2020 1210   PROTEINUR  NEGATIVE 10/04/2020 1210   UROBILINOGEN 1.0 04/05/2009 0021   NITRITE NEGATIVE 10/04/2020 1210   LEUKOCYTESUR TRACE (A) 10/04/2020 1210     Dvante Hands M.D. [facility] 02/06/2021, 12:53 PM   Call night coverage person covering after 7pm

## 2021-02-06 NOTE — Evaluation (Signed)
Clinical/Bedside Swallow Evaluation Patient Details  Name: Joshua [name] MRN: [id number] Date of Birth: [birth year]  Today's Date: 02/06/2021 Time: SLP Start Time (ACUTE ONLY): 0910 SLP Stop Time (ACUTE ONLY): 1000 SLP Time Calculation (min) (ACUTE ONLY): 50 min  Past Medical History: History reviewed. No pertinent past medical history. Past Surgical History:  Past Surgical History:  Procedure Laterality Date  . CRANIOTOMY Left 09/09/2019   Procedure: DECOMPRESSIVE CRANIECTOMY AND  HEMATOMA EVACUATION, IMPLANTATION OF SKULL FLAP IN RIGHT ABDOMEN;  Surgeon: Erline Levine, MD;  Location: [facility];  Service: Neurosurgery;  Laterality: Left;  . ESOPHAGOGASTRODUODENOSCOPY (EGD) WITH PROPOFOL N/A 09/28/2019   Procedure: ESOPHAGOGASTRODUODENOSCOPY (EGD) WITH PROPOFOL;  Surgeon: Georganna Skeans, MD;  Location: [facility] ENDOSCOPY;  Service: General;  Laterality: N/A;  . PEG PLACEMENT N/A 09/28/2019   Procedure: PERCUTANEOUS ENDOSCOPIC GASTROSTOMY (PEG) PLACEMENT;  Surgeon: Georganna Skeans, MD;  Location[facility] ENDOSCOPY;  Service: General;  Laterality: N/A;   HPI:  HPI: Joshua [name] is a 40 y.o. 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 Presents today by EMS for concern for seizures. Patient at baseline is nonverbal with right hemiplegia and requires full assistance with ADLs, and is being cared for at home by family. CT of head without acute intracranial abnormality. 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 Chest x ray with mild hazy airspace opacity within the right mid and lower lung which could be due to atelectasis and/or infectious etiology   Assessment / Plan /  Recommendation Clinical Impression  POs at bedside somewhat limited due to patients altered cognition from prior ICH. Called patients mother and family to gather more information regarding swallow hx. Pt with prior swallow work up 2021, revealing intact pharyngeal function and mild oral deficits. This date pt with labial tension required max cues for small sips of thin liquids. Anterior spillage noted. Pt did have palpable swallow. Delayed cough noted in 1/5 trials. Pt consumed small bite of puree via teapsoon however continued to require max cues. Attempted solid PO, pt expectorated orally. Per family report, pt consumes regular thin liquid diet at home, when PO intake is reduced, family utilizes PEG with boost. PEG also utilized for meds crushed. Recommend dysphagia 2 (finely chopped) diet and thin liquids. SLP to follow up.      Aspiration Risk  Mild aspiration risk;Moderate aspiration risk    Diet Recommendation   Dysphagia 2 (finely chopped) thin liquids   Medication Administration: Via alternative means    Other  Recommendations Oral Care Recommendations: Oral care BID   Follow up Recommendations 24 hour supervision/assistance      Frequency and Duration min 1 x/week  1 week       Prognosis Prognosis for Safe Diet Advancement: Fair Barriers to Reach Goals: Cognitive deficits;Severity of deficits;Language deficits      Swallow Study   General Date of Onset: 02/05/21 HPI: HPI: Joshua [name] is a 40 y.o. 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 Presents today by EMS for concern for seizures. Patient at baseline is  nonverbal with right hemiplegia and requires full assistance with ADLs, and  is being cared for at home by family. CT of head without acute intracranial abnormality. 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 Chest x ray with mild hazy airspace opacity within the right mid and lower lung which could be due to atelectasis and/or infectious etiology Type of Study: Bedside Swallow Evaluation Previous Swallow Assessment: prior swallow workup 2021 unremarkable Diet Prior to this Study: NPO Temperature Spikes Noted: No Respiratory Status: Room air History of Recent Intubation: No Behavior/Cognition: Alert;Doesn't follow directions Oral Cavity Assessment: Dry Oral Care Completed by SLP: Other (Comment) (pt resistive unable to complete diligent oral care despite attempts) Oral Cavity - Dentition: Other (Comment);Missing dentition (some dentition noted, pt unable to open mouth fully due to cognitive deficits) Vision: Impaired for self-feeding Self-Feeding Abilities: Total assist Patient Positioning: Upright in bed Baseline Vocal Quality: Low vocal intensity Volitional Cough: Weak;Congested Volitional Swallow: Unable to elicit    Oral/Motor/Sensory Function Overall Oral Motor/Sensory Function: Other (comment) (unable to fully assess due to pt inability to follow commands)   Ice Chips Ice chips: Not tested (pt refused)   Thin Liquid Thin Liquid: Impaired Presentation: Cup Oral Phase Impairments: Reduced labial seal;Poor awareness of bolus Oral Phase Functional Implications: Prolonged oral transit;Right anterior spillage;Left anterior spillage Pharyngeal  Phase Impairments: Suspected delayed Swallow;Multiple swallows;Cough - Delayed    Nectar Thick Nectar Thick Liquid: Not tested   Honey Thick Honey Thick Liquid: Not tested   Puree Puree: Impaired Presentation: Spoon Oral Phase Impairments: Reduced labial seal;Reduced lingual movement/coordination Oral Phase Functional Implications: Oral residue;Prolonged oral transit Pharyngeal  Phase Impairments: Suspected delayed Swallow;Multiple swallows   Solid     Solid: Not tested      Hayden Rasmussen MA, CCC-SLP Acute Rehabilitation Services   02/06/2021,10:12 AM

## 2021-02-07 DIAGNOSIS — I10 Essential (primary) hypertension: Secondary | ICD-10-CM | POA: Diagnosis not present

## 2021-02-07 DIAGNOSIS — G939 Disorder of brain, unspecified: Secondary | ICD-10-CM | POA: Diagnosis not present

## 2021-02-07 DIAGNOSIS — R4701 Aphasia: Secondary | ICD-10-CM | POA: Diagnosis not present

## 2021-02-07 DIAGNOSIS — E43 Unspecified severe protein-calorie malnutrition: Secondary | ICD-10-CM | POA: Diagnosis not present

## 2021-02-07 LAB — CBC
HCT: 41 % (ref 39.0–52.0)
Hemoglobin: 13.6 g/dL (ref 13.0–17.0)
MCH: 26.6 pg (ref 26.0–34.0)
MCHC: 33.2 g/dL (ref 30.0–36.0)
MCV: 80.1 fL (ref 80.0–100.0)
Platelets: 271 10*3/uL (ref 150–400)
RBC: 5.12 MIL/uL (ref 4.22–5.81)
RDW: 15.1 % (ref 11.5–15.5)
WBC: 8.5 10*3/uL (ref 4.0–10.5)
nRBC: 0 % (ref 0.0–0.2)

## 2021-02-07 LAB — BASIC METABOLIC PANEL
Anion gap: 12 (ref 5–15)
BUN: 9 mg/dL (ref 6–20)
CO2: 24 mmol/L (ref 22–32)
Calcium: 9.7 mg/dL (ref 8.9–10.3)
Chloride: 100 mmol/L (ref 98–111)
Creatinine, Ser: 1.44 mg/dL — ABNORMAL HIGH (ref 0.61–1.24)
GFR, Estimated: >60 mL/min (ref >60)
Glucose, Bld: 88 mg/dL (ref 70–99)
Potassium: 3.5 mmol/L (ref 3.5–5.1)
Sodium: 136 mmol/L (ref 135–145)

## 2021-02-07 LAB — GLUCOSE, CAPILLARY
Glucose-Capillary: 118 mg/dL — ABNORMAL HIGH (ref 70–99)
Glucose-Capillary: 122 mg/dL — ABNORMAL HIGH (ref 70–99)

## 2021-02-07 LAB — PHOSPHORUS: Phosphorus: 3.5 mg/dL (ref 2.5–4.6)

## 2021-02-07 LAB — MAGNESIUM: Magnesium: 1.7 mg/dL (ref 1.7–2.4)

## 2021-02-07 MED ORDER — PROSOURCE TF PO LIQD
45.0000 mL | Freq: Every day | ORAL | Status: DC
  Administered 2021-02-07 – 2021-02-13 (×7): 45 mL
  Filled 2021-02-07 (×7): qty 45

## 2021-02-07 MED ORDER — JEVITY 1.2 CAL PO LIQD
1000.0000 mL | ORAL | Status: DC
  Administered 2021-02-07: 40 mL
  Filled 2021-02-07: qty 1000

## 2021-02-07 MED ORDER — FREE WATER
200.0000 mL | Status: DC
  Administered 2021-02-07 – 2021-03-05 (×154): 200 mL

## 2021-02-07 MED ORDER — OSMOLITE 1.5 CAL PO LIQD
1000.0000 mL | ORAL | Status: DC
  Administered 2021-02-07 – 2021-03-04 (×22): 1000 mL
  Filled 2021-02-07 (×17): qty 1000

## 2021-02-07 MED ORDER — HYDRALAZINE HCL 25 MG PO TABS
25.0000 mg | ORAL_TABLET | Freq: Three times a day (TID) | ORAL | Status: DC
  Administered 2021-02-07 – 2021-03-07 (×76): 25 mg
  Filled 2021-02-07 (×80): qty 1

## 2021-02-07 NOTE — Progress Notes (Signed)
Occupational Therapy Evaluation Patient Details Name: Joshua [name] MRN: [id number] DOB: Feb 03, 1981 Today's Date: 02/07/2021    History of Present Illness Pt is a 40 y.o. male who presents with concerns for seizures. CT negative for acute intracranial abnormalities. 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   Clinical Impression   Pt received in bed. Per chart, PTA, pt was living at home with his aunt. Pt was dependent for all ADL and is bed bound. Pt currently appears to be at baseline. He is dependent with all ADL, requires totalA+2 for bed mobility, modA-maxA for stability sitting EOB. Pt presents with RUE flexion contracture and BLE contractures. Per chart, family requesting pt d/c to SNF due to family having difficulty caring for pt at home. Recommend d/c to SNF. Pt functioning at baseline, no additional OT needs identified at this time. OT will sign off. Thank you for referral.     Follow Up Recommendations  SNF    Equipment Recommendations       Recommendations for Other Services       Precautions / Restrictions Precautions Precautions: Fall Precaution Comments: seizures; at risk for skin breakdown and contractures Restrictions Weight Bearing Restrictions: No      Mobility Bed Mobility Overal bed mobility: Needs Assistance Bed Mobility: Rolling;Sidelying to Sit;Sit to Supine Rolling: Total assist;+2 for physical assistance;+2 for safety/equipment Sidelying to sit: Total assist;+2 for physical assistance;+2 for safety/equipment   Sit to supine: Total assist;+2 for physical assistance;+2 for safety/equipment   General bed mobility comments: Cued pt to reach L UE across body and push through L foot on bed, but min activation/assist from pt thus TA to roll to R. TAx2 to perform all bed mobility.    Transfers                  General transfer comment: Deferred    Balance Overall balance assessment: Needs assistance Sitting-balance support: Single extremity supported;Feet supported Sitting balance-Leahy Scale: Poor Sitting balance - Comments: Initially with strong posterior lean needing TA to maintain static sitting balance but once physically broke strong extension and pt more upright he progressed to only needing modA with moments of brief minA. Cued pt to grab bed or end of bed with L hand for stability. Needed up to maxA as pt fatigued and started to lean more posteriorly and to R after sitting EOB ~10 min. Postural control: Posterior lean;Right lateral lean                                 ADL either performed or assessed with clinical judgement   ADL Overall ADL's : At baseline                                       General ADL Comments: pt dependent in all ADL     Vision         Perception     Praxis      Pertinent Vitals/Pain Pain Assessment: Faces Faces Pain Scale: Hurts even more Pain Location: Limbs with PROM Pain Descriptors / Indicators: Discomfort;Grimacing;Guarding Pain Intervention(s): Limited activity within patient's tolerance;Monitored during session;Repositioned     Hand Dominance     Extremity/Trunk Assessment Upper Extremity Assessment Upper Extremity Assessment:  RUE deficits/detail;LUE deficits/detail RUE Deficits / Details: Strength and ROM deficits with pt holding R arm in flexion synergy with contractures limiting R elbow PROM extension to ~80' and opening of R hand with PROM to less than mid-way RUE Coordination: decreased fine motor;decreased gross motor LUE Deficits / Details: Generalizes weakness and coordination deficits impacting use for mobility and ADLs LUE Coordination: decreased fine motor;decreased gross motor   Lower Extremity Assessment Lower Extremity Assessment: RLE deficits/detail;LLE deficits/detail RLE Deficits /  Details: Generalized weakness with contractures. PROM knee extension ~90'. PROM ankle dorsiflexion ~neutral. PROM hip abduction lacking ~10-20' to obtain neutral as he holds them in adduction RLE Coordination: decreased fine motor;decreased gross motor LLE Deficits / Details: Generalized weakness with contractures. PROM knee extension ~110'. PROM ankle dorsiflexion ~neutral. PROM hip abduction lacking ~10-20' to obtain neutral as he holds them in adduction LLE Coordination: decreased fine motor;decreased gross motor       Communication Communication Communication: Receptive difficulties;Expressive difficulties (non-verbal but does point to express desires on occasion; unsure of pt's receptive understanding)   Cognition Arousal/Alertness: Awake/alert Behavior During Therapy: Flat affect (ignoring therapists at times) Overall Cognitive Status: History of cognitive impairments - at baseline                                 General Comments: Pt able to express desires with hand gestures on occasion. Pt with flat affect and intermittently ignoring therapist, likely purposefully.   General Comments  pt toenails long and appears to be painful for pt, notified  RN to contact wound care for maangement of nails    Exercises     Shoulder Instructions      Home Living Family/patient expects to be discharged to:: Skilled nursing facility                                 Additional Comments: Pt's family requesting pt d/c to SNF as they have been unable to provide required level of care at home.      Prior Functioning/Environment Level of Independence: Needs assistance  Gait / Transfers Assistance Needed: bed bound, assisted for transfers/mobility ADL's / Homemaking Assistance Needed: fed via PEG; per chart review, dependent in ADLs            OT Problem List: Impaired balance (sitting and/or standing)      OT Treatment/Interventions:      OT Goals(Current  goals can be found in the care plan section) Acute Rehab OT Goals Patient Stated Goal: gestured to return to bed OT Goal Formulation: With patient Time For Goal Achievement: 02/21/21 Potential to Achieve Goals: Fair  OT Frequency:     Barriers to D/C:            Co-evaluation PT/OT/SLP Co-Evaluation/Treatment: Yes Reason for Co-Treatment: Complexity of the patient's impairments (multi-system involvement);For patient/therapist safety;To address functional/ADL transfers PT goals addressed during session: Mobility/safety with mobility;Balance;Strengthening/ROM OT goals addressed during session: ADL's and self-care      AM-PAC OT "6 Clicks" Daily Activity     Outcome Measure Help from another person eating meals?: Total Help from another person taking care of personal grooming?: Total Help from another person toileting, which includes using toliet, bedpan, or urinal?: Total Help from another person bathing (including washing, rinsing, drying)?: Total Help from another person to put on and taking off regular upper body clothing?: Total Help  from another person to put on and taking off regular lower body clothing?: Total 6 Click Score: 6   End of Session Nurse Communication: Mobility status  Activity Tolerance: Patient tolerated treatment well Patient left: in bed;with call bell/phone within reach;with bed alarm set;with restraints reapplied (mitt applied)  OT Visit Diagnosis: Other abnormalities of gait and mobility (R26.89)                Time: 1431-1500 OT Time Calculation (min): 29 min Charges:  OT General Charges $OT Visit: 1 Visit OT Evaluation $OT Eval Moderate Complexity: [place] OTR/L Acute Rehabilitation Services Office: [place] 02/07/2021, 3:46 PM

## 2021-02-07 NOTE — Plan of Care (Signed)
Problem: Education: Goal: Knowledge of General Education information will improve Description: Including pain rating scale, medication(s)/side effects and non-pharmacologic comfort measures Outcome: Progressing   Problem: Health Behavior/Discharge Planning: Goal: Ability to manage health-related needs will improve Outcome: Progressing   Problem: Clinical Measurements: Goal: Ability to maintain clinical measurements within normal limits will improve Outcome: Progressing Goal: Will remain free from infection Outcome: Progressing Goal: Diagnostic test results will improve Outcome: Progressing Goal: Respiratory complications will improve Outcome: Progressing Goal: Cardiovascular complication will be avoided Outcome: Progressing   Problem: Activity: Goal: Risk for activity intolerance will decrease Outcome: Progressing   Problem: Nutrition: Goal: Adequate nutrition will be maintained Outcome: Progressing   Problem: Coping: Goal: Level of anxiety will decrease Outcome: Progressing   Problem: Elimination: Goal: Will not experience complications related to bowel motility Outcome: Progressing Goal: Will not experience complications related to urinary retention Outcome: Progressing   Problem: Pain Managment: Goal: General experience of comfort will improve Outcome: Progressing   Problem: Safety: Goal: Ability to remain free from injury will improve Outcome: Progressing   Problem: Skin Integrity: Goal: Risk for impaired skin integrity will decrease Outcome: Progressing   Problem: Education: Goal: Expressions of having a comfortable level of knowledge regarding the disease process will increase Outcome: Progressing   Problem: Coping: Goal: Ability to adjust to condition or change in health will improve Outcome: Progressing Goal: Ability to identify appropriate support needs will improve Outcome: Progressing   Problem: Health Behavior/Discharge Planning: Goal:  Compliance with prescribed medication regimen will improve Outcome: Progressing   Problem: Medication: Goal: Risk for medication side effects will decrease Outcome: Progressing   Problem: Clinical Measurements: Goal: Complications related to the disease process, condition or treatment will be avoided or minimized Outcome: Progressing Goal: Diagnostic test results will improve Outcome: Progressing   Problem: Safety: Goal: Verbalization of understanding the information provided will improve Outcome: Progressing   Problem: Self-Concept: Goal: Level of anxiety will decrease Outcome: Progressing Goal: Ability to verbalize feelings about condition will improve Outcome: Progressing   Problem: Education: Goal: Knowledge of General Education information will improve Description: Including pain rating scale, medication(s)/side effects and non-pharmacologic comfort measures Outcome: Progressing   Problem: Health Behavior/Discharge Planning: Goal: Ability to manage health-related needs will improve Outcome: Progressing   Problem: Clinical Measurements: Goal: Ability to maintain clinical measurements within normal limits will improve Outcome: Progressing Goal: Will remain free from infection Outcome: Progressing Goal: Diagnostic test results will improve Outcome: Progressing Goal: Respiratory complications will improve Outcome: Progressing Goal: Cardiovascular complication will be avoided Outcome: Progressing   Problem: Activity: Goal: Risk for activity intolerance will decrease Outcome: Progressing   Problem: Nutrition: Goal: Adequate nutrition will be maintained Outcome: Progressing   Problem: Coping: Goal: Level of anxiety will decrease Outcome: Progressing   Problem: Elimination: Goal: Will not experience complications related to bowel motility Outcome: Progressing Goal: Will not experience complications related to urinary retention Outcome: Progressing   Problem:  Pain Managment: Goal: General experience of comfort will improve Outcome: Progressing   Problem: Safety: Goal: Ability to remain free from injury will improve Outcome: Progressing   Problem: Skin Integrity: Goal: Risk for impaired skin integrity will decrease Outcome: Progressing   Problem: Education: Goal: Expressions of having a comfortable level of knowledge regarding the disease process will increase Outcome: Progressing   Problem: Coping: Goal: Ability to adjust to condition or change in health will improve Outcome: Progressing Goal: Ability to identify appropriate support needs will improve Outcome: Progressing   Problem: Health Behavior/Discharge Planning: Goal: Compliance  with prescribed medication regimen will improve Outcome: Progressing   Problem: Medication: Goal: Risk for medication side effects will decrease Outcome: Progressing   Problem: Clinical Measurements: Goal: Complications related to the disease process, condition or treatment will be avoided or minimized Outcome: Progressing Goal: Diagnostic test results will improve Outcome: Progressing   Problem: Safety: Goal: Verbalization of understanding the information provided will improve Outcome: Progressing   Problem: Self-Concept: Goal: Level of anxiety will decrease Outcome: Progressing Goal: Ability to verbalize feelings about condition will improve Outcome: Progressing

## 2021-02-07 NOTE — NC FL2 (Signed)
[place] LEVEL OF CARE SCREENING TOOL     IDENTIFICATION  [name] Birthdate: [birth year] Sex: male Admission Date (Current Location): 02/04/2021  [facility] and [place] Number:  Herbalist and Address:  [facility] [place], [place] [address] [place],  [address]      Provider Number: [id number]  Attending Physician Name and Address:  Mendel Corning, MD  Relative Name and Phone Number:       Current Level of Care: Hospital Recommended Level of Care: [place] Prior Approval Number:    Date Approved/Denied:   PASRR Number: [id number]  Discharge Plan: SNF    Current Diagnoses: Patient Active Problem List   Diagnosis Date Noted  . Essential hypertension 02/05/2021  . Status epilepticus ([place])   . PRES (posterior reversible encephalopathy syndrome)   . Hypertensive urgency 07/05/2020  . Noncompliance with medication treatment due to intermittent use of medication 07/05/2020  . 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 PEG (percutaneous endoscopic gastrostomy) status ([place])   . Palliative care by specialist   . Goals of care, counseling/discussion   . Acute kidney injury ([place])   . Malignant hypertension 10/07/2019  . Severe protein-calorie malnutrition ([place]) 10/07/2019  . CKD (chronic kidney disease), stage IV ([place]) 10/07/2019  . Seizure ([place]) 10/07/2019  . Acute respiratory failure with hypoxia and hypercapnia ([facility])   . Hemorrhagic stroke ([place])   . Cytotoxic brain edema ([place]) 09/10/2019  . ICH (intracerebral hemorrhage) ([place]) 09/09/2019  . Subdural hematoma ([place]) 09/09/2019  . Intracranial hemorrhage ([place]) 09/09/2019    Orientation RESPIRATION BLADDER Height & Weight     Self  Normal Incontinent Weight:   Height:  6' (182.9 cm)  BEHAVIORAL SYMPTOMS/MOOD NEUROLOGICAL BOWEL NUTRITION STATUS      Incontinent Feeding tube (Jevity 1.2)  AMBULATORY STATUS COMMUNICATION OF NEEDS Skin    Extensive Assist Non-Verbally Normal                       Personal Care Assistance Level of Assistance  Bathing,Feeding,Dressing Bathing Assistance: Maximum assistance Feeding assistance: Maximum assistance Dressing Assistance: Maximum assistance     Functional Limitations Info  Speech     Speech Info: Impaired (incomprehensible)    SPECIAL CARE FACTORS FREQUENCY                       Contractures Contractures Info: Not present    Additional Factors Info  Code Status,Allergies Code Status Info: Full Allergies Info: NKA           Current Medications (02/07/2021):  This is the current hospital active medication list Current Facility-Administered Medications  Medication Dose Route Frequency Provider Last Rate Last Admin  . amLODipine (NORVASC) tablet 10 mg  10 mg Per Tube q AM Rai, Ripudeep K, MD   10 mg at 02/07/21 0658  . feeding supplement (JEVITY 1.2 CAL) liquid 1,000 mL  1,000 mL Per Tube Continuous Rai, Ripudeep K, MD      . free water 200 mL  200 mL Per Tube Q4H Rai, Ripudeep K, MD      . haloperidol lactate (HALDOL) injection 0.5 mg  0.5 mg Intravenous Q6H PRN Rai, Ripudeep K, MD      . hydrALAZINE (APRESOLINE) injection 10 mg  10 mg Intravenous Q6H PRN Rai, Ripudeep K, MD      . hydrALAZINE (APRESOLINE) tablet 10 mg  10 mg Per Tube Q8H Rai, Ripudeep  K, MD   10 mg at 02/07/21 0658  . lactated ringers infusion   Intravenous Continuous Rai, Ripudeep K, MD 75 mL/hr at 02/07/21 0445 New Bag at 02/07/21 0445  . levETIRAcetam (KEPPRA) IVPB 1000 mg/100 mL premix  1,000 mg Intravenous Q12H Chotiner, Yevonne Aline, MD 400 mL/hr at 02/07/21 0744 1,000 mg at 02/07/21 0744  . metoprolol tartrate (LOPRESSOR) tablet 50 mg  50 mg Per Tube BID Rai, Ripudeep K, MD   50 mg at 02/06/21 2158     Discharge Medications: Please see discharge summary for a list of discharge medications.  Relevant Imaging Results:  Relevant Lab Results:   Additional Information SS#:  [id number]  Geralynn Ochs, LCSW

## 2021-02-07 NOTE — Progress Notes (Signed)
[facility]                                                                              Patient Demographics  Criston Bokor, is a 40 y.o. male, DOB - [birth year], [id number]  Admit date - 02/04/2021   Admitting Physician Eben Burow, MD  Outpatient Primary MD for the patient is Patient, [name]  Outpatient specialists:   LOS - 2  days   Medical records reviewed and are as summarized below:    Chief Complaint  Patient presents with  . 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  Presented by EMS for concern for seizures. Patient at baseline is nonverbal with right hemiplegia and requires full assistance with ADLs, and is being cared for at home by family.  Reportedly family was concerned today as Mr. Tetter had a leftward gaze, which usually happens when he has a seizure and the gaze did not break He continued to have a leftward gaze after arrival and he was given 1 dose of Ativan followed by a load of Keppra 2 g IV x1.   Assessment & Plan    Principal Problem:   Seizure ([place]) in the setting of large left ICH status post craniotomy, recurrent seizures -Much more alert and awake today, nonverbal at baseline.  Presented with left gaze deviation, has resolved with Ativan and Keppra  -Neurology was consulted, likely breakthrough seizure given large area of encephalomalacia in the left hemisphere - Chest x-ray showed mild hazy airspace opacity in the right mid and lower lung could be due to atelectasis and/or infectious etiology.  No respiratory symptoms -COVID-19 test negative.  Procalcitonin less than 0.1, no systemic  signs of infection, hold off on antibiotics. -No repeat seizures, continue increased dose Keppra 1 g twice daily. 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, history of large left ICH status post craniotomy -Currently alert and awake, nonverbal at baseline, chronic    Essential hypertension, has history of PRES -BP remains elevated, continue amlodipine, metoprolol, increase hydralazine to 25 mg q8hrs  -Continue IV hydralazine as needed with parameters  CKD stage IIIa -Creatinine continues to be at baseline, follow  Nutrition/PEG tube, dysphagia, moderate protein calorie malnutrition -SLP evaluation done, placed on dysphagia 2 diet with thin liquids, per mother eats normal food -Patient is not eating at all, placed on tube feeds today, Jevity 1.2 Cal, free water 200 cc every 4 hours, dietitian consulted  Hypokalemia -Replaced IV  Code Status: Full CODE STATUS DVT Prophylaxis:  SCDs Start: 02/05/21 0213   Level of Care: Level of care: Telemetry Medical Family Communication: Called patient's mother, Mrs [name], on phone number [phone] and updated on 2/9 and 2/10.  Patient's mother stated that patient currently lives with  her sister and it has been difficult taking care of him.  She is requesting skilled nursing facility.  She also wants him placed in [place], requested patient's mother to speak with SW regarding the logistics. TOC consulted for placement.  Disposition Plan:     Status is: Inpatient  Remains inpatient appropriate because:Inpatient level of care appropriate due to severity of illness   Dispo: The patient is from: Home              Anticipated d/c is to: tbd              Anticipated d/c date is: 2 days              Patient currently is not medically stable to d/c.     Difficult to place patient No      Time Spent in minutes   35 mins  Procedures:  none  Consultants:   Neurology    Antimicrobials:   Anti-infectives (From admission, onward)   None         Medications  Scheduled Meds: . amLODipine  10 mg Per Tube q AM  . feeding supplement (PROSource TF)  45 mL Per Tube Daily  . free water  200 mL Per Tube Q4H  . hydrALAZINE  10 mg Per Tube Q8H  . metoprolol tartrate  50 mg Per Tube BID   Continuous Infusions: . feeding supplement (OSMOLITE 1.5 CAL)    . lactated ringers 75 mL/hr at 02/07/21 0445  . levETIRAcetam 1,000 mg (02/07/21 0744)   PRN Meds:.      Subjective:   Jacary Reffner was seen and examined today. Alert and awake, nonverbal, patient not eating at all per RN. Appears to be comfortable no pain. No repeat seizures. BP still uncontrolled.    Objective:   Vitals:   02/06/21 2350 02/07/21 0401 02/07/21 0819 02/07/21 1239  BP: (!) 152/122 (!) 148/111 (!) 151/114 (!) 149/116  Pulse: 74 64 97 100  Resp: '19 16 16 18  '$ Temp: 98.1 F (36.7 C) 98.3 F (36.8 C) 98.9 F (37.2 C) 99.2 F (37.3 C)  TempSrc: Oral Oral Oral Oral  SpO2: 100% 96% 100% 99%  Height:        Intake/Output Summary (Last 24 hours) at 02/07/2021 1405 Last data filed at 02/07/2021 1239 Gross per 24 hour  Intake 591.84 ml  Output 2025 ml  Net -1433.16 ml     Wt Readings from Last 3 Encounters:  10/04/20 65.8 kg  09/22/20 61 kg  09/02/20 61 kg   Physical Exam  General: Awake, alert, NAD, nonverbal  Cardiovascular: S1 S2 clear, RRR. No pedal edema b/l  Respiratory: CTAB, no wheezing, rales or rhonchi  Gastrointestinal: Soft, nontender, nondistended, NBS  Ext: no pedal edema bilaterally  Neuro: does not follow commands  Musculoskeletal: No cyanosis, clubbing  Skin: No rashes  Psych: nonverbal    Data Reviewed:  I have personally reviewed following labs and imaging studies  Micro Results Recent Results (from the past 240 hour(s))  Resp Panel by RT-PCR (Flu A&B, Covid) Nasopharyngeal Swab     Status: None   Collection Time: 02/05/21 12:57 AM    Specimen: Nasopharyngeal Swab; Nasopharyngeal(NP) swabs in vial transport medium  Result Value Ref Range Status   SARS Coronavirus 2 by RT PCR NEGATIVE NEGATIVE Final    Comment: (NOTE) SARS-CoV-2 target nucleic acids are NOT DETECTED.  The SARS-CoV-2 RNA is generally detectable in upper respiratory specimens during the acute phase of  infection. The lowest concentration of SARS-CoV-2 viral copies this assay can detect is 138 copies/mL. A negative result does not preclude SARS-Cov-2 infection and should not be used as the sole basis for treatment or other patient management decisions. A negative result may occur with  improper specimen collection/handling, submission of specimen other than nasopharyngeal swab, presence of viral mutation(s) within the areas targeted by this assay, and inadequate number of viral copies(<138 copies/mL). A negative result must be combined with clinical observations, patient history, and epidemiological information. The expected result is Negative.  Fact Sheet for Patients:  EntrepreneurPulse.com.au  Fact Sheet for Healthcare Providers:  IncredibleEmployment.be  This test is no t yet approved or cleared by the [place] FDA and  has been authorized for detection and/or diagnosis of SARS-CoV-2 by FDA under an Emergency Use Authorization (EUA). This EUA will remain  in effect (meaning this test can be used) for the duration of the COVID-19 declaration under Section 564(b)(1) of the Act, 21 U.S.C.section 360bbb-3(b)(1), unless the authorization is terminated  or revoked sooner.       Influenza A by PCR NEGATIVE NEGATIVE Final   Influenza B by PCR NEGATIVE NEGATIVE Final    Comment: (NOTE) The Xpert Xpress SARS-CoV-2/FLU/RSV plus assay is intended as an aid in the diagnosis of influenza from Nasopharyngeal swab specimens and should not be used as a sole basis for treatment. Nasal washings and aspirates are  unacceptable for Xpert Xpress SARS-CoV-2/FLU/RSV testing.  Fact Sheet for Patients: EntrepreneurPulse.com.au  Fact Sheet for Healthcare Providers: IncredibleEmployment.be  This test is not yet approved or cleared by the [place] FDA and has been authorized for detection and/or diagnosis of SARS-CoV-2 by FDA under an Emergency Use Authorization (EUA). This EUA will remain in effect (meaning this test can be used) for the duration of the COVID-19 declaration under Section 564(b)(1) of the Act, 21 U.S.C. section 360bbb-3(b)(1), unless the authorization is terminated or revoked.  Performed at [facility], [place] [address] [place], [place] [address]     Radiology Reports CT Head Wo Contrast  Result Date: 02/04/2021 CLINICAL DATA:  Seizure EXAM: CT HEAD WITHOUT CONTRAST TECHNIQUE: Contiguous axial images were obtained from the base of the skull through the vertex without intravenous contrast. COMPARISON:  09/22/2020 FINDINGS: Brain: Large area of encephalomalacia in the left MCA territory related to prior infarct. There is volume loss with ex vacuo dilatation of the lateral ventricles. No acute hemorrhage or extra-axial collection. Vascular: No abnormal hyperdensity of the major intracranial arteries or dural venous sinuses. No intracranial atherosclerosis. Skull: Remote left pterional craniectomy. Sinuses/Orbits: No fluid levels or advanced mucosal thickening of the visualized paranasal sinuses. No mastoid or middle ear effusion. The orbits are normal. IMPRESSION: 1. No acute intracranial abnormality. 2. 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 Electronically Signed   By: Ulyses Jarred M.D.   On: 02/04/2021 23:56   DG Chest Portable 1 View  Result Date: 02/05/2021 CLINICAL DATA:  Seizure EXAM: PORTABLE CHEST 1 VIEW COMPARISON:  None. FINDINGS: The heart size and mediastinal contours are within normal limits.  Mildly hazy airspace opacity seen within the right mid and lower lung. No large airspace consolidation or pleural effusion. The visualized skeletal structures are unremarkable. IMPRESSION: Mild hazy airspace opacity within the right mid and lower lung which could be due to atelectasis and/or infectious etiology. Electronically Signed   By: Prudencio Pair M.D.   On: 02/05/2021 00:00    Lab Data:  CBC: Recent Labs  Lab 02/04/21 2244 02/05/21 0333 02/06/21 0345 02/07/21 0249  WBC 7.0 5.7 7.2 8.5  NEUTROABS 3.7  --   --   --   HGB 14.6 14.1 13.5 13.6  HCT 47.5 45.7 41.5 41.0  MCV 82.8 83.9 81.1 80.1  PLT 270 279 260 [id number]   Basic Metabolic Panel: Recent Labs  Lab 02/04/21 2244 02/05/21 0333 02/06/21 0345 02/07/21 0249  NA 140 140 137 136  K 3.7 3.8 3.3* 3.5  CL 104 106 100 100  CO2 '25 25 27 24  '$ GLUCOSE 111* 114* 112* 88  BUN '17 17 11 9  '$ CREATININE 1.36* 1.32* 1.39* 1.44*  CALCIUM 9.3 9.7 9.5 9.7   GFR: CrCl cannot be calculated (Unknown ideal weight.). Liver Function Tests: No results for input(s): AST, ALT, ALKPHOS, BILITOT, PROT, ALBUMIN in the last 168 hours. No results for input(s): LIPASE, AMYLASE in the last 168 hours. No results for input(s): AMMONIA in the last 168 hours. Coagulation Profile: No results for input(s): INR, PROTIME in the last 168 hours. Cardiac Enzymes: No results for input(s): CKTOTAL, CKMB, CKMBINDEX, TROPONINI in the last 168 hours. BNP (last 3 results) No results for input(s): PROBNP in the last 8760 hours. HbA1C: No results for input(s): HGBA1C in the last 72 hours. CBG: Recent Labs  Lab 02/04/21 2320  GLUCAP 112*   Lipid Profile: No results for input(s): CHOL, HDL, LDLCALC, TRIG, CHOLHDL, LDLDIRECT in the last 72 hours. Thyroid Function Tests: No results for input(s): TSH, T4TOTAL, FREET4, T3FREE, THYROIDAB in the last 72 hours. Anemia Panel: No results for input(s): VITAMINB12, FOLATE, FERRITIN, TIBC, IRON, RETICCTPCT in the last 72  hours. Urine analysis:    Component Value Date/Time   COLORURINE YELLOW 10/04/2020 1210   APPEARANCEUR HAZY (A) 10/04/2020 1210   LABSPEC 1.012 10/04/2020 1210   PHURINE 8.0 10/04/2020 1210   GLUCOSEU NEGATIVE 10/04/2020 1210   HGBUR NEGATIVE 10/04/2020 1210   BILIRUBINUR NEGATIVE 10/04/2020 1210   KETONESUR NEGATIVE 10/04/2020 1210   PROTEINUR NEGATIVE 10/04/2020 1210   UROBILINOGEN 1.0 04/05/2009 0021   NITRITE NEGATIVE 10/04/2020 1210   LEUKOCYTESUR TRACE (A) 10/04/2020 1210     Kelby Lotspeich M.D. [facility] 02/07/2021, 2:05 PM   Call night coverage person covering after 7pm

## 2021-02-07 NOTE — TOC Initial Note (Signed)
Transition of Care [facility]) - Initial/Assessment Note    Patient Details  Name: Joshua [name] MRN: [id number] Date of Birth: September 18, 1981  Transition of [facility]) CM/SW Contact:    Geralynn Ochs, LCSW Phone Number: 02/07/2021, 2:16 PM  Clinical Narrative:     CSW notified by MD that family is seeking to have the patient placed in a nursing home, they have not been able to manage him at home. CSW reached out and spoke with patient's mother, [name]. Patient had previously been at [facility], but the family did not like it. The family would like to have the patient in [facility], where the mother is. CSW then spoke with patient's sister, [name], who had already been researching facility options for the patient. [name] provided the names of 5 SNFs for CSW to reach out to in [facility] for placement. CSW has also faxed the patient out in the [place] area; no bed offers at this time. CSW to follow.           Expected Discharge Plan: Long Term Nursing Home Barriers to Discharge: No SNF bed   Patient Goals and CMS Choice Patient states their goals for this hospitalization and ongoing recovery are:: patient unable to participate in goal setting, only oriented to self CMS Medicare.gov Compare Post Acute Care list provided to:: Patient Choice offered to / list presented to : Patient  Expected Discharge Plan and Services Expected Discharge Plan: [place]     Post Acute Care Choice: Nursing Home Living arrangements for the past 2 months: Single Family Home                                      Prior Living Arrangements/Services Living arrangements for the past 2 months: Single Family Home Lives with:: Relatives Patient language and need for interpreter reviewed:: No Do you feel safe going back to the place where you live?: Yes      Need for Family Participation in Patient Care: Yes (Comment) Care giver support system in place?: No (comment)   Criminal Activity/Legal Involvement  Pertinent to Current Situation/Hospitalization: No - Comment as needed  Activities of Daily Living      Permission Sought/Granted Permission sought to share information with : Facility Retail banker granted to share information with : Yes, Verbal Permission Granted  Share Information with NAME: [name], [name]  Permission granted to share info w AGENCY: SNF  Permission granted to share info w Relationship: Mom, Sister     Emotional Assessment   Attitude/Demeanor/Rapport: Unable to Assess Affect (typically observed): Unable to Assess Orientation: : Oriented to Self Alcohol / Substance Use: Not Applicable Psych Involvement: No (comment)  Admission diagnosis:  Seizure [facility]) [R56.9] Patient Active Problem List   Diagnosis Date Noted  . Essential hypertension 02/05/2021  . Status epilepticus ([place])   . PRES (posterior reversible encephalopathy syndrome)   . Hypertensive urgency 07/05/2020  . Noncompliance with medication treatment due to intermittent use of medication 07/05/2020  . 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 PEG (percutaneous endoscopic gastrostomy) status ([place])   . Palliative care by specialist   . Goals of care, counseling/discussion   . Acute kidney injury ([place])   . Malignant hypertension 10/07/2019  . Severe protein-calorie malnutrition ([place]) 10/07/2019  . CKD (chronic kidney disease), stage IV ([place]) 10/07/2019  . Seizure ([place]) 10/07/2019  . Acute respiratory failure with hypoxia and hypercapnia (  [place])   . Hemorrhagic stroke ([place])   . Cytotoxic brain edema ([place]) 09/10/2019  . ICH (intracerebral hemorrhage) ([place]) 09/09/2019  . Subdural hematoma ([place]) 09/09/2019  . Intracranial hemorrhage ([place]) 09/09/2019   PCP:  Patient, [name] Pharmacy:   [id number] [place] [place] [address]Phone: [phone]:  [phone]    Social Determinants of Health (SDOH) Interventions    Readmission Risk Interventions Readmission Risk Prevention Plan 07/04/2020  Transportation Screening Complete  PCP or Specialist Appt within 5-7 Days Complete  Home Care Screening Complete  Medication Review (RN CM) Complete  Some recent data might be hidden

## 2021-02-07 NOTE — Progress Notes (Addendum)
Initial Nutrition Assessment  DOCUMENTATION CODES:   Not applicable  INTERVENTION:  Obtain new measured weight  Begin TF via PEG -Begin Osmolite 1.5 @ 63m/hr, advance 14mhr Q12H until goal rate of 6028mr (1440m46ms reached -45ml69msource TF daily  At goal, TF provides 2200 kcals, 101g protein, 1097ml 93m water Meets 100% of needs  NUTRITION DIAGNOSIS:   Inadequate oral intake related to dysphagia as evidenced by meal completion < 25%.    GOAL:   Patient will meet greater than or equal to 90% of their needs    MONITOR:   TF tolerance,Labs,Weight trends,I & O's  REASON FOR ASSESSMENT:   Consult Assessment of nutrition requirement/status,Enteral/tube feeding initiation and management  ASSESSMENT:   Pt admitted with seizure in the setting of large L ICH s/p craniotomy, recurrent seizures. PMH includes large ICH (08/2019) requiring hemicraniectomy and evacuation followed by extensive hospitalization; pt discharged in May 2021. Pt has since experienced the development of seizures resulting in multiple ER visits/hospital admissions for concern for seizures/seizure-like activity  Pt is nonverbal at baseline with R hemiplegia and requires full assistance with all ADLs. Pt's family members serve as primary caregivers. Note family has stated that it has been challenging to care for pt and are now requesting SNF.   Pt known to Clinical Nutrition staff from previous admissions. Although this pt was placed on a dysphagia 2 diet after SLP evaluation, it should be noted that pt has accepted little to no POs while admitted and refused nearly all POs during previous admissions. MD consulted RD to order TF for pt given refusal of POs.   MD ordered the following to be administered via PEG: Jevity 1.2 @ 40ml/h73mvance 10ml/hr20mift to goal 60ml/hr 67m 200ml free69mer Q4H. Per RN, TF currently running at 40ml/hr.  34mill adjust TF orders to better meet pt's needs.  Reviewed  weight history. No updated weight taken since 10/04/20. Recommend obtaining new weight. Note pt previously met criteria for moderate malnutrition and likely still does; however, unable to diagnose without nutrition-focused physical exam.   UOP: 1250ml x24 ho67m Labs: Cr 1.44 (H, slightly increased from yesterday) Medications reviewed.  IVF: LR @ 75ml/hr  [id number] - FOCUSED PHYSICAL EXAM: RD unable to perform at this time; working remotely from another campus. Will attempt at follow-up.   Diet Order:   Diet Order            DIET DYS 2 Room service appropriate? Yes; Fluid consistency: Thin  Diet effective now                 EDUCATION NEEDS:   No education needs have been identified at this time  Skin:  Skin Assessment: Reviewed RN Assessment  Last BM:  PTA  Height:   Ht Readings from Last 1 Encounters:  02/06/21 6' (1.829 m)    Weight:   Wt Readings from Last 1 Encounters:  10/04/20 65.8 kg   BMI:  Body mass index is 19.67 kg/m.  Estimated Nutritional Needs:   Kcal:  2000-2200  Protein:  100-115 grams  Fluid:  >2L    Joshua [name] AveretLarkin Ina RD pager number and weekend/on-call pager number located in [email or web address]

## 2021-02-07 NOTE — Evaluation (Signed)
Physical Therapy Evaluation & Discharge Patient Details Name: Joshua [name] MRN: [id number] DOB: 22-Dec-1981 Today's Date: 02/07/2021   History of Present Illness  Pt is a 40 y.o. male who presents with concerns for seizures. CT negative for acute intracranial abnormalities. 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  Clinical Impression  Pt presents with condition above and deficits mentioned below, see PT Problem List. Most recently, pt has been living at home being cared for by his family and is bed bound with assistance for all functional mobility and ADLs, per chart review. Pt appears to be at his baseline per chart review of current admission and prior admissions. He continues to display contractures, coordination deficits, and generalized weakness that impact his mobility. PT will sign off at this time. Recommending pt d/c to SNF per family request as they have been having difficulty managing the pt at home.    Follow Up Recommendations SNF (as family unable to provide needed level of care)    Equipment Recommendations  Other (comment) (defer to next venue)    Recommendations for Other Services       Precautions / Restrictions Precautions Precautions: Fall Precaution Comments: seizures; at risk for skin breakdown and contractures Restrictions Weight Bearing Restrictions: No      Mobility  Bed Mobility Overal bed mobility: Needs Assistance Bed Mobility: Rolling;Sidelying to Sit;Sit to Supine Rolling: Total assist;+2 for physical assistance;+2 for safety/equipment Sidelying to sit: Total assist;+2 for physical assistance;+2 for safety/equipment   Sit to supine: Total assist;+2 for physical assistance;+2 for safety/equipment   General bed mobility comments: Cued pt to reach L UE across body and push through L foot on bed, but min  activation/assist from pt thus TA to roll to R. TAx2 to perform all bed mobility.    Transfers                 General transfer comment: Deferred  Ambulation/Gait             General Gait Details: Unable to at baseline.  Stairs            Wheelchair Mobility    Modified Rankin (Stroke Patients Only) Modified Rankin (Stroke Patients Only) Pre-Morbid Rankin Score: Severe disability Modified Rankin: Severe disability     Balance Overall balance assessment: Needs assistance Sitting-balance support: Single extremity supported;Feet supported Sitting balance-Leahy Scale: Poor Sitting balance - Comments: Initially with strong posterior lean needing TA to maintain static sitting balance but once physically broke strong extension and pt more upright he progressed to only needing modA with moments of brief minA. Cued pt to grab bed or end of bed with L hand for stability. Needed up to maxA as pt fatigued and started to lean more posteriorly and to R after sitting EOB ~10 min. Postural control: Posterior lean;Right lateral lean                                   Pertinent Vitals/Pain Pain Assessment: Faces Faces Pain Scale: Hurts even more Pain Location: Limbs with PROM Pain Descriptors / Indicators: Discomfort;Grimacing;Guarding Pain Intervention(s): Limited activity within patient's tolerance;Monitored during session;Repositioned    Home Living Family/patient expects to be discharged to:: Skilled nursing facility                 Additional Comments:  Pt's family requesting pt d/c to SNF as they have been unable to provide required level of care at home.    Prior Function Level of Independence: Needs assistance   Gait / Transfers Assistance Needed: bed bound, assisted for transfers/mobility  ADL's / Homemaking Assistance Needed: fed via PEG; per chart review, dependent in ADLs        Hand Dominance        Extremity/Trunk Assessment    Upper Extremity Assessment Upper Extremity Assessment: RUE deficits/detail;LUE deficits/detail RUE Deficits / Details: Strength and ROM deficits with pt holding R arm in flexion synergy with contractures limiting R elbow PROM extension to ~80' and opening of R hand with PROM to less than mid-way RUE Coordination: decreased fine motor;decreased gross motor LUE Deficits / Details: Generalizes weakness and coordination deficits impacting use for mobility and ADLs LUE Coordination: decreased fine motor;decreased gross motor    Lower Extremity Assessment Lower Extremity Assessment: RLE deficits/detail;LLE deficits/detail RLE Deficits / Details: Generalized weakness with contractures. PROM knee extension ~90'. PROM ankle dorsiflexion ~neutral. PROM hip abduction lacking ~10-20' to obtain neutral as he holds them in adduction RLE Coordination: decreased fine motor;decreased gross motor LLE Deficits / Details: Generalized weakness with contractures. PROM knee extension ~110'. PROM ankle dorsiflexion ~neutral. PROM hip abduction lacking ~10-20' to obtain neutral as he holds them in adduction LLE Coordination: decreased fine motor;decreased gross motor       Communication   Communication: Receptive difficulties;Expressive difficulties (non-verbal but does point to express desires on occasion; unsure of pt's receptive understanding)  Cognition Arousal/Alertness: Awake/alert Behavior During Therapy: Flat affect (ignoring therapists at times) Overall Cognitive Status: History of cognitive impairments - at baseline                                 General Comments: Pt able to express desires with hand gestures on occasion. Pt with flat affect and intermittently ignoring therapist, likely purposefully.      General Comments General comments (skin integrity, edema, etc.): Long toenails, discussed need for wound care management of nails with RN    Exercises     Assessment/Plan    PT  Assessment All further PT needs can be met in the next venue of care (no further acute PT needs)  PT Problem List Decreased strength;Decreased range of motion;Decreased activity tolerance;Decreased balance;Decreased mobility;Decreased coordination;Decreased cognition;Decreased knowledge of use of DME;Decreased safety awareness;Decreased knowledge of precautions;Impaired tone;Decreased skin integrity       PT Treatment Interventions      PT Goals (Current goals can be found in the Care Plan section)  Acute Rehab PT Goals Patient Stated Goal: gestured to return to bed PT Goal Formulation: Patient unable to participate in goal setting Time For Goal Achievement: 02/09/21 Potential to Achieve Goals: Fair    Frequency     Barriers to discharge        Co-evaluation PT/OT/SLP Co-Evaluation/Treatment: Yes Reason for Co-Treatment: Complexity of the patient's impairments (multi-system involvement);Necessary to address cognition/behavior during functional activity;For patient/therapist safety;To address functional/ADL transfers PT goals addressed during session: Mobility/safety with mobility;Balance;Strengthening/ROM         AM-PAC PT "6 Clicks" Mobility  Outcome Measure Help needed turning from your back to your side while in a flat bed without using bedrails?: Total Help needed moving from lying on your back to sitting on the side of a flat bed without using bedrails?: Total Help needed moving to and from a  bed to a chair (including a wheelchair)?: Total Help needed standing up from a chair using your arms (e.g., wheelchair or bedside chair)?: Total Help needed to walk in hospital room?: Total Help needed climbing 3-5 steps with a railing? : Total 6 Click Score: 6    End of Session   Activity Tolerance: Patient limited by fatigue Patient left: in bed;with call bell/phone within reach;with bed alarm set Nurse Communication: Mobility status;Other (comment) (need for toenail  management) PT Visit Diagnosis: Muscle weakness (generalized) (M62.81)    Time: 1431-1500 PT Time Calculation (min) (ACUTE ONLY): 29 min   Charges:   PT Evaluation $PT Eval Moderate Complexity: 1 Mod          Moishe Spice, PT, DPT Acute Rehabilitation Services  Pager: [phone] Office: [phone]   Orvan Falconer 02/07/2021, 3:18 PM

## 2021-02-08 DIAGNOSIS — I10 Essential (primary) hypertension: Secondary | ICD-10-CM | POA: Diagnosis not present

## 2021-02-08 DIAGNOSIS — G939 Disorder of brain, unspecified: Secondary | ICD-10-CM | POA: Diagnosis not present

## 2021-02-08 DIAGNOSIS — R4701 Aphasia: Secondary | ICD-10-CM | POA: Diagnosis not present

## 2021-02-08 DIAGNOSIS — E43 Unspecified severe protein-calorie malnutrition: Secondary | ICD-10-CM | POA: Diagnosis not present

## 2021-02-08 LAB — GLUCOSE, CAPILLARY
Glucose-Capillary: 116 mg/dL — ABNORMAL HIGH (ref 70–99)
Glucose-Capillary: 125 mg/dL — ABNORMAL HIGH (ref 70–99)
Glucose-Capillary: 127 mg/dL — ABNORMAL HIGH (ref 70–99)
Glucose-Capillary: 129 mg/dL — ABNORMAL HIGH (ref 70–99)
Glucose-Capillary: 135 mg/dL — ABNORMAL HIGH (ref 70–99)
Glucose-Capillary: 147 mg/dL — ABNORMAL HIGH (ref 70–99)

## 2021-02-08 LAB — CBC
HCT: 41.3 % (ref 39.0–52.0)
Hemoglobin: 14 g/dL (ref 13.0–17.0)
MCH: 26.7 pg (ref 26.0–34.0)
MCHC: 33.9 g/dL (ref 30.0–36.0)
MCV: 78.8 fL — ABNORMAL LOW (ref 80.0–100.0)
Platelets: 270 10*3/uL (ref 150–400)
RBC: 5.24 MIL/uL (ref 4.22–5.81)
RDW: 15.1 % (ref 11.5–15.5)
WBC: 7.4 10*3/uL (ref 4.0–10.5)
nRBC: 0 % (ref 0.0–0.2)

## 2021-02-08 LAB — MAGNESIUM
Magnesium: 1.9 mg/dL (ref 1.7–2.4)
Magnesium: 1.8 mg/dL (ref 1.7–2.4)

## 2021-02-08 LAB — PHOSPHORUS
Phosphorus: 3.8 mg/dL (ref 2.5–4.6)
Phosphorus: 3.4 mg/dL (ref 2.5–4.6)

## 2021-02-08 LAB — BASIC METABOLIC PANEL
Anion gap: 12 (ref 5–15)
BUN: 14 mg/dL (ref 6–20)
CO2: 26 mmol/L (ref 22–32)
Calcium: 9.6 mg/dL (ref 8.9–10.3)
Chloride: 101 mmol/L (ref 98–111)
Creatinine, Ser: 1.46 mg/dL — ABNORMAL HIGH (ref 0.61–1.24)
GFR, Estimated: >60 mL/min (ref >60)
Glucose, Bld: 112 mg/dL — ABNORMAL HIGH (ref 70–99)
Potassium: 4 mmol/L (ref 3.5–5.1)
Sodium: 139 mmol/L (ref 135–145)

## 2021-02-08 NOTE — Consult Note (Signed)
[facility] Nurse Consult Note: Reason for Consult: WOC nurse is consulted for toenails in need of debridement; they are causing traumatic injuries to patient's feet.   Unfortunately, toenail debridement is outside the scope of [facility] practice.    I have communicated my recommendation that Podiatric Medicine be consulted for this intervention to Dr. Tana Coast via [place].   Thank you, Maudie Flakes, MSN, RN, Chancy Milroy, Arther Abbott  Pager# [phone]

## 2021-02-08 NOTE — Progress Notes (Addendum)
SLP Cancellation Note  Patient Details Name: Joshua [name] MRN: [id number] DOB: May 06, 1981   Cancelled treatment:        Patient declined, no reason specified. SLP attempted PO trials, however Pt turned his head and demonstrated facial grimacing and grunts of frustration with each attempt.  Per RN and NT, Pt has refused and seems unmotivated for all PO. ST will continue efforts,  Thank you, Monnie Gudgel H. Roddie Mc, CCC-SLP Speech Language Pathologist                                                                                              Wende Bushy 02/08/2021, 3:11 PM

## 2021-02-08 NOTE — Progress Notes (Signed)
[facility]                                                                              Patient Demographics  Joshua [name], is a 40 y.o. male, DOB - Jan 05, 1981, [id number]  Admit date - 02/04/2021   Admitting Physician Eben Burow, MD  Outpatient Primary MD for the patient is Patient, [name]  Outpatient specialists:   LOS - 3  days   Medical records reviewed and are as summarized below:    Chief Complaint  Patient presents with  . 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  Presented by EMS for concern for seizures. Patient at baseline is nonverbal with right hemiplegia and requires full assistance with ADLs, and is being cared for at home by family.  Reportedly family was concerned today as Joshua [name] had a leftward gaze, which usually happens when he has a seizure and the gaze did not break He continued to have a leftward gaze after arrival and he was given 1 dose of Ativan followed by a load of Keppra 2 g IV x1.   Assessment & Plan    Principal Problem:   Seizure ([place]) in the setting of large left ICH status post craniotomy, recurrent seizures - Presented with left gaze deviation, has resolved with Ativan and Keppra  -Neurology was consulted, likely breakthrough seizure given large area of encephalomalacia in the left hemisphere - Chest x-ray showed mild hazy airspace opacity in the right mid and lower lung could be due to atelectasis and/or infectious etiology.  No respiratory symptoms -COVID-19 test negative.  Procalcitonin less than 0.1, no systemic signs of infection, hold off on antibiotics. -No repeat  seizures, will continue Keppra 1 g twice daily.  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, history of large left ICH status post craniotomy -Alert and awake, nonverbal at baseline    Essential hypertension, has history of PRES -BP improving, continue amlodipine, metoprolol, hydralazine 25 mg 3 times daily  -Continue IV hydralazine as needed with parameters  CKD stage IIIa -Creatinine continues to be at baseline 1.3-1.4  Nutrition/PEG tube, dysphagia, moderate protein calorie malnutrition -SLP evaluation done, placed on dysphagia 2 diet with thin liquids, per mother eats normal food -Not eating, continue tube feeds, free water, dietitian following    Hypokalemia -Resolved  Code Status: Full CODE STATUS DVT Prophylaxis:  SCDs Start: 02/05/21 0213   Level of Care: Level of care: Telemetry Medical Family Communication: Called patient's mother, Mrs [name], on phone number [phone] and updated on 2/9 and 2/10.  Patient's mother stated that patient currently lives with her sister and it has been difficult taking care of him.  She is requesting skilled nursing facility.  She also wants him  placed in [place], requested patient's mother to speak with SW regarding the logistics. Patient's mother updated on 2/12 on phone    Disposition Plan:     Status is: Inpatient  Remains inpatient appropriate because:Inpatient level of care appropriate due to severity of illness   Dispo: The patient is from: Home              Anticipated d/c is to: tbd              Anticipated d/c date is: 2 days              Patient currently is not medically stable to d/c.  Awaiting skilled nursing facility   Difficult to place patient No      Time Spent in minutes   35 mins  Procedures:  none  Consultants:   Neurology   Antimicrobials:   Anti-infectives (From admission, onward)   None          Medications  Scheduled Meds: . amLODipine  10 mg Per Tube q AM  . feeding supplement (PROSource TF)  45 mL Per Tube Daily  . free water  200 mL Per Tube Q4H  . hydrALAZINE  25 mg Per Tube Q8H  . metoprolol tartrate  50 mg Per Tube BID   Continuous Infusions: . feeding supplement (OSMOLITE 1.5 CAL) 50 mL/hr at 02/07/21 2200  . lactated ringers 75 mL/hr at 02/07/21 0445  . levETIRAcetam 1,000 mg (02/08/21 0942)   PRN Meds:.      Subjective:   Joshua [name] was seen and examined today.  Much more alert and awake, nonverbal at baseline.  However was able to blink when cooperating and lifted up the left arm to stop/indicating displeasure.  BP improving.  No repeat seizures.  No pain, on tube feeds.  No fevers or chills.  Patient examined with RN in the room.  Objective:   Vitals:   02/08/21 0430 02/08/21 0809 02/08/21 0811 02/08/21 1249  BP:  (!) 138/102  (!) 133/100  Pulse:  97  73  Resp:  18  18  Temp:   (!) 97.3 F (36.3 C) 98.7 F (37.1 C)  TempSrc:   Oral Oral  SpO2:  99%  100%  Weight: 42.6 kg     Height:        Intake/Output Summary (Last 24 hours) at 02/08/2021 1301 Last data filed at 02/07/2021 2200 Gross per 24 hour  Intake 4054.33 ml  Output 300 ml  Net 3754.33 ml     Wt Readings from Last 3 Encounters:  02/08/21 42.6 kg  10/04/20 65.8 kg  09/22/20 61 kg   Physical Exam  General: Alert and awake, NAD  Cardiovascular: S1 S2 clear, RRR. No pedal edema b/l  Respiratory: CTA B  Gastrointestinal: Soft, nontender, nondistended, NBS  Ext: no pedal edema bilaterally  Neuro: left arm has control, right upper extremity contractures, both lower extremities spontaneously moves, appears to have some contractures  Musculoskeletal: No cyanosis, clubbing  Skin: No rashes  Psych: alert and awake, nonverbal    Data Reviewed:  I have personally reviewed following labs and imaging studies  Micro Results Recent Results (from the past 240  hour(s))  Resp Panel by RT-PCR (Flu A&B, Covid) Nasopharyngeal Swab     Status: None   Collection Time: 02/05/21 12:57 AM   Specimen: Nasopharyngeal Swab; Nasopharyngeal(NP) swabs in vial transport medium  Result Value Ref Range Status   SARS Coronavirus 2 by RT PCR NEGATIVE NEGATIVE Final  Comment: (NOTE) SARS-CoV-2 target nucleic acids are NOT DETECTED.  The SARS-CoV-2 RNA is generally detectable in upper respiratory specimens during the acute phase of infection. The lowest concentration of SARS-CoV-2 viral copies this assay can detect is 138 copies/mL. A negative result does not preclude SARS-Cov-2 infection and should not be used as the sole basis for treatment or other patient management decisions. A negative result may occur with  improper specimen collection/handling, submission of specimen other than nasopharyngeal swab, presence of viral mutation(s) within the areas targeted by this assay, and inadequate number of viral copies(<138 copies/mL). A negative result must be combined with clinical observations, patient history, and epidemiological information. The expected result is Negative.  Fact Sheet for Patients:  EntrepreneurPulse.com.au  Fact Sheet for Healthcare Providers:  IncredibleEmployment.be  This test is no t yet approved or cleared by the [place] FDA and  has been authorized for detection and/or diagnosis of SARS-CoV-2 by FDA under an Emergency Use Authorization (EUA). This EUA will remain  in effect (meaning this test can be used) for the duration of the COVID-19 declaration under Section 564(b)(1) of the Act, 21 U.S.C.section 360bbb-3(b)(1), unless the authorization is terminated  or revoked sooner.       Influenza A by PCR NEGATIVE NEGATIVE Final   Influenza B by PCR NEGATIVE NEGATIVE Final    Comment: (NOTE) The Xpert Xpress SARS-CoV-2/FLU/RSV plus assay is intended as an aid in the diagnosis of influenza from  Nasopharyngeal swab specimens and should not be used as a sole basis for treatment. Nasal washings and aspirates are unacceptable for Xpert Xpress SARS-CoV-2/FLU/RSV testing.  Fact Sheet for Patients: EntrepreneurPulse.com.au  Fact Sheet for Healthcare Providers: IncredibleEmployment.be  This test is not yet approved or cleared by the [place] FDA and has been authorized for detection and/or diagnosis of SARS-CoV-2 by FDA under an Emergency Use Authorization (EUA). This EUA will remain in effect (meaning this test can be used) for the duration of the COVID-19 declaration under Section 564(b)(1) of the Act, 21 U.S.C. section 360bbb-3(b)(1), unless the authorization is terminated or revoked.  Performed at [facility], [place] [address], [place], [place] [address]     Radiology Reports CT Head Wo Contrast  Result Date: 02/04/2021 CLINICAL DATA:  Seizure EXAM: CT HEAD WITHOUT CONTRAST TECHNIQUE: Contiguous axial images were obtained from the base of the skull through the vertex without intravenous contrast. COMPARISON:  09/22/2020 FINDINGS: Brain: Large area of encephalomalacia in the left MCA territory related to prior infarct. There is volume loss with ex vacuo dilatation of the lateral ventricles. No acute hemorrhage or extra-axial collection. Vascular: No abnormal hyperdensity of the major intracranial arteries or dural venous sinuses. No intracranial atherosclerosis. Skull: Remote left pterional craniectomy. Sinuses/Orbits: No fluid levels or advanced mucosal thickening of the visualized paranasal sinuses. No mastoid or middle ear effusion. The orbits are normal. IMPRESSION: 1. No acute intracranial abnormality. 2. 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 Electronically Signed   By: Ulyses Jarred M.D.   On: 02/04/2021 23:56   DG Chest Portable 1 View  Result Date: 02/05/2021 CLINICAL DATA:  Seizure  EXAM: PORTABLE CHEST 1 VIEW COMPARISON:  None. FINDINGS: The heart size and mediastinal contours are within normal limits. Mildly hazy airspace opacity seen within the right mid and lower lung. No large airspace consolidation or pleural effusion. The visualized skeletal structures are unremarkable. IMPRESSION: Mild hazy airspace opacity within the right mid and lower lung which could be  due to atelectasis and/or infectious etiology. Electronically Signed   By: Prudencio Pair M.D.   On: 02/05/2021 00:00    Lab Data:  CBC: Recent Labs  Lab 02/04/21 2244 02/05/21 0333 02/06/21 0345 02/07/21 0249 02/08/21 0555  WBC 7.0 5.7 7.2 8.5 7.4  NEUTROABS 3.7  --   --   --   --   HGB 14.6 14.1 13.5 13.6 14.0  HCT 47.5 45.7 41.5 41.0 41.3  MCV 82.8 83.9 81.1 80.1 78.8*  PLT 270 279 260 271 [id number]   Basic Metabolic Panel: Recent Labs  Lab 02/04/21 2244 02/05/21 0333 02/06/21 0345 02/07/21 0249 02/07/21 1659 02/08/21 0555  NA 140 140 137 136  --  139  K 3.7 3.8 3.3* 3.5  --  4.0  CL 104 106 100 100  --  101  CO2 '25 25 27 24  '$ --  26  GLUCOSE 111* 114* 112* 88  --  112*  BUN '17 17 11 9  '$ --  14  CREATININE 1.36* 1.32* 1.39* 1.44*  --  1.46*  CALCIUM 9.3 9.7 9.5 9.7  --  9.6  MG  --   --   --   --  1.7 1.9  PHOS  --   --   --   --  3.5 3.8   GFR: Estimated Creatinine Clearance: 40.1 mL/min (A) (by C-G formula based on SCr of 1.46 mg/dL (H)). Liver Function Tests: No results for input(s): AST, ALT, ALKPHOS, BILITOT, PROT, ALBUMIN in the last 168 hours. No results for input(s): LIPASE, AMYLASE in the last 168 hours. No results for input(s): AMMONIA in the last 168 hours. Coagulation Profile: No results for input(s): INR, PROTIME in the last 168 hours. Cardiac Enzymes: No results for input(s): CKTOTAL, CKMB, CKMBINDEX, TROPONINI in the last 168 hours. BNP (last 3 results) No results for input(s): PROBNP in the last 8760 hours. HbA1C: No results for input(s): HGBA1C in the last 72  hours. CBG: Recent Labs  Lab 02/04/21 2320 02/07/21 2049 02/07/21 2334 02/08/21 0401 02/08/21 0823  GLUCAP 112* 118* 122* 129* 135*   Lipid Profile: No results for input(s): CHOL, HDL, LDLCALC, TRIG, CHOLHDL, LDLDIRECT in the last 72 hours. Thyroid Function Tests: No results for input(s): TSH, T4TOTAL, FREET4, T3FREE, THYROIDAB in the last 72 hours. Anemia Panel: No results for input(s): VITAMINB12, FOLATE, FERRITIN, TIBC, IRON, RETICCTPCT in the last 72 hours. Urine analysis:    Component Value Date/Time   COLORURINE YELLOW 10/04/2020 1210   APPEARANCEUR HAZY (A) 10/04/2020 1210   LABSPEC 1.012 10/04/2020 1210   PHURINE 8.0 10/04/2020 1210   GLUCOSEU NEGATIVE 10/04/2020 1210   HGBUR NEGATIVE 10/04/2020 1210   BILIRUBINUR NEGATIVE 10/04/2020 1210   KETONESUR NEGATIVE 10/04/2020 1210   PROTEINUR NEGATIVE 10/04/2020 1210   UROBILINOGEN 1.0 04/05/2009 0021   NITRITE NEGATIVE 10/04/2020 1210   LEUKOCYTESUR TRACE (A) 10/04/2020 1210     Ripudeep Rai M.D. [facility] 02/08/2021, 1:01 PM   Call night coverage person covering after 7pm

## 2021-02-09 DIAGNOSIS — I10 Essential (primary) hypertension: Secondary | ICD-10-CM | POA: Diagnosis not present

## 2021-02-09 DIAGNOSIS — E43 Unspecified severe protein-calorie malnutrition: Secondary | ICD-10-CM | POA: Diagnosis not present

## 2021-02-09 DIAGNOSIS — G939 Disorder of brain, unspecified: Secondary | ICD-10-CM | POA: Diagnosis not present

## 2021-02-09 DIAGNOSIS — R4701 Aphasia: Secondary | ICD-10-CM | POA: Diagnosis not present

## 2021-02-09 LAB — GLUCOSE, CAPILLARY
Glucose-Capillary: 111 mg/dL — ABNORMAL HIGH (ref 70–99)
Glucose-Capillary: 118 mg/dL — ABNORMAL HIGH (ref 70–99)
Glucose-Capillary: 128 mg/dL — ABNORMAL HIGH (ref 70–99)
Glucose-Capillary: 138 mg/dL — ABNORMAL HIGH (ref 70–99)
Glucose-Capillary: 141 mg/dL — ABNORMAL HIGH (ref 70–99)
Glucose-Capillary: 142 mg/dL — ABNORMAL HIGH (ref 70–99)

## 2021-02-09 LAB — MAGNESIUM: Magnesium: 1.9 mg/dL (ref 1.7–2.4)

## 2021-02-09 LAB — PHOSPHORUS: Phosphorus: 4 mg/dL (ref 2.5–4.6)

## 2021-02-09 MED ORDER — LEVETIRACETAM 100 MG/ML PO SOLN
1000.0000 mg | Freq: Two times a day (BID) | ORAL | Status: DC
  Administered 2021-02-09 – 2021-03-07 (×53): 1000 mg
  Filled 2021-02-09 (×53): qty 10

## 2021-02-09 NOTE — Progress Notes (Signed)
[facility]                                                                              Patient Demographics  Joshua [name], is a 40 y.o. male, DOB - [birth year], [id number]  Admit date - 02/04/2021   Admitting Physician Eben Burow, MD  Outpatient Primary MD for the patient is Patient, [name]  Outpatient specialists:   LOS - 4  days   Medical records reviewed and are as summarized below:    Chief Complaint  Patient presents with  . 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  Presented by EMS for concern for seizures. Patient at baseline is nonverbal with right hemiplegia and requires full assistance with ADLs, and is being cared for at home by family.  Reportedly family was concerned today as Mr. Galante had a leftward gaze, which usually happens when he has a seizure and the gaze did not break He continued to have a leftward gaze after arrival and he was given 1 dose of Ativan followed by a load of Keppra 2 g IV x1.   Assessment & Plan    Principal Problem:   Seizure ([place]) in the setting of large left ICH status post craniotomy, recurrent seizures - Presented with left gaze deviation, has resolved with Ativan and Keppra  -Neurology was consulted, likely breakthrough seizure given large area of encephalomalacia in the left hemisphere - Chest x-ray showed mild hazy airspace opacity in the right mid and lower lung could be due to atelectasis and/or infectious etiology.  No respiratory symptoms -COVID-19 test negative.  Procalcitonin less than 0.1, no systemic signs of infection, hold off on antibiotics. 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, history of large left ICH status post craniotomy -Alert and awake, nonverbal at baseline    Essential hypertension, has history of PRES -BP stable, continue metoprolol, clonidine, hydralazine  -Continue IV hydralazine as needed with parameters  CKD stage IIIa -Creatinine continues to be at baseline 1.3-1.4  Nutrition/PEG tube, dysphagia, moderate protein calorie malnutrition -SLP evaluation done, placed on dysphagia 2 diet with thin liquids, per mother eats normal food -Not eating, continue tube feeds, free water, dietitian following    Hypokalemia -Resolved  Code Status: Full CODE STATUS DVT Prophylaxis:  SCDs Start: 02/05/21 0213   Level of Care: Level of care: Telemetry Medical Family Communication: Called patient's mother, Mrs [name], on phone number [phone] and updated on 2/9 and 2/10.  Patient's mother stated that patient currently lives with her sister and it has been difficult taking care of him.  She is requesting skilled nursing facility.  She also wants him placed in [place], requested patient's mother to speak with SW regarding the logistics. Patient's  mother updated on 2/12 on phone    Disposition Plan:     Status is: Inpatient  Remains inpatient appropriate because:Inpatient level of care appropriate due to severity of illness   Dispo: The patient is from: Home              Anticipated d/c is to: tbd              Anticipated d/c date is: 2 days              Patient currently is not medically stable to d/c.  Awaiting skilled nursing facility   Difficult to place patient No      Time Spent in minutes   35 mins  Procedures:  none  Consultants:   Neurology   Antimicrobials:   Anti-infectives (From admission, onward)   None         Medications  Scheduled Meds: . amLODipine  10 mg Per Tube q AM  . feeding  supplement (PROSource TF)  45 mL Per Tube Daily  . free water  200 mL Per Tube Q4H  . hydrALAZINE  25 mg Per Tube Q8H  . levETIRAcetam  1,000 mg Per Tube Q12H  . metoprolol tartrate  50 mg Per Tube BID   Continuous Infusions: . feeding supplement (OSMOLITE 1.5 CAL) 1,000 mL (02/08/21 1517)   PRN Meds:.      Subjective:   [place] was seen and examined today. Alert and awake, nonverbal, no acute events overnight. No repeat seizures. No fevers or chills. On tube feeds   Objective:   Vitals:   02/08/21 1944 02/08/21 2341 02/09/21 0348 02/09/21 0815  BP: (!) 134/107 119/89 (!) 122/93 115/81  Pulse: 84 79 71 85  Resp: '18 18 18 16  '$ Temp: (!) 97.5 F (36.4 C) 98.4 F (36.9 C) 98.6 F (37 C) 98.3 F (36.8 C)  TempSrc: Oral Oral Oral Axillary  SpO2: 98% 99% 99% 99%  Weight:   41.3 kg   Height:        Intake/Output Summary (Last 24 hours) at 02/09/2021 1129 Last data filed at 02/09/2021 0418 Gross per 24 hour  Intake -  Output 1750 ml  Net -1750 ml     Wt Readings from Last 3 Encounters:  02/09/21 41.3 kg  10/04/20 65.8 kg  09/22/20 61 kg   Physical Exam  General: Nonverbal but alert and awake  Cardiovascular: S1 S2 clear, RRR. No pedal edema b/l  Respiratory: CTA B  Gastrointestinal: Soft, nontender, nondistended, NBS  Ext: no pedal edema bilaterally  Neuro: right hand contracture, both lower extremities spontaneously moves  Musculoskeletal: No cyanosis, clubbing  Skin: No rashes  Psych: nonverbal  Data Reviewed:  I have personally reviewed following labs and imaging studies  Micro Results Recent Results (from the past 240 hour(s))  Resp Panel by RT-PCR (Flu A&B, Covid) Nasopharyngeal Swab     Status: None   Collection Time: 02/05/21 12:57 AM   Specimen: Nasopharyngeal Swab; Nasopharyngeal(NP) swabs in vial transport medium  Result Value Ref Range Status   SARS Coronavirus 2 by RT PCR NEGATIVE NEGATIVE Final    Comment: (NOTE) SARS-CoV-2  target nucleic acids are NOT DETECTED.  The SARS-CoV-2 RNA is generally detectable in upper respiratory specimens during the acute phase of infection. The lowest concentration of SARS-CoV-2 viral copies this assay can detect is 138 copies/mL. A negative result does not preclude SARS-Cov-2 infection and should not be used as the sole basis for treatment or other patient  management decisions. A negative result may occur with  improper specimen collection/handling, submission of specimen other than nasopharyngeal swab, presence of viral mutation(s) within the areas targeted by this assay, and inadequate number of viral copies(<138 copies/mL). A negative result must be combined with clinical observations, patient history, and epidemiological information. The expected result is Negative.  Fact Sheet for Patients:  EntrepreneurPulse.com.au  Fact Sheet for Healthcare Providers:  IncredibleEmployment.be  This test is no t yet approved or cleared by the [place] FDA and  has been authorized for detection and/or diagnosis of SARS-CoV-2 by FDA under an Emergency Use Authorization (EUA). This EUA will remain  in effect (meaning this test can be used) for the duration of the COVID-19 declaration under Section 564(b)(1) of the Act, 21 U.S.C.section 360bbb-3(b)(1), unless the authorization is terminated  or revoked sooner.       Influenza A by PCR NEGATIVE NEGATIVE Final   Influenza B by PCR NEGATIVE NEGATIVE Final    Comment: (NOTE) The Xpert Xpress SARS-CoV-2/FLU/RSV plus assay is intended as an aid in the diagnosis of influenza from Nasopharyngeal swab specimens and should not be used as a sole basis for treatment. Nasal washings and aspirates are unacceptable for Xpert Xpress SARS-CoV-2/FLU/RSV testing.  Fact Sheet for Patients: EntrepreneurPulse.com.au  Fact Sheet for Healthcare  Providers: IncredibleEmployment.be  This test is not yet approved or cleared by the [place] FDA and has been authorized for detection and/or diagnosis of SARS-CoV-2 by FDA under an Emergency Use Authorization (EUA). This EUA will remain in effect (meaning this test can be used) for the duration of the COVID-19 declaration under Section 564(b)(1) of the Act, 21 U.S.C. section 360bbb-3(b)(1), unless the authorization is terminated or revoked.  Performed at [facility], [place] [address], [place], [place] [address]     Radiology Reports CT Head Wo Contrast  Result Date: 02/04/2021 CLINICAL DATA:  Seizure EXAM: CT HEAD WITHOUT CONTRAST TECHNIQUE: Contiguous axial images were obtained from the base of the skull through the vertex without intravenous contrast. COMPARISON:  09/22/2020 FINDINGS: Brain: Large area of encephalomalacia in the left MCA territory related to prior infarct. There is volume loss with ex vacuo dilatation of the lateral ventricles. No acute hemorrhage or extra-axial collection. Vascular: No abnormal hyperdensity of the major intracranial arteries or dural venous sinuses. No intracranial atherosclerosis. Skull: Remote left pterional craniectomy. Sinuses/Orbits: No fluid levels or advanced mucosal thickening of the visualized paranasal sinuses. No mastoid or middle ear effusion. The orbits are normal. IMPRESSION: 1. No acute intracranial abnormality. 2. 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 Electronically Signed   By: Ulyses Jarred M.D.   On: 02/04/2021 23:56   DG Chest Portable 1 View  Result Date: 02/05/2021 CLINICAL DATA:  Seizure EXAM: PORTABLE CHEST 1 VIEW COMPARISON:  None. FINDINGS: The heart size and mediastinal contours are within normal limits. Mildly hazy airspace opacity seen within the right mid and lower lung. No large airspace consolidation or pleural effusion. The visualized skeletal structures  are unremarkable. IMPRESSION: Mild hazy airspace opacity within the right mid and lower lung which could be due to atelectasis and/or infectious etiology. Electronically Signed   By: Prudencio Pair M.D.   On: 02/05/2021 00:00    Lab Data:  CBC: Recent Labs  Lab 02/04/21 2244 02/05/21 0333 02/06/21 0345 02/07/21 0249 02/08/21 0555  WBC 7.0 5.7 7.2 8.5 7.4  NEUTROABS 3.7  --   --   --   --  HGB 14.6 14.1 13.5 13.6 14.0  HCT 47.5 45.7 41.5 41.0 41.3  MCV 82.8 83.9 81.1 80.1 78.8*  PLT 270 279 260 271 [id number]   Basic Metabolic Panel: Recent Labs  Lab 02/04/21 2244 02/05/21 0333 02/06/21 0345 02/07/21 0249 02/07/21 1659 02/08/21 0555 02/08/21 1912 02/09/21 0447  NA 140 140 137 136  --  139  --   --   K 3.7 3.8 3.3* 3.5  --  4.0  --   --   CL 104 106 100 100  --  101  --   --   CO2 '25 25 27 24  '$ --  26  --   --   GLUCOSE 111* 114* 112* 88  --  112*  --   --   BUN '17 17 11 9  '$ --  14  --   --   CREATININE 1.36* 1.32* 1.39* 1.44*  --  1.46*  --   --   CALCIUM 9.3 9.7 9.5 9.7  --  9.6  --   --   MG  --   --   --   --  1.7 1.9 1.8 1.9  PHOS  --   --   --   --  3.5 3.8 3.4 4.0   GFR: Estimated Creatinine Clearance: 38.9 mL/min (A) (by C-G formula based on SCr of 1.46 mg/dL (H)). Liver Function Tests: No results for input(s): AST, ALT, ALKPHOS, BILITOT, PROT, ALBUMIN in the last 168 hours. No results for input(s): LIPASE, AMYLASE in the last 168 hours. No results for input(s): AMMONIA in the last 168 hours. Coagulation Profile: No results for input(s): INR, PROTIME in the last 168 hours. Cardiac Enzymes: No results for input(s): CKTOTAL, CKMB, CKMBINDEX, TROPONINI in the last 168 hours. BNP (last 3 results) No results for input(s): PROBNP in the last 8760 hours. HbA1C: No results for input(s): HGBA1C in the last 72 hours. CBG: Recent Labs  Lab 02/08/21 1734 02/08/21 1942 02/08/21 2340 02/09/21 0347 02/09/21 0742  GLUCAP 147* 127* 125* 141* 111*   Lipid Profile: No  results for input(s): CHOL, HDL, LDLCALC, TRIG, CHOLHDL, LDLDIRECT in the last 72 hours. Thyroid Function Tests: No results for input(s): TSH, T4TOTAL, FREET4, T3FREE, THYROIDAB in the last 72 hours. Anemia Panel: No results for input(s): VITAMINB12, FOLATE, FERRITIN, TIBC, IRON, RETICCTPCT in the last 72 hours. Urine analysis:    Component Value Date/Time   COLORURINE YELLOW 10/04/2020 1210   APPEARANCEUR HAZY (A) 10/04/2020 1210   LABSPEC 1.012 10/04/2020 1210   PHURINE 8.0 10/04/2020 1210   GLUCOSEU NEGATIVE 10/04/2020 1210   HGBUR NEGATIVE 10/04/2020 1210   BILIRUBINUR NEGATIVE 10/04/2020 1210   KETONESUR NEGATIVE 10/04/2020 1210   PROTEINUR NEGATIVE 10/04/2020 1210   UROBILINOGEN 1.0 04/05/2009 0021   NITRITE NEGATIVE 10/04/2020 1210   LEUKOCYTESUR TRACE (A) 10/04/2020 1210     Ripudeep Rai M.D. [facility] 02/09/2021, 11:29 AM   Call night coverage person covering after 7pm

## 2021-02-10 DIAGNOSIS — I10 Essential (primary) hypertension: Secondary | ICD-10-CM | POA: Diagnosis not present

## 2021-02-10 DIAGNOSIS — G939 Disorder of brain, unspecified: Secondary | ICD-10-CM | POA: Diagnosis not present

## 2021-02-10 DIAGNOSIS — R4701 Aphasia: Secondary | ICD-10-CM | POA: Diagnosis not present

## 2021-02-10 LAB — COMPREHENSIVE METABOLIC PANEL
ALT: 11 U/L (ref 0–44)
AST: 10 U/L — ABNORMAL LOW (ref 15–41)
Albumin: 3.3 g/dL — ABNORMAL LOW (ref 3.5–5.0)
Alkaline Phosphatase: 67 U/L (ref 38–126)
Anion gap: 11 (ref 5–15)
BUN: 16 mg/dL (ref 6–20)
CO2: 25 mmol/L (ref 22–32)
Calcium: 9.4 mg/dL (ref 8.9–10.3)
Chloride: 103 mmol/L (ref 98–111)
Creatinine, Ser: 1.32 mg/dL — ABNORMAL HIGH (ref 0.61–1.24)
GFR, Estimated: >60 mL/min (ref >60)
Glucose, Bld: 123 mg/dL — ABNORMAL HIGH (ref 70–99)
Potassium: 4 mmol/L (ref 3.5–5.1)
Sodium: 139 mmol/L (ref 135–145)
Total Bilirubin: 0.7 mg/dL (ref 0.3–1.2)
Total Protein: 6.9 g/dL (ref 6.5–8.1)

## 2021-02-10 LAB — CBC
HCT: 43.3 % (ref 39.0–52.0)
Hemoglobin: 13.5 g/dL (ref 13.0–17.0)
MCH: 25.3 pg — ABNORMAL LOW (ref 26.0–34.0)
MCHC: 31.2 g/dL (ref 30.0–36.0)
MCV: 81.2 fL (ref 80.0–100.0)
Platelets: 269 10*3/uL (ref 150–400)
RBC: 5.33 MIL/uL (ref 4.22–5.81)
RDW: 15.3 % (ref 11.5–15.5)
WBC: 5.5 10*3/uL (ref 4.0–10.5)
nRBC: 0 % (ref 0.0–0.2)

## 2021-02-10 LAB — GLUCOSE, CAPILLARY
Glucose-Capillary: 102 mg/dL — ABNORMAL HIGH (ref 70–99)
Glucose-Capillary: 109 mg/dL — ABNORMAL HIGH (ref 70–99)
Glucose-Capillary: 123 mg/dL — ABNORMAL HIGH (ref 70–99)
Glucose-Capillary: 120 mg/dL — ABNORMAL HIGH (ref 70–99)
Glucose-Capillary: 97 mg/dL (ref 70–99)

## 2021-02-10 NOTE — Progress Notes (Signed)
[facility]                                                                              Patient Demographics  Joshua [name], is a 40 y.o. male, DOB - May 27, 1981, [id number]  Admit date - 02/04/2021   Admitting Physician Eben Burow, MD  Outpatient Primary MD for the patient is Patient, [name]  Outpatient specialists:   LOS - 5  days   Medical records reviewed and are as summarized below:    Chief Complaint  Patient presents with   Possible seizure       Brief summary   Patient is a 40 year old male with history of large [place] in September 2020 requiring hemicraniectomy and evacuation followed by an extended hospital stay, eventually discharged home in May 2021, followed by development of seizures and multiple ER visits and hospital admissions for concern for seizures/seizure-like activity/concern for status.  Presented by EMS for concern for seizures. Patient at baseline is nonverbal with right hemiplegia and requires full assistance with ADLs, and is being cared for at home by family.  Reportedly family was concerned today as Mr. Hopman had a leftward gaze, which usually happens when he has a seizure and the gaze did not break He continued to have a leftward gaze after arrival and he was given 1 dose of Ativan followed by a load of Keppra 2 g IV x1.   Assessment & Plan    Principal Problem:   Seizure ([place]) in the setting of large left ICH status post craniotomy, recurrent seizures - Presented with left gaze deviation, has resolved with Ativan and Keppra  -Neurology was consulted, likely breakthrough seizure given large area of encephalomalacia in the left hemisphere - Chest x-ray showed mild hazy airspace opacity in the right mid and lower lung could be due to atelectasis and/or infectious etiology.  No respiratory symptoms -COVID-19 test negative.  Procalcitonin less than 0.1, no systemic signs of infection, hold off on antibiotics. 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   -Continue IV hydralazine as needed with parameters  CKD stage IIIa -Baseline 1.3-1.4. -Creatinine currently at baseline  Nutrition/PEG tube, dysphagia, moderate protein calorie malnutrition -SLP evaluation done, placed on dysphagia 2 diet with thin liquids, per mother eats normal food -Not eating, continue tube feeds, free water, dietitian following    Hypokalemia -Resolved  Code Status: Full CODE STATUS DVT Prophylaxis:  SCDs Start: 02/05/21 0213   Level of Care: Level of care: Telemetry Medical Family Communication: Called patient's mother, Mrs [name], on phone number [phone] and updated on 2/9 and 2/10.  Patient's mother stated that patient currently lives with her sister and it has been difficult taking care of him.  She is requesting skilled nursing facility.  She also wants him placed in [place], requested patient's mother to speak with SW regarding the logistics. Patient's mother updated on 2/12  on phone    Disposition Plan:     Status is: Inpatient  Remains inpatient appropriate because:Inpatient level of care appropriate due to severity of illness   Dispo: The patient is from: Home              Anticipated d/c is to: tbd              Anticipated d/c date is: 2 days              Patient currently is medically stable to d/c.  Awaiting skilled nursing facility   Difficult to place patient No      Time Spent in minutes   35 mins  Procedures:  none  Consultants:   Neurology   Antimicrobials:   Anti-infectives (From admission, onward)   None         Medications  Scheduled Meds:  amLODipine  10 mg Per Tube q AM   feeding supplement (PROSource TF)  45  mL Per Tube Daily   free water  200 mL Per Tube Q4H   hydrALAZINE  25 mg Per Tube Q8H   levETIRAcetam  1,000 mg Per Tube Q12H   metoprolol tartrate  50 mg Per Tube BID   Continuous Infusions:  feeding supplement (OSMOLITE 1.5 CAL) 1,000 mL (02/10/21 0412)   PRN Meds:.      Subjective:   Ashot Carignan was seen and examined today.  Alert and awake, nonverbal.  No fever chills or repeat seizures.  No acute events overnight.  On tube feeds, awaiting SNF   Objective:   Vitals:   02/10/21 0420 02/10/21 0808 02/10/21 1155 02/10/21 1229  BP:  (!) 134/104 (!) 132/109 130/90  Pulse:  78 72   Resp:  18 19   Temp:  98.1 F (36.7 C)  98.2 F (36.8 C)  TempSrc:  Axillary  Axillary  SpO2:  100%    Weight: 42.2 kg     Height:        Intake/Output Summary (Last 24 hours) at 02/10/2021 1318 Last data filed at 02/10/2021 0412 Gross per 24 hour  Intake --  Output 1000 ml  Net -1000 ml     Wt Readings from Last 3 Encounters:  02/10/21 42.2 kg  10/04/20 65.8 kg  09/22/20 61 kg    Physical Exam  General: Alert and awake, currently appears at baseline, NAD, nonverbal  Cardiovascular: S1 S2 clear, RRR. No pedal edema b/l  Respiratory: CTAB, no wheezing, rales or rhonchi  Gastrointestinal: Soft, nontender, nondistended, NBS  Ext: left hand mittens, right UE contractures, spontaneously moving legs  Neuro: no new deficits  Musculoskeletal: No cyanosis, clubbing  Skin: No rashes  Psych: nonverbal  Data Reviewed:  I have personally reviewed following labs and imaging studies  Micro Results Recent Results (from the past 240 hour(s))  Resp Panel by RT-PCR (Flu A&B, Covid) Nasopharyngeal Swab     Status: None   Collection Time: 02/05/21 12:57 AM   Specimen: Nasopharyngeal Swab; Nasopharyngeal(NP) swabs in vial transport medium  Result Value Ref Range Status   SARS Coronavirus 2 by RT PCR NEGATIVE NEGATIVE Final    Comment: (NOTE) SARS-CoV-2 target nucleic acids are  NOT DETECTED.  The SARS-CoV-2 RNA is generally detectable in upper respiratory specimens during the acute phase of infection. The lowest concentration of SARS-CoV-2 viral copies this assay can detect is 138 copies/mL. A negative result does not preclude SARS-Cov-2 infection and should not be used as the sole basis for treatment or  other patient management decisions. A negative result may occur with  improper specimen collection/handling, submission of specimen other than nasopharyngeal swab, presence of viral mutation(s) within the areas targeted by this assay, and inadequate number of viral copies(<138 copies/mL). A negative result must be combined with clinical observations, patient history, and epidemiological information. The expected result is Negative.  Fact Sheet for Patients:  EntrepreneurPulse.com.au  Fact Sheet for Healthcare Providers:  IncredibleEmployment.be  This test is no t yet approved or cleared by the [place] FDA and  has been authorized for detection and/or diagnosis of SARS-CoV-2 by FDA under an Emergency Use Authorization (EUA). This EUA will remain  in effect (meaning this test can be used) for the duration of the COVID-19 declaration under Section 564(b)(1) of the Act, 21 U.S.C.section 360bbb-3(b)(1), unless the authorization is terminated  or revoked sooner.       Influenza A by PCR NEGATIVE NEGATIVE Final   Influenza B by PCR NEGATIVE NEGATIVE Final    Comment: (NOTE) The Xpert Xpress SARS-CoV-2/FLU/RSV plus assay is intended as an aid in the diagnosis of influenza from Nasopharyngeal swab specimens and should not be used as a sole basis for treatment. Nasal washings and aspirates are unacceptable for Xpert Xpress SARS-CoV-2/FLU/RSV testing.  Fact Sheet for Patients: EntrepreneurPulse.com.au  Fact Sheet for Healthcare Providers: IncredibleEmployment.be  This test is not  yet approved or cleared by the [place] FDA and has been authorized for detection and/or diagnosis of SARS-CoV-2 by FDA under an Emergency Use Authorization (EUA). This EUA will remain in effect (meaning this test can be used) for the duration of the COVID-19 declaration under Section 564(b)(1) of the Act, 21 U.S.C. section 360bbb-3(b)(1), unless the authorization is terminated or revoked.  Performed at [facility], [place] [address], [place], [place] [address]     Radiology Reports CT Head Wo Contrast  Result Date: 02/04/2021 CLINICAL DATA:  Seizure EXAM: CT HEAD WITHOUT CONTRAST TECHNIQUE: Contiguous axial images were obtained from the base of the skull through the vertex without intravenous contrast. COMPARISON:  09/22/2020 FINDINGS: Brain: Large area of encephalomalacia in the left MCA territory related to prior infarct. There is volume loss with ex vacuo dilatation of the lateral ventricles. No acute hemorrhage or extra-axial collection. Vascular: No abnormal hyperdensity of the major intracranial arteries or dural venous sinuses. No intracranial atherosclerosis. Skull: Remote left pterional craniectomy. Sinuses/Orbits: No fluid levels or advanced mucosal thickening of the visualized paranasal sinuses. No mastoid or middle ear effusion. The orbits are normal. IMPRESSION: 1. No acute intracranial abnormality. 2. 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 Electronically Signed   By: Ulyses Jarred M.D.   On: 02/04/2021 23:56   DG Chest Portable 1 View  Result Date: 02/05/2021 CLINICAL DATA:  Seizure EXAM: PORTABLE CHEST 1 VIEW COMPARISON:  None. FINDINGS: The heart size and mediastinal contours are within normal limits. Mildly hazy airspace opacity seen within the right mid and lower lung. No large airspace consolidation or pleural effusion. The visualized skeletal structures are unremarkable. IMPRESSION: Mild hazy airspace opacity within the right  mid and lower lung which could be due to atelectasis and/or infectious etiology. Electronically Signed   By: Prudencio Pair M.D.   On: 02/05/2021 00:00    Lab Data:  CBC: Recent Labs  Lab 02/04/21 2244 02/05/21 0333 02/06/21 0345 02/07/21 0249 02/08/21 0555 02/10/21 0228  WBC 7.0 5.7 7.2 8.5 7.4 5.5  NEUTROABS 3.7  --   --   --   --   --  HGB 14.6 14.1 13.5 13.6 14.0 13.5  HCT 47.5 45.7 41.5 41.0 41.3 43.3  MCV 82.8 83.9 81.1 80.1 78.8* 81.2  PLT 270 279 260 271 270 [id number]   Basic Metabolic Panel: Recent Labs  Lab 02/05/21 0333 02/06/21 0345 02/07/21 0249 02/07/21 1659 02/08/21 0555 02/08/21 1912 02/09/21 0447 02/10/21 0228  NA 140 137 136  --  139  --   --  139  K 3.8 3.3* 3.5  --  4.0  --   --  4.0  CL 106 100 100  --  101  --   --  103  CO2 '25 27 24  '$ --  26  --   --  25  GLUCOSE 114* 112* 88  --  112*  --   --  123*  BUN '17 11 9  '$ --  14  --   --  16  CREATININE 1.32* 1.39* 1.44*  --  1.46*  --   --  1.32*  CALCIUM 9.7 9.5 9.7  --  9.6  --   --  9.4  MG  --   --   --  1.7 1.9 1.8 1.9  --   PHOS  --   --   --  3.5 3.8 3.4 4.0  --    GFR: Estimated Creatinine Clearance: 44 mL/min (A) (by C-G formula based on SCr of 1.32 mg/dL (H)). Liver Function Tests: Recent Labs  Lab 02/10/21 0228  AST 10*  ALT 11  ALKPHOS 67  BILITOT 0.7  PROT 6.9  ALBUMIN 3.3*   No results for input(s): LIPASE, AMYLASE in the last 168 hours. No results for input(s): AMMONIA in the last 168 hours. Coagulation Profile: No results for input(s): INR, PROTIME in the last 168 hours. Cardiac Enzymes: No results for input(s): CKTOTAL, CKMB, CKMBINDEX, TROPONINI in the last 168 hours. BNP (last 3 results) No results for input(s): PROBNP in the last 8760 hours. HbA1C: No results for input(s): HGBA1C in the last 72 hours. CBG: Recent Labs  Lab 02/09/21 1931 02/09/21 2351 02/10/21 0350 02/10/21 0800 02/10/21 1153  GLUCAP 142* 128* 102* 109* 123*   Lipid Profile: No results for  input(s): CHOL, HDL, LDLCALC, TRIG, CHOLHDL, LDLDIRECT in the last 72 hours. Thyroid Function Tests: No results for input(s): TSH, T4TOTAL, FREET4, T3FREE, THYROIDAB in the last 72 hours. Anemia Panel: No results for input(s): VITAMINB12, FOLATE, FERRITIN, TIBC, IRON, RETICCTPCT in the last 72 hours. Urine analysis:    Component Value Date/Time   COLORURINE YELLOW 10/04/2020 1210   APPEARANCEUR HAZY (A) 10/04/2020 1210   LABSPEC 1.012 10/04/2020 1210   PHURINE 8.0 10/04/2020 1210   GLUCOSEU NEGATIVE 10/04/2020 1210   HGBUR NEGATIVE 10/04/2020 1210   BILIRUBINUR NEGATIVE 10/04/2020 1210   KETONESUR NEGATIVE 10/04/2020 1210   PROTEINUR NEGATIVE 10/04/2020 1210   UROBILINOGEN 1.0 04/05/2009 0021   NITRITE NEGATIVE 10/04/2020 1210   LEUKOCYTESUR TRACE (A) 10/04/2020 1210     Corliss Coggeshall M.D. [facility] 02/10/2021, 1:18 PM   Call night coverage person covering after 7pm

## 2021-02-10 NOTE — Plan of Care (Signed)
  Problem: Nutrition: Goal: Adequate nutrition will be maintained Outcome: Progressing   Problem: Coping: Goal: Level of anxiety will decrease Outcome: Progressing   Problem: Elimination: Goal: Will not experience complications related to bowel motility Outcome: Progressing Goal: Will not experience complications related to urinary retention Outcome: Progressing   Problem: Pain Managment: Goal: General experience of comfort will improve Outcome: Progressing   

## 2021-02-10 NOTE — TOC Progression Note (Signed)
Transition of Care [facility]) - Progression Note    Patient Details  Name: Efrayim Filak MRN: [id number] Date of Birth: 20-May-1981  [facility] CM/SW [place], [place] Phone Number: 02/10/2021, 4:02 PM  Clinical Narrative:   Patient has no bed offers at this time. CSW asked [place] to review if they have any Medicaid beds. CSW also contacted facilities in [place], per family request.  [facility] [phone]) Spoke with Clarene Critchley, and they do not have any long term beds available at this time [facility] [phone]) Left a voicemail for [facility], she called back and indicated that they only take long term patients who have both Medicare and Medicaid; unable to offer for the patient at this time [facility] [phone]) Left a voicemail for [facility], awaiting call back. [place] [phone]) Spoke with Claiborne Billings in Admissions, they do not have any long term care beds available at this time [facility] [phone]) Left a voicemail for [facility], she called back later and indicated that they do have some beds available but would need to review the patient's referral. CSW faxed referral to them to review, awaiting call back.    Expected Discharge Plan: Long Term Nursing Home Barriers to Discharge: No SNF bed  Expected Discharge Plan and Services Expected Discharge Plan: [place]     Post Acute Care Choice: Nursing Home Living arrangements for the past 2 months: Single Family Home                                       Social Determinants of Health (SDOH) Interventions    Readmission Risk Interventions Readmission Risk Prevention Plan 07/04/2020  Transportation Screening Complete  PCP or Specialist Appt within 5-7 Days Complete  Home Care Screening Complete  Medication Review (RN CM) Complete  Some recent data might be hidden

## 2021-02-11 DIAGNOSIS — R4701 Aphasia: Secondary | ICD-10-CM | POA: Diagnosis not present

## 2021-02-11 DIAGNOSIS — I10 Essential (primary) hypertension: Secondary | ICD-10-CM | POA: Diagnosis not present

## 2021-02-11 DIAGNOSIS — G939 Disorder of brain, unspecified: Secondary | ICD-10-CM | POA: Diagnosis not present

## 2021-02-11 LAB — GLUCOSE, CAPILLARY
Glucose-Capillary: 106 mg/dL — ABNORMAL HIGH (ref 70–99)
Glucose-Capillary: 106 mg/dL — ABNORMAL HIGH (ref 70–99)
Glucose-Capillary: 109 mg/dL — ABNORMAL HIGH (ref 70–99)
Glucose-Capillary: 110 mg/dL — ABNORMAL HIGH (ref 70–99)
Glucose-Capillary: 115 mg/dL — ABNORMAL HIGH (ref 70–99)
Glucose-Capillary: 92 mg/dL (ref 70–99)

## 2021-02-11 NOTE — Progress Notes (Addendum)
[facility]                                                                              Patient Demographics  Joshua [name], is a 40 y.o. male, DOB - [birth year], [id number]  Admit date - 02/04/2021   Admitting Physician Eben Burow, MD  Outpatient Primary MD for the patient is Patient, [name]  Outpatient specialists:   LOS - 6  days   Medical records reviewed and are as summarized below:    Chief Complaint  Patient presents with  . 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  Presented by EMS for concern for seizures. Patient at baseline is nonverbal with right hemiplegia and requires full assistance with ADLs, and is being cared for at home by family.  Reportedly family was concerned today as Joshua [name] had a leftward gaze, which usually happens when he has a seizure and the gaze did not break He continued to have a leftward gaze after arrival and he was given 1 dose of Ativan followed by a load of Keppra 2 g IV x1.  Awaiting skilled nursing facility   Assessment & Plan    Principal Problem:   Seizure ([place]) in the setting of large left ICH status post craniotomy, recurrent seizures - Presented with left gaze deviation, has resolved with Ativan and Keppra  -Neurology was consulted, likely breakthrough seizure given large area of encephalomalacia in the left hemisphere - Chest x-ray showed mild hazy airspace opacity in the right mid and lower lung could be due to atelectasis and/or infectious etiology.  No respiratory symptoms -COVID-19 test negative.  Procalcitonin less than 0.1, no systemic signs of infection,  hold off on antibiotics. 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 -Creatinine stable  Nutrition/PEG tube, dysphagia, moderate protein calorie malnutrition -SLP evaluation done, placed on dysphagia 2 diet with thin liquids, per mother eats normal food -Not eating, placed on full tube feeds and free water, dietitian following    Hypokalemia -Resolved  Code Status: Full CODE STATUS DVT Prophylaxis:  SCDs Start: 02/05/21 0213   Level of Care: Level of care: Telemetry Medical Family Communication: Updated patient's mother, Mrs [name], on phone number [phone]. Patient's mother stated that patient currently lives with her sister and it has been difficult taking care of him.  She is requesting skilled nursing facility.  She also wants him placed in [place], requested patient's mother to speak with SW regarding the logistics. Patient's mother updated on the phone today, 2/15  Disposition Plan:     Status is: Inpatient  Remains inpatient appropriate because:Inpatient level of care appropriate due to severity of illness   Dispo: The patient is from: Home              Anticipated d/c is to: tbd              Anticipated d/c date is: 2 days              Patient currently is medically stable to d/c.  Awaiting skilled nursing facility   Difficult to place patient No      Time Spent in minutes 15 minutes  Procedures:  none  Consultants:   Neurology   Antimicrobials:   Anti-infectives (From admission, onward)   None         Medications  Scheduled Meds: . amLODipine  10 mg Per Tube q AM  . feeding supplement (PROSource  TF)  45 mL Per Tube Daily  . free water  200 mL Per Tube Q4H  . hydrALAZINE  25 mg Per Tube Q8H  . levETIRAcetam  1,000 mg Per Tube Q12H  . metoprolol tartrate  50 mg Per Tube BID   Continuous Infusions: . feeding supplement (OSMOLITE 1.5 CAL) 1,000 mL (02/10/21 2217)   PRN Meds:.      Subjective:   Joshua [name] was seen and examined today.  No acute issues, no overnight events.  No repeat seizures.  No fever chills.   Objective:   Vitals:   02/11/21 1049 02/11/21 1115 02/11/21 1149 02/11/21 1200  BP: (!) 140/103 (!) 138/102 (!) 138/96   Pulse: 91  74   Resp:   (!) 24 16  Temp:   98.5 F (36.9 C)   TempSrc:   Axillary   SpO2: 100%  100%   Weight:      Height:        Intake/Output Summary (Last 24 hours) at 02/11/2021 1401 Last data filed at 02/11/2021 1159 Gross per 24 hour  Intake --  Output 720 ml  Net -720 ml     Wt Readings from Last 3 Encounters:  02/10/21 42.2 kg  10/04/20 65.8 kg  09/22/20 61 kg   Physical Exam  General: Alert, awake, nonverbal, appears at his baseline, NAD  Cardiovascular: S1 S2 clear, RRR. No pedal edema b/l  Respiratory: CTAB, no wheezing, rales or rhonchi  Gastrointestinal: Soft, nontender, nondistended, NBS, PEG tube+  Ext: no pedal edema bilaterally  Neuro: RUE contracture, spontaneously moves legs, has better control of his left arm  Musculoskeletal: No cyanosis, clubbing  Skin: No rashes  Psych: nonverbal   Data Reviewed:  I have personally reviewed following labs and imaging studies  Micro Results Recent Results (from the past 240 hour(s))  Resp Panel by RT-PCR (Flu A&B, Covid) Nasopharyngeal Swab     Status: None   Collection Time: 02/05/21 12:57 AM   Specimen: Nasopharyngeal Swab; Nasopharyngeal(NP) swabs in vial transport medium  Result Value Ref Range Status   SARS Coronavirus 2 by RT PCR NEGATIVE NEGATIVE Final    Comment: (NOTE) SARS-CoV-2 target nucleic acids are NOT DETECTED.  The SARS-CoV-2 RNA  is generally detectable in upper respiratory specimens during the acute phase of infection. The lowest concentration of SARS-CoV-2 viral copies this assay can detect is 138 copies/mL. A negative result does not preclude SARS-Cov-2 infection and should not be used as the sole basis for treatment or other patient management decisions. A negative result may occur with  improper  specimen collection/handling, submission of specimen other than nasopharyngeal swab, presence of viral mutation(s) within the areas targeted by this assay, and inadequate number of viral copies(<138 copies/mL). A negative result must be combined with clinical observations, patient history, and epidemiological information. The expected result is Negative.  Fact Sheet for Patients:  EntrepreneurPulse.com.au  Fact Sheet for Healthcare Providers:  IncredibleEmployment.be  This test is no t yet approved or cleared by the [place] FDA and  has been authorized for detection and/or diagnosis of SARS-CoV-2 by FDA under an Emergency Use Authorization (EUA). This EUA will remain  in effect (meaning this test can be used) for the duration of the COVID-19 declaration under Section 564(b)(1) of the Act, 21 U.S.C.section 360bbb-3(b)(1), unless the authorization is terminated  or revoked sooner.       Influenza A by PCR NEGATIVE NEGATIVE Final   Influenza B by PCR NEGATIVE NEGATIVE Final    Comment: (NOTE) The Xpert Xpress SARS-CoV-2/FLU/RSV plus assay is intended as an aid in the diagnosis of influenza from Nasopharyngeal swab specimens and should not be used as a sole basis for treatment. Nasal washings and aspirates are unacceptable for Xpert Xpress SARS-CoV-2/FLU/RSV testing.  Fact Sheet for Patients: EntrepreneurPulse.com.au  Fact Sheet for Healthcare Providers: IncredibleEmployment.be  This test is not yet approved or cleared by the  [place] FDA and has been authorized for detection and/or diagnosis of SARS-CoV-2 by FDA under an Emergency Use Authorization (EUA). This EUA will remain in effect (meaning this test can be used) for the duration of the COVID-19 declaration under Section 564(b)(1) of the Act, 21 U.S.C. section 360bbb-3(b)(1), unless the authorization is terminated or revoked.  Performed at [facility], [place] [address], [place], [place] [address]     Radiology Reports CT Head Wo Contrast  Result Date: 02/04/2021 CLINICAL DATA:  Seizure EXAM: CT HEAD WITHOUT CONTRAST TECHNIQUE: Contiguous axial images were obtained from the base of the skull through the vertex without intravenous contrast. COMPARISON:  09/22/2020 FINDINGS: Brain: Large area of encephalomalacia in the left MCA territory related to prior infarct. There is volume loss with ex vacuo dilatation of the lateral ventricles. No acute hemorrhage or extra-axial collection. Vascular: No abnormal hyperdensity of the major intracranial arteries or dural venous sinuses. No intracranial atherosclerosis. Skull: Remote left pterional craniectomy. Sinuses/Orbits: No fluid levels or advanced mucosal thickening of the visualized paranasal sinuses. No mastoid or middle ear effusion. The orbits are normal. IMPRESSION: 1. No acute intracranial abnormality. 2. 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 Electronically Signed   By: Ulyses Jarred M.D.   On: 02/04/2021 23:56   DG Chest Portable 1 View  Result Date: 02/05/2021 CLINICAL DATA:  Seizure EXAM: PORTABLE CHEST 1 VIEW COMPARISON:  None. FINDINGS: The heart size and mediastinal contours are within normal limits. Mildly hazy airspace opacity seen within the right mid and lower lung. No large airspace consolidation or pleural effusion. The visualized skeletal structures are unremarkable. IMPRESSION: Mild hazy airspace opacity within the right mid and lower lung which could  be due to atelectasis and/or infectious etiology. Electronically Signed   By: Prudencio Pair M.D.   On: 02/05/2021 00:00    Lab Data:  CBC: Recent Labs  Lab 02/04/21 2244 02/05/21 0333 02/06/21 0345 02/07/21 0249 02/08/21 0555 02/10/21 0228  WBC 7.0 5.7 7.2 8.5 7.4 5.5  NEUTROABS 3.7  --   --   --   --   --   HGB 14.6  14.1 13.5 13.6 14.0 13.5  HCT 47.5 45.7 41.5 41.0 41.3 43.3  MCV 82.8 83.9 81.1 80.1 78.8* 81.2  PLT 270 279 260 271 270 [id number]   Basic Metabolic Panel: Recent Labs  Lab 02/05/21 0333 02/06/21 0345 02/07/21 0249 02/07/21 1659 02/08/21 0555 02/08/21 1912 02/09/21 0447 02/10/21 0228  NA 140 137 136  --  139  --   --  139  K 3.8 3.3* 3.5  --  4.0  --   --  4.0  CL 106 100 100  --  101  --   --  103  CO2 '25 27 24  '$ --  26  --   --  25  GLUCOSE 114* 112* 88  --  112*  --   --  123*  BUN '17 11 9  '$ --  14  --   --  16  CREATININE 1.32* 1.39* 1.44*  --  1.46*  --   --  1.32*  CALCIUM 9.7 9.5 9.7  --  9.6  --   --  9.4  MG  --   --   --  1.7 1.9 1.8 1.9  --   PHOS  --   --   --  3.5 3.8 3.4 4.0  --    GFR: Estimated Creatinine Clearance: 44 mL/min (A) (by C-G formula based on SCr of 1.32 mg/dL (H)). Liver Function Tests: Recent Labs  Lab 02/10/21 0228  AST 10*  ALT 11  ALKPHOS 67  BILITOT 0.7  PROT 6.9  ALBUMIN 3.3*   No results for input(s): LIPASE, AMYLASE in the last 168 hours. No results for input(s): AMMONIA in the last 168 hours. Coagulation Profile: No results for input(s): INR, PROTIME in the last 168 hours. Cardiac Enzymes: No results for input(s): CKTOTAL, CKMB, CKMBINDEX, TROPONINI in the last 168 hours. BNP (last 3 results) No results for input(s): PROBNP in the last 8760 hours. HbA1C: No results for input(s): HGBA1C in the last 72 hours. CBG: Recent Labs  Lab 02/10/21 2116 02/11/21 0052 02/11/21 0447 02/11/21 0724 02/11/21 1143  GLUCAP 97 115* 110* 92 106*   Lipid Profile: No results for input(s): CHOL, HDL, LDLCALC, TRIG,  CHOLHDL, LDLDIRECT in the last 72 hours. Thyroid Function Tests: No results for input(s): TSH, T4TOTAL, FREET4, T3FREE, THYROIDAB in the last 72 hours. Anemia Panel: No results for input(s): VITAMINB12, FOLATE, FERRITIN, TIBC, IRON, RETICCTPCT in the last 72 hours. Urine analysis:    Component Value Date/Time   COLORURINE YELLOW 10/04/2020 1210   APPEARANCEUR HAZY (A) 10/04/2020 1210   LABSPEC 1.012 10/04/2020 1210   PHURINE 8.0 10/04/2020 1210   GLUCOSEU NEGATIVE 10/04/2020 1210   HGBUR NEGATIVE 10/04/2020 1210   BILIRUBINUR NEGATIVE 10/04/2020 1210   KETONESUR NEGATIVE 10/04/2020 1210   PROTEINUR NEGATIVE 10/04/2020 1210   UROBILINOGEN 1.0 04/05/2009 0021   NITRITE NEGATIVE 10/04/2020 1210   LEUKOCYTESUR TRACE (A) 10/04/2020 1210     Aftyn Nott M.D. [facility] 02/11/2021, 2:01 PM   Call night coverage person covering after 7pm

## 2021-02-11 NOTE — Plan of Care (Signed)
  Problem: Clinical Measurements: Goal: Will remain free from infection Outcome: Progressing Goal: Diagnostic test results will improve Outcome: Progressing Goal: Respiratory complications will improve Outcome: Progressing Goal: Cardiovascular complication will be avoided Outcome: Progressing   

## 2021-02-12 DIAGNOSIS — E43 Unspecified severe protein-calorie malnutrition: Secondary | ICD-10-CM | POA: Diagnosis not present

## 2021-02-12 DIAGNOSIS — R4701 Aphasia: Secondary | ICD-10-CM | POA: Diagnosis not present

## 2021-02-12 LAB — GLUCOSE, CAPILLARY
Glucose-Capillary: 115 mg/dL — ABNORMAL HIGH (ref 70–99)
Glucose-Capillary: 116 mg/dL — ABNORMAL HIGH (ref 70–99)
Glucose-Capillary: 117 mg/dL — ABNORMAL HIGH (ref 70–99)
Glucose-Capillary: 119 mg/dL — ABNORMAL HIGH (ref 70–99)
Glucose-Capillary: 120 mg/dL — ABNORMAL HIGH (ref 70–99)
Glucose-Capillary: 121 mg/dL — ABNORMAL HIGH (ref 70–99)
Glucose-Capillary: 95 mg/dL (ref 70–99)

## 2021-02-12 NOTE — Progress Notes (Signed)
PROGRESS NOTE    Joshua [name]   [id number]  DOB: July 20, 1981  DOA: 02/04/2021 PCP: Patient, [name]   Brief Narrative:  Joshua [name] is a 40 year old male with a history of intracranial hemorrhage in 08/2019 requiring a hemicraniectomy and evacuation was discharged home in May 2021.  The patient has subsequently had multiple seizures with multiple ER visits for them. He presented on 02/04/2021 due to family is concerned that he was having another seizure.  He had a leftward gaze which was noted in the ED as well.  Despite receiving Ativan, it did not improve and therefore he was given an IV Keppra load.   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 - in 2020  CKD 3 - Creatinine stable  Dysphagia -  Has PEG tube and is tolerating tube feeds - also on D 2 diet with thin liquids  HTN with  H/o PRES - BP stable - continue Norvasc, Hydralazine and Metoprolol  Time spent in minutes: 35 DVT prophylaxis: SCDs Start: 02/05/21 0213 Code Status: Full code Family Communication:  Level of Care: Level of care: Telemetry Medical Disposition Plan:  Status is: Inpatient  Remains inpatient appropriate because:Unsafe d/c plan   Dispo: The patient is from: Home              Anticipated d/c is to: SNF              Anticipated d/c date is: > 3 days              Patient currently is medically stable to d/c.   Difficult to place patient Yes   Consultants:   Neurology   Antimicrobials:  Anti-infectives (From admission, onward)   None       Objective: Vitals:   02/12/21 0632 02/12/21 0752 02/12/21 1231 02/12/21 1245   BP: 107/82 (!) 138/94 (!) 135/103 132/88  Pulse: 65 83 67   Resp:  18 16   Temp:   98.5 F (36.9 C)   TempSrc:      SpO2:  100% 100%   Weight:      Height:        Intake/Output Summary (Last 24 hours) at 02/12/2021 1555 Last data filed at 02/11/2021 1727 Gross per 24 hour  Intake --  Output 1000 ml  Net -1000 ml   Filed Weights   02/08/21 0430 02/09/21 0348 02/10/21 0420  Weight: 42.6 kg 41.3 kg 42.2 kg    Examination: General exam: Appears comfortable  HEENT: PERRLA, oral mucosa moist, no sclera icterus or thrush Respiratory system: Clear to auscultation. Respiratory effort normal. Cardiovascular system: S1 & S2 heard, RRR.   Gastrointestinal system: Abdomen soft, non-tender, PEG tube present nondistended. Normal bowel sounds. Central nervous system: Alert and oriented. No focal neurological deficits. Extremities: No cyanosis, clubbing or edema Skin: No rashes or ulcers Psychiatry:  Mood & affect appropriate.     Data Reviewed: I have personally reviewed following labs and imaging studies  CBC: Recent Labs  Lab 02/06/21 0345 02/07/21 0249 02/08/21 0555 02/10/21 0228  WBC 7.2 8.5 7.4 5.5  HGB 13.5 13.6 14.0 13.5  HCT 41.5 41.0 41.3 43.3  MCV 81.1 80.1 78.8* 81.2  PLT 260 271 270 Q000111Q   Basic Metabolic Panel: Recent Labs  Lab 02/06/21 0345 02/07/21 0249 02/07/21 1659 02/08/21 0555 02/08/21 1912 02/09/21 0447 02/10/21 0228  NA 137 136  --  139  --   --  139  K 3.3* 3.5  --  4.0  --   --  4.0  CL 100 100  --  101  --   --  103  CO2 27 24  --  26  --   --  25  GLUCOSE 112* 88  --  112*  --   --  123*  BUN 11 9  --  14  --   --  16  CREATININE 1.39* 1.44*  --  1.46*  --   --  1.32*  CALCIUM 9.5 9.7  --  9.6  --   --  9.4  MG  --   --  1.7 1.9 1.8 1.9  --   PHOS  --   --  3.5 3.8 3.4 4.0  --    GFR: Estimated Creatinine Clearance: 44 mL/min (A) (by C-G formula based on SCr of 1.32 mg/dL (H)). Liver Function Tests: Recent Labs  Lab 02/10/21 0228   AST 10*  ALT 11  ALKPHOS 67  BILITOT 0.7  PROT 6.9  ALBUMIN 3.3*   No results for input(s): LIPASE, AMYLASE in the last 168 hours. No results for input(s): AMMONIA in the last 168 hours. Coagulation Profile: No results for input(s): INR, PROTIME in the last 168 hours. Cardiac Enzymes: No results for input(s): CKTOTAL, CKMB, CKMBINDEX, TROPONINI in the last 168 hours. BNP (last 3 results) No results for input(s): PROBNP in the last 8760 hours. HbA1C: No results for input(s): HGBA1C in the last 72 hours. CBG: Recent Labs  Lab 02/11/21 2111 02/11/21 2351 02/12/21 0442 02/12/21 0753 02/12/21 1228  GLUCAP 106* 109* 95 116* 117*   Lipid Profile: No results for input(s): CHOL, HDL, LDLCALC, TRIG, CHOLHDL, LDLDIRECT in the last 72 hours. Thyroid Function Tests: No results for input(s): TSH, T4TOTAL, FREET4, T3FREE, THYROIDAB in the last 72 hours. Anemia Panel: No results for input(s): VITAMINB12, FOLATE, FERRITIN, TIBC, IRON, RETICCTPCT in the last 72 hours. Urine analysis:    Component Value Date/Time   COLORURINE YELLOW 10/04/2020 1210   APPEARANCEUR HAZY (A) 10/04/2020 1210   LABSPEC 1.012 10/04/2020 1210   PHURINE 8.0 10/04/2020 1210   GLUCOSEU NEGATIVE 10/04/2020 1210   HGBUR NEGATIVE 10/04/2020 1210   BILIRUBINUR NEGATIVE 10/04/2020 1210   KETONESUR NEGATIVE 10/04/2020 1210   PROTEINUR NEGATIVE 10/04/2020 1210   UROBILINOGEN 1.0 04/05/2009 0021   NITRITE NEGATIVE 10/04/2020 1210   LEUKOCYTESUR TRACE (A) 10/04/2020 1210   Sepsis Labs: '@[id number]'$ (procalcitonin:4,lacticidven:4) ) Recent Results (from the past 240 hour(s))  Resp Panel by RT-PCR (Flu A&B, Covid) Nasopharyngeal Swab     Status: None   Collection Time: 02/05/21 12:57 AM   Specimen: Nasopharyngeal Swab; Nasopharyngeal(NP) swabs in vial transport medium  Result Value Ref Range Status   SARS Coronavirus 2 by RT PCR NEGATIVE NEGATIVE Final    Comment: (NOTE) SARS-CoV-2 target nucleic acids are NOT  DETECTED.  The SARS-CoV-2 RNA is generally detectable in upper respiratory specimens during the acute phase of infection. The lowest concentration of SARS-CoV-2 viral copies this assay can detect is 138 copies/mL. A negative result does not preclude SARS-Cov-2 infection and should not be used as the sole basis for treatment or other patient management decisions. A negative result may  occur with  improper specimen collection/handling, submission of specimen other than nasopharyngeal swab, presence of viral mutation(s) within the areas targeted by this assay, and inadequate number of viral copies(<138 copies/mL). A negative result must be combined with clinical observations, patient history, and epidemiological information. The expected result is Negative.  Fact Sheet for Patients:  EntrepreneurPulse.com.au  Fact Sheet for Healthcare Providers:  IncredibleEmployment.be  This test is no t yet approved or cleared by the [place] FDA and  has been authorized for detection and/or diagnosis of SARS-CoV-2 by FDA under an Emergency Use Authorization (EUA). This EUA will remain  in effect (meaning this test can be used) for the duration of the COVID-19 declaration under Section 564(b)(1) of the Act, 21 U.S.C.section 360bbb-3(b)(1), unless the authorization is terminated  or revoked sooner.       Influenza A by PCR NEGATIVE NEGATIVE Final   Influenza B by PCR NEGATIVE NEGATIVE Final    Comment: (NOTE) The Xpert Xpress SARS-CoV-2/FLU/RSV plus assay is intended as an aid in the diagnosis of influenza from Nasopharyngeal swab specimens and should not be used as a sole basis for treatment. Nasal washings and aspirates are unacceptable for Xpert Xpress SARS-CoV-2/FLU/RSV testing.  Fact Sheet for Patients: EntrepreneurPulse.com.au  Fact Sheet for Healthcare Providers: IncredibleEmployment.be  This test is not yet  approved or cleared by the [place] FDA and has been authorized for detection and/or diagnosis of SARS-CoV-2 by FDA under an Emergency Use Authorization (EUA). This EUA will remain in effect (meaning this test can be used) for the duration of the COVID-19 declaration under Section 564(b)(1) of the Act, 21 U.S.C. section 360bbb-3(b)(1), unless the authorization is terminated or revoked.  Performed at [facility], [place] [address] [place], [place] [address]          Radiology Studies: No results found.    Scheduled Meds: . amLODipine  10 mg Per Tube q AM  . feeding supplement (PROSource TF)  45 mL Per Tube Daily  . free water  200 mL Per Tube Q4H  . hydrALAZINE  25 mg Per Tube Q8H  . levETIRAcetam  1,000 mg Per Tube Q12H  . metoprolol tartrate  50 mg Per Tube BID   Continuous Infusions: . feeding supplement (OSMOLITE 1.5 CAL) 1,000 mL (02/12/21 1458)     LOS: 7 days      Debbe Odea, MD [facility] Pager: [email or web address] 02/12/2021, 3:55 PM

## 2021-02-12 NOTE — Progress Notes (Signed)
SLP Cancellation Note  Patient Details Name: Joshua [name] MRN: [id number] DOB: 1981-04-23   Cancelled treatment:       Reason Eval/Treat Not Completed: Patient declined, no reason specified (Pt was approached for treatment. He was alert, but refused all presented solids and liquids by turning his head, pursing his lips, stating "no" and hitting the SLP's had away from his mouth. SLP will attempt treatment again on a subsequent date, but these attempts by SLP and staff to encourage p.o. intake have been fruitless thus far.)  Avya Flavell I. Hardin Negus, [place], [place] Office number [phone] Pager [place] 02/12/2021, 5:10 PM

## 2021-02-12 NOTE — TOC Progression Note (Signed)
Transition of Care [facility]) - Progression Note    Patient Details  Name: Joshua [name] MRN: [id number] Date of Birth: 1981-06-26  Transition of Care [facility]) CM/SW [place], [place] Phone Number: 02/12/2021, 1:31 PM  Clinical Narrative:   Patient continues to have no bed offers at this time. CSW asked [place] to review referral.    Expected Discharge Plan: Long Term Nursing Home Barriers to Discharge: No SNF bed  Expected Discharge Plan and Services Expected Discharge Plan: Deputy     Post Acute Care Choice: Nursing Home Living arrangements for the past 2 months: Single Family Home                                       Social Determinants of Health (SDOH) Interventions    Readmission Risk Interventions Readmission Risk Prevention Plan 07/04/2020  Transportation Screening Complete  PCP or Specialist Appt within 5-7 Days Complete  Home Care Screening Complete  Medication Review (RN CM) Complete  Some recent data might be hidden

## 2021-02-12 NOTE — Plan of Care (Signed)
  Problem: Clinical Measurements: Goal: Complications related to the disease process, condition or treatment will be avoided or minimized Outcome: Progressing Goal: Diagnostic test results will improve Outcome: Progressing   Problem: Safety: Goal: Verbalization of understanding the information provided will improve Outcome: Progressing   Problem: Self-Concept: Goal: Level of anxiety will decrease Outcome: Progressing

## 2021-02-13 DIAGNOSIS — E43 Unspecified severe protein-calorie malnutrition: Secondary | ICD-10-CM | POA: Diagnosis not present

## 2021-02-13 LAB — GLUCOSE, CAPILLARY
Glucose-Capillary: 107 mg/dL — ABNORMAL HIGH (ref 70–99)
Glucose-Capillary: 110 mg/dL — ABNORMAL HIGH (ref 70–99)
Glucose-Capillary: 117 mg/dL — ABNORMAL HIGH (ref 70–99)
Glucose-Capillary: 118 mg/dL — ABNORMAL HIGH (ref 70–99)
Glucose-Capillary: 121 mg/dL — ABNORMAL HIGH (ref 70–99)
Glucose-Capillary: 122 mg/dL — ABNORMAL HIGH (ref 70–99)
Glucose-Capillary: 128 mg/dL — ABNORMAL HIGH (ref 70–99)

## 2021-02-13 MED ORDER — PROSOURCE TF PO LIQD
45.0000 mL | Freq: Two times a day (BID) | ORAL | Status: DC
  Administered 2021-02-13 – 2021-03-05 (×40): 45 mL
  Filled 2021-02-13 (×41): qty 45

## 2021-02-13 NOTE — Progress Notes (Signed)
PROGRESS NOTE    Joshua [name]   [id number]  DOB: 12/29/1980  DOA: 02/04/2021 PCP: Patient, [name]   Brief Narrative:  Joshua [name] is a 40 year old male with a history of intracranial hemorrhage in 08/2019 requiring a hemicraniectomy and evacuation was discharged home in May 2021.  The patient has subsequently had multiple seizures with multiple ER visits for them. He presented on 02/04/2021 due to family's concern that he was having another seizure.  He had a leftward gaze which was noted in the ED as well.  Despite receiving Ativan, it did not improve and therefore he was given an IV Keppra load.   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  CKD 3 - Creatinine stable  Dysphagia -  Has PEG tube and is tolerating tube feeds - also on D 2 diet with thin liquids but is not eating or drinking (declines to)  HTN with  H/o PRES - BP stable - continue Norvasc, Hydralazine and Metoprolol  Time spent in minutes: 35 DVT prophylaxis: SCDs Start: 02/05/21 0213 Code Status: Full code Family Communication:  Level of Care: Level of care: Telemetry Medical Disposition Plan:  Status is: Inpatient  Remains inpatient appropriate because:Unsafe d/c plan   Dispo: The patient is from: Home              Anticipated d/c is to: SNF              Anticipated d/c date is: > 3 days              Patient currently is medically stable to d/c.   Difficult to place patient Yes   Consultants:   Neurology   Antimicrobials:  Anti-infectives (From admission, onward)   None        Objective: Vitals:   02/12/21 1954 02/12/21 2341 02/13/21 0343 02/13/21 0808  BP: 114/85 106/77 106/83 (!) 124/96  Pulse: 83 79 61 69  Resp: '18 18 18 18  '$ Temp: 98.7 F (37.1 C) 98.2 F (36.8 C) 98.6 F (37 C) 98 F (36.7 C)  TempSrc: Oral Oral Oral   SpO2: 98% 97% 99% 100%  Weight:   43 kg   Height:        Intake/Output Summary (Last 24 hours) at 02/13/2021 1103 Last data filed at 02/13/2021 0600 Gross per 24 hour  Intake 4140 ml  Output 1300 ml  Net 2840 ml   Filed Weights   02/09/21 0348 02/10/21 0420 02/13/21 0343  Weight: 41.3 kg 42.2 kg 43 kg    Examination: General exam: Appears comfortable  HEENT: PERRLA, oral mucosa moist, no sclera icterus or thrush Respiratory system: Clear to auscultation. Respiratory effort normal. Cardiovascular system: S1 & S2 heard, RRR.   Gastrointestinal system: Abdomen soft, non-tender, PEG tube present- nondistended. Normal bowel sounds. Central nervous system: Alert and oriented. No focal neurological deficits. Extremities: No cyanosis, clubbing or edema Skin: No rashes or ulcers Psychiatry:  Mood & affect appropriate.     Data Reviewed: I have personally reviewed following labs and imaging studies  CBC: Recent Labs  Lab 02/07/21 0249 02/08/21 0555 02/10/21  0228  WBC 8.5 7.4 5.5  HGB 13.6 14.0 13.5  HCT 41.0 41.3 43.3  MCV 80.1 78.8* 81.2  PLT 271 270 [id number]   Basic Metabolic Panel: Recent Labs  Lab 02/07/21 0249 02/07/21 1659 02/08/21 0555 02/08/21 1912 02/09/21 0447 02/10/21 0228  NA 136  --  139  --   --  139  K 3.5  --  4.0  --   --  4.0  CL 100  --  101  --   --  103  CO2 24  --  26  --   --  25  GLUCOSE 88  --  112*  --   --  123*  BUN 9  --  14  --   --  16  CREATININE 1.44*  --  1.46*  --   --  1.32*  CALCIUM 9.7  --  9.6  --   --  9.4  MG  --  1.7 1.9 1.8 1.9  --   PHOS  --  3.5 3.8 3.4 4.0  --    GFR: Estimated Creatinine Clearance: 44.8 mL/min (A) (by C-G formula based on SCr of 1.32 mg/dL  (H)). Liver Function Tests: Recent Labs  Lab 02/10/21 0228  AST 10*  ALT 11  ALKPHOS 67  BILITOT 0.7  PROT 6.9  ALBUMIN 3.3*   No results for input(s): LIPASE, AMYLASE in the last 168 hours. No results for input(s): AMMONIA in the last 168 hours. Coagulation Profile: No results for input(s): INR, PROTIME in the last 168 hours. Cardiac Enzymes: No results for input(s): CKTOTAL, CKMB, CKMBINDEX, TROPONINI in the last 168 hours. BNP (last 3 results) No results for input(s): PROBNP in the last 8760 hours. HbA1C: No results for input(s): HGBA1C in the last 72 hours. CBG: Recent Labs  Lab 02/12/21 1610 02/12/21 1952 02/12/21 2340 02/13/21 0344 02/13/21 0810  GLUCAP 120* 115* 121* 121* 117*   Lipid Profile: No results for input(s): CHOL, HDL, LDLCALC, TRIG, CHOLHDL, LDLDIRECT in the last 72 hours. Thyroid Function Tests: No results for input(s): TSH, T4TOTAL, FREET4, T3FREE, THYROIDAB in the last 72 hours. Anemia Panel: No results for input(s): VITAMINB12, FOLATE, FERRITIN, TIBC, IRON, RETICCTPCT in the last 72 hours. Urine analysis:    Component Value Date/Time   COLORURINE YELLOW 10/04/2020 1210   APPEARANCEUR HAZY (A) 10/04/2020 1210   LABSPEC 1.012 10/04/2020 1210   PHURINE 8.0 10/04/2020 1210   GLUCOSEU NEGATIVE 10/04/2020 1210   HGBUR NEGATIVE 10/04/2020 1210   BILIRUBINUR NEGATIVE 10/04/2020 1210   KETONESUR NEGATIVE 10/04/2020 1210   PROTEINUR NEGATIVE 10/04/2020 1210   UROBILINOGEN 1.0 04/05/2009 0021   NITRITE NEGATIVE 10/04/2020 1210   LEUKOCYTESUR TRACE (A) 10/04/2020 1210   Sepsis Labs: '@LABRCNTIP'$ (procalcitonin:4,lacticidven:4) ) Recent Results (from the past 240 hour(s))  Resp Panel by RT-PCR (Flu A&B, Covid) Nasopharyngeal Swab     Status: None   Collection Time: 02/05/21 12:57 AM   Specimen: Nasopharyngeal Swab; Nasopharyngeal(NP) swabs in vial transport medium  Result Value Ref Range Status   SARS Coronavirus 2 by RT PCR NEGATIVE NEGATIVE Final     Comment: (NOTE) SARS-CoV-2 target nucleic acids are NOT DETECTED.  The SARS-CoV-2 RNA is generally detectable in upper respiratory specimens during the acute phase of infection. The lowest concentration of SARS-CoV-2 viral copies this assay can detect is 138 copies/mL. A negative result does not preclude SARS-Cov-2 infection and should not be used as the sole basis for treatment or other patient management decisions. A negative result may occur  with  improper specimen collection/handling, submission of specimen other than nasopharyngeal swab, presence of viral mutation(s) within the areas targeted by this assay, and inadequate number of viral copies(<138 copies/mL). A negative result must be combined with clinical observations, patient history, and epidemiological information. The expected result is Negative.  Fact Sheet for Patients:  EntrepreneurPulse.com.au  Fact Sheet for Healthcare Providers:  IncredibleEmployment.be  This test is no t yet approved or cleared by the [place] FDA and  has been authorized for detection and/or diagnosis of SARS-CoV-2 by FDA under an Emergency Use Authorization (EUA). This EUA will remain  in effect (meaning this test can be used) for the duration of the COVID-19 declaration under Section 564(b)(1) of the Act, 21 U.S.C.section 360bbb-3(b)(1), unless the authorization is terminated  or revoked sooner.       Influenza A by PCR NEGATIVE NEGATIVE Final   Influenza B by PCR NEGATIVE NEGATIVE Final    Comment: (NOTE) The Xpert Xpress SARS-CoV-2/FLU/RSV plus assay is intended as an aid in the diagnosis of influenza from Nasopharyngeal swab specimens and should not be used as a sole basis for treatment. Nasal washings and aspirates are unacceptable for Xpert Xpress SARS-CoV-2/FLU/RSV testing.  Fact Sheet for Patients: EntrepreneurPulse.com.au  Fact Sheet for Healthcare  Providers: IncredibleEmployment.be  This test is not yet approved or cleared by the [place] FDA and has been authorized for detection and/or diagnosis of SARS-CoV-2 by FDA under an Emergency Use Authorization (EUA). This EUA will remain in effect (meaning this test can be used) for the duration of the COVID-19 declaration under Section 564(b)(1) of the Act, 21 U.S.C. section 360bbb-3(b)(1), unless the authorization is terminated or revoked.  Performed at [facility], [place] [address], [place], [place] [address]          Radiology Studies: No results found.    Scheduled Meds: . amLODipine  10 mg Per Tube q AM  . feeding supplement (PROSource TF)  45 mL Per Tube Daily  . free water  200 mL Per Tube Q4H  . hydrALAZINE  25 mg Per Tube Q8H  . levETIRAcetam  1,000 mg Per Tube Q12H  . metoprolol tartrate  50 mg Per Tube BID   Continuous Infusions: . feeding supplement (OSMOLITE 1.5 CAL) 1,000 mL (02/13/21 0548)     LOS: 8 days      Debbe Odea, MD [facility] Pager: [email or web address] 02/13/2021, 11:03 AM

## 2021-02-13 NOTE — Progress Notes (Signed)
Nutrition Follow-up  RD working remotely.  DOCUMENTATION CODES:   Underweight  INTERVENTION:   Continue tube feeding via PEG: - Osmolite 1.5 @ 60 ml/hr (1440 ml/day) - ProSource TF 45 ml BID - Free water flushes of 200 ml q 4 hours  Tube feeding regimen provides 2240 kcal, 112 grams of protein, and 1097 ml of H2O.   Total free water with flushes: 2297 ml  NUTRITION DIAGNOSIS:   Inadequate oral intake related to dysphagia as evidenced by meal completion < 25%.  Ongoing  GOAL:   Patient will meet greater than or equal to 90% of their needs  Met via TF  MONITOR:   TF tolerance,Labs,Weight trends,I & O's  REASON FOR ASSESSMENT:   Consult Assessment of nutrition requirement/status,Enteral/tube feeding initiation and management  ASSESSMENT:   Pt admitted with seizure in the setting of large L ICH s/p craniotomy, recurrent seizures. PMH includes large ICH (08/2019) requiring hemicraniectomy and evacuation followed by extensive hospitalization; pt discharged in May 2021. Pt has since experienced the development of seizures resulting in multiple ER visits/hospital admissions for concern for seizures/seizure-like activity  Noted plan for pt to d/c to SNF. Per TOC team note, pt does not have any bed offers at this time.  Pt is nonverbal so did not attempt to speak with pt via phone call to room.  Per SLP notes, pt declining all PO trials, turning head and pursing lips. Pt remains on dysphagia 2 diet with thin liquids but all meal completions documented as 0%. Pt requiring tube feeds to meet 100% of nutritional needs.  Discussed pt with RN who reports no issues with tube feedings. Will continue with current regimen.  Pt with a weight loss of 22.8 kg since 10/04/20. This is a 35% weight loss in less than 5 months which is severe and significant for timeframe. Pt likely meets criteria for severe malnutrition. Need to confirm with NFPE.  Current TF: Osmolite 1.5 @ 60 ml/hr,  ProSource TF 45 ml daily, free water flushes 200 ml q 4 hours  Medications reviewed.  Labs reviewed. CBG's: 115-121 x 24 hours  UOP: 1300 ml x 24 hours  NUTRITION - FOCUSED PHYSICAL EXAM:  Unable to complete at this time. RD working remotely.  Diet Order:   Diet Order            DIET DYS 2 Room service appropriate? Yes; Fluid consistency: Thin  Diet effective now                 EDUCATION NEEDS:   No education needs have been identified at this time  Skin:  Skin Assessment: Reviewed RN Assessment  Last BM:  02/12/21  Height:   Ht Readings from Last 1 Encounters:  02/06/21 6' (1.829 m)    Weight:   Wt Readings from Last 1 Encounters:  02/13/21 43 kg    BMI:  Body mass index is 12.86 kg/m.  Estimated Nutritional Needs:   Kcal:  2000-2200  Protein:  100-115 grams  Fluid:  >2L    Gustavus Bryant, MS, RD, LDN Inpatient Clinical Dietitian Please see AMiON for contact information.

## 2021-02-13 NOTE — Progress Notes (Signed)
  Speech Language Pathology Treatment: Dysphagia  Patient Details Name: Joshua [name] MRN: [id number] DOB: 1981/07/18 Today's Date: 02/13/2021 Time: [id number] SLP Time Calculation (min) (ACUTE ONLY): 20 min  Assessment / Plan / Recommendation Clinical Impression  Pt seen for diet tolerance. Per chart review, PO intake has remained minimal, suspected from cognitive deficits of patient with pt refusal. Pts ex girlfriend at bedside. Pt initially resistive to POs by SLP, however when ex assisted with feeding, oral readiness and acceptance greatly improved and pt allowed SLP to assist with POs. Pt consumed thin liquids via straw and puree and soft textures via tsp. No overt s/sx of aspiration with POs. Mildly prolonged mastication exhibited with softer solids, thin liquid alternation assisted greatly with oral clearance. Continue dysphagia 2 (finely chopped) and thin liquids with meds via PEG. No further ST needs identified.    HPI HPI: Joshua [name] is a 40 y.o. 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 Presents today by EMS for concern for seizures. Patient at baseline is nonverbal with right hemiplegia and requires full assistance with ADLs, and is being cared for at home by family. CT of head without acute intracranial abnormality. 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 Chest x ray with mild hazy airspace opacity within the right mid and lower lung which could be due to atelectasis and/or infectious etiology      SLP Plan  Discharge SLP treatment due to (comment)       Recommendations  Diet recommendations: Dysphagia 2 (fine chop);Thin liquid Liquids  provided via: Straw Medication Administration: Via alternative means Supervision: Full supervision/cueing for compensatory strategies;Staff to assist with self feeding Compensations: Small sips/bites;Slow rate;Minimize environmental distractions;Follow solids with liquid Postural Changes and/or Swallow Maneuvers: Seated upright 90 degrees;Upright 30-60 min after meal                Oral Care Recommendations: Oral care BID Follow up Recommendations: 24 hour supervision/assistance;Skilled Nursing facility SLP Visit Diagnosis: Dysphagia, oropharyngeal phase (R13.12) Plan: Discharge SLP treatment due to (comment)       GO                Hayden Rasmussen MA, CCC-SLP Acute Rehabilitation Services   02/13/2021, 11:56 AM

## 2021-02-13 NOTE — Plan of Care (Signed)
  Problem: Nutrition: Goal: Adequate nutrition will be maintained Outcome: Progressing   Problem: Coping: Goal: Level of anxiety will decrease Outcome: Progressing   Problem: Elimination: Goal: Will not experience complications related to bowel motility Outcome: Progressing   Problem: Safety: Goal: Ability to remain free from injury will improve Outcome: Progressing   

## 2021-02-14 DIAGNOSIS — E43 Unspecified severe protein-calorie malnutrition: Secondary | ICD-10-CM | POA: Diagnosis not present

## 2021-02-14 LAB — GLUCOSE, CAPILLARY
Glucose-Capillary: 113 mg/dL — ABNORMAL HIGH (ref 70–99)
Glucose-Capillary: 104 mg/dL — ABNORMAL HIGH (ref 70–99)
Glucose-Capillary: 125 mg/dL — ABNORMAL HIGH (ref 70–99)
Glucose-Capillary: 118 mg/dL — ABNORMAL HIGH (ref 70–99)
Glucose-Capillary: 114 mg/dL — ABNORMAL HIGH (ref 70–99)

## 2021-02-14 NOTE — Progress Notes (Signed)
PROGRESS NOTE    Moustapha Palko   [id number]  DOB: 12/07/1981  DOA: 02/04/2021 PCP: Patient, [name]   Brief Narrative:  Karma Streitz is a 40 year old male with a history of intracranial hemorrhage in 08/2019 requiring a hemicraniectomy and evacuation was discharged home in May 2021.  The patient has subsequently had multiple seizures with multiple ER visits for them. He presented on 02/04/2021 due to family's concern that he was having another seizure.  He had a leftward gaze which was noted in the ED as well.  Despite receiving Ativan, it did not improve and therefore he was given an IV Keppra load.   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   Difficult to place patient Yes   Consultants:   Neurology   Antimicrobials:  Anti-infectives (From admission, onward)   None        Objective: Vitals:   02/13/21 2024 02/13/21 2343 02/14/21 0354 02/14/21 0746  BP: (!) 135/109 (!) 121/100 (!) 125/94 (!) 136/99  Pulse: 96 88 80 82  Resp: '17 18 17   '$ Temp: 98.3 F (36.8 C) (!) 100.7 F (38.2 C) 98.3 F (36.8 C) 98 F (36.7 C)  TempSrc: Oral Oral Oral Oral  SpO2: 100% 100% 99% 99%  Weight:      Height:        Intake/Output Summary (Last 24 hours) at 02/14/2021 1032 Last data filed at 02/14/2021 1000 Gross per 24 hour  Intake 380 ml  Output 1250 ml  Net -870 ml   Filed Weights   02/09/21 0348 02/10/21 0420 02/13/21 0343  Weight: 41.3 kg 42.2 kg 43 kg    Examination: General exam: Appears comfortable  HEENT: PERRLA, oral mucosa moist, no sclera icterus or thrush Respiratory system: Clear to auscultation. Respiratory effort normal. Cardiovascular system: S1 & S2 heard, RRR.   Gastrointestinal system: Abdomen soft, non-tender, PEG tube present- nondistended. Normal bowel sounds. Central nervous system: Alert and oriented. No focal neurological deficits. Extremities: No cyanosis, clubbing or edema Skin: No rashes or ulcers Psychiatry:  Mood & affect appropriate.     Data Reviewed: I have personally reviewed following labs and imaging studies  CBC: Recent Labs  Lab 02/08/21 0555  02/10/21 0228  WBC 7.4 5.5  HGB 14.0 13.5  HCT 41.3 43.3  MCV 78.8* 81.2  PLT 270 [id number]   Basic Metabolic Panel: Recent Labs  Lab 02/07/21 1659 02/08/21 0555 02/08/21 1912 02/09/21 0447 02/10/21 0228  NA  --  139  --   --  139  K  --  4.0  --   --  4.0  CL  --  101  --   --  103  CO2  --  26  --   --  25  GLUCOSE  --  112*  --   --  123*  BUN  --  14  --   --  16  CREATININE  --  1.46*  --   --  1.32*  CALCIUM  --  9.6  --   --  9.4  MG 1.7 1.9 1.8 1.9  --   PHOS 3.5 3.8 3.4 4.0  --    GFR: Estimated Creatinine Clearance: 44.8 mL/min (A) (by C-G formula based on SCr of 1.32 mg/dL (H)). Liver Function Tests: Recent Labs  Lab 02/10/21 0228  AST 10*   ALT 11  ALKPHOS 67  BILITOT 0.7  PROT 6.9  ALBUMIN 3.3*   No results for input(s): LIPASE, AMYLASE in the last 168 hours. No results for input(s): AMMONIA in the last 168 hours. Coagulation Profile: No results for input(s): INR, PROTIME in the last 168 hours. Cardiac Enzymes: No results for input(s): CKTOTAL, CKMB, CKMBINDEX, TROPONINI in the last 168 hours. BNP (last 3 results) No results for input(s): PROBNP in the last 8760 hours. HbA1C: No results for input(s): HGBA1C in the last 72 hours. CBG: Recent Labs  Lab 02/13/21 1951 02/13/21 2108 02/13/21 2346 02/14/21 0355 02/14/21 0749  GLUCAP 110* 118* 107* 113* 104*   Lipid Profile: No results for input(s): CHOL, HDL, LDLCALC, TRIG, CHOLHDL, LDLDIRECT in the last 72 hours. Thyroid Function Tests: No results for input(s): TSH, T4TOTAL, FREET4, T3FREE, THYROIDAB in the last 72 hours. Anemia Panel: No results for input(s): VITAMINB12, FOLATE, FERRITIN, TIBC, IRON, RETICCTPCT in the last 72 hours. Urine analysis:    Component Value Date/Time   COLORURINE YELLOW 10/04/2020 1210   APPEARANCEUR HAZY (A) 10/04/2020 1210   LABSPEC 1.012 10/04/2020 1210   PHURINE 8.0 10/04/2020 1210   GLUCOSEU NEGATIVE 10/04/2020 1210   HGBUR NEGATIVE 10/04/2020 1210   BILIRUBINUR NEGATIVE 10/04/2020 1210   KETONESUR NEGATIVE 10/04/2020 1210   PROTEINUR NEGATIVE 10/04/2020 1210   UROBILINOGEN 1.0 04/05/2009 0021   NITRITE NEGATIVE 10/04/2020 1210   LEUKOCYTESUR TRACE (A) 10/04/2020 1210   Sepsis Labs: '@LABRCNTIP'$ (procalcitonin:4,lacticidven:4) ) Recent Results (from the past 240 hour(s))  Resp Panel by RT-PCR (Flu A&B, Covid) Nasopharyngeal Swab     Status: None   Collection Time: 02/05/21 12:57 AM   Specimen: Nasopharyngeal Swab; Nasopharyngeal(NP) swabs in vial transport medium  Result Value Ref Range Status   SARS Coronavirus 2 by RT PCR NEGATIVE NEGATIVE Final    Comment: (NOTE) SARS-CoV-2 target nucleic acids are NOT  DETECTED.  The SARS-CoV-2 RNA is generally detectable in upper respiratory specimens during the acute phase of infection. The lowest concentration of SARS-CoV-2 viral copies this assay can detect is 138 copies/mL. A negative result does not preclude SARS-Cov-2 infection and should not be used as the sole basis for treatment or other patient management decisions. A negative result may occur with  improper specimen collection/handling, submission of specimen other than nasopharyngeal swab, presence of viral mutation(s) within the areas targeted  by this assay, and inadequate number of viral copies(<138 copies/mL). A negative result must be combined with clinical observations, patient history, and epidemiological information. The expected result is Negative.  Fact Sheet for Patients:  EntrepreneurPulse.com.au  Fact Sheet for Healthcare Providers:  IncredibleEmployment.be  This test is no t yet approved or cleared by the [place] FDA and  has been authorized for detection and/or diagnosis of SARS-CoV-2 by FDA under an Emergency Use Authorization (EUA). This EUA will remain  in effect (meaning this test can be used) for the duration of the COVID-19 declaration under Section 564(b)(1) of the Act, 21 U.S.C.section 360bbb-3(b)(1), unless the authorization is terminated  or revoked sooner.       Influenza A by PCR NEGATIVE NEGATIVE Final   Influenza B by PCR NEGATIVE NEGATIVE Final    Comment: (NOTE) The Xpert Xpress SARS-CoV-2/FLU/RSV plus assay is intended as an aid in the diagnosis of influenza from Nasopharyngeal swab specimens and should not be used as a sole basis for treatment. Nasal washings and aspirates are unacceptable for Xpert Xpress SARS-CoV-2/FLU/RSV testing.  Fact Sheet for Patients: EntrepreneurPulse.com.au  Fact Sheet for Healthcare Providers: IncredibleEmployment.be  This test is not yet  approved or cleared by the [place] FDA and has been authorized for detection and/or diagnosis of SARS-CoV-2 by FDA under an Emergency Use Authorization (EUA). This EUA will remain in effect (meaning this test can be used) for the duration of the COVID-19 declaration under Section 564(b)(1) of the Act, 21 U.S.C. section 360bbb-3(b)(1), unless the authorization is terminated or revoked.  Performed at [facility], [place] [address], [place], [place] [address]          Radiology Studies: No results found.    Scheduled Meds: . amLODipine  10 mg Per Tube q AM  . feeding supplement (PROSource TF)  45 mL Per Tube BID  . free water  200 mL Per Tube Q4H  . hydrALAZINE  25 mg Per Tube Q8H  . levETIRAcetam  1,000 mg Per Tube Q12H  . metoprolol tartrate  50 mg Per Tube BID   Continuous Infusions: . feeding supplement (OSMOLITE 1.5 CAL) 60 mL/hr at 02/14/21 1000     LOS: 9 days      Debbe Odea, MD [facility] Pager: [email or web address] 02/14/2021, 10:32 AM

## 2021-02-15 DIAGNOSIS — E43 Unspecified severe protein-calorie malnutrition: Secondary | ICD-10-CM | POA: Diagnosis not present

## 2021-02-15 LAB — GLUCOSE, CAPILLARY
Glucose-Capillary: 119 mg/dL — ABNORMAL HIGH (ref 70–99)
Glucose-Capillary: 130 mg/dL — ABNORMAL HIGH (ref 70–99)
Glucose-Capillary: 84 mg/dL (ref 70–99)
Glucose-Capillary: 98 mg/dL (ref 70–99)

## 2021-02-15 NOTE — Progress Notes (Signed)
IR received request to evaluate g-tube for leaking. Patient assessed at the bedside with RN and tech. Patient combative but I was able to adjust the bumper closer to the skin. I flushed the g-tube two separate times with 60 ml of sterile water - no leaking observed. Tube reconnected to tube feeds, all connections checked and tightened. Tube feeds resumed with no evidence of leaking.   Soyla Dryer, [place] [phone] 02/15/2021, 9:44 AM

## 2021-02-15 NOTE — Progress Notes (Signed)
PROGRESS NOTE    Joshua [name]   [id number]  DOB: January 27, 1981  DOA: 02/04/2021 PCP: Patient, [name]   Brief Narrative:  Joshua [name] is a 40 year old male with a history of intracranial hemorrhage in 08/2019 requiring a hemicraniectomy and evacuation was discharged home in May 2021.  The patient has subsequently had multiple seizures with multiple ER visits for them. He presented on 02/04/2021 due to family's concern that he was having another seizure.  He had a leftward gaze which was noted in the ED as well.  Despite receiving Ativan, it did not improve and therefore he was given an IV Keppra load.   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 -  Has PEG tube and is tolerating tube feeds - also on D 2 diet with thin liquids but is not eating or drinking (declines to - continue Prostat supplement  - of note, tube feeds noted to be leaking overnight- IR consulted and were able to adjust the bumper to fix the problem. Cont tube feeds.      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   Difficult to place patient Yes   Consultants:   Neurology   Antimicrobials:  Anti-infectives (From admission, onward)   None       Objective: Vitals:   02/14/21 2330 02/15/21 0346 02/15/21 0500 02/15/21 0757  BP: 110/81 120/90  116/82  Pulse: 73 80  78  Resp: '18 18  20  '$ Temp: 98 F (36.7 C) 98.1 F (36.7 C)  (!) 97.4 F (36.3 C)  TempSrc: Oral Oral  Oral  SpO2: 100% 100%  100%  Weight:   44 kg   Height:        Intake/Output Summary (Last 24 hours) at 02/15/2021 1018 Last data filed at 02/15/2021 0916 Gross per 24 hour  Intake --  Output 550 ml  Net -550 ml   Filed Weights   02/10/21 0420 02/13/21 0343 02/15/21 0500  Weight: 42.2 kg 43 kg 44 kg    Examination: General exam: Appears comfortable  HEENT: PERRLA, oral mucosa moist, no sclera icterus or thrush Respiratory system: Clear to auscultation. Respiratory effort normal. Cardiovascular system: S1 & S2 heard, RRR.   Gastrointestinal system: Abdomen soft, non-tender, PEG tube present- nondistended. Normal bowel sounds. Central nervous system: Alert and oriented. No focal neurological deficits. Extremities: No cyanosis, clubbing or edema Skin: No rashes or ulcers Psychiatry:  Mood &  affect appropriate.     Data Reviewed: I have personally reviewed following labs and imaging studies  CBC: Recent Labs  Lab 02/10/21 0228  WBC 5.5  HGB 13.5  HCT 43.3  MCV 81.2  PLT [id number]   Basic Metabolic Panel: Recent Labs  Lab 02/08/21 1912 02/09/21 0447 02/10/21 0228  NA  --   --  139  K  --   --  4.0  CL  --   --  103  CO2  --   --  25  GLUCOSE  --   --  123*  BUN  --   --  16  CREATININE  --   --  1.32*  CALCIUM  --   --  9.4  MG 1.8 1.9  --   PHOS 3.4 4.0  --    GFR: Estimated Creatinine Clearance: 45.8 mL/min (A) (by C-G formula based on SCr of 1.32 mg/dL (H)). Liver Function Tests: Recent Labs  Lab 02/10/21 0228  AST 10*  ALT 11  ALKPHOS 67  BILITOT 0.7  PROT 6.9   ALBUMIN 3.3*   No results for input(s): LIPASE, AMYLASE in the last 168 hours. No results for input(s): AMMONIA in the last 168 hours. Coagulation Profile: No results for input(s): INR, PROTIME in the last 168 hours. Cardiac Enzymes: No results for input(s): CKTOTAL, CKMB, CKMBINDEX, TROPONINI in the last 168 hours. BNP (last 3 results) No results for input(s): PROBNP in the last 8760 hours. HbA1C: No results for input(s): HGBA1C in the last 72 hours. CBG: Recent Labs  Lab 02/14/21 1307 02/14/21 2049 02/14/21 2328 02/15/21 0343 02/15/21 0756  GLUCAP 125* 118* 114* 119* 84   Lipid Profile: No results for input(s): CHOL, HDL, LDLCALC, TRIG, CHOLHDL, LDLDIRECT in the last 72 hours. Thyroid Function Tests: No results for input(s): TSH, T4TOTAL, FREET4, T3FREE, THYROIDAB in the last 72 hours. Anemia Panel: No results for input(s): VITAMINB12, FOLATE, FERRITIN, TIBC, IRON, RETICCTPCT in the last 72 hours. Urine analysis:    Component Value Date/Time   COLORURINE YELLOW 10/04/2020 1210   APPEARANCEUR HAZY (A) 10/04/2020 1210   LABSPEC 1.012 10/04/2020 1210   PHURINE 8.0 10/04/2020 1210   GLUCOSEU NEGATIVE 10/04/2020 1210   HGBUR NEGATIVE 10/04/2020 1210   BILIRUBINUR NEGATIVE 10/04/2020 1210   KETONESUR NEGATIVE 10/04/2020 1210   PROTEINUR NEGATIVE 10/04/2020 1210   UROBILINOGEN 1.0 04/05/2009 0021   NITRITE NEGATIVE 10/04/2020 1210   LEUKOCYTESUR TRACE (A) 10/04/2020 1210   Sepsis Labs: '@LABRCNTIP'$ (procalcitonin:4,lacticidven:4) ) No results found for this or any previous visit (from the past 240 hour(s)).       Radiology Studies: No results found.    Scheduled Meds: . amLODipine  10 mg Per Tube q AM  . feeding supplement (PROSource TF)  45 mL Per Tube BID  . free water  200 mL Per Tube Q4H  . hydrALAZINE  25 mg Per Tube Q8H  . levETIRAcetam  1,000 mg Per Tube Q12H  . metoprolol tartrate  50 mg Per Tube BID   Continuous Infusions: . feeding supplement  (OSMOLITE 1.5 CAL) 1,000 mL (02/14/21 2100)     LOS: 10 days      Debbe Odea, MD [facility] Pager: [email or web address] 02/15/2021, 10:18 AM

## 2021-02-16 DIAGNOSIS — E43 Unspecified severe protein-calorie malnutrition: Secondary | ICD-10-CM | POA: Diagnosis not present

## 2021-02-16 LAB — GLUCOSE, CAPILLARY
Glucose-Capillary: 112 mg/dL — ABNORMAL HIGH (ref 70–99)
Glucose-Capillary: 131 mg/dL — ABNORMAL HIGH (ref 70–99)
Glucose-Capillary: 118 mg/dL — ABNORMAL HIGH (ref 70–99)
Glucose-Capillary: 106 mg/dL — ABNORMAL HIGH (ref 70–99)

## 2021-02-16 NOTE — Progress Notes (Addendum)
PROGRESS NOTE    Joshua [name]   [id number]  DOB: 01-10-1981  DOA: 02/04/2021 PCP: Patient, [name]   Brief Narrative:  Joshua [name] is a 40 year old male with a history of intracranial hemorrhage in 08/2019 requiring a hemicraniectomy and evacuation was discharged home in May 2021.  The patient has subsequently had multiple seizures with multiple ER visits for them. He presented on 02/04/2021 due to family's concern that he was having another seizure.  He had a leftward gaze which was noted in the ED as well.  Despite receiving Ativan, it did not improve and therefore he was given an IV Keppra load.   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 -  Has PEG tube and is tolerating tube feeds - also on D 2 diet with thin liquids but is not eating or drinking (declines to) - continue Prostat supplement  - 2/19>  tube feeds noted to be leaking overnight- IR consulted and were able to adjust the bumper to fix the problem. Cont tube feeds.      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   Difficult to place patient Yes   Consultants:   Neurology   Antimicrobials:  Anti-infectives (From admission, onward)   None       Objective: Vitals:   02/16/21 0407 02/16/21 0500 02/16/21 0805 02/16/21 1311  BP: 139/82  135/79 125/83  Pulse: 80  99 83  Resp: '18  18 18  '$ Temp: 98.4 F (36.9 C)  97.7 F (36.5 C) 98.7 F (37.1 C)  TempSrc: Oral  Oral Oral  SpO2: 100%  100% 100%  Weight:  44.9 kg    Height:        Intake/Output Summary (Last 24 hours) at 02/16/2021 1325 Last data filed at 02/16/2021 1050 Gross per 24 hour  Intake 0 ml  Output --  Net 0 ml   Filed Weights   02/13/21 0343 02/15/21 0500 02/16/21 0500  Weight: 43 kg 44 kg 44.9 kg    Examination: General exam: Appears comfortable  HEENT: PERRLA, oral mucosa moist, no sclera icterus or thrush Respiratory system: Clear to auscultation. Respiratory effort normal. Cardiovascular system: S1 & S2 heard, RRR.   Gastrointestinal system: Abdomen soft, non-tender, PEG tube present- nondistended. Normal bowel sounds. Central nervous system: Alert - nonverbal, does not follow commands or attempt to communicate.  Extremities: No cyanosis, clubbing or edema  Skin: No rashes or ulcers Psychiatry:  Cannot assess    Data Reviewed: I have personally reviewed following labs and imaging studies  CBC: Recent Labs  Lab 02/10/21 0228  WBC 5.5  HGB 13.5  HCT 43.3  MCV 81.2  PLT Q000111Q   Basic Metabolic Panel: Recent Labs  Lab 02/10/21 0228  NA 139  K 4.0  CL 103  CO2 25  GLUCOSE 123*  BUN 16  CREATININE 1.32*  CALCIUM 9.4   GFR: Estimated Creatinine Clearance: 46.8 mL/min (A) (by C-G formula based on SCr of 1.32 mg/dL (H)). Liver Function Tests: Recent Labs  Lab 02/10/21 0228  AST 10*  ALT 11  ALKPHOS 67  BILITOT 0.7  PROT 6.9  ALBUMIN 3.3*   No results for input(s): LIPASE, AMYLASE in the last 168 hours. No results for input(s): AMMONIA in the  last 168 hours. Coagulation Profile: No results for input(s): INR, PROTIME in the last 168 hours. Cardiac Enzymes: No results for input(s): CKTOTAL, CKMB, CKMBINDEX, TROPONINI in the last 168 hours. BNP (last 3 results) No results for input(s): PROBNP in the last 8760 hours. HbA1C: No results for input(s): HGBA1C in the last 72 hours. CBG: Recent Labs  Lab 02/15/21 0756 02/15/21 2019 02/15/21 2351 02/16/21 0404 02/16/21 0849  GLUCAP 84 98 130* 112* 131*   Lipid Profile: No results for input(s): CHOL, HDL, LDLCALC, TRIG, CHOLHDL, LDLDIRECT in the last 72 hours. Thyroid Function Tests: No results for input(s): TSH, T4TOTAL, FREET4, T3FREE, THYROIDAB in the last 72 hours. Anemia Panel: No results for input(s): VITAMINB12, FOLATE, FERRITIN, TIBC, IRON, RETICCTPCT in the last 72 hours. Urine analysis:    Component Value Date/Time   COLORURINE YELLOW 10/04/2020 1210   APPEARANCEUR HAZY (A) 10/04/2020 1210   LABSPEC 1.012 10/04/2020 1210   PHURINE 8.0 10/04/2020 1210   GLUCOSEU NEGATIVE 10/04/2020 1210   HGBUR NEGATIVE 10/04/2020 1210   BILIRUBINUR NEGATIVE 10/04/2020 1210   KETONESUR NEGATIVE 10/04/2020 1210   PROTEINUR NEGATIVE 10/04/2020 1210   UROBILINOGEN 1.0 04/05/2009 0021   NITRITE NEGATIVE 10/04/2020 1210   LEUKOCYTESUR TRACE (A) 10/04/2020 1210   Sepsis Labs: '@LABRCNTIP'$ (procalcitonin:4,lacticidven:4) ) No results found for this or any previous visit (from the past 240 hour(s)).       Radiology Studies: No results found.    Scheduled Meds: . amLODipine  10 mg Per Tube q AM  . feeding supplement (PROSource TF)  45 mL Per Tube BID  . free water  200 mL Per Tube Q4H  . hydrALAZINE  25 mg Per Tube Q8H  . levETIRAcetam  1,000 mg Per Tube Q12H  . metoprolol tartrate  50 mg Per Tube BID   Continuous Infusions: . feeding supplement (OSMOLITE 1.5 CAL) 1,000 mL (02/15/21 1533)     LOS: 11 days      Debbe Odea, MD [facility] Pager: [email or web address] 02/16/2021, 1:25 PM

## 2021-02-17 DIAGNOSIS — R569 Unspecified convulsions: Secondary | ICD-10-CM | POA: Diagnosis not present

## 2021-02-17 LAB — GLUCOSE, CAPILLARY
Glucose-Capillary: 118 mg/dL — ABNORMAL HIGH (ref 70–99)
Glucose-Capillary: 95 mg/dL (ref 70–99)

## 2021-02-17 NOTE — Progress Notes (Signed)
PROGRESS NOTE    Joshua [name]   [id number]  DOB: [birth year]  DOA: 02/04/2021 PCP: Patient, [name]   Brief Narrative:  Joshua [name] is a 40 year old male with a history of intracranial hemorrhage in 08/2019 requiring a hemicraniectomy and evacuation was discharged home in May 2021.  The patient has subsequently had multiple seizures with multiple ER visits for them. He presented on 02/04/2021 due to family's concern that he was having another seizure.  He had a leftward gaze which was noted in the ED as well.  Despite receiving Ativan, it did not improve and therefore he was given an IV Keppra load.   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 -  Has PEG tube and is tolerating tube feeds - also on D 2 diet with thin liquids but is not eating or drinking (declines to) - continue Prostat supplement  - 2/19>  tube feeds noted to be leaking overnight- IR consulted and were able to adjust the bumper to fix the problem.      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   Difficult to place patient Yes   Consultants:   Neurology   Antimicrobials:  Anti-infectives (From admission, onward)   None       Objective: Vitals:   02/16/21 1752 02/16/21 2022 02/16/21 2356 02/17/21 0405  BP: 118/79 134/85 126/84 123/86  Pulse: 99 89 92 86  Resp: '16 17 17 17  '$ Temp: (!) 97.5 F (36.4 C) 97.9 F (36.6 C) 97.6 F (36.4 C) 97.7 F (36.5 C)  TempSrc: Oral Oral Oral Oral  SpO2: 100% 99% 99% 100%  Weight:      Height:        Intake/Output Summary (Last 24 hours) at 02/17/2021 1226 Last data filed at 02/16/2021 1759 Gross per 24 hour  Intake --  Output 700 ml  Net -700 ml   Filed Weights   02/13/21 0343 02/15/21 0500 02/16/21 0500  Weight: 43 kg 44 kg 44.9 kg    Examination: General exam: Appears comfortable  HEENT: PERRLA, oral mucosa moist, no sclera icterus or thrush Respiratory system: Clear to auscultation. Respiratory effort normal. Cardiovascular system: S1 & S2 heard, RRR.   Gastrointestinal system: Abdomen soft, non-tender, PEG tube present- nondistended. Normal bowel sounds. Central nervous system: Alert - nonverbal, does not follow commands or attempt to communicate.  Extremities: No cyanosis, clubbing or edema  Skin: No rashes or ulcers Psychiatry:  Cannot assess    Data Reviewed: I have personally reviewed following labs and imaging studies  CBC: No results for input(s): WBC, NEUTROABS, HGB, HCT, MCV, PLT in the last 168 hours. Basic Metabolic Panel: No results for input(s): NA, K, CL, CO2, GLUCOSE, BUN, CREATININE, CALCIUM, MG, PHOS in the last 168 hours. GFR: Estimated Creatinine Clearance: 46.8 mL/min (A) (by C-G formula based on SCr of 1.32 mg/dL (H)). Liver Function Tests: No results for input(s): AST, ALT, ALKPHOS, BILITOT, PROT, ALBUMIN in the last 168 hours. No results for input(s): LIPASE, AMYLASE in the last 168 hours. No results for input(s): AMMONIA in the last 168  hours. Coagulation Profile: No results for input(s): INR, PROTIME in the last 168 hours. Cardiac Enzymes: No results for input(s): CKTOTAL, CKMB, CKMBINDEX, TROPONINI in the last 168 hours. BNP (last 3 results) No results for input(s): PROBNP in the last 8760 hours. HbA1C: No results for input(s): HGBA1C in the last 72 hours. CBG: Recent Labs  Lab 02/16/21 0849 02/16/21 1720 02/16/21 1924 02/17/21 0002 02/17/21 0403  GLUCAP 131* 118* 106* 95 118*   Lipid Profile: No results for input(s): CHOL, HDL, LDLCALC, TRIG, CHOLHDL, LDLDIRECT in the last 72 hours. Thyroid Function Tests: No results for input(s): TSH, T4TOTAL, FREET4, T3FREE, THYROIDAB in the last 72 hours. Anemia Panel: No results for input(s): VITAMINB12, FOLATE, FERRITIN, TIBC, IRON, RETICCTPCT in the last 72 hours. Urine analysis:    Component Value Date/Time   COLORURINE YELLOW 10/04/2020 1210   APPEARANCEUR HAZY (A) 10/04/2020 1210   LABSPEC 1.012 10/04/2020 1210   PHURINE 8.0 10/04/2020 1210   GLUCOSEU NEGATIVE 10/04/2020 1210   HGBUR NEGATIVE 10/04/2020 1210   BILIRUBINUR NEGATIVE 10/04/2020 1210   KETONESUR NEGATIVE 10/04/2020 1210   PROTEINUR NEGATIVE 10/04/2020 1210   UROBILINOGEN 1.0 04/05/2009 0021   NITRITE NEGATIVE 10/04/2020 1210   LEUKOCYTESUR TRACE (A) 10/04/2020 1210   Sepsis Labs: '@LABRCNTIP'$ (procalcitonin:4,lacticidven:4) ) No results found for this or any previous visit (from the past 240 hour(s)).       Radiology Studies: No results found.    Scheduled Meds: . amLODipine  10 mg Per Tube q AM  . feeding supplement (PROSource TF)  45 mL Per Tube BID  . free water  200 mL Per Tube Q4H  . hydrALAZINE  25 mg Per Tube Q8H  . levETIRAcetam  1,000 mg Per Tube Q12H  . metoprolol tartrate  50 mg Per Tube BID   Continuous Infusions: . feeding supplement (OSMOLITE 1.5 CAL) 1,000 mL (02/16/21 1452)     LOS: 12 days      Debbe Odea, MD [facility] Pager: [email or web address] 02/17/2021, 12:26 PM

## 2021-02-17 NOTE — TOC Progression Note (Signed)
Transition of Care [facility]) - Progression Note    Patient Details  Name: Joshua [name] MRN: [id number] Date of Birth: 02/18/1981  Transition of Care [facility]) CM/SW [place], [place] Phone Number: 02/17/2021, 3:18 PM  Clinical Narrative:   CSW discussed patient's case with CSW Leadership, who provided name of Jorje Guild, Probation officer over [employer] CSW contacted [facility] and left a voicemail asking about any availability for the patient in [place] buildings near the [place] area. Awaiting call back. CSW to follow.    Expected Discharge Plan: Long Term Nursing Home Barriers to Discharge: No SNF bed  Expected Discharge Plan and Services Expected Discharge Plan: [place]     Post Acute Care Choice: Nursing Home Living arrangements for the past 2 months: Single Family Home                                       Social Determinants of Health (SDOH) Interventions    Readmission Risk Interventions Readmission Risk Prevention Plan 07/04/2020  Transportation Screening Complete  PCP or Specialist Appt within 5-7 Days Complete  Home Care Screening Complete  Medication Review (RN CM) Complete  Some recent data might be hidden

## 2021-02-18 DIAGNOSIS — R569 Unspecified convulsions: Secondary | ICD-10-CM | POA: Diagnosis not present

## 2021-02-18 LAB — GLUCOSE, CAPILLARY
Glucose-Capillary: 103 mg/dL — ABNORMAL HIGH (ref 70–99)
Glucose-Capillary: 110 mg/dL — ABNORMAL HIGH (ref 70–99)
Glucose-Capillary: 117 mg/dL — ABNORMAL HIGH (ref 70–99)
Glucose-Capillary: 117 mg/dL — ABNORMAL HIGH (ref 70–99)
Glucose-Capillary: 118 mg/dL — ABNORMAL HIGH (ref 70–99)
Glucose-Capillary: 121 mg/dL — ABNORMAL HIGH (ref 70–99)
Glucose-Capillary: 95 mg/dL (ref 70–99)

## 2021-02-18 NOTE — Progress Notes (Signed)
PROGRESS NOTE    Rahsaan Hippler   [id number]  DOB: 1981/06/25  DOA: 02/04/2021 PCP: Patient, [name]   Brief Narrative:  Cutter Crusan is a 40 year old male with a history of intracranial hemorrhage in 08/2019 requiring a hemicraniectomy and evacuation was discharged home in May 2021.  The patient has subsequently had multiple seizures with multiple ER visits for them. He presented on 02/04/2021 due to family's concern that he was having another seizure.  He had a leftward gaze which was noted in the ED as well.  Despite receiving Ativan, it did not improve and therefore he was given an IV Keppra load.   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 -  Has PEG tube and is tolerating tube feeds - also on D 2 diet with thin liquids but is not eating or drinking (declines to) - continue Prostat supplement  - 2/19>  tube feeds noted to be leaking overnight- IR consulted and were able to adjust the bumper to fix the problem.      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   Difficult to place patient Yes   Consultants:   Neurology   Antimicrobials:  Anti-infectives (From admission, onward)   None       Objective: Vitals:   02/18/21 0349 02/18/21 0500 02/18/21 0737 02/18/21 1148  BP: 125/82  118/78 122/80  Pulse: 87  85 92  Resp: '18  18 18  '$ Temp: 98.6 F (37 C)  98 F (36.7 C)   TempSrc: Oral  Oral   SpO2: 98%  100%   Weight:  44.5 kg    Height:        Intake/Output Summary (Last 24 hours) at 02/18/2021 1306 Last data filed at 02/18/2021 0900 Gross per 24 hour  Intake 0 ml  Output 950 ml  Net -950 ml   Filed Weights   02/15/21 0500 02/16/21 0500 02/18/21 0500  Weight: 44 kg 44.9 kg 44.5 kg    Examination: General exam: Appears comfortable  HEENT: PERRLA, oral mucosa moist, no sclera icterus or thrush Respiratory system: Clear to auscultation. Respiratory effort normal. Cardiovascular system: S1 & S2 heard, RRR.   Gastrointestinal system: Abdomen soft, non-tender, PEG tube present- nondistended. Normal bowel sounds. Central nervous system: Alert - nonverbal, does not follow commands or attempt to communicate.  Extremities: No cyanosis, clubbing or edema Skin: No rashes or ulcers  Psychiatry:  Cannot assess    Data Reviewed: I have personally reviewed following labs and imaging studies  CBC: No results for input(s): WBC, NEUTROABS, HGB, HCT, MCV, PLT in the last 168 hours. Basic Metabolic Panel: No results for input(s): NA, K, CL, CO2, GLUCOSE, BUN, CREATININE, CALCIUM, MG, PHOS in the last 168 hours. GFR: Estimated Creatinine Clearance: 46.4 mL/min (A) (by C-G formula based on SCr of 1.32 mg/dL (H)). Liver Function Tests: No results for input(s): AST, ALT, ALKPHOS, BILITOT, PROT, ALBUMIN in the last 168 hours. No results for input(s): LIPASE, AMYLASE in the last 168 hours. No results for input(s): AMMONIA in the last 168 hours. Coagulation Profile: No results for input(s): INR,  PROTIME in the last 168 hours. Cardiac Enzymes: No results for input(s): CKTOTAL, CKMB, CKMBINDEX, TROPONINI in the last 168 hours. BNP (last 3 results) No results for input(s): PROBNP in the last 8760 hours. HbA1C: No results for input(s): HGBA1C in the last 72 hours. CBG: Recent Labs  Lab 02/17/21 0403 02/18/21 0001 02/18/21 0346 02/18/21 0731 02/18/21 1150  GLUCAP 118* 117* 103* 95 121*   Lipid Profile: No results for input(s): CHOL, HDL, LDLCALC, TRIG, CHOLHDL, LDLDIRECT in the last 72 hours. Thyroid Function Tests: No results for input(s): TSH, T4TOTAL, FREET4, T3FREE, THYROIDAB in the last 72 hours. Anemia Panel: No results for input(s): VITAMINB12, FOLATE, FERRITIN, TIBC, IRON, RETICCTPCT in the last 72 hours. Urine analysis:    Component Value Date/Time   COLORURINE YELLOW 10/04/2020 1210   APPEARANCEUR HAZY (A) 10/04/2020 1210   LABSPEC 1.012 10/04/2020 1210   PHURINE 8.0 10/04/2020 1210   GLUCOSEU NEGATIVE 10/04/2020 1210   HGBUR NEGATIVE 10/04/2020 1210   BILIRUBINUR NEGATIVE 10/04/2020 1210   KETONESUR NEGATIVE 10/04/2020 1210   PROTEINUR NEGATIVE 10/04/2020 1210   UROBILINOGEN 1.0 04/05/2009 0021   NITRITE NEGATIVE 10/04/2020 1210   LEUKOCYTESUR TRACE (A) 10/04/2020 1210   Sepsis Labs: '@LABRCNTIP'$ (procalcitonin:4,lacticidven:4) ) No results found for this or any previous visit (from the past 240 hour(s)).       Radiology Studies: No results found.    Scheduled Meds:  amLODipine  10 mg Per Tube q AM   feeding supplement (PROSource TF)  45 mL Per Tube BID   free water  200 mL Per Tube Q4H   hydrALAZINE  25 mg Per Tube Q8H   levETIRAcetam  1,000 mg Per Tube Q12H   metoprolol tartrate  50 mg Per Tube BID   Continuous Infusions:  feeding supplement (OSMOLITE 1.5 CAL) 1,000 mL (02/16/21 1452)     LOS: 13 days      Debbe Odea, MD [facility] Pager: [email or web address] 02/18/2021, 1:06 PM

## 2021-02-19 DIAGNOSIS — R569 Unspecified convulsions: Secondary | ICD-10-CM | POA: Diagnosis not present

## 2021-02-19 LAB — GLUCOSE, CAPILLARY
Glucose-Capillary: 104 mg/dL — ABNORMAL HIGH (ref 70–99)
Glucose-Capillary: 105 mg/dL — ABNORMAL HIGH (ref 70–99)
Glucose-Capillary: 108 mg/dL — ABNORMAL HIGH (ref 70–99)
Glucose-Capillary: 124 mg/dL — ABNORMAL HIGH (ref 70–99)
Glucose-Capillary: 125 mg/dL — ABNORMAL HIGH (ref 70–99)

## 2021-02-19 NOTE — Progress Notes (Signed)
PROGRESS NOTE    Joshua [name]   [id number]  DOB: April 14, 1981  DOA: 02/04/2021 PCP: Patient, [name]   Brief Narrative:  Joshua [name] is a 40 year old male with a history of intracranial hemorrhage in 08/2019 requiring a hemicraniectomy and evacuation was discharged home in May 2021.  The patient has subsequently had multiple seizures with multiple ER visits for them. He presented on 02/04/2021 due to family's concern that he was having another seizure.  He had a leftward gaze which was noted in the ED as well.  Despite receiving Ativan, it did not improve and therefore he was given an IV Keppra load.   Subjective: He is nonverbal- his condition is unchanged.    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 -  He has PEG tube and is tolerating tube feeds - also on D 2 diet with thin liquids but is not eating or drinking (declines to) - continue Prostat supplement  - 2/19>  tube feeds noted to be leaking overnight- IR consulted and were able to adjust the bumper to fix the problem.  - I have been speaking with nursing about making sure the abdominal binder is covering his PEG to protect it.    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   Difficult to place patient Yes   Consultants:   Neurology   Antimicrobials:  Anti-infectives (From admission, onward)   None       Objective: Vitals:   02/18/21 2129 02/18/21 2328 02/19/21 0324 02/19/21 0751  BP: 125/81 133/87 131/83 (!) 151/97  Pulse: 80 77 78 86  Resp:  '16 16 16  '$ Temp:  98.2 F (36.8 C) 98.1 F (36.7 C) 98 F (36.7 C)  TempSrc:  Oral Oral Axillary  SpO2:  99% 99% 100%  Weight:   43.8 kg   Height:        Intake/Output Summary (Last 24 hours) at 02/19/2021 0954 Last data filed at 02/19/2021 0943 Gross per 24 hour  Intake 110 ml  Output 2000 ml  Net -1890 ml   Filed Weights   02/16/21 0500 02/18/21 0500 02/19/21 0324  Weight: 44.9 kg 44.5 kg 43.8 kg    Examination: General exam: Appears comfortable  HEENT: PERRLA, oral mucosa moist, no sclera icterus or thrush Respiratory system: Clear to auscultation. Respiratory effort normal. Cardiovascular system: S1 & S2 heard, RRR.   Gastrointestinal system: Abdomen soft, non-tender, PEG tube present- nondistended.  Normal bowel sounds. Central nervous system: Alert - nonverbal, does not follow commands or attempt to communicate.  Extremities: No cyanosis, clubbing or edema Skin: No rashes or ulcers Psychiatry:  Cannot assess    Data Reviewed: I have personally reviewed following labs and imaging studies  CBC: No results for input(s): WBC, NEUTROABS, HGB, HCT, MCV, PLT in the last 168 hours. Basic Metabolic Panel: No results for input(s): NA, K, CL, CO2, GLUCOSE, BUN, CREATININE, CALCIUM, MG, PHOS in the last 168 hours. GFR: Estimated Creatinine Clearance: 45.6 mL/min (A) (by C-G formula based on SCr of 1.32 mg/dL (H)). Liver Function Tests: No results for input(s): AST, ALT, ALKPHOS, BILITOT, PROT, ALBUMIN in  the last 168 hours. No results for input(s): LIPASE, AMYLASE in the last 168 hours. No results for input(s): AMMONIA in the last 168 hours. Coagulation Profile: No results for input(s): INR, PROTIME in the last 168 hours. Cardiac Enzymes: No results for input(s): CKTOTAL, CKMB, CKMBINDEX, TROPONINI in the last 168 hours. BNP (last 3 results) No results for input(s): PROBNP in the last 8760 hours. HbA1C: No results for input(s): HGBA1C in the last 72 hours. CBG: Recent Labs  Lab 02/18/21 1534 02/18/21 1958 02/18/21 2329 02/19/21 0326 02/19/21 0752  GLUCAP 110* 117* 118* 108* 125*   Lipid Profile: No results for input(s): CHOL, HDL, LDLCALC, TRIG, CHOLHDL, LDLDIRECT in the last 72 hours. Thyroid Function Tests: No results for input(s): TSH, T4TOTAL, FREET4, T3FREE, THYROIDAB in the last 72 hours. Anemia Panel: No results for input(s): VITAMINB12, FOLATE, FERRITIN, TIBC, IRON, RETICCTPCT in the last 72 hours. Urine analysis:    Component Value Date/Time   COLORURINE YELLOW 10/04/2020 1210   APPEARANCEUR HAZY (A) 10/04/2020 1210   LABSPEC 1.012 10/04/2020 1210   PHURINE 8.0 10/04/2020 1210   GLUCOSEU NEGATIVE 10/04/2020 1210   HGBUR NEGATIVE 10/04/2020 1210   BILIRUBINUR NEGATIVE 10/04/2020 1210   KETONESUR NEGATIVE 10/04/2020 1210   PROTEINUR NEGATIVE 10/04/2020 1210   UROBILINOGEN 1.0 04/05/2009 0021   NITRITE NEGATIVE 10/04/2020 1210   LEUKOCYTESUR TRACE (A) 10/04/2020 1210   Sepsis Labs: '@LABRCNTIP'$ (procalcitonin:4,lacticidven:4) ) No results found for this or any previous visit (from the past 240 hour(s)).       Radiology Studies: No results found.    Scheduled Meds: . amLODipine  10 mg Per Tube q AM  . feeding supplement (PROSource TF)  45 mL Per Tube BID  . free water  200 mL Per Tube Q4H  . hydrALAZINE  25 mg Per Tube Q8H  . levETIRAcetam  1,000 mg Per Tube Q12H  . metoprolol tartrate  50 mg Per Tube BID   Continuous Infusions: . feeding supplement  (OSMOLITE 1.5 CAL) 1,000 mL (02/18/21 2339)     LOS: 14 days      Debbe Odea, MD [facility] Pager: [email or web address] 02/19/2021, 9:54 AM

## 2021-02-20 DIAGNOSIS — R569 Unspecified convulsions: Secondary | ICD-10-CM | POA: Diagnosis not present

## 2021-02-20 LAB — GLUCOSE, CAPILLARY
Glucose-Capillary: 129 mg/dL — ABNORMAL HIGH (ref 70–99)
Glucose-Capillary: 127 mg/dL — ABNORMAL HIGH (ref 70–99)
Glucose-Capillary: 102 mg/dL — ABNORMAL HIGH (ref 70–99)
Glucose-Capillary: 113 mg/dL — ABNORMAL HIGH (ref 70–99)
Glucose-Capillary: 106 mg/dL — ABNORMAL HIGH (ref 70–99)

## 2021-02-20 NOTE — Progress Notes (Signed)
PROGRESS NOTE    Joshua [name]   [id number]  DOB: 1981-11-05  DOA: 02/04/2021 PCP: Patient, [name]   Brief Narrative:  Joshua [name] is a 40 year old male with a history of intracranial hemorrhage in 08/2019 requiring a hemicraniectomy and evacuation was discharged home in May 2021.  The patient has subsequently had multiple seizures with multiple ER visits for them. He presented on 02/04/2021 due to family's concern that he was having another seizure.  He had a leftward gaze which was noted in the ED as well.  Despite receiving Ativan, it did not improve and therefore he was given an IV Keppra load.   Subjective: He is nonverbal- his condition is unchanged.    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 -  He has PEG tube and is tolerating tube feeds - also on D 2 diet with thin liquids but is not eating or drinking (declines to) - continue Prostat supplement  - 2/19>  tube feeds noted to be leaking overnight- IR consulted and were able to adjust the bumper to fix the problem.  - I have been speaking with nursing about making sure the abdominal binder is covering his PEG to protect it.    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   Difficult to place patient Yes   Consultants:   Neurology   Antimicrobials:  Anti-infectives (From admission, onward)   None       Objective: Vitals:   02/19/21 2351 02/20/21 0340 02/20/21 0823 02/20/21 0826  BP: (!) 127/102 (!) 143/100 137/82 137/82  Pulse: 80 81  94  Resp: '18 17  18  '$ Temp: (!) 97.5 F (36.4 C) 97.9 F (36.6 C)  97.9 F (36.6 C)  TempSrc: Oral   Oral  SpO2: 100% 100%  99%  Weight:      Height:        Intake/Output Summary (Last 24 hours) at 02/20/2021 1231 Last data filed at 02/20/2021 0950 Gross per 24 hour  Intake 6333 ml  Output 650 ml  Net 5683 ml   Filed Weights   02/16/21 0500 02/18/21 0500 02/19/21 0324  Weight: 44.9 kg 44.5 kg 43.8 kg    Examination: General exam: Appears comfortable  HEENT: PERRLA, oral mucosa moist, no sclera icterus or thrush Respiratory system: Clear to auscultation. Respiratory effort normal. Cardiovascular system: S1 & S2 heard, RRR.   Gastrointestinal system: Abdomen soft, non-tender, PEG tube present-  nondistended. Normal bowel sounds. Central nervous system: Alert - nonverbal, does not follow commands or attempt to communicate.  Extremities: No cyanosis, clubbing or edema Skin: No rashes or ulcers Psychiatry:  Cannot assess    Data Reviewed: I have personally reviewed following labs and imaging studies  CBC: No results for input(s): WBC, NEUTROABS, HGB, HCT, MCV, PLT in the last 168 hours. Basic Metabolic Panel: No results for input(s): NA, K, CL, CO2, GLUCOSE, BUN, CREATININE, CALCIUM, MG, PHOS in the last 168 hours. GFR: Estimated Creatinine Clearance: 45.6 mL/min (A) (by C-G formula based on SCr of 1.32 mg/dL (H)). Liver Function Tests: No results for input(s): AST, ALT, ALKPHOS, BILITOT, PROT, ALBUMIN in the  last 168 hours. No results for input(s): LIPASE, AMYLASE in the last 168 hours. No results for input(s): AMMONIA in the last 168 hours. Coagulation Profile: No results for input(s): INR, PROTIME in the last 168 hours. Cardiac Enzymes: No results for input(s): CKTOTAL, CKMB, CKMBINDEX, TROPONINI in the last 168 hours. BNP (last 3 results) No results for input(s): PROBNP in the last 8760 hours. HbA1C: No results for input(s): HGBA1C in the last 72 hours. CBG: Recent Labs  Lab 02/19/21 1559 02/19/21 2130 02/19/21 2348 02/20/21 0340 02/20/21 0822  GLUCAP 124* 104* 105* 129* 127*   Lipid Profile: No results for input(s): CHOL, HDL, LDLCALC, TRIG, CHOLHDL, LDLDIRECT in the last 72 hours. Thyroid Function Tests: No results for input(s): TSH, T4TOTAL, FREET4, T3FREE, THYROIDAB in the last 72 hours. Anemia Panel: No results for input(s): VITAMINB12, FOLATE, FERRITIN, TIBC, IRON, RETICCTPCT in the last 72 hours. Urine analysis:    Component Value Date/Time   COLORURINE YELLOW 10/04/2020 1210   APPEARANCEUR HAZY (A) 10/04/2020 1210   LABSPEC 1.012 10/04/2020 1210   PHURINE 8.0 10/04/2020 1210   GLUCOSEU NEGATIVE 10/04/2020 1210   HGBUR NEGATIVE 10/04/2020 1210   BILIRUBINUR NEGATIVE 10/04/2020 1210   KETONESUR NEGATIVE 10/04/2020 1210   PROTEINUR NEGATIVE 10/04/2020 1210   UROBILINOGEN 1.0 04/05/2009 0021   NITRITE NEGATIVE 10/04/2020 1210   LEUKOCYTESUR TRACE (A) 10/04/2020 1210   Sepsis Labs: '@LABRCNTIP'$ (procalcitonin:4,lacticidven:4) ) No results found for this or any previous visit (from the past 240 hour(s)).       Radiology Studies: No results found.    Scheduled Meds: . amLODipine  10 mg Per Tube q AM  . feeding supplement (PROSource TF)  45 mL Per Tube BID  . free water  200 mL Per Tube Q4H  . hydrALAZINE  25 mg Per Tube Q8H  . levETIRAcetam  1,000 mg Per Tube Q12H  . metoprolol tartrate  50 mg Per Tube BID   Continuous Infusions: . feeding supplement  (OSMOLITE 1.5 CAL) 60 mL/hr at 02/19/21 2000     LOS: 15 days      Debbe Odea, MD [facility] Pager: [email or web address] 02/20/2021, 12:31 PM

## 2021-02-20 NOTE — Progress Notes (Addendum)
Nutrition Follow-up  RD working remotely.  DOCUMENTATION CODES:   Underweight  INTERVENTION:   Continue tube feeding via PEG: - Osmolite 1.5 @ 60 ml/hr (1440 ml/day) - ProSource TF 45 ml BID - Free water flushes of 200 ml q 4 hours  Tube feeding regimen provides 2240 kcal, 112 grams of protein, and 1097 ml of H2O.   Total free water with flushes: 2297 ml  NUTRITION DIAGNOSIS:   Inadequate oral intake related to dysphagia as evidenced by meal completion < 25%.  Ongoing  GOAL:   Patient will meet greater than or equal to 90% of their needs  Met via TF  MONITOR:   TF tolerance,Labs,Weight trends,I & O's  REASON FOR ASSESSMENT:   Consult Assessment of nutrition requirement/status,Enteral/tube feeding initiation and management  ASSESSMENT:   Pt admitted with seizure in the setting of large L ICH s/p craniotomy, recurrent seizures. PMH includes large ICH (08/2019) requiring hemicraniectomy and evacuation followed by extensive hospitalization; pt discharged in May 2021. Pt has since experienced the development of seizures resulting in multiple ER visits/hospital admissions for concern for seizures/seizure-like activity  Pt pending d/c to SNF. Condition unchanged per MD. Pt nonverbal.   Pt remains on dysphagia 2 diet with thin liquids but continues to refuse meals. Pt requiring tube feeds to meet 100% of nutritional needs. Tolerating TF per RN. Current TF: Osmolite 1.5 @ 60 ml/hr, ProSource TF 45 ml daily, free water flushes 200 ml q 4 hours  Pt with a weight loss of 22.8 kg since 10/04/20. This is a 35% weight loss in less than 5 months which is severe and significant for timeframe. Pt likely meets criteria for severe malnutrition. Need to confirm with [facility].  Medications reviewed. Labs: CBGs [phone]  UOP: 1950 ml x 24 hours  NUTRITION - FOCUSED PHYSICAL EXAM:  Unable to complete at this time. RD working remotely.  Diet Order:   Diet Order            DIET DYS  2 Room service appropriate? Yes; Fluid consistency: Thin  Diet effective now                 EDUCATION NEEDS:   No education needs have been identified at this time  Skin:  Skin Assessment: Reviewed RN Assessment  Last BM:  2/23 type 6  Height:   Ht Readings from Last 1 Encounters:  02/06/21 6' (1.829 m)    Weight:   Wt Readings from Last 1 Encounters:  02/19/21 43.8 kg    BMI:  Body mass index is 13.1 kg/m.  Estimated Nutritional Needs:   Kcal:  2000-2200  Protein:  100-115 grams  Fluid:  >2L   Larkin Ina, MS, RD, LDN RD pager number and weekend/on-call pager number located in [place].

## 2021-02-21 DIAGNOSIS — G9349 Other encephalopathy: Secondary | ICD-10-CM | POA: Diagnosis present

## 2021-02-21 DIAGNOSIS — R569 Unspecified convulsions: Secondary | ICD-10-CM | POA: Diagnosis not present

## 2021-02-21 LAB — GLUCOSE, CAPILLARY
Glucose-Capillary: 105 mg/dL — ABNORMAL HIGH (ref 70–99)
Glucose-Capillary: 110 mg/dL — ABNORMAL HIGH (ref 70–99)
Glucose-Capillary: 121 mg/dL — ABNORMAL HIGH (ref 70–99)
Glucose-Capillary: 129 mg/dL — ABNORMAL HIGH (ref 70–99)
Glucose-Capillary: 87 mg/dL (ref 70–99)
Glucose-Capillary: 94 mg/dL (ref 70–99)

## 2021-02-21 MED ORDER — QUETIAPINE FUMARATE 25 MG PO TABS
25.0000 mg | ORAL_TABLET | Freq: Two times a day (BID) | ORAL | Status: DC
  Administered 2021-02-21 – 2021-02-22 (×4): 25 mg via ORAL
  Filled 2021-02-21 (×4): qty 1

## 2021-02-21 NOTE — Progress Notes (Signed)
[facility] PROGRESS NOTE  Joshua [name] [id number] DOB: 03-Feb-1981 DOA: 02/04/2021 PCP: Patient, [name]  Status: Remains inpatient appropriate because:Altered mental status, Unsafe d/c plan and Inpatient level of care appropriate due to severity of illness   Dispo: The patient is from: Home              Anticipated d/c is to: SNF              Patient currently is medically stable to d/c.   Difficult to place patient Yes-needs LT SNF-has Medicaid appears to be active in [place] but and active in [place]/family would like to place him in [facility] if possible; [place] requiring safety mitts. Patient becomes extremely agitated with attempts to make physical contact with her to perform auscultation of chest and heart or to administer meds through PEG tube. Plans are to initiate pharmacotherapy to see if this improves patient's behavioral response.   Level of care: Telemetry Medical  Code Status: Full Family Communication:  DVT prophylaxis: SCDs Vaccination status: Unknown  Foley catheter: No  HPI: 40 year old male with a history of intracranial hemorrhage in 08/2019 requiring a hemicraniectomy and evacuation was discharged home in May 2021.  The patient has subsequently had multiple seizures with multiple ER visits for them. He presented on 02/04/2021 due to family's concern that he was having another seizure.  He had a leftward gaze which was noted in the ED as well.  Despite receiving Ativan, it did not improve and therefore he was given an IV Keppra load.   Subjective: Awakened, nonverbal, calm until attempts made to touch patient or examine patient and he becomes very agitated swatting this examiner's hand away. Eyes are wide and he appears to be frightened and or very angry.  Objective: Vitals:   02/21/21 0546 02/21/21 0700  BP: 136/85 (!) 134/101  Pulse: 80 97  Resp:  16  Temp:    SpO2:  100%    Intake/Output Summary (Last 24 hours) at 02/21/2021  1242 Last data filed at 02/21/2021 0434 Gross per 24 hour  Intake 1300 ml  Output 600 ml  Net 700 ml   Filed Weights   02/18/21 0500 02/19/21 0324 02/21/21 0521  Weight: 44.5 kg 43.8 kg 45.8 kg    Exam:  Constitutional: NAD, calm, comfortable until stimulated and becomes agitated Respiratory: Lungs are clear based on limited exam. Stable on room air Cardiovascular: Skin warm and dry, heart tones appear to be normal on limited exam and pulses regular Abdomen: Unable to assess given patient pushing examiner's hand away despite nursing stating at bedside assisting. Patient does have a PEG tube in place with continuous tube feedings infusing.  Musculoskeletal: no clubbing / cyanosis. 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 Unable to adequately assess tone but has normal tone and left upper extremity which he uses to push staff away when he is stimulated physical Neurologic: Cranial nerves II through XII appear to be grossly intact but patient unable to participate with formal exam. 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 Able to use left arm purposefully and uses this to push staff away and swat at staff. Patient not cooperative enough to determine what type of strength/activity regarding left leg. Psychiatric: Awakens, nonverbal, unable to adequately assess orientation.    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 -  He has PEG tube and is tolerating tube feeds - also on D 2 diet with thin liquids but is not eating or drinking (declines to) - continue Prostat supplement  - I have been speaking with nursing about making sure the abdominal binder is covering his PEG to protect it. Nutrition Problem: Inadequate oral intake Etiology: dysphagia Signs/Symptoms: meal completion < 25% Interventions: Prostat,Tube feeding    Other problems: CKD 3 - Creatinine stable   Data Reviewed: Basic Metabolic Panel: No results for input(s): NA, K, CL, CO2, GLUCOSE, BUN, CREATININE, CALCIUM, MG, PHOS in the last 168 hours. Liver Function Tests: No results for input(s): AST, ALT, ALKPHOS, BILITOT, PROT, ALBUMIN in the last 168 hours. No results for input(s): LIPASE, AMYLASE in the last 168 hours. No results for input(s): AMMONIA in the last 168 hours. CBC: No results for input(s): WBC, NEUTROABS, HGB, HCT, MCV, PLT in the last 168 hours. Cardiac Enzymes: No results for input(s): CKTOTAL, CKMB, CKMBINDEX, TROPONINI in the last 168 hours. BNP (last 3 results) No results for input(s): BNP in the last 8760 hours.  ProBNP (last 3 results) No results for input(s): PROBNP in the last 8760 hours.  CBG: Recent Labs  Lab 02/20/21 1618 02/20/21 2024 02/21/21 0006 02/21/21 0432 02/21/21 0752  GLUCAP 113* 106* 105* 110* 129*    No results found for this or any previous visit (from the past 240 hour(s)).   Studies: No results found.  Scheduled Meds: . amLODipine  10 mg Per Tube q AM  . feeding supplement (PROSource TF)  45 mL Per Tube BID  . free water  200 mL Per Tube Q4H  . hydrALAZINE  25 mg Per Tube Q8H  . levETIRAcetam  1,000 mg Per Tube Q12H  . metoprolol tartrate  50 mg Per Tube BID  . QUEtiapine  25 mg Oral BID   Continuous Infusions: . 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

## 2021-02-21 NOTE — Plan of Care (Signed)

## 2021-02-21 NOTE — Progress Notes (Signed)
CSW spoke with Kathlee Nations, [place] who states this patient was previously at [facility] last year for a few months.   CSW spoke with Rolla Plate at [facility] [place] - she is agreeable to review referral. [facility] faxed clinicals via [place] for review.  [name], MSW, LCSW Transitions of Care  Clinical Social Worker II [phone]

## 2021-02-22 LAB — GLUCOSE, CAPILLARY
Glucose-Capillary: 116 mg/dL — ABNORMAL HIGH (ref 70–99)
Glucose-Capillary: 116 mg/dL — ABNORMAL HIGH (ref 70–99)
Glucose-Capillary: 117 mg/dL — ABNORMAL HIGH (ref 70–99)
Glucose-Capillary: 121 mg/dL — ABNORMAL HIGH (ref 70–99)
Glucose-Capillary: 93 mg/dL (ref 70–99)
Glucose-Capillary: 94 mg/dL (ref 70–99)

## 2021-02-22 MED ORDER — QUETIAPINE FUMARATE 25 MG PO TABS
25.0000 mg | ORAL_TABLET | Freq: Two times a day (BID) | ORAL | Status: DC
  Administered 2021-02-23 (×2): 25 mg
  Filled 2021-02-22 (×2): qty 1

## 2021-02-22 NOTE — Progress Notes (Signed)
Patient on the LLS team.  No changes from prior day.  Refusing to eat so tube feedings continued.  Still becomes anxious/agitated with physical exam.   Eulogio Bear DO

## 2021-02-23 LAB — GLUCOSE, CAPILLARY
Glucose-Capillary: 100 mg/dL — ABNORMAL HIGH (ref 70–99)
Glucose-Capillary: 102 mg/dL — ABNORMAL HIGH (ref 70–99)
Glucose-Capillary: 103 mg/dL — ABNORMAL HIGH (ref 70–99)
Glucose-Capillary: 104 mg/dL — ABNORMAL HIGH (ref 70–99)
Glucose-Capillary: 125 mg/dL — ABNORMAL HIGH (ref 70–99)
Glucose-Capillary: 125 mg/dL — ABNORMAL HIGH (ref 70–99)

## 2021-02-23 NOTE — Progress Notes (Signed)
Patient on the LLS team.  Resting with eyse closed.  Wearing mittens as he was pulling at his lines.  Refusing to eat so tube feedings continued.   Joshua Bear DO

## 2021-02-24 DIAGNOSIS — R569 Unspecified convulsions: Secondary | ICD-10-CM | POA: Diagnosis not present

## 2021-02-24 LAB — GLUCOSE, CAPILLARY
Glucose-Capillary: 115 mg/dL — ABNORMAL HIGH (ref 70–99)
Glucose-Capillary: 101 mg/dL — ABNORMAL HIGH (ref 70–99)
Glucose-Capillary: 94 mg/dL (ref 70–99)
Glucose-Capillary: 93 mg/dL (ref 70–99)

## 2021-02-24 MED ORDER — COVID-19 AD26 VACCINE(JANSSEN) 0.5 ML IM SUSP
0.5000 mL | Freq: Once | INTRAMUSCULAR | Status: AC
  Administered 2021-02-25: 0.5 mL via INTRAMUSCULAR
  Filled 2021-02-24 (×2): qty 0.5

## 2021-02-24 MED ORDER — QUETIAPINE FUMARATE 50 MG PO TABS
50.0000 mg | ORAL_TABLET | Freq: Every day | ORAL | Status: DC
  Administered 2021-02-24 – 2021-03-06 (×11): 50 mg
  Filled 2021-02-24 (×12): qty 1

## 2021-02-24 MED ORDER — QUETIAPINE FUMARATE 25 MG PO TABS
25.0000 mg | ORAL_TABLET | Freq: Every day | ORAL | Status: DC
  Administered 2021-02-24 – 2021-03-07 (×12): 25 mg
  Filled 2021-02-24 (×12): qty 1

## 2021-02-24 NOTE — Progress Notes (Addendum)
[facility] PROGRESS NOTE  Joshua [name] [id number] DOB: 15-Jun-1981 DOA: 02/04/2021 PCP: Patient, [name]  Status: Remains inpatient appropriate because:Altered mental status, Unsafe d/c plan and Inpatient level of care appropriate due to severity of illness   Dispo: The patient is from: Home              Anticipated d/c is to: SNF              Patient currently is medically stable to d/c.   Difficult to place patient Yes-needs LT SNF-has Medicaid appears to be active in [place] but and active in [place]/family would like to place him in [place] if possible; Pharmacotherapy has improved patient's behavioral response in context of acute anoxic brain injury.  2/28 [facility] in [facility] called-they have beds and will review his clinicals   Level of care: Telemetry Medical  Code Status: Full Family Communication: Mother on 2/28-gave name of facility in [facility]) that her daughter had been reviewing. Aware if no bed offers would need to dc home with family. TOC will arrange [place] and DME if determined needs to dc home. Care issues at home influenced by pt behaviors and family acutely physical unable to care for pt due to recent surgeries. DVT prophylaxis: SCDs Vaccination status: 2/28 Spoke with his mother who agreed to Covid vaccine-order placed for [place] vaccine  Foley catheter: No  HPI: 40 year old male with a history of intracranial hemorrhage in 08/2019 requiring a hemicraniectomy and evacuation was discharged home in May 2021.  The patient has subsequently had multiple seizures with multiple ER visits for them. He presented on 02/04/2021 due to family's concern that he was having another seizure.  He had a leftward gaze which was noted in the ED as well.  Despite receiving Ativan, it did not improve and therefore he was given an IV Keppra load.   Subjective: Awakened.  Eyes not as "mild" is on Friday/since initiation of Seroquel.  Nonverbal at baseline  secondary to previous brain injury aphasia.  More calm and allowed me to perform physical examination.  Blanket was pulled down and patient did not push my hand away but instead reach for the blanket pulled up over his arms since he appeared to be cold.  Objective: Vitals:   02/24/21 0658 02/24/21 0743  BP: (!) 122/103 129/86  Pulse:  89  Resp:  18  Temp:  97.9 F (36.6 C)  SpO2:  100%    Intake/Output Summary (Last 24 hours) at 02/24/2021 0745 Last data filed at 02/23/2021 1718 Gross per 24 hour  Intake 1830 ml  Output 500 ml  Net 1330 ml   Filed Weights   02/18/21 0500 02/19/21 0324 02/21/21 0521  Weight: 44.5 kg 43.8 kg 45.8 kg    Exam:  Constitutional: Alert, calm, no acute distress Respiratory: Lungs are clear to auscultation anteriorly, stable on room air Cardiovascular: Normal heart sounds, pulses regular, no peripheral edema Abdomen:  PEG tube in place with continuous tube feedings infusing.  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 Unable to adequately assess tone but has normal tone and left upper extremity which he uses to push staff away when he is stimulated physical Neurologic: Cranial nerves II through XII appear to be grossly intact  Psychiatric: Awake, much more calm after initiation of Seroquel, no combative behaviors while I was at bedside.  Aphasic so unable to really assess orientation   Assessment/Plan:  Acute problems: Seizure with new chronic encephalopathy/suspected anoxic brain injury -Likely secondary to prior brain injury related to intracranial hemorrhage -Prior to recent seizures and resultant brain injury he had been able to eat orally -EEG ordered but then canceled as patient was noncooperative -Continue Keppra 1 g twice daily- Neurology has signed off  -Added Seroquel 25 mg per tube twice daily on 2/25 with improvement in behaviors although nursing staff still says  he becomes combative at times.  Will increase nocturnal dose to 50 mg.  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 -  He has PEG tube and is tolerating tube feeds - also on D 2 diet with thin liquids but is not eating or drinking (declines to) - continue Prostat supplement  - I have been speaking with nursing about making sure the abdominal binder is covering his PEG to protect it. Nutrition Problem: Inadequate oral intake Etiology: dysphagia Signs/Symptoms: meal completion < 25% Interventions: Prostat,Tube feeding    Other problems: CKD 3 - Creatinine stable   Data Reviewed: Basic Metabolic Panel: No results for input(s): NA, K, CL, CO2, GLUCOSE, BUN, CREATININE, CALCIUM, MG, PHOS in the last 168 hours. Liver Function Tests: No results for input(s): AST, ALT, ALKPHOS, BILITOT, PROT, ALBUMIN in the last 168 hours. No results for input(s): LIPASE, AMYLASE in the last 168 hours. No results for input(s): AMMONIA in the last 168 hours. CBC: No results for input(s): WBC, NEUTROABS, HGB, HCT, MCV, PLT in the last 168 hours. Cardiac Enzymes: No results for input(s): CKTOTAL, CKMB, CKMBINDEX, TROPONINI in the last 168 hours. BNP (last 3 results) No results for input(s): BNP in the last 8760 hours.  ProBNP (last 3 results) No results for input(s): PROBNP in the last 8760 hours.  CBG: Recent Labs  Lab 02/23/21 1517 02/23/21 1959 02/23/21 2357 02/24/21 0412 02/24/21 0740  GLUCAP 100* 103* 104* 115* 101*    No results found for this or any previous visit (from the past 240 hour(s)).   Studies: No results found.  Scheduled Meds: . amLODipine  10 mg Per Tube q AM  . feeding supplement (PROSource TF)  45 mL Per Tube BID  . free water  200 mL Per  Tube Q4H  . hydrALAZINE  25 mg Per Tube Q8H  . levETIRAcetam  1,000 mg Per Tube Q12H  . metoprolol tartrate  50 mg Per Tube BID  . QUEtiapine  25 mg Per Tube BID   Continuous Infusions: . 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

## 2021-02-24 NOTE — TOC Progression Note (Signed)
Transition of Care [facility]) - Progression Note    Patient Details  Name: Joshua [name] MRN: [id number] Date of Birth: 1981-03-20  Transition of Care [facility]) CM/SW [place], RN Phone Number: 02/24/2021, 2:38 PM  Clinical Narrative:    Case management met at the bedside regarding transitions of care.  The patient was calm this morning when approached for physical assessment and patient was willing to let the NP listen to the patient's lungs and abdomen without pushing the caregiver away during the assessment.  The patient's abdominal bind is in place over the PEG tube to protect the patient from pulling out the tube.  The patient is wearing neuro mitts at this time to protect tubes and lines.  I called and spoke with the patient's mother, Joshua [name] on the phone and the family was asking about SNF placement at [facility] facility in [place], [place].  Joshua [name], MSW is following up at the facility - see note.  I spoke with the patient's mother in depth about transferring the patient home with home health services if unable to establish placement at [place] facility and the family was in agreement.  The mother, Joshua [name] was concerned about caring for the patient at home since she is recovering from a shoulder surgery.  The patient was taken out of [facility] by family and an aunt was caring for him at her home.  The patient at this time was taking po diet and not tube feedings.  The aunt was unable to care for the patient at home at this time since she, also was recovery from recent "leg surgery".  The patient's mother states that if [facility] is unable to find SNF placement at the [facility] facility than she is willing to take him home with home health services to her home in [place], [place], [place] [address].  The patient's family bought a hospital bed but will need a hoyer lift, tube feeding pump and WC upon discharge if this becomes the discharge plan. CM plans to reach out to  home health companies in the [place] area in the meantime as back up plan to SNF placement.  I spoke with the patient's mother on the phone and she states that the patient did not receive a COVID vaccine but she gives permission for a J&J vaccine for COVID at this time to aid in acceptance at a SNF facility.  CM and MSW will continue to follow the patient for discharge planning - through Long Length of Stay Team for [facility].  02/25/2021 1530 - CM spoke with Joshua [name], [facility] [phone], main - [phone])  in [facility] area and she accepted the patient for home health services to include RN and aide if patient is discharged home with family versus placement at [facility].  Home Health orders for these services will be placed along with dme once SNF placement is determined.       Expected Discharge Plan: [place] Barriers to Discharge: Continued Medical Work up,SNF Pending bed offer  Expected Discharge Plan and Services Expected Discharge Plan: [place] In-house Referral: Clinical Social Work Discharge Planning Services: CM Consult Post Acute Care Choice: [place] Living arrangements for the past 2 months: [place]  Social Determinants of Health (SDOH) Interventions    Readmission Risk Interventions Readmission Risk Prevention Plan 02/24/2021 07/04/2020  Transportation Screening Complete Complete  PCP or Specialist Appt within 5-7 Days - Complete  PCP or Specialist Appt within 3-5 Days Complete -  Home Care Screening - Complete  Medication Review (RN CM) - Complete  HRI or Home Care Consult Complete -  Social Work Consult for Recovery Care Planning/Counseling Complete -  Palliative Care Screening Complete -  Medication Review Press photographer) Complete -  Some recent data might be hidden

## 2021-02-24 NOTE — Progress Notes (Addendum)
2:30pm: CSW spoke with admissions at [facility] in [place], [place] - they are agreeable to review referral, clinicals faxed over for review.  CSW 12pm: CSW received message from [place] at [place] - facility unable to offer patient a bed due to history of noncompliance.   CSW will continue search for placement.  [name], MSW, LCSW Transitions of Care  Clinical Social Worker II [phone]

## 2021-02-25 DIAGNOSIS — R569 Unspecified convulsions: Secondary | ICD-10-CM | POA: Diagnosis not present

## 2021-02-25 LAB — GLUCOSE, CAPILLARY
Glucose-Capillary: 110 mg/dL — ABNORMAL HIGH (ref 70–99)
Glucose-Capillary: 116 mg/dL — ABNORMAL HIGH (ref 70–99)
Glucose-Capillary: 92 mg/dL (ref 70–99)
Glucose-Capillary: 93 mg/dL (ref 70–99)
Glucose-Capillary: 95 mg/dL (ref 70–99)

## 2021-02-25 MED ORDER — NALTREXONE HCL 50 MG PO TABS
25.0000 mg | ORAL_TABLET | Freq: Every day | ORAL | Status: DC
  Administered 2021-02-25 – 2021-03-07 (×11): 25 mg
  Filled 2021-02-25 (×11): qty 1

## 2021-02-25 MED ORDER — AMLODIPINE BESYLATE 5 MG PO TABS
5.0000 mg | ORAL_TABLET | Freq: Every morning | ORAL | Status: DC
  Administered 2021-02-26 – 2021-03-07 (×9): 5 mg
  Filled 2021-02-25 (×10): qty 1

## 2021-02-25 NOTE — Progress Notes (Signed)
CSW spoke with admissions at [facility] in [place], [place] - referral was received and is currently under review at this time.  [name], MSW, LCSW Transitions of Care  Clinical Social Worker II [phone]

## 2021-02-25 NOTE — Progress Notes (Signed)
[facility] PROGRESS NOTE  Joshua [name] [id number] DOB: Jan 13, 1981 DOA: 02/04/2021 PCP: Patient, [name]  Status: Remains inpatient appropriate because:Altered mental status, Unsafe d/c plan and Inpatient level of care appropriate due to severity of illness   Dispo: The patient is from: Home              Anticipated d/c is to: SNF              Patient currently is medically stable to d/c.   Difficult to place patient Yes-needs LT SNF-has Medicaid appears to be active in [place] but and active in [place]/family would like to place him in [place] if possible; Pharmacotherapy has improved patient's behavioral response in context of acute anoxic brain injury.  As of 3/1 mittens are off.  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  2/28 [facility] in [facility] called-they have beds and will review his clinicals   Level of care: Telemetry Medical  Code Status: Full Family Communication: Mother on 2/28-gave name of facility in [facility]) that her daughter had been reviewing. Aware if no bed offers would need to dc home with family. TOC will arrange [facility] and DME if determined needs to dc home. Care issues at home influenced by pt behaviors and family acutely physical unable to care for pt due to recent surgeries. DVT prophylaxis: SCDs Vaccination status: 2/28 Spoke with his mother who agreed to Covid vaccine-order placed for Janssen vaccine  Foley catheter: No  HPI: 40 year old male with a history of intracranial hemorrhage in 08/2019 requiring a hemicraniectomy and evacuation was discharged home in May 2021.  The patient has subsequently had multiple seizures with multiple ER visits for them. He presented on 02/04/2021 due to family's concern that he was having another seizure.  He had a leftward gaze which was noted in the ED as well.  Despite receiving Ativan, it did not improve and therefore  he was given an IV Keppra load.   Subjective: Awake.  More pleasant today.  Attempting to verbalize.  Mittens are off.  Grabbing at my hand and patting it.  Attempted to place arm around my shoulder and pull me towards him.  Bedside nurse stated he attempted to kiss her yesterday.  Of note, previous facility [place] stated that patient had had issues with inappropriate sexual behaviors which are likely from his brain injury.  Objective: Vitals:   02/25/21 0136 02/25/21 0600  BP: 96/65 104/74  Pulse: 73   Resp: 19 18  Temp: 98.9 F (37.2 C)   SpO2: 98%    No intake or output data in the 24 hours ending 02/25/21 0751 Filed Weights   02/19/21 0324 02/21/21 0521 02/25/21 0500  Weight: 43.8 kg 45.8 kg 46.8 kg    Exam:  Constitutional: Awake, alert and pleasant Respiratory: Bilateral lung sounds clear to auscultation and he is stable on room air Cardiovascular: Normal, some issues with suboptimal blood pressure readings, pulses regular, no peripheral edema Abdomen:  PEG tube in place with continuous tube feedings infusing.  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 Appears to have normal tone of left upper extremity which he uses to pull up blanket Neurologic: Cranial nerves II through XII appear to be grossly intact  Psychiatric: Awake, remains calm and noncombative.  Some inappropriate touching noted per myself today and by nursing staff per their report.  Oriented times  name.  Attempting to phonate but was unable to follow any commands i.e. please raise one finger if you understand what I am saying.   Assessment/Plan: Acute problems: Seizure with new chronic encephalopathy/suspected anoxic brain injury -Likely secondary to prior brain injury related to intracranial hemorrhage -Prior to recent seizures and resultant brain injury he had been able to eat orally -EEG ordered but then  canceled as patient was noncooperative -Continue Keppra 1 g twice daily- Neurology has signed off  -Added Seroquel 25 mg per tube twice daily on 2/25 with improvement in behaviors although nursing staff still says he becomes combative at times.  Will increase nocturnal dose to 50 mg. 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  Suspect improved blood pressure readings as agitation improved with the initiation of Seroquel - continue Norvasc decreased to 5 mg daily, continue hydralazine and Metoprolol for now and can decrease dosages if blood pressure remains suboptimal  Severe protein-calorie malnutrition, underweight Dysphagia Body mass index is 13.1 kg/m. -  He has PEG tube and is tolerating tube feeds - also on D 2 diet with thin liquids but is not eating or drinking (declines to) - continue Prostat supplement  - I have been speaking with nursing about making sure the abdominal binder is covering his PEG to protect it. Nutrition Problem: Inadequate oral intake Etiology: dysphagia Signs/Symptoms: meal completion < 25% Interventions: Prostat,Tube feeding  -Apparently was on an oral diet prior to most recent anoxic brain injury.  Now is more alert and attempting to phonate.  Have asked speech therapy to reevaluate patient swallowing  Other problems: CKD 3 - Creatinine stable   Data Reviewed: Basic Metabolic Panel: No results for input(s): NA, K, CL, CO2, GLUCOSE, BUN, CREATININE, CALCIUM, MG, PHOS in the last 168 hours. Liver Function Tests: No results for input(s): AST, ALT, ALKPHOS, BILITOT, PROT, ALBUMIN in the last 168 hours. No results for input(s):  LIPASE, AMYLASE in the last 168 hours. No results for input(s): AMMONIA in the last 168 hours. CBC: No results for input(s): WBC, NEUTROABS, HGB, HCT, MCV, PLT in the last 168 hours. Cardiac Enzymes: No results for input(s): CKTOTAL, CKMB, CKMBINDEX, TROPONINI in the last 168 hours. BNP (last 3 results) No results for input(s): BNP in the last 8760 hours.  ProBNP (last 3 results) No results for input(s): PROBNP in the last 8760 hours.  CBG: Recent Labs  Lab 02/23/21 2357 02/24/21 0412 02/24/21 0740 02/24/21 1200 02/24/21 2104  GLUCAP 104* 115* 101* 94 93    No results found for this or any previous visit (from the past 240 hour(s)).   Studies: No results found.  Scheduled Meds: . amLODipine  10 mg Per Tube q AM  . COVID-19 Ad26 vaccine (JANSSEN/J&J)  0.5 mL Intramuscular Once  . feeding supplement (PROSource TF)  45 mL Per Tube BID  . free water  200 mL Per Tube Q4H  . hydrALAZINE  25 mg Per Tube Q8H  . levETIRAcetam  1,000 mg Per Tube Q12H  . metoprolol tartrate  50 mg Per Tube BID  . QUEtiapine  25 mg Per Tube Daily  . QUEtiapine  50 mg Per Tube  QHS   Continuous Infusions: . 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

## 2021-02-26 DIAGNOSIS — R569 Unspecified convulsions: Secondary | ICD-10-CM | POA: Diagnosis not present

## 2021-02-26 LAB — GLUCOSE, CAPILLARY
Glucose-Capillary: 106 mg/dL — ABNORMAL HIGH (ref 70–99)
Glucose-Capillary: 125 mg/dL — ABNORMAL HIGH (ref 70–99)
Glucose-Capillary: 96 mg/dL (ref 70–99)
Glucose-Capillary: 99 mg/dL (ref 70–99)
Glucose-Capillary: 99 mg/dL (ref 70–99)

## 2021-02-26 MED ORDER — ACETAMINOPHEN 325 MG PO TABS
650.0000 mg | ORAL_TABLET | Freq: Four times a day (QID) | ORAL | Status: DC | PRN
  Administered 2021-02-26: 650 mg via ORAL
  Filled 2021-02-26 (×2): qty 2

## 2021-02-26 NOTE — Progress Notes (Signed)
[facility] PROGRESS NOTE  Joshua [name] [id number] DOB: 02-04-1981 DOA: 02/04/2021 PCP: Patient, [name]  Status: Remains inpatient appropriate because:Altered mental status, Unsafe d/c plan and Inpatient level of care appropriate due to severity of illness   Dispo: The patient is from: Home              Anticipated d/c is to: SNF              Patient currently is medically stable to d/c.   Difficult to place patient Yes-needs LT SNF-has Medicaid appears to be active in [place] but and active in [place]/family would like to place him in [place] if possible; Pharmacotherapy has improved patient's behavioral response in context of acute anoxic brain injury.  As of 3/1 mittens are off.  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  2/28 [facility] in [facility] called-they have beds and will review his clinicals   Level of care: Telemetry Medical  Code Status: Full Family Communication: Mother on 2/28-gave name of facility in [facility]) that her daughter had been reviewing. Aware if no bed offers would need to dc home with family. TOC will arrange [place] and DME if determined needs to dc home. Care issues at home influenced by pt behaviors and family acutely physical unable to care for pt due to recent surgeries. DVT prophylaxis: SCDs Vaccination status: 2/28 Spoke with his mother who agreed to Covid vaccine-order placed for Janssen vaccine  Foley catheter: No  HPI: 40 year old male with a history of intracranial hemorrhage in 08/2019 requiring a hemicraniectomy and evacuation was discharged home in May 2021.  The patient has subsequently had multiple seizures with multiple ER visits for them. He presented on 02/04/2021 due to family's concern that he was having another seizure.  He had a leftward gaze which was noted in the ED as well.  Despite receiving Ativan, it did not improve and therefore  he was given an IV Keppra load.   Subjective: Alert, calm, continues without any aggressive behaviors although does like to grab and touched my hand while I am examining him.  Objective: Vitals:   02/25/21 2331 02/26/21 0402  BP: 102/67 (!) 142/98  Pulse: 74 (!) 101  Resp: 16   Temp: 97.7 F (36.5 C) 98.4 F (36.9 C)  SpO2: 100% 100%    Intake/Output Summary (Last 24 hours) at 02/26/2021 0755 Last data filed at 02/26/2021 0604 Gross per 24 hour  Intake --  Output 1150 ml  Net -1150 ml   Filed Weights   02/21/21 0521 02/25/21 0500 02/26/21 0500  Weight: 45.8 kg 46.8 kg 48.1 kg    Exam:  Constitutional: Awake, calm in no acute distress Respiratory: Lungs clear to auscultation stable on room air Cardiovascular: Normal heart sounds, no tachycardia, pulses regular. Abdomen:  PEG tube in place with feedings infusing.  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 Appears to have normal tone of left upper extremity which he uses purposefully Neurologic: Cranial nerves II through XII appear to be grossly intact  Psychiatric: Awake, responds to name but nonverbal so unable to accurately assess orientation   Assessment/Plan: Acute problems: Seizure with new chronic encephalopathy/suspected anoxic brain injury -Likely secondary to prior brain injury related to intracranial hemorrhage -Prior to recent seizures and resultant brain injury he had been able to eat orally -EEG ordered but then canceled  as patient was noncooperative -Continue Keppra 1 g twice daily- Neurology has signed off  -Added Seroquel 25 mg per tube twice daily on 2/25 with improvement in behaviors although nursing staff still says he becomes combative at times.  Will increase nocturnal dose to 50 mg. 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  Suspect improved blood pressure readings as agitation improved with the initiation of Seroquel - continue Norvasc decreased to 5 mg daily, continue hydralazine and Metoprolol for now and can decrease dosages if blood pressure remains suboptimal  Severe protein-calorie malnutrition, underweight Dysphagia Body mass index is 13.1 kg/m. -  He has PEG tube and is tolerating tube feeds - also on D 2 diet with thin liquids but is not eating or drinking (declines to) - continue Prostat supplement  - I have been speaking with nursing about making sure the abdominal binder is covering his PEG to protect it. Nutrition Problem: Inadequate oral intake Etiology: dysphagia Signs/Symptoms: meal completion < 25% Interventions: Prostat,Tube feeding  -SLP reevaluated on 3/2.  Continue D2 diet with thin liquids but patient will also need soft hold finger foods that he can self-feed.  We will also need to have staff at bedside to feed patient otherwise until oral intake improves.  Other problems: CKD 3 - Creatinine stable   Data Reviewed: Basic Metabolic Panel: No results for input(s): NA, K, CL, CO2, GLUCOSE, BUN, CREATININE, CALCIUM, MG, PHOS in the last 168 hours. Liver Function Tests: No results for input(s): AST, ALT, ALKPHOS, BILITOT, PROT, ALBUMIN in the last 168 hours. No results for input(s): LIPASE, AMYLASE in the last 168 hours. No results for input(s): AMMONIA in the last 168 hours. CBC: No results for input(s): WBC, NEUTROABS, HGB, HCT, MCV, PLT in the last 168 hours. Cardiac Enzymes: No results for input(s): CKTOTAL, CKMB, CKMBINDEX, TROPONINI in the last 168 hours. BNP (last 3 results) No results for input(s): BNP in the last 8760  hours.  ProBNP (last 3 results) No results for input(s): PROBNP in the last 8760 hours.  CBG: Recent Labs  Lab 02/25/21 1141 02/25/21 1558 02/25/21 2020 02/25/21 2331 02/26/21 0404  GLUCAP 110* 93 116* 92 99    No results found for this or any previous visit (from the past 240 hour(s)).   Studies: No results found.  Scheduled Meds: . amLODipine  5 mg Per Tube q AM  . feeding supplement (PROSource TF)  45 mL Per Tube BID  . free water  200 mL Per Tube Q4H  . hydrALAZINE  25 mg Per Tube Q8H  . levETIRAcetam  1,000 mg Per Tube Q12H  . metoprolol tartrate  50 mg Per Tube BID  . naltrexone  25 mg Per Tube Daily  . QUEtiapine  25 mg Per Tube Daily  . QUEtiapine  50 mg Per Tube QHS   Continuous Infusions: . 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

## 2021-02-26 NOTE — Evaluation (Signed)
Clinical/Bedside Swallow Evaluation Patient Details  Name: Joshua [name] MRN: [id number] Date of Birth: 05-07-1981  Today's Date: 02/26/2021 Time: SLP Start Time (ACUTE ONLY): [id number] SLP Stop Time (ACUTE ONLY): 0945 SLP Time Calculation (min) (ACUTE ONLY): 27 min  Past Medical History: History reviewed. No pertinent past medical history. Past Surgical History:  Past Surgical History:  Procedure Laterality Date  . CRANIOTOMY Left 09/09/2019   Procedure: DECOMPRESSIVE CRANIECTOMY AND  HEMATOMA EVACUATION, IMPLANTATION OF SKULL FLAP IN RIGHT ABDOMEN;  Surgeon: Erline Levine, MD;  Location: [facility];  Service: Neurosurgery;  Laterality: Left;  . ESOPHAGOGASTRODUODENOSCOPY (EGD) WITH PROPOFOL N/A 09/28/2019   Procedure: ESOPHAGOGASTRODUODENOSCOPY (EGD) WITH PROPOFOL;  Surgeon: Georganna Skeans, MD;  Location: [facility] ENDOSCOPY;  Service: General;  Laterality: N/A;  . PEG PLACEMENT N/A 09/28/2019   Procedure: PERCUTANEOUS ENDOSCOPIC GASTROSTOMY (PEG) PLACEMENT;  Surgeon: Georganna Skeans, MD;  Location[facility] ENDOSCOPY;  Service: General;  Laterality: N/A;   HPI:  HPI: Joshua [name] is a 40 y.o. 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 Presents today by EMS for concern for seizures. Patient at baseline is nonverbal with right hemiplegia and requires full assistance with ADLs, and is being cared for at home by family. CT of head without acute intracranial abnormality. 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 Chest x ray with mild hazy airspace opacity within the right mid and lower lung which could be due to atelectasis and/or infectious etiology.  Pt has been in hospital for 21 days  - pt now has a PEG.   Assessment / Plan / Recommendation Clinical Impression  POs at bedside continues to be limited due to pt's decreased acceptance of intake - suspect impact from altered cognition from prior ICH and possible lack of appetite with tube feeding.  Reviewed prior evaluation of swallowing two weeks ago.  Pt did not follow directions for oral motor exam but was able to help hold his own cup and graham cracker dipped in ice cream with his left hand.  He produced adequate labial tension on straw to consume several boluses of grape juice.  No indication of aspiration with all po intake pt accepted including ice cream, graham cracker and grape juice via straw.  Liquid swallows obvioulsy more effienct that solids/puree.  Pt winces with swallowing - causing SLP to suspect gustatory changes with this event.  Provided pt with toothbrush/paste to allow dental care for improved hygiene and taste - but pt placed toothbrush in his mouth and consumed paste off of brush.  He did not allow SLP  to provide oral care.  Recommend pt help self feed as much as able to encourage po intake and for safety.    Advise to continue dys2/thin but allow pt soft whole finger foods from the floor that he can self feed.  Will follow up to determine tolerance and readiness for further advancement and tolerance.  Posted new swallow precaution signs. SLP Visit Diagnosis: Dysphagia, oropharyngeal phase (R13.12)    Aspiration Risk  Mild aspiration risk;Moderate aspiration risk    Diet Recommendation     Medication Administration: Via alternative means Compensations: Small sips/bites;Slow rate;Minimize environmental distractions;Follow solids with liquid (pt to help self feed)    Other  Recommendations Oral Care Recommendations: Oral care  BID   Follow up Recommendations 24 hour supervision/assistance      Frequency and Duration min 1 x/week  1 week       Prognosis Prognosis for Safe Diet Advancement:  Fair Barriers to Reach Goals: Cognitive deficits;Severity of deficits;Language deficits      Swallow Study   General Date of Onset: 02/05/21 HPI: HPI: Joshua [name] is a 40 y.o. 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 Presents today by EMS for concern for seizures. Patient at baseline is nonverbal with right hemiplegia and requires full assistance with ADLs, and is being cared for at home by family. CT of head without acute intracranial abnormality. 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 Chest x ray with mild hazy airspace opacity within the right mid and lower lung which could be due to atelectasis and/or infectious etiology.  Pt has been in hospital for 21 days - pt now has a PEG. Type of Study: Bedside Swallow Evaluation Previous Swallow Assessment: prior swallow workup 2021 unremarkable Diet Prior to this Study: Thin liquids;Dysphagia 2 (chopped) Temperature Spikes Noted: No Respiratory Status: Room air History of Recent Intubation: No Behavior/Cognition: Alert;Doesn't follow directions Oral Cavity Assessment: Other (comment) (pt did not open his mouth adequately to observe oral cavity) Oral Care Completed by SLP: Other (Comment) (pt resistive unable to complete diligent oral care despite attempts) Oral Cavity - Dentition: Other (Comment);Missing dentition (some dentition noted, pt unable to open mouth fully due to cognitive deficits) Vision: Impaired for self-feeding (? left inattention, visual deficits) Self-Feeding Abilities: Able to feed self Patient Positioning: Upright in bed Baseline Vocal Quality: Low vocal intensity (pt with attempts to  phonate x2 only, minimal) Volitional Cough: Weak;Congested Volitional Swallow: Unable to elicit    Oral/Motor/Sensory Function Overall Oral Motor/Sensory Function: Other (comment) (unable to fully assess due to pt inability to follow commands)   Ice Chips Ice chips: Not tested (pt refused)   Thin Liquid Thin Liquid: Impaired Presentation: Straw Oral Phase Impairments: Poor awareness of bolus Pharyngeal  Phase Impairments: Suspected delayed Swallow    Nectar Thick Nectar Thick Liquid: Not tested   Honey Thick Honey Thick Liquid: Not tested   Puree Puree: Impaired Presentation: Spoon Oral Phase Impairments: Reduced labial seal;Reduced lingual movement/coordination Oral Phase Functional Implications: Prolonged oral transit   Solid     Solid: Impaired Presentation: Self Fed Oral Phase Impairments: Reduced lingual movement/coordination Pharyngeal Phase Impairments: Suspected delayed Swallow      Joshua [name] 02/26/2021,10:20 AM   Kathleen Lime, MS [facility] SLP Acute Rehab Services Office [phone] Pager [phone]  .

## 2021-02-27 ENCOUNTER — Inpatient Hospital Stay (HOSPITAL_COMMUNITY): Payer: Medicaid - Out of State

## 2021-02-27 DIAGNOSIS — R569 Unspecified convulsions: Secondary | ICD-10-CM | POA: Diagnosis not present

## 2021-02-27 LAB — GLUCOSE, CAPILLARY
Glucose-Capillary: 141 mg/dL — ABNORMAL HIGH (ref 70–99)
Glucose-Capillary: 114 mg/dL — ABNORMAL HIGH (ref 70–99)
Glucose-Capillary: 106 mg/dL — ABNORMAL HIGH (ref 70–99)
Glucose-Capillary: 106 mg/dL — ABNORMAL HIGH (ref 70–99)
Glucose-Capillary: 107 mg/dL — ABNORMAL HIGH (ref 70–99)

## 2021-02-27 LAB — URINALYSIS, ROUTINE W REFLEX MICROSCOPIC
Bilirubin Urine: NEGATIVE
Glucose, UA: NEGATIVE mg/dL
Hgb urine dipstick: NEGATIVE
Ketones, ur: NEGATIVE mg/dL
Leukocytes,Ua: NEGATIVE
Nitrite: NEGATIVE
Protein, ur: NEGATIVE mg/dL
Specific Gravity, Urine: 1.015 (ref 1.005–1.030)
pH: 6 (ref 5.0–8.0)

## 2021-02-27 LAB — C-REACTIVE PROTEIN: CRP: 1.5 mg/dL — ABNORMAL HIGH (ref <1.0)

## 2021-02-27 LAB — LACTATE DEHYDROGENASE: LDH: 106 U/L (ref 98–192)

## 2021-02-27 NOTE — Progress Notes (Signed)
[facility] PROGRESS NOTE  Joshua [name] [id number] DOB: 14-Feb-1981 DOA: 02/04/2021 PCP: Patient, [name]  Status: Remains inpatient appropriate because:Altered mental status, Unsafe d/c plan and Inpatient level of care appropriate due to severity of illness   Dispo: The patient is from: Home              Anticipated d/c is to: SNF              Patient currently is medically stable to d/c.   Difficult to place patient Yes-needs LT SNF-has Medicaid appears to be active in [place] but and active in [place]/family would like to place him in [place] if possible; Pharmacotherapy has improved patient's behavioral response in context of acute anoxic brain injury.  As of 3/1 mittens are off.  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 Aware if no bed offers would need to dc home with family. TOC will arrange [place] and DME if determined needs to dc home. Care issues at home influenced by pt behaviors and family acutely physical unable to care for pt due to recent surgeries. DVT prophylaxis: SCDs Vaccination status: 2/28 Spoke with his mother who agreed to Covid vaccine-order placed for Janssen vaccine that was given 2/28  Foley catheter: No  HPI: 40 year old male with a history of intracranial hemorrhage in 08/2019 requiring a hemicraniectomy and evacuation was discharged home in May 2021.  The patient has subsequently had multiple seizures with multiple ER visits for them. He presented on 02/04/2021 due to family's concern that he was having another seizure.  He had a leftward gaze which was noted in the ED as well.  Despite receiving Ativan, it did not  improve and therefore he was given an IV Keppra load.   Subjective: Awake. Slightly more restless than yesterday. I have to let him hold my right hand so I can examine him with my left hand. He does not attempt to remove my left hand when I am examining him. Skin feels hot. Remains nonverbal.  Objective: Vitals:   02/26/21 2342 02/27/21 0400  BP: 107/68 104/71  Pulse: 90 80  Resp: 18 18  Temp: 98.9 F (37.2 C) 98.9 F (37.2 C)  SpO2: 98% 99%    Intake/Output Summary (Last 24 hours) at 02/27/2021 0753 Last data filed at 02/26/2021 1837 Gross per 24 hour  Intake --  Output 1250 ml  Net -1250 ml   Filed Weights   02/25/21 0500 02/26/21 0500 02/27/21 0500  Weight: 46.8 kg 48.1 kg 50.4 kg    Exam:  Constitutional: Fever w/ TM 101.2 ax in past 24 hrs, slightly more restless than previous days Respiratory: Lungs are clear, diminished in the bases, no increased work of breathing and he is on room air Cardiovascular: Heart sounds, regular pulse without tachycardia, extremities warm to the touch Abdomen:  PEG tube in place with feedings infusing.   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 Appears to have normal tone of left upper extremity which he uses purposefully Neurologic: Cranial nerves II through XII appear to be grossly intact  Psychiatric: Awake alert, nonverbal. Unable to accurately assess orientation.   Assessment/Plan: Acute problems: Seizure  with new chronic encephalopathy/suspected anoxic brain injury -Likely secondary to prior brain injury related to intracranial hemorrhage -Prior to recent seizures and resultant brain injury he had been able to eat orally -EEG ordered but then canceled as patient was noncooperative -Continue Keppra 1 g twice daily- Neurology has signed off  -Added Seroquel 25 mg per tube twice daily on 2/25 with improvement in behaviors although nursing staff still says he becomes combative  at times.  Will increase nocturnal dose to 50 mg. 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 Urinalysis and culture collection pending  HTN with  H/o PRES - BP stable during the day but overnight SBP's over the past several nights have been low in the 80s to 90s.  Suspect improved blood pressure readings as agitation improved with the initiation of Seroquel - continue Norvasc decreased to 5 mg daily, continue hydralazine and Metoprolol for now and can decrease dosages if blood pressure remains suboptimal  Severe protein-calorie malnutrition, underweight Dysphagia Body mass index is 13.1 kg/m. -  He has PEG tube and is tolerating tube feeds - also on D 2 diet with thin liquids but is not eating or drinking (declines to) - continue Prostat supplement  - I have been speaking with nursing about making sure the abdominal binder is covering his PEG to protect it. Nutrition Problem: Inadequate oral intake Etiology: dysphagia Signs/Symptoms: meal completion < 25% Interventions: Prostat,Tube feeding  -SLP reevaluated on 3/2.  Continue D2 diet with thin liquids but patient will also need soft hold finger foods that he can self-feed.  We will also need to have staff at bedside to feed patient otherwise until oral intake improves.  Other problems: CKD 3 - Creatinine stable   Data Reviewed: Basic Metabolic Panel: No results for input(s): NA, K, CL, CO2, GLUCOSE, BUN, CREATININE, CALCIUM, MG, PHOS in the last 168 hours. Liver Function Tests: No results for input(s): AST, ALT, ALKPHOS, BILITOT, PROT, ALBUMIN in the last 168 hours. No results for input(s): LIPASE,  AMYLASE in the last 168 hours. No results for input(s): AMMONIA in the last 168 hours. CBC: No results for input(s): WBC, NEUTROABS, HGB, HCT, MCV, PLT in the last 168 hours. Cardiac Enzymes: No results for input(s): CKTOTAL, CKMB, CKMBINDEX, TROPONINI in the last 168 hours. BNP (last 3 results) No results for input(s): BNP in the last 8760 hours.  ProBNP (last 3 results) No results for input(s): PROBNP in the last 8760 hours.  CBG: Recent Labs  Lab 02/26/21 0826 02/26/21 1211 02/26/21 2058 02/26/21 2347 02/27/21 0402  GLUCAP 99 106* 96 125* 141*    No results found for this or any previous visit (from the past 240 hour(s)).   Studies: No results found.  Scheduled Meds: . amLODipine  5 mg Per Tube q AM  . feeding supplement (PROSource TF)  45 mL Per Tube BID  . free water  200 mL Per Tube Q4H  . hydrALAZINE  25 mg Per Tube Q8H  . levETIRAcetam  1,000 mg Per Tube Q12H  . metoprolol tartrate  50 mg Per Tube BID  . naltrexone  25 mg Per Tube Daily  . QUEtiapine  25 mg Per Tube Daily  .  QUEtiapine  50 mg Per Tube QHS   Continuous Infusions: . 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

## 2021-02-27 NOTE — Progress Notes (Addendum)
Nutrition Follow-up  DOCUMENTATION CODES:   Underweight  INTERVENTION:   Continue tube feeding via PEG: - Osmolite 1.5 @ 60 ml/hr (1440 ml/day) - ProSource TF 45 ml BID - Free water flushes of 200 ml q 4 hours  Tube feeding regimen provides 2240 kcal, 112 grams of protein, and 1097 ml of H2O.   Total free water with flushes: 2297 ml  NUTRITION DIAGNOSIS:   Inadequate oral intake related to dysphagia as evidenced by meal completion < 25%.  Ongoing  GOAL:   Patient will meet greater than or equal to 90% of their needs  Met via TF  MONITOR:   TF tolerance,Labs,Weight trends,I & O's  REASON FOR ASSESSMENT:   Consult Assessment of nutrition requirement/status,Enteral/tube feeding initiation and management  ASSESSMENT:   Pt admitted with seizure in the setting of large L ICH s/p craniotomy, recurrent seizures. PMH includes large ICH (08/2019) requiring hemicraniectomy and evacuation followed by extensive hospitalization; pt discharged in May 2021. Pt has since experienced the development of seizures resulting in multiple ER visits/hospital admissions for concern for seizures/seizure-like activity  Pt pending d/c to SNF. Condition unchanged per MD. Pt nonverbal.   Pt remains on dysphagia 2 diet with thin liquids but continues to refuse meals. Pt requiring tube feeds to meet 100% of nutritional needs. Tolerating TF per RN. Current TF: Osmolite 1.5 @ 60 ml/hr, ProSource TF 45 ml BID, free water flushes 200 ml q 4 hours  Medications reviewed. Labs: CBGs 114-106-106  UOP: 1250 ml x 24 hours  NUTRITION - FOCUSED PHYSICAL EXAM:  Attempted NFPE but pt refused.   Diet Order:   Diet Order            DIET DYS 2 Room service appropriate? Yes; Fluid consistency: Thin  Diet effective now                 EDUCATION NEEDS:   No education needs have been identified at this time  Skin:  Skin Assessment: Reviewed RN Assessment  Last BM:  3/1 type 2  Height:   Ht  Readings from Last 1 Encounters:  02/06/21 6' (1.829 m)    Weight:   Wt Readings from Last 1 Encounters:  02/27/21 50.4 kg    BMI:  Body mass index is 15.07 kg/m.  Estimated Nutritional Needs:   Kcal:  2000-2200  Protein:  100-115 grams  Fluid:  >2L   Amanda Averett, MS, RD, LDN RD pager number and weekend/on-call pager number located in Amion.  

## 2021-02-28 ENCOUNTER — Inpatient Hospital Stay (HOSPITAL_COMMUNITY): Payer: Medicaid - Out of State

## 2021-02-28 DIAGNOSIS — Z1629 Resistance to other single specified antibiotic: Secondary | ICD-10-CM | POA: Diagnosis not present

## 2021-02-28 DIAGNOSIS — R7881 Bacteremia: Secondary | ICD-10-CM | POA: Diagnosis not present

## 2021-02-28 DIAGNOSIS — I358 Other nonrheumatic aortic valve disorders: Secondary | ICD-10-CM

## 2021-02-28 DIAGNOSIS — B957 Other staphylococcus as the cause of diseases classified elsewhere: Secondary | ICD-10-CM

## 2021-02-28 DIAGNOSIS — I809 Phlebitis and thrombophlebitis of unspecified site: Secondary | ICD-10-CM

## 2021-02-28 DIAGNOSIS — I330 Acute and subacute infective endocarditis: Secondary | ICD-10-CM | POA: Diagnosis not present

## 2021-02-28 DIAGNOSIS — G9349 Other encephalopathy: Secondary | ICD-10-CM

## 2021-02-28 DIAGNOSIS — Z9889 Other specified postprocedural states: Secondary | ICD-10-CM

## 2021-02-28 LAB — ECHOCARDIOGRAM COMPLETE
Height: 72 in
S' Lateral: 2.3 cm
Weight: 1777.79 oz

## 2021-02-28 LAB — BLOOD CULTURE ID PANEL (REFLEXED) - BCID2

## 2021-02-28 LAB — URINE CULTURE

## 2021-02-28 LAB — GLUCOSE, CAPILLARY
Glucose-Capillary: 102 mg/dL — ABNORMAL HIGH (ref 70–99)
Glucose-Capillary: 104 mg/dL — ABNORMAL HIGH (ref 70–99)
Glucose-Capillary: 116 mg/dL — ABNORMAL HIGH (ref 70–99)
Glucose-Capillary: 118 mg/dL — ABNORMAL HIGH (ref 70–99)
Glucose-Capillary: 121 mg/dL — ABNORMAL HIGH (ref 70–99)
Glucose-Capillary: 129 mg/dL — ABNORMAL HIGH (ref 70–99)

## 2021-02-28 MED ORDER — VANCOMYCIN HCL 1000 MG/200ML IV SOLN
1000.0000 mg | INTRAVENOUS | Status: DC
  Administered 2021-03-01 – 2021-03-04 (×4): 1000 mg via INTRAVENOUS
  Filled 2021-02-28 (×5): qty 200

## 2021-02-28 MED ORDER — VANCOMYCIN HCL 1250 MG/250ML IV SOLN
1250.0000 mg | Freq: Once | INTRAVENOUS | Status: AC
  Administered 2021-02-28: 1250 mg via INTRAVENOUS
  Filled 2021-02-28: qty 250

## 2021-02-28 NOTE — TOC Progression Note (Signed)
Transition of Care [facility]) - Progression Note    Patient Details  Name: Joshua [name] MRN: [id number] Date of Birth: 02-13-1981  Transition of Care [facility]) CM/SW [place], RN Phone Number: 02/28/2021, 2:23 PM  Clinical Narrative:    Case management spoke with the patient's mother, Joshua [name], on the phone and explained to her that the patient was declined by [facility] in [place], [place] for SNF placement.  I spoke with the patient's mother on the phone and spoke with her regarding taking the patient home for care with home health services.  The patient's mother, Joshua [name], was agreeable but explained that she will need a hospital bed for discharge to be delivered to the home and specialty wheelchair set up at a later date at the home.  I called and spoke with [facility] and spoke with Joshua [name], [place] at [phone] and orders and clinicals were faxed to [phone] to process hospital bed and delivery.  In the meantime, the patient received an ultrasound for positive blood cultures and valvular endocarditis diagnosis at this point and will need f/u regarding IV antibiotics.  The patient's mother is aware at this point after talking with Joshua Hearing, NP.  I also spoke with Joshua [name], ATT with dme equipment company at [phone] and will follow up concerning getting the patient set up with a possible tilt wheelchair.  CM will continue to follow the patient for transitions of care needs.   Expected Discharge Plan: [place] Barriers to Discharge: Continued Medical Work up  Expected Discharge Plan and Services Expected Discharge Plan: [place] In-house Referral: Clinical Social Work Discharge Planning Services: CM Consult Post Acute Care Choice: Durable Medical [place] arrangements for the past 2 months: [place] (Discharging home to [place], [place])                 DME Arranged: Hospital bed  (Specialty wheelchair thru - [place] in [facility]) [place]:  Joshua [name], ATT for specialty wheelchair - [phone];  [facility] in [place], [place]) Date DME Agency Contacted: 02/28/21 Time DME Agency Contacted: [phone] Representative spoke with at DME Agency: Joshua [name] ATT - [phone] HH Arranged: RN,PT,OT,IV Antibiotics,Nurse's Aide [place] Agency: [place] Date [place]: 02/25/21   Representative spoke with at [place]: Joshua [name], [facility] at [place] in [facility] [phone]   Social Determinants of Health ([place]) Interventions    Readmission Risk Interventions Readmission Risk Prevention Plan 02/24/2021 07/04/2020  Transportation Screening Complete Complete  PCP or Specialist Appt within 5-7 Days - Complete  PCP or Specialist Appt within 3-5 Days Complete -  Home Care Screening - Complete  Medication Review (RN CM) - Complete  HRI or Home Care Consult Complete -  Social Work Consult for [place] Planning/Counseling Complete -  Palliative Care Screening Complete -  Medication Review Press photographer) Complete -  Some recent data might be hidden

## 2021-02-28 NOTE — Consult Note (Signed)
Joshua [name] for Infectious Disease    Date of Admission:  02/04/2021     Total days of antibiotics 2  Vancomycin            Reason for Consult: MRSE bacteremia     Referring Provider: Cruzita Lederer  Primary Care Provider: Patient, [name]    Assessment: Joshua [name] is a 40 y.o. male with a h/o ICH s/p hemicraniectomy 08-2019 with prolonged hospitalization, TBI (non-verbal baseline), seizures. Admitted for evaluation of seizure-like activity from local SNF.  He has had a few isolated fevers during hospitalization, most recently to 36 F following J&J COVID vaccine on 2/28. Blood cultures were drawn and reveal growth from at least one site and positive gram stain on the other collection site. He has been started on IV vancomycin for treatment.   In review of his hospital stay he has had some infrequent, isolated  low grade fevers first noted 2/17. No wounds noted, peg tube site well healed without concern for infection. Has 1 piv in place with the left bicep - this has been in for almost a month now. When we palpated over the site he did attempt to vocalize both times with clicking sounds - ?if painful for him. Either way would probably plan to D/C and relocate to another site on presumption he has some phlebitis. This could explain bacteremia esp with skin colonizer.  Unfortunately on his TTE there is a description of a possible mitral valve vegetation. I think it is probably unlikely he has endocarditis, but given he did have some fever documented back 2/17 he probably needs consideration for a TEE to help with antibiotic duration.   Please continue IV vancomycin  Would call cardiology for TEE given abnormal MV findings and "possible vegetation." This will help outline proper antibiotic treatment plan not only with duration but also mode of treatment. I think it is much more likely he has a bacteremia from phlebitis from prolonged PIV.  Exchange out current PIV,  please   Plan: 1. D/C current IV site due to concern for possible phlebitis / source of bacteremia  2. Start new PIV   3. Continue vancomycin  4. 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 amLODipine  5 mg Per Tube q AM  . feeding supplement (PROSource TF)  45 mL Per Tube BID  . free water  200 mL Per Tube Q4H  . hydrALAZINE  25 mg Per Tube Q8H  . levETIRAcetam  1,000 mg Per Tube Q12H  . metoprolol tartrate  50 mg Per Tube BID  . naltrexone  25 mg Per Tube Daily  . QUEtiapine  25 mg Per Tube Daily  . QUEtiapine  50 mg Per Tube QHS    HPI: Joshua [name] is a 40 y.o. male admitted from [facility] SNF due to concern over seizures from patient's family.   Chart history reviewed - patient non-verbal.  He has a h/o ICH s/p hemicraniectomy 08-2019 with prolonged hospitalization, TBI (non-verbal baseline), seizures. Admitted for evaluation of seizure-like activity from local SNF. Seizures were treated with Keppra. EEG was not able to be done d/t patient combativeness. Course of current hospitalization delayed with difficult to place SNF location.  He had a high fever on 3/3 to 101.3. Blood cultures were drawn and growing MRSE. He was started on vancomycin and ID asked to help with management.    Review of Systems: Review of Systems  Unable to perform ROS: Patient nonverbal    History reviewed. No pertinent past medical history.  Social History   Tobacco Use  . Smoking status: Former Research scientist (life sciences)  . Smokeless tobacco: Former Network engineer  . Vaping Use: Unknown  Substance Use Topics  . Alcohol use: Yes  . Drug use: Not Currently    History reviewed. No pertinent family history. No Known Allergies  OBJECTIVE: Blood pressure (!)  (P) 131/107, pulse (P) 71, temperature (P) 98 F (36.7 C), temperature source (P) Axillary, resp. rate (P) 16, height 6' (1.829 m), weight 50.4 kg, SpO2 (P) 100 %.   Physical Exam Vitals reviewed.  Constitutional:      Comments: Thin appearing male sitting in no distress in bed.   HENT:     Nose: Nose normal.     Mouth/Throat:     Mouth: Mucous membranes are moist.     Pharynx: Oropharynx is clear.  Eyes:     General: No scleral icterus.    Pupils: Pupils are equal, round, and reactive to light.  Cardiovascular:     Rate and Rhythm: Normal rate.     Heart sounds: No murmur heard.   Pulmonary:     Breath sounds: Rhonchi present.  Abdominal:     General: Bowel sounds are normal.     Palpations: Abdomen is soft.     Comments: PEG in place and clean   Skin:    General: Skin is dry.     Capillary Refill: Capillary refill takes less than 2 seconds.     Comments: No wounds - we were able to be in the room with the patient's care team while cleaning him up.  L bicep PIV in place since 2/9. The patient did appear to react with palpation  Neurological:     Mental Status: He is alert.     Comments: Non verbal. Makes clicking noise sometimes.      Lab Results Lab Results  Component Value Date   WBC 5.5 02/10/2021   HGB 13.5 02/10/2021   HCT 43.3 02/10/2021   MCV 81.2 02/10/2021   PLT 269 02/10/2021    Lab Results  Component Value Date   CREATININE 1.32 (H) 02/10/2021   BUN 16 02/10/2021   NA 139 02/10/2021   K 4.0 02/10/2021   CL 103 02/10/2021   CO2 25 02/10/2021    Lab Results  Component Value Date   ALT 11 02/10/2021   AST 10 (L) 02/10/2021   ALKPHOS 67 02/10/2021   BILITOT 0.7 02/10/2021     Microbiology: Recent Results (from the past 240 hour(s))  Culture, Urine     Status: Abnormal   Collection Time: 02/27/21  7:49 AM   Specimen: Urine, Catheterized  Result Value Ref Range Status   Specimen Description URINE, CATHETERIZED  Final   Special Requests    Final    NONE Performed at [facility], [address] [place], [place] [address]    Culture MULTIPLE SPECIES PRESENT, SUGGEST RECOLLECTION (A)  Final   Report Status 02/28/2021 FINAL  Final  Culture, blood (routine x 2)     Status: None (Preliminary result)   Collection Time: 02/27/21  8:50 AM   Specimen: BLOOD RIGHT HAND  Result Value Ref  Range Status   Specimen Description BLOOD RIGHT HAND  Final   Special Requests   Final    BOTTLES DRAWN AEROBIC AND ANAEROBIC Blood Culture results may not be optimal due to an inadequate volume of blood received in culture bottles   Culture  Setup Time   Final    GRAM POSITIVE COCCI IN CLUSTERS IN BOTH AEROBIC AND ANAEROBIC BOTTLES Organism ID to follow CRITICAL RESULT CALLED TO, READ BACK BY AND VERIFIED WITH: PHARMD T.JACKIE AT [id number] ON 02/28/2021 BY T.SAAD Performed at [facility], [place] [address], [place], [place] [address]    Culture GRAM POSITIVE COCCI IN CLUSTERS  Final   Report Status PENDING  Incomplete  Blood Culture ID Panel (Reflexed)     Status: Abnormal   Collection Time: 02/27/21  8:50 AM  Result Value Ref Range Status   Enterococcus faecalis NOT DETECTED NOT DETECTED Final   Enterococcus Faecium NOT DETECTED NOT DETECTED Final   Listeria monocytogenes NOT DETECTED NOT DETECTED Final   Staphylococcus species DETECTED (A) NOT DETECTED Final    Comment: CRITICAL RESULT CALLED TO, READ BACK BY AND VERIFIED WITH: PHARM D T.JACKIE AT [id number] ON 02/28/2021 BY T.SAAD.    Staphylococcus aureus (BCID) NOT DETECTED NOT DETECTED Final   Staphylococcus epidermidis DETECTED (A) NOT DETECTED Final    Comment: Methicillin (oxacillin) resistant coagulase negative staphylococcus. Possible blood culture contaminant (unless isolated from more than one blood culture draw or clinical case suggests pathogenicity). No antibiotic treatment is indicated for blood  culture contaminants. CRITICAL RESULT CALLED TO, READ BACK BY AND VERIFIED  WITH: PHARM D T.JACKIE AT [id number] ON 02/28/2021 BY T.SAAD.    Staphylococcus lugdunensis NOT DETECTED NOT DETECTED Final   Streptococcus species NOT DETECTED NOT DETECTED Final   Streptococcus agalactiae NOT DETECTED NOT DETECTED Final   Streptococcus pneumoniae NOT DETECTED NOT DETECTED Final   Streptococcus pyogenes NOT DETECTED NOT DETECTED Final   A.calcoaceticus-baumannii NOT DETECTED NOT DETECTED Final   Bacteroides fragilis NOT DETECTED NOT DETECTED Final   Enterobacterales NOT DETECTED NOT DETECTED Final   Enterobacter cloacae complex NOT DETECTED NOT DETECTED Final   Escherichia coli NOT DETECTED NOT DETECTED Final   Klebsiella aerogenes NOT DETECTED NOT DETECTED Final   Klebsiella oxytoca NOT DETECTED NOT DETECTED Final   Klebsiella pneumoniae NOT DETECTED NOT DETECTED Final   Proteus species NOT DETECTED NOT DETECTED Final   Salmonella species NOT DETECTED NOT DETECTED Final   Serratia marcescens NOT DETECTED NOT DETECTED Final   Haemophilus influenzae NOT DETECTED NOT DETECTED Final   Neisseria meningitidis NOT DETECTED NOT DETECTED Final   Pseudomonas aeruginosa NOT DETECTED NOT DETECTED Final   Stenotrophomonas maltophilia NOT DETECTED NOT DETECTED Final   Candida albicans NOT DETECTED NOT DETECTED Final   Candida auris NOT DETECTED NOT DETECTED Final   Candida glabrata NOT DETECTED NOT DETECTED Final   Candida krusei NOT DETECTED NOT DETECTED Final   Candida parapsilosis NOT DETECTED NOT DETECTED Final   Candida tropicalis NOT DETECTED NOT DETECTED Final   Cryptococcus neoformans/gattii NOT DETECTED NOT DETECTED Final   Methicillin resistance mecA/C DETECTED (A) NOT DETECTED Final    Comment: CRITICAL RESULT CALLED TO, READ BACK BY AND VERIFIED WITH: PHARM D T.JACKIE AT [id number] ON 02/28/2021 BY T.SAAD. Performed at [facility], [place] [address], [place],  [address]   Culture, blood (routine x 2)     Status: None (Preliminary result)   Collection Time: 02/27/21   8:57 AM   Specimen: BLOOD  RIGHT WRIST  Result Value Ref Range Status   Specimen Description BLOOD RIGHT WRIST  Final   Special Requests   Final    AEROBIC BOTTLE ONLY Blood Culture results may not be optimal due to an inadequate volume of blood received in culture bottles   Culture  Setup Time   Final    GRAM POSITIVE COCCI IN CLUSTERS AEROBIC BOTTLE ONLY CRITICAL VALUE NOTED.  VALUE IS CONSISTENT WITH PREVIOUSLY REPORTED AND CALLED VALUE.    Culture   Final    NO GROWTH < 24 HOURS Performed at [facility], [place] [address], [place], [place] [address]    Report Status PENDING  Incomplete     Janene Madeira, MSN, NP-C [place] for Infectious Disease [email or web address] Pager: [phone] Office: [phone] [place] Community: [phone]

## 2021-02-28 NOTE — Progress Notes (Signed)
Speech Language Pathology Treatment: Dysphagia  Patient Details Name: Joshua [name] MRN: [id number] DOB: July 26, 1981 Today's Date: 02/28/2021 Time: [id number] SLP Time Calculation (min) (ACUTE ONLY): 24 min  Assessment / Plan / Recommendation Clinical Impression  SLP follow up to determine if pt is accepting po intake and to attempt to determine strategies to assist automaticity.  Again, SLP attempted oral care with providing pt tootbrush/paste - he did not attempt to brush his teeth and removed paste from brush - orally holding and wincing.    Pt today did not accept intake of any solids however when Ensure mixed with icecream was provided with straw with max cues to place cup in his hand, encourage him to bring cup to mouth -  fading to min to consume after first bolus.  His acceptance of intake likely due to pt's pleasure with offering.   Pt readily consumed the entire contents of icecream mixed with 50% of Ensure.  He also consumed soda  - few sips only- via straw.  No indication of aspiration with po observed and swallow was not clinically prolonged/delayed.  Following intake - pt more willing to allow SLP to assist to brush his teeth.  Provided visual cues - mimicing - but pt did not brush his teeth.  He did allow SLP to place toothbrush in his mouth and brush left uppers for approx 10 seconds.    Encouraged nursing student to attempt to brush teeth as pt will allow and encourage his intake.  SlP questions if pt's poor intake is also due to his dsipleasure with diet given prolonged hospital coarse and lack of appetite with tube feeding.  Reviewed importance of oral care for pulmonary health and need for pt to consume po to decrease muscle atrophy.  Today pt was NO wincing with intake - encouraging.  Will follow up next week to attempt soft solids that pt may enjoy.      HPI HPI: HPI: Joshua [name] is a 40 y.o. 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 Presents today by EMS for concern for seizures. Patient at baseline is nonverbal with right hemiplegia and requires full assistance with ADLs, and is being cared for at home by family. CT of head without acute intracranial abnormality. 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 Chest x ray with mild hazy airspace opacity within the right mid and lower lung which could be due to atelectasis and/or infectious etiology.  Pt has been in hospital for 21 days - pt now has a PEG.      SLP Plan  Continue with current plan of care       Recommendations  Diet recommendations: Dysphagia 2 (fine chop);Thin liquid Liquids provided via: Straw Medication Administration: Via alternative means Supervision: Full supervision/cueing for compensatory strategies;Staff to assist with self feeding Compensations: Small sips/bites;Slow rate;Minimize environmental distractions;Follow solids with liquid Postural Changes and/or Swallow Maneuvers: Seated upright 90 degrees;Upright 30-60 min after meal                Oral Care Recommendations: Oral care BID Follow up Recommendations: 24 hour supervision/assistance SLP Visit Diagnosis: Dysphagia, oropharyngeal phase (R13.12) Plan: Continue with current plan of care       GO  Macario Golds 02/28/2021, 9:40 AM  Kathleen Lime, MS [facility] SLP Acute Rehab Services Office [phone] Pager [phone]

## 2021-02-28 NOTE — Progress Notes (Addendum)
[facility] PROGRESS NOTE  Joshua [name] [id number] DOB: 1981-06-16 DOA: 02/04/2021 PCP: Patient, [name]  Status: Remains inpatient appropriate because:Altered mental status, Unsafe d/c plan and Inpatient level of care appropriate due to severity of illness   Dispo: The patient is from: Home              Anticipated d/c is to: SNF              Patient currently is not medically stable to d/c.   Difficult to place patient Yes-needs LT SNF-has Medicaid appears to be active in [place] but and active in [place] /family would like to place him in [place] if possible; Pharmacotherapy has improved patient's behavioral response in context of acute anoxic brain injury.  As of 3/1 mittens are off.  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  3/4 patient spiked a fever during this past week.  Blood cultures 3/4 have grown methicillin-resistant staph epidermis without any definitive source found.  Pharmacist recommended IV vancomycin.  ID has been consulted.  Patient now is not medically stable for discharge until duration of antibiotic therapy clarified.  3/4 [facility] in [facility] have declined admission   Level of care: Telemetry Medical  Code Status: Full Family Communication: Mother on 3/4 by case manager Sharyn Lull.  Updated that [facility] denied patient for acceptance.  Aware that patient now staying in hospital as we clarify abnormal blood cultures and duration of therapy of IV antibiotics.  Patient's mom continues to agree to accept patient to the home environment once medically stable  3/4 updated mother regarding abnormal blood cultures and findings concerning for valvular endocarditis and need for TEE to clarify.  Mother verbalized understanding.  She was made aware that if truly does have endocarditis that patient will need to have a PICC line placed once follow-up blood cultures are negative  and he will discharge home for a total of up to 6 weeks of IV antibiotics. DVT prophylaxis: SCDs Vaccination status: 2/28 Spoke with his mother who agreed to Covid vaccine-order placed for Janssen vaccine that was given 2/28  Foley catheter: No  HPI: 40 year old male with a history of intracranial hemorrhage in 08/2019 requiring a hemicraniectomy and evacuation was discharged home in May 2021.  The patient has subsequently had multiple seizures with multiple ER visits for them. He presented on 02/04/2021 due to family's concern that he was having another seizure.  He had a leftward gaze which was noted in the ED as well.  Despite receiving Ativan, it did not improve and therefore he was given an IV Keppra load.   Subjective: Awake.  Tolerating repositioning and other bedside care by nursing staff.  Objective: Vitals:   02/28/21 0731 02/28/21 1058  BP: 106/82 (!) (P) 131/107  Pulse: 86 (P) 71  Resp: 14 (P) 16  Temp: 98 F (36.7 C) (P) 98 F (36.7 C)  SpO2: 99% (P) 100%    Intake/Output Summary (Last 24 hours) at 02/28/2021 1408 Last data filed at 02/28/2021 0600 Gross per 24 hour  Intake 120 ml  Output 900 ml  Net -780 ml   Filed Weights   02/25/21 0500 02/26/21 0500 02/27/21 0500  Weight: 46.8 kg 48.1 kg 50.4 kg    Exam:  Constitutional: Awake, calm, no acute distress Respiratory: Posterior lung sounds are clear to auscultation and he is stable on room air Cardiovascular: Normal heart sounds, extremities warm to touch, regular pulse, hemodynamically  stable Abdomen: Nontender.  Poor oral intake.  Bowel sounds present.  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 Appears to have normal tone of left upper extremity which he uses purposefully Neurologic: Cranial nerves II through XII appear to be grossly intact -moves left upper extremity with purpose dense hemiplegia right side Psychiatric: Awake,  nonverbal therefore unable to accurately assess orientation.  Have attempted on multiple occasions to get patient to nod or lift the finger to determine if he is understanding what is being spoken to him.  I suspect he has a degree of receptive aphasia as well as expressive aphasia influencing his ability to communicate.   Assessment/Plan: Acute problems: Seizure with new chronic encephalopathy/suspected anoxic brain injury -Likely secondary to prior brain injury related to intracranial hemorrhage -Prior to recent seizures and resultant brain injury he had been able to eat orally -EEG ordered but then canceled as patient was noncooperative -Continue Keppra 1 g twice daily- Neurology has signed off  -Added Seroquel 25 mg per tube twice daily on 2/25 with improvement in behaviors although nursing staff still says he becomes combative at times.  Will increase nocturnal dose to 50 mg. 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  Patient has no wounds, IV site unremarkable, PICC site unremarkable -ID consulted for duration of therapy recommendation -Given apparent staph bacteremia will obtain echocardiogram to rule out any potential vegetation UPDATE: ECHO reveals possible MV and Ao valve small vegetations-will need TEE  HTN with  H/o PRES - BP stable during the day but overnight SBP's over the past  several nights have been low in the 80s to 90s.  Suspect improved blood pressure readings as agitation improved with the initiation of Seroquel - continue Norvasc decreased to 5 mg daily, continue hydralazine and Metoprolol for now and can decrease dosages if blood pressure remains suboptimal  Severe protein-calorie malnutrition, underweight Dysphagia Body mass index is 13.1 kg/m. -  He has PEG tube and is tolerating tube feeds - also on D 2 diet with thin liquids but is not eating or drinking (declines to) - continue Prostat supplement  - I have been speaking with nursing about making sure the abdominal binder is covering his PEG to protect it. Nutrition Problem: Inadequate oral intake Etiology: dysphagia Signs/Symptoms: meal completion < 25% Interventions: Prostat,Tube feeding  -SLP reevaluated on 3/2.  Continue D2 diet with thin liquids but patient will also need soft hold finger foods that he can self-feed.  We will also need to have staff at bedside to feed patient otherwise until oral intake improves.  Other problems: CKD 3 - Creatinine stable   Data Reviewed: Basic Metabolic Panel: No results for input(s): NA, K, CL, CO2, GLUCOSE, BUN, CREATININE, CALCIUM, MG, PHOS in the last 168 hours. Liver Function Tests: No results for input(s): AST, ALT, ALKPHOS, BILITOT, PROT, ALBUMIN in the last 168 hours. No results for input(s): LIPASE, AMYLASE in the last 168 hours. No results for input(s):  AMMONIA in the last 168 hours. CBC: No results for input(s): WBC, NEUTROABS, HGB, HCT, MCV, PLT in the last 168 hours. Cardiac Enzymes: No results for input(s): CKTOTAL, CKMB, CKMBINDEX, TROPONINI in the last 168 hours. BNP (last 3 results) No results for input(s): BNP in the last 8760 hours.  ProBNP (last 3 results) No results for input(s): PROBNP in the last 8760 hours.  CBG: Recent Labs  Lab 02/27/21 2048 02/28/21 0015 02/28/21 0328 02/28/21 0940 02/28/21 1145  GLUCAP 107* 102*  118* 129* 121*    Recent Results (from the past 240 hour(s))  Culture, Urine     Status: Abnormal   Collection Time: 02/27/21  7:49 AM   Specimen: Urine, Catheterized  Result Value Ref Range Status   Specimen Description URINE, CATHETERIZED  Final   Special Requests   Final    NONE Performed at [facility], [place] [address], [place], [place] [address]    Culture MULTIPLE SPECIES PRESENT, SUGGEST RECOLLECTION (A)  Final   Report Status 02/28/2021 FINAL  Final  Culture, blood (routine x 2)     Status: None (Preliminary result)   Collection Time: 02/27/21  8:50 AM   Specimen: BLOOD RIGHT HAND  Result Value Ref Range Status   Specimen Description BLOOD RIGHT HAND  Final   Special Requests   Final    BOTTLES DRAWN AEROBIC AND ANAEROBIC Blood Culture results may not be optimal due to an inadequate volume of blood received in culture bottles   Culture  Setup Time   Final    GRAM POSITIVE COCCI IN CLUSTERS IN BOTH AEROBIC AND ANAEROBIC BOTTLES Organism ID to follow CRITICAL RESULT CALLED TO, READ BACK BY AND VERIFIED WITH: PHARMD T.JACKIE AT [id number] ON 02/28/2021 BY T.SAAD Performed at [facility], [place] [address], [place], [place] [address]    Culture GRAM POSITIVE COCCI IN CLUSTERS  Final   Report Status PENDING  Incomplete  Blood Culture ID Panel (Reflexed)     Status: Abnormal   Collection Time: 02/27/21  8:50 AM  Result Value Ref Range Status   Enterococcus faecalis NOT DETECTED NOT DETECTED Final   Enterococcus Faecium NOT DETECTED NOT DETECTED Final   Listeria monocytogenes NOT DETECTED NOT DETECTED Final   Staphylococcus species DETECTED (A) NOT DETECTED Final    Comment: CRITICAL RESULT CALLED TO, READ BACK BY AND VERIFIED WITH: PHARM D T.JACKIE AT [id number] ON 02/28/2021 BY T.SAAD.    Staphylococcus aureus (BCID) NOT DETECTED NOT DETECTED Final   Staphylococcus epidermidis DETECTED (A) NOT DETECTED Final    Comment: Methicillin (oxacillin) resistant coagulase negative  staphylococcus. Possible blood culture contaminant (unless isolated from more than one blood culture draw or clinical case suggests pathogenicity). No antibiotic treatment is indicated for blood  culture contaminants. CRITICAL RESULT CALLED TO, READ BACK BY AND VERIFIED WITH: PHARM D T.JACKIE AT [id number] ON 02/28/2021 BY T.SAAD.    Staphylococcus lugdunensis NOT DETECTED NOT DETECTED Final   Streptococcus species NOT DETECTED NOT DETECTED Final   Streptococcus agalactiae NOT DETECTED NOT DETECTED Final   Streptococcus pneumoniae NOT DETECTED NOT DETECTED Final   Streptococcus pyogenes NOT DETECTED NOT DETECTED Final   A.calcoaceticus-baumannii NOT DETECTED NOT DETECTED Final   Bacteroides fragilis NOT DETECTED NOT DETECTED Final   Enterobacterales NOT DETECTED NOT DETECTED Final   Enterobacter cloacae complex NOT DETECTED NOT DETECTED Final   Escherichia coli NOT DETECTED NOT DETECTED Final   Klebsiella aerogenes NOT DETECTED NOT DETECTED Final   Klebsiella oxytoca NOT DETECTED  NOT DETECTED Final   Klebsiella pneumoniae NOT DETECTED NOT DETECTED Final   Proteus species NOT DETECTED NOT DETECTED Final   Salmonella species NOT DETECTED NOT DETECTED Final   Serratia marcescens NOT DETECTED NOT DETECTED Final   Haemophilus influenzae NOT DETECTED NOT DETECTED Final   Neisseria meningitidis NOT DETECTED NOT DETECTED Final   Pseudomonas aeruginosa NOT DETECTED NOT DETECTED Final   Stenotrophomonas maltophilia NOT DETECTED NOT DETECTED Final   Candida albicans NOT DETECTED NOT DETECTED Final   Candida auris NOT DETECTED NOT DETECTED Final   Candida glabrata NOT DETECTED NOT DETECTED Final   Candida krusei NOT DETECTED NOT DETECTED Final   Candida parapsilosis NOT DETECTED NOT DETECTED Final   Candida tropicalis NOT DETECTED NOT DETECTED Final   Cryptococcus neoformans/gattii NOT DETECTED NOT DETECTED Final   Methicillin resistance mecA/C DETECTED (A) NOT DETECTED Final    Comment: CRITICAL  RESULT CALLED TO, READ BACK BY AND VERIFIED WITH: PHARM D T.JACKIE AT [id number] ON 02/28/2021 BY T.SAAD. Performed at [facility], [place] [address] [place], [place] [address]   Culture, blood (routine x 2)     Status: None (Preliminary result)   Collection Time: 02/27/21  8:57 AM   Specimen: BLOOD RIGHT WRIST  Result Value Ref Range Status   Specimen Description BLOOD RIGHT WRIST  Final   Special Requests   Final    AEROBIC BOTTLE ONLY Blood Culture results may not be optimal due to an inadequate volume of blood received in culture bottles   Culture  Setup Time   Final    GRAM POSITIVE COCCI IN CLUSTERS AEROBIC BOTTLE ONLY CRITICAL VALUE NOTED.  VALUE IS CONSISTENT WITH PREVIOUSLY REPORTED AND CALLED VALUE.    Culture   Final    NO GROWTH < 24 HOURS Performed at [facility], [place] [address] [place],  [address]    Report Status PENDING  Incomplete     Studies: DG CHEST PORT 1 VIEW  Result Date: 02/27/2021 CLINICAL DATA:  Fever EXAM: PORTABLE CHEST 1 VIEW COMPARISON:  02/04/2021 FINDINGS: Minimal scarring or atelectasis at the left base. There is no edema, consolidation, effusion, or pneumothorax. Normal heart size for technique. Unremarkable aortic and hilar contours. IMPRESSION: No focal pneumonia. Electronically Signed   By: Monte Fantasia M.D.   On: 02/27/2021 10:56   ECHOCARDIOGRAM COMPLETE  Result Date: 02/28/2021    ECHOCARDIOGRAM REPORT   Patient Name:   LATHAM Dralle Date of Exam: 02/28/2021 Medical Rec #:  [id number]     Height:       72.0 in Accession #:    [id number]    Weight:       111.1 lb Date of Birth:  [birth year]    BSA:          1.660 m Patient Age:    40 years      BP:           106/82 mmHg Patient Gender: M             HR:           76 bpm. Exam Location:  Inpatient Procedure: 2D Echo, 3D Echo, Cardiac Doppler, Color Doppler and Strain Analysis Indications:    Bacteremia  History:        Patient has prior history of Echocardiogram examinations, most                  recent 09/16/2019.  Sonographer:    [place] Referring  Phys: [id number] Samella Parr  Sonographer Comments: Difficult to assess short axis due to patient combativenes. Suspicious for vegetation on MV. IMPRESSIONS  1. Limited study that was ended early due to patient combativeness  2. Left ventricular ejection fraction, by estimation, is 70 to 75%. The left ventricle has hyperdynamic function. The left ventricle has no regional wall motion abnormalities. There is mild left ventricular hypertrophy. Left ventricular diastolic function could not be evaluated.  3. Right ventricular systolic function is normal. The right ventricular size is normal.  4. There is thickening of the anterior mitral valve leaflet with possible small vegetation.. The mitral valve is abnormal. Trivial mitral valve regurgitation.  5. The aortic valve appears thickened and echogenic and there may be associated vegetation. The aortic valve is abnormal. Aortic valve regurgitation is not visualized.  6. The inferior vena cava is normal in size with greater than 50% respiratory variability, suggesting right atrial pressure of 3 mmHg. Comparison(s): Changes from prior study are noted. 09/16/2019: LVEF 70-75%, mild MR. Conclusion(s)/Recommendation(s): Findings concerning for mitral and possibly aortic valve vegetations, would recommend Transesophageal Echocardiogram for further evaluation. FINDINGS  Left Ventricle: Left ventricular ejection fraction, by estimation, is 70 to 75%. The left ventricle has hyperdynamic function. The left ventricle has no regional wall motion abnormalities. The left ventricular internal cavity size was normal in size. There is mild left ventricular hypertrophy. Left ventricular diastolic function could not be evaluated. Right Ventricle: The right ventricular size is normal. No increase in right ventricular wall thickness. Right ventricular systolic function is normal. Left Atrium: Left atrial size was normal in  size. Right Atrium: Right atrial size was normal in size. Pericardium: There is no evidence of pericardial effusion. Mitral Valve: There is thickening of the anterior mitral valve leaflet with possible small vegetation. The mitral valve is abnormal. Trivial mitral valve regurgitation. Tricuspid Valve: The tricuspid valve is not well visualized. Tricuspid valve regurgitation is not demonstrated. Aortic Valve: The aortic valve appears thickened and echogenic and there may be associated vegetation. The aortic valve is abnormal. Aortic valve regurgitation is not visualized. Pulmonic Valve: The pulmonic valve was not well visualized. Pulmonic valve regurgitation is not visualized. Aorta: The aortic root and ascending aorta are structurally normal, with no evidence of dilitation. Venous: The inferior vena cava is normal in size with greater than 50% respiratory variability, suggesting right atrial pressure of 3 mmHg. IAS/Shunts: No atrial level shunt detected by color flow Doppler.  LEFT VENTRICLE PLAX 2D LVIDd:         4.00 cm LVIDs:         2.30 cm LV PW:         1.20 cm LV IVS:        1.40 cm LVOT diam:     2.10 cm LVOT Area:     3.46 cm  RIGHT VENTRICLE RV S prime:     15.60 cm/s TAPSE (M-mode): 2.5 cm LEFT ATRIUM         Index      RIGHT ATRIUM          Index LA diam:    1.70 cm 1.02 cm/m RA Area:     9.71 cm                                RA Volume:   17.10 ml 10.30 ml/m   AORTA Ao Root diam: 4.20 cm Ao Asc diam:  3.20 cm  SHUNTS Systemic Diam: 2.10 cm Lyman Bishop MD Electronically signed by Lyman Bishop MD Signature Date/Time: 02/28/2021/11:56:30 AM    Final     Scheduled Meds: . amLODipine  5 mg Per Tube q AM  . feeding supplement (PROSource TF)  45 mL Per Tube BID  . free water  200 mL Per Tube Q4H  . hydrALAZINE  25 mg Per Tube Q8H  . levETIRAcetam  1,000 mg Per Tube Q12H  . metoprolol tartrate  50 mg Per Tube BID  . naltrexone  25 mg Per Tube Daily  . QUEtiapine  25 mg Per Tube Daily  .  QUEtiapine  50 mg Per Tube QHS   Continuous Infusions: . feeding supplement (OSMOLITE 1.5 CAL) 1,000 mL (02/28/21 0426)  . 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   Essential hypertension   Encephalopathy chronic   Staphylococcus epidermidis bacteremia   Bacteremia due to methicillin resistant Staphylococcus epidermidis   Aortic valve endocarditis   Vegetative endocarditis of mitral valve   Consultants:  Neurology  Procedures:  None  Antibiotics: Anti-infectives (From admission, onward)   Start     Dose/Rate Route Frequency Ordered Stop   03/01/21 1000  vancomycin (VANCOREADY) IVPB 1000 mg/200 mL        1,000 mg 200 mL/hr over 60 Minutes Intravenous Every 24 hours 02/28/21 0907     02/28/21 1000  vancomycin (VANCOREADY) IVPB 1250 mg/250 mL        1,250 mg 166.7 mL/hr over 90 Minutes Intravenous  Once 02/28/21 0907 02/28/21 1214       Time spent: 20 minutes    Erin Hearing ANP  [facility] 7 am - 330 pm/M-F for direct patient care and secure chat Please refer to Amion for contact info 23  days

## 2021-02-28 NOTE — Progress Notes (Signed)
CSW spoke with admissions at [facility] in [place], [place] - unfortunately the facility cannot offer a bed.  TOC staff to speak with patient's family again about a discharge plan.  Patient to receive IV antibiotics for infection - timeline unknown at this time.  [name], MSW, LCSW Transitions of Care  Clinical Social Worker II [phone]

## 2021-02-28 NOTE — Progress Notes (Signed)
Pharmacy Antibiotic Note  Joshua [name] is a 40 y.o. male admitted on 02/04/2021 with bacteremia.  Pharmacy has been consulted for vancomycin dosing.  Patient with continued AMS and nonverbal 2/2 multiple seizures. Currently afebrile; fever earlier this week but also received COVID vaccine on 2/28. CRP on 3/3 elevated at 1.5. Bcx showing Staph epi in 3/4 bottles, methicillin resistance detected. Source of infection unclear at this time  Plan: Vancomycin '1250mg'$  IV x 1, then '1000mg'$  IV q24h for Scr 1.32, estimated AUC 574, trough of 13) Monitor clinical course, LOT, and deecalation  Height: 6' (182.9 cm) Weight: 50.4 kg (111 lb 1.8 oz) IBW/kg (Calculated) : 77.6  Temp (24hrs), Avg:98.4 F (36.9 C), Min:98 F (36.7 C), Max:99 F (37.2 C)  No results for input(s): WBC, CREATININE, LATICACIDVEN, VANCOTROUGH, VANCOPEAK, VANCORANDOM, GENTTROUGH, GENTPEAK, GENTRANDOM, TOBRATROUGH, TOBRAPEAK, TOBRARND, AMIKACINPEAK, AMIKACINTROU, AMIKACIN in the last 168 hours.  Estimated Creatinine Clearance: 52.5 mL/min (A) (by C-G formula based on SCr of 1.32 mg/dL (H)).    No Known Allergies  Antimicrobials this admission: 3/4 vancomycin >>    Dose adjustments this admission: n/a  Microbiology results: 3/4  BCx: CNS 3/4 bottles   [name], PharmD, BCPS, FNKF Clinical Pharmacist [place] Please utilize Amion for appropriate phone number to reach the unit pharmacist ([place])   02/28/2021 9:03 AM

## 2021-02-28 NOTE — Care Management (Cosign Needed)
    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

## 2021-02-28 NOTE — Progress Notes (Signed)
  Echocardiogram 2D Echocardiogram has been performed.  Luisa Hart RDCS 02/28/2021, 11:39 AM

## 2021-02-28 NOTE — Progress Notes (Signed)
PHARMACY - PHYSICIAN COMMUNICATION CRITICAL VALUE ALERT - BLOOD CULTURE IDENTIFICATION (BCID)  Joshua [name] is an 40 y.o. male who presented to [facility] on 02/04/2021 with a chief complaint of seizure.   Assessment:  Patient with continued AMS and nonverbal 2/2 multiple seizures. Currently afebrile; fever earlier this week but also received COVID vaccine on 2/28. CRP on 3/3 elevated at 1.5. Bcx showing Staph epi in 3/4 bottles, methicillin resistance detected. Source of infection unclear at this time.    Name of physician (or Provider) Contacted: Erin Hearing, ANP   Current antibiotics: none  Changes to prescribed antibiotics recommended: Plan to start vancomycin pending further workup and identification of possible infection source.   Results for orders placed or performed during the hospital encounter of 02/04/21  Blood Culture ID Panel (Reflexed) (Collected: 02/27/2021  8:50 AM)  Result Value Ref Range   Enterococcus faecalis NOT DETECTED NOT DETECTED   Enterococcus Faecium NOT DETECTED NOT DETECTED   Listeria monocytogenes NOT DETECTED NOT DETECTED   Staphylococcus species DETECTED (A) NOT DETECTED   Staphylococcus aureus (BCID) NOT DETECTED NOT DETECTED   Staphylococcus epidermidis DETECTED (A) NOT DETECTED   Staphylococcus lugdunensis NOT DETECTED NOT DETECTED   Streptococcus species NOT DETECTED NOT DETECTED   Streptococcus agalactiae NOT DETECTED NOT DETECTED   Streptococcus pneumoniae NOT DETECTED NOT DETECTED   Streptococcus pyogenes NOT DETECTED NOT DETECTED   A.calcoaceticus-baumannii NOT DETECTED NOT DETECTED   Bacteroides fragilis NOT DETECTED NOT DETECTED   Enterobacterales NOT DETECTED NOT DETECTED   Enterobacter cloacae complex NOT DETECTED NOT DETECTED   Escherichia coli NOT DETECTED NOT DETECTED   Klebsiella aerogenes NOT DETECTED NOT DETECTED   Klebsiella oxytoca NOT DETECTED NOT DETECTED   Klebsiella pneumoniae NOT DETECTED NOT DETECTED   Proteus species NOT  DETECTED NOT DETECTED   Salmonella species NOT DETECTED NOT DETECTED   Serratia marcescens NOT DETECTED NOT DETECTED   Haemophilus influenzae NOT DETECTED NOT DETECTED   Neisseria meningitidis NOT DETECTED NOT DETECTED   Pseudomonas aeruginosa NOT DETECTED NOT DETECTED   Stenotrophomonas maltophilia NOT DETECTED NOT DETECTED   Candida albicans NOT DETECTED NOT DETECTED   Candida auris NOT DETECTED NOT DETECTED   Candida glabrata NOT DETECTED NOT DETECTED   Candida krusei NOT DETECTED NOT DETECTED   Candida parapsilosis NOT DETECTED NOT DETECTED   Candida tropicalis NOT DETECTED NOT DETECTED   Cryptococcus neoformans/gattii NOT DETECTED NOT DETECTED   Methicillin resistance mecA/C DETECTED (A) NOT DETECTED    Claudina Lick, PharmD PGY1 Acute Care Pharmacy Resident 02/28/2021 8:52 AM  Please check [email or web address] for unit-specific pharmacy phone numbers.

## 2021-03-01 LAB — CBC
HCT: 37.6 % — ABNORMAL LOW (ref 39.0–52.0)
Hemoglobin: 12.2 g/dL — ABNORMAL LOW (ref 13.0–17.0)
MCH: 26.3 pg (ref 26.0–34.0)
MCHC: 32.4 g/dL (ref 30.0–36.0)
MCV: 81.2 fL (ref 80.0–100.0)
Platelets: 235 10*3/uL (ref 150–400)
RBC: 4.63 MIL/uL (ref 4.22–5.81)
RDW: 15.2 % (ref 11.5–15.5)
WBC: 5.4 10*3/uL (ref 4.0–10.5)
nRBC: 0 % (ref 0.0–0.2)

## 2021-03-01 LAB — COMPREHENSIVE METABOLIC PANEL
ALT: 14 U/L (ref 0–44)
AST: 16 U/L (ref 15–41)
Albumin: 3.1 g/dL — ABNORMAL LOW (ref 3.5–5.0)
Alkaline Phosphatase: 60 U/L (ref 38–126)
Anion gap: 7 (ref 5–15)
BUN: 25 mg/dL — ABNORMAL HIGH (ref 6–20)
CO2: 27 mmol/L (ref 22–32)
Calcium: 9.4 mg/dL (ref 8.9–10.3)
Chloride: 102 mmol/L (ref 98–111)
Creatinine, Ser: 1.33 mg/dL — ABNORMAL HIGH (ref 0.61–1.24)
GFR, Estimated: >60 mL/min (ref >60)
Glucose, Bld: 117 mg/dL — ABNORMAL HIGH (ref 70–99)
Potassium: 4.1 mmol/L (ref 3.5–5.1)
Sodium: 136 mmol/L (ref 135–145)
Total Bilirubin: 0.6 mg/dL (ref 0.3–1.2)
Total Protein: 6.6 g/dL (ref 6.5–8.1)

## 2021-03-01 LAB — GLUCOSE, CAPILLARY
Glucose-Capillary: 100 mg/dL — ABNORMAL HIGH (ref 70–99)
Glucose-Capillary: 115 mg/dL — ABNORMAL HIGH (ref 70–99)
Glucose-Capillary: 122 mg/dL — ABNORMAL HIGH (ref 70–99)
Glucose-Capillary: 122 mg/dL — ABNORMAL HIGH (ref 70–99)
Glucose-Capillary: 88 mg/dL (ref 70–99)
Glucose-Capillary: 97 mg/dL (ref 70–99)

## 2021-03-01 LAB — CULTURE, BLOOD (ROUTINE X 2)

## 2021-03-01 MED ORDER — TRAZODONE HCL 50 MG PO TABS
50.0000 mg | ORAL_TABLET | Freq: Every evening | ORAL | Status: DC | PRN
  Administered 2021-03-02: 50 mg via ORAL
  Filled 2021-03-01: qty 1

## 2021-03-01 NOTE — Plan of Care (Signed)

## 2021-03-01 NOTE — Progress Notes (Signed)
PROGRESS NOTE  Joshua [name] [id number] DOB: 10-04-1981 DOA: 02/04/2021 PCP: Patient, [name]   LOS: 24 days   Brief Narrative / Interim history: 40 year old male with a history of intracranial hemorrhage in 08/2019 requiring a hemicraniectomy and evacuation was discharged home in May 2021. The patient has subsequently had multiple seizures with multiple ER visits for them. He presented on 02/04/2021 due to family's concern that he was having another seizure. He had a leftward gaze which was noted in the ED as well. 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  Will increase nocturnal dose to 50 mg. 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  ID consulted.  Underwent a 2D echo which was initially read as having vegetations however upon discussion with cardiology by ID there was actually no  evidence for vegetations on TEE, recommending only 7 days of IV vancomycin at this point.  Vancomycin was started on 3/4, today day #2/7  HTN with H/o PRES - BP stable during the day but overnight SBP's over the past several nights have been low in the 80s to 90s.  Suspect improved blood pressure readings as agitation improved with the initiation of Seroquel - continue Norvasc decreased to 5 mg daily, continue hydralazine and Metoprolol for now and can decrease dosages if blood pressure remains suboptimal  Severe protein-calorie malnutrition, underweight Dysphagia Body mass index is 13.1 kg/m. - He has PEG tube and is tolerating tube feeds - also on D 2 diet with thin liquids but is not eating or drinking (declines to) - continue Prostat supplement  - I have been speaking with nursing about making sure the abdominal binder is covering his PEG to protect it. Nutrition Problem: Inadequate oral intake Etiology: dysphagia Signs/Symptoms: meal completion < 25% Interventions: Prostat,Tube feeding  -SLP reevaluated on 3/2.  Continue D2 diet with thin liquids but patient will also need soft hold finger foods that he can self-feed.  We will also need to have staff at bedside to feed patient otherwise until oral intake improves.  Other problems: CKD 3 - Creatinine stable  Scheduled Meds: . amLODipine  5 mg Per Tube q AM  . feeding supplement (PROSource TF)  45 mL Per Tube BID  . free water  200 mL Per Tube Q4H  . hydrALAZINE  25 mg Per Tube Q8H  . levETIRAcetam  1,000 mg Per Tube Q12H  . metoprolol tartrate  50 mg Per Tube BID  . naltrexone  25 mg Per Tube Daily  . QUEtiapine  25 mg Per Tube Daily  .  QUEtiapine  50 mg Per Tube QHS   Continuous Infusions: . feeding supplement (OSMOLITE 1.5 CAL) 1,000 mL (02/28/21 2228)  . vancomycin     PRN Meds:.acetaminophen, haloperidol lactate, hydrALAZINE  Diet Orders (From admission, onward)    Start     Ordered   02/06/21 1004  DIET DYS 2  Room service appropriate? Yes; Fluid consistency: Thin  Diet effective now       Comments: Meds crushed via tube (family was doing this at home) full supervision  Question Answer Comment  Room service appropriate? Yes   Fluid consistency: Thin      02/06/21 1004          DVT prophylaxis: SCDs Start: 02/05/21 [id number]     Code Status: Full Code  Family Communication: no family at bedside   Status is: Inpatient  Remains inpatient appropriate because:IV treatments appropriate due to intensity of illness or inability to take PO   Dispo: The patient is from: Home              Anticipated d/c is to: TBD              Patient currently is not medically stable to d/c.   Difficult to place patient Yes  Level of care: Telemetry Medical  Consultants:  ID Neurology   Procedures:  None   Microbiology  Staph epi positive blood cultures  Antimicrobials: vancomyin 3/4 >>    Objective: Vitals:   03/07/2021 0004 03/07/2021 0413 03-07-21 0415 03-07-21 0537  BP: 108/72 118/83  112/71  Pulse: 74 84  73  Resp: 15 18    Temp: 97.7 F (36.5 C) 98 F (36.7 C)    TempSrc: Oral Oral    SpO2: 98%  98%   Weight:      Height:        Intake/Output Summary (Last 24 hours) at 03/07/2021 0944 Last data filed at March 07, 2021 0537 Gross per 24 hour  Intake -  Output 600 ml  Net -600 ml   Filed Weights   02/25/21 0500 02/26/21 0500 02/27/21 0500  Weight: 46.8 kg 48.1 kg 50.4 kg    Examination:  Constitutional: NAD Eyes: no scleral icterus ENMT: Mucous membranes are moist.  Neck: normal, supple Respiratory: clear to auscultation bilaterally, no wheezing, no crackles.  Cardiovascular: Regular rate and rhythm, no murmurs / rubs / gallops.  Abdomen: non distended, no tenderness. Bowel sounds positive.  Musculoskeletal: no clubbing / cyanosis.  Skin: no rashes Neurologic: Does not engage in exam   Data Reviewed: I have independently reviewed following labs and imaging studies    CBC: Recent Labs  Lab 2021-03-07 0225  WBC 5.4  HGB 12.2*  HCT 37.6*  MCV 81.2  PLT [id number]   Basic Metabolic Panel: Recent Labs  Lab Mar 07, 2021 0225  NA 136  K 4.1  CL 102  CO2 27  GLUCOSE 117*  BUN 25*  CREATININE 1.33*  CALCIUM 9.4   Liver Function Tests: Recent Labs  Lab 03-07-21 0225  AST 16  ALT 14  ALKPHOS 60  BILITOT 0.6  PROT 6.6  ALBUMIN 3.1*   Coagulation Profile: No results for input(s): INR, PROTIME in the last 168 hours. HbA1C: No results for input(s): HGBA1C in the last 72 hours. CBG: Recent Labs  Lab 02/28/21 1552 02/28/21 1953 03-07-21 0003 2021/03/07 0412 2021-03-07 0722  GLUCAP 116* 104* 100* 122* 115*    Recent Results (from the past 240 hour(s))  Culture, Urine     Status: Abnormal  Collection Time: 02/27/21  7:49 AM   Specimen: Urine, Catheterized  Result Value Ref Range Status   Specimen Description URINE, CATHETERIZED  Final   Special Requests   Final    NONE Performed at [facility], [address], [place], [place] [address]    Culture MULTIPLE SPECIES PRESENT, SUGGEST RECOLLECTION (A)  Final   Report Status 02/28/2021 FINAL  Final  Culture, blood (routine x 2)     Status: None (Preliminary result)   Collection Time: 02/27/21  8:50 AM   Specimen: BLOOD RIGHT HAND  Result Value Ref Range Status   Specimen Description BLOOD RIGHT HAND  Final   Special Requests   Final    BOTTLES DRAWN AEROBIC AND ANAEROBIC Blood Culture results may not be optimal due to an inadequate volume of blood received in culture bottles   Culture  Setup Time   Final    GRAM POSITIVE COCCI IN CLUSTERS IN BOTH AEROBIC AND ANAEROBIC BOTTLES Organism ID to follow CRITICAL RESULT CALLED TO, READ BACK BY AND VERIFIED WITH: PHARMD T.JACKIE AT [id number] ON 02/28/2021 BY T.SAAD Performed at [facility], [place] [address] [place], [place] [address]    Culture GRAM POSITIVE COCCI IN CLUSTERS  Final   Report Status PENDING  Incomplete  Blood Culture ID  Panel (Reflexed)     Status: Abnormal   Collection Time: 02/27/21  8:50 AM  Result Value Ref Range Status   Enterococcus faecalis NOT DETECTED NOT DETECTED Final   Enterococcus Faecium NOT DETECTED NOT DETECTED Final   Listeria monocytogenes NOT DETECTED NOT DETECTED Final   Staphylococcus species DETECTED (A) NOT DETECTED Final    Comment: CRITICAL RESULT CALLED TO, READ BACK BY AND VERIFIED WITH: PHARM D T.JACKIE AT [id number] ON 02/28/2021 BY T.SAAD.    Staphylococcus aureus (BCID) NOT DETECTED NOT DETECTED Final   Staphylococcus epidermidis DETECTED (A) NOT DETECTED Final    Comment: Methicillin (oxacillin) resistant coagulase negative staphylococcus. Possible blood culture contaminant (unless isolated from more than one blood culture draw or clinical case suggests pathogenicity). No antibiotic treatment is indicated for blood  culture contaminants. CRITICAL RESULT CALLED TO, READ BACK BY AND VERIFIED WITH: PHARM D T.JACKIE AT [id number] ON 02/28/2021 BY T.SAAD.    Staphylococcus lugdunensis NOT DETECTED NOT DETECTED Final   Streptococcus species NOT DETECTED NOT DETECTED Final   Streptococcus agalactiae NOT DETECTED NOT DETECTED Final   Streptococcus pneumoniae NOT DETECTED NOT DETECTED Final   Streptococcus pyogenes NOT DETECTED NOT DETECTED Final   A.calcoaceticus-baumannii NOT DETECTED NOT DETECTED Final   Bacteroides fragilis NOT DETECTED NOT DETECTED Final   Enterobacterales NOT DETECTED NOT DETECTED Final   Enterobacter cloacae complex NOT DETECTED NOT DETECTED Final   Escherichia coli NOT DETECTED NOT DETECTED Final   Klebsiella aerogenes NOT DETECTED NOT DETECTED Final   Klebsiella oxytoca NOT DETECTED NOT DETECTED Final   Klebsiella pneumoniae NOT DETECTED NOT DETECTED Final   Proteus species NOT DETECTED NOT DETECTED Final   Salmonella species NOT DETECTED NOT DETECTED Final   Serratia marcescens NOT DETECTED NOT DETECTED Final   Haemophilus influenzae NOT DETECTED NOT DETECTED  Final   Neisseria meningitidis NOT DETECTED NOT DETECTED Final   Pseudomonas aeruginosa NOT DETECTED NOT DETECTED Final   Stenotrophomonas maltophilia NOT DETECTED NOT DETECTED Final   Candida albicans NOT DETECTED NOT DETECTED Final   Candida auris NOT DETECTED NOT DETECTED Final   Candida glabrata NOT DETECTED NOT DETECTED Final   Candida krusei NOT DETECTED NOT DETECTED  Final   Candida parapsilosis NOT DETECTED NOT DETECTED Final   Candida tropicalis NOT DETECTED NOT DETECTED Final   Cryptococcus neoformans/gattii NOT DETECTED NOT DETECTED Final   Methicillin resistance mecA/C DETECTED (A) NOT DETECTED Final    Comment: CRITICAL RESULT CALLED TO, READ BACK BY AND VERIFIED WITH: PHARM D T.JACKIE AT [id number] ON 02/28/2021 BY T.SAAD. Performed at [facility], [place] [address] [place], [place] [address]   Culture, blood (routine x 2)     Status: None (Preliminary result)   Collection Time: 02/27/21  8:57 AM   Specimen: BLOOD RIGHT WRIST  Result Value Ref Range Status   Specimen Description BLOOD RIGHT WRIST  Final   Special Requests   Final    AEROBIC BOTTLE ONLY Blood Culture results may not be optimal due to an inadequate volume of blood received in culture bottles   Culture  Setup Time   Final    GRAM POSITIVE COCCI IN CLUSTERS AEROBIC BOTTLE ONLY CRITICAL VALUE NOTED.  VALUE IS CONSISTENT WITH PREVIOUSLY REPORTED AND CALLED VALUE.    Culture   Final    NO GROWTH 2 DAYS Performed at [facility], [place] [address], [place], [place] [address]    Report Status PENDING  Incomplete     Radiology Studies: ECHOCARDIOGRAM COMPLETE  Result Date: 02/28/2021    ECHOCARDIOGRAM REPORT   Patient Name:   MARQUI Moronta Date of Exam: 02/28/2021 Medical Rec #:  [id number]     Height:       72.0 in Accession #:    [id number]    Weight:       111.1 lb Date of Birth:  Apr 25, 1981    BSA:          1.660 m Patient Age:    19 years      BP:           106/82 mmHg Patient Gender: M             HR:            76 bpm. Exam Location:  Inpatient Procedure: 2D Echo, 3D Echo, Cardiac Doppler, Color Doppler and Strain Analysis Indications:    Bacteremia  History:        Patient has prior history of Echocardiogram examinations, most                 recent 09/16/2019.  Sonographer:    Luisa Hart RDCS Referring Phys: [id number] Leisa Lenz ELLIS  Sonographer Comments: Difficult to assess short axis due to patient combativenes. Suspicious for vegetation on MV. IMPRESSIONS  1. Limited study that was ended early due to patient combativeness  2. Left ventricular ejection fraction, by estimation, is 70 to 75%. The left ventricle has hyperdynamic function. The left ventricle has no regional wall motion abnormalities. There is mild left ventricular hypertrophy. Left ventricular diastolic function could not be evaluated.  3. Right ventricular systolic function is normal. The right ventricular size is normal.  4. There is thickening of the anterior mitral valve leaflet with possible small vegetation.. The mitral valve is abnormal. Trivial mitral valve regurgitation.  5. The aortic valve appears thickened and echogenic and there may be associated vegetation. The aortic valve is abnormal. Aortic valve regurgitation is not visualized.  6. The inferior vena cava is normal in size with greater than 50% respiratory variability, suggesting right atrial pressure of 3 mmHg. Comparison(s): Changes from prior study are noted. 09/16/2019: LVEF 70-75%, mild MR. Conclusion(s)/Recommendation(s): Findings concerning for  mitral and possibly aortic valve vegetations, would recommend Transesophageal Echocardiogram for further evaluation. FINDINGS  Left Ventricle: Left ventricular ejection fraction, by estimation, is 70 to 75%. The left ventricle has hyperdynamic function. The left ventricle has no regional wall motion abnormalities. The left ventricular internal cavity size was normal in size. There is mild left ventricular hypertrophy. Left ventricular  diastolic function could not be evaluated. Right Ventricle: The right ventricular size is normal. No increase in right ventricular wall thickness. Right ventricular systolic function is normal. Left Atrium: Left atrial size was normal in size. Right Atrium: Right atrial size was normal in size. Pericardium: There is no evidence of pericardial effusion. Mitral Valve: There is thickening of the anterior mitral valve leaflet with possible small vegetation. The mitral valve is abnormal. Trivial mitral valve regurgitation. Tricuspid Valve: The tricuspid valve is not well visualized. Tricuspid valve regurgitation is not demonstrated. Aortic Valve: The aortic valve appears thickened and echogenic and there may be associated vegetation. The aortic valve is abnormal. Aortic valve regurgitation is not visualized. Pulmonic Valve: The pulmonic valve was not well visualized. Pulmonic valve regurgitation is not visualized. Aorta: The aortic root and ascending aorta are structurally normal, with no evidence of dilitation. Venous: The inferior vena cava is normal in size with greater than 50% respiratory variability, suggesting right atrial pressure of 3 mmHg. IAS/Shunts: No atrial level shunt detected by color flow Doppler.  LEFT VENTRICLE PLAX 2D LVIDd:         4.00 cm LVIDs:         2.30 cm LV PW:         1.20 cm LV IVS:        1.40 cm LVOT diam:     2.10 cm LVOT Area:     3.46 cm  RIGHT VENTRICLE RV S prime:     15.60 cm/s TAPSE (M-mode): 2.5 cm LEFT ATRIUM         Index      RIGHT ATRIUM          Index LA diam:    1.70 cm 1.02 cm/m RA Area:     9.71 cm                                RA Volume:   17.10 ml 10.30 ml/m   AORTA Ao Root diam: 4.20 cm Ao Asc diam:  3.20 cm  SHUNTS Systemic Diam: 2.10 cm Lyman Bishop MD Electronically signed by Lyman Bishop MD Signature Date/Time: 02/28/2021/11:56:30 AM    Final    Marzetta Board, MD, PhD [facility]  Between 7 am - 7 pm I am available, please contact me via Amion or  Securechat  Between 7 pm - 7 am I am not available, please contact night coverage MD/APP via Amion

## 2021-03-02 LAB — GLUCOSE, CAPILLARY
Glucose-Capillary: 102 mg/dL — ABNORMAL HIGH (ref 70–99)
Glucose-Capillary: 112 mg/dL — ABNORMAL HIGH (ref 70–99)
Glucose-Capillary: 114 mg/dL — ABNORMAL HIGH (ref 70–99)
Glucose-Capillary: 88 mg/dL (ref 70–99)

## 2021-03-02 NOTE — Plan of Care (Signed)

## 2021-03-02 NOTE — Progress Notes (Signed)
PROGRESS NOTE  Ishmael Paller [id number] DOB: June 04, 1981 DOA: 02/04/2021 PCP: Patient, [name]   LOS: 25 days   Brief Narrative / Interim history: 40 year old male with a history of intracranial hemorrhage in 08/2019 requiring a hemicraniectomy and evacuation was discharged home in May 2021. The patient has subsequently had multiple seizures with multiple ER visits for them. He presented on 02/04/2021 due to family's concern that he was having another seizure. He had a leftward gaze which was noted in the ED as well. 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  Will increase nocturnal dose to 50 mg. 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  ID consulted.  Underwent a 2D echo which was initially read as having vegetations however upon discussion with cardiology by ID there was actually no evidence for vegetations on TEE,  recommending only 7 days of IV vancomycin at this point.  Vancomycin was started on 3/4, today is day 3/7  HTN with H/o PRES -Blood pressure normalized, intermittently soft but asymptomatic - continue Norvasc decreased to 5 mg daily, continue hydralazine and Metoprolol for now and can decrease dosages if blood pressure remains suboptimal  Severe protein-calorie malnutrition, underweight Dysphagia Body mass index is 13.1 kg/m. - He has PEG tube and is tolerating tube feeds - also on D 2 diet with thin liquids but is not eating or drinking (declines to) - continue Prostat supplement  - I have been speaking with nursing about making sure the abdominal binder is covering his PEG to protect it. Nutrition Problem: Inadequate oral intake Etiology: dysphagia Signs/Symptoms: meal completion < 25% Interventions: Prostat,Tube feeding  -SLP reevaluated on 3/2.  Continue D2 diet with thin liquids but patient will also need soft hold finger foods that he can self-feed.  We will also need to have staff at bedside to feed patient otherwise until oral intake improves.  Other problems: CKD 3 - Creatinine stable  Scheduled Meds: . amLODipine  5 mg Per Tube q AM  . feeding supplement (PROSource TF)  45 mL Per Tube BID  . free water  200 mL Per Tube Q4H  . hydrALAZINE  25 mg Per Tube Q8H  . levETIRAcetam  1,000 mg Per Tube Q12H  . metoprolol tartrate  50 mg Per Tube BID  . naltrexone  25 mg Per Tube Daily  . QUEtiapine  25 mg Per Tube Daily  . QUEtiapine  50 mg Per Tube QHS   Continuous Infusions: . feeding supplement (OSMOLITE 1.5 CAL) 1,000 mL (02/28/21 2228)  . vancomycin 1,000 mg (03/01/21 1555)  PRN Meds:.acetaminophen, haloperidol lactate, hydrALAZINE, traZODone  Diet Orders (From admission, onward)    Start     Ordered   02/06/21 1004  DIET DYS 2 Room service appropriate? Yes; Fluid consistency: Thin  Diet effective now       Comments: Meds crushed via tube (family was doing this  at home) full supervision  Question Answer Comment  Room service appropriate? Yes   Fluid consistency: Thin      02/06/21 1004          DVT prophylaxis: SCDs Start: 02/05/21 [id number]     Code Status: Full Code  Family Communication: no family at bedside   Status is: Inpatient  Remains inpatient appropriate because:IV treatments appropriate due to intensity of illness or inability to take PO   Dispo: The patient is from: Home              Anticipated d/c is to: TBD              Patient currently is not medically stable to d/c.   Difficult to place patient Yes  Level of care: Telemetry Medical  Consultants:  ID Neurology   Procedures:  None   Microbiology  Staph epi positive blood cultures  Antimicrobials: vancomyin 3/4 >>    Objective: Vitals:   03/01/21 1500 03/01/21 2000 03/02/21 0006 03/02/21 0344  BP: 139/81 (!) 129/93 101/68 112/80  Pulse:  89 88 88  Resp:  '18 17 18  '$ Temp:  99.1 F (37.3 C) 98.4 F (36.9 C)   TempSrc:  Oral Oral   SpO2:  100% 94% 100%  Weight:      Height:        Intake/Output Summary (Last 24 hours) at 03/02/2021 0650 Last data filed at 03/01/2021 2246 Gross per 24 hour  Intake 357 ml  Output 1750 ml  Net -1393 ml   Filed Weights   02/25/21 0500 02/26/21 0500 02/27/21 0500  Weight: 46.8 kg 48.1 kg 50.4 kg    Examination:  Constitutional: No distress Eyes: No icterus ENMT: mmm Neck: normal, supple Respiratory: Clear bilaterally without wheezing.  Cardiovascular: Regular rate and rhythm without murmurs, no edema Abdomen: Nondistended, nontender, positive bowel sounds Musculoskeletal: no clubbing / cyanosis.  Skin: No rashes seen Neurologic: Does not follow commands, withdraws when I try to engage   Data Reviewed: I have independently reviewed following labs and imaging studies   CBC: Recent Labs  Lab 03/01/21 0225  WBC 5.4  HGB 12.2*  HCT 37.6*  MCV 81.2  PLT [id number]   Basic Metabolic Panel: Recent Labs  Lab  03/01/21 0225  NA 136  K 4.1  CL 102  CO2 27  GLUCOSE 117*  BUN 25*  CREATININE 1.33*  CALCIUM 9.4   Liver Function Tests: Recent Labs  Lab 03/01/21 0225  AST 16  ALT 14  ALKPHOS 60  BILITOT 0.6  PROT 6.6  ALBUMIN 3.1*   Coagulation Profile: No results for input(s): INR, PROTIME in the last 168 hours. HbA1C: No results for input(s): HGBA1C in the last 72 hours. CBG: Recent Labs  Lab 03/01/21 0722 03/01/21 1340 03/01/21 1551 03/01/21 2001 03/02/21 0006  GLUCAP 115* 122* 88 97 114*    Recent Results (from the past 240 hour(s))  Culture, Urine     Status: Abnormal   Collection Time: 02/27/21  7:49 AM   Specimen: Urine, Catheterized  Result Value Ref Range Status   Specimen Description URINE, CATHETERIZED  Final   Special Requests  Final    NONE Performed at [facility], [place] [address] [place], [place] [address]    Culture MULTIPLE SPECIES PRESENT, SUGGEST RECOLLECTION (A)  Final   Report Status 02/28/2021 FINAL  Final  Culture, blood (routine x 2)     Status: None (Preliminary result)   Collection Time: 02/27/21  8:50 AM   Specimen: BLOOD RIGHT HAND  Result Value Ref Range Status   Specimen Description BLOOD RIGHT HAND  Final   Special Requests   Final    BOTTLES DRAWN AEROBIC AND ANAEROBIC Blood Culture results may not be optimal due to an inadequate volume of blood received in culture bottles   Culture  Setup Time   Final    GRAM POSITIVE COCCI IN CLUSTERS IN BOTH AEROBIC AND ANAEROBIC BOTTLES Organism ID to follow CRITICAL RESULT CALLED TO, READ BACK BY AND VERIFIED WITH: PHARMD T.JACKIE AT [id number] ON 02/28/2021 BY T.SAAD Performed at [facility], [place] [address] [place], [place] [address]    Culture GRAM POSITIVE COCCI IN CLUSTERS  Final   Report Status PENDING  Incomplete  Blood Culture ID Panel (Reflexed)     Status: Abnormal   Collection Time: 02/27/21  8:50 AM  Result Value Ref Range Status   Enterococcus faecalis NOT DETECTED NOT  DETECTED Final   Enterococcus Faecium NOT DETECTED NOT DETECTED Final   Listeria monocytogenes NOT DETECTED NOT DETECTED Final   Staphylococcus species DETECTED (A) NOT DETECTED Final    Comment: CRITICAL RESULT CALLED TO, READ BACK BY AND VERIFIED WITH: PHARM D T.JACKIE AT [id number] ON 02/28/2021 BY T.SAAD.    Staphylococcus aureus (BCID) NOT DETECTED NOT DETECTED Final   Staphylococcus epidermidis DETECTED (A) NOT DETECTED Final    Comment: Methicillin (oxacillin) resistant coagulase negative staphylococcus. Possible blood culture contaminant (unless isolated from more than one blood culture draw or clinical case suggests pathogenicity). No antibiotic treatment is indicated for blood  culture contaminants. CRITICAL RESULT CALLED TO, READ BACK BY AND VERIFIED WITH: PHARM D T.JACKIE AT [id number] ON 02/28/2021 BY T.SAAD.    Staphylococcus lugdunensis NOT DETECTED NOT DETECTED Final   Streptococcus species NOT DETECTED NOT DETECTED Final   Streptococcus agalactiae NOT DETECTED NOT DETECTED Final   Streptococcus pneumoniae NOT DETECTED NOT DETECTED Final   Streptococcus pyogenes NOT DETECTED NOT DETECTED Final   A.calcoaceticus-baumannii NOT DETECTED NOT DETECTED Final   Bacteroides fragilis NOT DETECTED NOT DETECTED Final   Enterobacterales NOT DETECTED NOT DETECTED Final   Enterobacter cloacae complex NOT DETECTED NOT DETECTED Final   Escherichia coli NOT DETECTED NOT DETECTED Final   Klebsiella aerogenes NOT DETECTED NOT DETECTED Final   Klebsiella oxytoca NOT DETECTED NOT DETECTED Final   Klebsiella pneumoniae NOT DETECTED NOT DETECTED Final   Proteus species NOT DETECTED NOT DETECTED Final   Salmonella species NOT DETECTED NOT DETECTED Final   Serratia marcescens NOT DETECTED NOT DETECTED Final   Haemophilus influenzae NOT DETECTED NOT DETECTED Final   Neisseria meningitidis NOT DETECTED NOT DETECTED Final   Pseudomonas aeruginosa NOT DETECTED NOT DETECTED Final   Stenotrophomonas  maltophilia NOT DETECTED NOT DETECTED Final   Candida albicans NOT DETECTED NOT DETECTED Final   Candida auris NOT DETECTED NOT DETECTED Final   Candida glabrata NOT DETECTED NOT DETECTED Final   Candida krusei NOT DETECTED NOT DETECTED Final   Candida parapsilosis NOT DETECTED NOT DETECTED Final   Candida tropicalis NOT DETECTED NOT DETECTED Final   Cryptococcus neoformans/gattii NOT DETECTED NOT DETECTED Final   Methicillin  resistance mecA/C DETECTED (A) NOT DETECTED Final    Comment: CRITICAL RESULT CALLED TO, READ BACK BY AND VERIFIED WITH: PHARM D T.JACKIE AT [id number] ON 02/28/2021 BY T.SAAD. Performed at [facility], [place] [address], [place], [place] [address]   Culture, blood (routine x 2)     Status: Abnormal   Collection Time: 02/27/21  8:57 AM   Specimen: BLOOD RIGHT WRIST  Result Value Ref Range Status   Specimen Description BLOOD RIGHT WRIST  Final   Special Requests   Final    AEROBIC BOTTLE ONLY Blood Culture results may not be optimal due to an inadequate volume of blood received in culture bottles   Culture  Setup Time   Final    GRAM POSITIVE COCCI IN CLUSTERS AEROBIC BOTTLE ONLY CRITICAL VALUE NOTED.  VALUE IS CONSISTENT WITH PREVIOUSLY REPORTED AND CALLED VALUE.    Culture (A)  Final    STAPHYLOCOCCUS HOMINIS THE SIGNIFICANCE OF ISOLATING THIS ORGANISM FROM A SINGLE SET OF BLOOD CULTURES WHEN MULTIPLE SETS ARE DRAWN IS UNCERTAIN. PLEASE NOTIFY THE MICROBIOLOGY DEPARTMENT WITHIN ONE WEEK IF SPECIATION AND SENSITIVITIES ARE REQUIRED. Performed at [facility], [place] [address] [place], [place] [address]    Report Status 03/01/2021 FINAL  Final     Radiology Studies: No results found. Marzetta Board, MD, PhD [facility]  Between 7 am - 7 pm I am available, please contact me via Amion or Securechat  Between 7 pm - 7 am I am not available, please contact night coverage MD/APP via Amion

## 2021-03-03 LAB — GLUCOSE, CAPILLARY
Glucose-Capillary: 103 mg/dL — ABNORMAL HIGH (ref 70–99)
Glucose-Capillary: 105 mg/dL — ABNORMAL HIGH (ref 70–99)
Glucose-Capillary: 108 mg/dL — ABNORMAL HIGH (ref 70–99)
Glucose-Capillary: 108 mg/dL — ABNORMAL HIGH (ref 70–99)
Glucose-Capillary: 113 mg/dL — ABNORMAL HIGH (ref 70–99)
Glucose-Capillary: 121 mg/dL — ABNORMAL HIGH (ref 70–99)
Glucose-Capillary: 124 mg/dL — ABNORMAL HIGH (ref 70–99)

## 2021-03-03 LAB — BASIC METABOLIC PANEL
Anion gap: 10 (ref 5–15)
BUN: 26 mg/dL — ABNORMAL HIGH (ref 6–20)
CO2: 24 mmol/L (ref 22–32)
Calcium: 9.5 mg/dL (ref 8.9–10.3)
Chloride: 104 mmol/L (ref 98–111)
Creatinine, Ser: 1.4 mg/dL — ABNORMAL HIGH (ref 0.61–1.24)
GFR, Estimated: >60 mL/min (ref >60)
Glucose, Bld: 110 mg/dL — ABNORMAL HIGH (ref 70–99)
Potassium: 4.1 mmol/L (ref 3.5–5.1)
Sodium: 138 mmol/L (ref 135–145)

## 2021-03-03 LAB — VANCOMYCIN, TROUGH: Vancomycin Tr: 12 ug/mL — ABNORMAL LOW (ref 15–20)

## 2021-03-03 LAB — CULTURE, BLOOD (ROUTINE X 2)

## 2021-03-03 LAB — VANCOMYCIN, PEAK: Vancomycin Pk: 35 ug/mL (ref 30–40)

## 2021-03-03 NOTE — TOC Transition Note (Signed)
Transition of Care [facility]) - CM/SW Discharge Note   Patient Details  Name: Joshua [name] MRN: [id number] Date of Birth: Jan 21, 1981  Transition of [facility]) CM/SW Contact:  Curlene Labrum, RN Phone Number: 03/03/2021, 11:17 AM   Clinical Narrative:    Case management called and spoke with [facility] transportation this morning regarding patient's need to be transported home on Friday, 03/07/2021 by ambulance - [facility] was unable to provide transportation and payment coverage through the patient's Medicaid since the patient was outside the 75 mile transport limit.  I called and spoke with CM at [facility] and they are able to provide transportation to the home as long as the family was willing to pay the transport fee of 2686.76 in order to provide transportation for the patient to the home address in [place], [place] since the patient is more than 132 miles away from home.  I called [facility] transport and they are looking in the matter of coordinating transportation for this patient to home as planned for possibly this Friday, 03/07/2021.  CM called and spoke with Judson Roch, CM at [facility] and they asked that the patient's home health orders, facesheet, progress notes and home health orders be faxed to the agency to [phone].    I also spoke with Carolynn Sayers, CM with [facility] and referral sent to set the patient up for appropriate tube feedings and tube feeding pump as ordered.  DME orders placed to be signed by Erin Hearing, NP.  I will follow up with Tamera Punt, [facility] as necessary to set up delivery of tube feedings to the home as ordered.  CM and MSW will continue to follow the patient for home health needs for pending discharge to home on 03/07/2021 once transportation can be coordinated.   Final next level of care: [place] Barriers to Discharge: Continued Medical Work up   Patient Goals and CMS Choice Patient states their goals for  this hospitalization and ongoing recovery are:: Patient's mother agreeable to taking patient home with home health services. CMS Medicare.gov Compare Post Acute Care list provided to:: Patient Represenative (must comment) (Patient's mother - [phone]) Choice offered to / list presented to : Parent  Discharge Placement                       Discharge Plan and Services In-house Referral: Clinical Social Work Discharge Planning Services: CM Consult Post Acute Care Choice: [place]          DME Arranged: Hospital bed (Specialty wheelchair thru - [place] in [facility]) [place]:  Richardson Landry Ward, ATT for specialty wheelchair - [phone];  Safe at [facility] in [place], [place]) Date DME Agency Contacted: 02/28/21 Time DME Agency Contacted: [phone] Representative spoke with at DME Agency: Richardson Landry Ward ATT - [phone] HH Arranged: [place]: [place] Date [place]: 02/25/21   Representative spoke with at [place]: [name], [facility] at [place] in [facility] [phone]  Social Determinants of Health ([place]) Interventions     Readmission Risk Interventions Readmission Risk Prevention Plan 02/24/2021 07/04/2020  Transportation Screening Complete Complete  PCP or Specialist Appt within 5-7 Days - Complete  PCP or Specialist Appt within 3-5 Days Complete -  Home Care Screening - Complete  Medication Review (RN CM) - Complete  HRI or Home Care Consult Complete -  Social Work Consult for [place] Planning/Counseling Complete -  Palliative Care Screening Complete -  Medication Review (RN Care Manager) Complete -  Some recent data might be hidden

## 2021-03-03 NOTE — TOC Progression Note (Signed)
Transition of Care [facility]) - Progression Note    Patient Details  Name: Joshua [name] MRN: [id number] Date of Birth: Oct 09, 1981  Transition of Care [facility]) CM/SW Contact  Curlene Labrum, RN Phone Number: 03/03/2021, 2:21 PM  Clinical Narrative:    Case management spoke with the patient's sister, Joshua [name] on the phone regarding patient's transitions of care plans to home on Friday.  The patient's sister is requesting that clinicals be sent to [facility] facility in [facility] if possible as a last resort to try and place the patient in a long term care placement bed. The sister states that the family would prefer long term placement is preferable to discharging the patient home since the patient's mother needs to return to work in another 34 month.  CM spoke with Helene Kelp at [facility] in [place], [place] and clinicals were both faxed and emailed to the facility.    Joshua [name] states that she is willing to receive the patient's home for care if the [facility] facility is unwilling to accept the patient for admission.  I explained to the sister that the cost of transport to [place], [place] would cost around 2700.00 dollars out of pocket to home in [place], [place] or to the facility in [place], [place].  The sister states that the patient's family is willing to pick him up by wheelchair and transport him either way instead of ambulance transport since it is so costly and they would like him closer to home.  CM will follow up with the family tomorrow after speaking with Helene Kelp, CM at [facility] - in regards to transitions of care plans for discharge.   Expected Discharge Plan: [place] Barriers to Discharge: Continued Medical Work up  Expected Discharge Plan and Services Expected Discharge Plan: [place] In-house Referral: Clinical Social Work Discharge Planning Services: CM Consult Post Acute Care Choice: Durable Medical  [place] arrangements for the past 2 months: [place] (Discharging home to [place], [place])                 DME Arranged: Hospital bed (Specialty wheelchair thru - [place] in [facility]) [place]:  Richardson Landry Ward, ATT for specialty wheelchair - [phone];  Safe at [place] in [place], [place]) Date DME Agency Contacted: 02/28/21 Time DME Agency Contacted: [phone] Representative spoke with at DME Agency: Richardson Landry Ward ATT - [phone] HH Arranged: RN,PT,OT,IV Antibiotics,Nurse's Aide [place] Agency: [place] Date [place]: 02/25/21   Representative spoke with at [place]: [name], [facility] at [place] in [facility] [phone]   Social Determinants of Health ([place]) Interventions    Readmission Risk Interventions Readmission Risk Prevention Plan 02/24/2021 07/04/2020  Transportation Screening Complete Complete  PCP or Specialist Appt within 5-7 Days - Complete  PCP or Specialist Appt within 3-5 Days Complete -  Home Care Screening - Complete  Medication Review (RN CM) - Complete  HRI or Home Care Consult Complete -  Social Work Consult for [place] Planning/Counseling Complete -  Palliative Care Screening Complete -  Medication Review Press photographer) Complete -  Some recent data might be hidden

## 2021-03-03 NOTE — Plan of Care (Signed)
  Problem: Education: Goal: Knowledge of General Education information will improve Description: Including pain rating scale, medication(s)/side effects and non-pharmacologic comfort measures Outcome: Not Progressing   Problem: Health Behavior/Discharge Planning: Goal: Ability to manage health-related needs will improve Outcome: Not Progressing   Problem: Clinical Measurements: Goal: Ability to maintain clinical measurements within normal limits will improve Outcome: Not Progressing Goal: Will remain free from infection Outcome: Not Progressing Goal: Diagnostic test results will improve Outcome: Not Progressing Goal: Respiratory complications will improve Outcome: Not Progressing Goal: Cardiovascular complication will be avoided Outcome: Not Progressing   Problem: Activity: Goal: Risk for activity intolerance will decrease Outcome: Not Progressing   Problem: Nutrition: Goal: Adequate nutrition will be maintained Outcome: Not Progressing   Problem: Coping: Goal: Level of anxiety will decrease Outcome: Not Progressing   Problem: Elimination: Goal: Will not experience complications related to bowel motility Outcome: Not Progressing Goal: Will not experience complications related to urinary retention Outcome: Not Progressing   Problem: Pain Managment: Goal: General experience of comfort will improve Outcome: Not Progressing   Problem: Skin Integrity: Goal: Risk for impaired skin integrity will decrease Outcome: Not Progressing   Problem: Education: Goal: Expressions of having a comfortable level of knowledge regarding the disease process will increase Outcome: Not Progressing   Problem: Coping: Goal: Ability to adjust to condition or change in health will improve Outcome: Not Progressing Goal: Ability to identify appropriate support needs will improve Outcome: Not Progressing

## 2021-03-03 NOTE — Progress Notes (Signed)
Pharmacy Antibiotic Note  Joshua [name] is a 40 y.o. male admitted on 02/04/2021 with bacteremia.  Pharmacy has been consulted for vancomycin dosing.  Patient with continued AMS and nonverbal 2/2 multiple seizures. Currently afebrile; fever earlier this week but also received COVID vaccine on 2/28. CRP on 3/3 elevated at 1.5. Bcx showing Staph epi in 3/4 bottles, methicillin resistance detected. Source of infection unclear at this time  Vancomycin levels drawn today. Peak 35 (true peak 41) and trough 12 mcg/ml. AUC calculated at 567 (goal 400-600).   Plan: Continue Vancomycin '1000mg'$  IV q24h for remainder of 7 day course Monitor clinical course  Height: 6' (182.9 cm) Weight: 50.4 kg (111 lb 1.8 oz) IBW/kg (Calculated) : 77.6  Temp (24hrs), Avg:98.1 F (36.7 C), Min:97.7 F (36.5 C), Max:98.4 F (36.9 C)  Recent Labs  Lab 03/01/21 0225 03/03/21 0945 03/03/21 1408  WBC 5.4  --   --   CREATININE 1.33* 1.40*  --   VANCOTROUGH  --  12*  --   VANCOPEAK  --   --  35    Estimated Creatinine Clearance: 49.5 mL/min (A) (by C-G formula based on SCr of 1.4 mg/dL (H)).    No Known Allergies  Antimicrobials this admission: 3/4 vancomycin >>    Dose adjustments this admission: None  Microbiology results: 3/4  BCx: CNS 3/4 bottles   Joshua [name] A. Levada Dy, PharmD, BCPS, FNKF Clinical Pharmacist [place] Please utilize Amion for appropriate phone number to reach the unit pharmacist ([place])   03/03/2021 3:31 PM

## 2021-03-03 NOTE — Progress Notes (Signed)
PROGRESS NOTE  Joshua [name] [id number] DOB: 1981/01/27 DOA: 02/04/2021 PCP: Patient, [name]   LOS: 26 days   Brief Narrative / Interim history: 40 year old male with a history of intracranial hemorrhage in 08/2019 requiring a hemicraniectomy and evacuation was discharged home in May 2021. The patient has subsequently had multiple seizures with multiple ER visits for them. He presented on 02/04/2021 due to family's concern that he was having another seizure. He had a leftward gaze which was noted in the ED as well. Despite receiving Ativan, it did not improve and therefore he was given an IV Keppra load  Difficult to place patient  Subjective / 24h Interval events: Awake, alert, non verbal.  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  Will increase nocturnal dose to 50 mg. 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  ID consulted.  Underwent a 2D echo which was initially read as having vegetations however upon discussion with cardiology by ID there was actually no evidence for vegetations on TEE,  recommending only 7 days of IV vancomycin at this point.  Vancomycin was started on 3/4, today is day 4/7  HTN with H/o PRES -Blood pressure normalized, intermittently soft but asymptomatic - continue Norvasc decreased to 5 mg daily, continue hydralazine and Metoprolol for now and can decrease dosages if blood pressure remains suboptimal  Severe protein-calorie malnutrition, underweight Dysphagia Body mass index is 13.1 kg/m. - He has PEG tube and is tolerating tube feeds - also on D 2 diet with thin liquids but is not eating or drinking (declines to) - continue Prostat supplement  - I have been speaking with nursing about making sure the abdominal binder is covering his PEG to protect it. Nutrition Problem: Inadequate oral intake Etiology: dysphagia Signs/Symptoms: meal completion < 25% Interventions: Prostat,Tube feeding  -SLP reevaluated on 3/2.  Continue D2 diet with thin liquids but patient will also need soft hold finger foods that he can self-feed.  We will also need to have staff at bedside to feed patient otherwise until oral intake improves.  Other problems: CKD 3 - Creatinine stable  Scheduled Meds: . amLODipine  5 mg Per Tube q AM  . feeding supplement (PROSource TF)  45 mL Per Tube BID  . free water  200 mL Per Tube Q4H  . hydrALAZINE  25 mg Per Tube Q8H  . levETIRAcetam  1,000 mg Per Tube Q12H  . metoprolol tartrate  50 mg Per Tube BID  . naltrexone  25 mg Per Tube Daily  . QUEtiapine  25 mg Per Tube Daily  . QUEtiapine  50 mg Per Tube QHS   Continuous Infusions: . feeding supplement (OSMOLITE 1.5 CAL) 60 mL/hr at 03/02/21 2129  . vancomycin 1,000 mg (03/02/21 1010)  PRN Meds:.acetaminophen, haloperidol lactate, hydrALAZINE, traZODone  Diet Orders (From admission, onward)    Start     Ordered   02/06/21 1004  DIET DYS 2 Room service appropriate? Yes; Fluid consistency: Thin  Diet effective now       Comments: Meds crushed via tube (family was doing this  at home) full supervision  Question Answer Comment  Room service appropriate? Yes   Fluid consistency: Thin      02/06/21 1004          DVT prophylaxis: SCDs Start: 02/05/21 [id number]     Code Status: Full Code  Family Communication: no family at bedside   Status is: Inpatient  Remains inpatient appropriate because:IV treatments appropriate due to intensity of illness or inability to take PO   Dispo: The patient is from: Home              Anticipated d/c is to: TBD              Patient currently is not medically stable to d/c.   Difficult to place patient Yes  Level of care: Telemetry Medical  Consultants:  ID Neurology   Procedures:  None   Microbiology  Staph epi positive blood cultures  Antimicrobials: vancomyin 3/4 >>    Objective: Vitals:   03/02/21 2015 03/03/21 0000 03/03/21 0324 03/03/21 0817  BP: (!) 149/85  109/70 124/78  Pulse: 81  84 88  Resp: '18  17 20  '$ Temp: 98 F (36.7 C) 97.9 F (36.6 C) 97.7 F (36.5 C) 98.4 F (36.9 C)  TempSrc: Axillary Oral Oral Axillary  SpO2: 98%  100% 100%  Weight:      Height:        Intake/Output Summary (Last 24 hours) at 03/03/2021 0921 Last data filed at 03/03/2021 [id number] Gross per 24 hour  Intake 1960 ml  Output -  Net 1960 ml   Filed Weights   02/25/21 0500 02/26/21 0500 02/27/21 0500  Weight: 46.8 kg 48.1 kg 50.4 kg    Examination:  Constitutional: NAD Respiratory: CTA biL, no wheezing  Cardiovascular: rrr, no mrg, no edema   Data Reviewed: I have independently reviewed following labs and imaging studies   CBC: Recent Labs  Lab 03/01/21 0225  WBC 5.4  HGB 12.2*  HCT 37.6*  MCV 81.2  PLT [id number]   Basic Metabolic Panel: Recent Labs  Lab 03/01/21 0225  NA 136  K 4.1  CL 102  CO2 27  GLUCOSE 117*  BUN 25*  CREATININE 1.33*  CALCIUM 9.4   Liver Function Tests: Recent Labs  Lab 03/01/21 0225  AST 16  ALT 14  ALKPHOS 60  BILITOT 0.6  PROT 6.6  ALBUMIN 3.1*   Coagulation  Profile: No results for input(s): INR, PROTIME in the last 168 hours. HbA1C: No results for input(s): HGBA1C in the last 72 hours. CBG: Recent Labs  Lab 03/02/21 1848 03/02/21 2024 03/02/21 2359 03/03/21 0323 03/03/21 0815  GLUCAP 102* 112* 108* 124* 113*    Recent Results (from the past 240 hour(s))  Culture, Urine     Status: Abnormal   Collection Time: 02/27/21  7:49 AM   Specimen: Urine, Catheterized  Result Value Ref Range Status   Specimen Description URINE, CATHETERIZED  Final   Special Requests   Final    NONE Performed at [facility], [place] [address] [place],  [address]    Culture MULTIPLE SPECIES PRESENT, SUGGEST RECOLLECTION (A)  Final   Report Status  02/28/2021 FINAL  Final  Culture, blood (routine x 2)     Status: Abnormal   Collection Time: 02/27/21  8:50 AM   Specimen: BLOOD RIGHT HAND  Result Value Ref Range Status   Specimen Description BLOOD RIGHT HAND  Final   Special Requests   Final    BOTTLES DRAWN AEROBIC AND ANAEROBIC Blood Culture results may not be optimal due to an inadequate volume of blood received in culture bottles   Culture  Setup Time   Final    GRAM POSITIVE COCCI IN CLUSTERS IN BOTH AEROBIC AND ANAEROBIC BOTTLES Organism ID to follow CRITICAL RESULT CALLED TO, READ BACK BY AND VERIFIED WITH: PHARMD T.JACKIE AT [id number] ON 02/28/2021 BY T.SAAD    Culture (A)  Final    STAPHYLOCOCCUS EPIDERMIDIS THE SIGNIFICANCE OF ISOLATING THIS ORGANISM FROM A SINGLE SET OF BLOOD CULTURES WHEN MULTIPLE SETS ARE DRAWN IS UNCERTAIN. PLEASE NOTIFY THE MICROBIOLOGY DEPARTMENT WITHIN ONE WEEK IF SPECIATION AND SENSITIVITIES ARE REQUIRED. Performed at [facility], [place] [address], [place], [place] [address]    Report Status 03/03/2021 FINAL  Final  Blood Culture ID Panel (Reflexed)     Status: Abnormal   Collection Time: 02/27/21  8:50 AM  Result Value Ref Range Status   Enterococcus faecalis NOT DETECTED NOT DETECTED Final   Enterococcus  Faecium NOT DETECTED NOT DETECTED Final   Listeria monocytogenes NOT DETECTED NOT DETECTED Final   Staphylococcus species DETECTED (A) NOT DETECTED Final    Comment: CRITICAL RESULT CALLED TO, READ BACK BY AND VERIFIED WITH: PHARM D T.JACKIE AT [id number] ON 02/28/2021 BY T.SAAD.    Staphylococcus aureus (BCID) NOT DETECTED NOT DETECTED Final   Staphylococcus epidermidis DETECTED (A) NOT DETECTED Final    Comment: Methicillin (oxacillin) resistant coagulase negative staphylococcus. Possible blood culture contaminant (unless isolated from more than one blood culture draw or clinical case suggests pathogenicity). No antibiotic treatment is indicated for blood  culture contaminants. CRITICAL RESULT CALLED TO, READ BACK BY AND VERIFIED WITH: PHARM D T.JACKIE AT [id number] ON 02/28/2021 BY T.SAAD.    Staphylococcus lugdunensis NOT DETECTED NOT DETECTED Final   Streptococcus species NOT DETECTED NOT DETECTED Final   Streptococcus agalactiae NOT DETECTED NOT DETECTED Final   Streptococcus pneumoniae NOT DETECTED NOT DETECTED Final   Streptococcus pyogenes NOT DETECTED NOT DETECTED Final   A.calcoaceticus-baumannii NOT DETECTED NOT DETECTED Final   Bacteroides fragilis NOT DETECTED NOT DETECTED Final   Enterobacterales NOT DETECTED NOT DETECTED Final   Enterobacter cloacae complex NOT DETECTED NOT DETECTED Final   Escherichia coli NOT DETECTED NOT DETECTED Final   Klebsiella aerogenes NOT DETECTED NOT DETECTED Final   Klebsiella oxytoca NOT DETECTED NOT DETECTED Final   Klebsiella pneumoniae NOT DETECTED NOT DETECTED Final   Proteus species NOT DETECTED NOT DETECTED Final   Salmonella species NOT DETECTED NOT DETECTED Final   Serratia marcescens NOT DETECTED NOT DETECTED Final   Haemophilus influenzae NOT DETECTED NOT DETECTED Final   Neisseria meningitidis NOT DETECTED NOT DETECTED Final   Pseudomonas aeruginosa NOT DETECTED NOT DETECTED Final   Stenotrophomonas maltophilia NOT DETECTED NOT DETECTED  Final   Candida albicans NOT DETECTED NOT DETECTED Final   Candida auris NOT DETECTED NOT DETECTED Final   Candida glabrata NOT DETECTED NOT DETECTED Final   Candida krusei NOT DETECTED NOT DETECTED Final   Candida parapsilosis NOT DETECTED NOT DETECTED Final   Candida tropicalis NOT DETECTED NOT DETECTED Final   Cryptococcus neoformans/gattii NOT DETECTED NOT DETECTED Final  Methicillin resistance mecA/C DETECTED (A) NOT DETECTED Final    Comment: CRITICAL RESULT CALLED TO, READ BACK BY AND VERIFIED WITH: PHARM D T.JACKIE AT [id number] ON 02/28/2021 BY T.SAAD. Performed at [facility], [place] [address], [place], [place] [address]   Culture, blood (routine x 2)     Status: Abnormal   Collection Time: 02/27/21  8:57 AM   Specimen: BLOOD RIGHT WRIST  Result Value Ref Range Status   Specimen Description BLOOD RIGHT WRIST  Final   Special Requests   Final    AEROBIC BOTTLE ONLY Blood Culture results may not be optimal due to an inadequate volume of blood received in culture bottles   Culture  Setup Time   Final    GRAM POSITIVE COCCI IN CLUSTERS AEROBIC BOTTLE ONLY CRITICAL VALUE NOTED.  VALUE IS CONSISTENT WITH PREVIOUSLY REPORTED AND CALLED VALUE.    Culture (A)  Final    STAPHYLOCOCCUS HOMINIS THE SIGNIFICANCE OF ISOLATING THIS ORGANISM FROM A SINGLE SET OF BLOOD CULTURES WHEN MULTIPLE SETS ARE DRAWN IS UNCERTAIN. PLEASE NOTIFY THE MICROBIOLOGY DEPARTMENT WITHIN ONE WEEK IF SPECIATION AND SENSITIVITIES ARE REQUIRED. Performed at [facility], [place] [address], [place], [place] [address]    Report Status 03/01/2021 FINAL  Final     Radiology Studies: No results found. Marzetta Board, MD, PhD [facility]  Between 7 am - 7 pm I am available, please contact me via Amion or Securechat  Between 7 pm - 7 am I am not available, please contact night coverage MD/APP via Amion

## 2021-03-04 LAB — COMPREHENSIVE METABOLIC PANEL
ALT: 13 U/L (ref 0–44)
AST: 12 U/L — ABNORMAL LOW (ref 15–41)
Albumin: 3.2 g/dL — ABNORMAL LOW (ref 3.5–5.0)
Alkaline Phosphatase: 61 U/L (ref 38–126)
Anion gap: 8 (ref 5–15)
BUN: 25 mg/dL — ABNORMAL HIGH (ref 6–20)
CO2: 27 mmol/L (ref 22–32)
Calcium: 9.6 mg/dL (ref 8.9–10.3)
Chloride: 102 mmol/L (ref 98–111)
Creatinine, Ser: 1.33 mg/dL — ABNORMAL HIGH (ref 0.61–1.24)
GFR, Estimated: >60 mL/min (ref >60)
Glucose, Bld: 96 mg/dL (ref 70–99)
Potassium: 4.1 mmol/L (ref 3.5–5.1)
Sodium: 137 mmol/L (ref 135–145)
Total Bilirubin: 0.6 mg/dL (ref 0.3–1.2)
Total Protein: 6.6 g/dL (ref 6.5–8.1)

## 2021-03-04 LAB — GLUCOSE, CAPILLARY
Glucose-Capillary: 112 mg/dL — ABNORMAL HIGH (ref 70–99)
Glucose-Capillary: 93 mg/dL (ref 70–99)
Glucose-Capillary: 129 mg/dL — ABNORMAL HIGH (ref 70–99)
Glucose-Capillary: 102 mg/dL — ABNORMAL HIGH (ref 70–99)

## 2021-03-04 LAB — CBC
HCT: 38 % — ABNORMAL LOW (ref 39.0–52.0)
Hemoglobin: 12.2 g/dL — ABNORMAL LOW (ref 13.0–17.0)
MCH: 26.1 pg (ref 26.0–34.0)
MCHC: 32.1 g/dL (ref 30.0–36.0)
MCV: 81.4 fL (ref 80.0–100.0)
Platelets: 243 10*3/uL (ref 150–400)
RBC: 4.67 MIL/uL (ref 4.22–5.81)
RDW: 15.2 % (ref 11.5–15.5)
WBC: 5.9 10*3/uL (ref 4.0–10.5)
nRBC: 0 % (ref 0.0–0.2)

## 2021-03-04 NOTE — Plan of Care (Signed)
Not progressing at this time due to ongoing confusion and altered mental status.   Problem: Education: Goal: Knowledge of General Education information will improve Description: Including pain rating scale, medication(s)/side effects and non-pharmacologic comfort measures Outcome: Not Progressing   Problem: Health Behavior/Discharge Planning: Goal: Ability to manage health-related needs will improve Outcome: Not Progressing   Problem: Clinical Measurements: Goal: Ability to maintain clinical measurements within normal limits will improve Outcome: Not Progressing Goal: Will remain free from infection Outcome: Not Progressing Goal: Diagnostic test results will improve Outcome: Not Progressing Goal: Respiratory complications will improve Outcome: Not Progressing Goal: Cardiovascular complication will be avoided Outcome: Not Progressing   Problem: Activity: Goal: Risk for activity intolerance will decrease Outcome: Not Progressing   Problem: Nutrition: Goal: Adequate nutrition will be maintained Outcome: Not Progressing   Problem: Coping: Goal: Level of anxiety will decrease Outcome: Not Progressing   Problem: Elimination: Goal: Will not experience complications related to bowel motility Outcome: Not Progressing Goal: Will not experience complications related to urinary retention Outcome: Not Progressing   Problem: Pain Managment: Goal: General experience of comfort will improve Outcome: Not Progressing

## 2021-03-04 NOTE — Progress Notes (Addendum)
[facility] PROGRESS NOTE  Joshua [name] [id number] DOB: 07-17-1981 DOA: 02/04/2021 PCP: Patient, [name]  Status: Remains inpatient appropriate because:Altered mental status, Unsafe d/c plan and Inpatient level of care appropriate due to severity of illness   Dispo: The patient is from: Home              Anticipated d/c is to: SNF              Patient currently is not medically stable to d/c.   Difficult to place patient Yes-needs LT SNF-has Medicaid appears to be active in [place] but and active in [place]/family would like to place him in [place] if possible; Pharmacotherapy has improved patient's behavioral response in context of acute anoxic brain injury.  As of 3/1 mittens are off.  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  3/4 [place] in [facility] have declined admission  Patient will complete IV vancomycin on Thursday 3/10 and we anticipate patient will discharge home with family on 3/11   Level of care: Telemetry Medical  Code Status: Full Family Communication: As of 3/8 both myself and [facility] Case manager have spoken with the patient's mother regarding update on clinical status and plans for discharge on 3/11 DVT prophylaxis: SCDs Vaccination status: 2/28 Spoke with his mother who agreed to Covid vaccine-order placed for Janssen vaccine that was given 2/28  Foley catheter: No  HPI: 40 year old male with a history of intracranial hemorrhage in 08/2019 requiring a hemicraniectomy and evacuation was discharged home in May 2021.  The patient has subsequently had multiple seizures with multiple ER visits for them. He presented on 02/04/2021 due to family's concern that he was having another seizure.  He had a leftward gaze which was noted in the ED as well.  Despite receiving Ativan, it did not improve and therefore he was given an IV Keppra load.   Subjective: Awake.  Remains  nonverbal.  Does attempt to use left hand to push examiner away in regards to placement of stethoscope but able to distract by holding hand and otherwise allows exam.  Objective: Vitals:   03/03/21 2021 03/04/21 0413  BP: (!) 138/95 98/82  Pulse: 89 72  Resp: 18 17  Temp: 97.6 F (36.4 C) 98.9 F (37.2 C)  SpO2: 100% 99%   No intake or output data in the 24 hours ending 03/04/21 0751 Filed Weights   02/26/21 0500 02/27/21 0500 03/04/21 0500  Weight: 48.1 kg 50.4 kg 48.5 kg    Exam:  Constitutional: Awake and alert, calm Respiratory: Anterior lung sounds are clear, stable on room air Cardiovascular: Normal heart sounds, pulses regular, no peripheral edema Abdomen: Abdomen soft and nontender.  Bowel sounds present.  Variable oral intake and over the past week has averaged between 15% and 80% of meals.  Patient must be fed since he is unable to feed himself.  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 Appears to have normal tone of left upper extremity which he uses purposefully Neurologic: Cranial nerves II through XII appear to be grossly intact -moves left upper extremity with purpose dense hemiplegia right side Psychiatric: Awake and alert, nonverbal therefore unable to assess orientation.  Asked patient his name and unable to state this as well and this is likely related to underlying receptive and expressive aphasia from previous stroke.  Assessment/Plan: Acute problems: Seizure with new chronic encephalopathy/suspected anoxic  brain injury -Likely secondary to prior brain injury related to intracranial hemorrhage -Prior to recent seizures and resultant brain injury he had been able to eat orally -EEG ordered but then canceled as patient was noncooperative -Continue Keppra 1 g twice daily- Neurology has signed off  -Added Seroquel 25 mg per tube twice daily on 2/25 with improvement in behaviors  although nursing staff still says he becomes combative at times.  Will increase nocturnal dose to 50 mg. 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  Cardiology consulted for TEE but after consulting cardiologist reviewed echo did not feel that there were actual vegetations.  ID recommended a total of 7 days of antibiotics.  HTN with  H/o PRES - BP stable during the day but overnight SBP's over the past several nights have been low in the 80s to 90s.  Suspect improved blood pressure readings as agitation improved with the initiation of Seroquel - continue Norvasc decreased to 5 mg daily, continue hydralazine and Metoprolol for now and can decrease dosages if blood pressure remains suboptimal  Severe protein-calorie malnutrition, underweight Dysphagia Body mass index is 13.1 kg/m. -  He has PEG tube and is tolerating tube feeds - also on D 2 diet with thin liquids but is not eating or drinking (declines to) - continue Prostat supplement  - I have been speaking with nursing about making sure the abdominal binder is covering his PEG to protect it. Nutrition Problem: Inadequate oral intake Etiology: dysphagia Signs/Symptoms: meal completion < 25% Interventions: Prostat,Tube feeding   -SLP reevaluated on 3/2.  Continue D2 diet with thin liquids but patient will also need soft hold finger foods that he can self-feed.  We will also need to have staff at bedside to feed patient otherwise until oral intake improves.  Other problems: CKD 3 - Creatinine stable   Data Reviewed: Basic Metabolic Panel: Recent Labs  Lab 03/01/21 0225 03/03/21 0945 03/04/21 0356  NA 136 138 137  K 4.1 4.1 4.1  CL 102 104 102  CO2 '27 24 27  '$ GLUCOSE 117* 110* 96  BUN 25* 26* 25*  CREATININE 1.33* 1.40* 1.33*  CALCIUM 9.4 9.5 9.6   Liver Function Tests: Recent Labs  Lab 03/01/21 0225 03/04/21 0356  AST 16 12*  ALT 14 13  ALKPHOS 60 61  BILITOT 0.6 0.6  PROT 6.6 6.6  ALBUMIN 3.1* 3.2*   No results for input(s): LIPASE, AMYLASE in the last 168 hours. No results for input(s): AMMONIA in the last 168 hours. CBC: Recent Labs  Lab 03/01/21 0225 03/04/21 0356  WBC 5.4 5.9  HGB 12.2* 12.2*  HCT 37.6* 38.0*  MCV 81.2 81.4  PLT 235 243   Cardiac Enzymes: No results for input(s): CKTOTAL, CKMB, CKMBINDEX, TROPONINI in the last 168 hours. BNP (last 3 results) No results for input(s): BNP in the last 8760 hours.  ProBNP (last 3 results)  No results for input(s): PROBNP in the last 8760 hours.  CBG: Recent Labs  Lab 03/03/21 1139 03/03/21 1538 03/03/21 2020 03/03/21 2341 03/04/21 0412  GLUCAP 103* 108* 121* 105* 112*    Recent Results (from the past 240 hour(s))  Culture, Urine     Status: Abnormal   Collection Time: 02/27/21  7:49 AM   Specimen: Urine, Catheterized  Result Value Ref Range Status   Specimen Description URINE, CATHETERIZED  Final   Special Requests   Final    NONE Performed at [facility], [place] [address] [place], [place] [address]    Culture MULTIPLE SPECIES PRESENT, SUGGEST RECOLLECTION (A)  Final   Report Status 02/28/2021 FINAL  Final  Culture, blood (routine x 2)     Status: Abnormal   Collection Time: 02/27/21  8:50 AM   Specimen:  BLOOD RIGHT HAND  Result Value Ref Range Status   Specimen Description BLOOD RIGHT HAND  Final   Special Requests   Final    BOTTLES DRAWN AEROBIC AND ANAEROBIC Blood Culture results may not be optimal due to an inadequate volume of blood received in culture bottles   Culture  Setup Time   Final    GRAM POSITIVE COCCI IN CLUSTERS IN BOTH AEROBIC AND ANAEROBIC BOTTLES Organism ID to follow CRITICAL RESULT CALLED TO, READ BACK BY AND VERIFIED WITH: PHARMD T.JACKIE AT [id number] ON 02/28/2021 BY T.SAAD    Culture (A)  Final    STAPHYLOCOCCUS EPIDERMIDIS THE SIGNIFICANCE OF ISOLATING THIS ORGANISM FROM A SINGLE SET OF BLOOD CULTURES WHEN MULTIPLE SETS ARE DRAWN IS UNCERTAIN. PLEASE NOTIFY THE MICROBIOLOGY DEPARTMENT WITHIN ONE WEEK IF SPECIATION AND SENSITIVITIES ARE REQUIRED. Performed at [facility], [place] [address], [place], [place] [address]    Report Status 03/03/2021 FINAL  Final  Blood Culture ID Panel (Reflexed)     Status: Abnormal   Collection Time: 02/27/21  8:50 AM  Result Value Ref Range Status   Enterococcus faecalis NOT DETECTED NOT DETECTED Final   Enterococcus Faecium NOT DETECTED NOT DETECTED Final   Listeria monocytogenes NOT DETECTED NOT DETECTED Final   Staphylococcus species DETECTED (A) NOT DETECTED Final    Comment: CRITICAL RESULT CALLED TO, READ BACK BY AND VERIFIED WITH: PHARM D T.JACKIE AT [id number] ON 02/28/2021 BY T.SAAD.    Staphylococcus aureus (BCID) NOT DETECTED NOT DETECTED Final   Staphylococcus epidermidis DETECTED (A) NOT DETECTED Final    Comment: Methicillin (oxacillin) resistant coagulase negative staphylococcus. Possible blood culture contaminant (unless isolated from more than one blood culture draw or clinical case suggests pathogenicity). No antibiotic treatment is indicated for blood  culture contaminants. CRITICAL RESULT CALLED TO, READ BACK BY AND VERIFIED WITH: PHARM D T.JACKIE AT [id number] ON 02/28/2021 BY T.SAAD.    Staphylococcus lugdunensis NOT  DETECTED NOT DETECTED Final   Streptococcus species NOT DETECTED NOT DETECTED Final   Streptococcus agalactiae NOT DETECTED NOT DETECTED Final   Streptococcus pneumoniae NOT DETECTED NOT DETECTED Final   Streptococcus pyogenes NOT DETECTED NOT DETECTED Final   A.calcoaceticus-baumannii NOT DETECTED NOT DETECTED Final   Bacteroides fragilis NOT DETECTED NOT DETECTED Final   Enterobacterales NOT DETECTED NOT DETECTED Final   Enterobacter cloacae complex NOT DETECTED NOT DETECTED Final   Escherichia coli NOT DETECTED NOT DETECTED Final   Klebsiella aerogenes NOT DETECTED NOT DETECTED Final   Klebsiella oxytoca NOT DETECTED NOT DETECTED Final   Klebsiella pneumoniae NOT DETECTED NOT DETECTED Final   Proteus species NOT DETECTED NOT DETECTED Final  Salmonella species NOT DETECTED NOT DETECTED Final   Serratia marcescens NOT DETECTED NOT DETECTED Final   Haemophilus influenzae NOT DETECTED NOT DETECTED Final   Neisseria meningitidis NOT DETECTED NOT DETECTED Final   Pseudomonas aeruginosa NOT DETECTED NOT DETECTED Final   Stenotrophomonas maltophilia NOT DETECTED NOT DETECTED Final   Candida albicans NOT DETECTED NOT DETECTED Final   Candida auris NOT DETECTED NOT DETECTED Final   Candida glabrata NOT DETECTED NOT DETECTED Final   Candida krusei NOT DETECTED NOT DETECTED Final   Candida parapsilosis NOT DETECTED NOT DETECTED Final   Candida tropicalis NOT DETECTED NOT DETECTED Final   Cryptococcus neoformans/gattii NOT DETECTED NOT DETECTED Final   Methicillin resistance mecA/C DETECTED (A) NOT DETECTED Final    Comment: CRITICAL RESULT CALLED TO, READ BACK BY AND VERIFIED WITH: PHARM D T.JACKIE AT [id number] ON 02/28/2021 BY T.SAAD. Performed at [facility], [place] [address], [place], [place] [address]   Culture, blood (routine x 2)     Status: Abnormal   Collection Time: 02/27/21  8:57 AM   Specimen: BLOOD RIGHT WRIST  Result Value Ref Range Status   Specimen Description BLOOD RIGHT  WRIST  Final   Special Requests   Final    AEROBIC BOTTLE ONLY Blood Culture results may not be optimal due to an inadequate volume of blood received in culture bottles   Culture  Setup Time   Final    GRAM POSITIVE COCCI IN CLUSTERS AEROBIC BOTTLE ONLY CRITICAL VALUE NOTED.  VALUE IS CONSISTENT WITH PREVIOUSLY REPORTED AND CALLED VALUE.    Culture (A)  Final    STAPHYLOCOCCUS HOMINIS THE SIGNIFICANCE OF ISOLATING THIS ORGANISM FROM A SINGLE SET OF BLOOD CULTURES WHEN MULTIPLE SETS ARE DRAWN IS UNCERTAIN. PLEASE NOTIFY THE MICROBIOLOGY DEPARTMENT WITHIN ONE WEEK IF SPECIATION AND SENSITIVITIES ARE REQUIRED. Performed at [facility], [place] [address], [place],  [address]    Report Status 03/01/2021 FINAL  Final     Studies: No results found.  Scheduled Meds: . amLODipine  5 mg Per Tube q AM  . feeding supplement (PROSource TF)  45 mL Per Tube BID  . free water  200 mL Per Tube Q4H  . hydrALAZINE  25 mg Per Tube Q8H  . levETIRAcetam  1,000 mg Per Tube Q12H  . metoprolol tartrate  50 mg Per Tube BID  . naltrexone  25 mg Per Tube Daily  . QUEtiapine  25 mg Per Tube Daily  . QUEtiapine  50 mg Per Tube QHS   Continuous Infusions: . feeding supplement (OSMOLITE 1.5 CAL) 1,000 mL (03/03/21 1108)  . 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   Essential hypertension   Encephalopathy chronic   Staphylococcus epidermidis bacteremia   Bacteremia due to methicillin resistant Staphylococcus epidermidis   Aortic valve endocarditis   Vegetative endocarditis of mitral valve   Septic phlebitis   Consultants:  Neurology  Procedures:  None  Antibiotics: Anti-infectives (From admission, onward)   Start     Dose/Rate Route Frequency Ordered Stop   03/01/21 1000  vancomycin (VANCOREADY) IVPB 1000 mg/200 mL        1,000 mg 200 mL/hr over 60  Minutes Intravenous Every 24 hours 02/28/21 0907     02/28/21 1000  vancomycin (VANCOREADY) IVPB 1250 mg/250 mL        1,250 mg 166.7 mL/hr over  90 Minutes Intravenous  Once 02/28/21 [id number] 02/28/21 1214       Time spent: 20 minutes    Erin Hearing ANP  [facility] 7 am - 330 pm/M-F for direct patient care and secure chat Please refer to Amion for contact info 27  days

## 2021-03-04 NOTE — Progress Notes (Signed)
  Speech Language Pathology Treatment: Dysphagia  Patient Details Name: Joshua [name] MRN: [id number] DOB: Feb 27, 1981 Today's Date: 03/04/2021 Time: [id number] SLP Time Calculation (min) (ACUTE ONLY): 21 min  Assessment / Plan / Recommendation Clinical Impression  SLP followed up for dysphagia intervention. Pt initially resistive with POs, however with additional attempts at offering PO, accepted thins via straw, upgraded regular snack, and puree consistencies. Note some baseline upper respiratory rattling (appeared to clear with pts reflexive cough). Of note recent chest x ray was unremarkable. Pt exhibited some prolonged mastication of solids with minimal right sided labial residuals without awareness. Pt agreeable for thin liquid alternation. Pt with improved PO acceptance with self feeding and finger foods. Advanced to dysphagia 3 (mechanical soft) and thin liquids with meds via PEG (as baseline).    HPI HPI: HPI: Joshua [name] is a 40 y.o. 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 Presents today by EMS for concern for seizures. Patient at baseline is nonverbal with right hemiplegia and requires full assistance with ADLs, and is being cared for at home by family. CT of head without acute intracranial abnormality. 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 Chest x ray with mild hazy airspace opacity within the right mid and lower lung which could be due to atelectasis and/or infectious etiology.  Pt has been in hospital for 21 days - pt now has a PEG.      SLP Plan  Continue with current plan of care       Recommendations  Diet recommendations: Dysphagia 3  (mechanical soft);Thin liquid Liquids provided via: Straw Medication Administration: Via alternative means Supervision: Intermittent supervision to cue for compensatory strategies;Staff to assist with self feeding Compensations: Small sips/bites;Slow rate;Minimize environmental distractions;Follow solids with liquid Postural Changes and/or Swallow Maneuvers: Seated upright 90 degrees;Upright 30-60 min after meal                Oral Care Recommendations: Oral care BID Follow up Recommendations: 24 hour supervision/assistance SLP Visit Diagnosis: Dysphagia, oropharyngeal phase (R13.12) Plan: Continue with current plan of care       GO                Hayden Rasmussen MA, CCC-SLP Acute Rehabilitation Services   03/04/2021, 11:13 AM

## 2021-03-04 NOTE — TOC Progression Note (Addendum)
Transition of Care [facility]) - Progression Note    Patient Details  Name: Joshua [name] MRN: [id number] Date of Birth: 06-11-1981  Transition of Care [facility]) CM/SW [place], RN Phone Number: 03/04/2021, 10:13 AM  Clinical Narrative:    Case management spoke with Delfin Gant, CM at [place] facility about hope for admission at the facility.  She states that she received the email for clinicals yesterday and will review them this morning and will return my call in regards to his possible admission.    Transitions of care plans are that if the patient is declined for LTC admission at [facility], then the patient will return home with family on Friday by car and will waiting for placement for SNF from the home environment in [place] in the meantime.  CM will continue to follow the patient for SNF versus home with home health services for discharge plans.  03/04/2021 - Spoke with [place] transport and they are unable to provide transportation for the patient for discharge to home or SNF admission since the driving distance is too far.  03/04/2021 1100 - CM spoke with Helene Kelp, [place] at [facility] in [place], [place] and they are unable to offer the patient an admission bed at this time since the patient's Medicaid is not covered for LTC placement since it is listed as SSI Medicaid and not LTC Medicaid.  I explained this to the family and explained to both the patient's sister, [name] and mother, [name] on the phone.  The family is aware that [facility] transportation and [facility] are unable to provide transportation to the family - since they are unwilling to pay 2700.00 dollars out of pocket.  The patient's family member - Darrick Penna - will plan to pick the patient up on Friday and drive him to the mother's home - listed as [address] [place], [place] [address].  [facility]  [name] / Judson Roch [phone]) will be providing home health services at the mother's home in [facility] -  listed in the discharge instructions.  The patient's discharge medications need to be sent to [facility] in [place], [place].  The patient will have PCP follow up with Dr. Juleen China in [place], [place].  [place] spoke with [facility] to follow up regarding hospital bed and hoyer lift - clinicals were faxed to [phone] and CM at facility will follow up with family about delivery of both to the home. The patient's mother is aware that they will call to set up delivery this week.  CM spoke with Carolynn Sayers, CM at [facility] to set up Enteral feeds for the patient.  Plan is for patient to be discharged home with family by car on Friday, 03/07/2021 with home health services.  03/04/2021 1200 - I spoke with [facility] home health agency and they are able to supply home health services and are aware that the patient will be discharging home with family on Friday, 03/07/2021 and will reach out to family to start home health services.  Carolynn Sayers, RNCM with [facility] was unable to bill the patient's [facility] Medicaid and now [facility] with [employer] will be following the patient for Enteral feeds support.  03/04/2021 - 1400 - Zack Blank with [employer] is unable to supply Enteral Feed supplies for home considering patient is discharging to [facility] home.  I called and left a message with Ross Marcus with [facility] and Clinicals and orders sent to [phone].  I spoke with Deidre Ala, OT, regarding family's plan to  take patient home by car on Friday.  PT/OT order placed for evaluation for discharge equipment needs.  Patient will be seen tomorrow and set up with appropriate dme for home.  I also spoke with [employer] and they receiving enteral feeds orders and clinicals via the fax machine and Ross Marcus, CM states that she will follow up regarding order needs for discharge and delivery to the home.  Expected Discharge Plan: [place] Barriers to Discharge: Continued Medical Work up  Expected Discharge Plan and  Services Expected Discharge Plan: [place] In-house Referral: Clinical Social Work Discharge Planning Services: CM Consult Post Acute Care Choice: Durable Medical [place] arrangements for the past 2 months: [place] (Discharging home to [place], [place])                 DME Arranged: Hospital bed (Specialty wheelchair thru - [place] in [facility]) [place]:  Richardson Landry Ward, ATT for specialty wheelchair - [phone];  [facility] in [place], [place]) Date DME Agency Contacted: 02/28/21 Time DME Agency Contacted: [phone] Representative spoke with at DME Agency: Richardson Landry Ward ATT - [phone] HH Arranged: RN,PT,OT,IV Antibiotics,Nurse's Aide [place] Agency: [place] Date [place]: 02/25/21   Representative spoke with at [place]: [name], [facility] in [facility] [phone]   Social Determinants of Health ([place]) Interventions    Readmission Risk Interventions Readmission Risk Prevention Plan 02/24/2021 07/04/2020  Transportation Screening Complete Complete  PCP or Specialist Appt within 5-7 Days - Complete  PCP or Specialist Appt within 3-5 Days Complete -  Home Care Screening - Complete  Medication Review (RN CM) - Complete  HRI or Home Care Consult Complete -  Social Work Consult for [place] Planning/Counseling Complete -  Palliative Care Screening Complete -  Medication Review Press photographer) Complete -  Some recent data might be hidden

## 2021-03-04 NOTE — Progress Notes (Signed)
OT Note  Spoke with CM about DC situation and need to assess transfers/positioning for car ride home. Pt has been set up with [facility] services in [facility]. Will follow up tomorrow.  Maurie Boettcher, OT/L   Acute OT Clinical Specialist Acute Rehabilitation Services Pager [phone] Office [phone]

## 2021-03-05 LAB — GLUCOSE, CAPILLARY
Glucose-Capillary: 102 mg/dL — ABNORMAL HIGH (ref 70–99)
Glucose-Capillary: 102 mg/dL — ABNORMAL HIGH (ref 70–99)
Glucose-Capillary: 104 mg/dL — ABNORMAL HIGH (ref 70–99)
Glucose-Capillary: 107 mg/dL — ABNORMAL HIGH (ref 70–99)
Glucose-Capillary: 122 mg/dL — ABNORMAL HIGH (ref 70–99)
Glucose-Capillary: 99 mg/dL (ref 70–99)

## 2021-03-05 MED ORDER — VANCOMYCIN HCL 750 MG/150ML IV SOLN
750.0000 mg | INTRAVENOUS | Status: AC
  Administered 2021-03-05 – 2021-03-06 (×2): 750 mg via INTRAVENOUS
  Filled 2021-03-05 (×2): qty 150

## 2021-03-05 MED ORDER — OSMOLITE 1.5 CAL PO LIQD
474.0000 mL | Freq: Three times a day (TID) | ORAL | Status: DC
  Administered 2021-03-05 – 2021-03-07 (×6): 474 mL

## 2021-03-05 MED ORDER — OSMOLITE 1.5 CAL PO LIQD
237.0000 mL | ORAL | Status: DC
  Administered 2021-03-05 – 2021-03-06 (×2): 237 mL

## 2021-03-05 MED ORDER — FREE WATER
150.0000 mL | Freq: Three times a day (TID) | Status: DC
  Administered 2021-03-05 – 2021-03-07 (×5): 150 mL

## 2021-03-05 NOTE — Progress Notes (Addendum)
Occupational Therapy Re-eval  Patient Details Name: Joshua [name] MRN: [id number] DOB: 1981/08/06 Today's Date: 03/05/2021    History of present illness Pt is a 40 y.o. male who presents with concerns for seizures. CT negative for acute intracranial abnormalities. Found with new chronic encephalopathy/suspected anoxic brain injury. 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   OT comments  OT re-evaluation for DME needs in plans for dc home with mother to [facility].  OT/PT co-eval completed.  Continues to require total assist +2 for ADLs, soiled in urine and stool upon entry with total assist for hygiene and brief change.  Completed bed mobility with total assist +2, then utilize bed pads and fitted sheet as "hammock" to anterior-posterior transfer patient into recliner and back to bed.  Patient tolerated transfer well, problem solving for car transfer and reached out to CM to find out more information about assist for car transfer, type of car (rear seat to transfer into), DME available, family availability for practice/training.  May need sliding board to bridge gap between car and chair for transfer.  Will follow acutely for family training and education to assist with transition home.    Follow Up Recommendations  Home health OT;Supervision/Assistance - 24 hour;Other (comment) ([place] aide)    Equipment Recommendations  Other (comment) (slideboard)    Recommendations for Other Services      Precautions / Restrictions Precautions Precautions: Fall Precaution Comments: seizures; at risk for skin breakdown and contractures Restrictions Weight Bearing Restrictions: No       Mobility Bed Mobility Overal bed mobility: Needs Assistance Bed Mobility: Rolling;Supine to Sit;Sit to Supine Rolling: Total assist;+2 for physical assistance;+2 for safety/equipment   Supine  to sit: Total assist;+2 for physical assistance;+2 for safety/equipment Sit to supine: Total assist;+2 for physical assistance;+2 for safety/equipment   General bed mobility comments: pt able to reach and assist with L UE but overall requires total assist +2    Transfers Overall transfer level: Needs assistance   Transfers: Anterior-Posterior Transfer       Anterior-Posterior transfers: Total assist;+2 physical assistance;+2 safety/equipment (+3 assist)   General transfer comment: A-P transfer to/from recliner with total assist using bedpad and sheet as hammock with total assist +3 to simulate car transfer    Balance Overall balance assessment: Needs assistance Sitting-balance support: No upper extremity supported;Feet supported Sitting balance-Leahy Scale: Zero Sitting balance - Comments: heavy posterior lean Postural control: Posterior lean;Right lateral lean                                 ADL either performed or assessed with clinical judgement   ADL Overall ADL's : At baseline                                       General ADL Comments: pt dependent in all ADL; total assist for hygiene and changing brief today     Vision       Perception     Praxis      Cognition Arousal/Alertness: Awake/alert Behavior During Therapy: Flat affect Overall Cognitive Status: History of cognitive impairments - at baseline  General Comments: pt following minimal commands with multimodal cueing        Exercises     Shoulder Instructions       General Comments      Pertinent Vitals/ Pain       Pain Assessment: Faces Faces Pain Scale: No hurt  Home Living Family/patient expects to be discharged to:: Private residence                                 Additional Comments: plan to dc to mothers home in [facility]      Prior Functioning/Environment Level of Independence: Needs assistance   Gait / Transfers Assistance Needed: bed bound, assisted for transfers/mobility ADL's / Homemaking Assistance Needed: fed via PEG; per chart review, dependent in ADLs   Comments: per prior evaluation, pt unable to report   Frequency  Min 2X/week        Progress Toward Goals  OT Goals(current goals can now be found in the care plan section)     Acute Rehab OT Goals Patient Stated Goal: none stated OT Goal Formulation: Patient unable to participate in goal setting Time For Goal Achievement: 03/19/21 Potential to Achieve Goals: Fair  Plan      Co-evaluation    PT/OT/SLP Co-Evaluation/Treatment: Yes Reason for Co-Treatment: Complexity of the patient's impairments (multi-system involvement);Necessary to address cognition/behavior during functional activity;For patient/therapist safety;To address functional/ADL transfers   OT goals addressed during session: Other (comment);Strengthening/ROM;ADL's and self-care (DME and transfers)      AM-PAC OT "6 Clicks" Daily Activity     Outcome Measure   Help from another person eating meals?: Total Help from another person taking care of personal grooming?: Total Help from another person toileting, which includes using toliet, bedpan, or urinal?: Total Help from another person bathing (including washing, rinsing, drying)?: Total Help from another person to put on and taking off regular upper body clothing?: Total Help from another person to put on and taking off regular lower body clothing?: Total 6 Click Score: 6    End of Session    OT Visit Diagnosis: Other abnormalities of gait and mobility (R26.89)   Activity Tolerance Patient tolerated treatment well   Patient Left in bed;with call bell/phone within reach;with bed alarm set   Nurse Communication Mobility status;Other (comment) (needing bath)        Time: [id number] OT Time Calculation (min): 42 min  Charges: OT General Charges $OT Visit: 1 Visit OT Evaluation $OT  Re-eval: 1 Re-eval OT Treatments $Self Care/Home Management : 8-22 mins  Jolaine Artist, OT Acute Rehabilitation Services Pager [phone] Office [phone]    Delight Stare 03/05/2021, 1:10 PM

## 2021-03-05 NOTE — TOC Progression Note (Signed)
Transition of Care [facility]) - Progression Note    Patient Details  Name: Joshua [name] MRN: [id number] Date of Birth: March 13, 1981  [facility]) CM/SW Contact  Curlene Labrum, RN Phone Number: 03/05/2021, 2:29 PM  Clinical Narrative:    Case management spoke with the patient's sister and mother on the phone regarding discharge plans for home on Friday.  The patient's family currently has a hospital bed and reclining transfer WC at the home in [place] through [place] at the brother's Erlene Quan) home.  I spoke with PT/OT and transfer slide was ordered through [employer] to be delivered to the hospital room.  PT/OT to reach out to [facility] (family and driver for Friday) at [phone].  Shelly, family transported the patient from [facility] to [place] recently.  PT/OT suggested a wheelchair ramp at the patient's mother's home and mother to find an ATV ramp at local hardware store.  The mother does not own the home and plans to use an ATV ramp since she is unable to modifying the current residence that has 3 steps to enter.  Three family members will be available to transfer the patient into the [facility] home when he arrives.  I spoke with the patient's mother and she is aware of the discharge plans on Friday and following healthcare agencies that will be assisting with care in the home.  [employer] was notified to call the patient's brother to pick up the existing hospital bed at the [facility] home.  CM will continue to follow the patient for discharge plans to home on Friday.   Expected Discharge Plan: [place] Barriers to Discharge: Continued Medical Work up  Expected Discharge Plan and Services Expected Discharge Plan: [place] In-house Referral: Clinical Social Work Discharge Planning Services: CM Consult Post Acute Care Choice: Durable Medical [place] arrangements for the past 2 months: [place] (Discharging home to [place], [place])                  DME Arranged: Hospital bed (Specialty wheelchair thru - [place] in [facility]) [place]:  Richardson Landry Ward, ATT for specialty wheelchair - [phone];  [facility] in [place], [place]) Date DME Agency Contacted: 02/28/21 Time DME Agency Contacted: [phone] Representative spoke with at DME Agency: Richardson Landry Ward ATT - [phone] HH Arranged: RN,PT,OT,IV Antibiotics,Nurse's Aide [place] Agency: [place] Date [place]: 02/25/21   Representative spoke with at [place]: [name], [facility] [place] [place] in [facility] [phone]   Social Determinants of Health ([place]) Interventions    Readmission Risk Interventions Readmission Risk Prevention Plan 02/24/2021 07/04/2020  Transportation Screening Complete Complete  PCP or Specialist Appt within 5-7 Days - Complete  PCP or Specialist Appt within 3-5 Days Complete -  Home Care Screening - Complete  Medication Review (RN CM) - Complete  HRI or Home Care Consult Complete -  Social Work Consult for [place] Planning/Counseling Complete -  Palliative Care Screening Complete -  Medication Review Press photographer) Complete -  Some recent data might be hidden

## 2021-03-05 NOTE — Progress Notes (Signed)
Nutrition Follow-up  DOCUMENTATION CODES:   Non-severe (moderate) malnutrition in context of chronic illness  INTERVENTION:   Transition to bolus tube feeding via PEG: -Provide 2 cans (467m) Osmolite 1.5 TID @ 0800, 1200, 1600 -Provide 1 can (2346m Osmolite 1.5 once per day @ 2000 -Flush with 6027mree water before and after each tube feeding bolus -Provide additional free water flush of 150m88mD   Tube feeding regimen provides 2485 kcal, 104 grams of protein, and 1267 ml free water Total free water with flushes: 2197 ml  NUTRITION DIAGNOSIS:   Moderate Malnutrition related to chronic illness (seizures, dysphagia) as evidenced by mild muscle depletion,mild fat depletion,severe muscle depletion,moderate muscle depletion,percent weight loss. -- updated  GOAL:   Patient will meet greater than or equal to 90% of their needs -- Met via TF  MONITOR:   TF tolerance,Labs,Weight trends,I & O's  REASON FOR ASSESSMENT:   Consult Assessment of nutrition requirement/status,Enteral/tube feeding initiation and management  ASSESSMENT:   Pt admitted with seizure in the setting of large L ICH s/p craniotomy, recurrent seizures. PMH includes large ICH (08/2019) requiring hemicraniectomy and evacuation followed by extensive hospitalization; pt discharged in May 2021. Pt has since experienced the development of seizures resulting in multiple ER visits/hospital admissions for concern for seizures/seizure-like activity  Pt likely to d/c home with home health services on 3/11 per MD and Case Manager.   Pt remains nonverbal.   Pt's diet was advanced from dysphagia 2 to a dysphagia 3 diet with thin liquids yesterday; however, PO intake remains poor. Two meals documented since last RD assessment, 2-30% consumption. Observed meal tray at time of visit, <5% completion. Pt continues to require tube feeds to meet 100% of nutritional needs. Tolerating TF per RN. Current TF: Osmolite 1.5 @ 60 ml/hr,  ProSource TF 45 ml BID, free water flushes 200 ml q 4 hours  Note pt is to d/c home with home health services, so will transition pt to bolus feeding   Medications reviewed. Labs: CBGs [phone] UOP documented  Able to complete NFPE at this visit, results below. Pt meets criteria for moderate malnutrition in context of chronic illness as evidenced by the depletions noted below; however, pt also meets criteria based on weight loss. Per weight readings, pt weighed 63 kg on 05/02/20 when being discharged from the hospital s/p hemicraniectomy. Pt has had recurrent seizures and hospitalizations since. Pt now weighs 48.1 kg, indicating a 23.5% weight loss x 10 months, which is clinically significant for time frame (was 43 kg around time of admit, 02/13/21, which would have been a 31.6% wt loss x 9 months).    NUTRITION - FOCUSED PHYSICAL EXAM:  Flowsheet Row Most Recent Value  Upper Arm Region Mild depletion  Thoracic and Lumbar Region Mild depletion  Buccal Region Mild depletion  Temple Region Mild depletion  Clavicle Bone Region Mild depletion  Clavicle and Acromion Bone Region Mild depletion  Scapular Bone Region Mild depletion  Dorsal Hand Moderate depletion  Patellar Region Severe depletion  Anterior Thigh Region Severe depletion  Posterior Calf Region Severe depletion  Edema (RD Assessment) None  Hair Reviewed  Eyes Reviewed  Mouth Unable to assess  [pt would not open mouth]  Skin Reviewed  Nails Reviewed      Diet Order:   Diet Order            DIET DYS 3 Room service appropriate? Yes; Fluid consistency: Thin  Diet effective now  EDUCATION NEEDS:   No education needs have been identified at this time  Skin:  Skin Assessment: Reviewed RN Assessment  Last BM:  3/8 type 5  Height:   Ht Readings from Last 1 Encounters:  02/06/21 6' (1.829 m)    Weight:   Wt Readings from Last 1 Encounters:  03/05/21 48.1 kg    BMI:  Body mass index is  14.38 kg/m.  Estimated Nutritional Needs:   Kcal:  2300-2500  Protein:  100-115 grams  Fluid:  >2L   Larkin Ina, MS, RD, LDN RD pager number and weekend/on-call pager number located in [place].

## 2021-03-05 NOTE — TOC Progression Note (Signed)
Transition of Care [facility]) - Progression Note    Patient Details  Name: Joshua [name] MRN: [id number] Date of Birth: 19-Feb-1981  Transition of [facility]) CM/SW [place], RN Phone Number: 03/05/2021, 10:15 AM  Clinical Narrative:    Case management spoke with [facility], [place] in [employer], and case manager requested additional clinicals and duplicate copy of facesheet sent to fax # [phone] to obtain hospital bed and hoyer lift.  The CM at [facility] states that they are planning to deliver the hospital bed to the mother's home address on Friday, 03/07/2021.  PT/OT is scheduled to evaluate the patient today for discharge dme equipment.    I spoke with Quillian Quince, RN the bedside nurse and interim nursing director on the unit and made them both aware that the patient would be transported home by car with family on 03/07/2021.  CM and MSW will continue to follow the patient for discharge planning needs.   Expected Discharge Plan: [place] Barriers to Discharge: Continued Medical Work up  Expected Discharge Plan and Services Expected Discharge Plan: [place] In-house Referral: Clinical Social Work Discharge Planning Services: CM Consult Post Acute Care Choice: Durable Medical [place] arrangements for the past 2 months: [place] (Discharging home to [place], [place])                 DME Arranged: Hospital bed (Specialty wheelchair thru - [place] in [facility]) [place]:  Richardson Landry Ward, ATT for specialty wheelchair - [phone];  [facility] [place] in [place], [place]) Date DME Agency Contacted: 02/28/21 Time DME Agency Contacted: [phone] Representative spoke with at DME Agency: Richardson Landry Ward ATT - [phone] HH Arranged: RN,PT,OT,IV Antibiotics,Nurse's Aide [place] Agency: [place] Date [place]: 02/25/21   Representative spoke with at [place]: [name], [facility] at [place] in [facility] [phone]   Social Determinants of Health ([place]) Interventions    Readmission Risk Interventions Readmission Risk Prevention Plan 02/24/2021 07/04/2020  Transportation Screening Complete Complete  PCP or Specialist Appt within 5-7 Days - Complete  PCP or Specialist Appt within 3-5 Days Complete -  Home Care Screening - Complete  Medication Review (RN CM) - Complete  HRI or Home Care Consult Complete -  Social Work Consult for [place] Planning/Counseling Complete -  Palliative Care Screening Complete -  Medication Review Press photographer) Complete -  Some recent data might be hidden

## 2021-03-05 NOTE — Care Management (Cosign Needed)
    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, PEG   The above medical condition requires: Patient requires the ability to reposition frequently   Head must be elevated greater than: 30 degrees   Bed type Semi-electric   Hoyer Lift Yes   Support Surface: Gel Overlay      02/28/21 1255   Unscheduled  For home use only DME Other see comment  Once       Comments: Slide board  Question:  Length of Need  Answer:  Lifetime   03/05/21 1403   Unscheduled  For home use only DME lightweight manual wheelchair with seat cushion  Once       Comments: Patient suffers from Right sided hemiparesis which impairs their ability to perform daily activities like [id number] in the home.  A walking [id number] will not resolve  issue with performing activities of daily living. A wheelchair will allow patient to safely perform daily activities. Patient is not able to propel themselves in the home using a standard weight wheelchair due to [id number]. Patient can self propel in the lightweight wheelchair. Length of need lifetime. Accessories: elevating leg rests (ELRs), wheel locks, extensions and anti-tippers.  I NEED a TRANSFER wheelchair that leans back.   03/05/21 1403

## 2021-03-05 NOTE — Plan of Care (Signed)
  Problem: Clinical Measurements: Goal: Ability to maintain clinical measurements within normal limits will improve Outcome: Progressing   Problem: Clinical Measurements: Goal: Will remain free from infection Outcome: Progressing   Problem: Health Behavior/Discharge Planning: Goal: Ability to manage health-related needs will improve Outcome: Progressing

## 2021-03-05 NOTE — Progress Notes (Signed)
[facility] PROGRESS NOTE  Joshua [name] [id number] DOB: October 20, 1981 DOA: 02/04/2021 PCP: Patient, [name]  Status: Remains inpatient appropriate because:Altered mental status, Unsafe d/c plan and Inpatient level of care appropriate due to severity of illness   Dispo: The patient is from: Home              Anticipated d/c is to: Home on 3/11 after completion of IV antibiotics              Patient currently is not medically stable to d/c.  Will complete antibiotics on 3/10.   Difficult to place patient Yes-needs LT SNF-has Medicaid appears to be active in [place] but and active in [place]/family would like to place him in [place] if possible; Pharmacotherapy has improved patient's behavioral response in context of acute anoxic brain injury.  As of 3/1 mittens are off.  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  3/4 [place] in [facility] have declined admission   Level of care: Telemetry Medical  Code Status: Full Family Communication: As of 3/8 both myself and Michelle Case manager have spoken with the patient's mother regarding update on clinical status and plans for discharge on 3/11 DVT prophylaxis: SCDs Vaccination status: 2/28 Spoke with his mother who agreed to Covid vaccine-order placed for Janssen vaccine that was given 2/28  Foley catheter: No  HPI: 40 year old male with a history of intracranial hemorrhage in 08/2019 requiring a hemicraniectomy and evacuation was discharged home in May 2021.  The patient has subsequently had multiple seizures with multiple ER visits for them. He presented on 02/04/2021 due to family's concern that he was having another seizure.  He had a leftward gaze which was noted in the ED as well.  Despite receiving Ativan, it did not improve and therefore he was given an IV Keppra load.   Subjective: Awake.  Entered room with very cheerful attitude and  discussed with patient that he would be going home in a few days to live with his mother.  He began smiling broadly and touched my hand.  Objective: Vitals:   03/05/21 0025 03/05/21 0425  BP: 105/83 124/81  Pulse: 83 75  Resp: 17 19  Temp: 98.7 F (37.1 C) 98.9 F (37.2 C)  SpO2: 100% 98%    Intake/Output Summary (Last 24 hours) at 03/05/2021 0747 Last data filed at 03/05/2021 [id number] Gross per 24 hour  Intake 2929 ml  Output --  Net 2929 ml   Filed Weights   02/27/21 0500 03/04/21 0500 03/05/21 0025  Weight: 50.4 kg 48.5 kg 48.1 kg    Exam:  Constitutional: Pleasant mood, no acute distress Respiratory: Bilateral lung sounds are clear, stable on room air with normal respiratory effort Cardiovascular: Normal heart sounds, pulse regular, no murmurs, Abdomen:  PEG tube site unremarkable for continuous tube feedings.  Site unremarkable.  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 Appears to have normal tone of left upper extremity which he uses purposefully Neurologic: Cranial nerves II through XII appear to be grossly intact -moves left upper extremity with purpose dense hemiplegia right side Psychiatric: Awake.  Smiled appropriately but given aphasia unable to and if truly understands what I am saying.  Also unable to accurately assess orientation.  Assessment/Plan: Acute problems: Seizure with new chronic encephalopathy/suspected anoxic brain injury -Likely secondary to prior brain injury related to intracranial hemorrhage -Prior to recent seizures and resultant  brain injury he had been able to eat orally -EEG ordered but then canceled as patient was noncooperative -Continue Keppra 1 g twice daily- Neurology has signed off  -Continue Seroquel 25 mg a.m. with 50 mg at HS. 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  Cardiology consulted for TEE but after consulting cardiologist reviewed echo did not feel that there were actual vegetations.  ID recommended a total of 7 days of antibiotics with last dose on 3/10  HTN with  H/o PRES - BP stable during the day but overnight SBP's over the past several nights have been low in the 80s to 90s.  Suspect improved blood pressure readings as agitation improved with the initiation of Seroquel - continue Norvasc decreased to 5 mg daily, continue hydralazine and Metoprolol for now and can decrease dosages if blood pressure remains suboptimal  Severe protein-calorie malnutrition, underweight Dysphagia Body mass index is 13.1 kg/m. -  He has PEG tube and is tolerating tube feeds - also on D 2 diet with thin liquids but is not eating or drinking (declines to) - continue Prostat supplement  - I have been speaking with nursing about making sure the abdominal binder is covering his PEG to protect it. Nutrition Problem: Inadequate oral intake Etiology: dysphagia Signs/Symptoms: meal completion < 25% Interventions: Prostat,Tube feeding  -SLP reevaluated on 3/2.  Continue D2 diet with thin liquids but patient will also need soft hold finger foods that he can self-feed.  We will also need to have staff at bedside to feed patient otherwise until oral  intake improves.  Other problems: CKD 3 - Creatinine stable   Data Reviewed: Basic Metabolic Panel: Recent Labs  Lab 03/01/21 0225 03/03/21 0945 03/04/21 0356  NA 136 138 137  K 4.1 4.1 4.1  CL 102 104 102  CO2 '27 24 27  '$ GLUCOSE 117* 110* 96  BUN 25* 26* 25*  CREATININE 1.33* 1.40* 1.33*  CALCIUM 9.4 9.5 9.6   Liver Function Tests: Recent Labs  Lab 03/01/21 0225 03/04/21 0356  AST 16 12*  ALT 14 13  ALKPHOS 60 61  BILITOT 0.6 0.6  PROT 6.6 6.6  ALBUMIN 3.1* 3.2*   No results for input(s): LIPASE, AMYLASE in the last 168 hours. No results for input(s): AMMONIA in the last 168 hours. CBC: Recent Labs  Lab 03/01/21 0225 03/04/21 0356  WBC 5.4 5.9  HGB 12.2* 12.2*  HCT 37.6* 38.0*  MCV 81.2 81.4  PLT 235 243   Cardiac Enzymes: No results for input(s): CKTOTAL, CKMB, CKMBINDEX, TROPONINI in the last 168 hours. BNP (last 3 results) No results for input(s): BNP in the last 8760 hours.  ProBNP (last 3 results) No results for input(s): PROBNP in the last 8760 hours.  CBG: Recent Labs  Lab 03/04/21 0757 03/04/21 1234 03/04/21 2019 03/05/21 0024 03/05/21 0423  GLUCAP 93 129* 102* 102* 102*  Recent Results (from the past 240 hour(s))  Culture, Urine     Status: Abnormal   Collection Time: 02/27/21  7:49 AM   Specimen: Urine, Catheterized  Result Value Ref Range Status   Specimen Description URINE, CATHETERIZED  Final   Special Requests   Final    NONE Performed at [facility], [address], [place], [place] [address]    Culture MULTIPLE SPECIES PRESENT, SUGGEST RECOLLECTION (A)  Final   Report Status 02/28/2021 FINAL  Final  Culture, blood (routine x 2)     Status: Abnormal   Collection Time: 02/27/21  8:50 AM   Specimen: BLOOD RIGHT HAND  Result Value Ref Range Status   Specimen Description BLOOD RIGHT HAND  Final   Special Requests   Final    BOTTLES DRAWN AEROBIC AND ANAEROBIC Blood Culture results may not be optimal due to an  inadequate volume of blood received in culture bottles   Culture  Setup Time   Final    GRAM POSITIVE COCCI IN CLUSTERS IN BOTH AEROBIC AND ANAEROBIC BOTTLES Organism ID to follow CRITICAL RESULT CALLED TO, READ BACK BY AND VERIFIED WITH: PHARMD T.JACKIE AT [id number] ON 02/28/2021 BY T.SAAD    Culture (A)  Final    STAPHYLOCOCCUS EPIDERMIDIS THE SIGNIFICANCE OF ISOLATING THIS ORGANISM FROM A SINGLE SET OF BLOOD CULTURES WHEN MULTIPLE SETS ARE DRAWN IS UNCERTAIN. PLEASE NOTIFY THE MICROBIOLOGY DEPARTMENT WITHIN ONE WEEK IF SPECIATION AND SENSITIVITIES ARE REQUIRED. Performed at [facility], [place] [address], [place], [place] [address]    Report Status 03/03/2021 FINAL  Final  Blood Culture ID Panel (Reflexed)     Status: Abnormal   Collection Time: 02/27/21  8:50 AM  Result Value Ref Range Status   Enterococcus faecalis NOT DETECTED NOT DETECTED Final   Enterococcus Faecium NOT DETECTED NOT DETECTED Final   Listeria monocytogenes NOT DETECTED NOT DETECTED Final   Staphylococcus species DETECTED (A) NOT DETECTED Final    Comment: CRITICAL RESULT CALLED TO, READ BACK BY AND VERIFIED WITH: PHARM D T.JACKIE AT [id number] ON 02/28/2021 BY T.SAAD.    Staphylococcus aureus (BCID) NOT DETECTED NOT DETECTED Final   Staphylococcus epidermidis DETECTED (A) NOT DETECTED Final    Comment: Methicillin (oxacillin) resistant coagulase negative staphylococcus. Possible blood culture contaminant (unless isolated from more than one blood culture draw or clinical case suggests pathogenicity). No antibiotic treatment is indicated for blood  culture contaminants. CRITICAL RESULT CALLED TO, READ BACK BY AND VERIFIED WITH: PHARM D T.JACKIE AT [id number] ON 02/28/2021 BY T.SAAD.    Staphylococcus lugdunensis NOT DETECTED NOT DETECTED Final   Streptococcus species NOT DETECTED NOT DETECTED Final   Streptococcus agalactiae NOT DETECTED NOT DETECTED Final   Streptococcus pneumoniae NOT DETECTED NOT DETECTED Final    Streptococcus pyogenes NOT DETECTED NOT DETECTED Final   A.calcoaceticus-baumannii NOT DETECTED NOT DETECTED Final   Bacteroides fragilis NOT DETECTED NOT DETECTED Final   Enterobacterales NOT DETECTED NOT DETECTED Final   Enterobacter cloacae complex NOT DETECTED NOT DETECTED Final   Escherichia coli NOT DETECTED NOT DETECTED Final   Klebsiella aerogenes NOT DETECTED NOT DETECTED Final   Klebsiella oxytoca NOT DETECTED NOT DETECTED Final   Klebsiella pneumoniae NOT DETECTED NOT DETECTED Final   Proteus species NOT DETECTED NOT DETECTED Final   Salmonella species NOT DETECTED NOT DETECTED Final   Serratia marcescens NOT DETECTED NOT DETECTED Final   Haemophilus influenzae NOT DETECTED NOT DETECTED Final   Neisseria meningitidis NOT DETECTED NOT DETECTED Final  Pseudomonas aeruginosa NOT DETECTED NOT DETECTED Final   Stenotrophomonas maltophilia NOT DETECTED NOT DETECTED Final   Candida albicans NOT DETECTED NOT DETECTED Final   Candida auris NOT DETECTED NOT DETECTED Final   Candida glabrata NOT DETECTED NOT DETECTED Final   Candida krusei NOT DETECTED NOT DETECTED Final   Candida parapsilosis NOT DETECTED NOT DETECTED Final   Candida tropicalis NOT DETECTED NOT DETECTED Final   Cryptococcus neoformans/gattii NOT DETECTED NOT DETECTED Final   Methicillin resistance mecA/C DETECTED (A) NOT DETECTED Final    Comment: CRITICAL RESULT CALLED TO, READ BACK BY AND VERIFIED WITH: PHARM D T.JACKIE AT [id number] ON 02/28/2021 BY T.SAAD. Performed at [facility], [place] [address], [place], [place] [address]   Culture, blood (routine x 2)     Status: Abnormal   Collection Time: 02/27/21  8:57 AM   Specimen: BLOOD RIGHT WRIST  Result Value Ref Range Status   Specimen Description BLOOD RIGHT WRIST  Final   Special Requests   Final    AEROBIC BOTTLE ONLY Blood Culture results may not be optimal due to an inadequate volume of blood received in culture bottles   Culture  Setup Time   Final     GRAM POSITIVE COCCI IN CLUSTERS AEROBIC BOTTLE ONLY CRITICAL VALUE NOTED.  VALUE IS CONSISTENT WITH PREVIOUSLY REPORTED AND CALLED VALUE.    Culture (A)  Final    STAPHYLOCOCCUS HOMINIS THE SIGNIFICANCE OF ISOLATING THIS ORGANISM FROM A SINGLE SET OF BLOOD CULTURES WHEN MULTIPLE SETS ARE DRAWN IS UNCERTAIN. PLEASE NOTIFY THE MICROBIOLOGY DEPARTMENT WITHIN ONE WEEK IF SPECIATION AND SENSITIVITIES ARE REQUIRED. Performed at [facility], [place] [address], [place], [place] [address]    Report Status 03/01/2021 FINAL  Final     Studies: No results found.  Scheduled Meds: . amLODipine  5 mg Per Tube q AM  . feeding supplement (PROSource TF)  45 mL Per Tube BID  . free water  200 mL Per Tube Q4H  . hydrALAZINE  25 mg Per Tube Q8H  . levETIRAcetam  1,000 mg Per Tube Q12H  . metoprolol tartrate  50 mg Per Tube BID  . naltrexone  25 mg Per Tube Daily  . QUEtiapine  25 mg Per Tube Daily  . QUEtiapine  50 mg Per Tube QHS   Continuous Infusions: . feeding supplement (OSMOLITE 1.5 CAL) 60 mL/hr at 03/05/21 0400  . 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   Essential hypertension   Encephalopathy chronic   Staphylococcus epidermidis bacteremia   Bacteremia due to methicillin resistant Staphylococcus epidermidis   Aortic valve endocarditis   Vegetative endocarditis of mitral valve   Septic phlebitis   Consultants:  Neurology  Procedures:  None  Antibiotics: Anti-infectives (From admission, onward)   Start     Dose/Rate Route Frequency Ordered Stop   03/01/21 1000  vancomycin (VANCOREADY) IVPB 1000 mg/200 mL        1,000 mg 200 mL/hr over 60 Minutes Intravenous Every 24 hours 02/28/21 0907     02/28/21 1000  vancomycin (VANCOREADY) IVPB 1250 mg/250 mL        1,250 mg 166.7 mL/hr over 90 Minutes Intravenous  Once 02/28/21 0907 02/28/21 1214        Time spent: 20 minutes    Erin Hearing ANP  [facility] 7 am - 330 pm/M-F for direct  patient care and secure chat Please refer to Amion for contact info 28  days

## 2021-03-05 NOTE — Progress Notes (Signed)
Pharmacy Antibiotic Note  Joshua [name] is a 40 y.o. male admitted on 02/04/2021 with MRSE bacteremia thought to be related to PIV - now removed. ID consulted - plan is for 7d of antibiotics.  Pharmacy has been consulted for vancomycin dosing.  Will reduce the Vancomycin dose slightly to complete the remainder of the 7d course based on most recent VP/VT levels. Added 7d stop date for 3/10 per ID recs.   Plan: - Reduce Vancomycin to 750 mg IV every 24 hours - End date 3/10 per ID recs for 7d LOT - added stop date - Will continue to follow renal function, culture results, LOT, and antibiotic de-escalation plans   Height: 6' (182.9 cm) Weight: 48.1 kg (106 lb) IBW/kg (Calculated) : 77.6  Temp (24hrs), Avg:98.6 F (37 C), Min:97.9 F (36.6 C), Max:98.9 F (37.2 C)  Recent Labs  Lab 03/01/21 0225 03/03/21 0945 03/03/21 1408 03/04/21 0356  WBC 5.4  --   --  5.9  CREATININE 1.33* 1.40*  --  1.33*  VANCOTROUGH  --  12*  --   --   VANCOPEAK  --   --  35  --     Estimated Creatinine Clearance: 49.7 mL/min (A) (by C-G formula based on SCr of 1.33 mg/dL (H)).    No Known Allergies  Antimicrobials this admission: 3/4 vancomycin >>   Dose adjustments this admission: None  Microbiology results: 3/4  BCx: CNS 3/4 bottles  Thank you for allowing pharmacy to be a part of this patient's care.  Alycia Rossetti, PharmD, BCPS Clinical Pharmacist Clinical phone for 03/05/2021: [phone] 03/05/2021 9:32 AM   **Pharmacist phone directory can now be found on [email or web address] ([email or web address]).  Listed under [place].

## 2021-03-05 NOTE — Progress Notes (Signed)
Physical Therapy Re-Evaluation Patient Details Name: Joshua [name] MRN: [id number] DOB: 07/01/1981 Today's Date: 03/05/2021   History of Present Illness  Pt is a 40 y.o. male who presents with concerns for seizures. CT negative for acute intracranial abnormalities. Found with new chronic encephalopathy/suspected anoxic brain injury. 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  Clinical Impression  PT re-evaluation completed for DME needs and car transfer to assist with return home with mother in [facility]. PT/OT co-eval completed. TotalA+2 for bed mobility to perform pericare as patient and linens were soiled with urine and stool on arrival. TotalA+3 for anterior-posterior transfer with use of bed pads and fitted sheet as "hammock" to complete, patient tolerated transfer well. PT and OT problem solved car transfer and reached out to CM for more information about assist for car transfer, type of car (rear seat to transfer into), DME available, family availability for training. PT will follow acutely for family training and education to assist with return home.     Follow Up Recommendations Other (comment);No PT follow up [facility] aide)    [facility] bed (slide board)    Recommendations for Other Services       Precautions / Restrictions Precautions Precautions: Fall Precaution Comments: seizures; at risk for skin breakdown and contractures Restrictions Weight Bearing Restrictions: No      Mobility  Bed Mobility Overal bed mobility: Needs Assistance Bed Mobility: Rolling;Supine to Sit;Sit to Supine Rolling: Total assist;+2 for physical assistance;+2 for safety/equipment   Supine to sit: Total assist;+2 for physical assistance;+2 for safety/equipment Sit to supine: Total assist;+2 for physical assistance;+2 for safety/equipment   General  bed mobility comments: pt able to reach and assist with L UE but overall requires total assist +2    Transfers Overall transfer level: Needs assistance   Transfers: Anterior-Posterior Transfer       Anterior-Posterior transfers: Total assist;+2 physical assistance;+2 safety/equipment (+3 assist)   General transfer comment: A-P transfer to/from recliner with total assist using bedpad and sheet as hammock with total assist +3 to simulate car transfer  Ambulation/Gait                Stairs            Wheelchair Mobility    Modified Rankin (Stroke Patients Only)       Balance Overall balance assessment: Needs assistance Sitting-balance support: No upper extremity supported;Feet supported Sitting balance-Leahy Scale: Zero Sitting balance - Comments: heavy posterior lean Postural control: Posterior lean;Right lateral lean                                   Pertinent Vitals/Pain Pain Assessment: Faces Faces Pain Scale: No hurt    Home Living Family/patient expects to be discharged to:: Private residence                 Additional Comments: plan to dc to mothers home in [facility]    Prior Function Level of Independence: Needs assistance   Gait / Transfers Assistance Needed: bed bound, assisted for transfers/mobility  ADL's / Homemaking Assistance Needed: fed via PEG; per chart review, dependent in ADLs  Comments: per prior evaluation, pt unable to report     Hand Dominance        Extremity/Trunk Assessment   Upper Extremity Assessment Upper Extremity  Assessment: Defer to OT evaluation RUE Deficits / Details: Strength and ROM deficits with pt holding R arm in flexion synergy with contractures limiting R elbow PROM extension to ~80' and opening of R hand with PROM to less than mid-way RUE Coordination: decreased fine motor;decreased gross motor LUE Deficits / Details: Generalizes weakness and coordination deficits impacting use for mobility  and ADLs; able to use functionally to reach towards therapist hand/rail    Lower Extremity Assessment Lower Extremity Assessment: RLE deficits/detail;LLE deficits/detail RLE Deficits / Details: Generalized weakness with contractures. PROM knee extension ~90'. PROM ankle dorsiflexion ~neutral. PROM hip abduction lacking ~10-20' to obtain neutral as he holds them in adduction RLE Coordination: decreased fine motor;decreased gross motor LLE Deficits / Details: Generalized weakness with contractures. PROM knee extension ~110'. PROM ankle dorsiflexion ~neutral. PROM hip abduction lacking ~10-20' to obtain neutral as he holds them in adduction       Communication   Communication: Receptive difficulties;Expressive difficulties  Cognition Arousal/Alertness: Awake/alert Behavior During Therapy: Flat affect Overall Cognitive Status: History of cognitive impairments - at baseline                                 General Comments: pt following minimal commands with multimodal cueing      General Comments      Exercises     Assessment/Plan    PT Assessment Patient needs continued PT services  PT Problem List Decreased strength;Decreased range of motion;Decreased activity tolerance;Decreased balance;Decreased mobility;Decreased coordination;Decreased cognition;Decreased knowledge of use of DME;Decreased safety awareness;Decreased knowledge of precautions;Impaired tone;Decreased skin integrity       PT Treatment Interventions DME instruction;Functional mobility training;Therapeutic exercise;Manual techniques;Wheelchair mobility training;Patient/family education    PT Goals (Current goals can be found in the Care Plan section)  Acute Rehab PT Goals Patient Stated Goal: none stated PT Goal Formulation: Patient unable to participate in goal setting Time For Goal Achievement: 03/19/21 Potential to Achieve Goals: Fair Additional Goals Additional Goal #1: Patient/family will  demonstrate ability to complete anterior posterior transfer with independence    Frequency Min 2X/week   Barriers to discharge        Co-evaluation PT/OT/SLP Co-Evaluation/Treatment: Yes Reason for Co-Treatment: Complexity of the patient's impairments (multi-system involvement);Necessary to address cognition/behavior during functional activity;For patient/therapist safety;To address functional/ADL transfers PT goals addressed during session: Mobility/safety with mobility;Balance (DME and transfers) OT goals addressed during session: Other (comment);Strengthening/ROM;ADL's and self-care (DME and transfers)       AM-PAC PT "6 Clicks" Mobility  Outcome Measure Help needed turning from your back to your side while in a flat bed without using bedrails?: Total Help needed moving from lying on your back to sitting on the side of a flat bed without using bedrails?: Total Help needed moving to and from a bed to a chair (including a wheelchair)?: Total Help needed standing up from a chair using your arms (e.g., wheelchair or bedside chair)?: Total Help needed to walk in hospital room?: Total Help needed climbing 3-5 steps with a railing? : Total 6 Click Score: 6    End of Session   Activity Tolerance: Patient tolerated treatment well Patient left: in bed;with call bell/phone within reach;with bed alarm set Nurse Communication: Mobility status PT Visit Diagnosis: Muscle weakness (generalized) (M62.81)    Time: [id number] PT Time Calculation (min) (ACUTE ONLY): 42 min   Charges:   PT Evaluation $PT Re-evaluation: 1 Re-eval PT Treatments $Therapeutic Activity: 23-37 mins  Correne Lalani A. Gilford Rile PT, DPT Acute Rehabilitation Services Pager [phone] Office [phone]   Linna Hoff 03/05/2021, 2:59 PM

## 2021-03-06 ENCOUNTER — Other Ambulatory Visit: Payer: Self-pay | Admitting: Nurse Practitioner

## 2021-03-06 DIAGNOSIS — E440 Moderate protein-calorie malnutrition: Secondary | ICD-10-CM | POA: Insufficient documentation

## 2021-03-06 LAB — GLUCOSE, CAPILLARY
Glucose-Capillary: 146 mg/dL — ABNORMAL HIGH (ref 70–99)
Glucose-Capillary: 75 mg/dL (ref 70–99)
Glucose-Capillary: 86 mg/dL (ref 70–99)
Glucose-Capillary: 91 mg/dL (ref 70–99)
Glucose-Capillary: 93 mg/dL (ref 70–99)
Glucose-Capillary: 95 mg/dL (ref 70–99)

## 2021-03-06 MED ORDER — QUETIAPINE FUMARATE 50 MG PO TABS: 50.0000 mg | ORAL_TABLET | Freq: Every day | ORAL | 0 refills | Status: DC

## 2021-03-06 MED ORDER — AMLODIPINE BESYLATE 5 MG PO TABS: 5.0000 mg | ORAL_TABLET | Freq: Every day | ORAL | 0 refills | Status: DC

## 2021-03-06 MED ORDER — AMLODIPINE BESYLATE 5 MG PO TABS: 5.0000 mg | ORAL_TABLET | Freq: Every day | ORAL | 0 refills | Status: AC

## 2021-03-06 MED ORDER — QUETIAPINE FUMARATE 50 MG PO TABS: 50.0000 mg | ORAL_TABLET | Freq: Every day | ORAL | 0 refills | Status: AC

## 2021-03-06 MED ORDER — LEVETIRACETAM 100 MG/ML PO SOLN: 1000.0000 mg | Freq: Two times a day (BID) | ORAL | 0 refills | Status: AC

## 2021-03-06 MED ORDER — HYDRALAZINE HCL 25 MG PO TABS: 25.0000 mg | ORAL_TABLET | Freq: Three times a day (TID) | ORAL | 0 refills | Status: DC

## 2021-03-06 MED ORDER — QUETIAPINE FUMARATE 25 MG PO TABS: 25.0000 mg | ORAL_TABLET | Freq: Every day | ORAL | 0 refills | Status: DC

## 2021-03-06 MED ORDER — OSMOLITE 1.5 CAL PO LIQD: 474.0000 mL | Freq: Three times a day (TID) | ORAL | 0 refills | Status: AC

## 2021-03-06 MED ORDER — NALTREXONE HCL 50 MG PO TABS: 25.0000 mg | ORAL_TABLET | Freq: Every day | ORAL | 0 refills | Status: AC

## 2021-03-06 MED ORDER — LEVETIRACETAM 100 MG/ML PO SOLN: 1000.0000 mg | Freq: Two times a day (BID) | ORAL | 0 refills | Status: DC

## 2021-03-06 MED ORDER — QUETIAPINE FUMARATE 25 MG PO TABS
25.0000 mg | ORAL_TABLET | Freq: Every day | ORAL | 0 refills | Status: AC
Start: 2021-03-07 — End: ?

## 2021-03-06 MED ORDER — NALTREXONE HCL 50 MG PO TABS: 25.0000 mg | ORAL_TABLET | Freq: Every day | ORAL | 0 refills | Status: DC

## 2021-03-06 MED ORDER — HYDRALAZINE HCL 25 MG PO TABS: 25.0000 mg | ORAL_TABLET | Freq: Three times a day (TID) | ORAL | 0 refills | Status: AC

## 2021-03-06 MED FILL — LEVETIRACETAM 100 MG/ML SOL: 100 | 23 days supply | Qty: 473 | Fill #0

## 2021-03-06 MED FILL — QUETIAPINE FUMARATE 50 MG T: 50 | 30 days supply | Qty: 30 | Fill #0

## 2021-03-06 MED FILL — hydrALAZINE HCL 25 MG TABS: 25 | 30 days supply | Qty: 90 | Fill #0

## 2021-03-06 MED FILL — QUETIAPINE FUMARATE 25 MG T: 25 | 30 days supply | Qty: 30 | Fill #0

## 2021-03-06 MED FILL — NALTREXONE 50 MG TABLET: 50 | 30 days supply | Qty: 15 | Fill #0

## 2021-03-06 MED FILL — AMLODIPINE BESYLATE 5 MG TA: 5 | 30 days supply | Qty: 30 | Fill #0

## 2021-03-06 NOTE — Progress Notes (Signed)
[facility] PROGRESS NOTE  Trigger Koskie [id number] DOB: 12-21-1981 DOA: 02/04/2021 PCP: Patient, [name]  Status: Remains inpatient appropriate because:Altered mental status, Unsafe d/c plan and Inpatient level of care appropriate due to severity of illness   Dispo: The patient is from: Home              Anticipated d/c is to: Home on 3/11 after completion of IV antibiotics              Patient currently is not medically stable to d/c.  Will complete antibiotics on 3/10.   Difficult to place patient Yes-needs LT SNF-has Medicaid appears to be active in [place] but and active in [place]/family would like to place him in [place] if possible; Pharmacotherapy has improved patient's behavioral response in context of acute anoxic brain injury.  As of 3/1 mittens are off.  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 DVT prophylaxis: SCDs Vaccination status: 2/28 Spoke with his mother who agreed to Covid vaccine-order placed for Janssen vaccine that was given 2/28  Foley catheter: No  HPI: 40 year old male with a history of intracranial hemorrhage in 08/2019 requiring a hemicraniectomy and evacuation was discharged home in May 2021.  The patient has subsequently had multiple seizures with multiple ER visits for them. He presented on 02/04/2021 due to family's concern that he was having another seizure.  He had a leftward gaze which was noted in the ED as well.   Despite receiving Ativan, it did not improve and therefore he was given an IV Keppra load.   Subjective: Awakened.  Somewhat withdrawn today and not smiling.  Objective: Vitals:   03/06/21 0001 03/06/21 0422  BP: 115/81 (!) 128/98  Pulse: 83 64  Resp: 18 19  Temp: 97.7 F (36.5 C) 97.6 F (36.4 C)  SpO2: 98% 100%   No intake or output data in the 24 hours ending 03/06/21 0742 Filed Weights   03/04/21 0500 03/05/21 0025 03/06/21 0422  Weight: 48.5 kg 48.1 kg 47.6 kg    Exam:  Constitutional: Alert, calm, no acute distress Respiratory: Posterior lung sounds clear to auscultation and he remained stable on room air Cardiovascular: Heart sounds are S1-S2, no peripheral edema, skin warm and dry, pulse regular Abdomen: Soft nontender with normoactive bowel sounds.  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 Appears to have normal tone of left upper extremity which he uses purposefully Neurologic: Cranial nerves II through XII appear to be grossly intact -moves left upper extremity with purpose dense hemiplegia right side Psychiatric: Awakened, affect more flat and not smiling today.  Aphasic so unable to accurately assess orientation    Assessment/Plan: Acute problems: Seizure with new chronic encephalopathy/suspected anoxic brain injury -Likely secondary to prior brain injury related to intracranial hemorrhage -Prior to recent seizures and resultant brain injury he had been able to eat orally -EEG ordered but then canceled as patient was noncooperative -Continue Keppra 1 g twice daily-  Neurology has signed off  -Continue Seroquel 25 mg a.m. with 50 mg at HS. 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  Cardiology consulted for TEE but after consulting cardiologist reviewed echo did not feel that there were actual vegetations.  ID recommended a total of 7 days of antibiotics with last dose on 3/10  HTN with  H/o PRES - BP stable during the day but overnight SBP's over the past several nights have been low in the 80s to 90s.  Suspect improved blood pressure readings as agitation improved with the initiation of Seroquel - continue Norvasc decreased to 5 mg daily, continue hydralazine and Metoprolol for now and can decrease dosages if blood pressure remains suboptimal  Severe protein-calorie malnutrition, underweight Dysphagia Body mass index is 13.1 kg/m. -  He has PEG tube and is tolerating tube feeds - also on D 2 diet with thin liquids but is not eating or drinking (declines to) - continue Prostat supplement  - I have been speaking with nursing about making sure the abdominal binder is covering his PEG to protect it. Nutrition Problem: Moderate Malnutrition Etiology: chronic illness (seizures, dysphagia) Signs/Symptoms: mild muscle depletion,mild fat depletion,severe muscle depletion,moderate muscle depletion,percent weight loss Percent weight loss: 23.5 % (23.5% x 10 months (based on most recent weight reading) or 31.6% x 9 months if using admission weight.) Interventions: Prostat,Tube feeding  -SLP reevaluated on 3/2.  Continue D2 diet with thin liquids  but patient will also need soft hold finger foods that he can self-feed.  We will also need to have staff at bedside to feed patient otherwise until oral intake improves.  Other problems: CKD 3 - Creatinine stable   Data Reviewed: Basic Metabolic Panel: Recent Labs  Lab 03/01/21 0225 03/03/21 0945 03/04/21 0356  NA 136 138 137  K 4.1 4.1 4.1  CL 102 104 102  CO2 '27 24 27  '$ GLUCOSE 117* 110* 96  BUN 25* 26* 25*  CREATININE 1.33* 1.40* 1.33*  CALCIUM 9.4 9.5 9.6   Liver Function Tests: Recent Labs  Lab 03/01/21 0225 03/04/21 0356  AST 16 12*  ALT 14 13  ALKPHOS 60 61  BILITOT 0.6 0.6  PROT 6.6 6.6  ALBUMIN 3.1* 3.2*   No results for input(s): LIPASE, AMYLASE in the last 168 hours. No results for input(s): AMMONIA in the last 168 hours. CBC: Recent Labs  Lab 03/01/21 0225 03/04/21 0356  WBC 5.4 5.9  HGB 12.2* 12.2*  HCT 37.6* 38.0*  MCV 81.2 81.4  PLT 235 243   Cardiac Enzymes: No results for input(s): CKTOTAL, CKMB, CKMBINDEX, TROPONINI in the last 168 hours. BNP (last 3 results) No results for input(s): BNP in the last 8760 hours.  ProBNP (last 3 results) No results for input(s): PROBNP in the last 8760 hours.  CBG: Recent Labs  Lab 03/05/21 1241 03/05/21 1656 03/05/21 2026 03/06/21 0002 03/06/21 0424  GLUCAP 104* 99 122* 146* 95    Recent Results (from the past 240 hour(s))  Culture, Urine     Status: Abnormal   Collection Time: 02/27/21  7:49 AM   Specimen: Urine, Catheterized  Result Value Ref Range Status   Specimen Description URINE, CATHETERIZED  Final   Special Requests   Final    NONE Performed at [facility], [place] [address] [place], [place] [address]    Culture MULTIPLE SPECIES PRESENT, SUGGEST RECOLLECTION (A)  Final   Report Status 02/28/2021 FINAL  Final  Culture, blood (routine x 2)     Status: Abnormal   Collection Time: 02/27/21  8:50 AM   Specimen: BLOOD RIGHT HAND  Result Value Ref Range Status   Specimen  Description BLOOD RIGHT HAND  Final   Special Requests   Final    BOTTLES DRAWN AEROBIC AND ANAEROBIC Blood Culture results may not be optimal due to an inadequate volume of blood received in culture bottles   Culture  Setup Time   Final    GRAM POSITIVE COCCI IN CLUSTERS IN BOTH AEROBIC AND ANAEROBIC BOTTLES Organism ID to follow CRITICAL RESULT CALLED TO, READ BACK BY AND VERIFIED WITH: PHARMD T.JACKIE AT [id number] ON 02/28/2021 BY T.SAAD    Culture (A)  Final    STAPHYLOCOCCUS EPIDERMIDIS THE SIGNIFICANCE OF ISOLATING THIS ORGANISM FROM A SINGLE SET OF BLOOD CULTURES WHEN MULTIPLE SETS ARE DRAWN IS UNCERTAIN. PLEASE NOTIFY THE MICROBIOLOGY DEPARTMENT WITHIN ONE WEEK IF SPECIATION AND SENSITIVITIES ARE REQUIRED. Performed at [facility], [place] [address] [place], [place] [address]    Report Status 03/03/2021 FINAL  Final  Blood Culture ID Panel (Reflexed)     Status: Abnormal   Collection Time: 02/27/21  8:50 AM  Result Value Ref Range Status   Enterococcus faecalis NOT DETECTED NOT DETECTED Final   Enterococcus Faecium NOT DETECTED NOT DETECTED Final   Listeria monocytogenes NOT DETECTED NOT DETECTED Final   Staphylococcus species DETECTED (A) NOT DETECTED Final    Comment: CRITICAL RESULT CALLED TO, READ BACK BY AND VERIFIED WITH: PHARM D T.JACKIE AT [id number] ON 02/28/2021 BY T.SAAD.    Staphylococcus aureus (BCID) NOT DETECTED NOT DETECTED Final   Staphylococcus epidermidis DETECTED (A) NOT DETECTED Final    Comment: Methicillin (oxacillin) resistant coagulase negative staphylococcus. Possible blood culture contaminant (unless isolated from more than one blood culture draw or clinical case suggests pathogenicity). No antibiotic treatment is indicated for blood  culture contaminants. CRITICAL RESULT CALLED TO, READ BACK BY AND VERIFIED WITH: PHARM D T.JACKIE AT [id number] ON 02/28/2021 BY T.SAAD.    Staphylococcus lugdunensis NOT DETECTED NOT DETECTED Final   Streptococcus species NOT  DETECTED NOT DETECTED Final   Streptococcus agalactiae NOT DETECTED NOT DETECTED Final   Streptococcus pneumoniae NOT DETECTED NOT DETECTED Final   Streptococcus pyogenes NOT DETECTED NOT DETECTED Final   A.calcoaceticus-baumannii NOT DETECTED NOT DETECTED Final   Bacteroides fragilis NOT DETECTED NOT DETECTED Final   Enterobacterales NOT DETECTED NOT DETECTED Final   Enterobacter cloacae complex NOT DETECTED NOT DETECTED Final   Escherichia coli NOT DETECTED NOT DETECTED Final   Klebsiella aerogenes NOT DETECTED NOT DETECTED Final   Klebsiella oxytoca NOT DETECTED NOT DETECTED Final   Klebsiella pneumoniae NOT DETECTED NOT DETECTED Final   Proteus species NOT DETECTED NOT DETECTED Final   Salmonella species NOT DETECTED NOT DETECTED Final   Serratia marcescens NOT DETECTED NOT DETECTED Final   Haemophilus influenzae NOT DETECTED NOT DETECTED Final  Neisseria meningitidis NOT DETECTED NOT DETECTED Final   Pseudomonas aeruginosa NOT DETECTED NOT DETECTED Final   Stenotrophomonas maltophilia NOT DETECTED NOT DETECTED Final   Candida albicans NOT DETECTED NOT DETECTED Final   Candida auris NOT DETECTED NOT DETECTED Final   Candida glabrata NOT DETECTED NOT DETECTED Final   Candida krusei NOT DETECTED NOT DETECTED Final   Candida parapsilosis NOT DETECTED NOT DETECTED Final   Candida tropicalis NOT DETECTED NOT DETECTED Final   Cryptococcus neoformans/gattii NOT DETECTED NOT DETECTED Final   Methicillin resistance mecA/C DETECTED (A) NOT DETECTED Final    Comment: CRITICAL RESULT CALLED TO, READ BACK BY AND VERIFIED WITH: PHARM D T.JACKIE AT [id number] ON 02/28/2021 BY T.SAAD. Performed at [facility], [place] [address] [place], [place] [address]   Culture, blood (routine x 2)     Status: Abnormal   Collection Time: 02/27/21  8:57 AM   Specimen: BLOOD RIGHT WRIST  Result Value Ref Range Status   Specimen Description BLOOD RIGHT WRIST  Final   Special Requests   Final    AEROBIC  BOTTLE ONLY Blood Culture results may not be optimal due to an inadequate volume of blood received in culture bottles   Culture  Setup Time   Final    GRAM POSITIVE COCCI IN CLUSTERS AEROBIC BOTTLE ONLY CRITICAL VALUE NOTED.  VALUE IS CONSISTENT WITH PREVIOUSLY REPORTED AND CALLED VALUE.    Culture (A)  Final    STAPHYLOCOCCUS HOMINIS THE SIGNIFICANCE OF ISOLATING THIS ORGANISM FROM A SINGLE SET OF BLOOD CULTURES WHEN MULTIPLE SETS ARE DRAWN IS UNCERTAIN. PLEASE NOTIFY THE MICROBIOLOGY DEPARTMENT WITHIN ONE WEEK IF SPECIATION AND SENSITIVITIES ARE REQUIRED. Performed at [facility], [place] [address] [place], [place] [address]    Report Status 03/01/2021 FINAL  Final     Studies: No results found.  Scheduled Meds: . amLODipine  5 mg Per Tube q AM  . feeding supplement (OSMOLITE 1.5 CAL)  237 mL Per Tube Q24H  . feeding supplement (OSMOLITE 1.5 CAL)  474 mL Per Tube TID  . free water  150 mL Per Tube TID  . hydrALAZINE  25 mg Per Tube Q8H  . levETIRAcetam  1,000 mg Per Tube Q12H  . metoprolol tartrate  50 mg Per Tube BID  . naltrexone  25 mg Per Tube Daily  . QUEtiapine  25 mg Per Tube Daily  . QUEtiapine  50 mg Per Tube QHS   Continuous Infusions: . 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   Essential hypertension   Encephalopathy chronic   Staphylococcus epidermidis bacteremia   Bacteremia due to methicillin resistant Staphylococcus epidermidis   Aortic valve endocarditis   Vegetative endocarditis of mitral valve   Septic phlebitis   Malnutrition of moderate degree   Consultants:  Neurology  Procedures:  None  Antibiotics: Anti-infectives (From admission, onward)   Start     Dose/Rate Route Frequency Ordered Stop   03/05/21 1030  vancomycin (VANCOREADY) IVPB 750 mg/150 mL        750 mg 150 mL/hr over 60 Minutes Intravenous Every  24 hours 03/05/21 0931 03/06/21 2359   03/01/21 1000  vancomycin (VANCOREADY) IVPB 1000 mg/200 mL  Status:  Discontinued        1,000 mg 200 mL/hr over 60 Minutes Intravenous Every 24 hours 02/28/21 0907 03/05/21 0931   02/28/21 1000  vancomycin (VANCOREADY) IVPB 1250 mg/250 mL        1,250 mg 166.7 mL/hr over 90 Minutes Intravenous  Once 02/28/21 0907 02/28/21 1214       Time spent: 20 minutes    Erin Hearing ANP  [facility] 7 am - 330 pm/M-F for direct patient care and secure chat Please refer to Amion for contact info 29  days

## 2021-03-06 NOTE — TOC Progression Note (Addendum)
Transition of Care [facility]) - Progression Note    Patient Details  Name: Joshua [name] MRN: [id number] Date of Birth: 12-22-1981  Transition of Care [facility]) CM/SW [place], RN Phone Number: 03/06/2021, 10:52 AM  Clinical Narrative:    Case management spoke with Erin Hearing, NP and the patient will be ready for discharge tomorrow around mid-day.  Prescriptions will be sent through [place] and prescriptions printed as well to have family take to the [facility] in [place], [place].  I spoke with Lollie Marrow, family on the phone regarding discharge plan for home health agencies that will be providing care at the mother's home.  Lollie Marrow, family plans to bring the patient's transfer wheelchair and will be driving the patient to the mother's home in [place].  [facility] is planning to retrieve the old hospital bed from the brother's home on Monday, and the new home health [place] will be delivering the hospital bed on this Friday to the mother's home in [facility].  The family was given the [facility] number to contact regarding deliver time of the hospital bed and hoyer lift - [phone].  I spoke with Judson Roch, CM with [facility] and the tube feeding and pump will be delivered to the patient's home in [place].  [facility] will be providing home health services including RN, aide, and MSW and are aware that patient is discharging home on Friday, 03/07/2021.  The patient's family, Darrick Penna will be bringing the transfer wheelchair from home to the nursing unit when patient is able to discharge around mid-day tomorrow.  The patient's family and Erin Hearing, NP is aware of discharge time and planning.  03/06/2021 - Spoke with Tony, [place] and patient's Medicaid is not active with [facility] and unable to fill medications through [facility] [place] [facility].  The patient has existing [facility] Medicaid card and Erin Hearing, NP left printed prescriptions at the bedside for patient to take  upon discharge to be filled at the [facility] in [place].  I called and spoke with the patient's family, Lollie Marrow, on the phone and she is going to make sure patient's additional medications are taken to the mother's home along with the printed prescriptions at the bedside.  Expected Discharge Plan: [place] Barriers to Discharge: Continued Medical Work up  Expected Discharge Plan and Services Expected Discharge Plan: [place] In-house Referral: Clinical Social Work Discharge Planning Services: CM Consult Post Acute Care Choice: [place] arrangements for the past 2 months: [place] (Discharging home to [place], [place])                 DME Arranged: Hospital bed (Specialty wheelchair thru - [place] in [facility]) [place]:  Richardson Landry Ward, ATT for specialty wheelchair - [phone];  [facility] in [place], [place]) Date DME Agency Contacted: 02/28/21 Time DME Agency Contacted: [phone] Representative spoke with at DME Agency: Richardson Landry Ward ATT - [phone] HH Arranged: RN,PT,OT,IV Antibiotics,Nurse's Aide [place] Agency: Tony Date [place]: 02/25/21   Representative spoke with at [place]: [name], [facility] at [place] in [phone]   Social Determinants of Health ([place]) Interventions    Readmission Risk Interventions Readmission Risk Prevention Plan 02/24/2021 07/04/2020  Transportation Screening Complete Complete  PCP or Specialist Appt within 5-7 Days - Complete  PCP or Specialist Appt within 3-5 Days Complete -  Home Care Screening -  Complete  Medication Review (RN CM) - Complete  HRI or Home Care Consult Complete -  Social Work Consult for Recovery Care Planning/Counseling Complete -  Palliative Care Screening Complete -  Medication Review Press photographer) Complete -  Some recent data might be hidden

## 2021-03-06 NOTE — Progress Notes (Signed)
  Speech Language Pathology Treatment: Dysphagia  Patient Details Name: Joshua [name] MRN: [id number] DOB: [birth year] Today's Date: 03/06/2021 Time: 1150-1205 SLP Time Calculation (min) (ACUTE ONLY): 15 min  Assessment / Plan / Recommendation Clinical Impression  Followed up for diet tolerance since diet advancement to mechanical soft textures with continued thin liquids. Pt often exhibits mild oral defensiveness initially with staff and SLP. He shows good oral acceptance of POs with finger foods and use of straw, became agreeable to assisted self feeding of puree snacks and self fed regular texture snacks. Prolonged mastication exhibited as well as impulsivity in bite size. Pt with delayed cough x1, noted to be strong. Pt consumed thin liquids via straws without overt s/sx of aspiration. Continue mechanical soft and thin liquid diet with PEG for nutritional support. No further ST needs identified.    HPI HPI: HPI: Joshua [name] is a 40 y.o. 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 Presents today by EMS for concern for seizures. Patient at baseline is nonverbal with right hemiplegia and requires full assistance with ADLs, and is being cared for at home by family. CT of head without acute intracranial abnormality. 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 Chest x ray with mild hazy airspace opacity within the right mid and lower lung which could be due to atelectasis and/or infectious etiology.  Pt has been in hospital for 21 days - pt now has a PEG.      SLP Plan  All goals met       Recommendations  Diet recommendations: Dysphagia 3 (mechanical soft);Thin  liquid Liquids provided via: Straw Medication Administration: Via alternative means Supervision: Staff to assist with self feeding;Intermittent supervision to cue for compensatory strategies Compensations: Slow rate;Small sips/bites;Minimize environmental distractions;Follow solids with liquid Postural Changes and/or Swallow Maneuvers: Seated upright 90 degrees;Upright 30-60 min after meal                Oral Care Recommendations: Oral care BID Follow up Recommendations: 24 hour supervision/assistance SLP Visit Diagnosis: Dysphagia, oropharyngeal phase (R13.12) Plan: All goals met       GO                Hayden Rasmussen MA, CCC-SLP Acute Rehabilitation Services   03/06/2021, 12:06 PM

## 2021-03-07 LAB — GLUCOSE, CAPILLARY
Glucose-Capillary: 112 mg/dL — ABNORMAL HIGH (ref 70–99)
Glucose-Capillary: 126 mg/dL — ABNORMAL HIGH (ref 70–99)
Glucose-Capillary: 90 mg/dL (ref 70–99)
Glucose-Capillary: 88 mg/dL (ref 70–99)

## 2021-03-07 NOTE — TOC Transition Note (Addendum)
Transition of Care [facility]) - CM/SW Discharge Note   Patient Details  Name: Joshua [name] MRN: [id number] Date of Birth: [birth year]  Transition of Care [facility]) CM/SW Contact:  Curlene Labrum, RN Phone Number: 03/07/2021, 10:20 AM   Clinical Narrative:    Case management spoke with Joshua [name], Joshua [name] with Joshua [name] and the patient has a purchased hospital bed through the [employer] already at the sister's home that family plans to move to the mother's home in [place].  Joshua [name], CM and Joshua at Home explained to the family that since the bed was purchased that they would be responsible to move the owned hospital bed themselves and family was made aware this morning during the conversation with the dme company.  I called and spoke with Joshua [name], sister on the phone and she is aware that the patient is going home with Joshua [name].  She plans to purchase a pill organizer for the family.  I made her aware that Joshua [name] will be providing home health and will follow up the first of the week with the family for initial assessment visit.  I spoke with Joshua [name] on the phone and again made her aware that the family is responsible for moving the hospital bed from her home to the mother's home today for patient's use.  She will be present at the home when the patient arrives by car.  Joshua [name], sister was also made aware that the patient's Enteral feeds were delivered to the patient's home through [employer].  I spoke with Joshua [name], Pharmacist with Joshua [name] and Joshua [name] placed to fill the [name] here at [name] and delivered to the patient's room by 1100 today prior to the patient's discharge to home.  Joshua [name], RNCM supervisor of [facility] is aware that MATCH was placed to fill discharge [name].  Nursing will call Joshua [name], family member at [phone] or [phone] when the patient is ready for discharge to home today.  Please send signed prescriptions home with the family as well that are present in  the hard chart.    03/07/2021 1500 - Patient's family is expected to arrive this evening and transport the patient home to [place] with the patient's mother Joshua [name].  Discharge Summary was faxed to home health agency to fax# [phone] for attention to Joshua [name].     Final next level of care: [place] Barriers to Discharge: Continued Medical Work up   Patient Goals and CMS Choice Patient states their goals for this hospitalization and ongoing recovery are:: Patient's mother agreeable to taking patient home with home health services. CMS Medicare.gov Compare Post Acute Care list provided to:: Patient Represenative (must comment) (Patient's mother - [phone]) Choice offered to / list presented to : Parent  Discharge Placement                       Discharge Plan and Services In-house Referral: Clinical Social Work Discharge Planning Services: CM Consult Post Acute Care Choice: [place]          DME Arranged: Hospital bed (Specialty wheelchair thru - [place] in [facility]) [place]:  Joshua [name], ATT for specialty wheelchair - [phone];  Joshua at [place] in [place], [place]) Date DME Agency Contacted: 02/28/21 Time DME Agency Contacted: [phone] Representative spoke with at DME Agency: Joshua [name] ATT - [phone] [facility] Arranged: [place]: [place] Date [place]: 02/25/21  Representative spoke with at [place]: Joshua [name] at [facility] in [employer] [id number]  Social Determinants of Health (Joshua [name]) Interventions     Readmission Risk Interventions Readmission Risk Prevention Plan 02/24/2021 07/04/2020  Transportation Screening Complete Complete  PCP or Specialist Appt within 5-7 Days - Complete  PCP or Specialist Appt within 3-5 Days Complete -  Home Care Screening - Complete  Medication Review (RN CM) - Complete  HRI or Home Care  Consult Complete -  Social Work Consult for Recovery Care Planning/Counseling Complete -  Palliative Care Screening Complete -  Medication Review Press photographer) Complete -  Some recent data might be hidden

## 2021-03-07 NOTE — Progress Notes (Signed)
Occupational Therapy Treatment Patient Details Name: Joshua [name] MRN: [id number] DOB: 09/22/1981 Today's Date: 03/07/2021    History of present illness Pt is a 40 y.o. male who presents with concerns for seizures. CT negative for acute intracranial abnormalities. Found with new chronic encephalopathy/suspected anoxic brain injury. 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   OT comments  Pt seen in conjunction with PT to collaborate on car transfer as pt to DC to [facility] to be with his mother. Pt received supine in bed with NT assisting pt with dressing pt continues to require total A for all dressing tasks/ADLs. Pt completed AP transfer into w/c with total A +2, utilized bed sheet and bed pads to scoot hips backwards. Attempted education on AP transfer in room however family unreceptive to education.  Pt transported to car with total A where dependent squat pivot transfer was performed into car going towards pts R side. Pt positioned in car in upright position with seat belt on, arm rest down on L side with pillow positioned on pts R side for lateral supports. Education provided to pts family on stopping every hour to let pt rest in side lying d/t decreased sitting tolerance. Education and demonstration explained to pts family on how to transfer pt out of car and into w/c with family members verbalizing understanding. Second option of calling fire dept to assist with transfer was provided to pts family members.  Pt would continue to benefit from skilled occupational therapy while admitted and after d/c to address the below listed limitations in order to improve overall functional mobility and facilitate independence with BADL participation. DC plan remains appropriate, will follow acutely per POC.    Follow Up Recommendations  Home health OT;Supervision/Assistance - 24  hour;Other (comment) ([place] aide)    Equipment Recommendations  Other (comment) (slideboard)    Recommendations for Other Services      Precautions / Restrictions Precautions Precautions: Fall Precaution Comments: seizures; at risk for skin breakdown and contractures Restrictions Weight Bearing Restrictions: No       Mobility Bed Mobility Overal bed mobility: Needs Assistance Bed Mobility: Rolling           General bed mobility comments: total A +2 to roll R<>L for Ub/ LB dressing    Transfers Overall transfer level: Needs assistance Equipment used: 2 person hand held assist Transfers: Anterior-Posterior Transfer;Squat Pivot Transfers     Squat pivot transfers: Total assist;+2 safety/equipment Anterior-Posterior transfers: Total assist;+2 physical assistance;+2 safety/equipment   General transfer comment: AP transfer from EOB>w/c using bed pad and sheet to scoot hips posteriorly into w/c, total A +2 for safety to squat pivot from w/c> car. education provided to family members however family not recetive to education. family members also noted to be unwilling to assist with transfer training, however verbal and visual demonstration was provided on safe transfer techinque out of car. positioned pt in car upright in back seat with seat belt donned on passengers side with arm rest to pts L side and pillow placed on R side for lateral support. Education provided on stopping every 1 hour to allow pt to rest in sidelying for 30 mins d/t pt decreased sitting tolerance. family members verbalized understanding.    Balance Overall balance assessment: Needs assistance Sitting-balance support: No upper extremity supported;Feet supported   Sitting balance - Comments: pt seated in back seat of car with  pillow positioned on pts R side with L arm rest supporting pt on L side. education provided on having second family member sit with pt for supervision and applied child lock to door for  safety                                   ADL either performed or assessed with clinical judgement   ADL Overall ADL's : At baseline                                       General ADL Comments: pt currently dependent for all ADLS and functional mobility including pivot transfer to car     Vision       Perception     Praxis      Cognition Arousal/Alertness: Awake/alert Behavior During Therapy: Flat affect Overall Cognitive Status: History of cognitive impairments - at baseline                                          Exercises     Shoulder Instructions       General Comments pts family members were provided extensive education on poisitoning of pt in back seat as well as safe transfer technique for how to get pt out of car. Reinfroced importance of having pt take sidelying rest breaks d/t decresed sitting tolerance with family members verbalizing understanding. issued family memebrs gait belt and provided education on using gait belt for transfer out of car. pt with mulitple chuck pads /brief to manage incontinence in car. education provided on second option of using local fire dept to assist with transporting pt out of car if needed.    Pertinent Vitals/ Pain       Pain Assessment: Faces Faces Pain Scale: Hurts little more Pain Location: generalized with mobility Pain Descriptors / Indicators: Discomfort;Grimacing;Crying;Moaning Pain Intervention(s): Limited activity within patient's tolerance;Monitored during session;Repositioned  Home Living                                          Prior Functioning/Environment              Frequency  Min 2X/week        Progress Toward Goals  OT Goals(current goals can now be found in the care plan section)  Progress towards OT goals: Progressing toward goals  Acute Rehab OT Goals Patient Stated Goal: none stated OT Goal Formulation: Patient unable to  participate in goal setting Time For Goal Achievement: 03/19/21 Potential to Achieve Goals: [place] Discharge plan remains appropriate;Frequency remains appropriate    Co-evaluation    PT/OT/SLP Co-Evaluation/Treatment: Yes Reason for Co-Treatment: Complexity of the patient's impairments (multi-system involvement);For patient/therapist safety;Necessary to address cognition/behavior during functional activity;To address functional/ADL transfers;Other (comment) (car transfer)   OT goals addressed during session: ADL's and self-care;Other (comment) (car transfer; family education)      AM-PAC OT "6 Clicks" Daily Activity     Outcome Measure   Help from another person eating meals?: Total Help from another person taking care of personal grooming?: Total Help from another person toileting, which includes using toliet, bedpan, or  urinal?: Total Help from another person bathing (including washing, rinsing, drying)?: Total Help from another person to put on and taking off regular upper body clothing?: Total Help from another person to put on and taking off regular lower body clothing?: Total 6 Click Score: 6    End of Session Equipment Utilized During Treatment: Gait belt;Other (comment) (w/c)  OT Visit Diagnosis: Other abnormalities of gait and mobility (R26.89)   Activity Tolerance Patient tolerated treatment well   Patient Left Other (comment) (in car with family members)   Nurse Communication Mobility status        Time: 1444-1550 OT Time Calculation (min): 66 min  Charges: OT General Charges $OT Visit: 1 Visit OT Treatments $Therapeutic Activity: 23-37 mins  Harley Alto., COTA/L Acute Rehabilitation Services [phone]    Precious Haws 03/07/2021, 4:07 PM

## 2021-03-07 NOTE — Discharge Summary (Addendum)
Physician Discharge Summary  Joshua [name] [id number] DOB: 1981-06-13 DOA: 02/04/2021  PCP: Patient, [name]  Admit date: 02/04/2021   Discharge date: 03/07/2021  Time spent: 45 minutes  Recommendations for Outpatient Follow-up:  1. Patient will discharge home with family to [place] by private vehicle 2. Patient's family has been instructed to contact primary care physician Dr. Juleen China to arrange outpatient follow-up after hospitalization 3. Patient will be receiving home health services with RN, aide and social worker with [facility] 4. Patient will be provided a hospital bed and [facility] lift through [facility] 5. Patient's tube feedings and feeding pump will be provided by [facility] 6. 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  Patient has also been prescribed a D3 mechanical soft diet and liquids after follow-up swallowing evaluation on 3/10  Filed Weights   03/04/21 0500 03/05/21 0025 03/06/21 0422  Weight: 48.5 kg 48.1 kg 47.6 kg    History of present illness:  40 year old male with a history of intracranial hemorrhage in 08/2019 requiring a hemicraniectomy and evacuation was discharged home in May 2021. The patient has subsequently had multiple seizures with multiple ER visits for them. He presented on 02/04/2021 due to family's concern that he was having another seizure. He had a  leftward gaze which was noted in the ED as well. Despite receiving Ativan, it did not improve and therefore he was given an IV Keppra load.  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 with 50 mg at HS. 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  Cardiology consulted for TEE but after consulting cardiologist reviewed echo did not feel that there were actual vegetations.  ID recommended a total of 7 days of antibiotics with last dose on 3/10  HTN with H/o PRES -Had issues with suboptimal blood pressure readings during the hospitalization therefore Norvasc decreased to 5 mg daily; continue hydralazine and Metoprolol   Severe protein-calorie malnutrition, underweight Dysphagia Body mass index is 13.1 kg/m. - He has PEG tube and is tolerating tube feeds -As of 3/10 patient has been reevaluated by speech and language  and has been advanced to mechanical soft diet with thin  liquids with focus on finger foods. - I have been speaking with nursing about making sure the abdominal binder is covering his PEG to protect it. Nutrition Problem: Moderate Malnutrition Etiology: chronic illness (seizures, dysphagia) Signs/Symptoms: mild muscle depletion,mild fat depletion,severe muscle depletion,moderate muscle depletion,percent weight loss Percent weight loss: 23.5 % (23.5% x 10 months (based on most recent weight reading) or 31.6% x 9 months if using admission weight.) Interventions: Prostat,Tube feeding    Other problems: CKD 3 - Creatinine stable  Procedures: Echocardiogram 1. Limited study that was ended early due to patient combativeness 2. Left ventricular ejection fraction, by estimation, is 70 to 75%. The left ventricle has hyperdynamic function. The left ventricle has no regional wall motion abnormalities. There is mild left ventricular hypertrophy. Left ventricular diastolic function could not be evaluated. 3. Right ventricular systolic function is normal. The right ventricular size is normal. 4. There is thickening of the anterior mitral valve leaflet with possible small vegetation.. The mitral valve is abnormal. Trivial mitral valve regurgitation. 5. The aortic valve appears thickened and echogenic and there may be associated vegetation. The aortic valve is abnormal. Aortic valve regurgitation is not visualized. 6. The inferior vena cava is normal in size with greater than 50% respiratory variability, suggesting right atrial pressure of 3 mmHg.   Consultations:  Neurology   Discharge Exam: Patient was seen at bedside.  He appears stable,  alert and active,  smiling.  Vitals:   03/07/21 0432 03/07/21 0805  BP: 137/84 (!) 149/99  Pulse: 81 79  Resp: 18   Temp: 98 F (36.7 C) 98.3 F (36.8 C)  SpO2: 98% 99%   Constitutional: Alert, calm, no acute distress Respiratory: Posterior lung sounds clear to auscultation and he remained stable on room  air Cardiovascular: Heart sounds are S1-S2, no peripheral edema, skin warm and dry, pulse regular Abdomen: Soft nontender with normoactive bowel sounds.  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 Appears to have normal tone of left upper extremity which he uses purposefully Neurologic: Cranial nerves II through XII appear to be grossly intact -moves left upper extremity with purpose dense hemiplegia right side Psychiatric: Awakened, affect more flat and not smiling today.  Aphasic so unable to accurately assess orientation  Discharge Instructions    Allergies as of 03/07/2021   No Known Allergies     Medication List    STOP taking these medications   cloNIDine 0.3 mg/24hr patch Commonly known as: CATAPRES - Dosed in mg/24 hr     TAKE these medications   acetaminophen 325 MG tablet Commonly known as: TYLENOL Place 2 tablets (650 mg total) into feeding tube every 4 (four) hours as needed for mild pain (temp > 100.5).   amLODipine 5 MG tablet Commonly known as: NORVASC Place 1 tablet (5 mg total) into feeding tube daily. What changed:   medication strength  how much to take  when to take this   feeding supplement (OSMOLITE 1.5 CAL) Liqd Place 474 mLs into feeding tube 3 (three) times daily. What changed: when to take this   free water Soln Place 200 mLs into feeding tube every 4 (four) hours. What changed:   how much to take  when to take this  additional instructions   hydrALAZINE 25 MG tablet Commonly known as: APRESOLINE Place 1 tablet (25 mg total) into feeding tube every 8 (eight)  hours.   levETIRAcetam 100 MG/ML solution Commonly known as: KEPPRA Place 10 mLs (1,000 mg total) into feeding tube every 12 (twelve) hours. What changed:   how much to take  when to take this   LORazepam 2 MG/ML concentrated solution Commonly known as: ATIVAN Place 0.5 mLs (1 mg  total) into feeding tube daily as needed for seizure.   metoprolol tartrate 50 MG tablet Commonly known as: LOPRESSOR Place 1 tablet (50 mg total) into feeding tube 2 (two) times daily.   naltrexone 50 MG tablet Commonly known as: DEPADE Place 0.5 tablets (25 mg total) into feeding tube daily.   polyethylene glycol 17 g packet Commonly known as: MIRALAX / GLYCOLAX Place 17 g into feeding tube See admin instructions. Mix 17 grams into 4-8 ounces of water and administer, per tube, two times a day What changed: additional instructions   QUEtiapine 50 MG tablet Commonly known as: SEROQUEL Place 1 tablet (50 mg total) into feeding tube at bedtime.   QUEtiapine 25 MG tablet Commonly known as: SEROQUEL Place 1 tablet (25 mg total) into feeding tube daily.            Durable Medical Equipment  (From admission, onward)         Start     Ordered   03/05/21 1402  For home use only DME Other see comment  Once       Comments: Slide board  Question:  Length of Need  Answer:  Lifetime   03/05/21 1403   03/05/21 1402  For home use only DME lightweight manual wheelchair with seat cushion  Once       Comments: Patient suffers from Right sided hemiparesis which impairs their ability to perform daily activities like [id number] in the home.  A walking [id number] will not resolve  issue with performing activities of daily living. A wheelchair will allow patient to safely perform daily activities. Patient is not able to propel themselves in the home using a standard weight wheelchair due to [id number]. Patient can self propel in the lightweight wheelchair. Length of need lifetime. Accessories: elevating leg rests (ELRs), wheel locks, extensions and anti-tippers.  I NEED a TRANSFER wheelchair that leans back.   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   Why: [facility] will provide home health services including RN, aide, and MSW within 24-48 hours after your discharge to home.  Please call  [facility] at [phone]. Contact information: [place] [address] [phone]        Dr. Juleen China. Schedule an appointment as soon as possible for a visit.   Why: Please call and make an appointment for a hospital follow up in the next 7-10 days after discharge to home in [place], [place]. Contact information: [phone]       [facility] Follow up.   Why: [facility] will be providing you with a hospital bed and hoyer lift.  If you need assistance with repair or delivery - please call the above number to contact. Contact information: [phone]       [facility] Follow up.   Why: [facility] will providing you with Enteral Feeds for you PEG tube feedings.  Please call [phone] if you have any needs.       [facility] Follow up.   Why: [facility] will be providing a slide board to the hospital room for discharge to home on friday to assist with transfers. Contact information: [phone] [place] [address] [phone]                The results of significant diagnostics from this hospitalization (including imaging, microbiology, ancillary and laboratory) are listed below for reference.    Significant Diagnostic Studies: DG CHEST PORT 1 VIEW  Result Date: 02/27/2021 CLINICAL DATA:  Fever EXAM: PORTABLE CHEST 1 VIEW COMPARISON:  02/04/2021 FINDINGS: Minimal scarring or atelectasis at the left base. There is no edema, consolidation, effusion, or pneumothorax. Normal heart size for technique. Unremarkable aortic and hilar contours. IMPRESSION: No focal pneumonia. Electronically Signed   By: Monte Fantasia M.D.   On: 02/27/2021 10:56   ECHOCARDIOGRAM COMPLETE  Result Date: 02/28/2021    ECHOCARDIOGRAM REPORT   Patient Name:   Joshua [name] Date of Exam: 02/28/2021 Medical Rec #:  [id number]     Height:       72.0 in Accession #:    [id number]    Weight:       111.1 lb Date of Birth:  1981-04-15    BSA:          1.660 m Patient Age:    19 years      BP:           106/82 mmHg Patient Gender: M             HR:           76 bpm. Exam Location:  Inpatient Procedure: 2D Echo, 3D Echo, Cardiac Doppler, Color Doppler and Strain Analysis Indications:    Bacteremia  History:        Patient has prior history of Echocardiogram examinations, most                 recent 09/16/2019.  Sonographer:    Luisa Hart RDCS Referring Phys: [id number] Leisa Lenz ELLIS  Sonographer Comments: Difficult to assess short axis due to patient combativenes. Suspicious for vegetation on MV. IMPRESSIONS  1. Limited study that was ended early due to patient combativeness  2. Left ventricular ejection fraction, by estimation, is 70 to 75%. The left ventricle has hyperdynamic function. The left ventricle has no regional wall motion abnormalities. There is mild left ventricular hypertrophy. Left ventricular diastolic function could not be evaluated.  3. Right ventricular systolic function is normal. The right  ventricular size is normal.  4. There is thickening  of the anterior mitral valve leaflet with possible small vegetation.. The mitral valve is abnormal. Trivial mitral valve regurgitation.  5. The aortic valve appears thickened and echogenic and there may be associated vegetation. The aortic valve is abnormal. Aortic valve regurgitation is not visualized.  6. The inferior vena cava is normal in size with greater than 50% respiratory variability, suggesting right atrial pressure of 3 mmHg. Comparison(s): Changes from prior study are noted. 09/16/2019: LVEF 70-75%, mild MR. Conclusion(s)/Recommendation(s): Findings concerning for mitral and possibly aortic valve vegetations, would recommend Transesophageal Echocardiogram for further evaluation. FINDINGS  Left Ventricle: Left ventricular ejection fraction, by estimation, is 70 to 75%. The left ventricle has hyperdynamic function. The left ventricle has no regional wall motion abnormalities. The left ventricular internal cavity size was normal in size. There is mild left ventricular hypertrophy. Left ventricular diastolic function could not be evaluated. Right Ventricle: The right ventricular size is normal. No increase in right ventricular wall thickness. Right ventricular systolic function is normal. Left Atrium: Left atrial size was normal in size. Right Atrium: Right atrial size was normal in size. Pericardium: There is no evidence of pericardial effusion. Mitral Valve: There is thickening of the anterior mitral valve leaflet with possible small vegetation. The mitral valve is abnormal. Trivial mitral valve regurgitation. Tricuspid Valve: The tricuspid valve is not well visualized. Tricuspid valve regurgitation is not demonstrated. Aortic Valve: The aortic valve appears thickened and echogenic and there may be associated vegetation. The aortic valve is abnormal. Aortic valve regurgitation is not visualized. Pulmonic Valve: The pulmonic valve was not well visualized.  Pulmonic valve regurgitation is not visualized. Aorta: The aortic root and ascending aorta are structurally normal, with no evidence of dilitation. Venous: The inferior vena cava is normal in size with greater than 50% respiratory variability, suggesting right atrial pressure of 3 mmHg. IAS/Shunts: No atrial level shunt detected by color flow Doppler.  LEFT VENTRICLE PLAX 2D LVIDd:         4.00 cm LVIDs:         2.30 cm LV PW:         1.20 cm LV IVS:        1.40 cm LVOT diam:     2.10 cm LVOT Area:     3.46 cm  RIGHT VENTRICLE RV S prime:     15.60 cm/s TAPSE (M-mode): 2.5 cm LEFT ATRIUM         Index      RIGHT ATRIUM          Index LA diam:    1.70 cm 1.02 cm/m RA Area:     9.71 cm                                RA Volume:   17.10 ml 10.30 ml/m   AORTA Ao Root diam: 4.20 cm Ao Asc diam:  3.20 cm  SHUNTS Systemic Diam: 2.10 cm Lyman Bishop MD Electronically signed by Lyman Bishop MD Signature Date/Time: 02/28/2021/11:56:30 AM    Final     Microbiology: Recent Results (from the past 240 hour(s))  Culture, Urine     Status: Abnormal   Collection Time: 02/27/21  7:49 AM   Specimen: Urine, Catheterized  Result Value Ref Range Status   Specimen Description URINE, CATHETERIZED  Final   Special Requests   Final    NONE Performed at [facility], [address], [place], [place] [address]  Culture MULTIPLE SPECIES PRESENT, SUGGEST RECOLLECTION (A)  Final   Report Status 02/28/2021 FINAL  Final  Culture, blood (routine x 2)     Status: Abnormal   Collection Time: 02/27/21  8:50 AM   Specimen: BLOOD RIGHT HAND  Result Value Ref Range Status   Specimen Description BLOOD RIGHT HAND  Final   Special Requests   Final    BOTTLES DRAWN AEROBIC AND ANAEROBIC Blood Culture results may not be optimal due to an inadequate volume of blood received in culture bottles   Culture  Setup Time   Final    GRAM POSITIVE COCCI IN CLUSTERS IN BOTH AEROBIC AND ANAEROBIC BOTTLES Organism ID to follow CRITICAL  RESULT CALLED TO, READ BACK BY AND VERIFIED WITH: PHARMD T.JACKIE AT [id number] ON 02/28/2021 BY T.SAAD    Culture (A)  Final    STAPHYLOCOCCUS EPIDERMIDIS THE SIGNIFICANCE OF ISOLATING THIS ORGANISM FROM A SINGLE SET OF BLOOD CULTURES WHEN MULTIPLE SETS ARE DRAWN IS UNCERTAIN. PLEASE NOTIFY THE MICROBIOLOGY DEPARTMENT WITHIN ONE WEEK IF SPECIATION AND SENSITIVITIES ARE REQUIRED. Performed at [facility], [place] [address], [place], [place] [address]    Report Status 03/03/2021 FINAL  Final  Blood Culture ID Panel (Reflexed)     Status: Abnormal   Collection Time: 02/27/21  8:50 AM  Result Value Ref Range Status   Enterococcus faecalis NOT DETECTED NOT DETECTED Final   Enterococcus Faecium NOT DETECTED NOT DETECTED Final   Listeria monocytogenes NOT DETECTED NOT DETECTED Final   Staphylococcus species DETECTED (A) NOT DETECTED Final    Comment: CRITICAL RESULT CALLED TO, READ BACK BY AND VERIFIED WITH: PHARM D T.JACKIE AT [id number] ON 02/28/2021 BY T.SAAD.    Staphylococcus aureus (BCID) NOT DETECTED NOT DETECTED Final   Staphylococcus epidermidis DETECTED (A) NOT DETECTED Final    Comment: Methicillin (oxacillin) resistant coagulase negative staphylococcus. Possible blood culture contaminant (unless isolated from more than one blood culture draw or clinical case suggests pathogenicity). No antibiotic treatment is indicated for blood  culture contaminants. CRITICAL RESULT CALLED TO, READ BACK BY AND VERIFIED WITH: PHARM D T.JACKIE AT [id number] ON 02/28/2021 BY T.SAAD.    Staphylococcus lugdunensis NOT DETECTED NOT DETECTED Final   Streptococcus species NOT DETECTED NOT DETECTED Final   Streptococcus agalactiae NOT DETECTED NOT DETECTED Final   Streptococcus pneumoniae NOT DETECTED NOT DETECTED Final   Streptococcus pyogenes NOT DETECTED NOT DETECTED Final   A.calcoaceticus-baumannii NOT DETECTED NOT DETECTED Final   Bacteroides fragilis NOT DETECTED NOT DETECTED Final   Enterobacterales NOT  DETECTED NOT DETECTED Final   Enterobacter cloacae complex NOT DETECTED NOT DETECTED Final   Escherichia coli NOT DETECTED NOT DETECTED Final   Klebsiella aerogenes NOT DETECTED NOT DETECTED Final   Klebsiella oxytoca NOT DETECTED NOT DETECTED Final   Klebsiella pneumoniae NOT DETECTED NOT DETECTED Final   Proteus species NOT DETECTED NOT DETECTED Final   Salmonella species NOT DETECTED NOT DETECTED Final   Serratia marcescens NOT DETECTED NOT DETECTED Final   Haemophilus influenzae NOT DETECTED NOT DETECTED Final   Neisseria meningitidis NOT DETECTED NOT DETECTED Final   Pseudomonas aeruginosa NOT DETECTED NOT DETECTED Final   Stenotrophomonas maltophilia NOT DETECTED NOT DETECTED Final   Candida albicans NOT DETECTED NOT DETECTED Final   Candida auris NOT DETECTED NOT DETECTED Final   Candida glabrata NOT DETECTED NOT DETECTED Final   Candida krusei NOT DETECTED NOT DETECTED Final   Candida parapsilosis NOT DETECTED NOT DETECTED Final   Candida tropicalis NOT DETECTED NOT  DETECTED Final   Cryptococcus neoformans/gattii NOT DETECTED NOT DETECTED Final   Methicillin resistance mecA/C DETECTED (A) NOT DETECTED Final    Comment: CRITICAL RESULT CALLED TO, READ BACK BY AND VERIFIED WITH: PHARM D T.JACKIE AT [id number] ON 02/28/2021 BY T.SAAD. Performed at [facility], [employer] [address], [place], [place] [address]   Culture, blood (routine x 2)     Status: Abnormal   Collection Time: 02/27/21  8:57 AM   Specimen: BLOOD RIGHT WRIST  Result Value Ref Range Status   Specimen Description BLOOD RIGHT WRIST  Final   Special Requests   Final    AEROBIC BOTTLE ONLY Blood Culture results may not be optimal due to an inadequate volume of blood received in culture bottles   Culture  Setup Time   Final    GRAM POSITIVE COCCI IN CLUSTERS AEROBIC BOTTLE ONLY CRITICAL VALUE NOTED.  VALUE IS CONSISTENT WITH PREVIOUSLY REPORTED AND CALLED VALUE.    Culture (A)  Final    STAPHYLOCOCCUS HOMINIS THE  SIGNIFICANCE OF ISOLATING THIS ORGANISM FROM A SINGLE SET OF BLOOD CULTURES WHEN MULTIPLE SETS ARE DRAWN IS UNCERTAIN. PLEASE NOTIFY THE MICROBIOLOGY DEPARTMENT WITHIN ONE WEEK IF SPECIATION AND SENSITIVITIES ARE REQUIRED. Performed at [facility], [place] [address] [place], [place] [address]    Report Status 03/01/2021 FINAL  Final     Labs: Basic Metabolic Panel: Recent Labs  Lab 03/01/21 0225 03/03/21 0945 03/04/21 0356  NA 136 138 137  K 4.1 4.1 4.1  CL 102 104 102  CO2 '27 24 27  '$ GLUCOSE 117* 110* 96  BUN 25* 26* 25*  CREATININE 1.33* 1.40* 1.33*  CALCIUM 9.4 9.5 9.6   Liver Function Tests: Recent Labs  Lab 03/01/21 0225 03/04/21 0356  AST 16 12*  ALT 14 13  ALKPHOS 60 61  BILITOT 0.6 0.6  PROT 6.6 6.6  ALBUMIN 3.1* 3.2*   No results for input(s): LIPASE, AMYLASE in the last 168 hours. No results for input(s): AMMONIA in the last 168 hours. CBC: Recent Labs  Lab 03/01/21 0225 03/04/21 0356  WBC 5.4 5.9  HGB 12.2* 12.2*  HCT 37.6* 38.0*  MCV 81.2 81.4  PLT 235 243   Cardiac Enzymes: No results for input(s): CKTOTAL, CKMB, CKMBINDEX, TROPONINI in the last 168 hours. BNP: BNP (last 3 results) No results for input(s): BNP in the last 8760 hours.  ProBNP (last 3 results) No results for input(s): PROBNP in the last 8760 hours.  CBG: Recent Labs  Lab 03/06/21 1534 03/06/21 2017 03/06/21 2359 03/07/21 0433 03/07/21 0803  GLUCAP 86 75 126* 112* 90    Agree with above nurse practitioner's note and discharge summary.  Patient is being discharged home with home services.   Signed:  Erin Hearing ANP [facility] 03/07/2021, 10:02 AM

## 2021-03-07 NOTE — Progress Notes (Signed)
Spoke with the family in regards to discharge. They will be here sometime this evening, did not specify a time. The family was educated over the phone about medications and explained the AVS to them.  I will reinforce the AVS and medications when they arrive to pick up the patient.

## 2021-03-07 NOTE — Progress Notes (Signed)
Physical Therapy Treatment Patient Details Name: Joshua [name] MRN: [id number] DOB: 07-Mar-1981 Today's Date: 03/07/2021    History of Present Illness Pt is a 40 y.o. male who presents with concerns for seizures. CT negative for acute intracranial abnormalities. Found with new chronic encephalopathy/suspected anoxic brain injury. 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    PT Comments    Patient seen in conjunction with OT to collaborate on car transfer as patient plans to d/c to [facility] to live with mother who plans to care for patient. On arrival, patient supine in bed with NT assisting patient with dressing. Completed AP transfer into w/c with totalA+2 with utilization of bed sheet and bed pads to scoot hips backwards into w/c. Attempted education on AP transfer in room for out of car transfer, however family not receptive of education or hands on learning. Patient transported to car via w/c. Performed totalA squat pivot towards patient's L side into car with +2 for safety. Positioned patient in car in upright position with seat belt on, arm rest down on L side and pillow positioned on R side for lateral support. Child lock applied to door for safety. Education provided to family members on importance of stopping every hour to allow patient to rest in sidelying for 30 minutes due to decreased sitting tolerance, family members verbalized understanding. Education and demonstration provided to patient's family members on how to transfer patient out of car and into w/c, family members verbalized understanding. Educated family on second option of use of local fire department to assist with out of car transfer if needed. Patient would benefit from continued skilled PT services in the acute setting to address listed deficits (see PT problem list). Discharge plan remains  appropriate.    Follow Up Recommendations  Other (comment);No PT follow up [facility] aide)     [facility] bed (slideboard)    Recommendations for Other Services       Precautions / Restrictions Precautions Precautions: Fall Precaution Comments: seizures; at risk for skin breakdown and contractures Restrictions Weight Bearing Restrictions: No    Mobility  Bed Mobility Overal bed mobility: Needs Assistance Bed Mobility: Rolling Rolling: Total assist;+2 for physical assistance;+2 for safety/equipment         General bed mobility comments: total A +2 to roll R<>L for Ub/ LB dressing    Transfers Overall transfer level: Needs assistance Equipment used: 2 person hand held assist Transfers: Anterior-Posterior Transfer;Squat Pivot Transfers     Squat pivot transfers: Total assist;+2 safety/equipment Anterior-Posterior transfers: Total assist;+2 physical assistance;+2 safety/equipment   General transfer comment: AP transfer from EOB>w/c using bed pad and sheet to scoot hips posteriorly into w/c, total A +2 for safety to squat pivot from w/c> car. Education provided to family members however family not receptive to education. Family members also noted to be unwilling to assist with transfer training, however verbal and visual demonstration was provided on safe transfer techinque out of car. Positioned patient in car upright in back seat with seat belt donned on passenger's side with arm rest to patient's L side and pillow placed on R side for lateral support. Education provided on stopping every 1 hour to allow patient to rest in sidelying for 30 mins due to patient's decreased sitting tolerance. Family members verbalized understanding.  Ambulation/Gait  Stairs             Wheelchair Mobility    Modified Rankin (Stroke Patients Only)       Balance Overall balance assessment: Needs assistance Sitting-balance support: No upper  extremity supported;Feet supported   Sitting balance - Comments: Patient seated in back seat of car with pillow positioned on patient's R side with L arm rest supporting patient on L side. Education provided on having second family member sit with patient for supervision and applied child lock to door for safety                                    Cognition Arousal/Alertness: Awake/alert Behavior During Therapy: Flat affect Overall Cognitive Status: History of cognitive impairments - at baseline                                        Exercises      General Comments General comments (skin integrity, edema, etc.): Patient's family members were provided extensive education on importance of positioning patient in back seat as well as safe transfer technique for how to get patient out of car. Reinforced importance of having patient take sidelying rest breaks due to decreased sitting tolerance with family members verbalizing understanding. Issued family members gait belt and provided education on using gait belt for transfer out of car. Patient with multiple chuck pads/brief to manage incontinence in car. Education provided on second option of using local fire department to assist with transporting patient out of car if needed.      Pertinent Vitals/Pain Pain Assessment: Faces Faces Pain Scale: Hurts little more Pain Location: generalized with mobility Pain Descriptors / Indicators: Discomfort;Grimacing;Crying;Moaning Pain Intervention(s): Monitored during session;Limited activity within patient's tolerance;Repositioned    Home Living                      Prior Function            PT Goals (current goals can now be found in the care plan section) Acute Rehab PT Goals Patient Stated Goal: none stated PT Goal Formulation: Patient unable to participate in goal setting Time For Goal Achievement: 03/19/21 Potential to Achieve Goals: Fair Progress  towards PT goals: Progressing toward goals    Frequency    Min 2X/week      PT Plan Current plan remains appropriate    Co-evaluation PT/OT/SLP Co-Evaluation/Treatment: Yes Reason for Co-Treatment: Complexity of the patient's impairments (multi-system involvement);Necessary to address cognition/behavior during functional activity;For patient/therapist safety;To address functional/ADL transfers PT goals addressed during session: Mobility/safety with mobility;Other (comment) (car transfer; family education) OT goals addressed during session: ADL's and self-care;Other (comment) (car transfer; family education)      AM-PAC PT "6 Clicks" Mobility   Outcome Measure  Help needed turning from your back to your side while in a flat bed without using bedrails?: Total Help needed moving from lying on your back to sitting on the side of a flat bed without using bedrails?: Total Help needed moving to and from a bed to a chair (including a wheelchair)?: Total Help needed standing up from a chair using your arms (e.g., wheelchair or bedside chair)?: Total Help needed to walk in hospital room?: Total Help needed climbing 3-5 steps with a railing? : Total 6 Click Score: 6  End of Session Equipment Utilized During Treatment: Gait belt Activity Tolerance: Patient tolerated treatment well Patient left: Other (comment) (in car with family members to transport home with mother in [facility]) Nurse Communication: Mobility status PT Visit Diagnosis: Muscle weakness (generalized) (M62.81)     Time: [id number] PT Time Calculation (min) (ACUTE ONLY): 66 min  Charges:  $Therapeutic Activity: 23-37 mins                     Kelani Robart A. Gilford Rile PT, DPT Acute Rehabilitation Services Pager [phone] Office [phone]    Linna Hoff 03/07/2021, 5:05 PM

## 2021-04-04 ENCOUNTER — Other Ambulatory Visit (HOSPITAL_COMMUNITY): Payer: Self-pay
